# Patient Record
Sex: Male | Born: 1955 | Race: Black or African American | Hispanic: No | Marital: Single | Smoking: Current every day smoker
Health system: Southern US, Community
[De-identification: ages and names within clinical notes are randomized; demographics above are authoritative.]

## PROBLEM LIST (undated history)

## (undated) DIAGNOSIS — N189 Chronic kidney disease, unspecified: Secondary | ICD-10-CM

## (undated) DIAGNOSIS — G709 Myoneural disorder, unspecified: Secondary | ICD-10-CM

## (undated) DIAGNOSIS — IMO0001 Reserved for inherently not codable concepts without codable children: Secondary | ICD-10-CM

## (undated) DIAGNOSIS — Z9289 Personal history of other medical treatment: Secondary | ICD-10-CM

## (undated) DIAGNOSIS — I739 Peripheral vascular disease, unspecified: Secondary | ICD-10-CM

## (undated) DIAGNOSIS — I10 Essential (primary) hypertension: Secondary | ICD-10-CM

## (undated) DIAGNOSIS — E785 Hyperlipidemia, unspecified: Secondary | ICD-10-CM

---

## 1997-06-24 HISTORY — PX: HERNIA REPAIR: SHX51

## 2004-05-24 ENCOUNTER — Emergency Department: Payer: Self-pay | Admitting: Emergency Medicine

## 2004-05-24 ENCOUNTER — Other Ambulatory Visit: Payer: Self-pay

## 2005-06-05 ENCOUNTER — Emergency Department: Payer: Self-pay | Admitting: Emergency Medicine

## 2005-06-06 ENCOUNTER — Ambulatory Visit: Payer: Self-pay | Admitting: Emergency Medicine

## 2005-12-16 ENCOUNTER — Emergency Department: Payer: Self-pay | Admitting: Emergency Medicine

## 2005-12-16 ENCOUNTER — Other Ambulatory Visit: Payer: Self-pay

## 2006-01-01 ENCOUNTER — Ambulatory Visit: Payer: Self-pay | Admitting: Internal Medicine

## 2006-02-09 ENCOUNTER — Emergency Department: Payer: Self-pay | Admitting: Emergency Medicine

## 2006-05-08 ENCOUNTER — Emergency Department: Payer: Self-pay | Admitting: Emergency Medicine

## 2006-09-09 ENCOUNTER — Ambulatory Visit: Payer: Self-pay | Admitting: General Practice

## 2006-12-27 ENCOUNTER — Inpatient Hospital Stay: Payer: Self-pay | Admitting: Internal Medicine

## 2006-12-27 ENCOUNTER — Other Ambulatory Visit: Payer: Self-pay

## 2013-05-06 ENCOUNTER — Emergency Department: Payer: Self-pay | Admitting: Emergency Medicine

## 2013-06-23 ENCOUNTER — Emergency Department: Payer: Self-pay | Admitting: Emergency Medicine

## 2013-11-04 ENCOUNTER — Emergency Department: Payer: Self-pay | Admitting: Emergency Medicine

## 2015-08-31 ENCOUNTER — Ambulatory Visit
Admission: RE | Admit: 2015-08-31 | Discharge: 2015-08-31 | Disposition: A | Discharge status: Home / Self Care | Payer: BLUE CROSS/BLUE SHIELD | Source: Ambulatory Visit | Attending: Family Medicine | Admitting: Family Medicine

## 2015-08-31 ENCOUNTER — Other Ambulatory Visit: Payer: Self-pay | Admitting: Family Medicine

## 2015-08-31 ENCOUNTER — Ambulatory Visit (INDEPENDENT_AMBULATORY_CARE_PROVIDER_SITE_OTHER): Payer: BLUE CROSS/BLUE SHIELD | Admitting: Family Medicine

## 2015-08-31 ENCOUNTER — Encounter: Payer: Self-pay | Admitting: Family Medicine

## 2015-08-31 VITALS — BP 132/82 | HR 81 | Temp 97.6°F | Resp 14 | Ht 69.25 in | Wt 130.2 lb

## 2015-08-31 DIAGNOSIS — Z125 Encounter for screening for malignant neoplasm of prostate: Secondary | ICD-10-CM

## 2015-08-31 DIAGNOSIS — Z720 Tobacco use: Secondary | ICD-10-CM

## 2015-08-31 DIAGNOSIS — Z79899 Other long term (current) drug therapy: Secondary | ICD-10-CM | POA: Diagnosis not present

## 2015-08-31 DIAGNOSIS — J449 Chronic obstructive pulmonary disease, unspecified: Secondary | ICD-10-CM | POA: Insufficient documentation

## 2015-08-31 DIAGNOSIS — R062 Wheezing: Secondary | ICD-10-CM

## 2015-08-31 DIAGNOSIS — I10 Essential (primary) hypertension: Secondary | ICD-10-CM | POA: Diagnosis not present

## 2015-08-31 DIAGNOSIS — Z114 Encounter for screening for human immunodeficiency virus [HIV]: Secondary | ICD-10-CM

## 2015-08-31 DIAGNOSIS — Z13220 Encounter for screening for lipoid disorders: Secondary | ICD-10-CM | POA: Diagnosis not present

## 2015-08-31 DIAGNOSIS — Z1159 Encounter for screening for other viral diseases: Secondary | ICD-10-CM | POA: Diagnosis not present

## 2015-08-31 DIAGNOSIS — Z1211 Encounter for screening for malignant neoplasm of colon: Secondary | ICD-10-CM

## 2015-08-31 DIAGNOSIS — R9389 Abnormal findings on diagnostic imaging of other specified body structures: Secondary | ICD-10-CM

## 2015-08-31 MED ORDER — LISINOPRIL 20 MG PO TABS: 20.0000 mg | ORAL_TABLET | Freq: Every day | ORAL | Status: DC

## 2015-08-31 MED ORDER — ALBUTEROL SULFATE (2.5 MG/3ML) 0.083% IN NEBU
2.5000 mg | INHALATION_SOLUTION | Freq: Once | RESPIRATORY_TRACT | Status: AC
  Administered 2015-08-31: 2.5 mg via RESPIRATORY_TRACT

## 2015-08-31 MED ORDER — UMECLIDINIUM-VILANTEROL 62.5-25 MCG/INH IN AEPB: 1.0000 | INHALATION_SPRAY | Freq: Every day | RESPIRATORY_TRACT | Status: DC

## 2015-08-31 MED ORDER — ASPIRIN EC 81 MG PO TBEC: 81.0000 mg | DELAYED_RELEASE_TABLET | Freq: Every day | ORAL | Status: DC

## 2015-08-31 MED ORDER — MOXIFLOXACIN HCL 400 MG PO TABS: 400.0000 mg | ORAL_TABLET | Freq: Every day | ORAL | Status: DC

## 2015-08-31 NOTE — Progress Notes (Signed)
Name: Ryan [name]   MRN: [id number]    DOB: 02/12/1956   Date:08/31/2015       Progress Note  Subjective  Chief Complaint  Chief Complaint  Patient presents with  . Hypertension  . Colonoscopy    Wants colonoscopy referral.    HPI  HTN: he has been out of bp medication for a couple of months, he states he knows when bp goes up because he gets dizzy. No chest pain or palpitation.   Wheezing: wife has noticed some wheezing at night, no cough or SOB. He has been a smoker for 30 years, he used to smoke heavier but is down to a quarter pack daily. He is willing to try quitting. He will try nicotine gum   Patient Active Problem List   Diagnosis Date Noted  . Hypertension, benign 08/31/2015  . Tobacco use 08/31/2015    History reviewed. No pertinent past surgical history.  Family History  Problem Relation Age of Onset  . COPD Mother   . Hypertension Father     Social History   Social History  . Marital Status: Married    Spouse Name: N/A  . Number of Children: N/A  . Years of Education: N/A   Occupational History  . Not on file.   Social History Main Topics  . Smoking status: Current Every Day Smoker -- 0.25 packs/day for 30 years    Types: Cigarettes  . Smokeless tobacco: Not on file     Comment: 4-5 per day  . Alcohol Use: 7.2 oz/week    0 Standard drinks or equivalent, 12 Cans of beer per week  . Drug Use: Not on file  . Sexual Activity: Yes   Other Topics Concern  . Not on file   Social History Narrative  . No narrative on file     Current outpatient prescriptions:  .  aspirin EC 81 MG tablet, Take 1 tablet (81 mg total) by mouth daily., Disp: 30 tablet, Rfl: 0 .  lisinopril (PRINIVIL,ZESTRIL) 20 MG tablet, Take 1 tablet (20 mg total) by mouth daily., Disp: 90 tablet, Rfl: 1  No Known Allergies   ROS  Constitutional: Negative for fever or weight change.  Respiratory: Negative for cough and shortness of breath.   Cardiovascular: Negative for  chest pain or palpitations.  Gastrointestinal: Negative for abdominal pain, no bowel changes.  Musculoskeletal: Negative for gait problem or joint swelling.  Skin: Negative for rash.  Neurological: Negative for dizziness or headache.  No other specific complaints in a complete review of systems (except as listed in HPI above).   Objective  Filed Vitals:   08/31/15 0805  BP: 132/82  Pulse: 81  Temp: 97.6 F (36.4 C)  TempSrc: Oral  Resp: 14  Height: 5\' 9"  (1.753 m)  Weight: 130 lb 3.2 oz (59.058 kg)  SpO2: 98%    Body mass index is 19.22 kg/(m^2).  Physical Exam  Constitutional: Patient appears well-developed and thin No distress.  HEENT: head atraumatic, normocephalic, pupils equal and reactive to light, neck supple, throat within normal limits Cardiovascular: Normal rate, regular rhythm and normal heart sounds.  No murmur heard. No BLE edema. Pulmonary/Chest: Effort normal and breath sounds normal. No respiratory distress. Abdominal: Soft.  There is no tenderness. Psychiatric: Patient has a normal mood and affect. behavior is normal. Judgment and thought content normal.  PHQ2/9: Depression screen PHQ 2/9 08/31/2015  Decreased Interest 0  Down, Depressed, Hopeless 0  PHQ - 2 Score 0  Fall Risk: Fall Risk  08/31/2015  Falls in the past year? No    Functional Status Survey: Is the patient deaf or have difficulty hearing?: No Does the patient have difficulty seeing, even when wearing glasses/contacts?: No Does the patient have difficulty concentrating, remembering, or making decisions?: No Does the patient have difficulty walking or climbing stairs?: No Does the patient have difficulty dressing or bathing?: No Does the patient have difficulty doing errands alone such as visiting a doctor's office or shopping?: No    Assessment & Plan  1. Hypertension, benign  - Comprehensive metabolic panel - lisinopril (PRINIVIL,ZESTRIL) 20 MG tablet; Take 1 tablet (20 mg  total) by mouth daily.  Dispense: 90 tablet; Refill: 1 - aspirin EC 81 MG tablet; Take 1 tablet (81 mg total) by mouth daily.  Dispense: 30 tablet; Refill: 0 - CBC with Differential/Platelet  2. Tobacco use  He will try nicotine gum   3. Encounter for screening for HIV  - HIV antibody  4. Long-term use of high-risk medication  - Comprehensive metabolic panel - CBC with Differential/Platelet  5. Colon cancer screening  - Ambulatory referral to Gastroenterology  6. Lipid screening  - Lipid panel  7. Prostate cancer screening  - PSA  8. Need for hepatitis C screening test  - Hepatitis C antibody  9. Wheezing  - Spirometry: Pre & Post Eval - albuterol (PROVENTIL) (2.5 MG/3ML) 0.083% nebulizer solution 2.5 mg; Take 3 mLs (2.5 mg total) by nebulization once. - DG Chest 2 View; Future  10. Chronic obstructive pulmonary disease, unspecified COPD type ([place])  - DG Chest 2 View; Future - umeclidinium-vilanterol (ANORO ELLIPTA) 62.5-25 MCG/INH AEPB; Inhale 1 puff into the lungs daily.  Dispense: 60 each; Refill: 5

## 2015-08-31 NOTE — Patient Instructions (Signed)
Chronic Obstructive Pulmonary Disease Chronic obstructive pulmonary disease (COPD) is a common lung condition in which airflow from the lungs is limited. COPD is a general term that can be used to describe many different lung problems that limit airflow, including both chronic bronchitis and emphysema. If you have COPD, your lung function will probably never return to normal, but there are measures you can take to improve lung function and make yourself feel better. CAUSES   Smoking (common).  Exposure to secondhand smoke.  Genetic problems.  Chronic inflammatory lung diseases or recurrent infections. SYMPTOMS  Shortness of breath, especially with physical activity.  Deep, persistent (chronic) cough with a large amount of thick mucus.  Wheezing.  Rapid breaths (tachypnea).  Gray or bluish discoloration (cyanosis) of the skin, especially in your fingers, toes, or lips.  Fatigue.  Weight loss.  Frequent infections or episodes when breathing symptoms become much worse (exacerbations).  Chest tightness. DIAGNOSIS Your health care provider will take a medical history and perform a physical examination to diagnose COPD. Additional tests for COPD may include:  Lung (pulmonary) function tests.  Chest X-ray.  CT scan.  Blood tests. TREATMENT  Treatment for COPD may include:  Inhaler and nebulizer medicines. These help manage the symptoms of COPD and make your breathing more comfortable.  Supplemental oxygen. Supplemental oxygen is only helpful if you have a low oxygen level in your blood.  Exercise and physical activity. These are beneficial for nearly all people with COPD.  Lung surgery or transplant.  Nutrition therapy to gain weight, if you are underweight.  Pulmonary rehabilitation. This may involve working with a team of health care providers and specialists, such as respiratory, occupational, and physical therapists. HOME CARE INSTRUCTIONS  Take all medicines  (inhaled or pills) as directed by your health care provider.  Avoid over-the-counter medicines or cough syrups that dry up your airway (such as antihistamines) and slow down the elimination of secretions unless instructed otherwise by your health care provider.  If you are a smoker, the most important thing that you can do is stop smoking. Continuing to smoke will cause further lung damage and breathing trouble. Ask your health care provider for help with quitting smoking. He or she can direct you to community resources or hospitals that provide support.  Avoid exposure to irritants such as smoke, chemicals, and fumes that aggravate your breathing.  Use oxygen therapy and pulmonary rehabilitation if directed by your health care provider. If you require home oxygen therapy, ask your health care provider whether you should purchase a pulse oximeter to measure your oxygen level at home.  Avoid contact with individuals who have a contagious illness.  Avoid extreme temperature and humidity changes.  Eat healthy foods. Eating smaller, more frequent meals and resting before meals may help you maintain your strength.  Stay active, but balance activity with periods of rest. Exercise and physical activity will help you maintain your ability to do things you want to do.  Preventing infection and hospitalization is very important when you have COPD. Make sure to receive all the vaccines your health care provider recommends, especially the pneumococcal and influenza vaccines. Ask your health care provider whether you need a pneumonia vaccine.  Learn and use relaxation techniques to manage stress.  Learn and use controlled breathing techniques as directed by your health care provider. Controlled breathing techniques include:  Pursed lip breathing. Start by breathing in (inhaling) through your nose for 1 second. Then, purse your lips as if you were   going to whistle and breathe out (exhale) through the  pursed lips for 2 seconds.  Diaphragmatic breathing. Start by putting one hand on your abdomen just above your waist. Inhale slowly through your nose. The hand on your abdomen should move out. Then purse your lips and exhale slowly. You should be able to feel the hand on your abdomen moving in as you exhale.  Learn and use controlled coughing to clear mucus from your lungs. Controlled coughing is a series of short, progressive coughs. The steps of controlled coughing are: 1. Lean your head slightly forward. 2. Breathe in deeply using diaphragmatic breathing. 3. Try to hold your breath for 3 seconds. 4. Keep your mouth slightly open while coughing twice. 5. Spit any mucus out into a tissue. 6. Rest and repeat the steps once or twice as needed. SEEK MEDICAL CARE IF:  You are coughing up more mucus than usual.  There is a change in the color or thickness of your mucus.  Your breathing is more labored than usual.  Your breathing is faster than usual. SEEK IMMEDIATE MEDICAL CARE IF:  You have shortness of breath while you are resting.  You have shortness of breath that prevents you from:  Being able to talk.  Performing your usual physical activities.  You have chest pain lasting longer than 5 minutes.  Your skin color is more cyanotic than usual.  You measure low oxygen saturations for longer than 5 minutes with a pulse oximeter. MAKE SURE YOU:  Understand these instructions.  Will watch your condition.  Will get help right away if you are not doing well or get worse.   This information is not intended to replace advice given to you by your health care provider. Make sure you discuss any questions you have with your health care provider.   Document Released: 03/20/2005 Document Revised: 07/01/2014 Document Reviewed: 02/04/2013 Elsevier Interactive Patient Education 2016 [employer] Inc[employer]  

## 2015-09-01 ENCOUNTER — Other Ambulatory Visit: Payer: Self-pay | Admitting: Family Medicine

## 2015-09-01 ENCOUNTER — Telehealth: Payer: Self-pay | Admitting: Family Medicine

## 2015-09-01 DIAGNOSIS — R9389 Abnormal findings on diagnostic imaging of other specified body structures: Secondary | ICD-10-CM

## 2015-09-01 LAB — CBC WITH DIFFERENTIAL/PLATELET
BASOS ABS: 0 10*3/uL (ref 0.0–0.2)
Basos: 1 %
EOS (ABSOLUTE): 0.1 10*3/uL (ref 0.0–0.4)
EOS: 2 %
HEMOGLOBIN: 13.1 g/dL (ref 12.6–17.7)
Hematocrit: 38.5 % (ref 37.5–51.0)
IMMATURE GRANS (ABS): 0 10*3/uL (ref 0.0–0.1)
Immature Granulocytes: 0 %
LYMPHS: 54 %
Lymphocytes Absolute: 1.5 10*3/uL (ref 0.7–3.1)
MCH: 31.8 pg (ref 26.6–33.0)
MCHC: 34 g/dL (ref 31.5–35.7)
MCV: 93 fL (ref 79–97)
MONOCYTES: 9 %
Monocytes Absolute: 0.3 10*3/uL (ref 0.1–0.9)
Neutrophils Absolute: 1 10*3/uL — ABNORMAL LOW (ref 1.4–7.0)
Neutrophils: 34 %
PLATELETS: 217 10*3/uL (ref 150–379)
RBC: 4.12 x10E6/uL — AB (ref 4.14–5.80)
RDW: 12.8 % (ref 12.3–15.4)
WBC: 2.9 10*3/uL — AB (ref 3.4–10.8)

## 2015-09-01 LAB — LIPID PANEL
CHOL/HDL RATIO: 1.8 ratio (ref 0.0–5.0)
Cholesterol, Total: 180 mg/dL (ref 100–199)
HDL: 98 mg/dL (ref >39)
LDL Calculated: 57 mg/dL (ref 0–99)
Triglycerides: 127 mg/dL (ref 0–149)
VLDL Cholesterol Cal: 25 mg/dL (ref 5–40)

## 2015-09-01 LAB — COMPREHENSIVE METABOLIC PANEL
A/G RATIO: 1.5 (ref 1.1–2.5)
ALBUMIN: 4.4 g/dL (ref 3.5–5.5)
ALK PHOS: 68 IU/L (ref 39–117)
ALT: 19 IU/L (ref 0–44)
AST: 32 IU/L (ref 0–40)
BUN/Creatinine Ratio: 12 (ref 9–20)
BUN: 11 mg/dL (ref 6–24)
Bilirubin Total: 0.4 mg/dL (ref 0.0–1.2)
CALCIUM: 9 mg/dL (ref 8.7–10.2)
CO2: 23 mmol/L (ref 18–29)
Chloride: 97 mmol/L (ref 96–106)
Creatinine, Ser: 0.91 mg/dL (ref 0.76–1.27)
GFR calc Af Amer: 107 mL/min/{1.73_m2} (ref >59)
GFR, EST NON AFRICAN AMERICAN: 93 mL/min/{1.73_m2} (ref >59)
GLUCOSE: 83 mg/dL (ref 65–99)
Globulin, Total: 3 g/dL (ref 1.5–4.5)
POTASSIUM: 4.3 mmol/L (ref 3.5–5.2)
Sodium: 137 mmol/L (ref 134–144)
TOTAL PROTEIN: 7.4 g/dL (ref 6.0–8.5)

## 2015-09-01 LAB — PSA: PROSTATE SPECIFIC AG, SERUM: 0.6 ng/mL (ref 0.0–4.0)

## 2015-09-01 LAB — HEPATITIS C ANTIBODY

## 2015-09-01 LAB — HIV ANTIBODY (ROUTINE TESTING W REFLEX): HIV SCREEN 4TH GENERATION: NONREACTIVE

## 2015-09-03 NOTE — Telephone Encounter (Signed)
Message entered  

## 2015-10-09 ENCOUNTER — Other Ambulatory Visit: Payer: Self-pay | Admitting: Family Medicine

## 2015-10-09 ENCOUNTER — Telehealth: Payer: Self-pay

## 2015-10-09 DIAGNOSIS — R9389 Abnormal findings on diagnostic imaging of other specified body structures: Secondary | ICD-10-CM

## 2015-10-09 NOTE — Progress Notes (Signed)
Please call patient, he needs to have repeat CXR to evaluated for resolution of previous CXR done in March Ordered already in the system

## 2015-10-09 NOTE — Telephone Encounter (Signed)
Patient's wife was informed that an order was placed for him to have a repeat chest x-ray and that we will give him a call when we get those results. She stated that she will let him know.

## 2015-11-09 ENCOUNTER — Telehealth: Payer: Self-pay | Admitting: Gastroenterology

## 2015-11-09 NOTE — Telephone Encounter (Signed)
colonoscopy

## 2015-12-01 NOTE — Telephone Encounter (Signed)
LVM for pt to return my call.

## 2015-12-08 ENCOUNTER — Other Ambulatory Visit: Payer: Self-pay

## 2015-12-08 ENCOUNTER — Telehealth: Payer: Self-pay

## 2015-12-08 NOTE — Telephone Encounter (Signed)
Pt scheduled for screening colonoscopy at [facility] on 12/29/15. Please precert

## 2015-12-08 NOTE — Telephone Encounter (Signed)
Gastroenterology Pre-Procedure Review  Request Date: 12/29/15 Requesting Physician: Dr. Ancil Boozer  PATIENT REVIEW QUESTIONS: The patient responded to the following health history questions as indicated:    1. Are you having any GI issues? no 2. Do you have a personal history of Polyps? no 3. Do you have a family history of Colon Cancer or Polyps? no 4. Diabetes Mellitus? no 5. Joint replacements in the past 12 months?no 6. Major health problems in the past 3 months?no 7. Any artificial heart valves, MVP, or defibrillator?no    MEDICATIONS & ALLERGIES:    Patient reports the following regarding taking any anticoagulation/antiplatelet therapy:   Plavix, Coumadin, Eliquis, Xarelto, Lovenox, Pradaxa, Brilinta, or Effient? no Aspirin? yes (ASA 81mg )  Patient confirms/reports the following medications:  Current Outpatient Prescriptions  Medication Sig Dispense Refill  . aspirin EC 81 MG tablet Take 1 tablet (81 mg total) by mouth daily. 30 tablet 0  . lisinopril (PRINIVIL,ZESTRIL) 20 MG tablet Take 1 tablet (20 mg total) by mouth daily. 90 tablet 1  . moxifloxacin (AVELOX) 400 MG tablet Take 1 tablet (400 mg total) by mouth daily. 7 tablet 0  . umeclidinium-vilanterol (ANORO ELLIPTA) 62.5-25 MCG/INH AEPB Inhale 1 puff into the lungs daily. 60 each 5   No current facility-administered medications for this visit.    Patient confirms/reports the following allergies:  No Known Allergies  No orders of the defined types were placed in this encounter.    AUTHORIZATION INFORMATION Primary Insurance: 1D#: Group #:  Secondary Insurance: 1D#: Group #:  SCHEDULE INFORMATION: Date:  Time: Location:

## 2015-12-28 ENCOUNTER — Telehealth: Payer: Self-pay | Admitting: Gastroenterology

## 2015-12-28 ENCOUNTER — Other Ambulatory Visit: Payer: Self-pay

## 2015-12-28 NOTE — Telephone Encounter (Signed)
Patient cancelled colonoscopy for 7/7 in [place] and would like you to call and reschudule. The surgery center took it out

## 2015-12-28 NOTE — Discharge Instructions (Signed)

## 2015-12-29 ENCOUNTER — Ambulatory Visit
Admission: RE | Admit: 2015-12-29 | Payer: BLUE CROSS/BLUE SHIELD | Source: Ambulatory Visit | Admitting: Gastroenterology

## 2015-12-29 ENCOUNTER — Encounter: Admission: RE | Payer: Self-pay | Source: Ambulatory Visit

## 2015-12-29 SURGERY — COLONOSCOPY WITH PROPOFOL
Anesthesia: Choice

## 2015-12-29 NOTE — Telephone Encounter (Signed)
Pt rescheduled for colonoscopy on 01/11/16. [place] notified.

## 2016-01-04 ENCOUNTER — Encounter: Payer: Self-pay | Admitting: Anesthesiology

## 2016-01-10 ENCOUNTER — Telehealth: Payer: Self-pay

## 2016-01-10 ENCOUNTER — Other Ambulatory Visit: Payer: Self-pay

## 2016-01-10 NOTE — Telephone Encounter (Signed)
Patient previously canceled his colonoscopy. I added the orders back in and scheduled him for 02/15/2016 at the [facility].

## 2016-01-11 ENCOUNTER — Ambulatory Visit
Admission: RE | Admit: 2016-01-11 | Payer: BLUE CROSS/BLUE SHIELD | Source: Ambulatory Visit | Admitting: Gastroenterology

## 2016-01-11 HISTORY — DX: Essential (primary) hypertension: I10

## 2016-01-11 SURGERY — COLONOSCOPY WITH PROPOFOL
Anesthesia: Choice

## 2016-02-15 ENCOUNTER — Ambulatory Visit: Payer: BLUE CROSS/BLUE SHIELD | Admitting: Anesthesiology

## 2016-02-15 ENCOUNTER — Encounter
Admission: RE | Disposition: A | Discharge status: Home / Self Care | Payer: Self-pay | Source: Ambulatory Visit | Attending: Gastroenterology

## 2016-02-15 ENCOUNTER — Ambulatory Visit
Admission: RE | Admit: 2016-02-15 | Discharge: 2016-02-15 | Disposition: A | Discharge status: Home / Self Care | Payer: BLUE CROSS/BLUE SHIELD | Source: Ambulatory Visit | Attending: Gastroenterology | Admitting: Gastroenterology

## 2016-02-15 DIAGNOSIS — F17210 Nicotine dependence, cigarettes, uncomplicated: Secondary | ICD-10-CM | POA: Diagnosis not present

## 2016-02-15 DIAGNOSIS — Z79899 Other long term (current) drug therapy: Secondary | ICD-10-CM | POA: Diagnosis not present

## 2016-02-15 DIAGNOSIS — Z825 Family history of asthma and other chronic lower respiratory diseases: Secondary | ICD-10-CM | POA: Diagnosis not present

## 2016-02-15 DIAGNOSIS — Z1211 Encounter for screening for malignant neoplasm of colon: Secondary | ICD-10-CM | POA: Diagnosis not present

## 2016-02-15 DIAGNOSIS — Z9889 Other specified postprocedural states: Secondary | ICD-10-CM | POA: Insufficient documentation

## 2016-02-15 DIAGNOSIS — I10 Essential (primary) hypertension: Secondary | ICD-10-CM | POA: Insufficient documentation

## 2016-02-15 DIAGNOSIS — D123 Benign neoplasm of transverse colon: Secondary | ICD-10-CM | POA: Diagnosis not present

## 2016-02-15 DIAGNOSIS — Z716 Tobacco abuse counseling: Secondary | ICD-10-CM

## 2016-02-15 DIAGNOSIS — K642 Third degree hemorrhoids: Secondary | ICD-10-CM | POA: Insufficient documentation

## 2016-02-15 DIAGNOSIS — D120 Benign neoplasm of cecum: Secondary | ICD-10-CM | POA: Diagnosis not present

## 2016-02-15 DIAGNOSIS — D125 Benign neoplasm of sigmoid colon: Secondary | ICD-10-CM | POA: Diagnosis not present

## 2016-02-15 DIAGNOSIS — Z7982 Long term (current) use of aspirin: Secondary | ICD-10-CM | POA: Insufficient documentation

## 2016-02-15 DIAGNOSIS — Z8249 Family history of ischemic heart disease and other diseases of the circulatory system: Secondary | ICD-10-CM | POA: Diagnosis not present

## 2016-02-15 DIAGNOSIS — G709 Myoneural disorder, unspecified: Secondary | ICD-10-CM | POA: Diagnosis not present

## 2016-02-15 HISTORY — DX: Myoneural disorder, unspecified: G70.9

## 2016-02-15 HISTORY — DX: Reserved for inherently not codable concepts without codable children: IMO0001

## 2016-02-15 HISTORY — PX: COLONOSCOPY WITH PROPOFOL: SHX5780

## 2016-02-15 HISTORY — PX: POLYPECTOMY: SHX5525

## 2016-02-15 SURGERY — COLONOSCOPY WITH PROPOFOL
Anesthesia: Monitor Anesthesia Care | Site: Rectum | Wound class: Contaminated

## 2016-02-15 MED ORDER — PROPOFOL 10 MG/ML IV BOLUS
INTRAVENOUS | Status: DC | PRN
  Administered 2016-02-15: 50 mg via INTRAVENOUS
  Administered 2016-02-15 (×5): 10 mg via INTRAVENOUS

## 2016-02-15 MED ORDER — LIDOCAINE HCL (CARDIAC) 20 MG/ML IV SOLN
INTRAVENOUS | Status: DC | PRN
  Administered 2016-02-15: 50 mg via INTRAVENOUS

## 2016-02-15 MED ORDER — STERILE WATER FOR IRRIGATION IR SOLN
Status: DC | PRN
  Administered 2016-02-15: 09:00:00

## 2016-02-15 MED ORDER — LACTATED RINGERS IV SOLN
INTRAVENOUS | Status: DC | PRN
  Administered 2016-02-15: 09:00:00 via INTRAVENOUS

## 2016-02-15 SURGICAL SUPPLY — 23 items
CANISTER SUCT 1200ML W/VALVE (MISCELLANEOUS) ×2 IMPLANT
CLIP HMST 235XBRD CATH ROT (MISCELLANEOUS) IMPLANT
CLIP RESOLUTION 360 11X235 (MISCELLANEOUS)
FCP ESCP3.2XJMB 240X2.8X (MISCELLANEOUS)
FORCEPS BIOP RAD 4 LRG CAP 4 (CUTTING FORCEPS) ×2 IMPLANT
FORCEPS BIOP RJ4 240 W/NDL (MISCELLANEOUS)
FORCEPS ESCP3.2XJMB 240X2.8X (MISCELLANEOUS) IMPLANT
GOWN CVR UNV OPN BCK APRN NK (MISCELLANEOUS) ×2 IMPLANT
GOWN ISOL THUMB LOOP REG UNIV (MISCELLANEOUS) ×2
INJECTOR VARIJECT VIN23 (MISCELLANEOUS) IMPLANT
KIT DEFENDO VALVE AND CONN (KITS) IMPLANT
KIT ENDO PROCEDURE OLY (KITS) ×2 IMPLANT
MARKER SPOT ENDO TATTOO 5ML (MISCELLANEOUS) IMPLANT
PAD GROUND ADULT SPLIT (MISCELLANEOUS) IMPLANT
PROBE APC STR FIRE (PROBE) IMPLANT
RETRIEVER NET ROTH 2.5X230 LF (MISCELLANEOUS) ×2 IMPLANT
SNARE SHORT THROW 13M SML OVAL (MISCELLANEOUS) IMPLANT
SNARE SHORT THROW 30M LRG OVAL (MISCELLANEOUS) IMPLANT
SNARE SNG USE RND 15MM (INSTRUMENTS) IMPLANT
SPOT EX ENDOSCOPIC TATTOO (MISCELLANEOUS)
TRAP ETRAP POLY (MISCELLANEOUS) IMPLANT
VARIJECT INJECTOR VIN23 (MISCELLANEOUS)
WATER STERILE IRR 250ML POUR (IV SOLUTION) ×2 IMPLANT

## 2016-02-15 NOTE — Transfer of Care (Signed)
Immediate Anesthesia Transfer of Care Note  Patient: Ryan [name]  Procedure(s) Performed: Procedure(s): COLONOSCOPY WITH PROPOFOL (N/A) POLYPECTOMY (N/A)  Patient Location: PACU  Anesthesia Type: MAC  Level of Consciousness: awake, alert  and patient cooperative  Airway and Oxygen Therapy: Patient Spontanous Breathing and Patient connected to supplemental oxygen  Post-op Assessment: Post-op Vital signs reviewed, Patient's Cardiovascular Status Stable, Respiratory Function Stable, Patent Airway and No signs of Nausea or vomiting  Post-op Vital Signs: Reviewed and stable  Complications: No apparent anesthesia complications

## 2016-02-15 NOTE — Anesthesia Procedure Notes (Signed)
Procedure Name: MAC Performed by: Dalena Plantz Pre-anesthesia Checklist: Patient identified, Emergency Drugs available, Suction available, Timeout performed and Patient being monitored Patient Re-evaluated:Patient Re-evaluated prior to inductionOxygen Delivery Method: Nasal cannula Placement Confirmation: positive ETCO2       

## 2016-02-15 NOTE — Anesthesia Postprocedure Evaluation (Signed)
Anesthesia Post Note  Patient: Ryan [name]  Procedure(s) Performed: Procedure(s) (LRB): COLONOSCOPY WITH PROPOFOL (N/A) POLYPECTOMY (N/A)  Patient location during evaluation: PACU Anesthesia Type: MAC Level of consciousness: awake and alert Pain management: pain level controlled Vital Signs Assessment: post-procedure vital signs reviewed and stable Respiratory status: spontaneous breathing, nonlabored ventilation, respiratory function stable and patient connected to nasal cannula oxygen Cardiovascular status: stable and blood pressure returned to baseline Anesthetic complications: no    Irvine Glorioso C

## 2016-02-15 NOTE — H&P (Signed)
  Ryan Lame, MD [facility] [address] [place], [place] [address] Phone: [phone] Fax : [phone]  Primary Care Physician:  Ryan Chance, MD Primary Gastroenterologist:  Dr. Allen [name]  Pre-Procedure History & Physical: HPI:  Ryan [name] is a 60 y.o. male is here for a screening colonoscopy.   Past Medical History:  Diagnosis Date  . Hypertension   . Neuromuscular disorder (HCC)    numbness feet  . Shortness of breath dyspnea     Past Surgical History:  Procedure Laterality Date  . [place]    Prior to Admission medications   Medication Sig Start Date End Date Taking? Authorizing Provider  lisinopril (PRINIVIL,ZESTRIL) 20 MG tablet Take 1 tablet (20 mg total) by mouth daily. 08/31/15  Yes Ryan Sizer, MD  moxifloxacin (AVELOX) 400 MG tablet Take 1 tablet (400 mg total) by mouth daily. 08/31/15  Yes Ryan Sizer, MD  aspirin EC 81 MG tablet Take 1 tablet (81 mg total) by mouth daily. Patient not taking: Reported on 01/04/2016 08/31/15   Ryan Sizer, MD  umeclidinium-vilanterol [facility] ELLIPTA) 62.5-25 MCG/INH AEPB Inhale 1 puff into the lungs daily. 08/31/15   Ryan Sizer, MD    Allergies as of 01/10/2016  . (No Known Allergies)    Family History  Problem Relation Age of Onset  . COPD Mother   . Hypertension Father     Social History   Social History  . Marital status: Married    Spouse name: N/A  . Number of children: N/A  . Years of education: N/A   Occupational History  . Not on file.   Social History Main Topics  . Smoking status: Current Every Day Smoker    Packs/day: 0.25    Years: 30.00    Types: Cigarettes  . Smokeless tobacco: Never Used     Comment: 4-5 per day  . Alcohol use 5.4 oz/week    9 Cans of beer per week  . Drug use: Unknown  . Sexual activity: Yes   Other Topics Concern  . Not on file   Social History Narrative  . No narrative on file    Review of Systems: See HPI, otherwise negative  ROS  Physical Exam: BP 139/88   Pulse (!) 55   Temp 97.3 F (36.3 C) (Temporal)   Ht 5\' 8"  (1.727 m)   Wt 118 lb (53.5 kg)   SpO2 100%   BMI 17.94 kg/m  General:   Alert,  pleasant and cooperative in NAD Head:  Normocephalic and atraumatic. Neck:  Supple; no masses or thyromegaly. Lungs:  Clear throughout to auscultation.    Heart:  Regular rate and rhythm. Abdomen:  Soft, nontender and nondistended. Normal bowel sounds, without guarding, and without rebound.   Neurologic:  Alert and  oriented x4;  grossly normal neurologically.  Impression/Plan: Ryan [name] is now here to undergo a screening colonoscopy.  Risks, benefits, and alternatives regarding colonoscopy have been reviewed with the patient.  Questions have been answered.  All parties agreeable.

## 2016-02-15 NOTE — Discharge Instructions (Signed)

## 2016-02-15 NOTE — Anesthesia Preprocedure Evaluation (Signed)
Anesthesia Evaluation  Patient identified by MRN, date of birth, ID band Patient awake    Reviewed: Allergy & Precautions, NPO status , Patient's Chart, lab work & pertinent test results  Airway Mallampati: II  TM Distance: >3 FB Neck ROM: Full    Dental no notable dental hx. (+) Edentulous Upper,    Pulmonary neg pulmonary ROS, Current Smoker,    Pulmonary exam normal breath sounds clear to auscultation       Cardiovascular hypertension, Normal cardiovascular exam Rhythm:Regular Rate:Normal     Neuro/Psych  Neuromuscular disease negative psych ROS   GI/Hepatic negative GI ROS, Neg liver ROS,   Endo/Other  negative endocrine ROS  Renal/GU negative Renal ROS  negative genitourinary   Musculoskeletal negative musculoskeletal ROS (+)   Abdominal   Peds negative pediatric ROS (+)  Hematology negative hematology ROS (+)   Anesthesia Other Findings   Reproductive/Obstetrics negative OB ROS                             Anesthesia Physical Anesthesia Plan  ASA: II  Anesthesia Plan: MAC   Post-op Pain Management:    Induction: Intravenous  Airway Management Planned:   Additional Equipment:   Intra-op Plan:   Post-operative Plan: Extubation in OR  Informed Consent: I have reviewed the patients History and Physical, chart, labs and discussed the procedure including the risks, benefits and alternatives for the proposed anesthesia with the patient or authorized representative who has indicated his/her understanding and acceptance.   Dental advisory given  Plan Discussed with: CRNA  Anesthesia Plan Comments:         Anesthesia Quick Evaluation

## 2016-02-15 NOTE — Op Note (Signed)
[facility] Gastroenterology Patient Name: Ryan [name] Procedure Date: 02/15/2016 8:34 AM MRN: [id number] Account #: [id number] Date of Birth: 1956/01/31 Admit Type: Outpatient Age: 60 Room: [facility] OR ROOM 01 Gender: Male Note Status: Finalized Procedure:            Colonoscopy Indications:          Screening for colorectal malignant neoplasm Providers:            Lucilla Lame MD, MD Referring MD:         Bethena Roys. Sowles, MD (Referring MD) Medicines:            Propofol per Anesthesia Complications:        No immediate complications. Procedure:            Pre-Anesthesia Assessment:                       - Prior to the procedure, a History and Physical was                        performed, and patient medications and allergies were                        reviewed. The patient's tolerance of previous                        anesthesia was also reviewed. The risks and benefits of                        the procedure and the sedation options and risks were                        discussed with the patient. All questions were                        answered, and informed consent was obtained. Prior                        Anticoagulants: The patient has taken no previous                        anticoagulant or antiplatelet agents. ASA Grade                        Assessment: II - A patient with mild systemic disease.                        After reviewing the risks and benefits, the patient was                        deemed in satisfactory condition to undergo the                        procedure.                       After obtaining informed consent, the colonoscope was                        passed under direct vision. Throughout the procedure,  the patient's blood pressure, pulse, and oxygen                        saturations were monitored continuously. The Olympus                        CF-HQ190L Colonoscope (S#. [id number]) was introduced                       through the anus and advanced to the the cecum,                        identified by appendiceal orifice and ileocecal valve.                        The colonoscopy was performed without difficulty. The                        patient tolerated the procedure well. The quality of                        the bowel preparation was excellent. Findings:      The perianal and digital rectal examinations were normal.      A 3 mm polyp was found in the cecum. The polyp was sessile. The polyp       was removed with a cold biopsy forceps. Resection and retrieval were       complete.      A 4 mm polyp was found in the transverse colon. The polyp was sessile.       The polyp was removed with a cold biopsy forceps. Resection and       retrieval were complete.      Two sessile polyps were found in the sigmoid colon. The polyps were 3 to       4 mm in size. These polyps were removed with a cold biopsy forceps.       Resection and retrieval were complete.      Non-bleeding internal hemorrhoids were found during retroflexion. The       hemorrhoids were Grade III (internal hemorrhoids that prolapse but       require manual reduction). Impression:           - One 3 mm polyp in the cecum, removed with a cold                        biopsy forceps. Resected and retrieved.                       - One 4 mm polyp in the transverse colon, removed with                        a cold biopsy forceps. Resected and retrieved.                       - Two 3 to 4 mm polyps in the sigmoid colon, removed                        with a cold biopsy forceps. Resected and retrieved.                       -  Non-bleeding internal hemorrhoids. Recommendation:       - Await pathology results.                       - Repeat colonoscopy in 5 years if polyp adenoma and 10                        years if hyperplastic Procedure Code(s):    --- Professional ---                       [phone], Colonoscopy, flexible; with  biopsy, single or                        multiple Diagnosis Code(s):    --- Professional ---                       Z12.11, Encounter for screening for malignant neoplasm                        of colon                       D12.0, Benign neoplasm of cecum                       D12.3, Benign neoplasm of transverse colon (hepatic                        flexure or splenic flexure)                       D12.5, Benign neoplasm of sigmoid colon CPT copyright 2016 American Medical Association. All rights reserved. The codes documented in this report are preliminary and upon coder review may  be revised to meet current compliance requirements. Lucilla Lame MD, MD 02/15/2016 8:51:42 AM This report has been signed electronically. Number of Addenda: 0 Note Initiated On: 02/15/2016 8:34 AM Scope Withdrawal Time: 0 hours 7 minutes 58 seconds  Total Procedure Duration: 0 hours 11 minutes 16 seconds       [facility]

## 2016-02-16 ENCOUNTER — Encounter: Payer: Self-pay | Admitting: Gastroenterology

## 2016-02-20 ENCOUNTER — Encounter: Payer: Self-pay | Admitting: Gastroenterology

## 2016-02-21 ENCOUNTER — Encounter: Payer: Self-pay | Admitting: Gastroenterology

## 2016-03-04 ENCOUNTER — Encounter: Payer: BLUE CROSS/BLUE SHIELD | Admitting: Family Medicine

## 2018-02-11 ENCOUNTER — Telehealth: Payer: Self-pay | Admitting: *Deleted

## 2018-02-11 ENCOUNTER — Ambulatory Visit (INDEPENDENT_AMBULATORY_CARE_PROVIDER_SITE_OTHER): Payer: BLUE CROSS/BLUE SHIELD | Admitting: Family Medicine

## 2018-02-11 ENCOUNTER — Encounter: Payer: Self-pay | Admitting: Family Medicine

## 2018-02-11 VITALS — BP 140/80 | HR 75 | Temp 97.7°F | Resp 18 | Ht 69.0 in | Wt 122.4 lb

## 2018-02-11 DIAGNOSIS — R5383 Other fatigue: Secondary | ICD-10-CM | POA: Diagnosis not present

## 2018-02-11 DIAGNOSIS — Z122 Encounter for screening for malignant neoplasm of respiratory organs: Secondary | ICD-10-CM

## 2018-02-11 DIAGNOSIS — R9389 Abnormal findings on diagnostic imaging of other specified body structures: Secondary | ICD-10-CM

## 2018-02-11 DIAGNOSIS — I10 Essential (primary) hypertension: Secondary | ICD-10-CM | POA: Diagnosis not present

## 2018-02-11 DIAGNOSIS — Z720 Tobacco use: Secondary | ICD-10-CM

## 2018-02-11 DIAGNOSIS — Z13220 Encounter for screening for lipoid disorders: Secondary | ICD-10-CM | POA: Diagnosis not present

## 2018-02-11 DIAGNOSIS — G4700 Insomnia, unspecified: Secondary | ICD-10-CM

## 2018-02-11 MED ORDER — HYDROXYZINE HCL 10 MG PO TABS
10.0000 mg | ORAL_TABLET | Freq: Every day | ORAL | 0 refills | Status: DC
Start: 2018-02-11 — End: 2018-03-12

## 2018-02-11 NOTE — Telephone Encounter (Signed)
Received a referral for initial lung cancer screening scan.  Contacted the patient and obtained their smoking history, current smoker for 45 years with a 33.75 pkyr history  as well as answering questions related to screening process.  Patient denies signs of lung cancer such as weight loss or hemoptysis at this time.  Patient denies comorbidity that would prevent curative treatment if lung cancer were found.  Patient is scheduled for the Shared Decision Making Visit and CT scan on 02-17-18@1000 .

## 2018-02-11 NOTE — Progress Notes (Signed)
Name: Ryan [name]   MRN: [id number]    DOB: 1956/05/19   Date:02/11/2018       Progress Note  Subjective  Chief Complaint  Chief Complaint  Patient presents with  . Fatigue    no energy, weak  . Insomnia    trouble falling aslepp and staying asleep. 3 hours a night    HPI  Pt presents for concern regarding insomnia and fatigue.  He has not been seen in over 2 years, but he used to see Dr. Ancil Boozer as his PCP.  Fatigue has been ongoing for several weeks, but he has had trouble sleeping for about a year.  Works second shift - getting to sleep around 2am and waking up at about 5am.    Endorses dizziness, occasional diarrhea, occasional headaches, and trouble falling asleep.  GAD-7 does show some mild anxiety - pt states he has to watch television to fall asleep or else his mind races too much and he can't relax.  PHQ-9 has score of 2.   Office Visit from 02/11/2018 in [facility]  PHQ-9 Total Score  2     GAD 7 : Generalized Anxiety Score 02/11/2018  Nervous, Anxious, on Edge 2  Control/stop worrying 0  Worry too much - different things 2  Trouble relaxing 0  Restless 0  Easily annoyed or irritable 0  Afraid - awful might happen 0  Total GAD 7 Score 4  Anxiety Difficulty Not difficult at all  Denies chest pain, shortness of breath, palpitations, hair/skin/nail changes, dizziness, near-syncope/syncope, abdominal pain, constipation, blood in stool, dark and tarry stool, recent travel, tick bites or rashes. - Colonoscopy is UTD - had in 2017, told to repeat in 5 years (2022). - BMI is low, he states he has always been on the thin side, no recent abnormal/unintentional weight loss.  - Tobacco use: He is currently a 1/2ppd smoker - has smoked for about 35 years and has ranged from 1ppd-1/2ppd throughout that time (had quit at one point, then started back); denies cough, shortness of breath. Did have abnormal CXR back in 2017, but never follow up for a repeat.  Counseled on cessation, pt declines today.  -HTN: He has a history of HTN, BP is upper limit of goal today.  He has been off of his medications for about 2 years; is willing to restart if needed. We will check labs and recheck BP at next visit - if above goal, may consider restarting meds.  He does not check BP at home, but will start checking a few times a week and bring in log.  Patient Active Problem List   Diagnosis Date Noted  . Special screening for malignant neoplasms, colon   . Benign neoplasm of cecum   . Benign neoplasm of transverse colon   . Benign neoplasm of sigmoid colon   . Hypertension, benign 08/31/2015  . Tobacco use 08/31/2015  . Abnormal CXR 08/31/2015   Past Surgical History:  Procedure Laterality Date  . COLONOSCOPY WITH PROPOFOL N/A 02/15/2016   Procedure: COLONOSCOPY WITH PROPOFOL;  Surgeon: Lucilla Lame, MD;  Location: [facility];  Service: Endoscopy;  Laterality: N/A;  . HERNIA REPAIR  1999  . POLYPECTOMY N/A 02/15/2016   Procedure: POLYPECTOMY;  Surgeon: Lucilla Lame, MD;  Location: [place];  Service: Endoscopy;  Laterality: N/A;    Family History  Problem Relation Age of Onset  . COPD Mother   . Hypertension Father     Social  History   Socioeconomic History  . Marital status: Married    Spouse name: Not on file  . Number of children: Not on file  . Years of education: Not on file  . Highest education level: Not on file  Occupational History  . Not on file  Social Needs  . Financial resource strain: Not on file  . Food insecurity:    Worry: Not on file    Inability: Not on file  . Transportation needs:    Medical: Not on file    Non-medical: Not on file  Tobacco Use  . Smoking status: Current Every Day Smoker    Packs/day: 0.25    Years: 30.00    Pack years: 7.50    Types: Cigarettes  . Smokeless tobacco: Never Used  . Tobacco comment: 4-5 per day  Substance and Sexual Activity  . Alcohol use: Yes    Alcohol/week:  9.0 standard drinks    Types: 9 Cans of beer per week  . Drug use: Not on file  . Sexual activity: Yes  Lifestyle  . Physical activity:    Days per week: Not on file    Minutes per session: Not on file  . Stress: Not on file  Relationships  . Social connections:    Talks on phone: Not on file    Gets together: Not on file    Attends religious service: Not on file    Active member of club or organization: Not on file    Attends meetings of clubs or organizations: Not on file    Relationship status: Not on file  . Intimate partner violence:    Fear of current or ex partner: Not on file    Emotionally abused: Not on file    Physically abused: Not on file    Forced sexual activity: Not on file  Other Topics Concern  . Not on file  Social History Narrative  . Not on file     Current Outpatient Medications:  .  aspirin EC 81 MG tablet, Take 1 tablet (81 mg total) by mouth daily. (Patient not taking: Reported on 01/04/2016), Disp: 30 tablet, Rfl: 0 .  lisinopril (PRINIVIL,ZESTRIL) 20 MG tablet, Take 1 tablet (20 mg total) by mouth daily. (Patient not taking: Reported on 02/11/2018), Disp: 90 tablet, Rfl: 1 .  moxifloxacin (AVELOX) 400 MG tablet, Take 1 tablet (400 mg total) by mouth daily. (Patient not taking: Reported on 02/11/2018), Disp: 7 tablet, Rfl: 0 .  umeclidinium-vilanterol (ANORO ELLIPTA) 62.5-25 MCG/INH AEPB, Inhale 1 puff into the lungs daily. (Patient not taking: Reported on 02/11/2018), Disp: 60 each, Rfl: 5  No Known Allergies  ROS Ten systems reviewed and is negative except as mentioned in HPI.  Objective  Vitals:   02/11/18 0918  BP: 140/80  Pulse: 75  Resp: 18  Temp: 97.7 F (36.5 C)  TempSrc: Oral  SpO2: 97%  Weight: 122 lb 6.4 oz (55.5 kg)  Height: 5\' 9"  (1.753 m)   Body mass index is 18.08 kg/m.  Physical Exam Constitutional: Patient appears well-developed and thin. No distress.  HENT: Head: Normocephalic and atraumatic. Ears: B TMs ok, no  erythema or effusion; Nose: Nose normal. Mouth/Throat: Oropharynx is clear and moist. No oropharyngeal exudate.  Eyes: Conjunctivae and EOM are normal. Pupils are equal, round, and reactive to light. No scleral icterus.  Neck: Normal range of motion. Neck supple. No JVD present. No thyromegaly present.  Cardiovascular: Normal rate, regular rhythm and normal heart sounds.  No murmur heard. No BLE edema. Pulmonary/Chest: Effort normal and breath sounds are clear, RUL is diminished. No respiratory distress. Abdominal: Soft. Bowel sounds are normal, no distension. There is no tenderness. no masses Musculoskeletal: Normal range of motion, no joint effusions. No gross deformities Neurological: he is alert and oriented to person, place, and time. No cranial nerve deficit. Coordination, balance, strength, speech and gait are normal.  Skin: Skin is warm and dry. No rash noted. No erythema.  Psychiatric: Patient has a normal mood and affect. behavior is normal. Judgment and thought content normal.  No results found for this or any previous visit (from the past 72 hour(s)).  PHQ2/9: Depression screen [facility] 2/9 02/11/2018 08/31/2015  Decreased Interest 0 0  Down, Depressed, Hopeless 0 0  PHQ - 2 Score 0 0  Altered sleeping 1 -  Tired, decreased energy 1 -  Change in appetite 0 -  Feeling bad or failure about yourself  0 -  Trouble concentrating 0 -  Moving slowly or fidgety/restless 0 -  Suicidal thoughts 0 -  PHQ-9 Score 2 -  Difficult doing work/chores Not difficult at all -   Fall Risk: Fall Risk  02/11/2018 08/31/2015  Falls in the past year? No No   Assessment & Plan  1. Fatigue, unspecified type - CBC w/Diff/Platelet - COMPLETE METABOLIC PANEL WITH GFR - TSH - Advised there are many possible causes of his increased fatigue - we will work to have lung cancer screening CT scan performed ASAP, and we will start with the labs as above.  I strongly recommend he return for follow up in 1 month with  PCP Dr. Ancil Boozer or myself.  He is agreeable. - Discussed proper nutrition, smoking cessation  2. Hypertension, benign - COMPLETE METABOLIC PANEL WITH GFR - BP at goal today, will not restart medications, advised to monitor 2 times a week and bring log in to next visit.  3. Tobacco use - CT CHEST LUNG CA SCREEN LOW DOSE W/O CM; Future  4. Abnormal CXR - CT CHEST LUNG CA SCREEN LOW DOSE W/O CM; Future  5. Encounter for screening for malignant neoplasm of respiratory organs - CT CHEST LUNG CA SCREEN LOW DOSE W/O CM; Future  6. Lipid screening - Lipid panel   7. Insomnia, unspecified type - Sleep Hygiene discussed in detail; he works second shift, which makes it difficult for him to wind down at night, we will trial hydroxyzine to help with increased anxiety/racing mind at night.   - hydrOXYzine (ATARAX/VISTARIL) 10 MG tablet; Take 1 tablet (10 mg total) by mouth at bedtime.  Dispense: 20 tablet; Refill: 0

## 2018-02-12 LAB — COMPLETE METABOLIC PANEL WITH GFR
AG Ratio: 1.3 (calc) (ref 1.0–2.5)
ALT: 10 U/L (ref 9–46)
AST: 19 U/L (ref 10–35)
Albumin: 3.9 g/dL (ref 3.6–5.1)
Alkaline phosphatase (APISO): 75 U/L (ref 40–115)
BUN/Creatinine Ratio: 12 (calc) (ref 6–22)
BUN: 16 mg/dL (ref 7–25)
CALCIUM: 8.7 mg/dL (ref 8.6–10.3)
CO2: 25 mmol/L (ref 20–32)
Chloride: 107 mmol/L (ref 98–110)
Creat: 1.33 mg/dL — ABNORMAL HIGH (ref 0.70–1.25)
GFR, EST AFRICAN AMERICAN: 67 mL/min/{1.73_m2} (ref >=60)
GFR, EST NON AFRICAN AMERICAN: 58 mL/min/{1.73_m2} — AB (ref >=60)
GLUCOSE: 80 mg/dL (ref 65–99)
Globulin: 2.9 g/dL (calc) (ref 1.9–3.7)
Potassium: 4.2 mmol/L (ref 3.5–5.3)
Sodium: 139 mmol/L (ref 135–146)
TOTAL PROTEIN: 6.8 g/dL (ref 6.1–8.1)
Total Bilirubin: 0.5 mg/dL (ref 0.2–1.2)

## 2018-02-12 LAB — CBC WITH DIFFERENTIAL/PLATELET
BASOS PCT: 0.7 %
Basophils Absolute: 29 cells/uL (ref 0–200)
EOS PCT: 1.7 %
Eosinophils Absolute: 70 cells/uL (ref 15–500)
HCT: 39.7 % (ref 38.5–50.0)
Hemoglobin: 13.5 g/dL (ref 13.2–17.1)
Lymphs Abs: 1259 cells/uL (ref 850–3900)
MCH: 32.4 pg (ref 27.0–33.0)
MCHC: 34 g/dL (ref 32.0–36.0)
MCV: 95.2 fL (ref 80.0–100.0)
MONOS PCT: 9.7 %
MPV: 10.1 fL (ref 7.5–12.5)
Neutro Abs: 2345 cells/uL (ref 1500–7800)
Neutrophils Relative %: 57.2 %
PLATELETS: 210 10*3/uL (ref 140–400)
RBC: 4.17 10*6/uL — AB (ref 4.20–5.80)
RDW: 12 % (ref 11.0–15.0)
TOTAL LYMPHOCYTE: 30.7 %
WBC: 4.1 10*3/uL (ref 3.8–10.8)
WBCMIX: 398 {cells}/uL (ref 200–950)

## 2018-02-12 LAB — LIPID PANEL
CHOL/HDL RATIO: 2.2 (calc) (ref <5.0)
CHOLESTEROL: 160 mg/dL (ref <200)
HDL: 74 mg/dL (ref >40)
LDL Cholesterol (Calc): 76 mg/dL (calc)
NON-HDL CHOLESTEROL (CALC): 86 mg/dL (ref <130)
TRIGLYCERIDES: 36 mg/dL (ref <150)

## 2018-02-12 LAB — TSH: TSH: 2.16 mIU/L (ref 0.40–4.50)

## 2018-02-16 ENCOUNTER — Telehealth: Payer: Self-pay | Admitting: *Deleted

## 2018-02-16 NOTE — Telephone Encounter (Signed)
Called patient to remind him of his appt for 02-17-18 at 1015 for lung screening, unable to reach at this time, message left for patient on voice mail.  Instructed to call [phone] for questions or changes.

## 2018-02-17 ENCOUNTER — Inpatient Hospital Stay: Payer: BLUE CROSS/BLUE SHIELD | Attending: Oncology | Admitting: Oncology

## 2018-02-17 ENCOUNTER — Encounter: Payer: Self-pay | Admitting: Oncology

## 2018-02-17 ENCOUNTER — Ambulatory Visit
Admission: RE | Admit: 2018-02-17 | Discharge: 2018-02-17 | Disposition: A | Discharge status: Home / Self Care | Payer: BLUE CROSS/BLUE SHIELD | Source: Ambulatory Visit | Attending: Oncology | Admitting: Oncology

## 2018-02-17 DIAGNOSIS — I77810 Thoracic aortic ectasia: Secondary | ICD-10-CM | POA: Insufficient documentation

## 2018-02-17 DIAGNOSIS — J439 Emphysema, unspecified: Secondary | ICD-10-CM | POA: Insufficient documentation

## 2018-02-17 DIAGNOSIS — J941 Fibrothorax: Secondary | ICD-10-CM | POA: Insufficient documentation

## 2018-02-17 DIAGNOSIS — Z87891 Personal history of nicotine dependence: Secondary | ICD-10-CM

## 2018-02-17 DIAGNOSIS — Z122 Encounter for screening for malignant neoplasm of respiratory organs: Secondary | ICD-10-CM | POA: Insufficient documentation

## 2018-02-17 DIAGNOSIS — I700 Atherosclerosis of aorta: Secondary | ICD-10-CM | POA: Insufficient documentation

## 2018-02-17 DIAGNOSIS — F17210 Nicotine dependence, cigarettes, uncomplicated: Secondary | ICD-10-CM | POA: Diagnosis not present

## 2018-02-17 NOTE — Progress Notes (Signed)
In accordance with CMS guidelines, patient has met eligibility criteria including age, absence of signs or symptoms of lung cancer.  Social History   Tobacco Use  . Smoking status: Current Every Day Smoker    Packs/day: 1.12    Years: 30.00    Pack years: 33.60    Types: Cigarettes  . Smokeless tobacco: Never Used  . Tobacco comment: 4-5 per day  Substance Use Topics  . Alcohol use: Yes    Alcohol/week: 9.0 standard drinks    Types: 9 Cans of beer per week  . Drug use: Not on file     A shared decision-making session was conducted prior to the performance of CT scan. This includes one or more decision aids, includes benefits and harms of screening, follow-up diagnostic testing, over-diagnosis, false positive rate, and total radiation exposure.  Counseling on the importance of adherence to annual lung cancer LDCT screening, impact of co-morbidities, and ability or willingness to undergo diagnosis and treatment is imperative for compliance of the program.  Counseling on the importance of continued smoking cessation for former smokers; the importance of smoking cessation for current smokers, and information about tobacco cessation interventions have been given to patient including [place] and [employer] quit [place] programs.  Written order for lung cancer screening with LDCT has been given to the patient and any and all questions have been answered to the best of my abilities.   Yearly follow up will be coordinated by Burgess Estelle, Thoracic Navigator.  Faythe Casa, NP 02/17/2018 9:47 AM

## 2018-02-20 ENCOUNTER — Encounter: Payer: Self-pay | Admitting: Family Medicine

## 2018-02-20 ENCOUNTER — Telehealth: Payer: Self-pay | Admitting: *Deleted

## 2018-02-20 ENCOUNTER — Encounter: Payer: Self-pay | Admitting: *Deleted

## 2018-02-20 DIAGNOSIS — J439 Emphysema, unspecified: Secondary | ICD-10-CM | POA: Insufficient documentation

## 2018-02-20 DIAGNOSIS — J432 Centrilobular emphysema: Secondary | ICD-10-CM | POA: Insufficient documentation

## 2018-02-20 DIAGNOSIS — I700 Atherosclerosis of aorta: Secondary | ICD-10-CM | POA: Insufficient documentation

## 2018-02-20 DIAGNOSIS — I77819 Aortic ectasia, unspecified site: Secondary | ICD-10-CM | POA: Insufficient documentation

## 2018-02-20 NOTE — Telephone Encounter (Signed)
Notified patient of LDCT lung cancer screening program results with recommendation for 12 month follow up imaging.  Also notified of incidental findings noted below and is encouraged to discuss further questions with PCP who will receive a copy of this not and/or the CT reports.  Patient verbalized understanding.     IMPRESSION: 1. Lung-RADS 1, negative. Continue annual screening with low-dose chest CT without contrast in 12 months. 2. Chronic left fibrothorax. 3. 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  Aortic Atherosclerosis (ICD10-I70.0) and Emphysema (ICD10-J43.9).

## 2018-02-24 ENCOUNTER — Ambulatory Visit: Payer: BLUE CROSS/BLUE SHIELD | Admitting: Family Medicine

## 2018-02-24 ENCOUNTER — Ambulatory Visit (INDEPENDENT_AMBULATORY_CARE_PROVIDER_SITE_OTHER): Payer: BLUE CROSS/BLUE SHIELD | Admitting: Family Medicine

## 2018-02-24 ENCOUNTER — Encounter: Payer: Self-pay | Admitting: Family Medicine

## 2018-02-24 VITALS — BP 124/76 | HR 90 | Temp 98.7°F | Resp 18 | Ht 69.0 in | Wt 123.3 lb

## 2018-02-24 DIAGNOSIS — R202 Paresthesia of skin: Secondary | ICD-10-CM

## 2018-02-24 DIAGNOSIS — I7789 Other specified disorders of arteries and arterioles: Secondary | ICD-10-CM | POA: Diagnosis not present

## 2018-02-24 DIAGNOSIS — E538 Deficiency of other specified B group vitamins: Secondary | ICD-10-CM

## 2018-02-24 DIAGNOSIS — IMO0001 Reserved for inherently not codable concepts without codable children: Secondary | ICD-10-CM | POA: Insufficient documentation

## 2018-02-24 DIAGNOSIS — Z716 Tobacco abuse counseling: Secondary | ICD-10-CM

## 2018-02-24 DIAGNOSIS — R252 Cramp and spasm: Secondary | ICD-10-CM | POA: Diagnosis not present

## 2018-02-24 DIAGNOSIS — F1020 Alcohol dependence, uncomplicated: Secondary | ICD-10-CM

## 2018-02-24 NOTE — Progress Notes (Signed)
Name: Ryan [name]   MRN: [id number]    DOB: 03-Dec-1955   Date:02/24/2018       Progress Note  Subjective  Chief Complaint  Chief Complaint  Patient presents with  . Leg Pain    right leg numbness, painful after getting up to walk    HPI  Pt presents for avute visit for RIGHT leg pain: this has been occurring intermittently for many months, cramping pain occurs after walking or being active for several minutes and goes away with rest.  Numbness occurs simultaneously.  He does note varicose veins to bilateral lower legs that do not cause pain.  - Enlarged Thoracic Aorta - found on CT scan 02/17/2018. We will refer to vascular for follow up.  - Alcoholism - Drinks about 16oz beer a day.  He does not drink liquor; we will check B-vitamin levels today.  Discussed decreasing intake.  - Tobacco Cessation Counseling: Still smoking daily; not ready to quit at this time. Spent approximately 2 minutes counseling patient. UTD on Chest CT screening.  - Recent labs performed - CBC, CMP, TSH are non-contributory to present illness.  Patient Active Problem List   Diagnosis Date Noted  . Ectatic aorta ([place]) 02/20/2018  . Emphysema lung ([place]) 02/20/2018  . Atherosclerosis of aorta ([name]) 02/20/2018  . Special screening for malignant neoplasms, colon   . Benign neoplasm of cecum   . Benign neoplasm of transverse colon   . Benign neoplasm of sigmoid colon   . Hypertension, benign 08/31/2015  . Tobacco use 08/31/2015  . Abnormal CXR 08/31/2015    Social History   Tobacco Use  . Smoking status: Current Every Day Smoker    Packs/day: 1.12    Years: 30.00    Pack years: 33.60    Types: Cigarettes  . Smokeless tobacco: Never Used  . Tobacco comment: 4-5 per day  Substance Use Topics  . Alcohol use: Yes    Alcohol/week: 9.0 standard drinks    Types: 9 Cans of beer per week    Current Outpatient Medications:  .  aspirin EC 81 MG tablet, Take 1 tablet (81 mg total) by mouth daily.,  Disp: 30 tablet, Rfl: 0 .  hydrOXYzine (ATARAX/VISTARIL) 10 MG tablet, Take 1 tablet (10 mg total) by mouth at bedtime., Disp: 20 tablet, Rfl: 0 .  lisinopril (PRINIVIL,ZESTRIL) 20 MG tablet, Take 1 tablet (20 mg total) by mouth daily. (Patient not taking: Reported on 02/11/2018), Disp: 90 tablet, Rfl: 1 .  umeclidinium-vilanterol (ANORO ELLIPTA) 62.5-25 MCG/INH AEPB, Inhale 1 puff into the lungs daily. (Patient not taking: Reported on 02/11/2018), Disp: 60 each, Rfl: 5  No Known Allergies  ROS  Ten systems reviewed and is negative except as mentioned in HPI  Objective  Vitals:   02/24/18 1335  BP: 124/76  Pulse: 90  Resp: 18  Temp: 98.7 F (37.1 C)  TempSrc: Oral  SpO2: 93%  Weight: 123 lb 4.8 oz (55.9 kg)  Height: 5\' 9"  (1.753 m)   Body mass index is 18.21 kg/m.  Nursing Note and Vital Signs reviewed.  Physical Exam  Constitutional: Patient appears well-developed and thin. No distress.  HENT: Head: Normocephalic and atraumatic. Neck: Normal range of motion. Neck supple. No JVD present. No thyromegaly present.  Cardiovascular: Normal rate, regular rhythm and normal heart sounds.  No murmur heard. No BLE edema. Pulmonary/Chest: Effort normal and breath sounds normal. No respiratory distress. Abdominal: Soft. Bowel sounds are normal, no distension. There is no tenderness. no masses  Musculoskeletal: Normal range of motion, no joint effusions. No gross deformities. Peripheral Vascular: Bilateral lower legs exhibit varicose veins - soft, non-tender, non-erythematous; bilateral calves are non-tender, non-erythematous, pain is not reproducible with palpation, negative homans', pedal pulse +2.   Neurological: he is alert and oriented to person, place, and time. No cranial nerve deficit. Coordination, balance, strength, speech and gait are normal.  Skin: Skin is warm and dry. No rash noted. No erythema.  Psychiatric: Patient has a normal mood and affect. behavior is normal. Judgment  and thought content normal.  No results found for this or any previous visit (from the past 72 hour(s)).  Assessment & Plan   1. Paresthesia - B12 and Folate Panel - Magnesium - Vitamin B1 - Strongly recommend decreasing alcohol intake, discussed potential for vitamin deficiency secondary to alcohol intake as a cause of his paresthesia  2. Calf Cramp - Ambulatory referral to Vascular Surgery - Discussed possibility of intermittent claudication - we will refer to vascular for evaluation. - B12 and Folate Panel - Magnesium - Vitamin B1  3. Enlarged thoracic aorta ([place]) - Ambulatory referral to Vascular Surgery  4. Encounter for tobacco use cessation counseling - Smoking cessation instruction/counseling given:  counseled patient on the dangers of tobacco use, advised patient to stop smoking, and reviewed strategies to maximize success  5. Alcoholism /alcohol abuse ([place]) - Discussed decreasing ETOH intake in detail; we will check labs today per orders. - B12 and Folate Panel - Magnesium - Vitamin B1

## 2018-02-27 ENCOUNTER — Ambulatory Visit (INDEPENDENT_AMBULATORY_CARE_PROVIDER_SITE_OTHER): Payer: BLUE CROSS/BLUE SHIELD

## 2018-02-27 ENCOUNTER — Emergency Department: Payer: BLUE CROSS/BLUE SHIELD

## 2018-02-27 ENCOUNTER — Encounter: Payer: Self-pay | Admitting: Emergency Medicine

## 2018-02-27 ENCOUNTER — Emergency Department
Admission: EM | Admit: 2018-02-27 | Discharge: 2018-02-27 | Disposition: A | Discharge status: Home / Self Care | Payer: BLUE CROSS/BLUE SHIELD | Attending: Emergency Medicine | Admitting: Emergency Medicine

## 2018-02-27 ENCOUNTER — Other Ambulatory Visit: Payer: Self-pay

## 2018-02-27 DIAGNOSIS — E538 Deficiency of other specified B group vitamins: Secondary | ICD-10-CM

## 2018-02-27 DIAGNOSIS — R109 Unspecified abdominal pain: Secondary | ICD-10-CM | POA: Diagnosis not present

## 2018-02-27 DIAGNOSIS — Z79899 Other long term (current) drug therapy: Secondary | ICD-10-CM | POA: Insufficient documentation

## 2018-02-27 DIAGNOSIS — I10 Essential (primary) hypertension: Secondary | ICD-10-CM | POA: Diagnosis not present

## 2018-02-27 DIAGNOSIS — D010 Carcinoma in situ of colon: Secondary | ICD-10-CM | POA: Insufficient documentation

## 2018-02-27 DIAGNOSIS — R1031 Right lower quadrant pain: Secondary | ICD-10-CM | POA: Diagnosis not present

## 2018-02-27 DIAGNOSIS — F17210 Nicotine dependence, cigarettes, uncomplicated: Secondary | ICD-10-CM | POA: Diagnosis not present

## 2018-02-27 DIAGNOSIS — Z7982 Long term (current) use of aspirin: Secondary | ICD-10-CM | POA: Diagnosis not present

## 2018-02-27 LAB — COMPREHENSIVE METABOLIC PANEL
ALK PHOS: 68 U/L (ref 38–126)
ALT: 17 U/L (ref 0–44)
ANION GAP: 6 (ref 5–15)
AST: 28 U/L (ref 15–41)
Albumin: 4.4 g/dL (ref 3.5–5.0)
BILIRUBIN TOTAL: 0.8 mg/dL (ref 0.3–1.2)
BUN: 22 mg/dL — ABNORMAL HIGH (ref 6–20)
CALCIUM: 8.5 mg/dL — AB (ref 8.9–10.3)
CO2: 25 mmol/L (ref 22–32)
CREATININE: 1.7 mg/dL — AB (ref 0.61–1.24)
Chloride: 105 mmol/L (ref 98–111)
GFR, EST AFRICAN AMERICAN: 49 mL/min — AB (ref >60)
GFR, EST NON AFRICAN AMERICAN: 42 mL/min — AB (ref >60)
Glucose, Bld: 89 mg/dL (ref 70–99)
Potassium: 4.2 mmol/L (ref 3.5–5.1)
SODIUM: 136 mmol/L (ref 135–145)
TOTAL PROTEIN: 7.6 g/dL (ref 6.5–8.1)

## 2018-02-27 LAB — CBC
HCT: 39.3 % — ABNORMAL LOW (ref 40.0–52.0)
HEMOGLOBIN: 13.7 g/dL (ref 13.0–18.0)
MCH: 33.5 pg (ref 26.0–34.0)
MCHC: 34.8 g/dL (ref 32.0–36.0)
MCV: 96.3 fL (ref 80.0–100.0)
PLATELETS: 186 10*3/uL (ref 150–440)
RBC: 4.08 MIL/uL — AB (ref 4.40–5.90)
RDW: 12.8 % (ref 11.5–14.5)
WBC: 2.7 10*3/uL — ABNORMAL LOW (ref 3.8–10.6)

## 2018-02-27 LAB — LIPASE, BLOOD: Lipase: 46 U/L (ref 11–51)

## 2018-02-27 LAB — URINALYSIS, COMPLETE (UACMP) WITH MICROSCOPIC
BACTERIA UA: NONE SEEN
BILIRUBIN URINE: NEGATIVE
GLUCOSE, UA: NEGATIVE mg/dL
KETONES UR: NEGATIVE mg/dL
Leukocytes, UA: NEGATIVE
NITRITE: NEGATIVE
PH: 5 (ref 5.0–8.0)
Protein, ur: 30 mg/dL — AB
SPECIFIC GRAVITY, URINE: 1.013 (ref 1.005–1.030)

## 2018-02-27 LAB — B12 AND FOLATE PANEL
FOLATE: 8.1 ng/mL
VITAMIN B 12: 226 pg/mL (ref 200–1100)

## 2018-02-27 LAB — VITAMIN B1: Vitamin B1 (Thiamine): <6 nmol/L — ABNORMAL LOW (ref 8–30)

## 2018-02-27 LAB — MAGNESIUM: MAGNESIUM: 1.9 mg/dL (ref 1.5–2.5)

## 2018-02-27 MED ORDER — LIDOCAINE 5 % EX PTCH
MEDICATED_PATCH | CUTANEOUS | Status: AC
  Administered 2018-02-27: 1 via TRANSDERMAL
  Filled 2018-02-27: qty 1

## 2018-02-27 MED ORDER — LIDOCAINE 5 % EX PTCH: 1.0000 | MEDICATED_PATCH | Freq: Two times a day (BID) | CUTANEOUS | 0 refills | Status: DC

## 2018-02-27 MED ORDER — IOPAMIDOL (ISOVUE-300) INJECTION 61%
80.0000 mL | Freq: Once | INTRAVENOUS | Status: AC | PRN
  Administered 2018-02-27: 80 mL via INTRAVENOUS
  Filled 2018-02-27: qty 90

## 2018-02-27 MED ORDER — LIDOCAINE 5 % EX PTCH
1.0000 | MEDICATED_PATCH | CUTANEOUS | Status: DC
  Administered 2018-02-27: 1 via TRANSDERMAL

## 2018-02-27 MED ORDER — CYANOCOBALAMIN 1000 MCG/ML IJ SOLN
1000.0000 ug | Freq: Once | INTRAMUSCULAR | Status: AC
  Administered 2018-02-27: 1000 ug via INTRAMUSCULAR

## 2018-02-27 MED ORDER — SODIUM CHLORIDE 0.9 % IV BOLUS
1000.0000 mL | Freq: Once | INTRAVENOUS | Status: AC
  Administered 2018-02-27: 1000 mL via INTRAVENOUS

## 2018-02-27 NOTE — Discharge Instructions (Addendum)
Please seek medical attention for any high fevers, chest pain, shortness of breath, change in behavior, persistent vomiting, bloody stool or any other new or concerning symptoms.  

## 2018-02-27 NOTE — ED Triage Notes (Signed)
Pt arrives via POV with complaints of right flank pain x 2 days. Denies N/V/D. Denies dysuria or hematuria. States pain is starting on his left side today.

## 2018-02-27 NOTE — ED Notes (Signed)
Patient transported to CT 

## 2018-02-27 NOTE — ED Notes (Signed)
Pt ambulatory to restroom

## 2018-02-27 NOTE — ED Notes (Signed)
Pt ambulatory to POV without difficulty. VSS. NAD. Discharge instructions, RX and follow up reviewed. All questions and concerns were addressed.

## 2018-02-27 NOTE — ED Provider Notes (Signed)
[facility] Emergency Department Provider Note   ____________________________________________   I have reviewed the triage vital signs and the nursing notes.   HISTORY  Chief Complaint Flank Pain   History limited by: Not Limited   HPI Ryan [name] is a 62 y.o. male who presents to the emergency department today because of concerns for flank pain.  He states that it started on his right side.  Started about 2 days ago.  He noticed that when he first woke up.  He denies any unusual activity the day before.  Since then he is continued to have pain on the right side but states he is now also having pain on his left side.  He denies any associated nausea or vomiting.  Denies any diarrhea.  He has not noticed any change in urination.  Denies any fever although states he has had some chills.   Per medical record review patient has a history of htn  Past Medical History:  Diagnosis Date  . Hypertension   . Neuromuscular disorder (HCC)    numbness feet  . Shortness of breath dyspnea     Patient Active Problem List   Diagnosis Date Noted  . Enlarged thoracic aorta ([place]) 02/24/2018  . Alcoholism /alcohol abuse ([place]) 02/24/2018  . Paresthesia 02/24/2018  . 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 Emphysema lung ([place]) 02/20/2018  . Atherosclerosis of aorta ([place]) 02/20/2018  . Encounter for tobacco use cessation counseling   . Benign neoplasm of cecum   . Benign neoplasm of transverse colon   . Benign neoplasm of sigmoid colon   . Hypertension, benign 08/31/2015  . Tobacco use 08/31/2015  . Abnormal CXR 08/31/2015    Past Surgical History:  Procedure Laterality Date  . COLONOSCOPY WITH PROPOFOL N/A 02/15/2016   Procedure: COLONOSCOPY WITH PROPOFOL;  Surgeon: Lucilla Lame, MD;  Location: [place];  Service: Endoscopy;  Laterality: N/A;  . HERNIA REPAIR  1999  . POLYPECTOMY N/A 02/15/2016   Procedure: POLYPECTOMY;  Surgeon: Lucilla Lame, MD;   Location: [place];  Service: Endoscopy;  Laterality: N/A;    Prior to Admission medications   Medication Sig Start Date End Date Taking? Authorizing Provider  aspirin EC 81 MG tablet Take 1 tablet (81 mg total) by mouth daily. 08/31/15   Steele Sizer, MD  hydrOXYzine (ATARAX/VISTARIL) 10 MG tablet Take 1 tablet (10 mg total) by mouth at bedtime. 02/11/18   Hubbard Hartshorn, FNP  lisinopril (PRINIVIL,ZESTRIL) 20 MG tablet Take 1 tablet (20 mg total) by mouth daily. Patient not taking: Reported on 02/11/2018 08/31/15   Steele Sizer, MD  umeclidinium-vilanterol [facility] ELLIPTA) 62.5-25 MCG/INH AEPB Inhale 1 puff into the lungs daily. Patient not taking: Reported on 02/11/2018 08/31/15   Steele Sizer, MD    Allergies Patient has no known allergies.  Family History  Problem Relation Age of Onset  . COPD Mother   . Hypertension Father   . Heart attack Brother     Social History Social History   Tobacco Use  . Smoking status: Current Every Day Smoker    Packs/day: 1.12    Years: 30.00    Pack years: 33.60    Types: Cigarettes  . Smokeless tobacco: Never Used  . Tobacco comment: 4-5 per day  Substance Use Topics  . Alcohol use: Yes    Alcohol/week: 9.0 standard drinks    Types: 9 Cans of beer per week  . Drug use: Not on file    Review  of Systems Constitutional: Positive for chills.  Eyes: No visual changes. ENT: No sore throat. Cardiovascular: Denies chest pain. Respiratory: Denies shortness of breath. Gastrointestinal: Positive for right and left flank pain. Genitourinary: Negative for dysuria. Musculoskeletal: Negative for back pain. Skin: Negative for rash. Neurological: Negative for headaches, focal weakness or numbness.  ____________________________________________   PHYSICAL EXAM:  VITAL SIGNS: ED Triage Vitals [02/27/18 1806]  Enc Vitals Group     BP (!) 141/86     Pulse Rate 82     Resp 16     Temp 99.1 F (37.3 C)     Temp Source Oral      SpO2 98 %     Weight 123 lb (55.8 kg)     Height 5\' 9"  (1.753 m)     Head Circumference      Peak Flow      Pain Score 7   Constitutional: Alert and oriented.  Eyes: Conjunctivae are normal.  ENT      Head: Normocephalic and atraumatic.      Nose: No congestion/rhinnorhea.      Mouth/Throat: Mucous membranes are moist.      Neck: No stridor. Hematological/Lymphatic/Immunilogical: No cervical lymphadenopathy. Cardiovascular: Normal rate, regular rhythm.  No murmurs, rubs, or gallops.  Respiratory: Normal respiratory effort without tachypnea nor retractions. Breath sounds are clear and equal bilaterally. No wheezes/rales/rhonchi. Gastrointestinal: Soft and non tender. No rebound. No guarding.  Genitourinary: Deferred Musculoskeletal: Normal range of motion in all extremities. No lower extremity edema. Neurologic:  Normal speech and language. No gross focal neurologic deficits are appreciated.  Skin:  Skin is warm, dry and intact. No rash noted. Psychiatric: Mood and affect are normal. Speech and behavior are normal. Patient exhibits appropriate insight and judgment.  ____________________________________________    LABS (pertinent positives/negatives)  CMP k 4.2, cr 1.70 Lipase 46 CBC wbc 2.7, hgb 13.7, plt 186 UA clear, small hgb dipstick, 0-5 rbc and wbc ____________________________________________   EKG  None  ____________________________________________    RADIOLOGY  CT abd/pel No acute findings  ____________________________________________   PROCEDURES  Procedures  ____________________________________________   INITIAL IMPRESSION / ASSESSMENT AND PLAN / ED COURSE  Pertinent labs & imaging results that were available during my care of the patient were reviewed by me and considered in my medical decision making (see chart for details).   Presented to the emergency department today because of concerns for bilateral flank pain.  Work-up including CT scan  without obvious etiology.  Mild increase of creatinine.  Discussed these findings with the patient.  At this point do wonder if arthritis is likely the cause the patient's pain.  Will trial lidocaine patch for patient.   ____________________________________________   FINAL CLINICAL IMPRESSION(S) / ED DIAGNOSES  Final diagnoses:  Flank pain     Note: This dictation was prepared with Dragon dictation. Any transcriptional errors that result from this process are unintentional     Nance Pear, MD 02/27/18 2228

## 2018-03-12 ENCOUNTER — Ambulatory Visit (INDEPENDENT_AMBULATORY_CARE_PROVIDER_SITE_OTHER): Payer: BLUE CROSS/BLUE SHIELD | Admitting: Family Medicine

## 2018-03-12 ENCOUNTER — Encounter: Payer: Self-pay | Admitting: Family Medicine

## 2018-03-12 VITALS — BP 142/90 | HR 80 | Temp 97.5°F | Resp 14 | Ht 69.0 in | Wt 123.2 lb

## 2018-03-12 DIAGNOSIS — J449 Chronic obstructive pulmonary disease, unspecified: Secondary | ICD-10-CM

## 2018-03-12 DIAGNOSIS — F1020 Alcohol dependence, uncomplicated: Secondary | ICD-10-CM

## 2018-03-12 DIAGNOSIS — E538 Deficiency of other specified B group vitamins: Secondary | ICD-10-CM | POA: Diagnosis not present

## 2018-03-12 DIAGNOSIS — I10 Essential (primary) hypertension: Secondary | ICD-10-CM

## 2018-03-12 DIAGNOSIS — IMO0001 Reserved for inherently not codable concepts without codable children: Secondary | ICD-10-CM

## 2018-03-12 DIAGNOSIS — Z23 Encounter for immunization: Secondary | ICD-10-CM | POA: Diagnosis not present

## 2018-03-12 DIAGNOSIS — I83811 Varicose veins of right lower extremities with pain: Secondary | ICD-10-CM | POA: Insufficient documentation

## 2018-03-12 DIAGNOSIS — E519 Thiamine deficiency, unspecified: Secondary | ICD-10-CM | POA: Diagnosis not present

## 2018-03-12 DIAGNOSIS — R202 Paresthesia of skin: Secondary | ICD-10-CM

## 2018-03-12 DIAGNOSIS — I700 Atherosclerosis of aorta: Secondary | ICD-10-CM

## 2018-03-12 DIAGNOSIS — E441 Mild protein-calorie malnutrition: Secondary | ICD-10-CM

## 2018-03-12 MED ORDER — VITAMIN B-12 1000 MCG SL SUBL: 1.0000 | SUBLINGUAL_TABLET | Freq: Every day | SUBLINGUAL | 5 refills | Status: DC

## 2018-03-12 MED ORDER — CYANOCOBALAMIN 1000 MCG/ML IJ SOLN
1000.0000 ug | Freq: Once | INTRAMUSCULAR | Status: AC
  Administered 2018-03-12: 1000 ug via INTRAMUSCULAR

## 2018-03-12 MED ORDER — VITAMIN B-1 50 MG PO TABS: 50.0000 mg | ORAL_TABLET | Freq: Every day | ORAL | 5 refills | Status: DC

## 2018-03-12 MED ORDER — ATORVASTATIN CALCIUM 20 MG PO TABS: 20.0000 mg | ORAL_TABLET | Freq: Every day | ORAL | 0 refills | Status: DC

## 2018-03-12 MED ORDER — UMECLIDINIUM-VILANTEROL 62.5-25 MCG/INH IN AEPB: 1.0000 | INHALATION_SPRAY | Freq: Every day | RESPIRATORY_TRACT | 2 refills | Status: DC

## 2018-03-12 MED ORDER — LISINOPRIL 10 MG PO TABS: 10.0000 mg | ORAL_TABLET | Freq: Every day | ORAL | 0 refills | Status: DC

## 2018-03-12 NOTE — Patient Instructions (Addendum)
He has contact tobacco quit line at work  Check with insurance if you can get Shingrix vaccine

## 2018-03-12 NOTE — Progress Notes (Signed)
Name: Ryan [name]   MRN: [id number]    DOB: 12/19/1955   Date:03/12/2018       Progress Note  Subjective  Chief Complaint  Chief Complaint  Patient presents with  . Follow-up    1 month F/U  . Fatigue  . Hypertension  . Nicotine Dependence  . Insomnia  . Leg Pain  . Immunizations    patient gets his flu shot at work    HPI  Paresthesia: with low B1 and B12 diagnosed within the past month and had one B12 injection, still not taking B1. States initially burning and numbness was only on calf, but now on the anterior right leg, has a follow up with Dr. Lucky Cowboy. He would like to take oral medication instead of injections monthly   Alcoholism: states used to drink 6-7 beers per day, currently around 3-4 , not ready to quit, but cutting down. Explained importance of trying to join AA group since B1 and B12 likely from alcohol abuse and malnutrition  COPD: he is on Breo, he has occasional SOB and wheezing, he states he does not cough. He is due for spirometry and we will do it on his next visit.   Malnutrition: likely multifactorial. He has lost 5 % of his weight in the past 6 months. He states baseline weight around 130 lbs. He states normal appetite, not skipping meals, he states he states has early satiety. No abdominal pain. Recently went to [facility] and CT abdomen was negative for pancreatitis. Explained he may have an ulcer and we will refer him Dr. Durwin Reges.   Atherosclerosis aorta: found on CT, he has not started aspirin yet, we will start atorvastatin , discussed possible side effects, return in 3 months for follow up.   Patient Active Problem List   Diagnosis Date Noted  . B12 deficiency 03/12/2018  . Vitamin B1 deficiency 03/12/2018  . Varicose veins of leg with pain, right 03/12/2018  . Enlarged thoracic aorta ([place]) 02/24/2018  . Alcoholism /alcohol abuse ([place]) 02/24/2018  . Paresthesia 02/24/2018  . Ectatic aorta ([place]) 02/20/2018  . Emphysema lung ([place]) 02/20/2018  .  Atherosclerosis of aorta ([place]) 02/20/2018  . Encounter for tobacco use cessation counseling   . Benign neoplasm of cecum   . Benign neoplasm of transverse colon   . Benign neoplasm of sigmoid colon   . Hypertension, benign 08/31/2015  . Tobacco use 08/31/2015  . Abnormal CXR 08/31/2015    Past Surgical History:  Procedure Laterality Date  . COLONOSCOPY WITH PROPOFOL N/A 02/15/2016   Procedure: COLONOSCOPY WITH PROPOFOL;  Surgeon: Lucilla Lame, MD;  Location: [place];  Service: Endoscopy;  Laterality: N/A;  . HERNIA REPAIR  1999  . POLYPECTOMY N/A 02/15/2016   Procedure: POLYPECTOMY;  Surgeon: Lucilla Lame, MD;  Location: [place];  Service: Endoscopy;  Laterality: N/A;    Family History  Problem Relation Age of Onset  . COPD Mother   . Hypertension Father   . Heart attack Brother     Social History   Socioeconomic History  . Marital status: Married    Spouse name: [name]  . Number of children: 5  . Years of education: Not on file  . Highest education level: High school graduate  Occupational History  . Occupation: [employer]    Comment: [name]  Social Needs  . Financial resource strain: Not hard at all  . Food insecurity:    Worry: Never true    Inability: Never true  .  Transportation needs:    Medical: No    Non-medical: No  Tobacco Use  . Smoking status: Current Every Day Smoker    Packs/day: 1.12    Years: 30.00    Pack years: 33.60    Types: Cigarettes  . Smokeless tobacco: Never Used  . Tobacco comment: trying to cut down   Substance and Sexual Activity  . Alcohol use: Yes    Alcohol/week: 9.0 standard drinks    Types: 9 Cans of beer per week  . Drug use: Never  . Sexual activity: Yes    Partners: Female    Birth control/protection: None  Lifestyle  . Physical activity:    Days per week: 0 days    Minutes per session: 0 min  . Stress: Not at all  Relationships  . Social connections:    Talks on phone: Three times a week     Gets together: Twice a week    Attends religious service: Never    Active member of club or organization: No    Attends meetings of clubs or organizations: Never    Relationship status: Married  . Intimate partner violence:    Fear of current or ex partner: No    Emotionally abused: No    Physically abused: No    Forced sexual activity: No  Other Topics Concern  . Not on file  Social History Narrative  . Not on file     Current Outpatient Medications:  .  lidocaine (LIDODERM) 5 %, Place 1 patch onto the skin every 12 (twelve) hours. Remove & Discard patch within 12 hours or as directed by MD, Disp: 10 patch, Rfl: 0 .  umeclidinium-vilanterol (ANORO ELLIPTA) 62.5-25 MCG/INH AEPB, Inhale 1 puff into the lungs daily., Disp: 60 each, Rfl: 2 .  aspirin EC 81 MG tablet, Take 1 tablet (81 mg total) by mouth daily. (Patient not taking: Reported on 03/12/2018), Disp: 30 tablet, Rfl: 0 .  atorvastatin (LIPITOR) 20 MG tablet, Take 1 tablet (20 mg total) by mouth daily., Disp: 90 tablet, Rfl: 0 .  Cyanocobalamin (VITAMIN B-12) 1000 MCG SUBL, Place 1 tablet (1,000 mcg total) under the tongue daily., Disp: 30 tablet, Rfl: 5 .  lisinopril (PRINIVIL,ZESTRIL) 10 MG tablet, Take 1 tablet (10 mg total) by mouth daily., Disp: 90 tablet, Rfl: 0 .  thiamine (VITAMIN B-1) 50 MG tablet, Take 1 tablet (50 mg total) by mouth daily., Disp: 30 tablet, Rfl: 5  No Known Allergies  I personally reviewed active problem list, medication list, allergies, family history, social history with the patient/caregiver today.   ROS  Constitutional: Negative for fever , positive for weight change.  Respiratory: Negative for cough and shortness of breath.   Cardiovascular: Negative for chest pain or palpitations.  Gastrointestinal: Negative for abdominal pain, no bowel changes.  Musculoskeletal: Negative for gait problem or joint swelling.  Skin: Negative for rash.  Neurological: Negative for dizziness or headache.  No  other specific complaints in a complete review of systems (except as listed in HPI above).  Objective  Vitals:   03/12/18 0818  BP: (!) 142/90  Pulse: 80  Resp: 14  Temp: (!) 97.5 F (36.4 C)  TempSrc: Oral  SpO2: 99%  Weight: 123 lb 3.2 oz (55.9 kg)  Height: 5' 9"  (1.753 m)    Body mass index is 18.19 kg/m.  Physical Exam  Constitutional: Patient appears well-developed and underweight, bony prominence, palpable on shoulders, scapula area, also temporal waisting .  No distress.  HEENT: head atraumatic, normocephalic, pupils equal and reactive to light,  neck supple, throat within normal limits Cardiovascular: Normal rate, regular rhythm and normal heart sounds.  No murmur heard. No BLE edema. Pulmonary/Chest: Effort normal bilateral wheezing. No respiratory distress. Abdominal: Soft.  There is no tenderness. Psychiatric: Patient has a normal mood and affect. behavior is normal. Judgment and thought content normal.   Recent Results (from the past 2160 hour(s))  CBC w/Diff/Platelet     Status: Abnormal   Collection Time: 02/11/18 10:18 AM  Result Value Ref Range   WBC 4.1 3.8 - 10.8 Thousand/uL   RBC 4.17 (L) 4.20 - 5.80 Million/uL   Hemoglobin 13.5 13.2 - 17.1 g/dL   HCT 39.7 38.5 - 50.0 %   MCV 95.2 80.0 - 100.0 fL   MCH 32.4 27.0 - 33.0 pg   MCHC 34.0 32.0 - 36.0 g/dL   RDW 12.0 11.0 - 15.0 %   Platelets 210 140 - 400 Thousand/uL   MPV 10.1 7.5 - 12.5 fL   Neutro Abs 2,345 1,500 - 7,800 cells/uL   Lymphs Abs 1,259 850 - 3,900 cells/uL   WBC mixed population 398 200 - 950 cells/uL   Eosinophils Absolute 70 15 - 500 cells/uL   Basophils Absolute 29 0 - 200 cells/uL   Neutrophils Relative % 57.2 %   Total Lymphocyte 30.7 %   Monocytes Relative 9.7 %   Eosinophils Relative 1.7 %   Basophils Relative 0.7 %  COMPLETE METABOLIC PANEL WITH GFR     Status: Abnormal   Collection Time: 02/11/18 10:18 AM  Result Value Ref Range   Glucose, Bld 80 65 - 99 mg/dL     Comment: .            Fasting reference interval .    BUN 16 7 - 25 mg/dL   Creat 1.33 (H) 0.70 - 1.25 mg/dL    Comment: For patients >102 years of age, the reference limit for Creatinine is approximately 13% higher for people identified as African-American. .    GFR, Est Non African American 58 (L) > OR = 60 mL/min/1.22m   GFR, Est African American 67 > OR = 60 mL/min/1.770m  BUN/Creatinine Ratio 12 6 - 22 (calc)   Sodium 139 135 - 146 mmol/L   Potassium 4.2 3.5 - 5.3 mmol/L   Chloride 107 98 - 110 mmol/L   CO2 25 20 - 32 mmol/L   Calcium 8.7 8.6 - 10.3 mg/dL   Total Protein 6.8 6.1 - 8.1 g/dL   Albumin 3.9 3.6 - 5.1 g/dL   Globulin 2.9 1.9 - 3.7 g/dL (calc)   AG Ratio 1.3 1.0 - 2.5 (calc)   Total Bilirubin 0.5 0.2 - 1.2 mg/dL   Alkaline phosphatase (APISO) 75 40 - 115 U/L   AST 19 10 - 35 U/L   ALT 10 9 - 46 U/L  TSH     Status: None   Collection Time: 02/11/18 10:18 AM  Result Value Ref Range   TSH 2.16 0.40 - 4.50 mIU/L  Lipid panel     Status: None   Collection Time: 02/11/18 10:18 AM  Result Value Ref Range   Cholesterol 160 <200 mg/dL   HDL 74 >40 mg/dL   Triglycerides 36 <150 mg/dL   LDL Cholesterol (Calc) 76 mg/dL (calc)    Comment: Reference range: <100 . Desirable range <100 mg/dL for primary prevention;   <70 mg/dL for patients with CHD or diabetic patients  with > or =  2 CHD risk factors. Marland Kitchen LDL-C is now calculated using the Martin-Hopkins  calculation, which is a validated novel method providing  better accuracy than the Friedewald equation in the  estimation of LDL-C.  Cresenciano Genre et al. Annamaria Helling. 8502;774(12): 2061-2068  (http://education.[facility].com/faq/FAQ164)    Total CHOL/HDL Ratio 2.2 <5.0 (calc)   Non-HDL Cholesterol (Calc) 86 <130 mg/dL (calc)    Comment: For patients with diabetes plus 1 major ASCVD risk  factor, treating to a non-HDL-C goal of <100 mg/dL  (LDL-C of <70 mg/dL) is considered a therapeutic  option.   B12 and Folate  Panel     Status: None   Collection Time: 02/24/18  2:18 PM  Result Value Ref Range   Vitamin B-12 226 200 - 1,100 pg/mL    Comment: . Please Note: Although the reference range for vitamin B12 is [phone] pg/mL, it has been reported that between 5 and 10% of patients with values between 200 and 400 pg/mL may experience neuropsychiatric and hematologic abnormalities due to occult B12 deficiency; less than 1% of patients with values above 400 pg/mL will have symptoms. .    Folate 8.1 ng/mL    Comment:                            Reference Range                            Low:           <3.4                            Borderline:    3.4-5.4                            Normal:        >5.4 .   Magnesium     Status: None   Collection Time: 02/24/18  2:18 PM  Result Value Ref Range   Magnesium 1.9 1.5 - 2.5 mg/dL  Vitamin B1     Status: Abnormal   Collection Time: 02/24/18  2:18 PM  Result Value Ref Range   Vitamin B1 (Thiamine) <6 (L) 8 - 30 nmol/L    Comment: Marland Kitchen Vitamin supplementation within 24 hours prior to blood draw may affect the accuracy of the results. . This test was developed and its analytical performance characteristics have been determined by [place], [place]. It has not been cleared or approved by the U.S. Food and Drug Administration. This assay has been validated pursuant to the CLIA regulations and is used for clinical purposes. .   Urinalysis, Complete w Microscopic     Status: Abnormal   Collection Time: 02/27/18  6:09 PM  Result Value Ref Range   Color, Urine YELLOW (A) YELLOW   APPearance CLEAR (A) CLEAR   Specific Gravity, Urine 1.013 1.005 - 1.030   pH 5.0 5.0 - 8.0   Glucose, UA NEGATIVE NEGATIVE mg/dL   Hgb urine dipstick SMALL (A) NEGATIVE   Bilirubin Urine NEGATIVE NEGATIVE   Ketones, ur NEGATIVE NEGATIVE mg/dL   Protein, ur 30 (A) NEGATIVE mg/dL   Nitrite NEGATIVE NEGATIVE   Leukocytes, UA NEGATIVE NEGATIVE    RBC / HPF 0-5 0 - 5 RBC/hpf   WBC, UA 0-5 0 - 5 WBC/hpf   Bacteria, UA NONE SEEN  NONE SEEN   Squamous Epithelial / LPF 0-5 0 - 5   Mucus PRESENT    Hyaline Casts, UA PRESENT     Comment: Performed at [facility], [address] [place], [place] [address]  Lipase, blood     Status: None   Collection Time: 02/27/18  6:09 PM  Result Value Ref Range   Lipase 46 11 - 51 U/L    Comment: Performed at [facility], [address] [place], [place] [address]  Comprehensive metabolic panel     Status: Abnormal   Collection Time: 02/27/18  6:09 PM  Result Value Ref Range   Sodium 136 135 - 145 mmol/L   Potassium 4.2 3.5 - 5.1 mmol/L   Chloride 105 98 - 111 mmol/L   CO2 25 22 - 32 mmol/L   Glucose, Bld 89 70 - 99 mg/dL   BUN 22 (H) 6 - 20 mg/dL   Creatinine, Ser 1.70 (H) 0.61 - 1.24 mg/dL   Calcium 8.5 (L) 8.9 - 10.3 mg/dL   Total Protein 7.6 6.5 - 8.1 g/dL   Albumin 4.4 3.5 - 5.0 g/dL   AST 28 15 - 41 U/L   ALT 17 0 - 44 U/L   Alkaline Phosphatase 68 38 - 126 U/L   Total Bilirubin 0.8 0.3 - 1.2 mg/dL   GFR calc non Af Amer 42 (L) >60 mL/min   GFR calc Af Amer 49 (L) >60 mL/min    Comment: (NOTE) The eGFR has been calculated using the CKD EPI equation. This calculation has not been validated in all clinical situations. eGFR's persistently <60 mL/min signify possible Chronic Kidney Disease.    Anion gap 6 5 - 15    Comment: Performed at [facility], [address] [place], [place] [address]  CBC     Status: Abnormal   Collection Time: 02/27/18  6:09 PM  Result Value Ref Range   WBC 2.7 (L) 3.8 - 10.6 K/uL   RBC 4.08 (L) 4.40 - 5.90 MIL/uL   Hemoglobin 13.7 13.0 - 18.0 g/dL   HCT 39.3 (L) 40.0 - 52.0 %   MCV 96.3 80.0 - 100.0 fL   MCH 33.5 26.0 - 34.0 pg   MCHC 34.8 32.0 - 36.0 g/dL   RDW 12.8 11.5 - 14.5 %   Platelets 186 150 - 440 K/uL    Comment: Performed at [facility], [address] [place],  [address]      PHQ2/9: Depression screen [facility] 2/9 03/12/2018 02/24/2018 02/11/2018 08/31/2015  Decreased Interest 0 0 0 0  Down, Depressed, Hopeless 0 0 0 0  PHQ - 2 Score 0 0 0 0  Altered sleeping 3 1 1  -  Tired, decreased energy 3 1 1  -  Change in appetite 0 0 0 -  Feeling bad or failure about yourself  0 0 0 -  Trouble concentrating 0 0 0 -  Moving slowly or fidgety/restless 0 0 0 -  Suicidal thoughts 0 0 0 -  PHQ-9 Score 6 2 2  -  Difficult doing work/chores - Not difficult at all Not difficult at all -     Fall Risk: Fall Risk  03/12/2018 02/11/2018 08/31/2015  Falls in the past year? No No No    Assessment & Plan  1. Atherosclerosis of aorta (HCC)  - atorvastatin (LIPITOR) 20 MG tablet; Take 1 tablet (20 mg total) by mouth daily.  Dispense: 90 tablet; Refill: 0  2. B12 deficiency  - cyanocobalamin ((  VITAMIN B-12)) injection 1,000 mcg - Cyanocobalamin (VITAMIN B-12) 1000 MCG SUBL; Place 1 tablet (1,000 mcg total) under the tongue daily.  Dispense: 30 tablet; Refill: 5  3. Vitamin B1 deficiency  - thiamine (VITAMIN B-1) 50 MG tablet; Take 1 tablet (50 mg total) by mouth daily.  Dispense: 30 tablet; Refill: 5  4. Need for Tdap vaccination  - Tdap vaccine greater than or equal to 7yo IM  5. Need for vaccination for pneumococcus  - Pneumococcal polysaccharide vaccine 23-valent greater than or equal to 2yo subcutaneous/IM  6. Needs flu shot  - Flu Vaccine QUAD 6+ mos PF IM (Fluarix Quad PF)  7. Alcoholism /alcohol abuse ([place])  Discussed AA   8. Hypertension, benign  - lisinopril (PRINIVIL,ZESTRIL) 10 MG tablet; Take 1 tablet (10 mg total) by mouth daily.  Dispense: 90 tablet; Refill: 0  9. Chronic obstructive pulmonary disease, unspecified COPD type ([place])  Continue Breo   10. Paresthesia  Hopefully B1 and B12 supplementation will help with symptoms   11. Varicose veins of leg with pain, right  Seeing Dr Lucky Cowboy soon    12. Mild protein-calorie malnutrition  ([place])  Discussed adding ensure, he states he can eat, but eats small portions, discussed referral to Dr. Durwin Reges but he said he has always been a slow eater, he does not want to see anyone else at this time

## 2018-03-13 ENCOUNTER — Ambulatory Visit: Payer: BLUE CROSS/BLUE SHIELD

## 2018-03-17 ENCOUNTER — Encounter (INDEPENDENT_AMBULATORY_CARE_PROVIDER_SITE_OTHER): Payer: Self-pay | Admitting: Vascular Surgery

## 2018-03-17 ENCOUNTER — Ambulatory Visit (INDEPENDENT_AMBULATORY_CARE_PROVIDER_SITE_OTHER): Payer: BLUE CROSS/BLUE SHIELD | Admitting: Vascular Surgery

## 2018-03-17 VITALS — BP 177/106 | HR 83 | Resp 16 | Ht 69.0 in | Wt 123.8 lb

## 2018-03-17 DIAGNOSIS — I10 Essential (primary) hypertension: Secondary | ICD-10-CM | POA: Diagnosis not present

## 2018-03-17 DIAGNOSIS — I712 Thoracic aortic aneurysm, without rupture: Secondary | ICD-10-CM | POA: Diagnosis not present

## 2018-03-17 DIAGNOSIS — F17210 Nicotine dependence, cigarettes, uncomplicated: Secondary | ICD-10-CM

## 2018-03-17 DIAGNOSIS — I739 Peripheral vascular disease, unspecified: Secondary | ICD-10-CM | POA: Diagnosis not present

## 2018-03-17 DIAGNOSIS — I7789 Other specified disorders of arteries and arterioles: Secondary | ICD-10-CM

## 2018-03-17 DIAGNOSIS — Z720 Tobacco use: Secondary | ICD-10-CM

## 2018-03-17 DIAGNOSIS — I70213 Atherosclerosis of native arteries of extremities with intermittent claudication, bilateral legs: Secondary | ICD-10-CM

## 2018-03-17 NOTE — Progress Notes (Signed)
Patient ID: Ryan FITZPATRICK, male   DOB: 01-29-1956, 62 y.o.   MRN: [id number]  Chief Complaint  Patient presents with  . New Patient (Initial Visit)    ref rle intermittent claudication and thoracic aorta enlargement     HPI Ryan [name] is a 62 y.o. male.  I am asked to see the patient by Dr. Uvaldo [name] for evaluation of claudication and a small thoracic aortic aneurysm.  The patient reports several months of pain in the right leg with activity.  There is no clear inciting event or causative factor that started the symptoms.  No left leg symptoms.  He says he can walk up to about 100 yards at most before having to stop and rest.  He has a long history of tobacco use and hypertension.  No history of ulceration or infection.  No symptoms concerning for ischemic rest pain.  He has undergone a CT scan of the chest abdomen and pelvis which I have reviewed.  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  His abdominal aorta and iliac arteries has mild atherosclerotic changes as well as some changes in the right common femoral artery that appear to be a little bit more moderate.  The proximal portions of the SFA and profunda femoris arteries have some disease as well but are not occluded.  This is far down as the scans ago.   Past Medical History:  Diagnosis Date  . Hypertension   . Neuromuscular disorder (HCC)    numbness feet  . Shortness of breath dyspnea     Past Surgical History:  Procedure Laterality Date  . COLONOSCOPY WITH PROPOFOL N/A 02/15/2016   Procedure: COLONOSCOPY WITH PROPOFOL;  Surgeon: Lucilla Lame, MD;  Location: [facility];  Service: Endoscopy;  Laterality: N/A;  . HERNIA REPAIR  1999  . POLYPECTOMY N/A 02/15/2016   Procedure: POLYPECTOMY;  Surgeon: Lucilla Lame, MD;  Location: [facility];  Service: Endoscopy;  Laterality: N/A;    Family History  Problem Relation Age of Onset  . COPD Mother   . Hypertension Father   . Heart attack Brother   no  bleeding or clotting disorders  Social History Social History   Tobacco Use  . Smoking status: Current Every Day Smoker    Packs/day: 1.12    Years: 30.00    Pack years: 33.60    Types: Cigarettes  . Smokeless tobacco: Never Used  . Tobacco comment: trying to cut down   Substance Use Topics  . Alcohol use: Yes    Alcohol/week: 9.0 standard drinks    Types: 9 Cans of beer per week  . Drug use: Never     No Known Allergies  Current Outpatient Medications  Medication Sig Dispense Refill  . atorvastatin (LIPITOR) 20 MG tablet Take 1 tablet (20 mg total) by mouth daily. 90 tablet 0  . Cyanocobalamin (VITAMIN B-12) 1000 MCG SUBL Place 1 tablet (1,000 mcg total) under the tongue daily. 30 tablet 5  . lidocaine (LIDODERM) 5 % Place 1 patch onto the skin every 12 (twelve) hours. Remove & Discard patch within 12 hours or as directed by MD 10 patch 0  . lisinopril (PRINIVIL,ZESTRIL) 10 MG tablet Take 1 tablet (10 mg total) by mouth daily. 90 tablet 0  . thiamine (VITAMIN B-1) 50 MG tablet Take 1 tablet (50 mg total) by mouth daily. 30 tablet 5  . umeclidinium-vilanterol (ANORO ELLIPTA) 62.5-25 MCG/INH AEPB Inhale 1 puff into the lungs daily.  60 each 2  . aspirin EC 81 MG tablet Take 1 tablet (81 mg total) by mouth daily. (Patient not taking: Reported on 03/12/2018) 30 tablet 0   No current facility-administered medications for this visit.       REVIEW OF SYSTEMS (Negative unless checked)  Constitutional: _0 Weight loss  _1 Fever  _2 Chills Cardiac: _3 Chest pain   _4 Chest pressure   _5 Palpitations   _6 Shortness of breath when laying flat   _7 Shortness of breath at rest   _8 Shortness of breath with exertion. Vascular:  _9 Pain in legs with walking   _10 Pain in legs at rest   _11 Pain in legs when laying flat   _12 Claudication   _13 Pain in feet when walking  _14 Pain in feet at rest  _15 Pain in feet when laying flat   _16 History of DVT   _17 Phlebitis   _18 Swelling in legs   _19 Varicose veins    _20 Non-healing ulcers Pulmonary:   _21 Uses home oxygen   _22 Productive cough   _23 Hemoptysis   _24 Wheeze  _25 COPD   _26 Asthma Neurologic:  _27 Dizziness  _28 Blackouts   _29 Seizures   _30 History of stroke   _31 History of TIA  _32 Aphasia   _33 Temporary blindness   _34 Dysphagia   _35 Weakness or numbness in arms   _36 Weakness or numbness in legs Musculoskeletal:  _37 Arthritis   _38 Joint swelling   _39 Joint pain   _40 Low back pain Hematologic:  _41 Easy bruising  _42 Easy bleeding   _43 Hypercoagulable state   _44 Anemic  _45 Hepatitis Gastrointestinal:  _46 Blood in stool   _47 Vomiting blood  _48 Gastroesophageal reflux/heartburn   _49 Abdominal pain Genitourinary:  _50 Chronic kidney disease   _51 Difficult urination  _52 Frequent urination  _53 Burning with urination   _54 Hematuria Skin:  _55 Rashes   _56 Ulcers   _57 Wounds Psychological:  _58 History of anxiety   _59  History of major depression.    Physical Exam BP (!) 177/106 (BP Location: Right Arm)   Pulse 83   Resp 16   Ht _60  (1.753 m)   Wt 123 lb 12.8 oz (56.2 kg)   BMI 18.28 kg/m  Gen:  WD/WN, NAD Head: [place]/AT, No temporalis wasting. Ear/Nose/Throat: Hearing grossly intact, nares w/o erythema or drainage, oropharynx w/o Erythema/Exudate Eyes: Conjunctiva clear, sclera non-icteric  Neck: trachea midline.  No bruit or JVD.  Pulmonary:  Good air movement, clear to auscultation bilaterally.  Cardiac: RRR, normal S1, S2 Vascular:  Vessel Right Left  Radial Palpable Palpable                          PT  1+ palpable  1+ palpable  DP  1+ palpable Palpable   Gastrointestinal: soft, non-tender/non-distended.  Musculoskeletal: M/S 5/5 throughout.  Extremities without ischemic changes.  No deformity or atrophy.  No edema. Neurologic: Sensation grossly intact in extremities.  Symmetrical.  Speech is fluent. Motor exam as listed above. Psychiatric: Judgment intact, Mood & affect appropriate for pt's clinical situation. Dermatologic: No rashes or ulcers noted.  No cellulitis or open  wounds.    Radiology Ct Abdomen Pelvis W Contrast  Result Date: 02/27/2018 CLINICAL DATA:  Right flank pain and bilateral lower abdominal pain. EXAM: CT ABDOMEN AND PELVIS WITH CONTRAST TECHNIQUE: Multidetector CT imaging of the abdomen and pelvis was performed using the standard protocol following bolus administration of intravenous contrast. CONTRAST:  26m ISOVUE-300 IOPAMIDOL (ISOVUE-300) INJECTION 61% COMPARISON:  None. FINDINGS: Lower chest: Pleural thickening and plaque-like calcifications along the inferior and anterior left pleural surface. Severe bullous emphysematous changes in the left lung base. Hepatobiliary: No  focal liver abnormality is seen. No gallstones, gallbladder wall thickening, or biliary dilatation. Pancreas: Unremarkable. No pancreatic ductal dilatation or surrounding inflammatory changes. Spleen: Normal in size without focal abnormality. Adrenals/Urinary Tract: Adrenal glands are unremarkable. Kidneys are normal, without renal calculi, focal lesion, or hydronephrosis. Bladder is unremarkable. Stomach/Bowel: Nonspecific gas is distension of small and large bowel loops. No evidence of inflammatory changes or obstruction. Vascular/Lymphatic: Aortic atherosclerosis. No enlarged abdominal or pelvic lymph nodes. Reproductive: Prostate is unremarkable. Other: No abdominal wall hernia or abnormality. No abdominopelvic ascites. Musculoskeletal: Remote posttraumatic versus osteoarthritic changes of the inferior endplate of O13 vertebral body. Posterior facet arthropathy of the lower lumbosacral spine. IMPRESSION: No evidence of nephrolithiasis or obstructive uropathy. No acute abnormalities within the solid abdominal organs. Marked emphysematous bullous changes and pleural/subpleural thickening and plaque-like calcifications of the left lower lobe of the lung. Electronically Signed   By: Fidela Salisbury M.D.   On: 02/27/2018 20:21   Ct Chest Lung Ca Screen Low Dose W/o Cm  Result Date:  02/17/2018 CLINICAL DATA:  61 year old asymptomatic male current smoker with 33.75 pack-year smoking history. EXAM: CT CHEST WITHOUT CONTRAST LOW-DOSE FOR LUNG CANCER SCREENING TECHNIQUE: Multidetector CT imaging of the chest was performed following the standard protocol without IV contrast. COMPARISON:  08/31/2015 chest radiograph.  01/01/2006 chest CT. FINDINGS: Cardiovascular: Normal heart size. No significant pericardial effusion/thickening. Atherosclerotic thoracic aorta with ectatic 4.2 cm ascending thoracic aorta. Normal caliber pulmonary arteries. Mediastinum/Nodes: No discrete thyroid nodules. Unremarkable esophagus. No pathologically enlarged axillary, mediastinal or hilar lymph nodes, noting limited sensitivity for the detection of hilar adenopathy on this noncontrast study. Lungs/Pleura: No pneumothorax. Pleural thickening and plaque-like calcifications throughout left pleural space is unchanged. No right-sided calcified pleural plaques. No significant pleural effusions. Severe centrilobular and paraseptal emphysema with bullous emphysema at the left lung apex. Stable pleural-parenchymal scarring at the left lung base. No acute consolidative airspace disease or lung masses. No significant pulmonary nodules. Upper abdomen: No acute abnormality. Musculoskeletal: No aggressive appearing focal osseous lesions. Mild thoracic spondylosis. IMPRESSION: 1. Lung-RADS 1, negative. Continue annual screening with low-dose chest CT without contrast in 12 months. 2. Chronic left fibrothorax. 3. 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 Aortic Atherosclerosis (ICD10-I70.0) and Emphysema (ICD10-J43.9). Electronically Signed   By: Ilona Sorrel M.D.   On: 02/17/2018 16:26    Labs Recent Results (from the past 2160 hour(s))  CBC w/Diff/Platelet     Status: Abnormal   Collection Time: 02/11/18 10:18 AM  Result Value Ref Range   WBC 4.1 3.8 - 10.8 Thousand/uL   RBC 4.17 (L) 4.20 - 5.80 Million/uL   Hemoglobin 13.5  13.2 - 17.1 g/dL   HCT 39.7 38.5 - 50.0 %   MCV 95.2 80.0 - 100.0 fL   MCH 32.4 27.0 - 33.0 pg   MCHC 34.0 32.0 - 36.0 g/dL   RDW 12.0 11.0 - 15.0 %   Platelets 210 140 - 400 Thousand/uL   MPV 10.1 7.5 - 12.5 fL   Neutro Abs 2,345 1,500 - 7,800 cells/uL   Lymphs Abs 1,259 850 - 3,900 cells/uL   WBC mixed population 398 200 - 950 cells/uL   Eosinophils Absolute 70 15 - 500 cells/uL   Basophils Absolute 29 0 - 200 cells/uL   Neutrophils Relative % 57.2 %   Total Lymphocyte 30.7 %   Monocytes Relative 9.7 %   Eosinophils Relative 1.7 %   Basophils Relative 0.7 %  COMPLETE METABOLIC PANEL WITH GFR     Status: Abnormal  Collection Time: 02/11/18 10:18 AM  Result Value Ref Range   Glucose, Bld 80 65 - 99 mg/dL    Comment: .            Fasting reference interval .    BUN 16 7 - 25 mg/dL   Creat 1.33 (H) 0.70 - 1.25 mg/dL    Comment: For patients >51 years of age, the reference limit for Creatinine is approximately 13% higher for people identified as African-American. .    GFR, Est Non African American 58 (L) > OR = 60 mL/min/1.5m   GFR, Est African American 67 > OR = 60 mL/min/1.781m  BUN/Creatinine Ratio 12 6 - 22 (calc)   Sodium 139 135 - 146 mmol/L   Potassium 4.2 3.5 - 5.3 mmol/L   Chloride 107 98 - 110 mmol/L   CO2 25 20 - 32 mmol/L   Calcium 8.7 8.6 - 10.3 mg/dL   Total Protein 6.8 6.1 - 8.1 g/dL   Albumin 3.9 3.6 - 5.1 g/dL   Globulin 2.9 1.9 - 3.7 g/dL (calc)   AG Ratio 1.3 1.0 - 2.5 (calc)   Total Bilirubin 0.5 0.2 - 1.2 mg/dL   Alkaline phosphatase (APISO) 75 40 - 115 U/L   AST 19 10 - 35 U/L   ALT 10 9 - 46 U/L  TSH     Status: None   Collection Time: 02/11/18 10:18 AM  Result Value Ref Range   TSH 2.16 0.40 - 4.50 mIU/L  Lipid panel     Status: None   Collection Time: 02/11/18 10:18 AM  Result Value Ref Range   Cholesterol 160 <200 mg/dL   HDL 74 >40 mg/dL   Triglycerides 36 <150 mg/dL   LDL Cholesterol (Calc) 76 mg/dL (calc)    Comment: Reference  range: <100 . Desirable range <100 mg/dL for primary prevention;   <70 mg/dL for patients with CHD or diabetic patients  with > or = 2 CHD risk factors. . Marland KitchenDL-C is now calculated using the Martin-Hopkins  calculation, which is a validated novel method providing  better accuracy than the Friedewald equation in the  estimation of LDL-C.  MaCresenciano Genret al. JAAnnamaria [id number];[id number]  (http://education.[facility].com/faq/FAQ164)    Total CHOL/HDL Ratio 2.2 <5.0 (calc)   Non-HDL Cholesterol (Calc) 86 <130 mg/dL (calc)    Comment: For patients with diabetes plus 1 major ASCVD risk  factor, treating to a non-HDL-C goal of <100 mg/dL  (LDL-C of <70 mg/dL) is considered a therapeutic  option.   B12 and Folate Panel     Status: None   Collection Time: 02/24/18  2:18 PM  Result Value Ref Range   Vitamin B-12 226 200 - 1,100 pg/mL    Comment: . Please Note: Although the reference range for vitamin B12 is [phone] pg/mL, it has been reported that between 5 and 10% of patients with values between 200 and 400 pg/mL may experience neuropsychiatric and hematologic abnormalities due to occult B12 deficiency; less than 1% of patients with values above 400 pg/mL will have symptoms. .    Folate 8.1 ng/mL    Comment:                            Reference Range                            Low:           <  3.4                            Borderline:    3.4-5.4                            Normal:        >5.4 .   Magnesium     Status: None   Collection Time: 02/24/18  2:18 PM  Result Value Ref Range   Magnesium 1.9 1.5 - 2.5 mg/dL  Vitamin B1     Status: Abnormal   Collection Time: 02/24/18  2:18 PM  Result Value Ref Range   Vitamin B1 (Thiamine) <6 (L) 8 - 30 nmol/L    Comment: Marland Kitchen Vitamin supplementation within 24 hours prior to blood draw may affect the accuracy of the results. . This test was developed and its analytical performance characteristics have been determined by  [place], [place]. It has not been cleared or approved by the U.S. Food and Drug Administration. This assay has been validated pursuant to the CLIA regulations and is used for clinical purposes. .   Urinalysis, Complete w Microscopic     Status: Abnormal   Collection Time: 02/27/18  6:09 PM  Result Value Ref Range   Color, Urine YELLOW (A) YELLOW   APPearance CLEAR (A) CLEAR   Specific Gravity, Urine 1.013 1.005 - 1.030   pH 5.0 5.0 - 8.0   Glucose, UA NEGATIVE NEGATIVE mg/dL   Hgb urine dipstick SMALL (A) NEGATIVE   Bilirubin Urine NEGATIVE NEGATIVE   Ketones, ur NEGATIVE NEGATIVE mg/dL   Protein, ur 30 (A) NEGATIVE mg/dL   Nitrite NEGATIVE NEGATIVE   Leukocytes, UA NEGATIVE NEGATIVE   RBC / HPF 0-5 0 - 5 RBC/hpf   WBC, UA 0-5 0 - 5 WBC/hpf   Bacteria, UA NONE SEEN NONE SEEN   Squamous Epithelial / LPF 0-5 0 - 5   Mucus PRESENT    Hyaline Casts, UA PRESENT     Comment: Performed at [facility], [address] [place], [place] [address]  Lipase, blood     Status: None   Collection Time: 02/27/18  6:09 PM  Result Value Ref Range   Lipase 46 11 - 51 U/L    Comment: Performed at [facility], [address] [place], [place] [address]  Comprehensive metabolic panel     Status: Abnormal   Collection Time: 02/27/18  6:09 PM  Result Value Ref Range   Sodium 136 135 - 145 mmol/L   Potassium 4.2 3.5 - 5.1 mmol/L   Chloride 105 98 - 111 mmol/L   CO2 25 22 - 32 mmol/L   Glucose, Bld 89 70 - 99 mg/dL   BUN 22 (H) 6 - 20 mg/dL   Creatinine, Ser 1.70 (H) 0.61 - 1.24 mg/dL   Calcium 8.5 (L) 8.9 - 10.3 mg/dL   Total Protein 7.6 6.5 - 8.1 g/dL   Albumin 4.4 3.5 - 5.0 g/dL   AST 28 15 - 41 U/L   ALT 17 0 - 44 U/L   Alkaline Phosphatase 68 38 - 126 U/L   Total Bilirubin 0.8 0.3 - 1.2 mg/dL   GFR calc non Af Amer 42 (L) >60 mL/min   GFR calc Af Amer 49 (L) >60 mL/min    Comment: (NOTE) The eGFR has been calculated using the CKD  EPI equation. This calculation  has not been validated in all clinical situations. eGFR's persistently <60 mL/min signify possible Chronic Kidney Disease.    Anion gap 6 5 - 15    Comment: Performed at [facility], [address] [place], [place] [address]  CBC     Status: Abnormal   Collection Time: 02/27/18  6:09 PM  Result Value Ref Range   WBC 2.7 (L) 3.8 - 10.6 K/uL   RBC 4.08 (L) 4.40 - 5.90 MIL/uL   Hemoglobin 13.7 13.0 - 18.0 g/dL   HCT 39.3 (L) 40.0 - 52.0 %   MCV 96.3 80.0 - 100.0 fL   MCH 33.5 26.0 - 34.0 pg   MCHC 34.8 32.0 - 36.0 g/dL   RDW 12.8 11.5 - 14.5 %   Platelets 186 150 - 440 K/uL    Comment: Performed at [facility], [address] [place], [place] [address]    Assessment/Plan:  Hypertension, benign blood pressure control important in reducing the progression of atherosclerotic disease and aneurysmal growth. On appropriate oral medications.   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  This is likely largely due to tobacco use and hypertension which is not optimally controlled.  We discussed the pathophysiology and natural history of this.  This can be checked every year or 2 with a CT scan.  Tobacco use We had a discussion for approximately 3 minutes regarding the absolute need for smoking cessation due to the deleterious nature of tobacco on the vascular system. We discussed the tobacco use would diminish patency of any intervention, and likely significantly worsen progressio of disease. We discussed multiple agents for quitting including replacement therapy or medications to reduce cravings such as Chantix. The patient voices their understanding of the importance of smoking cessation.   Claudication [facility]) The patient describes symptoms concerning for claudication of the right lower extremity.  He has multiple atherosclerotic risk factors.  He does have a history of neurogenic issues,  and neurogenic claudication is a possibility as well.  He was recently started on aspirin and a statin agent by his primary care physician and I would recommend he continue those.  We will obtain noninvasive studies of his lower extremities in the near future at his convenience and I will see him back following the studies to discuss the results and determine further treatment options.  We discussed the natural history and pathophysiology of peripheral arterial disease.  He does not have any immediate limb threatening symptoms.      Leotis Pain 03/17/2018, 9:29 AM   This note was created with Dragon medical transcription system.  Any errors from dictation are unintentional.

## 2018-03-17 NOTE — Assessment & Plan Note (Signed)
The patient describes symptoms concerning for claudication of the right lower extremity.  He has multiple atherosclerotic risk factors.  He does have a history of neurogenic issues, and neurogenic claudication is a possibility as well.  He was recently started on aspirin and a statin agent by his primary care physician and I would recommend he continue those.  We will obtain noninvasive studies of his lower extremities in the near future at his convenience and I will see him back following the studies to discuss the results and determine further treatment options.  We discussed the natural history and pathophysiology of peripheral arterial disease.  He does not have any immediate limb threatening symptoms.

## 2018-03-17 NOTE — Assessment & Plan Note (Signed)
blood pressure control important in reducing the progression of atherosclerotic disease and aneurysmal growth. On appropriate oral medications.  

## 2018-03-17 NOTE — Patient Instructions (Signed)

## 2018-03-17 NOTE — Assessment & Plan Note (Signed)
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  This is likely largely due to tobacco use and hypertension which is not optimally controlled.  We discussed the pathophysiology and natural history of this.  This can be checked every year or 2 with a CT scan.

## 2018-03-17 NOTE — Assessment & Plan Note (Signed)

## 2018-03-25 ENCOUNTER — Ambulatory Visit (INDEPENDENT_AMBULATORY_CARE_PROVIDER_SITE_OTHER): Payer: BLUE CROSS/BLUE SHIELD | Admitting: Nurse Practitioner

## 2018-03-25 ENCOUNTER — Encounter (INDEPENDENT_AMBULATORY_CARE_PROVIDER_SITE_OTHER): Payer: BLUE CROSS/BLUE SHIELD

## 2018-04-22 ENCOUNTER — Encounter (INDEPENDENT_AMBULATORY_CARE_PROVIDER_SITE_OTHER): Payer: Self-pay

## 2018-04-22 ENCOUNTER — Ambulatory Visit (INDEPENDENT_AMBULATORY_CARE_PROVIDER_SITE_OTHER): Payer: BLUE CROSS/BLUE SHIELD

## 2018-04-22 ENCOUNTER — Encounter (INDEPENDENT_AMBULATORY_CARE_PROVIDER_SITE_OTHER): Payer: Self-pay | Admitting: Nurse Practitioner

## 2018-04-22 ENCOUNTER — Ambulatory Visit (INDEPENDENT_AMBULATORY_CARE_PROVIDER_SITE_OTHER): Payer: BLUE CROSS/BLUE SHIELD | Admitting: Nurse Practitioner

## 2018-04-22 VITALS — BP 172/110 | HR 63 | Resp 16 | Ht 68.5 in | Wt 128.0 lb

## 2018-04-22 DIAGNOSIS — I739 Peripheral vascular disease, unspecified: Secondary | ICD-10-CM | POA: Diagnosis not present

## 2018-04-22 DIAGNOSIS — I83811 Varicose veins of right lower extremities with pain: Secondary | ICD-10-CM

## 2018-04-22 DIAGNOSIS — I10 Essential (primary) hypertension: Secondary | ICD-10-CM | POA: Diagnosis not present

## 2018-04-22 DIAGNOSIS — F17210 Nicotine dependence, cigarettes, uncomplicated: Secondary | ICD-10-CM | POA: Diagnosis not present

## 2018-04-22 NOTE — Progress Notes (Signed)
Subjective:    Patient ID: Ryan [name], male    DOB: 03/10/1956, 62 y.o.   MRN: [id number] Chief Complaint  Patient presents with  . Follow-up    ABI follow up    HPI  Ryan [name] is a 62 y.o. male that returns to the office with concerns for claudication.  He describes several months of claudication-like pain in his right calf.  He states that the pain would have been after walking, which was followed by intense cramping activity which caused him to stop what he was doing.  He states that most recently the pain has started to cause a burning sensation radiating down to his toes.  He denies any active ulceration or infection.  He denies any rest pain like symptoms.  He currently utilizes tobacco as well as has a history of a neuromuscular disorder which causes numbness of the feet.    He denies any chest pain or shortness of breath.  He denies any fever, chills, nausea, vomiting diarrhea.  He denies any TIA-like symptoms or amaurosis fugax.  Today Ryan [name] underwent bilateral ABIs.  The right ABI was 0.66 and the left 1.10.  The left lower extremity had triphasic waveforms with normal toe waveforms.  The right lower extremity had monophasic anterior tibial waveforms with biphasic posterior tibial waveforms.  The right first digit had abnormal dampened toe waveforms.  There is no previous test for comparison.  Past Medical History:  Diagnosis Date  . Hypertension   . Neuromuscular disorder (HCC)    numbness feet  . Shortness of breath dyspnea     Past Surgical History:  Procedure Laterality Date  . COLONOSCOPY WITH PROPOFOL N/A 02/15/2016   Procedure: COLONOSCOPY WITH PROPOFOL;  Surgeon: Lucilla Lame, MD;  Location: [facility];  Service: Endoscopy;  Laterality: N/A;  . HERNIA REPAIR  1999  . POLYPECTOMY N/A 02/15/2016   Procedure: POLYPECTOMY;  Surgeon: Lucilla Lame, MD;  Location: [facility];  Service: Endoscopy;  Laterality: N/A;    Social History    Socioeconomic History  . Marital status: Married    Spouse name: [name]  . Number of children: 5  . Years of education: Not on file  . Highest education level: High school graduate  Occupational History  . Occupation: [employer]    Comment: [name]  Social Needs  . Financial resource strain: Not hard at all  . Food insecurity:    Worry: Never true    Inability: Never true  . Transportation needs:    Medical: No    Non-medical: No  Tobacco Use  . Smoking status: Current Every Day Smoker    Packs/day: 1.12    Years: 30.00    Pack years: 33.60    Types: Cigarettes  . Smokeless tobacco: Never Used  . Tobacco comment: trying to cut down   Substance and Sexual Activity  . Alcohol use: Yes    Alcohol/week: 9.0 standard drinks    Types: 9 Cans of beer per week  . Drug use: Never  . Sexual activity: Yes    Partners: Female    Birth control/protection: None  Lifestyle  . Physical activity:    Days per week: 0 days    Minutes per session: 0 min  . Stress: Not at all  Relationships  . Social connections:    Talks on phone: Three times a week    Gets together: Twice a week    Attends religious service: Never  Active member of club or organization: No    Attends meetings of clubs or organizations: Never    Relationship status: Married  . Intimate partner violence:    Fear of current or ex partner: No    Emotionally abused: No    Physically abused: No    Forced sexual activity: No  Other Topics Concern  . Not on file  Social History Narrative  . Not on file    Family History  Problem Relation Age of Onset  . COPD Mother   . Hypertension Father   . Heart attack Brother     No Known Allergies   Review of Systems   Review of Systems: Negative Unless Checked Constitutional: [] Weight loss  [] Fever  [] Chills Cardiac: [] Chest pain   []  Atrial Fibrillation  [] Palpitations   [] Shortness of breath when laying flat   [] Shortness of breath with exertion. Vascular:   [x] Pain in legs with walking   [] Pain in legs with standing  [] History of DVT   [] Phlebitis   [] Swelling in legs   [] Varicose veins   [] Non-healing ulcers Pulmonary:   [] Uses home oxygen   [] Productive cough   [] Hemoptysis   [] Wheeze  [] COPD   [] Asthma Neurologic:  [] Dizziness   [] Seizures   [] History of stroke   [] History of TIA  [] Aphasia   [] Vissual changes   [] Weakness or numbness in arm   [x] Weakness or numbness in leg Musculoskeletal:   [] Joint swelling   [x] Joint pain   [] Low back pain  []  History of Knee Replacement Hematologic:  [] Easy bruising  [] Easy bleeding   [] Hypercoagulable state   [] Anemic Gastrointestinal:  [] Diarrhea   [] Vomiting  [] Gastroesophageal reflux/heartburn   [] Difficulty swallowing. Genitourinary:  [] Chronic kidney disease   [] Difficult urination  [] Anuric   [] Blood in urine Skin:  [] Rashes   [] Ulcers  Psychological:  [] History of anxiety   []  History of major depression  []  Memory Difficulties     Objective:   Physical Exam  BP (!) 172/110 (BP Location: Right Arm, Patient Position: Sitting)   Pulse 63   Resp 16   Ht 5' 8.5" (1.74 m)   Wt 128 lb (58.1 kg)   BMI 19.18 kg/m   Gen: WD/WN, NAD Head: [place]/AT, No temporalis wasting.  Ear/Nose/Throat: Hearing grossly intact, nares w/o erythema or drainage Eyes: PER, EOMI, sclera nonicteric.  Neck: Supple, no masses.  No JVD.  Pulmonary:  Good air movement, no use of accessory muscles.  Cardiac: RRR Vascular: scattered prominent varicose veins bilaterally, 1-2 mm in size Vessel Right Left  Radial Palpable Palpable  Dorsalis Pedis  trace palpable Palpable  Posterior Tibial  trace palpable Palpable   Gastrointestinal: soft, non-distended. No guarding/no peritoneal signs.  Musculoskeletal: M/S 5/5 throughout.  No deformity or atrophy.  Neurologic: Pain and light touch intact in extremities.  Symmetrical.  Speech is fluent. Motor exam as listed above. Psychiatric: Judgment intact, Mood & affect appropriate for  pt's clinical situation. Dermatologic: No Venous rashes. No Ulcers Noted.  No changes consistent with cellulitis. Lymph : No Cervical lymphadenopathy, no lichenification or skin changes of chronic lymphedema.      Assessment & Plan:   1. Claudication [facility]) Today Ryan [name] underwent bilateral ABIs.  The right ABI was 0.66 and the left 1.10.  The left lower extremity had triphasic waveforms with normal toe waveforms.  The right lower extremity had monophasic anterior tibial waveforms with biphasic posterior tibial waveforms.  The right first digit had abnormal dampened toe waveforms.  There is no previous test for comparison.  Recommend:  The patient has experienced increased symptoms and is now describing lifestyle limiting claudication.  Given the severity of the patient's lower extremity symptoms the patient should undergo angiography and intervention.  Risk and benefits were reviewed the patient.  Indications for the procedure were reviewed.  All questions were answered, the patient agrees to proceed.   Also discussed with the patient what to expect following the procedure  The patient should continue walking and begin a more formal exercise program.  The patient should continue antiplatelet therapy and aggressive treatment of the lipid abnormalities  The patient will follow up with me after the angiogram.   2. Varicose veins of leg with pain, right Recommend:  The patient is complaining of varicose veins.    I have had a long discussion with the patient regarding  varicose veins and why they cause symptoms.  Patient will begin wearing graduated compression stockings on a daily basis, beginning first thing in the morning and removing them in the evening. The patient is instructed specifically not to sleep in the stockings.    The patient  will also begin using over-the-counter analgesics such as Motrin 600 mg po TID to help control the symptoms as needed.    In addition,  behavioral modification including elevation during the day will be initiated, utilizing a recliner was recommended.  The patient is also instructed to continue exercising such as walking 4-5 times per week.  At this time the patient wishes to continue conservative therapy and is not interested in more invasive treatments such as laser ablation and sclerotherapy.  The Patient will follow up PRN if the symptoms worsen.  3. Hypertension, benign Continue antihypertensive medications as already ordered, these medications have been reviewed and there are no changes at this time. Bilateral varicose veins   Current Outpatient Medications on File Prior to Visit  Medication Sig Dispense Refill  . aspirin EC 81 MG tablet Take 1 tablet (81 mg total) by mouth daily. (Patient not taking: Reported on 03/12/2018) 30 tablet 0  . atorvastatin (LIPITOR) 20 MG tablet Take 1 tablet (20 mg total) by mouth daily. 90 tablet 0  . Cyanocobalamin (VITAMIN B-12) 1000 MCG SUBL Place 1 tablet (1,000 mcg total) under the tongue daily. 30 tablet 5  . lidocaine (LIDODERM) 5 % Place 1 patch onto the skin every 12 (twelve) hours. Remove & Discard patch within 12 hours or as directed by MD 10 patch 0  . lisinopril (PRINIVIL,ZESTRIL) 10 MG tablet Take 1 tablet (10 mg total) by mouth daily. 90 tablet 0  . thiamine (VITAMIN B-1) 50 MG tablet Take 1 tablet (50 mg total) by mouth daily. 30 tablet 5  . umeclidinium-vilanterol (ANORO ELLIPTA) 62.5-25 MCG/INH AEPB Inhale 1 puff into the lungs daily. 60 each 2   No current facility-administered medications on file prior to visit.     There are no Patient Instructions on file for this visit. No follow-ups on file.   Kris Hartmann, NP  This note was completed with Sales executive.  Any errors are purely unintentional.

## 2018-04-30 ENCOUNTER — Other Ambulatory Visit: Payer: Self-pay | Admitting: Family Medicine

## 2018-04-30 DIAGNOSIS — E519 Thiamine deficiency, unspecified: Secondary | ICD-10-CM

## 2018-04-30 DIAGNOSIS — I700 Atherosclerosis of aorta: Secondary | ICD-10-CM

## 2018-04-30 DIAGNOSIS — I10 Essential (primary) hypertension: Secondary | ICD-10-CM

## 2018-04-30 DIAGNOSIS — E538 Deficiency of other specified B group vitamins: Secondary | ICD-10-CM

## 2018-04-30 DIAGNOSIS — J449 Chronic obstructive pulmonary disease, unspecified: Secondary | ICD-10-CM

## 2018-04-30 MED ORDER — ATORVASTATIN CALCIUM 20 MG PO TABS: 20.0000 mg | ORAL_TABLET | Freq: Every day | ORAL | 0 refills | Status: DC

## 2018-04-30 MED ORDER — UMECLIDINIUM-VILANTEROL 62.5-25 MCG/INH IN AEPB: 1.0000 | INHALATION_SPRAY | Freq: Every day | RESPIRATORY_TRACT | 0 refills | Status: DC

## 2018-04-30 MED ORDER — VITAMIN B-1 50 MG PO TABS: 50.0000 mg | ORAL_TABLET | Freq: Every day | ORAL | 5 refills | Status: DC

## 2018-04-30 MED ORDER — LISINOPRIL 10 MG PO TABS: 10.0000 mg | ORAL_TABLET | Freq: Every day | ORAL | 0 refills | Status: DC

## 2018-04-30 MED ORDER — VITAMIN B-12 1000 MCG SL SUBL: 1.0000 | SUBLINGUAL_TABLET | Freq: Every day | SUBLINGUAL | 0 refills | Status: DC

## 2018-04-30 MED ORDER — LIDOCAINE 5 % EX PTCH: 1.0000 | MEDICATED_PATCH | Freq: Two times a day (BID) | CUTANEOUS | 0 refills | Status: DC

## 2018-04-30 NOTE — Telephone Encounter (Signed)
Copied from [place] [id number]. Topic: Quick Communication - Rx Refill/Question >> Apr 30, 2018 11:01 AM Percell Belt A wrote: Medication: Pt was not able to pick up these meds in sept/19.  He could not afford them back in sept.  He would like to have them all call back in  umeclidinium-vilanterol [facility] ELLIPTA) 62.5-25 MCG/INH AEPB [[id number]] thiamine (VITAMIN B-1) 50 MG tablet [[id number]]  lisinopril (PRINIVIL,ZESTRIL) 10 MG tablet [[id number] Cyanocobalamin (VITAMIN B-12) 1000 MCG SUBL [[id number] atorvastatin (LIPITOR) 20 MG tablet [[id number] Has the patient contacted their pharmacy? No. (Agent: If no, request that the patient contact the pharmacy for the refill.) (Agent: If yes, when and what did the pharmacy advise?)  Preferred Pharmacy (with phone number or street name):   [facility] (N), [place] [phone] (Phone)    Agent: Please be advised that RX refills may take up to 3 business days. We ask that you follow-up with your pharmacy.

## 2018-05-01 ENCOUNTER — Other Ambulatory Visit (INDEPENDENT_AMBULATORY_CARE_PROVIDER_SITE_OTHER): Payer: Self-pay | Admitting: Nurse Practitioner

## 2018-05-05 ENCOUNTER — Encounter
Admission: RE | Admit: 2018-05-05 | Discharge: 2018-05-05 | Disposition: A | Discharge status: Home / Self Care | Payer: BLUE CROSS/BLUE SHIELD | Source: Ambulatory Visit | Attending: Vascular Surgery | Admitting: Vascular Surgery

## 2018-05-05 DIAGNOSIS — I7090 Unspecified atherosclerosis: Secondary | ICD-10-CM | POA: Insufficient documentation

## 2018-05-05 DIAGNOSIS — Z01812 Encounter for preprocedural laboratory examination: Secondary | ICD-10-CM | POA: Insufficient documentation

## 2018-05-05 HISTORY — DX: Peripheral vascular disease, unspecified: I73.9

## 2018-05-05 LAB — CREATININE, SERUM
Creatinine, Ser: 1.31 mg/dL — ABNORMAL HIGH (ref 0.61–1.24)
GFR calc Af Amer: >60 mL/min (ref >60)
GFR calc non Af Amer: 57 mL/min — ABNORMAL LOW (ref >60)

## 2018-05-05 LAB — BUN: BUN: 16 mg/dL (ref 8–23)

## 2018-05-06 MED ORDER — DEXTROSE 5 % IV SOLN
2.0000 g | Freq: Once | INTRAVENOUS | Status: DC
  Filled 2018-05-06: qty 20

## 2018-05-07 ENCOUNTER — Encounter
Admission: RE | Disposition: A | Discharge status: Home / Self Care | Payer: Self-pay | Source: Ambulatory Visit | Attending: Vascular Surgery

## 2018-05-07 ENCOUNTER — Other Ambulatory Visit: Payer: Self-pay

## 2018-05-07 ENCOUNTER — Ambulatory Visit
Admission: RE | Admit: 2018-05-07 | Discharge: 2018-05-07 | Disposition: A | Discharge status: Home / Self Care | Payer: BLUE CROSS/BLUE SHIELD | Source: Ambulatory Visit | Attending: Vascular Surgery | Admitting: Vascular Surgery

## 2018-05-07 DIAGNOSIS — I70211 Atherosclerosis of native arteries of extremities with intermittent claudication, right leg: Secondary | ICD-10-CM | POA: Insufficient documentation

## 2018-05-07 DIAGNOSIS — G709 Myoneural disorder, unspecified: Secondary | ICD-10-CM | POA: Insufficient documentation

## 2018-05-07 DIAGNOSIS — I83811 Varicose veins of right lower extremities with pain: Secondary | ICD-10-CM | POA: Insufficient documentation

## 2018-05-07 DIAGNOSIS — Z7982 Long term (current) use of aspirin: Secondary | ICD-10-CM | POA: Diagnosis not present

## 2018-05-07 DIAGNOSIS — I10 Essential (primary) hypertension: Secondary | ICD-10-CM | POA: Insufficient documentation

## 2018-05-07 DIAGNOSIS — F17210 Nicotine dependence, cigarettes, uncomplicated: Secondary | ICD-10-CM | POA: Diagnosis not present

## 2018-05-07 DIAGNOSIS — Z79899 Other long term (current) drug therapy: Secondary | ICD-10-CM | POA: Diagnosis not present

## 2018-05-07 DIAGNOSIS — Z8249 Family history of ischemic heart disease and other diseases of the circulatory system: Secondary | ICD-10-CM | POA: Insufficient documentation

## 2018-05-07 DIAGNOSIS — I70219 Atherosclerosis of native arteries of extremities with intermittent claudication, unspecified extremity: Secondary | ICD-10-CM

## 2018-05-07 DIAGNOSIS — I70221 Atherosclerosis of native arteries of extremities with rest pain, right leg: Secondary | ICD-10-CM | POA: Diagnosis not present

## 2018-05-07 HISTORY — PX: LOWER EXTREMITY ANGIOGRAPHY: CATH118251

## 2018-05-07 SURGERY — LOWER EXTREMITY ANGIOGRAPHY
Anesthesia: Moderate Sedation | Laterality: Right

## 2018-05-07 MED ORDER — HEPARIN SODIUM (PORCINE) 1000 UNIT/ML IJ SOLN
INTRAMUSCULAR | Status: AC
  Filled 2018-05-07: qty 1

## 2018-05-07 MED ORDER — SODIUM CHLORIDE 0.9 % IV SOLN: 250.0000 mL | INTRAVENOUS | Status: DC | PRN

## 2018-05-07 MED ORDER — IOPAMIDOL (ISOVUE-300) INJECTION 61%
INTRAVENOUS | Status: DC | PRN
  Administered 2018-05-07: 50 mL via INTRA_ARTERIAL

## 2018-05-07 MED ORDER — MIDAZOLAM HCL 5 MG/5ML IJ SOLN
INTRAMUSCULAR | Status: AC
  Filled 2018-05-07: qty 5

## 2018-05-07 MED ORDER — SODIUM CHLORIDE 0.9% FLUSH: 3.0000 mL | Freq: Two times a day (BID) | INTRAVENOUS | Status: DC

## 2018-05-07 MED ORDER — LIDOCAINE-EPINEPHRINE (PF) 1 %-1:200000 IJ SOLN
INTRAMUSCULAR | Status: AC
  Filled 2018-05-07: qty 30

## 2018-05-07 MED ORDER — MIDAZOLAM HCL 2 MG/2ML IJ SOLN
INTRAMUSCULAR | Status: DC | PRN
  Administered 2018-05-07: 1 mg via INTRAVENOUS
  Administered 2018-05-07: 2 mg via INTRAVENOUS
  Administered 2018-05-07: 1 mg via INTRAVENOUS

## 2018-05-07 MED ORDER — CLOPIDOGREL BISULFATE 75 MG PO TABS: 75.0000 mg | ORAL_TABLET | Freq: Every day | ORAL | 11 refills | Status: DC

## 2018-05-07 MED ORDER — HYDRALAZINE HCL 20 MG/ML IJ SOLN: 5.0000 mg | INTRAMUSCULAR | Status: DC | PRN

## 2018-05-07 MED ORDER — SODIUM CHLORIDE 0.9 % IV SOLN
INTRAVENOUS | Status: DC
  Administered 2018-05-07: 09:00:00 via INTRAVENOUS

## 2018-05-07 MED ORDER — SODIUM CHLORIDE 0.9% FLUSH: 3.0000 mL | INTRAVENOUS | Status: DC | PRN

## 2018-05-07 MED ORDER — FENTANYL CITRATE (PF) 100 MCG/2ML IJ SOLN
INTRAMUSCULAR | Status: AC
  Filled 2018-05-07: qty 2

## 2018-05-07 MED ORDER — ONDANSETRON HCL 4 MG/2ML IJ SOLN: 4.0000 mg | Freq: Four times a day (QID) | INTRAMUSCULAR | Status: DC | PRN

## 2018-05-07 MED ORDER — FENTANYL CITRATE (PF) 100 MCG/2ML IJ SOLN
INTRAMUSCULAR | Status: DC | PRN
  Administered 2018-05-07 (×2): 25 ug via INTRAVENOUS
  Administered 2018-05-07: 50 ug via INTRAVENOUS

## 2018-05-07 MED ORDER — CLOPIDOGREL BISULFATE 75 MG PO TABS: 75.0000 mg | ORAL_TABLET | Freq: Every day | ORAL | Status: DC

## 2018-05-07 MED ORDER — LABETALOL HCL 5 MG/ML IV SOLN: 10.0000 mg | INTRAVENOUS | Status: DC | PRN

## 2018-05-07 MED ORDER — HEPARIN SODIUM (PORCINE) 1000 UNIT/ML IJ SOLN
INTRAMUSCULAR | Status: DC | PRN
  Administered 2018-05-07: 5000 [IU] via INTRAVENOUS

## 2018-05-07 MED ORDER — HEPARIN (PORCINE) IN NACL 1000-0.9 UT/500ML-% IV SOLN
INTRAVENOUS | Status: AC
  Filled 2018-05-07: qty 1000

## 2018-05-07 MED ORDER — ACETAMINOPHEN 325 MG PO TABS: 650.0000 mg | ORAL_TABLET | ORAL | Status: DC | PRN

## 2018-05-07 MED ORDER — SODIUM CHLORIDE 0.9 % IV SOLN: INTRAVENOUS | Status: DC

## 2018-05-07 MED ORDER — HYDROMORPHONE HCL 1 MG/ML IJ SOLN: 1.0000 mg | Freq: Once | INTRAMUSCULAR | Status: DC | PRN

## 2018-05-07 SURGICAL SUPPLY — 16 items
BALLN LUTONIX 5X220X130 (BALLOONS) ×2
BALLOON LUTONIX 5X220X130 (BALLOONS) ×1 IMPLANT
CANNULA 5F STIFF (CANNULA) ×2 IMPLANT
CATH BEACON 5 .038 100 VERT TP (CATHETERS) ×2 IMPLANT
CATH PIG 70CM (CATHETERS) ×2 IMPLANT
DEVICE PRESTO INFLATION (MISCELLANEOUS) ×2 IMPLANT
DEVICE STARCLOSE SE CLOSURE (Vascular Products) ×2 IMPLANT
GLIDEWIRE ADV .035X260CM (WIRE) ×2 IMPLANT
PACK ANGIOGRAPHY (CUSTOM PROCEDURE TRAY) ×2 IMPLANT
SHEATH ANL2 6FRX45 HC (SHEATH) ×2 IMPLANT
SHEATH BRITE TIP 5FRX11 (SHEATH) ×2 IMPLANT
SHEATH BRITE TIP 6FRX5.5 (SHEATH) ×2 IMPLANT
STENT VIABAHN 6X250X120 (Permanent Stent) ×2 IMPLANT
TUBING CONTRAST HIGH PRESS 72 (TUBING) ×2 IMPLANT
WIRE G V18X300CM (WIRE) ×2 IMPLANT
WIRE J 3MM .035X145CM (WIRE) ×2 IMPLANT

## 2018-05-07 NOTE — Op Note (Signed)
[facility]  Percutaneous Study/Intervention Procedural Note   Date of Surgery: 05/07/2018  Surgeon(s):Nene Aranas    Assistants:none  Pre-operative Diagnosis: PAD with claudication RLE  Post-operative diagnosis:  Same  Procedure(s) Performed:             1.  Ultrasound guidance for vascular access left femoral artery             2.  Catheter placement into right SFA from left femoral approach             3.  Aortogram and selective right lower extremity angiogram             4.  Percutaneous transluminal angioplasty of right SFA and popliteal artery with 5 mm diameter by 22 cm length Lutonix drug-coated angioplasty balloon             5.   Viabahn stent placement to the right SFA and popliteal arteries for residual stenosis/occlusion after angioplasty using a 6 mm diameter by 25 cm length stent  6.  StarClose closure device left femoral artery  EBL: 5 cc  Contrast: 50 cc  Fluoro Time: 4.7 minutes  Moderate Conscious Sedation Time: approximately 30 minutes using 4 mg of Versed and 100 mcg of Fentanyl              Indications:  Patient is a 62 y.o.male with short distance lifestyle limiting claudication. The patient has noninvasive study showing markedly reduced right ABI at 0.6 range. The patient is brought in for angiography for further evaluation and potential treatment. Risks and benefits are discussed and informed consent is obtained.   Procedure:  The patient was identified and appropriate procedural time out was performed.  The patient was then placed supine on the table and prepped and draped in the usual sterile fashion. Moderate conscious sedation was administered during a face to face encounter with the patient throughout the procedure with my supervision of the RN administering medicines and monitoring the patient's vital signs, pulse oximetry, telemetry and mental status throughout from the start of the procedure until the patient was taken to the  recovery room. Ultrasound was used to evaluate the left common femoral artery.  It was patent .  A digital ultrasound image was acquired.  A Seldinger needle was used to access the left common femoral artery under direct ultrasound guidance and a permanent image was performed.  A 0.035 J wire was advanced without resistance and a 5Fr sheath was placed.  Pigtail catheter was placed into the aorta and an AP aortogram was performed. This demonstrated normal renal arteries and normal aorta and iliac segments without significant stenosis. I then crossed the aortic bifurcation and advanced to the right femoral head and then into the proximal right superficial femoral artery with the pigtail catheter. Selective right lower extremity angiogram was then performed. This demonstrated a normal common femoral artery, profunda femoris artery, and proximal superficial femoral artery.  The mid superficial femoral artery was occluded with reconstitution of the popliteal artery around the level of the knee.  There was then two-vessel runoff distally through both the posterior tibial and anterior tibial arteries. It was felt that it was in the patient's best interest to proceed with intervention after these images to avoid a second procedure and a larger amount of contrast and fluoroscopy based off of the findings from the initial angiogram. The patient was systemically heparinized and a 6 [place] Ansell sheath was then placed over the [employer] wire.  I then used a Kumpe catheter and the advantage wire to easily navigate through the SFA and popliteal occlusion and confirm intraluminal flow in the below-knee popliteal artery.  I then replaced the wire and performed angioplasty with a 5 mm diameter by 22 cm length Lutonix drug-coated angioplasty balloon inflated to 8 atm for 1 minute in the mid to distal SFA and above-knee popliteal artery.  Completion imaging still showed minimal flow with residual high-grade stenosis or  occlusion in the distal SFA and above-knee popliteal artery.  I exchanged for a 0.018 wire and placed a 6 mm diameter by 25 cm length Viabahn covered stent from the mid SFA down to the mid popliteal artery to encompass the lesion.  This was postdilated with a 5 mm balloon with excellent angiographic completion result and less than 10% residual stenosis. I elected to terminate the procedure. The sheath was removed and StarClose closure device was deployed in the left femoral artery with excellent hemostatic result. The patient was taken to the recovery room in stable condition having tolerated the procedure well.  Findings:               Aortogram:  normal renal arteries, normal aorta and iliac arteries without significant stenosis             Right Lower Extremity:  Normal common femoral artery, profunda femoris artery, and proximal superficial femoral artery.  The mid superficial femoral artery was occluded with reconstitution of the popliteal artery around the level of the knee.  There was then two-vessel runoff distally through both the posterior tibial and anterior tibial arteries.   Disposition: Patient was taken to the recovery room in stable condition having tolerated the procedure well.  Complications: None  Jason Dew 05/07/2018 10:01 AM   This note was created with Dragon Medical transcription system. Any errors in dictation are purely unintentional. 

## 2018-05-07 NOTE — H&P (Signed)
[facility] History & Physical Update  The patient was interviewed and re-examined.  The patient's previous History and Physical has been reviewed and is unchanged.  There is no change in the plan of care. We plan to proceed with the scheduled procedure.  Leotis Pain, MD  05/07/2018, 8:46 AM

## 2018-05-07 NOTE — Progress Notes (Signed)
Dr. Lucky Cowboy at bedside, speaking with pt. And his daughter re: results. Both verbalize understanding. Stable for DC home.

## 2018-05-08 ENCOUNTER — Telehealth (INDEPENDENT_AMBULATORY_CARE_PROVIDER_SITE_OTHER): Payer: Self-pay

## 2018-05-08 NOTE — Telephone Encounter (Signed)
Patient's daughter called with questions regarding the patient's care after his angio procedure. She stated the patient was not given discharge papers regarding his care. I gave her the discharge instructions over the phone regarding her father.

## 2018-05-28 ENCOUNTER — Other Ambulatory Visit (INDEPENDENT_AMBULATORY_CARE_PROVIDER_SITE_OTHER): Payer: Self-pay | Admitting: Vascular Surgery

## 2018-05-28 DIAGNOSIS — I739 Peripheral vascular disease, unspecified: Secondary | ICD-10-CM

## 2018-06-01 ENCOUNTER — Ambulatory Visit (INDEPENDENT_AMBULATORY_CARE_PROVIDER_SITE_OTHER): Payer: BLUE CROSS/BLUE SHIELD | Admitting: Nurse Practitioner

## 2018-06-01 ENCOUNTER — Ambulatory Visit (INDEPENDENT_AMBULATORY_CARE_PROVIDER_SITE_OTHER): Payer: BLUE CROSS/BLUE SHIELD

## 2018-06-01 ENCOUNTER — Encounter (INDEPENDENT_AMBULATORY_CARE_PROVIDER_SITE_OTHER): Payer: Self-pay | Admitting: Nurse Practitioner

## 2018-06-01 VITALS — BP 186/105 | HR 84 | Resp 16 | Ht 68.5 in | Wt 124.0 lb

## 2018-06-01 DIAGNOSIS — F17210 Nicotine dependence, cigarettes, uncomplicated: Secondary | ICD-10-CM | POA: Diagnosis not present

## 2018-06-01 DIAGNOSIS — I739 Peripheral vascular disease, unspecified: Secondary | ICD-10-CM

## 2018-06-01 DIAGNOSIS — I10 Essential (primary) hypertension: Secondary | ICD-10-CM | POA: Diagnosis not present

## 2018-06-01 DIAGNOSIS — I83811 Varicose veins of right lower extremities with pain: Secondary | ICD-10-CM

## 2018-06-01 NOTE — Progress Notes (Signed)
Subjective:    Patient ID: Ryan [name], male    DOB: 1956-03-25, 62 y.o.   MRN: [id number] Chief Complaint  Patient presents with  . Follow-up    3 week ABI    HPI  Ryan [name] is a 62 y.o. male The patient returns to the office for followup and review status post angiogram with intervention. The patient notes improvement in the lower extremity symptoms. No interval shortening of the patient's claudication distance or rest pain symptoms.   No new ulcers or wounds have occurred since the last visit.  There have been no significant changes to the patient's overall health care.  The patient denies amaurosis fugax or recent TIA symptoms. There are no recent neurological changes noted. The patient denies history of DVT, PE or superficial thrombophlebitis. The patient denies recent episodes of angina or shortness of breath.   ABI's Rt=1.14 and Lt=>1.0  (previous ABI's Rt=0.66 and Lt=1.10) Tibial waveforms bilaterally are triphasic.  Strong toe waveforms bilaterally.  Past Medical History:  Diagnosis Date  . Hypertension   . Neuromuscular disorder (HCC)    numbness feet  . Peripheral vascular disease ([place])   . Shortness of breath dyspnea     Past Surgical History:  Procedure Laterality Date  . COLONOSCOPY WITH PROPOFOL N/A 02/15/2016   Procedure: COLONOSCOPY WITH PROPOFOL;  Surgeon: Lucilla Lame, MD;  Location: [facility];  Service: Endoscopy;  Laterality: N/A;  . HERNIA REPAIR  1999   left inguinal  . LOWER EXTREMITY ANGIOGRAPHY Right 05/07/2018   Procedure: LOWER EXTREMITY ANGIOGRAPHY;  Surgeon: Algernon Huxley, MD;  Location: [facility];  Service: Cardiovascular;  Laterality: Right;  . POLYPECTOMY N/A 02/15/2016   Procedure: POLYPECTOMY;  Surgeon: Lucilla Lame, MD;  Location: [facility];  Service: Endoscopy;  Laterality: N/A;    Social History   Socioeconomic History  . Marital status: Legally Separated    Spouse name: [name]  . Number of  children: 5  . Years of education: Not on file  . Highest education level: High school graduate  Occupational History  . Occupation: Fork[employer]    Comment: [name]  Social Needs  . Financial resource strain: Not hard at all  . Food insecurity:    Worry: Never true    Inability: Never true  . Transportation needs:    Medical: No    Non-medical: No  Tobacco Use  . Smoking status: Current Every Day Smoker    Packs/day: 1.12    Years: 50.00    Pack years: 56.00    Types: Cigarettes  . Smokeless tobacco: Never Used  . Tobacco comment: trying to cut down   Substance and Sexual Activity  . Alcohol use: Yes    Alcohol/week: 9.0 standard drinks    Types: 9 Cans of beer per week  . Drug use: Never  . Sexual activity: Yes    Partners: Female    Birth control/protection: None  Lifestyle  . Physical activity:    Days per week: 0 days    Minutes per session: 0 min  . Stress: Not at all  Relationships  . Social connections:    Talks on phone: Three times a week    Gets together: Twice a week    Attends religious service: Never    Active member of club or organization: No    Attends meetings of clubs or organizations: Never    Relationship status: Married  . Intimate partner violence:    Fear  of current or ex partner: No    Emotionally abused: No    Physically abused: No    Forced sexual activity: No  Other Topics Concern  . Not on file  Social History Narrative  . Not on file    Family History  Problem Relation Age of Onset  . COPD Mother   . Hypertension Father   . Heart attack Brother     No Known Allergies   Review of Systems   Review of Systems: Negative Unless Checked Constitutional: [] Weight loss  [] Fever  [] Chills Cardiac: [] Chest pain   []  Atrial Fibrillation  [] Palpitations   [] Shortness of breath when laying flat   [] Shortness of breath with exertion. Vascular:  [] Pain in legs with walking   [] Pain in legs with standing  [] History of DVT   [] Phlebitis    [] Swelling in legs   [x] Varicose veins   [] Non-healing ulcers Pulmonary:   [] Uses home oxygen   [] Productive cough   [] Hemoptysis   [] Wheeze  [] COPD   [] Asthma Neurologic:  [] Dizziness   [] Seizures   [] History of stroke   [] History of TIA  [] Aphasia   [] Vissual changes   [] Weakness or numbness in arm   [] Weakness or numbness in leg Musculoskeletal:   [] Joint swelling   [x] Joint pain   [] Low back pain  []  History of Knee Replacement Hematologic:  [] Easy bruising  [] Easy bleeding   [] Hypercoagulable state   [] Anemic Gastrointestinal:  [] Diarrhea   [] Vomiting  [] Gastroesophageal reflux/heartburn   [] Difficulty swallowing. Genitourinary:  [] Chronic kidney disease   [] Difficult urination  [] Anuric   [] Blood in urine Skin:  [] Rashes   [] Ulcers  Psychological:  [] History of anxiety   []  History of major depression  []  Memory Difficulties     Objective:   Physical Exam  BP (!) 186/105 (BP Location: Right Arm, Patient Position: Sitting)   Pulse 84   Resp 16   Ht 5' 8.5" (1.74 m)   Wt 124 lb (56.2 kg)   BMI 18.58 kg/m   Gen: WD/WN, NAD, poor dentition Head: [place]/AT, No temporalis wasting.  Ear/Nose/Throat: Hearing grossly intact, nares w/o erythema or drainage Eyes: PER, EOMI, sclera nonicteric.  Neck: Supple, no masses.  No JVD.  Pulmonary:  Good air movement, no use of accessory muscles.  Cardiac: RRR Vascular:  Gatter prominent varicose veins bilaterally, 1 to 2 mm in size. Vessel Right Left  Radial Palpable Palpable  Dorsalis Pedis Palpable Palpable  Posterior Tibial Palpable Palpable   Gastrointestinal: soft, non-distended. No guarding/no peritoneal signs.  Musculoskeletal: M/S 5/5 throughout.  No deformity or atrophy.  Neurologic: Pain and light touch intact in extremities.  Symmetrical.  Speech is fluent. Motor exam as listed above. Psychiatric: Judgment intact, Mood & affect appropriate for pt's clinical situation. Dermatologic: No Venous rashes. No Ulcers Noted.  No changes  consistent with cellulitis. Lymph : No Cervical lymphadenopathy, no lichenification or skin changes of chronic lymphedema.      Assessment & Plan:   1. Hypertension, benign Continue antihypertensive medications as already ordered, these medications have been reviewed and there are no changes at this time.   2. Claudication ([place]) ABI's Rt=1.14 and Lt=>1.0  (previous ABI's Rt=0.66 and Lt=1.10) Tibial waveforms bilaterally are triphasic.  Strong toe waveforms bilaterally.  Recommend:  The patient is status post successful angiogram with intervention.  The patient reports that the claudication symptoms and leg pain is essentially gone.   The patient denies lifestyle limiting changes at this point in time.  No  further invasive studies, angiography or surgery at this time The patient should continue walking and begin a more formal exercise program.  The patient should continue antiplatelet therapy and aggressive treatment of the lipid abnormalities  Smoking cessation was again discussed  The patient should continue wearing graduated compression socks 10-15 mmHg strength to control the mild edema.  Patient should undergo noninvasive studies as ordered. The patient will follow up with me after the studies.   - VAS [place] ABI WITH/WO TBI; Future - VAS [place] LOWER EXTREMITY ARTERIAL DUPLEX; Future  3. Varicose veins of leg with pain, right Patient states that following his angiogram pain and burning that he felt in his bilateral lower extremities has essentially went away.  He states that he still has varicose veins but they are no longer causing any issues at this time.  Advised the patient to maintain utilizing conservative therapy such as medical grade 1 compression stockings, elevation, exercise, and NSAIDs for pain control.   Current Outpatient Medications on File Prior to Visit  Medication Sig Dispense Refill  . aspirin EC 81 MG tablet Take 1 tablet (81 mg total) by mouth daily. 30 tablet  0  . atorvastatin (LIPITOR) 20 MG tablet Take 1 tablet (20 mg total) by mouth daily. 90 tablet 0  . clopidogrel (PLAVIX) 75 MG tablet Take 1 tablet (75 mg total) by mouth daily. 30 tablet 11  . Cyanocobalamin (VITAMIN B-12) 1000 MCG SUBL Place 1 tablet (1,000 mcg total) under the tongue daily. 90 tablet 0  . lisinopril (PRINIVIL,ZESTRIL) 10 MG tablet Take 1 tablet (10 mg total) by mouth daily. 90 tablet 0  . umeclidinium-vilanterol (ANORO ELLIPTA) 62.5-25 MCG/INH AEPB Inhale 1 puff into the lungs daily. 180 each 0   No current facility-administered medications on file prior to visit.     There are no Patient Instructions on file for this visit. Return in about 6 months (around 12/01/2018).   Kris Hartmann, NP  This note was completed with Sales executive.  Any errors are purely unintentional.

## 2018-06-05 ENCOUNTER — Ambulatory Visit: Payer: Self-pay | Admitting: *Deleted

## 2018-06-05 NOTE — Telephone Encounter (Signed)
Called [phone] and number was not in service.

## 2018-06-05 NOTE — Telephone Encounter (Signed)
Pt called with complaints of high blood pressure; he states that his BP on 06/01/18 was 186/105 in the [facility] office; he states that he was told to contact his PCP to have his BP checked; recommendations made per nurse triage protocol; he normally sees Dr Ancil Boozer, [facility],  but she has no availability within parameters per guidelines; pt unable to accept appointment with Suezanne Cheshire at 1540 06/05/18 because is at work; pt offered and accepted appointment with this provider 06/08/18 at 1500; will route to office for notification of this upcoming appointment.   Reason for Disposition . Systolic BP  >= 761 OR Diastolic >= 607  Answer Assessment - Initial Assessment Questions 1. BLOOD PRESSURE: "What is the blood pressure?" "Did you take at least two measurements 5 minutes apart?"   186/105 06/01/18; this is the only reading that pt has 2. ONSET: "When did you take your blood pressure?"   BP cuff at [facility] office 3. HOW: "How did you obtain the blood pressure?" (e.g., visiting nurse, automatic home BP monitor)   Right upper arm at [facility] office 4. HISTORY: "Do you have a history of high blood pressure?" yes 5. MEDICATIONS: "Are you taking any medications for blood pressure?" "Have you missed any doses recently?"     05/30/18 missed a dose  6. OTHER SYMPTOMS: "Do you have any symptoms?" (e.g., headache, chest pain, blurred vision, difficulty breathing, weakness)     no 7. PREGNANCY: "Is there any chance you are pregnant?" "When was your last menstrual period?"     n/a  Protocols used: HIGH BLOOD PRESSURE-A-AH

## 2018-06-05 NOTE — Telephone Encounter (Signed)
Please discuss signs & symptoms of heart attack and stroke and when to go to ER with patient.   Please go to the ER if you experience chest pain, difficulty breathing, sudden numbness or weakness in the face, arm, or leg, especially on one side of the body Sudden confusion, trouble speaking, or difficulty understanding speech Sudden trouble seeing in one or both eyes Sudden trouble walking, dizziness, loss of balance, or lack of coordination Sudden severe headache with no known cause

## 2018-06-08 ENCOUNTER — Encounter: Payer: Self-pay | Admitting: Nurse Practitioner

## 2018-06-08 ENCOUNTER — Ambulatory Visit (INDEPENDENT_AMBULATORY_CARE_PROVIDER_SITE_OTHER): Payer: BLUE CROSS/BLUE SHIELD | Admitting: Nurse Practitioner

## 2018-06-08 VITALS — BP 156/98 | HR 95 | Temp 97.3°F | Resp 16 | Ht 69.0 in | Wt 126.2 lb

## 2018-06-08 DIAGNOSIS — I10 Essential (primary) hypertension: Secondary | ICD-10-CM

## 2018-06-08 MED ORDER — LISINOPRIL 20 MG PO TABS: 20.0000 mg | ORAL_TABLET | Freq: Every day | ORAL | 0 refills | Status: DC

## 2018-06-08 NOTE — Progress Notes (Signed)
Name: Ryan [name]   MRN: [id number]    DOB: October 18, 1955   Date:06/08/2018       Progress Note  Subjective  Chief Complaint  Chief Complaint  Patient presents with  . Hypertension    HPI  Patient presents for evaluation of hypertension takes lisinopril 10mg  daily. States he missed medications a few days prior to this vein and vascular appointment and was very elevated there. States since then took his medicines every day.  Drinks 3-4 cups of coffee a day 5 days a week.  Drinks water sparingly Eats fruits and vegetables daily.  Does not add any salt.  Cut down on canned foods and processed foods.  Drinks 40 ounce of beer 3 times a week.   BP Readings from Last 3 Encounters:  06/08/18 (!) 156/98  06/01/18 (!) 186/105  05/07/18 (!) 141/75     Patient Active Problem List   Diagnosis Date Noted  . Claudication ([place]) 03/17/2018  . B12 deficiency 03/12/2018  . Vitamin B1 deficiency 03/12/2018  . Varicose veins of leg with pain, right 03/12/2018  . 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 Alcoholism /alcohol abuse ([place]) 02/24/2018  . Paresthesia 02/24/2018  . 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 Emphysema lung ([place]) 02/20/2018  . Atherosclerosis of aorta ([place]) 02/20/2018  . Encounter for tobacco use cessation counseling   . Benign neoplasm of cecum   . Benign neoplasm of transverse colon   . Benign neoplasm of sigmoid colon   . Hypertension, benign 08/31/2015  . Tobacco use 08/31/2015  . Abnormal CXR 08/31/2015    Past Medical History:  Diagnosis Date  . Hypertension   . Neuromuscular disorder (HCC)    numbness feet  . Peripheral vascular disease ([place])   . Shortness of breath dyspnea     Past Surgical History:  Procedure Laterality Date  . COLONOSCOPY WITH PROPOFOL N/A 02/15/2016   Procedure: COLONOSCOPY WITH PROPOFOL;  Surgeon: Lucilla Lame, MD;  Location: [facility];  Service: Endoscopy;  Laterality: N/A;  . HERNIA REPAIR  1999   left inguinal  .  LOWER EXTREMITY ANGIOGRAPHY Right 05/07/2018   Procedure: LOWER EXTREMITY ANGIOGRAPHY;  Surgeon: Algernon Huxley, MD;  Location: [facility];  Service: Cardiovascular;  Laterality: Right;  . POLYPECTOMY N/A 02/15/2016   Procedure: POLYPECTOMY;  Surgeon: Lucilla Lame, MD;  Location: [facility];  Service: Endoscopy;  Laterality: N/A;    Social History   Tobacco Use  . Smoking status: Current Every Day Smoker    Packs/day: 1.12    Years: 50.00    Pack years: 56.00    Types: Cigarettes  . Smokeless tobacco: Never Used  . Tobacco comment: trying to cut down   Substance Use Topics  . Alcohol use: Yes    Alcohol/week: 9.0 standard drinks    Types: 9 Cans of beer per week     Current Outpatient Medications:  .  aspirin EC 81 MG tablet, Take 1 tablet (81 mg total) by mouth daily., Disp: 30 tablet, Rfl: 0 .  atorvastatin (LIPITOR) 20 MG tablet, Take 1 tablet (20 mg total) by mouth daily., Disp: 90 tablet, Rfl: 0 .  clopidogrel (PLAVIX) 75 MG tablet, Take 1 tablet (75 mg total) by mouth daily., Disp: 30 tablet, Rfl: 11 .  Cyanocobalamin (VITAMIN B-12) 1000 MCG SUBL, Place 1 tablet (1,000 mcg total) under the tongue daily., Disp: 90 tablet, Rfl: 0 .  lisinopril (PRINIVIL,ZESTRIL) 10 MG tablet, Take 1 tablet (10 mg total) by  mouth daily., Disp: 90 tablet, Rfl: 0 .  umeclidinium-vilanterol (ANORO ELLIPTA) 62.5-25 MCG/INH AEPB, Inhale 1 puff into the lungs daily., Disp: 180 each, Rfl: 0  No Known Allergies  ROS  No other specific complaints in a complete review of systems (except as listed in HPI above).  Objective  Vitals:   06/08/18 1507 06/08/18 1514  BP: (!) 154/92 (!) 156/98  Pulse: 95   Resp: 16   Temp: (!) 97.3 F (36.3 C)   TempSrc: Oral   SpO2: 95%   Weight: 126 lb 3.2 oz (57.2 kg)   Height: 5\' 9"  (1.753 m)     Body mass index is 18.64 kg/m.  Nursing Note and Vital Signs reviewed.  Physical Exam Constitutional:      Appearance: Normal appearance. He  is well-developed.  HENT:     Head: Normocephalic and atraumatic.     Right Ear: Hearing normal.     Left Ear: Hearing normal.  Eyes:     Conjunctiva/sclera: Conjunctivae normal.  Neck:     Vascular: No carotid bruit.  Cardiovascular:     Rate and Rhythm: Normal rate and regular rhythm.     Heart sounds: Normal heart sounds.  Pulmonary:     Effort: Pulmonary effort is normal.     Breath sounds: Normal breath sounds.  Musculoskeletal: Normal range of motion.  Neurological:     Mental Status: He is alert and oriented to person, place, and time.  Psychiatric:        Speech: Speech normal.        Behavior: Behavior normal. Behavior is cooperative.        Thought Content: Thought content normal.        Judgment: Judgment normal.      No results found for this or any previous visit (from the past 48 hour(s)).  Assessment & Plan   1. Hypertension, benign Discussed DASH and risk for angioedema with ACEi; has been on it in the past without issues.  - lisinopril (PRINIVIL,ZESTRIL) 20 MG tablet; Take 1 tablet (20 mg total) by mouth daily.  Dispense: 90 tablet; Refill: 0  -Red flags and when to present for emergency care or RTC including fever >101.24F, chest pain, shortness of breath, new/worsening/un-resolving symptoms, reviewed with patient at time of visit. Follow up and care instructions discussed and provided in AVS.

## 2018-06-08 NOTE — Patient Instructions (Addendum)
- Please check your blood pressures when resting 3 time a week. Our goal is for the top number to be under 140 mmHG and the bottom number to be under 4mmHG - We are increasing your lisinopril from 10mg  a day to 20mg  a day (take 2 pills of your 10mg  a day in the morning) Your new prescription is for the 20mg  a day (only take 1 pill a day) - We will follow-up in 2 weeks to ensure your blood pressure is improved and check your kidney function.   DASH Eating Plan DASH stands for "Dietary Approaches to Stop Hypertension." The DASH eating plan is a healthy eating plan that has been shown to reduce high blood pressure (hypertension). It may also reduce your risk for type 2 diabetes, heart disease, and stroke. The DASH eating plan may also help with weight loss. What are tips for following this plan? General guidelines  Avoid eating more than 2,300 mg (milligrams) of salt (sodium) a day. If you have hypertension, you may need to reduce your sodium intake to 1,500 mg a day.  Limit alcohol intake to no more than 1 drink a day for nonpregnant women and 2 drinks a day for men. One drink equals 12 oz of beer, 5 oz of wine, or 1 oz of hard liquor.  Work with your health care provider to maintain a healthy body weight or to lose weight. Ask what an ideal weight is for you.  Get at least 30 minutes of exercise that causes your heart to beat faster (aerobic exercise) most days of the week. Activities may include walking, swimming, or biking.  Work with your health care provider or diet and nutrition specialist (dietitian) to adjust your eating plan to your individual calorie needs. Reading food labels  Check food labels for the amount of sodium per serving. Choose foods with less than 5 percent of the Daily Value of sodium. Generally, foods with less than 300 mg of sodium per serving fit into this eating plan.  To find whole grains, look for the word "whole" as the first word in the ingredient  list. Shopping  Buy products labeled as "low-sodium" or "no salt added."  Buy fresh foods. Avoid canned foods and premade or frozen meals. Cooking  Avoid adding salt when cooking. Use salt-free seasonings or herbs instead of table salt or sea salt. Check with your health care provider or pharmacist before using salt substitutes.  Do not fry foods. Cook foods using healthy methods such as baking, boiling, grilling, and broiling instead.  Cook with heart-healthy oils, such as olive, canola, soybean, or sunflower oil. Meal planning   Eat a balanced diet that includes: ? 5 or more servings of fruits and vegetables each day. At each meal, try to fill half of your plate with fruits and vegetables. ? Up to 6-8 servings of whole grains each day. ? Less than 6 oz of lean meat, poultry, or fish each day. A 3-oz serving of meat is about the same size as a deck of cards. One egg equals 1 oz. ? 2 servings of low-fat dairy each day. ? A serving of nuts, seeds, or beans 5 times each week. ? Heart-healthy fats. Healthy fats called Omega-3 fatty acids are found in foods such as flaxseeds and coldwater fish, like sardines, salmon, and mackerel.  Limit how much you eat of the following: ? Canned or prepackaged foods. ? Food that is high in trans fat, such as fried foods. ? Food that  is high in saturated fat, such as fatty meat. ? Sweets, desserts, sugary drinks, and other foods with added sugar. ? Full-fat dairy products.  Do not salt foods before eating.  Try to eat at least 2 vegetarian meals each week.  Eat more home-cooked food and less restaurant, buffet, and fast food.  When eating at a restaurant, ask that your food be prepared with less salt or no salt, if possible. What foods are recommended? The items listed may not be a complete list. Talk with your dietitian about what dietary choices are best for you. Grains Whole-grain or whole-wheat bread. Whole-grain or whole-wheat pasta. Brown  rice. Oatmeal. Quinoa. Bulgur. Whole-grain and low-sodium cereals. Pita bread. Low-fat, low-sodium crackers. Whole-wheat flour tortillas. Vegetables Fresh or frozen vegetables (raw, steamed, roasted, or grilled). Low-sodium or reduced-sodium tomato and vegetable juice. Low-sodium or reduced-sodium tomato sauce and tomato paste. Low-sodium or reduced-sodium canned vegetables. Fruits All fresh, dried, or frozen fruit. Canned fruit in natural juice (without added sugar). Meat and other protein foods Skinless chicken or turkey. Ground chicken or turkey. Pork with fat trimmed off. Fish and seafood. Egg whites. Dried beans, peas, or lentils. Unsalted nuts, nut butters, and seeds. Unsalted canned beans. Lean cuts of beef with fat trimmed off. Low-sodium, lean deli meat. Dairy Low-fat (1%) or fat-free (skim) milk. Fat-free, low-fat, or reduced-fat cheeses. Nonfat, low-sodium ricotta or cottage cheese. Low-fat or nonfat yogurt. Low-fat, low-sodium cheese. Fats and oils Soft margarine without trans fats. Vegetable oil. Low-fat, reduced-fat, or light mayonnaise and salad dressings (reduced-sodium). Canola, safflower, olive, soybean, and sunflower oils. Avocado. Seasoning and other foods Herbs. Spices. Seasoning mixes without salt. Unsalted popcorn and pretzels. Fat-free sweets. What foods are not recommended? The items listed may not be a complete list. Talk with your dietitian about what dietary choices are best for you. Grains Baked goods made with fat, such as croissants, muffins, or some breads. Dry pasta or rice meal packs. Vegetables Creamed or fried vegetables. Vegetables in a cheese sauce. Regular canned vegetables (not low-sodium or reduced-sodium). Regular canned tomato sauce and paste (not low-sodium or reduced-sodium). Regular tomato and vegetable juice (not low-sodium or reduced-sodium). Pickles. Olives. Fruits Canned fruit in a light or heavy syrup. Fried fruit. Fruit in cream or butter  sauce. Meat and other protein foods Fatty cuts of meat. Ribs. Fried meat. Bacon. Sausage. Bologna and other processed lunch meats. Salami. Fatback. Hotdogs. Bratwurst. Salted nuts and seeds. Canned beans with added salt. Canned or smoked fish. Whole eggs or egg yolks. Chicken or turkey with skin. Dairy Whole or 2% milk, cream, and half-and-half. Whole or full-fat cream cheese. Whole-fat or sweetened yogurt. Full-fat cheese. Nondairy creamers. Whipped toppings. Processed cheese and cheese spreads. Fats and oils Butter. Stick margarine. Lard. Shortening. Ghee. Bacon fat. Tropical oils, such as coconut, palm kernel, or palm oil. Seasoning and other foods Salted popcorn and pretzels. Onion salt, garlic salt, seasoned salt, table salt, and sea salt. Worcestershire sauce. Tartar sauce. Barbecue sauce. Teriyaki sauce. Soy sauce, including reduced-sodium. Steak sauce. Canned and packaged gravies. Fish sauce. Oyster sauce. Cocktail sauce. Horseradish that you find on the shelf. Ketchup. Mustard. Meat flavorings and tenderizers. Bouillon cubes. Hot sauce and Tabasco sauce. Premade or packaged marinades. Premade or packaged taco seasonings. Relishes. Regular salad dressings. Where to find more information:  National Heart, Lung, and Blood Institute: www.nhlbi.nih.gov  American Heart Association: www.heart.org Summary  The DASH eating plan is a healthy eating plan that has been shown to reduce high blood pressure (hypertension).   It may also reduce your risk for type 2 diabetes, heart disease, and stroke.  With the DASH eating plan, you should limit salt (sodium) intake to 2,300 mg a day. If you have hypertension, you may need to reduce your sodium intake to 1,500 mg a day.  When on the DASH eating plan, aim to eat more fresh fruits and vegetables, whole grains, lean proteins, low-fat dairy, and heart-healthy fats.  Work with your health care provider or diet and nutrition specialist (dietitian) to adjust  your eating plan to your individual calorie needs. This information is not intended to replace advice given to you by your health care provider. Make sure you discuss any questions you have with your health care provider. Document Released: 05/30/2011 Document Revised: 06/03/2016 Document Reviewed: 06/03/2016 Elsevier Interactive Patient Education  [employer].

## 2018-06-10 ENCOUNTER — Ambulatory Visit: Payer: BLUE CROSS/BLUE SHIELD | Admitting: Family Medicine

## 2018-06-22 ENCOUNTER — Ambulatory Visit (INDEPENDENT_AMBULATORY_CARE_PROVIDER_SITE_OTHER): Payer: BLUE CROSS/BLUE SHIELD

## 2018-06-22 ENCOUNTER — Ambulatory Visit (INDEPENDENT_AMBULATORY_CARE_PROVIDER_SITE_OTHER): Payer: BLUE CROSS/BLUE SHIELD | Admitting: Nurse Practitioner

## 2018-06-22 ENCOUNTER — Encounter: Payer: Self-pay | Admitting: Nurse Practitioner

## 2018-06-22 ENCOUNTER — Ambulatory Visit: Payer: BLUE CROSS/BLUE SHIELD | Admitting: Nurse Practitioner

## 2018-06-22 ENCOUNTER — Encounter (INDEPENDENT_AMBULATORY_CARE_PROVIDER_SITE_OTHER): Payer: Self-pay | Admitting: Nurse Practitioner

## 2018-06-22 ENCOUNTER — Other Ambulatory Visit (INDEPENDENT_AMBULATORY_CARE_PROVIDER_SITE_OTHER): Payer: Self-pay | Admitting: Nurse Practitioner

## 2018-06-22 ENCOUNTER — Telehealth (INDEPENDENT_AMBULATORY_CARE_PROVIDER_SITE_OTHER): Payer: Self-pay

## 2018-06-22 VITALS — BP 172/108 | HR 88 | Temp 98.6°F | Resp 16 | Ht 69.0 in | Wt 125.7 lb

## 2018-06-22 VITALS — BP 196/114 | HR 83 | Resp 12 | Ht 68.0 in | Wt 125.4 lb

## 2018-06-22 DIAGNOSIS — R200 Anesthesia of skin: Secondary | ICD-10-CM | POA: Diagnosis not present

## 2018-06-22 DIAGNOSIS — Z720 Tobacco use: Secondary | ICD-10-CM

## 2018-06-22 DIAGNOSIS — I10 Essential (primary) hypertension: Secondary | ICD-10-CM

## 2018-06-22 DIAGNOSIS — I739 Peripheral vascular disease, unspecified: Secondary | ICD-10-CM | POA: Diagnosis not present

## 2018-06-22 DIAGNOSIS — R202 Paresthesia of skin: Secondary | ICD-10-CM

## 2018-06-22 DIAGNOSIS — F17210 Nicotine dependence, cigarettes, uncomplicated: Secondary | ICD-10-CM

## 2018-06-22 DIAGNOSIS — I83811 Varicose veins of right lower extremities with pain: Secondary | ICD-10-CM

## 2018-06-22 DIAGNOSIS — Z95820 Peripheral vascular angioplasty status with implants and grafts: Secondary | ICD-10-CM

## 2018-06-22 DIAGNOSIS — Z716 Tobacco abuse counseling: Secondary | ICD-10-CM | POA: Diagnosis not present

## 2018-06-22 MED ORDER — NICOTINE POLACRILEX 2 MG MT GUM: 2.0000 mg | CHEWING_GUM | OROMUCOSAL | 0 refills | Status: DC | PRN

## 2018-06-22 MED ORDER — CHLORTHALIDONE 25 MG PO TABS: 25.0000 mg | ORAL_TABLET | Freq: Every day | ORAL | 1 refills | Status: DC

## 2018-06-22 NOTE — Progress Notes (Signed)
Name: Ryan [name]   MRN: [id number]    DOB: 23-Jul-1955   Date:06/22/2018       Progress Note  Subjective  Chief Complaint  Chief Complaint  Patient presents with  . Follow-up    2 week f/u   . Hypertension    HPI  Patient here for follow up on blood pressure, increased his lisinopril from 10mg  daily to 20mg  daily 2 weeks ago- no missed doses. States checked it in between last visit at [facility] and got 150/90. endorses mild headache a few days ago that self-resolved. Denies blurry vision, chest pain.  States was having worsening claudication in right leg- had stent placed in right leg in November- states that's when blood pressure was noted to increase.  Does not take NSAIDs, steroids, decongestants. Drinks a beer every night, smokes 5-10 cigarettes a day.   Denies palpitations, diaphoresis, foamy urine, cold/heat intolerances, fatigue, irritability.   BP Readings from Last 3 Encounters:  06/22/18 (!) 172/108  06/08/18 (!) 156/98  06/01/18 (!) 186/105     Patient Active Problem List   Diagnosis Date Noted  . Claudication ([place]) 03/17/2018  . B12 deficiency 03/12/2018  . Vitamin B1 deficiency 03/12/2018  . Varicose veins of leg with pain, right 03/12/2018  . Enlarged thoracic aorta ([place]) 02/24/2018  . Alcoholism /alcohol abuse ([place]) 02/24/2018  . Paresthesia 02/24/2018  . 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 Emphysema lung ([place]) 02/20/2018  . Atherosclerosis of aorta ([place]) 02/20/2018  . Encounter for tobacco use cessation counseling   . Benign neoplasm of cecum   . Benign neoplasm of transverse colon   . Benign neoplasm of sigmoid colon   . Hypertension, benign 08/31/2015  . Tobacco use 08/31/2015  . Abnormal CXR 08/31/2015    Past Medical History:  Diagnosis Date  . Hypertension   . Neuromuscular disorder ([facility])    numbness feet  . Peripheral vascular disease ([place])   . Shortness of breath dyspnea     Past Surgical History:  Procedure Laterality Date  .  COLONOSCOPY WITH PROPOFOL N/A 02/15/2016   Procedure: COLONOSCOPY WITH PROPOFOL;  Surgeon: Lucilla Lame, MD;  Location: [facility];  Service: Endoscopy;  Laterality: N/A;  . HERNIA REPAIR  1999   left inguinal  . LOWER EXTREMITY ANGIOGRAPHY Right 05/07/2018   Procedure: LOWER EXTREMITY ANGIOGRAPHY;  Surgeon: Algernon Huxley, MD;  Location: [facility];  Service: Cardiovascular;  Laterality: Right;  . POLYPECTOMY N/A 02/15/2016   Procedure: POLYPECTOMY;  Surgeon: Lucilla Lame, MD;  Location: [facility];  Service: Endoscopy;  Laterality: N/A;    Social History   Tobacco Use  . Smoking status: Current Every Day Smoker    Packs/day: 1.12    Years: 50.00    Pack years: 56.00    Types: Cigarettes  . Smokeless tobacco: Never Used  . Tobacco comment: trying to cut down   Substance Use Topics  . Alcohol use: Yes    Alcohol/week: 9.0 standard drinks    Types: 9 Cans of beer per week     Current Outpatient Medications:  .  aspirin EC 81 MG tablet, Take 1 tablet (81 mg total) by mouth daily., Disp: 30 tablet, Rfl: 0 .  atorvastatin (LIPITOR) 20 MG tablet, Take 1 tablet (20 mg total) by mouth daily., Disp: 90 tablet, Rfl: 0 .  clopidogrel (PLAVIX) 75 MG tablet, Take 1 tablet (75 mg total) by mouth daily., Disp: 30 tablet, Rfl: 11 .  Cyanocobalamin (VITAMIN B-12) 1000 MCG  SUBL, Place 1 tablet (1,000 mcg total) under the tongue daily., Disp: 90 tablet, Rfl: 0 .  lisinopril (PRINIVIL,ZESTRIL) 20 MG tablet, Take 1 tablet (20 mg total) by mouth daily., Disp: 90 tablet, Rfl: 0 .  umeclidinium-vilanterol (ANORO ELLIPTA) 62.5-25 MCG/INH AEPB, Inhale 1 puff into the lungs daily., Disp: 180 each, Rfl: 0  No Known Allergies  ROS   No other specific complaints in a complete review of systems (except as listed in HPI above).  Objective  Vitals:   06/22/18 1440  BP: (!) 168/98  Pulse: 88  Resp: 16  Temp: 98.6 F (37 C)  TempSrc: Oral  SpO2: 99%  Weight: 125 lb 11.2 oz  (57 kg)  Height: 5\' 9"  (1.753 m)    Body mass index is 18.56 kg/m.  Nursing Note and Vital Signs reviewed.  Physical Exam Vitals signs reviewed.  Constitutional:      Appearance: He is well-developed.  HENT:     Head: Normocephalic and atraumatic.  Neck:     Musculoskeletal: Normal range of motion and neck supple.     Vascular: No carotid bruit.  Cardiovascular:     Heart sounds: Normal heart sounds.  Pulmonary:     Effort: Pulmonary effort is normal.     Breath sounds: Normal breath sounds.  Musculoskeletal: Normal range of motion.  Skin:    General: Skin is warm and dry.     Capillary Refill: Capillary refill takes less than 2 seconds.  Neurological:     Mental Status: He is alert and oriented to person, place, and time.     GCS: GCS eye subscore is 4. GCS verbal subscore is 5. GCS motor subscore is 6.     Sensory: No sensory deficit.  Psychiatric:        Speech: Speech normal.        Behavior: Behavior normal.        Thought Content: Thought content normal.        Judgment: Judgment normal.       No results found for this or any previous visit (from the past 48 hour(s)).  Assessment & Plan  1. Hypertension, benign DASH, continue lisinopril 20mg  add on chlorthalidone, evaluate for secondary causes for increase in blood pressure, follow up in 2 weeks for BP recheck.  - chlorthalidone (HYGROTON) 25 MG tablet; Take 1 tablet (25 mg total) by mouth daily.  Dispense: 30 tablet; Refill: 1 - COMPLETE METABOLIC PANEL WITH GFR - TSH  2. Encounter for smoking cessation counseling - nicotine polacrilex (NICORETTE) 2 MG gum; Take 1 each (2 mg total) by mouth as needed for smoking cessation.  Dispense: 100 tablet; Refill: 0  3. Tobacco abuse Spent greater than 3 minutes discussing cessation plan - nicotine polacrilex (NICORETTE) 2 MG gum; Take 1 each (2 mg total) by mouth as needed for smoking cessation.  Dispense: 100 tablet; Refill: 0

## 2018-06-22 NOTE — Telephone Encounter (Signed)
Patient came in to be seen 

## 2018-06-22 NOTE — Patient Instructions (Signed)
- cut down on salt and red meats (things that come from cow or pig) drink plenty of water Www.quitnow.com Coping with Quitting Smoking  Quitting smoking is a physical and mental challenge. You will face cravings, withdrawal symptoms, and temptation. Before quitting, work with your health care provider to make a plan that can help you cope. Preparation can help you quit and keep you from giving in. How can I cope with cravings? Cravings usually last for 5-10 minutes. If you get through it, the craving will pass. Consider taking the following actions to help you cope with cravings:  Keep your mouth busy: ? Chew sugar-free gum. ? Suck on hard candies or a straw. ? Brush your teeth.  Keep your hands and body busy: ? Immediately change to a different activity when you feel a craving. ? Squeeze or play with a ball. ? Do an activity or a hobby, like making bead jewelry, practicing needlepoint, or working with wood. ? Mix up your normal routine. ? Take a short exercise break. Go for a quick walk or run up and down stairs. ? Spend time in public places where smoking is not allowed.  Focus on doing something kind or helpful for someone else.  Call a friend or family member to talk during a craving.  Join a support group.  Call a quit line, such as 1-800-QUIT-NOW.  Talk with your health care provider about medicines that might help you cope with cravings and make quitting easier for you. How can I deal with withdrawal symptoms? Your body may experience negative effects as it tries to get used to not having nicotine in the system. These effects are called withdrawal symptoms. They may include:  Feeling hungrier than normal.  Trouble concentrating.  Irritability.  Trouble sleeping.  Feeling depressed.  Restlessness and agitation.  Craving a cigarette. To manage withdrawal symptoms:  Avoid places, people, and activities that trigger your cravings.  Remember why you want to  quit.  Get plenty of sleep.  Avoid coffee and other caffeinated drinks. These may worsen some of your symptoms. How can I handle social situations? Social situations can be difficult when you are quitting smoking, especially in the first few weeks. To manage this, you can:  Avoid parties, bars, and other social situations where people might be smoking.  Avoid alcohol.  Leave right away if you have the urge to smoke.  Explain to your family and friends that you are quitting smoking. Ask for understanding and support.  Plan activities with friends or family where smoking is not an option. What are some ways I can cope with stress? Wanting to smoke may cause stress, and stress can make you want to smoke. Find ways to manage your stress. Relaxation techniques can help. For example:  Breathe slowly and deeply, in through your nose and out through your mouth.  Listen to soothing, relaxing music.  Talk with a family member or friend about your stress.  Light a candle.  Soak in a bath or take a shower.  Think about a peaceful place. What are some ways I can prevent weight gain? Be aware that many people gain weight after they quit smoking. However, not everyone does. To keep from gaining weight, have a plan in place before you quit and stick to the plan after you quit. Your plan should include:  Having healthy snacks. When you have a craving, it may help to: ? Eat plain popcorn, crunchy carrots, celery, or other cut vegetables. ?  Chew sugar-free gum.  Changing how you eat: ? Eat small portion sizes at meals. ? Eat 4-6 small meals throughout the day instead of 1-2 large meals a day. ? Be mindful when you eat. Do not watch television or do other things that might distract you as you eat.  Exercising regularly: ? Make time to exercise each day. If you do not have time for a long workout, do short bouts of exercise for 5-10 minutes several times a day. ? Do some form of strengthening  exercise, like weight lifting, and some form of aerobic exercise, like running or swimming.  Drinking plenty of water or other low-calorie or no-calorie drinks. Drink 6-8 glasses of water daily, or as much as instructed by your health care provider. Summary  Quitting smoking is a physical and mental challenge. You will face cravings, withdrawal symptoms, and temptation to smoke again. Preparation can help you as you go through these challenges.  You can cope with cravings by keeping your mouth busy (such as by chewing gum), keeping your body and hands busy, and making calls to family, friends, or a helpline for people who want to quit smoking.  You can cope with withdrawal symptoms by avoiding places where people smoke, avoiding drinks with caffeine, and getting plenty of rest.  Ask your health care provider about the different ways to prevent weight gain, avoid stress, and handle social situations. This information is not intended to replace advice given to you by your health care provider. Make sure you discuss any questions you have with your health care provider. Document Released: 06/07/2016 Document Revised: 06/07/2016 Document Reviewed: 06/07/2016 Elsevier Interactive Patient Education  2019 [employer].

## 2018-06-23 ENCOUNTER — Other Ambulatory Visit (INDEPENDENT_AMBULATORY_CARE_PROVIDER_SITE_OTHER): Payer: Self-pay | Admitting: Nurse Practitioner

## 2018-06-23 LAB — COMPLETE METABOLIC PANEL WITH GFR
AG Ratio: 1.1 (calc) (ref 1.0–2.5)
ALT: 10 U/L (ref 9–46)
AST: 20 U/L (ref 10–35)
Albumin: 3.9 g/dL (ref 3.6–5.1)
Alkaline phosphatase (APISO): 90 U/L (ref 40–115)
BUN/Creatinine Ratio: 14 (calc) (ref 6–22)
BUN: 20 mg/dL (ref 7–25)
CALCIUM: 9.2 mg/dL (ref 8.6–10.3)
CO2: 24 mmol/L (ref 20–32)
Chloride: 107 mmol/L (ref 98–110)
Creat: 1.46 mg/dL — ABNORMAL HIGH (ref 0.70–1.25)
GFR, EST AFRICAN AMERICAN: 59 mL/min/{1.73_m2} — AB (ref >=60)
GFR, EST NON AFRICAN AMERICAN: 51 mL/min/{1.73_m2} — AB (ref >=60)
Globulin: 3.4 g/dL (calc) (ref 1.9–3.7)
Glucose, Bld: 87 mg/dL (ref 65–99)
POTASSIUM: 5 mmol/L (ref 3.5–5.3)
Sodium: 139 mmol/L (ref 135–146)
Total Bilirubin: 0.4 mg/dL (ref 0.2–1.2)
Total Protein: 7.3 g/dL (ref 6.1–8.1)

## 2018-06-23 LAB — TSH: TSH: 1.83 mIU/L (ref 0.40–4.50)

## 2018-06-25 ENCOUNTER — Encounter
Admission: RE | Disposition: A | Discharge status: Home / Self Care | Payer: Self-pay | Source: Home / Self Care | Attending: Vascular Surgery

## 2018-06-25 ENCOUNTER — Encounter (INDEPENDENT_AMBULATORY_CARE_PROVIDER_SITE_OTHER): Payer: Self-pay | Admitting: Nurse Practitioner

## 2018-06-25 ENCOUNTER — Other Ambulatory Visit: Payer: Self-pay

## 2018-06-25 ENCOUNTER — Observation Stay
Admission: RE | Admit: 2018-06-25 | Discharge: 2018-06-26 | Disposition: A | Discharge status: Home / Self Care | Payer: BLUE CROSS/BLUE SHIELD | Attending: Vascular Surgery | Admitting: Vascular Surgery

## 2018-06-25 DIAGNOSIS — Z720 Tobacco use: Secondary | ICD-10-CM

## 2018-06-25 DIAGNOSIS — Z955 Presence of coronary angioplasty implant and graft: Secondary | ICD-10-CM | POA: Insufficient documentation

## 2018-06-25 DIAGNOSIS — Z7902 Long term (current) use of antithrombotics/antiplatelets: Secondary | ICD-10-CM | POA: Diagnosis not present

## 2018-06-25 DIAGNOSIS — I70221 Atherosclerosis of native arteries of extremities with rest pain, right leg: Principal | ICD-10-CM | POA: Insufficient documentation

## 2018-06-25 DIAGNOSIS — Z8249 Family history of ischemic heart disease and other diseases of the circulatory system: Secondary | ICD-10-CM | POA: Diagnosis not present

## 2018-06-25 DIAGNOSIS — F17210 Nicotine dependence, cigarettes, uncomplicated: Secondary | ICD-10-CM | POA: Insufficient documentation

## 2018-06-25 DIAGNOSIS — I998 Other disorder of circulatory system: Secondary | ICD-10-CM | POA: Diagnosis not present

## 2018-06-25 DIAGNOSIS — I10 Essential (primary) hypertension: Secondary | ICD-10-CM | POA: Insufficient documentation

## 2018-06-25 DIAGNOSIS — I70229 Atherosclerosis of native arteries of extremities with rest pain, unspecified extremity: Secondary | ICD-10-CM

## 2018-06-25 DIAGNOSIS — G709 Myoneural disorder, unspecified: Secondary | ICD-10-CM | POA: Insufficient documentation

## 2018-06-25 DIAGNOSIS — Z716 Tobacco abuse counseling: Secondary | ICD-10-CM

## 2018-06-25 DIAGNOSIS — Z7982 Long term (current) use of aspirin: Secondary | ICD-10-CM | POA: Diagnosis not present

## 2018-06-25 DIAGNOSIS — I83811 Varicose veins of right lower extremities with pain: Secondary | ICD-10-CM | POA: Diagnosis not present

## 2018-06-25 DIAGNOSIS — Z79899 Other long term (current) drug therapy: Secondary | ICD-10-CM | POA: Diagnosis not present

## 2018-06-25 HISTORY — PX: LOWER EXTREMITY ANGIOGRAPHY: CATH118251

## 2018-06-25 LAB — CREATININE, SERUM
Creatinine, Ser: 1.58 mg/dL — ABNORMAL HIGH (ref 0.61–1.24)
GFR calc Af Amer: 54 mL/min — ABNORMAL LOW (ref >60)
GFR, EST NON AFRICAN AMERICAN: 47 mL/min — AB (ref >60)

## 2018-06-25 LAB — BUN: BUN: 29 mg/dL — ABNORMAL HIGH (ref 8–23)

## 2018-06-25 SURGERY — LOWER EXTREMITY ANGIOGRAPHY
Anesthesia: Moderate Sedation | Laterality: Right

## 2018-06-25 MED ORDER — NICOTINE 14 MG/24HR TD PT24
14.0000 mg | MEDICATED_PATCH | Freq: Every day | TRANSDERMAL | Status: DC
  Administered 2018-06-25 – 2018-06-26 (×2): 14 mg via TRANSDERMAL
  Filled 2018-06-25 (×2): qty 1

## 2018-06-25 MED ORDER — ACETAMINOPHEN 325 MG PO TABS
650.0000 mg | ORAL_TABLET | Freq: Four times a day (QID) | ORAL | Status: DC | PRN
  Administered 2018-06-25: 650 mg via ORAL
  Filled 2018-06-25: qty 2

## 2018-06-25 MED ORDER — ACETAMINOPHEN 650 MG RE SUPP: 650.0000 mg | Freq: Four times a day (QID) | RECTAL | Status: DC | PRN

## 2018-06-25 MED ORDER — UMECLIDINIUM-VILANTEROL 62.5-25 MCG/INH IN AEPB
1.0000 | INHALATION_SPRAY | Freq: Every day | RESPIRATORY_TRACT | Status: DC
  Administered 2018-06-26: 1 via RESPIRATORY_TRACT
  Filled 2018-06-25: qty 14

## 2018-06-25 MED ORDER — MIDAZOLAM HCL 2 MG/2ML IJ SOLN
INTRAMUSCULAR | Status: DC | PRN
  Administered 2018-06-25: 2 mg via INTRAVENOUS
  Administered 2018-06-25 (×3): 1 mg via INTRAVENOUS

## 2018-06-25 MED ORDER — DEXTROSE-NACL 5-0.9 % IV SOLN: INTRAVENOUS | Status: DC

## 2018-06-25 MED ORDER — HEPARIN (PORCINE) IN NACL 1000-0.9 UT/500ML-% IV SOLN
INTRAVENOUS | Status: AC
  Filled 2018-06-25: qty 1000

## 2018-06-25 MED ORDER — TIROFIBAN (AGGRASTAT) BOLUS VIA INFUSION
25.0000 ug/kg | Freq: Once | INTRAVENOUS | Status: AC
  Administered 2018-06-25: 1530 ug via INTRAVENOUS
  Filled 2018-06-25: qty 31

## 2018-06-25 MED ORDER — FENTANYL CITRATE (PF) 100 MCG/2ML IJ SOLN
INTRAMUSCULAR | Status: AC
  Filled 2018-06-25: qty 2

## 2018-06-25 MED ORDER — TIROFIBAN HCL IN NACL 5-0.9 MG/100ML-% IV SOLN
0.1500 ug/kg/min | INTRAVENOUS | Status: AC
  Administered 2018-06-25: 0.15 ug/kg/min via INTRAVENOUS
  Filled 2018-06-25 (×2): qty 100

## 2018-06-25 MED ORDER — SODIUM CHLORIDE (PF) 0.9 % IJ SOLN
INTRAMUSCULAR | Status: AC
  Filled 2018-06-25: qty 50

## 2018-06-25 MED ORDER — HYDROMORPHONE HCL 1 MG/ML IJ SOLN: 1.0000 mg | Freq: Once | INTRAMUSCULAR | Status: DC | PRN

## 2018-06-25 MED ORDER — NITROGLYCERIN 1 MG/10 ML FOR IR/CATH LAB
INTRA_ARTERIAL | Status: DC | PRN
  Administered 2018-06-25: 250 ug

## 2018-06-25 MED ORDER — MAGNESIUM HYDROXIDE 400 MG/5ML PO SUSP: 30.0000 mL | Freq: Every day | ORAL | Status: DC | PRN

## 2018-06-25 MED ORDER — ATORVASTATIN CALCIUM 20 MG PO TABS
20.0000 mg | ORAL_TABLET | Freq: Every day | ORAL | Status: DC
  Administered 2018-06-26: 20 mg via ORAL
  Filled 2018-06-25: qty 1

## 2018-06-25 MED ORDER — HYDROCODONE-ACETAMINOPHEN 5-325 MG PO TABS
1.0000 | ORAL_TABLET | ORAL | Status: DC | PRN
  Administered 2018-06-25 – 2018-06-26 (×3): 1 via ORAL
  Filled 2018-06-25: qty 1
  Filled 2018-06-25: qty 2
  Filled 2018-06-25: qty 1

## 2018-06-25 MED ORDER — SORBITOL 70 % SOLN
30.0000 mL | Freq: Every day | Status: DC | PRN
  Filled 2018-06-25: qty 30

## 2018-06-25 MED ORDER — DEXTROSE 5 % IV SOLN
2.0000 g | Freq: Once | INTRAVENOUS | Status: DC
  Filled 2018-06-25: qty 20

## 2018-06-25 MED ORDER — DIPHENHYDRAMINE HCL 50 MG/ML IJ SOLN
INTRAMUSCULAR | Status: AC
  Filled 2018-06-25: qty 1

## 2018-06-25 MED ORDER — SODIUM CHLORIDE 0.9 % IV SOLN
INTRAVENOUS | Status: DC
  Administered 2018-06-25 – 2018-06-26 (×2): via INTRAVENOUS

## 2018-06-25 MED ORDER — HYDRALAZINE HCL 20 MG/ML IJ SOLN
INTRAMUSCULAR | Status: AC
  Filled 2018-06-25: qty 1

## 2018-06-25 MED ORDER — ALTEPLASE 2 MG IJ SOLR
INTRAMUSCULAR | Status: DC | PRN
  Administered 2018-06-25: 8 mg

## 2018-06-25 MED ORDER — ONDANSETRON HCL 4 MG/2ML IJ SOLN: 4.0000 mg | Freq: Four times a day (QID) | INTRAMUSCULAR | Status: DC | PRN

## 2018-06-25 MED ORDER — HYDRALAZINE HCL 20 MG/ML IJ SOLN
INTRAMUSCULAR | Status: DC | PRN
  Administered 2018-06-25 (×2): 10 mg via INTRAVENOUS

## 2018-06-25 MED ORDER — ASPIRIN EC 81 MG PO TBEC
81.0000 mg | DELAYED_RELEASE_TABLET | Freq: Every day | ORAL | Status: DC
  Administered 2018-06-26: 81 mg via ORAL
  Filled 2018-06-25: qty 1

## 2018-06-25 MED ORDER — TIROFIBAN HCL IV 12.5 MG/250 ML
INTRAVENOUS | Status: AC
  Administered 2018-06-25: 0.15 ug/kg/min via INTRAVENOUS
  Filled 2018-06-25: qty 250

## 2018-06-25 MED ORDER — SODIUM CHLORIDE 0.9 % IV SOLN
INTRAVENOUS | Status: DC
  Administered 2018-06-25: 10:00:00 via INTRAVENOUS

## 2018-06-25 MED ORDER — FENTANYL CITRATE (PF) 100 MCG/2ML IJ SOLN
INTRAMUSCULAR | Status: DC | PRN
  Administered 2018-06-25: 25 ug via INTRAVENOUS
  Administered 2018-06-25: 50 ug via INTRAVENOUS
  Administered 2018-06-25 (×2): 25 ug via INTRAVENOUS
  Administered 2018-06-25: 50 ug via INTRAVENOUS
  Administered 2018-06-25: 25 ug via INTRAVENOUS

## 2018-06-25 MED ORDER — HEPARIN SODIUM (PORCINE) 1000 UNIT/ML IJ SOLN
INTRAMUSCULAR | Status: DC | PRN
  Administered 2018-06-25: 5000 [IU] via INTRAVENOUS

## 2018-06-25 MED ORDER — LIDOCAINE-EPINEPHRINE (PF) 1 %-1:200000 IJ SOLN
INTRAMUSCULAR | Status: AC
  Filled 2018-06-25: qty 10

## 2018-06-25 MED ORDER — CHLORTHALIDONE 25 MG PO TABS
25.0000 mg | ORAL_TABLET | Freq: Every day | ORAL | Status: DC
  Administered 2018-06-26: 25 mg via ORAL
  Filled 2018-06-25 (×2): qty 1

## 2018-06-25 MED ORDER — NITROGLYCERIN 5 MG/ML IV SOLN
INTRAVENOUS | Status: AC
  Filled 2018-06-25: qty 10

## 2018-06-25 MED ORDER — SODIUM CHLORIDE 0.9 % IV SOLN
INTRAVENOUS | Status: AC | PRN
  Administered 2018-06-25: 500 mL via INTRAVENOUS

## 2018-06-25 MED ORDER — DIPHENHYDRAMINE HCL 50 MG/ML IJ SOLN
INTRAMUSCULAR | Status: DC | PRN
  Administered 2018-06-25: 50 mg via INTRAVENOUS

## 2018-06-25 MED ORDER — IOPAMIDOL (ISOVUE-300) INJECTION 61%
INTRAVENOUS | Status: DC | PRN
  Administered 2018-06-25: 60 mL via INTRA_ARTERIAL

## 2018-06-25 MED ORDER — FLEET ENEMA 7-19 GM/118ML RE ENEM: 1.0000 | ENEMA | Freq: Once | RECTAL | Status: DC | PRN

## 2018-06-25 MED ORDER — CEFAZOLIN SODIUM-DEXTROSE 2-4 GM/100ML-% IV SOLN
INTRAVENOUS | Status: AC
  Administered 2018-06-25: 11:00:00
  Filled 2018-06-25: qty 100

## 2018-06-25 MED ORDER — ALTEPLASE 2 MG IJ SOLR
INTRAMUSCULAR | Status: AC
  Filled 2018-06-25: qty 8

## 2018-06-25 MED ORDER — ONDANSETRON HCL 4 MG PO TABS: 4.0000 mg | ORAL_TABLET | Freq: Four times a day (QID) | ORAL | Status: DC | PRN

## 2018-06-25 MED ORDER — DOCUSATE SODIUM 100 MG PO CAPS
100.0000 mg | ORAL_CAPSULE | Freq: Two times a day (BID) | ORAL | Status: DC
  Administered 2018-06-25 – 2018-06-26 (×2): 100 mg via ORAL
  Filled 2018-06-25 (×2): qty 1

## 2018-06-25 MED ORDER — MIDAZOLAM HCL 5 MG/5ML IJ SOLN
INTRAMUSCULAR | Status: AC
  Filled 2018-06-25: qty 5

## 2018-06-25 MED ORDER — LISINOPRIL 20 MG PO TABS
20.0000 mg | ORAL_TABLET | Freq: Every day | ORAL | Status: DC
  Administered 2018-06-26: 20 mg via ORAL
  Filled 2018-06-25: qty 1

## 2018-06-25 MED ORDER — VITAMIN B-12 1000 MCG PO TABS
1000.0000 ug | ORAL_TABLET | Freq: Every day | ORAL | Status: DC
  Administered 2018-06-26: 1000 ug via ORAL
  Filled 2018-06-25: qty 1

## 2018-06-25 MED ORDER — SODIUM CHLORIDE FLUSH 0.9 % IV SOLN
INTRAVENOUS | Status: AC
  Filled 2018-06-25: qty 20

## 2018-06-25 MED ORDER — HEPARIN SODIUM (PORCINE) 1000 UNIT/ML IJ SOLN
INTRAMUSCULAR | Status: AC
  Filled 2018-06-25: qty 1

## 2018-06-25 MED ORDER — SODIUM CHLORIDE 0.9 % IV BOLUS: 250.0000 mL | Freq: Once | INTRAVENOUS | Status: DC

## 2018-06-25 SURGICAL SUPPLY — 27 items
BALLN LUTONIX 018 4X150X130 (BALLOONS) ×2
BALLN LUTONIX 018 5X100X130 (BALLOONS) ×2
BALLN LUTONIX 5X220X130 (BALLOONS) ×2
BALLN ULTRVRSE 3X300X150 (BALLOONS) ×1
BALLN ULTRVRSE 3X300X150 OTW (BALLOONS) ×1
BALLOON LUTONIX 018 4X150X130 (BALLOONS) IMPLANT
BALLOON LUTONIX 018 5X100X130 (BALLOONS) IMPLANT
BALLOON LUTONIX 5X220X130 (BALLOONS) IMPLANT
BALLOON ULTRVRSE 3X300X150 OTW (BALLOONS) IMPLANT
CANISTER PENUMBRA ENGINE (MISCELLANEOUS) ×1 IMPLANT
CATH BEACON 5 .038 100 VERT TP (CATHETERS) ×1 IMPLANT
CATH INDIGO CAT6 KIT (CATHETERS) ×1 IMPLANT
CATH INFUS 90CMX50CM (CATHETERS) ×1
CATH INFUS UNIFUSE 90X50 5FR (CATHETERS) IMPLANT
CATH PIG 70CM (CATHETERS) ×1 IMPLANT
DEVICE PRESTO INFLATION (MISCELLANEOUS) ×1 IMPLANT
DEVICE SAFEGUARD 24CM (GAUZE/BANDAGES/DRESSINGS) ×1 IMPLANT
DEVICE STARCLOSE SE CLOSURE (Vascular Products) ×1 IMPLANT
GLIDEWIRE ADV .035X260CM (WIRE) ×1 IMPLANT
GOWN STRL XL  DISP (MISCELLANEOUS) ×2 IMPLANT
PACK ANGIOGRAPHY (CUSTOM PROCEDURE TRAY) ×2 IMPLANT
SHEATH BRITE TIP 5FRX11 (SHEATH) ×1 IMPLANT
SHEATH PINNACLE ST 6F 45CM (SHEATH) ×1 IMPLANT
SYR MEDRAD MARK V 150ML (SYRINGE) ×1 IMPLANT
TUBING CONTRAST HIGH PRESS 72 (TUBING) ×1 IMPLANT
WIRE G V18X300CM (WIRE) ×1 IMPLANT
WIRE J 3MM .035X145CM (WIRE) ×1 IMPLANT

## 2018-06-25 NOTE — Progress Notes (Signed)
MD notified to clarify some orders. MD acknowledged. I will continue to assess.

## 2018-06-25 NOTE — H&P (Signed)
[facility] History & Physical Update  The patient was interviewed and re-examined.  The patient's previous History and Physical has been reviewed and is unchanged.  There is no change in the plan of care. We plan to proceed with the scheduled procedure.  Leotis Pain, MD  06/25/2018, 10:19 AM

## 2018-06-25 NOTE — Op Note (Signed)
[facility]  Percutaneous Study/Intervention Procedural Note   Date of Surgery: 06/25/2018  Surgeon(s):Darielys Giglia    Assistants:none  Pre-operative Diagnosis: PAD with rest pain right foot, acute on chronic ischemia after previous intervention  Post-operative diagnosis:  Same  Procedure(s) Performed:             1.  Ultrasound guidance for vascular access left femoral artery             2.  Catheter placement into right SFA from left femoral approach             3.  Aortogram and selective right lower extremity angiogram             4.   Catheter directed thrombolytic therapy with 8 mg of TPA infused through a thrombolytic catheter in the right SFA and popliteal arteries             5.   Mechanical thrombectomy using the penumbra cat 6 device in the right SFA, popliteal artery, tibioperoneal trunk, and peroneal artery  6.  Percutaneous transluminal angioplasty of the right tibioperoneal trunk and distal popliteal artery with 4 mm diameter by 15 cm length Lutonix drug-coated angioplasty balloon  7.  Percutaneous transluminal angioplasty of the right popliteal artery and SFA with a 5 mm diameter by 22 cm length Lutonix drug-coated angioplasty balloon  8.  Percutaneous transluminal angioplasty of the right anterior tibial artery with 3 mm diameter by 30 cm length angioplasty balloon             9.  StarClose closure device left femoral artery  EBL: 100 cc  Contrast: 60 cc  Fluoro Time: 6.0 minutes  Moderate Conscious Sedation Time: approximately 65 minutes using 5 mg of Versed and 200 Mcg of Fentanyl              Indications:  Patient is a 63 y.o.male with rest pain of the right foot after a previous intervention on the right leg. The patient has noninvasive study showing occlusion of the recent intervention. The patient is brought in for angiography for further evaluation and potential treatment.  Due to the limb threatening nature of the situation, angiogram was  performed for attempted limb salvage. The patient is aware that if the procedure fails, amputation would be expected.  The patient also understands that even with successful revascularization, amputation may still be required due to the severity of the situation. Risks and benefits are discussed and informed consent is obtained.   Procedure:  The patient was identified and appropriate procedural time out was performed.  The patient was then placed supine on the table and prepped and draped in the usual sterile fashion. Moderate conscious sedation was administered during a face to face encounter with the patient throughout the procedure with my supervision of the RN administering medicines and monitoring the patient's vital signs, pulse oximetry, telemetry and mental status throughout from the start of the procedure until the patient was taken to the recovery room. Ultrasound was used to evaluate the left common femoral artery.  It was patent .  A digital ultrasound image was acquired.  A Seldinger needle was used to access the left common femoral artery under direct ultrasound guidance and a permanent image was performed.  A 0.035 J wire was advanced without resistance and a 5Fr sheath was placed.  Pigtail catheter was placed into the aorta and an AP aortogram was performed. This demonstrated normal aorta and iliac segments without significant  stenosis.  There may have been some mild right renal artery stenosis but this was not well opacified due to patient motion.  I then crossed the aortic bifurcation and advanced to the right femoral head.  The pigtail catheter was then advanced into the right proximal superficial femoral artery to help opacify distally. Selective right lower extremity angiogram was then performed. This demonstrated occlusion of the SFA just above the previously placed stent with a clear rim suggesting thrombosis.  There was some faint reconstitution of the anterior tibial and posterior tibial  arteries distally. . It was felt that it was in the patient's best interest to proceed with intervention after these images to avoid a second procedure and a larger amount of contrast and fluoroscopy based off of the findings from the initial angiogram. The patient was systemically heparinized and a 6 [place] destination sheath was then placed over the Terumo Advantage wire. I then used a Kumpe catheter and the advantage wire to navigate through the occlusion.  The diagnostic catheter was then removed and then an infusion thrombolytic catheter was placed.  8 mg of TPA were instilled in the right SFA and popliteal arteries through the 90 cm total length 50 cm working length thrombolytic catheter.  Infusion of TPA was allowed to dwell for approximately 20 minutes.  I then proceeded with mechanical thrombectomy using the penumbra cat 6 device over a 0.018 wire.  Multiple passes were made in the right SFA, popliteal artery, and track down initially into the tibioperoneal trunk and peroneal artery.  Multiple chunks of thrombus were removed with resultant high-grade residual stenosis in the mid SFA just above the previously placed stent and thrombus and stenosis in the distal portion of the stent down into the tibioperoneal trunk.  A 4 mm diameter by 15 cm length Lutonix drug-coated angioplasty balloon was inflated to 10 atm in the tibioperoneal trunk and distal popliteal artery.  A 5 mm diameter by 22 cm length Lutonix drug-coated angioplasty balloon were then used to treat the above-knee popliteal artery and mid and distal SFA with an inflation to 8 atm for 1 minute.  There remained poor flow distally and another pass with the penumbra cat 6 device down into the tibioperoneal trunk and peroneal artery were performed.  I then navigated down into the anterior tibial artery and selective imaging was performed to confirm that we were in the anterior tibial artery.  A 3 mm diameter by 30 cm length angioplasty balloon was  then used to treat the anterior tibial artery from the mid to distal segment up across the origin of the anterior tibial artery.  This was inflated to 10 atm for 1 minute.  He had a significant amount of spasm distally and intra-arterial nitroglycerin were given, but somewhat sluggish distal flow remained.  There was only about a 20% residual stenosis within the SFA or popliteal arteries after angioplasty and thrombectomy.  There was only about a 20% residual stenosis in the anterior tibial artery is the tibioperoneal trunk and peroneal artery remained occluded distally, but at this point we had regained inline flow distally.  The patient had continuous motion making her imaging very poor, and I felt that further attempts at trying to open other tibial vessels were likely to be unsuccessful at this point.  I will admit the patient for overnight Aggrastat therapy but no further endovascular therapy is likely to be of benefit. I elected to terminate the procedure. The sheath was removed and StarClose closure device was  deployed in the left femoral artery with excellent hemostatic result. The patient was taken to the recovery room in stable condition having tolerated the procedure well.  Findings:               Aortogram:  Aorta and iliac arteries without significant stenosis.  The renal arteries appeared to have decent flow although there was possibly some right renal artery stenosis.  Patient motion made this picture difficult to interpret, and further imaging was not necessary at this time as we were addressing the ischemic leg.             Right lower Extremity:  Occlusion of the SFA just above the previously placed stent with a clear rim suggesting thrombosis.  There was some faint reconstitution of the anterior tibial and posterior tibial arteries distally.   Disposition: Patient was taken to the recovery room in stable condition having tolerated the procedure well.  Complications: None  Leotis Pain 06/25/2018 1:06 PM   This note was created with Dragon Medical transcription system. Any errors in dictation are purely unintentional.

## 2018-06-25 NOTE — Progress Notes (Signed)
Patient clinically stable post lower extremity angiogram, family at bedside. Dr Lucky Cowboy out to speak with patient with questions answered. Giving bolus ns for sbp as per orders. No bleeding nor hematoma at left groin site. PAD device with 40 ml air since pt to be on Aggrastat gtt iv overnight.

## 2018-06-25 NOTE — Progress Notes (Signed)
Subjective:    Patient ID: Ryan [name], male    DOB: 1955-11-29, 63 y.o.   MRN: [id number] Chief Complaint  Patient presents with  . Follow-up    HPI  Ryan [name] is a 63 y.o. male that presents today with concerns for numbness of his right lower extremity.  The patient previously had a right lower extremity angiogram on 05/07/2018 which resulted in stent placement to the right SFA/popliteal artery.  Follow-up studies revealed normal ABIs.  However he stated that approximately on Christmas day he began to have pain and numbness which was consistent with how he felt previously.  He denies any new wounds or ulcer formation.  He denies any rest pain like symptoms.  He does endorse claudication when ambulation especially up inclines.  He denies any chest pain or shortness of breath.  He denies any fever, chills, nausea, vomiting or diarrhea.  He underwent bilateral ABIs today which revealed his ABI on his right at 0.32 with a TBI of 0.13.  His left had an ABI of 1.12 with a TBI 0.75.  Previous studies done on 06/01/2018 reveal an ABI of 1.14 on his right lower extremity with 1.17 on his left.  He has monophasic waveforms in his right tibial arteries with triphasic waveforms in his left posterior tibial artery and biphasic in his peroneal.  There are low monophasic waveforms in his left anterior tibial artery.  His toe waveforms are normal on his left lower extremity whereas on his right his great toe has a weakened waveform and there is no flow detected in any of his other digits on his right lower extremity.  Past Medical History:  Diagnosis Date  . Hypertension   . Neuromuscular disorder (HCC)    numbness feet  . Peripheral vascular disease ([place])   . Shortness of breath dyspnea     Past Surgical History:  Procedure Laterality Date  . COLONOSCOPY WITH PROPOFOL N/A 02/15/2016   Procedure: COLONOSCOPY WITH PROPOFOL;  Surgeon: Lucilla Lame, MD;  Location: [facility];   Service: Endoscopy;  Laterality: N/A;  . HERNIA REPAIR  1999   left inguinal  . LOWER EXTREMITY ANGIOGRAPHY Right 05/07/2018   Procedure: LOWER EXTREMITY ANGIOGRAPHY;  Surgeon: Algernon Huxley, MD;  Location: [facility];  Service: Cardiovascular;  Laterality: Right;  . POLYPECTOMY N/A 02/15/2016   Procedure: POLYPECTOMY;  Surgeon: Lucilla Lame, MD;  Location: [facility];  Service: Endoscopy;  Laterality: N/A;    Social History   Socioeconomic History  . Marital status: Legally Separated    Spouse name: [name]  . Number of children: 5  . Years of education: Not on file  . Highest education level: High school graduate  Occupational History  . Occupation: [employer]    Comment: [name]  Social Needs  . Financial resource strain: Not hard at all  . Food insecurity:    Worry: Never true    Inability: Never true  . Transportation needs:    Medical: No    Non-medical: No  Tobacco Use  . Smoking status: Current Every Day Smoker    Packs/day: 1.12    Years: 50.00    Pack years: 56.00    Types: Cigarettes  . Smokeless tobacco: Never Used  . Tobacco comment: trying to cut down   Substance and Sexual Activity  . Alcohol use: Yes    Alcohol/week: 9.0 standard drinks    Types: 9 Cans of beer per week  . Drug use:  Never  . Sexual activity: Yes    Partners: Female    Birth control/protection: None  Lifestyle  . Physical activity:    Days per week: 6 days    Minutes per session: 60 min  . Stress: Not at all  Relationships  . Social connections:    Talks on phone: Three times a week    Gets together: Twice a week    Attends religious service: Never    Active member of club or organization: No    Attends meetings of clubs or organizations: Never    Relationship status: Married  . Intimate partner violence:    Fear of current or ex partner: No    Emotionally abused: No    Physically abused: No    Forced sexual activity: No  Other Topics Concern  . Not on  file  Social History Narrative  . Not on file    Family History  Problem Relation Age of Onset  . COPD Mother   . Hypertension Father   . Heart attack Brother     No Known Allergies   Review of Systems   Review of Systems: Negative Unless Checked Constitutional: [] Weight loss  [] Fever  [] Chills Cardiac: [] Chest pain   []  Atrial Fibrillation  [] Palpitations   [] Shortness of breath when laying flat   [] Shortness of breath with exertion. [] Shortness of breath at rest Vascular:  [x] Pain in legs with walking   [] Pain in legs with standing [] Pain in legs when laying flat   [x] Claudication    [] Pain in feet when laying flat    [] History of DVT   [] Phlebitis   [] Swelling in legs   [x] Varicose veins   [] Non-healing ulcers Pulmonary:   [] Uses home oxygen   [] Productive cough   [] Hemoptysis   [] Wheeze  [] COPD   [] Asthma Neurologic:  [] Dizziness   [] Seizures  [] Blackouts [] History of stroke   [] History of TIA  [] Aphasia   [] Temporary Blindness   [] Weakness or numbness in arm   [x] Weakness or numbness in leg Musculoskeletal:   [] Joint swelling   [] Joint pain   [] Low back pain  []  History of Knee Replacement [] Arthritis [] back Surgeries  []  Spinal Stenosis    Hematologic:  [] Easy bruising  [] Easy bleeding   [] Hypercoagulable state   [] Anemic Gastrointestinal:  [] Diarrhea   [] Vomiting  [] Gastroesophageal reflux/heartburn   [] Difficulty swallowing. [] Abdominal pain Genitourinary:  [] Chronic kidney disease   [] Difficult urination  [] Anuric   [] Blood in urine [] Frequent urination  [] Burning with urination   [] Hematuria Skin:  [] Rashes   [] Ulcers [] Wounds Psychological:  [] History of anxiety   []  History of major depression  []  Memory Difficulties     Objective:   Physical Exam  BP (!) 196/114 (BP Location: Right Arm, Patient Position: Sitting)   Pulse 83   Resp 12   Ht 5\' 8"  (1.727 m)   Wt 125 lb 6.4 oz (56.9 kg)   BMI 19.07 kg/m   Gen: WD/WN, NAD Head: [place]/AT, No temporalis wasting.    Ear/Nose/Throat: Hearing grossly intact, nares w/o erythema or drainage Eyes: PER, EOMI, sclera nonicteric.  Neck: Supple, no masses.  No JVD.  Pulmonary:  Good air movement, no use of accessory muscles.  Cardiac: RRR Vascular: prominent varicose veins bilaterally Vessel Right Left  Radial Palpable Palpable  Dorsalis Pedis Not Palpable Trace Palpable  Posterior Tibial Not Palpable Palpable   Gastrointestinal: soft, non-distended. No guarding/no peritoneal signs.  Musculoskeletal: M/S 5/5 throughout.  No deformity or atrophy.  Neurologic:  Pain and light touch intact in extremities.  Symmetrical.  Speech is fluent. Motor exam as listed above. Psychiatric: Judgment intact, Mood & affect appropriate for pt's clinical situation. Dermatologic: No Venous rashes. No Ulcers Noted.  No changes consistent with cellulitis. Lymph : No Cervical lymphadenopathy, no lichenification or skin changes of chronic lymphedema.      Assessment & Plan:   1. Claudication [facility]) He underwent bilateral ABIs today which revealed his ABI on his right at 0.32 with a TBI of 0.13.  His left had an ABI of 1.12 with a TBI 0.75.  Previous studies done on 06/01/2018 reveal an ABI of 1.14 on his right lower extremity with 1.17 on his left.  He has monophasic waveforms in his right tibial arteries with triphasic waveforms in his left posterior tibial artery and biphasic in his peroneal.  There are low monophasic waveforms in his left anterior tibial artery.  His toe waveforms are normal on his left lower extremity whereas on his right his great toe has a weakened waveform and there is no flow detected in any of his other digits on his right lower extremity.  Recommend:  The patient has experienced increased symptoms and is now describing lifestyle limiting claudication and mild rest pain.   Given the severity of the patient's right lower extremity symptoms the patient should undergo angiography and intervention.  Risk and  benefits were reviewed the patient.  Indications for the procedure were reviewed.  All questions were answered, the patient agrees to proceed.   The patient should continue walking and begin a more formal exercise program.  The patient should continue antiplatelet therapy and aggressive treatment of the lipid abnormalities  The patient will follow up with me after the angiogram.  2. Hypertension, benign Continue antihypertensive medications as already ordered, these medications have been reviewed and there are no changes at this time.   3. Varicose veins of leg with pain, right Recommend:  The patient is complaining of varicose veins.    I have had a long discussion with the patient regarding  varicose veins and why they cause symptoms.  Patient will begin wearing graduated compression stockings on a daily basis, beginning first thing in the morning and removing them in the evening. The patient is instructed specifically not to sleep in the stockings.    The patient  will also begin using over-the-counter analgesics such as Motrin 600 mg po TID to help control the symptoms as needed.    In addition, behavioral modification including elevation during the day will be initiated, utilizing a recliner was recommended.  The patient is also instructed to continue exercising such as walking 4-5 times per week.  At this time the patient wishes to continue conservative therapy and is not interested in more invasive treatments such as laser ablation and sclerotherapy.  The Patient will follow up PRN if the symptoms worsen.   Current Outpatient Medications on File Prior to Visit  Medication Sig Dispense Refill  . aspirin EC 81 MG tablet Take 1 tablet (81 mg total) by mouth daily. 30 tablet 0  . atorvastatin (LIPITOR) 20 MG tablet Take 1 tablet (20 mg total) by mouth daily. 90 tablet 0  . chlorthalidone (HYGROTON) 25 MG tablet Take 1 tablet (25 mg total) by mouth daily. 30 tablet 1  . clopidogrel  (PLAVIX) 75 MG tablet Take 1 tablet (75 mg total) by mouth daily. 30 tablet 11  . Cyanocobalamin (VITAMIN B-12) 1000 MCG SUBL Place 1 tablet (1,000 mcg  total) under the tongue daily. 90 tablet 0  . lisinopril (PRINIVIL,ZESTRIL) 20 MG tablet Take 1 tablet (20 mg total) by mouth daily. 90 tablet 0  . nicotine polacrilex (NICORETTE) 2 MG gum Take 1 each (2 mg total) by mouth as needed for smoking cessation. 100 tablet 0  . umeclidinium-vilanterol (ANORO ELLIPTA) 62.5-25 MCG/INH AEPB Inhale 1 puff into the lungs daily. 180 each 0   No current facility-administered medications on file prior to visit.     There are no Patient Instructions on file for this visit. No follow-ups on file.   Kris Hartmann, NP  This note was completed with Sales executive.  Any errors are purely unintentional.

## 2018-06-25 NOTE — Discharge Instructions (Signed)
Moderate Conscious Sedation, Adult, Care After °These instructions provide you with information about caring for yourself after your procedure. Your health care provider may also give you more specific instructions. Your treatment has been planned according to current medical practices, but problems sometimes occur. Call your health care provider if you have any problems or questions after your procedure. °What can I expect after the procedure? °After your procedure, it is common: °· To feel sleepy for several hours. °· To feel clumsy and have poor balance for several hours. °· To have poor judgment for several hours. °· To vomit if you eat too soon. °Follow these instructions at home: °For at least 24 hours after the procedure: ° °· Do not: °? Participate in activities where you could fall or become injured. °? Drive. °? Use heavy machinery. °? Drink alcohol. °? Take sleeping pills or medicines that cause drowsiness. °? Make important decisions or sign legal documents. °? Take care of children on your own. °· Rest. °Eating and drinking °· Follow the diet recommended by your health care provider. °· If you vomit: °? Drink water, juice, or soup when you can drink without vomiting. °? Make sure you have little or no nausea before eating solid foods. °General instructions °· Have a responsible adult stay with you until you are awake and alert. °· Take over-the-counter and prescription medicines only as told by your health care provider. °· If you smoke, do not smoke without supervision. °· Keep all follow-up visits as told by your health care provider. This is important. °Contact a health care provider if: °· You keep feeling nauseous or you keep vomiting. °· You feel light-headed. °· You develop a rash. °· You have a fever. °Get help right away if: °· You have trouble breathing. °This information is not intended to replace advice given to you by your health care provider. Make sure you discuss any questions you have  with your health care provider. °Document Released: 03/31/2013 Document Revised: 11/13/2015 Document Reviewed: 09/30/2015 °Elsevier Interactive Patient Education © 2019 [employer] °Femoral Site Care °This sheet gives you information about how to care for yourself after your procedure. Your health care provider may also give you more specific instructions. If you have problems or questions, contact your health care provider. °What can I expect after the procedure? °After the procedure, it is common to have: °· Bruising that usually fades within 1-2 weeks. °· Tenderness at the site. °Follow these instructions at home: °Wound care °· Follow instructions from your health care provider about how to take care of your insertion site. Make sure you: °? Wash your hands with soap and water before you change your bandage (dressing). If soap and water are not available, use hand sanitizer. °? Change your dressing as told by your health care provider. °? Leave stitches (sutures), skin glue, or adhesive strips in place. These skin closures may need to stay in place for 2 weeks or longer. If adhesive strip edges start to loosen and curl up, you may trim the loose edges. Do not remove adhesive strips completely unless your health care provider tells you to do that. °· Do not take baths, swim, or use a hot tub until your health care provider approves. °· You may shower 24-48 hours after the procedure or as told by your health care provider. °? Gently wash the site with plain soap and water. °? Pat the area dry with a clean towel. °? Do not rub the site. This may cause   bleeding. °· Do not apply powder or lotion to the site. Keep the site clean and dry. °· Check your femoral site every day for signs of infection. Check for: °? Redness, swelling, or pain. °? Fluid or blood. °? Warmth. °? Pus or a bad smell. °Activity °· For the first 2-3 days after your procedure, or as long as directed: °? Avoid climbing stairs as much as  possible. °? Do not squat. °· Do not lift anything that is heavier than 10 lb (4.5 kg), or the limit that you are told, until your health care provider says that it is safe. °· Rest as directed. °? Avoid sitting for a long time without moving. Get up to take short walks every 1-2 hours. °· Do not drive for 24 hours if you were given a medicine to help you relax (sedative). °General instructions °· Take over-the-counter and prescription medicines only as told by your health care provider. °· Keep all follow-up visits as told by your health care provider. This is important. °Contact a health care provider if you have: °· A fever or chills. °· You have redness, swelling, or pain around your insertion site. °Get help right away if: °· The catheter insertion area swells very fast. °· You pass out. °· You suddenly start to sweat or your skin gets clammy. °· The catheter insertion area is bleeding, and the bleeding does not stop when you hold steady pressure on the area. °· The area near or just beyond the catheter insertion site becomes pale, cool, tingly, or numb. °These symptoms may represent a serious problem that is an emergency. Do not wait to see if the symptoms will go away. Get medical help right away. Call your local emergency services (911 in the [place] Do not drive yourself to the hospital. °Summary °· After the procedure, it is common to have bruising that usually fades within 1-2 weeks. °· Check your femoral site every day for signs of infection. °· Do not lift anything that is heavier than 10 lb (4.5 kg), or the limit that you are told, until your health care provider says that it is safe. °This information is not intended to replace advice given to you by your health care provider. Make sure you discuss any questions you have with your health care provider. °Document Released: 02/11/2014 Document Revised: 06/23/2017 Document Reviewed: 06/23/2017 °Elsevier Interactive Patient Education © 2019 Elsevier  Inc[employer] ° °

## 2018-06-25 NOTE — Progress Notes (Signed)
Patient resting with family at bedside.denies complaints,dozing at intervals. Waiting for bed placement. Sinus per monitor,vitals stable.no bleeding nor hematoma at left groin site.

## 2018-06-26 ENCOUNTER — Encounter (INDEPENDENT_AMBULATORY_CARE_PROVIDER_SITE_OTHER): Payer: Self-pay | Admitting: Vascular Surgery

## 2018-06-26 ENCOUNTER — Encounter: Payer: Self-pay | Admitting: Vascular Surgery

## 2018-06-26 ENCOUNTER — Telehealth (INDEPENDENT_AMBULATORY_CARE_PROVIDER_SITE_OTHER): Payer: Self-pay

## 2018-06-26 DIAGNOSIS — Z7982 Long term (current) use of aspirin: Secondary | ICD-10-CM | POA: Diagnosis not present

## 2018-06-26 DIAGNOSIS — F17210 Nicotine dependence, cigarettes, uncomplicated: Secondary | ICD-10-CM | POA: Diagnosis not present

## 2018-06-26 DIAGNOSIS — Z955 Presence of coronary angioplasty implant and graft: Secondary | ICD-10-CM | POA: Diagnosis not present

## 2018-06-26 DIAGNOSIS — Z79899 Other long term (current) drug therapy: Secondary | ICD-10-CM | POA: Diagnosis not present

## 2018-06-26 DIAGNOSIS — I10 Essential (primary) hypertension: Secondary | ICD-10-CM | POA: Diagnosis not present

## 2018-06-26 DIAGNOSIS — Z7902 Long term (current) use of antithrombotics/antiplatelets: Secondary | ICD-10-CM | POA: Diagnosis not present

## 2018-06-26 DIAGNOSIS — G709 Myoneural disorder, unspecified: Secondary | ICD-10-CM | POA: Diagnosis not present

## 2018-06-26 DIAGNOSIS — I83811 Varicose veins of right lower extremities with pain: Secondary | ICD-10-CM | POA: Diagnosis not present

## 2018-06-26 DIAGNOSIS — Z8249 Family history of ischemic heart disease and other diseases of the circulatory system: Secondary | ICD-10-CM | POA: Diagnosis not present

## 2018-06-26 DIAGNOSIS — I70221 Atherosclerosis of native arteries of extremities with rest pain, right leg: Secondary | ICD-10-CM | POA: Diagnosis not present

## 2018-06-26 LAB — CBC
HCT: 31.3 % — ABNORMAL LOW (ref 39.0–52.0)
Hemoglobin: 10.5 g/dL — ABNORMAL LOW (ref 13.0–17.0)
MCH: 31.7 pg (ref 26.0–34.0)
MCHC: 33.5 g/dL (ref 30.0–36.0)
MCV: 94.6 fL (ref 80.0–100.0)
Platelets: 258 10*3/uL (ref 150–400)
RBC: 3.31 MIL/uL — ABNORMAL LOW (ref 4.22–5.81)
RDW: 12 % (ref 11.5–15.5)
WBC: 5.8 10*3/uL (ref 4.0–10.5)
nRBC: 0 % (ref 0.0–0.2)

## 2018-06-26 LAB — BASIC METABOLIC PANEL
Anion gap: 7 (ref 5–15)
BUN: 23 mg/dL (ref 8–23)
CO2: 21 mmol/L — ABNORMAL LOW (ref 22–32)
Calcium: 8.2 mg/dL — ABNORMAL LOW (ref 8.9–10.3)
Chloride: 109 mmol/L (ref 98–111)
Creatinine, Ser: 1.3 mg/dL — ABNORMAL HIGH (ref 0.61–1.24)
GFR calc Af Amer: >60 mL/min (ref >60)
GFR, EST NON AFRICAN AMERICAN: 59 mL/min — AB (ref >60)
Glucose, Bld: 77 mg/dL (ref 70–99)
Potassium: 4.2 mmol/L (ref 3.5–5.1)
Sodium: 137 mmol/L (ref 135–145)

## 2018-06-26 MED ORDER — APIXABAN 5 MG PO TABS
5.0000 mg | ORAL_TABLET | Freq: Two times a day (BID) | ORAL | Status: DC
  Administered 2018-06-26: 5 mg via ORAL
  Filled 2018-06-26: qty 1

## 2018-06-26 MED ORDER — HYDROCODONE-ACETAMINOPHEN 5-325 MG PO TABS: 1.0000 | ORAL_TABLET | ORAL | 0 refills | Status: DC | PRN

## 2018-06-26 MED ORDER — APIXABAN 5 MG PO TABS: 5.0000 mg | ORAL_TABLET | Freq: Two times a day (BID) | ORAL | 2 refills | Status: DC

## 2018-06-26 MED ORDER — NICOTINE 14 MG/24HR TD PT24: 14.0000 mg | MEDICATED_PATCH | Freq: Every day | TRANSDERMAL | 0 refills | Status: DC

## 2018-06-26 NOTE — Telephone Encounter (Signed)
Please advise on message below.

## 2018-06-26 NOTE — Care Management Note (Signed)
Case Management Note  Patient Details  Name: Ryan [name] MRN: [id number] Date of Birth: [birth year]  Subjective/Objective:  Patient is discharging to home today on Eliquis.  30 day free coupon and co-pay assist coupon given.  Independent in all adls, denies issues accessing medical care, obtaining medications or with transportation.  Current with PCP.  No discharge needs identified at present by care manager or members of care team                   Action/Plan:   Expected Discharge Date:  06/26/18               Expected Discharge Plan:  Home/Self Care  In-House Referral:     Discharge planning Services  CM Consult  Post Acute Care Choice:    Choice offered to:     DME Arranged:    DME Agency:     HH Arranged:    [place] Agency:     Status of Service:  Completed, signed off  If discussed at [employer] of Stay Meetings, dates discussed:    Additional Comments:  Elza Rafter, RN 06/26/2018, 11:37 AM

## 2018-06-26 NOTE — Telephone Encounter (Signed)
-----   Message from Carlena Bjornstad sent at 06/26/2018  1:48 PM EST ----- Regarding: work note Patients daughter said the he may need a work note. Renda Rolls can be reached at [phone] for any questions. She also dropped off fmla papers and would like them faxed if possible and said her dad will come in a pay the $25 fee.

## 2018-06-26 NOTE — Telephone Encounter (Signed)
Called patients daughter and the work note was faxed earlier today.

## 2018-06-26 NOTE — Telephone Encounter (Signed)
Ok I will get them filled out and we will give them a call when they are ready.  Let us know if he needs a work note and we can have one waiting at the front desk

## 2018-06-27 LAB — HIV ANTIBODY (ROUTINE TESTING W REFLEX): HIV SCREEN 4TH GENERATION: NONREACTIVE

## 2018-06-29 ENCOUNTER — Other Ambulatory Visit (INDEPENDENT_AMBULATORY_CARE_PROVIDER_SITE_OTHER): Payer: Self-pay | Admitting: Nurse Practitioner

## 2018-06-29 ENCOUNTER — Telehealth (INDEPENDENT_AMBULATORY_CARE_PROVIDER_SITE_OTHER): Payer: Self-pay

## 2018-06-29 ENCOUNTER — Telehealth (INDEPENDENT_AMBULATORY_CARE_PROVIDER_SITE_OTHER): Payer: Self-pay | Admitting: Nurse Practitioner

## 2018-06-29 MED ORDER — NYSTATIN-TRIAMCINOLONE 100000-0.1 UNIT/GM-% EX OINT: 1.0000 "application " | TOPICAL_OINTMENT | Freq: Two times a day (BID) | CUTANEOUS | 0 refills | Status: DC

## 2018-06-29 NOTE — Telephone Encounter (Signed)
Ryan [name],   I spoke with pt and according to note from Dr. Lucky Cowboy he states will return on the 20th

## 2018-06-29 NOTE — Telephone Encounter (Signed)
The knot is normal because of what they use to close the skin after the procedure. Sometimes people have a little allergic reaction.  I sent a prescription for a cream to use on that area twice a day.  The veins may not change in appearance after the surgery, but if he swelling (which is common after this procedure) it can be uncomfortable.  He should try resting and elevating his leg when possible and taking some OTC ibuprofen for discomfort.  If this does not help in a few days he should let us know.  Also, could you please ask him when he will be returning to work, as I'm working on his paperwork. Thanks.

## 2018-06-29 NOTE — Telephone Encounter (Signed)
-----   Message from Kris Hartmann, NP sent at 06/29/2018 12:08 PM EST ----- Could you contact Mr. Kron and find out what questions he has so that we can get him an answer. Thanks!

## 2018-06-29 NOTE — Telephone Encounter (Signed)
Patient stated that his incision from his surgery has knot and what looks like a rash around it. Warm to the touch. Slightly red. Itching. Also wanted to inform you that his veins look the same and his pain is the same in his shin. Please advise on what patient should do.

## 2018-06-30 ENCOUNTER — Telehealth (INDEPENDENT_AMBULATORY_CARE_PROVIDER_SITE_OTHER): Payer: Self-pay | Admitting: Nurse Practitioner

## 2018-06-30 NOTE — Telephone Encounter (Signed)
Call patient to make appointment per [facility] with ABI and lower extremity duplex for 07/01/2018

## 2018-07-01 ENCOUNTER — Encounter
Admission: AD | Disposition: A | Discharge status: Home / Self Care | Payer: Self-pay | Source: Ambulatory Visit | Attending: Vascular Surgery

## 2018-07-01 ENCOUNTER — Inpatient Hospital Stay
Admission: AD | Admit: 2018-07-01 | Discharge: 2018-07-03 | DRG: 272 | Disposition: A | Discharge status: Home / Self Care | Payer: BLUE CROSS/BLUE SHIELD | Source: Ambulatory Visit | Attending: Vascular Surgery | Admitting: Vascular Surgery

## 2018-07-01 ENCOUNTER — Encounter (INDEPENDENT_AMBULATORY_CARE_PROVIDER_SITE_OTHER): Payer: Self-pay

## 2018-07-01 ENCOUNTER — Other Ambulatory Visit: Payer: Self-pay

## 2018-07-01 ENCOUNTER — Encounter (INDEPENDENT_AMBULATORY_CARE_PROVIDER_SITE_OTHER): Payer: Self-pay | Admitting: Vascular Surgery

## 2018-07-01 ENCOUNTER — Other Ambulatory Visit (INDEPENDENT_AMBULATORY_CARE_PROVIDER_SITE_OTHER): Payer: Self-pay | Admitting: Vascular Surgery

## 2018-07-01 ENCOUNTER — Ambulatory Visit (INDEPENDENT_AMBULATORY_CARE_PROVIDER_SITE_OTHER): Payer: BLUE CROSS/BLUE SHIELD | Admitting: Nurse Practitioner

## 2018-07-01 ENCOUNTER — Ambulatory Visit (INDEPENDENT_AMBULATORY_CARE_PROVIDER_SITE_OTHER): Payer: BLUE CROSS/BLUE SHIELD

## 2018-07-01 VITALS — BP 139/84 | HR 64 | Resp 16 | Ht 68.0 in | Wt 124.8 lb

## 2018-07-01 DIAGNOSIS — I70229 Atherosclerosis of native arteries of extremities with rest pain, unspecified extremity: Secondary | ICD-10-CM

## 2018-07-01 DIAGNOSIS — I739 Peripheral vascular disease, unspecified: Secondary | ICD-10-CM

## 2018-07-01 DIAGNOSIS — Z7982 Long term (current) use of aspirin: Secondary | ICD-10-CM | POA: Diagnosis not present

## 2018-07-01 DIAGNOSIS — I700 Atherosclerosis of aorta: Secondary | ICD-10-CM | POA: Diagnosis present

## 2018-07-01 DIAGNOSIS — J438 Other emphysema: Secondary | ICD-10-CM

## 2018-07-01 DIAGNOSIS — I8391 Asymptomatic varicose veins of right lower extremity: Secondary | ICD-10-CM | POA: Diagnosis not present

## 2018-07-01 DIAGNOSIS — I70221 Atherosclerosis of native arteries of extremities with rest pain, right leg: Secondary | ICD-10-CM | POA: Diagnosis not present

## 2018-07-01 DIAGNOSIS — Z9862 Peripheral vascular angioplasty status: Secondary | ICD-10-CM | POA: Diagnosis not present

## 2018-07-01 DIAGNOSIS — E538 Deficiency of other specified B group vitamins: Secondary | ICD-10-CM | POA: Diagnosis not present

## 2018-07-01 DIAGNOSIS — Z79899 Other long term (current) drug therapy: Secondary | ICD-10-CM | POA: Diagnosis not present

## 2018-07-01 DIAGNOSIS — I10 Essential (primary) hypertension: Secondary | ICD-10-CM | POA: Diagnosis present

## 2018-07-01 DIAGNOSIS — F17210 Nicotine dependence, cigarettes, uncomplicated: Secondary | ICD-10-CM | POA: Diagnosis present

## 2018-07-01 DIAGNOSIS — I998 Other disorder of circulatory system: Secondary | ICD-10-CM | POA: Diagnosis present

## 2018-07-01 DIAGNOSIS — Z7902 Long term (current) use of antithrombotics/antiplatelets: Secondary | ICD-10-CM

## 2018-07-01 DIAGNOSIS — J439 Emphysema, unspecified: Secondary | ICD-10-CM | POA: Diagnosis not present

## 2018-07-01 HISTORY — PX: LOWER EXTREMITY ANGIOGRAPHY: CATH118251

## 2018-07-01 LAB — CBC
HCT: 31 % — ABNORMAL LOW (ref 39.0–52.0)
Hemoglobin: 10.2 g/dL — ABNORMAL LOW (ref 13.0–17.0)
MCH: 31.3 pg (ref 26.0–34.0)
MCHC: 32.9 g/dL (ref 30.0–36.0)
MCV: 95.1 fL (ref 80.0–100.0)
NRBC: 0 % (ref 0.0–0.2)
PLATELETS: 255 10*3/uL (ref 150–400)
RBC: 3.26 MIL/uL — ABNORMAL LOW (ref 4.22–5.81)
RDW: 11.7 % (ref 11.5–15.5)
WBC: 5.9 10*3/uL (ref 4.0–10.5)

## 2018-07-01 LAB — CREATININE, SERUM
Creatinine, Ser: 1.6 mg/dL — ABNORMAL HIGH (ref 0.61–1.24)
GFR calc Af Amer: 53 mL/min — ABNORMAL LOW (ref >60)
GFR calc non Af Amer: 46 mL/min — ABNORMAL LOW (ref >60)

## 2018-07-01 LAB — GLUCOSE, CAPILLARY: Glucose-Capillary: 118 mg/dL — ABNORMAL HIGH (ref 70–99)

## 2018-07-01 LAB — MRSA PCR SCREENING: MRSA by PCR: NEGATIVE

## 2018-07-01 LAB — FIBRINOGEN: Fibrinogen: 410 mg/dL (ref 210–475)

## 2018-07-01 LAB — BUN: BUN: 30 mg/dL — AB (ref 8–23)

## 2018-07-01 SURGERY — LOWER EXTREMITY ANGIOGRAPHY
Anesthesia: Moderate Sedation | Laterality: Right

## 2018-07-01 MED ORDER — HEPARIN (PORCINE) 25000 UT/250ML-% IV SOLN
INTRAVENOUS | Status: AC
  Administered 2018-07-01: 800 [IU]/h via INTRAVENOUS
  Filled 2018-07-01: qty 250

## 2018-07-01 MED ORDER — MIDAZOLAM HCL 2 MG/2ML IJ SOLN: 1.0000 mg | INTRAMUSCULAR | Status: DC | PRN

## 2018-07-01 MED ORDER — ONDANSETRON HCL 4 MG/2ML IJ SOLN: 4.0000 mg | Freq: Four times a day (QID) | INTRAMUSCULAR | Status: DC | PRN

## 2018-07-01 MED ORDER — MIDAZOLAM HCL 5 MG/5ML IJ SOLN
INTRAMUSCULAR | Status: AC
  Filled 2018-07-01: qty 5

## 2018-07-01 MED ORDER — SODIUM CHLORIDE 0.9 % IV SOLN
250.0000 mL | INTRAVENOUS | Status: DC | PRN
  Administered 2018-07-01: 250 mL via INTRAVENOUS

## 2018-07-01 MED ORDER — HYDROMORPHONE HCL 1 MG/ML IJ SOLN
1.0000 mg | Freq: Once | INTRAMUSCULAR | Status: AC | PRN
  Administered 2018-07-01: 1 mg via INTRAVENOUS
  Filled 2018-07-01: qty 1

## 2018-07-01 MED ORDER — ALTEPLASE 2 MG IJ SOLR
INTRAMUSCULAR | Status: AC
  Filled 2018-07-01: qty 8

## 2018-07-01 MED ORDER — MORPHINE SULFATE (PF) 2 MG/ML IV SOLN
INTRAVENOUS | Status: AC
  Filled 2018-07-01: qty 2

## 2018-07-01 MED ORDER — SODIUM CHLORIDE 0.9% FLUSH
3.0000 mL | Freq: Two times a day (BID) | INTRAVENOUS | Status: DC
  Administered 2018-07-01 – 2018-07-03 (×4): 3 mL via INTRAVENOUS

## 2018-07-01 MED ORDER — HEPARIN (PORCINE) IN NACL 1000-0.9 UT/500ML-% IV SOLN
INTRAVENOUS | Status: AC
  Filled 2018-07-01: qty 1000

## 2018-07-01 MED ORDER — HYDROMORPHONE HCL 1 MG/ML IJ SOLN
1.0000 mg | INTRAMUSCULAR | Status: DC | PRN
  Administered 2018-07-01 – 2018-07-02 (×3): 1 mg via INTRAVENOUS
  Filled 2018-07-01 (×3): qty 1

## 2018-07-01 MED ORDER — FENTANYL CITRATE (PF) 100 MCG/2ML IJ SOLN
INTRAMUSCULAR | Status: AC
  Filled 2018-07-01: qty 2

## 2018-07-01 MED ORDER — HEPARIN SODIUM (PORCINE) 1000 UNIT/ML IJ SOLN
INTRAMUSCULAR | Status: AC
  Filled 2018-07-01: qty 1

## 2018-07-01 MED ORDER — HEPARIN (PORCINE) 25000 UT/250ML-% IV SOLN: 800.0000 [IU]/h | INTRAVENOUS | Status: DC

## 2018-07-01 MED ORDER — HYDRALAZINE HCL 20 MG/ML IJ SOLN
10.0000 mg | INTRAMUSCULAR | Status: DC | PRN
  Administered 2018-07-01 – 2018-07-03 (×2): 10 mg via INTRAVENOUS
  Filled 2018-07-01 (×2): qty 1

## 2018-07-01 MED ORDER — CEFAZOLIN SODIUM-DEXTROSE 2-4 GM/100ML-% IV SOLN
INTRAVENOUS | Status: AC
  Filled 2018-07-01: qty 100

## 2018-07-01 MED ORDER — SODIUM CHLORIDE 0.9 % IV SOLN
1.0000 mg/h | INTRAVENOUS | Status: AC
  Administered 2018-07-01: 1 mg/h
  Filled 2018-07-01: qty 10

## 2018-07-01 MED ORDER — SODIUM CHLORIDE 0.9 % IV SOLN
INTRAVENOUS | Status: DC
  Administered 2018-07-01: 1000 mL via INTRAVENOUS

## 2018-07-01 MED ORDER — DEXTROSE 5 % IV SOLN
2.0000 g | Freq: Once | INTRAVENOUS | Status: AC
  Administered 2018-07-01: 2 g via INTRAVENOUS
  Filled 2018-07-01: qty 20

## 2018-07-01 MED ORDER — SODIUM CHLORIDE 0.9% FLUSH: 3.0000 mL | INTRAVENOUS | Status: DC | PRN

## 2018-07-01 MED ORDER — MIDAZOLAM HCL 2 MG/2ML IJ SOLN
INTRAMUSCULAR | Status: DC | PRN
  Administered 2018-07-01: 1 mg via INTRAVENOUS
  Administered 2018-07-01: 2 mg via INTRAVENOUS
  Administered 2018-07-01: 1 mg via INTRAVENOUS

## 2018-07-01 MED ORDER — HEPARIN (PORCINE) 25000 UT/250ML-% IV SOLN
800.0000 [IU]/h | INTRAVENOUS | Status: DC
  Administered 2018-07-01: 800 [IU]/h via INTRAVENOUS

## 2018-07-01 MED ORDER — FENTANYL CITRATE (PF) 100 MCG/2ML IJ SOLN
INTRAMUSCULAR | Status: DC | PRN
  Administered 2018-07-01 (×3): 50 ug via INTRAVENOUS

## 2018-07-01 MED ORDER — LIDOCAINE-EPINEPHRINE (PF) 1 %-1:200000 IJ SOLN
INTRAMUSCULAR | Status: AC
  Filled 2018-07-01: qty 20

## 2018-07-01 MED ORDER — SODIUM CHLORIDE 0.9 % IV SOLN
0.5000 mg/h | INTRAVENOUS | Status: DC
  Filled 2018-07-01 (×3): qty 10

## 2018-07-01 MED ORDER — HEPARIN SODIUM (PORCINE) 1000 UNIT/ML IJ SOLN
INTRAMUSCULAR | Status: DC | PRN
  Administered 2018-07-01: 3000 [IU] via INTRAVENOUS

## 2018-07-01 MED ORDER — MORPHINE SULFATE (PF) 4 MG/ML IV SOLN
5.0000 mg | INTRAVENOUS | Status: DC | PRN
  Administered 2018-07-01: 4 mg via INTRAVENOUS

## 2018-07-01 MED ORDER — ALTEPLASE 2 MG IJ SOLR
INTRAMUSCULAR | Status: DC | PRN
  Administered 2018-07-01: 8 mg

## 2018-07-01 SURGICAL SUPPLY — 10 items
CATH BEACON 5 .035 65 RIM TIP (CATHETERS) ×1 IMPLANT
CATH BEACON 5 .038 100 VERT TP (CATHETERS) ×1 IMPLANT
CATH INFUS 135CMX50CM (CATHETERS) ×1 IMPLANT
CATH PIG 70CM (CATHETERS) IMPLANT
KIT CATH CVC 3 LUMEN 7FR 8IN (MISCELLANEOUS) ×1 IMPLANT
PACK ANGIOGRAPHY (CUSTOM PROCEDURE TRAY) ×2 IMPLANT
SHEATH BRITE TIP 5FRX11 (SHEATH) ×1 IMPLANT
SHEATH DESTIN RDC 6FR 45 (SHEATH) ×1 IMPLANT
WIRE AQUATRACK .035X260CM (WIRE) ×1 IMPLANT
WIRE J 3MM .035X145CM (WIRE) ×1 IMPLANT

## 2018-07-01 NOTE — Progress Notes (Signed)
Patient post angiogram per Dr Lucky Cowboy, lysis catheter placed left groin for TPA infusion in ICU overnight, to come back in am for repeat procedure. Alert and oriented, daughter at bedside. Dr Lucky Cowboy out to speak with patient with questions answered. Report called to care nurse ICU 4 with questions answered, plan reviewed. To stay on TPA/Heparin infusion via lysis catheter overnight . No bleeding nor hematoma at left groin site.

## 2018-07-01 NOTE — Progress Notes (Addendum)
ANTICOAGULATION CONSULT NOTE - Initial Consult  Pharmacy Consult for heparin Indication: VTE Treatment  No Known Allergies  Patient Measurements: Height: 5\' 8"  (172.7 cm) Weight: 125 lb (56.7 kg) IBW/kg (Calculated) : 68.4 Heparin Dosing Weight: 56.7 kg  Vital Signs: Temp: 97.7 F (36.5 C) (01/08 1630) Temp Source: Oral (01/08 1630) BP: 135/75 (01/08 1630) Pulse Rate: 69 (01/08 1630)  Labs: No results for input(s): HGB, HCT, PLT, APTT, LABPROT, INR, HEPARINUNFRC, HEPRLOWMOCWT, CREATININE, CKTOTAL, CKMB, TROPONINI in the last 72 hours.  Estimated Creatinine Clearance: 47.9 mL/min (A) (by C-G formula based on SCr of 1.3 mg/dL (H)).   Medical History: Past Medical History:  Diagnosis Date  . Hypertension   . Neuromuscular disorder (HCC)    numbness feet  . Peripheral vascular disease ([place])   . Shortness of breath dyspnea     Medications:  Scheduled:  . sodium chloride flush  3 mL Intravenous Q12H   Infusions:  . sodium chloride 1,000 mL (07/01/18 1656)  . sodium chloride    . ceFAZolin    . heparin     PRN: sodium chloride, HYDROmorphone (DILAUDID) injection, midazolam, morphine injection, ondansetron (ZOFRAN) IV, ondansetron (ZOFRAN) IV, sodium chloride flush  Assessment: Pharmacy consulted to maintenance heparin maintain heparin level 0.2 - 0.5 units.mL for VTE treatment. Pt was on apixaban PTA. Will adjust heparin by aPTT unless heparin level is within the goal range and not effected by the apixaban.   Goal of Therapy:  Heparin level 0.2-0.5 units/ml or aPTT 66-85 seconds Monitor platelets by anticoagulation protocol: Yes   Plan:  Start heparin infusion at 600 units/hr Check anti-Xa level in 6 hours and daily while on heparin Continue to monitor H&H and platelets  Oswald Hillock, PharmD, BCPS Clinical Pharmacist 07/01/2018,6:42 PM

## 2018-07-01 NOTE — Progress Notes (Signed)
Pt gave information for wife that he is currently separated and provided his wife's phone number.  He stated that he did not want her to visit him or receive information.  He also stated that he wanted his daughter, Renda Rolls to be his Marine scientist.  Spoke with Kirkland Hun [phone]) concerning decision-making with Leighton Parody.  She deferred decision-making to his daughter, Neomia Dear

## 2018-07-01 NOTE — Op Note (Signed)
[facility]  Percutaneous Study/Intervention Procedural Note   Date of Surgery: 07/01/2018  Surgeon(s):Shree Espey    Assistants:none  Pre-operative Diagnosis: PAD with rest pain right leg  Post-operative diagnosis:  Same  Procedure(s) Performed:             1.  Ultrasound guidance for vascular access left femoral artery             2.  Catheter placement into right SFA from left femoral approach             3.  Aortogram and selective right lower extremity angiogram             4.   Placement of 8 mg of TPA in the right SFA, popliteal artery, and anterior tibial artery using an infusion catheter  5.   Placement of a 130 cm total length, 50 cm working length infusion catheter for continued thrombolytic therapy overnight  6.   Placement of a left femoral venous triple-lumen catheter with ultrasound and fluoroscopic guidance for venous access  EBL: 5 cc  Contrast: 25 cc  Fluoro Time: 2.3 minutes  Moderate Conscious Sedation Time: approximately 30 minutes using 4 mg of Versed and 150 Mcg of Fentanyl              Indications:  Patient is a 63 y.o.male with continued rest pain of the right foot despite intervention recently. The patient has noninvasive study showing reocclusion of his previous intervention. The patient is brought in for angiography for further evaluation and potential treatment.  Due to the limb threatening nature of the situation, angiogram was performed for attempted limb salvage. The patient is aware that if the procedure fails, amputation would be expected.  The patient also understands that even with successful revascularization, amputation may still be required due to the severity of the situation. Risks and benefits are discussed and informed consent is obtained.   Procedure:  The patient was identified and appropriate procedural time out was performed.  The patient was then placed supine on the table and prepped and draped in the usual sterile  fashion. Moderate conscious sedation was administered during a face to face encounter with the patient throughout the procedure with my supervision of the RN administering medicines and monitoring the patient's vital signs, pulse oximetry, telemetry and mental status throughout from the start of the procedure until the patient was taken to the recovery room. Ultrasound was used to evaluate the left common femoral artery.  It was patent .  A digital ultrasound image was acquired.  A Seldinger needle was used to access the left common femoral artery under direct ultrasound guidance and a permanent image was performed.  A 0.035 J wire was advanced without resistance and a 5Fr sheath was placed.   Order gram was not required as one had been recently done. I then crossed the aortic bifurcation and advanced to the right femoral head.  I then advanced down into the right proximal to mid SFA to try to opacify distally.  Selective right lower extremity angiogram was then performed. This demonstrated reasonably normal common femoral artery, profunda femoris artery, and proximal superficial femoral artery.  There is occlusion of the SFA a few centimeters above the previously placed stent with an abrupt occlusion in the appearance of thrombosis.  There was essentially no distal reconstitution or tibial vessel identified even on delayed imaging. It was felt that it was in the patient's best interest to place an infusion catheter  for infusion of TPA in hopes that this would open up the vessel as well as identify tibial runoff that would be durable.  Using the Glidewire and a Kumpe catheter, I was able to navigate down into the anterior tibial artery and confirm that we were in the vessel although there was not continuous flow to the foot.  A 6 French destination sheath was placed over the aortic bifurcation and down into the right superficial femoral artery.  I then advanced a 130 cm total length 50 cm working length catheter  into the mid anterior tibial artery and back across the popliteal artery and distal and mid SFA.  8 mg of TPA was then instilled through this catheter.  Was then secured into place and will be continued for overnight thrombolytic therapy.  The sheath was secured in place as well.  A dose of 3000 units of heparin was given and the patient will be kept on a heparin drip as well. The patient will need a venous access for blood draws and durable venous access during his thrombolytic therapy.  Ultrasound was used to visualize a patent left femoral vein.  It was then accessed under direct ultrasound guidance without difficulty with a Seldinger needle and a permanent image was recorded.  A J-wire was then placed.  After skin nick and dilatation the triple-lumen catheter was placed over the wire and the wire was removed.  The catheter tip was parked in the distal IVC.  It withdrew blood well and flushed easily with heparinized saline.  It was then secured to the skin with 3 silk sutures.  The patient was then taken to recovery room in stable condition having tolerated the procedure well  Findings:                            Right Lower Extremity:  Reasonably normal common femoral artery, profunda femoris artery, and proximal superficial femoral artery.  There is occlusion of the SFA a few centimeters above the previously placed stent with an abrupt occlusion in the appearance of thrombosis.  There was essentially no distal reconstitution or tibial vessel identified even on delayed imaging.   Disposition: Patient was taken to the recovery room in stable condition having tolerated the procedure well.  Complications: None  Leotis Pain 07/01/2018 6:28 PM   This note was created with Dragon Medical transcription system. Any errors in dictation are purely unintentional.

## 2018-07-01 NOTE — Progress Notes (Signed)
Subjective:    Patient ID: Ryan [name], male    DOB: [birth year], 63 y.o.   MRN: [id number] Chief Complaint  Patient presents with  . Follow-up    HPI  Ryan MCCLENAHAN is a 63 y.o. male that presents today due to continued and worsening lower extremity pain following angiogram on 06/25/2018.  The patient states that after his first angiogram his right lower extremity felt much better he had no rest pain, no claudication and no numbness of his toes.  His immediate follow-up visit showed markedly improved ABIs with strong waveforms.  However he subsequently started to have cramping and numbness in his right lower extremity once again which revealed an occluded stent that was placed in the right lower extremity.  Following the most recent intervention on 06/25/2018 the patient stated that with in 2 to 3 days his leg began to ache and it grew in intensity.  He states that the pain now is constant and severe, he also states that his toes are numb and feel cold.  The patient prior to the first intervention was a heavy smoker, however following his most recent intervention he has stopped smoking.  He denies any fever, chills, nausea vomiting or diarrhea.  He denies any lower extremity wounds.  He denies any chest pain or shortness of breath.  Today he underwent bilateral ABIs which revealed an ABI of 0.33 on the right and 1.04 on the left.  There was no flow detected in any of his right lower extremity digits.  He has monophasic waveforms in his posterior and peritoneal tibial arteries.  His anterior tibial artery was not detected.  His left lower extremity has triphasic waveforms in the posterior tibial artery with biphasic waveforms in the anterior tibial artery.  His previous ABI done on 06/22/2018 was 0.32 with a TBI 0.13.  Past Medical History:  Diagnosis Date  . Hypertension   . Neuromuscular disorder (HCC)    numbness feet  . Peripheral vascular disease ([place])   . Shortness of breath  dyspnea     Past Surgical History:  Procedure Laterality Date  . COLONOSCOPY WITH PROPOFOL N/A 02/15/2016   Procedure: COLONOSCOPY WITH PROPOFOL;  Surgeon: Lucilla Lame, MD;  Location: [facility];  Service: Endoscopy;  Laterality: N/A;  . HERNIA REPAIR  1999   left inguinal  . LOWER EXTREMITY ANGIOGRAPHY Right 05/07/2018   Procedure: LOWER EXTREMITY ANGIOGRAPHY;  Surgeon: Algernon Huxley, MD;  Location: [facility];  Service: Cardiovascular;  Laterality: Right;  . LOWER EXTREMITY ANGIOGRAPHY Right 06/25/2018   Procedure: LOWER EXTREMITY ANGIOGRAPHY;  Surgeon: Algernon Huxley, MD;  Location: [facility];  Service: Cardiovascular;  Laterality: Right;  . POLYPECTOMY N/A 02/15/2016   Procedure: POLYPECTOMY;  Surgeon: Lucilla Lame, MD;  Location: [facility];  Service: Endoscopy;  Laterality: N/A;    Social History   Socioeconomic History  . Marital status: Legally Separated    Spouse name: [name]  . Number of children: 5  . Years of education: Not on file  . Highest education level: High school graduate  Occupational History  . Occupation: Fork[employer]    Comment: [name]  Social Needs  . Financial resource strain: Not hard at all  . Food insecurity:    Worry: Never true    Inability: Never true  . Transportation needs:    Medical: No    Non-medical: No  Tobacco Use  . Smoking status: Current Every Day Smoker  Packs/day: 1.12    Years: 50.00    Pack years: 56.00    Types: Cigarettes  . Smokeless tobacco: Never Used  . Tobacco comment: trying to cut down   Substance and Sexual Activity  . Alcohol use: Yes    Alcohol/week: 9.0 standard drinks    Types: 9 Cans of beer per week  . Drug use: Never  . Sexual activity: Yes    Partners: Female    Birth control/protection: None  Lifestyle  . Physical activity:    Days per week: 6 days    Minutes per session: 60 min  . Stress: Not at all  Relationships  . Social connections:    Talks on phone:  Three times a week    Gets together: Twice a week    Attends religious service: Never    Active member of club or organization: No    Attends meetings of clubs or organizations: Never    Relationship status: Married  . Intimate partner violence:    Fear of current or ex partner: No    Emotionally abused: No    Physically abused: No    Forced sexual activity: No  Other Topics Concern  . Not on file  Social History Narrative  . Not on file    Family History  Problem Relation Age of Onset  . COPD Mother   . Hypertension Father   . Heart attack Brother     No Known Allergies   Review of Systems   Review of Systems: Negative Unless Checked Constitutional: [] Weight loss  [] Fever  [] Chills Cardiac: [] Chest pain   []  Atrial Fibrillation  [] Palpitations   [] Shortness of breath when laying flat   [] Shortness of breath with exertion. [] Shortness of breath at rest Vascular:  [x] Pain in legs with walking   [x] Pain in legs with standing [] Pain in legs when laying flat   [] Claudication    [x] Pain in feet when laying flat    [] History of DVT   [] Phlebitis   [] Swelling in legs   [] Varicose veins   [] Non-healing ulcers Pulmonary:   [] Uses home oxygen   [] Productive cough   [] Hemoptysis   [] Wheeze  [x] COPD   [] Asthma Neurologic:  [] Dizziness   [] Seizures  [] Blackouts [] History of stroke   [] History of TIA  [] Aphasia   [] Temporary Blindness   [] Weakness or numbness in arm   [x] Weakness or numbness in leg Musculoskeletal:   [x] Joint swelling   [] Joint pain   [] Low back pain  []  History of Knee Replacement [] Arthritis [] back Surgeries  []  Spinal Stenosis    Hematologic:  [] Easy bruising  [] Easy bleeding   [] Hypercoagulable state   [] Anemic Gastrointestinal:  [] Diarrhea   [] Vomiting  [] Gastroesophageal reflux/heartburn   [] Difficulty swallowing. [] Abdominal pain Genitourinary:  [] Chronic kidney disease   [] Difficult urination  [] Anuric   [] Blood in urine [] Frequent urination  [] Burning with urination    [] Hematuria Skin:  [] Rashes   [] Ulcers [] Wounds Psychological:  [] History of anxiety   []  History of major depression  []  Memory Difficulties     Objective:   Physical Exam  BP 139/84 (BP Location: Right Arm, Patient Position: Sitting)   Pulse 64   Resp 16   Ht 5\' 8"  (1.727 m)   Wt 124 lb 12.8 oz (56.6 kg)   BMI 18.98 kg/m   Gen: WD/WN, NAD Head: [place]/AT, No temporalis wasting.  Ear/Nose/Throat: Hearing grossly intact, nares w/o erythema or drainage Eyes: PER, EOMI, sclera nonicteric.  Neck: Supple, no masses.  No JVD.  Pulmonary:  Good air movement, no use of accessory muscles.  Cardiac: RRR Vascular:  Right foot cool, somewhat dusky in appearance.  No wounds or ulcerations. Vessel Right Left  Radial Palpable Palpable  Dorsalis Pedis  not palpable  not palpable  Posterior Tibial  not palpable  not palpable   Gastrointestinal: soft, non-distended. No guarding/no peritoneal signs.  Musculoskeletal: M/S 5/5 throughout.  No deformity or atrophy.  Neurologic: Pain and light touch intact in extremities.  Symmetrical.  Speech is fluent. Motor exam as listed above. Psychiatric: Judgment intact, Mood & affect appropriate for pt's clinical situation. Dermatologic: No Venous rashes. No Ulcers Noted.  No changes consistent with cellulitis. Lymph : No Cervical lymphadenopathy, no lichenification or skin changes of chronic lymphedema.      Assessment & Plan:   1. Ischemic leg Today he underwent bilateral ABIs which revealed an ABI of 0.33 on the right and 1.04 on the left.  There was no flow detected in any of his right lower extremity digits.  He has monophasic waveforms in his posterior and peritoneal tibial arteries.  His anterior tibial artery was not detected.  His left lower extremity has triphasic waveforms in the posterior tibial artery with biphasic waveforms in the anterior tibial artery.  His previous ABI done on 06/22/2018 was 0.32 with a TBI 0.13.  Recommend:  The patient  has evidence of severe atherosclerotic changes of the right lower extremities with rest pain that is associated with preulcerative changes and impending tissue loss of the foot.  This represents a limb threatening ischemia and places the patient at the risk for limb loss.  Patient should undergo angiography of the right lower extremity with the hope for intervention for limb salvage.  The risks and benefits as well as the alternative therapies was discussed in detail with the patient.  All questions were answered.  Patient agrees to proceed with angiography.  The patient will follow up with me in the office after the procedure.      2. Hypertension, benign Continue antihypertensive medications as already ordered, these medications have been reviewed and there are no changes at this time.   3. Other emphysema ([place]) Continue pulmonary medications and aerosols as already ordered, these medications have been reviewed and there are no changes at this time.     Current Outpatient Medications on File Prior to Visit  Medication Sig Dispense Refill  . apixaban (ELIQUIS) 5 MG TABS tablet Take 1 tablet (5 mg total) by mouth 2 (two) times daily. 60 tablet 2  . aspirin EC 81 MG tablet Take 1 tablet (81 mg total) by mouth daily. 30 tablet 0  . atorvastatin (LIPITOR) 20 MG tablet Take 1 tablet (20 mg total) by mouth daily. 90 tablet 0  . chlorthalidone (HYGROTON) 25 MG tablet Take 1 tablet (25 mg total) by mouth daily. 30 tablet 1  . Cyanocobalamin (VITAMIN B-12) 1000 MCG SUBL Place 1 tablet (1,000 mcg total) under the tongue daily. 90 tablet 0  . HYDROcodone-acetaminophen (NORCO/VICODIN) 5-325 MG tablet Take 1-2 tablets by mouth every 4 (four) hours as needed for moderate pain. 30 tablet 0  . lisinopril (PRINIVIL,ZESTRIL) 20 MG tablet Take 1 tablet (20 mg total) by mouth daily. 90 tablet 0  . nicotine (NICODERM CQ - DOSED IN MG/24 HOURS) 14 mg/24hr patch Place 1 patch (14 mg total) onto the skin daily.  28 patch 0  . nystatin-triamcinolone ointment (MYCOLOG) Apply 1 application topically 2 (two) times daily. 30 g  0  . umeclidinium-vilanterol (ANORO ELLIPTA) 62.5-25 MCG/INH AEPB Inhale 1 puff into the lungs daily. 180 each 0   No current facility-administered medications on file prior to visit.     There are no Patient Instructions on file for this visit. No follow-ups on file.   Kris Hartmann, NP  This note was completed with Sales executive.  Any errors are purely unintentional.

## 2018-07-01 NOTE — Progress Notes (Signed)
[facility] Progress Note Patient Name: Ryan [name] DOB: 1955-07-22 MRN: [id number]   Date of Service  07/01/2018  HPI/Events of Note  62/M with severe atherosclerotic changed of the right lower extremity .  He is awake and alert.  He is complaining of pain.   eICU Interventions  Continue management as per vascular surgery.     Intervention Category Evaluation Type: New Patient Evaluation  Elsie Lincoln 07/01/2018, 8:12 PM

## 2018-07-01 NOTE — H&P (Signed)
[facility] History & Physical Update  The patient was interviewed and re-examined.  The patient's previous History and Physical has been reviewed and is unchanged.  There is no change in the plan of care. We plan to proceed with the scheduled procedure.  Leotis Pain, MD  07/01/2018, 4:31 PM

## 2018-07-02 ENCOUNTER — Encounter
Admission: AD | Disposition: A | Discharge status: Home / Self Care | Payer: Self-pay | Source: Ambulatory Visit | Attending: Vascular Surgery

## 2018-07-02 ENCOUNTER — Encounter: Payer: Self-pay | Admitting: Vascular Surgery

## 2018-07-02 DIAGNOSIS — I70221 Atherosclerosis of native arteries of extremities with rest pain, right leg: Secondary | ICD-10-CM

## 2018-07-02 HISTORY — PX: LOWER EXTREMITY INTERVENTION: CATH118252

## 2018-07-02 LAB — CBC
HCT: 28.2 % — ABNORMAL LOW (ref 39.0–52.0)
HCT: 30.9 % — ABNORMAL LOW (ref 39.0–52.0)
HCT: 31.5 % — ABNORMAL LOW (ref 39.0–52.0)
HEMOGLOBIN: 9.3 g/dL — AB (ref 13.0–17.0)
Hemoglobin: 10.1 g/dL — ABNORMAL LOW (ref 13.0–17.0)
Hemoglobin: 10.4 g/dL — ABNORMAL LOW (ref 13.0–17.0)
MCH: 31.7 pg (ref 26.0–34.0)
MCH: 31.8 pg (ref 26.0–34.0)
MCH: 32.4 pg (ref 26.0–34.0)
MCHC: 32.7 g/dL (ref 30.0–36.0)
MCHC: 33 g/dL (ref 30.0–36.0)
MCHC: 33 g/dL (ref 30.0–36.0)
MCV: 97.2 fL (ref 80.0–100.0)
MCV: 98.3 fL (ref 80.0–100.0)
MCV: 96 fL (ref 80.0–100.0)
Platelets: 199 10*3/uL (ref 150–400)
Platelets: 225 10*3/uL (ref 150–400)
Platelets: 233 10*3/uL (ref 150–400)
RBC: 2.87 MIL/uL — ABNORMAL LOW (ref 4.22–5.81)
RBC: 3.18 MIL/uL — ABNORMAL LOW (ref 4.22–5.81)
RBC: 3.28 MIL/uL — ABNORMAL LOW (ref 4.22–5.81)
RDW: 11.7 % (ref 11.5–15.5)
RDW: 11.9 % (ref 11.5–15.5)
RDW: 11.7 % (ref 11.5–15.5)
WBC: 4.9 10*3/uL (ref 4.0–10.5)
WBC: 5 10*3/uL (ref 4.0–10.5)
WBC: 5.5 10*3/uL (ref 4.0–10.5)
nRBC: 0 % (ref 0.0–0.2)
nRBC: 0 % (ref 0.0–0.2)
nRBC: 0 % (ref 0.0–0.2)

## 2018-07-02 LAB — BASIC METABOLIC PANEL
Anion gap: 6 (ref 5–15)
BUN: 31 mg/dL — ABNORMAL HIGH (ref 8–23)
CO2: 24 mmol/L (ref 22–32)
Calcium: 8.5 mg/dL — ABNORMAL LOW (ref 8.9–10.3)
Chloride: 105 mmol/L (ref 98–111)
Creatinine, Ser: 2.16 mg/dL — ABNORMAL HIGH (ref 0.61–1.24)
GFR calc Af Amer: 37 mL/min — ABNORMAL LOW (ref >60)
GFR calc non Af Amer: 32 mL/min — ABNORMAL LOW (ref >60)
Glucose, Bld: 111 mg/dL — ABNORMAL HIGH (ref 70–99)
Potassium: 4.6 mmol/L (ref 3.5–5.1)
Sodium: 135 mmol/L (ref 135–145)

## 2018-07-02 LAB — APTT
aPTT: 77 seconds — ABNORMAL HIGH (ref 24–36)
aPTT: 83 seconds — ABNORMAL HIGH (ref 24–36)

## 2018-07-02 LAB — HEPARIN LEVEL (UNFRACTIONATED)
Heparin Unfractionated: 0.44 IU/mL (ref 0.30–0.70)
Heparin Unfractionated: 0.65 IU/mL (ref 0.30–0.70)

## 2018-07-02 LAB — FIBRINOGEN
Fibrinogen: 365 mg/dL (ref 210–475)
Fibrinogen: 370 mg/dL (ref 210–475)

## 2018-07-02 SURGERY — LOWER EXTREMITY INTERVENTION
Anesthesia: Moderate Sedation | Laterality: Right

## 2018-07-02 MED ORDER — NICOTINE 14 MG/24HR TD PT24
14.0000 mg | MEDICATED_PATCH | Freq: Every day | TRANSDERMAL | Status: DC
  Administered 2018-07-02 – 2018-07-03 (×2): 14 mg via TRANSDERMAL
  Filled 2018-07-02 (×2): qty 1

## 2018-07-02 MED ORDER — MORPHINE SULFATE (PF) 2 MG/ML IV SOLN: 1.0000 mg | INTRAVENOUS | Status: DC | PRN

## 2018-07-02 MED ORDER — HEPARIN (PORCINE) IN NACL 1000-0.9 UT/500ML-% IV SOLN
INTRAVENOUS | Status: AC
  Filled 2018-07-02: qty 1000

## 2018-07-02 MED ORDER — SODIUM CHLORIDE 0.9 % IV BOLUS
500.0000 mL | Freq: Once | INTRAVENOUS | Status: AC
  Administered 2018-07-02: 500 mL via INTRAVENOUS

## 2018-07-02 MED ORDER — LISINOPRIL 20 MG PO TABS
20.0000 mg | ORAL_TABLET | Freq: Every day | ORAL | Status: DC
  Administered 2018-07-02 – 2018-07-03 (×2): 20 mg via ORAL
  Filled 2018-07-02: qty 1
  Filled 2018-07-02 (×2): qty 2
  Filled 2018-07-02: qty 4

## 2018-07-02 MED ORDER — MIDAZOLAM HCL 5 MG/5ML IJ SOLN
INTRAMUSCULAR | Status: AC
  Filled 2018-07-02: qty 5

## 2018-07-02 MED ORDER — FENTANYL CITRATE (PF) 100 MCG/2ML IJ SOLN
INTRAMUSCULAR | Status: AC
  Filled 2018-07-02: qty 2

## 2018-07-02 MED ORDER — CHLORTHALIDONE 25 MG PO TABS
25.0000 mg | ORAL_TABLET | Freq: Every day | ORAL | Status: DC
  Administered 2018-07-02 – 2018-07-03 (×2): 25 mg via ORAL
  Filled 2018-07-02 (×3): qty 1

## 2018-07-02 MED ORDER — IOPAMIDOL (ISOVUE-300) INJECTION 61%
INTRAVENOUS | Status: DC | PRN
  Administered 2018-07-02: 35 mL via INTRA_ARTERIAL

## 2018-07-02 MED ORDER — APIXABAN 5 MG PO TABS
5.0000 mg | ORAL_TABLET | Freq: Two times a day (BID) | ORAL | Status: DC
  Administered 2018-07-03: 5 mg via ORAL
  Filled 2018-07-02: qty 1

## 2018-07-02 MED ORDER — ASPIRIN EC 81 MG PO TBEC
81.0000 mg | DELAYED_RELEASE_TABLET | Freq: Every day | ORAL | Status: DC
  Administered 2018-07-02 – 2018-07-03 (×2): 81 mg via ORAL
  Filled 2018-07-02 (×3): qty 1

## 2018-07-02 MED ORDER — FENTANYL CITRATE (PF) 100 MCG/2ML IJ SOLN
INTRAMUSCULAR | Status: DC | PRN
  Administered 2018-07-02: 25 ug via INTRAVENOUS
  Administered 2018-07-02: 50 ug via INTRAVENOUS

## 2018-07-02 MED ORDER — LIDOCAINE-EPINEPHRINE (PF) 1 %-1:200000 IJ SOLN
INTRAMUSCULAR | Status: AC
  Filled 2018-07-02: qty 10

## 2018-07-02 MED ORDER — CEFAZOLIN SODIUM-DEXTROSE 2-4 GM/100ML-% IV SOLN
INTRAVENOUS | Status: AC
  Filled 2018-07-02: qty 100

## 2018-07-02 MED ORDER — HYDROCODONE-ACETAMINOPHEN 5-325 MG PO TABS
1.0000 | ORAL_TABLET | ORAL | Status: DC | PRN
  Administered 2018-07-02 – 2018-07-03 (×5): 1 via ORAL
  Filled 2018-07-02 (×5): qty 1

## 2018-07-02 MED ORDER — ATORVASTATIN CALCIUM 20 MG PO TABS
20.0000 mg | ORAL_TABLET | Freq: Every day | ORAL | Status: DC
  Administered 2018-07-02 – 2018-07-03 (×2): 20 mg via ORAL
  Filled 2018-07-02 (×2): qty 1

## 2018-07-02 MED ORDER — HEPARIN SODIUM (PORCINE) 1000 UNIT/ML IJ SOLN
INTRAMUSCULAR | Status: AC
  Filled 2018-07-02: qty 1

## 2018-07-02 MED ORDER — UMECLIDINIUM-VILANTEROL 62.5-25 MCG/INH IN AEPB
1.0000 | INHALATION_SPRAY | Freq: Every day | RESPIRATORY_TRACT | Status: DC
  Administered 2018-07-02 – 2018-07-03 (×2): 1 via RESPIRATORY_TRACT
  Filled 2018-07-02 (×2): qty 14

## 2018-07-02 MED ORDER — VITAMIN B-12 100 MCG PO TABS
100.0000 ug | ORAL_TABLET | Freq: Every day | ORAL | Status: DC
  Administered 2018-07-02 – 2018-07-03 (×2): 100 ug via ORAL
  Filled 2018-07-02 (×2): qty 1

## 2018-07-02 MED ORDER — MIDAZOLAM HCL 2 MG/2ML IJ SOLN
INTRAMUSCULAR | Status: DC | PRN
  Administered 2018-07-02: 2 mg via INTRAVENOUS
  Administered 2018-07-02: 1 mg via INTRAVENOUS

## 2018-07-02 MED ORDER — HEPARIN (PORCINE) 25000 UT/250ML-% IV SOLN
800.0000 [IU]/h | INTRAVENOUS | Status: AC
  Administered 2018-07-02: 800 [IU]/h via INTRAVENOUS
  Filled 2018-07-02: qty 250

## 2018-07-02 SURGICAL SUPPLY — 6 items
CANISTER PENUMBRA ENGINE (MISCELLANEOUS) ×1 IMPLANT
CATH INDIGO CAT6 KIT (CATHETERS) ×1 IMPLANT
DEVICE STARCLOSE SE CLOSURE (Vascular Products) ×1 IMPLANT
PACK ANGIOGRAPHY (CUSTOM PROCEDURE TRAY) ×1 IMPLANT
WIRE G V18X300CM (WIRE) ×1 IMPLANT
WIRE J 3MM .035X145CM (WIRE) ×1 IMPLANT

## 2018-07-02 NOTE — Progress Notes (Signed)
ANTICOAGULATION CONSULT NOTE - Initial Consult  Pharmacy Consult for heparin Indication: VTE Treatment  No Known Allergies  Patient Measurements: Height: 5\' 8"  (172.7 cm) Weight: 111 lb 5.3 oz (50.5 kg) IBW/kg (Calculated) : 68.4 Heparin Dosing Weight: 56.7 kg  Vital Signs: Temp: 97.7 F (36.5 C) (01/08 1630) Temp Source: Oral (01/08 1630) BP: 103/75 (01/09 0300) Pulse Rate: 66 (01/09 0300)  Labs: Recent Labs    07/01/18 2028 07/02/18 0015  HGB 10.2* 10.4*  HCT 31.0* 31.5*  PLT 255 225  APTT  --  83*  HEPARINUNFRC  --  0.65  CREATININE 1.60*  --     Estimated Creatinine Clearance: 34.6 mL/min (A) (by C-G formula based on SCr of 1.6 mg/dL (H)).   Medical History: Past Medical History:  Diagnosis Date  . Hypertension   . Neuromuscular disorder (HCC)    numbness feet  . Peripheral vascular disease ([place])   . Shortness of breath dyspnea     Medications:  Scheduled:  . morphine      . sodium chloride flush  3 mL Intravenous Q12H   Infusions:  . sodium chloride 10 mL/hr at 07/02/18 0000  . alteplase (LIMB ISCHEMIA) 10 mg in normal saline (0.02 mg/mL) infusion 0.5 mg/hr (07/02/18 0000)  . ceFAZolin    . heparin 800 Units/hr (07/02/18 0000)   PRN: sodium chloride, hydrALAZINE, HYDROmorphone (DILAUDID) injection, midazolam, morphine injection, ondansetron (ZOFRAN) IV, sodium chloride flush  Assessment: Pharmacy consulted to maintenance heparin maintain heparin level 0.2 - 0.5 units.mL for VTE treatment. Pt was on apixaban PTA. Will adjust heparin by aPTT unless heparin level is within the goal range and not effected by the apixaban.   Goal of Therapy:  Heparin level 0.2-0.5 units/ml or aPTT 66-85 seconds Monitor platelets by anticoagulation protocol: Yes   Plan:  Start heparin infusion at 600 units/hr Check anti-Xa level in 6 hours and daily while on heparin Continue to monitor H&H and platelets  1/9 aPTT 83. Continue current regimen. Recheck aPTT, heparin  level, and CBC with tomorrow AM labs.  Eloise Harman, PharmD, BCPS Clinical Pharmacist 07/02/2018,3:22 AM

## 2018-07-02 NOTE — Progress Notes (Signed)
ANTICOAGULATION CONSULT NOTE - Initial Consult  Pharmacy Consult for heparin Indication: VTE Treatment  No Known Allergies  Patient Measurements: Height: 5\' 8"  (172.7 cm) Weight: 111 lb 5.3 oz (50.5 kg) IBW/kg (Calculated) : 68.4 Heparin Dosing Weight: 56.7 kg  Vital Signs: Temp: 98.9 F (37.2 C) (01/09 1300) Temp Source: Oral (01/09 1300) BP: 129/101 (01/09 1700) Pulse Rate: 83 (01/09 1700)  Labs: Recent Labs    07/01/18 2028 07/02/18 0015 07/02/18 0501 07/02/18 1248 07/02/18 1707  HGB 10.2* 10.4* 10.1* 9.3*  --   HCT 31.0* 31.5* 30.9* 28.2*  --   PLT 255 225 233 199  --   APTT  --  83*  --   --  77*  HEPARINUNFRC  --  0.65  --   --  0.44  CREATININE 1.60*  --  2.16*  --   --     Estimated Creatinine Clearance: 25.7 mL/min (A) (by C-G formula based on SCr of 2.16 mg/dL (H)).   Medical History: Past Medical History:  Diagnosis Date  . Hypertension   . Neuromuscular disorder (HCC)    numbness feet  . Peripheral vascular disease ([place])   . Shortness of breath dyspnea     Medications:  Scheduled:  . [START ON 07/03/2018] apixaban  5 mg Oral BID  . aspirin EC  81 mg Oral Daily  . atorvastatin  20 mg Oral Daily  . chlorthalidone  25 mg Oral Daily  . lisinopril  20 mg Oral Daily  . nicotine  14 mg Transdermal Daily  . sodium chloride flush  3 mL Intravenous Q12H  . umeclidinium-vilanterol  1 puff Inhalation Daily  . vitamin B-12  100 mcg Oral Daily   Infusions:  . sodium chloride 10 mL/hr at 07/02/18 0400  . ceFAZolin    . heparin 800 Units/hr (07/02/18 1041)   PRN: sodium chloride, hydrALAZINE, HYDROcodone-acetaminophen, HYDROmorphone (DILAUDID) injection, morphine injection, ondansetron (ZOFRAN) IV, sodium chloride flush  Assessment: Pharmacy consulted to maintenance heparin for arterial thrombosis. Pt was on apixaban PTA. Will adjust heparin by heparin level. aptt and heparin seem to be correlating.   1/9 S/P Thrombectomy. Pharmacy to restart heparin  drip for 24 hours. Patient's home dose to restart @ 1000 on 1/9 per Dr. Lucky Cowboy.    Goal of Therapy:  Heparin level 0.2-0.7 units/ml aPTT 66-102 seconds Monitor platelets by anticoagulation protocol: Yes   Plan:  Restarted heparin infusion at 800 units/hr. Will continue the current rate as the aPTT and heparin level were within goal range.  Check anti-Xa level with AM labs and daily while on heparin.  Continue to monitor H&H and platelets  Heparin drip stop time @ 0959 1/10. Home dose apixaban to start @ 1000 on 1/10 per Dr. Lucky Cowboy.   Oswald Hillock, PharmD, BCPS Clinical Pharmacist 07/02/2018,6:01 PM

## 2018-07-02 NOTE — Progress Notes (Signed)
[facility]  Daily Progress Note   Subjective  - Day of Surgery  Still with some lower leg pain.  Not much swelling  Objective Vitals:   07/02/18 1300 07/02/18 1400 07/02/18 1500 07/02/18 1600  BP: 103/64 139/70 (!) 152/72 (!) 152/73  Pulse: 79 81 77 82  Resp: 16 18 19 14   Temp: 98.9 F (37.2 C)     TempSrc: Oral     SpO2: 99% 99% 99% 99%  Weight:      Height:        Intake/Output Summary (Last 24 hours) at 07/02/2018 1617 Last data filed at 07/02/2018 1500 Gross per 24 hour  Intake 827.83 ml  Output 1575 ml  Net -747.17 ml    PULM  CTAB CV  RRR VASC  2+ right DP pulse, foot warm and good cap refill.  Access site left groin without hematoma  Laboratory CBC    Component Value Date/Time   WBC 4.9 07/02/2018 1248   HGB 9.3 (L) 07/02/2018 1248   HGB 13.1 08/31/2015 0929   HCT 28.2 (L) 07/02/2018 1248   HCT 38.5 08/31/2015 0929   PLT 199 07/02/2018 1248   PLT 217 08/31/2015 0929    BMET    Component Value Date/Time   NA 135 07/02/2018 0501   NA 137 08/31/2015 0929   K 4.6 07/02/2018 0501   CL 105 07/02/2018 0501   CO2 24 07/02/2018 0501   GLUCOSE 111 (H) 07/02/2018 0501   BUN 31 (H) 07/02/2018 0501   BUN 11 08/31/2015 0929   CREATININE 2.16 (H) 07/02/2018 0501   CREATININE 1.46 (H) 06/22/2018 1513   CALCIUM 8.5 (L) 07/02/2018 0501   GFRNONAA 32 (L) 07/02/2018 0501   GFRNONAA 51 (L) 06/22/2018 1513   GFRAA 37 (L) 07/02/2018 0501   GFRAA 59 (L) 06/22/2018 1513    Assessment/Planning: POD #0/1 s/p RLE revascularization   Doing well  OK to ambulate and take out the central line.  Heparin today.  Transition to Eliquis tomorrow and likely discharge tomorrow  Follow up in office next week for ABIs     Leotis Pain  07/02/2018, 4:17 PM

## 2018-07-02 NOTE — Progress Notes (Signed)
Central line removed per MD order with no issue.  Patient ambulated x1 around nursing station, patient tolerated well.

## 2018-07-02 NOTE — Progress Notes (Signed)
Dr Lucky Cowboy at bedside to see patient.  RN clarify PRN pain order of 5mg  of morphine, per Dr Lucky Cowboy "that was probably an order set, you can discontinue that and order 1-2mg  IV morphine push every 2 hours for pain"

## 2018-07-02 NOTE — Progress Notes (Deleted)
Pt belligerent with staff. She is demanding ice water despite being NPO.  Pt is alert and oriented x 4.  When asked, she understands why she is NPO for her procedure.  She states "I don't care, I want a cup of ice water anyway."   Pt continues to yell at the staff.

## 2018-07-02 NOTE — Op Note (Signed)
[facility]  Percutaneous Study/Intervention Procedural Note   Date of Surgery: 07/02/2018  Surgeon(s):Charnette Younkin    Assistants:none  Pre-operative Diagnosis: PAD with rest [name]  Post-operative diagnosis:  Same  Procedure(s) Performed:             1.  Ultrasound guidance for vascular access left femoral artery             2.  Catheter placement into right SFA from left femoral approach             3.  Selective right lower extremity angiogram             4.   Mechanical thrombectomy using the penumbra cat 6 device to the right SFA, popliteal artery, and anterior tibial artery             5.  StarClose closure device left femoral artery  EBL: 100  Contrast: 35 cc  Fluoro Time: 2 minutes  Moderate Conscious Sedation Time: approximately 20 minutes using 3 mg of Versed and 75 Mcg of Fentanyl              Indications:  Patient is a 62 y.o.male with an ischemic right leg.  He has been running on TPA overnight. The patient is brought in for angiography for further evaluation and potential treatment.  Due to the limb threatening nature of the situation, angiogram was performed for attempted limb salvage. The patient is aware that if the procedure fails, amputation would be expected.  The patient also understands that even with successful revascularization, amputation may still be required due to the severity of the situation. Risks and benefits are discussed and informed consent is obtained.   Procedure:  The patient was identified and appropriate procedural time out was performed.  The patient was then placed supine on the table and prepped and draped in the usual sterile fashion. Moderate conscious sedation was administered during a face to face encounter with the patient throughout the procedure with my supervision of the RN administering medicines and monitoring the patient's vital signs, pulse oximetry, telemetry and mental status throughout from the start  of the procedure until the patient was taken to the recovery room.  The existing lytic catheter was removed and a wire was parked into the distal anterior tibial artery.  The penumbra cat 6 device was then brought onto the field and mechanical thrombectomy was performed throughout the right SFA, popliteal artery, and anterior tibial artery down to the mid to distal segments.  Selective right lower extremity angiogram was then performed. At the conclusion of the TPA infusion and the thrombectomy device used today, the patient had no significant residual stenosis in the right SFA or popliteal arteries.  The anterior tibial artery, posterior tibial artery, and peroneal artery all had flow distally although some spasm was present in the anterior tibial artery and some degree of stenosis was present in the proximal posterior tibial artery.  At this point, his flow was markedly improved and I felt any further manipulation or intervention of the tibial vessels would not be of benefit and may impair the runoff. I elected to terminate the procedure. The sheath was removed and StarClose closure device was deployed in the left femoral artery with excellent hemostatic result. The patient was taken to the recovery room in stable condition having tolerated the procedure well.  Findings:  Right Lower Extremity:  At the conclusion of the TPA infusion and the thrombectomy device used today, the patient had no significant residual stenosis in the right SFA or popliteal arteries.  The anterior tibial artery, posterior tibial artery, and peroneal artery all had flow distally although some spasm was present in the anterior tibial artery and some degree of stenosis was present in the proximal posterior tibial artery.   Disposition: Patient was taken to the recovery room in stable condition having tolerated the procedure well.  Complications: None  Ryan [name] 07/02/2018 8:47 AM   This note was  created with Dragon Medical transcription system. Any errors in dictation are purely unintentional.

## 2018-07-02 NOTE — Progress Notes (Signed)
ANTICOAGULATION CONSULT NOTE - Initial Consult  Pharmacy Consult for heparin Indication: VTE Treatment  No Known Allergies  Patient Measurements: Height: 5\' 8"  (172.7 cm) Weight: 111 lb 5.3 oz (50.5 kg) IBW/kg (Calculated) : 68.4 Heparin Dosing Weight: 56.7 kg  Vital Signs: Temp: 97.8 F (36.6 C) (01/09 0746) Temp Source: Oral (01/09 0746) BP: 120/78 (01/09 1107) Pulse Rate: 73 (01/09 0915)  Labs: Recent Labs    07/01/18 2028 07/02/18 0015 07/02/18 0501  HGB 10.2* 10.4* 10.1*  HCT 31.0* 31.5* 30.9*  PLT 255 225 233  APTT  --  83*  --   HEPARINUNFRC  --  0.65  --   CREATININE 1.60*  --  2.16*    Estimated Creatinine Clearance: 25.7 mL/min (A) (by C-G formula based on SCr of 2.16 mg/dL (H)).   Medical History: Past Medical History:  Diagnosis Date  . Hypertension   . Neuromuscular disorder (HCC)    numbness feet  . Peripheral vascular disease ([place])   . Shortness of breath dyspnea     Medications:  Scheduled:  . [START ON 07/03/2018] apixaban  5 mg Oral BID  . aspirin EC  81 mg Oral Daily  . atorvastatin  20 mg Oral Daily  . chlorthalidone  25 mg Oral Daily  . lisinopril  20 mg Oral Daily  . nicotine  14 mg Transdermal Daily  . sodium chloride flush  3 mL Intravenous Q12H  . umeclidinium-vilanterol  1 puff Inhalation Daily  . vitamin B-12  100 mcg Oral Daily   Infusions:  . sodium chloride 10 mL/hr at 07/02/18 0400  . ceFAZolin    . heparin 800 Units/hr (07/02/18 1041)   PRN: sodium chloride, hydrALAZINE, HYDROcodone-acetaminophen, HYDROmorphone (DILAUDID) injection, midazolam, morphine injection, ondansetron (ZOFRAN) IV, sodium chloride flush  Assessment: Pharmacy consulted to maintenance heparin for arterial thrombosis. Pt was on apixaban PTA. Will adjust heparin by aPTT unless heparin level is within the goal range and not effected by the apixaban.   1/9 S/P Thrombectomy. Pharmacy to restart heparin drip for 24 hours. Patient's home dose to restart  @ 1000 on 1/9 per Dr. Lucky Cowboy.    Goal of Therapy:  Heparin level 0.2-0.7 units/ml aPTT 66-102 seconds Monitor platelets by anticoagulation protocol: Yes   Plan:  Restart heparin infusion at 600 units/hr Check anti-Xa level in 6 hours and daily while on heparin Continue to monitor H&H and platelets  Heparin drip stop time @ 0959 1/10. Home dose apixaban to start @ 1000 on 1/10 per Dr. Lucky Cowboy.   Pernell Dupre, PharmD, BCPS Clinical Pharmacist 07/02/2018,11:17 AM

## 2018-07-03 DIAGNOSIS — I998 Other disorder of circulatory system: Secondary | ICD-10-CM

## 2018-07-03 LAB — HEPARIN LEVEL (UNFRACTIONATED): Heparin Unfractionated: 0.62 IU/mL (ref 0.30–0.70)

## 2018-07-03 LAB — CBC
HEMATOCRIT: 30.4 % — AB (ref 39.0–52.0)
Hemoglobin: 9.9 g/dL — ABNORMAL LOW (ref 13.0–17.0)
MCH: 31.2 pg (ref 26.0–34.0)
MCHC: 32.6 g/dL (ref 30.0–36.0)
MCV: 95.9 fL (ref 80.0–100.0)
Platelets: 221 10*3/uL (ref 150–400)
RBC: 3.17 MIL/uL — ABNORMAL LOW (ref 4.22–5.81)
RDW: 11.6 % (ref 11.5–15.5)
WBC: 4.5 10*3/uL (ref 4.0–10.5)
nRBC: 0 % (ref 0.0–0.2)

## 2018-07-03 MED ORDER — HYDROCODONE-ACETAMINOPHEN 5-325 MG PO TABS: 1.0000 | ORAL_TABLET | ORAL | 0 refills | Status: DC | PRN

## 2018-07-03 NOTE — Progress Notes (Signed)
ANTICOAGULATION CONSULT NOTE - Initial Consult  Pharmacy Consult for heparin Indication: VTE Treatment  No Known Allergies  Patient Measurements: Height: 5\' 8"  (172.7 cm) Weight: 116 lb 12.8 oz (53 kg) IBW/kg (Calculated) : 68.4 Heparin Dosing Weight: 56.7 kg  Vital Signs: Temp: 97.8 F (36.6 C) (01/10 0455) Temp Source: Oral (01/10 0455) BP: 119/80 (01/10 0455) Pulse Rate: 69 (01/10 0455)  Labs: Recent Labs    07/01/18 2028 07/02/18 0015 07/02/18 0501 07/02/18 1248 07/02/18 1707 07/03/18 0503  HGB 10.2* 10.4* 10.1* 9.3*  --  9.9*  HCT 31.0* 31.5* 30.9* 28.2*  --  30.4*  PLT 255 225 233 199  --  221  APTT  --  83*  --   --  77*  --   HEPARINUNFRC  --  0.65  --   --  0.44 0.62  CREATININE 1.60*  --  2.16*  --   --   --     Estimated Creatinine Clearance: 26.9 mL/min (A) (by C-G formula based on SCr of 2.16 mg/dL (H)).   Medical History: Past Medical History:  Diagnosis Date  . Hypertension   . Neuromuscular disorder (HCC)    numbness feet  . Peripheral vascular disease ([place])   . Shortness of breath dyspnea     Medications:  Scheduled:  . apixaban  5 mg Oral BID  . aspirin EC  81 mg Oral Daily  . atorvastatin  20 mg Oral Daily  . chlorthalidone  25 mg Oral Daily  . lisinopril  20 mg Oral Daily  . nicotine  14 mg Transdermal Daily  . sodium chloride flush  3 mL Intravenous Q12H  . umeclidinium-vilanterol  1 puff Inhalation Daily  . vitamin B-12  100 mcg Oral Daily   Infusions:  . sodium chloride Stopped (07/02/18 2000)  . heparin 800 Units/hr (07/02/18 1041)   PRN: sodium chloride, hydrALAZINE, HYDROcodone-acetaminophen, HYDROmorphone (DILAUDID) injection, morphine injection, ondansetron (ZOFRAN) IV, sodium chloride flush  Assessment: Pharmacy consulted to maintenance heparin for arterial thrombosis. Pt was on apixaban PTA. Will adjust heparin by heparin level. aptt and heparin seem to be correlating.   1/9 S/P Thrombectomy. Pharmacy to restart  heparin drip for 24 hours. Patient's home dose to restart @ 1000 on 1/9 per Dr. Lucky Cowboy.    Goal of Therapy:  Heparin level 0.2-0.7 units/ml aPTT 66-102 seconds Monitor platelets by anticoagulation protocol: Yes   Plan:  Restarted heparin infusion at 800 units/hr. Will continue the current rate as the aPTT and heparin level were within goal range.  Check anti-Xa level with AM labs and daily while on heparin.  Continue to monitor H&H and platelets  Heparin drip stop time @ 0959 1/10. Home dose apixaban to start @ 1000 on 1/10 per Dr. Lucky Cowboy.   1/10 AM heparin level 0.62. Continue current regimen. Recheck heparin level and CBC with tomorrow AM labs.  Francene Mcerlean S, PharmD, BCPS Clinical Pharmacist 07/03/2018,6:55 AM

## 2018-07-03 NOTE — Discharge Instructions (Signed)
You may shower as of tomorrow. Please keep your groin clean and dry.

## 2018-07-03 NOTE — Care Management Note (Signed)
Case Management Note  Patient Details  Name: Ryan [name] MRN: [id number] Date of Birth: [birth year]  Subjective/Objective:     Patient is from home with brother.  Status post angiogram.   Obtains prescriptions at [facility] on [address].  Current with PCP, Dr. Rebecka Apley.  Eliquis 30 day free and $10 co-pay assist coupons given to patient.  He has [employer].  Denies difficulties obtaining medications or with accessing medical care.  His daughter will transport to home today.  No home health needs.       Action/Plan:   Expected Discharge Date:  07/03/18               Expected Discharge Plan:  Home/Self Care  In-House Referral:     Discharge planning Services  CM Consult  Post Acute Care Choice:    Choice offered to:     DME Arranged:    DME Agency:     HH Arranged:    HH Agency:     Status of Service:  Completed, signed off  If discussed at [employer] of Stay Meetings, dates discussed:    Additional Comments:  Elza Rafter, RN 07/03/2018, 11:45 AM

## 2018-07-03 NOTE — Progress Notes (Signed)
Pt discharged to home via wc.  Instructions  given to pt.  Questions answered.  No distress.  

## 2018-07-03 NOTE — Discharge Summary (Signed)
[facility]    Discharge Summary   Patient ID:  Ryan [name] MRN: [id number] DOB/AGE: 01/26/1956 64 y.o.  Admit date: 07/01/2018 Discharge date: 07/03/2018 Date of Surgery: 07/02/2018 Surgeon: Surgeon(s): Algernon Huxley, MD  Admission Diagnosis: Ischemic leg [I99.8]  Discharge Diagnoses:  Ischemic leg [I99.8]  Secondary Diagnoses: Past Medical History:  Diagnosis Date  . Hypertension   . Neuromuscular disorder (HCC)    numbness feet  . Peripheral vascular disease ([place])   . Shortness of breath dyspnea    Procedure(s): LOWER EXTREMITY INTERVENTION  Discharged Condition: good  HPI:  The patient is a 63 year old male with a known history of peripheral artery disease well-known to our service.  The patient presented to our clinic with progressively worsening right lower extremity rest pain.  ABIs to the right lower extremity were noted that 0.33.  During the patient's brief patient stay he underwent the following procedures:   07/01/18: Ultrasound guidance for vascular access left femoral artery, Catheter placement into right SFA from left femoral approach, Aortogram and selective right lower extremity angiogram, Placement of 8 mg of TPA in the right SFA, popliteal artery, and anterior tibial artery using an infusion catheter, Placement of a 130 cm total length, 50 cm working length infusion catheter for continued thrombolytic therapy overnight, Placement of a left femoral venous triple-lumen catheter with ultrasound and fluoroscopic guidance for venous access.  07/02/18: Ultrasound guidance for vascular access left femoral artery, Catheter placement into right SFA from left femoral approach, Selective right lower extremity angiogram, Mechanical thrombectomy using the penumbra cat 6 device to the right SFA, popliteal artery, and anterior tibial artery with StarClose closure device left femoral artery  The patient tolerated both procedures without complication.   The patient's brief inpatient stay was without issue.  The patient was treated with a heparin drip status post both endovascular interventions.  He was transitioned to Eliquis without complication.  Upon discharge, there was a noticeable improvement to the patient's physical exam specifically his right lower extremity which is now warm with palpable pulses.  Upon discharge, the patient was tolerating a regular diet, his pain was controlled with the use of p.o. pain medication, he was urinating independently and ambulating without issue.  Hospital Course:  Ryan [name] is a 63 y.o. male is S/P:  Procedure(s): LOWER EXTREMITY INTERVENTION x2 (Right)  Extubated: POD # 0  Physical exam:  A&Ox3, NAD CV: RRR Pulmonary: CTA Bilaterally Abdomen: soft, non-tender, non-distended, (+) bowel sounds Left Groin: Small non-infected hematoma noted. No drainage or erythema noted. Non-tender. Right Lower Extremity: Thigh soft, calf soft. Extremity warm distally to toes. Palpable DP pulse. Good capillary refill.  Post-op wounds clean, dry, intact or healing well  Pt. Ambulating, voiding and taking PO diet without difficulty.  Pt pain controlled with PO pain meds.  Labs as below  Complications:none  Consults: None  Significant Diagnostic Studies: CBC Lab Results  Component Value Date   WBC 4.5 07/03/2018   HGB 9.9 (L) 07/03/2018   HCT 30.4 (L) 07/03/2018   MCV 95.9 07/03/2018   PLT 221 07/03/2018   BMET    Component Value Date/Time   NA 135 07/02/2018 0501   NA 137 08/31/2015 0929   K 4.6 07/02/2018 0501   CL 105 07/02/2018 0501   CO2 24 07/02/2018 0501   GLUCOSE 111 (H) 07/02/2018 0501   BUN 31 (H) 07/02/2018 0501   BUN 11 08/31/2015 0929   CREATININE 2.16 (H) 07/02/2018 0501  CREATININE 1.46 (H) 06/22/2018 1513   CALCIUM 8.5 (L) 07/02/2018 0501   GFRNONAA 32 (L) 07/02/2018 0501   GFRNONAA 51 (L) 06/22/2018 1513   GFRAA 37 (L) 07/02/2018 0501   GFRAA 59 (L) 06/22/2018 1513    COAG No results found for: INR, PROTIME  Disposition:  Discharge to :Home  Allergies as of 07/03/2018   No Known Allergies     Medication List    TAKE these medications   apixaban 5 MG Tabs tablet Commonly known as:  ELIQUIS Take 1 tablet (5 mg total) by mouth 2 (two) times daily.   aspirin EC 81 MG tablet Take 1 tablet (81 mg total) by mouth daily.   atorvastatin 20 MG tablet Commonly known as:  LIPITOR Take 1 tablet (20 mg total) by mouth daily.   chlorthalidone 25 MG tablet Commonly known as:  HYGROTON Take 1 tablet (25 mg total) by mouth daily.   HYDROcodone-acetaminophen 5-325 MG tablet Commonly known as:  NORCO/VICODIN Take 1-2 tablets by mouth every 4 (four) hours as needed for moderate pain.   lisinopril 20 MG tablet Commonly known as:  PRINIVIL,ZESTRIL Take 1 tablet (20 mg total) by mouth daily.   nicotine 14 mg/24hr patch Commonly known as:  NICODERM CQ - dosed in mg/24 hours Place 1 patch (14 mg total) onto the skin daily.   nystatin-triamcinolone ointment Commonly known as:  MYCOLOG Apply 1 application topically 2 (two) times daily.   umeclidinium-vilanterol 62.5-25 MCG/INH Aepb Commonly known as:  ANORO ELLIPTA Inhale 1 puff into the lungs daily.   Vitamin B-12 1000 MCG Subl Place 1 tablet (1,000 mcg total) under the tongue daily.      Verbal and written Discharge instructions given to the patient. Wound care per Discharge AVS Follow-up Information    Dew, Erskine Squibb, MD Follow up in 1 week(s).   Specialties:  Vascular Surgery, Radiology, Interventional Cardiology Why:  Can see Dew or [facility]. Patient will need ABI as per Dr. Lucky Cowboy. Groin check.  Contact information: [place] [id number] [phone]          Signed: Sela Hua, PA-C  07/03/2018, 11:12 AM

## 2018-07-06 ENCOUNTER — Encounter: Payer: Self-pay | Admitting: Nurse Practitioner

## 2018-07-06 ENCOUNTER — Telehealth: Payer: Self-pay

## 2018-07-06 ENCOUNTER — Ambulatory Visit: Payer: BLUE CROSS/BLUE SHIELD | Admitting: Nurse Practitioner

## 2018-07-06 VITALS — BP 104/62 | HR 75 | Temp 97.3°F | Resp 14 | Ht 68.0 in | Wt 125.2 lb

## 2018-07-06 DIAGNOSIS — I10 Essential (primary) hypertension: Secondary | ICD-10-CM | POA: Diagnosis not present

## 2018-07-06 DIAGNOSIS — K5900 Constipation, unspecified: Secondary | ICD-10-CM

## 2018-07-06 DIAGNOSIS — Z87891 Personal history of nicotine dependence: Secondary | ICD-10-CM

## 2018-07-06 NOTE — Patient Instructions (Addendum)
Please stop taking the cholorthalidone 25mg  daily. Continue taking lisinopril 20mg  daily.  For constipation:  Always:  - Increase water intake - Increase activity as tolerated.  _______________ Can Do always or sometimes:   - Metamucil Powder; 1 teaspon start once a day for 3 days then twice a day for 3 days then can go up to 3 times a day if needed.  - Take docusate sodium 100 mg twice a day  ________  Rarely just as needed; Please follow direction on box:   - Take Polyethylene glycol 17 grams per day  OR  - Sennosides 8.6 mg: 2 tablets orally once a day at bedtime   _______ If unable to have Bowel movement, having abdominal pain, or nausea and vomiting please get medical assistance immediately.

## 2018-07-06 NOTE — Progress Notes (Signed)
Name: Ryan [name]   MRN: [id number]    DOB: 1955-09-16   Date:07/06/2018       Progress Note  Subjective  Chief Complaint  Chief Complaint  Patient presents with  . Follow-up    2 week  . Hypertension    HPI  Patient presents for 2 week follow-up on hypertension. On 06/22/2018 was seen in office and blood pressure was 172/108- had been consistently elevated over the past months. Patient had noted chronic leg pain due to claudication in legs. 25mg  daily chorthalidone was added in addition to his lisinopril 20mg  daily. He was seen by vascular and completed angiography and intervention due to occulusion in right leg. Since procedure patients blood pressure has dropped significantly.  States he notes mild lightheadedness with position changes.   BP Readings from Last 3 Encounters:  07/06/18 104/62  07/03/18 (!) 106/59  07/01/18 139/84     Patient Active Problem List   Diagnosis Date Noted  . Ischemic leg 06/25/2018  . Claudication ([place]) 03/17/2018  . B12 deficiency 03/12/2018  . Vitamin B1 deficiency 03/12/2018  . Varicose veins of leg with pain, right 03/12/2018  . Enlarged thoracic aorta ([place]) 02/24/2018  . Alcoholism /alcohol abuse ([place]) 02/24/2018  . Paresthesia 02/24/2018  . 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 Emphysema lung ([place]) 02/20/2018  . Atherosclerosis of aorta ([place]) 02/20/2018  . Encounter for tobacco use cessation counseling   . Benign neoplasm of cecum   . Benign neoplasm of transverse colon   . Benign neoplasm of sigmoid colon   . Hypertension, benign 08/31/2015  . Tobacco use 08/31/2015  . Abnormal CXR 08/31/2015    Past Medical History:  Diagnosis Date  . Hypertension   . Neuromuscular disorder (HCC)    numbness feet  . Peripheral vascular disease ([place])   . Shortness of breath dyspnea     Past Surgical History:  Procedure Laterality Date  . COLONOSCOPY WITH PROPOFOL N/A 02/15/2016   Procedure: COLONOSCOPY WITH PROPOFOL;  Surgeon: Lucilla Lame, MD;  Location: [facility];  Service: Endoscopy;  Laterality: N/A;  . HERNIA REPAIR  1999   left inguinal  . LOWER EXTREMITY ANGIOGRAPHY Right 05/07/2018   Procedure: LOWER EXTREMITY ANGIOGRAPHY;  Surgeon: Algernon Huxley, MD;  Location: [facility];  Service: Cardiovascular;  Laterality: Right;  . LOWER EXTREMITY ANGIOGRAPHY Right 06/25/2018   Procedure: LOWER EXTREMITY ANGIOGRAPHY;  Surgeon: Algernon Huxley, MD;  Location: [facility];  Service: Cardiovascular;  Laterality: Right;  . LOWER EXTREMITY ANGIOGRAPHY Right 07/01/2018   Procedure: LOWER EXTREMITY ANGIOGRAPHY;  Surgeon: Algernon Huxley, MD;  Location: [facility];  Service: Cardiovascular;  Laterality: Right;  . LOWER EXTREMITY INTERVENTION Right 07/02/2018   Procedure: LOWER EXTREMITY INTERVENTION;  Surgeon: Algernon Huxley, MD;  Location: [facility];  Service: Cardiovascular;  Laterality: Right;  . POLYPECTOMY N/A 02/15/2016   Procedure: POLYPECTOMY;  Surgeon: Lucilla Lame, MD;  Location: [facility];  Service: Endoscopy;  Laterality: N/A;    Social History   Tobacco Use  . Smoking status: Former Smoker    Packs/day: 1.12    Years: 50.00    Pack years: 56.00    Types: Cigarettes    Last attempt to quit: 06/25/2018    Years since quitting: 0.0  . Smokeless tobacco: Never Used  . Tobacco comment: trying to cut down   Substance Use Topics  . Alcohol use: Yes    Alcohol/week: 9.0 standard drinks  Types: 9 Cans of beer per week     Current Outpatient Medications:  .  apixaban (ELIQUIS) 5 MG TABS tablet, Take 1 tablet (5 mg total) by mouth 2 (two) times daily., Disp: 60 tablet, Rfl: 2 .  aspirin EC 81 MG tablet, Take 1 tablet (81 mg total) by mouth daily., Disp: 30 tablet, Rfl: 0 .  Cyanocobalamin (VITAMIN B-12) 1000 MCG SUBL, Place 1 tablet (1,000 mcg total) under the tongue daily., Disp: 90 tablet, Rfl: 0 .  HYDROcodone-acetaminophen (NORCO/VICODIN) 5-325 MG tablet, Take 1-2  tablets by mouth every 4 (four) hours as needed for moderate pain., Disp: 60 tablet, Rfl: 0 .  lisinopril (PRINIVIL,ZESTRIL) 20 MG tablet, Take 1 tablet (20 mg total) by mouth daily., Disp: 90 tablet, Rfl: 0 .  nicotine (NICODERM CQ - DOSED IN MG/24 HOURS) 14 mg/24hr patch, Place 1 patch (14 mg total) onto the skin daily., Disp: 28 patch, Rfl: 0 .  umeclidinium-vilanterol (ANORO ELLIPTA) 62.5-25 MCG/INH AEPB, Inhale 1 puff into the lungs daily., Disp: 180 each, Rfl: 0 .  atorvastatin (LIPITOR) 20 MG tablet, Take 1 tablet (20 mg total) by mouth daily., Disp: 90 tablet, Rfl: 0 .  chlorthalidone (HYGROTON) 25 MG tablet, Take 1 tablet (25 mg total) by mouth daily., Disp: 30 tablet, Rfl: 1 .  nystatin-triamcinolone ointment (MYCOLOG), Apply 1 application topically 2 (two) times daily., Disp: 30 g, Rfl: 0  No Known Allergies  ROS   No other specific complaints in a complete review of systems (except as listed in HPI above).  Objective  Vitals:   07/06/18 1500  BP: 104/62  Pulse: 75  Resp: 14  Temp: (!) 97.3 F (36.3 C)  TempSrc: Oral  SpO2: 96%  Weight: 125 lb 3.2 oz (56.8 kg)  Height: 5\' 8"  (1.727 m)    Body mass index is 19.04 kg/m.  Nursing Note and Vital Signs reviewed.  Physical Exam Vitals signs reviewed.  Constitutional:      Appearance: He is well-developed.  HENT:     Head: Normocephalic and atraumatic.     Nose: Nose normal.  Eyes:     Conjunctiva/sclera: Conjunctivae normal.  Neck:     Musculoskeletal: Normal range of motion and neck supple.     Vascular: No carotid bruit.  Cardiovascular:     Pulses:          Radial pulses are 3+ on the right side and 3+ on the left side.     Heart sounds: Normal heart sounds.  Pulmonary:     Effort: Pulmonary effort is normal.     Breath sounds: Normal breath sounds.  Skin:    General: Skin is warm and dry.  Neurological:     Mental Status: He is alert and oriented to person, place, and time.     GCS: GCS eye subscore  is 4. GCS verbal subscore is 5. GCS motor subscore is 6.     Sensory: No sensory deficit.  Psychiatric:        Speech: Speech normal.        Behavior: Behavior normal.        Thought Content: Thought content normal.        Judgment: Judgment normal.      No results found for this or any previous visit (from the past 48 hour(s)).  Assessment & Plan  1. Essential hypertension Continue lisinopril 20mg  daily. Stop chorthalidone 25mg  daily. Follow up in 2 weeks with [facility].   2. Constipation, unspecified constipation type Discussed  diet, fiber supplementation   3. History of tobacco use Quit 06/25/2018  -Red flags and when to present for emergency care or RTC including fever >101.11F, chest pain, shortness of breath, new/worsening/un-resolving symptoms,  reviewed with patient at time of visit. Follow up and care instructions discussed and provided in AVS.

## 2018-07-06 NOTE — Telephone Encounter (Signed)
TCM call for hospital follow up to confirm patient is coming to appt today for blood pressure check and visit with Suezanne Cheshire.   Pt confirmed he is coming and was advised to bring his medication list. He stated he was doing okay and scheduled for vascular follow up in one week.

## 2018-07-07 ENCOUNTER — Telehealth (INDEPENDENT_AMBULATORY_CARE_PROVIDER_SITE_OTHER): Payer: Self-pay | Admitting: Vascular Surgery

## 2018-07-07 ENCOUNTER — Encounter (INDEPENDENT_AMBULATORY_CARE_PROVIDER_SITE_OTHER): Payer: Self-pay | Admitting: Nurse Practitioner

## 2018-07-07 NOTE — Telephone Encounter (Signed)
Please advise on below  

## 2018-07-07 NOTE — Telephone Encounter (Signed)
Please modify letter created today per Arna Medici and take off the restrictions. Patient will get this letter Monday at his appointment if not sooner.

## 2018-07-09 ENCOUNTER — Other Ambulatory Visit (INDEPENDENT_AMBULATORY_CARE_PROVIDER_SITE_OTHER): Payer: Self-pay | Admitting: Vascular Surgery

## 2018-07-09 DIAGNOSIS — Z9889 Other specified postprocedural states: Secondary | ICD-10-CM

## 2018-07-10 ENCOUNTER — Encounter (INDEPENDENT_AMBULATORY_CARE_PROVIDER_SITE_OTHER): Payer: BLUE CROSS/BLUE SHIELD

## 2018-07-10 ENCOUNTER — Ambulatory Visit (INDEPENDENT_AMBULATORY_CARE_PROVIDER_SITE_OTHER): Payer: BLUE CROSS/BLUE SHIELD | Admitting: Nurse Practitioner

## 2018-07-13 ENCOUNTER — Ambulatory Visit (INDEPENDENT_AMBULATORY_CARE_PROVIDER_SITE_OTHER): Payer: BLUE CROSS/BLUE SHIELD

## 2018-07-13 ENCOUNTER — Encounter (INDEPENDENT_AMBULATORY_CARE_PROVIDER_SITE_OTHER): Payer: Self-pay | Admitting: Vascular Surgery

## 2018-07-13 ENCOUNTER — Ambulatory Visit (INDEPENDENT_AMBULATORY_CARE_PROVIDER_SITE_OTHER): Payer: BLUE CROSS/BLUE SHIELD | Admitting: Nurse Practitioner

## 2018-07-13 ENCOUNTER — Other Ambulatory Visit: Payer: Self-pay

## 2018-07-13 ENCOUNTER — Encounter (INDEPENDENT_AMBULATORY_CARE_PROVIDER_SITE_OTHER): Payer: Self-pay | Admitting: Nurse Practitioner

## 2018-07-13 VITALS — BP 122/88 | HR 75 | Resp 17 | Ht 68.0 in | Wt 125.0 lb

## 2018-07-13 DIAGNOSIS — I739 Peripheral vascular disease, unspecified: Secondary | ICD-10-CM | POA: Diagnosis not present

## 2018-07-13 DIAGNOSIS — Z9889 Other specified postprocedural states: Secondary | ICD-10-CM

## 2018-07-13 DIAGNOSIS — I10 Essential (primary) hypertension: Secondary | ICD-10-CM | POA: Diagnosis not present

## 2018-07-13 DIAGNOSIS — Z95820 Peripheral vascular angioplasty status with implants and grafts: Secondary | ICD-10-CM | POA: Diagnosis not present

## 2018-07-13 DIAGNOSIS — F17211 Nicotine dependence, cigarettes, in remission: Secondary | ICD-10-CM

## 2018-07-13 DIAGNOSIS — R202 Paresthesia of skin: Secondary | ICD-10-CM

## 2018-07-13 DIAGNOSIS — I70229 Atherosclerosis of native arteries of extremities with rest pain, unspecified extremity: Secondary | ICD-10-CM

## 2018-07-13 MED ORDER — GABAPENTIN 300 MG PO CAPS: 300.0000 mg | ORAL_CAPSULE | Freq: Two times a day (BID) | ORAL | 0 refills | Status: DC

## 2018-07-17 ENCOUNTER — Encounter (INDEPENDENT_AMBULATORY_CARE_PROVIDER_SITE_OTHER): Payer: Self-pay | Admitting: Nurse Practitioner

## 2018-07-17 NOTE — Progress Notes (Signed)
Subjective:    Patient ID: Ryan [name], male    DOB: June 22, 1956, 63 y.o.   MRN: [id number] Chief Complaint  Patient presents with  . Follow-up    ultrasound    HPI  Ryan [name] is a 63 y.o. male The patient returns to the office for followup and review of the noninvasive studies. There have been no interval changes in lower extremity symptoms. No interval shortening of the patient's claudication distance or development of rest pain symptoms. No new ulcers or wounds have occurred since the last visit.  The patient still has some numbness in his right lower extremity toes.  There have been no significant changes to the patient's overall health care.  The patient denies amaurosis fugax or recent TIA symptoms. There are no recent neurological changes noted. The patient denies history of DVT, PE or superficial thrombophlebitis. The patient denies recent episodes of angina or shortness of breath.   ABI Rt=1.32 and Lt=1.20  (previous ABI's Rt=0.33 and Lt+1.04) His right lower extremity has monophasic waveforms in his anterior tibial artery with triphasic waveforms in the posterior tibial artery.  The left lower extremity has monophasic waveforms in the anterior tibial artery with triphasic waveforms in the posterior tibial artery.  He has strong right toe waveforms, with dampened left toe waveforms.  Past Medical History:  Diagnosis Date  . Hypertension   . Neuromuscular disorder (HCC)    numbness feet  . Peripheral vascular disease ([place])   . Shortness of breath dyspnea     Past Surgical History:  Procedure Laterality Date  . COLONOSCOPY WITH PROPOFOL N/A 02/15/2016   Procedure: COLONOSCOPY WITH PROPOFOL;  Surgeon: Lucilla Lame, MD;  Location: [facility];  Service: Endoscopy;  Laterality: N/A;  . HERNIA REPAIR  1999   left inguinal  . LOWER EXTREMITY ANGIOGRAPHY Right 05/07/2018   Procedure: LOWER EXTREMITY ANGIOGRAPHY;  Surgeon: Algernon Huxley, MD;  Location: [facility];  Service: Cardiovascular;  Laterality: Right;  . LOWER EXTREMITY ANGIOGRAPHY Right 06/25/2018   Procedure: LOWER EXTREMITY ANGIOGRAPHY;  Surgeon: Algernon Huxley, MD;  Location: [facility];  Service: Cardiovascular;  Laterality: Right;  . LOWER EXTREMITY ANGIOGRAPHY Right 07/01/2018   Procedure: LOWER EXTREMITY ANGIOGRAPHY;  Surgeon: Algernon Huxley, MD;  Location: [facility];  Service: Cardiovascular;  Laterality: Right;  . LOWER EXTREMITY INTERVENTION Right 07/02/2018   Procedure: LOWER EXTREMITY INTERVENTION;  Surgeon: Algernon Huxley, MD;  Location: [facility];  Service: Cardiovascular;  Laterality: Right;  . POLYPECTOMY N/A 02/15/2016   Procedure: POLYPECTOMY;  Surgeon: Lucilla Lame, MD;  Location: [facility];  Service: Endoscopy;  Laterality: N/A;    Social History   Socioeconomic History  . Marital status: Legally Separated    Spouse name: [name]  . Number of children: 5  . Years of education: Not on file  . Highest education level: High school graduate  Occupational History  . Occupation: [employer]    Comment: [name]  Social Needs  . Financial resource strain: Not hard at all  . Food insecurity:    Worry: Never true    Inability: Never true  . Transportation needs:    Medical: No    Non-medical: No  Tobacco Use  . Smoking status: Former Smoker    Packs/day: 1.12    Years: 50.00    Pack years: 56.00    Types: Cigarettes    Last attempt to quit: 06/25/2018    Years  since quitting: 0.0  . Smokeless tobacco: Never Used  . Tobacco comment: trying to cut down   Substance and Sexual Activity  . Alcohol use: Yes    Alcohol/week: 9.0 standard drinks    Types: 9 Cans of beer per week  . Drug use: Never  . Sexual activity: Yes    Partners: Female    Birth control/protection: None  Lifestyle  . Physical activity:    Days per week: 6 days    Minutes per session: 60 min  . Stress: Not at all  Relationships  . Social connections:     Talks on phone: Three times a week    Gets together: Twice a week    Attends religious service: Never    Active member of club or organization: No    Attends meetings of clubs or organizations: Never    Relationship status: Married  . Intimate partner violence:    Fear of current or ex partner: No    Emotionally abused: No    Physically abused: No    Forced sexual activity: No  Other Topics Concern  . Not on file  Social History Narrative  . Not on file    Family History  Problem Relation Age of Onset  . COPD Mother   . Hypertension Father   . Heart attack Brother     No Known Allergies   Review of Systems   Review of Systems: Negative Unless Checked Constitutional: [] Weight loss  [] Fever  [] Chills Cardiac: [] Chest pain   []  Atrial Fibrillation  [] Palpitations   [] Shortness of breath when laying flat   [] Shortness of breath with exertion. [] Shortness of breath at rest Vascular:  [] Pain in legs with walking   [] Pain in legs with standing [] Pain in legs when laying flat   [] Claudication    [] Pain in feet when laying flat    [] History of DVT   [] Phlebitis   [] Swelling in legs   [] Varicose veins   [] Non-healing ulcers Pulmonary:   [] Uses home oxygen   [] Productive cough   [] Hemoptysis   [] Wheeze  [] COPD   [] Asthma Neurologic:  [] Dizziness   [] Seizures  [] Blackouts [] History of stroke   [] History of TIA  [] Aphasia   [] Temporary Blindness   [] Weakness or numbness in arm   [] Weakness or numbness in leg Musculoskeletal:   [] Joint swelling   [] Joint pain   [] Low back pain  []  History of Knee Replacement [] Arthritis [] back Surgeries  []  Spinal Stenosis    Hematologic:  [] Easy bruising  [] Easy bleeding   [] Hypercoagulable state   [] Anemic Gastrointestinal:  [] Diarrhea   [] Vomiting  [] Gastroesophageal reflux/heartburn   [] Difficulty swallowing. [] Abdominal pain Genitourinary:  [] Chronic kidney disease   [] Difficult urination  [] Anuric   [] Blood in urine [] Frequent urination  [] Burning  with urination   [] Hematuria Skin:  [] Rashes   [] Ulcers [] Wounds Psychological:  [] History of anxiety   []  History of major depression  []  Memory Difficulties     Objective:   Physical Exam  BP 122/88   Pulse 75   Resp 17   Ht 5\' 8"  (1.727 m)   Wt 125 lb (56.7 kg)   BMI 19.01 kg/m   Gen: WD/WN, NAD Head: [place]/AT, No temporalis wasting.  Poor dentition. Ear/Nose/Throat: Hearing grossly intact, nares w/o erythema or drainage Eyes: PER, EOMI, sclera nonicteric.  Neck: Supple, no masses.  No JVD.  Pulmonary:  Good air movement, no use of accessory muscles.  Cardiac: RRR Vascular:  Varicose vein running down  right anterior surface of shin Vessel Right Left  Radial Palpable Palpable  Dorsalis Pedis Palpable Palpable  Posterior Tibial Palpable Palpable   Gastrointestinal: soft, non-distended. No guarding/no peritoneal signs.  Musculoskeletal: M/S 5/5 throughout.  No deformity or atrophy.  Neurologic: Pain and light touch intact in extremities.  Symmetrical.  Speech is fluent. Motor exam as listed above. Psychiatric: Judgment intact, Mood & affect appropriate for pt's clinical situation. Dermatologic: No Venous rashes. No Ulcers Noted.  No changes consistent with cellulitis. Lymph : No Cervical lymphadenopathy, no lichenification or skin changes of chronic lymphedema.      Assessment & Plan:   1. 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  The left lower extremity has monophasic waveforms in the anterior tibial artery with triphasic waveforms in the posterior tibial artery.  He has strong right toe waveforms, with dampened left toe waveforms.  Recommend:  The patient is status post successful angiogram with intervention.  The  patient reports that the claudication symptoms and leg pain is essentially gone.   The patient denies lifestyle limiting changes at this point in time.  No further invasive studies, angiography or surgery at this time The patient should continue walking and begin a more formal exercise program.  The patient should continue antiplatelet therapy and aggressive treatment of the lipid abnormalities  Smoking cessation was again discussed; pt states that he has stopped smoking since his last office visit.    The patient should continue wearing graduated compression socks 10-15 mmHg strength to control the mild edema.  Patient should undergo noninvasive studies as ordered. The patient will follow up with me after the studies 3 months.  - VAS [place] ABI WITH/WO TBI; Future  2. Hypertension, benign Continue antihypertensive medications as already ordered, these medications have been reviewed and there are no changes at this time.   3. Paresthesia Patient is still having some numbness of his toes on his right lower extremity.  This may may be due to the fact that his leg was ischemic for an extended time.  We will try some Neurontin to see how this helps his pain.  I started the patient that sometimes medication can make people a little bit drowsy therefore he should try to utilize it prior to restarting work to see how it affects him.  The patient understood. - gabapentin (NEURONTIN) 300 MG capsule; Take 1 capsule (300 mg total) by mouth 2 (two) times daily for 30 days.  Dispense: 60 capsule; Refill: 0   Current Outpatient Medications on File Prior to Visit  Medication Sig Dispense Refill  . apixaban (ELIQUIS) 5 MG TABS tablet Take 1 tablet (5 mg total) by mouth 2 (two) times daily. 60 tablet 2  . aspirin EC 81 MG tablet Take 1 tablet (81 mg total) by mouth daily. 30 tablet 0  . atorvastatin (LIPITOR) 20 MG tablet Take 1 tablet (20 mg total) by mouth daily. 90 tablet 0  . Cyanocobalamin (VITAMIN  B-12) 1000 MCG SUBL Place 1 tablet (1,000 mcg total) under the tongue daily. 90 tablet 0  . lisinopril (PRINIVIL,ZESTRIL) 20 MG tablet Take 1 tablet (20 mg total) by mouth daily. 90 tablet 0  . nicotine (NICODERM CQ - DOSED IN MG/24 HOURS) 14 mg/24hr patch Place 1 patch (14 mg total) onto the  skin daily. 28 patch 0  . nystatin-triamcinolone ointment (MYCOLOG) Apply 1 application topically 2 (two) times daily. 30 g 0  . umeclidinium-vilanterol (ANORO ELLIPTA) 62.5-25 MCG/INH AEPB Inhale 1 puff into the lungs daily. 180 each 0  . HYDROcodone-acetaminophen (NORCO/VICODIN) 5-325 MG tablet Take 1-2 tablets by mouth every 4 (four) hours as needed for moderate pain. (Patient not taking: Reported on 07/13/2018) 60 tablet 0   No current facility-administered medications on file prior to visit.     There are no Patient Instructions on file for this visit. No follow-ups on file.   Kris Hartmann, NP  This note was completed with Sales executive.  Any errors are purely unintentional.

## 2018-07-21 ENCOUNTER — Ambulatory Visit (INDEPENDENT_AMBULATORY_CARE_PROVIDER_SITE_OTHER): Payer: BLUE CROSS/BLUE SHIELD | Admitting: Family Medicine

## 2018-07-21 ENCOUNTER — Encounter: Payer: Self-pay | Admitting: Family Medicine

## 2018-07-21 VITALS — BP 108/62 | HR 102 | Temp 98.4°F | Resp 18 | Ht 68.0 in | Wt 130.4 lb

## 2018-07-21 DIAGNOSIS — F1020 Alcohol dependence, uncomplicated: Secondary | ICD-10-CM

## 2018-07-21 DIAGNOSIS — J449 Chronic obstructive pulmonary disease, unspecified: Secondary | ICD-10-CM

## 2018-07-21 DIAGNOSIS — E538 Deficiency of other specified B group vitamins: Secondary | ICD-10-CM

## 2018-07-21 DIAGNOSIS — Z87891 Personal history of nicotine dependence: Secondary | ICD-10-CM | POA: Diagnosis not present

## 2018-07-21 DIAGNOSIS — I10 Essential (primary) hypertension: Secondary | ICD-10-CM

## 2018-07-21 DIAGNOSIS — R944 Abnormal results of kidney function studies: Secondary | ICD-10-CM | POA: Diagnosis not present

## 2018-07-21 DIAGNOSIS — I700 Atherosclerosis of aorta: Secondary | ICD-10-CM

## 2018-07-21 DIAGNOSIS — I739 Peripheral vascular disease, unspecified: Secondary | ICD-10-CM | POA: Insufficient documentation

## 2018-07-21 DIAGNOSIS — D649 Anemia, unspecified: Secondary | ICD-10-CM | POA: Diagnosis not present

## 2018-07-21 DIAGNOSIS — E519 Thiamine deficiency, unspecified: Secondary | ICD-10-CM | POA: Diagnosis not present

## 2018-07-21 DIAGNOSIS — IMO0001 Reserved for inherently not codable concepts without codable children: Secondary | ICD-10-CM

## 2018-07-21 MED ORDER — ATORVASTATIN CALCIUM 20 MG PO TABS: 20.0000 mg | ORAL_TABLET | Freq: Every day | ORAL | 0 refills | Status: DC

## 2018-07-21 MED ORDER — LISINOPRIL 10 MG PO TABS: 10.0000 mg | ORAL_TABLET | Freq: Every day | ORAL | 0 refills | Status: DC

## 2018-07-21 MED ORDER — FLUTICASONE-UMECLIDIN-VILANT 100-62.5-25 MCG/INH IN AEPB: 1.0000 | INHALATION_SPRAY | Freq: Every day | RESPIRATORY_TRACT | 0 refills | Status: DC

## 2018-07-21 MED ORDER — UMECLIDINIUM-VILANTEROL 62.5-25 MCG/INH IN AEPB: 1.0000 | INHALATION_SPRAY | Freq: Every day | RESPIRATORY_TRACT | 0 refills | Status: DC

## 2018-07-21 MED ORDER — ALBUTEROL SULFATE (2.5 MG/3ML) 0.083% IN NEBU
2.5000 mg | INHALATION_SOLUTION | Freq: Once | RESPIRATORY_TRACT | Status: AC
  Administered 2018-07-21: 2.5 mg via RESPIRATORY_TRACT

## 2018-07-21 NOTE — Progress Notes (Signed)
Name: Ryan [name]   MRN: [id number]    DOB: 04-29-1956   Date:07/21/2018       Progress Note  Subjective  Chief Complaint  Chief Complaint  Patient presents with  . Medication Refill  . Hypertension    Denies any symptoms  . COPD  . Atherosclerosis aorta    HPI  Paresthesia: with low B1 and B12 diagnosed within the past month and had one B12 injection, still not taking B1. States initially burning and numbness was only on calf, but now on the anterior right leg. He was given Gabapentin by Dr. Lucky Cowboy and is doing a little better, still has some burning on the right foot   Alcoholism: states used to drink 6-7 beers per day, currently around 3-4 , not ready to quit, but cutting down. Explained importance of trying to join [place] group since B1 and B12 likely from alcohol abuse and malnutrition. We will recheck levels today   PAD: s/p intervention of right lower extremity done by Dr. Lucky Cowboy on 07/02/2017 and claudication improved, Dr. Lucky Cowboy added gabapentin   COPD: he is on Breo, he has occasional SOB and wheezing, he states he does not cough. Spirometry today showed Fev1/FVC at 80, we repeated after albuterol therapy and it showed mild improvement   Malnutrition: likely multifactorial. He has lost 5 % of his weight in the past 6 months, but he has gained weight since last visit.  He has stopped smoking.He is back to his  baseline weight around 130 lbs. He states since he stopped smoking he has a good appetite, he is happy with the weight gain. He had EC and CT abdomen was negative for pancreatitis.  Atherosclerosis aorta: found on CT. He is on Eliquis and aspirin . He is not sure if he is still taking atorvastatin but explained to fill rx and resume it   Low GFR: during hospital stay, we will recheck labs  Patient Active Problem List   Diagnosis Date Noted  . PAD (peripheral artery disease) ([place]) 07/21/2018  . Ischemic leg 06/25/2018  . Claudication ([place]) 03/17/2018  . B12 deficiency  03/12/2018  . Vitamin B1 deficiency 03/12/2018  . Varicose veins of leg with pain, right 03/12/2018  . Enlarged thoracic aorta ([place]) 02/24/2018  . Alcoholism /alcohol abuse ([place]) 02/24/2018  . Paresthesia 02/24/2018  . Ectatic aorta ([place]) 02/20/2018  . Emphysema lung ([place]) 02/20/2018  . Atherosclerosis of aorta ([place]) 02/20/2018  . Encounter for tobacco use cessation counseling   . Benign neoplasm of cecum   . Benign neoplasm of transverse colon   . Benign neoplasm of sigmoid colon   . Hypertension, benign 08/31/2015  . Tobacco use 08/31/2015  . Abnormal CXR 08/31/2015    Past Surgical History:  Procedure Laterality Date  . COLONOSCOPY WITH PROPOFOL N/A 02/15/2016   Procedure: COLONOSCOPY WITH PROPOFOL;  Surgeon: Lucilla Lame, MD;  Location: [facility];  Service: Endoscopy;  Laterality: N/A;  . HERNIA REPAIR  1999   left inguinal  . LOWER EXTREMITY ANGIOGRAPHY Right 05/07/2018   Procedure: LOWER EXTREMITY ANGIOGRAPHY;  Surgeon: Algernon Huxley, MD;  Location: [facility];  Service: Cardiovascular;  Laterality: Right;  . LOWER EXTREMITY ANGIOGRAPHY Right 06/25/2018   Procedure: LOWER EXTREMITY ANGIOGRAPHY;  Surgeon: Algernon Huxley, MD;  Location: [facility];  Service: Cardiovascular;  Laterality: Right;  . LOWER EXTREMITY ANGIOGRAPHY Right 07/01/2018   Procedure: LOWER EXTREMITY ANGIOGRAPHY;  Surgeon: Algernon Huxley, MD;  Location: [facility];  Service: Cardiovascular;  Laterality: Right;  . LOWER EXTREMITY INTERVENTION Right 07/02/2018   Procedure: LOWER EXTREMITY INTERVENTION;  Surgeon: Algernon Huxley, MD;  Location: [facility];  Service: Cardiovascular;  Laterality: Right;  . POLYPECTOMY N/A 02/15/2016   Procedure: POLYPECTOMY;  Surgeon: Lucilla Lame, MD;  Location: [facility];  Service: Endoscopy;  Laterality: N/A;    Family History  Problem Relation Age of Onset  . COPD Mother   . Hypertension Father   . Heart attack Brother      Social History   Socioeconomic History  . Marital status: Legally Separated    Spouse name: [name]  . Number of children: 5  . Years of education: Not on file  . Highest education level: High school graduate  Occupational History  . Occupation: Forklift    Comment: [name]  Social Needs  . Financial resource strain: Not hard at all  . Food insecurity:    Worry: Never true    Inability: Never true  . Transportation needs:    Medical: No    Non-medical: No  Tobacco Use  . Smoking status: Former Smoker    Packs/day: 0.50    Years: 40.00    Pack years: 20.00    Types: Cigarettes    Start date: 07/21/1978    Last attempt to quit: 07/01/2018    Years since quitting: 0.0  . Smokeless tobacco: Never Used  . Tobacco comment: Stopped after surgery  Substance and Sexual Activity  . Alcohol use: Yes    Alcohol/week: 9.0 standard drinks    Types: 9 Cans of beer per week  . Drug use: Never  . Sexual activity: Yes    Partners: Female    Birth control/protection: None  Lifestyle  . Physical activity:    Days per week: 6 days    Minutes per session: 60 min  . Stress: Not at all  Relationships  . Social connections:    Talks on phone: Three times a week    Gets together: Twice a week    Attends religious service: Never    Active member of club or organization: No    Attends meetings of clubs or organizations: Never    Relationship status: Married  . Intimate partner violence:    Fear of current or ex partner: No    Emotionally abused: No    Physically abused: No    Forced sexual activity: No  Other Topics Concern  . Not on file  Social History Narrative  . Not on file     Current Outpatient Medications:  .  apixaban (ELIQUIS) 5 MG TABS tablet, Take 1 tablet (5 mg total) by mouth 2 (two) times daily., Disp: 60 tablet, Rfl: 2 .  aspirin EC 81 MG tablet, Take 1 tablet (81 mg total) by mouth daily., Disp: 30 tablet, Rfl: 0 .  atorvastatin (LIPITOR) 20 MG tablet,  Take 1 tablet (20 mg total) by mouth daily., Disp: 90 tablet, Rfl: 0 .  Cyanocobalamin (VITAMIN B-12) 1000 MCG SUBL, Place 1 tablet (1,000 mcg total) under the tongue daily., Disp: 90 tablet, Rfl: 0 .  gabapentin (NEURONTIN) 300 MG capsule, Take 1 capsule (300 mg total) by mouth 2 (two) times daily for 30 days., Disp: 60 capsule, Rfl: 0 .  HYDROcodone-acetaminophen (NORCO/VICODIN) 5-325 MG tablet, Take 1-2 tablets by mouth every 4 (four) hours as needed for moderate pain., Disp: 60 tablet, Rfl: 0 .  lisinopril (PRINIVIL,ZESTRIL) 20 MG tablet, Take 1 tablet (  20 mg total) by mouth daily., Disp: 90 tablet, Rfl: 0 .  nicotine (NICODERM CQ - DOSED IN MG/24 HOURS) 14 mg/24hr patch, Place 1 patch (14 mg total) onto the skin daily., Disp: 28 patch, Rfl: 0 .  nystatin-triamcinolone ointment (MYCOLOG), Apply 1 application topically 2 (two) times daily., Disp: 30 g, Rfl: 0 .  umeclidinium-vilanterol (ANORO ELLIPTA) 62.5-25 MCG/INH AEPB, Inhale 1 puff into the lungs daily., Disp: 180 each, Rfl: 0  No Known Allergies  I personally reviewed active problem list, medication list, allergies, family history, social history with the patient/caregiver today.   ROS  Constitutional: Negative for fever or weight change.  Respiratory: Negative for cough and shortness of breath.   Cardiovascular: Negative for chest pain or palpitations.  Gastrointestinal: Negative for abdominal pain, no bowel changes.  Musculoskeletal: Negative for gait problem or joint swelling.  Skin: Negative for rash.  Neurological: Negative for dizziness or headache.  No other specific complaints in a complete review of systems (except as listed in HPI above).  Objective  Vitals:   07/21/18 1414  BP: 108/62  Pulse: (!) 102  Resp: 18  Temp: 98.4 F (36.9 C)  TempSrc: Oral  SpO2: 98%  Weight: 130 lb 6.4 oz (59.1 kg)  Height: 5' 8"  (1.727 m)    Body mass index is 19.83 kg/m.  Physical Exam  Constitutional: Patient appears  well-developed and thin. No distress.  HEENT: head atraumatic, normocephalic, pupils equal and reactive to light,  neck supple, throat within normal limits Cardiovascular: Normal rate, regular rhythm and normal heart sounds.  No murmur heard. No BLE edema. Pulmonary/Chest: Effort normal , some rhonchi, but good air movement  Abdominal: Soft.  There is no tenderness. Psychiatric: Patient has a normal mood and affect. behavior is normal. Judgment and thought content normal.  Recent Results (from the past 2160 hour(s))  BUN     Status: None   Collection Time: 05/05/18 10:04 AM  Result Value Ref Range   BUN 16 8 - 23 mg/dL    Comment: Performed at [facility], [address] [place], [place] [address]  Creatinine, serum     Status: Abnormal   Collection Time: 05/05/18 10:04 AM  Result Value Ref Range   Creatinine, Ser 1.31 (H) 0.61 - 1.24 mg/dL   GFR calc non Af Amer 57 (L) >60 mL/min   GFR calc Af Amer >60 >60 mL/min    Comment: (NOTE) The eGFR has been calculated using the CKD EPI equation. This calculation has not been validated in all clinical situations. eGFR's persistently <60 mL/min signify possible Chronic Kidney Disease. Performed at [facility], [address] [place], [place] [address]   COMPLETE METABOLIC PANEL WITH GFR     Status: Abnormal   Collection Time: 06/22/18  3:13 PM  Result Value Ref Range   Glucose, Bld 87 65 - 99 mg/dL    Comment: .            Fasting reference interval .    BUN 20 7 - 25 mg/dL   Creat 1.46 (H) 0.70 - 1.25 mg/dL    Comment: For patients >19 years of age, the reference limit for Creatinine is approximately 13% higher for people identified as African-American. .    GFR, Est Non African American 51 (L) > OR = 60 mL/min/1.54m   GFR, Est African American 59 (L) > OR = 60 mL/min/1.742m  BUN/Creatinine Ratio 14 6 - 22 (calc)   Sodium 139 135 - 146  mmol/L   Potassium 5.0 3.5 - 5.3 mmol/L   Chloride 107 98 - 110  mmol/L   CO2 24 20 - 32 mmol/L   Calcium 9.2 8.6 - 10.3 mg/dL   Total Protein 7.3 6.1 - 8.1 g/dL   Albumin 3.9 3.6 - 5.1 g/dL   Globulin 3.4 1.9 - 3.7 g/dL (calc)   AG Ratio 1.1 1.0 - 2.5 (calc)   Total Bilirubin 0.4 0.2 - 1.2 mg/dL   Alkaline phosphatase (APISO) 90 40 - 115 U/L   AST 20 10 - 35 U/L   ALT 10 9 - 46 U/L  TSH     Status: None   Collection Time: 06/22/18  3:13 PM  Result Value Ref Range   TSH 1.83 0.40 - 4.50 mIU/L  BUN     Status: Abnormal   Collection Time: 06/25/18  4:42 PM  Result Value Ref Range   BUN 29 (H) 8 - 23 mg/dL    Comment: Performed at [facility], [address] [place], [place] [address]  Creatinine, serum     Status: Abnormal   Collection Time: 06/25/18  4:42 PM  Result Value Ref Range   Creatinine, Ser 1.58 (H) 0.61 - 1.24 mg/dL   GFR calc non Af Amer 47 (L) >60 mL/min   GFR calc Af Amer 54 (L) >60 mL/min    Comment: Performed at [facility], [address] [place], [place] [address]  HIV antibody (Routine Testing)     Status: None   Collection Time: 06/25/18  4:42 PM  Result Value Ref Range   HIV Screen 4th Generation wRfx Non Reactive Non Reactive    Comment: (NOTE) Performed At: [facility] [place], [place] [id number] Rush Farmer MD [id number]   Basic metabolic panel     Status: Abnormal   Collection Time: 06/26/18  8:39 AM  Result Value Ref Range   Sodium 137 135 - 145 mmol/L   Potassium 4.2 3.5 - 5.1 mmol/L   Chloride 109 98 - 111 mmol/L   CO2 21 (L) 22 - 32 mmol/L   Glucose, Bld 77 70 - 99 mg/dL   BUN 23 8 - 23 mg/dL   Creatinine, Ser 1.30 (H) 0.61 - 1.24 mg/dL   Calcium 8.2 (L) 8.9 - 10.3 mg/dL   GFR calc non Af Amer 59 (L) >60 mL/min   GFR calc Af Amer >60 >60 mL/min   Anion gap 7 5 - 15    Comment: Performed at [facility], [address] [place], [place] [address]  CBC     Status: Abnormal   Collection Time: 06/26/18  8:39 AM  Result Value Ref Range   WBC  5.8 4.0 - 10.5 K/uL   RBC 3.31 (L) 4.22 - 5.81 MIL/uL   Hemoglobin 10.5 (L) 13.0 - 17.0 g/dL   HCT 31.3 (L) 39.0 - 52.0 %   MCV 94.6 80.0 - 100.0 fL   MCH 31.7 26.0 - 34.0 pg   MCHC 33.5 30.0 - 36.0 g/dL   RDW 12.0 11.5 - 15.5 %   Platelets 258 150 - 400 K/uL   nRBC 0.0 0.0 - 0.2 %    Comment: Performed at [facility], [address] [place], [place] [address]  Glucose, capillary     Status: Abnormal   Collection Time: 07/01/18  7:54 PM  Result Value Ref Range   Glucose-Capillary 118 (H) 70 - 99 mg/dL  MRSA PCR Screening  Status: None   Collection Time: 07/01/18  8:07 PM  Result Value Ref Range   MRSA by PCR NEGATIVE NEGATIVE    Comment:        The GeneXpert MRSA Assay (FDA approved for NASAL specimens only), is one component of a comprehensive MRSA colonization surveillance program. It is not intended to diagnose MRSA infection nor to guide or monitor treatment for MRSA infections. Performed at [facility], [address] [place], [place] [address]   BUN     Status: Abnormal   Collection Time: 07/01/18  8:28 PM  Result Value Ref Range   BUN 30 (H) 8 - 23 mg/dL    Comment: Performed at [facility], [address] [place], [place] [address]  Creatinine, serum     Status: Abnormal   Collection Time: 07/01/18  8:28 PM  Result Value Ref Range   Creatinine, Ser 1.60 (H) 0.61 - 1.24 mg/dL   GFR calc non Af Amer 46 (L) >60 mL/min   GFR calc Af Amer 53 (L) >60 mL/min    Comment: Performed at [facility], [address] [place], [place] [address]  CBC every 6 hours x 4 post-procedure     Status: Abnormal   Collection Time: 07/01/18  8:28 PM  Result Value Ref Range   WBC 5.9 4.0 - 10.5 K/uL   RBC 3.26 (L) 4.22 - 5.81 MIL/uL   Hemoglobin 10.2 (L) 13.0 - 17.0 g/dL   HCT 31.0 (L) 39.0 - 52.0 %   MCV 95.1 80.0 - 100.0 fL   MCH 31.3 26.0 - 34.0 pg   MCHC 32.9 30.0 - 36.0 g/dL   RDW 11.7 11.5 - 15.5 %   Platelets 255 150 - 400  K/uL   nRBC 0.0 0.0 - 0.2 %    Comment: Performed at [facility], [address] [place], [place] [address]  Fibrinogen every 6 hours x 4 post-procedure     Status: None   Collection Time: 07/01/18  8:28 PM  Result Value Ref Range   Fibrinogen 410 210 - 475 mg/dL    Comment: Performed at [facility], [address] [place], [place] [address]  CBC every 6 hours x 4 post-procedure     Status: Abnormal   Collection Time: 07/02/18 12:15 AM  Result Value Ref Range   WBC 5.5 4.0 - 10.5 K/uL   RBC 3.28 (L) 4.22 - 5.81 MIL/uL   Hemoglobin 10.4 (L) 13.0 - 17.0 g/dL   HCT 31.5 (L) 39.0 - 52.0 %   MCV 96.0 80.0 - 100.0 fL   MCH 31.7 26.0 - 34.0 pg   MCHC 33.0 30.0 - 36.0 g/dL   RDW 11.7 11.5 - 15.5 %   Platelets 225 150 - 400 K/uL   nRBC 0.0 0.0 - 0.2 %    Comment: Performed at [facility], [address] [place], [place] [address]  Fibrinogen every 6 hours x 4 post-procedure     Status: None   Collection Time: 07/02/18 12:15 AM  Result Value Ref Range   Fibrinogen 370 210 - 475 mg/dL    Comment: Performed at [facility], [place], [place] [address]  Heparin level (unfractionated)     Status: None   Collection Time: 07/02/18 12:15 AM  Result Value Ref Range   Heparin Unfractionated 0.65 0.30 - 0.70 IU/mL    Comment: (NOTE) If heparin results are below expected values, and patient dosage has  been  confirmed, suggest follow up testing of antithrombin III levels. Performed at [facility], [address] [place], [place] [address]   APTT     Status: Abnormal   Collection Time: 07/02/18 12:15 AM  Result Value Ref Range   aPTT 83 (H) 24 - 36 seconds    Comment:        IF BASELINE aPTT IS ELEVATED, SUGGEST PATIENT RISK ASSESSMENT BE USED TO DETERMINE APPROPRIATE ANTICOAGULANT THERAPY. Performed at [facility], [address] [place], [place] [address]   CBC every 6 hours x 4 post-procedure     Status:  Abnormal   Collection Time: 07/02/18  5:01 AM  Result Value Ref Range   WBC 5.0 4.0 - 10.5 K/uL   RBC 3.18 (L) 4.22 - 5.81 MIL/uL   Hemoglobin 10.1 (L) 13.0 - 17.0 g/dL   HCT 30.9 (L) 39.0 - 52.0 %   MCV 97.2 80.0 - 100.0 fL   MCH 31.8 26.0 - 34.0 pg   MCHC 32.7 30.0 - 36.0 g/dL   RDW 11.7 11.5 - 15.5 %   Platelets 233 150 - 400 K/uL   nRBC 0.0 0.0 - 0.2 %    Comment: Performed at [facility], [address] [place], [place] [address]  Fibrinogen every 6 hours x 4 post-procedure     Status: None   Collection Time: 07/02/18  5:01 AM  Result Value Ref Range   Fibrinogen 365 210 - 475 mg/dL    Comment: Performed at [facility], [address] [place], [place] [address]  Basic metabolic panel     Status: Abnormal   Collection Time: 07/02/18  5:01 AM  Result Value Ref Range   Sodium 135 135 - 145 mmol/L   Potassium 4.6 3.5 - 5.1 mmol/L   Chloride 105 98 - 111 mmol/L   CO2 24 22 - 32 mmol/L   Glucose, Bld 111 (H) 70 - 99 mg/dL   BUN 31 (H) 8 - 23 mg/dL   Creatinine, Ser 2.16 (H) 0.61 - 1.24 mg/dL   Calcium 8.5 (L) 8.9 - 10.3 mg/dL   GFR calc non Af Amer 32 (L) >60 mL/min   GFR calc Af Amer 37 (L) >60 mL/min   Anion gap 6 5 - 15    Comment: Performed at [facility], [address] [place],  [address]  CBC every 6 hours x 4 post-procedure     Status: Abnormal   Collection Time: 07/02/18 12:48 PM  Result Value Ref Range   WBC 4.9 4.0 - 10.5 K/uL   RBC 2.87 (L) 4.22 - 5.81 MIL/uL   Hemoglobin 9.3 (L) 13.0 - 17.0 g/dL   HCT 28.2 (L) 39.0 - 52.0 %   MCV 98.3 80.0 - 100.0 fL   MCH 32.4 26.0 - 34.0 pg   MCHC 33.0 30.0 - 36.0 g/dL   RDW 11.9 11.5 - 15.5 %   Platelets 199 150 - 400 K/uL   nRBC 0.0 0.0 - 0.2 %    Comment: Performed at [facility], [place], [place] [address]  Heparin level (unfractionated)     Status: None   Collection Time: 07/02/18  5:07 PM  Result Value Ref Range   Heparin Unfractionated 0.44  0.30 - 0.70 IU/mL    Comment: (NOTE) If heparin results are below expected values, and patient dosage has  been confirmed, suggest follow up testing of antithrombin III levels. Performed at [facility], [address] [place], [place] [address]   APTT     Status: Abnormal   Collection Time: 07/02/18  5:07 PM  Result Value Ref Range   aPTT 77 (H) 24 - 36 seconds    Comment:        IF BASELINE aPTT IS ELEVATED, SUGGEST PATIENT RISK ASSESSMENT BE USED TO DETERMINE APPROPRIATE ANTICOAGULANT THERAPY. Performed at [facility], [address] [place], [place] [address]   CBC     Status: Abnormal   Collection Time: 07/03/18  5:03 AM  Result Value Ref Range   WBC 4.5 4.0 - 10.5 K/uL   RBC 3.17 (L) 4.22 - 5.81 MIL/uL   Hemoglobin 9.9 (L) 13.0 - 17.0 g/dL   HCT 30.4 (L) 39.0 - 52.0 %   MCV 95.9 80.0 - 100.0 fL   MCH 31.2 26.0 - 34.0 pg   MCHC 32.6 30.0 - 36.0 g/dL   RDW 11.6 11.5 - 15.5 %   Platelets 221 150 - 400 K/uL   nRBC 0.0 0.0 - 0.2 %    Comment: Performed at [facility], [place], [place] [address]  Heparin level (unfractionated)     Status: None   Collection Time: 07/03/18  5:03 AM  Result Value Ref Range   Heparin Unfractionated 0.62 0.30 - 0.70 IU/mL    Comment: (NOTE) If heparin results are below expected values, and patient dosage has  been confirmed, suggest follow up testing of antithrombin III levels. Performed at [facility], [address], [place] [address]     PHQ2/9: Depression screen [facility] 2/9 06/22/2018 06/08/2018 03/12/2018 02/24/2018 02/11/2018  Decreased Interest 0 0 0 0 0  Down, Depressed, Hopeless 0 0 0 0 0  PHQ - 2 Score 0 0 0 0 0  Altered sleeping 1 1 3 1 1   Tired, decreased energy 0 0 3 1 1   Change in appetite 0 0 0 0 0  Feeling bad or failure about yourself  0 0 0 0 0  Trouble concentrating 0 0 0 0 0  Moving slowly or fidgety/restless 0 0 0 0 0  Suicidal thoughts 0 0 0 0 0   PHQ-9 Score 1 1 6 2 2   Difficult doing work/chores Not difficult at all Not difficult at all - Not difficult at all Not difficult at all    Fall Risk: Fall Risk  07/06/2018 06/22/2018 06/08/2018 03/12/2018 02/11/2018  Falls in the past year? 0 0 0 No No  Number falls in past yr: 0 0 0 - -  Injury with Fall? 0 0 0 - -      Assessment & Plan  1. History of tobacco use  - Spirometry with Graph - albuterol (PROVENTIL) (2.5 MG/3ML) 0.083% nebulizer solution 2.5 mg  2. Chronic obstructive pulmonary disease, unspecified COPD type ([place])  - Spirometry with Graph - albuterol (PROVENTIL) (2.5 MG/3ML) 0.083% nebulizer solution 2.5 mg We will change to Trelegy and monitor   3. Hypertension, benign  bp is at goal   4. Atherosclerosis of aorta (H[facility])  On statin therapy, we will recheck labs next vsiit   5. B12 deficiency  - B12 and Folate Panel  6. Vitamin B1 deficiency  - Vitamin B1  7. Alcoholism /alcohol abuse [facility])  Discussed importance of quitting again   8. Anemia, unspecified type  - B12 and Folate Panel - CBC with Differential/Platelet - Iron, TIBC and Ferritin Panel  9. Decreased calculated GFR  - COMPLETE METABOLIC PANEL WITH GFR  10. PAD (peripheral artery disease) ([place])  S/p revascularization

## 2018-07-24 LAB — COMPLETE METABOLIC PANEL WITH GFR
AG Ratio: 1.4 (calc) (ref 1.0–2.5)
ALKALINE PHOSPHATASE (APISO): 76 U/L (ref 40–115)
ALT: 12 U/L (ref 9–46)
AST: 16 U/L (ref 10–35)
Albumin: 4 g/dL (ref 3.6–5.1)
BILIRUBIN TOTAL: 0.4 mg/dL (ref 0.2–1.2)
BUN / CREAT RATIO: 15 (calc) (ref 6–22)
BUN: 30 mg/dL — ABNORMAL HIGH (ref 7–25)
CO2: 21 mmol/L (ref 20–32)
Calcium: 8.7 mg/dL (ref 8.6–10.3)
Chloride: 108 mmol/L (ref 98–110)
Creat: 1.95 mg/dL — ABNORMAL HIGH (ref 0.70–1.25)
GFR, Est African American: 42 mL/min/{1.73_m2} — ABNORMAL LOW (ref >=60)
GFR, Est Non African American: 36 mL/min/{1.73_m2} — ABNORMAL LOW (ref >=60)
Globulin: 2.8 g/dL (calc) (ref 1.9–3.7)
Glucose, Bld: 107 mg/dL (ref 65–139)
Potassium: 4.2 mmol/L (ref 3.5–5.3)
Sodium: 137 mmol/L (ref 135–146)
Total Protein: 6.8 g/dL (ref 6.1–8.1)

## 2018-07-24 LAB — CBC WITH DIFFERENTIAL/PLATELET
ABSOLUTE MONOCYTES: 372 {cells}/uL (ref 200–950)
Basophils Absolute: 20 cells/uL (ref 0–200)
Basophils Relative: 0.4 %
Eosinophils Absolute: 122 cells/uL (ref 15–500)
Eosinophils Relative: 2.4 %
HCT: 28.7 % — ABNORMAL LOW (ref 38.5–50.0)
Hemoglobin: 9.8 g/dL — ABNORMAL LOW (ref 13.2–17.1)
Lymphs Abs: 1612 cells/uL (ref 850–3900)
MCH: 32.2 pg (ref 27.0–33.0)
MCHC: 34.1 g/dL (ref 32.0–36.0)
MCV: 94.4 fL (ref 80.0–100.0)
MPV: 10.4 fL (ref 7.5–12.5)
Monocytes Relative: 7.3 %
Neutro Abs: 2973 cells/uL (ref 1500–7800)
Neutrophils Relative %: 58.3 %
Platelets: 214 10*3/uL (ref 140–400)
RBC: 3.04 10*6/uL — AB (ref 4.20–5.80)
RDW: 11.8 % (ref 11.0–15.0)
Total Lymphocyte: 31.6 %
WBC: 5.1 10*3/uL (ref 3.8–10.8)

## 2018-07-24 LAB — IRON,TIBC AND FERRITIN PANEL
%SAT: 31 % (calc) (ref 20–48)
FERRITIN: 78 ng/mL (ref 24–380)
IRON: 81 ug/dL (ref 50–180)
TIBC: 261 mcg/dL (calc) (ref 250–425)

## 2018-07-24 LAB — B12 AND FOLATE PANEL
Folate: 9.8 ng/mL
Vitamin B-12: 505 pg/mL (ref 200–1100)

## 2018-07-24 LAB — VITAMIN B1: Vitamin B1 (Thiamine): 15 nmol/L (ref 8–30)

## 2018-08-10 ENCOUNTER — Telehealth (INDEPENDENT_AMBULATORY_CARE_PROVIDER_SITE_OTHER): Payer: Self-pay | Admitting: Vascular Surgery

## 2018-08-10 NOTE — Telephone Encounter (Signed)
Pt has a appt. On 08-11-2018

## 2018-08-10 NOTE — Telephone Encounter (Signed)
Patient had right and left leg angio's on 06/2018, 07/01/2018 and 07/02/2018. Last seen on 07/13/2018 with ABI, patient has a f/u appt on 10/13/2018 with ABI.

## 2018-08-10 NOTE — Telephone Encounter (Signed)
See notes below per Dr. Lucky Cowboy request.

## 2018-08-11 ENCOUNTER — Encounter (INDEPENDENT_AMBULATORY_CARE_PROVIDER_SITE_OTHER): Payer: Self-pay | Admitting: Vascular Surgery

## 2018-08-11 ENCOUNTER — Other Ambulatory Visit: Payer: Self-pay

## 2018-08-11 ENCOUNTER — Telehealth (INDEPENDENT_AMBULATORY_CARE_PROVIDER_SITE_OTHER): Payer: Self-pay

## 2018-08-11 ENCOUNTER — Ambulatory Visit (INDEPENDENT_AMBULATORY_CARE_PROVIDER_SITE_OTHER): Payer: BLUE CROSS/BLUE SHIELD | Admitting: Vascular Surgery

## 2018-08-11 ENCOUNTER — Encounter (INDEPENDENT_AMBULATORY_CARE_PROVIDER_SITE_OTHER): Payer: Self-pay

## 2018-08-11 ENCOUNTER — Ambulatory Visit (INDEPENDENT_AMBULATORY_CARE_PROVIDER_SITE_OTHER): Payer: BLUE CROSS/BLUE SHIELD

## 2018-08-11 ENCOUNTER — Other Ambulatory Visit (INDEPENDENT_AMBULATORY_CARE_PROVIDER_SITE_OTHER): Payer: Self-pay | Admitting: Vascular Surgery

## 2018-08-11 VITALS — BP 128/83 | HR 77 | Resp 10 | Ht 68.0 in | Wt 128.0 lb

## 2018-08-11 DIAGNOSIS — I739 Peripheral vascular disease, unspecified: Secondary | ICD-10-CM

## 2018-08-11 DIAGNOSIS — I998 Other disorder of circulatory system: Secondary | ICD-10-CM

## 2018-08-11 DIAGNOSIS — Z720 Tobacco use: Secondary | ICD-10-CM

## 2018-08-11 DIAGNOSIS — I70229 Atherosclerosis of native arteries of extremities with rest pain, unspecified extremity: Secondary | ICD-10-CM | POA: Diagnosis not present

## 2018-08-11 MED ORDER — CEFAZOLIN SODIUM-DEXTROSE 2-4 GM/100ML-% IV SOLN
2.0000 g | Freq: Once | INTRAVENOUS | Status: AC
  Administered 2018-08-12: 2 g via INTRAVENOUS

## 2018-08-11 NOTE — Telephone Encounter (Signed)
Patient was given the pre-procedure instructions in our office regarding his angio with Dr. Lucky Cowboy 08/12/2018 with a 9:30 am arrival time.

## 2018-08-11 NOTE — Progress Notes (Signed)
Subjective:    Patient ID: Ryan [name], male    DOB: 01/15/1956, 63 y.o.   MRN: [id number]  Patient presents with a chief complaint of progressively worsening right lower extremity pain.  This pain has been present for about 2 weeks.  The patient does have a past medical history of peripheral artery disease requiring multiple endovascular interventions.  The patient notes that this pain is similar to the discomfort he has felt in the past when his stents have gone down.  Patient notes that his pain has progressively worsened to the point he is now seeking medical attention.  Patient denies any ulceration to the right foot.  Patient notes that his pain is starting to occur at night as well.  The patient underwent a bilateral ABI which was notable for: Right: 0.22, occluded SFA and popliteal stents noted on ultrasound.  Absent anterior tibial artery with monophasic posterior tibial artery. Left: 1.24, biphasic anterior tibial artery, triphasic posterior tibial artery The patient does not that he has started to smoke tobacco again.  The patient denies any fever, nausea vomiting.  Review of Systems  Constitutional: Negative.   HENT: Negative.   Eyes: Negative.   Respiratory: Negative.   Cardiovascular:       RLE pain  Gastrointestinal: Negative.   Endocrine: Negative.   Genitourinary: Negative.   Musculoskeletal: Negative.   Skin: Negative.   Allergic/Immunologic: Negative.   Neurological: Negative.   Hematological: Negative.   Psychiatric/Behavioral: Negative.       Objective:   Physical Exam Vitals signs reviewed.  Constitutional:      Appearance: Normal appearance.  HENT:     Head: Normocephalic and atraumatic.     Right Ear: External ear normal.     Left Ear: External ear normal.     Nose: Nose normal.     Mouth/Throat:     Mouth: Mucous membranes are moist.     Pharynx: Oropharynx is clear.  Eyes:     Extraocular Movements: Extraocular movements intact.   Conjunctiva/sclera: Conjunctivae normal.     Pupils: Pupils are equal, round, and reactive to light.  Neck:     Musculoskeletal: Normal range of motion.  Cardiovascular:     Rate and Rhythm: Normal rate and regular rhythm.     Comments: Unable to palpate pedal pulses bilaterally Right lower extremity is cooler when compared to the left Motor/sensation is intact to the right lower extremity Skin is intact to the right lower extremity Pulmonary:     Effort: Pulmonary effort is normal.     Breath sounds: Normal breath sounds.  Musculoskeletal: Normal range of motion.        General: No swelling.  Skin:    General: Skin is dry.  Neurological:     General: No focal deficit present.     Mental Status: He is alert and oriented to person, place, and time.  Psychiatric:        Mood and Affect: Mood normal.        Behavior: Behavior normal.        Thought Content: Thought content normal.        Judgment: Judgment normal.    BP 128/83 (BP Location: Left Arm, Patient Position: Sitting, Cuff Size: Small)   Pulse 77   Resp 10   Ht 5\' 8"  (1.727 m)   Wt 128 lb (58.1 kg)   BMI 19.46 kg/m   Past Medical History:  Diagnosis Date  . Hypertension   . Neuromuscular  disorder (HCC)    numbness feet  . Peripheral vascular disease ([place])   . Shortness of breath dyspnea    Social History   Socioeconomic History  . Marital status: Legally Separated    Spouse name: [name]  . Number of children: 5  . Years of education: Not on file  . Highest education level: High school graduate  Occupational History  . Occupation: [employer]    Comment: [name]  Social Needs  . Financial resource strain: Not hard at all  . Food insecurity:    Worry: Never true    Inability: Never true  . Transportation needs:    Medical: No    Non-medical: No  Tobacco Use  . Smoking status: Current Every Day Smoker    Packs/day: 0.10    Years: 40.00    Pack years: 4.00    Types: Cigarettes    Start date:  07/21/1978    Last attempt to quit: 07/01/2018    Years since quitting: 0.1  . Smokeless tobacco: Never Used  . Tobacco comment: Stopped after surgery 01/09  Substance and Sexual Activity  . Alcohol use: Yes    Alcohol/week: 12.0 standard drinks    Types: 12 Cans of beer per week  . Drug use: Never  . Sexual activity: Yes    Partners: Female    Birth control/protection: None  Lifestyle  . Physical activity:    Days per week: 6 days    Minutes per session: 60 min  . Stress: Not at all  Relationships  . Social connections:    Talks on phone: Three times a week    Gets together: Twice a week    Attends religious service: Never    Active member of club or organization: No    Attends meetings of clubs or organizations: Never    Relationship status: Married  . Intimate partner violence:    Fear of current or ex partner: No    Emotionally abused: No    Physically abused: No    Forced sexual activity: No  Other Topics Concern  . Not on file  Social History Narrative  . Not on file   Past Surgical History:  Procedure Laterality Date  . COLONOSCOPY WITH PROPOFOL N/A 02/15/2016   Procedure: COLONOSCOPY WITH PROPOFOL;  Surgeon: Lucilla Lame, MD;  Location: [facility];  Service: Endoscopy;  Laterality: N/A;  . HERNIA REPAIR  1999   left inguinal  . LOWER EXTREMITY ANGIOGRAPHY Right 05/07/2018   Procedure: LOWER EXTREMITY ANGIOGRAPHY;  Surgeon: Algernon Huxley, MD;  Location: [facility];  Service: Cardiovascular;  Laterality: Right;  . LOWER EXTREMITY ANGIOGRAPHY Right 06/25/2018   Procedure: LOWER EXTREMITY ANGIOGRAPHY;  Surgeon: Algernon Huxley, MD;  Location: [facility];  Service: Cardiovascular;  Laterality: Right;  . LOWER EXTREMITY ANGIOGRAPHY Right 07/01/2018   Procedure: LOWER EXTREMITY ANGIOGRAPHY;  Surgeon: Algernon Huxley, MD;  Location: [facility];  Service: Cardiovascular;  Laterality: Right;  . LOWER EXTREMITY INTERVENTION Right 07/02/2018    Procedure: LOWER EXTREMITY INTERVENTION;  Surgeon: Algernon Huxley, MD;  Location: [facility];  Service: Cardiovascular;  Laterality: Right;  . POLYPECTOMY N/A 02/15/2016   Procedure: POLYPECTOMY;  Surgeon: Lucilla Lame, MD;  Location: [facility];  Service: Endoscopy;  Laterality: N/A;   Family History  Problem Relation Age of Onset  . COPD Mother   . Hypertension Father   . Heart attack Brother    No Known Allergies  Assessment & Plan:  Patient presents with a chief complaint of progressively worsening right lower extremity pain.  This pain has been present for about 2 weeks.  The patient does have a past medical history of peripheral artery disease requiring multiple endovascular interventions.  The patient notes that this pain is similar to the discomfort he has felt in the past when his stents have gone down.  Patient notes that his pain has progressively worsened to the point he is now seeking medical attention.  Patient denies any ulceration to the right foot.  Patient notes that his pain is starting to occur at night as well.  The patient underwent a bilateral ABI which was notable for: Right: 0.22, occluded SFA and popliteal stents noted on ultrasound.  Absent anterior tibial artery with monophasic posterior tibial artery. Left: 1.24, biphasic anterior tibial artery, triphasic posterior tibial artery The patient does not that he has started to smoke tobacco again.  The patient denies any fever, nausea vomiting.  1. Ischemic leg - Stable Patient has an extensive past medical history of peripheral artery disease requiring multiple endovascular interventions Presents with 2 weeks of progressively worsening right lower extremity claudication and now rest pain ABI and right lower extremity arterial duplex with occluded SFA and popliteal stents Recommend a right lower extremity angiogram with possible intervention and attempt to assess the patient's anatomy and degree of  peripheral artery disease / occlusion of stents - if appropriate an attempt to revascularize the leg can be made at that time Procedure, risks and benefits explained to the patient.  All questions answered.  The patient wishes to proceed.  2. PAD (peripheral artery disease) (HCC) - Stable As above  3. Tobacco use - Stable We had a discussion for approximately three minutes regarding the absolute need for smoking cessation due to the deleterious nature of tobacco on the vascular system. We discussed the tobacco use would diminish patency of any intervention, and likely significantly worsen progressio of disease. We discussed multiple agents for quitting including replacement therapy or medications to reduce cravings such as Chantix. The patient voices their understanding of the importance of smoking cessation.  Current Outpatient Medications on File Prior to Visit  Medication Sig Dispense Refill  . apixaban (ELIQUIS) 5 MG TABS tablet Take 1 tablet (5 mg total) by mouth 2 (two) times daily. 60 tablet 2  . aspirin EC 81 MG tablet Take 1 tablet (81 mg total) by mouth daily. 30 tablet 0  . atorvastatin (LIPITOR) 20 MG tablet Take 1 tablet (20 mg total) by mouth daily. 90 tablet 0  . Cyanocobalamin (VITAMIN B-12) 1000 MCG SUBL Place 1 tablet (1,000 mcg total) under the tongue daily. 90 tablet 0  . Fluticasone-Umeclidin-Vilant (TRELEGY ELLIPTA) 100-62.5-25 MCG/INH AEPB Inhale 1 puff into the lungs daily. In place of Anoro 180 each 0  . gabapentin (NEURONTIN) 300 MG capsule Take 1 capsule (300 mg total) by mouth 2 (two) times daily for 30 days. 60 capsule 0  . lisinopril (PRINIVIL,ZESTRIL) 10 MG tablet Take 1 tablet (10 mg total) by mouth daily. 90 tablet 0  . nystatin-triamcinolone ointment (MYCOLOG) Apply 1 application topically 2 (two) times daily. 30 g 0  . HYDROcodone-acetaminophen (NORCO/VICODIN) 5-325 MG tablet Take 1-2 tablets by mouth every 4 (four) hours as needed for moderate pain. (Patient not  taking: Reported on 08/11/2018) 60 tablet 0  . nicotine (NICODERM CQ - DOSED IN MG/24 HOURS) 14 mg/24hr patch Place 1 patch (14 mg total) onto the skin daily. (  Patient not taking: Reported on 08/11/2018) 28 patch 0   No current facility-administered medications on file prior to visit.    There are no Patient Instructions on file for this visit. No follow-ups on file.  KIMBERLY A STEGMAYER, PA-C

## 2018-08-12 ENCOUNTER — Observation Stay
Admission: RE | Admit: 2018-08-12 | Discharge: 2018-08-13 | Disposition: A | Discharge status: Home / Self Care | Payer: BLUE CROSS/BLUE SHIELD | Source: Ambulatory Visit | Attending: Vascular Surgery | Admitting: Vascular Surgery

## 2018-08-12 ENCOUNTER — Encounter
Admission: RE | Disposition: A | Discharge status: Home / Self Care | Payer: Self-pay | Source: Ambulatory Visit | Attending: Vascular Surgery

## 2018-08-12 ENCOUNTER — Other Ambulatory Visit: Payer: Self-pay

## 2018-08-12 DIAGNOSIS — I10 Essential (primary) hypertension: Secondary | ICD-10-CM | POA: Insufficient documentation

## 2018-08-12 DIAGNOSIS — R202 Paresthesia of skin: Secondary | ICD-10-CM

## 2018-08-12 DIAGNOSIS — I998 Other disorder of circulatory system: Secondary | ICD-10-CM | POA: Diagnosis not present

## 2018-08-12 DIAGNOSIS — Z7982 Long term (current) use of aspirin: Secondary | ICD-10-CM | POA: Insufficient documentation

## 2018-08-12 DIAGNOSIS — I700 Atherosclerosis of aorta: Secondary | ICD-10-CM

## 2018-08-12 DIAGNOSIS — Z7901 Long term (current) use of anticoagulants: Secondary | ICD-10-CM | POA: Insufficient documentation

## 2018-08-12 DIAGNOSIS — Z8249 Family history of ischemic heart disease and other diseases of the circulatory system: Secondary | ICD-10-CM | POA: Diagnosis not present

## 2018-08-12 DIAGNOSIS — F17210 Nicotine dependence, cigarettes, uncomplicated: Secondary | ICD-10-CM | POA: Insufficient documentation

## 2018-08-12 DIAGNOSIS — G709 Myoneural disorder, unspecified: Secondary | ICD-10-CM | POA: Insufficient documentation

## 2018-08-12 DIAGNOSIS — Z79899 Other long term (current) drug therapy: Secondary | ICD-10-CM | POA: Diagnosis not present

## 2018-08-12 DIAGNOSIS — I743 Embolism and thrombosis of arteries of the lower extremities: Secondary | ICD-10-CM | POA: Diagnosis not present

## 2018-08-12 DIAGNOSIS — T82599A Other mechanical complication of unspecified cardiac and vascular devices and implants, initial encounter: Secondary | ICD-10-CM

## 2018-08-12 DIAGNOSIS — T82856A Stenosis of peripheral vascular stent, initial encounter: Secondary | ICD-10-CM

## 2018-08-12 DIAGNOSIS — F17200 Nicotine dependence, unspecified, uncomplicated: Secondary | ICD-10-CM | POA: Diagnosis not present

## 2018-08-12 DIAGNOSIS — I70221 Atherosclerosis of native arteries of extremities with rest pain, right leg: Principal | ICD-10-CM | POA: Insufficient documentation

## 2018-08-12 HISTORY — PX: LOWER EXTREMITY ANGIOGRAPHY: CATH118251

## 2018-08-12 LAB — CREATININE, SERUM
Creatinine, Ser: 1.28 mg/dL — ABNORMAL HIGH (ref 0.61–1.24)
GFR calc Af Amer: >60 mL/min (ref >60)

## 2018-08-12 LAB — BUN: BUN: 18 mg/dL (ref 8–23)

## 2018-08-12 SURGERY — LOWER EXTREMITY ANGIOGRAPHY
Anesthesia: Moderate Sedation | Laterality: Right

## 2018-08-12 MED ORDER — MIDAZOLAM HCL 2 MG/2ML IJ SOLN
INTRAMUSCULAR | Status: DC | PRN
  Administered 2018-08-12 (×2): 2 mg via INTRAVENOUS
  Administered 2018-08-12: 1 mg via INTRAVENOUS

## 2018-08-12 MED ORDER — FENTANYL CITRATE (PF) 100 MCG/2ML IJ SOLN
INTRAMUSCULAR | Status: AC
  Filled 2018-08-12: qty 2

## 2018-08-12 MED ORDER — METHYLPREDNISOLONE SODIUM SUCC 125 MG IJ SOLR: 125.0000 mg | INTRAMUSCULAR | Status: DC | PRN

## 2018-08-12 MED ORDER — DIPHENHYDRAMINE HCL 50 MG/ML IJ SOLN: 50.0000 mg | Freq: Once | INTRAMUSCULAR | Status: DC

## 2018-08-12 MED ORDER — LISINOPRIL 10 MG PO TABS
10.0000 mg | ORAL_TABLET | Freq: Every day | ORAL | Status: DC
  Administered 2018-08-13: 10 mg via ORAL
  Filled 2018-08-12 (×2): qty 1

## 2018-08-12 MED ORDER — FLUTICASONE-UMECLIDIN-VILANT 100-62.5-25 MCG/INH IN AEPB: 1.0000 | INHALATION_SPRAY | Freq: Every day | RESPIRATORY_TRACT | Status: DC

## 2018-08-12 MED ORDER — HYDROCODONE-ACETAMINOPHEN 5-325 MG PO TABS
1.0000 | ORAL_TABLET | ORAL | Status: DC | PRN
  Administered 2018-08-12 (×2): 2 via ORAL
  Administered 2018-08-13: 1 via ORAL
  Administered 2018-08-13: 2 via ORAL
  Filled 2018-08-12 (×3): qty 2
  Filled 2018-08-12: qty 1

## 2018-08-12 MED ORDER — HEPARIN SODIUM (PORCINE) 1000 UNIT/ML IJ SOLN
INTRAMUSCULAR | Status: AC
  Filled 2018-08-12: qty 1

## 2018-08-12 MED ORDER — MIDAZOLAM HCL 2 MG/ML PO SYRP: 8.0000 mg | ORAL_SOLUTION | Freq: Once | ORAL | Status: DC | PRN

## 2018-08-12 MED ORDER — CEFAZOLIN SODIUM-DEXTROSE 2-4 GM/100ML-% IV SOLN
INTRAVENOUS | Status: AC
  Administered 2018-08-12: 2 g via INTRAVENOUS
  Filled 2018-08-12: qty 100

## 2018-08-12 MED ORDER — UMECLIDINIUM BROMIDE 62.5 MCG/INH IN AEPB
1.0000 | INHALATION_SPRAY | Freq: Every day | RESPIRATORY_TRACT | Status: DC
  Administered 2018-08-13: 1 via RESPIRATORY_TRACT
  Filled 2018-08-12: qty 7

## 2018-08-12 MED ORDER — TIROFIBAN HCL IV 12.5 MG/250 ML
0.1500 ug/kg/min | INTRAVENOUS | Status: DC
  Administered 2018-08-13: 0.15 ug/kg/min via INTRAVENOUS
  Filled 2018-08-12: qty 250

## 2018-08-12 MED ORDER — NICOTINE 21 MG/24HR TD PT24
21.0000 mg | MEDICATED_PATCH | Freq: Every day | TRANSDERMAL | Status: DC
  Administered 2018-08-12 – 2018-08-13 (×2): 21 mg via TRANSDERMAL
  Filled 2018-08-12 (×2): qty 1

## 2018-08-12 MED ORDER — SODIUM CHLORIDE 0.9 % IV SOLN
INTRAVENOUS | Status: DC
  Administered 2018-08-12: 10:00:00 via INTRAVENOUS

## 2018-08-12 MED ORDER — HEPARIN SODIUM (PORCINE) 1000 UNIT/ML IJ SOLN
INTRAMUSCULAR | Status: DC | PRN
  Administered 2018-08-12: 4000 [IU] via INTRAVENOUS

## 2018-08-12 MED ORDER — TIROFIBAN HCL IV 12.5 MG/250 ML
INTRAVENOUS | Status: AC
  Filled 2018-08-12: qty 250

## 2018-08-12 MED ORDER — ATORVASTATIN CALCIUM 20 MG PO TABS
20.0000 mg | ORAL_TABLET | Freq: Every day | ORAL | Status: DC
  Administered 2018-08-12 – 2018-08-13 (×2): 20 mg via ORAL
  Filled 2018-08-12 (×3): qty 1

## 2018-08-12 MED ORDER — IOPAMIDOL (ISOVUE-300) INJECTION 61%
INTRAVENOUS | Status: DC | PRN
  Administered 2018-08-12: 80 mL via INTRA_ARTERIAL

## 2018-08-12 MED ORDER — HYDROMORPHONE HCL 1 MG/ML IJ SOLN: 1.0000 mg | Freq: Once | INTRAMUSCULAR | Status: DC | PRN

## 2018-08-12 MED ORDER — ONDANSETRON HCL 4 MG/2ML IJ SOLN: 4.0000 mg | Freq: Four times a day (QID) | INTRAMUSCULAR | Status: DC | PRN

## 2018-08-12 MED ORDER — GABAPENTIN 300 MG PO CAPS
300.0000 mg | ORAL_CAPSULE | Freq: Two times a day (BID) | ORAL | Status: DC
  Administered 2018-08-12 – 2018-08-13 (×2): 300 mg via ORAL
  Filled 2018-08-12 (×2): qty 1

## 2018-08-12 MED ORDER — ALTEPLASE 2 MG IJ SOLR
INTRAMUSCULAR | Status: DC | PRN
  Administered 2018-08-12: 8 mg

## 2018-08-12 MED ORDER — FLUTICASONE FUROATE-VILANTEROL 100-25 MCG/INH IN AEPB
1.0000 | INHALATION_SPRAY | Freq: Every day | RESPIRATORY_TRACT | Status: DC
  Administered 2018-08-13: 1 via RESPIRATORY_TRACT
  Filled 2018-08-12: qty 28

## 2018-08-12 MED ORDER — VITAMIN B-12 1000 MCG PO TABS
1000.0000 ug | ORAL_TABLET | Freq: Every day | ORAL | Status: DC
  Administered 2018-08-12 – 2018-08-13 (×2): 1000 ug via ORAL
  Filled 2018-08-12 (×2): qty 1

## 2018-08-12 MED ORDER — LABETALOL HCL 5 MG/ML IV SOLN
INTRAVENOUS | Status: AC
  Filled 2018-08-12: qty 4

## 2018-08-12 MED ORDER — FAMOTIDINE 20 MG PO TABS: 40.0000 mg | ORAL_TABLET | ORAL | Status: DC | PRN

## 2018-08-12 MED ORDER — MIDAZOLAM HCL 5 MG/5ML IJ SOLN
INTRAMUSCULAR | Status: AC
  Filled 2018-08-12: qty 5

## 2018-08-12 MED ORDER — FENTANYL CITRATE (PF) 100 MCG/2ML IJ SOLN
INTRAMUSCULAR | Status: DC | PRN
  Administered 2018-08-12 (×3): 50 ug via INTRAVENOUS

## 2018-08-12 MED ORDER — TIROFIBAN (AGGRASTAT) BOLUS VIA INFUSION
25.0000 ug/kg | Freq: Once | INTRAVENOUS | Status: AC
  Administered 2018-08-12: 12500 ug via INTRAVENOUS
  Filled 2018-08-12: qty 30

## 2018-08-12 MED ORDER — ASPIRIN EC 81 MG PO TBEC
81.0000 mg | DELAYED_RELEASE_TABLET | Freq: Every day | ORAL | Status: DC
  Administered 2018-08-13: 81 mg via ORAL
  Filled 2018-08-12: qty 1

## 2018-08-12 SURGICAL SUPPLY — 16 items
BALLN ULTRVRSE 2.5X300X150 (BALLOONS) ×2
BALLOON ULTRVRSE 2.5X300X150 (BALLOONS) ×1 IMPLANT
CANISTER PENUMBRA ENGINE (MISCELLANEOUS) ×2 IMPLANT
CATH BEACON 5 .038 100 VERT TP (CATHETERS) ×2 IMPLANT
CATH INDIGO CAT6 KIT (CATHETERS) ×2 IMPLANT
CATH PIG 70CM (CATHETERS) ×2 IMPLANT
DEVICE PRESTO INFLATION (MISCELLANEOUS) ×2 IMPLANT
DEVICE STARCLOSE SE CLOSURE (Vascular Products) ×2 IMPLANT
GLIDEWIRE ADV .035X260CM (WIRE) ×2 IMPLANT
PACK ANGIOGRAPHY (CUSTOM PROCEDURE TRAY) ×2 IMPLANT
SHEATH BRITE TIP 5FRX11 (SHEATH) ×2 IMPLANT
SHEATH DESTIN RDC 6FR 45 (SHEATH) ×2 IMPLANT
STENT VIABAHN 6X7.5X120 (Permanent Stent) ×2 IMPLANT
TUBING CONTRAST HIGH PRESS 72 (TUBING) ×2 IMPLANT
WIRE G V18X300CM (WIRE) ×2 IMPLANT
WIRE J 3MM .035X145CM (WIRE) ×2 IMPLANT

## 2018-08-12 NOTE — Op Note (Signed)
[facility]  Percutaneous Study/Intervention Procedural Note   Date of Surgery: 08/12/2018  Surgeon(s):DEW,JASON    Assistants:none  Pre-operative Diagnosis: PAD with rest pain right foot  Post-operative diagnosis:  Same  Procedure(s) Performed:             1.  Ultrasound guidance for vascular access left femoral artery             2.  Catheter placement into right SFA from left femoral approach             3.  Aortogram and selective right lower extremity angiogram including selective images of the right peroneal artery and the right posterior tibial arteries             4.   Catheter directed thrombolytic therapy with 8 mg of TPA delivered to the right SFA and popliteal arteries             5.   Mechanical thrombectomy to the right SFA, popliteal artery, tibioperoneal trunk, and peroneal artery using the penumbra cat 6 device  6.  Percutaneous transluminal angioplasty of the right peroneal artery and tibioperoneal trunk using 2.5 mm diameter by 30 cm length angioplasty balloon  7.  Viabahn covered stent placement to the right mid SFA for residual stenosis and thrombus after thrombolysis and thrombectomy using a 6 mm diameter by 7.5 cm length stent  8.  Percutaneous transluminal angioplasty of the right posterior tibial artery and tibioperoneal trunk using a 2.5 mm diameter by 30 cm length angioplasty balloon             9.  StarClose closure device left femoral artery  EBL: 100 cc  Contrast: 80 cc  Fluoro Time: 11.8 minutes  Moderate Conscious Sedation Time: approximately 60 minutes using 5 mg of Versed and 150 Mcg of Fentanyl              Indications:  Patient is a 62 y.o.male with recurrent rest pain of the right foot after multiple previous interventions after recently resuming smoking. The patient has noninvasive study showing occlusion of his right SFA stent. The patient is brought in for angiography for further evaluation and potential treatment.  Due  to the limb threatening nature of the situation, angiogram was performed for attempted limb salvage. The patient is aware that if the procedure fails, amputation would be expected.  The patient also understands that even with successful revascularization, amputation may still be required due to the severity of the situation.  Risks and benefits are discussed and informed consent is obtained.   Procedure:  The patient was identified and appropriate procedural time out was performed.  The patient was then placed supine on the table and prepped and draped in the usual sterile fashion. Moderate conscious sedation was administered during a face to face encounter with the patient throughout the procedure with my supervision of the RN administering medicines and monitoring the patient's vital signs, pulse oximetry, telemetry and mental status throughout from the start of the procedure until the patient was taken to the recovery room. Ultrasound was used to evaluate the left common femoral artery.  It was patent .  A digital ultrasound image was acquired.  A Seldinger needle was used to access the left common femoral artery under direct ultrasound guidance and a permanent image was performed.  A 0.035 J wire was advanced without resistance and a 5Fr sheath was placed.  Pigtail catheter was placed into the aorta and an AP  aortogram was performed. This demonstrated normal aorta and iliac segments without significant stenosis.  The renal arteries were not well seen this may have been due to catheter position.  I then crossed the aortic bifurcation and advanced to the right femoral head and down into the proximal right SFA to help opacify distally. Selective right lower extremity angiogram was then performed. This demonstrated occlusion of the SFA about 3 to 4 cm above the previously placed stent with very faint reconstitution of the mid to distal peroneal arteries and posterior tibial arteries distally. It was felt that it  was in the patient's best interest to proceed with intervention after these images to avoid a second procedure and a larger amount of contrast and fluoroscopy based off of the findings from the initial angiogram. The patient was systemically heparinized and I advanced the pigtail catheter down to the right SFA and popliteal artery occlusion and placed 8 mg of TPA to allow it to dwell.  The pigtail was removed.  A 6 French Ansell sheath was then placed over the [employer] wire. I then used a Kumpe catheter and the advantage wire to easily navigate through the SFA, popliteal, tibioperoneal trunk, and proximal peroneal occlusions and confirm intraluminal flow in the mid peroneal artery.  I then exchanged for a 0.018 wire and began mechanical thrombectomy with the penumbra cat 6 device.  Multiple passes were made with a large thick chunks of thrombus easily removed all the way down into the peroneal artery.  After multiple passes with the thrombectomy device, imaging showed a stenosis with thrombus about 4 to 5 cm above the previously placed SFA stent.  Over the 0.018 wire a 6 mm diameter by 7.5 cm length via bond stent was deployed across this lesion with excellent angiographic completion result and less than 10% residual stenosis.  More distally, there remained thrombus and stenosis in the proximal peroneal artery, tibioperoneal trunk, and proximal posterior tibial artery which could not be seen better.  A 2.5 mm length by 30 cm length angioplasty balloon was then inflated from the mid peroneal artery up through the proximal peroneal artery and tibioperoneal trunk.  This was inflated to 6 atm for 1 minute.  Completion imaging showed less than 20% residual stenosis in the peroneal artery.  There remained a greater than 90% stenosis or short segment occlusion in the proximal posterior tibial artery and with the help of the Kumpe catheter and the advantage wire I was able to gain access to this and cross the  stenosis and confirm intraluminal flow in the posterior tibial artery.  I then replaced a 0.018 wire and proceeded with angioplasty.  The 2.5 mm diameter by 30 cm length angioplasty balloon was then inflated to 8 atm for 1 minute.  Completion imaging showed less than 20% residual stenosis in the posterior tibial artery after angioplasty with now two-vessel runoff distally. I elected to terminate the procedure. The sheath was removed and StarClose closure device was deployed in the left femoral artery with excellent hemostatic result. The patient was taken to the recovery room in stable condition having tolerated the procedure well.  Findings:               Aortogram:  Renal arteries not well seen but this may have been due to catheter position.  Aorta and iliac arteries were widely patent without significant stenosis             Right lower Extremity:  This demonstrated occlusion of the  SFA about 3 to 4 cm above the previously placed stent with very faint reconstitution of the mid to distal peroneal arteries and posterior tibial arteries distally   Disposition: Patient was taken to the recovery room in stable condition having tolerated the procedure well.  Complications: None  Leotis Pain 08/12/2018 11:47 AM   This note was created with Dragon Medical transcription system. Any errors in dictation are purely unintentional.

## 2018-08-12 NOTE — H&P (Signed)
[facility] History & Physical Update  The patient was interviewed and re-examined.  The patient's previous History and Physical has been reviewed and is unchanged.  There is no change in the plan of care. We plan to proceed with the scheduled procedure.  Leotis Pain, MD  08/12/2018, 10:12 AM

## 2018-08-13 DIAGNOSIS — G709 Myoneural disorder, unspecified: Secondary | ICD-10-CM | POA: Diagnosis not present

## 2018-08-13 DIAGNOSIS — I70221 Atherosclerosis of native arteries of extremities with rest pain, right leg: Secondary | ICD-10-CM | POA: Diagnosis not present

## 2018-08-13 DIAGNOSIS — Z95820 Peripheral vascular angioplasty status with implants and grafts: Secondary | ICD-10-CM

## 2018-08-13 DIAGNOSIS — Z79899 Other long term (current) drug therapy: Secondary | ICD-10-CM | POA: Diagnosis not present

## 2018-08-13 DIAGNOSIS — I998 Other disorder of circulatory system: Secondary | ICD-10-CM

## 2018-08-13 DIAGNOSIS — I10 Essential (primary) hypertension: Secondary | ICD-10-CM | POA: Diagnosis not present

## 2018-08-13 DIAGNOSIS — F17210 Nicotine dependence, cigarettes, uncomplicated: Secondary | ICD-10-CM | POA: Diagnosis not present

## 2018-08-13 DIAGNOSIS — Z7901 Long term (current) use of anticoagulants: Secondary | ICD-10-CM | POA: Diagnosis not present

## 2018-08-13 DIAGNOSIS — Z7982 Long term (current) use of aspirin: Secondary | ICD-10-CM | POA: Diagnosis not present

## 2018-08-13 DIAGNOSIS — Z8249 Family history of ischemic heart disease and other diseases of the circulatory system: Secondary | ICD-10-CM | POA: Diagnosis not present

## 2018-08-13 MED ORDER — ATORVASTATIN CALCIUM 20 MG PO TABS: 20.0000 mg | ORAL_TABLET | Freq: Every day | ORAL | 3 refills | Status: DC

## 2018-08-13 MED ORDER — GABAPENTIN 300 MG PO CAPS: 300.0000 mg | ORAL_CAPSULE | Freq: Two times a day (BID) | ORAL | 1 refills | Status: DC

## 2018-08-13 MED ORDER — NICOTINE 14 MG/24HR TD PT24: 14.0000 mg | MEDICATED_PATCH | Freq: Every day | TRANSDERMAL | Status: DC

## 2018-08-13 MED ORDER — APIXABAN 5 MG PO TABS: 5.0000 mg | ORAL_TABLET | Freq: Two times a day (BID) | ORAL | 3 refills | Status: DC

## 2018-08-13 MED ORDER — NICOTINE 21 MG/24HR TD PT24: 21.0000 mg | MEDICATED_PATCH | Freq: Every day | TRANSDERMAL | 1 refills | Status: DC

## 2018-08-13 MED ORDER — ASPIRIN EC 81 MG PO TBEC: 81.0000 mg | DELAYED_RELEASE_TABLET | Freq: Every day | ORAL | 3 refills | Status: AC

## 2018-08-13 MED ORDER — APIXABAN 5 MG PO TABS
5.0000 mg | ORAL_TABLET | Freq: Two times a day (BID) | ORAL | Status: DC
  Administered 2018-08-13: 5 mg via ORAL
  Filled 2018-08-13: qty 1

## 2018-08-13 MED ORDER — HYDROCODONE-ACETAMINOPHEN 5-325 MG PO TABS: 1.0000 | ORAL_TABLET | Freq: Four times a day (QID) | ORAL | 0 refills | Status: DC | PRN

## 2018-08-13 NOTE — Progress Notes (Signed)
Patient given discharge instructions with wife at bedside. IV taken out and tele monitor off. Prescriptions given to patient. PAD removed by [facility] PA Joelene Millin. Patient verbalized understanding with no further questions or concerns. Patient going home in stable condition.

## 2018-08-13 NOTE — Plan of Care (Signed)

## 2018-08-13 NOTE — Discharge Instructions (Signed)
You may shower as of tomorrow. Please keep left groin clean and dry.   You are excused from work until [facility] 08/19/18.

## 2018-08-13 NOTE — Discharge Summary (Signed)
[facility]    Discharge Summary  Patient ID:  Ryan [name] MRN: [id number] DOB/AGE: 62-May-1957 62 y.o.  Admit date: 08/12/2018 Discharge date: 08/13/2018 Date of Surgery: 08/12/2018 Surgeon: Surgeon(s): Algernon Huxley, MD  Admission Diagnosis: Ischemic leg [I99.8]  Discharge Diagnoses:  Ischemic leg [I99.8]  Secondary Diagnoses: Past Medical History:  Diagnosis Date  . Hypertension   . Neuromuscular disorder (HCC)    numbness feet  . Peripheral vascular disease ([place])   . Shortness of breath dyspnea    Procedure(s): LOWER EXTREMITY ANGIOGRAPHY (RIGHT)  Discharged Condition: good  HPI:  The patient is a 63 year old male well-known to our practice. Patient presented to our clinic two days ago with a chief complaint of progressively worsening right lower extremity pain.  This pain has been present for about 2 weeks.  The patient does have a past medical history of peripheral artery disease requiring multiple endovascular interventions.  The patient notes that this pain is similar to the discomfort he has felt in the past when his stents have gone down.  Patient notes that his pain has progressively worsened to the point he is now seeking medical attention.  Patient denies any ulceration to the right foot.  Patient notes that his pain is starting to occur at night as well.  The patient underwent a bilateral ABI which was notable for: Right: 0.22, occluded SFA and popliteal stents noted on ultrasound.  Absent anterior tibial artery with monophasic posterior tibial artery. Left: 1.24, biphasic anterior tibial artery, triphasic posterior tibial artery On 08/12/18, the patient underwent:  1. Ultrasound guidance for vascular access left femoral artery 2. Catheter placement into right SFA from left femoral approach 3. Aortogram and selective right lower extremity angiogram including selective images of the right peroneal artery and the  right posterior tibial arteries 4.  Catheter directed thrombolytic therapy with 8 mg of TPA delivered to the right SFA and popliteal arteries 5.  Mechanical thrombectomy to the right SFA, popliteal artery, tibioperoneal trunk, and peroneal artery using the penumbra cat 6 device             6.  Percutaneous transluminal angioplasty of the right peroneal artery and tibioperoneal trunk using 2.5 mm diameter by 30 cm length angioplasty balloon             7.  Viabahn covered stent placement to the right mid SFA for residual stenosis and thrombus after thrombolysis and thrombectomy using a 6 mm diameter by 7.5 cm length stent             8.  Percutaneous transluminal angioplasty of the right posterior tibial artery and tibioperoneal trunk using a 2.5 mm diameter by 30 cm length angioplasty balloon 9. StarClose closure device left femoral artery  The patient tolerated the procedure well was transferred from the interventional radiology unit to the surgical floor without issue.  The patient night of procedure was unremarkable.  The patient was treated with Aggrastat overnight and transition to PO Eliquis and ASA the following morning.  There was a market improvement in the patient's right lower extremity arterial patency on physical exam status post his endovascular intervention.  Upon discharge, the patient was tolerating a regular diet, urinating independently, his pain was controlled with the use of PO pain medication and he was ambulating independently.  Hospital Course:  Ryan [name] is a 63 y.o. male is S/P Right:  Procedure(s): LOWER EXTREMITY ANGIOGRAPHY  Extubated: POD # 0  Physical exam:  A&Ox3, NAD CV: RRR Pulm: CTA Bilaterally Abdomen: Soft, nontender, nondistended, positive bowel sounds Left Groin: PAD removed.  Access site is clean dry and intact.  There is no swelling, ecchymosis or drainage noted. Vascular:   Right lower extremity: Thigh  soft.  Calf soft.  Extremity is warm distally to the toes.   Palpable pedal pulses.  Good capillary refill to the toes.  Motor/sensory is intact.  Post-op wounds clean, dry, intact or healing well  Pt. Ambulating, voiding and taking PO diet without difficulty.  Pt pain controlled with PO pain meds.  Labs as below  Complications: None  Consults: None  Significant Diagnostic Studies: CBC Lab Results  Component Value Date   WBC 5.1 07/21/2018   HGB 9.8 (L) 07/21/2018   HCT 28.7 (L) 07/21/2018   MCV 94.4 07/21/2018   PLT 214 07/21/2018   BMET    Component Value Date/Time   NA 137 07/21/2018 1501   NA 137 08/31/2015 0929   K 4.2 07/21/2018 1501   CL 108 07/21/2018 1501   CO2 21 07/21/2018 1501   GLUCOSE 107 07/21/2018 1501   BUN 18 08/12/2018 0937   BUN 11 08/31/2015 0929   CREATININE 1.28 (H) 08/12/2018 0937   CREATININE 1.95 (H) 07/21/2018 1501   CALCIUM 8.7 07/21/2018 1501   GFRNONAA >60 08/12/2018 0937   GFRNONAA 36 (L) 07/21/2018 1501   GFRAA >60 08/12/2018 0937   GFRAA 42 (L) 07/21/2018 1501   COAG No results found for: INR, PROTIME  Disposition:  Discharge to :Home  Allergies as of 08/13/2018   No Known Allergies     Medication List    STOP taking these medications   nicotine 14 mg/24hr patch Commonly known as:  NICODERM CQ - dosed in mg/24 hours Replaced by:  nicotine 21 mg/24hr patch     TAKE these medications   apixaban 5 MG Tabs tablet Commonly known as:  ELIQUIS Take 1 tablet (5 mg total) by mouth 2 (two) times daily.   aspirin EC 81 MG tablet Take 1 tablet (81 mg total) by mouth daily.   atorvastatin 20 MG tablet Commonly known as:  LIPITOR Take 1 tablet (20 mg total) by mouth daily.   Fluticasone-Umeclidin-Vilant 100-62.5-25 MCG/INH Aepb Commonly known as:  TRELEGY ELLIPTA Inhale 1 puff into the lungs daily. In place of Anoro   gabapentin 300 MG capsule Commonly known as:  NEURONTIN Take 1 capsule (300 mg total) by mouth 2 (two)  times daily for 30 days.   HYDROcodone-acetaminophen 5-325 MG tablet Commonly known as:  NORCO/VICODIN Take 1 tablet by mouth every 6 (six) hours as needed for moderate pain or severe pain. What changed:    how much to take  when to take this  reasons to take this   lisinopril 10 MG tablet Commonly known as:  PRINIVIL,ZESTRIL Take 1 tablet (10 mg total) by mouth daily.   nicotine 21 mg/24hr patch Commonly known as:  NICODERM CQ - dosed in mg/24 hours Place 1 patch (21 mg total) onto the skin daily. Start taking on:  August 14, 2018 Replaces:  nicotine 14 mg/24hr patch   nystatin-triamcinolone ointment Commonly known as:  MYCOLOG Apply 1 application topically 2 (two) times daily.   Vitamin B-12 1000 MCG Subl Place 1 tablet (1,000 mcg total) under the tongue daily.      Verbal and written Discharge instructions given to the patient. Wound care per Discharge AVS Follow-up Information    Dew, Erskine Squibb, MD Follow up in  1 month(s).   Specialties:  Vascular Surgery, Radiology, Interventional Cardiology Why:  Can see Dew or midlevel. Will need ABI with visit.  Contact information: [place] [address] [phone]          Signed: Sela Hua, PA-C  08/13/2018, 11:34 AM

## 2018-08-31 ENCOUNTER — Telehealth (INDEPENDENT_AMBULATORY_CARE_PROVIDER_SITE_OTHER): Payer: Self-pay | Admitting: Vascular Surgery

## 2018-08-31 ENCOUNTER — Other Ambulatory Visit (INDEPENDENT_AMBULATORY_CARE_PROVIDER_SITE_OTHER): Payer: Self-pay | Admitting: Nurse Practitioner

## 2018-08-31 ENCOUNTER — Ambulatory Visit (INDEPENDENT_AMBULATORY_CARE_PROVIDER_SITE_OTHER): Payer: BLUE CROSS/BLUE SHIELD | Admitting: Nurse Practitioner

## 2018-08-31 ENCOUNTER — Encounter (INDEPENDENT_AMBULATORY_CARE_PROVIDER_SITE_OTHER): Payer: BLUE CROSS/BLUE SHIELD

## 2018-08-31 ENCOUNTER — Encounter (INDEPENDENT_AMBULATORY_CARE_PROVIDER_SITE_OTHER): Payer: Self-pay | Admitting: Nurse Practitioner

## 2018-08-31 DIAGNOSIS — M79604 Pain in right leg: Secondary | ICD-10-CM

## 2018-08-31 DIAGNOSIS — Z95820 Peripheral vascular angioplasty status with implants and grafts: Secondary | ICD-10-CM

## 2018-08-31 NOTE — Telephone Encounter (Signed)
That is fine, I think we have open spots in ultrasound today.  Let's bring him in for ABIs

## 2018-08-31 NOTE — Telephone Encounter (Signed)
Spoke with Ryan [name], he said leg kept him from sleeping this weekend. He is concerned that the stint that was placed is now blocked. Would like to be seen. Please advise. AS, CMA

## 2018-09-02 ENCOUNTER — Encounter (INDEPENDENT_AMBULATORY_CARE_PROVIDER_SITE_OTHER): Payer: Self-pay | Admitting: Nurse Practitioner

## 2018-09-02 ENCOUNTER — Ambulatory Visit (INDEPENDENT_AMBULATORY_CARE_PROVIDER_SITE_OTHER): Payer: BLUE CROSS/BLUE SHIELD | Admitting: Nurse Practitioner

## 2018-09-02 ENCOUNTER — Ambulatory Visit (INDEPENDENT_AMBULATORY_CARE_PROVIDER_SITE_OTHER): Payer: BLUE CROSS/BLUE SHIELD

## 2018-09-02 ENCOUNTER — Other Ambulatory Visit: Payer: Self-pay

## 2018-09-02 ENCOUNTER — Encounter

## 2018-09-02 VITALS — BP 126/83 | HR 86 | Resp 10 | Ht 68.0 in | Wt 123.0 lb

## 2018-09-02 DIAGNOSIS — I998 Other disorder of circulatory system: Secondary | ICD-10-CM | POA: Diagnosis not present

## 2018-09-02 DIAGNOSIS — Z720 Tobacco use: Secondary | ICD-10-CM

## 2018-09-02 DIAGNOSIS — Z79899 Other long term (current) drug therapy: Secondary | ICD-10-CM

## 2018-09-02 DIAGNOSIS — M79604 Pain in right leg: Secondary | ICD-10-CM

## 2018-09-02 DIAGNOSIS — J438 Other emphysema: Secondary | ICD-10-CM | POA: Diagnosis not present

## 2018-09-02 DIAGNOSIS — I10 Essential (primary) hypertension: Secondary | ICD-10-CM | POA: Diagnosis not present

## 2018-09-02 DIAGNOSIS — Z95820 Peripheral vascular angioplasty status with implants and grafts: Secondary | ICD-10-CM | POA: Diagnosis not present

## 2018-09-03 ENCOUNTER — Telehealth (INDEPENDENT_AMBULATORY_CARE_PROVIDER_SITE_OTHER): Payer: Self-pay

## 2018-09-03 ENCOUNTER — Other Ambulatory Visit: Payer: Self-pay

## 2018-09-03 ENCOUNTER — Encounter (INDEPENDENT_AMBULATORY_CARE_PROVIDER_SITE_OTHER): Payer: Self-pay | Admitting: Nurse Practitioner

## 2018-09-03 ENCOUNTER — Encounter
Admission: AD | Disposition: A | Discharge status: Home / Self Care | Payer: Self-pay | Source: Home / Self Care | Attending: Vascular Surgery

## 2018-09-03 ENCOUNTER — Inpatient Hospital Stay
Admission: AD | Admit: 2018-09-03 | Discharge: 2018-09-06 | DRG: 272 | Disposition: A | Discharge status: Home / Self Care | Payer: BLUE CROSS/BLUE SHIELD | Attending: Vascular Surgery | Admitting: Vascular Surgery

## 2018-09-03 ENCOUNTER — Telehealth (INDEPENDENT_AMBULATORY_CARE_PROVIDER_SITE_OTHER): Payer: Self-pay | Admitting: Nurse Practitioner

## 2018-09-03 ENCOUNTER — Other Ambulatory Visit (INDEPENDENT_AMBULATORY_CARE_PROVIDER_SITE_OTHER): Payer: Self-pay | Admitting: Nurse Practitioner

## 2018-09-03 DIAGNOSIS — Z9889 Other specified postprocedural states: Secondary | ICD-10-CM | POA: Diagnosis not present

## 2018-09-03 DIAGNOSIS — I70221 Atherosclerosis of native arteries of extremities with rest pain, right leg: Secondary | ICD-10-CM | POA: Diagnosis present

## 2018-09-03 DIAGNOSIS — Z825 Family history of asthma and other chronic lower respiratory diseases: Secondary | ICD-10-CM

## 2018-09-03 DIAGNOSIS — Z79899 Other long term (current) drug therapy: Secondary | ICD-10-CM

## 2018-09-03 DIAGNOSIS — Z7982 Long term (current) use of aspirin: Secondary | ICD-10-CM

## 2018-09-03 DIAGNOSIS — J438 Other emphysema: Secondary | ICD-10-CM | POA: Diagnosis present

## 2018-09-03 DIAGNOSIS — F17200 Nicotine dependence, unspecified, uncomplicated: Secondary | ICD-10-CM | POA: Diagnosis not present

## 2018-09-03 DIAGNOSIS — I10 Essential (primary) hypertension: Secondary | ICD-10-CM | POA: Diagnosis not present

## 2018-09-03 DIAGNOSIS — I998 Other disorder of circulatory system: Secondary | ICD-10-CM | POA: Diagnosis present

## 2018-09-03 DIAGNOSIS — Y713 Surgical instruments, materials and cardiovascular devices (including sutures) associated with adverse incidents: Secondary | ICD-10-CM | POA: Diagnosis present

## 2018-09-03 DIAGNOSIS — T82898A Other specified complication of vascular prosthetic devices, implants and grafts, initial encounter: Principal | ICD-10-CM | POA: Diagnosis present

## 2018-09-03 DIAGNOSIS — Z8249 Family history of ischemic heart disease and other diseases of the circulatory system: Secondary | ICD-10-CM

## 2018-09-03 DIAGNOSIS — Z716 Tobacco abuse counseling: Secondary | ICD-10-CM

## 2018-09-03 DIAGNOSIS — I70229 Atherosclerosis of native arteries of extremities with rest pain, unspecified extremity: Secondary | ICD-10-CM

## 2018-09-03 DIAGNOSIS — I739 Peripheral vascular disease, unspecified: Secondary | ICD-10-CM | POA: Diagnosis not present

## 2018-09-03 DIAGNOSIS — F17210 Nicotine dependence, cigarettes, uncomplicated: Secondary | ICD-10-CM | POA: Diagnosis not present

## 2018-09-03 HISTORY — PX: LOWER EXTREMITY ANGIOGRAPHY: CATH118251

## 2018-09-03 LAB — CBC
HCT: 33.7 % — ABNORMAL LOW (ref 39.0–52.0)
HCT: 34.5 % — ABNORMAL LOW (ref 39.0–52.0)
Hemoglobin: 11.3 g/dL — ABNORMAL LOW (ref 13.0–17.0)
Hemoglobin: 11.6 g/dL — ABNORMAL LOW (ref 13.0–17.0)
MCH: 31.6 pg (ref 26.0–34.0)
MCH: 31.4 pg (ref 26.0–34.0)
MCHC: 33.5 g/dL (ref 30.0–36.0)
MCHC: 33.6 g/dL (ref 30.0–36.0)
MCV: 94.1 fL (ref 80.0–100.0)
MCV: 93.5 fL (ref 80.0–100.0)
NRBC: 0 % (ref 0.0–0.2)
Platelets: 250 10*3/uL (ref 150–400)
Platelets: 230 10*3/uL (ref 150–400)
RBC: 3.58 MIL/uL — ABNORMAL LOW (ref 4.22–5.81)
RBC: 3.69 MIL/uL — ABNORMAL LOW (ref 4.22–5.81)
RDW: 12 % (ref 11.5–15.5)
RDW: 12.1 % (ref 11.5–15.5)
WBC: 7.6 10*3/uL (ref 4.0–10.5)
WBC: 8.9 10*3/uL (ref 4.0–10.5)
nRBC: 0 % (ref 0.0–0.2)

## 2018-09-03 LAB — HEPARIN LEVEL (UNFRACTIONATED)
Heparin Unfractionated: 0.36 IU/mL (ref 0.30–0.70)
Heparin Unfractionated: 0.28 IU/mL — ABNORMAL LOW (ref 0.30–0.70)

## 2018-09-03 LAB — GLUCOSE, CAPILLARY: Glucose-Capillary: 135 mg/dL — ABNORMAL HIGH (ref 70–99)

## 2018-09-03 LAB — FIBRINOGEN
Fibrinogen: 607 mg/dL — ABNORMAL HIGH (ref 210–475)
Fibrinogen: 502 mg/dL — ABNORMAL HIGH (ref 210–475)

## 2018-09-03 LAB — MRSA PCR SCREENING: MRSA by PCR: NEGATIVE

## 2018-09-03 SURGERY — LOWER EXTREMITY INTERVENTION
Anesthesia: Moderate Sedation

## 2018-09-03 SURGERY — LOWER EXTREMITY ANGIOGRAPHY
Anesthesia: Moderate Sedation | Site: Leg Lower | Laterality: Right

## 2018-09-03 MED ORDER — DIPHENHYDRAMINE HCL 50 MG/ML IJ SOLN: 50.0000 mg | Freq: Once | INTRAMUSCULAR | Status: DC | PRN

## 2018-09-03 MED ORDER — ASPIRIN EC 81 MG PO TBEC: 81.0000 mg | DELAYED_RELEASE_TABLET | Freq: Every day | ORAL | Status: DC

## 2018-09-03 MED ORDER — LABETALOL HCL 5 MG/ML IV SOLN: 10.0000 mg | INTRAVENOUS | Status: DC | PRN

## 2018-09-03 MED ORDER — SODIUM CHLORIDE 0.9 % IV SOLN: INTRAVENOUS | Status: DC

## 2018-09-03 MED ORDER — HEPARIN SODIUM (PORCINE) 1000 UNIT/ML IJ SOLN
INTRAMUSCULAR | Status: AC
  Administered 2018-09-04: 3000 [IU] via INTRAVENOUS
  Filled 2018-09-03: qty 1

## 2018-09-03 MED ORDER — ATORVASTATIN CALCIUM 20 MG PO TABS
20.0000 mg | ORAL_TABLET | Freq: Every day | ORAL | Status: DC
  Administered 2018-09-04 – 2018-09-05 (×2): 20 mg via ORAL
  Filled 2018-09-03 (×2): qty 1

## 2018-09-03 MED ORDER — SODIUM CHLORIDE 0.9 % IV SOLN: 250.0000 mL | INTRAVENOUS | Status: DC | PRN

## 2018-09-03 MED ORDER — DEXMEDETOMIDINE HCL IN NACL 400 MCG/100ML IV SOLN: 0.4000 ug/kg/h | INTRAVENOUS | Status: DC

## 2018-09-03 MED ORDER — SODIUM CHLORIDE 0.9% FLUSH
3.0000 mL | Freq: Two times a day (BID) | INTRAVENOUS | Status: DC
  Administered 2018-09-03: 3 mL via INTRAVENOUS

## 2018-09-03 MED ORDER — ACETAMINOPHEN 325 MG PO TABS: 650.0000 mg | ORAL_TABLET | Freq: Four times a day (QID) | ORAL | Status: DC | PRN

## 2018-09-03 MED ORDER — FLEET ENEMA 7-19 GM/118ML RE ENEM: 1.0000 | ENEMA | Freq: Once | RECTAL | Status: DC | PRN

## 2018-09-03 MED ORDER — HYDRALAZINE HCL 20 MG/ML IJ SOLN
INTRAMUSCULAR | Status: DC | PRN
  Administered 2018-09-03: 10 mg via INTRAVENOUS

## 2018-09-03 MED ORDER — NICOTINE 21 MG/24HR TD PT24
21.0000 mg | MEDICATED_PATCH | Freq: Every day | TRANSDERMAL | Status: DC
  Administered 2018-09-05 – 2018-09-06 (×3): 21 mg via TRANSDERMAL
  Filled 2018-09-03 (×3): qty 1

## 2018-09-03 MED ORDER — SODIUM CHLORIDE 0.9% FLUSH
10.0000 mL | Freq: Two times a day (BID) | INTRAVENOUS | Status: DC
  Administered 2018-09-03 – 2018-09-05 (×4): 10 mL via INTRAVENOUS

## 2018-09-03 MED ORDER — HYDROMORPHONE HCL 1 MG/ML IJ SOLN: 1.0000 mg | Freq: Once | INTRAMUSCULAR | Status: DC | PRN

## 2018-09-03 MED ORDER — GABAPENTIN 300 MG PO CAPS
300.0000 mg | ORAL_CAPSULE | Freq: Two times a day (BID) | ORAL | Status: DC
  Administered 2018-09-03 – 2018-09-06 (×5): 300 mg via ORAL
  Filled 2018-09-03 (×5): qty 1

## 2018-09-03 MED ORDER — OXYCODONE HCL 5 MG PO TABS
ORAL_TABLET | ORAL | Status: AC
  Filled 2018-09-03: qty 2

## 2018-09-03 MED ORDER — ONDANSETRON HCL 4 MG/2ML IJ SOLN: 4.0000 mg | Freq: Four times a day (QID) | INTRAMUSCULAR | Status: DC | PRN

## 2018-09-03 MED ORDER — HYDRALAZINE HCL 20 MG/ML IJ SOLN
INTRAMUSCULAR | Status: AC
  Filled 2018-09-03: qty 1

## 2018-09-03 MED ORDER — FAMOTIDINE 20 MG PO TABS: 40.0000 mg | ORAL_TABLET | Freq: Once | ORAL | Status: DC | PRN

## 2018-09-03 MED ORDER — DOCUSATE SODIUM 100 MG PO CAPS
100.0000 mg | ORAL_CAPSULE | Freq: Two times a day (BID) | ORAL | Status: DC
  Administered 2018-09-03 – 2018-09-06 (×5): 100 mg via ORAL
  Filled 2018-09-03 (×5): qty 1

## 2018-09-03 MED ORDER — FENTANYL CITRATE (PF) 100 MCG/2ML IJ SOLN
INTRAMUSCULAR | Status: DC | PRN
  Administered 2018-09-03 (×2): 50 ug via INTRAVENOUS

## 2018-09-03 MED ORDER — HYDRALAZINE HCL 20 MG/ML IJ SOLN
10.0000 mg | INTRAMUSCULAR | Status: DC | PRN
  Administered 2018-09-03 – 2018-09-06 (×4): 10 mg via INTRAVENOUS
  Filled 2018-09-03 (×4): qty 1

## 2018-09-03 MED ORDER — MIDAZOLAM HCL 2 MG/2ML IJ SOLN: 1.0000 mg | INTRAMUSCULAR | Status: DC | PRN

## 2018-09-03 MED ORDER — UMECLIDINIUM BROMIDE 62.5 MCG/INH IN AEPB
1.0000 | INHALATION_SPRAY | Freq: Every day | RESPIRATORY_TRACT | Status: DC
  Administered 2018-09-05 – 2018-09-06 (×2): 1 via RESPIRATORY_TRACT
  Filled 2018-09-03: qty 7

## 2018-09-03 MED ORDER — SODIUM CHLORIDE 0.9 % IV SOLN
1.0000 mg/h | INTRAVENOUS | Status: DC
  Administered 2018-09-03 – 2018-09-04 (×2): 1 mg/h
  Filled 2018-09-03 (×5): qty 10

## 2018-09-03 MED ORDER — HYDROMORPHONE HCL 1 MG/ML IJ SOLN
1.0000 mg | INTRAMUSCULAR | Status: DC | PRN
  Administered 2018-09-03 – 2018-09-04 (×3): 1 mg via INTRAVENOUS
  Filled 2018-09-03 (×3): qty 1

## 2018-09-03 MED ORDER — ASPIRIN EC 81 MG PO TBEC
81.0000 mg | DELAYED_RELEASE_TABLET | Freq: Every day | ORAL | Status: DC
  Administered 2018-09-05 – 2018-09-06 (×2): 81 mg via ORAL
  Filled 2018-09-03 (×2): qty 1

## 2018-09-03 MED ORDER — HEPARIN SODIUM (PORCINE) 1000 UNIT/ML IJ SOLN
INTRAMUSCULAR | Status: DC | PRN
  Administered 2018-09-03: 4000 [IU] via INTRAVENOUS

## 2018-09-03 MED ORDER — HEPARIN (PORCINE) 25000 UT/250ML-% IV SOLN
INTRAVENOUS | Status: AC
  Administered 2018-09-03: 800 [IU]/h via INTRAVENOUS
  Filled 2018-09-03: qty 250

## 2018-09-03 MED ORDER — MORPHINE SULFATE (PF) 4 MG/ML IV SOLN: 5.0000 mg | INTRAVENOUS | Status: DC | PRN

## 2018-09-03 MED ORDER — SODIUM CHLORIDE 0.9% FLUSH: 3.0000 mL | INTRAVENOUS | Status: DC | PRN

## 2018-09-03 MED ORDER — DEXTROSE-NACL 5-0.9 % IV SOLN
INTRAVENOUS | Status: DC
  Administered 2018-09-03 – 2018-09-05 (×4): via INTRAVENOUS

## 2018-09-03 MED ORDER — HYDROMORPHONE HCL 1 MG/ML IJ SOLN
0.5000 mg | INTRAMUSCULAR | Status: AC | PRN
  Administered 2018-09-03: 0.5 mg via INTRAVENOUS

## 2018-09-03 MED ORDER — ALTEPLASE 2 MG IJ SOLR
INTRAMUSCULAR | Status: DC | PRN
  Administered 2018-09-03: 6 mg

## 2018-09-03 MED ORDER — HYDROCODONE-ACETAMINOPHEN 5-325 MG PO TABS: 1.0000 | ORAL_TABLET | ORAL | Status: DC | PRN

## 2018-09-03 MED ORDER — ACETAMINOPHEN 650 MG RE SUPP: 650.0000 mg | Freq: Four times a day (QID) | RECTAL | Status: DC | PRN

## 2018-09-03 MED ORDER — ONDANSETRON HCL 4 MG PO TABS: 4.0000 mg | ORAL_TABLET | Freq: Four times a day (QID) | ORAL | Status: DC | PRN

## 2018-09-03 MED ORDER — METHYLPREDNISOLONE SODIUM SUCC 125 MG IJ SOLR: 125.0000 mg | Freq: Once | INTRAMUSCULAR | Status: DC | PRN

## 2018-09-03 MED ORDER — FENTANYL CITRATE (PF) 100 MCG/2ML IJ SOLN
INTRAMUSCULAR | Status: AC
  Administered 2018-09-04: 25 ug
  Filled 2018-09-03: qty 2

## 2018-09-03 MED ORDER — MIDAZOLAM HCL 2 MG/ML PO SYRP: 8.0000 mg | ORAL_SOLUTION | Freq: Once | ORAL | Status: DC | PRN

## 2018-09-03 MED ORDER — SODIUM CHLORIDE 0.9 % IV BOLUS: 1000.0000 mL | Freq: Once | INTRAVENOUS | Status: DC

## 2018-09-03 MED ORDER — FLUTICASONE-UMECLIDIN-VILANT 100-62.5-25 MCG/INH IN AEPB: 1.0000 | INHALATION_SPRAY | Freq: Every day | RESPIRATORY_TRACT | Status: DC

## 2018-09-03 MED ORDER — SODIUM CHLORIDE 0.9 % IV SOLN
INTRAVENOUS | Status: DC
  Administered 2018-09-03: 14:00:00 via INTRAVENOUS

## 2018-09-03 MED ORDER — HYDROMORPHONE HCL 1 MG/ML IJ SOLN
0.5000 mg | Freq: Once | INTRAMUSCULAR | Status: AC
  Administered 2018-09-03: 0.5 mg via INTRAVENOUS
  Filled 2018-09-03: qty 1

## 2018-09-03 MED ORDER — MIDAZOLAM HCL 2 MG/2ML IJ SOLN
INTRAMUSCULAR | Status: DC | PRN
  Administered 2018-09-03: 1 mg via INTRAVENOUS
  Administered 2018-09-03: 2 mg via INTRAVENOUS
  Administered 2018-09-03: 1 mg via INTRAVENOUS

## 2018-09-03 MED ORDER — HYDROCODONE-ACETAMINOPHEN 5-325 MG PO TABS: 1.0000 | ORAL_TABLET | Freq: Four times a day (QID) | ORAL | Status: DC | PRN

## 2018-09-03 MED ORDER — SORBITOL 70 % SOLN
30.0000 mL | Freq: Every day | Status: DC | PRN
  Filled 2018-09-03: qty 30

## 2018-09-03 MED ORDER — IOHEXOL 300 MG/ML  SOLN
INTRAMUSCULAR | Status: DC | PRN
  Administered 2018-09-03: 50 mL via INTRAVENOUS

## 2018-09-03 MED ORDER — VITAMIN B-12 1000 MCG PO TABS
1000.0000 ug | ORAL_TABLET | Freq: Every day | ORAL | Status: DC
  Administered 2018-09-05 – 2018-09-06 (×2): 1000 ug via ORAL
  Filled 2018-09-03 (×2): qty 1

## 2018-09-03 MED ORDER — HYDROMORPHONE HCL 1 MG/ML IJ SOLN
INTRAMUSCULAR | Status: AC
  Administered 2018-09-03: 0.5 mg via INTRAVENOUS
  Filled 2018-09-03: qty 0.5

## 2018-09-03 MED ORDER — OXYCODONE HCL 5 MG PO TABS
5.0000 mg | ORAL_TABLET | ORAL | Status: DC | PRN
  Administered 2018-09-03 – 2018-09-04 (×3): 5 mg via ORAL
  Filled 2018-09-03 (×3): qty 1

## 2018-09-03 MED ORDER — HEPARIN (PORCINE) 25000 UT/250ML-% IV SOLN
800.0000 [IU]/h | INTRAVENOUS | Status: DC
  Administered 2018-09-03: 800 [IU]/h via INTRAVENOUS

## 2018-09-03 MED ORDER — OXYCODONE HCL 5 MG PO TABS
10.0000 mg | ORAL_TABLET | ORAL | Status: DC | PRN
  Administered 2018-09-03 – 2018-09-05 (×3): 10 mg via ORAL
  Filled 2018-09-03 (×3): qty 2

## 2018-09-03 MED ORDER — LISINOPRIL 5 MG PO TABS
10.0000 mg | ORAL_TABLET | Freq: Every day | ORAL | Status: DC
  Administered 2018-09-05 – 2018-09-06 (×2): 10 mg via ORAL
  Filled 2018-09-03: qty 2
  Filled 2018-09-03: qty 1
  Filled 2018-09-03: qty 2

## 2018-09-03 MED ORDER — MAGNESIUM HYDROXIDE 400 MG/5ML PO SUSP: 30.0000 mL | Freq: Every day | ORAL | Status: DC | PRN

## 2018-09-03 MED ORDER — CEFAZOLIN SODIUM-DEXTROSE 2-4 GM/100ML-% IV SOLN
2.0000 g | Freq: Once | INTRAVENOUS | Status: AC
  Administered 2018-09-03: 2 g via INTRAVENOUS

## 2018-09-03 MED ORDER — FLUTICASONE FUROATE-VILANTEROL 100-25 MCG/INH IN AEPB
1.0000 | INHALATION_SPRAY | Freq: Every day | RESPIRATORY_TRACT | Status: DC
  Administered 2018-09-05 – 2018-09-06 (×2): 1 via RESPIRATORY_TRACT
  Filled 2018-09-03: qty 28

## 2018-09-03 MED ORDER — MIDAZOLAM HCL 5 MG/5ML IJ SOLN
INTRAMUSCULAR | Status: AC
  Administered 2018-09-04: 1 mg
  Filled 2018-09-03: qty 5

## 2018-09-03 SURGICAL SUPPLY — 17 items
CANISTER PENUMBRA ENGINE (MISCELLANEOUS) ×2 IMPLANT
CATH BEACON 5 .038 100 VERT TP (CATHETERS) ×2 IMPLANT
CATH INDIGO CAT6 KIT (CATHETERS) ×2 IMPLANT
CATH INFUS 135CMX50CM (CATHETERS) ×2 IMPLANT
CATH PIG 70CM (CATHETERS) ×2 IMPLANT
COVER PROBE U/S 5X48 (MISCELLANEOUS) ×2 IMPLANT
DEVICE PRESTO INFLATION (MISCELLANEOUS) ×2 IMPLANT
GLIDEWIRE ADV .035X260CM (WIRE) ×2 IMPLANT
KIT CATH CVC 3 LUMEN 7FR 8IN (MISCELLANEOUS) ×2 IMPLANT
PACK ANGIOGRAPHY (CUSTOM PROCEDURE TRAY) ×2 IMPLANT
SHEATH BRITE TIP 5FRX11 (SHEATH) ×2 IMPLANT
SHEATH DESTIN RDC 6FR 45 (SHEATH) ×2 IMPLANT
SUT PROLENE 0 CT 1 30 (SUTURE) ×2 IMPLANT
SYR MEDRAD MARK 7 150ML (SYRINGE) ×2 IMPLANT
TUBING CONTRAST HIGH PRESS 72 (TUBING) ×2 IMPLANT
WIRE G V18X300CM (WIRE) ×2 IMPLANT
WIRE J 3MM .035X145CM (WIRE) ×2 IMPLANT

## 2018-09-03 NOTE — H&P (Signed)
[facility] History & Physical Update  The patient was interviewed and re-examined.  The patient's previous History and Physical has been reviewed and is unchanged.  There is no change in the plan of care. We plan to proceed with the scheduled procedure.  Leotis Pain, MD  09/03/2018, 2:15 PM

## 2018-09-03 NOTE — Telephone Encounter (Signed)
I have attempted to contact the patient all phone numbers in the system. A message was left on his home and mobile number, his daughter's number does not have the voice mail set up so I was unable to leave a message. The only other number on the encounter form is a fax number. The patient is scheduled for an angio today with Dr. Lucky Cowboy with an arrival time of 1:45 pm today, patient needs to be NPO.

## 2018-09-03 NOTE — Progress Notes (Signed)
SUBJECTIVE:  Patient ID: Ryan [name], male    DOB: June 19, 1956, 63 y.o.   MRN: [id number] Chief Complaint  Patient presents with  . Follow-up    HPI  DINESH ULYSSE is a 63 y.o. male The patient returns to the office for followup and review of the noninvasive studies. There has been a significant deterioration in the lower extremity symptoms.  The patient notes interval shortening of their claudication distance and development of mild rest pain symptoms. No new ulcers or wounds have occurred since the last visit.  There have been no significant changes to the patient's overall health care.  The patient denies amaurosis fugax or recent TIA symptoms. There are no recent neurological changes noted. The patient denies history of DVT, PE or superficial thrombophlebitis. The patient denies recent episodes of angina or shortness of breath.   ABI's Rt=0.38 and Lt=1.04 (previous ABI's Rt=0.22 and Lt=1.24) Duplex US of the lower extremity arterial system shows the right SFA/popliteal artery stents in TP trunk appear occluded with collateral flow.  No flow was detected within the right great toe.  Dampened monophasic flow was detected throughout the right tibial arteries.  The left tibial arteries are triphasic with the exception of the anterior tibial artery which was absent.  Past Medical History:  Diagnosis Date  . Hypertension   . Neuromuscular disorder (HCC)    numbness feet  . Peripheral vascular disease ([place])   . Shortness of breath dyspnea     Past Surgical History:  Procedure Laterality Date  . COLONOSCOPY WITH PROPOFOL N/A 02/15/2016   Procedure: COLONOSCOPY WITH PROPOFOL;  Surgeon: Lucilla Lame, MD;  Location: [facility];  Service: Endoscopy;  Laterality: N/A;  . HERNIA REPAIR  1999   left inguinal  . LOWER EXTREMITY ANGIOGRAPHY Right 05/07/2018   Procedure: LOWER EXTREMITY ANGIOGRAPHY;  Surgeon: Algernon Huxley, MD;  Location: [facility];  Service:  Cardiovascular;  Laterality: Right;  . LOWER EXTREMITY ANGIOGRAPHY Right 06/25/2018   Procedure: LOWER EXTREMITY ANGIOGRAPHY;  Surgeon: Algernon Huxley, MD;  Location: [facility];  Service: Cardiovascular;  Laterality: Right;  . LOWER EXTREMITY ANGIOGRAPHY Right 07/01/2018   Procedure: LOWER EXTREMITY ANGIOGRAPHY;  Surgeon: Algernon Huxley, MD;  Location: [facility];  Service: Cardiovascular;  Laterality: Right;  . LOWER EXTREMITY ANGIOGRAPHY Right 08/12/2018   Procedure: LOWER EXTREMITY ANGIOGRAPHY;  Surgeon: Algernon Huxley, MD;  Location: [facility];  Service: Cardiovascular;  Laterality: Right;  . LOWER EXTREMITY INTERVENTION Right 07/02/2018   Procedure: LOWER EXTREMITY INTERVENTION;  Surgeon: Algernon Huxley, MD;  Location: [facility];  Service: Cardiovascular;  Laterality: Right;  . POLYPECTOMY N/A 02/15/2016   Procedure: POLYPECTOMY;  Surgeon: Lucilla Lame, MD;  Location: [facility];  Service: Endoscopy;  Laterality: N/A;    Social History   Socioeconomic History  . Marital status: Legally Separated    Spouse name: [name]  . Number of children: 5  . Years of education: Not on file  . Highest education level: High school graduate  Occupational History  . Occupation: [employer]    Comment: [name]  Social Needs  . Financial resource strain: Not hard at all  . Food insecurity:    Worry: Never true    Inability: Never true  . Transportation needs:    Medical: No    Non-medical: No  Tobacco Use  . Smoking status: Current Every Day Smoker    Packs/day: 0.10  Years: 40.00    Pack years: 4.00    Types: Cigarettes    Start date: 07/21/1978    Last attempt to quit: 07/01/2018    Years since quitting: 0.1  . Smokeless tobacco: Never Used  Substance and Sexual Activity  . Alcohol use: Yes    Alcohol/week: 12.0 standard drinks    Types: 12 Cans of beer per week  . Drug use: Never  . Sexual activity: Yes    Partners: Female    Birth  control/protection: None  Lifestyle  . Physical activity:    Days per week: 6 days    Minutes per session: 60 min  . Stress: Not at all  Relationships  . Social connections:    Talks on phone: Three times a week    Gets together: Twice a week    Attends religious service: Never    Active member of club or organization: No    Attends meetings of clubs or organizations: Never    Relationship status: Married  . Intimate partner violence:    Fear of current or ex partner: No    Emotionally abused: No    Physically abused: No    Forced sexual activity: No  Other Topics Concern  . Not on file  Social History Narrative  . Not on file    Family History  Problem Relation Age of Onset  . COPD Mother   . Hypertension Father   . Heart attack Brother     No Known Allergies   Review of Systems   Review of Systems: Negative Unless Checked Constitutional: [] Weight loss  [] Fever  [] Chills Cardiac: [] Chest pain   []  Atrial Fibrillation  [] Palpitations   [] Shortness of breath when laying flat   [] Shortness of breath with exertion. [] Shortness of breath at rest Vascular:  [x] Pain in legs with walking   [x] Pain in legs with standing [] Pain in legs when laying flat   [x] Claudication    [] Pain in feet when laying flat    [] History of DVT   [] Phlebitis   [] Swelling in legs   [x] Varicose veins   [] Non-healing ulcers Pulmonary:   [] Uses home oxygen   [] Productive cough   [] Hemoptysis   [] Wheeze  [x] COPD   [] Asthma Neurologic:  [] Dizziness   [] Seizures  [] Blackouts [] History of stroke   [] History of TIA  [] Aphasia   [] Temporary Blindness   [] Weakness or numbness in arm   [] Weakness or numbness in leg Musculoskeletal:   [] Joint swelling   [] Joint pain   [] Low back pain  []  History of Knee Replacement [] Arthritis [] back Surgeries  []  Spinal Stenosis    Hematologic:  [] Easy bruising  [] Easy bleeding   [] Hypercoagulable state   [] Anemic Gastrointestinal:  [] Diarrhea   [] Vomiting  [] Gastroesophageal  reflux/heartburn   [] Difficulty swallowing. [] Abdominal pain Genitourinary:  [] Chronic kidney disease   [] Difficult urination  [] Anuric   [] Blood in urine [] Frequent urination  [] Burning with urination   [] Hematuria Skin:  [] Rashes   [] Ulcers [] Wounds Psychological:  [] History of anxiety   []  History of major depression  []  Memory Difficulties      OBJECTIVE:   Physical Exam  BP 126/83 (BP Location: Left Arm, Patient Position: Sitting, Cuff Size: Small)   Pulse 86   Resp 10   Ht 5\' 8"  (1.727 m)   Wt 123 lb (55.8 kg)   BMI 18.70 kg/m   Gen: WD/WN, NAD Head: [place]/AT, No temporalis wasting.  Ear/Nose/Throat: Hearing grossly intact, nares w/o erythema or drainage Eyes: PER,  EOMI, sclera nonicteric.  Neck: Supple, no masses.  No JVD.  Pulmonary:  Good air movement, no use of accessory muscles.  Cardiac: RRR Vascular:  Vessel Right Left  Radial Palpable Palpable  Dorsalis Pedis Not Palpable Not Palpable  Posterior Tibial Not Palpable Palpable   Gastrointestinal: soft, non-distended. No guarding/no peritoneal signs.  Musculoskeletal: M/S 5/5 throughout.  No deformity or atrophy.  Neurologic: Pain and light touch intact in extremities.  Symmetrical.  Speech is fluent. Motor exam as listed above. Psychiatric: Judgment intact, Mood & affect appropriate for pt's clinical situation. Dermatologic: No Venous rashes. No Ulcers Noted.  No changes consistent with cellulitis. Lymph : No Cervical lymphadenopathy, no lichenification or skin changes of chronic lymphedema.       ASSESSMENT AND PLAN:  1. Hypertension, benign Continue antihypertensive medications as already ordered, these medications have been reviewed and there are no changes at this time.   2. Ischemic leg Recommend:  The patient has evidence of severe atherosclerotic changes of his right lower extremity with rest pain that is associated with preulcerative changes and impending tissue loss of the foot.  This represents a  limb threatening ischemia and places the patient at the risk for limb loss.  Patient should undergo angiography of the right lower extremity with the hope for intervention for limb salvage.  The risks and benefits as well as the alternative therapies was discussed in detail with the patient.  All questions were answered.  Patient agrees to proceed with angiography.  The patient will follow up with me in the office after the procedure.       3. Tobacco use Smoking cessation was discussed, 3-10 minutes spent on this topic specifically. The patient was counseled that it is absolutely critical that he stop smoking, because his interventions fail within weeks while he is smoking.   4. Other emphysema ([place]) Continue pulmonary medications and aerosols as already ordered, these medications have been reviewed and there are no changes at this time.     Current Outpatient Medications on File Prior to Visit  Medication Sig Dispense Refill  . apixaban (ELIQUIS) 5 MG TABS tablet Take 1 tablet (5 mg total) by mouth 2 (two) times daily. 180 tablet 3  . aspirin EC 81 MG tablet Take 1 tablet (81 mg total) by mouth daily. 90 tablet 3  . atorvastatin (LIPITOR) 20 MG tablet Take 1 tablet (20 mg total) by mouth daily. 90 tablet 3  . Cyanocobalamin (VITAMIN B-12) 1000 MCG SUBL Place 1 tablet (1,000 mcg total) under the tongue daily. 90 tablet 0  . Fluticasone-Umeclidin-Vilant (TRELEGY ELLIPTA) 100-62.5-25 MCG/INH AEPB Inhale 1 puff into the lungs daily. In place of Anoro 180 each 0  . lisinopril (PRINIVIL,ZESTRIL) 10 MG tablet Take 1 tablet (10 mg total) by mouth daily. 90 tablet 0  . nicotine (NICODERM CQ - DOSED IN MG/24 HOURS) 21 mg/24hr patch Place 1 patch (21 mg total) onto the skin daily. 28 patch 1  . nystatin-triamcinolone ointment (MYCOLOG) Apply 1 application topically 2 (two) times daily. 30 g 0  . gabapentin (NEURONTIN) 300 MG capsule Take 1 capsule (300 mg total) by mouth 2 (two) times daily for  30 days. (Patient not taking: Reported on 09/02/2018) 60 capsule 1  . HYDROcodone-acetaminophen (NORCO/VICODIN) 5-325 MG tablet Take 1 tablet by mouth every 6 (six) hours as needed for moderate pain or severe pain. (Patient not taking: Reported on 09/02/2018) 28 tablet 0   No current facility-administered medications on file prior to visit.  There are no Patient Instructions on file for this visit. No follow-ups on file.   Kris Hartmann, NP  This note was completed with Sales executive.  Any errors are purely unintentional.

## 2018-09-03 NOTE — Telephone Encounter (Signed)
Patient was called again and a message left to be NPO and arrive at the [facility] at 1:30 pm today. Patient returned my call and was given the pre-procedure instructions and his arrival time of 1:30 pm.

## 2018-09-03 NOTE — Op Note (Signed)
[facility]  Percutaneous Study/Intervention Procedural Note   Date of Surgery: 09/03/2018  Surgeon(s):Izaiha Lo    Assistants:none  Pre-operative Diagnosis: PAD with rest pain right lower extremity, acute on chronic ischemia with multiple previous interventions  Post-operative diagnosis:  Same  Procedure(s) Performed:             1.  Ultrasound guidance for vascular access left femoral artery             2.  Catheter placement into right anterior tibial artery from left femoral approach             3.  Aortogram and selective right lower extremity angiogram             4.   Catheter directed thrombolytic therapy with 6 mg of TPA instilled into the right SFA and popliteal arteries             5.   Mechanical thrombectomy with the penumbra cat 6 device to the right SFA, popliteal artery, and anterior tibial artery  6.  Placement of a 130 cm total length 50 cm working length catheter for continuous infusion of TPA overnight from the right proximal superficial femoral artery down to the right proximal anterior tibial artery             7.   Left femoral triple-lumen catheter placement with ultrasound and fluoroscopic guidance  EBL: 250 cc  Contrast: 50 cc  Fluoro Time: 7.3 minutes  Moderate Conscious Sedation Time: approximately 55 minutes using 4 mg of Versed and 100 mcg of Fentanyl              Indications:  Patient is a 63 y.o.male with recurrent ischemic symptoms to the right leg with rest pain.  He is undergone multiple previous interventions but continues to smoke and has acute limb threatening ischemia once more. The patient has noninvasive study showing occlusion of his previous interventions. The patient is brought in for angiography for further evaluation and potential treatment.  Due to the limb threatening nature of the situation, angiogram was performed for attempted limb salvage. The patient is aware that if the procedure fails, amputation would be  expected.  The patient also understands that even with successful revascularization, amputation may still be required due to the severity of the situation. Risks and benefits are discussed and informed consent is obtained.   Procedure:  The patient was identified and appropriate procedural time out was performed.  The patient was then placed supine on the table and prepped and draped in the usual sterile fashion. Moderate conscious sedation was administered during a face to face encounter with the patient throughout the procedure with my supervision of the RN administering medicines and monitoring the patient's vital signs, pulse oximetry, telemetry and mental status throughout from the start of the procedure until the patient was taken to the recovery room. Ultrasound was used to evaluate the left common femoral artery.  It was patent .  A digital ultrasound image was acquired.  A Seldinger needle was used to access the left common femoral artery under direct ultrasound guidance and a permanent image was performed.  A 0.035 J wire was advanced without resistance and a 5Fr sheath was placed.  Pigtail catheter was placed into the aorta and an AP aortogram was performed. This demonstrated normal renal arteries and normal aorta and iliac segments without significant stenosis. I then crossed the aortic bifurcation and advanced to the right femoral head. Selective right  lower extremity angiogram was then performed. This demonstrated flush occlusion of the right SFA with reconstitution of the mid posterior tibial and mid anterior tibial and peroneal arteries but occlusion of the popliteal artery and all 3 proximal tibial vessels. It was felt that it was in the patient's best interest to proceed with intervention after these images to avoid a second procedure and a larger amount of contrast and fluoroscopy based off of the findings from the initial angiogram. The patient was systemically heparinized and a 6 [place]  Destination sheath was then placed over the [employer] wire. I then used a Kumpe catheter and the advantage wire to navigate across the occlusion which was done without difficulty.  The catheter advanced into the anterior tibial artery over the advantage wire and then selective injection was performed to confirm intraluminal flow in the anterior tibial artery.  I then placed a 0.018 wire after instilling 8 mg of TPA in the right popliteal and superficial femoral arteries through the pigtail catheter with sideholes.  This was allowed to dwell for approximately 10 minutes and then I proceeded with thrombectomy.  Using the penumbra cat 6 device, multiple passes were made in the right SFA, popliteal artery, and anterior tibial artery.  Large amounts of thrombus were removed but there remained completely occlusive thrombosis.  He had now been having symptoms for several days and I felt that our best chance at limb salvage would be a continuous infusion of TPA overnight to try to remove the thrombus burden.  With this, I used a 130 cm total length 50 cm working length thrombolytic catheter and parked this with the distal tip in the anterior tibial artery proximally and the most proximal portion of the catheter was just outside of the sheath at the origin of the superficial femoral artery.  It was secured into place with Prolene sutures as was the sheath and continuous infusion of TPA will be given overnight.  To ensure that he had adequate venous access and allow lab draws without sticking him, a central line was placed.  As the left groin was already prepped the left femoral vein was visualized with ultrasound and found to be widely patent.  Was then accessed under direct ultrasound guidance without difficulty with a Seldinger needle and a permanent image was recorded.  The needle was removed after a J-wire was placed into the venous system.  This was parked into the IVC under fluoroscopy.  After skin nick and  dilatation a triple-lumen catheter was placed in to the vein over the wire and the wire was removed.  All 3 lm withdrew dark red blood and flushed easily with sterile saline.  The catheter tip was parked at the iliac confluence in the distal IVC with fluoroscopic guidance. I elected to terminate the procedure. The patient was taken to the recovery room in stable condition having tolerated the procedure well.  Findings:               Aortogram:  Normal renal arteries, aorta, and iliac arteries without significant stenosis             Right lower Extremity:  Flush occlusion of the right SFA with reconstitution of the mid posterior tibial and mid anterior tibial and peroneal arteries but occlusion of the popliteal artery and all 3 proximal tibial vessels.   Disposition: Patient was taken to the recovery room in stable condition having tolerated the procedure well.  Complications: None  Leotis Pain 09/03/2018 3:44 PM  This note was created with Dragon Medical transcription system. Any errors in dictation are purely unintentional.

## 2018-09-03 NOTE — Progress Notes (Signed)
DX ISCHEMIC LEG CONSULTED FOR POST OP PAIN  PLAN DILAUDID 1mg  every 1 hrs as needed PRECEDEX ordered IF NEEDED

## 2018-09-03 NOTE — Progress Notes (Signed)
Pt BP 213/103, no PRNs ordered. MD paged.

## 2018-09-04 ENCOUNTER — Encounter: Payer: Self-pay | Admitting: Vascular Surgery

## 2018-09-04 ENCOUNTER — Encounter
Admission: AD | Disposition: A | Discharge status: Home / Self Care | Payer: Self-pay | Source: Home / Self Care | Attending: Vascular Surgery

## 2018-09-04 DIAGNOSIS — I998 Other disorder of circulatory system: Secondary | ICD-10-CM

## 2018-09-04 DIAGNOSIS — Z9889 Other specified postprocedural states: Secondary | ICD-10-CM

## 2018-09-04 DIAGNOSIS — I70221 Atherosclerosis of native arteries of extremities with rest pain, right leg: Secondary | ICD-10-CM

## 2018-09-04 HISTORY — PX: LOWER EXTREMITY ANGIOGRAPHY: CATH118251

## 2018-09-04 LAB — CBC
HCT: 32.5 % — ABNORMAL LOW (ref 39.0–52.0)
Hemoglobin: 10.8 g/dL — ABNORMAL LOW (ref 13.0–17.0)
MCH: 31.7 pg (ref 26.0–34.0)
MCHC: 33.2 g/dL (ref 30.0–36.0)
MCV: 95.3 fL (ref 80.0–100.0)
Platelets: 186 10*3/uL (ref 150–400)
RBC: 3.41 MIL/uL — ABNORMAL LOW (ref 4.22–5.81)
RDW: 12.1 % (ref 11.5–15.5)
WBC: 7.2 10*3/uL (ref 4.0–10.5)
nRBC: 0 % (ref 0.0–0.2)

## 2018-09-04 LAB — BASIC METABOLIC PANEL
Anion gap: 6 (ref 5–15)
BUN: 18 mg/dL (ref 8–23)
CO2: 22 mmol/L (ref 22–32)
Calcium: 8.2 mg/dL — ABNORMAL LOW (ref 8.9–10.3)
Chloride: 110 mmol/L (ref 98–111)
Creatinine, Ser: 1.71 mg/dL — ABNORMAL HIGH (ref 0.61–1.24)
GFR calc Af Amer: 49 mL/min — ABNORMAL LOW (ref >60)
GFR calc non Af Amer: 42 mL/min — ABNORMAL LOW (ref >60)
Glucose, Bld: 116 mg/dL — ABNORMAL HIGH (ref 70–99)
Potassium: 4.1 mmol/L (ref 3.5–5.1)
Sodium: 138 mmol/L (ref 135–145)

## 2018-09-04 LAB — HEPARIN LEVEL (UNFRACTIONATED): Heparin Unfractionated: 0.39 IU/mL (ref 0.30–0.70)

## 2018-09-04 LAB — FIBRINOGEN: Fibrinogen: 384 mg/dL (ref 210–475)

## 2018-09-04 SURGERY — LOWER EXTREMITY ANGIOGRAPHY
Anesthesia: Moderate Sedation | Laterality: Right

## 2018-09-04 MED ORDER — FENTANYL CITRATE (PF) 100 MCG/2ML IJ SOLN
INTRAMUSCULAR | Status: AC
  Administered 2018-09-04: 08:00:00
  Filled 2018-09-04: qty 4

## 2018-09-04 MED ORDER — CEFAZOLIN SODIUM-DEXTROSE 2-4 GM/100ML-% IV SOLN
INTRAVENOUS | Status: AC
  Administered 2018-09-04: 08:00:00
  Filled 2018-09-04: qty 100

## 2018-09-04 MED ORDER — LIDOCAINE-EPINEPHRINE (PF) 1 %-1:200000 IJ SOLN
INTRAMUSCULAR | Status: AC
  Filled 2018-09-04: qty 30

## 2018-09-04 MED ORDER — APIXABAN 5 MG PO TABS
5.0000 mg | ORAL_TABLET | Freq: Two times a day (BID) | ORAL | Status: DC
  Administered 2018-09-05 – 2018-09-06 (×3): 5 mg via ORAL
  Filled 2018-09-04 (×3): qty 1

## 2018-09-04 MED ORDER — TIROFIBAN (AGGRASTAT) BOLUS VIA INFUSION
25.0000 ug/kg | Freq: Once | INTRAVENOUS | Status: AC
  Administered 2018-09-04: 1422.5 ug via INTRAVENOUS
  Filled 2018-09-04: qty 29

## 2018-09-04 MED ORDER — IOHEXOL 300 MG/ML  SOLN
INTRAMUSCULAR | Status: DC | PRN
  Administered 2018-09-04: 70 mL via INTRA_ARTERIAL

## 2018-09-04 MED ORDER — HEPARIN SODIUM (PORCINE) 1000 UNIT/ML IJ SOLN
INTRAMUSCULAR | Status: AC
  Filled 2018-09-04: qty 1

## 2018-09-04 MED ORDER — TIROFIBAN HCL IN NACL 5-0.9 MG/100ML-% IV SOLN
0.1500 ug/kg/min | INTRAVENOUS | Status: DC
  Administered 2018-09-04 – 2018-09-05 (×3): 0.15 ug/kg/min via INTRAVENOUS
  Filled 2018-09-04 (×7): qty 100

## 2018-09-04 MED ORDER — MIDAZOLAM HCL 2 MG/2ML IJ SOLN
INTRAMUSCULAR | Status: AC
  Administered 2018-09-04: 2 mg
  Filled 2018-09-04: qty 6

## 2018-09-04 SURGICAL SUPPLY — 13 items
BALLN ULTRVRSE 2.5X300X150 (BALLOONS) ×2
BALLN ULTRVRSE 3X100X150 (BALLOONS) ×2
BALLOON ULTRVRSE 2.5X300X150 (BALLOONS) ×1 IMPLANT
BALLOON ULTRVRSE 3X100X150 (BALLOONS) ×1 IMPLANT
CANISTER PENUMBRA ENGINE (MISCELLANEOUS) ×2 IMPLANT
CATH BEACON 5 .038 100 VERT TP (CATHETERS) ×2 IMPLANT
CATH INDIGO CAT6 KIT (CATHETERS) ×2 IMPLANT
DEVICE PRESTO INFLATION (MISCELLANEOUS) ×2 IMPLANT
DEVICE SAFEGUARD 24CM (GAUZE/BANDAGES/DRESSINGS) ×2 IMPLANT
DEVICE STARCLOSE SE CLOSURE (Vascular Products) ×2 IMPLANT
PACK ANGIOGRAPHY (CUSTOM PROCEDURE TRAY) ×2 IMPLANT
WIRE G V18X300CM (WIRE) ×2 IMPLANT
WIRE J 3MM .035X145CM (WIRE) ×2 IMPLANT

## 2018-09-04 NOTE — Plan of Care (Signed)

## 2018-09-04 NOTE — Op Note (Signed)
[facility]  Percutaneous Study/Intervention Procedural Note   Date of Surgery: 09/04/2018  Surgeon(s):DEW,JASON    Assistants:none  Pre-operative Diagnosis: PAD with rest pain right lower extremity, status post overnight thrombolytic therapy  Post-operative diagnosis:  Same  Procedure(s) Performed:             1.   Right lower extremity angiogram             2.  Catheter placement into right anterior tibial artery and right posterior tibial artery from left femoral approach             3.   Mechanical thrombectomy with the penumbra cat 6 device to the right SFA, popliteal artery, anterior tibial artery, tibioperoneal trunk, and posterior tibial arteries             4.  Percutaneous transluminal angioplasty of the anterior tibial artery with 2.5 mm diameter by 30 cm length angioplasty balloon             5.   Percutaneous transluminal angioplasty of the right tibioperoneal trunk and proximal posterior tibial artery with 3 mm diameter by 10 cm length angioplasty balloon  6.   StarClose closure device left femoral artery  EBL: 300 cc  Contrast: 70 cc  Fluoro Time: 6.6 minutes  Moderate Conscious Sedation Time: approximately 45 minutes using 3 mg of Versed and 75 Mcg of Fentanyl              Indications:  Patient is a 63 y.o.male with recurrent ischemia of the right lower extremity with rest pain.  He has been getting TPA overnight to try to debulk the clot in the right leg. The patient is brought in for angiography for further evaluation and potential treatment.  Due to the limb threatening nature of the situation, angiogram was performed for attempted limb salvage. The patient is aware that if the procedure fails, amputation would be expected.  The patient also understands that even with successful revascularization, amputation may still be required due to the severity of the situation.  Risks and benefits are discussed and informed consent is obtained.    Procedure:  The patient was identified and appropriate procedural time out was performed.  The patient was then placed supine on the table and prepped and draped in the usual sterile fashion. Moderate conscious sedation was administered during a face to face encounter with the patient throughout the procedure with my supervision of the RN administering medicines and monitoring the patient's vital signs, pulse oximetry, telemetry and mental status throughout from the start of the procedure until the patient was taken to the recovery room.  The existing thrombolytic catheter was removed over a V 18 wire.  Selective right lower extremity angiogram was then performed. This demonstrated continued occlusion of the right SFA, popliteal artery, and proximal portions of all 3 tibial vessels with an area of clear thrombus seen at the top of the previous SFA stent. It was felt that it was in the patient's best interest to proceed with intervention after these images to avoid a second procedure and a larger amount of contrast and fluoroscopy based off of the findings from the initial angiogram. The patient was systemically heparinized and I started by performing thrombectomy using the Cat 6 penumbra device.  We went from the proximal SFA down through the popliteal artery and into the anterior tibial artery.  Several passes were made.  A large chunk of thrombus was removed from the proximal  to mid SFA and was seen after flushing it from the catheter.  This restored patency to the SFA and popliteal arteries with less than 10% residual stenosis in these vessels.  There remained thrombus and stenosis causing flow limitation in the tibial vessels.  Another pass was made in the anterior tibial artery but the flow remained sluggish with multiple areas of high-grade residual stenosis and thrombus.  A 2.5 mm diameter by 30 cm length angioplasty balloon was then inflated from the anterior tibial artery origin down to the distal  anterior tibial artery and taken to 8 atm for 1 minute.  There was some spasm distally, but there was no greater than 30% residual stenosis down to the distal anterior tibial artery.  The flow in the foot was still sluggish.  The posterior tibial artery appeared to be the best runoff distally and so I turned my attention to this.  The wire was pulled back and a Kumpe catheter was advanced with the help of the V 18 wire through the thrombus and stenosis in the tibioperoneal trunk and proximal posterior tibial artery that remained near occlusive.  Intraluminal flow was confirmed beyond the lesion and from the mid to distal posterior tibial artery there was good flow without dynamically significant stenosis.  Passes with the penumbra cat 6 device were then performed in the posterior tibial artery and tibioperoneal trunk with a small amount of thrombus removed but a residual greater than 80% stenosis at the origin of the posterior tibial artery and the distal posterior tibial trunk.  I elected to perform angioplasty of the site.  A 3 mm diameter by 10 cm length angioplasty balloon was inflated in the right tibioperoneal trunk and proximal posterior tibial artery and taken to 10 atm for 1 minute.  Completion imaging showed less than 20% residual stenosis in this location with brisk flow into the foot through the posterior tibial artery which was the best runoff.  I elected to terminate the procedure. The sheath was removed and StarClose closure device was deployed in the left femoral artery with excellent hemostatic result. The patient was taken to the recovery room in stable condition having tolerated the procedure well.  Findings:                    Right Lower Extremity:  Continued occlusion of the right SFA, popliteal artery, and proximal portions of all 3 tibial vessels with an area of clear thrombus seen at the top of the previous SFA stent.   Disposition: Patient was taken to the recovery room in stable  condition having tolerated the procedure well.  Complications: None  Leotis Pain 09/04/2018 9:11 AM   This note was created with Dragon Medical transcription system. Any errors in dictation are purely unintentional.

## 2018-09-04 NOTE — H&P (Signed)
[facility] History & Physical Update  The patient was interviewed and re-examined.  The patient's previous History and Physical has been reviewed and is unchanged.  There is no change in the plan of care. We plan to proceed with the scheduled procedure.  Leotis Pain, MD  09/04/2018, 8:12 AM

## 2018-09-04 NOTE — Progress Notes (Signed)
7:55 Heparin and TPA stopped for the LE procedure.

## 2018-09-04 NOTE — Progress Notes (Signed)
Dr. Lucky Cowboy at bedside, speaking with pt. And his wife re: procedural results.

## 2018-09-04 NOTE — Progress Notes (Signed)
Underwent repeat revascularization procedure this morning.  Now on Aggrastat and has pulses in both feet.  Pain is adequately controlled.  PCCM is available as needed.  Merton Border, MD PCCM service Mobile [phone] Pager [phone] 09/04/2018 11:32 AM

## 2018-09-05 DIAGNOSIS — I998 Other disorder of circulatory system: Secondary | ICD-10-CM

## 2018-09-05 LAB — CBC
HEMATOCRIT: 25.3 % — AB (ref 39.0–52.0)
Hemoglobin: 8.5 g/dL — ABNORMAL LOW (ref 13.0–17.0)
MCH: 31.8 pg (ref 26.0–34.0)
MCHC: 33.6 g/dL (ref 30.0–36.0)
MCV: 94.8 fL (ref 80.0–100.0)
Platelets: 160 10*3/uL (ref 150–400)
RBC: 2.67 MIL/uL — ABNORMAL LOW (ref 4.22–5.81)
RDW: 12.2 % (ref 11.5–15.5)
WBC: 6.1 10*3/uL (ref 4.0–10.5)
nRBC: 0 % (ref 0.0–0.2)

## 2018-09-05 LAB — BASIC METABOLIC PANEL
ANION GAP: 7 (ref 5–15)
BUN: 16 mg/dL (ref 8–23)
CO2: 21 mmol/L — ABNORMAL LOW (ref 22–32)
Calcium: 8.2 mg/dL — ABNORMAL LOW (ref 8.9–10.3)
Chloride: 108 mmol/L (ref 98–111)
Creatinine, Ser: 1.58 mg/dL — ABNORMAL HIGH (ref 0.61–1.24)
GFR calc Af Amer: 54 mL/min — ABNORMAL LOW (ref >60)
GFR calc non Af Amer: 47 mL/min — ABNORMAL LOW (ref >60)
Glucose, Bld: 113 mg/dL — ABNORMAL HIGH (ref 70–99)
Potassium: 3.9 mmol/L (ref 3.5–5.1)
Sodium: 136 mmol/L (ref 135–145)

## 2018-09-05 MED ORDER — CHLORHEXIDINE GLUCONATE CLOTH 2 % EX PADS: 6.0000 | MEDICATED_PAD | Freq: Every day | CUTANEOUS | Status: DC

## 2018-09-05 MED ORDER — CLOPIDOGREL BISULFATE 75 MG PO TABS
75.0000 mg | ORAL_TABLET | Freq: Every day | ORAL | Status: DC
  Administered 2018-09-05 – 2018-09-06 (×2): 75 mg via ORAL
  Filled 2018-09-05 (×2): qty 1

## 2018-09-05 NOTE — Progress Notes (Signed)
Patient on Aggrastat drip throughout the night; minimal complaints of pain; femoral site is intact at a level 0. No bleeding or bruising noted at site. Patient hypertensive that resolves with PRN hydralazine; otherwise VSS, afebrile, pulses present in both feet and adequate urine output.

## 2018-09-05 NOTE — Progress Notes (Signed)
Subjective: Interval History: has no complaint of pain. His right leg feels well. He has some mild swelling but also it is warm. He has not been out of bed yet. He still has a catheter in place.  Objective: Vital signs in last 24 hours: Temp:  [97.8 F (36.6 C)-99.3 F (37.4 C)] 98 F (36.7 C) (03/14 0907) Pulse Rate:  [87-110] 88 (03/14 0907) Resp:  [11-26] 17 (03/14 0907) BP: (124-191)/(68-99) 144/80 (03/14 0907) SpO2:  [98 %-100 %] 100 % (03/14 0907)  Intake/Output from previous day: 03/13 0701 - 03/14 0700 In: 1955.7 [P.O.:360; I.V.:1595.7] Out: 1950 [Urine:1950] Intake/Output this shift: Total I/O In: 448.2 [I.V.:448.2] Out: 600 [Urine:600]  General appearance: alert, cooperative and appears stated age Extremities: edema 1+ and no cyanosis Pulses: palpable right DP pulse Skin: Skin color, texture, turgor normal. No rashes or lesions Neurologic: Grossly normal  Lab Results: Recent Labs    09/04/18 0331 09/05/18 0343  WBC 7.2 6.1  HGB 10.8* 8.5*  HCT 32.5* 25.3*  PLT 186 160   BMET Recent Labs    09/04/18 0331 09/05/18 0343  NA 138 136  K 4.1 3.9  CL 110 108  CO2 22 21*  GLUCOSE 116* 113*  BUN 18 16  CREATININE 1.71* 1.58*  CALCIUM 8.2* 8.2*    Studies/Results: Vas [place] Abi With/wo Tbi  Result Date: 09/04/2018 LOWER EXTREMITY DOPPLER STUDY Indications: Peripheral artery disease.  Vascular Interventions: 05/07/18: Right SFA/popliteal artery stent;                         05/26/2019: PTA Rt Tibial Peroneal trunk and Distal                         Popliteal Artery. Stent of Rt Popliteal Artery, SFA and                         Anterior Tibial Artery.                         07/01/18: Right SFA catheter;                         08/12/18: Right SFA, popliteal, TP trunk & peroneal                         artery thrombectomy/PTAs with right SFA stent;. Performing Technologist: Blondell Reveal RT, RDMS, RVT  Examination Guidelines: A complete evaluation includes at  minimum, Doppler waveform signals and systolic blood pressure reading at the level of bilateral brachial, anterior tibial, and posterior tibial arteries, when vessel segments are accessible. Bilateral testing is considered an integral part of a complete examination. Photoelectric Plethysmograph (PPG) waveforms and toe systolic pressure readings are included as required and additional duplex testing as needed. Limited examinations for reoccurring indications may be performed as noted.  ABI Findings: +---------+------------------+-----+-------------------+------------------+ Right    Rt Pressure (mmHg)IndexWaveform           Comment            +---------+------------------+-----+-------------------+------------------+ Brachial 143                                                          +---------+------------------+-----+-------------------+------------------+  ATA                             dampened monophasicdetected by duplex +---------+------------------+-----+-------------------+------------------+ PTA      55                0.38 dampened monophasic                   +---------+------------------+-----+-------------------+------------------+ PERO     45                0.31 dampened monophasic                   +---------+------------------+-----+-------------------+------------------+ Great Toe                       Not detected                          +---------+------------------+-----+-------------------+------------------+ +---------+------------------+-----+---------+-------+ Left     Lt Pressure (mmHg)IndexWaveform Comment +---------+------------------+-----+---------+-------+ Brachial 141                                     +---------+------------------+-----+---------+-------+ ATA                             absent           +---------+------------------+-----+---------+-------+ PTA      149               1.04 triphasic         +---------+------------------+-----+---------+-------+ PERO     146               1.02 triphasic        +---------+------------------+-----+---------+-------+ Great Toe108               0.76 Normal           +---------+------------------+-----+---------+-------+ +-------+-----------+------------+------------+-------------+ ABI/TBIToday's ABIToday's TBI Previous ABIPrevious TBI  +-------+-----------+------------+------------+-------------+ Right  0.38       not detected0.22        not performed +-------+-----------+------------+------------+-------------+ Left   1.04       0.76        1.24        not performed +-------+-----------+------------+------------+-------------+ LIMITED DUPLEX: The right SFA/popliteal artery stents and TP trunk appear occluded with collateral flow.  Bilateral ABIs appear essentially unchanged compared to prior study on 08/11/18 following last intervention.  Summary: Right: Resting right ankle-brachial index indicates severe right lower extremity arterial disease. No flow was adequately detected in the right great toe. Left: Resting left ankle-brachial index is within normal range. No evidence of significant left lower extremity arterial disease. The left toe-brachial index is abnormal.  *See table(s) above for measurements and observations.  Electronically signed by Leotis Pain MD on 09/04/2018 at 10:44:08 AM.    Final    Vas [place] Abi With/wo Tbi  Result Date: 08/11/2018 LOWER EXTREMITY DOPPLER STUDY Indications: Peripheral artery disease, and New severe pain to right leg.  Vascular Interventions: 05/07/18: Right SFA/popliteal artery stent;                         05/26/2019:  PTA Rt Tibial Peroneal trunk and Distal Popliteal                         Artery. Stent of Rt Popliteal Artery, SFA and Anterior                         Tibial Artery.                         07/01/2018 Right SFA catheter. Comparison Study: 07/13/2018 Performing  Technologist: Concha [place] RVT  Examination Guidelines: A complete evaluation includes at minimum, Doppler waveform signals and systolic blood pressure reading at the level of bilateral brachial, anterior tibial, and posterior tibial arteries, when vessel segments are accessible. Bilateral testing is considered an integral part of a complete examination. Photoelectric Plethysmograph (PPG) waveforms and toe systolic pressure readings are included as required and additional duplex testing as needed. Limited examinations for reoccurring indications may be performed as noted.  ABI Findings: +--------+------------------+-----+-------------------+--------+ Right   Rt Pressure (mmHg)IndexWaveform           Comment  +--------+------------------+-----+-------------------+--------+ Brachial140                                                +--------+------------------+-----+-------------------+--------+ ATA                            absent                      +--------+------------------+-----+-------------------+--------+ PTA     31                0.22 dampened monophasic         +--------+------------------+-----+-------------------+--------+ +--------+------------------+-----+---------+-------+ Left    Lt Pressure (mmHg)IndexWaveform Comment +--------+------------------+-----+---------+-------+ Brachial140                                     +--------+------------------+-----+---------+-------+ ATA     149               1.06 biphasic         +--------+------------------+-----+---------+-------+ PTA     174               1.24 triphasic        +--------+------------------+-----+---------+-------+ +-------+-----------+-----------+------------+------------+ ABI/TBIToday's ABIToday's TBIPrevious ABIPrevious TBI +-------+-----------+-----------+------------+------------+ Right  .22                   1.32                      +-------+-----------+-----------+------------+------------+ Left   1.24                  1.20                     +-------+-----------+-----------+------------+------------+ Right ABIs appear decreased compared to prior study on 07/13/2018.  Summary: Right: Resting right ankle-brachial index indicates critical limb ischemia. Additional imaging of right leg shows occlusion of mid SFA 3-4cm proximal to stent with occlusion extending throughout the stents in SFA and popliteal arteries. Left: Resting left ankle-brachial index is within normal range. No evidence of significant left lower extremity arterial  disease.  *See table(s) above for measurements and observations.  Electronically signed by Leotis Pain MD on 08/11/2018 at 5:03:19 PM.    Final    Anti-infectives: Anti-infectives (From admission, onward)   Start     Dose/Rate Route Frequency Ordered Stop   09/04/18 0754  ceFAZolin (ANCEF) 2-4 GM/100ML-% IVPB    Note to Pharmacy:  Fransico Michael   : cabinet override      09/04/18 0754 09/04/18 0815   09/03/18 1330  ceFAZolin (ANCEF) IVPB 2g/100 mL premix     2 g 200 mL/hr over 30 Minutes Intravenous  Once 09/03/18 1326 09/03/18 1503      Assessment/Plan: s/p Procedure(s): Lower Extremity Angiography (Right) Discontinue foley and femoral line. Mobilize today. Anticipate discharge home tomorrow. Ok to transfer to floor today.   LOS: 2 days   Juanna Cao 09/05/2018, 10:43 AM

## 2018-09-05 NOTE — Progress Notes (Signed)
Okay to transfer to [facility] floor per vascular surgery.  Order placed.  After transfer, PCCM will sign off. Please call if we can be of further assistance   Merton Border, MD PCCM service Mobile [phone] Pager [phone] 09/05/2018 10:54 AM

## 2018-09-06 DIAGNOSIS — I998 Other disorder of circulatory system: Secondary | ICD-10-CM

## 2018-09-06 LAB — CBC
HCT: 24.7 % — ABNORMAL LOW (ref 39.0–52.0)
Hemoglobin: 8.1 g/dL — ABNORMAL LOW (ref 13.0–17.0)
MCH: 31.2 pg (ref 26.0–34.0)
MCHC: 32.8 g/dL (ref 30.0–36.0)
MCV: 95 fL (ref 80.0–100.0)
NRBC: 0 % (ref 0.0–0.2)
PLATELETS: 169 10*3/uL (ref 150–400)
RBC: 2.6 MIL/uL — ABNORMAL LOW (ref 4.22–5.81)
RDW: 12 % (ref 11.5–15.5)
WBC: 5.1 10*3/uL (ref 4.0–10.5)

## 2018-09-06 LAB — MAGNESIUM: Magnesium: 0.3 mg/dL — CL (ref 1.7–2.4)

## 2018-09-06 LAB — BASIC METABOLIC PANEL
Anion gap: 6 (ref 5–15)
BUN: 14 mg/dL (ref 8–23)
CO2: 22 mmol/L (ref 22–32)
Calcium: 8.2 mg/dL — ABNORMAL LOW (ref 8.9–10.3)
Chloride: 111 mmol/L (ref 98–111)
Creatinine, Ser: 1.3 mg/dL — ABNORMAL HIGH (ref 0.61–1.24)
GFR calc Af Amer: >60 mL/min (ref >60)
GFR, EST NON AFRICAN AMERICAN: 59 mL/min — AB (ref >60)
Glucose, Bld: 105 mg/dL — ABNORMAL HIGH (ref 70–99)
Potassium: 4 mmol/L (ref 3.5–5.1)
Sodium: 139 mmol/L (ref 135–145)

## 2018-09-06 LAB — PHOSPHORUS: Phosphorus: 3.1 mg/dL (ref 2.5–4.6)

## 2018-09-06 LAB — GLUCOSE, CAPILLARY: Glucose-Capillary: 98 mg/dL (ref 70–99)

## 2018-09-06 MED ORDER — MAGNESIUM GLUCONATE 500 MG PO TABS: 500.0000 mg | ORAL_TABLET | Freq: Every day | ORAL | 0 refills | Status: DC

## 2018-09-06 MED ORDER — MAGNESIUM SULFATE 4 GM/100ML IV SOLN
4.0000 g | Freq: Once | INTRAVENOUS | Status: AC
  Administered 2018-09-06: 4 g via INTRAVENOUS
  Filled 2018-09-06: qty 100

## 2018-09-06 MED ORDER — MAGNESIUM GLUCONATE 500 MG PO TABS
500.0000 mg | ORAL_TABLET | Freq: Every day | ORAL | Status: DC
  Filled 2018-09-06: qty 1

## 2018-09-06 MED ORDER — ACETAMINOPHEN 325 MG PO TABS: 650.0000 mg | ORAL_TABLET | Freq: Four times a day (QID) | ORAL | 0 refills | Status: DC | PRN

## 2018-09-06 NOTE — Progress Notes (Addendum)
Upon discharge, patient's wife stating that her wallet is "missing". Stating that it was on the patient's bedside table and is no longer there. Assisted patients wife in looking through room laundry bin, trash, patients bedding, belonging bags with permission from patient and wife. Security, lost and found, and nursing supervisor notified. Wife initially described wallet as black, later described as a "blue bag with drawstring". Was unable to find in patient room. Patients wife stated that she only left the room to go to her care at midnight, but she refused to look in her car when I asked her to retrace her steps.   After discharging patient, wife returned to ICU stating that someone turned in the wallet to employee at the visitor entrance but money was missing. Called visitor entrance to speak with person at the desk and sent employee to entrance, but there was nobody present. Will submit SZP.

## 2018-09-06 NOTE — Progress Notes (Signed)
Patient is alert and oriented, awake at shift change and reporting no pain. Left groin site WNL, pedal pulses x 4 present. Vital Signs stable. Magnesium low, replaced IV. Vascular MD at bedside to discharge patient home. Will discharge per orders

## 2018-09-06 NOTE — Discharge Summary (Addendum)
Physician Discharge Summary  Patient ID: Ryan [name] MRN: [id number] DOB/AGE: 62-06-1955 62 y.o.  Admit date: 09/03/2018 Discharge date: 09/06/2018  Admission Diagnoses:  Discharge Diagnoses:  Active Problems:   Ischemic leg   Discharged Condition: good  Hospital Course: 63 year old man with history peripheral arterial disease who underwent percutaneous revascularization of an occluded SFA stent. He has done well and is now ready for discharge. He is tolerating a diet and urinated on his own.  Consults: pulmonary/intensive care  Significant Diagnostic Studies: angiography: occlusion of vessels subsequently revascularized.  Treatments: IV hydration and anticoagulation: ASA, heparin and Eliquis  Discharge Exam: Blood pressure 123/68, pulse (!) 104, temperature 98 F (36.7 C), temperature source Oral, resp. rate 19, height 5\' 8"  (1.727 m), weight 56.1 kg, SpO2 99 %. General appearance: alert, cooperative and appears stated age Head: Normocephalic, without obvious abnormality, atraumatic Extremities: edema 1+ and no cyanosis Pulses: 1+ right DP Skin: Skin color, texture, turgor normal. No rashes or lesions Neurologic: Grossly normal  Disposition: Discharge disposition: 01-Home or Self Care       Discharge Instructions    Call MD for:  redness, tenderness, or signs of infection (pain, swelling, bleeding, redness, odor or green/yellow discharge around incision site)   Complete by:  As directed    Call MD for:  severe or increased pain, loss or decreased feeling  in affected limb(s)   Complete by:  As directed    Call MD for:  temperature >100.5   Complete by:  As directed    Driving Restrictions   Complete by:  As directed    No driving for 3 days   Increase activity slowly   Complete by:  As directed    Walk with assistance use walker or cane as needed   Lifting restrictions   Complete by:  As directed    No lifting for 1 week   No dressing needed   Complete  by:  As directed    Replace only if drainage present   Resume previous diet   Complete by:  As directed      Allergies as of 09/06/2018   No Known Allergies     Medication List    TAKE these medications   acetaminophen 325 MG tablet Commonly known as:  TYLENOL Take 2 tablets (650 mg total) by mouth every 6 (six) hours as needed for mild pain (or Fever >/= 101).   apixaban 5 MG Tabs tablet Commonly known as:  Eliquis Take 1 tablet (5 mg total) by mouth 2 (two) times daily.   aspirin EC 81 MG tablet Take 1 tablet (81 mg total) by mouth daily.   atorvastatin 20 MG tablet Commonly known as:  LIPITOR Take 1 tablet (20 mg total) by mouth daily.   Fluticasone-Umeclidin-Vilant 100-62.5-25 MCG/INH Aepb Commonly known as:  Trelegy Ellipta Inhale 1 puff into the lungs daily. In place of Anoro   gabapentin 300 MG capsule Commonly known as:  NEURONTIN Take 1 capsule (300 mg total) by mouth 2 (two) times daily for 30 days.   HYDROcodone-acetaminophen 5-325 MG tablet Commonly known as:  NORCO/VICODIN Take 1 tablet by mouth every 6 (six) hours as needed for moderate pain or severe pain.   lisinopril 10 MG tablet Commonly known as:  PRINIVIL,ZESTRIL Take 1 tablet (10 mg total) by mouth daily.   magnesium gluconate 500 MG tablet Commonly known as:  MAGONATE Take 1 tablet (500 mg total) by mouth daily.   nicotine 21 mg/24hr patch  Commonly known as:  NICODERM CQ - dosed in mg/24 hours Place 1 patch (21 mg total) onto the skin daily.   nystatin-triamcinolone ointment Commonly known as:  MYCOLOG Apply 1 application topically 2 (two) times daily.   Vitamin B-12 1000 MCG Subl Place 1 tablet (1,000 mcg total) under the tongue daily.        Signed: Juanna Cao 09/06/2018, 9:54 AM

## 2018-09-06 NOTE — Progress Notes (Signed)
Patient stable throughout the night. Adequate urine output, no temperature, palpable pulses, and VS within normal limits.

## 2018-09-07 ENCOUNTER — Encounter: Payer: Self-pay | Admitting: Vascular Surgery

## 2018-09-10 ENCOUNTER — Telehealth (INDEPENDENT_AMBULATORY_CARE_PROVIDER_SITE_OTHER): Payer: Self-pay | Admitting: Nurse Practitioner

## 2018-09-10 NOTE — Telephone Encounter (Signed)
Please let Ryan [name] know that reopening blood flow to the leg after having so little for so long will generally result in swelling of the foot. He should elevate his foot when resting to decrease the swelling.  We can have him come in for follow up ABIs sometime next week.

## 2018-09-11 ENCOUNTER — Ambulatory Visit (INDEPENDENT_AMBULATORY_CARE_PROVIDER_SITE_OTHER): Payer: BLUE CROSS/BLUE SHIELD | Admitting: Nurse Practitioner

## 2018-09-11 ENCOUNTER — Encounter (INDEPENDENT_AMBULATORY_CARE_PROVIDER_SITE_OTHER): Payer: BLUE CROSS/BLUE SHIELD

## 2018-09-14 ENCOUNTER — Encounter (INDEPENDENT_AMBULATORY_CARE_PROVIDER_SITE_OTHER): Payer: Self-pay | Admitting: Nurse Practitioner

## 2018-09-14 ENCOUNTER — Other Ambulatory Visit (INDEPENDENT_AMBULATORY_CARE_PROVIDER_SITE_OTHER): Payer: Self-pay | Admitting: Vascular Surgery

## 2018-09-14 ENCOUNTER — Other Ambulatory Visit: Payer: Self-pay

## 2018-09-14 ENCOUNTER — Ambulatory Visit (INDEPENDENT_AMBULATORY_CARE_PROVIDER_SITE_OTHER): Payer: BLUE CROSS/BLUE SHIELD

## 2018-09-14 ENCOUNTER — Ambulatory Visit (INDEPENDENT_AMBULATORY_CARE_PROVIDER_SITE_OTHER): Payer: BLUE CROSS/BLUE SHIELD | Admitting: Nurse Practitioner

## 2018-09-14 VITALS — BP 179/98 | HR 98 | Resp 16 | Ht 68.0 in | Wt 129.0 lb

## 2018-09-14 DIAGNOSIS — I739 Peripheral vascular disease, unspecified: Secondary | ICD-10-CM | POA: Diagnosis not present

## 2018-09-14 DIAGNOSIS — Z720 Tobacco use: Secondary | ICD-10-CM | POA: Diagnosis not present

## 2018-09-14 DIAGNOSIS — I10 Essential (primary) hypertension: Secondary | ICD-10-CM | POA: Diagnosis not present

## 2018-09-14 DIAGNOSIS — Z9862 Peripheral vascular angioplasty status: Secondary | ICD-10-CM

## 2018-09-14 DIAGNOSIS — Z79899 Other long term (current) drug therapy: Secondary | ICD-10-CM | POA: Diagnosis not present

## 2018-09-18 ENCOUNTER — Encounter (INDEPENDENT_AMBULATORY_CARE_PROVIDER_SITE_OTHER): Payer: Self-pay | Admitting: Nurse Practitioner

## 2018-09-18 NOTE — Progress Notes (Signed)
SUBJECTIVE:  Patient ID: Ryan [name], male    DOB: Feb 18, 1956, 63 y.o.   MRN: [id number] Chief Complaint  Patient presents with  . Follow-up    HPI  Ryan [name] is a 63 y.o. male The patient returns to the office for followup and review of the noninvasive studies. There have been no interval changes in lower extremity symptoms. No interval shortening of the patient's claudication distance or development of rest pain symptoms. No new ulcers or wounds have occurred since the last visit.  There have been no significant changes to the patient's overall health care.  The patient denies amaurosis fugax or recent TIA symptoms. There are no recent neurological changes noted. The patient denies history of DVT, PE or superficial thrombophlebitis. The patient denies recent episodes of angina or shortness of breath.   ABI Rt=1.04 and Lt=1.15  (previous ABI's Rt=0.38 and Lt=1.04) Duplex ultrasound of the bilateral tibial arteries is triphasic with strong toe waveforms.  Past Medical History:  Diagnosis Date  . Hypertension   . Neuromuscular disorder (HCC)    numbness feet  . Peripheral vascular disease ([place])   . Shortness of breath dyspnea     Past Surgical History:  Procedure Laterality Date  . COLONOSCOPY WITH PROPOFOL N/A 02/15/2016   Procedure: COLONOSCOPY WITH PROPOFOL;  Surgeon: Lucilla Lame, MD;  Location: [facility];  Service: Endoscopy;  Laterality: N/A;  . HERNIA REPAIR  1999   left inguinal  . LOWER EXTREMITY ANGIOGRAPHY Right 05/07/2018   Procedure: LOWER EXTREMITY ANGIOGRAPHY;  Surgeon: Algernon Huxley, MD;  Location: [facility];  Service: Cardiovascular;  Laterality: Right;  . LOWER EXTREMITY ANGIOGRAPHY Right 06/25/2018   Procedure: LOWER EXTREMITY ANGIOGRAPHY;  Surgeon: Algernon Huxley, MD;  Location: [facility];  Service: Cardiovascular;  Laterality: Right;  . LOWER EXTREMITY ANGIOGRAPHY Right 07/01/2018   Procedure: LOWER EXTREMITY  ANGIOGRAPHY;  Surgeon: Algernon Huxley, MD;  Location: [facility];  Service: Cardiovascular;  Laterality: Right;  . LOWER EXTREMITY ANGIOGRAPHY Right 08/12/2018   Procedure: LOWER EXTREMITY ANGIOGRAPHY;  Surgeon: Algernon Huxley, MD;  Location: [facility];  Service: Cardiovascular;  Laterality: Right;  . LOWER EXTREMITY ANGIOGRAPHY Right 09/03/2018   Procedure: LOWER EXTREMITY ANGIOGRAPHY;  Surgeon: Algernon Huxley, MD;  Location: [facility];  Service: Cardiovascular;  Laterality: Right;  . LOWER EXTREMITY ANGIOGRAPHY Right 09/04/2018   Procedure: Lower Extremity Angiography;  Surgeon: Algernon Huxley, MD;  Location: [facility];  Service: Cardiovascular;  Laterality: Right;  . LOWER EXTREMITY INTERVENTION Right 07/02/2018   Procedure: LOWER EXTREMITY INTERVENTION;  Surgeon: Algernon Huxley, MD;  Location: [facility];  Service: Cardiovascular;  Laterality: Right;  . POLYPECTOMY N/A 02/15/2016   Procedure: POLYPECTOMY;  Surgeon: Lucilla Lame, MD;  Location: [facility];  Service: Endoscopy;  Laterality: N/A;    Social History   Socioeconomic History  . Marital status: Legally Separated    Spouse name: [name]  . Number of children: 5  . Years of education: Not on file  . Highest education level: High school graduate  Occupational History  . Occupation: [employer]    Comment: Ryan [name]  Social Needs  . Financial resource strain: Not hard at all  . Food insecurity:    Worry: Never true    Inability: Never true  . Transportation needs:    Medical: No    Non-medical: No  Tobacco Use  . Smoking status: Former  Smoker    Packs/day: 0.10    Years: 40.00    Pack years: 4.00    Types: Cigarettes    Start date: 07/21/1978    Last attempt to quit: 08/28/2018    Years since quitting: 0.0  . Smokeless tobacco: Never Used  Substance and Sexual Activity  . Alcohol use: Yes    Alcohol/week: 12.0 standard drinks    Types: 12 Cans of beer per week  . Drug use:  Never  . Sexual activity: Yes    Partners: Female    Birth control/protection: None  Lifestyle  . Physical activity:    Days per week: 6 days    Minutes per session: 60 min  . Stress: Not at all  Relationships  . Social connections:    Talks on phone: Three times a week    Gets together: Twice a week    Attends religious service: Never    Active member of club or organization: No    Attends meetings of clubs or organizations: Never    Relationship status: Married  . Intimate partner violence:    Fear of current or ex partner: No    Emotionally abused: No    Physically abused: No    Forced sexual activity: No  Other Topics Concern  . Not on file  Social History Narrative  . Not on file    Family History  Problem Relation Age of Onset  . COPD Mother   . Hypertension Father   . Heart attack Brother     No Known Allergies   Review of Systems   Review of Systems: Negative Unless Checked Constitutional: [] Weight loss  [] Fever  [] Chills Cardiac: [] Chest pain   []  Atrial Fibrillation  [] Palpitations   [] Shortness of breath when laying flat   [] Shortness of breath with exertion. [] Shortness of breath at rest Vascular:  [] Pain in legs with walking   [] Pain in legs with standing [] Pain in legs when laying flat   [] Claudication    [] Pain in feet when laying flat    [] History of DVT   [] Phlebitis   [x] Swelling in legs   [x] Varicose veins   [] Non-healing ulcers Pulmonary:   [] Uses home oxygen   [] Productive cough   [] Hemoptysis   [] Wheeze  [] COPD   [] Asthma Neurologic:  [] Dizziness   [] Seizures  [] Blackouts [] History of stroke   [] History of TIA  [] Aphasia   [] Temporary Blindness   [] Weakness or numbness in arm   [] Weakness or numbness in leg Musculoskeletal:   [] Joint swelling   [] Joint pain   [] Low back pain  []  History of Knee Replacement [] Arthritis [] back Surgeries  []  Spinal Stenosis    Hematologic:  [] Easy bruising  [] Easy bleeding   [] Hypercoagulable state   [] Anemic  Gastrointestinal:  [] Diarrhea   [] Vomiting  [] Gastroesophageal reflux/heartburn   [] Difficulty swallowing. [] Abdominal pain Genitourinary:  [] Chronic kidney disease   [] Difficult urination  [] Anuric   [] Blood in urine [] Frequent urination  [] Burning with urination   [] Hematuria Skin:  [] Rashes   [] Ulcers [] Wounds Psychological:  [] History of anxiety   []  History of major depression  []  Memory Difficulties      OBJECTIVE:   Physical Exam  BP (!) 179/98 (BP Location: Right Arm)   Pulse 98   Resp 16   Ht 5\' 8"  (1.727 m)   Wt 129 lb (58.5 kg)   BMI 19.61 kg/m   Gen: WD/WN, NAD Head: [place]/AT, No temporalis wasting.  Ear/Nose/Throat: Hearing grossly intact, nares w/o erythema  or drainage Eyes: PER, EOMI, sclera nonicteric.  Neck: Supple, no masses.  No JVD.  Pulmonary:  Good air movement, no use of accessory muscles.  Cardiac: RRR Vascular:  +1 edema on right lower extremity Vessel Right Left  Radial Palpable Palpable  Dorsalis Pedis Palpable Palpable  Posterior Tibial Palpable Palpable   Gastrointestinal: soft, non-distended. No guarding/no peritoneal signs.  Musculoskeletal: M/S 5/5 throughout.  No deformity or atrophy.  Neurologic: Pain and light touch intact in extremities.  Symmetrical.  Speech is fluent. Motor exam as listed above. Psychiatric: Judgment intact, Mood & affect appropriate for pt's clinical situation. Dermatologic: No Venous rashes. No Ulcers Noted.  No changes consistent with cellulitis. Lymph : No Cervical lymphadenopathy, no lichenification or skin changes of chronic lymphedema.       ASSESSMENT AND PLAN:  1. PAD (peripheral artery disease) (HCC) Recommend:  The patient is status post successful angiogram with intervention.  The patient reports that the claudication symptoms and leg pain is essentially gone.   The patient denies lifestyle limiting changes at this point in time.  No further invasive studies, angiography or surgery at this time The  patient should continue walking and begin a more formal exercise program.  The patient should continue antiplatelet therapy and aggressive treatment of the lipid abnormalities  Smoking cessation was again discussed  The patient should continue wearing graduated compression socks 10-15 mmHg strength to control the mild edema.  Patient should undergo noninvasive studies as ordered. The patient will follow up with me after the studies.    2. Tobacco use We have had multiple discussions with Mr. Alewine in regards to smoking and how this affects his peripheral arterial disease.  He has stop smoking at this time.  His abstinence was strongly encouraged from this point.  3. Hypertension, benign Continue antihypertensive medications as already ordered, these medications have been reviewed and there are no changes at this time.    Current Outpatient Medications on File Prior to Visit  Medication Sig Dispense Refill  . acetaminophen (TYLENOL) 325 MG tablet Take 2 tablets (650 mg total) by mouth every 6 (six) hours as needed for mild pain (or Fever >/= 101). 2 tablet 0  . apixaban (ELIQUIS) 5 MG TABS tablet Take 1 tablet (5 mg total) by mouth 2 (two) times daily. 180 tablet 3  . aspirin EC 81 MG tablet Take 1 tablet (81 mg total) by mouth daily. 90 tablet 3  . atorvastatin (LIPITOR) 20 MG tablet Take 1 tablet (20 mg total) by mouth daily. 90 tablet 3  . Cyanocobalamin (VITAMIN B-12) 1000 MCG SUBL Place 1 tablet (1,000 mcg total) under the tongue daily. 90 tablet 0  . Fluticasone-Umeclidin-Vilant (TRELEGY ELLIPTA) 100-62.5-25 MCG/INH AEPB Inhale 1 puff into the lungs daily. In place of Anoro 180 each 0  . lisinopril (PRINIVIL,ZESTRIL) 10 MG tablet Take 1 tablet (10 mg total) by mouth daily. 90 tablet 0  . magnesium gluconate (MAGONATE) 500 MG tablet Take 1 tablet (500 mg total) by mouth daily. 30 tablet 0  . nicotine (NICODERM CQ - DOSED IN MG/24 HOURS) 21 mg/24hr patch Place 1 patch (21 mg total)  onto the skin daily. 28 patch 1  . nystatin-triamcinolone ointment (MYCOLOG) Apply 1 application topically 2 (two) times daily. 30 g 0  . gabapentin (NEURONTIN) 300 MG capsule Take 1 capsule (300 mg total) by mouth 2 (two) times daily for 30 days. (Patient not taking: Reported on 09/02/2018) 60 capsule 1  . HYDROcodone-acetaminophen (NORCO/VICODIN) 5-325 MG tablet  Take 1 tablet by mouth every 6 (six) hours as needed for moderate pain or severe pain. (Patient not taking: Reported on 09/02/2018) 28 tablet 0   No current facility-administered medications on file prior to visit.     There are no Patient Instructions on file for this visit. No follow-ups on file.   Kris Hartmann, NP  This note was completed with Sales executive.  Any errors are purely unintentional.

## 2018-09-28 ENCOUNTER — Telehealth (INDEPENDENT_AMBULATORY_CARE_PROVIDER_SITE_OTHER): Payer: Self-pay | Admitting: Nurse Practitioner

## 2018-09-28 NOTE — Telephone Encounter (Signed)
Bring him in for ABIs

## 2018-09-28 NOTE — Telephone Encounter (Signed)
Patient is schedule to come in the office 09/29/18

## 2018-09-28 NOTE — Telephone Encounter (Signed)
Patient stated that he started having right leg pain and swelling on yesterday.He stated that he has been elevating and have taken tylenol and ibuprofen for pain

## 2018-09-28 NOTE — Telephone Encounter (Signed)
Patient was last seen on 3/11 and his procedure was on 3/12. He had u/s scheduled for 3/20 and it was canceled and rescheduled for 4/21 and canceled.

## 2018-09-29 ENCOUNTER — Encounter (INDEPENDENT_AMBULATORY_CARE_PROVIDER_SITE_OTHER): Payer: Self-pay

## 2018-09-29 ENCOUNTER — Encounter (INDEPENDENT_AMBULATORY_CARE_PROVIDER_SITE_OTHER): Payer: Self-pay | Admitting: Nurse Practitioner

## 2018-09-29 ENCOUNTER — Ambulatory Visit (INDEPENDENT_AMBULATORY_CARE_PROVIDER_SITE_OTHER): Payer: BLUE CROSS/BLUE SHIELD

## 2018-09-29 ENCOUNTER — Ambulatory Visit (INDEPENDENT_AMBULATORY_CARE_PROVIDER_SITE_OTHER): Payer: BLUE CROSS/BLUE SHIELD | Admitting: Nurse Practitioner

## 2018-09-29 ENCOUNTER — Other Ambulatory Visit: Payer: Self-pay

## 2018-09-29 VITALS — BP 142/91 | HR 74 | Resp 17 | Ht 68.0 in | Wt 126.0 lb

## 2018-09-29 DIAGNOSIS — I10 Essential (primary) hypertension: Secondary | ICD-10-CM | POA: Diagnosis not present

## 2018-09-29 DIAGNOSIS — Z720 Tobacco use: Secondary | ICD-10-CM

## 2018-09-29 DIAGNOSIS — I739 Peripheral vascular disease, unspecified: Secondary | ICD-10-CM | POA: Diagnosis not present

## 2018-09-29 DIAGNOSIS — Z79899 Other long term (current) drug therapy: Secondary | ICD-10-CM | POA: Diagnosis not present

## 2018-09-30 NOTE — Progress Notes (Signed)
SUBJECTIVE:  Patient ID: Ryan [name], male    DOB: 04-11-1956, 63 y.o.   MRN: [id number] Chief Complaint  Patient presents with  . Follow-up    ultrasound    HPI  Ryan [name] is a 63 y.o. male The patient returns to the office for followup and review of the noninvasive studies. There has been a significant deterioration in the lower extremity symptoms.  The patient notes interval shortening of their claudication distance and development of mild rest pain symptoms. No new ulcers or wounds have occurred since the last visit.  There have been no significant changes to the patient's overall health care.  The patient denies amaurosis fugax or recent TIA symptoms. There are no recent neurological changes noted. The patient denies history of DVT, PE or superficial thrombophlebitis. The patient denies recent episodes of angina or shortness of breath.   ABI's Rt=0.36 and Lt=1.17 (previous ABI's Rt=1.04 and Lt=1.15) Duplex US of the lower extremity arterial system shows monophasic waveforms of the right tibial arteries with weak toe waveforms.   The left tibial arteries have triphasic waveforms and strong toe waveforms.  Past Medical History:  Diagnosis Date  . Hypertension   . Neuromuscular disorder (HCC)    numbness feet  . Peripheral vascular disease ([place])   . Shortness of breath dyspnea     Past Surgical History:  Procedure Laterality Date  . COLONOSCOPY WITH PROPOFOL N/A 02/15/2016   Procedure: COLONOSCOPY WITH PROPOFOL;  Surgeon: Lucilla Lame, MD;  Location: [facility];  Service: Endoscopy;  Laterality: N/A;  . HERNIA REPAIR  1999   left inguinal  . LOWER EXTREMITY ANGIOGRAPHY Right 05/07/2018   Procedure: LOWER EXTREMITY ANGIOGRAPHY;  Surgeon: Algernon Huxley, MD;  Location: [facility];  Service: Cardiovascular;  Laterality: Right;  . LOWER EXTREMITY ANGIOGRAPHY Right 06/25/2018   Procedure: LOWER EXTREMITY ANGIOGRAPHY;  Surgeon: Algernon Huxley, MD;   Location: [facility];  Service: Cardiovascular;  Laterality: Right;  . LOWER EXTREMITY ANGIOGRAPHY Right 07/01/2018   Procedure: LOWER EXTREMITY ANGIOGRAPHY;  Surgeon: Algernon Huxley, MD;  Location: [facility];  Service: Cardiovascular;  Laterality: Right;  . LOWER EXTREMITY ANGIOGRAPHY Right 08/12/2018   Procedure: LOWER EXTREMITY ANGIOGRAPHY;  Surgeon: Algernon Huxley, MD;  Location: [place] [facility];  Service: Cardiovascular;  Laterality: Right;  . LOWER EXTREMITY ANGIOGRAPHY Right 09/03/2018   Procedure: LOWER EXTREMITY ANGIOGRAPHY;  Surgeon: Algernon Huxley, MD;  Location: [place] [facility];  Service: Cardiovascular;  Laterality: Right;  . LOWER EXTREMITY ANGIOGRAPHY Right 09/04/2018   Procedure: Lower Extremity Angiography;  Surgeon: Algernon Huxley, MD;  Location: [facility];  Service: Cardiovascular;  Laterality: Right;  . LOWER EXTREMITY INTERVENTION Right 07/02/2018   Procedure: LOWER EXTREMITY INTERVENTION;  Surgeon: Algernon Huxley, MD;  Location: [facility];  Service: Cardiovascular;  Laterality: Right;  . POLYPECTOMY N/A 02/15/2016   Procedure: POLYPECTOMY;  Surgeon: Lucilla Lame, MD;  Location: [facility];  Service: Endoscopy;  Laterality: N/A;    Social History   Socioeconomic History  . Marital status: Legally Separated    Spouse name: [name]  . Number of children: 5  . Years of education: Not on file  . Highest education level: High school graduate  Occupational History  . Occupation: Fork[employer]    Comment: [name]  Social Needs  . Financial resource strain: Not hard at all  . Food insecurity:    Worry: Never true  Inability: Never true  . Transportation needs:    Medical: No    Non-medical: No  Tobacco Use  . Smoking status: Former Smoker    Packs/day: 0.10    Years: 40.00    Pack years: 4.00    Types: Cigarettes    Start date: 07/21/1978    Last attempt to quit: 08/28/2018    Years since quitting: 0.0  . Smokeless  tobacco: Never Used  Substance and Sexual Activity  . Alcohol use: Yes    Alcohol/week: 12.0 standard drinks    Types: 12 Cans of beer per week  . Drug use: Never  . Sexual activity: Yes    Partners: Female    Birth control/protection: None  Lifestyle  . Physical activity:    Days per week: 6 days    Minutes per session: 60 min  . Stress: Not at all  Relationships  . Social connections:    Talks on phone: Three times a week    Gets together: Twice a week    Attends religious service: Never    Active member of club or organization: No    Attends meetings of clubs or organizations: Never    Relationship status: Married  . Intimate partner violence:    Fear of current or ex partner: No    Emotionally abused: No    Physically abused: No    Forced sexual activity: No  Other Topics Concern  . Not on file  Social History Narrative  . Not on file    Family History  Problem Relation Age of Onset  . COPD Mother   . Hypertension Father   . Heart attack Brother     No Known Allergies   Review of Systems   Review of Systems: Negative Unless Checked Constitutional: [] Weight loss  [] Fever  [] Chills Cardiac: [] Chest pain   []  Atrial Fibrillation  [] Palpitations   [] Shortness of breath when laying flat   [] Shortness of breath with exertion. [] Shortness of breath at rest Vascular:  [] Pain in legs with walking   [] Pain in legs with standing [] Pain in legs when laying flat   [x] Claudication    [] Pain in feet when laying flat    [] History of DVT   [] Phlebitis   [x] Swelling in legs   [] Varicose veins   [] Non-healing ulcers Pulmonary:   [] Uses home oxygen   [] Productive cough   [] Hemoptysis   [] Wheeze  [] COPD   [] Asthma Neurologic:  [] Dizziness   [] Seizures  [] Blackouts [] History of stroke   [] History of TIA  [] Aphasia   [] Temporary Blindness   [] Weakness or numbness in arm   [] Weakness or numbness in leg Musculoskeletal:   [] Joint swelling   [] Joint pain   [] Low back pain  []  History of  Knee Replacement [] Arthritis [] back Surgeries  []  Spinal Stenosis    Hematologic:  [] Easy bruising  [] Easy bleeding   [] Hypercoagulable state   [] Anemic Gastrointestinal:  [] Diarrhea   [] Vomiting  [] Gastroesophageal reflux/heartburn   [] Difficulty swallowing. [] Abdominal pain Genitourinary:  [] Chronic kidney disease   [] Difficult urination  [] Anuric   [] Blood in urine [] Frequent urination  [] Burning with urination   [] Hematuria Skin:  [] Rashes   [] Ulcers [] Wounds Psychological:  [] History of anxiety   []  History of major depression  []  Memory Difficulties      OBJECTIVE:   Physical Exam  BP (!) 142/91   Pulse 74   Resp 17   Ht 5\' 8"  (1.727 m)   Wt 126 lb (57.2 kg)  BMI 19.16 kg/m   Gen: WD/WN, NAD Head: [place]/AT, No temporalis wasting.  Ear/Nose/Throat: Hearing grossly intact, nares w/o erythema or drainage Eyes: PER, EOMI, sclera nonicteric.  Neck: Supple, no masses.  No JVD.  Pulmonary:  Good air movement, no use of accessory muscles.  Cardiac: RRR Vascular:  Vessel Right Left  Radial Palpable Palpable  Dorsalis Pedis Not Palpable Palpable  Posterior Tibial Not Palpable Palpable   Gastrointestinal: soft, non-distended. No guarding/no peritoneal signs.  Musculoskeletal: M/S 5/5 throughout.  No deformity or atrophy.  Neurologic: Pain and light touch intact in extremities.  Symmetrical.  Speech is fluent. Motor exam as listed above. Psychiatric: Judgment intact, Mood & affect appropriate for pt's clinical situation. Dermatologic: No Venous rashes. No Ulcers Noted.  No changes consistent with cellulitis. Lymph : No Cervical lymphadenopathy, no lichenification or skin changes of chronic lymphedema.       ASSESSMENT AND PLAN:  1. PAD (peripheral artery disease) (HCC) Recommend:  The patient has evidence of severe atherosclerotic changes of both lower extremities with rest pain that is associated with preulcerative changes and impending tissue loss of the foot.  This  represents a limb threatening ischemia and places the patient at the risk for limb loss.  Patient should undergo angiography of the lower extremities with the hope for intervention for limb salvage.  The risks and benefits as well as the alternative therapies was discussed in detail with the patient.  All questions were answered.  Patient agrees to proceed with angiography.  The patient will follow up with me in the office after the procedure.     Recommend:  The patient has evidence of severe atherosclerotic changes of both lower extremities with rest pain that is associated with preulcerative changes and impending tissue loss of the foot.  This represents a limb threatening ischemia and places the patient at the risk for limb loss.  Patient should undergo angiography of the lower extremities with the hope for intervention for limb salvage.  The risks and benefits as well as the alternative therapies was discussed in detail with the patient.  All questions were answered.  Patient agrees to proceed with angiography.  The patient will follow up with me in the office after the procedure.       2. Tobacco use Patient states that he continues to abstain at this time.  Abstinence encouraged.   3. Hypertension, benign Continue antihypertensive medications as already ordered, these medications have been reviewed and there are no changes at this time.    Current Outpatient Medications on File Prior to Visit  Medication Sig Dispense Refill  . acetaminophen (TYLENOL) 325 MG tablet Take 2 tablets (650 mg total) by mouth every 6 (six) hours as needed for mild pain (or Fever >/= 101). 2 tablet 0  . apixaban (ELIQUIS) 5 MG TABS tablet Take 1 tablet (5 mg total) by mouth 2 (two) times daily. 180 tablet 3  . aspirin EC 81 MG tablet Take 1 tablet (81 mg total) by mouth daily. 90 tablet 3  . atorvastatin (LIPITOR) 20 MG tablet Take 1 tablet (20 mg total) by mouth daily. 90 tablet 3  . Cyanocobalamin  (VITAMIN B-12) 1000 MCG SUBL Place 1 tablet (1,000 mcg total) under the tongue daily. 90 tablet 0  . Fluticasone-Umeclidin-Vilant (TRELEGY ELLIPTA) 100-62.5-25 MCG/INH AEPB Inhale 1 puff into the lungs daily. In place of Anoro 180 each 0  . lisinopril (PRINIVIL,ZESTRIL) 10 MG tablet Take 1 tablet (10 mg total) by mouth daily. 90 tablet  0  . nicotine (NICODERM CQ - DOSED IN MG/24 HOURS) 21 mg/24hr patch Place 1 patch (21 mg total) onto the skin daily. 28 patch 1  . gabapentin (NEURONTIN) 300 MG capsule Take 1 capsule (300 mg total) by mouth 2 (two) times daily for 30 days. (Patient not taking: Reported on 09/02/2018) 60 capsule 1  . HYDROcodone-acetaminophen (NORCO/VICODIN) 5-325 MG tablet Take 1 tablet by mouth every 6 (six) hours as needed for moderate pain or severe pain. (Patient not taking: Reported on 09/02/2018) 28 tablet 0  . magnesium gluconate (MAGONATE) 500 MG tablet Take 1 tablet (500 mg total) by mouth daily. (Patient not taking: Reported on 09/29/2018) 30 tablet 0  . nystatin-triamcinolone ointment (MYCOLOG) Apply 1 application topically 2 (two) times daily. (Patient not taking: Reported on 09/29/2018) 30 g 0   No current facility-administered medications on file prior to visit.     There are no Patient Instructions on file for this visit. No follow-ups on file.   Kris Hartmann, NP  This note was completed with Sales executive.  Any errors are purely unintentional.

## 2018-10-01 ENCOUNTER — Encounter
Admission: RE | Disposition: A | Discharge status: Home / Self Care | Payer: Self-pay | Source: Home / Self Care | Attending: Vascular Surgery

## 2018-10-01 ENCOUNTER — Observation Stay
Admission: RE | Admit: 2018-10-01 | Discharge: 2018-10-02 | Disposition: A | Discharge status: Home / Self Care | Payer: BLUE CROSS/BLUE SHIELD | Attending: Vascular Surgery | Admitting: Vascular Surgery

## 2018-10-01 ENCOUNTER — Other Ambulatory Visit: Payer: Self-pay

## 2018-10-01 DIAGNOSIS — Z720 Tobacco use: Secondary | ICD-10-CM | POA: Insufficient documentation

## 2018-10-01 DIAGNOSIS — I998 Other disorder of circulatory system: Secondary | ICD-10-CM | POA: Diagnosis present

## 2018-10-01 DIAGNOSIS — Z7901 Long term (current) use of anticoagulants: Secondary | ICD-10-CM | POA: Insufficient documentation

## 2018-10-01 DIAGNOSIS — Z79899 Other long term (current) drug therapy: Secondary | ICD-10-CM | POA: Insufficient documentation

## 2018-10-01 DIAGNOSIS — I10 Essential (primary) hypertension: Secondary | ICD-10-CM | POA: Insufficient documentation

## 2018-10-01 DIAGNOSIS — G709 Myoneural disorder, unspecified: Secondary | ICD-10-CM | POA: Insufficient documentation

## 2018-10-01 DIAGNOSIS — I739 Peripheral vascular disease, unspecified: Secondary | ICD-10-CM | POA: Diagnosis not present

## 2018-10-01 DIAGNOSIS — M79671 Pain in right foot: Secondary | ICD-10-CM | POA: Diagnosis not present

## 2018-10-01 DIAGNOSIS — I70221 Atherosclerosis of native arteries of extremities with rest pain, right leg: Secondary | ICD-10-CM

## 2018-10-01 DIAGNOSIS — I70229 Atherosclerosis of native arteries of extremities with rest pain, unspecified extremity: Secondary | ICD-10-CM

## 2018-10-01 DIAGNOSIS — Z95820 Peripheral vascular angioplasty status with implants and grafts: Secondary | ICD-10-CM

## 2018-10-01 DIAGNOSIS — T82856A Stenosis of peripheral vascular stent, initial encounter: Secondary | ICD-10-CM | POA: Diagnosis not present

## 2018-10-01 HISTORY — PX: LOWER EXTREMITY ANGIOGRAPHY: CATH118251

## 2018-10-01 LAB — CREATININE, SERUM
Creatinine, Ser: 1.89 mg/dL — ABNORMAL HIGH (ref 0.61–1.24)
GFR calc Af Amer: 43 mL/min — ABNORMAL LOW (ref >60)
GFR calc non Af Amer: 37 mL/min — ABNORMAL LOW (ref >60)

## 2018-10-01 LAB — BUN: BUN: 22 mg/dL (ref 8–23)

## 2018-10-01 SURGERY — LOWER EXTREMITY ANGIOGRAPHY
Anesthesia: Moderate Sedation | Laterality: Right

## 2018-10-01 SURGERY — LOWER EXTREMITY ANGIOGRAPHY
Anesthesia: Moderate Sedation | Site: Leg Lower | Laterality: Right

## 2018-10-01 MED ORDER — TIROFIBAN HCL IV 12.5 MG/250 ML
INTRAVENOUS | Status: AC
  Administered 2018-10-01: 0.15 ug/kg/min via INTRAVENOUS
  Filled 2018-10-01: qty 250

## 2018-10-01 MED ORDER — FLUTICASONE-UMECLIDIN-VILANT 100-62.5-25 MCG/INH IN AEPB: 1.0000 | INHALATION_SPRAY | Freq: Every day | RESPIRATORY_TRACT | Status: DC

## 2018-10-01 MED ORDER — LIDOCAINE VISCOUS HCL 2 % MT SOLN
OROMUCOSAL | Status: AC
  Filled 2018-10-01: qty 15

## 2018-10-01 MED ORDER — CEFAZOLIN SODIUM-DEXTROSE 2-4 GM/100ML-% IV SOLN
INTRAVENOUS | Status: AC
  Filled 2018-10-01: qty 100

## 2018-10-01 MED ORDER — ONDANSETRON HCL 4 MG/2ML IJ SOLN: 4.0000 mg | Freq: Four times a day (QID) | INTRAMUSCULAR | Status: DC | PRN

## 2018-10-01 MED ORDER — LABETALOL HCL 5 MG/ML IV SOLN: 10.0000 mg | Freq: Once | INTRAVENOUS | Status: DC

## 2018-10-01 MED ORDER — TIROFIBAN HCL IV 12.5 MG/250 ML
0.0750 ug/kg/min | INTRAVENOUS | Status: DC
  Administered 2018-10-01: 0.075 ug/kg/min via INTRAVENOUS
  Filled 2018-10-01: qty 250

## 2018-10-01 MED ORDER — ACETAMINOPHEN 650 MG RE SUPP: 650.0000 mg | Freq: Four times a day (QID) | RECTAL | Status: DC | PRN

## 2018-10-01 MED ORDER — FLUTICASONE FUROATE-VILANTEROL 100-25 MCG/INH IN AEPB
1.0000 | INHALATION_SPRAY | Freq: Every day | RESPIRATORY_TRACT | Status: DC
  Administered 2018-10-02: 1 via RESPIRATORY_TRACT
  Filled 2018-10-01: qty 28

## 2018-10-01 MED ORDER — HYDROCODONE-ACETAMINOPHEN 5-325 MG PO TABS: 1.0000 | ORAL_TABLET | Freq: Four times a day (QID) | ORAL | Status: DC | PRN

## 2018-10-01 MED ORDER — FENTANYL CITRATE (PF) 100 MCG/2ML IJ SOLN
INTRAMUSCULAR | Status: AC
  Filled 2018-10-01: qty 2

## 2018-10-01 MED ORDER — HEPARIN (PORCINE) IN NACL 1000-0.9 UT/500ML-% IV SOLN
INTRAVENOUS | Status: AC
  Filled 2018-10-01: qty 1000

## 2018-10-01 MED ORDER — METHYLPREDNISOLONE SODIUM SUCC 125 MG IJ SOLR: 125.0000 mg | Freq: Once | INTRAMUSCULAR | Status: DC | PRN

## 2018-10-01 MED ORDER — HEPARIN SODIUM (PORCINE) 1000 UNIT/ML IJ SOLN
INTRAMUSCULAR | Status: DC | PRN
  Administered 2018-10-01: 4000 [IU] via INTRAVENOUS

## 2018-10-01 MED ORDER — CEFAZOLIN SODIUM-DEXTROSE 2-4 GM/100ML-% IV SOLN
2.0000 g | Freq: Once | INTRAVENOUS | Status: AC
  Administered 2018-10-01: 2 g via INTRAVENOUS

## 2018-10-01 MED ORDER — TIROFIBAN HCL IN NACL 5-0.9 MG/100ML-% IV SOLN
0.1500 ug/kg/min | INTRAVENOUS | Status: DC
  Administered 2018-10-01: 17:00:00 0.15 ug/kg/min via INTRAVENOUS

## 2018-10-01 MED ORDER — NITROGLYCERIN 1 MG/10 ML FOR IR/CATH LAB
INTRA_ARTERIAL | Status: DC | PRN
  Administered 2018-10-01 (×2): 250 ug via INTRA_ARTERIAL

## 2018-10-01 MED ORDER — MAGNESIUM GLUCONATE 500 MG PO TABS
500.0000 mg | ORAL_TABLET | Freq: Every day | ORAL | Status: DC
  Administered 2018-10-02: 500 mg via ORAL
  Filled 2018-10-01 (×2): qty 1

## 2018-10-01 MED ORDER — HYDROMORPHONE HCL 1 MG/ML IJ SOLN
1.0000 mg | Freq: Once | INTRAMUSCULAR | Status: AC | PRN
  Administered 2018-10-01: 12:00:00 1 mg via INTRAVENOUS

## 2018-10-01 MED ORDER — HYDRALAZINE HCL 20 MG/ML IJ SOLN
INTRAMUSCULAR | Status: AC
  Filled 2018-10-01: qty 1

## 2018-10-01 MED ORDER — HYDROCODONE-ACETAMINOPHEN 5-325 MG PO TABS: 1.0000 | ORAL_TABLET | ORAL | Status: DC | PRN

## 2018-10-01 MED ORDER — SODIUM CHLORIDE 0.9 % IV SOLN: 250.0000 mL | INTRAVENOUS | Status: DC | PRN

## 2018-10-01 MED ORDER — HEPARIN (PORCINE) 25000 UT/250ML-% IV SOLN
800.0000 [IU]/h | INTRAVENOUS | Status: DC
  Administered 2018-10-01: 11:00:00 800 [IU]/h via INTRAVENOUS

## 2018-10-01 MED ORDER — MIDAZOLAM HCL 2 MG/2ML IJ SOLN
INTRAMUSCULAR | Status: DC | PRN
  Administered 2018-10-01 (×2): 2 mg via INTRAVENOUS

## 2018-10-01 MED ORDER — MAGNESIUM HYDROXIDE 400 MG/5ML PO SUSP: 30.0000 mL | Freq: Every day | ORAL | Status: DC | PRN

## 2018-10-01 MED ORDER — HYDRALAZINE HCL 20 MG/ML IJ SOLN
10.0000 mg | Freq: Once | INTRAMUSCULAR | Status: AC
  Administered 2018-10-01: 10 mg via INTRAVENOUS

## 2018-10-01 MED ORDER — SODIUM CHLORIDE 0.9 % IV SOLN
INTRAVENOUS | Status: DC
  Administered 2018-10-01: 09:00:00 via INTRAVENOUS

## 2018-10-01 MED ORDER — NITROGLYCERIN 5 MG/ML IV SOLN
INTRAVENOUS | Status: AC
  Filled 2018-10-01: qty 10

## 2018-10-01 MED ORDER — DOCUSATE SODIUM 100 MG PO CAPS
100.0000 mg | ORAL_CAPSULE | Freq: Two times a day (BID) | ORAL | Status: DC
  Administered 2018-10-01 – 2018-10-02 (×2): 100 mg via ORAL
  Filled 2018-10-01 (×2): qty 1

## 2018-10-01 MED ORDER — IOHEXOL 300 MG/ML  SOLN
INTRAMUSCULAR | Status: DC | PRN
  Administered 2018-10-01: 11:00:00 30 mL via INTRAVENOUS

## 2018-10-01 MED ORDER — VITAMIN B-12 1000 MCG PO TABS
1000.0000 ug | ORAL_TABLET | Freq: Every day | ORAL | Status: DC
  Administered 2018-10-02: 1000 ug via ORAL
  Filled 2018-10-01: qty 1

## 2018-10-01 MED ORDER — SODIUM CHLORIDE 0.9% FLUSH: 3.0000 mL | INTRAVENOUS | Status: DC | PRN

## 2018-10-01 MED ORDER — SODIUM CHLORIDE 0.9% FLUSH
3.0000 mL | Freq: Two times a day (BID) | INTRAVENOUS | Status: DC
  Administered 2018-10-01: 3 mL via INTRAVENOUS

## 2018-10-01 MED ORDER — FLEET ENEMA 7-19 GM/118ML RE ENEM: 1.0000 | ENEMA | Freq: Once | RECTAL | Status: DC | PRN

## 2018-10-01 MED ORDER — LISINOPRIL 10 MG PO TABS
10.0000 mg | ORAL_TABLET | Freq: Every day | ORAL | Status: DC
  Administered 2018-10-02: 10 mg via ORAL
  Filled 2018-10-01: qty 1

## 2018-10-01 MED ORDER — HEPARIN SODIUM (PORCINE) 1000 UNIT/ML IJ SOLN
INTRAMUSCULAR | Status: AC
  Filled 2018-10-01: qty 1

## 2018-10-01 MED ORDER — ASPIRIN EC 81 MG PO TBEC
81.0000 mg | DELAYED_RELEASE_TABLET | Freq: Every day | ORAL | Status: DC
  Administered 2018-10-02: 81 mg via ORAL
  Filled 2018-10-01: qty 1

## 2018-10-01 MED ORDER — LIDOCAINE HCL (PF) 1 % IJ SOLN
INTRAMUSCULAR | Status: AC
  Filled 2018-10-01: qty 30

## 2018-10-01 MED ORDER — ALTEPLASE 2 MG IJ SOLR
INTRAMUSCULAR | Status: AC
  Filled 2018-10-01: qty 10

## 2018-10-01 MED ORDER — HEPARIN SODIUM (PORCINE) 1000 UNIT/ML IJ SOLN
INTRAMUSCULAR | Status: DC | PRN
  Administered 2018-10-01: 2000 [IU] via INTRAVENOUS
  Administered 2018-10-01: 3000 [IU] via INTRAVENOUS

## 2018-10-01 MED ORDER — FENTANYL CITRATE (PF) 100 MCG/2ML IJ SOLN
INTRAMUSCULAR | Status: DC | PRN
  Administered 2018-10-01: 50 ug via INTRAVENOUS

## 2018-10-01 MED ORDER — HYDROMORPHONE HCL 1 MG/ML IJ SOLN
INTRAMUSCULAR | Status: AC
  Filled 2018-10-01: qty 0.5

## 2018-10-01 MED ORDER — TIROFIBAN HCL 3.75 MG/15ML IV CONC: 250.0000 ug | Freq: Once | INTRAVENOUS | Status: DC

## 2018-10-01 MED ORDER — ACETAMINOPHEN 325 MG PO TABS: 650.0000 mg | ORAL_TABLET | Freq: Four times a day (QID) | ORAL | Status: DC | PRN

## 2018-10-01 MED ORDER — FENTANYL CITRATE (PF) 100 MCG/2ML IJ SOLN
INTRAMUSCULAR | Status: DC | PRN
  Administered 2018-10-01 (×2): 50 ug via INTRAVENOUS

## 2018-10-01 MED ORDER — LABETALOL HCL 5 MG/ML IV SOLN
INTRAVENOUS | Status: AC
  Administered 2018-10-01: 10 mg
  Filled 2018-10-01: qty 4

## 2018-10-01 MED ORDER — GABAPENTIN 300 MG PO CAPS
300.0000 mg | ORAL_CAPSULE | Freq: Two times a day (BID) | ORAL | Status: DC
  Administered 2018-10-01 – 2018-10-02 (×2): 300 mg via ORAL
  Filled 2018-10-01 (×2): qty 1

## 2018-10-01 MED ORDER — MORPHINE SULFATE (PF) 4 MG/ML IV SOLN
5.0000 mg | INTRAVENOUS | Status: DC | PRN
  Administered 2018-10-01: 4 mg via INTRAVENOUS
  Administered 2018-10-02: 5 mg via INTRAVENOUS
  Filled 2018-10-01: qty 2

## 2018-10-01 MED ORDER — MORPHINE SULFATE (PF) 2 MG/ML IV SOLN
INTRAVENOUS | Status: AC
  Filled 2018-10-01: qty 2

## 2018-10-01 MED ORDER — SODIUM CHLORIDE FLUSH 0.9 % IV SOLN
INTRAVENOUS | Status: AC
  Filled 2018-10-01: qty 50

## 2018-10-01 MED ORDER — TIROFIBAN (AGGRASTAT) BOLUS VIA INFUSION
25.0000 ug/kg | Freq: Once | INTRAVENOUS | Status: AC
  Administered 2018-10-01: 12500 ug via INTRAVENOUS

## 2018-10-01 MED ORDER — ONDANSETRON HCL 4 MG/2ML IJ SOLN
4.0000 mg | Freq: Four times a day (QID) | INTRAMUSCULAR | Status: DC | PRN
  Administered 2018-10-02: 4 mg via INTRAVENOUS
  Filled 2018-10-01: qty 2

## 2018-10-01 MED ORDER — DEXTROSE-NACL 5-0.9 % IV SOLN
INTRAVENOUS | Status: DC
  Administered 2018-10-01 – 2018-10-02 (×2): via INTRAVENOUS

## 2018-10-01 MED ORDER — TIROFIBAN HCL IN NACL 5-0.9 MG/100ML-% IV SOLN: 0.1500 ug/kg/min | INTRAVENOUS | Status: DC

## 2018-10-01 MED ORDER — MIDAZOLAM HCL 2 MG/2ML IJ SOLN: 1.0000 mg | INTRAMUSCULAR | Status: DC | PRN

## 2018-10-01 MED ORDER — APIXABAN 5 MG PO TABS
5.0000 mg | ORAL_TABLET | Freq: Two times a day (BID) | ORAL | Status: DC
  Administered 2018-10-02: 5 mg via ORAL
  Filled 2018-10-01: qty 1

## 2018-10-01 MED ORDER — UMECLIDINIUM BROMIDE 62.5 MCG/INH IN AEPB
1.0000 | INHALATION_SPRAY | Freq: Every day | RESPIRATORY_TRACT | Status: DC
  Administered 2018-10-02: 1 via RESPIRATORY_TRACT
  Filled 2018-10-01: qty 7

## 2018-10-01 MED ORDER — HYDROCODONE-ACETAMINOPHEN 5-325 MG PO TABS: 1.0000 | ORAL_TABLET | Freq: Four times a day (QID) | ORAL | 0 refills | Status: DC | PRN

## 2018-10-01 MED ORDER — SORBITOL 70 % SOLN: 30.0000 mL | Freq: Every day | Status: DC | PRN

## 2018-10-01 MED ORDER — ASPIRIN EC 81 MG PO TBEC: 81.0000 mg | DELAYED_RELEASE_TABLET | Freq: Every day | ORAL | Status: DC

## 2018-10-01 MED ORDER — DIPHENHYDRAMINE HCL 50 MG/ML IJ SOLN: 50.0000 mg | Freq: Once | INTRAMUSCULAR | Status: DC | PRN

## 2018-10-01 MED ORDER — FAMOTIDINE 20 MG PO TABS: 40.0000 mg | ORAL_TABLET | Freq: Once | ORAL | Status: DC | PRN

## 2018-10-01 MED ORDER — ONDANSETRON HCL 4 MG PO TABS: 4.0000 mg | ORAL_TABLET | Freq: Four times a day (QID) | ORAL | Status: DC | PRN

## 2018-10-01 MED ORDER — MIDAZOLAM HCL 5 MG/5ML IJ SOLN
INTRAMUSCULAR | Status: AC
  Filled 2018-10-01: qty 5

## 2018-10-01 MED ORDER — MIDAZOLAM HCL 2 MG/2ML IJ SOLN
INTRAMUSCULAR | Status: DC | PRN
  Administered 2018-10-01: 2 mg via INTRAVENOUS

## 2018-10-01 MED ORDER — HEPARIN (PORCINE) 25000 UT/250ML-% IV SOLN
INTRAVENOUS | Status: AC
  Administered 2018-10-01: 800 [IU]/h via INTRAVENOUS
  Filled 2018-10-01: qty 250

## 2018-10-01 MED ORDER — SODIUM CHLORIDE (PF) 0.9 % IJ SOLN
INTRAMUSCULAR | Status: AC
  Filled 2018-10-01: qty 50

## 2018-10-01 MED ORDER — MIDAZOLAM HCL 2 MG/ML PO SYRP: 8.0000 mg | ORAL_SOLUTION | Freq: Once | ORAL | Status: DC | PRN

## 2018-10-01 MED ORDER — ATORVASTATIN CALCIUM 20 MG PO TABS
20.0000 mg | ORAL_TABLET | Freq: Every day | ORAL | Status: DC
  Administered 2018-10-02: 20 mg via ORAL
  Filled 2018-10-01: qty 1

## 2018-10-01 MED ORDER — SODIUM CHLORIDE 0.9 % IV SOLN
2.0000 mg/h | INTRAVENOUS | Status: DC
  Administered 2018-10-01: 2 mg/h
  Filled 2018-10-01 (×10): qty 10

## 2018-10-01 MED ORDER — SODIUM CHLORIDE 0.9 % IV BOLUS
500.0000 mL | Freq: Once | INTRAVENOUS | Status: AC
  Administered 2018-10-01: 500 mL via INTRAVENOUS

## 2018-10-01 SURGICAL SUPPLY — 11 items
CATH BEACON 5 .038 100 VERT TP (CATHETERS) ×2 IMPLANT
CATH INFUS 135CMX50CM (CATHETERS) ×2 IMPLANT
CATH PIG 70CM (CATHETERS) ×2 IMPLANT
COVER PROBE U/S 5X48 (MISCELLANEOUS) ×2 IMPLANT
GLIDEWIRE ADV .035X260CM (WIRE) ×2 IMPLANT
PACK ANGIOGRAPHY (CUSTOM PROCEDURE TRAY) ×2 IMPLANT
SHEATH BRITE TIP 5FRX11 (SHEATH) ×2 IMPLANT
SHEATH PINNACLE ST 6F 45CM (SHEATH) ×2 IMPLANT
TOWEL OR 17X26 4PK STRL BLUE (TOWEL DISPOSABLE) ×2 IMPLANT
TUBING CONTRAST HIGH PRESS 72 (TUBING) ×2 IMPLANT
WIRE J 3MM .035X145CM (WIRE) ×2 IMPLANT

## 2018-10-01 SURGICAL SUPPLY — 18 items
BALLN LUTONIX  018 4X60X130 (BALLOONS) ×1
BALLN LUTONIX 018 4X60X130 (BALLOONS) ×1
BALLN LUTONIX 018 5X150X130 (BALLOONS) ×2
BALLN ULTRVRSE 3X220X150 (BALLOONS) ×2
BALLOON LUTONIX 018 4X60X130 (BALLOONS) ×1 IMPLANT
BALLOON LUTONIX 018 5X150X130 (BALLOONS) ×1 IMPLANT
BALLOON ULTRVRSE 3X220X150 (BALLOONS) ×1 IMPLANT
CANISTER PENUMBRA ENGINE (MISCELLANEOUS) ×2 IMPLANT
CATH BEACON 5 .038 100 VERT TP (CATHETERS) ×2 IMPLANT
CATH INDIGO CAT6 KIT (CATHETERS) ×2 IMPLANT
DEVICE PRESTO INFLATION (MISCELLANEOUS) ×2 IMPLANT
DEVICE STARCLOSE SE CLOSURE (Vascular Products) ×2 IMPLANT
GLIDEWIRE ADV .035X260CM (WIRE) ×2 IMPLANT
GUIDEWIRE PFTE-COATED .018X300 (WIRE) ×2 IMPLANT
PACK ANGIOGRAPHY (CUSTOM PROCEDURE TRAY) ×2 IMPLANT
STENT VIABAHN 5X7.5X120 (Permanent Stent) ×2 IMPLANT
STENT VIABAHN 6X150X120 (Permanent Stent) ×2 IMPLANT
WIRE G V18X300CM (WIRE) ×2 IMPLANT

## 2018-10-01 NOTE — Progress Notes (Signed)
PHARMACY NOTE:  RENAL DOSAGE ADJUSTMENT  Current  dosage:  Tirofiban 0.15 mcg/kg/min   Estimated Creatinine Clearance: 33.2 mL/min (A) (by C-G formula based on SCr of 1.89 mg/dL (H)).  Dosage has been changed to:  Tirofiban 0.075 mcg/kg/min   Thank you for allowing pharmacy to be a part of this patient's care.  Pernell Dupre, PharmD, BCPS Clinical Pharmacist 10/01/2018 5:36 PM

## 2018-10-01 NOTE — Progress Notes (Signed)
Pt. Med. With Morphine 4 mg IVP (slow) for return of pain to RLE,(FOOT). Pt. States the pain went away for a little while, and now it's back lower in my foot now."

## 2018-10-01 NOTE — Op Note (Signed)
[facility]  Percutaneous Study/Intervention Procedural Note   Date of Surgery: 10/01/2018  Surgeon(s):Wylan Gentzler    Assistants:none  Pre-operative Diagnosis: PAD with rest pain, acute on chronic ischemia right lower extremity  Post-operative diagnosis:  Same  Procedure(s) Performed:             1.   Right lower extremity angiogram             2.  Catheter placement into right posterior tibial artery and right anterior tibial artery from left femoral approach             3.   Mechanical thrombectomy to the right SFA, popliteal artery, tibioperoneal trunk, and posterior tibial arteries with the penumbra cat 6 device             4.  Percutaneous transluminal angioplasty of the right posterior tibial artery and tibioperoneal trunk with 3 mm diameter angioplasty balloon throughout and a 4 mm diameter Lutonix drug-coated angioplasty balloon proximally             5.   Viabahn stent placement to the proximal SFA with a 6 mm diameter by 15 cm length stent postdilated with a 5 mm diameter Lutonix drug-coated balloon  6.  Percutaneous transluminal angioplasty of the right anterior tibial artery with 3 mm diameter by 22 cm length angioplasty balloon  7.  Viabahn stent placement to the right tibioperoneal trunk and proximal posterior tibial artery with 5 mm diameter by 10 cm length stent for high-grade residual stenosis and thrombus after above procedures             8.  StarClose closure device left femoral artery  EBL: 400 cc  Contrast: 100 cc  Fluoro Time: 17.9 minutes  Moderate Conscious Sedation Time: approximately 80 minutes using 4 mg of Versed and 100 Mcg of Fentanyl              Indications:  Patient is a 63 y.o.male with limb threatening ischemia of the right leg who was brought in and started on catheter directed thrombolytic therapy earlier today. The patient is brought in for angiography for further evaluation and potential treatment.  Due to the limb  threatening nature of the situation, angiogram was performed for attempted limb salvage. The patient is aware that if the procedure fails, amputation would be expected.  The patient also understands that even with successful revascularization, amputation may still be required due to the severity of the situation.  Risks and benefits are discussed and informed consent is obtained.   Procedure:  The patient was identified and appropriate procedural time out was performed.  The patient was then placed supine on the table and prepped and draped in the usual sterile fashion. Moderate conscious sedation was administered during a face to face encounter with the patient throughout the procedure with my supervision of the RN administering medicines and monitoring the patient's vital signs, pulse oximetry, telemetry and mental status throughout from the start of the procedure until the patient was taken to the recovery room.  The existing thrombolytic catheter was removed over a V 18 wire.  Imaging of the right lower extremity was then performed through the sheath that was in the proximal right SFA. There was now a channel of blood but continued thrombus with high-grade stenosis was seen in the proximal SFA above the previously placed stents.  There is also nearly occlusive thrombus in the midportion of the previously placed stents and thrombus in the anterior  tibial artery proximally, tibioperoneal trunk, and the proximal portion of the posterior tibial artery. It was felt that it was in the patient's best interest to proceed with intervention after these images to avoid a second procedure and a larger amount of contrast and fluoroscopy based off of the findings from the initial angiogram. The patient was systemically heparinized.  The penumbra cat 6 device was then brought onto the field and mechanical thrombectomy was then performed.  This included the right SFA, popliteal artery, tibioperoneal trunk, and proximal to mid  posterior tibial arteries.  Multiple passes were made and there were chunks of thrombus that were removed.  The thrombus within the stents was now resolved but there remained a thrombus and stenosis proximal to the stents.  There was also a high-grade residual stenosis in the distal tibioperoneal trunk and proximal posterior tibial artery that was nearly occlusive.  At this point, the anterior tibial artery remained occluded in the proximal segment.  I then began with an angioplasty of the posterior tibial artery proximally in the tibioperoneal trunk with a 3 mm diameter by 22 cm length angioplasty balloon inflated to 10 atm for 1 minute.  This did not resolve any of the stenosis in the proximal posterior tibial artery tibioperoneal trunk and now there appeared to be minimal flow distally.  I then took the 3 mm balloon down into the foot and performed angioplasty from just below the ankle up to the origin of the tibioperoneal trunk with 3 mm balloon inflated to 6 atm distally and 12 atm proximally.  There remained a high-grade residual stenosis at the origin of the posterior tibial artery and the distal tibioperoneal trunk.  I treated this with a 4 mm diameter Lutonix drug-coated angioplasty balloon inflated to 10 atm for 1 minute with minimal improvement.  I then turned my attention to the proximal SFA and placed a 6 mm diameter by 15 cm length via bond stent to encompass the residual stenosis and thrombus proximal to the previously placed stent.  This just bridged into the previously placed stent.  This was postdilated with a 5 mm balloon with excellent angiographic completion result and less than 10% residual stenosis.  There remained very poor runoff distally.  I then used a Kumpe catheter and navigated through the anterior tibial artery occlusion.  The anterior tibial artery was patent in the mid and distal segments and continuous into the foot.  I replaced a 0.018 wire and elected to treat the anterior tibial  artery.  The 3 mm diameter by 22 cm length angioplasty balloon was inflated to 8 atm for 1 minute and the proximal to mid anterior tibial artery.  Completion imaging showed less than 30% residual stenosis in the right anterior tibial artery.  There remained a high-grade focal stenosis at the origin of the posterior tibial artery and a more moderate stenosis about 5 to 6 cm downstream.  I elected to place a 5 mm diameter by 10 cm length Viabahn stent from the mid tibioperoneal trunk down across these 2 lesions into the posterior tibial artery and postdilated this with a 4 mm balloon.  Less than 20% residual stenosis was identified and he now had two-vessel runoff distally.  I did not feel there was much more we could do from an endovascular standpoint and at this point we will try to continue anticoagulation and maximal medical therapy.  I elected to terminate the procedure. The sheath was removed and StarClose closure device was deployed in the  left femoral artery with excellent hemostatic result. The patient was taken to the recovery room in stable condition having tolerated the procedure well.  Findings:                            Right Lower Extremity:  There was now a channel of blood but continued thrombus with high-grade stenosis was seen in the proximal SFA above the previously placed stents.  There is also nearly occlusive thrombus in the midportion of the previously placed stents and thrombus in the anterior tibial artery proximally, tibioperoneal trunk, and the proximal portion of the posterior tibial artery.  The distal runoff was very poorly seen due to the sluggish flow.   Disposition: Patient was taken to the recovery room in stable condition having tolerated the procedure well.  Complications: None  Leotis Pain 10/01/2018 4:35 PM   This note was created with Dragon Medical transcription system. Any errors in dictation are purely unintentional.

## 2018-10-01 NOTE — Progress Notes (Signed)
Consulted with Dr Lucky Cowboy regarding aggrastat gtt dose and bolus based on today's GFR < 60, previous procedure GRF was > 60,  Requested to keep present dose of > GFR 60

## 2018-10-01 NOTE — Discharge Summary (Signed)
[facility]    Discharge Summary  Patient ID:  Ryan [name] MRN: [id number] DOB/AGE: 25-Nov-1955 63 y.o.  Admit date: 10/01/2018 Discharge date: 10/01/2018 Date of Surgery: 10/01/2018 Surgeon: Surgeon(s): Lucky Cowboy Erskine Squibb, MD  Admission Diagnosis: RT lower extremity angio    ASO w rest pain  Discharge Diagnoses:  RT lower extremity angio    ASO w rest pain  Secondary Diagnoses: Past Medical History:  Diagnosis Date  . Hypertension   . Neuromuscular disorder (HCC)    numbness feet  . Peripheral vascular disease ([place])   . Shortness of breath dyspnea    Procedure(s): RIGHT Lower Extremity Angiography x2  Discharged Condition: good  HPI:  The patient is a 63 year old male with multiple medical issues including known peripheral artery disease who presented with rest pain to the right foot.    On 10/01/18, the patient underwent: 1. Ultrasound guidance for vascular access left femoral artery 2. Catheter placement into right peroneal artery and right posterior tibial artery from left femoral approach 3. Aortogram and selective right lower extremity angiogram occluding selective images of the peroneal artery and the posterior tibial artery 4.  Catheter directed thrombolytic therapy with 8 mg of TPA instilled in the right SFA, popliteal artery, tibioperoneal trunk, and proximal posterior tibial arteries 5.  Placement of an infusion catheter for continued thrombolytic therapy with a 130 cm total length 50 cm working length lysis catheter  The patient tolerated the procedure well was transferred to the recovery room where he underwent TPA and Heparin infusion for approximately 3 hours and was then taken back to the interventional radiology suite and underwent:  1.  Right lower extremity angiogram 2. Catheter placement into right posterior tibial artery and right anterior  tibial artery from left femoral approach 3.  Mechanical thrombectomy to the right SFA, popliteal artery, tibioperoneal trunk, and posterior tibial arteries with the penumbra cat 6 device 4. Percutaneous transluminal angioplasty of the right posterior tibial artery and tibioperoneal trunk with 3 mm diameter angioplasty balloon throughout and a 4 mm diameter Lutonix drug-coated angioplasty balloon proximally 5.  Viabahn stent placement to the proximal SFA with a 6 mm diameter by 15 cm length stent postdilated with a 5 mm diameter Lutonix drug-coated balloon             6.  Percutaneous transluminal angioplasty of the right anterior tibial artery with 3 mm diameter by 22 cm length angioplasty balloon             7.  Viabahn stent placement to the right tibioperoneal trunk and proximal posterior tibial artery with 5 mm diameter by 10 cm length stent for high-grade residual stenosis and thrombus after above procedures 8. StarClose closure device left femoral artery  The patient tolerated the procedure well was transferred to the surgical floor where he underwent Aggrastat infusion overnight.    POD #1/day of discharge there was a notable improvement in the patient's right lower extremity exam and symptoms.  He was tolerating a regular diet, his pain was controlled to the use of PO pain medication, he was urinating without issue and he was ambulating independently.  Upon discharge, patient was afebrile with stable vital signs.  Hospital Course:  CHRISOTPHER RIVERO is a 63 y.o. male is S/P Right  Procedure(s): Right Lower Extremity Angiography x2  Extubated: POD # 0  Physical exam:  A&Ox3, NAD CV: RRR Pulm: Clear to auscultation bilaterally Abdomen: Soft, nontender, nondistended, (+) bowel sounds Left groin: Access  site clean dry and intact.  No signs of swelling or drainage. Right lower extremity: Thigh soft.  Calf soft.  Extremities warm to the  toes.  Hard to palpate pedal pulses however the foot is warm.  Good capillary refill.  Motor/sensory is intact.  Post-op wounds clean, dry, intact or healing well  Pt. Ambulating, voiding and taking PO diet without difficulty.  Pt pain controlled with PO pain meds.  Labs as below  Complications:none  Consults: None  Significant Diagnostic Studies: CBC Lab Results  Component Value Date   WBC 5.1 09/06/2018   HGB 8.1 (L) 09/06/2018   HCT 24.7 (L) 09/06/2018   MCV 95.0 09/06/2018   PLT 169 09/06/2018   BMET    Component Value Date/Time   NA 139 09/06/2018 0421   NA 137 08/31/2015 0929   K 4.0 09/06/2018 0421   CL 111 09/06/2018 0421   CO2 22 09/06/2018 0421   GLUCOSE 105 (H) 09/06/2018 0421   BUN 22 10/01/2018 0905   BUN 11 08/31/2015 0929   CREATININE 1.89 (H) 10/01/2018 0905   CREATININE 1.95 (H) 07/21/2018 1501   CALCIUM 8.2 (L) 09/06/2018 0421   GFRNONAA 37 (L) 10/01/2018 0905   GFRNONAA 36 (L) 07/21/2018 1501   GFRAA 43 (L) 10/01/2018 0905   GFRAA 42 (L) 07/21/2018 1501   COAG No results found for: INR, PROTIME  Disposition:  Discharge to :Home  Allergies as of 10/01/2018   No Known Allergies     Medication List    TAKE these medications   acetaminophen 325 MG tablet Commonly known as:  TYLENOL Take 2 tablets (650 mg total) by mouth every 6 (six) hours as needed for mild pain (or Fever >/= 101).   apixaban 5 MG Tabs tablet Commonly known as:  Eliquis Take 1 tablet (5 mg total) by mouth 2 (two) times daily.   aspirin EC 81 MG tablet Take 1 tablet (81 mg total) by mouth daily.   atorvastatin 20 MG tablet Commonly known as:  LIPITOR Take 1 tablet (20 mg total) by mouth daily.   Fluticasone-Umeclidin-Vilant 100-62.5-25 MCG/INH Aepb Commonly known as:  Trelegy Ellipta Inhale 1 puff into the lungs daily. In place of Anoro   gabapentin 300 MG capsule Commonly known as:  NEURONTIN Take 1 capsule (300 mg total) by mouth 2 (two) times daily for 30  days.   HYDROcodone-acetaminophen 5-325 MG tablet Commonly known as:  NORCO/VICODIN Take 1 tablet by mouth every 6 (six) hours as needed for moderate pain or severe pain.   lisinopril 10 MG tablet Commonly known as:  PRINIVIL,ZESTRIL Take 1 tablet (10 mg total) by mouth daily.   magnesium gluconate 500 MG tablet Commonly known as:  MAGONATE Take 1 tablet (500 mg total) by mouth daily.   nicotine 21 mg/24hr patch Commonly known as:  NICODERM CQ - dosed in mg/24 hours Place 1 patch (21 mg total) onto the skin daily.   nystatin-triamcinolone ointment Commonly known as:  MYCOLOG Apply 1 application topically 2 (two) times daily.   Vitamin B-12 1000 MCG Subl Place 1 tablet (1,000 mcg total) under the tongue daily.      Verbal and written Discharge instructions given to the patient. Wound care per Discharge AVS Follow-up Information    Dew, Erskine Squibb, MD Follow up in 1 month(s).   Specialties:  Vascular Surgery, Radiology, Interventional Cardiology Why:  Can see Dew or midlevel. Will need ABI. Contact information: [place] [address] [phone]  SignedSela Hua, PA-C  10/01/2018, 4:42 PM

## 2018-10-01 NOTE — H&P (Signed)
[facility] History & Physical Update  The patient was interviewed and re-examined.  The patient's previous History and Physical has been reviewed and is unchanged.  There is no change in the plan of care. We plan to proceed with the scheduled procedure.  Leotis Pain, MD  10/01/2018, 9:25 AM

## 2018-10-01 NOTE — Discharge Instructions (Signed)
You may shower of Saturday. Keep groin clean and dry.  No driving on pain medication.  Vicodin prescription was sent to patients pharmacy on file electronically (sent 10/01/18 @ 430pm)

## 2018-10-01 NOTE — Progress Notes (Signed)
Pt. Med. With 1 mg Dilaudid IVP (slow) for c/o 10/10 pain to right LE. Pt. States "it feels like somebody is 'drilling' in there.  TPA and heparin gtts. Infusing to Lysis catheter via 6 FR sheath LFA.

## 2018-10-01 NOTE — Op Note (Signed)
[facility]  Percutaneous Study/Intervention Procedural Note   Date of Surgery: 10/01/2018  Surgeon(s):DEW,JASON    Assistants:none  Pre-operative Diagnosis: PAD with rest pain right foot.  Acute on chronic ischemia with thrombosis of previous intervention  Post-operative diagnosis:  Same  Procedure(s) Performed:             1.  Ultrasound guidance for vascular access left femoral artery             2.  Catheter placement into right peroneal artery and right posterior tibial artery from left femoral approach             3.  Aortogram and selective right lower extremity angiogram occluding selective images of the peroneal artery and the posterior tibial artery             4.   Catheter directed thrombolytic therapy with 8 mg of TPA instilled in the right SFA, popliteal artery, tibioperoneal trunk, and proximal posterior tibial arteries             5.   Placement of an infusion catheter for continued thrombolytic therapy with a 130 cm total length 50 cm working length lysis catheter  EBL: 10 cc  Contrast: 30 cc  Fluoro Time: 6.2 minutes  Moderate Conscious Sedation Time: approximately 40 minutes using 2 mg of Versed and 50 Mcg of Fentanyl              Indications:  Patient is a 63 y.o.male with recurrent rest pain to the right foot and occlusion of his previous intervention by noninvasive studies. The patient is brought in for angiography for further evaluation and potential treatment.  Due to the limb threatening nature of the situation, angiogram was performed for attempted limb salvage. The patient is aware that if the procedure fails, amputation would be expected.  The patient also understands that even with successful revascularization, amputation may still be required due to the severity of the situation.  Risks and benefits are discussed and informed consent is obtained.   Procedure:  The patient was identified and appropriate procedural time out was  performed.  The patient was then placed supine on the table and prepped and draped in the usual sterile fashion. Moderate conscious sedation was administered during a face to face encounter with the patient throughout the procedure with my supervision of the RN administering medicines and monitoring the patient's vital signs, pulse oximetry, telemetry and mental status throughout from the start of the procedure until the patient was taken to the recovery room. Ultrasound was used to evaluate the left common femoral artery.  It was patent .  A digital ultrasound image was acquired.  A Seldinger needle was used to access the left common femoral artery under direct ultrasound guidance and a permanent image was performed.  A 0.035 J wire was advanced without resistance and a 5Fr sheath was placed.  Pigtail catheter was placed into the aorta and an AP aortogram was performed. This demonstrated left renal artery widely patent.  Right renal artery not well seen but may have been positioning of the catheter and table. No aorta or iliac artery stenosis. I then crossed the aortic bifurcation and advanced to the right femoral head and then into the proximal right SFA. Selective right lower extremity angiogram was then performed. This demonstrated occlusion of the SFA in the proximal segment with occlusion of the previously placed stents in the SFA and popliteal arteries.  Distal reconstitution was sluggish and  difficult to see but there appeared to be a posterior tibial artery reconstituting distally on the initial images. It was felt that it was in the patient's best interest to proceed with intervention after these images to avoid a second procedure and a larger amount of contrast and fluoroscopy based off of the findings from the initial angiogram. The patient was systemically heparinized and a 6 [place] destination sheath was then placed over the Terumo Advantage wire. I then used a Kumpe catheter and the advantage wire to  cross the SFA and popliteal arteries and initially navigated down to the TP trunk.  The wire and catheter were advanced into the peroneal artery and selective imaging of the peroneal artery was performed.  This appeared to be occluded without good distal reconstitution.  I then pulled the Kumpe catheter and the advantage wire back to the tibioperoneal trunk and redirected this into the posterior tibial artery.  I advanced to the mid posterior tibial artery.  The posterior tibial artery was occluded proximally but then reconstituted in the midsegment and was continuous to the foot.  At this point, I exchanged for a thrombolytic catheter and instilled 8 mg of TPA from the proximal SFA down to the popliteal artery, tibioperoneal trunk, and proximal posterior tibial artery.  I felt his best chance for revascularization would be a continuous TPA infusion and the catheter was left in place and this will be continued.  The catheter and the sheath were secured to the skin. The patient was taken to the recovery room in stable condition having tolerated the procedure well.  Findings:               Aortogram:  left renal artery widely patent.  Right renal artery not well seen but may have been positioning of the catheter and table. No aorta or iliac artery stenosis             Right Lower Extremity:  This demonstrated occlusion of the SFA in the proximal segment with occlusion of the previously placed stents in the SFA and popliteal arteries.  Distal reconstitution was sluggish and difficult to see but there appeared to be a posterior tibial artery reconstituting distally on the initial images   Disposition: Patient was taken to the recovery room in stable condition having tolerated the procedure well.  Complications: None  Leotis Pain 10/01/2018 10:54 AM   This note was created with Dragon Medical transcription system. Any errors in dictation are purely unintentional.

## 2018-10-02 ENCOUNTER — Encounter: Payer: Self-pay | Admitting: Vascular Surgery

## 2018-10-02 DIAGNOSIS — I10 Essential (primary) hypertension: Secondary | ICD-10-CM | POA: Diagnosis not present

## 2018-10-02 DIAGNOSIS — G709 Myoneural disorder, unspecified: Secondary | ICD-10-CM | POA: Diagnosis not present

## 2018-10-02 DIAGNOSIS — M79671 Pain in right foot: Secondary | ICD-10-CM | POA: Diagnosis not present

## 2018-10-02 DIAGNOSIS — Z720 Tobacco use: Secondary | ICD-10-CM | POA: Diagnosis not present

## 2018-10-02 DIAGNOSIS — I739 Peripheral vascular disease, unspecified: Secondary | ICD-10-CM | POA: Diagnosis not present

## 2018-10-02 DIAGNOSIS — Z79899 Other long term (current) drug therapy: Secondary | ICD-10-CM | POA: Diagnosis not present

## 2018-10-02 DIAGNOSIS — Z7901 Long term (current) use of anticoagulants: Secondary | ICD-10-CM | POA: Diagnosis not present

## 2018-10-02 LAB — BASIC METABOLIC PANEL
Anion gap: 8 (ref 5–15)
BUN: 19 mg/dL (ref 8–23)
CO2: 20 mmol/L — ABNORMAL LOW (ref 22–32)
Calcium: 8 mg/dL — ABNORMAL LOW (ref 8.9–10.3)
Chloride: 110 mmol/L (ref 98–111)
Creatinine, Ser: 1.65 mg/dL — ABNORMAL HIGH (ref 0.61–1.24)
GFR calc Af Amer: 51 mL/min — ABNORMAL LOW (ref >60)
GFR calc non Af Amer: 44 mL/min — ABNORMAL LOW (ref >60)
Glucose, Bld: 113 mg/dL — ABNORMAL HIGH (ref 70–99)
Potassium: 4.6 mmol/L (ref 3.5–5.1)
Sodium: 138 mmol/L (ref 135–145)

## 2018-10-02 MED ORDER — APIXABAN 5 MG PO TABS: 5.0000 mg | ORAL_TABLET | Freq: Two times a day (BID) | ORAL | 3 refills | Status: DC

## 2018-10-02 MED ORDER — HYDROCODONE-ACETAMINOPHEN 5-325 MG PO TABS: 1.0000 | ORAL_TABLET | Freq: Four times a day (QID) | ORAL | 0 refills | Status: DC | PRN

## 2018-10-02 MED ORDER — APIXABAN 5 MG PO TABS: 5.0000 mg | ORAL_TABLET | ORAL | Status: DC

## 2018-10-02 NOTE — Progress Notes (Signed)
Pt discharged home. Escorted to vehicle in wheelchair by nursing staff.

## 2018-10-02 NOTE — Progress Notes (Signed)
Discharge instructions given. Pt verbalizes understanding. 

## 2018-10-06 ENCOUNTER — Encounter: Payer: Self-pay | Admitting: Vascular Surgery

## 2018-10-12 ENCOUNTER — Telehealth (INDEPENDENT_AMBULATORY_CARE_PROVIDER_SITE_OTHER): Payer: Self-pay | Admitting: Vascular Surgery

## 2018-10-12 ENCOUNTER — Other Ambulatory Visit (INDEPENDENT_AMBULATORY_CARE_PROVIDER_SITE_OTHER): Payer: Self-pay | Admitting: Vascular Surgery

## 2018-10-12 DIAGNOSIS — Z9862 Peripheral vascular angioplasty status: Secondary | ICD-10-CM

## 2018-10-12 NOTE — Telephone Encounter (Signed)
Pt called states LLE is hurting. I advised that there is not an appt earlier than the 5/12 appt that he has and I could place him on a waiting list. I also advised I could have a nurse reach out to him. He advised he would like that.

## 2018-10-12 NOTE — Telephone Encounter (Signed)
Patient had a leg anigo on 10/01/2018.

## 2018-10-13 ENCOUNTER — Encounter (INDEPENDENT_AMBULATORY_CARE_PROVIDER_SITE_OTHER): Payer: Self-pay | Admitting: Vascular Surgery

## 2018-10-13 ENCOUNTER — Other Ambulatory Visit: Payer: Self-pay

## 2018-10-13 ENCOUNTER — Encounter (INDEPENDENT_AMBULATORY_CARE_PROVIDER_SITE_OTHER): Payer: BLUE CROSS/BLUE SHIELD

## 2018-10-13 ENCOUNTER — Encounter (INDEPENDENT_AMBULATORY_CARE_PROVIDER_SITE_OTHER): Payer: Self-pay

## 2018-10-13 ENCOUNTER — Ambulatory Visit (INDEPENDENT_AMBULATORY_CARE_PROVIDER_SITE_OTHER): Payer: BLUE CROSS/BLUE SHIELD | Admitting: Vascular Surgery

## 2018-10-13 ENCOUNTER — Ambulatory Visit (INDEPENDENT_AMBULATORY_CARE_PROVIDER_SITE_OTHER): Payer: BLUE CROSS/BLUE SHIELD

## 2018-10-13 VITALS — BP 142/83 | HR 77 | Resp 16 | Wt 127.8 lb

## 2018-10-13 DIAGNOSIS — I70221 Atherosclerosis of native arteries of extremities with rest pain, right leg: Secondary | ICD-10-CM | POA: Diagnosis not present

## 2018-10-13 DIAGNOSIS — I10 Essential (primary) hypertension: Secondary | ICD-10-CM | POA: Diagnosis not present

## 2018-10-13 DIAGNOSIS — I7789 Other specified disorders of arteries and arterioles: Secondary | ICD-10-CM

## 2018-10-13 DIAGNOSIS — Z9862 Peripheral vascular angioplasty status: Secondary | ICD-10-CM | POA: Diagnosis not present

## 2018-10-13 DIAGNOSIS — F17211 Nicotine dependence, cigarettes, in remission: Secondary | ICD-10-CM

## 2018-10-13 DIAGNOSIS — Z7901 Long term (current) use of anticoagulants: Secondary | ICD-10-CM

## 2018-10-13 DIAGNOSIS — Z79899 Other long term (current) drug therapy: Secondary | ICD-10-CM

## 2018-10-13 DIAGNOSIS — I739 Peripheral vascular disease, unspecified: Secondary | ICD-10-CM | POA: Diagnosis not present

## 2018-10-13 DIAGNOSIS — I70229 Atherosclerosis of native arteries of extremities with rest pain, unspecified extremity: Secondary | ICD-10-CM | POA: Insufficient documentation

## 2018-10-13 NOTE — Patient Instructions (Signed)
Angiogram  An angiogram is a procedure used to examine the blood vessels. In this procedure, contrast dye is injected through a long, thin tube (catheter) into an artery. X-rays are then taken, which show if there is a blockage or problem in a blood vessel. The catheter may be inserted in:  Your groin area. This is the most common.  The fold of your arm, near your elbow.  Your wrist. Tell a health care provider about:  Any allergies you have, including allergies to shellfish or contrast dye.  All medicines you are taking, including vitamins, herbs, eye drops, creams, and over-the-counter medicines.  Any problems you or family members have had with anesthetic medicines.  Any blood disorders you have.  Any surgeries you have had.  Any previous kidney problems or failure you have had.  Any medical conditions you have.  Whether you are pregnant or may be pregnant.  Whether you are breastfeeding. What are the risks? Generally, this is a safe procedure. However, problems may occur, including:  Infection or bruising at the catheter area.  Damage to other structures or organs, including rupture of blood vessels or damage to arteries.  Allergic reaction to the contrast dye used.  Kidney damage from the contrast dye used.  Blood clots that can lead to a stroke or heart attack. What happens before the procedure? Staying hydrated Follow instructions from your health care provider about hydration, which may include:  Up to 2 hours before the procedure - you may continue to drink clear liquids, such as water, clear fruit juice, black coffee, and plain tea. Eating and drinking restrictions Follow instructions from your health care provider about eating and drinking, which may include:  8 hours before the procedure - stop eating heavy meals or foods such as meat, fried foods, or fatty foods.  6 hours before the procedure - stop eating light meals or foods, such as toast or cereal.   6 hours before the procedure - stop drinking milk or drinks that contain milk.  2 hours before the procedure - stop drinking clear liquids. General instructions  Ask your health care provider about: ? Changing or stopping your normal medicines. This is important if you take diabetes medicines or blood thinners. ? Taking medicines such as aspirin and ibuprofen. These medicines can thin your blood. Do not take these medicines before your procedure if your doctor tells you not to.  You may have blood samples taken.  Plan to have someone take you home from the hospital or clinic.  If you will be going home right after the procedure, plan to have someone with you for 24 hours. What happens during the procedure?  To reduce your risk of infection: ? Your health care team will wash or sanitize their hands. ? Your skin will be washed with soap. ? Hair may be removed from the insertion area.  You will lie on your back on an X-ray table. You may be strapped to the table if it is tilted.  An IV tube will be inserted into one of your veins.  Electrodes may be placed on your chest to monitor your heart rate during the procedure.  You will be given one or more of the following: ? A medicine to help you relax (sedative). ? A medicine to numb the area where the catheter will be inserted (local anesthetic).  The catheter will be inserted into an artery using a guide wire. A type of X-ray (fluoroscopy) will be used to help   guide the catheter to the blood vessel to be examined.  A contrast dye will then be injected into the catheter, and X-rays will be taken. The contrast will help to show where any narrowing or blockages are located in the blood vessels. You may feel flushed as the contrast dye is injected.  After the X-ray is complete, the catheter will be removed.  A bandage (dressing) will be placed over the site where the catheter was inserted. Pressure will be applied to help stop any  bleeding. The procedure may vary among health care providers and hospitals. What happens after the procedure?  Your blood pressure, heart rate, breathing rate, and blood oxygen level will be monitored until the medicines you were given have worn off.  You will be kept in bed lying flat for several hours. If the catheter was inserted through your leg, you will be instructed not to bend or cross your legs.  The insertion area and the pulse in your feet or wrist will be checked frequently.  You will be instructed to drink plenty of fluids. This will help wash the contrast dye out of your body.  Additional blood tests and X-rays may be done.  Tests to check the electrical activity in your heart (electrocardiogram) may be done.  Do not drive for 24 hours if you received a sedative.  It is up to you to get the results of your procedure. Ask your health care provider, or the department that is doing the procedure, when your results will be ready. Summary  An angiogram is a procedure used to examine the blood vessels.  In this procedure, contrast dye is injected through a long, thin tube (catheter) into an artery. X-rays are then taken.  Before the procedure, follow your health care provider's instructions about eating and drinking restrictions. You may be asked to stop eating and drinking several hours before the procedure.  After the procedure, you will need to lie flat for several hours and drink plenty of fluids. This information is not intended to replace advice given to you by your health care provider. Make sure you discuss any questions you have with your health care provider. Document Released: 03/20/2005 Document Revised: 10/15/2016 Document Reviewed: 07/17/2016 Elsevier Interactive Patient Education  2019 [employer]  

## 2018-10-13 NOTE — Progress Notes (Signed)
MRN : [id number]  Ryan [name] is a 63 y.o. (12/13/1955) male who presents with chief complaint of  Chief Complaint  Patient presents with  . Follow-up    abi results  .  History of Present Illness: Patient returns today in follow up of his peripheral arterial disease.  He is undergone multiple interventions to the right lower extremity but presents again today with recurrent right leg symptoms.  He has disabling claudication and some pain at rest.  No new ulceration or infection.  His access site is sore but otherwise his left leg is doing okay.  He reports no fevers or chills.  He denies smoking and is adamant that he has stopped.  He continues taking Eliquis regularly.  He actually says he was sometimes taking extra Eliquis which would help his legs feel better but he knew that was not safe to continue to do. Noninvasive studies today showed normal triphasic waveforms in the left lower extremity with an ABI of 1.2 but markedly reduced right ABI of 0.3 with monophasic waveforms and absent digital pressures.  Current Outpatient Medications  Medication Sig Dispense Refill  . apixaban (ELIQUIS) 5 MG TABS tablet Take 1 tablet (5 mg total) by mouth 2 (two) times daily. 180 tablet 3  . aspirin EC 81 MG tablet Take 1 tablet (81 mg total) by mouth daily. 90 tablet 3  . atorvastatin (LIPITOR) 20 MG tablet Take 1 tablet (20 mg total) by mouth daily. 90 tablet 3  . Cyanocobalamin (VITAMIN B-12) 1000 MCG SUBL Place 1 tablet (1,000 mcg total) under the tongue daily. 90 tablet 0  . Fluticasone-Umeclidin-Vilant (TRELEGY ELLIPTA) 100-62.5-25 MCG/INH AEPB Inhale 1 puff into the lungs daily. In place of [place] 180 each 0  . HYDROcodone-acetaminophen (NORCO) 5-325 MG tablet Take 1-2 tablets by mouth every 6 (six) hours as needed for moderate pain or severe pain. 50 tablet 0  . lisinopril (PRINIVIL,ZESTRIL) 10 MG tablet Take 1 tablet (10 mg total) by mouth daily. (Patient taking differently: Take 20 mg by  mouth daily. ) 90 tablet 0  . nicotine (NICODERM CQ - DOSED IN MG/24 HOURS) 21 mg/24hr patch Place 1 patch (21 mg total) onto the skin daily. 28 patch 1   No current facility-administered medications for this visit.     Past Medical History:  Diagnosis Date  . Hypertension   . Neuromuscular disorder (HCC)    numbness feet  . Peripheral vascular disease ([place])   . Shortness of breath dyspnea     Past Surgical History:  Procedure Laterality Date  . COLONOSCOPY WITH PROPOFOL N/A 02/15/2016   Procedure: COLONOSCOPY WITH PROPOFOL;  Surgeon: Lucilla Lame, MD;  Location: [place];  Service: Endoscopy;  Laterality: N/A;  . HERNIA REPAIR  1999   left inguinal  . LOWER EXTREMITY ANGIOGRAPHY Right 05/07/2018   Procedure: LOWER EXTREMITY ANGIOGRAPHY;  Surgeon: Algernon Huxley, MD;  Location: [place] [facility];  Service: Cardiovascular;  Laterality: Right;  . LOWER EXTREMITY ANGIOGRAPHY Right 06/25/2018   Procedure: LOWER EXTREMITY ANGIOGRAPHY;  Surgeon: Algernon Huxley, MD;  Location: [place] [facility];  Service: Cardiovascular;  Laterality: Right;  . LOWER EXTREMITY ANGIOGRAPHY Right 07/01/2018   Procedure: LOWER EXTREMITY ANGIOGRAPHY;  Surgeon: Algernon Huxley, MD;  Location: [place] [facility];  Service: Cardiovascular;  Laterality: Right;  . LOWER EXTREMITY ANGIOGRAPHY Right 08/12/2018   Procedure: LOWER EXTREMITY ANGIOGRAPHY;  Surgeon: Algernon Huxley, MD;  Location: [place] [place] [facility];  Service: Cardiovascular;  Laterality: Right;  . LOWER EXTREMITY ANGIOGRAPHY Right 09/03/2018   Procedure: LOWER EXTREMITY ANGIOGRAPHY;  Surgeon: Algernon Huxley, MD;  Location: [place] [facility];  Service: Cardiovascular;  Laterality: Right;  . LOWER EXTREMITY ANGIOGRAPHY Right 09/04/2018   Procedure: Lower Extremity Angiography;  Surgeon: Algernon Huxley, MD;  Location: [place] [place] [facility];  Service: Cardiovascular;  Laterality: Right;  . LOWER EXTREMITY ANGIOGRAPHY Right 10/01/2018   Procedure: LOWER  EXTREMITY ANGIOGRAPHY;  Surgeon: Algernon Huxley, MD;  Location: [place] [facility];  Service: Cardiovascular;  Laterality: Right;  . LOWER EXTREMITY ANGIOGRAPHY Right 10/01/2018   Procedure: Lower Extremity Angiography;  Surgeon: Algernon Huxley, MD;  Location: [place] [facility];  Service: Cardiovascular;  Laterality: Right;  . LOWER EXTREMITY INTERVENTION Right 07/02/2018   Procedure: LOWER EXTREMITY INTERVENTION;  Surgeon: Algernon Huxley, MD;  Location: [place] [facility];  Service: Cardiovascular;  Laterality: Right;  . POLYPECTOMY N/A 02/15/2016   Procedure: POLYPECTOMY;  Surgeon: Lucilla Lame, MD;  Location: [employer];  Service: Endoscopy;  Laterality: N/A;    Social History Social History   Tobacco Use  . Smoking status: Former Smoker    Packs/day: 0.10    Years: 40.00    Pack years: 4.00    Types: Cigarettes    Start date: 07/21/1978    Last attempt to quit: 08/28/2018    Years since quitting: 0.1  . Smokeless tobacco: Never Used  Substance Use Topics  . Alcohol use: Yes    Alcohol/week: 12.0 standard drinks    Types: 12 Cans of beer per week  . Drug use: Never    Family History Family History  Problem Relation Age of Onset  . COPD Mother   . Hypertension Father   . Heart attack Brother   no bleeding or clotting disorders  No Known Allergies  REVIEW OF SYSTEMS (Negative unless checked)  Constitutional: [x] ?Weight loss  [] ?Fever  [] ?Chills Cardiac: [] ?Chest pain   [] ?Chest pressure   [] ?Palpitations   [] ?Shortness of breath when laying flat   [] ?Shortness of breath at rest   [] ?Shortness of breath with exertion. Vascular:  [x] ?Pain in legs with walking   [] ?Pain in legs at rest   [] ?Pain in legs when laying flat   [x] ?Claudication   [] ?Pain in feet when walking  [x] ?Pain in feet at rest  [] ?Pain in feet when laying flat   [] ?History of DVT   [] ?Phlebitis   [] ?Swelling in legs   [] ?Varicose veins   [] ?Non-healing ulcers Pulmonary:   [] ?Uses home oxygen    [] ?Productive cough   [] ?Hemoptysis   [] ?Wheeze  [] ?COPD   [] ?Asthma Neurologic:  [] ?Dizziness  [] ?Blackouts   [] ?Seizures   [] ?History of stroke   [] ?History of TIA  [] ?Aphasia   [] ?Temporary blindness   [] ?Dysphagia   [] ?Weakness or numbness in arms   [x] ?Weakness or numbness in legs Musculoskeletal:  [x] ?Arthritis   [] ?Joint swelling   [] ?Joint pain   [] ?Low back pain Hematologic:  [] ?Easy bruising  [] ?Easy bleeding   [] ?Hypercoagulable state   [] ?Anemic  [] ?Hepatitis Gastrointestinal:  [] ?Blood in stool   [] ?Vomiting blood  [] ?Gastroesophageal reflux/heartburn   [] ?Abdominal pain Genitourinary:  [] ?Chronic kidney disease   [] ?Difficult urination  [] ?Frequent urination  [] ?Burning with urination   [] ?Hematuria Skin:  [] ?Rashes   [] ?Ulcers   [] ?Wounds Psychological:  [] ?History of anxiety   [] ? History of major depression.  Physical Examination  BP (!) 142/83 (BP Location: Right Arm)   Pulse 77   Resp  16   Wt 127 lb 12.8 oz (58 kg)   BMI 19.43 kg/m  Gen:  WD/WN, NAD Head: [place]/AT, No temporalis wasting. Ear/Nose/Throat: Hearing grossly intact, nares w/o erythema or drainage Eyes: Conjunctiva clear. Sclera non-icteric Neck: Supple.  Trachea midline Pulmonary:  Good air movement, no use of accessory muscles.  Cardiac: RRR, no JVD Vascular:  Vessel Right Left  Radial Palpable Palpable                          PT  not palpable Palpable  DP  not palpable Palpable   Gastrointestinal: soft, non-tender/non-distended. No guarding/reflex.  Musculoskeletal: M/S 5/5 throughout.  No deformity or atrophy.  No edema.  Right leg is somewhat cool compared to the left and capillary refill is slower on the right than the left Neurologic: Sensation grossly intact in extremities.  Symmetrical.  Speech is fluent.  Psychiatric: Judgment intact, Mood & affect appropriate for pt's clinical situation. Dermatologic: No rashes or ulcers noted.  No cellulitis or open wounds.       Labs Recent  Results (from the past 2160 hour(s))  B12 and Folate Panel     Status: None   Collection Time: 07/21/18  3:01 PM  Result Value Ref Range   Vitamin B-12 505 200 - 1,100 pg/mL   Folate 9.8 ng/mL    Comment:                            Reference Range                            Low:           <3.4                            Borderline:    3.4-5.4                            Normal:        >5.4 .   CBC with Differential/Platelet     Status: Abnormal   Collection Time: 07/21/18  3:01 PM  Result Value Ref Range   WBC 5.1 3.8 - 10.8 Thousand/uL   RBC 3.04 (L) 4.20 - 5.80 Million/uL   Hemoglobin 9.8 (L) 13.2 - 17.1 g/dL   HCT 28.7 (L) 38.5 - 50.0 %   MCV 94.4 80.0 - 100.0 fL   MCH 32.2 27.0 - 33.0 pg   MCHC 34.1 32.0 - 36.0 g/dL   RDW 11.8 11.0 - 15.0 %   Platelets 214 140 - 400 Thousand/uL   MPV 10.4 7.5 - 12.5 fL   Neutro Abs 2,973 1,500 - 7,800 cells/uL   Lymphs Abs 1,612 850 - 3,900 cells/uL   Absolute Monocytes 372 200 - 950 cells/uL   Eosinophils Absolute 122 15 - 500 cells/uL   Basophils Absolute 20 0 - 200 cells/uL   Neutrophils Relative % 58.3 %   Total Lymphocyte 31.6 %   Monocytes Relative 7.3 %   Eosinophils Relative 2.4 %   Basophils Relative 0.4 %  Iron, TIBC and Ferritin Panel     Status: None   Collection Time: 07/21/18  3:01 PM  Result Value Ref Range   Iron 81 50 - 180 mcg/dL   TIBC 261 250 - 425 mcg/dL (calc)   %  SAT 31 20 - 48 % (calc)   Ferritin 78 24 - 380 ng/mL  COMPLETE METABOLIC PANEL WITH GFR     Status: Abnormal   Collection Time: 07/21/18  3:01 PM  Result Value Ref Range   Glucose, Bld 107 65 - 139 mg/dL    Comment: .        Non-fasting reference interval .    BUN 30 (H) 7 - 25 mg/dL   Creat 1.95 (H) 0.70 - 1.25 mg/dL    Comment: For patients >60 years of age, the reference limit for Creatinine is approximately 13% higher for people identified as African-American. .    GFR, Est Non African American 36 (L) > OR = 60 mL/min/1.34m2   GFR, Est  African American 42 (L) > OR = 60 mL/min/1.73m2   BUN/Creatinine Ratio 15 6 - 22 (calc)   Sodium 137 135 - 146 mmol/L   Potassium 4.2 3.5 - 5.3 mmol/L   Chloride 108 98 - 110 mmol/L   CO2 21 20 - 32 mmol/L   Calcium 8.7 8.6 - 10.3 mg/dL   Total Protein 6.8 6.1 - 8.1 g/dL   Albumin 4.0 3.6 - 5.1 g/dL   Globulin 2.8 1.9 - 3.7 g/dL (calc)   AG Ratio 1.4 1.0 - 2.5 (calc)   Total Bilirubin 0.4 0.2 - 1.2 mg/dL   Alkaline phosphatase (APISO) 76 40 - 115 U/L   AST 16 10 - 35 U/L   ALT 12 9 - 46 U/L  Vitamin B1     Status: None   Collection Time: 07/21/18  3:01 PM  Result Value Ref Range   Vitamin B1 (Thiamine) 15 8 - 30 nmol/L    Comment: Marland Kitchen Vitamin supplementation within 24 hours prior to blood draw may affect the accuracy of the results. . This test was developed and its analytical performance characteristics have been determined by [employer], [place]. It has not been cleared or approved by the U.S. Food and Drug Administration. This assay has been validated pursuant to the CLIA regulations and is used for clinical purposes. .   BUN     Status: None   Collection Time: 08/12/18  9:37 AM  Result Value Ref Range   BUN 18 8 - 23 mg/dL    Comment: Performed at [facility], [address] [place], [place] [address]  Creatinine, serum     Status: Abnormal   Collection Time: 08/12/18  9:37 AM  Result Value Ref Range   Creatinine, Ser 1.28 (H) 0.61 - 1.24 mg/dL   GFR calc non Af Amer >60 >60 mL/min   GFR calc Af Amer >60 >60 mL/min    Comment: Performed at [facility], [place], [place] [address]  Glucose, capillary     Status: Abnormal   Collection Time: 09/03/18  4:57 PM  Result Value Ref Range   Glucose-Capillary 135 (H) 70 - 99 mg/dL  Heparin level (unfractionated) every 6 hours x 4 post-procedure     Status: None   Collection Time: 09/03/18  5:01 PM  Result Value Ref Range   Heparin Unfractionated 0.36 0.30 -  0.70 IU/mL    Comment: (NOTE) If heparin results are below expected values, and patient dosage has  been confirmed, suggest follow up testing of antithrombin III levels. Performed at [facility], [address] [place], [place] [address]   CBC every 6 hours x 4 post-procedure     Status: Abnormal  Collection Time: 09/03/18  5:01 PM  Result Value Ref Range   WBC 7.6 4.0 - 10.5 K/uL   RBC 3.58 (L) 4.22 - 5.81 MIL/uL   Hemoglobin 11.3 (L) 13.0 - 17.0 g/dL   HCT 33.7 (L) 39.0 - 52.0 %   MCV 94.1 80.0 - 100.0 fL   MCH 31.6 26.0 - 34.0 pg   MCHC 33.5 30.0 - 36.0 g/dL   RDW 12.0 11.5 - 15.5 %   Platelets 250 150 - 400 K/uL   nRBC 0.0 0.0 - 0.2 %    Comment: Performed at [facility], [address] [place], [place] [address]  Fibrinogen every 6 hours x 4 post-procedure     Status: Abnormal   Collection Time: 09/03/18  5:01 PM  Result Value Ref Range   Fibrinogen 607 (H) 210 - 475 mg/dL    Comment: Performed at [facility], [address] [place], [place] [address]  MRSA PCR Screening     Status: None   Collection Time: 09/03/18  5:19 PM  Result Value Ref Range   MRSA by PCR NEGATIVE NEGATIVE    Comment:        The GeneXpert MRSA Assay (FDA approved for NASAL specimens only), is one component of a comprehensive MRSA colonization surveillance program. It is not intended to diagnose MRSA infection nor to guide or monitor treatment for MRSA infections. Performed at [facility], [place], [place] [address]   Heparin level (unfractionated) every 6 hours x 4 post-procedure     Status: Abnormal   Collection Time: 09/03/18 10:19 PM  Result Value Ref Range   Heparin Unfractionated 0.28 (L) 0.30 - 0.70 IU/mL    Comment: (NOTE) If heparin results are below expected values, and patient dosage has  been confirmed, suggest follow up testing of antithrombin III levels. Performed at [facility], [address]  [place], [place] [address]   CBC every 6 hours x 4 post-procedure     Status: Abnormal   Collection Time: 09/03/18 10:19 PM  Result Value Ref Range   WBC 8.9 4.0 - 10.5 K/uL   RBC 3.69 (L) 4.22 - 5.81 MIL/uL   Hemoglobin 11.6 (L) 13.0 - 17.0 g/dL   HCT 34.5 (L) 39.0 - 52.0 %   MCV 93.5 80.0 - 100.0 fL   MCH 31.4 26.0 - 34.0 pg   MCHC 33.6 30.0 - 36.0 g/dL   RDW 12.1 11.5 - 15.5 %   Platelets 230 150 - 400 K/uL   nRBC 0.0 0.0 - 0.2 %    Comment: Performed at [facility], [address] [place], [place] [address]  Fibrinogen every 6 hours x 4 post-procedure     Status: Abnormal   Collection Time: 09/03/18 10:19 PM  Result Value Ref Range   Fibrinogen 502 (H) 210 - 475 mg/dL    Comment: Performed at [facility], [place], [place] [address]  Heparin level (unfractionated) every 6 hours x 4 post-procedure     Status: None   Collection Time: 09/04/18  3:31 AM  Result Value Ref Range   Heparin Unfractionated 0.39 0.30 - 0.70 IU/mL    Comment: (NOTE) If heparin results are below expected values, and patient dosage has  been confirmed, suggest follow up testing of antithrombin III levels. Performed at [facility], [address] [place],  [address]   CBC every 6 hours x 4 post-procedure     Status: Abnormal  Collection Time: 09/04/18  3:31 AM  Result Value Ref Range   WBC 7.2 4.0 - 10.5 K/uL   RBC 3.41 (L) 4.22 - 5.81 MIL/uL   Hemoglobin 10.8 (L) 13.0 - 17.0 g/dL   HCT 32.5 (L) 39.0 - 52.0 %   MCV 95.3 80.0 - 100.0 fL   MCH 31.7 26.0 - 34.0 pg   MCHC 33.2 30.0 - 36.0 g/dL   RDW 12.1 11.5 - 15.5 %   Platelets 186 150 - 400 K/uL   nRBC 0.0 0.0 - 0.2 %    Comment: Performed at [facility], [address] [place], [place] [address]  Fibrinogen every 6 hours x 4 post-procedure     Status: None   Collection Time: 09/04/18  3:31 AM  Result Value Ref Range   Fibrinogen 384 210 - 475 mg/dL    Comment: Performed at  [facility], [address] [place], [place] [address]  Basic metabolic panel     Status: Abnormal   Collection Time: 09/04/18  3:31 AM  Result Value Ref Range   Sodium 138 135 - 145 mmol/L   Potassium 4.1 3.5 - 5.1 mmol/L   Chloride 110 98 - 111 mmol/L   CO2 22 22 - 32 mmol/L   Glucose, Bld 116 (H) 70 - 99 mg/dL   BUN 18 8 - 23 mg/dL   Creatinine, Ser 1.71 (H) 0.61 - 1.24 mg/dL   Calcium 8.2 (L) 8.9 - 10.3 mg/dL   GFR calc non Af Amer 42 (L) >60 mL/min   GFR calc Af Amer 49 (L) >60 mL/min   Anion gap 6 5 - 15    Comment: Performed at [facility], [address] [place], [place] [address]  CBC     Status: Abnormal   Collection Time: 09/05/18  3:43 AM  Result Value Ref Range   WBC 6.1 4.0 - 10.5 K/uL   RBC 2.67 (L) 4.22 - 5.81 MIL/uL   Hemoglobin 8.5 (L) 13.0 - 17.0 g/dL   HCT 25.3 (L) 39.0 - 52.0 %   MCV 94.8 80.0 - 100.0 fL   MCH 31.8 26.0 - 34.0 pg   MCHC 33.6 30.0 - 36.0 g/dL   RDW 12.2 11.5 - 15.5 %   Platelets 160 150 - 400 K/uL   nRBC 0.0 0.0 - 0.2 %    Comment: Performed at [facility], [address] [place], [place] [address]  Basic metabolic panel     Status: Abnormal   Collection Time: 09/05/18  3:43 AM  Result Value Ref Range   Sodium 136 135 - 145 mmol/L   Potassium 3.9 3.5 - 5.1 mmol/L   Chloride 108 98 - 111 mmol/L   CO2 21 (L) 22 - 32 mmol/L   Glucose, Bld 113 (H) 70 - 99 mg/dL   BUN 16 8 - 23 mg/dL   Creatinine, Ser 1.58 (H) 0.61 - 1.24 mg/dL   Calcium 8.2 (L) 8.9 - 10.3 mg/dL   GFR calc non Af Amer 47 (L) >60 mL/min   GFR calc Af Amer 54 (L) >60 mL/min   Anion gap 7 5 - 15    Comment: Performed at [facility], [address] [place],  [address]  CBC     Status: Abnormal   Collection Time: 09/06/18  4:21 AM  Result Value Ref Range   WBC 5.1 4.0 - 10.5 K/uL   RBC 2.60 (L) 4.22 - 5.81 MIL/uL   Hemoglobin 8.1 (L)  13.0 - 17.0 g/dL   HCT 24.7 (L) 39.0 - 52.0 %   MCV 95.0 80.0 - 100.0 fL   MCH 31.2  26.0 - 34.0 pg   MCHC 32.8 30.0 - 36.0 g/dL   RDW 12.0 11.5 - 15.5 %   Platelets 169 150 - 400 K/uL   nRBC 0.0 0.0 - 0.2 %    Comment: Performed at [facility], [address] [place], [place] [address]  Basic metabolic panel     Status: Abnormal   Collection Time: 09/06/18  4:21 AM  Result Value Ref Range   Sodium 139 135 - 145 mmol/L   Potassium 4.0 3.5 - 5.1 mmol/L   Chloride 111 98 - 111 mmol/L   CO2 22 22 - 32 mmol/L   Glucose, Bld 105 (H) 70 - 99 mg/dL   BUN 14 8 - 23 mg/dL   Creatinine, Ser 1.30 (H) 0.61 - 1.24 mg/dL   Calcium 8.2 (L) 8.9 - 10.3 mg/dL   GFR calc non Af Amer 59 (L) >60 mL/min   GFR calc Af Amer >60 >60 mL/min   Anion gap 6 5 - 15    Comment: Performed at [facility], [address] [place], [place] [address]  Magnesium     Status: Abnormal   Collection Time: 09/06/18  4:21 AM  Result Value Ref Range   Magnesium 0.3 (LL) 1.7 - 2.4 mg/dL    Comment: CRITICAL RESULT CALLED TO, READ BACK BY AND VERIFIED WITH CYNTHIA VAZQUEZ 09/06/2018 0523 KBH Performed at [facility], [address] [place], [place] [address]   Phosphorus     Status: None   Collection Time: 09/06/18  4:21 AM  Result Value Ref Range   Phosphorus 3.1 2.5 - 4.6 mg/dL    Comment: Performed at [facility], [address] [place], [place] [address]  Glucose, capillary     Status: None   Collection Time: 09/06/18  7:11 AM  Result Value Ref Range   Glucose-Capillary 98 70 - 99 mg/dL  BUN     Status: None   Collection Time: 10/01/18  9:05 AM  Result Value Ref Range   BUN 22 8 - 23 mg/dL    Comment: Performed at [facility], [address] [place], [place] [address]  Creatinine, serum     Status: Abnormal   Collection Time: 10/01/18  9:05 AM  Result Value Ref Range   Creatinine, Ser 1.89 (H) 0.61 - 1.24 mg/dL   GFR calc non Af Amer 37 (L) >60 mL/min   GFR calc Af Amer 43 (L) >60 mL/min    Comment: Performed at [facility],  [address] [place], [place] [address]  Basic metabolic panel     Status: Abnormal   Collection Time: 10/02/18  5:42 AM  Result Value Ref Range   Sodium 138 135 - 145 mmol/L   Potassium 4.6 3.5 - 5.1 mmol/L   Chloride 110 98 - 111 mmol/L   CO2 20 (L) 22 - 32 mmol/L   Glucose, Bld 113 (H) 70 - 99 mg/dL   BUN 19 8 - 23 mg/dL   Creatinine, Ser 1.65 (H) 0.61 - 1.24 mg/dL   Calcium 8.0 (L) 8.9 - 10.3 mg/dL   GFR calc non Af Amer 44 (L) >60 mL/min   GFR calc Af Amer 51 (L) >60 mL/min   Anion gap 8 5 - 15    Comment: Performed at [facility], [address] [place], [place] [address]    Radiology Vas [place] Abi With/wo Tbi  Result Date: 10/06/2018 LOWER EXTREMITY DOPPLER STUDY  Vascular Interventions: 09/03/2018 Thrombectomy right SFA, popliteal and ATA and                         PTA.                         09/04/2018 PTA of ATA. PTA. PTA of right tioioperoneal                         trunk and PTA. Thrombectomy of right SFA, popliteal                         artery. ATA, tioioperoneal artery and PTA. Comparison Study: 09/14/2018 Performing Technologist: Charlane Ferretti RT (R)(VS)  Examination Guidelines: A complete evaluation includes at minimum, Doppler waveform signals and systolic blood pressure reading at the level of bilateral brachial, anterior tibial, and posterior tibial arteries, when vessel segments are accessible. Bilateral testing is considered an integral part of a complete examination. Photoelectric Plethysmograph (PPG) waveforms and toe systolic pressure readings are included as required and additional duplex testing as needed. Limited examinations for reoccurring indications may be performed as noted.  ABI Findings: +---------+------------------+-----+----------+--------+ Right    Rt Pressure (mmHg)IndexWaveform  Comment  +---------+------------------+-----+----------+--------+ Brachial 162                                        +---------+------------------+-----+----------+--------+ ATA      58                0.36 monophasic         +---------+------------------+-----+----------+--------+ PTA      58                0.36 monophasic         +---------+------------------+-----+----------+--------+ Great Toe47                0.29 Abnormal           +---------+------------------+-----+----------+--------+ +---------+------------------+-----+---------+-------+ Left     Lt Pressure (mmHg)IndexWaveform Comment +---------+------------------+-----+---------+-------+ Brachial 158                                     +---------+------------------+-----+---------+-------+ ATA      180               1.11 triphasic        +---------+------------------+-----+---------+-------+ PTA      189               1.17 triphasic        +---------+------------------+-----+---------+-------+ Great Toe160               0.99 Normal           +---------+------------------+-----+---------+-------+ +-------+-----------+-----------+------------+------------+ ABI/TBIToday's ABIToday's TBIPrevious ABIPrevious TBI +-------+-----------+-----------+------------+------------+ Right  .36        .29        1.04        .79          +-------+-----------+-----------+------------+------------+ Left   1.17       .99        1.15        .95          +-------+-----------+-----------+------------+------------+  Right ABIs appear decreased compared to prior study on 09/14/2018. Left ABIs appear essentially unchanged compared to prior study on 09/14/2018.  Summary: Right: Resting right ankle-brachial index indicates severe right lower extremity arterial disease. The right toe-brachial index is abnormal. Right TBI appears decreased since previous exam on 09/14/2018. The right ATA appears partially occluded on duplex ultrasound. Left: Resting left ankle-brachial index is within normal range. No evidence of significant left lower  extremity arterial disease. The left toe-brachial index is normal. Left TBI appears essentially unchanged from the previous exam on 09/14/2018.  *See table(s) above for measurements and observations.  Electronically signed by Leotis Pain MD on 10/06/2018 at 12:12:58 PM.    Final    Abi With/wo Tbi  Result Date: 09/17/2018 LOWER EXTREMITY DOPPLER STUDY  Vascular Interventions: 09/03/2018 Thrombectomy right SFA, popliteal and ATA and                         PTA.                         09/04/2018 PTA of ATA. PTA. PTA of right tioioperoneal                         trunk and PTA. Thrombectomy of right SFA, popliteal                         artery. ATA, tioioperoneal artery and PTA. Comparison Study: 09/02/2018 Performing Technologist: Charlane Ferretti RT (R)(VS)  Examination Guidelines: A complete evaluation includes at minimum, Doppler waveform signals and systolic blood pressure reading at the level of bilateral brachial, anterior tibial, and posterior tibial arteries, when vessel segments are accessible. Bilateral testing is considered an integral part of a complete examination. Photoelectric Plethysmograph (PPG) waveforms and toe systolic pressure readings are included as required and additional duplex testing as needed. Limited examinations for reoccurring indications may be performed as noted.  ABI Findings: +---------+------------------+-----+---------+--------+ Right    Rt Pressure (mmHg)IndexWaveform Comment  +---------+------------------+-----+---------+--------+ Brachial 162                                      +---------+------------------+-----+---------+--------+ ATA      174               1.04 triphasic         +---------+------------------+-----+---------+--------+ PTA      153               0.91 triphasic         +---------+------------------+-----+---------+--------+ Adair Patter               0.79 Normal            +---------+------------------+-----+---------+--------+  +---------+------------------+-----+---------+-------+ Left     Lt Pressure (mmHg)IndexWaveform Comment +---------+------------------+-----+---------+-------+ Brachial 168                                     +---------+------------------+-----+---------+-------+ ATA      180               1.07 triphasic        +---------+------------------+-----+---------+-------+ PTA      194               1.15 triphasic        +---------+------------------+-----+---------+-------+  Great Toe159               0.95                  +---------+------------------+-----+---------+-------+ +-------+-----------+-----------+------------+------------+ ABI/TBIToday's ABIToday's TBIPrevious ABIPrevious TBI +-------+-----------+-----------+------------+------------+ Right  1.04       .79        .38         not detected +-------+-----------+-----------+------------+------------+ Left   1.15       .95        1.04        .76          +-------+-----------+-----------+------------+------------+ Right ABIs appear increased compared to prior study on 09/02/2018. Left ABIs appear essentially unchanged compared to prior study on 09/02/2018. Bilateral TBI's have essentialy increased since previous exam on 09/02/2018.  Summary: Right: Resting right ankle-brachial index is within normal range. No evidence of significant right lower extremity arterial disease. The right toe-brachial index is normal. Left: Resting left ankle-brachial index is within normal range. No evidence of significant left lower extremity arterial disease. The left toe-brachial index is normal.  *See table(s) above for measurements and observations.  Electronically signed by Hortencia Pilar MD on 09/17/2018 at 4:43:46 PM.    Final     Assessment/Plan Hypertension, benign blood pressure control important in reducing the progression of atherosclerotic disease and aneurysmal growth. On appropriate oral medications.   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  This is likely largely due to tobacco use and hypertension which is not optimally controlled.  We discussed the pathophysiology and natural history of this at a previous visit.  This can be checked every year or 2 with a CT scan.  Atherosclerosis of native arteries of extremity with rest pain ([place]) Noninvasive studies today showed normal triphasic waveforms in the left lower extremity with an ABI of 1.2 but markedly reduced right ABI of 0.3 with monophasic waveforms and absent digital pressures. We had another long discussion today regarding the gravity of the situation.  He is not quite as symptomatic as he has been at some of his episodes recently, but he is still in quite a bit of pain.  I have proposed with him 3 options.  One option would be to consider proceeding with bypass at this point.  Another option would be no further intervention but continue anticoagulation.  The third option would be a repeat angiogram with attempt at revascularization and considering switching to a different anticoagulant.  All 3 of these have their limitations and there is clearly a very high risk of limb loss in the situation.  He would like to have 1 more endovascular attempt at revascularization and I think that is reasonable.  Following the procedure, we may switch him from Eliquis to Xarelto although Eliquis has been effective for most patients previously.  We discussed the following this, we may have to consider bypass options but I am concerned about its durability as well.  This is clearly a situation where patency is going to be difficult to obtain.  He may ultimately have to have severe claudication in that leg or lose that leg.  The patient voices his understanding and is agreeable.  He will be scheduled for right leg angiogram with possible intervention this week.  He will continue his Eliquis and remain abstinent from tobacco.    Leotis Pain, MD  10/13/2018 10:43 AM    This note was created with Vivian[employer]  medical transcription system.  Any errors from dictation are purely unintentional

## 2018-10-13 NOTE — Assessment & Plan Note (Signed)
Noninvasive studies today showed normal triphasic waveforms in the left lower extremity with an ABI of 1.2 but markedly reduced right ABI of 0.3 with monophasic waveforms and absent digital pressures. We had another long discussion today regarding the gravity of the situation.  He is not quite as symptomatic as he has been at some of his episodes recently, but he is still in quite a bit of pain.  I have proposed with him 3 options.  One option would be to consider proceeding with bypass at this point.  Another option would be no further intervention but continue anticoagulation.  The third option would be a repeat angiogram with attempt at revascularization and considering switching to a different anticoagulant.  All 3 of these have their limitations and there is clearly a very high risk of limb loss in the situation.  He would like to have 1 more endovascular attempt at revascularization and I think that is reasonable.  Following the procedure, we may switch him from Eliquis to Xarelto although Eliquis has been effective for most patients previously.  We discussed the following this, we may have to consider bypass options but I am concerned about its durability as well.  This is clearly a situation where patency is going to be difficult to obtain.  He may ultimately have to have severe claudication in that leg or lose that leg.  The patient voices his understanding and is agreeable.  He will be scheduled for right leg angiogram with possible intervention this week.  He will continue his Eliquis and remain abstinent from tobacco.

## 2018-10-15 ENCOUNTER — Other Ambulatory Visit (INDEPENDENT_AMBULATORY_CARE_PROVIDER_SITE_OTHER): Payer: Self-pay | Admitting: Vascular Surgery

## 2018-10-15 ENCOUNTER — Other Ambulatory Visit: Payer: Self-pay

## 2018-10-15 ENCOUNTER — Encounter: Payer: Self-pay | Admitting: *Deleted

## 2018-10-15 ENCOUNTER — Encounter
Admission: AD | Disposition: A | Discharge status: Home / Self Care | Payer: Self-pay | Source: Home / Self Care | Attending: Vascular Surgery

## 2018-10-15 ENCOUNTER — Inpatient Hospital Stay
Admission: AD | Admit: 2018-10-15 | Discharge: 2018-10-19 | DRG: 271 | Disposition: A | Discharge status: Home / Self Care | Payer: BLUE CROSS/BLUE SHIELD | Attending: Vascular Surgery | Admitting: Vascular Surgery

## 2018-10-15 DIAGNOSIS — N179 Acute kidney failure, unspecified: Secondary | ICD-10-CM | POA: Diagnosis present

## 2018-10-15 DIAGNOSIS — T82868A Thrombosis of vascular prosthetic devices, implants and grafts, initial encounter: Secondary | ICD-10-CM | POA: Diagnosis not present

## 2018-10-15 DIAGNOSIS — Z7951 Long term (current) use of inhaled steroids: Secondary | ICD-10-CM

## 2018-10-15 DIAGNOSIS — Z87891 Personal history of nicotine dependence: Secondary | ICD-10-CM | POA: Diagnosis not present

## 2018-10-15 DIAGNOSIS — Z7982 Long term (current) use of aspirin: Secondary | ICD-10-CM | POA: Diagnosis not present

## 2018-10-15 DIAGNOSIS — Z9862 Peripheral vascular angioplasty status: Secondary | ICD-10-CM | POA: Diagnosis not present

## 2018-10-15 DIAGNOSIS — D62 Acute posthemorrhagic anemia: Secondary | ICD-10-CM | POA: Diagnosis not present

## 2018-10-15 DIAGNOSIS — I998 Other disorder of circulatory system: Secondary | ICD-10-CM | POA: Diagnosis present

## 2018-10-15 DIAGNOSIS — I700 Atherosclerosis of aorta: Secondary | ICD-10-CM

## 2018-10-15 DIAGNOSIS — Z8249 Family history of ischemic heart disease and other diseases of the circulatory system: Secondary | ICD-10-CM

## 2018-10-15 DIAGNOSIS — Y838 Other surgical procedures as the cause of abnormal reaction of the patient, or of later complication, without mention of misadventure at the time of the procedure: Secondary | ICD-10-CM | POA: Diagnosis present

## 2018-10-15 DIAGNOSIS — Z7901 Long term (current) use of anticoagulants: Secondary | ICD-10-CM | POA: Diagnosis not present

## 2018-10-15 DIAGNOSIS — I70229 Atherosclerosis of native arteries of extremities with rest pain, unspecified extremity: Secondary | ICD-10-CM

## 2018-10-15 DIAGNOSIS — Z79899 Other long term (current) drug therapy: Secondary | ICD-10-CM | POA: Diagnosis not present

## 2018-10-15 DIAGNOSIS — I10 Essential (primary) hypertension: Secondary | ICD-10-CM | POA: Diagnosis not present

## 2018-10-15 DIAGNOSIS — I70221 Atherosclerosis of native arteries of extremities with rest pain, right leg: Secondary | ICD-10-CM | POA: Diagnosis not present

## 2018-10-15 DIAGNOSIS — Z825 Family history of asthma and other chronic lower respiratory diseases: Secondary | ICD-10-CM | POA: Diagnosis not present

## 2018-10-15 DIAGNOSIS — T82856A Stenosis of peripheral vascular stent, initial encounter: Secondary | ICD-10-CM | POA: Diagnosis not present

## 2018-10-15 DIAGNOSIS — Z9889 Other specified postprocedural states: Secondary | ICD-10-CM | POA: Diagnosis not present

## 2018-10-15 HISTORY — PX: LOWER EXTREMITY ANGIOGRAPHY: CATH118251

## 2018-10-15 LAB — CBC
HCT: 25.3 % — ABNORMAL LOW (ref 39.0–52.0)
HCT: 24 % — ABNORMAL LOW (ref 39.0–52.0)
HCT: 22.3 % — ABNORMAL LOW (ref 39.0–52.0)
Hemoglobin: 8.1 g/dL — ABNORMAL LOW (ref 13.0–17.0)
Hemoglobin: 7.8 g/dL — ABNORMAL LOW (ref 13.0–17.0)
Hemoglobin: 7.2 g/dL — ABNORMAL LOW (ref 13.0–17.0)
MCH: 29.8 pg (ref 26.0–34.0)
MCH: 29.4 pg (ref 26.0–34.0)
MCH: 29.4 pg (ref 26.0–34.0)
MCHC: 32 g/dL (ref 30.0–36.0)
MCHC: 32.5 g/dL (ref 30.0–36.0)
MCHC: 32.3 g/dL (ref 30.0–36.0)
MCV: 93 fL (ref 80.0–100.0)
MCV: 90.6 fL (ref 80.0–100.0)
MCV: 91 fL (ref 80.0–100.0)
Platelets: 348 10*3/uL (ref 150–400)
Platelets: 256 10*3/uL (ref 150–400)
Platelets: 211 10*3/uL (ref 150–400)
RBC: 2.72 MIL/uL — ABNORMAL LOW (ref 4.22–5.81)
RBC: 2.65 MIL/uL — ABNORMAL LOW (ref 4.22–5.81)
RBC: 2.45 MIL/uL — ABNORMAL LOW (ref 4.22–5.81)
RDW: 13.1 % (ref 11.5–15.5)
RDW: 12.9 % (ref 11.5–15.5)
RDW: 13.1 % (ref 11.5–15.5)
WBC: 16.9 10*3/uL — ABNORMAL HIGH (ref 4.0–10.5)
WBC: 14 10*3/uL — ABNORMAL HIGH (ref 4.0–10.5)
WBC: 9.5 10*3/uL (ref 4.0–10.5)
nRBC: 0 % (ref 0.0–0.2)
nRBC: 0 % (ref 0.0–0.2)
nRBC: 0 % (ref 0.0–0.2)

## 2018-10-15 LAB — GLUCOSE, CAPILLARY: Glucose-Capillary: 129 mg/dL — ABNORMAL HIGH (ref 70–99)

## 2018-10-15 LAB — CREATININE, SERUM
Creatinine, Ser: 1.51 mg/dL — ABNORMAL HIGH (ref 0.61–1.24)
GFR calc Af Amer: 57 mL/min — ABNORMAL LOW (ref >60)
GFR calc non Af Amer: 49 mL/min — ABNORMAL LOW (ref >60)

## 2018-10-15 LAB — HEPARIN LEVEL (UNFRACTIONATED)
Heparin Unfractionated: 0.44 IU/mL (ref 0.30–0.70)
Heparin Unfractionated: 0.34 IU/mL (ref 0.30–0.70)

## 2018-10-15 LAB — FIBRINOGEN
Fibrinogen: 308 mg/dL (ref 210–475)
Fibrinogen: 173 mg/dL — ABNORMAL LOW (ref 210–475)

## 2018-10-15 LAB — BUN: BUN: 16 mg/dL (ref 8–23)

## 2018-10-15 SURGERY — LOWER EXTREMITY ANGIOGRAPHY
Anesthesia: Moderate Sedation | Laterality: Right

## 2018-10-15 MED ORDER — HEPARIN (PORCINE) 25000 UT/250ML-% IV SOLN
600.0000 [IU]/h | INTRAVENOUS | Status: DC
  Administered 2018-10-15: 12:00:00 600 [IU]/h via INTRAVENOUS

## 2018-10-15 MED ORDER — LABETALOL HCL 5 MG/ML IV SOLN
INTRAVENOUS | Status: AC
  Administered 2018-10-15: 10:00:00 10 mg via INTRAVENOUS
  Filled 2018-10-15: qty 4

## 2018-10-15 MED ORDER — FENTANYL CITRATE (PF) 100 MCG/2ML IJ SOLN
INTRAMUSCULAR | Status: AC
  Administered 2018-10-15: 16:00:00
  Filled 2018-10-15: qty 2

## 2018-10-15 MED ORDER — LABETALOL HCL 5 MG/ML IV SOLN
10.0000 mg | Freq: Once | INTRAVENOUS | Status: AC
  Administered 2018-10-15 (×2): 10 mg via INTRAVENOUS

## 2018-10-15 MED ORDER — LIDOCAINE-EPINEPHRINE (PF) 1 %-1:200000 IJ SOLN
INTRAMUSCULAR | Status: AC
  Administered 2018-10-15: 16:00:00
  Filled 2018-10-15: qty 30

## 2018-10-15 MED ORDER — HEPARIN (PORCINE) 25000 UT/250ML-% IV SOLN
INTRAVENOUS | Status: AC
  Administered 2018-10-15: 600 [IU]/h via INTRAVENOUS
  Filled 2018-10-15: qty 250

## 2018-10-15 MED ORDER — MIDAZOLAM HCL 2 MG/2ML IJ SOLN
INTRAMUSCULAR | Status: AC
  Filled 2018-10-15: qty 2

## 2018-10-15 MED ORDER — ATORVASTATIN CALCIUM 20 MG PO TABS: 20.0000 mg | ORAL_TABLET | Freq: Every day | ORAL | Status: DC

## 2018-10-15 MED ORDER — CEFAZOLIN SODIUM-DEXTROSE 2-4 GM/100ML-% IV SOLN
2.0000 g | Freq: Once | INTRAVENOUS | Status: AC
  Administered 2018-10-15 (×2): 2 g via INTRAVENOUS

## 2018-10-15 MED ORDER — ONDANSETRON HCL 4 MG PO TABS
4.0000 mg | ORAL_TABLET | Freq: Four times a day (QID) | ORAL | Status: DC | PRN
  Filled 2018-10-15: qty 1

## 2018-10-15 MED ORDER — SODIUM CHLORIDE 0.9 % IV SOLN
INTRAVENOUS | Status: DC
  Administered 2018-10-15: 12:00:00 via INTRAVENOUS

## 2018-10-15 MED ORDER — HYDROMORPHONE HCL 1 MG/ML IJ SOLN
INTRAMUSCULAR | Status: AC
  Administered 2018-10-15: 12:00:00 1 mg via INTRAVENOUS
  Filled 2018-10-15: qty 1

## 2018-10-15 MED ORDER — FAMOTIDINE 20 MG PO TABS: 40.0000 mg | ORAL_TABLET | Freq: Once | ORAL | Status: DC | PRN

## 2018-10-15 MED ORDER — HYDROCODONE-ACETAMINOPHEN 5-325 MG PO TABS: 1.0000 | ORAL_TABLET | Freq: Four times a day (QID) | ORAL | Status: DC | PRN

## 2018-10-15 MED ORDER — OXYCODONE-ACETAMINOPHEN 5-325 MG PO TABS
1.0000 | ORAL_TABLET | ORAL | Status: DC | PRN
  Administered 2018-10-15 – 2018-10-17 (×4): 2 via ORAL
  Administered 2018-10-18: 1 via ORAL
  Administered 2018-10-18 – 2018-10-19 (×3): 2 via ORAL
  Filled 2018-10-15 (×7): qty 2
  Filled 2018-10-15 (×2): qty 1

## 2018-10-15 MED ORDER — ACETAMINOPHEN 325 MG PO TABS: 650.0000 mg | ORAL_TABLET | Freq: Four times a day (QID) | ORAL | Status: DC | PRN

## 2018-10-15 MED ORDER — DOCUSATE SODIUM 100 MG PO CAPS: 100.0000 mg | ORAL_CAPSULE | Freq: Two times a day (BID) | ORAL | Status: DC

## 2018-10-15 MED ORDER — DIPHENHYDRAMINE HCL 50 MG/ML IJ SOLN
INTRAMUSCULAR | Status: AC
  Administered 2018-10-15: 50 mg via INTRAVENOUS
  Filled 2018-10-15: qty 1

## 2018-10-15 MED ORDER — MIDAZOLAM HCL 5 MG/5ML IJ SOLN
INTRAMUSCULAR | Status: AC
  Administered 2018-10-15: 16:00:00
  Filled 2018-10-15: qty 5

## 2018-10-15 MED ORDER — SODIUM CHLORIDE 0.9 % IV SOLN
1.0000 mg/h | INTRAVENOUS | Status: AC
  Filled 2018-10-15: qty 10

## 2018-10-15 MED ORDER — IOPAMIDOL (ISOVUE-300) INJECTION 61%
INTRAVENOUS | Status: DC | PRN
  Administered 2018-10-15: 50 mL via INTRAVENOUS

## 2018-10-15 MED ORDER — HYDRALAZINE HCL 20 MG/ML IJ SOLN
10.0000 mg | INTRAMUSCULAR | Status: DC | PRN
  Administered 2018-10-15 – 2018-10-17 (×2): 20 mg via INTRAVENOUS
  Administered 2018-10-18: 07:00:00 10 mg via INTRAVENOUS
  Administered 2018-10-19 (×2): 20 mg via INTRAVENOUS
  Filled 2018-10-15 (×5): qty 1

## 2018-10-15 MED ORDER — SODIUM CHLORIDE 0.9 % IV SOLN
0.5000 mg/h | INTRAVENOUS | Status: DC
  Filled 2018-10-15: qty 10

## 2018-10-15 MED ORDER — HYDROMORPHONE HCL 1 MG/ML IJ SOLN
INTRAMUSCULAR | Status: AC
  Administered 2018-10-15: 1 mg via INTRAVENOUS
  Filled 2018-10-15: qty 1

## 2018-10-15 MED ORDER — HEPARIN SODIUM (PORCINE) 1000 UNIT/ML IJ SOLN
INTRAMUSCULAR | Status: DC | PRN
  Administered 2018-10-15: 16:00:00
  Administered 2018-10-15: 4000 [IU] via INTRAVENOUS

## 2018-10-15 MED ORDER — DEXMEDETOMIDINE HCL IN NACL 400 MCG/100ML IV SOLN
0.4000 ug/kg/h | INTRAVENOUS | Status: DC
  Filled 2018-10-15: qty 100

## 2018-10-15 MED ORDER — SODIUM CHLORIDE 0.9 % IV SOLN: 250.0000 mL | INTRAVENOUS | Status: DC | PRN

## 2018-10-15 MED ORDER — FLEET ENEMA 7-19 GM/118ML RE ENEM: 1.0000 | ENEMA | Freq: Once | RECTAL | Status: DC | PRN

## 2018-10-15 MED ORDER — SODIUM CHLORIDE 0.9 % IV SOLN
INTRAVENOUS | Status: DC
  Administered 2018-10-15: 10:00:00 via INTRAVENOUS

## 2018-10-15 MED ORDER — MORPHINE SULFATE (PF) 2 MG/ML IV SOLN
INTRAVENOUS | Status: AC
  Administered 2018-10-15: 2 mg via INTRAVENOUS
  Filled 2018-10-15: qty 1

## 2018-10-15 MED ORDER — DIPHENHYDRAMINE HCL 50 MG/ML IJ SOLN
50.0000 mg | Freq: Once | INTRAMUSCULAR | Status: DC | PRN
  Administered 2018-10-15: 16:00:00 50 mg via INTRAVENOUS

## 2018-10-15 MED ORDER — FENTANYL CITRATE (PF) 100 MCG/2ML IJ SOLN
INTRAMUSCULAR | Status: DC | PRN
  Administered 2018-10-15: 25 ug via INTRAVENOUS
  Administered 2018-10-15: 50 ug via INTRAVENOUS
  Administered 2018-10-15: 16:00:00
  Administered 2018-10-15: 50 ug via INTRAVENOUS
  Administered 2018-10-15: 16:00:00

## 2018-10-15 MED ORDER — MAGNESIUM HYDROXIDE 400 MG/5ML PO SUSP: 30.0000 mL | Freq: Every day | ORAL | Status: DC | PRN

## 2018-10-15 MED ORDER — HEPARIN SODIUM (PORCINE) 1000 UNIT/ML IJ SOLN
INTRAMUSCULAR | Status: AC
  Administered 2018-10-15: 16:00:00
  Filled 2018-10-15: qty 1

## 2018-10-15 MED ORDER — METHYLPREDNISOLONE SODIUM SUCC 125 MG IJ SOLR: 125.0000 mg | Freq: Once | INTRAMUSCULAR | Status: DC | PRN

## 2018-10-15 MED ORDER — MIDAZOLAM HCL 2 MG/ML PO SYRP: 8.0000 mg | ORAL_SOLUTION | Freq: Once | ORAL | Status: DC | PRN

## 2018-10-15 MED ORDER — FENTANYL CITRATE (PF) 250 MCG/5ML IJ SOLN
INTRAMUSCULAR | Status: AC
  Filled 2018-10-15: qty 5

## 2018-10-15 MED ORDER — ACETAMINOPHEN 650 MG RE SUPP
650.0000 mg | Freq: Four times a day (QID) | RECTAL | Status: DC | PRN
  Filled 2018-10-15: qty 1

## 2018-10-15 MED ORDER — CEFAZOLIN SODIUM-DEXTROSE 2-4 GM/100ML-% IV SOLN: 2.0000 g | INTRAVENOUS | Status: DC

## 2018-10-15 MED ORDER — FLUTICASONE-UMECLIDIN-VILANT 100-62.5-25 MCG/INH IN AEPB: 1.0000 | INHALATION_SPRAY | Freq: Every day | RESPIRATORY_TRACT | Status: DC

## 2018-10-15 MED ORDER — MORPHINE SULFATE (PF) 2 MG/ML IV SOLN
2.0000 mg | Freq: Once | INTRAVENOUS | Status: AC
  Administered 2018-10-15: 2 mg via INTRAVENOUS

## 2018-10-15 MED ORDER — HYDRALAZINE HCL 20 MG/ML IJ SOLN
10.0000 mg | Freq: Once | INTRAMUSCULAR | Status: AC
  Administered 2018-10-15: 13:00:00 10 mg via INTRAVENOUS

## 2018-10-15 MED ORDER — HYDROMORPHONE HCL 1 MG/ML IJ SOLN
1.0000 mg | INTRAMUSCULAR | Status: DC | PRN
  Administered 2018-10-15 – 2018-10-16 (×3): 1 mg via INTRAVENOUS
  Filled 2018-10-15 (×3): qty 1

## 2018-10-15 MED ORDER — LISINOPRIL 10 MG PO TABS: 20.0000 mg | ORAL_TABLET | Freq: Every day | ORAL | Status: DC

## 2018-10-15 MED ORDER — DEXTROSE-NACL 5-0.9 % IV SOLN: INTRAVENOUS | Status: DC

## 2018-10-15 MED ORDER — SODIUM CHLORIDE FLUSH 0.9 % IV SOLN
INTRAVENOUS | Status: AC
  Administered 2018-10-15: 16:00:00 via INTRAVENOUS
  Filled 2018-10-15: qty 50

## 2018-10-15 MED ORDER — HYDRALAZINE HCL 20 MG/ML IJ SOLN
INTRAMUSCULAR | Status: AC
  Administered 2018-10-15: 10 mg via INTRAVENOUS
  Filled 2018-10-15: qty 1

## 2018-10-15 MED ORDER — ASPIRIN EC 81 MG PO TBEC: 81.0000 mg | DELAYED_RELEASE_TABLET | Freq: Every day | ORAL | Status: DC

## 2018-10-15 MED ORDER — ONDANSETRON HCL 4 MG/2ML IJ SOLN: 4.0000 mg | Freq: Four times a day (QID) | INTRAMUSCULAR | Status: DC | PRN

## 2018-10-15 MED ORDER — SODIUM CHLORIDE 0.9 % IV SOLN
1.0000 mg/h | Freq: Once | INTRAVENOUS | Status: DC
  Administered 2018-10-15: 1 mg/h via INTRAVENOUS
  Filled 2018-10-15: qty 24

## 2018-10-15 MED ORDER — SODIUM CHLORIDE 0.9% FLUSH
3.0000 mL | Freq: Two times a day (BID) | INTRAVENOUS | Status: DC
  Administered 2018-10-15: 21:00:00 3 mL via INTRAVENOUS
  Administered 2018-10-15: 16:00:00 via INTRAVENOUS

## 2018-10-15 MED ORDER — VITAMIN B-12 1000 MCG SL SUBL: 1.0000 | SUBLINGUAL_TABLET | Freq: Every day | SUBLINGUAL | Status: DC

## 2018-10-15 MED ORDER — CEFAZOLIN SODIUM-DEXTROSE 2-4 GM/100ML-% IV SOLN
INTRAVENOUS | Status: AC
  Administered 2018-10-15: 10:00:00 2 g via INTRAVENOUS
  Filled 2018-10-15: qty 100

## 2018-10-15 MED ORDER — SODIUM CHLORIDE 0.9% FLUSH: 3.0000 mL | INTRAVENOUS | Status: DC | PRN

## 2018-10-15 MED ORDER — SORBITOL 70 % SOLN
30.0000 mL | Freq: Every day | Status: DC | PRN
  Filled 2018-10-15: qty 30

## 2018-10-15 MED ORDER — HYDROMORPHONE HCL 1 MG/ML IJ SOLN
1.0000 mg | Freq: Once | INTRAMUSCULAR | Status: AC | PRN
  Administered 2018-10-15: 12:00:00 1 mg via INTRAVENOUS

## 2018-10-15 MED ORDER — HYDROMORPHONE HCL 1 MG/ML IJ SOLN
1.0000 mg | Freq: Once | INTRAMUSCULAR | Status: AC
  Administered 2018-10-15: 13:00:00 1 mg via INTRAVENOUS

## 2018-10-15 MED ORDER — MIDAZOLAM HCL 2 MG/2ML IJ SOLN
INTRAMUSCULAR | Status: DC | PRN
  Administered 2018-10-15: 16:00:00
  Administered 2018-10-15: 1 mg via INTRAVENOUS
  Administered 2018-10-15 (×2): 2 mg via INTRAVENOUS

## 2018-10-15 MED ORDER — ALTEPLASE 2 MG IJ SOLR
INTRAMUSCULAR | Status: AC
  Administered 2018-10-15: 8 mg
  Filled 2018-10-15: qty 8

## 2018-10-15 MED ORDER — HYDROCODONE-ACETAMINOPHEN 5-325 MG PO TABS: 1.0000 | ORAL_TABLET | ORAL | Status: DC | PRN

## 2018-10-15 SURGICAL SUPPLY — 14 items
CANISTER PENUMBRA ENGINE (MISCELLANEOUS) ×2 IMPLANT
CATH BEACON 5 .035 65 RIM TIP (CATHETERS) ×2 IMPLANT
CATH BEACON 5 .038 100 VERT TP (CATHETERS) ×2 IMPLANT
CATH INDIGO CAT6 KIT (CATHETERS) ×2 IMPLANT
CATH INFUS 135CMX50CM (CATHETERS) ×2 IMPLANT
COVER PROBE U/S 5X48 (MISCELLANEOUS) ×2 IMPLANT
GLIDEWIRE ADV .035X260CM (WIRE) ×2 IMPLANT
KIT CATH CVC 3 LUMEN 7FR 8IN (MISCELLANEOUS) ×2 IMPLANT
PACK ANGIOGRAPHY (CUSTOM PROCEDURE TRAY) ×2 IMPLANT
SHEATH BRITE TIP 6FR X 23 (SHEATH) ×2 IMPLANT
SHEATH DESTIN RDC 6FR 45 (SHEATH) ×2 IMPLANT
TOWEL OR 17X26 4PK STRL BLUE (TOWEL DISPOSABLE) ×2 IMPLANT
TUBING CONTRAST HIGH PRESS 72 (TUBING) ×2 IMPLANT
WIRE G V18X300CM (WIRE) ×2 IMPLANT

## 2018-10-15 NOTE — Progress Notes (Signed)
Pt. Arrived from Vascular lab , restless, c/o Severe "shooting " pain to right thigh . Pt. Attempting to sit up and grabbing at right thigh. Pt. Med. With Dilaudid 1.0 mg IVP at 11:35 AM. Absent pulses to right foot. Lysis catheter and triple lumen catheter clean, dry, intact to left femoral site. Pt. Restful at 11:45 Am with eyes closed. In no acute distress. Heparin gtt. Started to Left groin site per orders. Waiting on TPA to arrive from pharmacy.

## 2018-10-15 NOTE — Progress Notes (Signed)
BP: 227/124-HR 79-16. Orders received for Hydralazine QT:TCNGF now. Pt. Calmer post 2nd dose of Dilaudid IV. Resting with eyes closed; occ. Attempting to sit up. Pt. HOB up 15 degrees: legs elevated.

## 2018-10-15 NOTE — H&P (Signed)
[facility] History & Physical Update  The patient was interviewed and re-examined.  The patient's previous History and Physical has been reviewed and is unchanged.  There is no change in the plan of care. We plan to proceed with the scheduled procedure.  Leotis Pain, MD  10/15/2018, 8:55 AM

## 2018-10-15 NOTE — Progress Notes (Signed)
Call to MD re: pt. C/o severe pain to right leg (knee to foot). Lamar Benes, RN called and spoke with Dr. Lucky Cowboy. Additional orders received.

## 2018-10-15 NOTE — Op Note (Signed)
[facility]  Percutaneous Study/Intervention Procedural Note   Date of Surgery: 10/15/2018  Surgeon(s):DEW,JASON    Assistants:none  Pre-operative Diagnosis: PAD with rest pain right lower extremity, acute on chronic ischemia  Post-operative diagnosis:  Same  Procedure(s) Performed:             1.  Ultrasound guidance for vascular access left femoral artery             2.  Catheter placement into right common femoral artery from left femoral approach             3.  Selective right lower extremity angiogram             4.   Catheter directed thrombolytic therapy with 8 mg of TPA instilled into the right SFA, popliteal artery, tibioperoneal trunk, and posterior tibial arteries             5.   Mechanical thrombectomy with the penumbra cat 6 device to the right SFA, popliteal artery, tibioperoneal trunk, and posterior tibial artery  6.  Placement of a continuous infusion catheter for overnight thrombolytic therapy using a 135 cm total length 50 cm working length catheter in the right SFA, popliteal artery, tibioperoneal trunk, and proximal posterior tibial arteries             7.   Placement of the left femoral venous triple-lumen catheter with ultrasound and fluoroscopic guidance  EBL: 300 cc  Contrast: 50 cc  Fluoro Time: 5.6 minutes  Moderate Conscious Sedation Time: approximately 45 minutes using 5 mg of Versed and 125 Mcg of Fentanyl              Indications:  Patient is a 62 y.o.male with recurrent limb threatening ischemia of the right leg. The patient has noninvasive study showing occlusion of his previous interventions with a critically reduced ABI of 0.3 on the right. The patient is brought in for angiography for further evaluation and potential treatment.  Due to the limb threatening nature of the situation, angiogram was performed for attempted limb salvage. The patient is aware that if the procedure fails, amputation would be expected.  The patient  also understands that even with successful revascularization, amputation may still be required due to the severity of the situation.  Risks and benefits are discussed and informed consent is obtained.   Procedure:  The patient was identified and appropriate procedural time out was performed.  The patient was then placed supine on the table and prepped and draped in the usual sterile fashion. Moderate conscious sedation was administered during a face to face encounter with the patient throughout the procedure with my supervision of the RN administering medicines and monitoring the patient's vital signs, pulse oximetry, telemetry and mental status throughout from the start of the procedure until the patient was taken to the recovery room. Ultrasound was used to evaluate the left common femoral artery.  It was patent .  A digital ultrasound image was acquired.  A Seldinger needle was used to access the left common femoral artery under direct ultrasound guidance and a permanent image was performed.  A 0.035 J wire was advanced without resistance and a 5Fr sheath was placed.   Aortogram was not performed as one was essentially normal earlier this month.  I then crossed the aortic bifurcation and advanced to the right femoral head in the right common femoral artery. Selective right lower extremity angiogram was then performed. This demonstrated recurrent flush occlusion of  the right SFA with decent flow in the profunda femoris artery and common femoral arteries.  The stents in the SFA and popliteal artery were occluded.  There appeared to be very faint reconstitution of the posterior tibial artery distally although opacification was reasonably poor due to the sluggish flow. It was felt that it was in the patient's best interest to proceed with intervention after these images to avoid a second procedure and a larger amount of contrast and fluoroscopy based off of the findings from the initial angiogram. The patient was  systemically heparinized and a 6 [place] destination sheath was then placed over the Terumo Advantage wire. I then used a Kumpe catheter and the advantage wire to easily navigate through the occluded SFA and popliteal arteries and get all the way down into the posterior tibial artery in the midsegment where there was continuous flow distally.  We then placed a 0.018 wire after using the Kumpe catheter to instill 8 mg of TPA from the posterior tibial artery up through the tibioperoneal trunk, popliteal artery, and entire SFA.  After this was allowed to dwell, the penumbra cat 6 device was brought onto the field and mechanical thrombectomy was performed.  Multiple passes were made from the right SFA, popliteal artery, and down into the tibioperoneal trunk and posterior tibial arteries.  Multiple large chunks of thrombus were removed and there appeared to be a significant volume of thrombectomy performed.  After about 300 cc of effluent returned, imaging was performed which showed persistent thrombosis with poor flow distally and essentially no blood flow getting downstream from the injection in the sheath.  It was clear that the patient was going to need continuous thrombolytic therapy and at this point I selected a 135 cm total length 50 cm working length catheter.  The most proximal portion of this catheter was in the proximal SFA and the distal extent went down through the popliteal artery and the tibioperoneal trunk and was in the proximal posterior tibial artery within the previously placed stent.  This was secured in the place with a silk suture as well as a 23 cm Cordis sheath (that was placed with removal of the destination sheath) that terminated in the right iliac system.  With continuous thrombolytic therapy, durable venous access would be necessary and a central line was placed.  Since the groin was prepped, the left femoral vein was visualized with ultrasound and found to be patent.  It was accessed under  direct ultrasound guidance without difficulty with a Seldinger needle.  A permanent image was recorded.  A J-wire was placed.  After skin nick and dilatation a triple-lumen catheter was placed over the wire and the wire was removed.  All 3 lumens withdrew dark red blood and flushed easily with sterile saline.  Fluoroscopy showed the catheter tip to be in the distal inferior vena cava. I elected to terminate the procedure. The patient was taken to the recovery room in stable condition having tolerated the procedure well.  Findings:                            Right lower Extremity:  This demonstrated recurrent flush occlusion of the right SFA with decent flow in the profunda femoris artery and common femoral arteries.  The stents in the SFA and popliteal artery were occluded.  There appeared to be very faint reconstitution of the posterior tibial artery distally although opacification was reasonably poor due to the  sluggish flow.   Disposition: Patient was taken to the recovery room in stable condition having tolerated the procedure well.  Complications: None  Leotis Pain 10/15/2018 11:18 AM   This note was created with Dragon Medical transcription system. Any errors in dictation are purely unintentional.

## 2018-10-15 NOTE — Consult Note (Signed)
Name: Ryan [name] MRN: [id number] DOB: 01-10-1956    ADMISSION DATE:  10/15/2018 CONSULTATION DATE: 10/15/2018  REFERRING MD : Dr. Lucky [name]   CHIEF COMPLAINT: PAD with rest pain of right lower extremity  BRIEF PATIENT DESCRIPTION:  63 yo male admitted with PAD resulting in right lower extremity rest pain s/p angiogram TPA to infuse overnight   SIGNIFICANT EVENTS/STUDIES:  04/23-Pt admitted to ICU s/p right lower extremity angiogram   HISTORY OF PRESENT ILLNESS:   This is 63 yo male admitted to [facility] on 04/23 with PAD resulting in right lower extremity rest pain and underwent right lower extremity angiogram on 04/23.  Findings were recurrent flush occlusion of the right SFA with decent flow in the profunda femoris artery and common femoral arteries; the stents in the SFA and popliteal artery were occluded, and there appeared to be very faint reconstitution of the posterior tibial artery distally although opacification was reasonably poor due to the sluggish flow.  Therefore, pt admitted to ICU post procedure for TPA infusion overnight with plans for right lower extremity angiogram on 04/24.   PAST MEDICAL HISTORY :   has a past medical history of Hypertension, Neuromuscular disorder ([place]), Peripheral vascular disease ([place]), and Shortness of breath dyspnea.  has a past surgical history that includes Colonoscopy with propofol (N/A, 02/15/2016); polypectomy (N/A, 02/15/2016); Hernia repair (1999); Lower Extremity Angiography (Right, 05/07/2018); Lower Extremity Angiography (Right, 06/25/2018); Lower Extremity Angiography (Right, 07/01/2018); LOWER EXTREMITY INTERVENTION (Right, 07/02/2018); Lower Extremity Angiography (Right, 08/12/2018); Lower Extremity Angiography (Right, 09/03/2018); Lower Extremity Angiography (Right, 09/04/2018); Lower Extremity Angiography (Right, 10/01/2018); and Lower Extremity Angiography (Right, 10/01/2018). Prior to Admission medications   Medication Sig Start Date End Date Taking?  Authorizing Provider  apixaban (ELIQUIS) 5 MG TABS tablet Take 1 tablet (5 mg total) by mouth 2 (two) times daily. 10/02/18  Yes Schnier, Dolores Lory, MD  aspirin EC 81 MG tablet Take 1 tablet (81 mg total) by mouth daily. 08/13/18  Yes Stegmayer, Joelene Millin A, PA-C  atorvastatin (LIPITOR) 20 MG tablet Take 1 tablet (20 mg total) by mouth daily. 08/13/18  Yes Stegmayer, Janalyn Harder, PA-C  Cyanocobalamin (VITAMIN B-12) 1000 MCG SUBL Place 1 tablet (1,000 mcg total) under the tongue daily. 04/30/18  Yes Sowles, Drue Stager, MD  Fluticasone-Umeclidin-Vilant (TRELEGY ELLIPTA) 100-62.5-25 MCG/INH AEPB Inhale 1 puff into the lungs daily. In place of Anoro 07/21/18  Yes Sowles, Drue Stager, MD  HYDROcodone-acetaminophen (NORCO) 5-325 MG tablet Take 1-2 tablets by mouth every 6 (six) hours as needed for moderate pain or severe pain. 10/02/18  Yes Schnier, Dolores Lory, MD  lisinopril (PRINIVIL,ZESTRIL) 10 MG tablet Take 1 tablet (10 mg total) by mouth daily. Patient taking differently: Take 20 mg by mouth daily.  07/21/18  Yes Sowles, Drue Stager, MD  nicotine (NICODERM CQ - DOSED IN MG/24 HOURS) 21 mg/24hr patch Place 1 patch (21 mg total) onto the skin daily. 08/14/18  Yes Stegmayer, Joelene Millin A, PA-C   No Known Allergies  FAMILY HISTORY:  family history includes COPD in his mother; Heart attack in his brother; Hypertension in his father. SOCIAL HISTORY:  reports that he quit smoking about 6 weeks ago. His smoking use included cigarettes. He started smoking about 40 years ago. He has a 4.00 pack-year smoking history. He has never used smokeless tobacco. He reports current alcohol use of about 12.0 standard drinks of alcohol per week. He reports that he does not use drugs.  REVIEW OF SYSTEMS: Positives in BOLD   Constitutional: Negative for fever, chills,  weight loss, malaise/fatigue and diaphoresis.  HENT: Negative for hearing loss, ear pain, nosebleeds, congestion, sore throat, neck pain, tinnitus and ear discharge.   Eyes:  Negative for blurred vision, double vision, photophobia, pain, discharge and redness.  Respiratory: Negative for cough, hemoptysis, sputum production, shortness of breath, wheezing and stridor.   Cardiovascular: Negative for chest pain, palpitations, orthopnea, claudication, leg swelling and PND.  Gastrointestinal: Negative for heartburn, nausea, vomiting, abdominal pain, diarrhea, constipation, blood in stool and melena.  Genitourinary: Negative for dysuria, urgency, frequency, hematuria and flank pain.  Musculoskeletal: right lower extremity pain, myalgias, back pain, joint pain and falls.  Skin: Negative for itching and rash.  Neurological: Negative for dizziness, tingling, tremors, sensory change, speech change, focal weakness, seizures, loss of consciousness, weakness and headaches.  Endo/Heme/Allergies: Negative for environmental allergies and polydipsia. Does not bruise/bleed easily.  SUBJECTIVE:  c/o mild right lower extremity pain   VITAL SIGNS: Temp:  [97.6 F (36.4 C)] 97.6 F (36.4 C) (04/23 1000) Pulse Rate:  [66-93] 75 (04/23 1300) Resp:  [8-21] 15 (04/23 1300) BP: (103-240)/(56-141) 160/120 (04/23 1308) SpO2:  [97 %-100 %] 100 % (04/23 1300) Weight:  [57.6 kg] 57.6 kg (04/23 1000)  PHYSICAL EXAMINATION: General: well developed, well nourished male, NAD  Neuro: lethargic, oriented, follows commands HEENT: supple, no JVD  Cardiovascular: nsr, rrr, no R/G, 2+ popliteal pulse via doppler, 2+ dorsalis pedis via doppler Lungs: clear throughout, even, non labored  Abdomen: +BS x4, soft, non tender, non distended  Musculoskeletal: normal bulk, no edema Skin: right foot cool to touch   Recent Labs  Lab 10/15/18 0941  BUN 16  CREATININE 1.51*   No results for input(s): HGB, HCT, WBC, PLT in the last 168 hours. No results found.  ASSESSMENT / PLAN:  PAD with right lower extremity rest pain s/p angiogram  Vascular surgery primary  Prn dilaudid and percocet for pain  management   Acute renal failure  Continuous telemetry monitoring  Trend BMP  Replace electrolytes as indicated  Monitor UOP Avoid nephrotoxic medications   Acute blood loss anemia  Continue TPA per vascular surgery recommendations  Trend CBC, fibrinogen, and heparin level  Monitor for s/sx of bleeding and transfuse for hgb <7  Marda Stalker, [place] Pager [phone] (please enter 7 digits) PCCM Consult Pager [phone] (please enter 7 digits)

## 2018-10-16 ENCOUNTER — Encounter
Admission: AD | Disposition: A | Discharge status: Home / Self Care | Payer: Self-pay | Source: Home / Self Care | Attending: Vascular Surgery

## 2018-10-16 DIAGNOSIS — I998 Other disorder of circulatory system: Secondary | ICD-10-CM

## 2018-10-16 DIAGNOSIS — I70221 Atherosclerosis of native arteries of extremities with rest pain, right leg: Secondary | ICD-10-CM

## 2018-10-16 HISTORY — PX: LOWER EXTREMITY ANGIOGRAPHY: CATH118251

## 2018-10-16 LAB — BASIC METABOLIC PANEL
Anion gap: 8 (ref 5–15)
BUN: 25 mg/dL — ABNORMAL HIGH (ref 8–23)
CO2: 22 mmol/L (ref 22–32)
Calcium: 8.3 mg/dL — ABNORMAL LOW (ref 8.9–10.3)
Chloride: 109 mmol/L (ref 98–111)
Creatinine, Ser: 2.81 mg/dL — ABNORMAL HIGH (ref 0.61–1.24)
GFR calc Af Amer: 27 mL/min — ABNORMAL LOW (ref >60)
GFR calc non Af Amer: 23 mL/min — ABNORMAL LOW (ref >60)
Glucose, Bld: 121 mg/dL — ABNORMAL HIGH (ref 70–99)
Potassium: 4.7 mmol/L (ref 3.5–5.1)
Sodium: 139 mmol/L (ref 135–145)

## 2018-10-16 LAB — CBC
HCT: 21.6 % — ABNORMAL LOW (ref 39.0–52.0)
HCT: 18.9 % — ABNORMAL LOW (ref 39.0–52.0)
Hemoglobin: 6.9 g/dL — ABNORMAL LOW (ref 13.0–17.0)
Hemoglobin: 6.1 g/dL — ABNORMAL LOW (ref 13.0–17.0)
MCH: 29.4 pg (ref 26.0–34.0)
MCH: 29.5 pg (ref 26.0–34.0)
MCHC: 31.9 g/dL (ref 30.0–36.0)
MCHC: 32.3 g/dL (ref 30.0–36.0)
MCV: 91.9 fL (ref 80.0–100.0)
MCV: 91.3 fL (ref 80.0–100.0)
Platelets: 206 10*3/uL (ref 150–400)
Platelets: 201 10*3/uL (ref 150–400)
RBC: 2.35 MIL/uL — ABNORMAL LOW (ref 4.22–5.81)
RBC: 2.07 MIL/uL — ABNORMAL LOW (ref 4.22–5.81)
RDW: 13.3 % (ref 11.5–15.5)
RDW: 13.5 % (ref 11.5–15.5)
WBC: 7.4 10*3/uL (ref 4.0–10.5)
WBC: 7.2 10*3/uL (ref 4.0–10.5)
nRBC: 0 % (ref 0.0–0.2)
nRBC: 0 % (ref 0.0–0.2)

## 2018-10-16 LAB — HEPARIN LEVEL (UNFRACTIONATED)
Heparin Unfractionated: 0.11 IU/mL — ABNORMAL LOW (ref 0.30–0.70)
Heparin Unfractionated: 0.12 IU/mL — ABNORMAL LOW (ref 0.30–0.70)

## 2018-10-16 LAB — FIBRINOGEN
Fibrinogen: 120 mg/dL — ABNORMAL LOW (ref 210–475)
Fibrinogen: 156 mg/dL — ABNORMAL LOW (ref 210–475)

## 2018-10-16 LAB — HEMOGLOBIN AND HEMATOCRIT, BLOOD
HCT: 21 % — ABNORMAL LOW (ref 39.0–52.0)
Hemoglobin: 7 g/dL — ABNORMAL LOW (ref 13.0–17.0)

## 2018-10-16 LAB — ABO/RH: ABO/RH(D): A POS

## 2018-10-16 LAB — MRSA PCR SCREENING: MRSA by PCR: NEGATIVE

## 2018-10-16 SURGERY — LOWER EXTREMITY ANGIOGRAPHY
Anesthesia: Moderate Sedation | Laterality: Left

## 2018-10-16 MED ORDER — FENTANYL CITRATE (PF) 100 MCG/2ML IJ SOLN
INTRAMUSCULAR | Status: DC | PRN
  Administered 2018-10-16 (×2): 25 ug via INTRAVENOUS
  Administered 2018-10-16: 50 ug via INTRAVENOUS

## 2018-10-16 MED ORDER — HEPARIN (PORCINE) IN NACL 1000-0.9 UT/500ML-% IV SOLN
INTRAVENOUS | Status: AC
  Administered 2018-10-16: 13:00:00
  Filled 2018-10-16: qty 1000

## 2018-10-16 MED ORDER — DIPHENHYDRAMINE HCL 50 MG/ML IJ SOLN
INTRAMUSCULAR | Status: DC | PRN
  Administered 2018-10-16: 25 mg via INTRAVENOUS
  Administered 2018-10-16: 13:00:00

## 2018-10-16 MED ORDER — SODIUM CHLORIDE 0.9% IV SOLUTION: Freq: Once | INTRAVENOUS | Status: DC

## 2018-10-16 MED ORDER — LACTATED RINGERS IV SOLN
INTRAVENOUS | Status: DC
  Administered 2018-10-16: 06:00:00 via INTRAVENOUS

## 2018-10-16 MED ORDER — HEPARIN SODIUM (PORCINE) 1000 UNIT/ML IJ SOLN
INTRAMUSCULAR | Status: DC | PRN
  Administered 2018-10-16: 3000 [IU] via INTRAVENOUS

## 2018-10-16 MED ORDER — CEFAZOLIN SODIUM-DEXTROSE 2-4 GM/100ML-% IV SOLN
2.0000 g | Freq: Once | INTRAVENOUS | Status: AC
  Administered 2018-10-16: 2 g via INTRAVENOUS

## 2018-10-16 MED ORDER — TIROFIBAN (AGGRASTAT) BOLUS VIA INFUSION
25.0000 ug/kg | Freq: Once | INTRAVENOUS | Status: AC
  Administered 2018-10-16: 1440 ug via INTRAVENOUS
  Filled 2018-10-16: qty 29

## 2018-10-16 MED ORDER — SODIUM CHLORIDE 0.9% IV SOLUTION
Freq: Once | INTRAVENOUS | Status: AC
  Administered 2018-10-16: 19:00:00 via INTRAVENOUS

## 2018-10-16 MED ORDER — NICOTINE 21 MG/24HR TD PT24
21.0000 mg | MEDICATED_PATCH | Freq: Every day | TRANSDERMAL | Status: DC
  Administered 2018-10-17 – 2018-10-19 (×3): 21 mg via TRANSDERMAL
  Filled 2018-10-16 (×3): qty 1

## 2018-10-16 MED ORDER — MELATONIN 5 MG PO TABS
5.0000 mg | ORAL_TABLET | Freq: Every evening | ORAL | Status: DC | PRN
  Administered 2018-10-16 – 2018-10-18 (×3): 5 mg via ORAL
  Filled 2018-10-16 (×4): qty 1

## 2018-10-16 MED ORDER — UMECLIDINIUM BROMIDE 62.5 MCG/INH IN AEPB
1.0000 | INHALATION_SPRAY | Freq: Every day | RESPIRATORY_TRACT | Status: DC
  Administered 2018-10-16 – 2018-10-19 (×4): 1 via RESPIRATORY_TRACT
  Filled 2018-10-16: qty 7

## 2018-10-16 MED ORDER — LIDOCAINE-EPINEPHRINE (PF) 1 %-1:200000 IJ SOLN
INTRAMUSCULAR | Status: AC
  Administered 2018-10-16: 09:00:00
  Filled 2018-10-16: qty 30

## 2018-10-16 MED ORDER — MIDAZOLAM HCL 5 MG/5ML IJ SOLN
INTRAMUSCULAR | Status: AC
  Filled 2018-10-16: qty 5

## 2018-10-16 MED ORDER — NON FORMULARY: 5.0000 mg | Freq: Every evening | Status: DC | PRN

## 2018-10-16 MED ORDER — VITAMIN B-12 1000 MCG PO TABS
1000.0000 ug | ORAL_TABLET | Freq: Every day | ORAL | Status: DC
  Administered 2018-10-16 – 2018-10-19 (×4): 1000 ug via ORAL
  Filled 2018-10-16 (×4): qty 1

## 2018-10-16 MED ORDER — IOHEXOL 300 MG/ML  SOLN
INTRAMUSCULAR | Status: DC | PRN
  Administered 2018-10-16: 40 mL via INTRAVENOUS

## 2018-10-16 MED ORDER — MIDAZOLAM HCL 2 MG/2ML IJ SOLN
INTRAMUSCULAR | Status: DC | PRN
  Administered 2018-10-16: 2 mg via INTRAVENOUS
  Administered 2018-10-16: 1 mg via INTRAVENOUS

## 2018-10-16 MED ORDER — LISINOPRIL 20 MG PO TABS
20.0000 mg | ORAL_TABLET | Freq: Every day | ORAL | Status: DC
  Administered 2018-10-16 – 2018-10-17 (×2): 20 mg via ORAL
  Filled 2018-10-16 (×2): qty 1

## 2018-10-16 MED ORDER — HEPARIN SODIUM (PORCINE) 1000 UNIT/ML IJ SOLN
INTRAMUSCULAR | Status: AC
  Filled 2018-10-16: qty 1

## 2018-10-16 MED ORDER — FLUTICASONE FUROATE-VILANTEROL 200-25 MCG/INH IN AEPB
1.0000 | INHALATION_SPRAY | Freq: Every day | RESPIRATORY_TRACT | Status: DC
  Administered 2018-10-16 – 2018-10-19 (×4): 1 via RESPIRATORY_TRACT
  Filled 2018-10-16: qty 28

## 2018-10-16 MED ORDER — RIVAROXABAN 15 MG PO TABS
15.0000 mg | ORAL_TABLET | Freq: Two times a day (BID) | ORAL | Status: DC
  Filled 2018-10-16: qty 1

## 2018-10-16 MED ORDER — TIROFIBAN HCL IN NACL 5-0.9 MG/100ML-% IV SOLN
0.1500 ug/kg/min | INTRAVENOUS | Status: AC
  Administered 2018-10-16: 12:00:00 0.15 ug/kg/min via INTRAVENOUS
  Filled 2018-10-16 (×3): qty 100

## 2018-10-16 MED ORDER — ATORVASTATIN CALCIUM 20 MG PO TABS
20.0000 mg | ORAL_TABLET | Freq: Every day | ORAL | Status: DC
  Administered 2018-10-16 – 2018-10-19 (×4): 20 mg via ORAL
  Filled 2018-10-16 (×4): qty 1

## 2018-10-16 MED ORDER — DIPHENHYDRAMINE HCL 50 MG/ML IJ SOLN
INTRAMUSCULAR | Status: AC
  Administered 2018-10-16: 13:00:00
  Filled 2018-10-16: qty 1

## 2018-10-16 MED ORDER — FLUTICASONE-UMECLIDIN-VILANT 100-62.5-25 MCG/INH IN AEPB: 1.0000 | INHALATION_SPRAY | Freq: Every day | RESPIRATORY_TRACT | Status: DC

## 2018-10-16 MED ORDER — ASPIRIN EC 81 MG PO TBEC
81.0000 mg | DELAYED_RELEASE_TABLET | Freq: Every day | ORAL | Status: DC
  Administered 2018-10-16 – 2018-10-19 (×4): 81 mg via ORAL
  Filled 2018-10-16 (×5): qty 1

## 2018-10-16 MED ORDER — FENTANYL CITRATE (PF) 100 MCG/2ML IJ SOLN
INTRAMUSCULAR | Status: AC
  Filled 2018-10-16: qty 4

## 2018-10-16 SURGICAL SUPPLY — 13 items
BALLN ULTRVRSE 2.5X300X150 (BALLOONS) ×2
BALLOON ULTRVRSE 2.5X300X150 (BALLOONS) IMPLANT
CANISTER PENUMBRA ENGINE (MISCELLANEOUS) ×1 IMPLANT
CATH BEACON 5 .038 100 VERT TP (CATHETERS) ×1 IMPLANT
CATH INDIGO CAT6 KIT (CATHETERS) ×1 IMPLANT
DEVICE PRESTO INFLATION (MISCELLANEOUS) ×1 IMPLANT
DEVICE SAFEGUARD 24CM (GAUZE/BANDAGES/DRESSINGS) ×1 IMPLANT
DEVICE STARCLOSE SE CLOSURE (Vascular Products) ×1 IMPLANT
PACK ANGIOGRAPHY (CUSTOM PROCEDURE TRAY) ×2 IMPLANT
SET INTRO CAPELLA COAXIAL (SET/KITS/TRAYS/PACK) ×1 IMPLANT
TOWEL OR 17X26 4PK STRL BLUE (TOWEL DISPOSABLE) ×1 IMPLANT
WIRE G V18X300CM (WIRE) ×1 IMPLANT
WIRE J 3MM .035X145CM (WIRE) ×1 IMPLANT

## 2018-10-16 NOTE — Progress Notes (Signed)
PAD changed d/t nonadherent tape and site oozing. 40cc air reapplied.

## 2018-10-16 NOTE — Op Note (Signed)
[name]  Percutaneous Study/Intervention Procedural Note   Date of Surgery: 10/16/2018  Surgeon(s):DEW,JASON    Assistants:none  Pre-operative Diagnosis: PAD with rest pain right foot  Post-operative diagnosis:  Same  Procedure(s) Performed:             1.  RLE angiogram             2.  Catheter placement into right PT and AT arteries from left femoral approach             3.  Mechanical thrombectomy with the Penumbra cat 6 device to the right common femoral artery , proximal SFA, and right anterior tibial artery             4.  Percutaneous transluminal angioplasty of anterior tibial artery with a 2.5 mm diameter by 30 cm length angioplasty balloon             5.  StarClose closure device left femoral artery  EBL: 150 cc  Contrast: 40 cc  Fluoro Time: 4.5 minutes  Moderate Conscious Sedation Time: approximately 30 minutes using 3 mg of Versed and 100 mcg of Fentanyl              Indications:  Patient is a 63 y.o.male with recurrent ischemic right leg.  He has been on overnight thrombolytic therapy and is brought back for second look angiography and possible revascularization.  Due to the limb threatening nature of the situation, angiogram was performed for attempted limb salvage. The patient is aware that if the procedure fails, amputation would be expected.  The patient also understands that even with successful revascularization, amputation may still be required due to the severity of the situation.  Risks and benefits are discussed and informed consent is obtained.   Procedure:  The patient was identified and appropriate procedural time out was performed.  The patient was then placed supine on the table and prepped and draped in the usual sterile fashion. Moderate conscious sedation was administered during a face to face encounter with the patient throughout the procedure with my supervision of the RN administering medicines and monitoring the patient's  vital signs, pulse oximetry, telemetry and mental status throughout from the start of the procedure until the patient was taken to the recovery room.  The existing thrombolytic catheter was removed and a V 18 wire was placed into the posterior tibial artery.  Imaging was initially done using the sheath that was in the right iliac system.  Selective right lower extremity angiogram was then performed. This demonstrated a small chunk of thrombus at the origin of the SFA and distal common femoral artery that was moderately occlusive at and just above the previously placed stents.  The remainder of the SFA and popliteal arteries were then patent.  The tibioperoneal trunk and posterior tibial artery stent that was previously placed was patent and the posterior tibial artery was continuous to the foot.  The anterior tibial artery occluded about 5 cm beyond its origin and did reconstitute at the ankle through collaterals. The peroneal artery was occluded. It was felt that it was in the patient's best interest to proceed with intervention after these images to avoid a second procedure and a larger amount of contrast and fluoroscopy based off of the findings from the initial angiogram. The patient was systemically heparinized.  I then used the penumbra cat 6 device and perform several passes in the right common femoral artery and proximal SFA.  Repeat imaging  showed resolution of the thrombus in this location with no significant residual stenosis.  I then turned my attention to the distal circulation.  I then used a Kumpe catheter and the V18 wire to navigate into the anterior tibial artery and cross the occlusion without difficulty.  I then perform selective imaging with the Kumpe catheter proximally in the anterior tibial artery.  To treat the occlusion, the mechanical thrombectomy penumbra cat 6 catheter was brought back onto the field and the proximal and mid portions of the anterior tibial artery were treated with  thrombectomy.  Some thrombus was removed but there remained occlusion in the proximal and mid segments with reconstitution distally.  I then elected to treat this with angioplasty.  A 2.5 mm diameter by 30 cm length angioplasty balloon was then inflated from the proximal anterior tibial artery taking care not to impinge on the origin of the tibioperoneal trunk or harm that vessel.  The balloon was entirely in the anterior tibial artery.  The distal portion of the balloon was just above the ankle.  This was inflated to 10 atm for 1 minute.  Completion imaging then showed inline flow to the foot through both the posterior tibial artery and anterior tibial arteries with less than 20% residual stenosis in the anterior tibial artery.  He now had two-vessel runoff distally. I elected to terminate the procedure. The sheath was removed and StarClose closure device was deployed in the left femoral artery with excellent hemostatic result. The patient was taken to the recovery room in stable condition having tolerated the procedure well.  Findings:                           Right lower Extremity:  This demonstrated a small chunk of thrombus at the origin of the SFA and distal common femoral artery at and just above the previously placed stents.  The remainder of the SFA and popliteal arteries were then patent.  The tibioperoneal trunk and posterior tibial artery stent that was previously placed was patent and the posterior tibial artery was continuous to the foot.  The anterior tibial artery occluded about 5 cm beyond its origin and did reconstitute at the ankle through collaterals. The peroneal artery was occluded   Disposition: Patient was taken to the recovery room in stable condition having tolerated the procedure well.  Complications: None  Leotis Pain 10/16/2018 9:30 AM   This note was created with Dragon Medical transcription system. Any errors in dictation are purely unintentional.

## 2018-10-17 LAB — CBC
HCT: 20.4 % — ABNORMAL LOW (ref 39.0–52.0)
HCT: 26.1 % — ABNORMAL LOW (ref 39.0–52.0)
Hemoglobin: 6.6 g/dL — ABNORMAL LOW (ref 13.0–17.0)
Hemoglobin: 8.8 g/dL — ABNORMAL LOW (ref 13.0–17.0)
MCH: 29.2 pg (ref 26.0–34.0)
MCH: 30 pg (ref 26.0–34.0)
MCHC: 32.4 g/dL (ref 30.0–36.0)
MCHC: 33.7 g/dL (ref 30.0–36.0)
MCV: 90.3 fL (ref 80.0–100.0)
MCV: 89.1 fL (ref 80.0–100.0)
Platelets: 167 10*3/uL (ref 150–400)
Platelets: 175 10*3/uL (ref 150–400)
RBC: 2.26 MIL/uL — ABNORMAL LOW (ref 4.22–5.81)
RBC: 2.93 MIL/uL — ABNORMAL LOW (ref 4.22–5.81)
RDW: 13.7 % (ref 11.5–15.5)
RDW: 13.9 % (ref 11.5–15.5)
WBC: 7.7 10*3/uL (ref 4.0–10.5)
WBC: 7.8 10*3/uL (ref 4.0–10.5)
nRBC: 0 % (ref 0.0–0.2)
nRBC: 0 % (ref 0.0–0.2)

## 2018-10-17 LAB — BASIC METABOLIC PANEL
Anion gap: 8 (ref 5–15)
BUN: 34 mg/dL — ABNORMAL HIGH (ref 8–23)
CO2: 21 mmol/L — ABNORMAL LOW (ref 22–32)
Calcium: 8 mg/dL — ABNORMAL LOW (ref 8.9–10.3)
Chloride: 109 mmol/L (ref 98–111)
Creatinine, Ser: 3.57 mg/dL — ABNORMAL HIGH (ref 0.61–1.24)
GFR calc Af Amer: 20 mL/min — ABNORMAL LOW (ref >60)
GFR calc non Af Amer: 17 mL/min — ABNORMAL LOW (ref >60)
Glucose, Bld: 105 mg/dL — ABNORMAL HIGH (ref 70–99)
Potassium: 4.5 mmol/L (ref 3.5–5.1)
Sodium: 138 mmol/L (ref 135–145)

## 2018-10-17 LAB — MAGNESIUM: Magnesium: 2 mg/dL (ref 1.7–2.4)

## 2018-10-17 LAB — PREPARE RBC (CROSSMATCH)

## 2018-10-17 LAB — HEPARIN LEVEL (UNFRACTIONATED): Heparin Unfractionated: 0.1 IU/mL — ABNORMAL LOW (ref 0.30–0.70)

## 2018-10-17 LAB — APTT: aPTT: 36 seconds (ref 24–36)

## 2018-10-17 LAB — PROTIME-INR
INR: 1.2 (ref 0.8–1.2)
Prothrombin Time: 14.6 seconds (ref 11.4–15.2)

## 2018-10-17 MED ORDER — HEPARIN BOLUS VIA INFUSION
1700.0000 [IU] | Freq: Once | INTRAVENOUS | Status: AC
  Administered 2018-10-17: 20:00:00 1700 [IU] via INTRAVENOUS
  Filled 2018-10-17: qty 1700

## 2018-10-17 MED ORDER — SODIUM CHLORIDE 0.9 % IV SOLN
INTRAVENOUS | Status: DC
  Administered 2018-10-17 – 2018-10-19 (×4): via INTRAVENOUS

## 2018-10-17 MED ORDER — HEPARIN (PORCINE) 25000 UT/250ML-% IV SOLN
1050.0000 [IU]/h | INTRAVENOUS | Status: DC
  Administered 2018-10-17: 12:00:00 700 [IU]/h via INTRAVENOUS
  Filled 2018-10-17 (×2): qty 250

## 2018-10-17 MED ORDER — SODIUM CHLORIDE 0.9% IV SOLUTION
Freq: Once | INTRAVENOUS | Status: AC
  Administered 2018-10-17: 05:00:00 via INTRAVENOUS

## 2018-10-17 NOTE — Progress Notes (Signed)
ANTICOAGULATION CONSULT NOTE - Initial Consult  Pharmacy Consult for Heparin Indication: PAD  No Known Allergies  Patient Measurements: Height: 5\' 8"  (172.7 cm) Weight: 126 lb 15.8 oz (57.6 kg) IBW/kg (Calculated) : 68.4 Heparin Dosing Weight: 57.6 kg  Vital Signs: Temp: 98.4 F (36.9 C) (04/25 1714) Temp Source: Oral (04/25 1714) BP: 163/82 (04/25 1714) Pulse Rate: 92 (04/25 1714)  Labs: Recent Labs    10/15/18 0941  10/15/18 2303 10/16/18 0448 10/16/18 1723 10/16/18 2312 10/17/18 0436 10/17/18 1046 10/17/18 1914  HGB  --    < > 7.2* 6.9* 6.1* 7.0* 6.6* 8.8*  --   HCT  --    < > 22.3* 21.6* 18.9* 21.0* 20.4* 26.1*  --   PLT  --    < > 211 206 201  --  167 175  --   APTT  --   --   --   --   --   --   --  36  --   LABPROT  --   --   --   --   --   --   --  14.6  --   INR  --   --   --   --   --   --   --  1.2  --   HEPARINUNFRC  --    < > 0.11* 0.12*  --   --   --   --  0.10*  CREATININE 1.51*  --   --  2.81*  --   --  3.57*  --   --    < > = values in this interval not displayed.    Estimated Creatinine Clearance: 17.7 mL/min (A) (by C-G formula based on SCr of 3.57 mg/dL (H)).   Medical History: Past Medical History:  Diagnosis Date  . Hypertension   . Neuromuscular disorder (HCC)    numbness feet  . Peripheral vascular disease ([place])   . Shortness of breath dyspnea      Assessment: 63 yo male due to receive Xarelto starting today with AKI and low Hgb. Discussed with ICU MD.  Hgb 6.6 - pt received PRBC today - recheck CBC pending. Per RN, no bleeding on outside that can be seen, no BM since admission.  4/25 1914 HL 0.1 (@700  units/hr)   Goal of Therapy:  Heparin level 0.3-0.7 units/ml Monitor platelets by anticoagulation protocol: Yes   Plan:  Will bolus 1700 units and increase heparin drip to 900 units/hr Heparin level 6h after change in heparin drip CBC in AM  Pharmacy will continue to follow.   Lu Duffel, PharmD, BCPS Clinical  Pharmacist 10/17/2018 7:53 PM

## 2018-10-17 NOTE — Progress Notes (Signed)
ANTICOAGULATION CONSULT NOTE - Initial Consult  Pharmacy Consult for Heparin Indication: PAD  No Known Allergies  Patient Measurements: Height: 5\' 8"  (172.7 cm) Weight: 126 lb 15.8 oz (57.6 kg) IBW/kg (Calculated) : 68.4 Heparin Dosing Weight: 57.6 kg  Vital Signs: Temp: 98.1 F (36.7 C) (04/25 0856) Temp Source: Oral (04/25 0856) BP: 113/65 (04/25 0856) Pulse Rate: 91 (04/25 0856)  Labs: Recent Labs    10/15/18 0941  10/15/18 1712 10/15/18 2303 10/16/18 0448 10/16/18 1723 10/16/18 2312 10/17/18 0436  HGB  --    < > 7.8* 7.2* 6.9* 6.1* 7.0* 6.6*  HCT  --    < > 24.0* 22.3* 21.6* 18.9* 21.0* 20.4*  PLT  --    < > 256 211 206 201  --  167  HEPARINUNFRC  --    < > 0.34 0.11* 0.12*  --   --   --   CREATININE 1.51*  --   --   --  2.81*  --   --  3.57*   < > = values in this interval not displayed.    Estimated Creatinine Clearance: 17.7 mL/min (A) (by C-G formula based on SCr of 3.57 mg/dL (H)).   Medical History: Past Medical History:  Diagnosis Date  . Hypertension   . Neuromuscular disorder (HCC)    numbness feet  . Peripheral vascular disease ([place])   . Shortness of breath dyspnea      Assessment: 63 yo male due to receive Xarelto starting today with AKI and low Hgb. Discussed with ICU MD.  Hgb 6.6 - pt received PRBC today - recheck CBC pending. Per RN, no bleeding on outside that can be seen, no BM since admission.   Goal of Therapy:  Heparin level 0.3-0.7 units/ml Monitor platelets by anticoagulation protocol: Yes   Plan:  Baseline aPTT and INR ordered - called RN, pt with central line Will start heparin drip at 700 units/hr Heparin level 8h after start of heparin drip CBC in AM  Pharmacy will continue to follow.   Rayna Sexton L 10/17/2018,10:20 AM

## 2018-10-17 NOTE — Progress Notes (Signed)
Bolus and rate change administered by RN and witnessing RN Pt tolerated well, will continue to monitor

## 2018-10-17 NOTE — Progress Notes (Signed)
1 Day Post-Op   Subjective/Chief Complaint: Patient comfortable.  New drop in Hgb and required one unit of PRBCs  Objective: Vital signs in last 24 hours: Temp:  [97.7 F (36.5 C)-99.1 F (37.3 C)] 98.1 F (36.7 C) (04/25 0856) Pulse Rate:  [81-105] 91 (04/25 0856) Resp:  [9-21] 17 (04/25 0856) BP: (104-159)/(59-97) 113/65 (04/25 0856) SpO2:  [89 %-100 %] 98 % (04/25 0856) Last BM Date: 10/14/18  Intake/Output from previous day: 04/24 0701 - 04/25 0700 In: 1768.8 [P.O.:480; I.V.:326.8; Blood:962] Out: 575 [Urine:575] Intake/Output this shift: Total I/O In: 380 [Blood:380] Out: 400 [Urine:400]  General appearance: alert, cooperative, appears older than stated age and no distress. Lungs and heart clear. Abdomen clear Lower limbs right foot warm with 1+ pitting edema, the left foot warm and minimal swelling. The left central line is intact without erythema.  Rest of exam is unremarkable.   Lab Results:  Recent Labs    10/17/18 0436 10/17/18 1046  WBC 7.7 7.8  HGB 6.6* 8.8*  HCT 20.4* 26.1*  PLT 167 175   BMET Recent Labs    10/16/18 0448 10/17/18 0436  NA 139 138  K 4.7 4.5  CL 109 109  CO2 22 21*  GLUCOSE 121* 105*  BUN 25* 34*  CREATININE 2.81* 3.57*  CALCIUM 8.3* 8.0*   PT/INR Recent Labs    10/17/18 1046  LABPROT 14.6  INR 1.2   ABG No results for input(s): PHART, HCO3 in the last 72 hours.  Invalid input(s): PCO2, PO2  Studies/Results: No results found.  Anti-infectives: Anti-infectives (From admission, onward)   Start     Dose/Rate Route Frequency Ordered Stop   10/16/18 0915  ceFAZolin (ANCEF) IVPB 2g/100 mL premix     2 g 200 mL/hr over 30 Minutes Intravenous  Once 10/16/18 0907 10/16/18 1030   10/15/18 1131  ceFAZolin (ANCEF) IVPB 2g/100 mL premix  Status:  Discontinued     2 g 200 mL/hr over 30 Minutes Intravenous 30 min pre-op 10/15/18 1131 10/15/18 1251   10/15/18 1000  ceFAZolin (ANCEF) IVPB 2g/100 mL premix     2 g 200 mL/hr  over 30 Minutes Intravenous  Once 10/15/18 0948 10/15/18 1046      Assessment/Plan: s/p Procedure(s): Lower Extremity Angiography (Left) Continue with current treatment.  Monitor HxH.  PT/OT  LOS: 2 days    Elmore Guise 10/17/2018

## 2018-10-18 LAB — TYPE AND SCREEN
ABO/RH(D): A POS
Antibody Screen: NEGATIVE
Unit division: 0
Unit division: 0
Unit division: 0
Unit division: 0

## 2018-10-18 LAB — BPAM RBC
Blood Product Expiration Date: 202004262359
Blood Product Expiration Date: 202004282359
Blood Product Expiration Date: 202005052359
Blood Product Expiration Date: 202005062359
ISSUE DATE / TIME: 202004241813
ISSUE DATE / TIME: 202004250550
Unit Type and Rh: 5100
Unit Type and Rh: 6200
Unit Type and Rh: 6200
Unit Type and Rh: 6200

## 2018-10-18 LAB — HEPARIN LEVEL (UNFRACTIONATED)
Heparin Unfractionated: 0.24 IU/mL — ABNORMAL LOW (ref 0.30–0.70)
Heparin Unfractionated: 0.3 IU/mL (ref 0.30–0.70)
Heparin Unfractionated: 0.21 IU/mL — ABNORMAL LOW (ref 0.30–0.70)

## 2018-10-18 LAB — CBC
HCT: 24.4 % — ABNORMAL LOW (ref 39.0–52.0)
Hemoglobin: 8.1 g/dL — ABNORMAL LOW (ref 13.0–17.0)
MCH: 29.9 pg (ref 26.0–34.0)
MCHC: 33.2 g/dL (ref 30.0–36.0)
MCV: 90 fL (ref 80.0–100.0)
Platelets: 184 10*3/uL (ref 150–400)
RBC: 2.71 MIL/uL — ABNORMAL LOW (ref 4.22–5.81)
RDW: 14.1 % (ref 11.5–15.5)
WBC: 7.6 10*3/uL (ref 4.0–10.5)
nRBC: 0 % (ref 0.0–0.2)

## 2018-10-18 LAB — HEMOGLOBIN
Hemoglobin: 8.7 g/dL — ABNORMAL LOW (ref 13.0–17.0)
Hemoglobin: 7.6 g/dL — ABNORMAL LOW (ref 13.0–17.0)

## 2018-10-18 LAB — PREPARE RBC (CROSSMATCH)

## 2018-10-18 MED ORDER — POLYETHYLENE GLYCOL 3350 17 G PO PACK
17.0000 g | PACK | Freq: Every day | ORAL | Status: DC
  Administered 2018-10-18 – 2018-10-19 (×2): 17 g via ORAL
  Filled 2018-10-18 (×2): qty 1

## 2018-10-18 NOTE — Progress Notes (Addendum)
ANTICOAGULATION CONSULT NOTE - Initial Consult  Pharmacy Consult for Heparin Indication: PAD  No Known Allergies  Patient Measurements: Height: 5\' 8"  (172.7 cm) Weight: 126 lb 15.8 oz (57.6 kg) IBW/kg (Calculated) : 68.4 Heparin Dosing Weight: 57.6 kg  Vital Signs: Temp: 98.4 F (36.9 C) (04/26 0430) Temp Source: Oral (04/26 0430) BP: 157/92 (04/26 0723) Pulse Rate: 95 (04/26 0723)  Labs: Recent Labs    10/16/18 0448  10/17/18 0436 10/17/18 1046 10/17/18 1914 10/18/18 0206 10/18/18 1130  HGB 6.9*   < > 6.6* 8.8*  --  8.1*  --   HCT 21.6*   < > 20.4* 26.1*  --  24.4*  --   PLT 206   < > 167 175  --  184  --   APTT  --   --   --  36  --   --   --   LABPROT  --   --   --  14.6  --   --   --   INR  --   --   --  1.2  --   --   --   HEPARINUNFRC 0.12*  --   --   --  0.10* 0.24* 0.30  CREATININE 2.81*  --  3.57*  --   --   --   --    < > = values in this interval not displayed.    Estimated Creatinine Clearance: 17.7 mL/min (A) (by C-G formula based on SCr of 3.57 mg/dL (H)).   Assessment: 63 yo male due to receive Xarelto starting 4/25 with AKI and low Hgb.  Hgb 6.6 - pt received PRBC 4/25 - recheck Hgb 8.8. Per RN, no bleeding on outside that can be seen, no BM since admission.  04/26 @ 0200 HL 0.24 subtherapeutic. Will increase rate to 1050 units/hr  Goal of Therapy:  Heparin level 0.3-0.7 units/ml - Per vascular MD ok to do normal heparin dosing with normal bolus and normal goals as pt had intervention on Friday Monitor platelets by anticoagulation protocol: Yes   Plan:  04/26 @ 1130 HL 0.30 therapeutic x1. Per RN no s/sx of bleeding. As Hgb is trending down, will continue at current rate and recheck HL at 1700 (with hgb ordered by MD). CBC in AM.   Discussed Hgb trend 8.8 yesterday to 8.1 today with vascular MD.  MD stated he will order an H&H ~1700 - may need to stop heparin drip if Hgb drops below 8.0 per MD (will need to discuss with MD).  Pharmacy will  continue to follow.   Rocky Morel, PharmD, BCPS Clinical Pharmacist 10/18/2018 12:11 PM   Addendum 4/26 @ 1751 - Heparin dc'ed per Dr Feliberto Gottron - HGB continued to drop to 7.6 - will assess for restart tomorrow am  Lu Duffel, PharmD, BCPS Clinical Pharmacist 10/18/2018 5:53 PM

## 2018-10-18 NOTE — Progress Notes (Signed)
Physical Therapy Evaluation Patient Details Name: [name] MRN: [id number] DOB: Oct 22, 1955 Today's Date: 10/18/2018   History of Present Illness  Pt admitted for RLE pain. He underwent RLE angiogram with extensive revascularization efforts. PMH includes HTN, PVD, and R foot numbness.   Clinical Impression  Pt is independent for bed mobility and transfers. He only requires supervision for ambulation due to assist with IV line. He is able to complete a full lap around RN station with therapist. He denies any increase in RLE pain during ambulation and no DOE reported. No evidence of imbalance during ambulation or during session. Pt is at baseline and is safe to return home when medically indicated. No DME needs.     Follow Up Recommendations No PT follow up    Equipment Recommendations  None recommended by PT    Recommendations for Other Services       Precautions / Restrictions Precautions Precautions: None Restrictions Weight Bearing Restrictions: No      Mobility  Bed Mobility Overal bed mobility: Independent             General bed mobility comments: Good speed/sequencing  Transfers Overall transfer level: Independent Equipment used: None             General transfer comment: Good speed/sequencing  Ambulation/Gait Ambulation/Gait assistance: Supervision Gait Distance (Feet): 200 Feet Assistive device: None       General Gait Details: Pt is able to complete a full lap around RN station with therapist. He denies any increase in RLE pain. No DOE reported. No evidence of imbalance during ambulation  Stairs            Wheelchair Mobility    Modified Rankin (Stroke Patients Only)       Balance Overall balance assessment: No apparent balance deficits (not formally assessed)                                           Pertinent Vitals/Pain Pain Assessment: No/denies pain    Home Living Family/patient expects to be  discharged to:: Private residence Living Arrangements: Children(Daughter) Available Help at Discharge: Family;Other (Comment)(Daughter works during the day) Type of Home: House Home Access: Stairs to enter Entrance Stairs-Rails: Horticulturist, commercial of Steps: 3 Home Layout: Two level;Bed/bath upstairs Home Equipment: None      Prior Function Level of Independence: Independent         Comments: Independent with ADLs/IADLs. 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   Dominant Hand: Right    Extremity/Trunk Assessment   Upper Extremity Assessment Upper Extremity Assessment: Overall WFL for tasks assessed    Lower Extremity Assessment Lower Extremity Assessment: Overall WFL for tasks assessed       Communication   Communication: No difficulties  Cognition Arousal/Alertness: Awake/alert Behavior During Therapy: WFL for tasks assessed/performed Overall Cognitive Status: Within Functional Limits for tasks assessed                                        General Comments      Exercises     Assessment/Plan    PT Assessment Patent does not need any further PT services  PT Problem List         PT Treatment Interventions  PT Goals (Current goals can be found in the Care Plan section)       Frequency     Barriers to discharge        Co-evaluation               AM-PAC PT "6 Clicks" Mobility  Outcome Measure Help needed turning from your back to your side while in a flat bed without using bedrails?: None Help needed moving from lying on your back to sitting on the side of a flat bed without using bedrails?: None Help needed moving to and from a bed to a chair (including a wheelchair)?: None Help needed standing up from a chair using your arms (e.g., wheelchair or bedside chair)?: None Help needed to walk in hospital room?: None Help needed climbing 3-5 steps with a railing? : None 6 Click Score: 24    End  of Session Equipment Utilized During Treatment: Gait belt Activity Tolerance: Patient tolerated treatment well Patient left: in chair;with call bell/phone within reach Nurse Communication: Mobility status PT Visit Diagnosis: Unsteadiness on feet (R26.81)    Time: 0092-3300 PT Time Calculation (min) (ACUTE ONLY): 18 min   Charges:   PT Evaluation $PT Eval Low Complexity: 1 Low           D  PT, DPT, GCS   , 10/18/2018, 11:55 AM

## 2018-10-18 NOTE — Progress Notes (Addendum)
ANTICOAGULATION CONSULT NOTE - Initial Consult  Pharmacy Consult for Heparin Indication: PAD  No Known Allergies  Patient Measurements: Height: 5\' 8"  (172.7 cm) Weight: 126 lb 15.8 oz (57.6 kg) IBW/kg (Calculated) : 68.4 Heparin Dosing Weight: 57.6 kg  Vital Signs: Temp: 98.8 F (37.1 C) (04/25 1922) Temp Source: Oral (04/25 1922) BP: 166/79 (04/25 1922) Pulse Rate: 98 (04/25 1922)  Labs: Recent Labs    10/15/18 0941  10/16/18 0448  10/17/18 0436 10/17/18 1046 10/17/18 1914 10/18/18 0206  HGB  --    < > 6.9*   < > 6.6* 8.8*  --  8.1*  HCT  --    < > 21.6*   < > 20.4* 26.1*  --  24.4*  PLT  --    < > 206   < > 167 175  --  184  APTT  --   --   --   --   --  36  --   --   LABPROT  --   --   --   --   --  14.6  --   --   INR  --   --   --   --   --  1.2  --   --   HEPARINUNFRC  --    < > 0.12*  --   --   --  0.10* 0.24*  CREATININE 1.51*  --  2.81*  --  3.57*  --   --   --    < > = values in this interval not displayed.    Estimated Creatinine Clearance: 17.7 mL/min (A) (by C-G formula based on SCr of 3.57 mg/dL (H)).   Medical History: Past Medical History:  Diagnosis Date  . Hypertension   . Neuromuscular disorder (HCC)    numbness feet  . Peripheral vascular disease ([place])   . Shortness of breath dyspnea      Assessment: 63 yo male due to receive Xarelto starting today with AKI and low Hgb. Discussed with ICU MD.  Hgb 6.6 - pt received PRBC today - recheck CBC pending. Per RN, no bleeding on outside that can be seen, no BM since admission.  4/25 1914 HL 0.1 (@700  units/hr)   Goal of Therapy:  Heparin level 0.3-0.7 units/ml Monitor platelets by anticoagulation protocol: Yes   Plan:  04/26 @ 0200 HL 0.24 subtherapeutic. Will increase rate to 1050 units/hr w/o bolus and will recheck @ 0900. tPA currently stopped, hgb low stable post transfusion, will continue to monitor.   Pharmacy will continue to follow.   Tobie Lords, PharmD, BCPS Clinical  Pharmacist 10/18/2018 3:23 AM

## 2018-10-18 NOTE — Progress Notes (Signed)
2 Days Post-Op   Subjective/Chief Complaint: Patient comfortable and getting up.  He continues to have left groin central line.    Objective: Vital signs in last 24 hours: Temp:  [98.4 F (36.9 C)-98.8 F (37.1 C)] 98.4 F (36.9 C) (04/26 0430) Pulse Rate:  [86-98] 95 (04/26 0723) Resp:  [17-20] 17 (04/26 0430) BP: (157-197)/(79-96) 157/92 (04/26 0723) SpO2:  [98 %-100 %] 98 % (04/26 0430) Last BM Date: 10/14/18  Intake/Output from previous day: 04/25 0701 - 04/26 0700 In: 2102 [I.V.:1722; Blood:380] Out: 2825 [Urine:2825] Intake/Output this shift: Total I/O In: 360 [P.O.:360] Out: 450 [Urine:450]  General appearance: alert, cooperative and appears older than stated age Head: Normocephalic, without obvious abnormality, atraumatic Eyes: conjunctivae/corneas clear. PERRL, EOM's intact. Fundi benign. Throat: lips, mucosa, and tongue normal; teeth and gums normal Neck: no adenopathy, no carotid bruit, no JVD, supple, symmetrical, trachea midline and thyroid not enlarged, symmetric, no tenderness/mass/nodules Back: symmetric, no curvature. ROM normal. No CVA tenderness. Resp: clear to auscultation bilaterally Chest wall: no tenderness Extremities: extremities normal, atraumatic, no cyanosis or edema and edema right foot 1+ pitting edema Pulses: 2+ and symmetric Skin: Skin color, texture, turgor normal. No rashes or lesions Incision/Wound:  Lab Results:  Recent Labs    10/17/18 1046 10/18/18 0206  WBC 7.8 7.6  HGB 8.8* 8.1*  HCT 26.1* 24.4*  PLT 175 184   BMET Recent Labs    10/16/18 0448 10/17/18 0436  NA 139 138  K 4.7 4.5  CL 109 109  CO2 22 21*  GLUCOSE 121* 105*  BUN 25* 34*  CREATININE 2.81* 3.57*  CALCIUM 8.3* 8.0*   PT/INR Recent Labs    10/17/18 1046  LABPROT 14.6  INR 1.2   ABG No results for input(s): PHART, HCO3 in the last 72 hours.  Invalid input(s): PCO2, PO2  Studies/Results: No results found.  Anti-infectives: Anti-infectives  (From admission, onward)   Start     Dose/Rate Route Frequency Ordered Stop   10/16/18 0915  ceFAZolin (ANCEF) IVPB 2g/100 mL premix     2 g 200 mL/hr over 30 Minutes Intravenous  Once 10/16/18 0907 10/16/18 1030   10/15/18 1131  ceFAZolin (ANCEF) IVPB 2g/100 mL premix  Status:  Discontinued     2 g 200 mL/hr over 30 Minutes Intravenous 30 min pre-op 10/15/18 1131 10/15/18 1251   10/15/18 1000  ceFAZolin (ANCEF) IVPB 2g/100 mL premix     2 g 200 mL/hr over 30 Minutes Intravenous  Once 10/15/18 0948 10/15/18 1046      Assessment/Plan: s/p Procedure(s): Lower Extremity Angiography (Left) Repeat Hg q12 hrs  Remove left   LOS: 3 days    Elmore Guise 10/18/2018

## 2018-10-19 ENCOUNTER — Encounter: Payer: Self-pay | Admitting: Vascular Surgery

## 2018-10-19 DIAGNOSIS — Z9889 Other specified postprocedural states: Secondary | ICD-10-CM

## 2018-10-19 DIAGNOSIS — Z9862 Peripheral vascular angioplasty status: Secondary | ICD-10-CM

## 2018-10-19 LAB — CBC
HCT: 24.1 % — ABNORMAL LOW (ref 39.0–52.0)
Hemoglobin: 7.9 g/dL — ABNORMAL LOW (ref 13.0–17.0)
MCH: 29.3 pg (ref 26.0–34.0)
MCHC: 32.8 g/dL (ref 30.0–36.0)
MCV: 89.3 fL (ref 80.0–100.0)
Platelets: 221 10*3/uL (ref 150–400)
RBC: 2.7 MIL/uL — ABNORMAL LOW (ref 4.22–5.81)
RDW: 14.3 % (ref 11.5–15.5)
WBC: 5.6 10*3/uL (ref 4.0–10.5)
nRBC: 0 % (ref 0.0–0.2)

## 2018-10-19 LAB — BASIC METABOLIC PANEL
Anion gap: 7 (ref 5–15)
BUN: 27 mg/dL — ABNORMAL HIGH (ref 8–23)
CO2: 20 mmol/L — ABNORMAL LOW (ref 22–32)
Calcium: 8.3 mg/dL — ABNORMAL LOW (ref 8.9–10.3)
Chloride: 111 mmol/L (ref 98–111)
Creatinine, Ser: 2.36 mg/dL — ABNORMAL HIGH (ref 0.61–1.24)
GFR calc Af Amer: 33 mL/min — ABNORMAL LOW (ref >60)
GFR calc non Af Amer: 29 mL/min — ABNORMAL LOW (ref >60)
Glucose, Bld: 91 mg/dL (ref 70–99)
Potassium: 4.6 mmol/L (ref 3.5–5.1)
Sodium: 138 mmol/L (ref 135–145)

## 2018-10-19 MED ORDER — APIXABAN 5 MG PO TABS: 5.0000 mg | ORAL_TABLET | Freq: Two times a day (BID) | ORAL | 3 refills | Status: DC

## 2018-10-19 MED ORDER — APIXABAN 5 MG PO TABS
5.0000 mg | ORAL_TABLET | Freq: Two times a day (BID) | ORAL | Status: DC
  Administered 2018-10-19: 5 mg via ORAL
  Filled 2018-10-19: qty 1

## 2018-10-19 MED ORDER — HYDROCODONE-ACETAMINOPHEN 5-325 MG PO TABS: 1.0000 | ORAL_TABLET | Freq: Four times a day (QID) | ORAL | 0 refills | Status: DC | PRN

## 2018-10-19 NOTE — Discharge Instructions (Signed)
You may shower. Keep groins clean and dry. No driving while on pain medication. Your prescriptions were e-scribed to your pharmacy on file.   You may return to work on 10/21/18. No restrictions.

## 2018-10-19 NOTE — Plan of Care (Signed)
  Problem: Education: Goal: Understanding of CV disease, CV risk reduction, and recovery process will improve Outcome: Adequate for Discharge   Problem: Activity: Goal: Ability to return to baseline activity level will improve Outcome: Adequate for Discharge   Problem: Cardiovascular: Goal: Ability to achieve and maintain adequate cardiovascular perfusion will improve Outcome: Adequate for Discharge Goal: Vascular access site(s) Level 0-1 will be maintained Outcome: Adequate for Discharge   Problem: Health Behavior/Discharge Planning: Goal: Ability to safely manage health-related needs after discharge will improve Outcome: Adequate for Discharge

## 2018-10-19 NOTE — Progress Notes (Signed)
Patient given discharge instructions with IV out. Patient verbalized understanding. Patient asked change in dates for the work note. He stated Dr. Lucky Cowboy was in agreement with the change. PA Stegmayer stated pt will have to get in office. Patient verbalized understanding. Patient going home in stable condition.

## 2018-10-19 NOTE — Discharge Summary (Signed)
[facility]    Discharge Summary  Patient ID:  Ryan [name] MRN: [id number] DOB/AGE: 1955-12-10 63 y.o.  Admit date: 10/15/2018 Discharge date: 10/19/2018 Date of Surgery: 10/16/2018 Surgeon: Surgeon(s): Algernon Huxley, MD  Admission Diagnosis: Ischemic leg [I99.8]  Discharge Diagnoses:  Ischemic leg [I99.8]  Secondary Diagnoses: Past Medical History:  Diagnosis Date  . Hypertension   . Neuromuscular disorder (HCC)    numbness feet  . Peripheral vascular disease ([place])   . Shortness of breath dyspnea    Procedure(s): 10/15/18: 1. Ultrasound guidance for vascular access left femoral artery 2. Catheter placement into right common femoral artery from left femoral approach 3. Selective right lower extremity angiogram 4.  Catheter directed thrombolytic therapy with 8 mg of TPA instilled into the right SFA, popliteal artery, tibioperoneal trunk, and posterior tibial arteries 5.  Mechanical thrombectomy with the penumbra cat 6 device to the right SFA, popliteal artery, tibioperoneal trunk, and posterior tibial artery             6.  Placement of a continuous infusion catheter for overnight thrombolytic therapy using a 135 cm total length 50 cm working length catheter in the right SFA, popliteal artery, tibioperoneal trunk, and proximal posterior tibial arteries 7.  Placement of the left femoral venous triple-lumen catheter with ultrasound and fluoroscopic guidance  10/16/18: 1. RLE angiogram 2. Catheter placement into right PT and AT arteries from left femoral approach 3. Mechanical thrombectomy with the Penumbra cat 6 device to the right common femoral artery , proximal SFA, and right anterior tibial artery 4. Percutaneous transluminal angioplasty of anterior tibial artery with a 2.5 mm diameter by 30 cm length angioplasty  balloon 5. StarClose closure device left femoral artery  Discharged Condition: good  HPI:  The patient is a 63 year old male with multiple medical issues well-known to our service including severe peripheral artery disease of the lower extremities.  The patient presented with lower extremity rest pain and found to be in acute on chronic ischemia.  On 10/15/18, the patient underwent a right lower extremity angiogram with intervention.  A catheter was placed for lysis overnight.  The patient was taken back to the angiogram suite the following morning and underwent further endovascular intervention to the right lower extremity.  The patient tolerated both procedures without complication.  Night of surgery for both procedures was unremarkable.  Postop day #2, the patient was transfused 1 unit packed red blood cells for asymptomatic anemia.  Upon discharge on postop day #3, the patient was afebrile with stable vital signs, hemoglobin was stable, he was tolerating regular diet, his Foley was removed and he was urinating without complication, his pain was controlled to the use of p.o. pain medication and he was ambulating independently.  Physical exam:  A&Ox3, NAD CV: RRR Pulm: CTA Bilaterally Abdomen: Soft, Non-tender, Non-distended, (+) Bowel Sounds Left Groin: Procedure dressing removed, access site, clean, dry and intact Right lower extremity: Thigh warm, calf warm. Extremity warm distally to toes. Hard to palpate pedal pulses however foot is warm. Motor / sensory is intact.  Post-op wounds clean, dry, intact or healing well  Labs as below  Complications: None  Consults: None  Significant Diagnostic Studies: CBC Lab Results  Component Value Date   WBC 5.6 10/19/2018   HGB 7.9 (L) 10/19/2018   HCT 24.1 (L) 10/19/2018   MCV 89.3 10/19/2018   PLT 221 10/19/2018   BMET    Component Value Date/Time   NA 138 10/19/2018 0440  NA 137 08/31/2015 0929   K 4.6 10/19/2018 0440    CL 111 10/19/2018 0440   CO2 20 (L) 10/19/2018 0440   GLUCOSE 91 10/19/2018 0440   BUN 27 (H) 10/19/2018 0440   BUN 11 08/31/2015 0929   CREATININE 2.36 (H) 10/19/2018 0440   CREATININE 1.95 (H) 07/21/2018 1501   CALCIUM 8.3 (L) 10/19/2018 0440   GFRNONAA 29 (L) 10/19/2018 0440   GFRNONAA 36 (L) 07/21/2018 1501   GFRAA 33 (L) 10/19/2018 0440   GFRAA 42 (L) 07/21/2018 1501   COAG Lab Results  Component Value Date   INR 1.2 10/17/2018   Disposition:  Discharge to :Home  Allergies as of 10/19/2018   No Known Allergies     Medication List    TAKE these medications   apixaban 5 MG Tabs tablet Commonly known as:  ELIQUIS Take 1 tablet (5 mg total) by mouth 2 (two) times daily.   aspirin EC 81 MG tablet Take 1 tablet (81 mg total) by mouth daily.   atorvastatin 20 MG tablet Commonly known as:  LIPITOR Take 1 tablet (20 mg total) by mouth daily.   Fluticasone-Umeclidin-Vilant 100-62.5-25 MCG/INH Aepb Commonly known as:  Trelegy Ellipta Inhale 1 puff into the lungs daily. In place of Anoro   HYDROcodone-acetaminophen 5-325 MG tablet Commonly known as:  Norco Take 1-2 tablets by mouth every 6 (six) hours as needed for moderate pain or severe pain.   lisinopril 10 MG tablet Commonly known as:  ZESTRIL Take 1 tablet (10 mg total) by mouth daily. What changed:  how much to take   nicotine 21 mg/24hr patch Commonly known as:  NICODERM CQ - dosed in mg/24 hours Place 1 patch (21 mg total) onto the skin daily.   Vitamin B-12 1000 MCG Subl Place 1 tablet (1,000 mcg total) under the tongue daily.      Medications were e-scribed to the patients pharmacy on file.  Verbal and written Discharge instructions given to the patient. Wound care per Discharge AVS  Follow-up Information    Dew, Erskine Squibb, MD Follow up in 2 week(s).   Specialties:  Vascular Surgery, Radiology, Interventional Cardiology Why:  Can see Dew or midlevel. Will need ABI. Contact information: [place] [address] [phone]          Signed: Sela Hua, PA-C  10/19/2018, 9:39 AM

## 2018-10-19 NOTE — Progress Notes (Signed)
Pt given PRN dose of hydralazine for BP 163/99. BP decreased to 158/82. Pt appears asymptomatic. Continued pain in Right foot with swelling. Will continue to monitor.

## 2018-10-19 NOTE — Progress Notes (Signed)
   10/19/18 1400  Clinical Encounter Type  Visited With Patient  Visit Type Initial  Referral From Nurse  Spiritual Encounters  Spiritual Needs Brochure   Chaplain received an OR to complete or update an AD. Upon arrival to the patient's room, the patient was watching television and preparing to be discharged. He confirmed interest in the AD and would like his daughter to serve as HPOA. Patient educated. Daughter called during conversation and she and the patient would like for me to give her a call to discuss. Will follow up with her via phone this afternoon. Patient expressed gratitude for the visit.

## 2018-10-20 ENCOUNTER — Encounter (INDEPENDENT_AMBULATORY_CARE_PROVIDER_SITE_OTHER): Payer: Self-pay | Admitting: Vascular Surgery

## 2018-10-20 NOTE — Progress Notes (Signed)
   10/19/18 2000  Clinical Encounter Type  Visited With Family  Visit Type Follow-up  Referral From Patient   Chaplain followed up with the patient's daughter per his earlier request to provide information on completing the AD documents. The patient's daughter asked questions regarding completion (where, how, and methods to submit information once complete). Chaplain will follow up as soon as possible with additional information on if notarized documents can be uploaded electronically somehow.

## 2018-10-21 ENCOUNTER — Ambulatory Visit (INDEPENDENT_AMBULATORY_CARE_PROVIDER_SITE_OTHER): Payer: BLUE CROSS/BLUE SHIELD | Admitting: Family Medicine

## 2018-10-21 ENCOUNTER — Other Ambulatory Visit: Payer: Self-pay

## 2018-10-21 ENCOUNTER — Encounter: Payer: Self-pay | Admitting: Family Medicine

## 2018-10-21 DIAGNOSIS — I739 Peripheral vascular disease, unspecified: Secondary | ICD-10-CM

## 2018-10-21 DIAGNOSIS — F1020 Alcohol dependence, uncomplicated: Secondary | ICD-10-CM

## 2018-10-21 DIAGNOSIS — N183 Chronic kidney disease, stage 3 unspecified: Secondary | ICD-10-CM

## 2018-10-21 DIAGNOSIS — I129 Hypertensive chronic kidney disease with stage 1 through stage 4 chronic kidney disease, or unspecified chronic kidney disease: Secondary | ICD-10-CM

## 2018-10-21 DIAGNOSIS — I10 Essential (primary) hypertension: Secondary | ICD-10-CM

## 2018-10-21 DIAGNOSIS — IMO0001 Reserved for inherently not codable concepts without codable children: Secondary | ICD-10-CM

## 2018-10-21 DIAGNOSIS — J449 Chronic obstructive pulmonary disease, unspecified: Secondary | ICD-10-CM | POA: Diagnosis not present

## 2018-10-21 DIAGNOSIS — E519 Thiamine deficiency, unspecified: Secondary | ICD-10-CM

## 2018-10-21 DIAGNOSIS — I700 Atherosclerosis of aorta: Secondary | ICD-10-CM

## 2018-10-21 DIAGNOSIS — E538 Deficiency of other specified B group vitamins: Secondary | ICD-10-CM

## 2018-10-21 MED ORDER — VITAMIN B-1 50 MG PO TABS: 50.0000 mg | ORAL_TABLET | Freq: Every day | ORAL | 2 refills | Status: DC

## 2018-10-21 MED ORDER — LISINOPRIL 10 MG PO TABS: 20.0000 mg | ORAL_TABLET | Freq: Every day | ORAL | 1 refills | Status: DC

## 2018-10-21 MED ORDER — FLUTICASONE-UMECLIDIN-VILANT 100-62.5-25 MCG/INH IN AEPB: 1.0000 | INHALATION_SPRAY | Freq: Every day | RESPIRATORY_TRACT | 0 refills | Status: DC

## 2018-10-21 NOTE — Progress Notes (Signed)
Name: Ryan [name]   MRN: [id number]    DOB: Jul 19, 1955   Date:10/21/2018       Progress Note  Subjective  Chief Complaint  Chief Complaint  Patient presents with   Hypertension   PAD   COPD   Referral    He wants to know is there another vein and vascular office he can go to.    I connected with  [name] on 10/21/18 at  3:00 PM EDT by telephone and verified that I am speaking with the correct person using two identifiers.  I discussed the limitations, risks, security and privacy concerns of performing an evaluation and management service by telephone and the availability of in person appointments. Staff also discussed with the patient that there may be a patient responsible charge related to this service. Patient Location: at home  Provider Location: [facility]   HPI  Paresthesia: with low B1 and B12 diagnosed within the past month and had one B12 injection, still not taking B1. States he had one B12 injection and is now taking otc SL B12, he has not started B1 yet, he states he would like a rx for the B1. He states tingling is slightly better but still present, taking gabapentin also   Alcoholism: states used to drink 6-7 beers per day, currently around 3-4 , not ready to quit, but cutting down. Explained importance of trying to join [place] group but he states he does not need it   PVD: s/p intervention of right lower extremity done by Dr. Lucky Cowboy on 07/02/2017 and again on 09/2018 secondary to ischemic pain on right foot . Since procedure only pain from procedure but not the pain that he had before.   Anemia: he had to get blood transfusion during his last hospital stay, we will need to recheck labs next and also hemoccult stools cards  COPD: he is on Trelegy , he has occasional SOB and wheezing, he states he does not cough. Spirometry last visit  showed Fev1/FVC at 80. Continue medication  Malnutrition: likely multifactorial. He has lost 5 % of  his weight in the past 6 months, but he has gained weight since last visit.  He has stopped smoking.He is back to his  baseline weight around 130 lbs. He states since he stopped smoking he has a good appetite,he states weight at the hospital was 128 lbs, but he was 130 lbs back in January   Atherosclerosis aorta/PAD: atherosclerosis found on CT. He is on Eliquis, Atorvastatin and aspirin. He was also admitted with ischemic leg pain, and had  Mechanical thrombectomy and precutaneious transluminal angioplasty of anterior tibial artery. He was discharged on 10/19/2018 and is doing well.   Acute on chronic kidney disease: reviewed labs done during hospital visits month of April, last GFR showed mild improvement up to 29. We will need to recheck labs in about one month. Avoid nsaids and stay hydrated   Patient Active Problem List   Diagnosis Date Noted   Atherosclerosis of native arteries of extremity with rest pain ([place]) 10/13/2018   PAD (peripheral artery disease) ([place]) 07/21/2018   Ischemic leg 06/25/2018   Claudication ([place]) 03/17/2018   B12 deficiency 03/12/2018   Vitamin B1 deficiency 03/12/2018   Varicose veins of leg with pain, right 03/12/2018   Enlarged thoracic aorta ([place]) 02/24/2018   Alcoholism /alcohol abuse ([place]) 02/24/2018   Paresthesia 02/24/2018   Ectatic aorta ([place]) 02/20/2018   Emphysema lung ([place]) 02/20/2018   Atherosclerosis  of aorta ([place]) 02/20/2018   Encounter for tobacco use cessation counseling    Benign neoplasm of cecum    Benign neoplasm of transverse colon    Benign neoplasm of sigmoid colon    Hypertension, benign 08/31/2015   Tobacco use 08/31/2015   Abnormal CXR 08/31/2015    Past Surgical History:  Procedure Laterality Date   COLONOSCOPY WITH PROPOFOL N/A 02/15/2016   Procedure: COLONOSCOPY WITH PROPOFOL;  Surgeon: Lucilla Lame, MD;  Location: [facility];  Service: Endoscopy;  Laterality: N/A;   HERNIA REPAIR  1999   left  inguinal   LOWER EXTREMITY ANGIOGRAPHY Right 05/07/2018   Procedure: LOWER EXTREMITY ANGIOGRAPHY;  Surgeon: Algernon Huxley, MD;  Location: [facility];  Service: Cardiovascular;  Laterality: Right;   LOWER EXTREMITY ANGIOGRAPHY Right 06/25/2018   Procedure: LOWER EXTREMITY ANGIOGRAPHY;  Surgeon: Algernon Huxley, MD;  Location: [facility];  Service: Cardiovascular;  Laterality: Right;   LOWER EXTREMITY ANGIOGRAPHY Right 07/01/2018   Procedure: LOWER EXTREMITY ANGIOGRAPHY;  Surgeon: Algernon Huxley, MD;  Location: [facility];  Service: Cardiovascular;  Laterality: Right;   LOWER EXTREMITY ANGIOGRAPHY Right 08/12/2018   Procedure: LOWER EXTREMITY ANGIOGRAPHY;  Surgeon: Algernon Huxley, MD;  Location: [facility];  Service: Cardiovascular;  Laterality: Right;   LOWER EXTREMITY ANGIOGRAPHY Right 09/03/2018   Procedure: LOWER EXTREMITY ANGIOGRAPHY;  Surgeon: Algernon Huxley, MD;  Location: [facility];  Service: Cardiovascular;  Laterality: Right;   LOWER EXTREMITY ANGIOGRAPHY Right 09/04/2018   Procedure: Lower Extremity Angiography;  Surgeon: Algernon Huxley, MD;  Location: [facility];  Service: Cardiovascular;  Laterality: Right;   LOWER EXTREMITY ANGIOGRAPHY Right 10/01/2018   Procedure: LOWER EXTREMITY ANGIOGRAPHY;  Surgeon: Algernon Huxley, MD;  Location: [facility];  Service: Cardiovascular;  Laterality: Right;   LOWER EXTREMITY ANGIOGRAPHY Right 10/01/2018   Procedure: Lower Extremity Angiography;  Surgeon: Algernon Huxley, MD;  Location: [facility];  Service: Cardiovascular;  Laterality: Right;   LOWER EXTREMITY ANGIOGRAPHY Right 10/15/2018   Procedure: LOWER EXTREMITY ANGIOGRAPHY;  Surgeon: Algernon Huxley, MD;  Location: [facility];  Service: Cardiovascular;  Laterality: Right;   LOWER EXTREMITY ANGIOGRAPHY Left 10/16/2018   Procedure: Lower Extremity Angiography;  Surgeon: Algernon Huxley, MD;  Location: [facility];  Service:  Cardiovascular;  Laterality: Left;   LOWER EXTREMITY INTERVENTION Right 07/02/2018   Procedure: LOWER EXTREMITY INTERVENTION;  Surgeon: Algernon Huxley, MD;  Location: [facility];  Service: Cardiovascular;  Laterality: Right;   POLYPECTOMY N/A 02/15/2016   Procedure: POLYPECTOMY;  Surgeon: Lucilla Lame, MD;  Location: [facility];  Service: Endoscopy;  Laterality: N/A;    Family History  Problem Relation Age of Onset   COPD Mother    Hypertension Father    Heart attack Brother     Social History   Socioeconomic History   Marital status: Legally Separated    Spouse name: [name]   Number of children: 5   Years of education: Not on file   Highest education level: High school graduate  Occupational History   Occupation: [employer]    Comment: Editor, commissioning  Social Needs   Financial resource strain: Not hard at all   Food insecurity:    Worry: Never true    Inability: Never true   Transportation needs:    Medical: No    Non-medical: No  Tobacco Use   Smoking status:  Former Smoker    Packs/day: 0.10    Years: 40.00    Pack years: 4.00    Types: Cigarettes    Start date: 07/21/1978    Last attempt to quit: 08/28/2018    Years since quitting: 0.1   Smokeless tobacco: Never Used  Substance and Sexual Activity   Alcohol use: Yes    Alcohol/week: 12.0 standard drinks    Types: 12 Cans of beer per week   Drug use: Never   Sexual activity: Yes    Partners: Female    Birth control/protection: None  Lifestyle   Physical activity:    Days per week: 6 days    Minutes per session: 60 min   Stress: Not at all  Relationships   Social connections:    Talks on phone: Three times a week    Gets together: Twice a week    Attends religious service: Never    Active member of club or organization: No    Attends meetings of clubs or organizations: Never    Relationship status: Married   Intimate partner violence:    Fear of current or ex partner: No     Emotionally abused: No    Physically abused: No    Forced sexual activity: No  Other Topics Concern   Not on file  Social History Narrative   Not on file     Current Outpatient Medications:    apixaban (ELIQUIS) 5 MG TABS tablet, Take 1 tablet (5 mg total) by mouth 2 (two) times daily., Disp: 180 tablet, Rfl: 3   aspirin EC 81 MG tablet, Take 1 tablet (81 mg total) by mouth daily., Disp: 90 tablet, Rfl: 3   atorvastatin (LIPITOR) 20 MG tablet, Take 1 tablet (20 mg total) by mouth daily., Disp: 90 tablet, Rfl: 3   Cyanocobalamin (VITAMIN B-12) 1000 MCG SUBL, Place 1 tablet (1,000 mcg total) under the tongue daily., Disp: 90 tablet, Rfl: 0   Fluticasone-Umeclidin-Vilant (TRELEGY ELLIPTA) 100-62.5-25 MCG/INH AEPB, Inhale 1 puff into the lungs daily. In place of Anoro, Disp: 180 each, Rfl: 0   HYDROcodone-acetaminophen (NORCO) 5-325 MG tablet, Take 1-2 tablets by mouth every 6 (six) hours as needed for moderate pain or severe pain., Disp: 28 tablet, Rfl: 0   lisinopril (PRINIVIL,ZESTRIL) 10 MG tablet, Take 1 tablet (10 mg total) by mouth daily. (Patient taking differently: Take 20 mg by mouth daily. ), Disp: 90 tablet, Rfl: 0   nicotine (NICODERM CQ - DOSED IN MG/24 HOURS) 21 mg/24hr patch, Place 1 patch (21 mg total) onto the skin daily., Disp: 28 patch, Rfl: 1  No Known Allergies  I personally reviewed active problem list, medication list, allergies, family history, social history with the patient/caregiver today.   ROS  Ten systems reviewed and is negative except as mentioned in HPI   Objective  Virtual encounter, vitals not obtained.   Physical Exam  Awake, alert and oriented  Results for orders placed or performed during the hospital encounter of 10/15/18 (from the past 72 hour(s))  Hemoglobin     Status: Abnormal   Collection Time: 10/18/18  5:19 PM  Result Value Ref Range   Hemoglobin 7.6 (L) 13.0 - 17.0 g/dL    Comment: Performed at [facility], [address] [place], [place] [address]  Heparin level (unfractionated)     Status: Abnormal   Collection Time: 10/18/18  5:19 PM  Result Value Ref Range   Heparin Unfractionated 0.21 (L) 0.30 - 0.70 IU/mL  Comment: (NOTE) If heparin results are below expected values, and patient dosage has  been confirmed, suggest follow up testing of antithrombin III levels. Performed at [facility], [address] [place], [place] [address]   CBC     Status: Abnormal   Collection Time: 10/19/18  4:40 AM  Result Value Ref Range   WBC 5.6 4.0 - 10.5 K/uL   RBC 2.70 (L) 4.22 - 5.81 MIL/uL   Hemoglobin 7.9 (L) 13.0 - 17.0 g/dL   HCT 24.1 (L) 39.0 - 52.0 %   MCV 89.3 80.0 - 100.0 fL   MCH 29.3 26.0 - 34.0 pg   MCHC 32.8 30.0 - 36.0 g/dL   RDW 14.3 11.5 - 15.5 %   Platelets 221 150 - 400 K/uL   nRBC 0.0 0.0 - 0.2 %    Comment: Performed at [facility], [address] [place], [place] [address]  Basic metabolic panel     Status: Abnormal   Collection Time: 10/19/18  4:40 AM  Result Value Ref Range   Sodium 138 135 - 145 mmol/L   Potassium 4.6 3.5 - 5.1 mmol/L   Chloride 111 98 - 111 mmol/L   CO2 20 (L) 22 - 32 mmol/L   Glucose, Bld 91 70 - 99 mg/dL   BUN 27 (H) 8 - 23 mg/dL   Creatinine, Ser 2.36 (H) 0.61 - 1.24 mg/dL   Calcium 8.3 (L) 8.9 - 10.3 mg/dL   GFR calc non Af Amer 29 (L) >60 mL/min   GFR calc Af Amer 33 (L) >60 mL/min   Anion gap 7 5 - 15    Comment: Performed at [facility], [address] [place],  [address]    PHQ2/9: Depression screen [facility] 2/9 10/21/2018 06/22/2018 06/08/2018 03/12/2018 02/24/2018  Decreased Interest 0 0 0 0 0  Down, Depressed, Hopeless 0 0 0 0 0  PHQ - 2 Score 0 0 0 0 0  Altered sleeping 0 1 1 3 1   Tired, decreased energy 0 0 0 3 1  Change in appetite 0 0 0 0 0  Feeling bad or failure about yourself  0 0 0 0 0  Trouble concentrating 0 0 0 0 0  Moving slowly or fidgety/restless 0 0 0 0 0  Suicidal thoughts 0 0 0 0  0  PHQ-9 Score 0 1 1 6 2   Difficult doing work/chores - Not difficult at all Not difficult at all - Not difficult at all   PHQ-2/9 Result is negative.    Fall Risk: Fall Risk  10/21/2018 07/06/2018 06/22/2018 06/08/2018 03/12/2018  Falls in the past year? 0 0 0 0 No  Number falls in past yr: 0 0 0 0 -  Injury with Fall? 0 0 0 0 -     Assessment & Plan  1. Hypertension, benign  - lisinopril (ZESTRIL) 10 MG tablet; Take 2 tablets (20 mg total) by mouth daily.  Dispense: 90 tablet; Refill: 1  2. Alcoholism /alcohol abuse [facility])  Discussed importance of quitting drinking   3. Vitamin B1 deficiency  B1   4. Chronic obstructive pulmonary disease, unspecified COPD type ([place])  - Fluticasone-Umeclidin-Vilant (TRELEGY ELLIPTA) 100-62.5-25 MCG/INH AEPB; Inhale 1 puff into the lungs daily. In place of Anoro  Dispense: 180 each; Refill: 0  5. Atherosclerosis of aorta (HCC)  On statin and aspirin   6. B12 deficiency  On B12  7. PAD (peripheral artery disease) [facility])  Seeing Vascular surgeon   - referral  to [facility] for second opinion   8. Benign hypertension with chronic kidney disease, stage III (HCC)  Continue medication   9. Stage 3 chronic kidney disease ([place])  Recheck labs in one month, avoid nsaid's  I discussed the assessment and treatment plan with the patient. The patient was provided an opportunity to ask questions and all were answered. The patient agreed with the plan and demonstrated an understanding of the instructions.   The patient was advised to call back or seek an in-person evaluation if the symptoms worsen or if the condition fails to improve as anticipated.  I provided 25  minutes of non-face-to-face time during this encounter.  Loistine Chance, MD

## 2018-10-26 ENCOUNTER — Telehealth: Payer: Self-pay | Admitting: Family Medicine

## 2018-10-26 DIAGNOSIS — I739 Peripheral vascular disease, unspecified: Secondary | ICD-10-CM

## 2018-10-26 NOTE — Telephone Encounter (Signed)
Copied from [place] [id number]. Topic: Referral - Request for Referral >> Oct 26, 2018  4:12 PM Blase Mess A wrote: Has patient seen PCP for this complaint? Yes *If NO, is insurance requiring patient see PCP for this issue before PCP can refer them? Referral for which specialty: vasular surgeon Preferred provider/office: vascular [facility] [place] [address] [phone] Reason for referral: Wanting a 2nd option of his leg Patient is wanting a call back after the referral is placed. Thank you

## 2018-10-29 NOTE — Telephone Encounter (Signed)
Patient called in checking in on status of referral.

## 2018-11-03 ENCOUNTER — Ambulatory Visit (INDEPENDENT_AMBULATORY_CARE_PROVIDER_SITE_OTHER): Payer: BLUE CROSS/BLUE SHIELD | Admitting: Nurse Practitioner

## 2018-11-03 ENCOUNTER — Encounter (INDEPENDENT_AMBULATORY_CARE_PROVIDER_SITE_OTHER): Payer: BLUE CROSS/BLUE SHIELD

## 2018-11-05 ENCOUNTER — Ambulatory Visit (INDEPENDENT_AMBULATORY_CARE_PROVIDER_SITE_OTHER): Payer: BLUE CROSS/BLUE SHIELD | Admitting: Nurse Practitioner

## 2018-11-05 ENCOUNTER — Other Ambulatory Visit (INDEPENDENT_AMBULATORY_CARE_PROVIDER_SITE_OTHER): Payer: Self-pay | Admitting: Vascular Surgery

## 2018-11-05 ENCOUNTER — Other Ambulatory Visit: Payer: Self-pay

## 2018-11-05 ENCOUNTER — Ambulatory Visit (INDEPENDENT_AMBULATORY_CARE_PROVIDER_SITE_OTHER): Payer: BLUE CROSS/BLUE SHIELD

## 2018-11-05 ENCOUNTER — Encounter (INDEPENDENT_AMBULATORY_CARE_PROVIDER_SITE_OTHER): Payer: Self-pay | Admitting: Nurse Practitioner

## 2018-11-05 VITALS — BP 178/107 | HR 90 | Resp 10 | Ht 68.0 in | Wt 130.0 lb

## 2018-11-05 DIAGNOSIS — Z79899 Other long term (current) drug therapy: Secondary | ICD-10-CM | POA: Diagnosis not present

## 2018-11-05 DIAGNOSIS — Z720 Tobacco use: Secondary | ICD-10-CM | POA: Diagnosis not present

## 2018-11-05 DIAGNOSIS — I739 Peripheral vascular disease, unspecified: Secondary | ICD-10-CM

## 2018-11-05 DIAGNOSIS — I10 Essential (primary) hypertension: Secondary | ICD-10-CM

## 2018-11-05 NOTE — Progress Notes (Signed)
SUBJECTIVE:  Patient ID: Ryan [name], male    DOB: 04-18-1956, 63 y.o.   MRN: [id number] Chief Complaint  Patient presents with  . Follow-up    HPI  Ryan [name] is a 63 y.o. male The patient returns to the office for followup and review of the noninvasive studies. There have been no interval changes in lower extremity symptoms. No interval shortening of the patient's claudication distance or development of rest pain symptoms. No new ulcers or wounds have occurred since the last visit.  There have been no significant changes to the patient's overall health care.  The patient denies amaurosis fugax or recent TIA symptoms. There are no recent neurological changes noted. The patient denies history of DVT, PE or superficial thrombophlebitis. The patient denies recent episodes of angina or shortness of breath.   ABI Rt=1.03 and Lt=1.19  (previous ABI's Rt=0.30 and Lt=1.20) Duplex ultrasound of the right tibial arteries revealed biphasic waveforms with the left anterior tibial artery being biphasic in the posterior tibial artery being triphasic.  Past Medical History:  Diagnosis Date  . Hypertension   . Neuromuscular disorder (HCC)    numbness feet  . Peripheral vascular disease ([place])   . Shortness of breath dyspnea     Past Surgical History:  Procedure Laterality Date  . COLONOSCOPY WITH PROPOFOL N/A 02/15/2016   Procedure: COLONOSCOPY WITH PROPOFOL;  Surgeon: Lucilla Lame, MD;  Location: [facility];  Service: Endoscopy;  Laterality: N/A;  . HERNIA REPAIR  1999   left inguinal  . LOWER EXTREMITY ANGIOGRAPHY Right 05/07/2018   Procedure: LOWER EXTREMITY ANGIOGRAPHY;  Surgeon: Algernon Huxley, MD;  Location: [facility];  Service: Cardiovascular;  Laterality: Right;  . LOWER EXTREMITY ANGIOGRAPHY Right 06/25/2018   Procedure: LOWER EXTREMITY ANGIOGRAPHY;  Surgeon: Algernon Huxley, MD;  Location: [facility];  Service: Cardiovascular;  Laterality: Right;  .  LOWER EXTREMITY ANGIOGRAPHY Right 07/01/2018   Procedure: LOWER EXTREMITY ANGIOGRAPHY;  Surgeon: Algernon Huxley, MD;  Location: [facility];  Service: Cardiovascular;  Laterality: Right;  . LOWER EXTREMITY ANGIOGRAPHY Right 08/12/2018   Procedure: LOWER EXTREMITY ANGIOGRAPHY;  Surgeon: Algernon Huxley, MD;  Location: [facility];  Service: Cardiovascular;  Laterality: Right;  . LOWER EXTREMITY ANGIOGRAPHY Right 09/03/2018   Procedure: LOWER EXTREMITY ANGIOGRAPHY;  Surgeon: Algernon Huxley, MD;  Location: [facility];  Service: Cardiovascular;  Laterality: Right;  . LOWER EXTREMITY ANGIOGRAPHY Right 09/04/2018   Procedure: Lower Extremity Angiography;  Surgeon: Algernon Huxley, MD;  Location: [facility];  Service: Cardiovascular;  Laterality: Right;  . LOWER EXTREMITY ANGIOGRAPHY Right 10/01/2018   Procedure: LOWER EXTREMITY ANGIOGRAPHY;  Surgeon: Algernon Huxley, MD;  Location: [facility];  Service: Cardiovascular;  Laterality: Right;  . LOWER EXTREMITY ANGIOGRAPHY Right 10/01/2018   Procedure: Lower Extremity Angiography;  Surgeon: Algernon Huxley, MD;  Location: [facility];  Service: Cardiovascular;  Laterality: Right;  . LOWER EXTREMITY ANGIOGRAPHY Right 10/15/2018   Procedure: LOWER EXTREMITY ANGIOGRAPHY;  Surgeon: Algernon Huxley, MD;  Location: [facility];  Service: Cardiovascular;  Laterality: Right;  . LOWER EXTREMITY ANGIOGRAPHY Left 10/16/2018   Procedure: Lower Extremity Angiography;  Surgeon: Algernon Huxley, MD;  Location: [facility];  Service: Cardiovascular;  Laterality: Left;  . LOWER EXTREMITY INTERVENTION Right 07/02/2018   Procedure: LOWER EXTREMITY INTERVENTION;  Surgeon: Algernon Huxley, MD;  Location: [facility];  Service: Cardiovascular;  Laterality: Right;  . POLYPECTOMY N/A 02/15/2016   Procedure: POLYPECTOMY;  Surgeon: Lucilla Lame, MD;  Location: [facility];  Service: Endoscopy;  Laterality: N/A;    Social History    Socioeconomic History  . Marital status: Legally Separated    Spouse name: Ryan [name]  . Number of children: 5  . Years of education: Not on file  . Highest education level: High school graduate  Occupational History  . Occupation: [employer]    Comment: [name]  Social Needs  . Financial resource strain: Not hard at all  . Food insecurity:    Worry: Never true    Inability: Never true  . Transportation needs:    Medical: No    Non-medical: No  Tobacco Use  . Smoking status: Former Smoker    Packs/day: 0.10    Years: 40.00    Pack years: 4.00    Types: Cigarettes    Start date: 07/21/1978    Last attempt to quit: 08/28/2018    Years since quitting: 0.1  . Smokeless tobacco: Never Used  Substance and Sexual Activity  . Alcohol use: Yes    Alcohol/week: 12.0 standard drinks    Types: 12 Cans of beer per week  . Drug use: Never  . Sexual activity: Yes    Partners: Female    Birth control/protection: None  Lifestyle  . Physical activity:    Days per week: 6 days    Minutes per session: 60 min  . Stress: Not at all  Relationships  . Social connections:    Talks on phone: Three times a week    Gets together: Twice a week    Attends religious service: Never    Active member of club or organization: No    Attends meetings of clubs or organizations: Never    Relationship status: Married  . Intimate partner violence:    Fear of current or ex partner: No    Emotionally abused: No    Physically abused: No    Forced sexual activity: No  Other Topics Concern  . Not on file  Social History Narrative  . Not on file    Family History  Problem Relation Age of Onset  . COPD Mother   . Hypertension Father   . Heart attack Brother     No Known Allergies   Review of Systems   Review of Systems: Negative Unless Checked Constitutional: [] Weight loss  [] Fever  [] Chills Cardiac: [] Chest pain   []  Atrial Fibrillation  [] Palpitations   [] Shortness of breath when laying  flat   [] Shortness of breath with exertion. [] Shortness of breath at rest Vascular:  [] Pain in legs with walking   [] Pain in legs with standing [] Pain in legs when laying flat   [] Claudication    [] Pain in feet when laying flat    [] History of DVT   [] Phlebitis   [] Swelling in legs   [] Varicose veins   [] Non-healing ulcers Pulmonary:   [] Uses home oxygen   [] Productive cough   [] Hemoptysis   [] Wheeze  [] COPD   [] Asthma Neurologic:  [] Dizziness   [] Seizures  [] Blackouts [] History of stroke   [] History of TIA  [] Aphasia   [] Temporary Blindness   [] Weakness or numbness in arm   [] Weakness or numbness in leg Musculoskeletal:   [] Joint swelling   [] Joint pain   [] Low back pain  []  History of Knee Replacement [] Arthritis [] back Surgeries  []  Spinal Stenosis    Hematologic:  [] Easy bruising  []   Easy bleeding   [] Hypercoagulable state   [] Anemic Gastrointestinal:  [] Diarrhea   [] Vomiting  [] Gastroesophageal reflux/heartburn   [] Difficulty swallowing. [] Abdominal pain Genitourinary:  [] Chronic kidney disease   [] Difficult urination  [] Anuric   [] Blood in urine [] Frequent urination  [] Burning with urination   [] Hematuria Skin:  [] Rashes   [] Ulcers [] Wounds Psychological:  [] History of anxiety   []  History of major depression  []  Memory Difficulties      OBJECTIVE:   Physical Exam  BP (!) 178/107 (BP Location: Left Arm, Patient Position: Sitting, Cuff Size: Small)   Pulse 90   Resp 10   Ht 5\' 8"  (1.727 m)   Wt 130 lb (59 kg)   BMI 19.77 kg/m   Gen: WD/WN, NAD Head: [place]/AT, No temporalis wasting.  Ear/Nose/Throat: Hearing grossly intact, nares w/o erythema or drainage Eyes: PER, EOMI, sclera nonicteric.  Neck: Supple, no masses.  No JVD.  Pulmonary:  Good air movement, no use of accessory muscles.  Cardiac: RRR Vascular:  Vessel Right Left  Radial Palpable Palpable  Brachial Palpable Palpable  Femoral Palpable Palpable  Popliteal Palpable Palpable  Dorsalis Pedis Palpable Palpable  Posterior  Tibial Palpable Palpable   Gastrointestinal: soft, non-distended. No guarding/no peritoneal signs.  Musculoskeletal: M/S 5/5 throughout.  No deformity or atrophy.  Neurologic: Pain and light touch intact in extremities.  Symmetrical.  Speech is fluent. Motor exam as listed above. Psychiatric: Judgment intact, Mood & affect appropriate for pt's clinical situation. Dermatologic: No Venous rashes. No Ulcers Noted.  No changes consistent with cellulitis. Lymph : No Cervical lymphadenopathy, no lichenification or skin changes of chronic lymphedema.       ASSESSMENT AND PLAN:  1. PAD (peripheral artery disease) (HCC)  ABI Rt=1.03 and Lt=1.19  (previous ABI's Rt=0.30 and Lt=1.20) Duplex ultrasound of the right tibial arteries revealed biphasic waveforms with the left anterior tibial artery being biphasic in the posterior tibial artery being triphasic.   Recommend:  The patient is status post successful angiogram with intervention.  The patient reports that the claudication symptoms and leg pain is essentially gone.   The patient denies lifestyle limiting changes at this point in time.  No further invasive studies, angiography or surgery at this time The patient should continue walking and begin a more formal exercise program.  The patient should continue antiplatelet therapy and aggressive treatment of the lipid abnormalities  Continued smoking cessation was again discussed  The patient should continue wearing graduated compression socks 10-15 mmHg strength to control the mild edema.  Patient should undergo noninvasive studies in 3 months. The patient will follow up with me after the studies.   - VAS [place] ABI WITH/WO TBI; Future  2. Hypertension, benign Continue antihypertensive medications as already ordered, these medications have been reviewed and there are no changes at this time.   3. Tobacco use Patient continues to abstain from tobacco use.  This is encouraged for his vascular  health and overall health.   Current Outpatient Medications on File Prior to Visit  Medication Sig Dispense Refill  . apixaban (ELIQUIS) 5 MG TABS tablet Take 1 tablet (5 mg total) by mouth 2 (two) times daily. 180 tablet 3  . aspirin EC 81 MG tablet Take 1 tablet (81 mg total) by mouth daily. 90 tablet 3  . atorvastatin (LIPITOR) 20 MG tablet Take 1 tablet (20 mg total) by mouth daily. 90 tablet 3  . Cyanocobalamin (VITAMIN B-12) 1000 MCG SUBL Place 1 tablet (1,000 mcg total) under the tongue daily.  90 tablet 0  . Fluticasone-Umeclidin-Vilant (TRELEGY ELLIPTA) 100-62.5-25 MCG/INH AEPB Inhale 1 puff into the lungs daily. In place of An[place] 180 each 0  . HYDROcodone-acetaminophen (NORCO) 5-325 MG tablet Take 1-2 tablets by mouth every 6 (six) hours as needed for moderate pain or severe pain. 28 tablet 0  . lisinopril (ZESTRIL) 10 MG tablet Take 2 tablets (20 mg total) by mouth daily. 90 tablet 1  . nicotine (NICODERM CQ - DOSED IN MG/24 HOURS) 21 mg/24hr patch Place 1 patch (21 mg total) onto the skin daily. 28 patch 1  . thiamine (VITAMIN B-1) 50 MG tablet Take 1 tablet (50 mg total) by mouth daily. 30 tablet 2   No current facility-administered medications on file prior to visit.     There are no Patient Instructions on file for this visit. Return in about 3 months (around 02/05/2019) for PAD.   Kris Hartmann, NP  This note was completed with Sales executive.  Any errors are purely unintentional.

## 2018-11-11 ENCOUNTER — Telehealth: Payer: Self-pay

## 2018-11-11 DIAGNOSIS — I739 Peripheral vascular disease, unspecified: Secondary | ICD-10-CM

## 2018-11-11 NOTE — Telephone Encounter (Signed)
Copied from [place] [id number]. Topic: Referral - Status >> Nov 11, 2018  9:31 AM Margot Ables wrote: Reason for CRM: Mingo Amber with [facility] is going to document in the referral and close it. Pt is being following by the sister office, [facility]. Please advise her if there is a reason to reopen the referral or need to change offices. Ph# [phone]

## 2018-11-11 NOTE — Telephone Encounter (Signed)
Pt's daughter called in to follow up on referral. Daughter says that she was advised by [facility] that [facility] cancelled referral. I did update daughter per message below. Daughter says that she was advised by [facility] to have office place referral again but this time mark it as urgent.    Please assist.

## 2018-11-20 ENCOUNTER — Other Ambulatory Visit: Payer: Self-pay

## 2018-11-20 ENCOUNTER — Encounter: Payer: Self-pay | Admitting: Family Medicine

## 2018-11-20 ENCOUNTER — Ambulatory Visit: Payer: BLUE CROSS/BLUE SHIELD | Admitting: Family Medicine

## 2018-11-20 VITALS — BP 180/94 | HR 80 | Temp 98.0°F | Resp 14 | Ht 68.0 in | Wt 132.5 lb

## 2018-11-20 DIAGNOSIS — N183 Chronic kidney disease, stage 3 unspecified: Secondary | ICD-10-CM

## 2018-11-20 DIAGNOSIS — E538 Deficiency of other specified B group vitamins: Secondary | ICD-10-CM | POA: Diagnosis not present

## 2018-11-20 DIAGNOSIS — I739 Peripheral vascular disease, unspecified: Secondary | ICD-10-CM

## 2018-11-20 DIAGNOSIS — I129 Hypertensive chronic kidney disease with stage 1 through stage 4 chronic kidney disease, or unspecified chronic kidney disease: Secondary | ICD-10-CM

## 2018-11-20 DIAGNOSIS — I10 Essential (primary) hypertension: Secondary | ICD-10-CM

## 2018-11-20 DIAGNOSIS — J449 Chronic obstructive pulmonary disease, unspecified: Secondary | ICD-10-CM | POA: Diagnosis not present

## 2018-11-20 DIAGNOSIS — D649 Anemia, unspecified: Secondary | ICD-10-CM

## 2018-11-20 DIAGNOSIS — F1020 Alcohol dependence, uncomplicated: Secondary | ICD-10-CM

## 2018-11-20 DIAGNOSIS — E519 Thiamine deficiency, unspecified: Secondary | ICD-10-CM | POA: Diagnosis not present

## 2018-11-20 DIAGNOSIS — IMO0001 Reserved for inherently not codable concepts without codable children: Secondary | ICD-10-CM

## 2018-11-20 MED ORDER — LISINOPRIL 10 MG PO TABS: 10.0000 mg | ORAL_TABLET | Freq: Two times a day (BID) | ORAL | 0 refills | Status: DC

## 2018-11-20 MED ORDER — FLUTICASONE-UMECLIDIN-VILANT 100-62.5-25 MCG/INH IN AEPB: 1.0000 | INHALATION_SPRAY | Freq: Every day | RESPIRATORY_TRACT | 2 refills | Status: DC

## 2018-11-20 NOTE — Progress Notes (Signed)
Name: Ryan [name]   MRN: [id number]    DOB: 29-Jul-1955   Date:11/20/2018       Progress Note  Subjective  Chief Complaint  Chief Complaint  Patient presents with  . COPD    HPI  Paresthesia: with low B1 and B12, he is not taking B1 supplementation.   He states tingling is slightly better but still present, taking gabapentin and seems to be working.   Alcoholism: he is still drinking, does not want to quit , but trying to cut down   PVD: s/p intervention of right lower extremity done by Dr. Lucky Cowboy on 07/02/2017 and again on 09/2018 secondary to ischemic pain on right foot . Since procedure only pain from procedure but not the pain that he had before.   Anemia: he had to get blood transfusion during his last hospital stay, we will need to recheck labs , if still anemic, we will refer to GI   COPD: he is on Trelegy , he has occasional SOB and wheezing, he states he does not cough.Spirometry last visit  showed Fev1/FVC at 80.  He feels well on medication   Malnutrition: likely multifactorial. He has lost 5 % of his weight in the past 6 months, but he has gained weight since last visit. He has stopped smoking.He is back to his baseline weight around 130 lbs. He states since he stopped smoking he has a good appetite,he states weight at the hospital was 128 lbs, but he was 130 lbs back in January, he gained a little weight since last visit up 2 lbs    Atherosclerosis aorta/PAD: atherosclerosis found on CT. He is on Eliquis, Atorvastatin and aspirin. He was also admitted with ischemic leg pain, and had  Mechanical thrombectomy and precutaneious transluminal angioplasty of anterior tibial artery. He was discharged on 10/19/2018 and is doing well, he still wants to see another vascular surgeon , we will make sure it gets done this time, previous one was cancelled.   Acute on chronic kidney disease: reviewed labs done during hospital visits month of April, last GFR showed mild improvement  up to 29. We will need to recheck labs today  Patient Active Problem List   Diagnosis Date Noted  . Stage 3 chronic kidney disease ([place]) 10/21/2018  . Atherosclerosis of native arteries of extremity with rest pain ([place]) 10/13/2018  . PAD (peripheral artery disease) ([place]) 07/21/2018  . Ischemic leg 06/25/2018  . Claudication ([place]) 03/17/2018  . B12 deficiency 03/12/2018  . Vitamin B1 deficiency 03/12/2018  . Varicose veins of leg with pain, right 03/12/2018  . Enlarged thoracic aorta ([place]) 02/24/2018  . Alcoholism /alcohol abuse ([place]) 02/24/2018  . Paresthesia 02/24/2018  . Ectatic aorta ([place]) 02/20/2018  . Emphysema lung ([place]) 02/20/2018  . Atherosclerosis of aorta ([place]) 02/20/2018  . Encounter for tobacco use cessation counseling   . Benign neoplasm of cecum   . Benign neoplasm of transverse colon   . Benign neoplasm of sigmoid colon   . Hypertension, benign 08/31/2015  . Tobacco use 08/31/2015  . Abnormal CXR 08/31/2015    Past Surgical History:  Procedure Laterality Date  . COLONOSCOPY WITH PROPOFOL N/A 02/15/2016   Procedure: COLONOSCOPY WITH PROPOFOL;  Surgeon: Lucilla Lame, MD;  Location: [facility];  Service: Endoscopy;  Laterality: N/A;  . HERNIA REPAIR  1999   left inguinal  . LOWER EXTREMITY ANGIOGRAPHY Right 05/07/2018   Procedure: LOWER EXTREMITY ANGIOGRAPHY;  Surgeon: Algernon Huxley, MD;  Location: [facility];  Service: Cardiovascular;  Laterality: Right;  . LOWER EXTREMITY ANGIOGRAPHY Right 06/25/2018   Procedure: LOWER EXTREMITY ANGIOGRAPHY;  Surgeon: Algernon Huxley, MD;  Location: [facility];  Service: Cardiovascular;  Laterality: Right;  . LOWER EXTREMITY ANGIOGRAPHY Right 07/01/2018   Procedure: LOWER EXTREMITY ANGIOGRAPHY;  Surgeon: Algernon Huxley, MD;  Location: [facility];  Service: Cardiovascular;  Laterality: Right;  . LOWER EXTREMITY ANGIOGRAPHY Right 08/12/2018   Procedure: LOWER EXTREMITY ANGIOGRAPHY;  Surgeon: Algernon Huxley,  MD;  Location: [facility];  Service: Cardiovascular;  Laterality: Right;  . LOWER EXTREMITY ANGIOGRAPHY Right 09/03/2018   Procedure: LOWER EXTREMITY ANGIOGRAPHY;  Surgeon: Algernon Huxley, MD;  Location: [facility];  Service: Cardiovascular;  Laterality: Right;  . LOWER EXTREMITY ANGIOGRAPHY Right 09/04/2018   Procedure: Lower Extremity Angiography;  Surgeon: Algernon Huxley, MD;  Location: [facility];  Service: Cardiovascular;  Laterality: Right;  . LOWER EXTREMITY ANGIOGRAPHY Right 10/01/2018   Procedure: LOWER EXTREMITY ANGIOGRAPHY;  Surgeon: Algernon Huxley, MD;  Location: [facility];  Service: Cardiovascular;  Laterality: Right;  . LOWER EXTREMITY ANGIOGRAPHY Right 10/01/2018   Procedure: Lower Extremity Angiography;  Surgeon: Algernon Huxley, MD;  Location: [facility];  Service: Cardiovascular;  Laterality: Right;  . LOWER EXTREMITY ANGIOGRAPHY Right 10/15/2018   Procedure: LOWER EXTREMITY ANGIOGRAPHY;  Surgeon: Algernon Huxley, MD;  Location: [facility];  Service: Cardiovascular;  Laterality: Right;  . LOWER EXTREMITY ANGIOGRAPHY Left 10/16/2018   Procedure: Lower Extremity Angiography;  Surgeon: Algernon Huxley, MD;  Location: [facility];  Service: Cardiovascular;  Laterality: Left;  . LOWER EXTREMITY INTERVENTION Right 07/02/2018   Procedure: LOWER EXTREMITY INTERVENTION;  Surgeon: Algernon Huxley, MD;  Location: [facility];  Service: Cardiovascular;  Laterality: Right;  . POLYPECTOMY N/A 02/15/2016   Procedure: POLYPECTOMY;  Surgeon: Lucilla Lame, MD;  Location: [facility];  Service: Endoscopy;  Laterality: N/A;    Family History  Problem Relation Age of Onset  . COPD Mother   . Hypertension Father   . Heart attack Brother     Social History   Socioeconomic History  . Marital status: Legally Separated    Spouse name: [name]  . Number of children: 5  . Years of education: Not on file  . Highest education level: High  school graduate  Occupational History  . Occupation: Forklift    Comment: [name]  Social Needs  . Financial resource strain: Not hard at all  . Food insecurity:    Worry: Never true    Inability: Never true  . Transportation needs:    Medical: No    Non-medical: No  Tobacco Use  . Smoking status: Former Smoker    Packs/day: 0.10    Years: 40.00    Pack years: 4.00    Types: Cigarettes    Start date: 07/21/1978    Last attempt to quit: 08/28/2018    Years since quitting: 0.2  . Smokeless tobacco: Never Used  Substance and Sexual Activity  . Alcohol use: Yes    Alcohol/week: 12.0 standard drinks    Types: 12 Cans of beer per week  . Drug use: Never  . Sexual activity: Yes    Partners: Female    Birth control/protection: None  Lifestyle  . Physical activity:    Days per week: 6 days    Minutes per session: 60 min  . Stress: Not  at all  Relationships  . Social connections:    Talks on phone: Three times a week    Gets together: Twice a week    Attends religious service: Never    Active member of club or organization: No    Attends meetings of clubs or organizations: Never    Relationship status: Married  . Intimate partner violence:    Fear of current or ex partner: No    Emotionally abused: No    Physically abused: No    Forced sexual activity: No  Other Topics Concern  . Not on file  Social History Narrative  . Not on file     Current Outpatient Medications:  .  apixaban (ELIQUIS) 5 MG TABS tablet, Take 1 tablet (5 mg total) by mouth 2 (two) times daily., Disp: 180 tablet, Rfl: 3 .  aspirin EC 81 MG tablet, Take 1 tablet (81 mg total) by mouth daily., Disp: 90 tablet, Rfl: 3 .  atorvastatin (LIPITOR) 20 MG tablet, Take 1 tablet (20 mg total) by mouth daily., Disp: 90 tablet, Rfl: 3 .  Cyanocobalamin (VITAMIN B-12) 1000 MCG SUBL, Place 1 tablet (1,000 mcg total) under the tongue daily., Disp: 90 tablet, Rfl: 0 .  Fluticasone-Umeclidin-Vilant (TRELEGY  ELLIPTA) 100-62.5-25 MCG/INH AEPB, Inhale 1 puff into the lungs daily. In place of [place], Disp: 180 each, Rfl: 0 .  HYDROcodone-acetaminophen (NORCO) 5-325 MG tablet, Take 1-2 tablets by mouth every 6 (six) hours as needed for moderate pain or severe pain., Disp: 28 tablet, Rfl: 0 .  lisinopril (ZESTRIL) 10 MG tablet, Take 2 tablets (20 mg total) by mouth daily., Disp: 90 tablet, Rfl: 1 .  nicotine (NICODERM CQ - DOSED IN MG/24 HOURS) 21 mg/24hr patch, Place 1 patch (21 mg total) onto the skin daily., Disp: 28 patch, Rfl: 1 .  thiamine (VITAMIN B-1) 50 MG tablet, Take 1 tablet (50 mg total) by mouth daily., Disp: 30 tablet, Rfl: 2  No Known Allergies  I personally reviewed active problem list, medication list, allergies, family history, social history with the patient/caregiver today.   ROS  Ten systems reviewed and is negative except as mentioned in HPI   Objective  Vitals:   11/20/18 1503  BP: (!) 142/94  Pulse: 80  Resp: 14  Temp: 98 F (36.7 C)  TempSrc: Oral  SpO2: 93%  Weight: 132 lb 8 oz (60.1 kg)  Height: 5\' 8"  (1.727 m)    Body mass index is 20.15 kg/m.  Physical Exam  Constitutional: Patient appears well-developed and well-nourished. No distress.  HEENT: head atraumatic, normocephalic, pupils equal and reactive to light,  neck supple Cardiovascular: Normal rate, regular rhythm and normal heart sounds.  No murmur heard. No BLE edema. Pulmonary/Chest: Effort normal and breath sounds normal. No respiratory distress. Abdominal: Soft.  There is no tenderness. Psychiatric: Patient has a normal mood and affect. behavior is normal. Judgment and thought content normal.  Recent Results (from the past 2160 hour(s))  Glucose, capillary     Status: Abnormal   Collection Time: 09/03/18  4:57 PM  Result Value Ref Range   Glucose-Capillary 135 (H) 70 - 99 mg/dL  Heparin level (unfractionated) every 6 hours x 4 post-procedure     Status: None   Collection Time: 09/03/18  5:01  PM  Result Value Ref Range   Heparin Unfractionated 0.36 0.30 - 0.70 IU/mL    Comment: (NOTE) If heparin results are below expected values, and patient dosage has  been confirmed, suggest follow up  testing of antithrombin III levels. Performed at [facility], [address] [place], [place] [address]   CBC every 6 hours x 4 post-procedure     Status: Abnormal   Collection Time: 09/03/18  5:01 PM  Result Value Ref Range   WBC 7.6 4.0 - 10.5 K/uL   RBC 3.58 (L) 4.22 - 5.81 MIL/uL   Hemoglobin 11.3 (L) 13.0 - 17.0 g/dL   HCT 33.7 (L) 39.0 - 52.0 %   MCV 94.1 80.0 - 100.0 fL   MCH 31.6 26.0 - 34.0 pg   MCHC 33.5 30.0 - 36.0 g/dL   RDW 12.0 11.5 - 15.5 %   Platelets 250 150 - 400 K/uL   nRBC 0.0 0.0 - 0.2 %    Comment: Performed at [facility], [address] [place], [place] [address]  Fibrinogen every 6 hours x 4 post-procedure     Status: Abnormal   Collection Time: 09/03/18  5:01 PM  Result Value Ref Range   Fibrinogen 607 (H) 210 - 475 mg/dL    Comment: Performed at [facility], [address] [place], [place] [address]  MRSA PCR Screening     Status: None   Collection Time: 09/03/18  5:19 PM  Result Value Ref Range   MRSA by PCR NEGATIVE NEGATIVE    Comment:        The GeneXpert MRSA Assay (FDA approved for NASAL specimens only), is one component of a comprehensive MRSA colonization surveillance program. It is not intended to diagnose MRSA infection nor to guide or monitor treatment for MRSA infections. Performed at [facility], [place], [place] [address]   Heparin level (unfractionated) every 6 hours x 4 post-procedure     Status: Abnormal   Collection Time: 09/03/18 10:19 PM  Result Value Ref Range   Heparin Unfractionated 0.28 (L) 0.30 - 0.70 IU/mL    Comment: (NOTE) If heparin results are below expected values, and patient dosage has  been confirmed, suggest follow up testing of antithrombin III  levels. Performed at [facility], [address] [place], [place] [address]   CBC every 6 hours x 4 post-procedure     Status: Abnormal   Collection Time: 09/03/18 10:19 PM  Result Value Ref Range   WBC 8.9 4.0 - 10.5 K/uL   RBC 3.69 (L) 4.22 - 5.81 MIL/uL   Hemoglobin 11.6 (L) 13.0 - 17.0 g/dL   HCT 34.5 (L) 39.0 - 52.0 %   MCV 93.5 80.0 - 100.0 fL   MCH 31.4 26.0 - 34.0 pg   MCHC 33.6 30.0 - 36.0 g/dL   RDW 12.1 11.5 - 15.5 %   Platelets 230 150 - 400 K/uL   nRBC 0.0 0.0 - 0.2 %    Comment: Performed at [facility], [address] [place],  [address]  Fibrinogen every 6 hours x 4 post-procedure     Status: Abnormal   Collection Time: 09/03/18 10:19 PM  Result Value Ref Range   Fibrinogen 502 (H) 210 - 475 mg/dL    Comment: Performed at [facility], [place], [place] [address]  Heparin level (unfractionated) every 6 hours x 4 post-procedure     Status: None   Collection Time: 09/04/18  3:31 AM  Result Value Ref Range   Heparin Unfractionated 0.39 0.30 - 0.70 IU/mL    Comment: (NOTE) If heparin results are below expected values, and patient dosage has  been confirmed, suggest  follow up testing of antithrombin III levels. Performed at [facility], [address] [place], [place] [address]   CBC every 6 hours x 4 post-procedure     Status: Abnormal   Collection Time: 09/04/18  3:31 AM  Result Value Ref Range   WBC 7.2 4.0 - 10.5 K/uL   RBC 3.41 (L) 4.22 - 5.81 MIL/uL   Hemoglobin 10.8 (L) 13.0 - 17.0 g/dL   HCT 32.5 (L) 39.0 - 52.0 %   MCV 95.3 80.0 - 100.0 fL   MCH 31.7 26.0 - 34.0 pg   MCHC 33.2 30.0 - 36.0 g/dL   RDW 12.1 11.5 - 15.5 %   Platelets 186 150 - 400 K/uL   nRBC 0.0 0.0 - 0.2 %    Comment: Performed at [facility], [address] [place], [place] [address]  Fibrinogen every 6 hours x 4 post-procedure     Status: None   Collection Time: 09/04/18  3:31 AM  Result Value Ref Range    Fibrinogen 384 210 - 475 mg/dL    Comment: Performed at [facility], [address] [place], [place] [address]  Basic metabolic panel     Status: Abnormal   Collection Time: 09/04/18  3:31 AM  Result Value Ref Range   Sodium 138 135 - 145 mmol/L   Potassium 4.1 3.5 - 5.1 mmol/L   Chloride 110 98 - 111 mmol/L   CO2 22 22 - 32 mmol/L   Glucose, Bld 116 (H) 70 - 99 mg/dL   BUN 18 8 - 23 mg/dL   Creatinine, Ser 1.71 (H) 0.61 - 1.24 mg/dL   Calcium 8.2 (L) 8.9 - 10.3 mg/dL   GFR calc non Af Amer 42 (L) >60 mL/min   GFR calc Af Amer 49 (L) >60 mL/min   Anion gap 6 5 - 15    Comment: Performed at [facility], [address] [place], [place] [address]  CBC     Status: Abnormal   Collection Time: 09/05/18  3:43 AM  Result Value Ref Range   WBC 6.1 4.0 - 10.5 K/uL   RBC 2.67 (L) 4.22 - 5.81 MIL/uL   Hemoglobin 8.5 (L) 13.0 - 17.0 g/dL   HCT 25.3 (L) 39.0 - 52.0 %   MCV 94.8 80.0 - 100.0 fL   MCH 31.8 26.0 - 34.0 pg   MCHC 33.6 30.0 - 36.0 g/dL   RDW 12.2 11.5 - 15.5 %   Platelets 160 150 - 400 K/uL   nRBC 0.0 0.0 - 0.2 %    Comment: Performed at [facility], [address] [place],  [address]  Basic metabolic panel     Status: Abnormal   Collection Time: 09/05/18  3:43 AM  Result Value Ref Range   Sodium 136 135 - 145 mmol/L   Potassium 3.9 3.5 - 5.1 mmol/L   Chloride 108 98 - 111 mmol/L   CO2 21 (L) 22 - 32 mmol/L   Glucose, Bld 113 (H) 70 - 99 mg/dL   BUN 16 8 - 23 mg/dL   Creatinine, Ser 1.58 (H) 0.61 - 1.24 mg/dL   Calcium 8.2 (L) 8.9 - 10.3 mg/dL   GFR calc non Af Amer 47 (L) >60 mL/min   GFR calc Af Amer 54 (L) >60 mL/min   Anion gap 7 5 - 15    Comment: Performed at [facility], [address], [place],  [address]  CBC     Status:  Abnormal   Collection Time: 09/06/18  4:21 AM  Result Value Ref Range   WBC 5.1 4.0 - 10.5 K/uL   RBC 2.60 (L) 4.22 - 5.81 MIL/uL   Hemoglobin 8.1 (L) 13.0 - 17.0 g/dL   HCT  24.7 (L) 39.0 - 52.0 %   MCV 95.0 80.0 - 100.0 fL   MCH 31.2 26.0 - 34.0 pg   MCHC 32.8 30.0 - 36.0 g/dL   RDW 12.0 11.5 - 15.5 %   Platelets 169 150 - 400 K/uL   nRBC 0.0 0.0 - 0.2 %    Comment: Performed at [facility], [address] [place], [place] [address]  Basic metabolic panel     Status: Abnormal   Collection Time: 09/06/18  4:21 AM  Result Value Ref Range   Sodium 139 135 - 145 mmol/L   Potassium 4.0 3.5 - 5.1 mmol/L   Chloride 111 98 - 111 mmol/L   CO2 22 22 - 32 mmol/L   Glucose, Bld 105 (H) 70 - 99 mg/dL   BUN 14 8 - 23 mg/dL   Creatinine, Ser 1.30 (H) 0.61 - 1.24 mg/dL   Calcium 8.2 (L) 8.9 - 10.3 mg/dL   GFR calc non Af Amer 59 (L) >60 mL/min   GFR calc Af Amer >60 >60 mL/min   Anion gap 6 5 - 15    Comment: Performed at [facility], [address] [place], [place] [address]  Magnesium     Status: Abnormal   Collection Time: 09/06/18  4:21 AM  Result Value Ref Range   Magnesium 0.3 (LL) 1.7 - 2.4 mg/dL    Comment: CRITICAL RESULT CALLED TO, READ BACK BY AND VERIFIED WITH CYNTHIA VAZQUEZ 09/06/2018 0523 KBH Performed at [facility], [address] [place], [place] [address]   Phosphorus     Status: None   Collection Time: 09/06/18  4:21 AM  Result Value Ref Range   Phosphorus 3.1 2.5 - 4.6 mg/dL    Comment: Performed at [facility], [address] [place], [place] [address]  Glucose, capillary     Status: None   Collection Time: 09/06/18  7:11 AM  Result Value Ref Range   Glucose-Capillary 98 70 - 99 mg/dL  BUN     Status: None   Collection Time: 10/01/18  9:05 AM  Result Value Ref Range   BUN 22 8 - 23 mg/dL    Comment: Performed at [facility], [address] [place], [place] [address]  Creatinine, serum     Status: Abnormal   Collection Time: 10/01/18  9:05 AM  Result Value Ref Range   Creatinine, Ser 1.89 (H) 0.61 - 1.24 mg/dL   GFR calc non Af Amer 37 (L) >60 mL/min   GFR calc Af Amer 43  (L) >60 mL/min    Comment: Performed at [facility], [address] [place], [place] [address]  Basic metabolic panel     Status: Abnormal   Collection Time: 10/02/18  5:42 AM  Result Value Ref Range   Sodium 138 135 - 145 mmol/L   Potassium 4.6 3.5 - 5.1 mmol/L   Chloride 110 98 - 111 mmol/L   CO2 20 (L) 22 - 32 mmol/L   Glucose, Bld 113 (H) 70 - 99 mg/dL   BUN 19 8 - 23 mg/dL   Creatinine, Ser 1.65 (H) 0.61 - 1.24 mg/dL   Calcium 8.0 (L) 8.9 - 10.3 mg/dL   GFR calc non  Af Amer 44 (L) >60 mL/min   GFR calc Af Amer 51 (L) >60 mL/min   Anion gap 8 5 - 15    Comment: Performed at [facility], [address] [place], [place] [address]  BUN     Status: None   Collection Time: 10/15/18  9:41 AM  Result Value Ref Range   BUN 16 8 - 23 mg/dL    Comment: Performed at [facility], [address] [place], [place] [address]  Creatinine, serum     Status: Abnormal   Collection Time: 10/15/18  9:41 AM  Result Value Ref Range   Creatinine, Ser 1.51 (H) 0.61 - 1.24 mg/dL   GFR calc non Af Amer 49 (L) >60 mL/min   GFR calc Af Amer 57 (L) >60 mL/min    Comment: Performed at [facility], [place], [place] [address]  Glucose, capillary     Status: Abnormal   Collection Time: 10/15/18  1:19 PM  Result Value Ref Range   Glucose-Capillary 129 (H) 70 - 99 mg/dL  Heparin level (unfractionated) every 6 hours x 4 post-procedure     Status: None   Collection Time: 10/15/18  1:30 PM  Result Value Ref Range   Heparin Unfractionated 0.44 0.30 - 0.70 IU/mL    Comment: (NOTE) If heparin results are below expected values, and patient dosage has  been confirmed, suggest follow up testing of antithrombin III levels. Performed at [facility], [address] [place], [place] [address]   CBC every 6 hours x 4 post-procedure     Status: Abnormal   Collection Time: 10/15/18  1:30 PM  Result Value Ref Range   WBC 16.9 (H) 4.0 - 10.5 K/uL    RBC 2.72 (L) 4.22 - 5.81 MIL/uL   Hemoglobin 8.1 (L) 13.0 - 17.0 g/dL   HCT 25.3 (L) 39.0 - 52.0 %   MCV 93.0 80.0 - 100.0 fL   MCH 29.8 26.0 - 34.0 pg   MCHC 32.0 30.0 - 36.0 g/dL   RDW 13.1 11.5 - 15.5 %   Platelets 348 150 - 400 K/uL   nRBC 0.0 0.0 - 0.2 %    Comment: Performed at [facility], [address] [place], [place] [address]  Fibrinogen every 6 hours x 4 post-procedure     Status: None   Collection Time: 10/15/18  1:30 PM  Result Value Ref Range   Fibrinogen 308 210 - 475 mg/dL    Comment: Performed at [facility], [place], [place] [address]  Heparin level (unfractionated) every 6 hours x 4 post-procedure     Status: None   Collection Time: 10/15/18  5:12 PM  Result Value Ref Range   Heparin Unfractionated 0.34 0.30 - 0.70 IU/mL    Comment: (NOTE) If heparin results are below expected values, and patient dosage has  been confirmed, suggest follow up testing of antithrombin III levels. Performed at [facility], [address] [place], [place] [address]   CBC every 6 hours x 4 post-procedure     Status: Abnormal   Collection Time: 10/15/18  5:12 PM  Result Value Ref Range   WBC 14.0 (H) 4.0 - 10.5 K/uL   RBC 2.65 (L) 4.22 - 5.81 MIL/uL   Hemoglobin 7.8 (L) 13.0 - 17.0 g/dL   HCT 24.0 (L) 39.0 - 52.0 %   MCV 90.6 80.0 - 100.0 fL   MCH 29.4 26.0 - 34.0 pg  MCHC 32.5 30.0 - 36.0 g/dL   RDW 12.9 11.5 - 15.5 %   Platelets 256 150 - 400 K/uL   nRBC 0.0 0.0 - 0.2 %    Comment: Performed at [facility], [address] [place], [place] [address]  Fibrinogen every 6 hours x 4 post-procedure     Status: Abnormal   Collection Time: 10/15/18  5:12 PM  Result Value Ref Range   Fibrinogen 173 (L) 210 - 475 mg/dL    Comment: Performed at [facility], [place], [place] [address]  Heparin level (unfractionated) every 6 hours x 4 post-procedure     Status: Abnormal   Collection Time:  10/15/18 11:03 PM  Result Value Ref Range   Heparin Unfractionated 0.11 (L) 0.30 - 0.70 IU/mL    Comment: (NOTE) If heparin results are below expected values, and patient dosage has  been confirmed, suggest follow up testing of antithrombin III levels. Performed at [facility], [address] [place], [place] [address]   CBC every 6 hours x 4 post-procedure     Status: Abnormal   Collection Time: 10/15/18 11:03 PM  Result Value Ref Range   WBC 9.5 4.0 - 10.5 K/uL   RBC 2.45 (L) 4.22 - 5.81 MIL/uL   Hemoglobin 7.2 (L) 13.0 - 17.0 g/dL   HCT 22.3 (L) 39.0 - 52.0 %   MCV 91.0 80.0 - 100.0 fL   MCH 29.4 26.0 - 34.0 pg   MCHC 32.3 30.0 - 36.0 g/dL   RDW 13.1 11.5 - 15.5 %   Platelets 211 150 - 400 K/uL   nRBC 0.0 0.0 - 0.2 %    Comment: Performed at [facility], [address] [place], [place] [address]  Fibrinogen every 6 hours x 4 post-procedure     Status: Abnormal   Collection Time: 10/15/18 11:03 PM  Result Value Ref Range   Fibrinogen 120 (L) 210 - 475 mg/dL    Comment: Performed at [facility], [address] [place], [place] [address]  MRSA PCR Screening     Status: None   Collection Time: 10/15/18 11:03 PM  Result Value Ref Range   MRSA by PCR NEGATIVE NEGATIVE    Comment:        The GeneXpert MRSA Assay (FDA approved for NASAL specimens only), is one component of a comprehensive MRSA colonization surveillance program. It is not intended to diagnose MRSA infection nor to guide or monitor treatment for MRSA infections. Performed at [facility], [address] [place],  [address]   Basic metabolic panel     Status: Abnormal   Collection Time: 10/16/18  4:48 AM  Result Value Ref Range   Sodium 139 135 - 145 mmol/L   Potassium 4.7 3.5 - 5.1 mmol/L   Chloride 109 98 - 111 mmol/L   CO2 22 22 - 32 mmol/L   Glucose, Bld 121 (H) 70 - 99 mg/dL   BUN 25 (H) 8 - 23 mg/dL   Creatinine, Ser 2.81 (H) 0.61 - 1.24 mg/dL    Calcium 8.3 (L) 8.9 - 10.3 mg/dL   GFR calc non Af Amer 23 (L) >60 mL/min   GFR calc Af Amer 27 (L) >60 mL/min   Anion gap 8 5 - 15    Comment: Performed at [facility], [address] [place], [place] [address]  Heparin level (unfractionated) every 6 hours x 4 post-procedure     Status: Abnormal  Collection Time: 10/16/18  4:48 AM  Result Value Ref Range   Heparin Unfractionated 0.12 (L) 0.30 - 0.70 IU/mL    Comment: (NOTE) If heparin results are below expected values, and patient dosage has  been confirmed, suggest follow up testing of antithrombin III levels. Performed at [facility], [address] [place], [place] [address]   CBC every 6 hours x 4 post-procedure     Status: Abnormal   Collection Time: 10/16/18  4:48 AM  Result Value Ref Range   WBC 7.4 4.0 - 10.5 K/uL   RBC 2.35 (L) 4.22 - 5.81 MIL/uL   Hemoglobin 6.9 (L) 13.0 - 17.0 g/dL   HCT 21.6 (L) 39.0 - 52.0 %   MCV 91.9 80.0 - 100.0 fL   MCH 29.4 26.0 - 34.0 pg   MCHC 31.9 30.0 - 36.0 g/dL   RDW 13.3 11.5 - 15.5 %   Platelets 206 150 - 400 K/uL   nRBC 0.0 0.0 - 0.2 %    Comment: Performed at [facility], [address] [place], [place] [address]  Fibrinogen every 6 hours x 4 post-procedure     Status: Abnormal   Collection Time: 10/16/18  4:48 AM  Result Value Ref Range   Fibrinogen 156 (L) 210 - 475 mg/dL    Comment: Performed at [facility], [address], [place], [place] [address]  ABO/Rh     Status: None   Collection Time: 10/16/18  4:48 AM  Result Value Ref Range   ABO/RH(D)      A POS Performed at [facility], [address] [place], [place] [address]   Type and screen [place]     Status: None   Collection Time: 10/16/18  5:52 AM  Result Value Ref Range   ABO/RH(D) A POS    Antibody Screen NEG    Sample Expiration 10/19/2018    Unit Number [id number]    Blood Component Type RED CELLS,LR    Unit division 00     Status of Unit ISSUED,FINAL    Transfusion Status OK TO TRANSFUSE    Crossmatch Result Compatible    Unit Number [id number]    Blood Component Type RED CELLS,LR    Unit division 00    Status of Unit REL FROM [facility]    Transfusion Status OK TO TRANSFUSE    Crossmatch Result Compatible    Unit Number [id number]    Blood Component Type RED CELLS,LR    Unit division 00    Status of Unit REL FROM [facility]    Transfusion Status OK TO TRANSFUSE    Crossmatch Result Compatible    Unit Number [id number]    Blood Component Type RED CELLS,LR    Unit division 00    Status of Unit ISSUED,FINAL    Transfusion Status OK TO TRANSFUSE    Crossmatch Result      Compatible Performed at [facility], [address] [place], [place] [address]   BPAM RBC     Status: None   Collection Time: 10/16/18  5:52 AM  Result Value Ref Range   ISSUE DATE / TIME 790240973532    Blood Product Unit Number [id number]    PRODUCT CODE [id number]    Unit Type and Rh 5100    Blood Product Expiration Date 119417408144    Blood Product Unit Number [id number]    Unit Type and Rh 3785    Blood Product Expiration Date 885027741287  Blood Product Unit Number [id number]    Unit Type and Rh 1308    Blood Product Expiration Date 657846962952    ISSUE DATE / TIME 841324401027    Blood Product Unit Number [id number]    PRODUCT CODE [id number]    Unit Type and Rh 6200    Blood Product Expiration Date 643329518841   Prepare RBC     Status: None   Collection Time: 10/16/18  6:00 AM  Result Value Ref Range   Order Confirmation      ORDER PROCESSED BY BLOOD BANK Performed at [facility], [address] [place], [place] [address]   CBC     Status: Abnormal   Collection Time: 10/16/18  5:23 PM  Result Value Ref Range   WBC 7.2 4.0 - 10.5 K/uL   RBC 2.07 (L) 4.22 - 5.81 MIL/uL   Hemoglobin 6.1 (L) 13.0 - 17.0 g/dL   HCT 18.9 (L) 39.0 - 52.0 %   MCV 91.3 80.0 - 100.0 fL   MCH 29.5  26.0 - 34.0 pg   MCHC 32.3 30.0 - 36.0 g/dL   RDW 13.5 11.5 - 15.5 %   Platelets 201 150 - 400 K/uL   nRBC 0.0 0.0 - 0.2 %    Comment: Performed at [facility], [address] [place], [place] [address]  Prepare RBC     Status: None   Collection Time: 10/16/18  6:04 PM  Result Value Ref Range   Order Confirmation      C/ BROOKE GREGORY AT 1809  09/32/3557 KMP DUPLICATE REQUEST Performed at [facility], [address] [place], [place] [address]   Hemoglobin and hematocrit, blood     Status: Abnormal   Collection Time: 10/16/18 11:12 PM  Result Value Ref Range   Hemoglobin 7.0 (L) 13.0 - 17.0 g/dL   HCT 21.0 (L) 39.0 - 52.0 %    Comment: Performed at [facility], [address] [place], [place] [address]  Basic metabolic panel     Status: Abnormal   Collection Time: 10/17/18  4:36 AM  Result Value Ref Range   Sodium 138 135 - 145 mmol/L   Potassium 4.5 3.5 - 5.1 mmol/L   Chloride 109 98 - 111 mmol/L   CO2 21 (L) 22 - 32 mmol/L   Glucose, Bld 105 (H) 70 - 99 mg/dL   BUN 34 (H) 8 - 23 mg/dL   Creatinine, Ser 3.57 (H) 0.61 - 1.24 mg/dL   Calcium 8.0 (L) 8.9 - 10.3 mg/dL   GFR calc non Af Amer 17 (L) >60 mL/min   GFR calc Af Amer 20 (L) >60 mL/min   Anion gap 8 5 - 15    Comment: Performed at [facility], [address] [place], [place] [address]  CBC     Status: Abnormal   Collection Time: 10/17/18  4:36 AM  Result Value Ref Range   WBC 7.7 4.0 - 10.5 K/uL   RBC 2.26 (L) 4.22 - 5.81 MIL/uL   Hemoglobin 6.6 (L) 13.0 - 17.0 g/dL   HCT 20.4 (L) 39.0 - 52.0 %   MCV 90.3 80.0 - 100.0 fL   MCH 29.2 26.0 - 34.0 pg   MCHC 32.4 30.0 - 36.0 g/dL   RDW 13.7 11.5 - 15.5 %   Platelets 167 150 - 400 K/uL   nRBC 0.0 0.0 - 0.2 %    Comment: Performed at [facility], [address] [place], [place] [address]  Magnesium     Status: None   Collection Time: 10/17/18  4:36 AM  Result Value Ref Range   Magnesium 2.0 1.7 - 2.4 mg/dL     Comment: Performed at [facility], [address] [place], [place] [address]  Prepare RBC     Status: None   Collection Time: 10/17/18  5:22 AM  Result Value Ref Range   Order Confirmation      ORDER PROCESSED BY BLOOD BANK Performed at [facility], [address] [place], [place] [address]   CBC     Status: Abnormal   Collection Time: 10/17/18 10:46 AM  Result Value Ref Range   WBC 7.8 4.0 - 10.5 K/uL   RBC 2.93 (L) 4.22 - 5.81 MIL/uL   Hemoglobin 8.8 (L) 13.0 - 17.0 g/dL    Comment: REPEATED TO VERIFY   HCT 26.1 (L) 39.0 - 52.0 %   MCV 89.1 80.0 - 100.0 fL   MCH 30.0 26.0 - 34.0 pg   MCHC 33.7 30.0 - 36.0 g/dL   RDW 13.9 11.5 - 15.5 %   Platelets 175 150 - 400 K/uL   nRBC 0.0 0.0 - 0.2 %    Comment: Performed at [facility], [address] [place], [place] [address]  APTT     Status: None   Collection Time: 10/17/18 10:46 AM  Result Value Ref Range   aPTT 36 24 - 36 seconds    Comment: Performed at [facility], [address] [place], [place] [address]  Protime-INR     Status: None   Collection Time: 10/17/18 10:46 AM  Result Value Ref Range   Prothrombin Time 14.6 11.4 - 15.2 seconds   INR 1.2 0.8 - 1.2    Comment: (NOTE) INR goal varies based on device and disease states. Performed at [facility], [place], [place] [address]   Heparin level (unfractionated)     Status: Abnormal   Collection Time: 10/17/18  7:14 PM  Result Value Ref Range   Heparin Unfractionated 0.10 (L) 0.30 - 0.70 IU/mL    Comment: (NOTE) If heparin results are below expected values, and patient dosage has  been confirmed, suggest follow up testing of antithrombin III levels. Performed at [facility], [address] [place], [place] [address]   CBC     Status: Abnormal   Collection Time: 10/18/18  2:06 AM  Result Value Ref Range   WBC 7.6 4.0 - 10.5 K/uL   RBC 2.71 (L) 4.22 - 5.81 MIL/uL   Hemoglobin  8.1 (L) 13.0 - 17.0 g/dL   HCT 24.4 (L) 39.0 - 52.0 %   MCV 90.0 80.0 - 100.0 fL   MCH 29.9 26.0 - 34.0 pg   MCHC 33.2 30.0 - 36.0 g/dL   RDW 14.1 11.5 - 15.5 %   Platelets 184 150 - 400 K/uL   nRBC 0.0 0.0 - 0.2 %    Comment: Performed at [facility], [place], [place] [address]  Heparin level (unfractionated)     Status: Abnormal   Collection Time: 10/18/18  2:06 AM  Result Value Ref Range   Heparin Unfractionated 0.24 (L) 0.30 - 0.70 IU/mL    Comment: (NOTE) If heparin results are below expected values, and patient dosage has  been confirmed, suggest follow up testing of antithrombin III levels. Performed at [facility], [place], [place] [address]   Heparin level (unfractionated)  Status: None   Collection Time: 10/18/18 11:30 AM  Result Value Ref Range   Heparin Unfractionated 0.30 0.30 - 0.70 IU/mL    Comment: (NOTE) If heparin results are below expected values, and patient dosage has  been confirmed, suggest follow up testing of antithrombin III levels. Performed at [facility], [address] [place], [place] [address]   Hemoglobin     Status: Abnormal   Collection Time: 10/18/18  1:24 PM  Result Value Ref Range   Hemoglobin 8.7 (L) 13.0 - 17.0 g/dL    Comment: Performed at [facility], [address] [place], [place] [address]  Hemoglobin     Status: Abnormal   Collection Time: 10/18/18  5:19 PM  Result Value Ref Range   Hemoglobin 7.6 (L) 13.0 - 17.0 g/dL    Comment: Performed at [facility], [place], [place] [address]  Heparin level (unfractionated)     Status: Abnormal   Collection Time: 10/18/18  5:19 PM  Result Value Ref Range   Heparin Unfractionated 0.21 (L) 0.30 - 0.70 IU/mL    Comment: (NOTE) If heparin results are below expected values, and patient dosage has  been confirmed, suggest follow up testing of antithrombin III levels. Performed at  [facility], [address] [place], [place] [address]   CBC     Status: Abnormal   Collection Time: 10/19/18  4:40 AM  Result Value Ref Range   WBC 5.6 4.0 - 10.5 K/uL   RBC 2.70 (L) 4.22 - 5.81 MIL/uL   Hemoglobin 7.9 (L) 13.0 - 17.0 g/dL   HCT 24.1 (L) 39.0 - 52.0 %   MCV 89.3 80.0 - 100.0 fL   MCH 29.3 26.0 - 34.0 pg   MCHC 32.8 30.0 - 36.0 g/dL   RDW 14.3 11.5 - 15.5 %   Platelets 221 150 - 400 K/uL   nRBC 0.0 0.0 - 0.2 %    Comment: Performed at [facility], [address] [place], [place] [address]  Basic metabolic panel     Status: Abnormal   Collection Time: 10/19/18  4:40 AM  Result Value Ref Range   Sodium 138 135 - 145 mmol/L   Potassium 4.6 3.5 - 5.1 mmol/L   Chloride 111 98 - 111 mmol/L   CO2 20 (L) 22 - 32 mmol/L   Glucose, Bld 91 70 - 99 mg/dL   BUN 27 (H) 8 - 23 mg/dL   Creatinine, Ser 2.36 (H) 0.61 - 1.24 mg/dL   Calcium 8.3 (L) 8.9 - 10.3 mg/dL   GFR calc non Af Amer 29 (L) >60 mL/min   GFR calc Af Amer 33 (L) >60 mL/min   Anion gap 7 5 - 15    Comment: Performed at [facility], [address] [place], [place] [address]     PHQ2/9: Depression screen [facility] 2/9 11/20/2018 10/21/2018 06/22/2018 06/08/2018 03/12/2018  Decreased Interest 0 0 0 0 0  Down, Depressed, Hopeless 0 0 0 0 0  PHQ - 2 Score 0 0 0 0 0  Altered sleeping 0 0 1 1 3   Tired, decreased energy 0 0 0 0 3  Change in appetite 0 0 0 0 0  Feeling bad or failure about yourself  0 0 0 0 0  Trouble concentrating 0 0 0 0 0  Moving slowly or fidgety/restless 0 0 0 0 0  Suicidal thoughts 0 0 0 0 0  PHQ-9 Score 0 0 1 1  6  Difficult doing work/chores - - Not difficult at all Not difficult at all -    phq 9 is negative   Fall Risk: Fall Risk  11/20/2018 10/21/2018 07/06/2018 06/22/2018 06/08/2018  Falls in the past year? 0 0 0 0 0  Number falls in past yr: 0 0 0 0 0  Injury with Fall? 0 0 0 0 0     Functional Status Survey: Is the patient deaf or have  difficulty hearing?: No Does the patient have difficulty seeing, even when wearing glasses/contacts?: No Does the patient have difficulty concentrating, remembering, or making decisions?: No Does the patient have difficulty walking or climbing stairs?: No Does the patient have difficulty dressing or bathing?: No Does the patient have difficulty doing errands alone such as visiting a doctor's office or shopping?: No    Assessment & Plan  1. Hypertension, benign  - lisinopril (ZESTRIL) 10 MG tablet; Take 1 tablet (10 mg total) by mouth 2 (two) times a day.  Dispense: 180 tablet; Refill: 0  BP was elevated on second check , advised to check bp at home and take lisinopril 10 mg BID  2. Chronic obstructive pulmonary disease, unspecified COPD type ([place])  - Fluticasone-Umeclidin-Vilant (TRELEGY ELLIPTA) 100-62.5-25 MCG/INH AEPB; Inhale 1 puff into the lungs daily.  Dispense: 60 each; Refill: 2  3. PAD (peripheral artery disease) (HCC)  - Lipid panel  4. B12 deficiency   5. Vitamin B1 deficiency  - Vitamin B1  6. Benign hypertension with chronic kidney disease, stage III (HCC)  - COMPLETE METABOLIC PANEL WITH GFR  7. Stage 3 chronic kidney disease (HCC)  - COMPLETE METABOLIC PANEL WITH GFR  8. Anemia, unspecified type  - CBC with Differential/Platelet - Iron, TIBC and Ferritin Panel  9. Alcoholism /alcohol abuse ([place])  Needs to quit drinking but not ready, advised to cut down

## 2018-11-21 LAB — COMPLETE METABOLIC PANEL WITH GFR
AG Ratio: 1.4 (calc) (ref 1.0–2.5)
ALT: 13 U/L (ref 9–46)
AST: 21 U/L (ref 10–35)
Albumin: 4.2 g/dL (ref 3.6–5.1)
Alkaline phosphatase (APISO): 71 U/L (ref 35–144)
BUN/Creatinine Ratio: 14 (calc) (ref 6–22)
BUN: 24 mg/dL (ref 7–25)
CO2: 22 mmol/L (ref 20–32)
Calcium: 8.9 mg/dL (ref 8.6–10.3)
Chloride: 109 mmol/L (ref 98–110)
Creat: 1.67 mg/dL — ABNORMAL HIGH (ref 0.70–1.25)
GFR, Est African American: 50 mL/min/{1.73_m2} — ABNORMAL LOW (ref >=60)
GFR, Est Non African American: 44 mL/min/{1.73_m2} — ABNORMAL LOW (ref >=60)
Globulin: 3 g/dL (calc) (ref 1.9–3.7)
Glucose, Bld: 78 mg/dL (ref 65–99)
Potassium: 5.5 mmol/L — ABNORMAL HIGH (ref 3.5–5.3)
Sodium: 138 mmol/L (ref 135–146)
Total Bilirubin: 0.3 mg/dL (ref 0.2–1.2)
Total Protein: 7.2 g/dL (ref 6.1–8.1)

## 2018-11-21 LAB — CBC WITH DIFFERENTIAL/PLATELET
Absolute Monocytes: 471 cells/uL (ref 200–950)
Basophils Absolute: 37 cells/uL (ref 0–200)
Basophils Relative: 0.6 %
Eosinophils Absolute: 112 cells/uL (ref 15–500)
Eosinophils Relative: 1.8 %
HCT: 30.6 % — ABNORMAL LOW (ref 38.5–50.0)
Hemoglobin: 9.8 g/dL — ABNORMAL LOW (ref 13.2–17.1)
Lymphs Abs: 1376 cells/uL (ref 850–3900)
MCH: 28.2 pg (ref 27.0–33.0)
MCHC: 32 g/dL (ref 32.0–36.0)
MCV: 88.2 fL (ref 80.0–100.0)
MPV: 10.4 fL (ref 7.5–12.5)
Monocytes Relative: 7.6 %
Neutro Abs: 4204 cells/uL (ref 1500–7800)
Neutrophils Relative %: 67.8 %
Platelets: 248 10*3/uL (ref 140–400)
RBC: 3.47 10*6/uL — ABNORMAL LOW (ref 4.20–5.80)
RDW: 13.2 % (ref 11.0–15.0)
Total Lymphocyte: 22.2 %
WBC: 6.2 10*3/uL (ref 3.8–10.8)

## 2018-11-21 LAB — IRON,TIBC AND FERRITIN PANEL
%SAT: 5 % (calc) — ABNORMAL LOW (ref 20–48)
Ferritin: 9 ng/mL — ABNORMAL LOW (ref 24–380)
Iron: 18 ug/dL — ABNORMAL LOW (ref 50–180)
TIBC: 374 mcg/dL (calc) (ref 250–425)

## 2018-11-21 LAB — LIPID PANEL
Cholesterol: 174 mg/dL (ref <200)
HDL: 92 mg/dL (ref >=40)
LDL Cholesterol (Calc): 71 mg/dL (calc)
Non-HDL Cholesterol (Calc): 82 mg/dL (calc) (ref <130)
Total CHOL/HDL Ratio: 1.9 (calc) (ref <5.0)
Triglycerides: 39 mg/dL (ref <150)

## 2018-11-21 LAB — VITAMIN B1

## 2018-11-23 ENCOUNTER — Encounter: Payer: Self-pay | Admitting: Family Medicine

## 2018-11-23 DIAGNOSIS — D509 Iron deficiency anemia, unspecified: Secondary | ICD-10-CM | POA: Insufficient documentation

## 2018-11-24 ENCOUNTER — Telehealth: Payer: Self-pay | Admitting: Family Medicine

## 2018-11-24 ENCOUNTER — Encounter (INDEPENDENT_AMBULATORY_CARE_PROVIDER_SITE_OTHER): Payer: BLUE CROSS/BLUE SHIELD

## 2018-11-24 ENCOUNTER — Ambulatory Visit (INDEPENDENT_AMBULATORY_CARE_PROVIDER_SITE_OTHER): Payer: BLUE CROSS/BLUE SHIELD | Admitting: Nurse Practitioner

## 2018-11-24 NOTE — Telephone Encounter (Signed)
Pt. Given results and instructions. Verbalizes understanding. 

## 2018-11-27 ENCOUNTER — Other Ambulatory Visit: Payer: Self-pay

## 2018-11-27 ENCOUNTER — Encounter: Payer: Self-pay | Admitting: Vascular Surgery

## 2018-11-27 ENCOUNTER — Other Ambulatory Visit: Payer: Self-pay | Admitting: Family Medicine

## 2018-11-27 ENCOUNTER — Ambulatory Visit (INDEPENDENT_AMBULATORY_CARE_PROVIDER_SITE_OTHER): Payer: BC Managed Care – PPO | Admitting: Family Medicine

## 2018-11-27 ENCOUNTER — Ambulatory Visit (INDEPENDENT_AMBULATORY_CARE_PROVIDER_SITE_OTHER): Payer: BC Managed Care – PPO | Admitting: Vascular Surgery

## 2018-11-27 VITALS — BP 156/60 | HR 77 | Temp 97.6°F | Resp 20 | Ht 68.0 in | Wt 133.0 lb

## 2018-11-27 VITALS — BP 150/90 | HR 86

## 2018-11-27 DIAGNOSIS — E519 Thiamine deficiency, unspecified: Secondary | ICD-10-CM

## 2018-11-27 DIAGNOSIS — I739 Peripheral vascular disease, unspecified: Secondary | ICD-10-CM | POA: Diagnosis not present

## 2018-11-27 DIAGNOSIS — I10 Essential (primary) hypertension: Secondary | ICD-10-CM

## 2018-11-27 MED ORDER — HYDROCHLOROTHIAZIDE 12.5 MG PO TABS: 12.5000 mg | ORAL_TABLET | Freq: Every day | ORAL | 0 refills | Status: DC

## 2018-11-27 NOTE — Progress Notes (Signed)
Patient ID: Ryan [name], male   DOB: 01-Jun-1956, 63 y.o.   MRN: [id number]  Reason for Consult: New Patient (Initial Visit)   Referred by Steele Sizer, MD  Subjective:     HPI:  Ryan [name] is a 63 y.o. male is undergone multiple right lower extremity endovascular interventions including stenting and drug-coated balloon angioplasty of his SFA posterior tibial artery for rest pain.  At this time he was noted to have ABIs of greater than 1 bilaterally.  Does not have any further rest pain no claudication symptoms at this time.  Does have some tingling in his right foot that he has been advised will hopefully resolve over time.  He continues to work.  Currently taking Eliquis and aspirin daily.  He presents today for second opinion.  Past Medical History:  Diagnosis Date  . Hypertension   . Neuromuscular disorder (HCC)    numbness feet  . Peripheral vascular disease ([place])   . Shortness of breath dyspnea    Family History  Problem Relation Age of Onset  . COPD Mother   . Hypertension Father   . Heart attack Brother    Past Surgical History:  Procedure Laterality Date  . COLONOSCOPY WITH PROPOFOL N/A 02/15/2016   Procedure: COLONOSCOPY WITH PROPOFOL;  Surgeon: Lucilla Lame, MD;  Location: [place];  Service: Endoscopy;  Laterality: N/A;  . HERNIA REPAIR  1999   left inguinal  . LOWER EXTREMITY ANGIOGRAPHY Right 05/07/2018   Procedure: LOWER EXTREMITY ANGIOGRAPHY;  Surgeon: Algernon Huxley, MD;  Location: [place] [facility];  Service: Cardiovascular;  Laterality: Right;  . LOWER EXTREMITY ANGIOGRAPHY Right 06/25/2018   Procedure: LOWER EXTREMITY ANGIOGRAPHY;  Surgeon: Algernon Huxley, MD;  Location: [place] [facility];  Service: Cardiovascular;  Laterality: Right;  . LOWER EXTREMITY ANGIOGRAPHY Right 07/01/2018   Procedure: LOWER EXTREMITY ANGIOGRAPHY;  Surgeon: Algernon Huxley, MD;  Location: [place] [facility];  Service: Cardiovascular;  Laterality: Right;  . LOWER  EXTREMITY ANGIOGRAPHY Right 08/12/2018   Procedure: LOWER EXTREMITY ANGIOGRAPHY;  Surgeon: Algernon Huxley, MD;  Location: [place] [facility];  Service: Cardiovascular;  Laterality: Right;  . LOWER EXTREMITY ANGIOGRAPHY Right 09/03/2018   Procedure: LOWER EXTREMITY ANGIOGRAPHY;  Surgeon: Algernon Huxley, MD;  Location: [place] [facility];  Service: Cardiovascular;  Laterality: Right;  . LOWER EXTREMITY ANGIOGRAPHY Right 09/04/2018   Procedure: Lower Extremity Angiography;  Surgeon: Algernon Huxley, MD;  Location: [place] [facility];  Service: Cardiovascular;  Laterality: Right;  . LOWER EXTREMITY ANGIOGRAPHY Right 10/01/2018   Procedure: LOWER EXTREMITY ANGIOGRAPHY;  Surgeon: Algernon Huxley, MD;  Location: [place] [facility];  Service: Cardiovascular;  Laterality: Right;  . LOWER EXTREMITY ANGIOGRAPHY Right 10/01/2018   Procedure: Lower Extremity Angiography;  Surgeon: Algernon Huxley, MD;  Location: [place] [facility];  Service: Cardiovascular;  Laterality: Right;  . LOWER EXTREMITY ANGIOGRAPHY Right 10/15/2018   Procedure: LOWER EXTREMITY ANGIOGRAPHY;  Surgeon: Algernon Huxley, MD;  Location: [place] [facility];  Service: Cardiovascular;  Laterality: Right;  . LOWER EXTREMITY ANGIOGRAPHY Left 10/16/2018   Procedure: Lower Extremity Angiography;  Surgeon: Algernon Huxley, MD;  Location: [place] [facility];  Service: Cardiovascular;  Laterality: Left;  . LOWER EXTREMITY INTERVENTION Right 07/02/2018   Procedure: LOWER EXTREMITY INTERVENTION;  Surgeon: Algernon Huxley, MD;  Location: [name] [facility];  Service: Cardiovascular;  Laterality: Right;  . POLYPECTOMY N/A 02/15/2016   Procedure: POLYPECTOMY;  Surgeon:  Lucilla Lame, MD;  Location: [place];  Service: Endoscopy;  Laterality: N/A;    Short Social History:  Social History   Tobacco Use  . Smoking status: Former Smoker    Packs/day: 0.10    Years: 40.00    Pack years: 4.00    Types: Cigarettes    Start date: 07/21/1978    Last attempt to  quit: 08/28/2018    Years since quitting: 0.2  . Smokeless tobacco: Never Used  Substance Use Topics  . Alcohol use: Yes    Alcohol/week: 12.0 standard drinks    Types: 12 Cans of beer per week    No Known Allergies  Current Outpatient Medications  Medication Sig Dispense Refill  . apixaban (ELIQUIS) 5 MG TABS tablet Take 1 tablet (5 mg total) by mouth 2 (two) times daily. 180 tablet 3  . aspirin EC 81 MG tablet Take 1 tablet (81 mg total) by mouth daily. 90 tablet 3  . atorvastatin (LIPITOR) 20 MG tablet Take 1 tablet (20 mg total) by mouth daily. 90 tablet 3  . Cyanocobalamin (VITAMIN B-12) 1000 MCG SUBL Place 1 tablet (1,000 mcg total) under the tongue daily. 90 tablet 0  . Fluticasone-Umeclidin-Vilant (TRELEGY ELLIPTA) 100-62.5-25 MCG/INH AEPB Inhale 1 puff into the lungs daily. 60 each 2  . HYDROcodone-acetaminophen (NORCO) 5-325 MG tablet Take 1-2 tablets by mouth every 6 (six) hours as needed for moderate pain or severe pain. 28 tablet 0  . lisinopril (ZESTRIL) 10 MG tablet Take 1 tablet (10 mg total) by mouth 2 (two) times a day. 180 tablet 0  . nicotine (NICODERM CQ - DOSED IN MG/24 HOURS) 21 mg/24hr patch Place 1 patch (21 mg total) onto the skin daily. 28 patch 1  . thiamine (VITAMIN B-1) 50 MG tablet Take 1 tablet (50 mg total) by mouth daily. 30 tablet 2   No current facility-administered medications for this visit.     Review of Systems  Constitutional:  Constitutional negative. HENT: HENT negative.  Eyes: Eyes negative.  Respiratory: Respiratory negative.  Cardiovascular: Cardiovascular negative.  GI: Gastrointestinal negative.  Musculoskeletal: Musculoskeletal negative.  Skin: Skin negative.  Neurological: Neurological negative. Hematologic: Hematologic/lymphatic negative.  Psychiatric: Psychiatric negative.        Objective:  Objective   Vitals:   11/27/18 1115  BP: 106/60  Pulse: 77  Resp: 20  Temp: 97.6 F (36.4 C)  SpO2: 99%  Weight: 133 lb (60.3  kg)  Height: 5\' 8"  (1.727 m)   Body mass index is 20.22 kg/m.  Physical Exam Constitutional:      Appearance: Normal appearance.  HENT:     Head: Normocephalic.     Right Ear: Tympanic membrane normal.     Nose: Nose normal.     Mouth/Throat:     Mouth: Mucous membranes are moist.  Eyes:     Pupils: Pupils are equal, round, and reactive to light.  Neck:     Musculoskeletal: Normal range of motion and neck supple.  Cardiovascular:     Rate and Rhythm: Normal rate and regular rhythm.     Pulses:          Popliteal pulses are 2+ on the right side and 2+ on the left side.  Pulmonary:     Effort: Pulmonary effort is normal.     Breath sounds: Normal breath sounds.  Abdominal:     General: Abdomen is flat.     Palpations: Abdomen is soft.  Musculoskeletal: Normal range of motion.  General: No swelling.  Skin:    General: Skin is warm and dry.     Capillary Refill: Capillary refill takes less than 2 seconds.  Neurological:     General: No focal deficit present.     Mental Status: He is alert and oriented to person, place, and time.  Psychiatric:        Mood and Affect: Mood normal.        Behavior: Behavior normal.        Thought Content: Thought content normal.        Judgment: Judgment normal.     Data: I reviewed his recent ABIs which are greater than 1 bilaterally     Assessment/Plan:     63 year old male presents for second opinion after multiple right lower extremity endovascular interventions.  I discussed with him that if he has further issues with his stents he will likely be facing bypass surgery which at his young age he should be able to tolerate well also has what appears to be greater saphenous vein in his right thigh that could be suitable for bypass.  I assured him that all of his procedures have been performed within the standard of care and he is certainly had a good result given that he is currently asymptomatic.  He can see me on an as-needed  basis.     Waynetta Sandy MD [facility]

## 2018-11-27 NOTE — Progress Notes (Addendum)
Patient is here for a blood pressure check. Patient denies chest pain, palpitations, shortness of breath or visual disturbances. At previous visit blood pressure was 180/94 with a heart rate of 80. Today during nurse visit first check blood pressure was 156/86. After resting for 10 minutes it was 150/90 and heart rate was 86. He does take lisinopril 10 mg and Dr. Ancil Boozer will add HCTZ 12.5 mg daily

## 2018-12-02 LAB — VITAMIN B1: Vitamin B1 (Thiamine): 19 nmol/L (ref 8–30)

## 2018-12-07 ENCOUNTER — Other Ambulatory Visit: Payer: Self-pay

## 2018-12-07 ENCOUNTER — Ambulatory Visit: Payer: BC Managed Care – PPO

## 2018-12-07 VITALS — BP 180/100 | HR 82

## 2018-12-07 DIAGNOSIS — I10 Essential (primary) hypertension: Secondary | ICD-10-CM

## 2018-12-07 MED ORDER — AMLODIPINE BESYLATE 2.5 MG PO TABS: 2.5000 mg | ORAL_TABLET | Freq: Every day | ORAL | 0 refills | Status: DC

## 2018-12-07 NOTE — Progress Notes (Signed)
Patient is here for a blood pressure check. Patient denies chest pain, palpitations, shortness of breath or visual disturbances. At previous visit blood pressure was 150/90 with a heart rate of 86. Today during nurse visit first check blood pressure was 192/98. After resting for 10 minutes it was 180/100 and heart rate was 82. He does take any blood pressure medications.  Dr. Ancil Boozer added Amlodipine 2.5 mg once daily to his regimen of Lisinopril 10 mg bid and HCTZ 12.5 mg daily. Come back by the end of the week to recheck BP.

## 2018-12-16 ENCOUNTER — Encounter (INDEPENDENT_AMBULATORY_CARE_PROVIDER_SITE_OTHER): Payer: BLUE CROSS/BLUE SHIELD

## 2018-12-16 ENCOUNTER — Ambulatory Visit (INDEPENDENT_AMBULATORY_CARE_PROVIDER_SITE_OTHER): Payer: BLUE CROSS/BLUE SHIELD | Admitting: Nurse Practitioner

## 2019-01-18 ENCOUNTER — Encounter (INDEPENDENT_AMBULATORY_CARE_PROVIDER_SITE_OTHER): Payer: Self-pay | Admitting: Nurse Practitioner

## 2019-01-18 ENCOUNTER — Telehealth (INDEPENDENT_AMBULATORY_CARE_PROVIDER_SITE_OTHER): Payer: Self-pay

## 2019-01-18 ENCOUNTER — Encounter (INDEPENDENT_AMBULATORY_CARE_PROVIDER_SITE_OTHER): Payer: Self-pay

## 2019-01-18 ENCOUNTER — Telehealth (INDEPENDENT_AMBULATORY_CARE_PROVIDER_SITE_OTHER): Payer: Self-pay | Admitting: Nurse Practitioner

## 2019-01-18 ENCOUNTER — Ambulatory Visit (INDEPENDENT_AMBULATORY_CARE_PROVIDER_SITE_OTHER): Payer: BC Managed Care – PPO | Admitting: Nurse Practitioner

## 2019-01-18 ENCOUNTER — Other Ambulatory Visit: Payer: Self-pay

## 2019-01-18 ENCOUNTER — Ambulatory Visit (INDEPENDENT_AMBULATORY_CARE_PROVIDER_SITE_OTHER): Payer: BC Managed Care – PPO

## 2019-01-18 ENCOUNTER — Other Ambulatory Visit
Admission: RE | Admit: 2019-01-18 | Discharge: 2019-01-18 | Disposition: A | Discharge status: Home / Self Care | Payer: BC Managed Care – PPO | Source: Ambulatory Visit | Attending: Vascular Surgery | Admitting: Vascular Surgery

## 2019-01-18 VITALS — BP 144/85 | HR 69 | Resp 16 | Ht 68.0 in | Wt 134.0 lb

## 2019-01-18 DIAGNOSIS — I70221 Atherosclerosis of native arteries of extremities with rest pain, right leg: Secondary | ICD-10-CM

## 2019-01-18 DIAGNOSIS — I739 Peripheral vascular disease, unspecified: Secondary | ICD-10-CM

## 2019-01-18 DIAGNOSIS — Z20828 Contact with and (suspected) exposure to other viral communicable diseases: Secondary | ICD-10-CM | POA: Insufficient documentation

## 2019-01-18 DIAGNOSIS — Z79899 Other long term (current) drug therapy: Secondary | ICD-10-CM | POA: Diagnosis not present

## 2019-01-18 DIAGNOSIS — I10 Essential (primary) hypertension: Secondary | ICD-10-CM

## 2019-01-18 DIAGNOSIS — Z716 Tobacco abuse counseling: Secondary | ICD-10-CM

## 2019-01-18 NOTE — Telephone Encounter (Signed)
Patient was seen today and scheduled for a procedure with Dr. Lucky Cowboy for 01/20/2019 with a arrival of 11:30 am to MM. Patient will do Covid testing today at the [place], patient has been given pre-procedure instructions as well.

## 2019-01-18 NOTE — Telephone Encounter (Signed)
Try to see if we can get him in today.  Tomorrow at the latest. Will need ABI

## 2019-01-18 NOTE — Telephone Encounter (Signed)
Patient was last seen on 11/05/2018, please advise.

## 2019-01-18 NOTE — Progress Notes (Signed)
SUBJECTIVE:  Patient ID: CURLY MACKOWSKI, male    DOB: 1956/02/18, 63 y.o.   MRN: [id number] Chief Complaint  Patient presents with  . Follow-up    add on abi right leg pain    HPI  SAMEER TEEPLE is a 63 y.o. male with a longstanding history of peripheral artery disease.  The patient contacted our office today to let us know that he began to have sudden severe pain in his right lower extremity that was different from his normal claudication that he has.  The patient states that it is mostly painful whenever he is walking or if he tries to elevate his lower extremity.  He states that dangling his foot off the edge of the bed or maintaining a sitting position it feels best.  He denies any ulcerations or wounds.  He denies any loss of motor function at this time.  The patient underwent noninvasive studies which shows an ABI of the right lower extremity is 0.31 and the left lower extremity at 1.13.  His previous ABIs done on 11/05/2018 were 1.03 on the right and 1.19 on the left.  There was no flow detected within the right anterior tibial and peroneal arteries.  There is monophasic flow within the posterior tibial artery.  The right digits have severely dampened waveforms with no flow detected in the right great digit.  The left lower extremity has no flow detected in the anterior tibial artery.  The posterior tibial and peroneal arteries all have biphasic waveforms with strong left great toe waveforms.  Past Medical History:  Diagnosis Date  . Hypertension   . Neuromuscular disorder (HCC)    numbness feet  . Peripheral vascular disease ([place])   . Shortness of breath dyspnea     Past Surgical History:  Procedure Laterality Date  . COLONOSCOPY WITH PROPOFOL N/A 02/15/2016   Procedure: COLONOSCOPY WITH PROPOFOL;  Surgeon: Lucilla Lame, MD;  Location: [facility];  Service: Endoscopy;  Laterality: N/A;  . HERNIA REPAIR  1999   left inguinal  . LOWER EXTREMITY ANGIOGRAPHY Right  05/07/2018   Procedure: LOWER EXTREMITY ANGIOGRAPHY;  Surgeon: Algernon Huxley, MD;  Location: [facility];  Service: Cardiovascular;  Laterality: Right;  . LOWER EXTREMITY ANGIOGRAPHY Right 06/25/2018   Procedure: LOWER EXTREMITY ANGIOGRAPHY;  Surgeon: Algernon Huxley, MD;  Location: [facility];  Service: Cardiovascular;  Laterality: Right;  . LOWER EXTREMITY ANGIOGRAPHY Right 07/01/2018   Procedure: LOWER EXTREMITY ANGIOGRAPHY;  Surgeon: Algernon Huxley, MD;  Location: [facility];  Service: Cardiovascular;  Laterality: Right;  . LOWER EXTREMITY ANGIOGRAPHY Right 08/12/2018   Procedure: LOWER EXTREMITY ANGIOGRAPHY;  Surgeon: Algernon Huxley, MD;  Location: [facility];  Service: Cardiovascular;  Laterality: Right;  . LOWER EXTREMITY ANGIOGRAPHY Right 09/03/2018   Procedure: LOWER EXTREMITY ANGIOGRAPHY;  Surgeon: Algernon Huxley, MD;  Location: [facility];  Service: Cardiovascular;  Laterality: Right;  . LOWER EXTREMITY ANGIOGRAPHY Right 09/04/2018   Procedure: Lower Extremity Angiography;  Surgeon: Algernon Huxley, MD;  Location: [facility];  Service: Cardiovascular;  Laterality: Right;  . LOWER EXTREMITY ANGIOGRAPHY Right 10/01/2018   Procedure: LOWER EXTREMITY ANGIOGRAPHY;  Surgeon: Algernon Huxley, MD;  Location: [facility];  Service: Cardiovascular;  Laterality: Right;  . LOWER EXTREMITY ANGIOGRAPHY Right 10/01/2018   Procedure: Lower Extremity Angiography;  Surgeon: Algernon Huxley, MD;  Location: [facility];  Service: Cardiovascular;  Laterality: Right;  .  LOWER EXTREMITY ANGIOGRAPHY Right 10/15/2018   Procedure: LOWER EXTREMITY ANGIOGRAPHY;  Surgeon: Algernon Huxley, MD;  Location: [facility];  Service: Cardiovascular;  Laterality: Right;  . LOWER EXTREMITY ANGIOGRAPHY Left 10/16/2018   Procedure: Lower Extremity Angiography;  Surgeon: Algernon Huxley, MD;  Location: [facility];  Service: Cardiovascular;  Laterality: Left;  . LOWER  EXTREMITY INTERVENTION Right 07/02/2018   Procedure: LOWER EXTREMITY INTERVENTION;  Surgeon: Algernon Huxley, MD;  Location: [facility];  Service: Cardiovascular;  Laterality: Right;  . POLYPECTOMY N/A 02/15/2016   Procedure: POLYPECTOMY;  Surgeon: Lucilla Lame, MD;  Location: [facility];  Service: Endoscopy;  Laterality: N/A;    Social History   Socioeconomic History  . Marital status: Legally Separated    Spouse name: [name]  . Number of children: 5  . Years of education: Not on file  . Highest education level: High school graduate  Occupational History  . Occupation: Forklift    Comment: [name]  Social Needs  . Financial resource strain: Not hard at all  . Food insecurity    Worry: Never true    Inability: Never true  . Transportation needs    Medical: No    Non-medical: No  Tobacco Use  . Smoking status: Former Smoker    Packs/day: 0.10    Years: 40.00    Pack years: 4.00    Types: Cigarettes    Start date: 07/21/1978    Quit date: 08/28/2018    Years since quitting: 0.3  . Smokeless tobacco: Never Used  Substance and Sexual Activity  . Alcohol use: Yes    Alcohol/week: 12.0 standard drinks    Types: 12 Cans of beer per week  . Drug use: Never  . Sexual activity: Yes    Partners: Female    Birth control/protection: None  Lifestyle  . Physical activity    Days per week: 6 days    Minutes per session: 60 min  . Stress: Not at all  Relationships  . Social Herbalist on phone: Three times a week    Gets together: Twice a week    Attends religious service: Never    Active member of club or organization: No    Attends meetings of clubs or organizations: Never    Relationship status: Married  . Intimate partner violence    Fear of current or ex partner: No    Emotionally abused: No    Physically abused: No    Forced sexual activity: No  Other Topics Concern  . Not on file  Social History Narrative  . Not on file    Family History   Problem Relation Age of Onset  . COPD Mother   . Hypertension Father   . Heart attack Brother     No Known Allergies   Review of Systems   Review of Systems: Negative Unless Checked Constitutional: [] Weight loss  [] Fever  [] Chills Cardiac: [] Chest pain   []  Atrial Fibrillation  [] Palpitations   [] Shortness of breath when laying flat   [] Shortness of breath with exertion. [] Shortness of breath at rest Vascular:  [] Pain in legs with walking   [] Pain in legs with standing [x] Pain in legs when laying flat   [x] Claudication    [x] Pain in feet when laying flat    [] History of DVT   [] Phlebitis   [x] Swelling in legs   [] Varicose veins   [] Non-healing ulcers Pulmonary:   [] Uses home oxygen   []   Productive cough   [] Hemoptysis   [] Wheeze  [x] COPD   [] Asthma Neurologic:  [] Dizziness   [] Seizures  [] Blackouts [] History of stroke   [] History of TIA  [] Aphasia   [] Temporary Blindness   [] Weakness or numbness in arm   [] Weakness or numbness in leg Musculoskeletal:   [] Joint swelling   [] Joint pain   [] Low back pain  []  History of Knee Replacement [] Arthritis [] back Surgeries  []  Spinal Stenosis    Hematologic:  [] Easy bruising  [] Easy bleeding   [] Hypercoagulable state   [] Anemic Gastrointestinal:  [] Diarrhea   [] Vomiting  [] Gastroesophageal reflux/heartburn   [] Difficulty swallowing. [] Abdominal pain Genitourinary:  [] Chronic kidney disease   [] Difficult urination  [] Anuric   [] Blood in urine [] Frequent urination  [] Burning with urination   [] Hematuria Skin:  [] Rashes   [] Ulcers [] Wounds Psychological:  [] History of anxiety   []  History of major depression  []  Memory Difficulties      OBJECTIVE:   Physical Exam  BP (!) 144/85 (BP Location: Right Arm)   Pulse 69   Resp 16   Ht 5\' 8"  (1.727 m)   Wt 134 lb (60.8 kg)   BMI 20.37 kg/m   Gen: WD/WN, NAD Head: [place]/AT, No temporalis wasting.  Ear/Nose/Throat: Hearing grossly intact, nares w/o erythema or drainage Eyes: PER, EOMI, sclera  nonicteric.  Neck: Supple, no masses.  No JVD.  Pulmonary:  Good air movement, no use of accessory muscles.  Cardiac: RRR Vascular: Cool foot but no evidence of mottling, motor dysfunction Vessel Right Left  Radial Palpable Palpable  Dorsalis Pedis Not Palpable Not Palpable  Posterior Tibial Not Palpable Palpable   Gastrointestinal: soft, non-distended. No guarding/no peritoneal signs.  Musculoskeletal: M/S 5/5 throughout.  No deformity or atrophy.  Neurologic: Pain and light touch intact in extremities.  Symmetrical.  Speech is fluent. Motor exam as listed above. Psychiatric: Judgment intact, Mood & affect appropriate for pt's clinical situation. Dermatologic: No Venous rashes. No Ulcers Noted.  No changes consistent with cellulitis. Lymph : No Cervical lymphadenopathy, no lichenification or skin changes of chronic lymphedema.       ASSESSMENT AND PLAN:  1. Hypertension, benign Continue antihypertensive medications as already ordered, these medications have been reviewed and there are no changes at this time.   2. Atherosclerosis of native artery of right lower extremity with rest pain ([place]) The patient underwent noninvasive studies which shows an ABI of the right lower extremity is 0.31 and the left lower extremity at 1.13.  His previous ABIs done on 11/05/2018 were 1.03 on the right and 1.19 on the left.  There was no flow detected within the right anterior tibial and peroneal arteries.  There is monophasic flow within the posterior tibial artery.  The right digits have severely dampened waveforms with no flow detected in the right great digit.  The left lower extremity has no flow detected in the anterior tibial artery.  The posterior tibial and peroneal arteries all have biphasic waveforms with strong left great toe waveforms.  Recommend:  The patient has evidence of severe atherosclerotic changes of both lower extremities with rest pain that is associated with preulcerative changes  and impending tissue loss of the foot.  This represents a limb threatening ischemia and places the patient at the risk for limb loss.  Patient should undergo angiography of the lower extremities with the hope for intervention for limb salvage.  The risks and benefits as well as the alternative therapies was discussed in detail with the patient.  All  questions were answered.  Patient agrees to proceed with angiography.  The patient will follow up with me in the office after the procedure.       3. Encounter for tobacco use cessation counseling Patient continues to abstain from cigarette smoking.  Abstinence is greatly encouraged.   Current Outpatient Medications on File Prior to Visit  Medication Sig Dispense Refill  . amLODipine (NORVASC) 2.5 MG tablet Take 1 tablet (2.5 mg total) by mouth daily. 30 tablet 0  . apixaban (ELIQUIS) 5 MG TABS tablet Take 1 tablet (5 mg total) by mouth 2 (two) times daily. 180 tablet 3  . aspirin EC 81 MG tablet Take 1 tablet (81 mg total) by mouth daily. 90 tablet 3  . atorvastatin (LIPITOR) 20 MG tablet Take 1 tablet (20 mg total) by mouth daily. 90 tablet 3  . Cyanocobalamin (VITAMIN B-12) 1000 MCG SUBL Place 1 tablet (1,000 mcg total) under the tongue daily. 90 tablet 0  . Fluticasone-Umeclidin-Vilant (TRELEGY ELLIPTA) 100-62.5-25 MCG/INH AEPB Inhale 1 puff into the lungs daily. 60 each 2  . hydrochlorothiazide (HYDRODIURIL) 12.5 MG tablet Take 1 tablet (12.5 mg total) by mouth daily. 30 tablet 0  . HYDROcodone-acetaminophen (NORCO) 5-325 MG tablet Take 1-2 tablets by mouth every 6 (six) hours as needed for moderate pain or severe pain. 28 tablet 0  . lisinopril (ZESTRIL) 10 MG tablet Take 1 tablet (10 mg total) by mouth 2 (two) times a day. 180 tablet 0  . nicotine (NICODERM CQ - DOSED IN MG/24 HOURS) 21 mg/24hr patch Place 1 patch (21 mg total) onto the skin daily. 28 patch 1  . thiamine (VITAMIN B-1) 50 MG tablet Take 1 tablet (50 mg total) by mouth  daily. 30 tablet 2   No current facility-administered medications on file prior to visit.     There are no Patient Instructions on file for this visit. No follow-ups on file.   Kris Hartmann, NP  This note was completed with Sales executive.  Any errors are purely unintentional.

## 2019-01-18 NOTE — Telephone Encounter (Signed)
See notes below

## 2019-01-19 LAB — SARS CORONAVIRUS 2 (TAT 6-24 HRS): SARS Coronavirus 2: NEGATIVE

## 2019-01-19 MED ORDER — CEFAZOLIN SODIUM-DEXTROSE 2-4 GM/100ML-% IV SOLN
2.0000 g | Freq: Once | INTRAVENOUS | Status: AC
  Administered 2019-01-20: 2 g via INTRAVENOUS

## 2019-01-20 ENCOUNTER — Encounter
Admission: AD | Disposition: A | Discharge status: Home / Self Care | Payer: Self-pay | Source: Ambulatory Visit | Attending: Vascular Surgery

## 2019-01-20 ENCOUNTER — Encounter: Payer: Self-pay | Admitting: *Deleted

## 2019-01-20 ENCOUNTER — Other Ambulatory Visit (INDEPENDENT_AMBULATORY_CARE_PROVIDER_SITE_OTHER): Payer: Self-pay | Admitting: Vascular Surgery

## 2019-01-20 ENCOUNTER — Other Ambulatory Visit: Payer: Self-pay

## 2019-01-20 ENCOUNTER — Inpatient Hospital Stay
Admission: AD | Admit: 2019-01-20 | Discharge: 2019-01-23 | DRG: 272 | Disposition: A | Discharge status: Home / Self Care | Payer: BC Managed Care – PPO | Source: Ambulatory Visit | Attending: Vascular Surgery | Admitting: Vascular Surgery

## 2019-01-20 DIAGNOSIS — T82856A Stenosis of peripheral vascular stent, initial encounter: Secondary | ICD-10-CM | POA: Diagnosis not present

## 2019-01-20 DIAGNOSIS — Z7901 Long term (current) use of anticoagulants: Secondary | ICD-10-CM | POA: Diagnosis not present

## 2019-01-20 DIAGNOSIS — E785 Hyperlipidemia, unspecified: Secondary | ICD-10-CM | POA: Diagnosis present

## 2019-01-20 DIAGNOSIS — Z7982 Long term (current) use of aspirin: Secondary | ICD-10-CM

## 2019-01-20 DIAGNOSIS — I10 Essential (primary) hypertension: Secondary | ICD-10-CM | POA: Diagnosis present

## 2019-01-20 DIAGNOSIS — Z79899 Other long term (current) drug therapy: Secondary | ICD-10-CM

## 2019-01-20 DIAGNOSIS — I70221 Atherosclerosis of native arteries of extremities with rest pain, right leg: Secondary | ICD-10-CM | POA: Diagnosis not present

## 2019-01-20 DIAGNOSIS — I998 Other disorder of circulatory system: Secondary | ICD-10-CM | POA: Diagnosis not present

## 2019-01-20 DIAGNOSIS — I70229 Atherosclerosis of native arteries of extremities with rest pain, unspecified extremity: Secondary | ICD-10-CM

## 2019-01-20 DIAGNOSIS — Z7951 Long term (current) use of inhaled steroids: Secondary | ICD-10-CM

## 2019-01-20 HISTORY — PX: LOWER EXTREMITY ANGIOGRAPHY: CATH118251

## 2019-01-20 HISTORY — DX: Hyperlipidemia, unspecified: E78.5

## 2019-01-20 LAB — CBC
HCT: 35.1 % — ABNORMAL LOW (ref 39.0–52.0)
HCT: 33.6 % — ABNORMAL LOW (ref 39.0–52.0)
Hemoglobin: 11.5 g/dL — ABNORMAL LOW (ref 13.0–17.0)
Hemoglobin: 11.1 g/dL — ABNORMAL LOW (ref 13.0–17.0)
MCH: 30 pg (ref 26.0–34.0)
MCH: 30.2 pg (ref 26.0–34.0)
MCHC: 32.8 g/dL (ref 30.0–36.0)
MCHC: 33 g/dL (ref 30.0–36.0)
MCV: 91.6 fL (ref 80.0–100.0)
MCV: 91.6 fL (ref 80.0–100.0)
Platelets: 201 10*3/uL (ref 150–400)
Platelets: 186 10*3/uL (ref 150–400)
RBC: 3.83 MIL/uL — ABNORMAL LOW (ref 4.22–5.81)
RBC: 3.67 MIL/uL — ABNORMAL LOW (ref 4.22–5.81)
RDW: 15.3 % (ref 11.5–15.5)
RDW: 15.1 % (ref 11.5–15.5)
WBC: 4.8 10*3/uL (ref 4.0–10.5)
WBC: 6.8 10*3/uL (ref 4.0–10.5)
nRBC: 0 % (ref 0.0–0.2)
nRBC: 0 % (ref 0.0–0.2)

## 2019-01-20 LAB — HEPARIN LEVEL (UNFRACTIONATED)
Heparin Unfractionated: 0.5 IU/mL (ref 0.30–0.70)
Heparin Unfractionated: 0.34 IU/mL (ref 0.30–0.70)

## 2019-01-20 LAB — FIBRINOGEN
Fibrinogen: 342 mg/dL (ref 210–475)
Fibrinogen: 252 mg/dL (ref 210–475)

## 2019-01-20 LAB — PROTIME-INR
INR: 1.1 (ref 0.8–1.2)
Prothrombin Time: 14.5 seconds (ref 11.4–15.2)

## 2019-01-20 LAB — APTT: aPTT: 85 seconds — ABNORMAL HIGH (ref 24–36)

## 2019-01-20 LAB — MRSA PCR SCREENING: MRSA by PCR: NEGATIVE

## 2019-01-20 LAB — CREATININE, SERUM
Creatinine, Ser: 1.49 mg/dL — ABNORMAL HIGH (ref 0.61–1.24)
GFR calc Af Amer: 58 mL/min — ABNORMAL LOW (ref >60)
GFR calc non Af Amer: 50 mL/min — ABNORMAL LOW (ref >60)

## 2019-01-20 LAB — BUN: BUN: 23 mg/dL (ref 8–23)

## 2019-01-20 SURGERY — LOWER EXTREMITY ANGIOGRAPHY
Anesthesia: Moderate Sedation | Site: Leg Lower | Laterality: Left

## 2019-01-20 MED ORDER — ASPIRIN EC 81 MG PO TBEC: 81.0000 mg | DELAYED_RELEASE_TABLET | Freq: Every day | ORAL | Status: DC

## 2019-01-20 MED ORDER — FAMOTIDINE 20 MG PO TABS: 40.0000 mg | ORAL_TABLET | Freq: Once | ORAL | Status: DC | PRN

## 2019-01-20 MED ORDER — VITAMIN B-1 50 MG PO TABS: 50.0000 mg | ORAL_TABLET | Freq: Every day | ORAL | Status: DC

## 2019-01-20 MED ORDER — IODIXANOL 320 MG/ML IV SOLN
INTRAVENOUS | Status: DC | PRN
  Administered 2019-01-20: 45 mL via INTRAVENOUS

## 2019-01-20 MED ORDER — MIDAZOLAM HCL 2 MG/ML PO SYRP: 8.0000 mg | ORAL_SOLUTION | Freq: Once | ORAL | Status: DC | PRN

## 2019-01-20 MED ORDER — ONDANSETRON HCL 4 MG/2ML IJ SOLN: 4.0000 mg | Freq: Four times a day (QID) | INTRAMUSCULAR | Status: DC | PRN

## 2019-01-20 MED ORDER — MIDAZOLAM HCL 2 MG/2ML IJ SOLN
INTRAMUSCULAR | Status: DC | PRN
  Administered 2019-01-20: 1 mg via INTRAVENOUS
  Administered 2019-01-20: 2 mg via INTRAVENOUS
  Administered 2019-01-20: 1 mg via INTRAVENOUS

## 2019-01-20 MED ORDER — SODIUM CHLORIDE 0.9 % IV SOLN
250.0000 mL | INTRAVENOUS | Status: DC | PRN
  Administered 2019-01-20: 250 mL via INTRAVENOUS

## 2019-01-20 MED ORDER — FLEET ENEMA 7-19 GM/118ML RE ENEM: 1.0000 | ENEMA | Freq: Once | RECTAL | Status: DC | PRN

## 2019-01-20 MED ORDER — MORPHINE SULFATE (PF) 2 MG/ML IV SOLN
INTRAVENOUS | Status: AC
  Administered 2019-01-20: 2 mg via INTRAVENOUS
  Filled 2019-01-20: qty 1

## 2019-01-20 MED ORDER — MIDAZOLAM HCL 2 MG/2ML IJ SOLN
INTRAMUSCULAR | Status: AC
  Filled 2019-01-20: qty 4

## 2019-01-20 MED ORDER — MORPHINE SULFATE (PF) 4 MG/ML IV SOLN: 2.0000 mg | INTRAVENOUS | Status: DC | PRN

## 2019-01-20 MED ORDER — FENTANYL CITRATE (PF) 100 MCG/2ML IJ SOLN
INTRAMUSCULAR | Status: DC | PRN
  Administered 2019-01-20: 50 ug via INTRAVENOUS
  Administered 2019-01-20 (×2): 25 ug via INTRAVENOUS

## 2019-01-20 MED ORDER — OXYCODONE HCL 5 MG PO TABS: 10.0000 mg | ORAL_TABLET | ORAL | Status: DC | PRN

## 2019-01-20 MED ORDER — SODIUM CHLORIDE 0.9 % IV SOLN
INTRAVENOUS | Status: DC
  Administered 2019-01-20: 12:00:00 via INTRAVENOUS

## 2019-01-20 MED ORDER — HEPARIN SODIUM (PORCINE) 1000 UNIT/ML IJ SOLN
INTRAMUSCULAR | Status: AC
  Filled 2019-01-20: qty 1

## 2019-01-20 MED ORDER — ACETAMINOPHEN 325 MG PO TABS: 650.0000 mg | ORAL_TABLET | Freq: Four times a day (QID) | ORAL | Status: DC | PRN

## 2019-01-20 MED ORDER — DOCUSATE SODIUM 100 MG PO CAPS: 100.0000 mg | ORAL_CAPSULE | Freq: Two times a day (BID) | ORAL | Status: DC

## 2019-01-20 MED ORDER — ONDANSETRON HCL 4 MG/2ML IJ SOLN
4.0000 mg | Freq: Four times a day (QID) | INTRAMUSCULAR | Status: DC | PRN
  Administered 2019-01-20: 4 mg via INTRAVENOUS
  Filled 2019-01-20: qty 2

## 2019-01-20 MED ORDER — OXYCODONE HCL 5 MG PO TABS
5.0000 mg | ORAL_TABLET | ORAL | Status: DC | PRN
  Administered 2019-01-21 – 2019-01-22 (×3): 5 mg via ORAL
  Filled 2019-01-20 (×3): qty 1

## 2019-01-20 MED ORDER — HYDROCODONE-ACETAMINOPHEN 5-325 MG PO TABS: 1.0000 | ORAL_TABLET | ORAL | Status: DC | PRN

## 2019-01-20 MED ORDER — ACETAMINOPHEN 650 MG RE SUPP: 650.0000 mg | Freq: Four times a day (QID) | RECTAL | Status: DC | PRN

## 2019-01-20 MED ORDER — SODIUM CHLORIDE 0.9 % IV SOLN
0.5000 mg/h | INTRAVENOUS | Status: DC
  Administered 2019-01-20: 0.5 mg/h
  Filled 2019-01-20 (×3): qty 10

## 2019-01-20 MED ORDER — LISINOPRIL 10 MG PO TABS: 10.0000 mg | ORAL_TABLET | Freq: Two times a day (BID) | ORAL | Status: DC

## 2019-01-20 MED ORDER — LIDOCAINE-EPINEPHRINE (PF) 1 %-1:200000 IJ SOLN
INTRAMUSCULAR | Status: AC
  Filled 2019-01-20: qty 30

## 2019-01-20 MED ORDER — SODIUM CHLORIDE 0.9 % IV SOLN: INTRAVENOUS | Status: DC

## 2019-01-20 MED ORDER — HYDROCODONE-ACETAMINOPHEN 5-325 MG PO TABS: 1.0000 | ORAL_TABLET | Freq: Four times a day (QID) | ORAL | Status: DC | PRN

## 2019-01-20 MED ORDER — ATORVASTATIN CALCIUM 20 MG PO TABS: 20.0000 mg | ORAL_TABLET | Freq: Every day | ORAL | Status: DC

## 2019-01-20 MED ORDER — DIPHENHYDRAMINE HCL 50 MG/ML IJ SOLN: 50.0000 mg | Freq: Once | INTRAMUSCULAR | Status: DC | PRN

## 2019-01-20 MED ORDER — SORBITOL 70 % SOLN: 30.0000 mL | Freq: Every day | Status: DC | PRN

## 2019-01-20 MED ORDER — HEPARIN (PORCINE) 25000 UT/250ML-% IV SOLN
600.0000 [IU]/h | INTRAVENOUS | Status: DC
  Administered 2019-01-20: 600 [IU]/h via INTRAVENOUS

## 2019-01-20 MED ORDER — HYDROMORPHONE HCL 1 MG/ML IJ SOLN
INTRAMUSCULAR | Status: AC
  Filled 2019-01-20: qty 1

## 2019-01-20 MED ORDER — FLUTICASONE-UMECLIDIN-VILANT 100-62.5-25 MCG/INH IN AEPB: 1.0000 | INHALATION_SPRAY | Freq: Every day | RESPIRATORY_TRACT | Status: DC

## 2019-01-20 MED ORDER — SODIUM CHLORIDE 0.9 % IV SOLN
1.0000 mg/h | INTRAVENOUS | Status: AC
  Administered 2019-01-20 (×2): 1 mg/h
  Filled 2019-01-20: qty 10

## 2019-01-20 MED ORDER — VITAMIN B-12 1000 MCG SL SUBL: 1.0000 | SUBLINGUAL_TABLET | Freq: Every day | SUBLINGUAL | Status: DC

## 2019-01-20 MED ORDER — ONDANSETRON HCL 4 MG PO TABS: 4.0000 mg | ORAL_TABLET | Freq: Four times a day (QID) | ORAL | Status: DC | PRN

## 2019-01-20 MED ORDER — SODIUM CHLORIDE 0.9% FLUSH
3.0000 mL | Freq: Two times a day (BID) | INTRAVENOUS | Status: DC
  Administered 2019-01-22 – 2019-01-23 (×3): 3 mL via INTRAVENOUS

## 2019-01-20 MED ORDER — HYDROMORPHONE HCL 1 MG/ML IJ SOLN
1.0000 mg | INTRAMUSCULAR | Status: AC | PRN
  Administered 2019-01-20: 1 mg via INTRAVENOUS
  Filled 2019-01-20: qty 1

## 2019-01-20 MED ORDER — LABETALOL HCL 5 MG/ML IV SOLN
10.0000 mg | Freq: Once | INTRAVENOUS | Status: AC
  Administered 2019-01-20: 10 mg via INTRAVENOUS
  Filled 2019-01-20: qty 4

## 2019-01-20 MED ORDER — HYDROMORPHONE HCL 1 MG/ML IJ SOLN
1.0000 mg | INTRAMUSCULAR | Status: DC | PRN
  Administered 2019-01-20 – 2019-01-22 (×8): 1 mg via INTRAVENOUS
  Filled 2019-01-20 (×8): qty 1

## 2019-01-20 MED ORDER — METHYLPREDNISOLONE SODIUM SUCC 125 MG IJ SOLR: 125.0000 mg | Freq: Once | INTRAMUSCULAR | Status: DC | PRN

## 2019-01-20 MED ORDER — CEFAZOLIN SODIUM-DEXTROSE 2-4 GM/100ML-% IV SOLN: 2.0000 g | Freq: Once | INTRAVENOUS | Status: DC

## 2019-01-20 MED ORDER — SODIUM CHLORIDE 0.9% FLUSH: 3.0000 mL | INTRAVENOUS | Status: DC | PRN

## 2019-01-20 MED ORDER — HYDROMORPHONE HCL 1 MG/ML IJ SOLN
INTRAMUSCULAR | Status: AC
  Filled 2019-01-20: qty 0.5

## 2019-01-20 MED ORDER — HEPARIN (PORCINE) 25000 UT/250ML-% IV SOLN
INTRAVENOUS | Status: AC
  Administered 2019-01-20: 600 [IU]/h via INTRAVENOUS
  Filled 2019-01-20: qty 250

## 2019-01-20 MED ORDER — HYDROMORPHONE HCL 2 MG/ML IJ SOLN: 1.0000 mg | INTRAMUSCULAR | Status: DC | PRN

## 2019-01-20 MED ORDER — MAGNESIUM HYDROXIDE 400 MG/5ML PO SUSP: 30.0000 mL | Freq: Every day | ORAL | Status: DC | PRN

## 2019-01-20 MED ORDER — FENTANYL CITRATE (PF) 100 MCG/2ML IJ SOLN
INTRAMUSCULAR | Status: AC
  Filled 2019-01-20: qty 2

## 2019-01-20 MED ORDER — HYDROCHLOROTHIAZIDE 25 MG PO TABS: 12.5000 mg | ORAL_TABLET | Freq: Every day | ORAL | Status: DC

## 2019-01-20 MED ORDER — ALTEPLASE 2 MG IJ SOLR
INTRAMUSCULAR | Status: DC | PRN
  Administered 2019-01-20: 8 mg

## 2019-01-20 MED ORDER — MIDAZOLAM HCL 2 MG/2ML IJ SOLN: 1.0000 mg | INTRAMUSCULAR | Status: DC | PRN

## 2019-01-20 MED ORDER — MORPHINE SULFATE (PF) 4 MG/ML IV SOLN: 5.0000 mg | INTRAVENOUS | Status: DC | PRN

## 2019-01-20 MED ORDER — OXYCODONE HCL 5 MG PO TABS: 5.0000 mg | ORAL_TABLET | ORAL | Status: DC | PRN

## 2019-01-20 MED ORDER — OXYCODONE HCL 5 MG PO TABS
10.0000 mg | ORAL_TABLET | ORAL | Status: DC | PRN
  Administered 2019-01-20 – 2019-01-22 (×3): 10 mg via ORAL
  Filled 2019-01-20 (×3): qty 2

## 2019-01-20 MED ORDER — AMLODIPINE BESYLATE 5 MG PO TABS: 2.5000 mg | ORAL_TABLET | Freq: Every day | ORAL | Status: DC

## 2019-01-20 MED ORDER — HYDROMORPHONE HCL 1 MG/ML IJ SOLN
1.0000 mg | Freq: Once | INTRAMUSCULAR | Status: AC | PRN
  Administered 2019-01-20: 1 mg via INTRAVENOUS

## 2019-01-20 SURGICAL SUPPLY — 17 items
CANISTER PENUMBRA ENGINE (MISCELLANEOUS) ×2 IMPLANT
CATH BEACON 5 .038 100 VERT TP (CATHETERS) ×2 IMPLANT
CATH INDIGO CAT6 KIT (CATHETERS) ×2 IMPLANT
CATH INFUS 135CMX50CM (CATHETERS) ×2 IMPLANT
CATH PIG 70CM (CATHETERS) ×4 IMPLANT
GLIDEWIRE ADV .035X260CM (WIRE) ×2 IMPLANT
GUIDEWIRE SUPER STIFF .035X180 (WIRE) ×2 IMPLANT
KIT CATH CVC 3 LUMEN 7FR 8IN (MISCELLANEOUS) ×2 IMPLANT
PACK ANGIOGRAPHY (CUSTOM PROCEDURE TRAY) ×2 IMPLANT
SHEATH BRITE TIP 4FRX11 (SHEATH) ×2 IMPLANT
SHEATH BRITE TIP 5FRX11 (SHEATH) ×4 IMPLANT
SHEATH PINNACLE ST 6F 45CM (SHEATH) ×2 IMPLANT
SUT PROLENE 0 CT 1 30 (SUTURE) ×2 IMPLANT
TOWEL OR 17X26 4PK STRL BLUE (TOWEL DISPOSABLE) ×2 IMPLANT
TUBING CONTRAST HIGH PRESS 72 (TUBING) ×2 IMPLANT
WIRE G V18X300CM (WIRE) ×2 IMPLANT
WIRE J 3MM .035X145CM (WIRE) ×2 IMPLANT

## 2019-01-20 NOTE — Consult Note (Signed)
ANTICOAGULATION CONSULT NOTE - Initial Consult  Pharmacy Consult for Heparin Dosing Indication: DVT  No Known Allergies  Patient Measurements: Height: 5\' 8"  (172.7 cm) Weight: 134 lb (60.8 kg) IBW/kg (Calculated) : 68.4 Heparin Dosing Weight: 60.8  Vital Signs: Temp: 98 F (36.7 C) (07/29 1136) Temp Source: Oral (07/29 1136) BP: 168/88 (07/29 1315) Pulse Rate: 57 (07/29 1315)  Labs: Recent Labs    01/20/19 1132  CREATININE 1.49*    Estimated Creatinine Clearance: 44.8 mL/min (A) (by C-G formula based on SCr of 1.49 mg/dL (H)).   Medical History: Past Medical History:  Diagnosis Date  . Hyperlipidemia   . Hypertension   . Neuromuscular disorder (HCC)    numbness feet  . Peripheral vascular disease ([place])   . Shortness of breath dyspnea     Medications:  Medications Prior to Admission  Medication Sig Dispense Refill Last Dose  . amLODipine (NORVASC) 2.5 MG tablet Take 1 tablet (2.5 mg total) by mouth daily. 30 tablet 0 01/20/2019 at Unknown time  . aspirin EC 81 MG tablet Take 1 tablet (81 mg total) by mouth daily. 90 tablet 3 01/20/2019 at Unknown time  . atorvastatin (LIPITOR) 20 MG tablet Take 1 tablet (20 mg total) by mouth daily. 90 tablet 3 01/19/2019 at Unknown time  . Fluticasone-Umeclidin-Vilant (TRELEGY ELLIPTA) 100-62.5-25 MCG/INH AEPB Inhale 1 puff into the lungs daily. 60 each 2 01/20/2019 at Unknown time  . ibuprofen (ADVIL) 200 MG tablet Take 200 mg by mouth every 4 (four) hours as needed.   01/20/2019 at Unknown time  . lisinopril (ZESTRIL) 10 MG tablet Take 1 tablet (10 mg total) by mouth 2 (two) times a day. 180 tablet 0 01/20/2019 at Unknown time  . apixaban (ELIQUIS) 5 MG TABS tablet Take 1 tablet (5 mg total) by mouth 2 (two) times daily. 180 tablet 3 01/18/2019  . Cyanocobalamin (VITAMIN B-12) 1000 MCG SUBL Place 1 tablet (1,000 mcg total) under the tongue daily. 90 tablet 0   . hydrochlorothiazide (HYDRODIURIL) 12.5 MG tablet Take 1 tablet (12.5 mg  total) by mouth daily. 30 tablet 0 01/18/2019  . HYDROcodone-acetaminophen (NORCO) 5-325 MG tablet Take 1-2 tablets by mouth every 6 (six) hours as needed for moderate pain or severe pain. (Patient not taking: Reported on 01/20/2019) 28 tablet 0 Not Taking at Unknown time  . nicotine (NICODERM CQ - DOSED IN MG/24 HOURS) 21 mg/24hr patch Place 1 patch (21 mg total) onto the skin daily. (Patient not taking: Reported on 01/20/2019) 28 patch 1 Not Taking at Unknown time  . thiamine (VITAMIN B-1) 50 MG tablet Take 1 tablet (50 mg total) by mouth daily. 30 tablet 2    Scheduled:  . amLODipine  2.5 mg Oral Daily  . aspirin EC  81 mg Oral Daily  . atorvastatin  20 mg Oral Daily  . fentaNYL      . Fluticasone-Umeclidin-Vilant  1 puff Inhalation Daily  . heparin      . hydrochlorothiazide  12.5 mg Oral Daily  . lidocaine-EPINEPHrine      . lisinopril  10 mg Oral BID  . midazolam      . sodium chloride flush  3 mL Intravenous Q12H  . thiamine  50 mg Oral Daily  . Vitamin B-12  1 tablet Sublingual Daily   Infusions:  . sodium chloride 75 mL/hr at 01/20/19 1146  . sodium chloride    . sodium chloride    . alteplase (LIMB ISCHEMIA) 10 mg in normal saline (0.02 mg/mL)  infusion    . alteplase (LIMB ISCHEMIA) 10 mg in normal saline (0.02 mg/mL) infusion    . heparin 600 Units/hr (01/20/19 1350)   PRN: sodium chloride, HYDROcodone-acetaminophen, midazolam, morphine injection, morphine injection, ondansetron (ZOFRAN) IV, sodium chloride flush Anti-infectives (From admission, onward)   Start     Dose/Rate Route Frequency Ordered Stop   01/19/19 2330  ceFAZolin (ANCEF) IVPB 2g/100 mL premix     2 g 200 mL/hr over 30 Minutes Intravenous  Once 01/19/19 2321 01/20/19 1311      Assessment: Pharmacy has been consulted for Heparin dosing on 62yo patient with a longstanding history of PAD. Patient developed sudden sever pain in his right lower extremity that was different from his normal claudification that  he has 2 days ago. 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 Surgeon started heparin while in Cath lab. Baseline labs have been ordered and results are pending.  Goal of Therapy:  Heparin level 0.2-0.5 units/ml Monitor platelets by anticoagulation protocol: Yes   Plan:  Surgeon stated since patient had received tPA during the procedure, a bolus dose of heparin was not needed. Would like to dose patient at 10units/kg/hr initially. Will start heparin infusion at 600 units/hr Check anti-Xa level in 6 hours and daily while on heparin Continue to monitor H&H and platelets  Jasmine Maceachern A Mathews Stuhr 01/20/2019,1:58 PM

## 2019-01-20 NOTE — Consult Note (Addendum)
ANTICOAGULATION CONSULT NOTE - Initial Consult  Pharmacy Consult for Heparin Dosing Indication: DVT  No Known Allergies  Patient Measurements: Height: 5\' 8"  (172.7 cm) Weight: 134 lb 14.7 oz (61.2 kg) IBW/kg (Calculated) : 68.4 Heparin Dosing Weight: 60.8  Vital Signs: Temp: 97.8 F (36.6 C) (07/29 1900) Temp Source: Oral (07/29 1900) BP: 107/87 (07/29 1900) Pulse Rate: 83 (07/29 1900)  Labs: Recent Labs    01/20/19 1132 01/20/19 1630 01/20/19 1631 01/20/19 1908  HGB  --  11.5*  --  11.1*  HCT  --  35.1*  --  33.6*  PLT  --  201  --  186  APTT  --   --  85*  --   LABPROT  --   --  14.5  --   INR  --   --  1.1  --   HEPARINUNFRC  --  0.50  --  0.34  CREATININE 1.49*  --   --   --     Estimated Creatinine Clearance: 45.1 mL/min (A) (by C-G formula based on SCr of 1.49 mg/dL (H)).   Medical History: Past Medical History:  Diagnosis Date  . Hyperlipidemia   . Hypertension   . Neuromuscular disorder (HCC)    numbness feet  . Peripheral vascular disease ([place])   . Shortness of breath dyspnea     Medications:  Facility-Administered Medications Prior to Admission  Medication Dose Route Frequency Provider Last Rate Last Dose  . HYDROmorphone (DILAUDID) injection 1 mg  1 mg Intravenous Q3H PRN Stegmayer, Kimberly A, PA-C      . oxyCODONE (Oxy IR/ROXICODONE) immediate release tablet 10 mg  10 mg Oral Q4H PRN Stegmayer, Kimberly A, PA-C      . oxyCODONE (Oxy IR/ROXICODONE) immediate release tablet 5 mg  5 mg Oral Q4H PRN Stegmayer, Kimberly A, PA-C       Medications Prior to Admission  Medication Sig Dispense Refill Last Dose  . amLODipine (NORVASC) 2.5 MG tablet Take 1 tablet (2.5 mg total) by mouth daily. 30 tablet 0 01/20/2019 at Unknown time  . aspirin EC 81 MG tablet Take 1 tablet (81 mg total) by mouth daily. 90 tablet 3 01/20/2019 at Unknown time  . atorvastatin (LIPITOR) 20 MG tablet Take 1 tablet (20 mg total) by mouth daily. 90 tablet 3 01/19/2019 at Unknown  time  . Fluticasone-Umeclidin-Vilant (TRELEGY ELLIPTA) 100-62.5-25 MCG/INH AEPB Inhale 1 puff into the lungs daily. 60 each 2 01/20/2019 at Unknown time  . ibuprofen (ADVIL) 200 MG tablet Take 200 mg by mouth every 4 (four) hours as needed.   01/20/2019 at Unknown time  . lisinopril (ZESTRIL) 10 MG tablet Take 1 tablet (10 mg total) by mouth 2 (two) times a day. 180 tablet 0 01/20/2019 at Unknown time  . apixaban (ELIQUIS) 5 MG TABS tablet Take 1 tablet (5 mg total) by mouth 2 (two) times daily. 180 tablet 3 01/18/2019  . Cyanocobalamin (VITAMIN B-12) 1000 MCG SUBL Place 1 tablet (1,000 mcg total) under the tongue daily. 90 tablet 0   . hydrochlorothiazide (HYDRODIURIL) 12.5 MG tablet Take 1 tablet (12.5 mg total) by mouth daily. 30 tablet 0 01/18/2019  . HYDROcodone-acetaminophen (NORCO) 5-325 MG tablet Take 1-2 tablets by mouth every 6 (six) hours as needed for moderate pain or severe pain. (Patient not taking: Reported on 01/20/2019) 28 tablet 0 Not Taking at Unknown time  . nicotine (NICODERM CQ - DOSED IN MG/24 HOURS) 21 mg/24hr patch Place 1 patch (21 mg total) onto the skin  daily. (Patient not taking: Reported on 01/20/2019) 28 patch 1 Not Taking at Unknown time  . thiamine (VITAMIN B-1) 50 MG tablet Take 1 tablet (50 mg total) by mouth daily. 30 tablet 2    Scheduled:  . fentaNYL      . heparin      . HYDROmorphone      . lidocaine-EPINEPHrine      . midazolam      . sodium chloride flush  3 mL Intravenous Q12H   Infusions:  . sodium chloride 10 mL/hr at 01/20/19 1900  . alteplase (LIMB ISCHEMIA) 10 mg in normal saline (0.02 mg/mL) infusion 0.5 mg/hr (01/20/19 1900)  . heparin 600 Units/hr (01/20/19 1900)   PRN: sodium chloride, ondansetron (ZOFRAN) IV, oxyCODONE, oxyCODONE, sodium chloride flush Anti-infectives (From admission, onward)   Start     Dose/Rate Route Frequency Ordered Stop   01/20/19 1515  ceFAZolin (ANCEF) IVPB 2g/100 mL premix  Status:  Discontinued     2 g 200 mL/hr  over 30 Minutes Intravenous  Once 01/20/19 1512 01/20/19 1620   01/19/19 2330  ceFAZolin (ANCEF) IVPB 2g/100 mL premix     2 g 200 mL/hr over 30 Minutes Intravenous  Once 01/19/19 2321 01/20/19 1631      Assessment: Pharmacy has been consulted for Heparin dosing on 62yo patient with a longstanding history of PAD. Patient developed sudden sever pain in his right lower extremity that was different from his normal claudification that he has 2 days ago. 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 Surgeon started heparin while in Cath lab. Baseline labs: INR 1.1, APTT 85, Hgb 11.5  7/29@1908 : HL 0.34  Goal of Therapy:  Heparin level 0.2-0.5 units/ml Monitor platelets by anticoagulation protocol: Yes   Plan:  Surgeon stated since patient had received tPA during the procedure, a bolus dose of heparin was not needed. Would like to dose patient at 10units/kg/hr initially.  Current level is therapeutic. Hgb remains stable. Will continue heparin infusion at 600 units/hr Check anti-Xa level in 6 hours and daily while on heparin Continue to monitor H&H and platelets  Vivianne Carles A Kevontae Burgoon 01/20/2019,8:13 PM

## 2019-01-20 NOTE — H&P (Signed)
[facility] History & Physical Update  The patient was interviewed and re-examined.  The patient's previous History and Physical has been reviewed and is unchanged.  There is no change in the plan of care. We plan to proceed with the scheduled procedure.  Leotis Pain, MD  01/20/2019, 11:40 AM

## 2019-01-20 NOTE — Progress Notes (Signed)
Dr. Lucky Cowboy at bedside, speaking with pt. Re: procedural results. Pt. Verbalized understanding of conversation. MD to call pt. Daughter Doretha per pt. Request. Left groin site without any complications. Right DP pulse "barely" audible with doppler.

## 2019-01-20 NOTE — Op Note (Signed)
[facility]  Percutaneous Study/Intervention Procedural Note   Date of Surgery: 01/20/2019  Surgeon(s):Mariluz Crespo    Assistants:none  Pre-operative Diagnosis: PAD with rest [name], acute on chronic ischemia  Post-operative diagnosis:  Same  Procedure(s) Performed:             1.  Ultrasound guidance for vascular access left femoral artery             2.  Catheter placement into right common femoral artery from left femoral approach             3.  Aortogram and selective right lower extremity angiogram             4.   Catheter directed thrombolytic therapy with 8 mg of TPA instilled in the right SFA             5.   Mechanical thrombectomy to the right SFA, popliteal artery, tibioperoneal trunk, and posterior tibial arteries with the penumbra cat 6 device  6.  Catheter placement for continuous infusion of thrombolytic therapy in the right SFA, popliteal artery, tibioperoneal trunk, and proximal posterior tibial artery             7.   Left femoral venous triple-lumen catheter placement with ultrasound guidance  EBL: 250 cc  Contrast: 45 cc  Fluoro Time: 11 minutes  Moderate Conscious Sedation Time: approximately 60 minutes using 4 mg of Versed and 100 Mcg of Fentanyl              Indications:  Patient is a 63 y.o.male with worsening ischemic rest [name] of the right foot. The patient has noninvasive study showing occlusion of his previous interventions with markedly reduced flow distally. The patient is brought in for angiography for further evaluation and potential treatment.  Due to the limb threatening nature of the situation, angiogram was performed for attempted limb salvage. The patient is aware that if the procedure fails, amputation would be expected.  The patient also understands that even with successful revascularization, amputation may still be required due to the severity of the situation.  Risks and benefits are discussed and informed  consent is obtained.   Procedure:  The patient was identified and appropriate procedural time out was performed.  The patient was then placed supine on the table and prepped and draped in the usual sterile fashion. Moderate conscious sedation was administered during a face to face encounter with the patient throughout the procedure with my supervision of the RN administering medicines and monitoring the patient's vital signs, pulse oximetry, telemetry and mental status throughout from the start of the procedure until the patient was taken to the recovery room. Ultrasound was used to evaluate the left common femoral artery.  It was patent .  A digital ultrasound image was acquired.  A Seldinger needle was used to access the left common femoral artery under direct ultrasound guidance and a permanent image was performed.  A 0.035 J wire was advanced without resistance and a 5Fr sheath was placed.  Pigtail catheter was placed into the aorta and an AP aortogram was performed. This demonstrated that the right renal artery appeared to be occluded, left renal artery was widely patent.  Aorta and iliac arteries were patent without significant stenosis. I then crossed the aortic bifurcation and advanced to the right femoral head. Selective right lower extremity angiogram was then performed. This demonstrated flush occlusion of the right SFA and all of the previously placed stents  in the SFA and popliteal arteries.  There was very faint distal reconstitution of the posterior tibial artery and anterior tibial artery and their more distal segments.  Flow was extremely sluggish.  The proximal portion of all 3 tibial vessels in the popliteal artery appeared to be occluded. It was felt that it was in the patient's best interest to proceed with intervention after these images to avoid a second procedure and a larger amount of contrast and fluoroscopy based off of the findings from the initial angiogram.  I advanced the pigtail  catheter into the mid to distal right SFA and then deployed 8 mg of TPA to perform catheter directed thrombolytic therapy to the thrombosed area.  This was allowed to dwell for several minutes.  The patient was systemically heparinized and a 6 [place] destination sheath was then placed over the [employer] wire. I then used a Kumpe catheter and the advantage wire to easily cross the occlusion and get down into the posterior tibial artery although it remained occluded at this level.  The penumbra cat 6 device was then brought onto the field after exchanging for a 0.018 wire.  Multiple passes of mechanical thrombectomy were then performed from the origin of the right SFA down through the entire SFA and popliteal arteries.  I advanced into the tibioperoneal trunk and into the previous posterior tibial artery stent as well.  Several large chunks of thrombus were removed but there remained multiple areas of thrombosis within the popliteal and SFA and completely occlusive thrombosis of all 3 tibial vessels.  At this point, I felt our only hope that revascularization would be a continuous infusion of TPA and to come back for second look angiogram.  A 130 cm total length 50 cm working length thrombolytic catheter was then placed over the V 18 wire with the distal portion in the proximal posterior tibial artery and going up through the tibioperoneal trunk, popliteal artery, and into the proximal SFA. I then obtain durable venous access with a central line placement in the left femoral vein.  Ultrasound was used to visualize the left femoral vein which was patent.  It was accessed under direct ultrasound guidance without difficulty with a Seldinger needle.  A J-wire was placed.  After dilatation, a triple-lumen catheter was placed over the wire and the wire was removed.  It was secured with 2 Prolene sutures.  The sheath and the thrombolytic catheter were also secured to the thigh with 2 Prolene sutures.  The patient was  taken to the recovery room in stable condition having tolerated the procedure well.  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  Aorta and iliac arteries were patent without significant stenosis             Right lower Extremity:  This demonstrated flush occlusion of the right SFA and all of the previously placed stents in the SFA and popliteal arteries.  There was very faint distal reconstitution of the posterior tibial artery and anterior tibial artery and their more distal segments.  Flow was extremely sluggish.  The proximal portion of all 3 tibial vessels in the popliteal artery appeared to be occluded   Disposition: Patient was taken to the recovery room in stable condition having tolerated the procedure well.  Complications: None  Ryan [name] 01/20/2019 1:44 PM   This note was created with [employer]  Medical transcription system. Any errors in dictation are purely unintentional.

## 2019-01-20 NOTE — Progress Notes (Signed)
Dr. Lucky Cowboy paged for SBP consistently reading >180. Christy from vascular called back and said that she would get in touch with MD and call me back regarding same. Will continue to monitor.

## 2019-01-21 ENCOUNTER — Encounter
Admission: AD | Disposition: A | Discharge status: Home / Self Care | Payer: BC Managed Care – PPO | Source: Ambulatory Visit | Attending: Vascular Surgery

## 2019-01-21 ENCOUNTER — Encounter: Payer: Self-pay | Admitting: Vascular Surgery

## 2019-01-21 DIAGNOSIS — I70221 Atherosclerosis of native arteries of extremities with rest pain, right leg: Secondary | ICD-10-CM

## 2019-01-21 DIAGNOSIS — I998 Other disorder of circulatory system: Secondary | ICD-10-CM

## 2019-01-21 HISTORY — PX: LOWER EXTREMITY ANGIOGRAPHY: CATH118251

## 2019-01-21 LAB — BASIC METABOLIC PANEL
Anion gap: 6 (ref 5–15)
BUN: 24 mg/dL — ABNORMAL HIGH (ref 8–23)
CO2: 23 mmol/L (ref 22–32)
Calcium: 8.4 mg/dL — ABNORMAL LOW (ref 8.9–10.3)
Chloride: 109 mmol/L (ref 98–111)
Creatinine, Ser: 1.88 mg/dL — ABNORMAL HIGH (ref 0.61–1.24)
GFR calc Af Amer: 44 mL/min — ABNORMAL LOW (ref >60)
GFR calc non Af Amer: 38 mL/min — ABNORMAL LOW (ref >60)
Glucose, Bld: 112 mg/dL — ABNORMAL HIGH (ref 70–99)
Potassium: 5.1 mmol/L (ref 3.5–5.1)
Sodium: 138 mmol/L (ref 135–145)

## 2019-01-21 LAB — CBC
HCT: 31.9 % — ABNORMAL LOW (ref 39.0–52.0)
HCT: 28 % — ABNORMAL LOW (ref 39.0–52.0)
Hemoglobin: 10.4 g/dL — ABNORMAL LOW (ref 13.0–17.0)
Hemoglobin: 9.1 g/dL — ABNORMAL LOW (ref 13.0–17.0)
MCH: 29.8 pg (ref 26.0–34.0)
MCH: 30 pg (ref 26.0–34.0)
MCHC: 32.6 g/dL (ref 30.0–36.0)
MCHC: 32.5 g/dL (ref 30.0–36.0)
MCV: 91.4 fL (ref 80.0–100.0)
MCV: 92.4 fL (ref 80.0–100.0)
Platelets: 165 10*3/uL (ref 150–400)
Platelets: 172 10*3/uL (ref 150–400)
RBC: 3.49 MIL/uL — ABNORMAL LOW (ref 4.22–5.81)
RBC: 3.03 MIL/uL — ABNORMAL LOW (ref 4.22–5.81)
RDW: 15.3 % (ref 11.5–15.5)
RDW: 15.5 % (ref 11.5–15.5)
WBC: 5.6 10*3/uL (ref 4.0–10.5)
WBC: 6.5 10*3/uL (ref 4.0–10.5)
nRBC: 0 % (ref 0.0–0.2)
nRBC: 0 % (ref 0.0–0.2)

## 2019-01-21 LAB — HEPARIN LEVEL (UNFRACTIONATED)
Heparin Unfractionated: 0.21 IU/mL — ABNORMAL LOW (ref 0.30–0.70)
Heparin Unfractionated: 0.24 IU/mL — ABNORMAL LOW (ref 0.30–0.70)

## 2019-01-21 LAB — MAGNESIUM: Magnesium: 2 mg/dL (ref 1.7–2.4)

## 2019-01-21 LAB — FIBRINOGEN
Fibrinogen: 243 mg/dL (ref 210–475)
Fibrinogen: 245 mg/dL (ref 210–475)

## 2019-01-21 LAB — GLUCOSE, CAPILLARY: Glucose-Capillary: 113 mg/dL — ABNORMAL HIGH (ref 70–99)

## 2019-01-21 SURGERY — LOWER EXTREMITY ANGIOGRAPHY
Anesthesia: Moderate Sedation | Laterality: Right

## 2019-01-21 MED ORDER — HEPARIN SODIUM (PORCINE) 1000 UNIT/ML IJ SOLN
INTRAMUSCULAR | Status: DC | PRN
  Administered 2019-01-21: 3000 [IU] via INTRAVENOUS

## 2019-01-21 MED ORDER — MIDAZOLAM HCL 2 MG/2ML IJ SOLN
INTRAMUSCULAR | Status: DC | PRN
  Administered 2019-01-21: 2 mg via INTRAVENOUS
  Administered 2019-01-21: 1 mg via INTRAVENOUS

## 2019-01-21 MED ORDER — FENTANYL CITRATE (PF) 100 MCG/2ML IJ SOLN
INTRAMUSCULAR | Status: AC
  Administered 2019-01-21: 08:00:00
  Filled 2019-01-21: qty 2

## 2019-01-21 MED ORDER — TIROFIBAN HCL IN NACL 5-0.9 MG/100ML-% IV SOLN
0.1500 ug/kg/min | INTRAVENOUS | Status: DC
  Administered 2019-01-21 – 2019-01-22 (×2): 0.15 ug/kg/min via INTRAVENOUS
  Filled 2019-01-21 (×3): qty 100

## 2019-01-21 MED ORDER — CEFAZOLIN SODIUM-DEXTROSE 2-4 GM/100ML-% IV SOLN: 2.0000 g | Freq: Once | INTRAVENOUS | Status: DC

## 2019-01-21 MED ORDER — LABETALOL HCL 5 MG/ML IV SOLN
INTRAVENOUS | Status: AC
  Administered 2019-01-21: 09:00:00
  Filled 2019-01-21: qty 4

## 2019-01-21 MED ORDER — SODIUM CHLORIDE 0.9 % IV SOLN: INTRAVENOUS | Status: DC

## 2019-01-21 MED ORDER — LABETALOL HCL 5 MG/ML IV SOLN
INTRAVENOUS | Status: DC | PRN
  Administered 2019-01-21: 10 mg via INTRAVENOUS

## 2019-01-21 MED ORDER — CEFAZOLIN SODIUM-DEXTROSE 2-4 GM/100ML-% IV SOLN
INTRAVENOUS | Status: AC
  Administered 2019-01-21: 08:00:00
  Filled 2019-01-21: qty 100

## 2019-01-21 MED ORDER — LIDOCAINE-EPINEPHRINE (PF) 1 %-1:200000 IJ SOLN
INTRAMUSCULAR | Status: AC
  Administered 2019-01-21: 08:00:00
  Filled 2019-01-21: qty 90

## 2019-01-21 MED ORDER — TIROFIBAN (AGGRASTAT) BOLUS VIA INFUSION
25.0000 ug/kg | Freq: Once | INTRAVENOUS | Status: AC
  Administered 2019-01-21: 12500 ug via INTRAVENOUS
  Filled 2019-01-21: qty 31

## 2019-01-21 MED ORDER — HEPARIN SODIUM (PORCINE) 1000 UNIT/ML IJ SOLN
INTRAMUSCULAR | Status: AC
  Administered 2019-01-21: 08:00:00
  Filled 2019-01-21: qty 1

## 2019-01-21 MED ORDER — FENTANYL CITRATE (PF) 100 MCG/2ML IJ SOLN
INTRAMUSCULAR | Status: DC | PRN
  Administered 2019-01-21: 50 ug via INTRAVENOUS
  Administered 2019-01-21: 25 ug via INTRAVENOUS

## 2019-01-21 MED ORDER — TIROFIBAN HCL IV 12.5 MG/250 ML
INTRAVENOUS | Status: AC
  Administered 2019-01-21: 12500 ug via INTRAVENOUS
  Filled 2019-01-21: qty 250

## 2019-01-21 MED ORDER — MIDAZOLAM HCL 5 MG/5ML IJ SOLN
INTRAMUSCULAR | Status: AC
  Administered 2019-01-21: 08:00:00
  Filled 2019-01-21: qty 5

## 2019-01-21 MED ORDER — LIDOCAINE HCL (PF) 1 % IJ SOLN
INTRAMUSCULAR | Status: AC
  Administered 2019-01-21: 08:00:00
  Filled 2019-01-21: qty 30

## 2019-01-21 SURGICAL SUPPLY — 16 items
BALLN LUTONIX 018 5X60X130 (BALLOONS) ×2
BALLN ULTRVRSE 2.5X300X150 (BALLOONS) ×2
BALLOON LUTONIX 018 5X60X130 (BALLOONS) IMPLANT
BALLOON ULTRVRSE 2.5X300X150 (BALLOONS) IMPLANT
CANISTER PENUMBRA ENGINE (MISCELLANEOUS) ×1 IMPLANT
CATH BEACON 5 .038 100 VERT TP (CATHETERS) ×1 IMPLANT
CATH INDIGO CAT6 KIT (CATHETERS) ×1 IMPLANT
CHLORAPREP W/TINT 26 (MISCELLANEOUS) ×1 IMPLANT
DEVICE PRESTO INFLATION (MISCELLANEOUS) ×1 IMPLANT
DEVICE SAFEGUARD 24CM (GAUZE/BANDAGES/DRESSINGS) ×1 IMPLANT
DEVICE STARCLOSE SE CLOSURE (Vascular Products) ×1 IMPLANT
PACK ANGIOGRAPHY (CUSTOM PROCEDURE TRAY) ×2 IMPLANT
SHIELD RADPAD DADD DRAPE 4X9 (MISCELLANEOUS) ×1 IMPLANT
VALVE HEMO TOUHY BORST Y (ADAPTER) ×1 IMPLANT
WIRE G V18X300CM (WIRE) ×1 IMPLANT
WIRE J 3MM .035X145CM (WIRE) ×1 IMPLANT

## 2019-01-21 NOTE — Op Note (Signed)
[facility]  Percutaneous Study/Intervention Procedural Note   Date of Surgery: 01/21/2019  Surgeon(s):DEW,JASON    Assistants:none  Pre-operative Diagnosis: PAD with rest pain right foot  Post-operative diagnosis:  Same  Procedure(s) Performed:             1.   Right lower extremity angiogram through existing catheters             2.  Catheter placement into right posterior tibial artery and right anterior tibial artery from left femoral approach             3.   Mechanical thrombectomy with the penumbra cat 6 device to the right SFA, popliteal artery, the tibioperoneal trunk, posterior tibial artery, and anterior tibial arteries             4.  Percutaneous transluminal angioplasty of right posterior tibial artery with 2.5 mm diameter by 30 cm length angioplasty balloon             5.   Percutaneous transluminal angioplasty of the right proximal SFA with 5 mm diameter by 6 cm length Lutonix drug-coated angioplasty balloon  6.  Percutaneous transluminal angioplasty of the right anterior tibial artery with 2.5 mm diameter by 30 cm length angioplasty balloon             7.  StarClose closure device left femoral artery  EBL: 500 cc  Contrast: 90 cc  Fluoro Time: 19.3 minutes  Moderate Conscious Sedation Time: approximately 45 minutes using 3 mg of Versed and 75 Mcg of Fentanyl              Indications:  Patient is a 63 y.o.male with ischemia of the right lower extremity.  He has been running on TPN overnight and is brought back for second look angiography. The patient is brought in for angiography for further evaluation and potential treatment.  Due to the limb threatening nature of the situation, angiogram was performed for attempted limb salvage. The patient is aware that if the procedure fails, amputation would be expected.  The patient also understands that even with successful revascularization, amputation may still be required due to the severity of the  situation.  Risks and benefits are discussed and informed consent is obtained.   Procedure:  The patient was identified and appropriate procedural time out was performed.  The patient was then placed supine on the table and prepped and draped in the usual sterile fashion. Moderate conscious sedation was administered during a face to face encounter with the patient throughout the procedure with my supervision of the RN administering medicines and monitoring the patient's vital signs, pulse oximetry, telemetry and mental status throughout from the start of the procedure until the patient was taken to the recovery room.  0.018 wire was placed through the existing thrombolytic catheter and the thrombolytic catheter was removed. Selective right lower extremity angiogram was then performed. This demonstrated continued poor flow distally with a chunk of thrombus in the proximal SFA limiting inflow.  With injections beyond this, the flow was very sluggish with continued occlusion of the distal popliteal artery, tibioperoneal trunk and posterior tibial artery, and anterior tibial artery.. It was felt that it was in the patient's best interest to proceed with intervention after these images to avoid a second procedure and a larger amount of contrast and fluoroscopy based off of the findings from the initial angiogram. The patient was systemically heparinized and a penumbra cat 6 device was brought onto  the field.  Multiple passes were made concentrating on the proximal SFA, and then down in the popliteal artery, tibioperoneal trunk, and posterior tibial arteries.  The proximal SFA lesion really did not change much there was some improved flow in the posterior tibial artery proximally but continued occlusion distally.  I then proceeded with angioplasty of both sites.  A 5 mm diameter by 6 cm length Lutonix drug-coated angioplasty balloon was inflated to 10 atm in the proximal SFA for 1 minute.  A 2.5 mm diameter by 30 cm was  inflated twice to cover the whole length from the foot up to the tibioperoneal trunk.  The distal inflation was 8 atm the proximal flexion was 12 atm.  Following this, with the catheter down in the TP trunk there appeared to be flow to the foot but then it essentially stopped.  The proximal SFA lesion was markedly improved with only about a 20% residual stenosis and no impairment of the profunda femoris artery.  I then turned my attention to the anterior tibial artery.  Using the Kumpe catheter and the V 18 wire was able to easily cannulate this and perform a selective injection of the anterior tibial artery.  It did reconstitute in the foot.  A wire was then taken across the occlusion.  The penumbra device was brought onto the field and advanced into the proximal anterior tibial artery to perform mechanical thrombectomy but it would not pass beyond the proximal portion of vessel.  I then performed angioplasty with 2 inflations with a 2.5 mm diameter by 30 cm length angioplasty balloon to cover the entire length.  Again, the distal inflation was approximately 8 atm and the more proximal inflation was 10 atm.  Completion imaging showed this to be the inline flow to the foot but it still had distal occlusion in the foot and with an injection more proximally the flow remained sluggish.  At this point I did not think there was much more we could do from an endovascular standpoint and we will keep patient on anticoagulants for the distal disease. I elected to terminate the procedure. The sheath was removed and StarClose closure device was deployed in the left femoral artery with excellent hemostatic result. The patient was taken to the recovery room in stable condition having tolerated the procedure well.  Findings:                          Right Lower Extremity:  Continued poor flow distally with a chunk of thrombus in the proximal SFA limiting inflow.  With injections beyond this, the flow was very sluggish with  continued occlusion of the distal popliteal artery, tibioperoneal trunk and posterior tibial artery, and anterior tibial artery.   Disposition: Patient was taken to the recovery room in stable condition having tolerated the procedure well.  Complications: None  Leotis Pain 01/21/2019 8:51 AM   This note was created with Dragon Medical transcription system. Any errors in dictation are purely unintentional.

## 2019-01-21 NOTE — Consult Note (Signed)
ANTICOAGULATION CONSULT NOTE - Initial Consult  Pharmacy Consult for Heparin Dosing Indication: DVT  No Known Allergies  Patient Measurements: Height: 5\' 8"  (172.7 cm) Weight: 134 lb 14.7 oz (61.2 kg) IBW/kg (Calculated) : 68.4 Heparin Dosing Weight: 60.8  Vital Signs: Temp: 98.2 F (36.8 C) (07/30 0000) Temp Source: Oral (07/30 0000) BP: 105/75 (07/30 0200) Pulse Rate: 66 (07/30 0200)  Labs: Recent Labs    01/20/19 1132  01/20/19 1630 01/20/19 1631 01/20/19 1908 01/21/19 0131  HGB  --    < > 11.5*  --  11.1* 10.4*  HCT  --   --  35.1*  --  33.6* 31.9*  PLT  --   --  201  --  186 165  APTT  --   --   --  85*  --   --   LABPROT  --   --   --  14.5  --   --   INR  --   --   --  1.1  --   --   HEPARINUNFRC  --   --  0.50  --  0.34 0.21*  CREATININE 1.49*  --   --   --   --  1.88*   < > = values in this interval not displayed.    Estimated Creatinine Clearance: 35.7 mL/min (A) (by C-G formula based on SCr of 1.88 mg/dL (H)).   Medical History: Past Medical History:  Diagnosis Date  . Hyperlipidemia   . Hypertension   . Neuromuscular disorder (HCC)    numbness feet  . Peripheral vascular disease ([place])   . Shortness of breath dyspnea     Medications:  Facility-Administered Medications Prior to Admission  Medication Dose Route Frequency Provider Last Rate Last Dose  . HYDROmorphone (DILAUDID) injection 1 mg  1 mg Intravenous Q3H PRN Stegmayer, Kimberly A, PA-C      . oxyCODONE (Oxy IR/ROXICODONE) immediate release tablet 10 mg  10 mg Oral Q4H PRN Stegmayer, Kimberly A, PA-C      . oxyCODONE (Oxy IR/ROXICODONE) immediate release tablet 5 mg  5 mg Oral Q4H PRN Stegmayer, Kimberly A, PA-C       Medications Prior to Admission  Medication Sig Dispense Refill Last Dose  . amLODipine (NORVASC) 2.5 MG tablet Take 1 tablet (2.5 mg total) by mouth daily. 30 tablet 0 01/20/2019 at Unknown time  . aspirin EC 81 MG tablet Take 1 tablet (81 mg total) by mouth daily. 90 tablet  3 01/20/2019 at Unknown time  . atorvastatin (LIPITOR) 20 MG tablet Take 1 tablet (20 mg total) by mouth daily. 90 tablet 3 01/19/2019 at Unknown time  . Fluticasone-Umeclidin-Vilant (TRELEGY ELLIPTA) 100-62.5-25 MCG/INH AEPB Inhale 1 puff into the lungs daily. 60 each 2 01/20/2019 at Unknown time  . ibuprofen (ADVIL) 200 MG tablet Take 200 mg by mouth every 4 (four) hours as needed.   01/20/2019 at Unknown time  . lisinopril (ZESTRIL) 10 MG tablet Take 1 tablet (10 mg total) by mouth 2 (two) times a day. 180 tablet 0 01/20/2019 at Unknown time  . apixaban (ELIQUIS) 5 MG TABS tablet Take 1 tablet (5 mg total) by mouth 2 (two) times daily. 180 tablet 3 01/18/2019  . Cyanocobalamin (VITAMIN B-12) 1000 MCG SUBL Place 1 tablet (1,000 mcg total) under the tongue daily. 90 tablet 0   . hydrochlorothiazide (HYDRODIURIL) 12.5 MG tablet Take 1 tablet (12.5 mg total) by mouth daily. 30 tablet 0 01/18/2019  . HYDROcodone-acetaminophen (NORCO) 5-325 MG tablet  Take 1-2 tablets by mouth every 6 (six) hours as needed for moderate pain or severe pain. (Patient not taking: Reported on 01/20/2019) 28 tablet 0 Not Taking at Unknown time  . nicotine (NICODERM CQ - DOSED IN MG/24 HOURS) 21 mg/24hr patch Place 1 patch (21 mg total) onto the skin daily. (Patient not taking: Reported on 01/20/2019) 28 patch 1 Not Taking at Unknown time  . thiamine (VITAMIN B-1) 50 MG tablet Take 1 tablet (50 mg total) by mouth daily. 30 tablet 2    Scheduled:  . sodium chloride flush  3 mL Intravenous Q12H   Infusions:  . sodium chloride 1 mL/hr at 01/21/19 0200  . alteplase (LIMB ISCHEMIA) 10 mg in normal saline (0.02 mg/mL) infusion 0.5 mg/hr (01/21/19 0200)  . heparin 600 Units/hr (01/21/19 0200)   PRN: sodium chloride, HYDROmorphone (DILAUDID) injection, ondansetron (ZOFRAN) IV, oxyCODONE, oxyCODONE, sodium chloride flush Anti-infectives (From admission, onward)   Start     Dose/Rate Route Frequency Ordered Stop   01/20/19 1515  ceFAZolin  (ANCEF) IVPB 2g/100 mL premix  Status:  Discontinued     2 g 200 mL/hr over 30 Minutes Intravenous  Once 01/20/19 1512 01/20/19 1620   01/19/19 2330  ceFAZolin (ANCEF) IVPB 2g/100 mL premix     2 g 200 mL/hr over 30 Minutes Intravenous  Once 01/19/19 2321 01/20/19 1631      Assessment: Pharmacy has been consulted for Heparin dosing on 62yo patient with a longstanding history of PAD. Patient developed sudden sever pain in his right lower extremity that was different from his normal claudification that he has 2 days ago. 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 Surgeon started heparin while in Cath lab. Baseline labs: INR 1.1, APTT 85, Hgb 11.5  7/29@1908 : HL 0.34  Goal of Therapy:  Heparin level 0.2-0.5 units/ml Monitor platelets by anticoagulation protocol: Yes   Plan:  Surgeon stated since patient had received tPA during the procedure, a bolus dose of heparin was not needed. Would like to dose patient at 10units/kg/hr initially.  07/30 @ 0130 HL 0.21 therapeutic. Will continue current rate at 600 units/hr and will recheck HL @ 0700 since level has been trending down, hgb stable will continue to monitor.  Tobie Lords, PharmD, BCPS Clinical Pharmacist 01/21/2019,3:32 AM

## 2019-01-21 NOTE — Progress Notes (Signed)
1800 Cleaned around left cath. Site. Oozing a small mount of bright red blood.PAD continues to have 20 mls of air in it per orders. Complains of severe revasculation pain. Daughter states this is normal for him to have this severe pain.

## 2019-01-21 NOTE — Progress Notes (Signed)
Down to specials for procedure.

## 2019-01-22 DIAGNOSIS — I70221 Atherosclerosis of native arteries of extremities with rest pain, right leg: Principal | ICD-10-CM

## 2019-01-22 MED ORDER — APIXABAN 5 MG PO TABS
5.0000 mg | ORAL_TABLET | Freq: Two times a day (BID) | ORAL | Status: DC
  Administered 2019-01-22 – 2019-01-23 (×3): 5 mg via ORAL
  Filled 2019-01-22 (×3): qty 1

## 2019-01-22 MED ORDER — ASPIRIN EC 81 MG PO TBEC
81.0000 mg | DELAYED_RELEASE_TABLET | Freq: Every day | ORAL | Status: DC
  Administered 2019-01-22 – 2019-01-23 (×2): 81 mg via ORAL
  Filled 2019-01-22 (×2): qty 1

## 2019-01-22 MED ORDER — HYDROCHLOROTHIAZIDE 12.5 MG PO CAPS
12.5000 mg | ORAL_CAPSULE | Freq: Every day | ORAL | Status: DC
  Administered 2019-01-22 – 2019-01-23 (×2): 12.5 mg via ORAL
  Filled 2019-01-22 (×2): qty 1

## 2019-01-22 MED ORDER — LISINOPRIL 10 MG PO TABS
10.0000 mg | ORAL_TABLET | Freq: Two times a day (BID) | ORAL | Status: DC
  Administered 2019-01-22 – 2019-01-23 (×3): 10 mg via ORAL
  Filled 2019-01-22: qty 2
  Filled 2019-01-22 (×2): qty 1

## 2019-01-22 MED ORDER — NICOTINE 21 MG/24HR TD PT24: 21.0000 mg | MEDICATED_PATCH | Freq: Every day | TRANSDERMAL | Status: DC

## 2019-01-22 MED ORDER — AMLODIPINE BESYLATE 5 MG PO TABS
2.5000 mg | ORAL_TABLET | Freq: Every day | ORAL | Status: DC
  Administered 2019-01-22 – 2019-01-23 (×2): 2.5 mg via ORAL
  Filled 2019-01-22 (×2): qty 1

## 2019-01-22 NOTE — Progress Notes (Signed)
[facility] Daily Progress Note   Subjective: 01/20/19: 1. Ultrasound guidance for vascular access left femoral artery 2. Catheter placement into right common femoral artery from left femoral approach 3. Aortogram and selective right lower extremity angiogram 4.  Catheter directed thrombolytic therapy with 8 mg of TPA instilled in the right SFA 5.  Mechanical thrombectomy to the right SFA, popliteal artery, tibioperoneal trunk, and posterior tibial arteries with the penumbra cat 6 device             6.  Catheter placement for continuous infusion of thrombolytic therapy in the right SFA, popliteal artery, tibioperoneal trunk, and proximal posterior tibial artery 7.  Left femoral venous triple-lumen catheter placement with ultrasound guidance  01/21/19: 1.  Right lower extremity angiogram through existing catheters 2. Catheter placement into right posterior tibial artery and right anterior tibial artery from left femoral approach 3.  Mechanical thrombectomy with the penumbra cat 6 device to the right SFA, popliteal artery, the tibioperoneal trunk, posterior tibial artery, and anterior tibial arteries 4. Percutaneous transluminal angioplasty of right posterior tibial artery with 2.5 mm diameter by 30 cm length angioplasty balloon 5.  Percutaneous transluminal angioplasty of the right proximal SFA with 5 mm diameter by 6 cm length Lutonix drug-coated angioplasty balloon             6.  Percutaneous transluminal angioplasty of the right anterior tibial artery with 2.5 mm diameter by 30 cm length angioplasty balloon 7. StarClose closure device left femoral artery  Patient states intermittent pain to the right calf. No issues overnight. Patient has not ambulated yet. Foley still in.   Objective: Vitals:   01/22/19 0900  01/22/19 1000 01/22/19 1100 01/22/19 1200  BP: (!) 175/81 (!) 182/84 (!) 185/86 (!) 165/83  Pulse: 91 (!) 104 93   Resp: 16 16 15 18   Temp:    98.3 F (36.8 C)  TempSrc:    Axillary  SpO2: 99% 99% 97% 99%  Weight:      Height:        Intake/Output Summary (Last 24 hours) at 01/22/2019 1448 Last data filed at 01/22/2019 1335 Gross per 24 hour  Intake 1831.42 ml  Output 2510 ml  Net -678.58 ml   Physical Exam: A&Ox3, NAD CV: RRR Pulmonary: CTA Bilaterally Abdomen: Soft, Nontender, Nondistended Left Groin:  Central Line: Intact. Clean and dry. GU: Foley intact - draining clear urine Vascular:  Right Lower Extremity: Thigh soft, calf soft. No compartment syndrome on exam. Extremity is warm distally to toes. Motor / sensory intact. No palpable / dopplerable pedal pulses on exam.    Laboratory: CBC    Component Value Date/Time   WBC 6.5 01/21/2019 1015   HGB 9.1 (L) 01/21/2019 1015   HGB 13.1 08/31/2015 0929   HCT 28.0 (L) 01/21/2019 1015   HCT 38.5 08/31/2015 0929   PLT 172 01/21/2019 1015   PLT 217 08/31/2015 0929   BMET    Component Value Date/Time   NA 138 01/21/2019 0131   NA 137 08/31/2015 0929   K 5.1 01/21/2019 0131   CL 109 01/21/2019 0131   CO2 23 01/21/2019 0131   GLUCOSE 112 (H) 01/21/2019 0131   BUN 24 (H) 01/21/2019 0131   BUN 11 08/31/2015 0929   CREATININE 1.88 (H) 01/21/2019 0131   CREATININE 1.67 (H) 11/20/2018 1601   CALCIUM 8.4 (L) 01/21/2019 0131   GFRNONAA 38 (L) 01/21/2019 0131   GFRNONAA 44 (L) 11/20/2018 1601   GFRAA 44 (  L) 01/21/2019 0131   GFRAA 50 (L) 11/20/2018 1601   Assessment/Planning: The patient is a 63 year old male with multiple medical issues including severe peripheral artery disease status post right lower extremity endovascular intervention x2 1) Aggrastat infusion stopped this a.m.  Transition to Eliquis and aspirin. 2) as per nursing exam patient had dopplerable pedal pulses this a.m. however was lost to early this  afternoon. 3) patient states intermittent soreness to the right calf however denies any significant right lower extremity pain 4) we will DC Foley 5) we will transfer out of the ICU to regular MedSurg bed 6) we will keep central line in until discharge 7) patient to work with PT this afternoon 8) plan to discharge home tomorrow  Marcelle Overlie PA-C 01/22/2019 2:48 PM

## 2019-01-22 NOTE — Progress Notes (Signed)
Pt reported incr discomfort in right calf and foot at 1200.  Rt pedal pulse no longer palpable.  Unable to obtain a doppler on pedal pulse, although a great deal of turbulence could be heard post tib, no distinct pulse however.  Dr Lucky Cowboy informed, PA Judithann Sheen will come and see patient

## 2019-01-22 NOTE — Progress Notes (Signed)
Report called to Barnabas Lister on 2C, pt transported via wheelchair, VSS on room air, daughter informed, chart and belongings transported with him

## 2019-01-22 NOTE — Evaluation (Signed)
Physical Therapy Evaluation Patient Details Name: Ryan [name] MRN: [id number] DOB: 1955-11-04 Today's Date: 01/22/2019   History of Present Illness  Pt is a 63 yo male with a PMH that includes HTN, PVD, and SOB.  Pt presented to the hospital with worsening ischemic pain at rest of the right foot and is now s/p multiple procedures for RLE revascularization.    Clinical Impression  Pt presents with deficits in strength, transfers, gait, balance, and activity tolerance.  Pt was Mod Ind with bed mobility tasks and CGA with transfers with min verbal cues for proper sequencing.  Pt was able to amb 30' with a RW with antalgic gait pattern on the RLE but was steady without LOB.  Pt's reported RLE pain was 5/10 at rest and 4-5/10 with ambulation. Pt will benefit from HHPT services upon discharge to safely address above deficits for decreased caregiver assistance and eventual return to PLOF.      Follow Up Recommendations Home health PT;Supervision for mobility/OOB    Equipment Recommendations  None recommended by PT    Recommendations for Other Services       Precautions / Restrictions Precautions Precautions: Fall Restrictions Weight Bearing Restrictions: No      Mobility  Bed Mobility Overal bed mobility: Modified Independent             General bed mobility comments: Extra time and effort and use of bed rail but no physical assistance needed  Transfers Overall transfer level: Needs assistance Equipment used: Rolling walker (2 wheeled) Transfers: Sit to/from Stand Sit to Stand: Min guard         General transfer comment: Min verbal cues for sequencing with pt steady demontrating good eccentric and concentric control  Ambulation/Gait Ambulation/Gait assistance: Min guard Gait Distance (Feet): 30 Feet Assistive device: Rolling walker (2 wheeled) Gait Pattern/deviations: Step-through pattern;Antalgic;Decreased stance time - right Gait velocity: decreased    General Gait Details: Mildly antalgic gait pattern on the RLE with amb with pt reporting no increase in RLE pain from baseline while ambulating  Stairs            Wheelchair Mobility    Modified Rankin (Stroke Patients Only)       Balance Overall balance assessment: Needs assistance   Sitting balance-Leahy Scale: Good     Standing balance support: Bilateral upper extremity supported Standing balance-Leahy Scale: Good                               Pertinent Vitals/Pain Pain Assessment: 0-10 Pain Score: 5  Pain Location: RLE Pain Descriptors / Indicators: Sore Pain Intervention(s): Monitored during session    Home Living Family/patient expects to be discharged to:: Private residence Living Arrangements: Children Available Help at Discharge: Family;Available 24 hours/day Type of Home: House Home Access: Stairs to enter Entrance Stairs-Rails: Left Entrance Stairs-Number of Steps: 4 Home Layout: Two level;Able to live on main level with bedroom/bathroom Home Equipment: Gilford Rile - 2 wheels      Prior Function Level of Independence: Independent         Comments: Ind Amb community distances without an AD, no fall history, Ind with ADLs     Hand Dominance   Dominant Hand: Right    Extremity/Trunk Assessment   Upper Extremity Assessment Upper Extremity Assessment: Overall WFL for tasks assessed    Lower Extremity Assessment Lower Extremity Assessment: Generalized weakness       Communication   Communication: No  difficulties  Cognition Arousal/Alertness: Awake/alert Behavior During Therapy: WFL for tasks assessed/performed Overall Cognitive Status: Within Functional Limits for tasks assessed                                        General Comments      Exercises Total Joint Exercises Ankle Circles/Pumps: Strengthening;Both;5 reps;10 reps Quad Sets: Strengthening;Both;5 reps;10 reps Gluteal Sets: Strengthening;5  reps;10 reps;Both Heel Slides: AROM;Left;10 reps Hip ABduction/ADduction: AROM;Left;10 reps Straight Leg Raises: AROM;Left;10 reps Long Arc Quad: AROM;Both;5 reps;10 reps Knee Flexion: AROM;Both;5 reps;10 reps Marching in Standing: AROM;Both;10 reps;Standing   Assessment/Plan    PT Assessment Patient needs continued PT services  PT Problem List Decreased strength;Decreased activity tolerance;Decreased balance;Decreased knowledge of use of DME       PT Treatment Interventions DME instruction;Gait training;Stair training;Functional mobility training;Therapeutic activities;Therapeutic exercise;Balance training;Patient/family education    PT Goals (Current goals can be found in the Care Plan section)  Acute Rehab PT Goals Patient Stated Goal: To get stronger and return home PT Goal Formulation: With patient Time For Goal Achievement: 02/04/19 Potential to Achieve Goals: Good    Frequency Min 2X/week   Barriers to discharge        Co-evaluation               AM-PAC PT "6 Clicks" Mobility  Outcome Measure Help needed turning from your back to your side while in a flat bed without using bedrails?: A Little Help needed moving from lying on your back to sitting on the side of a flat bed without using bedrails?: A Little Help needed moving to and from a bed to a chair (including a wheelchair)?: A Little Help needed standing up from a chair using your arms (e.g., wheelchair or bedside chair)?: A Little Help needed to walk in hospital room?: A Little Help needed climbing 3-5 steps with a railing? : A Little 6 Click Score: 18    End of Session Equipment Utilized During Treatment: Gait belt Activity Tolerance: Patient tolerated treatment well Patient left: in bed;with call bell/phone within reach Nurse Communication: Mobility status PT Visit Diagnosis: Muscle weakness (generalized) (M62.81);Difficulty in walking, not elsewhere classified (R26.2)    Time: 8250-5397 PT Time  Calculation (min) (ACUTE ONLY): 30 min   Charges:   PT Evaluation $PT Eval Low Complexity: 1 Low PT Treatments $Therapeutic Exercise: 8-22 mins        D. Royetta Asal PT, DPT 01/22/19, 4:15 PM

## 2019-01-22 NOTE — Plan of Care (Signed)
Patient reports no pain this AM, PAD deflated and removed at 0915, aggrastat stopped, site is Level 0.  Palpable pedal pulse in both  lower extremities, left stronger than right.

## 2019-01-23 ENCOUNTER — Encounter: Payer: Self-pay | Admitting: Surgery

## 2019-01-23 LAB — BASIC METABOLIC PANEL
Anion gap: 9 (ref 5–15)
BUN: 14 mg/dL (ref 8–23)
CO2: 25 mmol/L (ref 22–32)
Calcium: 8.5 mg/dL — ABNORMAL LOW (ref 8.9–10.3)
Chloride: 101 mmol/L (ref 98–111)
Creatinine, Ser: 1.43 mg/dL — ABNORMAL HIGH (ref 0.61–1.24)
GFR calc Af Amer: >60 mL/min (ref >60)
GFR calc non Af Amer: 52 mL/min — ABNORMAL LOW (ref >60)
Glucose, Bld: 101 mg/dL — ABNORMAL HIGH (ref 70–99)
Potassium: 4.3 mmol/L (ref 3.5–5.1)
Sodium: 135 mmol/L (ref 135–145)

## 2019-01-23 LAB — MAGNESIUM: Magnesium: 2 mg/dL (ref 1.7–2.4)

## 2019-01-23 LAB — CBC
HCT: 22.8 % — ABNORMAL LOW (ref 39.0–52.0)
Hemoglobin: 7.7 g/dL — ABNORMAL LOW (ref 13.0–17.0)
MCH: 30.2 pg (ref 26.0–34.0)
MCHC: 33.8 g/dL (ref 30.0–36.0)
MCV: 89.4 fL (ref 80.0–100.0)
Platelets: 156 10*3/uL (ref 150–400)
RBC: 2.55 MIL/uL — ABNORMAL LOW (ref 4.22–5.81)
RDW: 15 % (ref 11.5–15.5)
WBC: 6.5 10*3/uL (ref 4.0–10.5)
nRBC: 0 % (ref 0.0–0.2)

## 2019-01-23 MED ORDER — LISINOPRIL 10 MG PO TABS: 10.0000 mg | ORAL_TABLET | Freq: Every day | ORAL | 0 refills | Status: DC

## 2019-01-23 NOTE — Clinical Social Work Note (Signed)
Went by room to discuss home health. Patient was on the phone and asked CSW to come back later.   Ryan [name], [place]

## 2019-01-23 NOTE — TOC Initial Note (Signed)
Transition of Care [facility]) - Initial/Assessment Note    Patient Details  Name: Ryan [name] MRN: [id number] Date of Birth: February 08, 1956  [facility]) CM/SW Contact:    Ryan Chroman, LCSW Phone Number: 01/23/2019, 12:49 PM  Clinical Narrative: CSW met with patient, introduced role, and explained that PT recommendations would be discussed. Patient agreeable to HHPT. Preferences are Ryan [name] (not in network), Ryan [name] (Patent attorney for representative), and Ryan [name] (checking his insurance to see if they are in network). Ryan [name] is able to take him for PT if not. Patient requesting a rolling walker when he goes home. No further concerns. CSW encouraged patient to contact CSW as needed. CSW will continue to follow patient for support and facilitate return home once stable.                 Expected Discharge Plan: Ryan [name] Barriers to Discharge: Continued Medical Work up   Patient Goals and CMS Choice   CMS Medicare.gov Compare Post Acute Care list provided to:: Patient    Expected Discharge Plan and Services Expected Discharge Plan: [place]     Post Acute Care Choice: Home Health, Durable Medical Equipment Living arrangements for the past 2 months: Single Family Home                           HH Arranged: PT          Prior Living Arrangements/Services Living arrangements for the past 2 months: Single Family Home Lives with:: Adult Children Patient language and need for interpreter reviewed:: Yes Do you feel safe going back to the place where you live?: Yes      Need for Family Participation in Patient Care: Yes (Comment) Care giver support system in place?: Yes (comment)   Criminal Activity/Legal Involvement Pertinent to Current Situation/Hospitalization: No - Comment as needed  Activities of Daily Living Home Assistive Devices/Equipment: None ADL Screening (condition at time of admission) Patient's cognitive ability  adequate to safely complete daily activities?: Yes Is the patient deaf or have difficulty hearing?: No Does the patient have difficulty seeing, even when wearing glasses/contacts?: No Does the patient have difficulty concentrating, remembering, or making decisions?: No Patient able to express need for assistance with ADLs?: Yes Does the patient have difficulty dressing or bathing?: No Independently performs ADLs?: Yes (appropriate for developmental age) Does the patient have difficulty walking or climbing stairs?: No Weakness of Legs: None Weakness of Arms/Hands: None  Permission Sought/Granted Permission sought to share information with : Facility Art therapist granted to share information with : Yes, Verbal Permission Granted     Permission granted to share info w AGENCY: Home Health Agencies        Emotional Assessment Appearance:: Appears stated age Attitude/Demeanor/Rapport: Engaged, Gracious Affect (typically observed): Accepting, Appropriate, Calm, Pleasant Orientation: : Oriented to Self, Oriented to Place, Oriented to  Time, Oriented to Situation Alcohol / Substance Use: Tobacco Use Psych Involvement: No (comment)  Admission diagnosis:  Ischemic leg [I99.8] Patient Active Problem List   Diagnosis Date Noted  . Iron deficiency anemia 11/23/2018  . Stage 3 chronic kidney disease ([place]) 10/21/2018  . Atherosclerosis of native arteries of extremity with rest pain ([place]) 10/13/2018  . PAD (peripheral artery disease) ([place]) 07/21/2018  . Ischemic leg 06/25/2018  . Claudication ([place]) 03/17/2018  . B12 deficiency 03/12/2018  . Vitamin B1 deficiency 03/12/2018  . Varicose veins of leg with  pain, right 03/12/2018  . Enlarged thoracic aorta ([place]) 02/24/2018  . Alcoholism /alcohol abuse ([place]) 02/24/2018  . Paresthesia 02/24/2018  . Ectatic aorta ([place]) 02/20/2018  . Emphysema lung ([place]) 02/20/2018  . Atherosclerosis of aorta ([place]) 02/20/2018  . Encounter for  tobacco use cessation counseling   . Benign neoplasm of cecum   . Benign neoplasm of transverse colon   . Benign neoplasm of sigmoid colon   . Hypertension, benign 08/31/2015  . Tobacco use 08/31/2015  . Abnormal CXR 08/31/2015   PCP:  Ryan Sizer, MD Pharmacy:   [facility] [address] [place] [address] [place]) [place] [address] Phone: [phone] Fax: [phone]     Social Determinants of Health (SDOH) Interventions    Readmission Risk Interventions No flowsheet data found.

## 2019-01-23 NOTE — Progress Notes (Signed)
2 Days Post-Op   Subjective/Chief Complaint: Patient is doing well at rest.  He complains of short distance claudication from getting up and going to the bathroom.   Objective: Vital signs in last 24 hours: Temp:  [98 F (36.7 C)-98.7 F (37.1 C)] 98 F (36.7 C) (08/01 0318) Pulse Rate:  [84-113] 84 (08/01 0318) Resp:  [15-25] 16 (08/01 0318) BP: (155-182)/(77-95) 166/77 (08/01 0318) SpO2:  [99 %-100 %] 100 % (08/01 0318) Last BM Date: 01/20/19  Intake/Output from previous day: 07/31 0701 - 08/01 0700 In: 1091 [P.O.:1080; I.V.:11] Out: 2810 [Urine:2810] Intake/Output this shift: Total I/O In: 240 [P.O.:240] Out: 300 [Urine:300]  General appearance: alert, cooperative, appears older than stated age and no distress Head: Normocephalic, without obvious abnormality, atraumatic Eyes: conjunctivae/corneas clear. PERRL, EOM's intact. Fundi benign. Neck: no adenopathy, no carotid bruit, no JVD, supple, symmetrical, trachea midline and thyroid not enlarged, symmetric, no tenderness/mass/nodules Back: symmetric, no curvature. ROM normal. No CVA tenderness. Resp: clear to auscultation bilaterally Chest wall: no tenderness Extremities: extremities normal, atraumatic, no cyanosis or edema Pulses: 2+ and symmetric Doppler signals in the left foot over the posterior tibial and dorsalis pedis are mono to biphasic.  The right posterior tibial is monophasic and decreased compared to the left foot.  The patient's right toes have sensation and motor intact the patient's left foot is unremarkable.  No wounds are noted in the right foot or the left foot. Incision/Wound:  Lab Results:  Recent Labs    01/21/19 1015 01/23/19 0552  WBC 6.5 6.5  HGB 9.1* 7.7*  HCT 28.0* 22.8*  PLT 172 156   BMET Recent Labs    01/21/19 0131 01/23/19 0552  NA 138 135  K 5.1 4.3  CL 109 101  CO2 23 25  GLUCOSE 112* 101*  BUN 24* 14  CREATININE 1.88* 1.43*  CALCIUM 8.4* 8.5*   PT/INR Recent Labs   01/20/19 1631  LABPROT 14.5  INR 1.1   ABG No results for input(s): PHART, HCO3 in the last 72 hours.  Invalid input(s): PCO2, PO2  Studies/Results: No results found.  Anti-infectives: Anti-infectives (From admission, onward)   Start     Dose/Rate Route Frequency Ordered Stop   01/21/19 0815  ceFAZolin (ANCEF) IVPB 2g/100 mL premix  Status:  Discontinued     2 g 200 mL/hr over 30 Minutes Intravenous  Once 01/21/19 0810 01/21/19 0901   01/21/19 0806  ceFAZolin (ANCEF) 2-4 GM/100ML-% IVPB    Note to Pharmacy: Despina Arias  : cabinet override      01/21/19 0806 01/21/19 0806   01/20/19 1515  ceFAZolin (ANCEF) IVPB 2g/100 mL premix  Status:  Discontinued     2 g 200 mL/hr over 30 Minutes Intravenous  Once 01/20/19 1512 01/20/19 1620   01/19/19 2330  ceFAZolin (ANCEF) IVPB 2g/100 mL premix     2 g 200 mL/hr over 30 Minutes Intravenous  Once 01/19/19 2321 01/20/19 1631      Assessment/Plan: s/p Procedure(s): Lower Extremity Angiography (Right) Discharge The patient will need to do routine exercises of the right and left lower extremity to continue to improve collateral blood circulation.  I will have physical therapy assess the patient and educate the patient with specific exercises to improve his arterial blood flow.  LOS: 3 days    Elmore Guise 01/23/2019

## 2019-01-23 NOTE — Progress Notes (Signed)
01/23/2019 7:02 PM  Ryan [name] to be D/C'd Home per MD order.  Discussed prescriptions and follow up appointments with the patient. Prescriptions given to patient, medication list explained in detail. Pt verbalized understanding.  Allergies as of 01/23/2019   No Known Allergies     Medication List    STOP taking these medications   hydrochlorothiazide 12.5 MG tablet Commonly known as: HYDRODIURIL   HYDROcodone-acetaminophen 5-325 MG tablet Commonly known as: Norco     TAKE these medications   amLODipine 2.5 MG tablet Commonly known as: NORVASC Take 1 tablet (2.5 mg total) by mouth daily. Notes to patient: Morning 01/24/19   apixaban 5 MG Tabs tablet Commonly known as: ELIQUIS Take 1 tablet (5 mg total) by mouth 2 (two) times daily. Notes to patient: Before bed 01/23/19   aspirin EC 81 MG tablet Take 1 tablet (81 mg total) by mouth daily. Notes to patient: Morning 01/24/19   atorvastatin 20 MG tablet Commonly known as: LIPITOR Take 1 tablet (20 mg total) by mouth daily. Notes to patient: Morning 01/24/19   Fluticasone-Umeclidin-Vilant 100-62.5-25 MCG/INH Aepb Commonly known as: Trelegy Ellipta Inhale 1 puff into the lungs daily. Notes to patient: Morning 01/24/19   ibuprofen 200 MG tablet Commonly known as: ADVIL Take 200 mg by mouth every 4 (four) hours as needed. Notes to patient: As needed   lisinopril 10 MG tablet Commonly known as: ZESTRIL Take 1 tablet (10 mg total) by mouth daily. What changed: when to take this Notes to patient: Morning 01/24/19   thiamine 50 MG tablet Commonly known as: VITAMIN B-1 Take 1 tablet (50 mg total) by mouth daily. Notes to patient: Morning 01/24/19   Vitamin B-12 1000 MCG Subl Place 1 tablet (1,000 mcg total) under the tongue daily. Notes to patient: Morning 01/24/19            Durable Medical Equipment  (From admission, onward)         Start     Ordered   01/23/19 1826  For home use only DME Cane  Once     01/23/19  1826   01/23/19 1608  For home use only DME Walker rolling  Once    Question:  Patient needs a walker to treat with the following condition  Answer:  Weakness   01/23/19 1607          Vitals:   01/22/19 2011 01/23/19 0318  BP: (!) 176/92 (!) 166/77  Pulse: 91 84  Resp: 16 16  Temp: 98.7 F (37.1 C) 98 F (36.7 C)  SpO2: 99% 100%    Skin clean, dry and intact without evidence of skin break down, no evidence of skin tears noted. IV catheter discontinued intact. Site without signs and symptoms of complications. Dressing and pressure applied. Pt denies pain at this time. No complaints noted.  An After Visit Summary was printed and given to the patient. Patient escorted via [place], and D/C home via private auto.  Dola Argyle

## 2019-01-24 NOTE — Care Management (Signed)
Previous TOc team member had set up home health via [facility]. Due to out of network coverage his preferred agencies could not accept the referral. Patient has ordered for rolling walker it was delivered to bedside but patient now feels he only needs a cane. DME orders in for cane and we will swap out walker for cane via adapt.

## 2019-01-27 ENCOUNTER — Telehealth (INDEPENDENT_AMBULATORY_CARE_PROVIDER_SITE_OTHER): Payer: Self-pay | Admitting: Nurse Practitioner

## 2019-01-27 ENCOUNTER — Other Ambulatory Visit (INDEPENDENT_AMBULATORY_CARE_PROVIDER_SITE_OTHER): Payer: Self-pay | Admitting: Nurse Practitioner

## 2019-01-27 NOTE — Telephone Encounter (Signed)
Patient has been schedule.

## 2019-01-28 ENCOUNTER — Inpatient Hospital Stay
Admission: RE | Admit: 2019-01-28 | Discharge: 2019-01-30 | DRG: 271 | Disposition: A | Discharge status: Home / Self Care | Payer: BC Managed Care – PPO | Source: Ambulatory Visit | Attending: Vascular Surgery | Admitting: Vascular Surgery

## 2019-01-28 ENCOUNTER — Encounter
Admission: RE | Disposition: A | Discharge status: Home / Self Care | Payer: Self-pay | Source: Ambulatory Visit | Attending: Vascular Surgery

## 2019-01-28 ENCOUNTER — Encounter (INDEPENDENT_AMBULATORY_CARE_PROVIDER_SITE_OTHER): Payer: Self-pay | Admitting: Nurse Practitioner

## 2019-01-28 ENCOUNTER — Ambulatory Visit (INDEPENDENT_AMBULATORY_CARE_PROVIDER_SITE_OTHER): Payer: BC Managed Care – PPO

## 2019-01-28 ENCOUNTER — Other Ambulatory Visit (INDEPENDENT_AMBULATORY_CARE_PROVIDER_SITE_OTHER): Payer: Self-pay | Admitting: Vascular Surgery

## 2019-01-28 ENCOUNTER — Ambulatory Visit (INDEPENDENT_AMBULATORY_CARE_PROVIDER_SITE_OTHER): Payer: BC Managed Care – PPO | Admitting: Nurse Practitioner

## 2019-01-28 ENCOUNTER — Other Ambulatory Visit: Payer: Self-pay

## 2019-01-28 ENCOUNTER — Other Ambulatory Visit (INDEPENDENT_AMBULATORY_CARE_PROVIDER_SITE_OTHER): Payer: Self-pay | Admitting: Nurse Practitioner

## 2019-01-28 ENCOUNTER — Encounter: Payer: Self-pay | Admitting: *Deleted

## 2019-01-28 ENCOUNTER — Encounter (INDEPENDENT_AMBULATORY_CARE_PROVIDER_SITE_OTHER): Payer: Self-pay

## 2019-01-28 ENCOUNTER — Other Ambulatory Visit
Admission: RE | Admit: 2019-01-28 | Discharge: 2019-01-28 | Disposition: A | Discharge status: Home / Self Care | Payer: BC Managed Care – PPO | Source: Ambulatory Visit | Attending: Vascular Surgery | Admitting: Vascular Surgery

## 2019-01-28 VITALS — BP 130/84 | HR 94 | Resp 16

## 2019-01-28 DIAGNOSIS — Z9862 Peripheral vascular angioplasty status: Secondary | ICD-10-CM

## 2019-01-28 DIAGNOSIS — I998 Other disorder of circulatory system: Secondary | ICD-10-CM | POA: Diagnosis present

## 2019-01-28 DIAGNOSIS — E785 Hyperlipidemia, unspecified: Secondary | ICD-10-CM | POA: Diagnosis present

## 2019-01-28 DIAGNOSIS — J438 Other emphysema: Secondary | ICD-10-CM

## 2019-01-28 DIAGNOSIS — Z20828 Contact with and (suspected) exposure to other viral communicable diseases: Secondary | ICD-10-CM | POA: Diagnosis not present

## 2019-01-28 DIAGNOSIS — T82868A Thrombosis of vascular prosthetic devices, implants and grafts, initial encounter: Secondary | ICD-10-CM | POA: Diagnosis not present

## 2019-01-28 DIAGNOSIS — Z720 Tobacco use: Secondary | ICD-10-CM | POA: Diagnosis not present

## 2019-01-28 DIAGNOSIS — I10 Essential (primary) hypertension: Secondary | ICD-10-CM | POA: Diagnosis present

## 2019-01-28 DIAGNOSIS — M79661 Pain in right lower leg: Secondary | ICD-10-CM | POA: Diagnosis not present

## 2019-01-28 DIAGNOSIS — M79606 Pain in leg, unspecified: Secondary | ICD-10-CM

## 2019-01-28 DIAGNOSIS — I70221 Atherosclerosis of native arteries of extremities with rest pain, right leg: Secondary | ICD-10-CM | POA: Diagnosis not present

## 2019-01-28 DIAGNOSIS — I743 Embolism and thrombosis of arteries of the lower extremities: Secondary | ICD-10-CM | POA: Diagnosis not present

## 2019-01-28 HISTORY — PX: LOWER EXTREMITY ANGIOGRAPHY: CATH118251

## 2019-01-28 LAB — CBC
HCT: 28.9 % — ABNORMAL LOW (ref 39.0–52.0)
HCT: 28.9 % — ABNORMAL LOW (ref 39.0–52.0)
HCT: 25.9 % — ABNORMAL LOW (ref 39.0–52.0)
Hemoglobin: 9.4 g/dL — ABNORMAL LOW (ref 13.0–17.0)
Hemoglobin: 9.2 g/dL — ABNORMAL LOW (ref 13.0–17.0)
Hemoglobin: 8.6 g/dL — ABNORMAL LOW (ref 13.0–17.0)
MCH: 30.3 pg (ref 26.0–34.0)
MCH: 29.7 pg (ref 26.0–34.0)
MCH: 30.1 pg (ref 26.0–34.0)
MCHC: 32.5 g/dL (ref 30.0–36.0)
MCHC: 31.8 g/dL (ref 30.0–36.0)
MCHC: 33.2 g/dL (ref 30.0–36.0)
MCV: 93.2 fL (ref 80.0–100.0)
MCV: 93.2 fL (ref 80.0–100.0)
MCV: 90.6 fL (ref 80.0–100.0)
Platelets: 488 10*3/uL — ABNORMAL HIGH (ref 150–400)
Platelets: 465 10*3/uL — ABNORMAL HIGH (ref 150–400)
Platelets: 374 10*3/uL (ref 150–400)
RBC: 3.1 MIL/uL — ABNORMAL LOW (ref 4.22–5.81)
RBC: 3.1 MIL/uL — ABNORMAL LOW (ref 4.22–5.81)
RBC: 2.86 MIL/uL — ABNORMAL LOW (ref 4.22–5.81)
RDW: 14.6 % (ref 11.5–15.5)
RDW: 14.7 % (ref 11.5–15.5)
RDW: 14.6 % (ref 11.5–15.5)
WBC: 7.9 10*3/uL (ref 4.0–10.5)
WBC: 9.3 10*3/uL (ref 4.0–10.5)
WBC: 9.9 10*3/uL (ref 4.0–10.5)
nRBC: 0 % (ref 0.0–0.2)
nRBC: 0 % (ref 0.0–0.2)
nRBC: 0 % (ref 0.0–0.2)

## 2019-01-28 LAB — FIBRINOGEN
Fibrinogen: 634 mg/dL — ABNORMAL HIGH (ref 210–475)
Fibrinogen: 317 mg/dL (ref 210–475)

## 2019-01-28 LAB — GLUCOSE, CAPILLARY: Glucose-Capillary: 86 mg/dL (ref 70–99)

## 2019-01-28 LAB — CREATININE, SERUM
Creatinine, Ser: 1.56 mg/dL — ABNORMAL HIGH (ref 0.61–1.24)
GFR calc Af Amer: 55 mL/min — ABNORMAL LOW (ref >60)
GFR calc non Af Amer: 47 mL/min — ABNORMAL LOW (ref >60)

## 2019-01-28 LAB — HEPARIN LEVEL (UNFRACTIONATED)
Heparin Unfractionated: 2.84 IU/mL — ABNORMAL HIGH (ref 0.30–0.70)
Heparin Unfractionated: 2.18 IU/mL — ABNORMAL HIGH (ref 0.30–0.70)

## 2019-01-28 LAB — BUN: BUN: 22 mg/dL (ref 8–23)

## 2019-01-28 LAB — TYPE AND SCREEN
ABO/RH(D): A POS
Antibody Screen: NEGATIVE

## 2019-01-28 LAB — SARS CORONAVIRUS 2 BY RT PCR (HOSPITAL ORDER, PERFORMED IN ~~LOC~~ HOSPITAL LAB): SARS Coronavirus 2: NEGATIVE

## 2019-01-28 SURGERY — LOWER EXTREMITY ANGIOGRAPHY
Anesthesia: Moderate Sedation | Laterality: Right

## 2019-01-28 SURGERY — LOWER EXTREMITY ANGIOGRAPHY
Anesthesia: Moderate Sedation | Site: Leg Lower | Laterality: Right

## 2019-01-28 MED ORDER — SODIUM CHLORIDE 0.9% FLUSH: 3.0000 mL | INTRAVENOUS | Status: DC | PRN

## 2019-01-28 MED ORDER — HYDROMORPHONE HCL 1 MG/ML IJ SOLN
1.0000 mg | INTRAMUSCULAR | Status: AC | PRN
  Administered 2019-01-28: 1 mg via INTRAVENOUS
  Filled 2019-01-28: qty 1

## 2019-01-28 MED ORDER — LIDOCAINE-EPINEPHRINE (PF) 1 %-1:200000 IJ SOLN
INTRAMUSCULAR | Status: AC
  Filled 2019-01-28: qty 30

## 2019-01-28 MED ORDER — FLUTICASONE FUROATE-VILANTEROL 100-25 MCG/INH IN AEPB
1.0000 | INHALATION_SPRAY | Freq: Every day | RESPIRATORY_TRACT | Status: DC
  Administered 2019-01-29 – 2019-01-30 (×2): 1 via RESPIRATORY_TRACT
  Filled 2019-01-28: qty 28

## 2019-01-28 MED ORDER — HYDRALAZINE HCL 20 MG/ML IJ SOLN
INTRAMUSCULAR | Status: AC
  Administered 2019-01-28: 16:00:00 10 mg via INTRAVENOUS
  Filled 2019-01-28: qty 1

## 2019-01-28 MED ORDER — HEPARIN SODIUM (PORCINE) 1000 UNIT/ML IJ SOLN
INTRAMUSCULAR | Status: AC
  Filled 2019-01-28: qty 1

## 2019-01-28 MED ORDER — HYDROMORPHONE HCL 1 MG/ML IJ SOLN
1.0000 mg | Freq: Once | INTRAMUSCULAR | Status: AC | PRN
  Administered 2019-01-28: 17:00:00 1 mg via INTRAVENOUS

## 2019-01-28 MED ORDER — MORPHINE SULFATE (PF) 2 MG/ML IV SOLN
INTRAVENOUS | Status: AC
  Filled 2019-01-28: qty 1

## 2019-01-28 MED ORDER — MIDAZOLAM HCL 5 MG/5ML IJ SOLN
INTRAMUSCULAR | Status: AC
  Filled 2019-01-28: qty 5

## 2019-01-28 MED ORDER — HEPARIN (PORCINE) 25000 UT/250ML-% IV SOLN
650.0000 [IU]/h | INTRAVENOUS | Status: DC
  Administered 2019-01-28: 16:00:00 600 [IU]/h via INTRAVENOUS

## 2019-01-28 MED ORDER — HEPARIN (PORCINE) 25000 UT/250ML-% IV SOLN
INTRAVENOUS | Status: AC
  Administered 2019-01-28: 16:00:00 600 [IU]/h via INTRAVENOUS
  Filled 2019-01-28: qty 250

## 2019-01-28 MED ORDER — HYDROMORPHONE HCL 1 MG/ML IJ SOLN
INTRAMUSCULAR | Status: AC
  Administered 2019-01-28: 1 mg via INTRAVENOUS
  Filled 2019-01-28: qty 1

## 2019-01-28 MED ORDER — OXYCODONE HCL 5 MG PO TABS
10.0000 mg | ORAL_TABLET | ORAL | Status: DC | PRN
  Administered 2019-01-28 – 2019-01-30 (×3): 10 mg via ORAL
  Filled 2019-01-28 (×3): qty 2

## 2019-01-28 MED ORDER — HYDRALAZINE HCL 20 MG/ML IJ SOLN
10.0000 mg | Freq: Once | INTRAMUSCULAR | Status: AC
  Administered 2019-01-28: 16:00:00 10 mg via INTRAVENOUS

## 2019-01-28 MED ORDER — OXYCODONE HCL 5 MG PO TABS: 5.0000 mg | ORAL_TABLET | ORAL | Status: DC | PRN

## 2019-01-28 MED ORDER — SODIUM CHLORIDE 0.9 % IV SOLN
INTRAVENOUS | Status: DC
  Administered 2019-01-28: 15:00:00 via INTRAVENOUS

## 2019-01-28 MED ORDER — SODIUM CHLORIDE 0.9 % IV SOLN: INTRAVENOUS | Status: DC

## 2019-01-28 MED ORDER — FENTANYL CITRATE (PF) 100 MCG/2ML IJ SOLN
INTRAMUSCULAR | Status: DC | PRN
  Administered 2019-01-28: 50 ug via INTRAVENOUS
  Administered 2019-01-28: 25 ug via INTRAVENOUS

## 2019-01-28 MED ORDER — LISINOPRIL 5 MG PO TABS
10.0000 mg | ORAL_TABLET | Freq: Every day | ORAL | Status: DC
  Administered 2019-01-29 – 2019-01-30 (×2): 10 mg via ORAL
  Filled 2019-01-28: qty 1
  Filled 2019-01-28 (×2): qty 2
  Filled 2019-01-28: qty 1

## 2019-01-28 MED ORDER — HEPARIN SODIUM (PORCINE) 1000 UNIT/ML IJ SOLN
INTRAMUSCULAR | Status: DC | PRN
  Administered 2019-01-28: 3000 [IU] via INTRAVENOUS

## 2019-01-28 MED ORDER — ONDANSETRON HCL 4 MG/2ML IJ SOLN: 4.0000 mg | Freq: Four times a day (QID) | INTRAMUSCULAR | Status: DC | PRN

## 2019-01-28 MED ORDER — SODIUM CHLORIDE 0.9 % IV SOLN
1.0000 mg/h | INTRAVENOUS | Status: AC
  Administered 2019-01-28: 16:00:00 1 mg/h
  Filled 2019-01-28: qty 10

## 2019-01-28 MED ORDER — METHYLPREDNISOLONE SODIUM SUCC 125 MG IJ SOLR: 125.0000 mg | Freq: Once | INTRAMUSCULAR | Status: DC | PRN

## 2019-01-28 MED ORDER — FLUTICASONE-UMECLIDIN-VILANT 100-62.5-25 MCG/INH IN AEPB: 1.0000 | INHALATION_SPRAY | Freq: Every day | RESPIRATORY_TRACT | Status: DC

## 2019-01-28 MED ORDER — LABETALOL HCL 5 MG/ML IV SOLN
INTRAVENOUS | Status: AC
  Administered 2019-01-28: 10 mg via INTRAVENOUS
  Filled 2019-01-28: qty 4

## 2019-01-28 MED ORDER — LABETALOL HCL 5 MG/ML IV SOLN
10.0000 mg | Freq: Once | INTRAVENOUS | Status: AC
  Administered 2019-01-28: 16:00:00 10 mg via INTRAVENOUS

## 2019-01-28 MED ORDER — FAMOTIDINE 20 MG PO TABS: 40.0000 mg | ORAL_TABLET | Freq: Once | ORAL | Status: DC | PRN

## 2019-01-28 MED ORDER — IODIXANOL 320 MG/ML IV SOLN
INTRAVENOUS | Status: DC | PRN
  Administered 2019-01-28: 1836 mL via INTRA_ARTERIAL

## 2019-01-28 MED ORDER — MIDAZOLAM HCL 2 MG/2ML IJ SOLN
INTRAMUSCULAR | Status: DC | PRN
  Administered 2019-01-28: 2 mg via INTRAVENOUS
  Administered 2019-01-28: 1 mg via INTRAVENOUS

## 2019-01-28 MED ORDER — CEFAZOLIN SODIUM-DEXTROSE 1-4 GM/50ML-% IV SOLN
1.0000 g | Freq: Once | INTRAVENOUS | Status: AC
  Administered 2019-01-28: 16:00:00 1 g via INTRAVENOUS

## 2019-01-28 MED ORDER — HYDRALAZINE HCL 20 MG/ML IJ SOLN
10.0000 mg | INTRAMUSCULAR | Status: DC | PRN
  Administered 2019-01-30: 10 mg via INTRAVENOUS
  Filled 2019-01-28: qty 1

## 2019-01-28 MED ORDER — SODIUM CHLORIDE FLUSH 0.9 % IV SOLN
INTRAVENOUS | Status: AC
  Filled 2019-01-28: qty 10

## 2019-01-28 MED ORDER — CEFAZOLIN SODIUM-DEXTROSE 1-4 GM/50ML-% IV SOLN
INTRAVENOUS | Status: AC
  Filled 2019-01-28: qty 50

## 2019-01-28 MED ORDER — SODIUM CHLORIDE 0.9% FLUSH
3.0000 mL | Freq: Two times a day (BID) | INTRAVENOUS | Status: DC
  Administered 2019-01-28 – 2019-01-30 (×4): 3 mL via INTRAVENOUS

## 2019-01-28 MED ORDER — MIDAZOLAM HCL 2 MG/ML PO SYRP: 8.0000 mg | ORAL_SOLUTION | Freq: Once | ORAL | Status: DC | PRN

## 2019-01-28 MED ORDER — FENTANYL CITRATE (PF) 100 MCG/2ML IJ SOLN
INTRAMUSCULAR | Status: AC
  Filled 2019-01-28: qty 2

## 2019-01-28 MED ORDER — VITAMIN B-12 1000 MCG PO TABS
1000.0000 ug | ORAL_TABLET | Freq: Every day | ORAL | Status: DC
  Administered 2019-01-29 – 2019-01-30 (×2): 1000 ug via ORAL
  Filled 2019-01-28 (×2): qty 1

## 2019-01-28 MED ORDER — HYDROMORPHONE HCL 1 MG/ML IJ SOLN
1.0000 mg | INTRAMUSCULAR | Status: DC | PRN
  Administered 2019-01-29 (×2): 1 mg via INTRAVENOUS
  Filled 2019-01-28 (×2): qty 1

## 2019-01-28 MED ORDER — DIPHENHYDRAMINE HCL 50 MG/ML IJ SOLN: 50.0000 mg | Freq: Once | INTRAMUSCULAR | Status: DC | PRN

## 2019-01-28 MED ORDER — MIDAZOLAM HCL 2 MG/2ML IJ SOLN: 1.0000 mg | INTRAMUSCULAR | Status: DC | PRN

## 2019-01-28 MED ORDER — AMLODIPINE BESYLATE 5 MG PO TABS
2.5000 mg | ORAL_TABLET | Freq: Every day | ORAL | Status: DC
  Administered 2019-01-29 – 2019-01-30 (×2): 2.5 mg via ORAL
  Filled 2019-01-28 (×2): qty 1

## 2019-01-28 MED ORDER — CHLORHEXIDINE GLUCONATE CLOTH 2 % EX PADS
6.0000 | MEDICATED_PAD | Freq: Every day | CUTANEOUS | Status: DC
  Administered 2019-01-29 – 2019-01-30 (×2): 6 via TOPICAL

## 2019-01-28 MED ORDER — ATORVASTATIN CALCIUM 20 MG PO TABS
20.0000 mg | ORAL_TABLET | Freq: Every day | ORAL | Status: DC
  Administered 2019-01-29 – 2019-01-30 (×2): 20 mg via ORAL
  Filled 2019-01-28 (×2): qty 1

## 2019-01-28 MED ORDER — MORPHINE SULFATE (PF) 4 MG/ML IV SOLN
5.0000 mg | INTRAVENOUS | Status: DC | PRN
  Administered 2019-01-28: 2 mg via INTRAVENOUS
  Administered 2019-01-28 (×2): 5 mg via INTRAVENOUS
  Administered 2019-01-29: 03:00:00 4 mg via INTRAVENOUS
  Filled 2019-01-28 (×3): qty 2

## 2019-01-28 MED ORDER — UMECLIDINIUM BROMIDE 62.5 MCG/INH IN AEPB
1.0000 | INHALATION_SPRAY | Freq: Every day | RESPIRATORY_TRACT | Status: DC
  Administered 2019-01-29 – 2019-01-30 (×2): 1 via RESPIRATORY_TRACT
  Filled 2019-01-28: qty 7

## 2019-01-28 MED ORDER — SODIUM CHLORIDE 0.9 % IV SOLN
250.0000 mL | INTRAVENOUS | Status: DC | PRN
  Administered 2019-01-29: 15:00:00 250 mL via INTRAVENOUS

## 2019-01-28 MED ORDER — SODIUM CHLORIDE 0.9 % IV SOLN
0.5000 mg/h | INTRAVENOUS | Status: DC
  Administered 2019-01-28 – 2019-01-29 (×2): 0.5 mg/h
  Filled 2019-01-28 (×4): qty 10

## 2019-01-28 SURGICAL SUPPLY — 9 items
CATH BEACON 5 .035 65 RIM TIP (CATHETERS) ×2 IMPLANT
CATH INFUS 90CMX50CM (CATHETERS) ×1
CATH INFUS UNIFUSE 90X50 5FR (CATHETERS) ×1 IMPLANT
COVER PROBE U/S 5X48 (MISCELLANEOUS) ×2 IMPLANT
GLIDEWIRE ADV .035X180CM (WIRE) ×2 IMPLANT
PACK ANGIOGRAPHY (CUSTOM PROCEDURE TRAY) ×2 IMPLANT
SHEATH BRITE TIP 6FR X 23 (SHEATH) ×2 IMPLANT
SHEATH PINNACLE ST 6F 45CM (SHEATH) IMPLANT
WIRE MAGIC TORQUE 260C (WIRE) ×2 IMPLANT

## 2019-01-28 NOTE — Consult Note (Signed)
ANTICOAGULATION CONSULT NOTE - Initial Consult  Pharmacy Consult for Heparin Indication: VTE Treatment. Maintain heparin level 0.2 - 0.5 units.mL  No Known Allergies  Patient Measurements:   Heparin Dosing Weight: 61.2 kg  Vital Signs: Temp: 97.7 F (36.5 C) (08/06 1514) Temp Source: Oral (08/06 1514) BP: 158/113 (08/06 1514) Pulse Rate: 88 (08/06 1514)  Labs: Recent Labs    01/28/19 1503  HGB 9.4*  HCT 28.9*  PLT 488*    Estimated Creatinine Clearance: 47 mL/min (A) (by C-G formula based on SCr of 1.43 mg/dL (H)).   Medical History: Past Medical History:  Diagnosis Date  . Hyperlipidemia   . Hypertension   . Neuromuscular disorder (HCC)    numbness feet  . Peripheral vascular disease ([place])   . Shortness of breath dyspnea     Medications:  Facility-Administered Medications Prior to Admission  Medication Dose Route Frequency Provider Last Rate Last Dose  . oxyCODONE (Oxy IR/ROXICODONE) immediate release tablet 5 mg  5 mg Oral Q4H PRN Stegmayer, Kimberly A, PA-C       Medications Prior to Admission  Medication Sig Dispense Refill Last Dose  . amLODipine (NORVASC) 2.5 MG tablet Take 1 tablet (2.5 mg total) by mouth daily. 30 tablet 0 01/28/2019 at Unknown time  . apixaban (ELIQUIS) 5 MG TABS tablet Take 1 tablet (5 mg total) by mouth 2 (two) times daily. 180 tablet 3 01/28/2019 at Unknown time  . aspirin EC 81 MG tablet Take 1 tablet (81 mg total) by mouth daily. 90 tablet 3 01/28/2019 at Unknown time  . atorvastatin (LIPITOR) 20 MG tablet Take 1 tablet (20 mg total) by mouth daily. 90 tablet 3 01/27/2019 at Unknown time  . Cyanocobalamin (VITAMIN B-12) 1000 MCG SUBL Place 1 tablet (1,000 mcg total) under the tongue daily. 90 tablet 0 01/27/2019 at Unknown time  . Fluticasone-Umeclidin-Vilant (TRELEGY ELLIPTA) 100-62.5-25 MCG/INH AEPB Inhale 1 puff into the lungs daily. 60 each 2 01/27/2019 at Unknown time  . ibuprofen (ADVIL) 200 MG tablet Take 200 mg by mouth every 4 (four)  hours as needed.   Past Month at Unknown time  . lisinopril (ZESTRIL) 10 MG tablet Take 1 tablet (10 mg total) by mouth daily. 180 tablet 0 01/27/2019 at Unknown time  . thiamine (VITAMIN B-1) 50 MG tablet Take 1 tablet (50 mg total) by mouth daily. (Patient not taking: Reported on 01/28/2019) 30 tablet 2 Not Taking at Unknown time   Scheduled:  . amLODipine  2.5 mg Oral Daily  . atorvastatin  20 mg Oral Daily  . fentaNYL      . Fluticasone-Umeclidin-Vilant  1 puff Inhalation Daily  . heparin      . lidocaine-EPINEPHrine      . lisinopril  10 mg Oral Daily  . midazolam      . sodium chloride flush  3 mL Intravenous Q12H  . Vitamin B-12  1 tablet Sublingual Daily   Infusions:  . sodium chloride    . sodium chloride    . alteplase (LIMB ISCHEMIA) 10 mg in normal saline (0.02 mg/mL) infusion    . alteplase (LIMB ISCHEMIA) 10 mg in normal saline (0.02 mg/mL) infusion    . ceFAZolin    .  ceFAZolin (ANCEF) IV    . heparin     PRN: sodium chloride, diphenhydrAMINE, famotidine, HYDROmorphone (DILAUDID) injection, HYDROmorphone (DILAUDID) injection, methylPREDNISolone (SOLU-MEDROL) injection, midazolam, midazolam, morphine injection, ondansetron (ZOFRAN) IV, ondansetron (ZOFRAN) IV, oxyCODONE, oxyCODONE, sodium chloride flush  Assessment: Pharmacy consulted to monitor  heparin.   Goal of Therapy:  Heparin level 0.2 - 0.5 units units/ml Monitor platelets by anticoagulation protocol: Yes   Plan:  Start heparin infusion at 600 units/hr Check anti-Xa level in 6 hours and daily while on heparin Continue to monitor H&H and platelets  Oswald Hillock, PharmD, BCPS 01/28/2019,3:19 PM

## 2019-01-28 NOTE — Progress Notes (Signed)
SUBJECTIVE:  Patient ID: Ryan [name], male    DOB: 1955/11/22, 63 y.o.   MRN: [id number] Chief Complaint  Patient presents with  . Follow-up    post angio     HPI  Ryan [name] is a 63 y.o. male that presents today following an angiogram done on 01/21/2019.  The patient had a thrombectomy of the right CFA, SFA and anterior tibial artery.  The patient states that he started having pain initially about a day or 2 after the procedure.  He states that the pain has steadily progressed since that point to where now he feels as if he can barely walk.  He states that the pain is constant and he is unable to sleep because when he lays in bed the pain is excruciating.  He denies any loss of motor function at this time.  Denies any ulcerations at this time.  Patient denies any fever, chills, nausea, vomiting or diarrhea.  He denies any chest pain or shortness of breath.  Today the patient underwent noninvasive studies which indicate an ABI of 0.06 on his right was an absent TBI.  His left has an ABI of 1.24 with a TBI 0.85.  Previously on 01/18/2019 the ABI was 0.31 and the left lower extremity was 1.13.  Limited duplex shows the mid ATA has low monophasic flow and that the PTA is occluded.  The left lower extremity has triphasic waveforms in the tibial arteries.  Past Medical History:  Diagnosis Date  . Hyperlipidemia   . Hypertension   . Neuromuscular disorder (HCC)    numbness feet  . Peripheral vascular disease ([place])   . Shortness of breath dyspnea     Past Surgical History:  Procedure Laterality Date  . COLONOSCOPY WITH PROPOFOL N/A 02/15/2016   Procedure: COLONOSCOPY WITH PROPOFOL;  Surgeon: Lucilla Lame, MD;  Location: [facility];  Service: Endoscopy;  Laterality: N/A;  . HERNIA REPAIR  1999   left inguinal  . LOWER EXTREMITY ANGIOGRAPHY Right 05/07/2018   Procedure: LOWER EXTREMITY ANGIOGRAPHY;  Surgeon: Algernon Huxley, MD;  Location: [facility];  Service:  Cardiovascular;  Laterality: Right;  . LOWER EXTREMITY ANGIOGRAPHY Right 06/25/2018   Procedure: LOWER EXTREMITY ANGIOGRAPHY;  Surgeon: Algernon Huxley, MD;  Location: [facility];  Service: Cardiovascular;  Laterality: Right;  . LOWER EXTREMITY ANGIOGRAPHY Right 07/01/2018   Procedure: LOWER EXTREMITY ANGIOGRAPHY;  Surgeon: Algernon Huxley, MD;  Location: [facility];  Service: Cardiovascular;  Laterality: Right;  . LOWER EXTREMITY ANGIOGRAPHY Right 08/12/2018   Procedure: LOWER EXTREMITY ANGIOGRAPHY;  Surgeon: Algernon Huxley, MD;  Location: [facility];  Service: Cardiovascular;  Laterality: Right;  . LOWER EXTREMITY ANGIOGRAPHY Right 09/03/2018   Procedure: LOWER EXTREMITY ANGIOGRAPHY;  Surgeon: Algernon Huxley, MD;  Location: [facility];  Service: Cardiovascular;  Laterality: Right;  . LOWER EXTREMITY ANGIOGRAPHY Right 09/04/2018   Procedure: Lower Extremity Angiography;  Surgeon: Algernon Huxley, MD;  Location: [facility];  Service: Cardiovascular;  Laterality: Right;  . LOWER EXTREMITY ANGIOGRAPHY Right 10/01/2018   Procedure: LOWER EXTREMITY ANGIOGRAPHY;  Surgeon: Algernon Huxley, MD;  Location: [facility];  Service: Cardiovascular;  Laterality: Right;  . LOWER EXTREMITY ANGIOGRAPHY Right 10/01/2018   Procedure: Lower Extremity Angiography;  Surgeon: Algernon Huxley, MD;  Location: [facility];  Service: Cardiovascular;  Laterality: Right;  . LOWER EXTREMITY ANGIOGRAPHY Right 10/15/2018   Procedure: LOWER EXTREMITY  ANGIOGRAPHY;  Surgeon: Algernon Huxley, MD;  Location: [facility];  Service: Cardiovascular;  Laterality: Right;  . LOWER EXTREMITY ANGIOGRAPHY Left 10/16/2018   Procedure: Lower Extremity Angiography;  Surgeon: Algernon Huxley, MD;  Location: [facility];  Service: Cardiovascular;  Laterality: Left;  . LOWER EXTREMITY ANGIOGRAPHY Left 01/20/2019   Procedure: LOWER EXTREMITY ANGIOGRAPHY;  Surgeon: Algernon Huxley, MD;  Location: [facility];  Service: Cardiovascular;  Laterality: Left;  . LOWER EXTREMITY ANGIOGRAPHY Right 01/21/2019   Procedure: Lower Extremity Angiography;  Surgeon: Algernon Huxley, MD;  Location: [facility];  Service: Cardiovascular;  Laterality: Right;  . LOWER EXTREMITY INTERVENTION Right 07/02/2018   Procedure: LOWER EXTREMITY INTERVENTION;  Surgeon: Algernon Huxley, MD;  Location: [facility];  Service: Cardiovascular;  Laterality: Right;  . POLYPECTOMY N/A 02/15/2016   Procedure: POLYPECTOMY;  Surgeon: Lucilla Lame, MD;  Location: [facility];  Service: Endoscopy;  Laterality: N/A;    Social History   Socioeconomic History  . Marital status: Legally Separated    Spouse name: [name]  . Number of children: 5  . Years of education: Not on file  . Highest education level: High school graduate  Occupational History  . Occupation: Forklift    Comment: [name]  Social Needs  . Financial resource strain: Not hard at all  . Food insecurity    Worry: Never true    Inability: Never true  . Transportation needs    Medical: No    Non-medical: No  Tobacco Use  . Smoking status: Former Smoker    Packs/day: 0.10    Years: 40.00    Pack years: 4.00    Types: Cigarettes    Start date: 07/21/1978    Quit date: 08/28/2018    Years since quitting: 0.4  . Smokeless tobacco: Never Used  Substance and Sexual Activity  . Alcohol use: Yes    Alcohol/week: 12.0 standard drinks    Types: 12 Cans of beer per week  . Drug use: Never  . Sexual activity: Yes    Partners: Female    Birth control/protection: None  Lifestyle  . Physical activity    Days per week: 6 days    Minutes per session: 60 min  . Stress: Not at all  Relationships  . Social Herbalist on phone: Three times a week    Gets together: Twice a week    Attends religious service: Never    Active member of club or organization: No    Attends meetings of clubs or organizations: Never    Relationship  status: Married  . Intimate partner violence    Fear of current or ex partner: No    Emotionally abused: No    Physically abused: No    Forced sexual activity: No  Other Topics Concern  . Not on file  Social History Narrative  . Not on file    Family History  Problem Relation Age of Onset  . COPD Mother   . Hypertension Father   . Heart attack Brother     No Known Allergies   Review of Systems   Review of Systems: Negative Unless Checked Constitutional: [] Weight loss  [] Fever  [] Chills Cardiac: [] Chest pain   []  Atrial Fibrillation  [] Palpitations   [] Shortness of breath when laying flat   [] Shortness of breath with exertion. [] Shortness of breath at rest Vascular:  [x] Pain in legs with walking   [  x]Pain in legs with standing [x] Pain in legs when laying flat   [x] Claudication    [] Pain in feet when laying flat    [] History of DVT   [] Phlebitis   [x] Swelling in legs   [x] Varicose veins   [] Non-healing ulcers Pulmonary:   [] Uses home oxygen   [] Productive cough   [] Hemoptysis   [] Wheeze  [x] COPD   [] Asthma Neurologic:  [] Dizziness   [] Seizures  [] Blackouts [] History of stroke   [] History of TIA  [] Aphasia   [] Temporary Blindness   [] Weakness or numbness in arm   [] Weakness or numbness in leg Musculoskeletal:   [] Joint swelling   [] Joint pain   [] Low back pain  []  History of Knee Replacement [] Arthritis [] back Surgeries  []  Spinal Stenosis    Hematologic:  [] Easy bruising  [] Easy bleeding   [] Hypercoagulable state   [] Anemic Gastrointestinal:  [] Diarrhea   [] Vomiting  [] Gastroesophageal reflux/heartburn   [] Difficulty swallowing. [] Abdominal pain Genitourinary:  [] Chronic kidney disease   [] Difficult urination  [] Anuric   [] Blood in urine [] Frequent urination  [] Burning with urination   [] Hematuria Skin:  [] Rashes   [] Ulcers [] Wounds Psychological:  [] History of anxiety   []  History of major depression  []  Memory Difficulties      OBJECTIVE:   Physical Exam  BP 130/84 (BP  Location: Right Arm)   Pulse 94   Resp 16   Gen: WD/WN, NAD Head: [place]/AT, No temporalis wasting.  Ear/Nose/Throat: Hearing grossly intact, nares w/o erythema or drainage Eyes: PER, EOMI, sclera nonicteric.  Neck: Supple, no masses.  No JVD.  Pulmonary:  Good air movement, no use of accessory muscles.  Cardiac: RRR Vascular:  Vessel Right Left  Radial Palpable Palpable  Dorsalis Pedis Not Palpable Palpable  Posterior Tibial Not Palpable Palpable   Gastrointestinal: soft, non-distended. No guarding/no peritoneal signs.  Musculoskeletal: Patient using cane for ambulation No deformity or atrophy.  Neurologic: Pain and light touch intact in extremities.  Symmetrical.  Speech is fluent. Motor exam as listed above. Psychiatric: Judgment intact, Mood & affect appropriate for pt's clinical situation. Dermatologic: No Venous rashes. No Ulcers Noted.  No changes consistent with cellulitis. Lymph : No Cervical lymphadenopathy, no lichenification or skin changes of chronic lymphedema.       ASSESSMENT AND PLAN:  1. Ischemic leg Today the patient underwent noninvasive studies which indicate an ABI of 0.06 on his right was an absent TBI.  His left has an ABI of 1.24 with a TBI 0.85.  Previously on 01/18/2019 the ABI was 0.31 and the left lower extremity was 1.13.  Limited duplex shows the mid ATA has low monophasic flow and that the PTA is occluded.  The left lower extremity has triphasic waveforms in the tibial arteries.   I had a very long discussion with the patient as well as his daughter.  The patient as well as his daughter wanted a referral to the [facility] office for evaluation in regards to his ischemic leg.  I discussed with the patient that I would definitely be more than willing to facilitate a referral to the [facility]  office however at this time, intervening on the patient's leg was of the utmost importance.  I discussed with the patient that at this point his perfusion was so low  that delaying treatment by a matter of days may mean the difference between permanent motor damage or even loss of his leg.  I advised the patient that at this time the best course of treatment was either to present  to [facility] for immediate treatment or he was also welcome to be evaluated at the [facility] main hospital emergency room.  I also offered to contact the emergency room charge nurse to help facilitate his treatment and evaluation there as well.  The patient at this time decided that he wanted immediate treatment [facility] and that we would facilitate transfer services at a later date and time.  Recommend:  The patient has evidence of severe atherosclerotic changes of both lower extremities with rest pain that is associated with preulcerative changes and impending tissue loss of the foot.  This represents a limb threatening ischemia and places the patient at the risk for limb loss.  Patient should undergo angiography of the lower extremities with the hope for intervention for limb salvage.  The risks and benefits as well as the alternative therapies was discussed in detail with the patient.  All questions were answered.  Patient agrees to proceed with angiography.  The patient will follow up with me in the office after the procedure.       2. Hypertension, benign Continue antihypertensive medications as already ordered, these medications have been reviewed and there are no changes at this time.   3. Tobacco use Patient does continue to abstain from tobacco use patient continue to continue to abstain  4. Other emphysema ([place]) Continue pulmonary medications and aerosols as already ordered, these medications have been reviewed and there are no changes at this time.     No current facility-administered medications on file prior to visit.    Current Outpatient Medications on File Prior to Visit  Medication Sig Dispense Refill  . amLODipine  (NORVASC) 2.5 MG tablet Take 1 tablet (2.5 mg total) by mouth daily. 30 tablet 0  . apixaban (ELIQUIS) 5 MG TABS tablet Take 1 tablet (5 mg total) by mouth 2 (two) times daily. 180 tablet 3  . aspirin EC 81 MG tablet Take 1 tablet (81 mg total) by mouth daily. 90 tablet 3  . atorvastatin (LIPITOR) 20 MG tablet Take 1 tablet (20 mg total) by mouth daily. 90 tablet 3  . Cyanocobalamin (VITAMIN B-12) 1000 MCG SUBL Place 1 tablet (1,000 mcg total) under the tongue daily. 90 tablet 0  . Fluticasone-Umeclidin-Vilant (TRELEGY ELLIPTA) 100-62.5-25 MCG/INH AEPB Inhale 1 puff into the lungs daily. 60 each 2  . ibuprofen (ADVIL) 200 MG tablet Take 200 mg by mouth every 4 (four) hours as needed.    Marland Kitchen lisinopril (ZESTRIL) 10 MG tablet Take 1 tablet (10 mg total) by mouth daily. 180 tablet 0  . thiamine (VITAMIN B-1) 50 MG tablet Take 1 tablet (50 mg total) by mouth daily. 30 tablet 2    There are no Patient Instructions on file for this visit. No follow-ups on file.   Kris Hartmann, NP  This note was completed with Sales executive.  Any errors are purely unintentional.

## 2019-01-28 NOTE — Op Note (Signed)
[facility]  Percutaneous Study/Intervention Procedural Note   Date of Surgery: 01/28/2019  Surgeon(s):DEW,JASON    Assistants:none  Pre-operative Diagnosis: PAD with rest pain right lower extremity with occlusion of previous interventions  Post-operative diagnosis:  Same  Procedure(s) Performed:             1.  Ultrasound guidance for vascular access left femoral artery             2.  Catheter placement into right popliteal artery from left femoral approach             3.  Selective right lower extremity angiogram             4.   Catheter directed thrombolytic therapy with 8 mg of TPA to the right SFA, popliteal artery, and tibioperoneal trunk with placement of infusion catheter for continued thrombolytic therapy overnight              EBL: 2 cc  Contrast: 30 cc  Fluoro Time: 4.2 minutes  Moderate Conscious Sedation Time: approximately 20 minutes using 3 mg of Versed and 75 Mcg of Fentanyl              Indications:  Patient is a 63 y.o.male with rest pain of the right leg after multiple previous interventions. The patient has noninvasive study showing markedly reduced perfusion in the right leg. The patient is brought in for angiography for further evaluation and potential treatment.  Due to the limb threatening nature of the situation, angiogram was performed for attempted limb salvage. The patient is aware that if the procedure fails, amputation would be expected.  The patient also understands that even with successful revascularization, amputation may still be required due to the severity of the situation.  Risks and benefits are discussed and informed consent is obtained.   Procedure:  The patient was identified and appropriate procedural time out was performed.  The patient was then placed supine on the table and prepped and draped in the usual sterile fashion. Moderate conscious sedation was administered during a face to face encounter with the patient  throughout the procedure with my supervision of the RN administering medicines and monitoring the patient's vital signs, pulse oximetry, telemetry and mental status throughout from the start of the procedure until the patient was taken to the recovery room. Ultrasound was used to evaluate the left common femoral artery.  It was patent .  A digital ultrasound image was acquired.  A Seldinger needle was used to access the left common femoral artery under direct ultrasound guidance and a permanent image was performed.  A 0.035 J wire was advanced without resistance and a 23 cm 6 Fr sheath was placed.   And aortogram was not performed due to multiple previous aortograms and no expectation of worsening inflow. I then crossed the aortic bifurcation and advanced to the right femoral head. Selective right lower extremity angiogram was then performed. This demonstrated thrombosis of the SFA and popliteal stents with near flush occlusion proximally.  The common femoral artery and profunda femoris artery remained normal.  Distally, after the catheter got into the popliteal artery there remained occlusive thrombus in both the posterior tibial and anterior tibial arteries.  There appeared to be faint reconstitution of the distal anterior tibial artery although this was very difficult to see. It was felt that it was in the patient's best interest to proceed with intervention after these images to avoid a second procedure and a larger  amount of contrast and fluoroscopy based off of the findings from the initial angiogram, but it was clear that continuous thrombolytic therapy would be required to help uncover further issues.  A wire and catheter were advanced down the popliteal artery to help see the tibial vessels better but the opacification remained very poor with thrombosis of the tibial vessels as above.  I then used a 50 cm working length 90 cm total length thrombolytic catheter and instilled 8 mg of TPA throughout the entire  SFA and popliteal arteries down into the proximal tibioperoneal trunk.  This catheter was then secured into place and continuous thrombolytic therapy will be continued overnight. The patient was taken to the recovery room in stable condition having tolerated the procedure well.  Findings:                           Right Lower Extremity:  Thrombosis of the SFA and popliteal stents with near flush occlusion proximally.  The common femoral artery and profunda femoris artery remained normal.  Distally, after the catheter got into the popliteal artery there remained occlusive thrombus in both the posterior tibial and anterior tibial arteries.  There appeared to be faint reconstitution of the distal anterior tibial artery although this was very difficult to see.   Disposition: Patient was taken to the recovery room in stable condition having tolerated the procedure well.  Complications: None  Leotis Pain 01/28/2019 3:58 PM   This note was created with Dragon Medical transcription system. Any errors in dictation are purely unintentional.

## 2019-01-28 NOTE — H&P (Signed)
[facility] History & Physical Update  The patient was interviewed and re-examined.  The patient's previous History and Physical has been reviewed and is unchanged.  There is no change in the plan of care. We plan to proceed with the scheduled procedure.  We discussed at length that at this point, amputation is very likely.  If he still has a distal bypass option, that may be an option for revascularization but I would be very concerned about its durability as well.  Percutaneous revascularization is obviously an option but this has failed to be durable in the past.  I suspect thrombolytic therapy will be required overnight to evaluate the distal perfusion.  Leotis Pain, MD  01/28/2019, 2:55 PM

## 2019-01-29 ENCOUNTER — Encounter: Payer: Self-pay | Admitting: Vascular Surgery

## 2019-01-29 ENCOUNTER — Encounter
Admission: RE | Disposition: A | Discharge status: Home / Self Care | Payer: Self-pay | Source: Ambulatory Visit | Attending: Vascular Surgery

## 2019-01-29 DIAGNOSIS — I70221 Atherosclerosis of native arteries of extremities with rest pain, right leg: Secondary | ICD-10-CM

## 2019-01-29 HISTORY — PX: LOWER EXTREMITY ANGIOGRAPHY: CATH118251

## 2019-01-29 LAB — BASIC METABOLIC PANEL
Anion gap: 10 (ref 5–15)
BUN: 25 mg/dL — ABNORMAL HIGH (ref 8–23)
CO2: 21 mmol/L — ABNORMAL LOW (ref 22–32)
Calcium: 8.7 mg/dL — ABNORMAL LOW (ref 8.9–10.3)
Chloride: 104 mmol/L (ref 98–111)
Creatinine, Ser: 1.76 mg/dL — ABNORMAL HIGH (ref 0.61–1.24)
GFR calc Af Amer: 47 mL/min — ABNORMAL LOW (ref >60)
GFR calc non Af Amer: 41 mL/min — ABNORMAL LOW (ref >60)
Glucose, Bld: 129 mg/dL — ABNORMAL HIGH (ref 70–99)
Potassium: 5.1 mmol/L (ref 3.5–5.1)
Sodium: 135 mmol/L (ref 135–145)

## 2019-01-29 LAB — CBC
HCT: 26.5 % — ABNORMAL LOW (ref 39.0–52.0)
HCT: 26 % — ABNORMAL LOW (ref 39.0–52.0)
Hemoglobin: 8.9 g/dL — ABNORMAL LOW (ref 13.0–17.0)
Hemoglobin: 8.3 g/dL — ABNORMAL LOW (ref 13.0–17.0)
MCH: 30.2 pg (ref 26.0–34.0)
MCH: 29.9 pg (ref 26.0–34.0)
MCHC: 33.6 g/dL (ref 30.0–36.0)
MCHC: 31.9 g/dL (ref 30.0–36.0)
MCV: 89.8 fL (ref 80.0–100.0)
MCV: 93.5 fL (ref 80.0–100.0)
Platelets: 359 10*3/uL (ref 150–400)
Platelets: 335 10*3/uL (ref 150–400)
RBC: 2.95 MIL/uL — ABNORMAL LOW (ref 4.22–5.81)
RBC: 2.78 MIL/uL — ABNORMAL LOW (ref 4.22–5.81)
RDW: 14.6 % (ref 11.5–15.5)
RDW: 14.6 % (ref 11.5–15.5)
WBC: 9.6 10*3/uL (ref 4.0–10.5)
WBC: 8.2 10*3/uL (ref 4.0–10.5)
nRBC: 0 % (ref 0.0–0.2)
nRBC: 0 % (ref 0.0–0.2)

## 2019-01-29 LAB — FIBRINOGEN
Fibrinogen: 161 mg/dL — ABNORMAL LOW (ref 210–475)
Fibrinogen: 146 mg/dL — ABNORMAL LOW (ref 210–475)

## 2019-01-29 LAB — APTT
aPTT: 54 seconds — ABNORMAL HIGH (ref 24–36)
aPTT: 75 seconds — ABNORMAL HIGH (ref 24–36)
aPTT: 125 seconds — ABNORMAL HIGH (ref 24–36)

## 2019-01-29 LAB — MAGNESIUM: Magnesium: 1.9 mg/dL (ref 1.7–2.4)

## 2019-01-29 LAB — HEPARIN LEVEL (UNFRACTIONATED)
Heparin Unfractionated: 1.54 IU/mL — ABNORMAL HIGH (ref 0.30–0.70)
Heparin Unfractionated: 1.35 IU/mL — ABNORMAL HIGH (ref 0.30–0.70)

## 2019-01-29 SURGERY — LOWER EXTREMITY ANGIOGRAPHY
Anesthesia: Moderate Sedation | Laterality: Right

## 2019-01-29 MED ORDER — FENTANYL CITRATE (PF) 100 MCG/2ML IJ SOLN
INTRAMUSCULAR | Status: AC
  Filled 2019-01-29: qty 2

## 2019-01-29 MED ORDER — MIDAZOLAM HCL 2 MG/2ML IJ SOLN
INTRAMUSCULAR | Status: DC | PRN
  Administered 2019-01-29: 2 mg via INTRAVENOUS
  Administered 2019-01-29: 1 mg via INTRAVENOUS

## 2019-01-29 MED ORDER — CEFAZOLIN SODIUM-DEXTROSE 1-4 GM/50ML-% IV SOLN
1.0000 g | Freq: Once | INTRAVENOUS | Status: AC
  Administered 2019-01-29: 13:00:00 1 g via INTRAVENOUS

## 2019-01-29 MED ORDER — HEPARIN SODIUM (PORCINE) 1000 UNIT/ML IJ SOLN
INTRAMUSCULAR | Status: DC | PRN
  Administered 2019-01-29: 3000 [IU] via INTRAVENOUS

## 2019-01-29 MED ORDER — DIPHENHYDRAMINE HCL 50 MG/ML IJ SOLN: 50.0000 mg | Freq: Once | INTRAMUSCULAR | Status: DC | PRN

## 2019-01-29 MED ORDER — LIDOCAINE-EPINEPHRINE (PF) 1 %-1:200000 IJ SOLN
INTRAMUSCULAR | Status: AC
  Filled 2019-01-29: qty 30

## 2019-01-29 MED ORDER — MIDAZOLAM HCL 5 MG/5ML IJ SOLN
INTRAMUSCULAR | Status: AC
  Filled 2019-01-29: qty 5

## 2019-01-29 MED ORDER — TIROFIBAN HCL IN NACL 5-0.9 MG/100ML-% IV SOLN
0.0750 ug/kg/min | INTRAVENOUS | Status: DC
  Administered 2019-01-29 – 2019-01-30 (×2): 0.075 ug/kg/min via INTRAVENOUS
  Filled 2019-01-29 (×4): qty 100

## 2019-01-29 MED ORDER — HEPARIN SODIUM (PORCINE) 1000 UNIT/ML IJ SOLN
INTRAMUSCULAR | Status: AC
  Filled 2019-01-29: qty 1

## 2019-01-29 MED ORDER — HYDROMORPHONE HCL 1 MG/ML IJ SOLN
INTRAMUSCULAR | Status: AC
  Filled 2019-01-29: qty 1

## 2019-01-29 MED ORDER — ONDANSETRON HCL 4 MG/2ML IJ SOLN: 4.0000 mg | Freq: Four times a day (QID) | INTRAMUSCULAR | Status: DC | PRN

## 2019-01-29 MED ORDER — FENTANYL CITRATE (PF) 100 MCG/2ML IJ SOLN
INTRAMUSCULAR | Status: DC | PRN
  Administered 2019-01-29: 50 ug via INTRAVENOUS
  Administered 2019-01-29: 25 ug via INTRAVENOUS

## 2019-01-29 MED ORDER — MIDAZOLAM HCL 2 MG/ML PO SYRP: 8.0000 mg | ORAL_SOLUTION | Freq: Once | ORAL | Status: DC | PRN

## 2019-01-29 MED ORDER — TIROFIBAN (AGGRASTAT) BOLUS VIA INFUSION
25.0000 ug/kg | Freq: Once | INTRAVENOUS | Status: AC
  Administered 2019-01-29: 15:00:00 1490 ug via INTRAVENOUS
  Filled 2019-01-29: qty 30

## 2019-01-29 MED ORDER — APIXABAN 5 MG PO TABS
5.0000 mg | ORAL_TABLET | Freq: Two times a day (BID) | ORAL | Status: DC
  Administered 2019-01-30: 5 mg via ORAL
  Filled 2019-01-29 (×2): qty 1

## 2019-01-29 MED ORDER — METHYLPREDNISOLONE SODIUM SUCC 125 MG IJ SOLR: 125.0000 mg | Freq: Once | INTRAMUSCULAR | Status: DC | PRN

## 2019-01-29 MED ORDER — HYDROMORPHONE HCL 1 MG/ML IJ SOLN: 1.0000 mg | Freq: Once | INTRAMUSCULAR | Status: DC | PRN

## 2019-01-29 MED ORDER — FAMOTIDINE 20 MG PO TABS: 40.0000 mg | ORAL_TABLET | Freq: Once | ORAL | Status: DC | PRN

## 2019-01-29 MED ORDER — CEFAZOLIN SODIUM-DEXTROSE 1-4 GM/50ML-% IV SOLN
INTRAVENOUS | Status: AC
  Administered 2019-01-29: 13:00:00 1 g via INTRAVENOUS
  Filled 2019-01-29: qty 50

## 2019-01-29 SURGICAL SUPPLY — 10 items
BALLN LUTONIX 018 4X80X130 (BALLOONS) ×2
BALLOON LUTONIX 018 4X80X130 (BALLOONS) IMPLANT
CANISTER PENUMBRA ENGINE (MISCELLANEOUS) ×1 IMPLANT
CATH INDIGO CAT6 KIT (CATHETERS) ×1 IMPLANT
DEVICE PRESTO INFLATION (MISCELLANEOUS) ×1 IMPLANT
DEVICE SAFEGUARD 24CM (GAUZE/BANDAGES/DRESSINGS) ×1 IMPLANT
DEVICE STARCLOSE SE CLOSURE (Vascular Products) ×1 IMPLANT
PACK ANGIOGRAPHY (CUSTOM PROCEDURE TRAY) ×2 IMPLANT
WIRE G V18X300CM (WIRE) ×1 IMPLANT
WIRE J 3MM .035X145CM (WIRE) ×1 IMPLANT

## 2019-01-29 NOTE — Op Note (Signed)
[facility]  Percutaneous Study/Intervention Procedural Note   Date of Surgery: 01/29/2019  Surgeon(s):Aqueelah Cotrell    Assistants:none  Pre-operative Diagnosis: PAD with rest [name]  Post-operative diagnosis:  Same  Procedure(s) Performed:             1.   Right lower extremity angiogram             2.  Catheter placement into right tibioperoneal trunk from left femoral approach             3.   Mechanical thrombectomy to the right popliteal artery with the penumbra cat 6 device             4.  Percutaneous transluminal angioplasty of right tibioperoneal trunk and distal popliteal artery with 4 mm diameter by 8 cm length Lutonix drug-coated angioplasty balloon             5.  StarClose closure device left femoral artery  EBL: 100 cc  Contrast: 55 cc  Fluoro Time: 4.8 minutes  Moderate Conscious Sedation Time: approximately 30 minutes using 3 mg of Versed and 75 mcg of Fentanyl              Indications:  Patient is a 63 y.o.male with recurrent limb threatening ischemia of the right lower extremity.  He has been on TPA overnight and is brought back for second look angiography.  Due to the limb threatening nature of the situation, angiogram was performed for attempted limb salvage. The patient is aware that if the procedure fails, amputation would be expected.  The patient also understands that even with successful revascularization, amputation may still be required due to the severity of the situation. Risks and benefits are discussed and informed consent is obtained.   Procedure:  The patient was identified and appropriate procedural time out was performed.  The patient was then placed supine on the table and prepped and draped in the usual sterile fashion. Moderate conscious sedation was administered during a face to face encounter with the patient throughout the procedure with my supervision of the RN administering medicines and monitoring the patient's  vital signs, pulse oximetry, telemetry and mental status throughout from the start of the procedure until the patient was taken to the recovery room.  The existing thrombolytic catheter was removed and a 0.018 wire was parked into the distal posterior tibial artery.  The patient artery had a 6 French sheath up and over the aortic bifurcation.  Selective right lower extremity angiogram was then performed. This demonstrated right common femoral artery, profunda femoris artery, and SFA were now widely patent.  There was a blob of thrombus in the above-knee popliteal artery at the area of slight narrowing with a stent and calcification that is creating a greater than 50% stenosis.  The distal popliteal artery and tibioperoneal trunk had about a 70% stenosis but the anterior tibial artery and posterior tibial arteries were then continuous to the foot.  Peroneal artery was occluded.  This was a marked improvement after 24 hours of thrombolytic therapy and these 2 issues were then treated to try to maintain patency.  The tibioperoneal trunk and distal popliteal artery down into the proximal posterior tibial artery were then addressed with a 4 mm diameter by 8 cm length Lutonix drug-coated angioplasty balloon.  This was inflated to 12 atm for 1 minute.  Completion imaging showed only about a 10% residual stenosis in this location with maintain patency of the anterior tibial  and posterior tibial arteries.  I then brought the penumbra cat 6 device onto the field and perform mechanical thrombectomy to the blob of thrombus in the above-knee popliteal artery.  1 pass with a chunk of thrombus removed and restoration of flow was seen.  Selective imaging with the catheter in the proximal popliteal artery was performed which showed no significant residual thrombus or stenosis in this location after thrombectomy. I elected to terminate the procedure. The sheath was removed and StarClose closure device was deployed in the left femoral  artery with excellent hemostatic result. The patient was taken to the recovery room in stable condition having tolerated the procedure well.  Findings:                           Right lower Extremity:  Right common femoral artery, profunda femoris artery, and SFA were now widely patent.  There was a blob of thrombus in the above-knee popliteal artery at the area of slight narrowing with a stent and calcification that is creating a greater than 50% stenosis.  The distal popliteal artery and tibioperoneal trunk had about a 70% stenosis but the anterior tibial artery and posterior tibial arteries were then continuous to the foot.  Peroneal artery was occluded.   Disposition: Patient was taken to the recovery room in stable condition having tolerated the procedure well.  Complications: None  Ryan [name] 01/29/2019 1:48 PM   This note was created with Dragon Medical transcription system. Any errors in dictation are purely unintentional.

## 2019-01-29 NOTE — Consult Note (Signed)
ANTICOAGULATION CONSULT NOTE - follow up  Pharmacy Consult for Heparin Indication: VTE Treatment. Maintain heparin level 0.2 - 0.5 units.mL  No Known Allergies  Patient Measurements: Height: 5\' 8"  (172.7 cm) Weight: 131 lb 6.3 oz (59.6 kg) IBW/kg (Calculated) : 68.4 Heparin Dosing Weight: 61.2 kg  Vital Signs: Temp: 98.3 F (36.8 C) (08/07 0800) Temp Source: Oral (08/07 0800) BP: 94/76 (08/07 0800) Pulse Rate: 87 (08/07 0800)  Labs: Recent Labs    01/28/19 1503 01/28/19 1753 01/28/19 2207 01/29/19 0310 01/29/19 0740  HGB 9.4* 9.2* 8.6* 8.9* 8.3*  HCT 28.9* 28.9* 25.9* 26.5* 26.0*  PLT 488* 465* 374 359 335  APTT  --   --  54*  --  75*  HEPARINUNFRC  --  2.84* 2.18* 1.54*  --   CREATININE 1.56*  --   --  1.76*  --     Estimated Creatinine Clearance: 37.2 mL/min (A) (by C-G formula based on SCr of 1.76 mg/dL (H)).   Medical History: Past Medical History:  Diagnosis Date  . Hyperlipidemia   . Hypertension   . Neuromuscular disorder (HCC)    numbness feet  . Peripheral vascular disease ([place])   . Shortness of breath dyspnea     Medications:  Facility-Administered Medications Prior to Admission  Medication Dose Route Frequency Provider Last Rate Last Dose  . oxyCODONE (Oxy IR/ROXICODONE) immediate release tablet 5 mg  5 mg Oral Q4H PRN Stegmayer, Kimberly A, PA-C       Medications Prior to Admission  Medication Sig Dispense Refill Last Dose  . amLODipine (NORVASC) 2.5 MG tablet Take 1 tablet (2.5 mg total) by mouth daily. 30 tablet 0 01/28/2019 at Unknown time  . apixaban (ELIQUIS) 5 MG TABS tablet Take 1 tablet (5 mg total) by mouth 2 (two) times daily. 180 tablet 3 01/28/2019 at Unknown time  . aspirin EC 81 MG tablet Take 1 tablet (81 mg total) by mouth daily. 90 tablet 3 01/28/2019 at Unknown time  . atorvastatin (LIPITOR) 20 MG tablet Take 1 tablet (20 mg total) by mouth daily. 90 tablet 3 01/27/2019 at Unknown time  . Cyanocobalamin (VITAMIN B-12) 1000 MCG SUBL  Place 1 tablet (1,000 mcg total) under the tongue daily. 90 tablet 0 01/27/2019 at Unknown time  . Fluticasone-Umeclidin-Vilant (TRELEGY ELLIPTA) 100-62.5-25 MCG/INH AEPB Inhale 1 puff into the lungs daily. 60 each 2 01/27/2019 at Unknown time  . ibuprofen (ADVIL) 200 MG tablet Take 200 mg by mouth every 4 (four) hours as needed.   Past Month at Unknown time  . lisinopril (ZESTRIL) 10 MG tablet Take 1 tablet (10 mg total) by mouth daily. 180 tablet 0 01/27/2019 at Unknown time  . thiamine (VITAMIN B-1) 50 MG tablet Take 1 tablet (50 mg total) by mouth daily. (Patient not taking: Reported on 01/28/2019) 30 tablet 2 Not Taking at Unknown time   Scheduled:  . amLODipine  2.5 mg Oral Daily  . atorvastatin  20 mg Oral Daily  . Chlorhexidine Gluconate Cloth  6 each Topical Q0600  . fluticasone furoate-vilanterol  1 puff Inhalation Daily   And  . umeclidinium bromide  1 puff Inhalation Daily  . lisinopril  10 mg Oral Daily  . sodium chloride flush  3 mL Intravenous Q12H  . vitamin B-12  1,000 mcg Oral Daily   Infusions:  . sodium chloride    . alteplase (LIMB ISCHEMIA) 10 mg in normal saline (0.02 mg/mL) infusion 0.4 mg/hr (01/29/19 0800)  . heparin 700 Units/hr (01/29/19 0202)  PRN: sodium chloride, hydrALAZINE, HYDROmorphone (DILAUDID) injection, midazolam, morphine injection, ondansetron (ZOFRAN) IV, ondansetron (ZOFRAN) IV, oxyCODONE, oxyCODONE, sodium chloride flush  Assessment: Pharmacy consulted to monitor heparin. Pt was on Eliquis PTA.  Last dose was 8/6.  Expect heparin levels to be elevated due to Eliquis, will need to dose based on aPTT.  Goal of Therapy:  APTT goal ~56-80 seconds Heparin level 0.2 - 0.5 units units/ml Monitor platelets by anticoagulation protocol: Yes   Plan:  8/7 0740 APTT 75. Significant increase in APTT from 54 (slightly subtherapeutic) to 75 (upper end of therapeutic). Because patient is also receiving alteplase, will dose more cautiously and decrease heparin drip  to 650 units/hr. APTT ordered for 1500.  CBC and HL with am labs.  Pharmacy to follow and adjust heparin as indicated.  Tawnya Crook, PharmD Clinical Pharmacist 01/29/2019,8:18 AM

## 2019-01-29 NOTE — Plan of Care (Signed)

## 2019-01-29 NOTE — H&P (Signed)
[facility] History & Physical Update  The patient was interviewed and re-examined.  The patient's previous History and Physical has been reviewed and is unchanged.  There is no change in the plan of care. We plan to proceed with the scheduled procedure.  Leotis Pain, MD  01/29/2019, 12:19 PM

## 2019-01-29 NOTE — Consult Note (Signed)
ANTICOAGULATION CONSULT NOTE - follow up  Pharmacy Consult for Heparin Indication: VTE Treatment. Maintain heparin level 0.2 - 0.5 units.mL  No Known Allergies  Patient Measurements: Height: 5\' 8"  (172.7 cm) Weight: 131 lb 6.3 oz (59.6 kg) IBW/kg (Calculated) : 68.4 Heparin Dosing Weight: 61.2 kg  Vital Signs: Temp: 98.6 F (37 C) (08/07 0028) Temp Source: Oral (08/07 0028) BP: 174/101 (08/07 0000) Pulse Rate: 102 (08/07 0028)  Labs: Recent Labs    01/28/19 1503 01/28/19 1753 01/28/19 2207  HGB 9.4* 9.2* 8.6*  HCT 28.9* 28.9* 25.9*  PLT 488* 465* 374  APTT  --   --  54*  HEPARINUNFRC  --  2.84* 2.18*  CREATININE 1.56*  --   --     Estimated Creatinine Clearance: 41.9 mL/min (A) (by C-G formula based on SCr of 1.56 mg/dL (H)).   Medical History: Past Medical History:  Diagnosis Date  . Hyperlipidemia   . Hypertension   . Neuromuscular disorder (HCC)    numbness feet  . Peripheral vascular disease ([place])   . Shortness of breath dyspnea     Medications:  Facility-Administered Medications Prior to Admission  Medication Dose Route Frequency Provider Last Rate Last Dose  . oxyCODONE (Oxy IR/ROXICODONE) immediate release tablet 5 mg  5 mg Oral Q4H PRN Stegmayer, Kimberly A, PA-C       Medications Prior to Admission  Medication Sig Dispense Refill Last Dose  . amLODipine (NORVASC) 2.5 MG tablet Take 1 tablet (2.5 mg total) by mouth daily. 30 tablet 0 01/28/2019 at Unknown time  . apixaban (ELIQUIS) 5 MG TABS tablet Take 1 tablet (5 mg total) by mouth 2 (two) times daily. 180 tablet 3 01/28/2019 at Unknown time  . aspirin EC 81 MG tablet Take 1 tablet (81 mg total) by mouth daily. 90 tablet 3 01/28/2019 at Unknown time  . atorvastatin (LIPITOR) 20 MG tablet Take 1 tablet (20 mg total) by mouth daily. 90 tablet 3 01/27/2019 at Unknown time  . Cyanocobalamin (VITAMIN B-12) 1000 MCG SUBL Place 1 tablet (1,000 mcg total) under the tongue daily. 90 tablet 0 01/27/2019 at Unknown  time  . Fluticasone-Umeclidin-Vilant (TRELEGY ELLIPTA) 100-62.5-25 MCG/INH AEPB Inhale 1 puff into the lungs daily. 60 each 2 01/27/2019 at Unknown time  . ibuprofen (ADVIL) 200 MG tablet Take 200 mg by mouth every 4 (four) hours as needed.   Past Month at Unknown time  . lisinopril (ZESTRIL) 10 MG tablet Take 1 tablet (10 mg total) by mouth daily. 180 tablet 0 01/27/2019 at Unknown time  . thiamine (VITAMIN B-1) 50 MG tablet Take 1 tablet (50 mg total) by mouth daily. (Patient not taking: Reported on 01/28/2019) 30 tablet 2 Not Taking at Unknown time   Scheduled:  . amLODipine  2.5 mg Oral Daily  . atorvastatin  20 mg Oral Daily  . Chlorhexidine Gluconate Cloth  6 each Topical Q0600  . fentaNYL      . fluticasone furoate-vilanterol  1 puff Inhalation Daily   And  . umeclidinium bromide  1 puff Inhalation Daily  . heparin      . lidocaine-EPINEPHrine      . lisinopril  10 mg Oral Daily  . midazolam      . morphine      . sodium chloride flush  3 mL Intravenous Q12H  . sodium chloride flush      . vitamin B-12  1,000 mcg Oral Daily   Infusions:  . sodium chloride    . alteplase (  LIMB ISCHEMIA) 10 mg in normal saline (0.02 mg/mL) infusion 0.5 mg/hr (01/28/19 2200)  . ceFAZolin    . heparin 600 Units/hr (01/28/19 2200)   PRN: sodium chloride, hydrALAZINE, HYDROmorphone (DILAUDID) injection, midazolam, morphine injection, ondansetron (ZOFRAN) IV, ondansetron (ZOFRAN) IV, oxyCODONE, oxyCODONE, sodium chloride flush  Assessment: Pharmacy consulted to monitor heparin. Pt was on Eliquis PTA.  Last dose was 8/6.  Expect heparin levels to be elevated due to Eliquis, will need to dose based on aPTT.  Goal of Therapy:  APTT goal ~56-80 seconds Heparin level 0.2 - 0.5 units units/ml Monitor platelets by anticoagulation protocol: Yes   Plan:  8/6 @ 2207 HL = 2.18, aPTT 54  APTT is subtherapeutic, will increase Heparin to 700 units/hr Recheck aPTT in 6 hours Monitor CBC daily per  protocol  Ena Dawley, PharmD 01/29/2019,12:49 AM

## 2019-01-29 NOTE — Progress Notes (Signed)
Shift summary:  - Reassuring assessment of RLE this AM.

## 2019-01-30 DIAGNOSIS — I70221 Atherosclerosis of native arteries of extremities with rest pain, right leg: Secondary | ICD-10-CM

## 2019-01-30 LAB — CBC
HCT: 21.9 % — ABNORMAL LOW (ref 39.0–52.0)
Hemoglobin: 7.1 g/dL — ABNORMAL LOW (ref 13.0–17.0)
MCH: 29.5 pg (ref 26.0–34.0)
MCHC: 32.4 g/dL (ref 30.0–36.0)
MCV: 90.9 fL (ref 80.0–100.0)
Platelets: 295 10*3/uL (ref 150–400)
RBC: 2.41 MIL/uL — ABNORMAL LOW (ref 4.22–5.81)
RDW: 14.6 % (ref 11.5–15.5)
WBC: 8 10*3/uL (ref 4.0–10.5)
nRBC: 0 % (ref 0.0–0.2)

## 2019-01-30 LAB — BASIC METABOLIC PANEL
Anion gap: 7 (ref 5–15)
BUN: 24 mg/dL — ABNORMAL HIGH (ref 8–23)
CO2: 23 mmol/L (ref 22–32)
Calcium: 8.5 mg/dL — ABNORMAL LOW (ref 8.9–10.3)
Chloride: 106 mmol/L (ref 98–111)
Creatinine, Ser: 1.71 mg/dL — ABNORMAL HIGH (ref 0.61–1.24)
GFR calc Af Amer: 49 mL/min — ABNORMAL LOW (ref >60)
GFR calc non Af Amer: 42 mL/min — ABNORMAL LOW (ref >60)
Glucose, Bld: 113 mg/dL — ABNORMAL HIGH (ref 70–99)
Potassium: 4.8 mmol/L (ref 3.5–5.1)
Sodium: 136 mmol/L (ref 135–145)

## 2019-01-30 LAB — MAGNESIUM: Magnesium: 2.1 mg/dL (ref 1.7–2.4)

## 2019-01-30 LAB — HEMOGLOBIN: Hemoglobin: 7.9 g/dL — ABNORMAL LOW (ref 13.0–17.0)

## 2019-01-30 NOTE — Discharge Summary (Signed)
Physician Discharge Summary  Patient ID: Ryan [name] MRN: [id number] DOB/AGE: 1956/02/04 64 y.o.    Admit date: 01/28/2019 Discharge date: 01/30/2019  Admission Diagnoses:  Discharge Diagnoses:  Active Problems:   Ischemia of right lower extremity   Discharged Condition: good  Hospital Course: Short   Consults: rehabilitation medicine  Significant Diagnostic Studies: angiography: Severe PAD  Treatments: surgery: Angiography with endovascular intervention      Discharge Exam: Blood pressure 108/60, pulse (!) 105, temperature 98 F (36.7 C), resp. rate 18, height 5\' 8"  (1.727 m), weight 59.6 kg, SpO2 100 %. General appearance: alert, cooperative and appears stated age Head: Normocephalic, without obvious abnormality, atraumatic Eyes: conjunctivae/corneas clear. PERRL, EOM's intact. Fundi benign. Neck: no adenopathy, no carotid bruit, no JVD, supple, symmetrical, trachea midline and thyroid not enlarged, symmetric, no tenderness/mass/nodules Back: symmetric, no curvature. ROM normal. No CVA tenderness. Extremities: extremities normal, atraumatic, no cyanosis or edema and Doppler signals are strong bilaterally No wound in the right or left foot  Disposition:     Home with follow-up in one to two weeks with new arterial duplex and ABI.   Signed: Elmore Guise 01/30/2019, 10:32 AM

## 2019-01-30 NOTE — Discharge Instructions (Signed)
Follow-up with Dr. Lucky Cowboy in 1-2 weeks with new lower limb arterial duplex and ABI.

## 2019-01-30 NOTE — Progress Notes (Signed)
Patient alert and oriented. Tolerating diet, using urinal. Pulses dopplered. Patient has had some right lower leg pain, medication given, relieved pain. Left groin intact with no bleeding. Patient ambulated with physical therapy, with the use of a walker. Per Care Manager he will get  walker for home use. Patient to follow up with Dr. Lucky Cowboy in 1-2 weeks. Patient will call office for an appointment. Patient had no further questions.

## 2019-01-30 NOTE — Evaluation (Signed)
Physical Therapy Evaluation Patient Details Name: Ryan [name] MRN: [id number] DOB: [birth year] Today's Date: 01/30/2019   History of Present Illness  63 yo male with a PMH that includes HTN, PVD, and SOB.  Pt presented to the hospital last week with worsening ischemic pain at rest of the right foot and is now s/p multiple procedures for RLE revascularization, went home and is now back s/p R angioplasty.  Clinical Impression  Pt did well with PT exam and showed ability to return home safety with walker and assist (lives with daughter.)  Apparently pt attempted standing/walking earlier with QC and struggled, however with walker he was much safer and able to effectively put more weight through R LE and was able to attain past neutral DF with increasing "comfort" with WBing through heel.  Pt surprised with how much more comfortable he was with the walker and after returning to room (s/p ~65 ft) he reports that he probably even could have done more.  O2 remained in the high 90s t/o the effort and HR was 90-100s t/o the effort.  From PT stand-point pt is safe to return home once he has a walker.    Follow Up Recommendations Follow surgeon's recommendation for DC plan and follow-up therapies    Equipment Recommendations  Rolling walker with 5" wheels    Recommendations for Other Services       Precautions / Restrictions Precautions Precautions: Fall Restrictions Weight Bearing Restrictions: No      Mobility  Bed Mobility Overal bed mobility: Modified Independent             General bed mobility comments: able to rise to sitting w/o assist  Transfers Overall transfer level: Modified independent Equipment used: Rolling walker (2 wheeled) Transfers: Sit to/from Stand Sit to Stand: Min guard         General transfer comment: Min verbal cues for sequencing with pt steady demontrating good eccentric and concentric control  Ambulation/Gait Ambulation/Gait assistance: Min  guard Gait Distance (Feet): 65 Feet Assistive device: Rolling walker (2 wheeled)       General Gait Details: Pt was able to maintain slow but safe and relatively consistent cadence with ambulation.  Pt able to better weight bear through R foot (slowly graduating to increased [place] and less forefoot only WBing)  Stairs            Wheelchair Mobility    Modified Rankin (Stroke Patients Only)       Balance Overall balance assessment: Modified Independent   Sitting balance-Leahy Scale: Good     Standing balance support: Bilateral upper extremity supported Standing balance-Leahy Scale: Good Standing balance comment: reliant on walker with no LOBs or overt safety issues                             Pertinent Vitals/Pain Pain Assessment: 0-10 Pain Score: 6  Pain Location: RLE, mostly notedly at distal calf/heel    Home Living Family/patient expects to be discharged to:: Private residence Living Arrangements: Children Available Help at Discharge: Family;Available 24 hours/day Type of Home: House Home Access: Stairs to enter Entrance Stairs-Rails: Left Entrance Stairs-Number of Steps: 4 Home Layout: Two level;Able to live on main level with bedroom/bathroom Home Equipment: Cane - quad(needs walker)      Prior Function Level of Independence: Independent         Comments: Reports he is rarely out of the home but Ind Amb community distances without  an AD, no fall history, Ind with ADLs     Hand Dominance        Extremity/Trunk Assessment   Upper Extremity Assessment Upper Extremity Assessment: Overall WFL for tasks assessed    Lower Extremity Assessment Lower Extremity Assessment: Overall WFL for tasks assessed(R AROM with some pain, lacks neutral DF ~5 deg)       Communication   Communication: No difficulties  Cognition Arousal/Alertness: Awake/alert Behavior During Therapy: WFL for tasks assessed/performed Overall Cognitive Status: Within  Functional Limits for tasks assessed                                        General Comments      Exercises     Assessment/Plan    PT Assessment Patient needs continued PT services  PT Problem List Decreased strength;Decreased activity tolerance;Decreased balance;Decreased knowledge of use of DME       PT Treatment Interventions DME instruction;Gait training;Stair training;Functional mobility training;Therapeutic activities;Therapeutic exercise;Balance training;Patient/family education    PT Goals (Current goals can be found in the Care Plan section)  Acute Rehab PT Goals Patient Stated Goal: go home PT Goal Formulation: With patient Time For Goal Achievement: 02/13/19 Potential to Achieve Goals: Good    Frequency Min 2X/week   Barriers to discharge        Co-evaluation               AM-PAC PT "6 Clicks" Mobility  Outcome Measure Help needed turning from your back to your side while in a flat bed without using bedrails?: None Help needed moving from lying on your back to sitting on the side of a flat bed without using bedrails?: None Help needed moving to and from a bed to a chair (including a wheelchair)?: None Help needed standing up from a chair using your arms (e.g., wheelchair or bedside chair)?: A Little Help needed to walk in hospital room?: A Little Help needed climbing 3-5 steps with a railing? : A Little 6 Click Score: 21    End of Session Equipment Utilized During Treatment: Gait belt Activity Tolerance: Patient tolerated treatment well Patient left: in bed;with call bell/phone within reach Nurse Communication: Mobility status PT Visit Diagnosis: Muscle weakness (generalized) (M62.81);Difficulty in walking, not elsewhere classified (R26.2)    Time: 1027-2536 PT Time Calculation (min) (ACUTE ONLY): 27 min   Charges:   PT Evaluation $PT Eval Low Complexity: 1 Low PT Treatments $Gait Training: 8-22 mins        Kreg Shropshire, DPT 01/30/2019, 2:38 PM

## 2019-01-30 NOTE — Progress Notes (Signed)
1 Day Post-Op   Subjective/Chief Complaint: He is doing well and denies any issues with his feet.  He is eager to go home.   Objective: Vital signs in last 24 hours: Temp:  [98 F (36.7 C)-98.3 F (36.8 C)] 98 F (36.7 C) (08/08 0700) Pulse Rate:  [55-113] 105 (08/08 0900) Resp:  [5-34] 18 (08/08 0900) BP: (99-195)/(50-120) 108/60 (08/08 0900) SpO2:  [92 %-100 %] 100 % (08/08 0900) Last BM Date: 01/27/19(per pt)  Intake/Output from previous day: 08/07 0701 - 08/08 0700 In: 407.3 [P.O.:60; I.V.:297.3; IV Piggyback:50] Out: 2000 [Urine:2000] Intake/Output this shift: No intake/output data recorded.  General appearance: alert, cooperative and appears stated age Head: Normocephalic, without obvious abnormality, atraumatic Eyes: conjunctivae/corneas clear. PERRL, EOM's intact. Fundi benign. Neck: no adenopathy, no carotid bruit, no JVD, supple, symmetrical, trachea midline and thyroid not enlarged, symmetric, no tenderness/mass/nodules Back: symmetric, no curvature. ROM normal. No CVA tenderness. Resp: clear to auscultation bilaterally and normal percussion bilaterally Extremities: extremities normal, atraumatic, no cyanosis or edema and Doppler signals biphasic on the right and left.  Significant improvment compared to prior intervention. Pulses: 2+ and symmetric Neurologic: Alert and oriented X 3, normal strength and tone. Normal symmetric reflexes. Normal coordination and gait Incision/Wound:  Lab Results:  Recent Labs    01/29/19 0740 01/30/19 0350  WBC 8.2 8.0  HGB 8.3* 7.1*  HCT 26.0* 21.9*  PLT 335 295   BMET Recent Labs    01/29/19 0310 01/30/19 0350  NA 135 136  K 5.1 4.8  CL 104 106  CO2 21* 23  GLUCOSE 129* 113*  BUN 25* 24*  CREATININE 1.76* 1.71*  CALCIUM 8.7* 8.5*   PT/INR No results for input(s): LABPROT, INR in the last 72 hours. ABG No results for input(s): PHART, HCO3 in the last 72 hours.  Invalid input(s): PCO2,  PO2  Studies/Results: Vas [place] Abi With/wo Tbi  Result Date: 01/29/2019 LOWER EXTREMITY DOPPLER STUDY Indications: Rest pain, peripheral artery disease, and right leg pain;              01/20/2019; 01/21/2019 recent thrombectomy and PTA of right.  Vascular Interventions: 05/07/18: Right SFA/popliteal PTA/stent;                         06/25/18: Right SFA/popliteal                         thrombolysis/thrombectomy/PTA with right TP trunk &                         anterior tibial artery PTAs;                         07/01/18: TPA placement in right SFA, popliteal, & ATA                         with overnight thrombolytic therapy;                         07/02/18: Right SFA, popliteal & ATA thrombectomy;                         08/12/18: Right SFA/popliteal thrombolysis/thrombectomy,  right SFA stent placement, and right TP trunk, posterior                         tibial & peroneal artery PTAs;                         09/03/18: Right SFA, popliteal & ATA                         thrombolysis/thrombectomy with overnight infusion of                         TPA;                         09/04/18: Right SFA, popliteal, ATA, TP trunk & posterior                         tibial artery thrombectomies/PTAs;                         10/01/18: Rt SFA, Popliteal Artery, Tib/Per Trunk & PTA                         thrombectomies with right proximal SFA & TP trunk                         stents;                         10/16/18: Right CFA,SFA, Rt ATA thrombectomies;. Comparison Study: 01/18/2019 Performing Technologist: Concha Norway RVT  Examination Guidelines: A complete evaluation includes at minimum, Doppler waveform signals and systolic blood pressure reading at the level of bilateral brachial, anterior tibial, and posterior tibial arteries, when vessel segments are accessible. Bilateral testing is considered an integral part of a complete examination. Photoelectric Plethysmograph (PPG) waveforms and toe systolic  pressure readings are included as required and additional duplex testing as needed. Limited examinations for reoccurring indications may be performed as noted.  ABI Findings: +--------+------------------+-----+-------------------+--------+ Right   Rt Pressure (mmHg)IndexWaveform           Comment  +--------+------------------+-----+-------------------+--------+ [id number]                                                +--------+------------------+-----+-------------------+--------+ ATA     10                0.06 dampened monophasic         +--------+------------------+-----+-------------------+--------+ PTA                            absent                      +--------+------------------+-----+-------------------+--------+ +---------+------------------+-----+---------+-------+ Left     Lt Pressure (mmHg)IndexWaveform Comment +---------+------------------+-----+---------+-------+ Brachial 157                                     +---------+------------------+-----+---------+-------+ ATA      195  1.24 triphasic        +---------+------------------+-----+---------+-------+ PTA                             triphasicnoncomp +---------+------------------+-----+---------+-------+ Great [id number]               0.85 Normal           +---------+------------------+-----+---------+-------+ +-------+-----------+-----------+------------+------------+ ABI/TBIToday's ABIToday's TBIPrevious ABIPrevious TBI +-------+-----------+-----------+------------+------------+ Right  .06        absent     .31                      +-------+-----------+-----------+------------+------------+ Left   1.24       .85        1.13                     +-------+-----------+-----------+------------+------------+  Summary: Right: Resting right ankle-brachial index indicates critical limb ischemia. Mid ATA by duplex seen with low monphasic flow, PTA is occluded. Left: Resting  left ankle-brachial index is within normal range. No evidence of significant left lower extremity arterial disease. PTA is noncomp but ATA is used for ABI.  *See table(s) above for measurements and observations.  Electronically signed by Leotis Pain MD on 01/29/2019 at 12:37:37 PM.    Final     Anti-infectives: Anti-infectives (From admission, onward)   Start     Dose/Rate Route Frequency Ordered Stop   01/29/19 1215  ceFAZolin (ANCEF) IVPB 1 g/50 mL premix     1 g 100 mL/hr over 30 Minutes Intravenous  Once 01/29/19 1208 01/29/19 1453   01/28/19 1330  ceFAZolin (ANCEF) IVPB 1 g/50 mL premix     1 g 100 mL/hr over 30 Minutes Intravenous  Once 01/28/19 1317 01/28/19 2100   01/28/19 1319  ceFAZolin (ANCEF) 1-4 GM/50ML-% IVPB    Note to Pharmacy: Freddi Starr   : cabinet override      01/28/19 1319 01/29/19 0129      Assessment/Plan: s/p Procedure(s): Lower Extremity Angiography (Right) d/c foley Discharge  LOS: 2 days    Elmore Guise 01/30/2019

## 2019-02-01 ENCOUNTER — Telehealth (INDEPENDENT_AMBULATORY_CARE_PROVIDER_SITE_OTHER): Payer: Self-pay | Admitting: Nurse Practitioner

## 2019-02-01 ENCOUNTER — Encounter (INDEPENDENT_AMBULATORY_CARE_PROVIDER_SITE_OTHER): Payer: Self-pay | Admitting: Nurse Practitioner

## 2019-02-01 ENCOUNTER — Encounter: Payer: Self-pay | Admitting: Vascular Surgery

## 2019-02-01 NOTE — Telephone Encounter (Signed)
Can we call the patient to see if he needs fmla paperwork or if just needs a work note.  Also how long he would like to be out for.

## 2019-02-01 NOTE — Telephone Encounter (Signed)
That is fine. If we can get a letter for him stating this information. Thanks

## 2019-02-01 NOTE — Telephone Encounter (Signed)
Can you please give patient a letter stating he is to be out of work until 02/10/19-returning to work 02/11/19. Thank you AS, CMA

## 2019-02-01 NOTE — Telephone Encounter (Signed)
Patient states he is out of time with his FMLA and is requesting a note putting him on 'Medical Leave' until 02/10/19. Pt stated he would return to work on 02/11/19.  Please advise. AS, CMA

## 2019-02-01 NOTE — Telephone Encounter (Signed)
Patient was sent to the ED on Thursday.

## 2019-02-08 ENCOUNTER — Ambulatory Visit (INDEPENDENT_AMBULATORY_CARE_PROVIDER_SITE_OTHER): Payer: BLUE CROSS/BLUE SHIELD | Admitting: Nurse Practitioner

## 2019-02-08 ENCOUNTER — Encounter (INDEPENDENT_AMBULATORY_CARE_PROVIDER_SITE_OTHER): Payer: BLUE CROSS/BLUE SHIELD

## 2019-02-09 ENCOUNTER — Telehealth (INDEPENDENT_AMBULATORY_CARE_PROVIDER_SITE_OTHER): Payer: Self-pay | Admitting: Nurse Practitioner

## 2019-02-09 NOTE — Telephone Encounter (Signed)
Spoke with Ryan [name]- She advised that patient would need to be seen for the pain to make sure he isnt having ischemic issues. She stated that patient was in process of transferring care and if he would rather he can contact that office. Or patient could be seen at [facility] or the ED.   Advised patient. He said he knew this was not a blood clot and he needed to decide what to do and he would call our office back. AS, CMA

## 2019-02-09 NOTE — Telephone Encounter (Signed)
Patient calling requesting pain medication due to sharp pain in L leg for 3-4 days. Patient stated he doesn't think he needs an [place] and he's taking Ibuprofen but the meds not helping. Pt stated he doesn't think its a blood clot but maybe its a pinched nerve.  Please advise. AS, CMA

## 2019-02-10 ENCOUNTER — Encounter: Payer: Self-pay | Admitting: Family Medicine

## 2019-02-10 ENCOUNTER — Other Ambulatory Visit: Payer: Self-pay | Admitting: Family Medicine

## 2019-02-10 ENCOUNTER — Other Ambulatory Visit: Payer: Self-pay

## 2019-02-10 ENCOUNTER — Ambulatory Visit: Payer: BC Managed Care – PPO | Admitting: Family Medicine

## 2019-02-10 VITALS — BP 150/82 | HR 88 | Temp 96.2°F | Resp 18 | Ht 68.0 in | Wt 130.7 lb

## 2019-02-10 DIAGNOSIS — I10 Essential (primary) hypertension: Secondary | ICD-10-CM | POA: Diagnosis not present

## 2019-02-10 DIAGNOSIS — J449 Chronic obstructive pulmonary disease, unspecified: Secondary | ICD-10-CM

## 2019-02-10 DIAGNOSIS — I700 Atherosclerosis of aorta: Secondary | ICD-10-CM | POA: Diagnosis not present

## 2019-02-10 DIAGNOSIS — M79605 Pain in left leg: Secondary | ICD-10-CM

## 2019-02-10 DIAGNOSIS — I739 Peripheral vascular disease, unspecified: Secondary | ICD-10-CM

## 2019-02-10 MED ORDER — AMLODIPINE BESYLATE 2.5 MG PO TABS: 2.5000 mg | ORAL_TABLET | Freq: Every day | ORAL | 0 refills | Status: DC

## 2019-02-10 MED ORDER — ACETAMINOPHEN 500 MG PO TABS: 500.0000 mg | ORAL_TABLET | Freq: Two times a day (BID) | ORAL | 0 refills | Status: DC

## 2019-02-10 MED ORDER — ATORVASTATIN CALCIUM 20 MG PO TABS: 20.0000 mg | ORAL_TABLET | Freq: Every day | ORAL | 1 refills | Status: DC

## 2019-02-10 MED ORDER — LISINOPRIL 20 MG PO TABS: 20.0000 mg | ORAL_TABLET | Freq: Every day | ORAL | 0 refills | Status: DC

## 2019-02-10 MED ORDER — DICLOFENAC SODIUM 1 % TD GEL: 4.0000 g | Freq: Four times a day (QID) | TRANSDERMAL | 0 refills | Status: DC

## 2019-02-10 NOTE — Patient Instructions (Signed)
Quadriceps Strain Rehab Ask your health care provider which exercises are safe for you. Do exercises exactly as told by your health care provider and adjust them as directed. It is normal to feel mild stretching, pulling, tightness, or discomfort as you do these exercises. Stop right away if you feel sudden pain or your pain gets worse. Do not begin these exercises until told by your health care provider. Stretching and range-of-motion exercises These exercises warm up your muscles and joints and improve the movement and flexibility of your thigh. These exercises can also help to relieve stiffness or swelling. Heel slides  1. Lie on your back with both legs straight. If this causes back discomfort, bend the knee of your healthy leg so your foot is flat on the floor. 2. Slowly slide your left / right heel back toward your buttocks. Stop when you feel a gentle stretch in the front of your knee or thigh (quadriceps). 3. Hold this position for __________ seconds. 4. Slowly slide your left / right heel back to the starting position. Repeat __________ times. Complete this exercise __________ times a day. Quadriceps stretch, prone  1. Lie on your abdomen on a firm surface, such as a bed or padded floor (prone position). 2. Bend your left / right knee and hold your ankle. If you cannot reach your ankle or pant leg, loop a belt around your foot and grab the belt instead. 3. Gently pull your heel toward your buttocks. Your knee should not slide out to the side. You should feel a stretch in the front of your thigh and knee (quadriceps). 4. Hold this position for __________ seconds. Repeat __________ times. Complete this exercise __________ times a day. Strengthening exercises These exercises build strength and endurance in your thigh. Endurance is the ability to use your muscles for a long time, even after your muscles get tired. Straight leg raises, supine This exercise stretches the muscles in front of  your thigh (quadriceps) and the muscles that move your hips (hip flexors). Quality counts! Watch for signs that the quadriceps muscle is working to ensure that you are strengthening the correct muscles and not cheating by using healthier muscles. 1. Lie on your back (supine position) with your left / right leg extended and your other knee bent. 2. Tense the muscles in the front of your left / right thigh. You should see your kneecap slide up or see increased dimpling just above the knee. 3. Tighten these muscles even more and raise your leg 4-6 inches (10-15 cm) off the floor. 4. Hold this position for __________ seconds. 5. Keep the thigh muscles tense as you lower your leg. 6. Relax the muscles slowly and completely after each repetition. Repeat __________ times. Complete this exercise __________ times a day. Leg raises, prone This exercise strengthens the muscles that move the hips (hip extensors). 1. Lie on your abdomen on a bed or a firm surface (prone position). Place a pillow under your hips. 2. Bend your left / right knee so your foot is straight up in the air. 3. Squeeze your buttocks muscles and lift your left / right thigh off the bed. Do not let your back arch. 4. Hold this position for __________ seconds. 5. Slowly return to the starting position. Let your muscles relax completely before doing another repetition. Repeat __________ times. Complete this exercise __________ times a day. Wall sits Follow the directions for form closely. Knee pain can occur if your feet or knees are not placed properly.  1. Lean your back against a smooth wall or door, and walk your feet out 18-24 inches (46-61 cm) from it. 2. Place your feet hip-width apart. 3. Slowly slide down the wall or door until your knees bend __________ degrees. Keep your weight back and over your heels, not over your toes. Keep your thighs straight or pointing slightly outward. 4. Hold this position for __________ seconds. 5.  Use your thigh and buttocks muscles to push yourself back up to a standing position. Keep your weight through your heels while you do this. 6. Rest for __________ seconds after each repetition. Repeat __________ times. Complete this exercise __________ times a day. This information is not intended to replace advice given to you by your health care provider. Make sure you discuss any questions you have with your health care provider. Document Released: 06/10/2005 Document Revised: 10/02/2018 Document Reviewed: 04/02/2018 [employer] Patient Education  2020 [employer].

## 2019-02-10 NOTE — Progress Notes (Signed)
Name: Ryan [name]   MRN: [id number]    DOB: Mar 18, 1956   Date:02/10/2019       Progress Note  Subjective  Chief Complaint  Chief Complaint  Patient presents with  . Leg Pain    Onset-1 week, left leg has been bothering him and has to keep it straight or pain radiates from his hip down to thru his leg. Has tried ibuprofen with no relief.     HPI  HTN: bp is still elevated, he states he does not think he is taking amlodipine 2.5 mg , but has been taking lisinopril, we were also going to send hctz 12.5 mg but not sure how compliant he is. He denies chest pain or palpitation. He is willing to speak to Trish Fountain, RN to get assistance about medication management and life style modifications.   Left anterior thigh pain: he states it has been going on for at least one week, it is sore to touch, usually at rest and with certain movements, not when walking. He has a history of PAD but this feels different No rashes. Initially was on left outer hip but now on quad area. No numbness, tingling or weakness. Does not recall any injuries or falls  PAD: he just had another procedure done on the right leg and is feeling better, on Eliquis as prescribed, but has not been taking atorvastatin, we will send a refill  Atherosclerosis of Aorta: resume statin , explained importance of compliance  Patient Active Problem List   Diagnosis Date Noted  . Ischemia of right lower extremity 01/28/2019  . Iron deficiency anemia 11/23/2018  . Stage 3 chronic kidney disease ([place]) 10/21/2018  . Atherosclerosis of native arteries of extremity with rest pain ([place]) 10/13/2018  . PAD (peripheral artery disease) ([place]) 07/21/2018  . Ischemic leg 06/25/2018  . Claudication ([place]) 03/17/2018  . B12 deficiency 03/12/2018  . Vitamin B1 deficiency 03/12/2018  . Varicose veins of leg with pain, right 03/12/2018  . Enlarged thoracic aorta ([place]) 02/24/2018  . Alcoholism /alcohol abuse ([place]) 02/24/2018  . Paresthesia  02/24/2018  . Ectatic aorta ([place]) 02/20/2018  . Emphysema lung ([place]) 02/20/2018  . Atherosclerosis of aorta ([place]) 02/20/2018  . Encounter for tobacco use cessation counseling   . Benign neoplasm of cecum   . Benign neoplasm of transverse colon   . Benign neoplasm of sigmoid colon   . Hypertension, benign 08/31/2015  . Tobacco use 08/31/2015  . Abnormal CXR 08/31/2015    Past Surgical History:  Procedure Laterality Date  . COLONOSCOPY WITH PROPOFOL N/A 02/15/2016   Procedure: COLONOSCOPY WITH PROPOFOL;  Surgeon: Lucilla Lame, MD;  Location: [facility];  Service: Endoscopy;  Laterality: N/A;  . HERNIA REPAIR  1999   left inguinal  . LOWER EXTREMITY ANGIOGRAPHY Right 05/07/2018   Procedure: LOWER EXTREMITY ANGIOGRAPHY;  Surgeon: Algernon Huxley, MD;  Location: [facility];  Service: Cardiovascular;  Laterality: Right;  . LOWER EXTREMITY ANGIOGRAPHY Right 06/25/2018   Procedure: LOWER EXTREMITY ANGIOGRAPHY;  Surgeon: Algernon Huxley, MD;  Location: [facility];  Service: Cardiovascular;  Laterality: Right;  . LOWER EXTREMITY ANGIOGRAPHY Right 07/01/2018   Procedure: LOWER EXTREMITY ANGIOGRAPHY;  Surgeon: Algernon Huxley, MD;  Location: [facility];  Service: Cardiovascular;  Laterality: Right;  . LOWER EXTREMITY ANGIOGRAPHY Right 08/12/2018   Procedure: LOWER EXTREMITY ANGIOGRAPHY;  Surgeon: Algernon Huxley, MD;  Location: [facility];  Service: Cardiovascular;  Laterality: Right;  . LOWER  EXTREMITY ANGIOGRAPHY Right 09/03/2018   Procedure: LOWER EXTREMITY ANGIOGRAPHY;  Surgeon: Algernon Huxley, MD;  Location: [facility];  Service: Cardiovascular;  Laterality: Right;  . LOWER EXTREMITY ANGIOGRAPHY Right 09/04/2018   Procedure: Lower Extremity Angiography;  Surgeon: Algernon Huxley, MD;  Location: [facility];  Service: Cardiovascular;  Laterality: Right;  . LOWER EXTREMITY ANGIOGRAPHY Right 10/01/2018   Procedure: LOWER EXTREMITY ANGIOGRAPHY;  Surgeon:  Algernon Huxley, MD;  Location: [facility];  Service: Cardiovascular;  Laterality: Right;  . LOWER EXTREMITY ANGIOGRAPHY Right 10/01/2018   Procedure: Lower Extremity Angiography;  Surgeon: Algernon Huxley, MD;  Location: [facility];  Service: Cardiovascular;  Laterality: Right;  . LOWER EXTREMITY ANGIOGRAPHY Right 10/15/2018   Procedure: LOWER EXTREMITY ANGIOGRAPHY;  Surgeon: Algernon Huxley, MD;  Location: [facility];  Service: Cardiovascular;  Laterality: Right;  . LOWER EXTREMITY ANGIOGRAPHY Left 10/16/2018   Procedure: Lower Extremity Angiography;  Surgeon: Algernon Huxley, MD;  Location: [facility];  Service: Cardiovascular;  Laterality: Left;  . LOWER EXTREMITY ANGIOGRAPHY Left 01/20/2019   Procedure: LOWER EXTREMITY ANGIOGRAPHY;  Surgeon: Algernon Huxley, MD;  Location: [facility];  Service: Cardiovascular;  Laterality: Left;  . LOWER EXTREMITY ANGIOGRAPHY Right 01/21/2019   Procedure: Lower Extremity Angiography;  Surgeon: Algernon Huxley, MD;  Location: [facility];  Service: Cardiovascular;  Laterality: Right;  . LOWER EXTREMITY ANGIOGRAPHY Right 01/28/2019   Procedure: LOWER EXTREMITY ANGIOGRAPHY;  Surgeon: Algernon Huxley, MD;  Location: [facility];  Service: Cardiovascular;  Laterality: Right;  . LOWER EXTREMITY ANGIOGRAPHY Right 01/29/2019   Procedure: Lower Extremity Angiography;  Surgeon: Algernon Huxley, MD;  Location: [facility];  Service: Cardiovascular;  Laterality: Right;  . LOWER EXTREMITY INTERVENTION Right 07/02/2018   Procedure: LOWER EXTREMITY INTERVENTION;  Surgeon: Algernon Huxley, MD;  Location: [facility];  Service: Cardiovascular;  Laterality: Right;  . POLYPECTOMY N/A 02/15/2016   Procedure: POLYPECTOMY;  Surgeon: Lucilla Lame, MD;  Location: [facility];  Service: Endoscopy;  Laterality: N/A;    Family History  Problem Relation Age of Onset  . COPD Mother   . Hypertension Father   . Heart attack Brother      Social History   Socioeconomic History  . Marital status: Legally Separated    Spouse name: [name]  . Number of children: 5  . Years of education: Not on file  . Highest education level: High school graduate  Occupational History  . Occupation: Forklift    Comment: [name]  Social Needs  . Financial resource strain: Not hard at all  . Food insecurity    Worry: Never true    Inability: Never true  . Transportation needs    Medical: No    Non-medical: No  Tobacco Use  . Smoking status: Former Smoker    Packs/day: 0.10    Years: 40.00    Pack years: 4.00    Types: Cigarettes    Start date: 07/21/1978    Quit date: 08/28/2018    Years since quitting: 0.4  . Smokeless tobacco: Never Used  Substance and Sexual Activity  . Alcohol use: Yes    Alcohol/week: 12.0 standard drinks    Types: 12 Cans of beer per week  . Drug use: Never  . Sexual activity: Yes    Partners: Female    Birth control/protection: None  Lifestyle  . Physical activity  Days per week: 6 days    Minutes per session: 60 min  . Stress: Not at all  Relationships  . Social Herbalist on phone: Three times a week    Gets together: Twice a week    Attends religious service: Never    Active member of club or organization: No    Attends meetings of clubs or organizations: Never    Relationship status: Married  . Intimate partner violence    Fear of current or ex partner: No    Emotionally abused: No    Physically abused: No    Forced sexual activity: No  Other Topics Concern  . Not on file  Social History Narrative  . Not on file     Current Outpatient Medications:  .  amLODipine (NORVASC) 2.5 MG tablet, Take 1 tablet (2.5 mg total) by mouth daily., Disp: 30 tablet, Rfl: 0 .  apixaban (ELIQUIS) 5 MG TABS tablet, Take 1 tablet (5 mg total) by mouth 2 (two) times daily., Disp: 180 tablet, Rfl: 3 .  aspirin EC 81 MG tablet, Take 1 tablet (81 mg total) by mouth daily., Disp: 90  tablet, Rfl: 3 .  atorvastatin (LIPITOR) 20 MG tablet, Take 1 tablet (20 mg total) by mouth daily., Disp: 90 tablet, Rfl: 3 .  Cyanocobalamin (VITAMIN B-12) 1000 MCG SUBL, Place 1 tablet (1,000 mcg total) under the tongue daily., Disp: 90 tablet, Rfl: 0 .  Fluticasone-Umeclidin-Vilant (TRELEGY ELLIPTA) 100-62.5-25 MCG/INH AEPB, Inhale 1 puff into the lungs daily., Disp: 60 each, Rfl: 2 .  ibuprofen (ADVIL) 200 MG tablet, Take 200 mg by mouth every 4 (four) hours as needed., Disp: , Rfl:  .  lisinopril (ZESTRIL) 10 MG tablet, Take 1 tablet (10 mg total) by mouth daily., Disp: 180 tablet, Rfl: 0 .  thiamine (VITAMIN B-1) 50 MG tablet, Take 1 tablet (50 mg total) by mouth daily., Disp: 30 tablet, Rfl: 2  Current Facility-Administered Medications:  .  oxyCODONE (Oxy IR/ROXICODONE) immediate release tablet 5 mg, 5 mg, Oral, Q4H PRN, Stegmayer, Kimberly A, PA-C  No Known Allergies  I personally reviewed active problem list, medication list, allergies, family history, social history with the patient/caregiver today.   ROS  Constitutional: Negative for fever or weight change.  Respiratory: Negative for cough and shortness of breath.   Cardiovascular: Negative for chest pain or palpitations.  Gastrointestinal: Negative for abdominal pain, no bowel changes.  Musculoskeletal: Negative for gait problem or joint swelling.  Skin: Negative for rash.  Neurological: Negative for dizziness or headache.  No other specific complaints in a complete review of systems (except as listed in HPI above).  Objective  Vitals:   02/10/19 0952  BP: (!) 150/82  Pulse: 88  Resp: 18  Temp: (!) 96.2 F (35.7 C)  TempSrc: Temporal  SpO2: 99%  Weight: 130 lb 11.2 oz (59.3 kg)  Height: 5\' 8"  (1.727 m)    Body mass index is 19.87 kg/m.  Physical Exam  Constitutional: Patient appears well-developed and very thin, but weight stable for over one year  No distress.  HEENT: head atraumatic, normocephalic, pupils  equal and reactive to light, neck supple Cardiovascular: Normal rate, regular rhythm and normal heart sounds.  No murmur heard. No BLE edema. Pulmonary/Chest: Effort normal and breath sounds normal. No respiratory distress. Abdominal: Soft.  There is no tenderness. Muscular skeletal: pain during palpation of left anterior thigh, normal rom of spine and hip, no rashes, pain when standing and flexing hip  and knee  Psychiatric: Patient has a normal mood and affect. behavior is normal. Judgment and thought content normal.  Recent Results (from the past 2160 hour(s))  COMPLETE METABOLIC PANEL WITH GFR     Status: Abnormal   Collection Time: 11/20/18  4:01 PM  Result Value Ref Range   Glucose, Bld 78 65 - 99 mg/dL    Comment: .            Fasting reference interval .    BUN 24 7 - 25 mg/dL   Creat 1.67 (H) 0.70 - 1.25 mg/dL    Comment: For patients >40 years of age, the reference limit for Creatinine is approximately 13% higher for people identified as African-American. .    GFR, Est Non African American 44 (L) > OR = 60 mL/min/1.68m2   GFR, Est African American 50 (L) > OR = 60 mL/min/1.19m2   BUN/Creatinine Ratio 14 6 - 22 (calc)   Sodium 138 135 - 146 mmol/L   Potassium 5.5 (H) 3.5 - 5.3 mmol/L   Chloride 109 98 - 110 mmol/L   CO2 22 20 - 32 mmol/L   Calcium 8.9 8.6 - 10.3 mg/dL   Total Protein 7.2 6.1 - 8.1 g/dL   Albumin 4.2 3.6 - 5.1 g/dL   Globulin 3.0 1.9 - 3.7 g/dL (calc)   AG Ratio 1.4 1.0 - 2.5 (calc)   Total Bilirubin 0.3 0.2 - 1.2 mg/dL   Alkaline phosphatase (APISO) 71 35 - 144 U/L   AST 21 10 - 35 U/L   ALT 13 9 - 46 U/L  CBC with Differential/Platelet     Status: Abnormal   Collection Time: 11/20/18  4:01 PM  Result Value Ref Range   WBC 6.2 3.8 - 10.8 Thousand/uL   RBC 3.47 (L) 4.20 - 5.80 Million/uL   Hemoglobin 9.8 (L) 13.2 - 17.1 g/dL   HCT 30.6 (L) 38.5 - 50.0 %   MCV 88.2 80.0 - 100.0 fL   MCH 28.2 27.0 - 33.0 pg   MCHC 32.0 32.0 - 36.0 g/dL   RDW 13.2  11.0 - 15.0 %   Platelets 248 140 - 400 Thousand/uL   MPV 10.4 7.5 - 12.5 fL   Neutro Abs 4,204 1,500 - 7,800 cells/uL   Lymphs Abs 1,376 850 - 3,900 cells/uL   Absolute Monocytes 471 200 - 950 cells/uL   Eosinophils Absolute 112 15 - 500 cells/uL   Basophils Absolute 37 0 - 200 cells/uL   Neutrophils Relative % 67.8 %   Total Lymphocyte 22.2 %   Monocytes Relative 7.6 %   Eosinophils Relative 1.8 %   Basophils Relative 0.6 %  Iron, TIBC and Ferritin Panel     Status: Abnormal   Collection Time: 11/20/18  4:01 PM  Result Value Ref Range   Iron 18 (L) 50 - 180 mcg/dL   TIBC 374 250 - 425 mcg/dL (calc)   %SAT 5 (L) 20 - 48 % (calc)   Ferritin 9 (L) 24 - 380 ng/mL  Vitamin B1     Status: None   Collection Time: 11/20/18  4:01 PM  Result Value Ref Range   Vitamin B1 (Thiamine) CANCELED     Comment: TEST NOT PERFORMED . Specimen was not protected from light.  Result canceled by the ancillary.   Lipid panel     Status: None   Collection Time: 11/20/18  4:01 PM  Result Value Ref Range   Cholesterol 174 <200 mg/dL   HDL 92 >  OR = 40 mg/dL   Triglycerides 39 <150 mg/dL   LDL Cholesterol (Calc) 71 mg/dL (calc)    Comment: Reference range: <100 . Desirable range <100 mg/dL for primary prevention;   <70 mg/dL for patients with CHD or diabetic patients  with > or = 2 CHD risk factors. Marland Kitchen LDL-C is now calculated using the Martin-Hopkins  calculation, which is a validated novel method providing  better accuracy than the Friedewald equation in the  estimation of LDL-C.  Cresenciano Genre et al. Annamaria Helling. 9485;462(70): 2061-2068  (http://education.[employer].com/faq/FAQ164)    Total CHOL/HDL Ratio 1.9 <5.0 (calc)   Non-HDL Cholesterol (Calc) 82 <130 mg/dL (calc)    Comment: For patients with diabetes plus 1 major ASCVD risk  factor, treating to a non-HDL-C goal of <100 mg/dL  (LDL-C of <70 mg/dL) is considered a therapeutic  option.   Vitamin B1     Status: None   Collection Time:  11/27/18  2:19 PM  Result Value Ref Range   Vitamin B1 (Thiamine) 19 8 - 30 nmol/L    Comment: Marland Kitchen Vitamin supplementation within 24 hours prior to blood draw may affect the accuracy of the results. . This test was developed and its analytical performance characteristics have been determined by [place], [place]. It has not been cleared or approved by the U.S. Food and Drug Administration. This assay has been validated pursuant to the CLIA regulations and is used for clinical purposes. Marland Kitchen   SARS Coronavirus 2 (Performed in [facility] hospital lab)     Status: None   Collection Time: 01/18/19  3:12 PM   Specimen: Nasal Swab  Result Value Ref Range   SARS Coronavirus 2 NEGATIVE NEGATIVE    Comment: (NOTE) SARS-CoV-2 target nucleic acids are NOT DETECTED. The SARS-CoV-2 RNA is generally detectable in upper and lower respiratory specimens during the acute phase of infection. Negative results do not preclude SARS-CoV-2 infection, do not rule out co-infections with other pathogens, and should not be used as the sole basis for treatment or other patient management decisions. Negative results must be combined with clinical observations, patient history, and epidemiological information. The expected result is Negative. Fact Sheet for Patients: SugarRoll.be Fact Sheet for Healthcare Providers: https://www.woods-mathews.com/ This test is not yet approved or cleared by the [place] FDA and  has been authorized for detection and/or diagnosis of SARS-CoV-2 by FDA under an Emergency Use Authorization (EUA). This EUA will remain  in effect (meaning this test can be used) for the duration of the COVID-19 declaration under Section 56 4(b)(1) of the Act, 21 U.S.C. section 360bbb-3(b)(1), unless the authorization is terminated or revoked sooner. Performed at [facility], [address] [place],  [place] [address]   BUN     Status: None   Collection Time: 01/20/19 11:32 AM  Result Value Ref Range   BUN 23 8 - 23 mg/dL    Comment: Performed at [facility], [address] [place], [place] [address]  Creatinine, serum     Status: Abnormal   Collection Time: 01/20/19 11:32 AM  Result Value Ref Range   Creatinine, Ser 1.49 (H) 0.61 - 1.24 mg/dL   GFR calc non Af Amer 50 (L) >60 mL/min   GFR calc Af Amer 58 (L) >60 mL/min    Comment: Performed at [facility], [address] [place],  [address]  Glucose, capillary     Status: Abnormal   Collection Time: 01/20/19  4:14 PM  Result  Value Ref Range   Glucose-Capillary 113 (H) 70 - 99 mg/dL  MRSA PCR Screening     Status: None   Collection Time: 01/20/19  4:25 PM   Specimen: Nasal Mucosa; Nasopharyngeal  Result Value Ref Range   MRSA by PCR NEGATIVE NEGATIVE    Comment:        The GeneXpert MRSA Assay (FDA approved for NASAL specimens only), is one component of a comprehensive MRSA colonization surveillance program. It is not intended to diagnose MRSA infection nor to guide or monitor treatment for MRSA infections. Performed at [facility], [place], [place] [address]   Heparin level (unfractionated) every 6 hours x 4 post-procedure     Status: None   Collection Time: 01/20/19  4:30 PM  Result Value Ref Range   Heparin Unfractionated 0.50 0.30 - 0.70 IU/mL    Comment: (NOTE) If heparin results are below expected values, and patient dosage has  been confirmed, suggest follow up testing of antithrombin III levels. Performed at [facility], [address] [place], [place] [address]   CBC every 6 hours x 4 post-procedure     Status: Abnormal   Collection Time: 01/20/19  4:30 PM  Result Value Ref Range   WBC 4.8 4.0 - 10.5 K/uL   RBC 3.83 (L) 4.22 - 5.81 MIL/uL   Hemoglobin 11.5 (L) 13.0 - 17.0 g/dL   HCT 35.1 (L) 39.0 - 52.0 %   MCV 91.6 80.0 - 100.0 fL   MCH  30.0 26.0 - 34.0 pg   MCHC 32.8 30.0 - 36.0 g/dL   RDW 15.3 11.5 - 15.5 %   Platelets 201 150 - 400 K/uL   nRBC 0.0 0.0 - 0.2 %    Comment: Performed at [facility], [address] [place], [place] [address]  Fibrinogen every 6 hours x 4 post-procedure     Status: None   Collection Time: 01/20/19  4:30 PM  Result Value Ref Range   Fibrinogen 342 210 - 475 mg/dL    Comment: Performed at [facility], [address] [place], [place] [address]  Protime-INR     Status: None   Collection Time: 01/20/19  4:31 PM  Result Value Ref Range   Prothrombin Time 14.5 11.4 - 15.2 seconds   INR 1.1 0.8 - 1.2    Comment: (NOTE) INR goal varies based on device and disease states. Performed at [facility], [address] [place], [place] [address]   APTT     Status: Abnormal   Collection Time: 01/20/19  4:31 PM  Result Value Ref Range   aPTT 85 (H) 24 - 36 seconds    Comment:        IF BASELINE aPTT IS ELEVATED, SUGGEST PATIENT RISK ASSESSMENT BE USED TO DETERMINE APPROPRIATE ANTICOAGULANT THERAPY. Performed at [facility], [place], [place] [address]   Heparin level (unfractionated) every 6 hours x 4 post-procedure     Status: None   Collection Time: 01/20/19  7:08 PM  Result Value Ref Range   Heparin Unfractionated 0.34 0.30 - 0.70 IU/mL    Comment: (NOTE) If heparin results are below expected values, and patient dosage has  been confirmed, suggest follow up testing of antithrombin III levels. Performed at [facility], [address] [place], [place] [address]   CBC every 6 hours x 4 post-procedure     Status: Abnormal   Collection Time: 01/20/19  7:08 PM  Result Value Ref Range   WBC 6.8 4.0 - 10.5 K/uL   RBC 3.67 (L) 4.22 - 5.81 MIL/uL   Hemoglobin 11.1 (L) 13.0 - 17.0 g/dL   HCT 33.6 (L) 39.0 - 52.0 %   MCV 91.6 80.0 - 100.0 fL   MCH 30.2 26.0 - 34.0 pg   MCHC 33.0 30.0 - 36.0 g/dL   RDW 15.1 11.5 - 15.5 %    Platelets 186 150 - 400 K/uL   nRBC 0.0 0.0 - 0.2 %    Comment: Performed at [facility], [address] [place], [place] [address]  Fibrinogen every 6 hours x 4 post-procedure     Status: None   Collection Time: 01/20/19  7:08 PM  Result Value Ref Range   Fibrinogen 252 210 - 475 mg/dL    Comment: Performed at [facility], [place], [place] [address]  Heparin level (unfractionated) every 6 hours x 4 post-procedure     Status: Abnormal   Collection Time: 01/21/19  1:31 AM  Result Value Ref Range   Heparin Unfractionated 0.21 (L) 0.30 - 0.70 IU/mL    Comment: (NOTE) If heparin results are below expected values, and patient dosage has  been confirmed, suggest follow up testing of antithrombin III levels. Performed at [facility], [address] [place], [place] [address]   CBC every 6 hours x 4 post-procedure     Status: Abnormal   Collection Time: 01/21/19  1:31 AM  Result Value Ref Range   WBC 5.6 4.0 - 10.5 K/uL   RBC 3.49 (L) 4.22 - 5.81 MIL/uL   Hemoglobin 10.4 (L) 13.0 - 17.0 g/dL   HCT 31.9 (L) 39.0 - 52.0 %   MCV 91.4 80.0 - 100.0 fL   MCH 29.8 26.0 - 34.0 pg   MCHC 32.6 30.0 - 36.0 g/dL   RDW 15.3 11.5 - 15.5 %   Platelets 165 150 - 400 K/uL   nRBC 0.0 0.0 - 0.2 %    Comment: Performed at [facility], [address] [place], [place] [address]  Fibrinogen every 6 hours x 4 post-procedure     Status: None   Collection Time: 01/21/19  1:31 AM  Result Value Ref Range   Fibrinogen 243 210 - 475 mg/dL    Comment: Performed at [facility], [address] [place], [place] [address]  Basic metabolic panel     Status: Abnormal   Collection Time: 01/21/19  1:31 AM  Result Value Ref Range   Sodium 138 135 - 145 mmol/L   Potassium 5.1 3.5 - 5.1 mmol/L   Chloride 109 98 - 111 mmol/L   CO2 23 22 - 32 mmol/L   Glucose, Bld 112 (H) 70 - 99 mg/dL   BUN 24 (H) 8 - 23 mg/dL   Creatinine, Ser 1.88 (H) 0.61 -  1.24 mg/dL   Calcium 8.4 (L) 8.9 - 10.3 mg/dL   GFR calc non Af Amer 38 (L) >60 mL/min   GFR calc Af Amer 44 (L) >60 mL/min   Anion gap 6 5 - 15    Comment: Performed at [facility], [address] [place],  [address]  Magnesium     Status: None   Collection Time: 01/21/19  1:31 AM  Result Value Ref Range   Magnesium 2.0 1.7 - 2.4 mg/dL    Comment: Performed at [facility], [address] [place], [place] [address]  Heparin level (unfractionated) every  6 hours x 4 post-procedure     Status: Abnormal   Collection Time: 01/21/19 10:15 AM  Result Value Ref Range   Heparin Unfractionated 0.24 (L) 0.30 - 0.70 IU/mL    Comment: (NOTE) If heparin results are below expected values, and patient dosage has  been confirmed, suggest follow up testing of antithrombin III levels. Performed at [facility], [address] [place], [place] [address]   CBC every 6 hours x 4 post-procedure     Status: Abnormal   Collection Time: 01/21/19 10:15 AM  Result Value Ref Range   WBC 6.5 4.0 - 10.5 K/uL   RBC 3.03 (L) 4.22 - 5.81 MIL/uL   Hemoglobin 9.1 (L) 13.0 - 17.0 g/dL   HCT 28.0 (L) 39.0 - 52.0 %   MCV 92.4 80.0 - 100.0 fL   MCH 30.0 26.0 - 34.0 pg   MCHC 32.5 30.0 - 36.0 g/dL   RDW 15.5 11.5 - 15.5 %   Platelets 172 150 - 400 K/uL   nRBC 0.0 0.0 - 0.2 %    Comment: Performed at [facility], [address] [place], [place] [address]  Fibrinogen every 6 hours x 4 post-procedure     Status: None   Collection Time: 01/21/19 10:15 AM  Result Value Ref Range   Fibrinogen 245 210 - 475 mg/dL    Comment: Performed at [facility], [address] [place], [place] [address]  CBC     Status: Abnormal   Collection Time: 01/23/19  5:52 AM  Result Value Ref Range   WBC 6.5 4.0 - 10.5 K/uL   RBC 2.55 (L) 4.22 - 5.81 MIL/uL   Hemoglobin 7.7 (L) 13.0 - 17.0 g/dL   HCT 22.8 (L) 39.0 - 52.0 %   MCV 89.4 80.0 - 100.0 fL   MCH 30.2 26.0 - 34.0  pg   MCHC 33.8 30.0 - 36.0 g/dL   RDW 15.0 11.5 - 15.5 %   Platelets 156 150 - 400 K/uL   nRBC 0.0 0.0 - 0.2 %    Comment: Performed at [facility], [address] [place], [place] [address]  Basic metabolic panel     Status: Abnormal   Collection Time: 01/23/19  5:52 AM  Result Value Ref Range   Sodium 135 135 - 145 mmol/L   Potassium 4.3 3.5 - 5.1 mmol/L   Chloride 101 98 - 111 mmol/L   CO2 25 22 - 32 mmol/L   Glucose, Bld 101 (H) 70 - 99 mg/dL   BUN 14 8 - 23 mg/dL   Creatinine, Ser 1.43 (H) 0.61 - 1.24 mg/dL   Calcium 8.5 (L) 8.9 - 10.3 mg/dL   GFR calc non Af Amer 52 (L) >60 mL/min   GFR calc Af Amer >60 >60 mL/min   Anion gap 9 5 - 15    Comment: Performed at [facility], [address] [place], [place] [address]  Magnesium     Status: None   Collection Time: 01/23/19  5:52 AM  Result Value Ref Range   Magnesium 2.0 1.7 - 2.4 mg/dL    Comment: Performed at [facility], [address] [place], [place] [address]  SARS Coronavirus 2 [facility] order, Performed in [facility] hospital lab) Nasopharyngeal Nasopharyngeal Swab     Status: None   Collection Time: 01/28/19  1:12 PM   Specimen: Nasopharyngeal Swab  Result Value Ref Range   SARS Coronavirus 2 NEGATIVE NEGATIVE    Comment: (  NOTE) If result is NEGATIVE SARS-CoV-2 target nucleic acids are NOT DETECTED. The SARS-CoV-2 RNA is generally detectable in upper and lower  respiratory specimens during the acute phase of infection. The lowest  concentration of SARS-CoV-2 viral copies this assay can detect is 250  copies / mL. A negative result does not preclude SARS-CoV-2 infection  and should not be used as the sole basis for treatment or other  patient management decisions.  A negative result may occur with  improper specimen collection / handling, submission of specimen other  than nasopharyngeal swab, presence of viral mutation(s) within the  areas targeted by this assay, and inadequate  number of viral copies  (<250 copies / mL). A negative result must be combined with clinical  observations, patient history, and epidemiological information. If result is POSITIVE SARS-CoV-2 target nucleic acids are DETECTED. The SARS-CoV-2 RNA is generally detectable in upper and lower  respiratory specimens dur ing the acute phase of infection.  Positive  results are indicative of active infection with SARS-CoV-2.  Clinical  correlation with patient history and other diagnostic information is  necessary to determine patient infection status.  Positive results do  not rule out bacterial infection or co-infection with other viruses. If result is PRESUMPTIVE POSTIVE SARS-CoV-2 nucleic acids MAY BE PRESENT.   A presumptive positive result was obtained on the submitted specimen  and confirmed on repeat testing.  While 2019 novel coronavirus  (SARS-CoV-2) nucleic acids may be present in the submitted sample  additional confirmatory testing may be necessary for epidemiological  and / or clinical management purposes  to differentiate between  SARS-CoV-2 and other Sarbecovirus currently known to infect humans.  If clinically indicated additional testing with an alternate test  methodology [phone]) is advised. The SARS-CoV-2 RNA is generally  detectable in upper and lower respiratory sp ecimens during the acute  phase of infection. The expected result is Negative. Fact Sheet for Patients:  StrictlyIdeas.no Fact Sheet for Healthcare Providers: BankingDealers.co.za This test is not yet approved or cleared by the [place] FDA and has been authorized for detection and/or diagnosis of SARS-CoV-2 by FDA under an Emergency Use Authorization (EUA).  This EUA will remain in effect (meaning this test can be used) for the duration of the COVID-19 declaration under Section 564(b)(1) of the Act, 21 U.S.C. section 360bbb-3(b)(1), unless the  authorization is terminated or revoked sooner. Performed at [facility], [address] [place], [place] [address]   BUN     Status: None   Collection Time: 01/28/19  3:03 PM  Result Value Ref Range   BUN 22 8 - 23 mg/dL    Comment: Performed at [facility], [address] [place], [place] [address]  Creatinine, serum     Status: Abnormal   Collection Time: 01/28/19  3:03 PM  Result Value Ref Range   Creatinine, Ser 1.56 (H) 0.61 - 1.24 mg/dL   GFR calc non Af Amer 47 (L) >60 mL/min   GFR calc Af Amer 55 (L) >60 mL/min    Comment: Performed at [facility], [address] [place], [place] [address]  CBC     Status: Abnormal   Collection Time: 01/28/19  3:03 PM  Result Value Ref Range   WBC 7.9 4.0 - 10.5 K/uL   RBC 3.10 (L) 4.22 - 5.81 MIL/uL   Hemoglobin 9.4 (L) 13.0 - 17.0 g/dL   HCT 28.9 (L) 39.0 - 52.0 %   MCV 93.2 80.0 - 100.0 fL  MCH 30.3 26.0 - 34.0 pg   MCHC 32.5 30.0 - 36.0 g/dL   RDW 14.6 11.5 - 15.5 %   Platelets 488 (H) 150 - 400 K/uL   nRBC 0.0 0.0 - 0.2 %    Comment: Performed at [facility], [address] [place], [place] [address]  Glucose, capillary     Status: None   Collection Time: 01/28/19  5:14 PM  Result Value Ref Range   Glucose-Capillary 86 70 - 99 mg/dL  Heparin level (unfractionated) every 6 hours x 4 post-procedure     Status: Abnormal   Collection Time: 01/28/19  5:53 PM  Result Value Ref Range   Heparin Unfractionated 2.84 (H) 0.30 - 0.70 IU/mL    Comment: RESULTS CONFIRMED BY MANUAL DILUTION Performed at [facility], [address] [place], [place] [address]   CBC every 6 hours x 4 post-procedure     Status: Abnormal   Collection Time: 01/28/19  5:53 PM  Result Value Ref Range   WBC 9.3 4.0 - 10.5 K/uL   RBC 3.10 (L) 4.22 - 5.81 MIL/uL   Hemoglobin 9.2 (L) 13.0 - 17.0 g/dL   HCT 28.9 (L) 39.0 - 52.0 %   MCV 93.2 80.0 - 100.0 fL   MCH 29.7 26.0 - 34.0 pg   MCHC 31.8 30.0 -  36.0 g/dL   RDW 14.7 11.5 - 15.5 %   Platelets 465 (H) 150 - 400 K/uL   nRBC 0.0 0.0 - 0.2 %    Comment: Performed at [facility], [address] [place], [place] [address]  Fibrinogen every 6 hours x 4 post-procedure     Status: Abnormal   Collection Time: 01/28/19  5:53 PM  Result Value Ref Range   Fibrinogen 634 (H) 210 - 475 mg/dL    Comment: Performed at [facility], [address] [place], [place] [address]  Type and screen     Status: None   Collection Time: 01/28/19  5:53 PM  Result Value Ref Range   ABO/RH(D) A POS    Antibody Screen NEG    Sample Expiration      01/31/2019,2359 Performed at [facility], [address] [place], [place] [address]   Fibrinogen every 6 hours x 4 post-procedure     Status: None   Collection Time: 01/28/19 10:07 PM  Result Value Ref Range   Fibrinogen 317 210 - 475 mg/dL    Comment: Performed at [facility], [place], [place] [address]  Heparin level (unfractionated)     Status: Abnormal   Collection Time: 01/28/19 10:07 PM  Result Value Ref Range   Heparin Unfractionated 2.18 (H) 0.30 - 0.70 IU/mL    Comment: RESULTS CONFIRMED BY MANUAL DILUTION (NOTE) If heparin results are below expected values, and patient dosage has  been confirmed, suggest follow up testing of antithrombin III levels. Performed at [facility], [address] [place], [place] [address]   CBC     Status: Abnormal   Collection Time: 01/28/19 10:07 PM  Result Value Ref Range   WBC 9.9 4.0 - 10.5 K/uL   RBC 2.86 (L) 4.22 - 5.81 MIL/uL   Hemoglobin 8.6 (L) 13.0 - 17.0 g/dL   HCT 25.9 (L) 39.0 - 52.0 %   MCV 90.6 80.0 - 100.0 fL   MCH 30.1 26.0 - 34.0 pg   MCHC 33.2 30.0 - 36.0 g/dL   RDW 14.6 11.5 - 15.5 %  Platelets 374 150 - 400 K/uL   nRBC 0.0 0.0 - 0.2 %    Comment: Performed at [facility], [address] [place], [place] [address]  APTT     Status: Abnormal   Collection  Time: 01/28/19 10:07 PM  Result Value Ref Range   aPTT 54 (H) 24 - 36 seconds    Comment:        IF BASELINE aPTT IS ELEVATED, SUGGEST PATIENT RISK ASSESSMENT BE USED TO DETERMINE APPROPRIATE ANTICOAGULANT THERAPY. Performed at [facility], [place], [place] [address]   Heparin level (unfractionated) every 6 hours x 4 post-procedure     Status: Abnormal   Collection Time: 01/29/19  3:10 AM  Result Value Ref Range   Heparin Unfractionated 1.54 (H) 0.30 - 0.70 IU/mL    Comment: (NOTE) If heparin results are below expected values, and patient dosage has  been confirmed, suggest follow up testing of antithrombin III levels. Performed at [facility], [address] [place], [place] [address]   CBC every 6 hours x 4 post-procedure     Status: Abnormal   Collection Time: 01/29/19  3:10 AM  Result Value Ref Range   WBC 9.6 4.0 - 10.5 K/uL   RBC 2.95 (L) 4.22 - 5.81 MIL/uL   Hemoglobin 8.9 (L) 13.0 - 17.0 g/dL   HCT 26.5 (L) 39.0 - 52.0 %   MCV 89.8 80.0 - 100.0 fL   MCH 30.2 26.0 - 34.0 pg   MCHC 33.6 30.0 - 36.0 g/dL   RDW 14.6 11.5 - 15.5 %   Platelets 359 150 - 400 K/uL   nRBC 0.0 0.0 - 0.2 %    Comment: Performed at [facility], [address] [place], [place] [address]  Fibrinogen every 6 hours x 4 post-procedure     Status: Abnormal   Collection Time: 01/29/19  3:10 AM  Result Value Ref Range   Fibrinogen 161 (L) 210 - 475 mg/dL    Comment: Performed at [facility], [address] [place], [place] [address]  Basic metabolic panel     Status: Abnormal   Collection Time: 01/29/19  3:10 AM  Result Value Ref Range   Sodium 135 135 - 145 mmol/L   Potassium 5.1 3.5 - 5.1 mmol/L   Chloride 104 98 - 111 mmol/L   CO2 21 (L) 22 - 32 mmol/L   Glucose, Bld 129 (H) 70 - 99 mg/dL   BUN 25 (H) 8 - 23 mg/dL   Creatinine, Ser 1.76 (H) 0.61 - 1.24 mg/dL   Calcium 8.7 (L) 8.9 - 10.3 mg/dL   GFR calc non Af Amer 41 (L) >60  mL/min   GFR calc Af Amer 47 (L) >60 mL/min   Anion gap 10 5 - 15    Comment: Performed at [facility], [address] [place], [place] [address]  Magnesium     Status: None   Collection Time: 01/29/19  3:10 AM  Result Value Ref Range   Magnesium 1.9 1.7 - 2.4 mg/dL    Comment: Performed at [facility], [place], [place] [address]  Heparin level (unfractionated) every 6 hours x 4 post-procedure     Status: Abnormal   Collection Time: 01/29/19  7:40 AM  Result Value Ref Range   Heparin Unfractionated 1.35 (H) 0.30 - 0.70 IU/mL    Comment: (NOTE) If heparin results are below expected values, and patient dosage has  been confirmed,  suggest follow up testing of antithrombin III levels. Performed at [facility], [address] [place], [place] [address]   CBC every 6 hours x 4 post-procedure     Status: Abnormal   Collection Time: 01/29/19  7:40 AM  Result Value Ref Range   WBC 8.2 4.0 - 10.5 K/uL   RBC 2.78 (L) 4.22 - 5.81 MIL/uL   Hemoglobin 8.3 (L) 13.0 - 17.0 g/dL   HCT 26.0 (L) 39.0 - 52.0 %   MCV 93.5 80.0 - 100.0 fL   MCH 29.9 26.0 - 34.0 pg   MCHC 31.9 30.0 - 36.0 g/dL   RDW 14.6 11.5 - 15.5 %   Platelets 335 150 - 400 K/uL   nRBC 0.0 0.0 - 0.2 %    Comment: Performed at [facility], [address] [place], [place] [address]  Fibrinogen every 6 hours x 4 post-procedure     Status: Abnormal   Collection Time: 01/29/19  7:40 AM  Result Value Ref Range   Fibrinogen 146 (L) 210 - 475 mg/dL    Comment: Performed at [facility], [address] [place], [place] [address]  APTT     Status: Abnormal   Collection Time: 01/29/19  7:40 AM  Result Value Ref Range   aPTT 75 (H) 24 - 36 seconds    Comment:        IF BASELINE aPTT IS ELEVATED, SUGGEST PATIENT RISK ASSESSMENT BE USED TO DETERMINE APPROPRIATE ANTICOAGULANT THERAPY. Performed at [facility], [address] [place], [place] [address]    APTT     Status: Abnormal   Collection Time: 01/29/19  3:04 PM  Result Value Ref Range   aPTT 125 (H) 24 - 36 seconds    Comment:        IF BASELINE aPTT IS ELEVATED, SUGGEST PATIENT RISK ASSESSMENT BE USED TO DETERMINE APPROPRIATE ANTICOAGULANT THERAPY. Performed at [facility], [address] [place], [place] [address]   CBC     Status: Abnormal   Collection Time: 01/30/19  3:50 AM  Result Value Ref Range   WBC 8.0 4.0 - 10.5 K/uL   RBC 2.41 (L) 4.22 - 5.81 MIL/uL   Hemoglobin 7.1 (L) 13.0 - 17.0 g/dL   HCT 21.9 (L) 39.0 - 52.0 %   MCV 90.9 80.0 - 100.0 fL   MCH 29.5 26.0 - 34.0 pg   MCHC 32.4 30.0 - 36.0 g/dL   RDW 14.6 11.5 - 15.5 %   Platelets 295 150 - 400 K/uL   nRBC 0.0 0.0 - 0.2 %    Comment: Performed at [facility], [address] [place], [place] [address]  Basic metabolic panel     Status: Abnormal   Collection Time: 01/30/19  3:50 AM  Result Value Ref Range   Sodium 136 135 - 145 mmol/L   Potassium 4.8 3.5 - 5.1 mmol/L   Chloride 106 98 - 111 mmol/L   CO2 23 22 - 32 mmol/L   Glucose, Bld 113 (H) 70 - 99 mg/dL   BUN 24 (H) 8 - 23 mg/dL   Creatinine, Ser 1.71 (H) 0.61 - 1.24 mg/dL   Calcium 8.5 (L) 8.9 - 10.3 mg/dL   GFR calc non Af Amer 42 (L) >60 mL/min   GFR calc Af Amer 49 (L) >60 mL/min   Anion gap 7 5 - 15    Comment: Performed at [facility], [address], [place],  [address]  Magnesium  Status: None   Collection Time: 01/30/19  3:50 AM  Result Value Ref Range   Magnesium 2.1 1.7 - 2.4 mg/dL    Comment: Performed at [facility], [address] [place], [place] [address]  Hemoglobin     Status: Abnormal   Collection Time: 01/30/19  1:53 PM  Result Value Ref Range   Hemoglobin 7.9 (L) 13.0 - 17.0 g/dL    Comment: Performed at [facility], [address] [place], [place] [address]      PHQ2/9: Depression screen [facility] 2/9 02/10/2019 11/20/2018 10/21/2018 06/22/2018 06/08/2018   Decreased Interest 0 0 0 0 0  Down, Depressed, Hopeless 0 0 0 0 0  PHQ - 2 Score 0 0 0 0 0  Altered sleeping 0 0 0 1 1  Tired, decreased energy 0 0 0 0 0  Change in appetite 0 0 0 0 0  Feeling bad or failure about yourself  0 0 0 0 0  Trouble concentrating 0 0 0 0 0  Moving slowly or fidgety/restless 0 0 0 0 0  Suicidal thoughts 0 0 0 0 0  PHQ-9 Score 0 0 0 1 1  Difficult doing work/chores Not difficult at all - - Not difficult at all Not difficult at all    phq 9 is negative   Fall Risk: Fall Risk  02/10/2019 11/20/2018 10/21/2018 07/06/2018 06/22/2018  Falls in the past year? 0 0 0 0 0  Number falls in past yr: 0 0 0 0 0  Injury with Fall? 0 0 0 0 0     Functional Status Survey: Is the patient deaf or have difficulty hearing?: No Does the patient have difficulty seeing, even when wearing glasses/contacts?: No Does the patient have difficulty concentrating, remembering, or making decisions?: No Does the patient have difficulty walking or climbing stairs?: No Does the patient have difficulty dressing or bathing?: No Does the patient have difficulty doing errands alone such as visiting a doctor's office or shopping?: No   Assessment & Plan  1. Hypertension, benign  - amLODipine (NORVASC) 2.5 MG tablet; Take 1 tablet (2.5 mg total) by mouth daily.  Dispense: 90 tablet; Refill: 0 - lisinopril (ZESTRIL) 20 MG tablet; Take 1 tablet (20 mg total) by mouth daily.  Dispense: 90 tablet; Refill: 0 - Referral to Chronic Care Management Services  2. Atherosclerosis of aorta (HCC)  - atorvastatin (LIPITOR) 20 MG tablet; Take 1 tablet (20 mg total) by mouth daily.  Dispense: 90 tablet; Refill: 1 - Referral to Chronic Care Management Services  3. PAD (peripheral artery disease) (HCC)  - atorvastatin (LIPITOR) 20 MG tablet; Take 1 tablet (20 mg total) by mouth daily.  Dispense: 90 tablet; Refill: 1 - Referral to Chronic Care Management Services  4. Lower extremity pain, anterior,  left  Discussed rolling , tylenol, avoid nsaid's because takes Eliquis, use topical diclofenac and stretch at home - acetaminophen (TYLENOL) 500 MG tablet; Take 1 tablet (500 mg total) by mouth 2 (two) times daily.  Dispense: 100 tablet; Refill: 0 - diclofenac sodium (VOLTAREN) 1 % GEL; Apply 4 g topically 4 (four) times daily.  Dispense: 100 g; Refill: 0

## 2019-02-12 ENCOUNTER — Other Ambulatory Visit: Payer: Self-pay | Admitting: Family Medicine

## 2019-02-15 ENCOUNTER — Encounter (INDEPENDENT_AMBULATORY_CARE_PROVIDER_SITE_OTHER): Payer: Self-pay | Admitting: Nurse Practitioner

## 2019-02-15 ENCOUNTER — Ambulatory Visit (INDEPENDENT_AMBULATORY_CARE_PROVIDER_SITE_OTHER): Payer: BC Managed Care – PPO

## 2019-02-15 ENCOUNTER — Other Ambulatory Visit: Payer: Self-pay

## 2019-02-15 ENCOUNTER — Other Ambulatory Visit (INDEPENDENT_AMBULATORY_CARE_PROVIDER_SITE_OTHER): Payer: Self-pay | Admitting: Vascular Surgery

## 2019-02-15 ENCOUNTER — Ambulatory Visit (INDEPENDENT_AMBULATORY_CARE_PROVIDER_SITE_OTHER): Payer: BC Managed Care – PPO | Admitting: Nurse Practitioner

## 2019-02-15 VITALS — BP 152/83 | HR 88 | Resp 16 | Ht 68.0 in | Wt 134.0 lb

## 2019-02-15 DIAGNOSIS — Z9862 Peripheral vascular angioplasty status: Secondary | ICD-10-CM

## 2019-02-15 DIAGNOSIS — J438 Other emphysema: Secondary | ICD-10-CM

## 2019-02-15 DIAGNOSIS — I739 Peripheral vascular disease, unspecified: Secondary | ICD-10-CM

## 2019-02-15 DIAGNOSIS — I10 Essential (primary) hypertension: Secondary | ICD-10-CM | POA: Diagnosis not present

## 2019-02-15 NOTE — Discharge Summary (Deleted)
  The note originally documented on this encounter has been moved the the encounter in which it belongs.  

## 2019-02-15 NOTE — Progress Notes (Signed)
SUBJECTIVE:  Patient ID: Ryan [name], male    DOB: July 26, 1955, 63 y.o.   MRN: [id number] Chief Complaint  Patient presents with  . Follow-up    [facility] 3 week ABI    HPI  Ryan [name] is a 63 y.o. male The patient returns to the office for followup and review of the noninvasive studies. There have been no interval changes in lower extremity symptoms. No interval shortening of the patient's claudication distance or development of rest pain symptoms. No new ulcers or wounds have occurred since the last visit.  Patient has a previous history of multiple interventions on his right lower extremity.  The patient has had several ischemic events of the right lower extremity.  There have been no significant changes to the patient's overall health care.  The patient denies amaurosis fugax or recent TIA symptoms. There are no recent neurological changes noted. The patient denies history of DVT, PE or superficial thrombophlebitis. The patient denies recent episodes of angina or shortness of breath.   The patient also complains of pain in the left thigh area.  He states is more of a soreness and an aching, nagging pain.  He states that originally it started when he had pain in his back and it shot towards his left leg and since then his back has been hurting.  He states that the pain in his leg worsens with certain positions such as laying down.  ABI Rt=1.13  and Lt=1.08  (previous ABI's Rt=0.06 and Lt=1.24) Duplex ultrasound of the right tibial arteries reveals biphasic waveforms with triphasic left tibial artery waveforms.  Past Medical History:  Diagnosis Date  . Hyperlipidemia   . Hypertension   . Neuromuscular disorder (HCC)    numbness feet  . Peripheral vascular disease ([place])   . Shortness of breath dyspnea     Past Surgical History:  Procedure Laterality Date  . COLONOSCOPY WITH PROPOFOL N/A 02/15/2016   Procedure: COLONOSCOPY WITH PROPOFOL;  Surgeon: Lucilla Lame, MD;   Location: [place];  Service: Endoscopy;  Laterality: N/A;  . HERNIA REPAIR  1999   left inguinal  . LOWER EXTREMITY ANGIOGRAPHY Right 05/07/2018   Procedure: LOWER EXTREMITY ANGIOGRAPHY;  Surgeon: Algernon Huxley, MD;  Location: [place] [facility];  Service: Cardiovascular;  Laterality: Right;  . LOWER EXTREMITY ANGIOGRAPHY Right 06/25/2018   Procedure: LOWER EXTREMITY ANGIOGRAPHY;  Surgeon: Algernon Huxley, MD;  Location: [place] [facility];  Service: Cardiovascular;  Laterality: Right;  . LOWER EXTREMITY ANGIOGRAPHY Right 07/01/2018   Procedure: LOWER EXTREMITY ANGIOGRAPHY;  Surgeon: Algernon Huxley, MD;  Location: [place] [facility];  Service: Cardiovascular;  Laterality: Right;  . LOWER EXTREMITY ANGIOGRAPHY Right 08/12/2018   Procedure: LOWER EXTREMITY ANGIOGRAPHY;  Surgeon: Algernon Huxley, MD;  Location: [place] [facility];  Service: Cardiovascular;  Laterality: Right;  . LOWER EXTREMITY ANGIOGRAPHY Right 09/03/2018   Procedure: LOWER EXTREMITY ANGIOGRAPHY;  Surgeon: Algernon Huxley, MD;  Location: [place] [facility];  Service: Cardiovascular;  Laterality: Right;  . LOWER EXTREMITY ANGIOGRAPHY Right 09/04/2018   Procedure: Lower Extremity Angiography;  Surgeon: Algernon Huxley, MD;  Location: [place] [facility];  Service: Cardiovascular;  Laterality: Right;  . LOWER EXTREMITY ANGIOGRAPHY Right 10/01/2018   Procedure: LOWER EXTREMITY ANGIOGRAPHY;  Surgeon: Algernon Huxley, MD;  Location: [place] [facility];  Service: Cardiovascular;  Laterality: Right;  . LOWER EXTREMITY ANGIOGRAPHY Right 10/01/2018   Procedure: Lower Extremity Angiography;  Surgeon: Algernon Huxley,  MD;  Location: [place] [facility];  Service: Cardiovascular;  Laterality: Right;  . LOWER EXTREMITY ANGIOGRAPHY Right 10/15/2018   Procedure: LOWER EXTREMITY ANGIOGRAPHY;  Surgeon: Algernon Huxley, MD;  Location: [place] [facility];  Service: Cardiovascular;  Laterality: Right;  . LOWER EXTREMITY ANGIOGRAPHY Left 10/16/2018    Procedure: Lower Extremity Angiography;  Surgeon: Algernon Huxley, MD;  Location: [place] [facility];  Service: Cardiovascular;  Laterality: Left;  . LOWER EXTREMITY ANGIOGRAPHY Left 01/20/2019   Procedure: LOWER EXTREMITY ANGIOGRAPHY;  Surgeon: Algernon Huxley, MD;  Location: [place] [facility];  Service: Cardiovascular;  Laterality: Left;  . LOWER EXTREMITY ANGIOGRAPHY Right 01/21/2019   Procedure: Lower Extremity Angiography;  Surgeon: Algernon Huxley, MD;  Location: [place] [facility];  Service: Cardiovascular;  Laterality: Right;  . LOWER EXTREMITY ANGIOGRAPHY Right 01/28/2019   Procedure: LOWER EXTREMITY ANGIOGRAPHY;  Surgeon: Algernon Huxley, MD;  Location: [place] [facility];  Service: Cardiovascular;  Laterality: Right;  . LOWER EXTREMITY ANGIOGRAPHY Right 01/29/2019   Procedure: Lower Extremity Angiography;  Surgeon: Algernon Huxley, MD;  Location: [place] [facility];  Service: Cardiovascular;  Laterality: Right;  . LOWER EXTREMITY INTERVENTION Right 07/02/2018   Procedure: LOWER EXTREMITY INTERVENTION;  Surgeon: Algernon Huxley, MD;  Location: [place] [facility];  Service: Cardiovascular;  Laterality: Right;  . POLYPECTOMY N/A 02/15/2016   Procedure: POLYPECTOMY;  Surgeon: Lucilla Lame, MD;  Location: [place];  Service: Endoscopy;  Laterality: N/A;    Social History   Socioeconomic History  . Marital status: Legally Separated    Spouse name: [name]  . Number of children: 5  . Years of education: Not on file  . Highest education level: High school graduate  Occupational History  . Occupation: [employer]    Comment: [name]  Social Needs  . Financial resource strain: Not hard at all  . Food insecurity    Worry: Never true    Inability: Never true  . Transportation needs    Medical: No    Non-medical: No  Tobacco Use  . Smoking status: Former Smoker    Packs/day: 0.10    Years: 40.00    Pack years: 4.00    Types: Cigarettes    Start date: 07/21/1978    Quit date:  08/28/2018    Years since quitting: 0.4  . Smokeless tobacco: Never Used  Substance and Sexual Activity  . Alcohol use: Yes    Alcohol/week: 12.0 standard drinks    Types: 12 Cans of beer per week  . Drug use: Never  . Sexual activity: Yes    Partners: Female    Birth control/protection: None  Lifestyle  . Physical activity    Days per week: 6 days    Minutes per session: 60 min  . Stress: Not at all  Relationships  . Social Herbalist on phone: Three times a week    Gets together: Twice a week    Attends religious service: Never    Active member of club or organization: No    Attends meetings of clubs or organizations: Never    Relationship status: Married  . Intimate partner violence    Fear of current or ex partner: No    Emotionally abused: No    Physically abused: No    Forced sexual activity: No  Other Topics Concern  . Not on file  Social History Narrative  . Not on file    Family History  Problem Relation Age of Onset  . COPD Mother   . Hypertension Father   . Heart attack Brother     No Known Allergies   Review of Systems   Review of Systems: Negative Unless Checked Constitutional: [] Weight loss  [] Fever  [] Chills Cardiac: [] Chest pain   []  Atrial Fibrillation  [] Palpitations   [] Shortness of breath when laying flat   [] Shortness of breath with exertion. [] Shortness of breath at rest Vascular:  [] Pain in legs with walking   [] Pain in legs with standing [] Pain in legs when laying flat   [] Claudication    [] Pain in feet when laying flat    [] History of DVT   [] Phlebitis   [] Swelling in legs   [x] Varicose veins   [] Non-healing ulcers Pulmonary:   [] Uses home oxygen   [] Productive cough   [] Hemoptysis   [] Wheeze  [] COPD   [] Asthma Neurologic:  [] Dizziness   [] Seizures  [] Blackouts [] History of stroke   [] History of TIA  [] Aphasia   [] Temporary Blindness   [] Weakness or numbness in arm   [x] Weakness or numbness in leg Musculoskeletal:   [] Joint  swelling   [] Joint pain   [x] Low back pain  []  History of Knee Replacement [] Arthritis [] back Surgeries  []  Spinal Stenosis    Hematologic:  [] Easy bruising  [] Easy bleeding   [] Hypercoagulable state   [] Anemic Gastrointestinal:  [] Diarrhea   [] Vomiting  [] Gastroesophageal reflux/heartburn   [] Difficulty swallowing. [] Abdominal pain Genitourinary:  [] Chronic kidney disease   [] Difficult urination  [] Anuric   [] Blood in urine [] Frequent urination  [] Burning with urination   [] Hematuria Skin:  [] Rashes   [] Ulcers [] Wounds Psychological:  [] History of anxiety   []  History of major depression  []  Memory Difficulties      OBJECTIVE:   Physical Exam  BP (!) 152/83 (BP Location: Right Arm)   Pulse 88   Resp 16   Ht 5\' 8"  (1.727 m)   Wt 134 lb (60.8 kg)   BMI 20.37 kg/m   Gen: WD/WN, NAD Head: [place]/AT, No temporalis wasting.  Ear/Nose/Throat: Hearing grossly intact, nares w/o erythema or drainage Eyes: PER, EOMI, sclera nonicteric.  Neck: Supple, no masses.  No JVD.  Pulmonary:  Good air movement, no use of accessory muscles.  Cardiac: RRR Vascular:  Vessel Right Left  Radial Palpable Palpable  Dorsalis Pedis Palpable Palpable  Posterior Tibial Palpable Palpable   Gastrointestinal: soft, non-distended. No guarding/no peritoneal signs.  Musculoskeletal: M/S 5/5 throughout.  No deformity or atrophy.  Neurologic: Pain and light touch intact in extremities.  Symmetrical.  Speech is fluent. Motor exam as listed above. Psychiatric: Judgment intact, Mood & affect appropriate for pt's clinical situation. Dermatologic: No Venous rashes. No Ulcers Noted.  No changes consistent with cellulitis. Lymph : No Cervical lymphadenopathy, no lichenification or skin changes of chronic lymphedema.       ASSESSMENT AND PLAN:  1. PAD (peripheral artery disease) (HCC) Recommend:  The patient is status post successful angiogram with intervention.  The patient reports that the claudication symptoms and  leg pain is essentially gone.   The patient denies lifestyle limiting changes at this point in time.  No further invasive studies, angiography or surgery at this time The patient should continue walking and begin a more formal exercise program.  The patient should continue antiplatelet therapy and aggressive treatment of the lipid abnormalities  The patient should continue wearing graduated compression socks 10-15 mmHg strength to control the mild edema.  The patient within the patient's left lower  extremity seems to be much more related to back pain and the patient has been advised to follow with his primary care for further work-up.  Previously, the patient had requested to establish care with the [facility] vascular service.  However due to the patient's recurring vascular issues and ischemic leg at the time it was felt that it was best to continue with his care here.  At this time the patient is stable, he has no ischemic concerns therefore it is felt that this would be the best opportunity to transition care to the [facility] vascular office.  - Ambulatory referral to Vascular Surgery  2. Hypertension, benign Continue antihypertensive medications as already ordered, these medications have been reviewed and there are no changes at this time.   3. Other emphysema ([place]) Continue pulmonary medications and aerosols as already ordered, these medications have been reviewed and there are no changes at this time.     Current Outpatient Medications on File Prior to Visit  Medication Sig Dispense Refill  . acetaminophen (TYLENOL) 500 MG tablet Take 1 tablet (500 mg total) by mouth 2 (two) times daily. 100 tablet 0  . amLODipine (NORVASC) 2.5 MG tablet Take 1 tablet (2.5 mg total) by mouth daily. 90 tablet 0  . apixaban (ELIQUIS) 5 MG TABS tablet Take 1 tablet (5 mg total) by mouth 2 (two) times daily. 180 tablet 3  . aspirin EC 81 MG tablet Take 1 tablet (81 mg total) by mouth daily. 90 tablet  3  . atorvastatin (LIPITOR) 20 MG tablet Take 1 tablet (20 mg total) by mouth daily. 90 tablet 1  . Cyanocobalamin (VITAMIN B-12) 1000 MCG SUBL Place 1 tablet (1,000 mcg total) under the tongue daily. 90 tablet 0  . diclofenac sodium (VOLTAREN) 1 % GEL Apply 4 g topically 4 (four) times daily. 100 g 0  . Fluticasone-Umeclidin-Vilant (TRELEGY ELLIPTA) 100-62.5-25 MCG/INH AEPB Inhale 1 puff into the lungs daily. 60 each 2  . ibuprofen (ADVIL) 200 MG tablet Take 200 mg by mouth every 4 (four) hours as needed.    Marland Kitchen lisinopril (ZESTRIL) 20 MG tablet Take 1 tablet (20 mg total) by mouth daily. 90 tablet 0  . thiamine (VITAMIN B-1) 50 MG tablet Take 1 tablet (50 mg total) by mouth daily. 30 tablet 2   No current facility-administered medications on file prior to visit.     There are no Patient Instructions on file for this visit. No follow-ups on file.   Kris Hartmann, NP  This note was completed with Sales executive.  Any errors are purely unintentional.

## 2019-02-15 NOTE — Discharge Summary (Addendum)
[facility]    Discharge Summary  Patient ID:  Ryan [name] MRN: [id number] DOB/AGE: 62-Dec-1957 62 y.o.  Admit date: 01/20/2019 Discharge date: 01/23/2019 Surgeon: Surgeon(s): Algernon Huxley, MD  Admission Diagnosis: RT leg angio w intervention  Ischemic leg  Discharge Diagnoses:  RT leg angio w intervention  Ischemic leg  Secondary Diagnoses: Past Medical History:  Diagnosis Date  . Hyperlipidemia   . Hypertension   . Neuromuscular disorder (HCC)    numbness feet  . Peripheral vascular disease ([place])   . Shortness of breath dyspnea    Procedure(s): Lower Extremity Angiography  Discharged Condition: Good  HPI / Hospital Course:  Ryan [name] is a 63 y.o. male who presented to our clinic following an angiogram done on 01/21/2019.  The patient had a thrombectomy of the right CFA, SFA and anterior tibial artery.  The patient stated that he started having pain initially about a day or 2 after the procedure.  He stated that the pain has steadily progressed since that point to where now he feels as if he can barely walk.  He stated that the pain is constant and he is unable to sleep because when he lays in bed the pain is excruciating. The patient underwent noninvasive studies which indicate an ABI of 0.06 on his right was an absent TBI.  His left has an ABI of 1.24 with a TBI 0.85.  Previously on 01/18/2019 the ABI was 0.31 and the left lower extremity was 1.13.  Limited duplex shows the mid ATA has low monophasic flow and that the PTA is occluded.  The left lower extremity has triphasic waveforms in the tibial arteries.  The patient underwent: 1. Ultrasound guidance for vascular access left femoral artery 2. Catheter placement into right popliteal artery from left femoral approach 3. Selective right lower extremity angiogram 4.  Catheter directed thrombolytic therapy with 8 mg of TPA to the right SFA,  popliteal artery, and tibioperoneal trunk with placement of infusion catheter for continued thrombolytic therapy overnight  He tolerated procedure well and was transferred from the endovascular suite to the ICU for a TPA/heparin infusion overnight.  The patient's night of procedure was unremarkable.  The following morning the patient was taken back to the endovascular suite and underwent:  1.  Right lower extremity angiogram 2. Catheter placement into right tibioperoneal trunk from left femoral approach 3.  Mechanical thrombectomy to the right popliteal artery with the penumbra cat 6 device 4. Percutaneous transluminal angioplasty of right tibioperoneal trunk and distal popliteal artery with 4 mm diameter by 8 cm length Lutonix drug-coated angioplasty balloon 5. StarClose closure device left femoral artery  Again, the patient tolerated procedure well was transferred back to the ICU for Aggrastat infusion x1 day.   The day of discharge the patient was tolerating a regular diet, he was urinating independently, his discomfort was controlled with the use of p.o. pain medication and he was ambulating at his baseline.  Upon discharge, he was afebrile with vital stable signs.  The patient's right lower extremity was warm distally to the toes.  Physical exam:  Blood pressure 108/60, pulse (!) 105, temperature 98 F (36.7 C), resp. rate 18, height 5\' 8"  (1.727 m), weight 59.6 kg, SpO2 100 %. General appearance: alert, cooperative and appears stated age Head: Normocephalic, without obvious abnormality, atraumatic Eyes: conjunctivae/corneas clear. PERRL, EOM's intact. Fundi benign. Neck: no adenopathy, no carotid bruit, no JVD, supple, symmetrical, trachea midline and thyroid not enlarged, symmetric,  no tenderness/mass/nodules Back: symmetric, no curvature. ROM normal. No CVA tenderness. Extremities: extremities normal, atraumatic, no  cyanosis or edema and Doppler signals are strong bilaterally No wound in the right or left foot  Labs as below  Complications: None  Consults: None  Significant Diagnostic Studies: CBC Lab Results  Component Value Date   WBC 8.0 01/30/2019   HGB 7.9 (L) 01/30/2019   HCT 21.9 (L) 01/30/2019   MCV 90.9 01/30/2019   PLT 295 01/30/2019   BMET    Component Value Date/Time   NA 136 01/30/2019 0350   NA 137 08/31/2015 0929   K 4.8 01/30/2019 0350   CL 106 01/30/2019 0350   CO2 23 01/30/2019 0350   GLUCOSE 113 (H) 01/30/2019 0350   BUN 24 (H) 01/30/2019 0350   BUN 11 08/31/2015 0929   CREATININE 1.71 (H) 01/30/2019 0350   CREATININE 1.67 (H) 11/20/2018 1601   CALCIUM 8.5 (L) 01/30/2019 0350   GFRNONAA 42 (L) 01/30/2019 0350   GFRNONAA 44 (L) 11/20/2018 1601   GFRAA 49 (L) 01/30/2019 0350   GFRAA 50 (L) 11/20/2018 1601   COAG Lab Results  Component Value Date   INR 1.1 01/20/2019   INR 1.2 10/17/2018   Disposition:  Discharge to :Home  Allergies as of 01/30/2019   No Known Allergies     Medication List    TAKE these medications   apixaban 5 MG Tabs tablet Commonly known as: ELIQUIS Take 1 tablet (5 mg total) by mouth 2 (two) times daily.   aspirin EC 81 MG tablet Take 1 tablet (81 mg total) by mouth daily.   Fluticasone-Umeclidin-Vilant 100-62.5-25 MCG/INH Aepb Commonly known as: Trelegy Ellipta Inhale 1 puff into the lungs daily.   ibuprofen 200 MG tablet Commonly known as: ADVIL Take 200 mg by mouth every 4 (four) hours as needed.   thiamine 50 MG tablet Commonly known as: VITAMIN B-1 Take 1 tablet (50 mg total) by mouth daily.   Vitamin B-12 1000 MCG Subl Place 1 tablet (1,000 mcg total) under the tongue daily.      Verbal and written Discharge instructions given to the patient. Wound care per Discharge AVS  Signed: Sela Hua, PA-C  02/15/2019, 3:39 PM

## 2019-02-19 ENCOUNTER — Ambulatory Visit: Payer: Self-pay | Admitting: Pharmacist

## 2019-02-19 NOTE — Chronic Care Management (AMB) (Signed)
  Care Management   Note  02/19/2019 Name: Ryan [name] MRN: [id number] DOB: December 25, 1955  63 y.o. year old male referred to Chronic Care Management by Dr. Steele Sizer for medication adherence. Last office visit with Steele Sizer, MD was 02/10/19.   Was unable to reach patient via telephone today and have left HIPAA compliant voicemail asking patient to return my call. (unsuccessful outreach #1).  [name], PharmD Clinical Pharmacist [facility] [phone]

## 2019-02-25 ENCOUNTER — Telehealth: Payer: Self-pay | Admitting: *Deleted

## 2019-02-25 DIAGNOSIS — Z87891 Personal history of nicotine dependence: Secondary | ICD-10-CM

## 2019-02-25 DIAGNOSIS — Z122 Encounter for screening for malignant neoplasm of respiratory organs: Secondary | ICD-10-CM

## 2019-02-25 NOTE — Telephone Encounter (Signed)
Former, quit 08/23/18 33.75 pack year

## 2019-02-25 NOTE — Telephone Encounter (Signed)
Patient has been notified that lung cancer screening CT scan is due currently or will be in near future. Confirmed that patient is within the appropriate age range, and asymptomatic, (no signs or symptoms of lung cancer). Patient denies illness that would prevent curative treatment for lung cancer if found. Verified smoking history (Former smoker quit approximately 5 to 6 months ago). Patient is agreeable for CT scan being scheduled and prefers late afternoon appointments.

## 2019-02-25 NOTE — Addendum Note (Signed)
Addended by: Lieutenant Diego on: 02/25/2019 04:26 PM   Modules accepted: Orders

## 2019-03-04 ENCOUNTER — Other Ambulatory Visit: Payer: Self-pay

## 2019-03-04 ENCOUNTER — Ambulatory Visit
Admission: RE | Admit: 2019-03-04 | Discharge: 2019-03-04 | Disposition: A | Discharge status: Home / Self Care | Payer: BC Managed Care – PPO | Source: Ambulatory Visit | Attending: Oncology | Admitting: Oncology

## 2019-03-04 DIAGNOSIS — Z87891 Personal history of nicotine dependence: Secondary | ICD-10-CM | POA: Diagnosis not present

## 2019-03-04 DIAGNOSIS — Z122 Encounter for screening for malignant neoplasm of respiratory organs: Secondary | ICD-10-CM | POA: Diagnosis not present

## 2019-03-05 ENCOUNTER — Ambulatory Visit: Payer: Self-pay | Admitting: Pharmacist

## 2019-03-05 NOTE — Chronic Care Management (AMB) (Signed)
  Care Management   Note  03/05/2019 Name: Ryan [name] MRN: [id number] DOB: Jan 05, 1956  63 y.o. year old male referred to Chronic Care Management by Dr. Steele Sizer for medication adherence. Last office visit with Steele Sizer, MD was 02/10/19.   Was unable to reach patient via telephone today and have left HIPAA compliant voicemail asking patient to return my call. (unsuccessful outreach #2).  [name], PharmD Clinical Pharmacist [facility] [phone]

## 2019-03-09 ENCOUNTER — Encounter: Payer: Self-pay | Admitting: *Deleted

## 2019-03-11 ENCOUNTER — Encounter: Payer: Self-pay | Admitting: Vascular Surgery

## 2019-04-02 ENCOUNTER — Ambulatory Visit: Payer: Self-pay | Admitting: Pharmacist

## 2019-04-02 NOTE — Chronic Care Management (AMB) (Signed)
  Chronic Care Management   Note  04/02/2019 Name: Ryan [name] MRN: [id number] DOB: 09-03-1955  63 y.o. year old male referred to Chronic Care Management team for medication adherence by Dr. Steele Sizer   CCM team services are being closed due to three unsuccessful outreach attempts.   Patient has been provided CCM contact information if he/she wishes to engage with care managers in the future.    [name], PharmD Clinical Pharmacist [facility] [phone]

## 2019-04-07 ENCOUNTER — Emergency Department: Payer: BC Managed Care – PPO

## 2019-04-07 ENCOUNTER — Encounter: Payer: Self-pay | Admitting: Emergency Medicine

## 2019-04-07 ENCOUNTER — Emergency Department
Admission: EM | Admit: 2019-04-07 | Discharge: 2019-04-07 | Disposition: A | Discharge status: Home / Self Care | Payer: BC Managed Care – PPO | Attending: Emergency Medicine | Admitting: Emergency Medicine

## 2019-04-07 ENCOUNTER — Other Ambulatory Visit: Payer: Self-pay

## 2019-04-07 DIAGNOSIS — Z7901 Long term (current) use of anticoagulants: Secondary | ICD-10-CM | POA: Insufficient documentation

## 2019-04-07 DIAGNOSIS — M79605 Pain in left leg: Secondary | ICD-10-CM | POA: Diagnosis not present

## 2019-04-07 DIAGNOSIS — M5432 Sciatica, left side: Secondary | ICD-10-CM

## 2019-04-07 DIAGNOSIS — I129 Hypertensive chronic kidney disease with stage 1 through stage 4 chronic kidney disease, or unspecified chronic kidney disease: Secondary | ICD-10-CM | POA: Diagnosis not present

## 2019-04-07 DIAGNOSIS — Z87891 Personal history of nicotine dependence: Secondary | ICD-10-CM | POA: Diagnosis not present

## 2019-04-07 DIAGNOSIS — Z79899 Other long term (current) drug therapy: Secondary | ICD-10-CM | POA: Insufficient documentation

## 2019-04-07 DIAGNOSIS — N183 Chronic kidney disease, stage 3 unspecified: Secondary | ICD-10-CM | POA: Insufficient documentation

## 2019-04-07 DIAGNOSIS — M545 Low back pain: Secondary | ICD-10-CM | POA: Diagnosis not present

## 2019-04-07 DIAGNOSIS — M5442 Lumbago with sciatica, left side: Secondary | ICD-10-CM | POA: Diagnosis not present

## 2019-04-07 DIAGNOSIS — M25552 Pain in left hip: Secondary | ICD-10-CM | POA: Diagnosis present

## 2019-04-07 MED ORDER — OXYCODONE-ACETAMINOPHEN 5-325 MG PO TABS
1.0000 | ORAL_TABLET | Freq: Once | ORAL | Status: AC
  Administered 2019-04-07: 1 via ORAL
  Filled 2019-04-07: qty 1

## 2019-04-07 MED ORDER — LIDOCAINE 5 % EX PTCH: 1.0000 | MEDICATED_PATCH | CUTANEOUS | 0 refills | Status: DC

## 2019-04-07 MED ORDER — BACLOFEN 5 MG PO TABS: 5.0000 mg | ORAL_TABLET | Freq: Three times a day (TID) | ORAL | 0 refills | Status: DC | PRN

## 2019-04-07 MED ORDER — HYDROCODONE-ACETAMINOPHEN 5-325 MG PO TABS: 1.0000 | ORAL_TABLET | ORAL | 0 refills | Status: DC | PRN

## 2019-04-07 NOTE — ED Provider Notes (Signed)
[facility] Emergency Department Provider Note  ____________________________________________  Time seen: Approximately 8:39 AM  I have reviewed the triage vital signs and the nursing notes.   HISTORY  Chief Complaint Hip Pain    HPI Ryan [name] is a 63 y.o. male that presents to the emergency department for evaluation of low back pain and left leg pain on and off since July.  Pain starts in his left back and left hip and radiates down his left leg.  Pain shoots down his leg.  Patient had surgery for a DVT to the right leg in July, which is around when symptoms started. He is taking eliquis. No bowel or bladder dysfunction or saddle anesthesia. No trauma. No numbness, tingling.   Past Medical History:  Diagnosis Date  . Hyperlipidemia   . Hypertension   . Neuromuscular disorder (HCC)    numbness feet  . Peripheral vascular disease ([place])   . Shortness of breath dyspnea     Patient Active Problem List   Diagnosis Date Noted  . Ischemia of right lower extremity 01/28/2019  . Iron deficiency anemia 11/23/2018  . Stage 3 chronic kidney disease 10/21/2018  . Atherosclerosis of native arteries of extremity with rest pain ([place]) 10/13/2018  . PAD (peripheral artery disease) ([place]) 07/21/2018  . Ischemic leg 06/25/2018  . Claudication ([place]) 03/17/2018  . B12 deficiency 03/12/2018  . Vitamin B1 deficiency 03/12/2018  . Varicose veins of leg with pain, right 03/12/2018  . Enlarged thoracic aorta ([place]) 02/24/2018  . Alcoholism /alcohol abuse ([place]) 02/24/2018  . Paresthesia 02/24/2018  . Ectatic aorta ([place]) 02/20/2018  . Emphysema lung ([place]) 02/20/2018  . Atherosclerosis of aorta ([place]) 02/20/2018  . Encounter for tobacco use cessation counseling   . Benign neoplasm of cecum   . Benign neoplasm of transverse colon   . Benign neoplasm of sigmoid colon   . Hypertension, benign 08/31/2015  . Tobacco use 08/31/2015  . Abnormal CXR 08/31/2015    Past  Surgical History:  Procedure Laterality Date  . COLONOSCOPY WITH PROPOFOL N/A 02/15/2016   Procedure: COLONOSCOPY WITH PROPOFOL;  Surgeon: Lucilla Lame, MD;  Location: [place];  Service: Endoscopy;  Laterality: N/A;  . HERNIA REPAIR  1999   left inguinal  . LOWER EXTREMITY ANGIOGRAPHY Right 05/07/2018   Procedure: LOWER EXTREMITY ANGIOGRAPHY;  Surgeon: Algernon Huxley, MD;  Location: [place] [facility];  Service: Cardiovascular;  Laterality: Right;  . LOWER EXTREMITY ANGIOGRAPHY Right 06/25/2018   Procedure: LOWER EXTREMITY ANGIOGRAPHY;  Surgeon: Algernon Huxley, MD;  Location: [place] [facility];  Service: Cardiovascular;  Laterality: Right;  . LOWER EXTREMITY ANGIOGRAPHY Right 07/01/2018   Procedure: LOWER EXTREMITY ANGIOGRAPHY;  Surgeon: Algernon Huxley, MD;  Location: [place] [facility];  Service: Cardiovascular;  Laterality: Right;  . LOWER EXTREMITY ANGIOGRAPHY Right 08/12/2018   Procedure: LOWER EXTREMITY ANGIOGRAPHY;  Surgeon: Algernon Huxley, MD;  Location: [place] [facility];  Service: Cardiovascular;  Laterality: Right;  . LOWER EXTREMITY ANGIOGRAPHY Right 09/03/2018   Procedure: LOWER EXTREMITY ANGIOGRAPHY;  Surgeon: Algernon Huxley, MD;  Location: [place] [facility];  Service: Cardiovascular;  Laterality: Right;  . LOWER EXTREMITY ANGIOGRAPHY Right 09/04/2018   Procedure: Lower Extremity Angiography;  Surgeon: Algernon Huxley, MD;  Location: [place] [facility];  Service: Cardiovascular;  Laterality: Right;  . LOWER EXTREMITY ANGIOGRAPHY Right 10/01/2018   Procedure: LOWER EXTREMITY ANGIOGRAPHY;  Surgeon: Algernon Huxley, MD;  Location: [place] [facility];  Service:  Cardiovascular;  Laterality: Right;  . LOWER EXTREMITY ANGIOGRAPHY Right 10/01/2018   Procedure: Lower Extremity Angiography;  Surgeon: Algernon Huxley, MD;  Location: [place] [facility];  Service: Cardiovascular;  Laterality: Right;  . LOWER EXTREMITY ANGIOGRAPHY Right 10/15/2018   Procedure: LOWER EXTREMITY ANGIOGRAPHY;   Surgeon: Algernon Huxley, MD;  Location: [place] [facility];  Service: Cardiovascular;  Laterality: Right;  . LOWER EXTREMITY ANGIOGRAPHY Left 10/16/2018   Procedure: Lower Extremity Angiography;  Surgeon: Algernon Huxley, MD;  Location: [place] [facility];  Service: Cardiovascular;  Laterality: Left;  . LOWER EXTREMITY ANGIOGRAPHY Left 01/20/2019   Procedure: LOWER EXTREMITY ANGIOGRAPHY;  Surgeon: Algernon Huxley, MD;  Location: [place] [facility];  Service: Cardiovascular;  Laterality: Left;  . LOWER EXTREMITY ANGIOGRAPHY Right 01/21/2019   Procedure: Lower Extremity Angiography;  Surgeon: Algernon Huxley, MD;  Location: [place] [facility];  Service: Cardiovascular;  Laterality: Right;  . LOWER EXTREMITY ANGIOGRAPHY Right 01/28/2019   Procedure: LOWER EXTREMITY ANGIOGRAPHY;  Surgeon: Algernon Huxley, MD;  Location: [place] [facility];  Service: Cardiovascular;  Laterality: Right;  . LOWER EXTREMITY ANGIOGRAPHY Right 01/29/2019   Procedure: Lower Extremity Angiography;  Surgeon: Algernon Huxley, MD;  Location: [place] [facility];  Service: Cardiovascular;  Laterality: Right;  . LOWER EXTREMITY INTERVENTION Right 07/02/2018   Procedure: LOWER EXTREMITY INTERVENTION;  Surgeon: Algernon Huxley, MD;  Location: [place] [facility];  Service: Cardiovascular;  Laterality: Right;  . POLYPECTOMY N/A 02/15/2016   Procedure: POLYPECTOMY;  Surgeon: Lucilla Lame, MD;  Location: [place];  Service: Endoscopy;  Laterality: N/A;    Prior to Admission medications   Medication Sig Start Date End Date Taking? Authorizing Provider  acetaminophen (TYLENOL) 500 MG tablet Take 1 tablet (500 mg total) by mouth 2 (two) times daily. 02/10/19   Steele Sizer, MD  amLODipine (NORVASC) 2.5 MG tablet Take 1 tablet (2.5 mg total) by mouth daily. 02/10/19   Steele Sizer, MD  apixaban (ELIQUIS) 5 MG TABS tablet Take 1 tablet (5 mg total) by mouth 2 (two) times daily. 10/19/18   Stegmayer, Janalyn Harder, PA-C  aspirin EC 81 MG tablet  Take 1 tablet (81 mg total) by mouth daily. 08/13/18   Stegmayer, Joelene Millin A, PA-C  atorvastatin (LIPITOR) 20 MG tablet Take 1 tablet (20 mg total) by mouth daily. 02/10/19   Steele Sizer, MD  Baclofen 5 MG TABS Take 5 mg by mouth 3 (three) times daily as needed. 04/07/19   Laban Emperor, PA-C  Cyanocobalamin (VITAMIN B-12) 1000 MCG SUBL Place 1 tablet (1,000 mcg total) under the tongue daily. 04/30/18   Steele Sizer, MD  diclofenac sodium (VOLTAREN) 1 % GEL Apply 4 g topically 4 (four) times daily. 02/10/19   Steele Sizer, MD  Fluticasone-Umeclidin-Vilant (TRELEGY ELLIPTA) 100-62.5-25 MCG/INH AEPB Inhale 1 puff into the lungs daily. 11/20/18   Steele Sizer, MD  HYDROcodone-acetaminophen (NORCO) 5-325 MG tablet Take 1 tablet by mouth every 4 (four) hours as needed for moderate pain. 04/07/19   Laban Emperor, PA-C  ibuprofen (ADVIL) 200 MG tablet Take 200 mg by mouth every 4 (four) hours as needed.    [provider]  lidocaine (LIDODERM) 5 % Place 1 patch onto the skin daily. Remove & Discard patch within 12 hours or as directed by MD 04/07/19   Laban Emperor, PA-C  lisinopril (ZESTRIL) 20 MG tablet Take 1 tablet (20 mg total) by mouth daily. 02/10/19   Steele Sizer, MD  thiamine (VITAMIN  B-1) 50 MG tablet Take 1 tablet (50 mg total) by mouth daily. 10/21/18   Steele Sizer, MD    Allergies Patient has no known allergies.  Family History  Problem Relation Age of Onset  . COPD Mother   . Hypertension Father   . Heart attack Brother     Social History Social History   Tobacco Use  . Smoking status: Former Smoker    Packs/day: 0.10    Years: 40.00    Pack years: 4.00    Types: Cigarettes    Start date: 07/21/1978    Quit date: 08/28/2018    Years since quitting: 0.6  . Smokeless tobacco: Never Used  Substance Use Topics  . Alcohol use: Yes    Alcohol/week: 12.0 standard drinks    Types: 12 Cans of beer per week  . Drug use: Never     Review of Systems   Constitutional: No fever/chills Cardiovascular: No chest pain. Respiratory: No SOB. Gastrointestinal: No abdominal pain.  No nausea, no vomiting.  Musculoskeletal: Positive for back pain. Skin: Negative for rash, abrasions, lacerations, ecchymosis. Neurological: Negative for headaches, numbness or tingling   ____________________________________________   PHYSICAL EXAM:  VITAL SIGNS: ED Triage Vitals  Enc Vitals Group     BP 04/07/19 0827 (!) 147/96     Pulse Rate 04/07/19 0827 77     Resp 04/07/19 0827 16     Temp 04/07/19 0827 97.9 F (36.6 C)     Temp Source 04/07/19 0827 Oral     SpO2 04/07/19 0827 100 %     Weight 04/07/19 0823 135 lb (61.2 kg)     Height 04/07/19 0823 5\' 8"  (1.727 m)     Head Circumference --      Peak Flow --      Pain Score 04/07/19 0823 8     Pain Loc --      Pain Edu? --      Excl. in [place]? --      Constitutional: Alert and oriented. Well appearing and in no acute distress. Eyes: Conjunctivae are normal. PERRL. EOMI. Head: Atraumatic. ENT:      Ears:      Nose: No congestion/rhinnorhea.      Mouth/Throat: Mucous membranes are moist.  Neck: No stridor.  Cardiovascular: Normal rate, regular rhythm.  Good peripheral circulation. Respiratory: Normal respiratory effort without tachypnea or retractions. Lungs CTAB. Good air entry to the bases with no decreased or absent breath sounds. Gastrointestinal: Bowel sounds 4 quadrants. Soft and nontender to palpation. No guarding or rigidity. No palpable masses. No distention. Tenderness to palpation of left SI joint. Strength equal in lower extremities bilaterally. Full ROM of toes. No foot drop. Musculoskeletal: Full range of motion to all extremities. No gross deformities appreciated. Neurologic:  Normal speech and language. No gross focal neurologic deficits are appreciated.  Skin:  Skin is warm, dry and intact. No rash noted. Psychiatric: Mood and affect are normal. Speech and behavior are normal.  Patient exhibits appropriate insight and judgement.   ____________________________________________   LABS (all labs ordered are listed, but only abnormal results are displayed)  Labs Reviewed - No data to display ____________________________________________  EKG   ____________________________________________  RADIOLOGY Robinette Haines, personally viewed and evaluated these images (plain radiographs) as part of my medical decision making, as well as reviewing the written report by the radiologist.  Dg Lumbar Spine 2-3 Views  Result Date: 04/07/2019 CLINICAL DATA:  Low back pain radiating to left leg. EXAM:  LUMBAR SPINE - 2-3 VIEW COMPARISON:  None. FINDINGS: Normal alignment. No fracture. Mild diffuse osteopenia. Degenerative facet disease throughout the lumbar spine. Disc spaces maintained. IMPRESSION: No acute bony abnormality. Electronically Signed   By: Rolm Baptise M.D.   On: 04/07/2019 09:11   US Venous Img Lower Unilateral Left  Result Date: 04/07/2019 CLINICAL DATA:  63 year old male with a history of leg pain EXAM: LEFT LOWER EXTREMITY VENOUS DOPPLER ULTRASOUND TECHNIQUE: Gray-scale sonography with graded compression, as well as color Doppler and duplex ultrasound were performed to evaluate the lower extremity deep venous systems from the level of the common femoral vein and including the common femoral, femoral, profunda femoral, popliteal and calf veins including the posterior tibial, peroneal and gastrocnemius veins when visible. The superficial great saphenous vein was also interrogated. Spectral Doppler was utilized to evaluate flow at rest and with distal augmentation maneuvers in the common femoral, femoral and popliteal veins. COMPARISON:  None. FINDINGS: Contralateral Common Femoral Vein: Respiratory phasicity is normal and symmetric with the symptomatic side. No evidence of thrombus. Normal compressibility. Common Femoral Vein: No evidence of thrombus. Normal  compressibility, respiratory phasicity and response to augmentation. Saphenofemoral Junction: No evidence of thrombus. Normal compressibility and flow on color Doppler imaging. Profunda Femoral Vein: No evidence of thrombus. Normal compressibility and flow on color Doppler imaging. Femoral Vein: No evidence of thrombus. Normal compressibility, respiratory phasicity and response to augmentation. Popliteal Vein: No evidence of thrombus. Normal compressibility, respiratory phasicity and response to augmentation. Calf Veins: No evidence of thrombus. Normal compressibility and flow on color Doppler imaging. Superficial Great Saphenous Vein: No evidence of thrombus. Normal compressibility and flow on color Doppler imaging. Other Findings:  None. IMPRESSION: Sonographic survey of the left lower extremity negative for DVT Electronically Signed   By: Corrie Mckusick D.O.   On: 04/07/2019 09:54    ____________________________________________    PROCEDURES  Procedure(s) performed:    Procedures    Medications  oxyCODONE-acetaminophen (PERCOCET/ROXICET) 5-325 MG per tablet 1 tablet (1 tablet Oral Given 04/07/19 0851)     ____________________________________________   INITIAL IMPRESSION / ASSESSMENT AND PLAN / ED COURSE  Pertinent labs & imaging results that were available during my care of the patient were reviewed by me and considered in my medical decision making (see chart for details).  Review of the [place] CSRS was performed in accordance of the [place] prior to dispensing any controlled drugs.     Patient's diagnosis is consistent with sciatica. Ultrasound negative for DVT. Xray negative for acute bony abnormalities. Patient will be discharged home with prescriptions for flexeril and hydrocodone. Patient is to follow up with PCP as directed. Patient is given ED precautions to return to the ED for any worsening or new symptoms.  Ryan [name] was evaluated in Emergency Department on 04/07/2019  for the symptoms described in the history of present illness. He was evaluated in the context of the global COVID-19 pandemic, which necessitated consideration that the patient might be at risk for infection with the SARS-CoV-2 virus that causes COVID-19. Institutional protocols and algorithms that pertain to the evaluation of patients at risk for COVID-19 are in a state of rapid change based on information released by regulatory bodies including the CDC and federal and state organizations. These policies and algorithms were followed during the patient's care in the ED.   ____________________________________________  FINAL CLINICAL IMPRESSION(S) / ED DIAGNOSES  Final diagnoses:  Sciatica of left side      NEW MEDICATIONS STARTED DURING THIS  VISIT:  ED Discharge Orders         Ordered    Baclofen 5 MG TABS  3 times daily PRN     04/07/19 1033    HYDROcodone-acetaminophen (NORCO) 5-325 MG tablet  Every 4 hours PRN     04/07/19 1033    lidocaine (LIDODERM) 5 %  Every 24 hours     04/07/19 1035              This chart was dictated using voice recognition software/Dragon. Despite best efforts to proofread, errors can occur which can change the meaning. Any change was purely unintentional.    Laban Emperor, PA-C 04/07/19 1431    Earleen Newport, MD 04/07/19 [phone]

## 2019-04-07 NOTE — ED Triage Notes (Signed)
Pt reports pain has been there for awhile but has felt worse over the last week.

## 2019-04-07 NOTE — ED Notes (Signed)
NAD noted at time of D/C. Pt D/C into the care of his daughter, taken to lobby via wheelchair by this RN. Pt denies comments/concerns regarding D/C instructions.

## 2019-04-07 NOTE — ED Triage Notes (Signed)
Pt reports pain to left hip that radiates down his left leg. Pt denies injuries or falls.

## 2019-04-07 NOTE — ED Notes (Signed)
Pt instructed to call for his daughter to come and pick him up at this time. Will discharge upon his daughter's arrival.

## 2019-04-07 NOTE — ED Notes (Signed)
Pt presents to ED via POV with c/o L hip pain, pt states intermittent pain since surgery for blood clot in July on the R side. Pt states L lower back pain that radiates down his leg and stops in his L calf at this time. Pt states does a lot of walking for work, states pain worse when standing/moving.

## 2019-04-08 DIAGNOSIS — M5416 Radiculopathy, lumbar region: Secondary | ICD-10-CM | POA: Diagnosis not present

## 2019-04-08 DIAGNOSIS — M79605 Pain in left leg: Secondary | ICD-10-CM | POA: Diagnosis not present

## 2019-04-08 DIAGNOSIS — Z95820 Peripheral vascular angioplasty status with implants and grafts: Secondary | ICD-10-CM | POA: Diagnosis not present

## 2019-04-08 DIAGNOSIS — Z7982 Long term (current) use of aspirin: Secondary | ICD-10-CM | POA: Diagnosis not present

## 2019-04-08 DIAGNOSIS — Z87891 Personal history of nicotine dependence: Secondary | ICD-10-CM | POA: Diagnosis not present

## 2019-04-08 DIAGNOSIS — Z79899 Other long term (current) drug therapy: Secondary | ICD-10-CM | POA: Diagnosis not present

## 2019-04-08 DIAGNOSIS — I739 Peripheral vascular disease, unspecified: Secondary | ICD-10-CM | POA: Diagnosis not present

## 2019-04-08 DIAGNOSIS — I77810 Thoracic aortic ectasia: Secondary | ICD-10-CM | POA: Diagnosis not present

## 2019-04-08 DIAGNOSIS — I10 Essential (primary) hypertension: Secondary | ICD-10-CM | POA: Diagnosis not present

## 2019-04-09 ENCOUNTER — Encounter: Payer: Self-pay | Admitting: Family Medicine

## 2019-04-09 ENCOUNTER — Ambulatory Visit (INDEPENDENT_AMBULATORY_CARE_PROVIDER_SITE_OTHER): Payer: BC Managed Care – PPO | Admitting: Family Medicine

## 2019-04-09 ENCOUNTER — Other Ambulatory Visit: Payer: Self-pay

## 2019-04-09 VITALS — BP 130/80 | HR 83 | Temp 97.8°F | Resp 16 | Ht 68.0 in | Wt 132.9 lb

## 2019-04-09 DIAGNOSIS — Z23 Encounter for immunization: Secondary | ICD-10-CM | POA: Diagnosis not present

## 2019-04-09 DIAGNOSIS — M5416 Radiculopathy, lumbar region: Secondary | ICD-10-CM

## 2019-04-09 MED ORDER — PREGABALIN 75 MG PO CAPS: 75.0000 mg | ORAL_CAPSULE | Freq: Two times a day (BID) | ORAL | 0 refills | Status: DC

## 2019-04-09 NOTE — Progress Notes (Signed)
Name: Ryan [name]   MRN: [id number]    DOB: July 12, 1955   Date:04/09/2019       Progress Note  Subjective  Chief Complaint  Chief Complaint  Patient presents with  . Back Pain  . Leg Pain  . 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 He states he first noticed discomfort on left lateral thigh about a month ago, the pain radiated to left lower back and now going down to left lateral calf. He states at times the pain is shooting, other times feels like water going up and down his leg. He denies weakness, no bowel or bladder incontinence. He went to [facility] on 04/07/2019, had X-ray lumbar spine that showed some facet disease . He also had negative doppler US. He was sent home on baclofen, hydrocodone and lidoderm patch. He states he has not been able to go to work since Monday because of the pain. He has an appointment scheduled with Dr. Sharlet Salina next week. We discussed options, we will keep him out of work for now and try adding Lyrica to help with pain since it seems to be nerve related.    Patient Active Problem List   Diagnosis Date Noted  . Ischemia of right lower extremity 01/28/2019  . Iron deficiency anemia 11/23/2018  . Stage 3 chronic kidney disease 10/21/2018  . Atherosclerosis of native arteries of extremity with rest pain ([place]) 10/13/2018  . PAD (peripheral artery disease) ([place]) 07/21/2018  . Ischemic leg 06/25/2018  . Claudication ([place]) 03/17/2018  . B12 deficiency 03/12/2018  . Vitamin B1 deficiency 03/12/2018  . Varicose veins of leg with pain, right 03/12/2018  . Enlarged thoracic aorta ([place]) 02/24/2018  . Alcoholism /alcohol abuse ([place]) 02/24/2018  . Paresthesia 02/24/2018  . Ectatic aorta ([place]) 02/20/2018  . Emphysema lung ([place]) 02/20/2018  . Atherosclerosis of aorta ([place]) 02/20/2018  . Encounter for tobacco use cessation counseling   . Benign neoplasm of cecum   . Benign neoplasm of  transverse colon   . Benign neoplasm of sigmoid colon   . Hypertension, benign 08/31/2015  . Tobacco use 08/31/2015  . Abnormal CXR 08/31/2015    Past Surgical History:  Procedure Laterality Date  . COLONOSCOPY WITH PROPOFOL N/A 02/15/2016   Procedure: COLONOSCOPY WITH PROPOFOL;  Surgeon: Lucilla Lame, MD;  Location: [place];  Service: Endoscopy;  Laterality: N/A;  . HERNIA REPAIR  1999   left inguinal  . LOWER EXTREMITY ANGIOGRAPHY Right 05/07/2018   Procedure: LOWER EXTREMITY ANGIOGRAPHY;  Surgeon: Algernon Huxley, MD;  Location: [place] [facility];  Service: Cardiovascular;  Laterality: Right;  . LOWER EXTREMITY ANGIOGRAPHY Right 06/25/2018   Procedure: LOWER EXTREMITY ANGIOGRAPHY;  Surgeon: Algernon Huxley, MD;  Location: [place] [facility];  Service: Cardiovascular;  Laterality: Right;  . LOWER EXTREMITY ANGIOGRAPHY Right 07/01/2018   Procedure: LOWER EXTREMITY ANGIOGRAPHY;  Surgeon: Algernon Huxley, MD;  Location: [place] [facility];  Service: Cardiovascular;  Laterality: Right;  . LOWER EXTREMITY ANGIOGRAPHY Right 08/12/2018   Procedure: LOWER EXTREMITY ANGIOGRAPHY;  Surgeon: Algernon Huxley, MD;  Location: [facility] [facility];  Service: Cardiovascular;  Laterality: Right;  . LOWER EXTREMITY ANGIOGRAPHY Right 09/03/2018   Procedure: LOWER EXTREMITY ANGIOGRAPHY;  Surgeon: Algernon Huxley, MD;  Location: [facility];  Service: Cardiovascular;  Laterality: Right;  . LOWER EXTREMITY ANGIOGRAPHY Right 09/04/2018   Procedure: Lower Extremity  Angiography;  Surgeon: Algernon Huxley, MD;  Location: [facility];  Service: Cardiovascular;  Laterality: Right;  . LOWER EXTREMITY ANGIOGRAPHY Right 10/01/2018   Procedure: LOWER EXTREMITY ANGIOGRAPHY;  Surgeon: Algernon Huxley, MD;  Location: [place] [facility];  Service: Cardiovascular;  Laterality: Right;  . LOWER EXTREMITY ANGIOGRAPHY Right 10/01/2018   Procedure: Lower Extremity Angiography;  Surgeon: Algernon Huxley, MD;  Location: [facility];  Service: Cardiovascular;  Laterality: Right;  . LOWER EXTREMITY ANGIOGRAPHY Right 10/15/2018   Procedure: LOWER EXTREMITY ANGIOGRAPHY;  Surgeon: Algernon Huxley, MD;  Location: [place] [facility];  Service: Cardiovascular;  Laterality: Right;  . LOWER EXTREMITY ANGIOGRAPHY Left 10/16/2018   Procedure: Lower Extremity Angiography;  Surgeon: Algernon Huxley, MD;  Location: [place] [facility];  Service: Cardiovascular;  Laterality: Left;  . LOWER EXTREMITY ANGIOGRAPHY Left 01/20/2019   Procedure: LOWER EXTREMITY ANGIOGRAPHY;  Surgeon: Algernon Huxley, MD;  Location: [place] [facility];  Service: Cardiovascular;  Laterality: Left;  . LOWER EXTREMITY ANGIOGRAPHY Right 01/21/2019   Procedure: Lower Extremity Angiography;  Surgeon: Algernon Huxley, MD;  Location: [place] [facility];  Service: Cardiovascular;  Laterality: Right;  . LOWER EXTREMITY ANGIOGRAPHY Right 01/28/2019   Procedure: LOWER EXTREMITY ANGIOGRAPHY;  Surgeon: Algernon Huxley, MD;  Location: [place] [facility];  Service: Cardiovascular;  Laterality: Right;  . LOWER EXTREMITY ANGIOGRAPHY Right 01/29/2019   Procedure: Lower Extremity Angiography;  Surgeon: Algernon Huxley, MD;  Location: [facility];  Service: Cardiovascular;  Laterality: Right;  . LOWER EXTREMITY INTERVENTION Right 07/02/2018   Procedure: LOWER EXTREMITY INTERVENTION;  Surgeon: Algernon Huxley, MD;  Location: [place] [facility];  Service: Cardiovascular;  Laterality: Right;  . POLYPECTOMY N/A 02/15/2016   Procedure: POLYPECTOMY;  Surgeon: Lucilla Lame, MD;  Location: [facility];  Service: Endoscopy;  Laterality: N/A;    Family History  Problem Relation Age of Onset  . COPD Mother   . Hypertension Father   . Heart attack Brother     Social History   Socioeconomic History  . Marital status: Legally Separated    Spouse name: [name]  . Number of children: 5  . Years of education: Not on file  . Highest education level: High school graduate   Occupational History  . Occupation: Forklift    Comment: [name]  Social Needs  . Financial resource strain: Not hard at all  . Food insecurity    Worry: Never true    Inability: Never true  . Transportation needs    Medical: No    Non-medical: No  Tobacco Use  . Smoking status: Former Smoker    Packs/day: 0.10    Years: 40.00    Pack years: 4.00    Types: Cigarettes    Start date: 07/21/1978    Quit date: 08/28/2018    Years since quitting: 0.6  . Smokeless tobacco: Never Used  Substance and Sexual Activity  . Alcohol use: Yes    Alcohol/week: 12.0 standard drinks    Types: 12 Cans of beer per week  . Drug use: Never  . Sexual activity: Yes    Partners: Female    Birth control/protection: None  Lifestyle  . Physical activity    Days per week: 6 days    Minutes per session: 60 min  . Stress: Not at all  Relationships  . Social Herbalist on phone: Three times a week  Gets together: Twice a week    Attends religious service: Never    Active member of club or organization: No    Attends meetings of clubs or organizations: Never    Relationship status: Married  . Intimate partner violence    Fear of current or ex partner: No    Emotionally abused: No    Physically abused: No    Forced sexual activity: No  Other Topics Concern  . Not on file  Social History Narrative  . Not on file     Current Outpatient Medications:  .  acetaminophen (TYLENOL) 500 MG tablet, Take 1 tablet (500 mg total) by mouth 2 (two) times daily., Disp: 100 tablet, Rfl: 0 .  amLODipine (NORVASC) 2.5 MG tablet, Take 1 tablet (2.5 mg total) by mouth daily., Disp: 90 tablet, Rfl: 0 .  apixaban (ELIQUIS) 5 MG TABS tablet, Take 1 tablet (5 mg total) by mouth 2 (two) times daily., Disp: 180 tablet, Rfl: 3 .  aspirin EC 81 MG tablet, Take 1 tablet (81 mg total) by mouth daily., Disp: 90 tablet, Rfl: 3 .  atorvastatin (LIPITOR) 20 MG tablet, Take 1 tablet (20 mg total) by mouth  daily., Disp: 90 tablet, Rfl: 1 .  Baclofen 5 MG TABS, Take 5 mg by mouth 3 (three) times daily as needed., Disp: 15 tablet, Rfl: 0 .  Cyanocobalamin (VITAMIN B-12) 1000 MCG SUBL, Place 1 tablet (1,000 mcg total) under the tongue daily., Disp: 90 tablet, Rfl: 0 .  diclofenac sodium (VOLTAREN) 1 % GEL, Apply 4 g topically 4 (four) times daily., Disp: 100 g, Rfl: 0 .  Fluticasone-Umeclidin-Vilant (TRELEGY ELLIPTA) 100-62.5-25 MCG/INH AEPB, Inhale 1 puff into the lungs daily., Disp: 60 each, Rfl: 2 .  HYDROcodone-acetaminophen (NORCO) 5-325 MG tablet, Take 1 tablet by mouth every 4 (four) hours as needed for moderate pain., Disp: 6 tablet, Rfl: 0 .  lidocaine (LIDODERM) 5 %, Place 1 patch onto the skin daily. Remove & Discard patch within 12 hours or as directed by MD, Disp: 30 patch, Rfl: 0 .  lisinopril (ZESTRIL) 20 MG tablet, Take 1 tablet (20 mg total) by mouth daily., Disp: 90 tablet, Rfl: 0 .  thiamine (VITAMIN B-1) 50 MG tablet, Take 1 tablet (50 mg total) by mouth daily., Disp: 30 tablet, Rfl: 2 .  ibuprofen (ADVIL) 200 MG tablet, Take 200 mg by mouth every 4 (four) hours as needed., Disp: , Rfl:   No Known Allergies  I personally reviewed active problem list, medication list, allergies, family history, social history, health maintenance with the patient/caregiver today.   ROS  Ten systems reviewed and is negative except as mentioned in HPI   Objective  Vitals:   04/09/19 1433  BP: 130/80  Pulse: 83  Resp: 16  Temp: 97.8 F (36.6 C)  TempSrc: Temporal  SpO2: 97%  Weight: 132 lb 14.4 oz (60.3 kg)  Height: 5\' 8"  (1.727 m)    Body mass index is 20.21 kg/m.  Physical Exam  Constitutional: Patient appears well-developed and very thin  No distress.  HEENT: head atraumatic, normocephalic, neck supple Cardiovascular: Normal rate, regular rhythm and normal heart sounds.  No murmur heard. No BLE edema. Pulmonary/Chest: Effort normal and breath sounds normal. No respiratory  distress. Abdominal: Soft.  There is no tenderness. Muscular skeletal: pain during palpation of lumbar spine on left lower side, positive straight leg raise on left side, pain during rom of lumbar spine, worse with extension of spine  Psychiatric: Patient has a  normal mood and affect. behavior is normal. Judgment and thought content normal.  Recent Results (from the past 2160 hour(s))  SARS Coronavirus 2 (Performed in [facility] hospital lab)     Status: None   Collection Time: 01/18/19  3:12 PM   Specimen: Nasal Swab  Result Value Ref Range   SARS Coronavirus 2 NEGATIVE NEGATIVE    Comment: (NOTE) SARS-CoV-2 target nucleic acids are NOT DETECTED. The SARS-CoV-2 RNA is generally detectable in upper and lower respiratory specimens during the acute phase of infection. Negative results do not preclude SARS-CoV-2 infection, do not rule out co-infections with other pathogens, and should not be used as the sole basis for treatment or other patient management decisions. Negative results must be combined with clinical observations, patient history, and epidemiological information. The expected result is Negative. Fact Sheet for Patients: SugarRoll.be Fact Sheet for Healthcare Providers: https://www.woods-mathews.com/ This test is not yet approved or cleared by the [place] FDA and  has been authorized for detection and/or diagnosis of SARS-CoV-2 by FDA under an Emergency Use Authorization (EUA). This EUA will remain  in effect (meaning this test can be used) for the duration of the COVID-19 declaration under Section 56 4(b)(1) of the Act, 21 U.S.C. section 360bbb-3(b)(1), unless the authorization is terminated or revoked sooner. Performed at [facility], [place] [address] [place], [place] [address]   BUN     Status: None   Collection Time: 01/20/19 11:32 AM  Result Value Ref Range   BUN 23 8 - 23 mg/dL    Comment: Performed at  [facility], [place], [place] [address]  Creatinine, serum     Status: Abnormal   Collection Time: 01/20/19 11:32 AM  Result Value Ref Range   Creatinine, Ser 1.49 (H) 0.61 - 1.24 mg/dL   GFR calc non Af Amer 50 (L) >60 mL/min   GFR calc Af Amer 58 (L) >60 mL/min    Comment: Performed at [facility], [place], [place] [address]  Glucose, capillary     Status: Abnormal   Collection Time: 01/20/19  4:14 PM  Result Value Ref Range   Glucose-Capillary 113 (H) 70 - 99 mg/dL  MRSA PCR Screening     Status: None   Collection Time: 01/20/19  4:25 PM   Specimen: Nasal Mucosa; Nasopharyngeal  Result Value Ref Range   MRSA by PCR NEGATIVE NEGATIVE    Comment:        The GeneXpert MRSA Assay (FDA approved for NASAL specimens only), is one component of a comprehensive MRSA colonization surveillance program. It is not intended to diagnose MRSA infection nor to guide or monitor treatment for MRSA infections. Performed at [facility], [place], [place] [address]   Heparin level (unfractionated) every 6 hours x 4 post-procedure     Status: None   Collection Time: 01/20/19  4:30 PM  Result Value Ref Range   Heparin Unfractionated 0.50 0.30 - 0.70 IU/mL    Comment: (NOTE) If heparin results are below expected values, and patient dosage has  been confirmed, suggest follow up testing of antithrombin III levels. Performed at [facility], [address] [place], [place] [address]   CBC every 6 hours x 4 post-procedure     Status: Abnormal   Collection Time: 01/20/19  4:30 PM  Result Value Ref Range   WBC 4.8 4.0 - 10.5 K/uL   RBC 3.83 (L) 4.22 - 5.81 MIL/uL  Hemoglobin 11.5 (L) 13.0 - 17.0 g/dL   HCT 35.1 (L) 39.0 - 52.0 %   MCV 91.6 80.0 - 100.0 fL   MCH 30.0 26.0 - 34.0 pg   MCHC 32.8 30.0 - 36.0 g/dL   RDW 15.3 11.5 - 15.5 %   Platelets 201 150 - 400 K/uL   nRBC 0.0 0.0 - 0.2 %    Comment:  Performed at [facility], [address] [place], [place] [address]  Fibrinogen every 6 hours x 4 post-procedure     Status: None   Collection Time: 01/20/19  4:30 PM  Result Value Ref Range   Fibrinogen 342 210 - 475 mg/dL    Comment: Performed at [facility], [address] [place], [place] [address]  Protime-INR     Status: None   Collection Time: 01/20/19  4:31 PM  Result Value Ref Range   Prothrombin Time 14.5 11.4 - 15.2 seconds   INR 1.1 0.8 - 1.2    Comment: (NOTE) INR goal varies based on device and disease states. Performed at [facility], [address] [place], [place] [address]   APTT     Status: Abnormal   Collection Time: 01/20/19  4:31 PM  Result Value Ref Range   aPTT 85 (H) 24 - 36 seconds    Comment:        IF BASELINE aPTT IS ELEVATED, SUGGEST PATIENT RISK ASSESSMENT BE USED TO DETERMINE APPROPRIATE ANTICOAGULANT THERAPY. Performed at [facility], [place], [place] [address]   Heparin level (unfractionated) every 6 hours x 4 post-procedure     Status: None   Collection Time: 01/20/19  7:08 PM  Result Value Ref Range   Heparin Unfractionated 0.34 0.30 - 0.70 IU/mL    Comment: (NOTE) If heparin results are below expected values, and patient dosage has  been confirmed, suggest follow up testing of antithrombin III levels. Performed at [facility], [address] [place], [place] [address]   CBC every 6 hours x 4 post-procedure     Status: Abnormal   Collection Time: 01/20/19  7:08 PM  Result Value Ref Range   WBC 6.8 4.0 - 10.5 K/uL   RBC 3.67 (L) 4.22 - 5.81 MIL/uL   Hemoglobin 11.1 (L) 13.0 - 17.0 g/dL   HCT 33.6 (L) 39.0 - 52.0 %   MCV 91.6 80.0 - 100.0 fL   MCH 30.2 26.0 - 34.0 pg   MCHC 33.0 30.0 - 36.0 g/dL   RDW 15.1 11.5 - 15.5 %   Platelets 186 150 - 400 K/uL   nRBC 0.0 0.0 - 0.2 %    Comment: Performed at [facility], [address] [place], [place]  [address]  Fibrinogen every 6 hours x 4 post-procedure     Status: None   Collection Time: 01/20/19  7:08 PM  Result Value Ref Range   Fibrinogen 252 210 - 475 mg/dL    Comment: Performed at [facility], [address] [place], [place] [address]  Heparin level (unfractionated) every 6 hours x 4 post-procedure     Status: Abnormal   Collection Time: 01/21/19  1:31 AM  Result Value Ref Range   Heparin Unfractionated 0.21 (L) 0.30 - 0.70 IU/mL    Comment: (NOTE) If heparin results are below expected values, and patient dosage has  been confirmed, suggest follow up testing of antithrombin III levels. Performed at [facility], [address] [place], [place] [address]  CBC every 6 hours x 4 post-procedure     Status: Abnormal   Collection Time: 01/21/19  1:31 AM  Result Value Ref Range   WBC 5.6 4.0 - 10.5 K/uL   RBC 3.49 (L) 4.22 - 5.81 MIL/uL   Hemoglobin 10.4 (L) 13.0 - 17.0 g/dL   HCT 31.9 (L) 39.0 - 52.0 %   MCV 91.4 80.0 - 100.0 fL   MCH 29.8 26.0 - 34.0 pg   MCHC 32.6 30.0 - 36.0 g/dL   RDW 15.3 11.5 - 15.5 %   Platelets 165 150 - 400 K/uL   nRBC 0.0 0.0 - 0.2 %    Comment: Performed at [facility], [address] [place], [place] [address]  Fibrinogen every 6 hours x 4 post-procedure     Status: None   Collection Time: 01/21/19  1:31 AM  Result Value Ref Range   Fibrinogen 243 210 - 475 mg/dL    Comment: Performed at [facility], [address] [place], [place] [id number]  Basic metabolic panel     Status: Abnormal   Collection Time: 01/21/19  1:31 AM  Result Value Ref Range   Sodium 138 135 - 145 mmol/L   Potassium 5.1 3.5 - 5.1 mmol/L   Chloride 109 98 - 111 mmol/L   CO2 23 22 - 32 mmol/L   Glucose, Bld 112 (H) 70 - 99 mg/dL   BUN 24 (H) 8 - 23 mg/dL   Creatinine, Ser 1.88 (H) 0.61 - 1.24 mg/dL   Calcium 8.4 (L) 8.9 - 10.3 mg/dL   GFR calc non Af Amer 38 (L) >60 mL/min   GFR calc Af Amer 44 (L) >60 mL/min   Anion gap 6 5  - 15    Comment: Performed at [facility], [address], [place], [place] [address]  Magnesium     Status: None   Collection Time: 01/21/19  1:31 AM  Result Value Ref Range   Magnesium 2.0 1.7 - 2.4 mg/dL    Comment: Performed at [facility], [place], [place] [address]  Heparin level (unfractionated) every 6 hours x 4 post-procedure     Status: Abnormal   Collection Time: 01/21/19 10:15 AM  Result Value Ref Range   Heparin Unfractionated 0.24 (L) 0.30 - 0.70 IU/mL    Comment: (NOTE) If heparin results are below expected values, and patient dosage has  been confirmed, suggest follow up testing of antithrombin III levels. Performed at [facility], [address] [place], [place] [address]   CBC every 6 hours x 4 post-procedure     Status: Abnormal   Collection Time: 01/21/19 10:15 AM  Result Value Ref Range   WBC 6.5 4.0 - 10.5 K/uL   RBC 3.03 (L) 4.22 - 5.81 MIL/uL   Hemoglobin 9.1 (L) 13.0 - 17.0 g/dL   HCT 28.0 (L) 39.0 - 52.0 %   MCV 92.4 80.0 - 100.0 fL   MCH 30.0 26.0 - 34.0 pg   MCHC 32.5 30.0 - 36.0 g/dL   RDW 15.5 11.5 - 15.5 %   Platelets 172 150 - 400 K/uL   nRBC 0.0 0.0 - 0.2 %    Comment: Performed at [facility], [address] [place], [place] [address]  Fibrinogen every 6 hours x 4 post-procedure     Status: None   Collection Time: 01/21/19 10:15 AM  Result Value Ref Range   Fibrinogen 245 210 - 475 mg/dL  Comment: Performed at [facility], [address] [place], [place] [address]  CBC     Status: Abnormal   Collection Time: 01/23/19  5:52 AM  Result Value Ref Range   WBC 6.5 4.0 - 10.5 K/uL   RBC 2.55 (L) 4.22 - 5.81 MIL/uL   Hemoglobin 7.7 (L) 13.0 - 17.0 g/dL   HCT 22.8 (L) 39.0 - 52.0 %   MCV 89.4 80.0 - 100.0 fL   MCH 30.2 26.0 - 34.0 pg   MCHC 33.8 30.0 - 36.0 g/dL   RDW 15.0 11.5 - 15.5 %   Platelets 156 150 - 400 K/uL   nRBC 0.0 0.0 - 0.2 %    Comment: Performed at  [facility], [address] [place], [place] [id number]  Basic metabolic panel     Status: Abnormal   Collection Time: 01/23/19  5:52 AM  Result Value Ref Range   Sodium 135 135 - 145 mmol/L   Potassium 4.3 3.5 - 5.1 mmol/L   Chloride 101 98 - 111 mmol/L   CO2 25 22 - 32 mmol/L   Glucose, Bld 101 (H) 70 - 99 mg/dL   BUN 14 8 - 23 mg/dL   Creatinine, Ser 1.43 (H) 0.61 - 1.24 mg/dL   Calcium 8.5 (L) 8.9 - 10.3 mg/dL   GFR calc non Af Amer 52 (L) >60 mL/min   GFR calc Af Amer >60 >60 mL/min   Anion gap 9 5 - 15    Comment: Performed at [facility], [address] [place], [place] [address]  Magnesium     Status: None   Collection Time: 01/23/19  5:52 AM  Result Value Ref Range   Magnesium 2.0 1.7 - 2.4 mg/dL    Comment: Performed at [facility], [address] [place], [place] [address]  SARS Coronavirus 2 [facility] order, Performed in [facility] hospital lab) Nasopharyngeal Nasopharyngeal Swab     Status: None   Collection Time: 01/28/19  1:12 PM   Specimen: Nasopharyngeal Swab  Result Value Ref Range   SARS Coronavirus 2 NEGATIVE NEGATIVE    Comment: (NOTE) If result is NEGATIVE SARS-CoV-2 target nucleic acids are NOT DETECTED. The SARS-CoV-2 RNA is generally detectable in upper and lower  respiratory specimens during the acute phase of infection. The lowest  concentration of SARS-CoV-2 viral copies this assay can detect is 250  copies / mL. A negative result does not preclude SARS-CoV-2 infection  and should not be used as the sole basis for treatment or other  patient management decisions.  A negative result may occur with  improper specimen collection / handling, submission of specimen other  than nasopharyngeal swab, presence of viral mutation(s) within the  areas targeted by this assay, and inadequate number of viral copies  (<250 copies / mL). A negative result must be combined with clinical  observations, patient history, and  epidemiological information. If result is POSITIVE SARS-CoV-2 target nucleic acids are DETECTED. The SARS-CoV-2 RNA is generally detectable in upper and lower  respiratory specimens dur ing the acute phase of infection.  Positive  results are indicative of active infection with SARS-CoV-2.  Clinical  correlation with patient history and other diagnostic information is  necessary to determine patient infection status.  Positive results do  not rule out bacterial infection or co-infection with other viruses. If result is PRESUMPTIVE POSTIVE SARS-CoV-2 nucleic acids MAY BE PRESENT.   A presumptive positive result was obtained on the submitted specimen  and  confirmed on repeat testing.  While 2019 novel coronavirus  (SARS-CoV-2) nucleic acids may be present in the submitted sample  additional confirmatory testing may be necessary for epidemiological  and / or clinical management purposes  to differentiate between  SARS-CoV-2 and other Sarbecovirus currently known to infect humans.  If clinically indicated additional testing with an alternate test  methodology [phone]) is advised. The SARS-CoV-2 RNA is generally  detectable in upper and lower respiratory sp ecimens during the acute  phase of infection. The expected result is Negative. Fact Sheet for Patients:  StrictlyIdeas.no Fact Sheet for Healthcare Providers: BankingDealers.co.za This test is not yet approved or cleared by the [place] FDA and has been authorized for detection and/or diagnosis of SARS-CoV-2 by FDA under an Emergency Use Authorization (EUA).  This EUA will remain in effect (meaning this test can be used) for the duration of the COVID-19 declaration under Section 564(b)(1) of the Act, 21 U.S.C. section 360bbb-3(b)(1), unless the authorization is terminated or revoked sooner. Performed at [facility], [address] [place], [place] [address]   BUN      Status: None   Collection Time: 01/28/19  3:03 PM  Result Value Ref Range   BUN 22 8 - 23 mg/dL    Comment: Performed at [facility], [address] [place], [place] [address]  Creatinine, serum     Status: Abnormal   Collection Time: 01/28/19  3:03 PM  Result Value Ref Range   Creatinine, Ser 1.56 (H) 0.61 - 1.24 mg/dL   GFR calc non Af Amer 47 (L) >60 mL/min   GFR calc Af Amer 55 (L) >60 mL/min    Comment: Performed at [facility], [address] [place], [place] [address]  CBC     Status: Abnormal   Collection Time: 01/28/19  3:03 PM  Result Value Ref Range   WBC 7.9 4.0 - 10.5 K/uL   RBC 3.10 (L) 4.22 - 5.81 MIL/uL   Hemoglobin 9.4 (L) 13.0 - 17.0 g/dL   HCT 28.9 (L) 39.0 - 52.0 %   MCV 93.2 80.0 - 100.0 fL   MCH 30.3 26.0 - 34.0 pg   MCHC 32.5 30.0 - 36.0 g/dL   RDW 14.6 11.5 - 15.5 %   Platelets 488 (H) 150 - 400 K/uL   nRBC 0.0 0.0 - 0.2 %    Comment: Performed at [facility], [address] [place], [place] [address]  Glucose, capillary     Status: None   Collection Time: 01/28/19  5:14 PM  Result Value Ref Range   Glucose-Capillary 86 70 - 99 mg/dL  Heparin level (unfractionated) every 6 hours x 4 post-procedure     Status: Abnormal   Collection Time: 01/28/19  5:53 PM  Result Value Ref Range   Heparin Unfractionated 2.84 (H) 0.30 - 0.70 IU/mL    Comment: RESULTS CONFIRMED BY MANUAL DILUTION Performed at [facility], [address] [place], [place] [address]   CBC every 6 hours x 4 post-procedure     Status: Abnormal   Collection Time: 01/28/19  5:53 PM  Result Value Ref Range   WBC 9.3 4.0 - 10.5 K/uL   RBC 3.10 (L) 4.22 - 5.81 MIL/uL   Hemoglobin 9.2 (L) 13.0 - 17.0 g/dL   HCT 28.9 (L) 39.0 - 52.0 %   MCV 93.2 80.0 - 100.0 fL   MCH 29.7 26.0 - 34.0 pg   MCHC 31.8 30.0 - 36.0 g/dL  RDW 14.7 11.5 - 15.5 %   Platelets 465 (H) 150 - 400 K/uL   nRBC 0.0 0.0 - 0.2 %    Comment: Performed at [facility],  [address] [place], [place] [address]  Fibrinogen every 6 hours x 4 post-procedure     Status: Abnormal   Collection Time: 01/28/19  5:53 PM  Result Value Ref Range   Fibrinogen 634 (H) 210 - 475 mg/dL    Comment: Performed at [facility], [address] [place], [place] [address]  Type and screen     Status: None   Collection Time: 01/28/19  5:53 PM  Result Value Ref Range   ABO/RH(D) A POS    Antibody Screen NEG    Sample Expiration      01/31/2019,2359 Performed at [facility], [address] [place], [place] [address]   Fibrinogen every 6 hours x 4 post-procedure     Status: None   Collection Time: 01/28/19 10:07 PM  Result Value Ref Range   Fibrinogen 317 210 - 475 mg/dL    Comment: Performed at [facility], [place], [place] [address]  Heparin level (unfractionated)     Status: Abnormal   Collection Time: 01/28/19 10:07 PM  Result Value Ref Range   Heparin Unfractionated 2.18 (H) 0.30 - 0.70 IU/mL    Comment: RESULTS CONFIRMED BY MANUAL DILUTION (NOTE) If heparin results are below expected values, and patient dosage has  been confirmed, suggest follow up testing of antithrombin III levels. Performed at [facility], [address] [place], [place] [address]   CBC     Status: Abnormal   Collection Time: 01/28/19 10:07 PM  Result Value Ref Range   WBC 9.9 4.0 - 10.5 K/uL   RBC 2.86 (L) 4.22 - 5.81 MIL/uL   Hemoglobin 8.6 (L) 13.0 - 17.0 g/dL   HCT 25.9 (L) 39.0 - 52.0 %   MCV 90.6 80.0 - 100.0 fL   MCH 30.1 26.0 - 34.0 pg   MCHC 33.2 30.0 - 36.0 g/dL   RDW 14.6 11.5 - 15.5 %   Platelets 374 150 - 400 K/uL   nRBC 0.0 0.0 - 0.2 %    Comment: Performed at [facility], [address] [place], [place] [address]  APTT     Status: Abnormal   Collection Time: 01/28/19 10:07 PM  Result Value Ref Range   aPTT 54 (H) 24 - 36 seconds    Comment:        IF BASELINE aPTT IS ELEVATED, SUGGEST  PATIENT RISK ASSESSMENT BE USED TO DETERMINE APPROPRIATE ANTICOAGULANT THERAPY. Performed at [facility], [place], [place] [address]   Heparin level (unfractionated) every 6 hours x 4 post-procedure     Status: Abnormal   Collection Time: 01/29/19  3:10 AM  Result Value Ref Range   Heparin Unfractionated 1.54 (H) 0.30 - 0.70 IU/mL    Comment: (NOTE) If heparin results are below expected values, and patient dosage has  been confirmed, suggest follow up testing of antithrombin III levels. Performed at [facility], [address] [place], [place] [address]   CBC every 6 hours x 4 post-procedure     Status: Abnormal   Collection Time: 01/29/19  3:10 AM  Result Value Ref Range   WBC 9.6 4.0 - 10.5 K/uL   RBC 2.95 (L) 4.22 - 5.81 MIL/uL   Hemoglobin 8.9 (L) 13.0 - 17.0 g/dL  HCT 26.5 (L) 39.0 - 52.0 %   MCV 89.8 80.0 - 100.0 fL   MCH 30.2 26.0 - 34.0 pg   MCHC 33.6 30.0 - 36.0 g/dL   RDW 14.6 11.5 - 15.5 %   Platelets 359 150 - 400 K/uL   nRBC 0.0 0.0 - 0.2 %    Comment: Performed at [facility], [address] [place], [place] [address]  Fibrinogen every 6 hours x 4 post-procedure     Status: Abnormal   Collection Time: 01/29/19  3:10 AM  Result Value Ref Range   Fibrinogen 161 (L) 210 - 475 mg/dL    Comment: Performed at [facility], [address] [place], [place] [id number]  Basic metabolic panel     Status: Abnormal   Collection Time: 01/29/19  3:10 AM  Result Value Ref Range   Sodium 135 135 - 145 mmol/L   Potassium 5.1 3.5 - 5.1 mmol/L   Chloride 104 98 - 111 mmol/L   CO2 21 (L) 22 - 32 mmol/L   Glucose, Bld 129 (H) 70 - 99 mg/dL   BUN 25 (H) 8 - 23 mg/dL   Creatinine, Ser 1.76 (H) 0.61 - 1.24 mg/dL   Calcium 8.7 (L) 8.9 - 10.3 mg/dL   GFR calc non Af Amer 41 (L) >60 mL/min   GFR calc Af Amer 47 (L) >60 mL/min   Anion gap 10 5 - 15    Comment: Performed at [facility], [address],  [place], [place] [address]  Magnesium     Status: None   Collection Time: 01/29/19  3:10 AM  Result Value Ref Range   Magnesium 1.9 1.7 - 2.4 mg/dL    Comment: Performed at [facility], [place], [place] [address]  Heparin level (unfractionated) every 6 hours x 4 post-procedure     Status: Abnormal   Collection Time: 01/29/19  7:40 AM  Result Value Ref Range   Heparin Unfractionated 1.35 (H) 0.30 - 0.70 IU/mL    Comment: (NOTE) If heparin results are below expected values, and patient dosage has  been confirmed, suggest follow up testing of antithrombin III levels. Performed at [facility], [address] [place], [place] [address]   CBC every 6 hours x 4 post-procedure     Status: Abnormal   Collection Time: 01/29/19  7:40 AM  Result Value Ref Range   WBC 8.2 4.0 - 10.5 K/uL   RBC 2.78 (L) 4.22 - 5.81 MIL/uL   Hemoglobin 8.3 (L) 13.0 - 17.0 g/dL   HCT 26.0 (L) 39.0 - 52.0 %   MCV 93.5 80.0 - 100.0 fL   MCH 29.9 26.0 - 34.0 pg   MCHC 31.9 30.0 - 36.0 g/dL   RDW 14.6 11.5 - 15.5 %   Platelets 335 150 - 400 K/uL   nRBC 0.0 0.0 - 0.2 %    Comment: Performed at [facility], [address] [place], [place] [address]  Fibrinogen every 6 hours x 4 post-procedure     Status: Abnormal   Collection Time: 01/29/19  7:40 AM  Result Value Ref Range   Fibrinogen 146 (L) 210 - 475 mg/dL    Comment: Performed at [facility], [address] [place], [name] [address]  APTT     Status: Abnormal   Collection Time: 01/29/19  7:40 AM  Result Value Ref Range   aPTT 75 (H) 24 - 36 seconds    Comment:  IF BASELINE aPTT IS ELEVATED, SUGGEST PATIENT RISK ASSESSMENT BE USED TO DETERMINE APPROPRIATE ANTICOAGULANT THERAPY. Performed at [facility], [address] [place], [name] [address]   APTT     Status: Abnormal   Collection Time: 01/29/19  3:04 PM  Result Value Ref Range   aPTT 125 (H) 24 - 36 seconds    Comment:         IF BASELINE aPTT IS ELEVATED, SUGGEST PATIENT RISK ASSESSMENT BE USED TO DETERMINE APPROPRIATE ANTICOAGULANT THERAPY. Performed at [facility], [address] [place], [place] [address]   CBC     Status: Abnormal   Collection Time: 01/30/19  3:50 AM  Result Value Ref Range   WBC 8.0 4.0 - 10.5 K/uL   RBC 2.41 (L) 4.22 - 5.81 MIL/uL   Hemoglobin 7.1 (L) 13.0 - 17.0 g/dL   HCT 21.9 (L) 39.0 - 52.0 %   MCV 90.9 80.0 - 100.0 fL   MCH 29.5 26.0 - 34.0 pg   MCHC 32.4 30.0 - 36.0 g/dL   RDW 14.6 11.5 - 15.5 %   Platelets 295 150 - 400 K/uL   nRBC 0.0 0.0 - 0.2 %    Comment: Performed at [facility], [address] [place], [place] [id number]  Basic metabolic panel     Status: Abnormal   Collection Time: 01/30/19  3:50 AM  Result Value Ref Range   Sodium 136 135 - 145 mmol/L   Potassium 4.8 3.5 - 5.1 mmol/L   Chloride 106 98 - 111 mmol/L   CO2 23 22 - 32 mmol/L   Glucose, Bld 113 (H) 70 - 99 mg/dL   BUN 24 (H) 8 - 23 mg/dL   Creatinine, Ser 1.71 (H) 0.61 - 1.24 mg/dL   Calcium 8.5 (L) 8.9 - 10.3 mg/dL   GFR calc non Af Amer 42 (L) >60 mL/min   GFR calc Af Amer 49 (L) >60 mL/min   Anion gap 7 5 - 15    Comment: Performed at [facility], [address], [place], [place] [address]  Magnesium     Status: None   Collection Time: 01/30/19  3:50 AM  Result Value Ref Range   Magnesium 2.1 1.7 - 2.4 mg/dL    Comment: Performed at [facility], [address] [place], [place] [address]  Hemoglobin     Status: Abnormal   Collection Time: 01/30/19  1:53 PM  Result Value Ref Range   Hemoglobin 7.9 (L) 13.0 - 17.0 g/dL    Comment: Performed at [facility], [address] [place],  [address]      PHQ2/9: Depression screen [facility] 2/9 04/09/2019 02/10/2019 11/20/2018 10/21/2018 06/22/2018  Decreased Interest 0 0 0 0 0  Down, Depressed, Hopeless 0 0 0 0 0  PHQ - 2 Score 0 0 0 0 0  Altered sleeping 0 0 0 0 1  Tired, decreased  energy 0 0 0 0 0  Change in appetite 0 0 0 0 0  Feeling bad or failure about yourself  0 0 0 0 0  Trouble concentrating 0 0 0 0 0  Moving slowly or fidgety/restless 0 0 0 0 0  Suicidal thoughts 0 0 0 0 0  PHQ-9 Score 0 0 0 0 1  Difficult doing work/chores - Not difficult at all - - Not difficult at all  Some recent data might be hidden    phq 9 is negative   Fall Risk: Fall Risk  02/10/2019 11/20/2018 10/21/2018 07/06/2018 06/22/2018  Falls in the past year? 0 0 0 0 0  Number falls in past yr: 0 0 0 0 0  Injury with Fall? 0 0 0 0 0     Functional Status Survey: Is the patient deaf or have difficulty hearing?: No Does the patient have difficulty seeing, even when wearing glasses/contacts?: No Does the patient have difficulty concentrating, remembering, or making decisions?: No Does the patient have difficulty walking or climbing stairs?: Yes Does the patient have difficulty dressing or bathing?: No Does the patient have difficulty doing errands alone such as visiting a doctor's office or shopping?: No    Assessment & Plan  1. Left lumbar radiculopathy  - pregabalin (LYRICA) 75 MG capsule; Take 1 capsule (75 mg total) by mouth 2 (two) times daily.  Dispense: 60 capsule; Refill: 0 She has an appointment with Dr. Phyllis Ginger next week   2. Need for immunization against influenza  - Flu Vaccine QUAD 36+ mos IM

## 2019-04-14 ENCOUNTER — Other Ambulatory Visit: Payer: Self-pay | Admitting: Physical Medicine and Rehabilitation

## 2019-04-14 DIAGNOSIS — M5442 Lumbago with sciatica, left side: Secondary | ICD-10-CM | POA: Diagnosis not present

## 2019-04-14 DIAGNOSIS — M5136 Other intervertebral disc degeneration, lumbar region: Secondary | ICD-10-CM | POA: Diagnosis not present

## 2019-04-14 DIAGNOSIS — M5416 Radiculopathy, lumbar region: Secondary | ICD-10-CM

## 2019-04-26 ENCOUNTER — Other Ambulatory Visit: Payer: Self-pay

## 2019-04-26 ENCOUNTER — Ambulatory Visit
Admission: RE | Admit: 2019-04-26 | Discharge: 2019-04-26 | Disposition: A | Discharge status: Home / Self Care | Payer: BC Managed Care – PPO | Source: Ambulatory Visit | Attending: Physical Medicine and Rehabilitation | Admitting: Physical Medicine and Rehabilitation

## 2019-04-26 DIAGNOSIS — M5416 Radiculopathy, lumbar region: Secondary | ICD-10-CM

## 2019-04-26 DIAGNOSIS — M5126 Other intervertebral disc displacement, lumbar region: Secondary | ICD-10-CM | POA: Diagnosis not present

## 2019-04-28 DIAGNOSIS — M5442 Lumbago with sciatica, left side: Secondary | ICD-10-CM | POA: Diagnosis not present

## 2019-04-28 DIAGNOSIS — M5126 Other intervertebral disc displacement, lumbar region: Secondary | ICD-10-CM | POA: Diagnosis not present

## 2019-04-28 DIAGNOSIS — M5416 Radiculopathy, lumbar region: Secondary | ICD-10-CM | POA: Diagnosis not present

## 2019-04-28 DIAGNOSIS — M5136 Other intervertebral disc degeneration, lumbar region: Secondary | ICD-10-CM | POA: Diagnosis not present

## 2019-04-29 ENCOUNTER — Other Ambulatory Visit: Payer: Self-pay | Admitting: Physical Medicine and Rehabilitation

## 2019-04-29 DIAGNOSIS — M5136 Other intervertebral disc degeneration, lumbar region: Secondary | ICD-10-CM

## 2019-04-30 ENCOUNTER — Telehealth: Payer: Self-pay

## 2019-04-30 NOTE — Telephone Encounter (Signed)
Copied from [place] [phone]. Topic: General - Other >> Apr 30, 2019 12:09 PM Pauline Good wrote: Reason for CRM: pt stating fax is coming through from [facility] to sign off for injection. Pt sign and send back

## 2019-05-06 ENCOUNTER — Other Ambulatory Visit: Payer: BC Managed Care – PPO

## 2019-05-07 ENCOUNTER — Other Ambulatory Visit: Payer: Self-pay

## 2019-05-07 DIAGNOSIS — Z20822 Contact with and (suspected) exposure to covid-19: Secondary | ICD-10-CM

## 2019-05-10 ENCOUNTER — Other Ambulatory Visit: Payer: Self-pay

## 2019-05-10 ENCOUNTER — Other Ambulatory Visit: Payer: Self-pay | Admitting: Physical Medicine and Rehabilitation

## 2019-05-10 ENCOUNTER — Ambulatory Visit
Admission: RE | Admit: 2019-05-10 | Discharge: 2019-05-10 | Disposition: A | Discharge status: Home / Self Care | Payer: BC Managed Care – PPO | Source: Ambulatory Visit | Attending: Physical Medicine and Rehabilitation | Admitting: Physical Medicine and Rehabilitation

## 2019-05-10 DIAGNOSIS — M79662 Pain in left lower leg: Secondary | ICD-10-CM | POA: Diagnosis not present

## 2019-05-10 DIAGNOSIS — M5136 Other intervertebral disc degeneration, lumbar region: Secondary | ICD-10-CM

## 2019-05-10 LAB — NOVEL CORONAVIRUS, NAA: SARS-CoV-2, NAA: NOT DETECTED

## 2019-05-10 MED ORDER — METHYLPREDNISOLONE ACETATE 40 MG/ML INJ SUSP (RADIOLOG
120.0000 mg | Freq: Once | INTRAMUSCULAR | Status: AC
  Administered 2019-05-10: 120 mg via EPIDURAL

## 2019-05-10 MED ORDER — IOPAMIDOL (ISOVUE-M 200) INJECTION 41%
1.0000 mL | Freq: Once | INTRAMUSCULAR | Status: AC
  Administered 2019-05-10: 1 mL via EPIDURAL

## 2019-05-10 NOTE — Discharge Instructions (Signed)

## 2019-05-12 ENCOUNTER — Encounter: Payer: Self-pay | Admitting: Pharmacist

## 2019-05-12 ENCOUNTER — Ambulatory Visit: Payer: Self-pay | Admitting: Pharmacist

## 2019-05-12 NOTE — Progress Notes (Signed)
This encounter was created in error - please disregard.

## 2019-05-12 NOTE — Chronic Care Management (AMB) (Signed)
  Care Management   Note  05/12/2019 Name: Ryan [name] MRN: [id number] DOB: [birth year]  Ryan [name] is a 63 y.o. year old male who sees Steele Sizer, MD for primary care. Dr. Ancil Boozer asked the CCM pharmacist to consult the patient for assistance with chronic disease management related to medication adherence. Referral was placed 02/10/19. Telephone outreach to patient today to introduce care management services.   Plan:   [name] was given information about Care Management services today including:  1. Case Management services includes personalized support from designated clinical staff supervised by his physician, including individualized plan of care and coordination with other care providers 2. 24/7 contact phone numbers for assistance for urgent and routine care needs. 3. The patient may stop case management services at any time by phone call to the office staff.  Patient did not agree to services and wishes to consider information provided before deciding about enrollment in care management services.    Ryan [name], PharmD Clinical Pharmacist [facility] [phone]

## 2019-05-24 ENCOUNTER — Other Ambulatory Visit: Payer: Self-pay | Admitting: Family Medicine

## 2019-05-24 DIAGNOSIS — I10 Essential (primary) hypertension: Secondary | ICD-10-CM

## 2019-06-17 DIAGNOSIS — I998 Other disorder of circulatory system: Secondary | ICD-10-CM | POA: Diagnosis not present

## 2019-06-17 DIAGNOSIS — M79604 Pain in right leg: Secondary | ICD-10-CM | POA: Diagnosis not present

## 2019-06-17 DIAGNOSIS — T82898A Other specified complication of vascular prosthetic devices, implants and grafts, initial encounter: Secondary | ICD-10-CM | POA: Diagnosis not present

## 2019-06-17 DIAGNOSIS — I739 Peripheral vascular disease, unspecified: Secondary | ICD-10-CM | POA: Diagnosis not present

## 2019-06-19 DIAGNOSIS — T82898A Other specified complication of vascular prosthetic devices, implants and grafts, initial encounter: Secondary | ICD-10-CM | POA: Diagnosis not present

## 2019-06-20 DIAGNOSIS — T82898A Other specified complication of vascular prosthetic devices, implants and grafts, initial encounter: Secondary | ICD-10-CM | POA: Diagnosis not present

## 2019-06-20 MED ORDER — GENERIC EXTERNAL MEDICATION
Status: DC
Start: ? — End: 2019-06-20

## 2019-06-20 MED ORDER — APIXABAN 5 MG PO TABS
5.00 | ORAL_TABLET | ORAL | Status: DC
Start: 2019-06-20 — End: 2019-06-20

## 2019-06-20 MED ORDER — ACETAMINOPHEN 325 MG PO TABS
650.00 | ORAL_TABLET | ORAL | Status: DC
Start: 2019-06-20 — End: 2019-06-20

## 2019-06-20 MED ORDER — LABETALOL HCL 5 MG/ML IV SOLN
10.00 | INTRAVENOUS | Status: DC
Start: ? — End: 2019-06-20

## 2019-06-20 MED ORDER — LIDOCAINE 5 % EX PTCH
1.00 | MEDICATED_PATCH | CUTANEOUS | Status: DC
Start: 2019-06-21 — End: 2019-06-20

## 2019-06-20 MED ORDER — ATORVASTATIN CALCIUM 40 MG PO TABS
40.00 | ORAL_TABLET | ORAL | Status: DC
Start: 2019-06-21 — End: 2019-06-20

## 2019-06-20 MED ORDER — MELATONIN 3 MG PO TABS
3.00 | ORAL_TABLET | ORAL | Status: DC
Start: ? — End: 2019-06-20

## 2019-06-20 MED ORDER — ASPIRIN 81 MG PO CHEW
81.00 | CHEWABLE_TABLET | ORAL | Status: DC
Start: 2019-06-21 — End: 2019-06-20

## 2019-06-20 MED ORDER — ONDANSETRON HCL 4 MG/2ML IJ SOLN
4.00 | INTRAMUSCULAR | Status: DC
Start: ? — End: 2019-06-20

## 2019-06-20 MED ORDER — HYDRALAZINE HCL 20 MG/ML IJ SOLN
10.00 | INTRAMUSCULAR | Status: DC
Start: ? — End: 2019-06-20

## 2019-06-20 MED ORDER — AMLODIPINE BESYLATE 10 MG PO TABS
10.00 | ORAL_TABLET | ORAL | Status: DC
Start: ? — End: 2019-06-20

## 2019-06-20 MED ORDER — ONDANSETRON 4 MG PO TBDP
4.00 | ORAL_TABLET | ORAL | Status: DC
Start: ? — End: 2019-06-20

## 2019-06-29 ENCOUNTER — Encounter: Payer: Self-pay | Admitting: Family Medicine

## 2019-06-29 ENCOUNTER — Ambulatory Visit (INDEPENDENT_AMBULATORY_CARE_PROVIDER_SITE_OTHER): Payer: BC Managed Care – PPO | Admitting: Family Medicine

## 2019-06-29 VITALS — Ht 68.0 in | Wt 130.0 lb

## 2019-06-29 DIAGNOSIS — I739 Peripheral vascular disease, unspecified: Secondary | ICD-10-CM

## 2019-06-29 DIAGNOSIS — D649 Anemia, unspecified: Secondary | ICD-10-CM

## 2019-06-29 DIAGNOSIS — I7789 Other specified disorders of arteries and arterioles: Secondary | ICD-10-CM

## 2019-06-29 DIAGNOSIS — F1020 Alcohol dependence, uncomplicated: Secondary | ICD-10-CM

## 2019-06-29 DIAGNOSIS — J438 Other emphysema: Secondary | ICD-10-CM

## 2019-06-29 DIAGNOSIS — I10 Essential (primary) hypertension: Secondary | ICD-10-CM

## 2019-06-29 DIAGNOSIS — E519 Thiamine deficiency, unspecified: Secondary | ICD-10-CM

## 2019-06-29 DIAGNOSIS — IMO0001 Reserved for inherently not codable concepts without codable children: Secondary | ICD-10-CM

## 2019-06-29 MED ORDER — AMLODIPINE BESYLATE 2.5 MG PO TABS: 2.5000 mg | ORAL_TABLET | Freq: Every day | ORAL | 0 refills | Status: DC

## 2019-06-29 MED ORDER — VITAMIN B-1 50 MG PO TABS: 50.0000 mg | ORAL_TABLET | Freq: Every day | ORAL | 2 refills | Status: DC

## 2019-06-29 NOTE — Progress Notes (Signed)
Name: Ryan [name]   MRN: [id number]    DOB: Apr 10, 1956   Date:06/29/2019       Progress Note  Subjective  Chief Complaint  Chief Complaint  Patient presents with  . Medication Refill  . COPD  . Hypertension    Denies any symptoms BP was up and down in hospital  . PAD  . Atherosclerosis of Aorta  . Foot Pain    I connected with  [name] on 06/29/19 at  3:40 PM EST by telephone and verified that I am speaking with the correct person using two identifiers.  I discussed the limitations, risks, security and privacy concerns of performing an evaluation and management service by telephone and the availability of in person appointments. Staff also discussed with the patient that there may be a patient responsible charge related to this service. Patient Location: at home Provider Location: [facility]   HPI  HTN: bp not checked, virtual visit and no vital signs , he read all his medications and norvasc not on the list, he states while in the hospital bp was up and down. No chest pain or palpitation   Atherosclerosis of Aorta: he is taking aspirin, Eliquis and statin therapy daily.   History of acute on chronic right lower extremity ischemia, admitted to [facility] on 06/17/2019 and discharged on 06/20/2019, he states he is still having pain on right foot , taking pain medications. He states pain worse with activity and better with rest. He is still taking oxycone and gabapentin, advised to call him back. He states he does not want to go back because they said unable to do a bypass and he needs an amputation, but he does not want to have an amputation . He used to see Dr. Lucky Cowboy but he stopped going there because lack of communication. He would like to go to [facility] for second opinion   Alcoholism: he is still drinking beer most days but not as much, usually 2 during week day and 4 on weekends, not taking B1 supplements anymore.   Emphysema: he states doing well on  Trelegy, very mild dry cough, sob only with activity no wheezing. Quit smoking July 2020   Patient Active Problem List   Diagnosis Date Noted  . Ischemia of right lower extremity 01/28/2019  . Iron deficiency anemia 11/23/2018  . Stage 3 chronic kidney disease 10/21/2018  . Atherosclerosis of native arteries of extremity with rest pain ([place]) 10/13/2018  . PAD (peripheral artery disease) ([place]) 07/21/2018  . Ischemic leg 06/25/2018  . Claudication ([place]) 03/17/2018  . B12 deficiency 03/12/2018  . Vitamin B1 deficiency 03/12/2018  . Varicose veins of leg with pain, right 03/12/2018  . Enlarged thoracic aorta ([place]) 02/24/2018  . Alcoholism /alcohol abuse ([place]) 02/24/2018  . Paresthesia 02/24/2018  . Ectatic aorta ([place]) 02/20/2018  . Emphysema lung ([place]) 02/20/2018  . Atherosclerosis of aorta ([place]) 02/20/2018  . Encounter for tobacco use cessation counseling   . Benign neoplasm of cecum   . Benign neoplasm of transverse colon   . Benign neoplasm of sigmoid colon   . Hypertension, benign 08/31/2015  . Tobacco use 08/31/2015    Past Surgical History:  Procedure Laterality Date  . COLONOSCOPY WITH PROPOFOL N/A 02/15/2016   Procedure: COLONOSCOPY WITH PROPOFOL;  Surgeon: Lucilla Lame, MD;  Location: [place];  Service: Endoscopy;  Laterality: N/A;  . HERNIA REPAIR  1999   left inguinal  . LOWER EXTREMITY ANGIOGRAPHY Right 05/07/2018  Procedure: LOWER EXTREMITY ANGIOGRAPHY;  Surgeon: Algernon Huxley, MD;  Location: [place] [facility];  Service: Cardiovascular;  Laterality: Right;  . LOWER EXTREMITY ANGIOGRAPHY Right 06/25/2018   Procedure: LOWER EXTREMITY ANGIOGRAPHY;  Surgeon: Algernon Huxley, MD;  Location: [place] [facility];  Service: Cardiovascular;  Laterality: Right;  . LOWER EXTREMITY ANGIOGRAPHY Right 07/01/2018   Procedure: LOWER EXTREMITY ANGIOGRAPHY;  Surgeon: Algernon Huxley, MD;  Location: [place] [facility];  Service: Cardiovascular;  Laterality: Right;  . LOWER  EXTREMITY ANGIOGRAPHY Right 08/12/2018   Procedure: LOWER EXTREMITY ANGIOGRAPHY;  Surgeon: Algernon Huxley, MD;  Location: [place] [facility];  Service: Cardiovascular;  Laterality: Right;  . LOWER EXTREMITY ANGIOGRAPHY Right 09/03/2018   Procedure: LOWER EXTREMITY ANGIOGRAPHY;  Surgeon: Algernon Huxley, MD;  Location: [place] [facility];  Service: Cardiovascular;  Laterality: Right;  . LOWER EXTREMITY ANGIOGRAPHY Right 09/04/2018   Procedure: Lower Extremity Angiography;  Surgeon: Algernon Huxley, MD;  Location: [place] [facility];  Service: Cardiovascular;  Laterality: Right;  . LOWER EXTREMITY ANGIOGRAPHY Right 10/01/2018   Procedure: LOWER EXTREMITY ANGIOGRAPHY;  Surgeon: Algernon Huxley, MD;  Location: [place] [facility];  Service: Cardiovascular;  Laterality: Right;  . LOWER EXTREMITY ANGIOGRAPHY Right 10/01/2018   Procedure: Lower Extremity Angiography;  Surgeon: Algernon Huxley, MD;  Location: [place] [facility];  Service: Cardiovascular;  Laterality: Right;  . LOWER EXTREMITY ANGIOGRAPHY Right 10/15/2018   Procedure: LOWER EXTREMITY ANGIOGRAPHY;  Surgeon: Algernon Huxley, MD;  Location: [place] [facility];  Service: Cardiovascular;  Laterality: Right;  . LOWER EXTREMITY ANGIOGRAPHY Left 10/16/2018   Procedure: Lower Extremity Angiography;  Surgeon: Algernon Huxley, MD;  Location: [place] [facility];  Service: Cardiovascular;  Laterality: Left;  . LOWER EXTREMITY ANGIOGRAPHY Left 01/20/2019   Procedure: LOWER EXTREMITY ANGIOGRAPHY;  Surgeon: Algernon Huxley, MD;  Location: [place] [facility];  Service: Cardiovascular;  Laterality: Left;  . LOWER EXTREMITY ANGIOGRAPHY Right 01/21/2019   Procedure: Lower Extremity Angiography;  Surgeon: Algernon Huxley, MD;  Location: [place] [facility];  Service: Cardiovascular;  Laterality: Right;  . LOWER EXTREMITY ANGIOGRAPHY Right 01/28/2019   Procedure: LOWER EXTREMITY ANGIOGRAPHY;  Surgeon: Algernon Huxley, MD;  Location: [place] [facility];  Service: Cardiovascular;   Laterality: Right;  . LOWER EXTREMITY ANGIOGRAPHY Right 01/29/2019   Procedure: Lower Extremity Angiography;  Surgeon: Algernon Huxley, MD;  Location: [place] [facility];  Service: Cardiovascular;  Laterality: Right;  . LOWER EXTREMITY INTERVENTION Right 07/02/2018   Procedure: LOWER EXTREMITY INTERVENTION;  Surgeon: Algernon Huxley, MD;  Location: [place] [facility];  Service: Cardiovascular;  Laterality: Right;  . POLYPECTOMY N/A 02/15/2016   Procedure: POLYPECTOMY;  Surgeon: Lucilla Lame, MD;  Location: [place] [facility];  Service: Endoscopy;  Laterality: N/A;    Family History  Problem Relation Age of Onset  . COPD Mother   . Hypertension Father   . Heart attack Brother     Social History   Socioeconomic History  . Marital status: Legally Separated    Spouse name: [name]  . Number of children: 5  . Years of education: Not on file  . Highest education level: High school graduate  Occupational History  . Occupation: Forklift    Comment: [name]  Tobacco Use  . Smoking status: Former Smoker    Packs/day: 0.50    Years: 40.00    Pack years: 20.00    Types: Cigarettes    Start  date: 07/21/1978    Quit date: 12/2018    Years since quitting: 0.5  . Smokeless tobacco: Never Used  Substance and Sexual Activity  . Alcohol use: Yes    Alcohol/week: 12.0 standard drinks    Types: 12 Cans of beer per week  . Drug use: Never  . Sexual activity: Yes    Partners: Female    Birth control/protection: None  Other Topics Concern  . Not on file  Social History Narrative  . Not on file   Social Determinants of Health   Financial Resource Strain:   . Difficulty of Paying Living Expenses: Not on file  Food Insecurity:   . Worried About Charity fundraiser in the Last Year: Not on file  . Ran Out of Food in the Last Year: Not on file  Transportation Needs:   . Lack of Transportation (Medical): Not on file  . Lack of Transportation (Non-Medical): Not on file  Physical Activity:     . Days of Exercise per Week: Not on file  . Minutes of Exercise per Session: Not on file  Stress:   . Feeling of Stress : Not on file  Social Connections:   . Frequency of Communication with Friends and Family: Not on file  . Frequency of Social Gatherings with Friends and Family: Not on file  . Attends Religious Services: Not on file  . Active Member of Clubs or Organizations: Not on file  . Attends Archivist Meetings: Not on file  . Marital Status: Not on file  Intimate Partner Violence:   . Fear of Current or Ex-Partner: Not on file  . Emotionally Abused: Not on file  . Physically Abused: Not on file  . Sexually Abused: Not on file     Current Outpatient Medications:  .  acetaminophen (TYLENOL) 500 MG tablet, Take 1 tablet (500 mg total) by mouth 2 (two) times daily., Disp: 100 tablet, Rfl: 0 .  amLODipine (NORVASC) 2.5 MG tablet, Take 1 tablet (2.5 mg total) by mouth daily., Disp: 90 tablet, Rfl: 0 .  apixaban (ELIQUIS) 5 MG TABS tablet, Take 1 tablet (5 mg total) by mouth 2 (two) times daily., Disp: 180 tablet, Rfl: 3 .  aspirin EC 81 MG tablet, Take 1 tablet (81 mg total) by mouth daily., Disp: 90 tablet, Rfl: 3 .  atorvastatin (LIPITOR) 20 MG tablet, Take 1 tablet (20 mg total) by mouth daily., Disp: 90 tablet, Rfl: 1 .  Cyanocobalamin (VITAMIN B-12) 1000 MCG SUBL, Place 1 tablet (1,000 mcg total) under the tongue daily., Disp: 90 tablet, Rfl: 0 .  Fluticasone-Umeclidin-Vilant (TRELEGY ELLIPTA) 100-62.5-25 MCG/INH AEPB, Inhale 1 puff into the lungs daily., Disp: 60 each, Rfl: 2 .  gabapentin (NEURONTIN) 100 MG capsule, 1po qHS x 4 days, then bid as tolerated not to take on workdays, Disp: , Rfl:  .  lidocaine (LIDODERM) 5 %, Place 1 patch onto the skin daily. Remove & Discard patch within 12 hours or as directed by MD, Disp: 30 patch, Rfl: 0 .  lisinopril (ZESTRIL) 20 MG tablet, Take 1 tablet by mouth once daily, Disp: 90 tablet, Rfl: 0 .  Oxycodone HCl 10 MG TABS,  Take 10 mg by mouth 4 (four) times daily as needed., Disp: , Rfl:  .  thiamine (VITAMIN B-1) 50 MG tablet, Take 1 tablet (50 mg total) by mouth daily., Disp: 30 tablet, Rfl: 2  No Known Allergies  I personally reviewed active problem list, medication list, allergies, family history,  social history, health maintenance with the patient/caregiver today.   ROS  Ten systems reviewed and is negative except as mentioned in HPI   Objective  Virtual encounter, vitals not obtained.  Body mass index is 19.77 kg/m.  Physical Exam  Awake, alert and oriented   PHQ2/9: Depression screen [facility] 2/9 06/29/2019 04/09/2019 02/10/2019 11/20/2018 10/21/2018  Decreased Interest 0 0 0 0 0  Down, Depressed, Hopeless 0 0 0 0 0  PHQ - 2 Score 0 0 0 0 0  Altered sleeping 0 0 0 0 0  Tired, decreased energy 0 0 0 0 0  Change in appetite 0 0 0 0 0  Feeling bad or failure about yourself  0 0 0 0 0  Trouble concentrating 0 0 0 0 0  Moving slowly or fidgety/restless 0 0 0 0 0  Suicidal thoughts 0 0 0 0 0  PHQ-9 Score 0 0 0 0 0  Difficult doing work/chores Not difficult at all - Not difficult at all - -  Some recent data might be hidden   PHQ-2/9 Result is negative.    Fall Risk: Fall Risk  06/29/2019 02/10/2019 11/20/2018 10/21/2018 07/06/2018  Falls in the past year? 0 0 0 0 0  Number falls in past yr: 0 0 0 0 0  Injury with Fall? 0 0 0 0 0     Assessment & Plan  1. PAD (peripheral artery disease) ([place])  - Ambulatory referral to Vascular Surgery  2. Hypertension, benign  Advised to monitor for dizziness - amLODipine (NORVASC) 2.5 MG tablet; Take 1 tablet (2.5 mg total) by mouth daily.  Dispense: 90 tablet; Refill: 0  3. Other emphysema ([place])  Doing well on Trelegy  4. Alcoholism /alcohol abuse ([facility])  - thiamine (VITAMIN B-1) 50 MG tablet; Take 1 tablet (50 mg total) by mouth daily.  Dispense: 30 tablet; Refill: 2  5. Enlarged thoracic aorta ([place])   6. Vitamin B1 deficiency  - thiamine  (VITAMIN B-1) 50 MG tablet; Take 1 tablet (50 mg total) by mouth daily.  Dispense: 30 tablet; Refill: 2  7. Anemia, unspecified type  Hemoglobin was down to 9.4 on discharge, discussed rechecking but he states he wants to hold off   I discussed the assessment and treatment plan with the patient. The patient was provided an opportunity to ask questions and all were answered. The patient agreed with the plan and demonstrated an understanding of the instructions.   The patient was advised to call back or seek an in-person evaluation if the symptoms worsen or if the condition fails to improve as anticipated.  I provided 25 minutes of non-face-to-face time during this encounter.  Loistine Chance, MD

## 2019-07-18 ENCOUNTER — Emergency Department: Payer: Self-pay

## 2019-07-18 ENCOUNTER — Other Ambulatory Visit: Payer: Self-pay

## 2019-07-18 ENCOUNTER — Emergency Department
Admission: EM | Admit: 2019-07-18 | Discharge: 2019-07-19 | Disposition: A | Discharge status: Home / Self Care | Payer: Self-pay | Attending: Student | Admitting: Student

## 2019-07-18 DIAGNOSIS — R5381 Other malaise: Secondary | ICD-10-CM

## 2019-07-18 DIAGNOSIS — Z7982 Long term (current) use of aspirin: Secondary | ICD-10-CM | POA: Diagnosis not present

## 2019-07-18 DIAGNOSIS — Z87891 Personal history of nicotine dependence: Secondary | ICD-10-CM | POA: Diagnosis not present

## 2019-07-18 DIAGNOSIS — Z20822 Contact with and (suspected) exposure to covid-19: Secondary | ICD-10-CM

## 2019-07-18 DIAGNOSIS — R05 Cough: Secondary | ICD-10-CM

## 2019-07-18 DIAGNOSIS — N183 Chronic kidney disease, stage 3 unspecified: Secondary | ICD-10-CM | POA: Diagnosis not present

## 2019-07-18 DIAGNOSIS — R059 Cough, unspecified: Secondary | ICD-10-CM

## 2019-07-18 DIAGNOSIS — I129 Hypertensive chronic kidney disease with stage 1 through stage 4 chronic kidney disease, or unspecified chronic kidney disease: Secondary | ICD-10-CM | POA: Diagnosis not present

## 2019-07-18 DIAGNOSIS — U071 COVID-19: Secondary | ICD-10-CM | POA: Diagnosis not present

## 2019-07-18 DIAGNOSIS — Z7901 Long term (current) use of anticoagulants: Secondary | ICD-10-CM | POA: Insufficient documentation

## 2019-07-18 DIAGNOSIS — I2510 Atherosclerotic heart disease of native coronary artery without angina pectoris: Secondary | ICD-10-CM | POA: Insufficient documentation

## 2019-07-18 DIAGNOSIS — Z79899 Other long term (current) drug therapy: Secondary | ICD-10-CM | POA: Insufficient documentation

## 2019-07-18 DIAGNOSIS — R5383 Other fatigue: Secondary | ICD-10-CM

## 2019-07-18 DIAGNOSIS — R112 Nausea with vomiting, unspecified: Secondary | ICD-10-CM | POA: Diagnosis not present

## 2019-07-18 DIAGNOSIS — R110 Nausea: Secondary | ICD-10-CM

## 2019-07-18 LAB — CBC
HCT: 33 % — ABNORMAL LOW (ref 39.0–52.0)
Hemoglobin: 11 g/dL — ABNORMAL LOW (ref 13.0–17.0)
MCH: 28.3 pg (ref 26.0–34.0)
MCHC: 33.3 g/dL (ref 30.0–36.0)
MCV: 84.8 fL (ref 80.0–100.0)
Platelets: 219 10*3/uL (ref 150–400)
RBC: 3.89 MIL/uL — ABNORMAL LOW (ref 4.22–5.81)
RDW: 14.4 % (ref 11.5–15.5)
WBC: 4.8 10*3/uL (ref 4.0–10.5)
nRBC: 0 % (ref 0.0–0.2)

## 2019-07-18 LAB — COMPREHENSIVE METABOLIC PANEL
ALT: 23 U/L (ref 0–44)
AST: 26 U/L (ref 15–41)
Albumin: 4.2 g/dL (ref 3.5–5.0)
Alkaline Phosphatase: 64 U/L (ref 38–126)
Anion gap: 9 (ref 5–15)
BUN: 25 mg/dL — ABNORMAL HIGH (ref 8–23)
CO2: 22 mmol/L (ref 22–32)
Calcium: 9 mg/dL (ref 8.9–10.3)
Chloride: 102 mmol/L (ref 98–111)
Creatinine, Ser: 1.65 mg/dL — ABNORMAL HIGH (ref 0.61–1.24)
GFR calc Af Amer: 51 mL/min — ABNORMAL LOW (ref >60)
GFR calc non Af Amer: 44 mL/min — ABNORMAL LOW (ref >60)
Glucose, Bld: 112 mg/dL — ABNORMAL HIGH (ref 70–99)
Potassium: 4.1 mmol/L (ref 3.5–5.1)
Sodium: 133 mmol/L — ABNORMAL LOW (ref 135–145)
Total Bilirubin: 0.7 mg/dL (ref 0.3–1.2)
Total Protein: 7.8 g/dL (ref 6.5–8.1)

## 2019-07-18 LAB — POC SARS CORONAVIRUS 2 AG: SARS Coronavirus 2 Ag: NEGATIVE

## 2019-07-18 LAB — TROPONIN I (HIGH SENSITIVITY): Troponin I (High Sensitivity): 4 ng/L (ref <18)

## 2019-07-18 LAB — LIPASE, BLOOD: Lipase: 33 U/L (ref 11–51)

## 2019-07-18 LAB — POC SARS CORONAVIRUS 2 AG -  ED: SARS Coronavirus 2 Ag: NEGATIVE

## 2019-07-18 MED ORDER — ACETAMINOPHEN 325 MG PO TABS
ORAL_TABLET | ORAL | Status: AC
  Administered 2019-07-18: 650 mg via ORAL
  Filled 2019-07-18: qty 2

## 2019-07-18 MED ORDER — ACETAMINOPHEN 325 MG PO TABS: 650.0000 mg | ORAL_TABLET | Freq: Once | ORAL | Status: AC

## 2019-07-18 MED ORDER — SODIUM CHLORIDE 0.9 % IV BOLUS
1000.0000 mL | Freq: Once | INTRAVENOUS | Status: AC
  Administered 2019-07-18: 1000 mL via INTRAVENOUS

## 2019-07-18 NOTE — ED Provider Notes (Signed)
[facility] Emergency Department Provider Note  ____________________________________________   First MD Initiated Contact with Patient 07/18/19 2055     (approximate)  I have reviewed the triage vital signs and the nursing notes.  History  Chief Complaint Nausea    HPI Ryan [name] is a 64 y.o. male with history of HTN, HLD, CKD who presents emergency department for nausea and fatigue.  Patient states symptoms started today.  States he woke up this morning with a dry throat and some nausea.  Attempted to eat breakfast, but later had an episode of nonbloody, nonbilious emesis.  Reports associated fatigue.  Has been exposed to his daughter who recently tested positive for COVID on Thursday.  He denies any fevers, cough, difficulty breathing, chest pain.  Denies any abdominal pain or discomfort.   Past Medical Hx Past Medical History:  Diagnosis Date  . Hyperlipidemia   . Hypertension   . Neuromuscular disorder (HCC)    numbness feet  . Peripheral vascular disease ([place])   . Shortness of breath dyspnea     Problem List Patient Active Problem List   Diagnosis Date Noted  . Ischemia of right lower extremity 01/28/2019  . Iron deficiency anemia 11/23/2018  . Stage 3 chronic kidney disease 10/21/2018  . Atherosclerosis of native arteries of extremity with rest pain ([place]) 10/13/2018  . PAD (peripheral artery disease) ([place]) 07/21/2018  . Ischemic leg 06/25/2018  . Claudication ([place]) 03/17/2018  . B12 deficiency 03/12/2018  . Vitamin B1 deficiency 03/12/2018  . Varicose veins of leg with pain, right 03/12/2018  . Enlarged thoracic aorta ([place]) 02/24/2018  . Alcoholism /alcohol abuse ([place]) 02/24/2018  . Paresthesia 02/24/2018  . Ectatic aorta ([place]) 02/20/2018  . Emphysema lung ([place]) 02/20/2018  . Atherosclerosis of aorta ([place]) 02/20/2018  . Encounter for tobacco use cessation counseling   . Benign neoplasm of cecum   . Benign neoplasm of transverse  colon   . Benign neoplasm of sigmoid colon   . Hypertension, benign 08/31/2015  . Tobacco use 08/31/2015    Past Surgical Hx Past Surgical History:  Procedure Laterality Date  . COLONOSCOPY WITH PROPOFOL N/A 02/15/2016   Procedure: COLONOSCOPY WITH PROPOFOL;  Surgeon: Lucilla Lame, MD;  Location: [place];  Service: Endoscopy;  Laterality: N/A;  . HERNIA REPAIR  1999   left inguinal  . LOWER EXTREMITY ANGIOGRAPHY Right 05/07/2018   Procedure: LOWER EXTREMITY ANGIOGRAPHY;  Surgeon: Algernon Huxley, MD;  Location: [place] [facility];  Service: Cardiovascular;  Laterality: Right;  . LOWER EXTREMITY ANGIOGRAPHY Right 06/25/2018   Procedure: LOWER EXTREMITY ANGIOGRAPHY;  Surgeon: Algernon Huxley, MD;  Location: [place] [facility];  Service: Cardiovascular;  Laterality: Right;  . LOWER EXTREMITY ANGIOGRAPHY Right 07/01/2018   Procedure: LOWER EXTREMITY ANGIOGRAPHY;  Surgeon: Algernon Huxley, MD;  Location: [place] [facility];  Service: Cardiovascular;  Laterality: Right;  . LOWER EXTREMITY ANGIOGRAPHY Right 08/12/2018   Procedure: LOWER EXTREMITY ANGIOGRAPHY;  Surgeon: Algernon Huxley, MD;  Location: [place] [facility];  Service: Cardiovascular;  Laterality: Right;  . LOWER EXTREMITY ANGIOGRAPHY Right 09/03/2018   Procedure: LOWER EXTREMITY ANGIOGRAPHY;  Surgeon: Algernon Huxley, MD;  Location: [place] [facility];  Service: Cardiovascular;  Laterality: Right;  . LOWER EXTREMITY ANGIOGRAPHY Right 09/04/2018   Procedure: Lower Extremity Angiography;  Surgeon: Algernon Huxley, MD;  Location: [place] [facility];  Service: Cardiovascular;  Laterality: Right;  . LOWER EXTREMITY ANGIOGRAPHY Right 10/01/2018   Procedure: LOWER  EXTREMITY ANGIOGRAPHY;  Surgeon: Algernon Huxley, MD;  Location: [place] [facility];  Service: Cardiovascular;  Laterality: Right;  . LOWER EXTREMITY ANGIOGRAPHY Right 10/01/2018   Procedure: Lower Extremity Angiography;  Surgeon: Algernon Huxley, MD;  Location: [place] [facility];   Service: Cardiovascular;  Laterality: Right;  . LOWER EXTREMITY ANGIOGRAPHY Right 10/15/2018   Procedure: LOWER EXTREMITY ANGIOGRAPHY;  Surgeon: Algernon Huxley, MD;  Location: [place] [facility];  Service: Cardiovascular;  Laterality: Right;  . LOWER EXTREMITY ANGIOGRAPHY Left 10/16/2018   Procedure: Lower Extremity Angiography;  Surgeon: Algernon Huxley, MD;  Location: [place] [facility];  Service: Cardiovascular;  Laterality: Left;  . LOWER EXTREMITY ANGIOGRAPHY Left 01/20/2019   Procedure: LOWER EXTREMITY ANGIOGRAPHY;  Surgeon: Algernon Huxley, MD;  Location: [place] [facility];  Service: Cardiovascular;  Laterality: Left;  . LOWER EXTREMITY ANGIOGRAPHY Right 01/21/2019   Procedure: Lower Extremity Angiography;  Surgeon: Algernon Huxley, MD;  Location: [place] [facility];  Service: Cardiovascular;  Laterality: Right;  . LOWER EXTREMITY ANGIOGRAPHY Right 01/28/2019   Procedure: LOWER EXTREMITY ANGIOGRAPHY;  Surgeon: Algernon Huxley, MD;  Location: [place] [facility];  Service: Cardiovascular;  Laterality: Right;  . LOWER EXTREMITY ANGIOGRAPHY Right 01/29/2019   Procedure: Lower Extremity Angiography;  Surgeon: Algernon Huxley, MD;  Location: [place] [facility];  Service: Cardiovascular;  Laterality: Right;  . LOWER EXTREMITY INTERVENTION Right 07/02/2018   Procedure: LOWER EXTREMITY INTERVENTION;  Surgeon: Algernon Huxley, MD;  Location: [place] [facility];  Service: Cardiovascular;  Laterality: Right;  . POLYPECTOMY N/A 02/15/2016   Procedure: POLYPECTOMY;  Surgeon: Lucilla Lame, MD;  Location: [place];  Service: Endoscopy;  Laterality: N/A;    Medications Prior to Admission medications   Medication Sig Start Date End Date Taking? Authorizing Provider  acetaminophen (TYLENOL) 500 MG tablet Take 1 tablet (500 mg total) by mouth 2 (two) times daily. 02/10/19   Steele Sizer, MD  amLODipine (NORVASC) 2.5 MG tablet Take 1 tablet (2.5 mg total) by mouth daily. 06/29/19   Steele Sizer, MD  apixaban  (ELIQUIS) 5 MG TABS tablet Take 1 tablet (5 mg total) by mouth 2 (two) times daily. 10/19/18   Stegmayer, Janalyn Harder, PA-C  aspirin EC 81 MG tablet Take 1 tablet (81 mg total) by mouth daily. 08/13/18   Stegmayer, Joelene Millin A, PA-C  atorvastatin (LIPITOR) 20 MG tablet Take 1 tablet (20 mg total) by mouth daily. 02/10/19   Steele Sizer, MD  Cyanocobalamin (VITAMIN B-12) 1000 MCG SUBL Place 1 tablet (1,000 mcg total) under the tongue daily. 04/30/18   Steele Sizer, MD  Fluticasone-Umeclidin-Vilant (TRELEGY ELLIPTA) 100-62.5-25 MCG/INH AEPB Inhale 1 puff into the lungs daily. 11/20/18   Steele Sizer, MD  gabapentin (NEURONTIN) 100 MG capsule 1po qHS x 4 days, then bid as tolerated not to take on workdays 04/14/19   [provider]  lidocaine (LIDODERM) 5 % Place 1 patch onto the skin daily. Remove & Discard patch within 12 hours or as directed by MD 04/07/19   Laban Emperor, PA-C  lisinopril (ZESTRIL) 20 MG tablet Take 1 tablet by mouth once daily 05/24/19   Steele Sizer, MD  Oxycodone HCl 10 MG TABS Take 10 mg by mouth 4 (four) times daily as needed. 06/20/19   [provider]  thiamine (VITAMIN B-1) 50 MG tablet Take 1 tablet (50 mg total) by mouth daily. 06/29/19   Steele Sizer, MD    Allergies Patient has no known allergies.  Family Hx Family History  Problem Relation Age of Onset  . COPD Mother   . Hypertension Father   . Heart attack Brother     Social Hx Social History   Tobacco Use  . Smoking status: Former Smoker    Packs/day: 0.50    Years: 40.00    Pack years: 20.00    Types: Cigarettes    Start date: 07/21/1978    Quit date: 12/2018    Years since quitting: 0.5  . Smokeless tobacco: Never Used  Substance Use Topics  . Alcohol use: Yes    Alcohol/week: 12.0 standard drinks    Types: 12 Cans of beer per week  . Drug use: Never     Review of Systems  Constitutional: Negative for fever, chills.  Positive for fatigue. Eyes: Negative for  visual changes. ENT: Negative for sore throat. Cardiovascular: Negative for chest pain. Respiratory: Negative for shortness of breath. Gastrointestinal: Positive for nausea, vomiting.  Genitourinary: Negative for dysuria. Musculoskeletal: Negative for leg swelling. Skin: Negative for rash. Neurological: Negative for for headaches.   Physical Exam  Vital Signs: ED Triage Vitals  Enc Vitals Group     BP 07/18/19 2039 (!) 175/91     Pulse Rate 07/18/19 2039 (!) 115     Resp 07/18/19 2039 20     Temp 07/18/19 2039 98.8 F (37.1 C)     Temp Source 07/18/19 2039 Oral     SpO2 07/18/19 2039 97 %     Weight 07/18/19 2040 130 lb (59 kg)     Height 07/18/19 2040 5\' 8"  (1.727 m)     Head Circumference --      Peak Flow --      Pain Score 07/18/19 2040 0     Pain Loc --      Pain Edu? --      Excl. in [place]? --     Constitutional: Alert and oriented.  Head: Normocephalic. Atraumatic. Eyes: Conjunctivae clear. Sclera anicteric. Nose: No congestion. No rhinorrhea. Mouth/Throat: Wearing mask.  Neck: No stridor.   Cardiovascular: Tachycardic, regular rhythm. Extremities well perfused. Respiratory: Normal respiratory effort.  Lungs CTAB. Gastrointestinal: Soft. Non-tender throughout to deep palpation. Non-distended.  Musculoskeletal: No lower extremity edema. No deformities. Neurologic:  Normal speech and language. No gross focal neurologic deficits are appreciated.  Skin: Skin is warm, dry and intact. No rash noted. Psychiatric: Mood and affect are appropriate for situation.  EKG  Personally reviewed.   Rate: 121 Rhythm: sinus Axis: normal Intervals: WNL Sinus tachycardia No acute ischemic changes No STEMI    Radiology  CXR: IMPRESSION:  Calcified left pleural plaques and left basilar scarring. No active  disease.    Procedures  Procedure(s) performed (including critical care):  Procedures   Initial Impression / Assessment and Plan / ED Course  65 y.o. male  who presents to the ED for nausea, fatigue, recent COVID exposure.  Ddx: COVID, atypical ACS, electrolyte abnormality.  Doubt acute abdominal pathology given no associated abdominal pain, reassuring abdominal exam.  Will obtain labs, fluids, EKG, reassess.  Creatinine near baseline. EKG reveals sinus tachycardia, no acute ischemic changes.  POC COVID antigen negative, will send PCR testing.  Awaiting troponin, if negative, anticipate discharge given he is otherwise hemodynamically stable, he has had improvement in his HR after fluids, no evidence of respiratory distress or hypoxia.   Final Clinical Impression(s) / ED Diagnosis  Final diagnoses:  Nausea  Malaise  Suspected COVID-19 virus infection  Note:  This document was prepared using Dragon voice recognition software and may include unintentional dictation errors.   Lilia Pro., MD 07/18/19 [phone]

## 2019-07-18 NOTE — ED Triage Notes (Signed)
OF note, patient states his daughter tested positive on Thursday for [place] virus

## 2019-07-18 NOTE — ED Triage Notes (Signed)
States he woke up this morning and had dry throat and some nausea. Ate his breakfast but "that came back up on me." Denies [facility] or chest pain. States he just feels a little tired tonight. Patient able to speak in complete sentences without difficulty. Does cough occasionally while in triage.

## 2019-07-18 NOTE — Discharge Instructions (Addendum)
Thank you for letting us take care of you in the emergency department.   At this time, your coronavirus swab results are pending. You should hear your results in 1-2 days if they are positive.   In the meantime, it is important to take precautions in case you are positive. This includes quarantining, wearing a mask, and social distancing.   Stay well hydrated, eat a healthy diet, and continue taking your prescribed medications.  Please return to the ER for any new or worsening symptoms, such as difficulty breathing, vomiting and diarrhea, or chest pain.

## 2019-07-19 ENCOUNTER — Telehealth: Payer: Self-pay | Admitting: Emergency Medicine

## 2019-07-19 LAB — SARS CORONAVIRUS 2 (TAT 6-24 HRS): SARS Coronavirus 2: POSITIVE — AB

## 2019-07-19 MED ORDER — AZITHROMYCIN 500 MG PO TABS: 500.0000 mg | ORAL_TABLET | Freq: Every day | ORAL | 0 refills | Status: AC

## 2019-07-19 MED ORDER — ONDANSETRON 4 MG PO TBDP: 4.0000 mg | ORAL_TABLET | Freq: Three times a day (TID) | ORAL | 0 refills | Status: DC | PRN

## 2019-07-19 NOTE — Telephone Encounter (Signed)
Called patient to inform of positive covid 19 result.  No answer and voicemail is full.

## 2019-07-20 ENCOUNTER — Telehealth: Payer: Self-pay | Admitting: Nurse Practitioner

## 2019-07-20 NOTE — Telephone Encounter (Signed)
Called to Discuss with patient about Covid symptoms and the use of bamlanivimab, a monoclonal antibody infusion for those with mild to moderate Covid symptoms and at a high risk of hospitalization.     Pt is qualified for this infusion at the [facility] infusion center due to co-morbid conditions and/or a member of an at-risk group.     Patient Active Problem List   Diagnosis Date Noted  . Ischemia of right lower extremity 01/28/2019  . Iron deficiency anemia 11/23/2018  . Stage 3 chronic kidney disease 10/21/2018  . Atherosclerosis of native arteries of extremity with rest pain ([place]) 10/13/2018  . PAD (peripheral artery disease) ([place]) 07/21/2018  . Ischemic leg 06/25/2018  . Claudication ([place]) 03/17/2018  . B12 deficiency 03/12/2018  . Vitamin B1 deficiency 03/12/2018  . Varicose veins of leg with pain, right 03/12/2018  . Enlarged thoracic aorta ([place]) 02/24/2018  . Alcoholism /alcohol abuse ([place]) 02/24/2018  . Paresthesia 02/24/2018  . Ectatic aorta ([place]) 02/20/2018  . Emphysema lung ([place]) 02/20/2018  . Atherosclerosis of aorta ([place]) 02/20/2018  . Encounter for tobacco use cessation counseling   . Benign neoplasm of cecum   . Benign neoplasm of transverse colon   . Benign neoplasm of sigmoid colon   . Hypertension, benign 08/31/2015  . Tobacco use 08/31/2015    Patient states that he is not having any symptoms at this time. Symptoms tier reviewed as well as criteria for ending isolation. Preventative practices reviewed. Patient verbalized understanding.    Patient advised to call back if his symptoms return and he decides that he does want to get infusion. Callback number to the infusion center given. Patient advised to go to Urgent care or ED with severe symptoms.   Note: symptoms started on 07/18/19 - patient states that symptoms have cleared at this point

## 2019-08-05 ENCOUNTER — Telehealth (HOSPITAL_COMMUNITY): Payer: Self-pay

## 2019-08-05 NOTE — Telephone Encounter (Signed)

## 2019-08-06 ENCOUNTER — Encounter: Payer: Self-pay | Admitting: Vascular Surgery

## 2019-08-06 ENCOUNTER — Other Ambulatory Visit: Payer: Self-pay

## 2019-08-06 ENCOUNTER — Ambulatory Visit (INDEPENDENT_AMBULATORY_CARE_PROVIDER_SITE_OTHER): Payer: Self-pay | Admitting: Vascular Surgery

## 2019-08-06 VITALS — BP 198/102 | HR 88 | Temp 97.2°F | Resp 20 | Ht 68.0 in | Wt 141.0 lb

## 2019-08-06 DIAGNOSIS — I739 Peripheral vascular disease, unspecified: Secondary | ICD-10-CM

## 2019-08-06 NOTE — Progress Notes (Signed)
Patient ID: Ryan [name], male   DOB: 11/06/1955, 64 y.o.   MRN: [id number]  Reason for Consult: Follow-up   Referred by Steele Sizer, MD  Subjective:     HPI:  Ryan [name] is a 64 y.o. male multiple right lower extremity procedures that have been endovascular.  This includes stenting of his entire SFA and popliteal.  Most recently he underwent lysis with penumbra of the below-knee pop.  This was done in August and [name].  Subsequently had a similar procedure by interventional radiology at [facility].  This was in December.  Now states that he has pain in calf with walking at 25 yards.  Does not have any tissue loss or ulceration.  States that he quit smoking this summer.  Risk factors for vascular disease include hypertension, hyperlipidemia and previous smoking history.  Patient does take Eliquis.  He is also on aspirin and statin.  Past Medical History:  Diagnosis Date  . Hyperlipidemia   . Hypertension   . Neuromuscular disorder (HCC)    numbness feet  . Peripheral vascular disease ([place])   . Shortness of breath dyspnea    Family History  Problem Relation Age of Onset  . COPD Mother   . Hypertension Father   . Heart attack Brother    Past Surgical History:  Procedure Laterality Date  . COLONOSCOPY WITH PROPOFOL N/A 02/15/2016   Procedure: COLONOSCOPY WITH PROPOFOL;  Surgeon: Lucilla Lame, MD;  Location: [place];  Service: Endoscopy;  Laterality: N/A;  . HERNIA REPAIR  1999   left inguinal  . LOWER EXTREMITY ANGIOGRAPHY Right 05/07/2018   Procedure: LOWER EXTREMITY ANGIOGRAPHY;  Surgeon: Algernon Huxley, MD;  Location: [place] [facility];  Service: Cardiovascular;  Laterality: Right;  . LOWER EXTREMITY ANGIOGRAPHY Right 06/25/2018   Procedure: LOWER EXTREMITY ANGIOGRAPHY;  Surgeon: Algernon Huxley, MD;  Location: [place] [facility];  Service: Cardiovascular;  Laterality: Right;  . LOWER EXTREMITY ANGIOGRAPHY Right 07/01/2018   Procedure: LOWER EXTREMITY  ANGIOGRAPHY;  Surgeon: Algernon Huxley, MD;  Location: [place] [facility];  Service: Cardiovascular;  Laterality: Right;  . LOWER EXTREMITY ANGIOGRAPHY Right 08/12/2018   Procedure: LOWER EXTREMITY ANGIOGRAPHY;  Surgeon: Algernon Huxley, MD;  Location: [place] [facility];  Service: Cardiovascular;  Laterality: Right;  . LOWER EXTREMITY ANGIOGRAPHY Right 09/03/2018   Procedure: LOWER EXTREMITY ANGIOGRAPHY;  Surgeon: Algernon Huxley, MD;  Location: [place] [facility];  Service: Cardiovascular;  Laterality: Right;  . LOWER EXTREMITY ANGIOGRAPHY Right 09/04/2018   Procedure: Lower Extremity Angiography;  Surgeon: Algernon Huxley, MD;  Location: [place] [facility];  Service: Cardiovascular;  Laterality: Right;  . LOWER EXTREMITY ANGIOGRAPHY Right 10/01/2018   Procedure: LOWER EXTREMITY ANGIOGRAPHY;  Surgeon: Algernon Huxley, MD;  Location: [place] [facility];  Service: Cardiovascular;  Laterality: Right;  . LOWER EXTREMITY ANGIOGRAPHY Right 10/01/2018   Procedure: Lower Extremity Angiography;  Surgeon: Algernon Huxley, MD;  Location: [place] [facility];  Service: Cardiovascular;  Laterality: Right;  . LOWER EXTREMITY ANGIOGRAPHY Right 10/15/2018   Procedure: LOWER EXTREMITY ANGIOGRAPHY;  Surgeon: Algernon Huxley, MD;  Location: [place] [facility];  Service: Cardiovascular;  Laterality: Right;  . LOWER EXTREMITY ANGIOGRAPHY Left 10/16/2018   Procedure: Lower Extremity Angiography;  Surgeon: Algernon Huxley, MD;  Location: [place] [facility];  Service: Cardiovascular;  Laterality: Left;  . LOWER EXTREMITY ANGIOGRAPHY Left 01/20/2019   Procedure: LOWER EXTREMITY ANGIOGRAPHY;  Surgeon: Algernon Huxley, MD;  Location: [place] [facility];  Service: Cardiovascular;  Laterality: Left;  . LOWER EXTREMITY ANGIOGRAPHY Right 01/21/2019   Procedure: Lower Extremity Angiography;  Surgeon: Algernon Huxley, MD;  Location: [place] [facility];  Service: Cardiovascular;  Laterality: Right;  . LOWER EXTREMITY ANGIOGRAPHY Right 01/28/2019    Procedure: LOWER EXTREMITY ANGIOGRAPHY;  Surgeon: Algernon Huxley, MD;  Location: [place] [facility];  Service: Cardiovascular;  Laterality: Right;  . LOWER EXTREMITY ANGIOGRAPHY Right 01/29/2019   Procedure: Lower Extremity Angiography;  Surgeon: Algernon Huxley, MD;  Location: [place] [facility];  Service: Cardiovascular;  Laterality: Right;  . LOWER EXTREMITY INTERVENTION Right 07/02/2018   Procedure: LOWER EXTREMITY INTERVENTION;  Surgeon: Algernon Huxley, MD;  Location: [place] [facility];  Service: Cardiovascular;  Laterality: Right;  . POLYPECTOMY N/A 02/15/2016   Procedure: POLYPECTOMY;  Surgeon: Lucilla Lame, MD;  Location: [place];  Service: Endoscopy;  Laterality: N/A;    Short Social History:  Social History   Tobacco Use  . Smoking status: Former Smoker    Packs/day: 0.50    Years: 40.00    Pack years: 20.00    Types: Cigarettes    Start date: 07/21/1978    Quit date: 12/2018    Years since quitting: 0.6  . Smokeless tobacco: Never Used  Substance Use Topics  . Alcohol use: Yes    Alcohol/week: 12.0 standard drinks    Types: 12 Cans of beer per week    No Known Allergies  Current Outpatient Medications  Medication Sig Dispense Refill  . acetaminophen (TYLENOL) 500 MG tablet Take 1 tablet (500 mg total) by mouth 2 (two) times daily. 100 tablet 0  . amLODipine (NORVASC) 2.5 MG tablet Take 1 tablet (2.5 mg total) by mouth daily. 90 tablet 0  . apixaban (ELIQUIS) 5 MG TABS tablet Take 1 tablet (5 mg total) by mouth 2 (two) times daily. 180 tablet 3  . aspirin EC 81 MG tablet Take 1 tablet (81 mg total) by mouth daily. 90 tablet 3  . atorvastatin (LIPITOR) 20 MG tablet Take 1 tablet (20 mg total) by mouth daily. 90 tablet 1  . Cyanocobalamin (VITAMIN B-12) 1000 MCG SUBL Place 1 tablet (1,000 mcg total) under the tongue daily. 90 tablet 0  . Fluticasone-Umeclidin-Vilant (TRELEGY ELLIPTA) 100-62.5-25 MCG/INH AEPB Inhale 1 puff into the lungs daily. 60 each 2  .  gabapentin (NEURONTIN) 100 MG capsule 1po qHS x 4 days, then bid as tolerated not to take on workdays    . lidocaine (LIDODERM) 5 % Place 1 patch onto the skin daily. Remove & Discard patch within 12 hours or as directed by MD 30 patch 0  . lisinopril (ZESTRIL) 20 MG tablet Take 1 tablet by mouth once daily 90 tablet 0  . ondansetron (ZOFRAN ODT) 4 MG disintegrating tablet Take 1 tablet (4 mg total) by mouth every 8 (eight) hours as needed. 20 tablet 0  . Oxycodone HCl 10 MG TABS Take 10 mg by mouth 4 (four) times daily as needed.    . thiamine (VITAMIN B-1) 50 MG tablet Take 1 tablet (50 mg total) by mouth daily. 30 tablet 2   No current facility-administered medications for this visit.    Review of Systems  Constitutional:  Constitutional negative. HENT: HENT negative.  Eyes: Eyes negative.  Respiratory: Respiratory negative.  Cardiovascular: Cardiovascular negative.  GI: Gastrointestinal negative.  Musculoskeletal: Musculoskeletal negative.  Skin: Skin negative.  Neurological: Neurological negative. Hematologic: Hematologic/lymphatic negative.  Psychiatric: Psychiatric  negative.        Objective:  Objective   Vitals:   08/06/19 1441  BP: (!) 198/102  Pulse: 88  Resp: 20  Temp: (!) 97.2 F (36.2 C)  SpO2: 98%  Weight: 141 lb (64 kg)  Height: 5\' 8"  (1.727 m)   Body mass index is 21.44 kg/m.  Physical Exam Constitutional:      Appearance: Normal appearance.  HENT:     Head: Normocephalic.     Nose: Nose normal.     Mouth/Throat:     Mouth: Mucous membranes are moist.  Eyes:     Pupils: Pupils are equal, round, and reactive to light.  Cardiovascular:     Rate and Rhythm: Normal rate.  Pulmonary:     Effort: Pulmonary effort is normal.  Abdominal:     General: Abdomen is flat.     Palpations: Abdomen is soft.  Musculoskeletal:        General: No swelling. Normal range of motion.     Cervical back: Normal range of motion.  Skin:    General: Skin is warm and  dry.     Capillary Refill: Capillary refill takes less than 2 seconds.  Neurological:     General: No focal deficit present.     Mental Status: He is alert.  Psychiatric:        Mood and Affect: Mood normal.        Behavior: Behavior normal.        Thought Content: Thought content normal.        Judgment: Judgment normal.     Data: No studies today       Assessment/Plan:     64 year old male presents for second opinion.  He has had multiple endovascular interventions of the right lower extremity including stenting of most of the SFA popliteal segment and what appears to be the TP trunk below the knee.  I cannot palpate any distal pulses he has short distance claudication at this time.  With this we will get noninvasive studies have him follow back up.  I would think that if the stents are occluded it would be time to consider open intervention at elective time.  To prevent further emergent endovascular options.  Thankfully he has quit smoking.  States that he is diligent about his medicines.  We will get him to follow-up in the near future with duplex and ABIs.     Waynetta Sandy MD [facility]

## 2019-08-10 ENCOUNTER — Other Ambulatory Visit: Payer: Self-pay | Admitting: *Deleted

## 2019-08-10 DIAGNOSIS — I739 Peripheral vascular disease, unspecified: Secondary | ICD-10-CM

## 2019-08-13 ENCOUNTER — Encounter (HOSPITAL_COMMUNITY): Payer: Self-pay

## 2019-08-13 ENCOUNTER — Ambulatory Visit: Payer: Self-pay | Admitting: Vascular Surgery

## 2019-09-02 ENCOUNTER — Telehealth (HOSPITAL_COMMUNITY): Payer: Self-pay

## 2019-09-02 NOTE — Telephone Encounter (Signed)

## 2019-09-03 ENCOUNTER — Encounter: Payer: Self-pay | Admitting: Vascular Surgery

## 2019-09-03 ENCOUNTER — Ambulatory Visit (INDEPENDENT_AMBULATORY_CARE_PROVIDER_SITE_OTHER)
Admission: RE | Admit: 2019-09-03 | Discharge: 2019-09-03 | Disposition: A | Discharge status: Home / Self Care | Payer: BLUE CROSS/BLUE SHIELD | Source: Ambulatory Visit | Attending: Vascular Surgery | Admitting: Vascular Surgery

## 2019-09-03 ENCOUNTER — Ambulatory Visit (INDEPENDENT_AMBULATORY_CARE_PROVIDER_SITE_OTHER): Payer: BLUE CROSS/BLUE SHIELD | Admitting: Vascular Surgery

## 2019-09-03 ENCOUNTER — Other Ambulatory Visit: Payer: Self-pay

## 2019-09-03 ENCOUNTER — Ambulatory Visit (HOSPITAL_COMMUNITY)
Admission: RE | Admit: 2019-09-03 | Discharge: 2019-09-03 | Disposition: A | Discharge status: Home / Self Care | Payer: BLUE CROSS/BLUE SHIELD | Source: Ambulatory Visit | Attending: Vascular Surgery | Admitting: Vascular Surgery

## 2019-09-03 VITALS — BP 149/95 | HR 78 | Temp 97.6°F | Resp 18 | Ht 68.0 in | Wt 144.0 lb

## 2019-09-03 DIAGNOSIS — I739 Peripheral vascular disease, unspecified: Secondary | ICD-10-CM

## 2019-09-03 NOTE — Progress Notes (Signed)
Patient ID: Ryan [name], male   DOB: Jul 20, 1955, 64 y.o.   MRN: [id number]  Reason for Consult: Follow-up   Referred by Steele Sizer, MD  Subjective:     HPI:  Ryan [name] is a 64 y.o. male previous history of multiple lower extremity endovascular interventions.  Most recently had penumbra of his below-knee popliteal artery in August 2020 and [place].  Also had a similar procedure interventional radiology at [facility] in December.  Not short distance claudication.  Has continued to not smoke.  Does take Eliquis and aspirin and statin at this time.  Follows up from recent office visit with lower extremity studies today.has very short distance claudication occurs even when walking his house.  Also has occasional pain while resting.  Past Medical History:  Diagnosis Date  . Hyperlipidemia   . Hypertension   . Neuromuscular disorder (HCC)    numbness feet  . Peripheral vascular disease ([place])   . Shortness of breath dyspnea    Family History  Problem Relation Age of Onset  . COPD Mother   . Hypertension Father   . Heart attack Brother    Past Surgical History:  Procedure Laterality Date  . COLONOSCOPY WITH PROPOFOL N/A 02/15/2016   Procedure: COLONOSCOPY WITH PROPOFOL;  Surgeon: Lucilla Lame, MD;  Location: [place];  Service: Endoscopy;  Laterality: N/A;  . HERNIA REPAIR  1999   left inguinal  . LOWER EXTREMITY ANGIOGRAPHY Right 05/07/2018   Procedure: LOWER EXTREMITY ANGIOGRAPHY;  Surgeon: Algernon Huxley, MD;  Location: [place] [facility];  Service: Cardiovascular;  Laterality: Right;  . LOWER EXTREMITY ANGIOGRAPHY Right 06/25/2018   Procedure: LOWER EXTREMITY ANGIOGRAPHY;  Surgeon: Algernon Huxley, MD;  Location: [place] [facility];  Service: Cardiovascular;  Laterality: Right;  . LOWER EXTREMITY ANGIOGRAPHY Right 07/01/2018   Procedure: LOWER EXTREMITY ANGIOGRAPHY;  Surgeon: Algernon Huxley, MD;  Location: [place] [facility];  Service: Cardiovascular;  Laterality:  Right;  . LOWER EXTREMITY ANGIOGRAPHY Right 08/12/2018   Procedure: LOWER EXTREMITY ANGIOGRAPHY;  Surgeon: Algernon Huxley, MD;  Location: [place] [facility];  Service: Cardiovascular;  Laterality: Right;  . LOWER EXTREMITY ANGIOGRAPHY Right 09/03/2018   Procedure: LOWER EXTREMITY ANGIOGRAPHY;  Surgeon: Algernon Huxley, MD;  Location: [place] [facility];  Service: Cardiovascular;  Laterality: Right;  . LOWER EXTREMITY ANGIOGRAPHY Right 09/04/2018   Procedure: Lower Extremity Angiography;  Surgeon: Algernon Huxley, MD;  Location: [place] [facility];  Service: Cardiovascular;  Laterality: Right;  . LOWER EXTREMITY ANGIOGRAPHY Right 10/01/2018   Procedure: LOWER EXTREMITY ANGIOGRAPHY;  Surgeon: Algernon Huxley, MD;  Location: [place] [facility];  Service: Cardiovascular;  Laterality: Right;  . LOWER EXTREMITY ANGIOGRAPHY Right 10/01/2018   Procedure: Lower Extremity Angiography;  Surgeon: Algernon Huxley, MD;  Location: [place] [facility];  Service: Cardiovascular;  Laterality: Right;  . LOWER EXTREMITY ANGIOGRAPHY Right 10/15/2018   Procedure: LOWER EXTREMITY ANGIOGRAPHY;  Surgeon: Algernon Huxley, MD;  Location: [place] [facility];  Service: Cardiovascular;  Laterality: Right;  . LOWER EXTREMITY ANGIOGRAPHY Left 10/16/2018   Procedure: Lower Extremity Angiography;  Surgeon: Algernon Huxley, MD;  Location: [place] [facility];  Service: Cardiovascular;  Laterality: Left;  . LOWER EXTREMITY ANGIOGRAPHY Left 01/20/2019   Procedure: LOWER EXTREMITY ANGIOGRAPHY;  Surgeon: Algernon Huxley, MD;  Location: [place] [facility];  Service: Cardiovascular;  Laterality: Left;  . LOWER EXTREMITY ANGIOGRAPHY Right 01/21/2019   Procedure: Lower Extremity Angiography;  Surgeon: Algernon Huxley, MD;  Location: [place] [facility];  Service: Cardiovascular;  Laterality: Right;  . LOWER EXTREMITY ANGIOGRAPHY Right 01/28/2019   Procedure: LOWER EXTREMITY ANGIOGRAPHY;  Surgeon: Algernon Huxley, MD;  Location: [place] [facility];  Service:  Cardiovascular;  Laterality: Right;  . LOWER EXTREMITY ANGIOGRAPHY Right 01/29/2019   Procedure: Lower Extremity Angiography;  Surgeon: Algernon Huxley, MD;  Location: [place] [facility];  Service: Cardiovascular;  Laterality: Right;  . LOWER EXTREMITY INTERVENTION Right 07/02/2018   Procedure: LOWER EXTREMITY INTERVENTION;  Surgeon: Algernon Huxley, MD;  Location: [place] [facility];  Service: Cardiovascular;  Laterality: Right;  . POLYPECTOMY N/A 02/15/2016   Procedure: POLYPECTOMY;  Surgeon: Lucilla Lame, MD;  Location: [place];  Service: Endoscopy;  Laterality: N/A;    Short Social History:  Social History   Tobacco Use  . Smoking status: Former Smoker    Packs/day: 0.50    Years: 40.00    Pack years: 20.00    Types: Cigarettes    Start date: 07/21/1978    Quit date: 12/2018    Years since quitting: 0.6  . Smokeless tobacco: Never Used  Substance Use Topics  . Alcohol use: Yes    Alcohol/week: 12.0 standard drinks    Types: 12 Cans of beer per week    No Known Allergies  Current Outpatient Medications  Medication Sig Dispense Refill  . acetaminophen (TYLENOL) 500 MG tablet Take 1 tablet (500 mg total) by mouth 2 (two) times daily. 100 tablet 0  . amLODipine (NORVASC) 2.5 MG tablet Take 1 tablet (2.5 mg total) by mouth daily. 90 tablet 0  . apixaban (ELIQUIS) 5 MG TABS tablet Take 1 tablet (5 mg total) by mouth 2 (two) times daily. 180 tablet 3  . aspirin EC 81 MG tablet Take 1 tablet (81 mg total) by mouth daily. 90 tablet 3  . atorvastatin (LIPITOR) 20 MG tablet Take 1 tablet (20 mg total) by mouth daily. 90 tablet 1  . Cyanocobalamin (VITAMIN B-12) 1000 MCG SUBL Place 1 tablet (1,000 mcg total) under the tongue daily. 90 tablet 0  . Fluticasone-Umeclidin-Vilant (TRELEGY ELLIPTA) 100-62.5-25 MCG/INH AEPB Inhale 1 puff into the lungs daily. 60 each 2  . gabapentin (NEURONTIN) 100 MG capsule 1po qHS x 4 days, then bid as tolerated not to take on workdays    . lidocaine  (LIDODERM) 5 % Place 1 patch onto the skin daily. Remove & Discard patch within 12 hours or as directed by MD 30 patch 0  . lisinopril (ZESTRIL) 20 MG tablet Take 1 tablet by mouth once daily 90 tablet 0  . ondansetron (ZOFRAN ODT) 4 MG disintegrating tablet Take 1 tablet (4 mg total) by mouth every 8 (eight) hours as needed. 20 tablet 0  . Oxycodone HCl 10 MG TABS Take 10 mg by mouth 4 (four) times daily as needed.    . thiamine (VITAMIN B-1) 50 MG tablet Take 1 tablet (50 mg total) by mouth daily. 30 tablet 2   No current facility-administered medications for this visit.    Review of Systems  Constitutional:  Constitutional negative. HENT: HENT negative.  Eyes: Eyes negative.  Cardiovascular: Positive for claudication.  GI: Gastrointestinal negative.  Musculoskeletal: Musculoskeletal negative.  Skin: Skin negative.  Neurological: Neurological negative. Hematologic: Hematologic/lymphatic negative.  Psychiatric: Psychiatric negative.        Objective:  Objective  Vitals:   09/03/19 1502  BP: (!) 149/95  Pulse: 78  Resp: 18  Temp: 97.6 F (36.4 C)  SpO2: 98%    Physical Exam HENT:     Head: Normocephalic.     Mouth/Throat:     Mouth: Mucous membranes are moist.  Eyes:     Pupils: Pupils are equal, round, and reactive to light.  Cardiovascular:     Rate and Rhythm: Normal rate.     Pulses:          Femoral pulses are 1+ on the right side and 2+ on the left side.      Popliteal pulses are 2+ on the left side.  Pulmonary:     Effort: Pulmonary effort is normal.  Abdominal:     General: Abdomen is flat.     Palpations: Abdomen is soft.  Musculoskeletal:        General: No swelling. Normal range of motion.  Skin:    General: Skin is warm.  Neurological:     General: No focal deficit present.     Mental Status: He is alert.  Psychiatric:        Mood and Affect: Mood normal.        Thought Content: Thought content normal.        Judgment: Judgment normal.      Data: Right ABI 0.2 and monophasic left ABI is 1.01 and triphasic. Interpreted his right lower extremity arterial duplex.  Common femoral artery is monophasic popliteal artery is occluded.  Stents in the right lower extremity are all occluded.     Assessment/Plan:     64 year old male with critical right lower extremity ischemia with rest pain.  Previous multiple endovascular interventions.  We will plan for right lower extremity angiography admission to the hospital and right lower extremity bypass following this.  Can likely hold Eliquis indefinitely given that this was initially for the right lower extremity stents that are now occluded.  He does appear to have vein in his right calf that could hopefully be suitable for bypass but we will map after angiography.     Waynetta Sandy MD [facility]

## 2019-09-05 ENCOUNTER — Other Ambulatory Visit: Payer: Self-pay | Admitting: Family Medicine

## 2019-09-05 DIAGNOSIS — I700 Atherosclerosis of aorta: Secondary | ICD-10-CM

## 2019-09-05 DIAGNOSIS — I10 Essential (primary) hypertension: Secondary | ICD-10-CM

## 2019-09-05 DIAGNOSIS — I739 Peripheral vascular disease, unspecified: Secondary | ICD-10-CM

## 2019-09-05 NOTE — Telephone Encounter (Signed)
Requested Prescriptions  Pending Prescriptions Disp Refills  . atorvastatin (LIPITOR) 20 MG tablet [Pharmacy Med Name: Atorvastatin Calcium 20 MG Oral Tablet] 90 tablet 0    Sig: Take 1 tablet by mouth once daily     Cardiovascular:  Antilipid - Statins Passed - 09/05/2019 10:53 AM      Passed - Total Cholesterol in normal range and within 360 days    Cholesterol, Total  Date Value Ref Range Status  08/31/2015 180 100 - 199 mg/dL Final   Cholesterol  Date Value Ref Range Status  11/20/2018 174 <200 mg/dL Final         Passed - LDL in normal range and within 360 days    LDL Cholesterol (Calc)  Date Value Ref Range Status  11/20/2018 71 mg/dL (calc) Final    Comment:    Reference range: <100 . Desirable range <100 mg/dL for primary prevention;   <70 mg/dL for patients with CHD or diabetic patients  with > or = 2 CHD risk factors. Marland Kitchen LDL-C is now calculated using the Martin-Hopkins  calculation, which is a validated novel method providing  better accuracy than the Friedewald equation in the  estimation of LDL-C.  Cresenciano Genre et al. Annamaria Helling. [id number]): 2061-2068  (http://education.[employer].com/faq/FAQ164)          Passed - HDL in normal range and within 360 days    HDL  Date Value Ref Range Status  11/20/2018 92 > OR = 40 mg/dL Final  08/31/2015 98 >39 mg/dL Final         Passed - Triglycerides in normal range and within 360 days    Triglycerides  Date Value Ref Range Status  11/20/2018 39 <150 mg/dL Final         Passed - Patient is not pregnant      Passed - Valid encounter within last 12 months    Recent Outpatient Visits          2 months ago PAD (peripheral artery disease) ([place])   [facility], Drue Stager, MD   4 months ago Left lumbar radiculopathy   [facility] [place], Drue Stager, MD   6 months ago Hypertension, benign   [facility] [place], Drue Stager, MD   9 months ago Hypertension, benign   [facility] Steele Sizer, MD   9 months ago PAD (peripheral artery disease) [facility])   [facility] [place], Drue Stager, MD             . lisinopril (ZESTRIL) 20 MG tablet [Pharmacy Med Name: Lisinopril 20 MG Oral Tablet] 90 tablet 0    Sig: Take 1 tablet by mouth once daily     Cardiovascular:  ACE Inhibitors Failed - 09/05/2019 10:53 AM      Failed - Cr in normal range and within 180 days    Creat  Date Value Ref Range Status  11/20/2018 1.67 (H) 0.70 - 1.25 mg/dL Final    Comment:    For patients >53 years of age, the reference limit for Creatinine is approximately 13% higher for people identified as African-American. .    Creatinine, Ser  Date Value Ref Range Status  07/18/2019 1.65 (H) 0.61 - 1.24 mg/dL Final         Failed - Last BP in normal range    BP Readings from Last 1 Encounters:  09/03/19 (!) 149/95         Passed - K in normal range and within 180  days    Potassium  Date Value Ref Range Status  07/18/2019 4.1 3.5 - 5.1 mmol/L Final         Passed - Patient is not pregnant      Passed - Valid encounter within last 6 months    Recent Outpatient Visits          2 months ago PAD (peripheral artery disease) [facility])   [facility] Steele Sizer, MD   4 months ago Left lumbar radiculopathy   [facility] Steele Sizer, MD   6 months ago Hypertension, benign   [facility] Steele Sizer, MD   9 months ago Hypertension, benign   [facility] [place], Drue Stager, MD   9 months ago PAD (peripheral artery disease) [facility])   [facility] Steele Sizer, MD

## 2019-09-09 ENCOUNTER — Other Ambulatory Visit: Payer: Self-pay

## 2019-09-10 ENCOUNTER — Other Ambulatory Visit
Admission: RE | Admit: 2019-09-10 | Discharge: 2019-09-10 | Disposition: A | Discharge status: Home / Self Care | Payer: BLUE CROSS/BLUE SHIELD | Source: Ambulatory Visit | Attending: Vascular Surgery | Admitting: Vascular Surgery

## 2019-09-10 DIAGNOSIS — Z20822 Contact with and (suspected) exposure to covid-19: Secondary | ICD-10-CM | POA: Insufficient documentation

## 2019-09-10 DIAGNOSIS — Z01812 Encounter for preprocedural laboratory examination: Secondary | ICD-10-CM | POA: Insufficient documentation

## 2019-09-10 LAB — SARS CORONAVIRUS 2 (TAT 6-24 HRS): SARS Coronavirus 2: NEGATIVE

## 2019-09-13 ENCOUNTER — Encounter (HOSPITAL_COMMUNITY)
Admission: RE | Disposition: A | Discharge status: Home Health | Payer: Self-pay | Source: Home / Self Care | Attending: Vascular Surgery

## 2019-09-13 ENCOUNTER — Inpatient Hospital Stay (HOSPITAL_COMMUNITY): Payer: BLUE CROSS/BLUE SHIELD

## 2019-09-13 ENCOUNTER — Other Ambulatory Visit: Payer: Self-pay

## 2019-09-13 ENCOUNTER — Inpatient Hospital Stay (HOSPITAL_COMMUNITY)
Admission: RE | Admit: 2019-09-13 | Discharge: 2019-09-19 | DRG: 271 | Disposition: A | Discharge status: Home Health | Payer: BLUE CROSS/BLUE SHIELD | Attending: Vascular Surgery | Admitting: Vascular Surgery

## 2019-09-13 DIAGNOSIS — Z7982 Long term (current) use of aspirin: Secondary | ICD-10-CM

## 2019-09-13 DIAGNOSIS — I959 Hypotension, unspecified: Secondary | ICD-10-CM | POA: Diagnosis not present

## 2019-09-13 DIAGNOSIS — Z01810 Encounter for preprocedural cardiovascular examination: Secondary | ICD-10-CM

## 2019-09-13 DIAGNOSIS — I70221 Atherosclerosis of native arteries of extremities with rest pain, right leg: Principal | ICD-10-CM | POA: Diagnosis present

## 2019-09-13 DIAGNOSIS — N179 Acute kidney failure, unspecified: Secondary | ICD-10-CM | POA: Diagnosis present

## 2019-09-13 DIAGNOSIS — J449 Chronic obstructive pulmonary disease, unspecified: Secondary | ICD-10-CM | POA: Diagnosis present

## 2019-09-13 DIAGNOSIS — D62 Acute posthemorrhagic anemia: Secondary | ICD-10-CM | POA: Diagnosis not present

## 2019-09-13 DIAGNOSIS — Z7901 Long term (current) use of anticoagulants: Secondary | ICD-10-CM

## 2019-09-13 DIAGNOSIS — I998 Other disorder of circulatory system: Secondary | ICD-10-CM

## 2019-09-13 DIAGNOSIS — T82858A Stenosis of vascular prosthetic devices, implants and grafts, initial encounter: Secondary | ICD-10-CM | POA: Diagnosis present

## 2019-09-13 DIAGNOSIS — I739 Peripheral vascular disease, unspecified: Secondary | ICD-10-CM | POA: Diagnosis present

## 2019-09-13 DIAGNOSIS — Z419 Encounter for procedure for purposes other than remedying health state, unspecified: Secondary | ICD-10-CM

## 2019-09-13 DIAGNOSIS — Z79899 Other long term (current) drug therapy: Secondary | ICD-10-CM

## 2019-09-13 DIAGNOSIS — Z87891 Personal history of nicotine dependence: Secondary | ICD-10-CM | POA: Diagnosis not present

## 2019-09-13 DIAGNOSIS — N183 Chronic kidney disease, stage 3 unspecified: Secondary | ICD-10-CM | POA: Diagnosis present

## 2019-09-13 DIAGNOSIS — Z825 Family history of asthma and other chronic lower respiratory diseases: Secondary | ICD-10-CM | POA: Diagnosis not present

## 2019-09-13 DIAGNOSIS — I10 Essential (primary) hypertension: Secondary | ICD-10-CM

## 2019-09-13 DIAGNOSIS — E785 Hyperlipidemia, unspecified: Secondary | ICD-10-CM | POA: Diagnosis present

## 2019-09-13 DIAGNOSIS — Z8249 Family history of ischemic heart disease and other diseases of the circulatory system: Secondary | ICD-10-CM | POA: Diagnosis not present

## 2019-09-13 DIAGNOSIS — I70229 Atherosclerosis of native arteries of extremities with rest pain, unspecified extremity: Secondary | ICD-10-CM | POA: Diagnosis not present

## 2019-09-13 DIAGNOSIS — S301XXA Contusion of abdominal wall, initial encounter: Secondary | ICD-10-CM | POA: Diagnosis not present

## 2019-09-13 DIAGNOSIS — I129 Hypertensive chronic kidney disease with stage 1 through stage 4 chronic kidney disease, or unspecified chronic kidney disease: Secondary | ICD-10-CM | POA: Diagnosis present

## 2019-09-13 DIAGNOSIS — E872 Acidosis: Secondary | ICD-10-CM | POA: Diagnosis present

## 2019-09-13 HISTORY — PX: ABDOMINAL AORTOGRAM W/LOWER EXTREMITY: CATH118223

## 2019-09-13 LAB — CBC
HCT: 30.4 % — ABNORMAL LOW (ref 39.0–52.0)
Hemoglobin: 9.8 g/dL — ABNORMAL LOW (ref 13.0–17.0)
MCH: 28.7 pg (ref 26.0–34.0)
MCHC: 32.2 g/dL (ref 30.0–36.0)
MCV: 89.1 fL (ref 80.0–100.0)
Platelets: 238 10*3/uL (ref 150–400)
RBC: 3.41 MIL/uL — ABNORMAL LOW (ref 4.22–5.81)
RDW: 14.7 % (ref 11.5–15.5)
WBC: 4.9 10*3/uL (ref 4.0–10.5)
nRBC: 0 % (ref 0.0–0.2)

## 2019-09-13 LAB — POCT I-STAT, CHEM 8
BUN: 24 mg/dL — ABNORMAL HIGH (ref 8–23)
Calcium, Ion: 1.22 mmol/L (ref 1.15–1.40)
Chloride: 110 mmol/L (ref 98–111)
Creatinine, Ser: 1.8 mg/dL — ABNORMAL HIGH (ref 0.61–1.24)
Glucose, Bld: 82 mg/dL (ref 70–99)
HCT: 35 % — ABNORMAL LOW (ref 39.0–52.0)
Hemoglobin: 11.9 g/dL — ABNORMAL LOW (ref 13.0–17.0)
Potassium: 4.2 mmol/L (ref 3.5–5.1)
Sodium: 139 mmol/L (ref 135–145)
TCO2: 25 mmol/L (ref 22–32)

## 2019-09-13 LAB — CREATININE, SERUM
Creatinine, Ser: 1.95 mg/dL — ABNORMAL HIGH (ref 0.61–1.24)
GFR calc Af Amer: 42 mL/min — ABNORMAL LOW (ref >60)
GFR calc non Af Amer: 36 mL/min — ABNORMAL LOW (ref >60)

## 2019-09-13 LAB — PROTIME-INR
INR: 1.2 (ref 0.8–1.2)
Prothrombin Time: 14.6 seconds (ref 11.4–15.2)

## 2019-09-13 LAB — APTT: aPTT: 32 seconds (ref 24–36)

## 2019-09-13 SURGERY — ABDOMINAL AORTOGRAM W/LOWER EXTREMITY
Anesthesia: LOCAL

## 2019-09-13 MED ORDER — SODIUM CHLORIDE 0.9 % IV SOLN: INTRAVENOUS | Status: DC

## 2019-09-13 MED ORDER — FENTANYL CITRATE (PF) 100 MCG/2ML IJ SOLN
INTRAMUSCULAR | Status: DC | PRN
  Administered 2019-09-13: 50 ug via INTRAVENOUS

## 2019-09-13 MED ORDER — CHLORHEXIDINE GLUCONATE CLOTH 2 % EX PADS
6.0000 | MEDICATED_PAD | Freq: Once | CUTANEOUS | Status: AC
  Administered 2019-09-13: 21:00:00 6 via TOPICAL

## 2019-09-13 MED ORDER — LIDOCAINE HCL (PF) 1 % IJ SOLN
INTRAMUSCULAR | Status: DC | PRN
  Administered 2019-09-13: 15 mL via INTRADERMAL

## 2019-09-13 MED ORDER — ONDANSETRON HCL 4 MG/2ML IJ SOLN: 4.0000 mg | Freq: Four times a day (QID) | INTRAMUSCULAR | Status: DC | PRN

## 2019-09-13 MED ORDER — HYDROMORPHONE HCL 1 MG/ML IJ SOLN
0.5000 mg | INTRAMUSCULAR | Status: DC | PRN
  Administered 2019-09-13: 1 mg via INTRAVENOUS
  Administered 2019-09-14: 0.5 mg via INTRAVENOUS
  Filled 2019-09-13: qty 1

## 2019-09-13 MED ORDER — LIDOCAINE HCL (PF) 1 % IJ SOLN
INTRAMUSCULAR | Status: AC
  Filled 2019-09-13: qty 30

## 2019-09-13 MED ORDER — HEPARIN SODIUM (PORCINE) 5000 UNIT/ML IJ SOLN
5000.0000 [IU] | Freq: Three times a day (TID) | INTRAMUSCULAR | Status: DC
  Administered 2019-09-13: 5000 [IU] via SUBCUTANEOUS
  Filled 2019-09-13: qty 1

## 2019-09-13 MED ORDER — HYDRALAZINE HCL 20 MG/ML IJ SOLN: 20.0000 mg | INTRAMUSCULAR | Status: DC | PRN

## 2019-09-13 MED ORDER — IODIXANOL 320 MG/ML IV SOLN
INTRAVENOUS | Status: DC | PRN
  Administered 2019-09-13: 124 mL via INTRA_ARTERIAL

## 2019-09-13 MED ORDER — FENTANYL CITRATE (PF) 100 MCG/2ML IJ SOLN
INTRAMUSCULAR | Status: AC
  Filled 2019-09-13: qty 2

## 2019-09-13 MED ORDER — OXYCODONE HCL 5 MG PO TABS
ORAL_TABLET | ORAL | Status: AC
  Filled 2019-09-13: qty 2

## 2019-09-13 MED ORDER — THIAMINE HCL 100 MG PO TABS
50.0000 mg | ORAL_TABLET | Freq: Every day | ORAL | Status: DC
  Administered 2019-09-15 – 2019-09-19 (×5): 50 mg via ORAL
  Filled 2019-09-13 (×5): qty 1

## 2019-09-13 MED ORDER — HEPARIN (PORCINE) IN NACL 1000-0.9 UT/500ML-% IV SOLN
INTRAVENOUS | Status: AC
  Filled 2019-09-13: qty 1000

## 2019-09-13 MED ORDER — AMLODIPINE BESYLATE 5 MG PO TABS
2.5000 mg | ORAL_TABLET | Freq: Every day | ORAL | Status: DC
  Administered 2019-09-13 – 2019-09-15 (×2): 2.5 mg via ORAL
  Filled 2019-09-13 (×2): qty 1

## 2019-09-13 MED ORDER — ACETAMINOPHEN 325 MG PO TABS: 650.0000 mg | ORAL_TABLET | ORAL | Status: DC | PRN

## 2019-09-13 MED ORDER — ASPIRIN EC 81 MG PO TBEC
81.0000 mg | DELAYED_RELEASE_TABLET | Freq: Every day | ORAL | Status: DC
  Administered 2019-09-15 – 2019-09-19 (×5): 81 mg via ORAL
  Filled 2019-09-13 (×5): qty 1

## 2019-09-13 MED ORDER — HEPARIN (PORCINE) IN NACL 1000-0.9 UT/500ML-% IV SOLN
INTRAVENOUS | Status: DC | PRN
  Administered 2019-09-13 (×2): 500 mL

## 2019-09-13 MED ORDER — CHLORHEXIDINE GLUCONATE CLOTH 2 % EX PADS
6.0000 | MEDICATED_PAD | Freq: Once | CUTANEOUS | Status: AC
  Administered 2019-09-14: 03:00:00 6 via TOPICAL

## 2019-09-13 MED ORDER — SODIUM CHLORIDE 0.9 % IV SOLN: 250.0000 mL | INTRAVENOUS | Status: DC | PRN

## 2019-09-13 MED ORDER — CLONIDINE HCL 0.2 MG PO TABS
0.2000 mg | ORAL_TABLET | ORAL | Status: DC | PRN
  Administered 2019-09-13: 15:00:00 0.2 mg via ORAL
  Filled 2019-09-13: qty 1

## 2019-09-13 MED ORDER — ATORVASTATIN CALCIUM 10 MG PO TABS
20.0000 mg | ORAL_TABLET | Freq: Every day | ORAL | Status: DC
  Administered 2019-09-15 – 2019-09-19 (×5): 20 mg via ORAL
  Filled 2019-09-13 (×5): qty 2

## 2019-09-13 MED ORDER — MIDAZOLAM HCL 2 MG/2ML IJ SOLN
INTRAMUSCULAR | Status: AC
  Filled 2019-09-13: qty 2

## 2019-09-13 MED ORDER — MIDAZOLAM HCL 2 MG/2ML IJ SOLN
INTRAMUSCULAR | Status: DC | PRN
  Administered 2019-09-13: 1 mg via INTRAVENOUS

## 2019-09-13 MED ORDER — HYDROMORPHONE HCL 1 MG/ML IJ SOLN
INTRAMUSCULAR | Status: AC
  Filled 2019-09-13: qty 1

## 2019-09-13 MED ORDER — LABETALOL HCL 5 MG/ML IV SOLN
INTRAVENOUS | Status: AC
  Filled 2019-09-13: qty 4

## 2019-09-13 MED ORDER — LABETALOL HCL 5 MG/ML IV SOLN: 20.0000 mg | INTRAVENOUS | Status: DC | PRN

## 2019-09-13 MED ORDER — OXYCODONE HCL 5 MG PO TABS
5.0000 mg | ORAL_TABLET | ORAL | Status: DC | PRN
  Administered 2019-09-13 (×2): 10 mg via ORAL
  Filled 2019-09-13: qty 2

## 2019-09-13 MED ORDER — SODIUM CHLORIDE 0.9% FLUSH: 3.0000 mL | INTRAVENOUS | Status: DC | PRN

## 2019-09-13 MED ORDER — LABETALOL HCL 5 MG/ML IV SOLN
INTRAVENOUS | Status: DC | PRN
  Administered 2019-09-13: 20 mg via INTRAVENOUS

## 2019-09-13 MED ORDER — SODIUM CHLORIDE 0.9% FLUSH: 3.0000 mL | Freq: Two times a day (BID) | INTRAVENOUS | Status: DC

## 2019-09-13 MED ORDER — SODIUM CHLORIDE 0.9 % IV SOLN: INTRAVENOUS | Status: AC

## 2019-09-13 MED ORDER — CEFAZOLIN SODIUM-DEXTROSE 2-4 GM/100ML-% IV SOLN
2.0000 g | INTRAVENOUS | Status: AC
  Administered 2019-09-14: 2 g via INTRAVENOUS

## 2019-09-13 SURGICAL SUPPLY — 10 items
CATH OMNI FLUSH 5F 65CM (CATHETERS) ×2 IMPLANT
KIT MICROPUNCTURE NIT STIFF (SHEATH) ×2 IMPLANT
KIT PV (KITS) ×2 IMPLANT
SHEATH PINNACLE 5F 10CM (SHEATH) ×2 IMPLANT
SHEATH PROBE COVER 6X72 (BAG) ×2 IMPLANT
SYR MEDRAD MARK V 150ML (SYRINGE) ×2 IMPLANT
TRANSDUCER W/STOPCOCK (MISCELLANEOUS) ×2 IMPLANT
TRAY PV CATH (CUSTOM PROCEDURE TRAY) ×2 IMPLANT
WIRE BENTSON .035X145CM (WIRE) ×2 IMPLANT
WIRE TORQFLEX AUST .018X40CM (WIRE) ×2 IMPLANT

## 2019-09-13 NOTE — Progress Notes (Signed)
Lower extremity saphenous mapping has been completed.   Preliminary results in CV Proc.   Abram Sander 09/13/2019 4:24 PM

## 2019-09-13 NOTE — Progress Notes (Signed)
Patient arrived to unit from cath lab. Oriented to unit, vss, CHG given, bedrest till 1900 and patient given [place] sandwich and Sprite. Patient aware he is having surgery tomorrow and will be NPO after midnight. Left groin site checked frequently and level 0. Patient aware to keep leg straight. Pt resting with call bell within reach.  Will continue to monitor.

## 2019-09-13 NOTE — Progress Notes (Signed)
Site area: left groin fa sheath Site Prior to Removal:  Level 0 Pressure Applied For:  20 minutes Manual:   yes Patient Status During Pull:  stable Post Pull Site:  Level 0 Post Pull Instructions Given:  yes Post Pull Pulses Present: left pt dopplered Dressing Applied:  Gauze and tegaderm Bedrest begins @ [id number] Comments:

## 2019-09-13 NOTE — Op Note (Signed)
    Patient name: Ryan [name] MRN: [id number] DOB: 12/03/1955 Sex: male  09/13/2019 Pre-operative Diagnosis: critical right lower extremity ischemia Post-operative diagnosis:  Same Surgeon:  Erlene Quan C. [name], MD Procedure Performed: 1.  US guided cannulation of left common femoral artery 2.  Aortogram with bilateral runoff 3.  Selection of right common femoral artery 4.  Moderate sedation with fentanyl and versed 31 minutes  Indications:  64yo male with history of previous stents in his right lower extremity.  Now has critical right lower extremity ischemia with rest pain.  He is indicated for angiography with possible intervention.  Findings: Right SFA and popliteal stents are occluded.  Right posterior tibial artery stent occludes.  Appears to reconstitute anterior tibial and peroneal in the mid lower leg.  Posterior tibial artery does not appear to go to the foot.  On the left side peers to have inline flow via the posterior tibial and peroneal without any occlusive disease.  We will plan to proceed with right common femoral to either peroneal or anterior tibial artery.   Procedure:  The patient was identified in the holding area and taken to room 8.  The patient was then placed supine on the table and prepped and draped in the usual sterile fashion.  A time out was called.  Ultrasound was used to evaluate the left common femoral artery.  There was a significant scar tissue from previous cannulations.  The areas anesthetized 1% lidocaine cannulated micropuncture needle followed wire sheath.  Images saved department record.  5 French sheath was placed over Bentson wire.  Omni catheter placed to level L1 and aortogram followed by bilateral lower extremity runoff was obtained.  With the above findings across the bifurcation perform dedicated angiography of the right ankle and foot.  With the above findings we will plan for right lower extremity bypass.  Catheter and wire were removed sheath  will be pulled in postoperative holding.   Contrast: 124cc  Kalifa Cadden C. [name], MD [facility] [place] Office: [phone] Pager: [phone]

## 2019-09-13 NOTE — H&P (Signed)
   History and Physical Update  The patient was interviewed and re-examined.  The patient's previous History and Physical has been reviewed and is unchanged from recent office visit. Plan for aortogram with possible right lower extremity angiography.   Thaxton Pelley C. [name], MD [facility] [place] Office: [phone] Pager: [phone]  09/13/2019, 12:06 PM

## 2019-09-14 ENCOUNTER — Inpatient Hospital Stay (HOSPITAL_COMMUNITY): Payer: BLUE CROSS/BLUE SHIELD

## 2019-09-14 ENCOUNTER — Inpatient Hospital Stay (HOSPITAL_COMMUNITY): Payer: BLUE CROSS/BLUE SHIELD | Admitting: Anesthesiology

## 2019-09-14 ENCOUNTER — Inpatient Hospital Stay (HOSPITAL_COMMUNITY)
Admission: RE | Admit: 2019-09-14 | Payer: BLUE CROSS/BLUE SHIELD | Source: Home / Self Care | Admitting: Vascular Surgery

## 2019-09-14 ENCOUNTER — Encounter (HOSPITAL_COMMUNITY)
Admission: RE | Disposition: A | Discharge status: Home Health | Payer: Self-pay | Source: Home / Self Care | Attending: Vascular Surgery

## 2019-09-14 HISTORY — PX: FEMORAL-POPLITEAL BYPASS GRAFT: SHX937

## 2019-09-14 HISTORY — PX: INSERTION OF ILIAC STENT: SHX6256

## 2019-09-14 LAB — COMPREHENSIVE METABOLIC PANEL
ALT: 24 U/L (ref 0–44)
AST: 23 U/L (ref 15–41)
Albumin: 3.4 g/dL — ABNORMAL LOW (ref 3.5–5.0)
Alkaline Phosphatase: 55 U/L (ref 38–126)
Anion gap: 8 (ref 5–15)
BUN: 25 mg/dL — ABNORMAL HIGH (ref 8–23)
CO2: 20 mmol/L — ABNORMAL LOW (ref 22–32)
Calcium: 8.1 mg/dL — ABNORMAL LOW (ref 8.9–10.3)
Chloride: 108 mmol/L (ref 98–111)
Creatinine, Ser: 2.3 mg/dL — ABNORMAL HIGH (ref 0.61–1.24)
GFR calc Af Amer: 34 mL/min — ABNORMAL LOW (ref >60)
GFR calc non Af Amer: 29 mL/min — ABNORMAL LOW (ref >60)
Glucose, Bld: 143 mg/dL — ABNORMAL HIGH (ref 70–99)
Potassium: 4.5 mmol/L (ref 3.5–5.1)
Sodium: 136 mmol/L (ref 135–145)
Total Bilirubin: 0.3 mg/dL (ref 0.3–1.2)
Total Protein: 6.4 g/dL — ABNORMAL LOW (ref 6.5–8.1)

## 2019-09-14 LAB — URINALYSIS, ROUTINE W REFLEX MICROSCOPIC
Bacteria, UA: NONE SEEN
Bilirubin Urine: NEGATIVE
Glucose, UA: NEGATIVE mg/dL
Ketones, ur: NEGATIVE mg/dL
Leukocytes,Ua: NEGATIVE
Nitrite: NEGATIVE
Protein, ur: 30 mg/dL — AB
Specific Gravity, Urine: 1.026 (ref 1.005–1.030)
pH: 5 (ref 5.0–8.0)

## 2019-09-14 LAB — SURGICAL PCR SCREEN
MRSA, PCR: NEGATIVE
Staphylococcus aureus: NEGATIVE

## 2019-09-14 LAB — POCT I-STAT, CHEM 8
BUN: 27 mg/dL — ABNORMAL HIGH (ref 8–23)
Calcium, Ion: 1.19 mmol/L (ref 1.15–1.40)
Chloride: 110 mmol/L (ref 98–111)
Creatinine, Ser: 2.6 mg/dL — ABNORMAL HIGH (ref 0.61–1.24)
Glucose, Bld: 91 mg/dL (ref 70–99)
HCT: 27 % — ABNORMAL LOW (ref 39.0–52.0)
Hemoglobin: 9.2 g/dL — ABNORMAL LOW (ref 13.0–17.0)
Potassium: 4.4 mmol/L (ref 3.5–5.1)
Sodium: 138 mmol/L (ref 135–145)
TCO2: 22 mmol/L (ref 22–32)

## 2019-09-14 LAB — CREATININE, SERUM
Creatinine, Ser: 2.35 mg/dL — ABNORMAL HIGH (ref 0.61–1.24)
GFR calc Af Amer: 33 mL/min — ABNORMAL LOW (ref >60)
GFR calc non Af Amer: 29 mL/min — ABNORMAL LOW (ref >60)

## 2019-09-14 LAB — ABO/RH: ABO/RH(D): A POS

## 2019-09-14 SURGERY — BYPASS GRAFT FEMORAL-POPLITEAL ARTERY
Anesthesia: General | Site: Leg Lower | Laterality: Right

## 2019-09-14 MED ORDER — PAPAVERINE HCL 30 MG/ML IJ SOLN
INTRAMUSCULAR | Status: AC
  Filled 2019-09-14: qty 2

## 2019-09-14 MED ORDER — LABETALOL HCL 5 MG/ML IV SOLN
10.0000 mg | INTRAVENOUS | Status: DC | PRN
  Administered 2019-09-19: 10 mg via INTRAVENOUS
  Filled 2019-09-14: qty 4

## 2019-09-14 MED ORDER — HEPARIN SODIUM (PORCINE) 1000 UNIT/ML IJ SOLN
INTRAMUSCULAR | Status: DC | PRN
  Administered 2019-09-14: 7000 [IU] via INTRAVENOUS
  Administered 2019-09-14 (×2): 4000 [IU] via INTRAVENOUS

## 2019-09-14 MED ORDER — PROPOFOL 10 MG/ML IV BOLUS
INTRAVENOUS | Status: DC | PRN
  Administered 2019-09-14: 110 mg via INTRAVENOUS

## 2019-09-14 MED ORDER — MIDAZOLAM HCL 5 MG/5ML IJ SOLN
INTRAMUSCULAR | Status: DC | PRN
  Administered 2019-09-14: 2 mg via INTRAVENOUS

## 2019-09-14 MED ORDER — PANTOPRAZOLE SODIUM 40 MG PO TBEC
40.0000 mg | DELAYED_RELEASE_TABLET | Freq: Every day | ORAL | Status: DC
  Administered 2019-09-15 – 2019-09-19 (×5): 40 mg via ORAL
  Filled 2019-09-14 (×5): qty 1

## 2019-09-14 MED ORDER — SUGAMMADEX SODIUM 200 MG/2ML IV SOLN
INTRAVENOUS | Status: DC | PRN
  Administered 2019-09-14: 300 mg via INTRAVENOUS

## 2019-09-14 MED ORDER — POTASSIUM CHLORIDE CRYS ER 20 MEQ PO TBCR: 20.0000 meq | EXTENDED_RELEASE_TABLET | Freq: Every day | ORAL | Status: DC | PRN

## 2019-09-14 MED ORDER — CEFAZOLIN SODIUM-DEXTROSE 2-4 GM/100ML-% IV SOLN
2.0000 g | Freq: Three times a day (TID) | INTRAVENOUS | Status: AC
  Administered 2019-09-14 (×2): 2 g via INTRAVENOUS
  Filled 2019-09-14 (×2): qty 100

## 2019-09-14 MED ORDER — SODIUM CHLORIDE 0.9 % IV SOLN
500.0000 mL | Freq: Once | INTRAVENOUS | Status: AC | PRN
  Administered 2019-09-15: 500 mL via INTRAVENOUS

## 2019-09-14 MED ORDER — MUPIROCIN 2 % EX OINT
1.0000 "application " | TOPICAL_OINTMENT | Freq: Two times a day (BID) | CUTANEOUS | Status: AC
  Administered 2019-09-15 – 2019-09-18 (×7): 1 via NASAL
  Filled 2019-09-14: qty 22

## 2019-09-14 MED ORDER — OXYCODONE HCL 5 MG PO TABS
5.0000 mg | ORAL_TABLET | ORAL | Status: DC | PRN
  Administered 2019-09-14 (×2): 10 mg via ORAL
  Administered 2019-09-15: 5 mg via ORAL
  Administered 2019-09-15 – 2019-09-18 (×6): 10 mg via ORAL
  Filled 2019-09-14 (×9): qty 2

## 2019-09-14 MED ORDER — ONDANSETRON HCL 4 MG/2ML IJ SOLN
4.0000 mg | Freq: Four times a day (QID) | INTRAMUSCULAR | Status: DC | PRN
  Administered 2019-09-15: 4 mg via INTRAVENOUS
  Filled 2019-09-14 (×2): qty 2

## 2019-09-14 MED ORDER — SODIUM CHLORIDE 0.9 % IV SOLN
INTRAVENOUS | Status: DC | PRN
  Administered 2019-09-14: 500 mL

## 2019-09-14 MED ORDER — SODIUM CHLORIDE 0.9 % IV SOLN: INTRAVENOUS | Status: DC | PRN

## 2019-09-14 MED ORDER — ALUM & MAG HYDROXIDE-SIMETH 200-200-20 MG/5ML PO SUSP
15.0000 mL | ORAL | Status: DC | PRN
  Administered 2019-09-14: 21:00:00 30 mL via ORAL
  Filled 2019-09-14: qty 30

## 2019-09-14 MED ORDER — DOCUSATE SODIUM 100 MG PO CAPS
100.0000 mg | ORAL_CAPSULE | Freq: Every day | ORAL | Status: DC
  Administered 2019-09-15 – 2019-09-19 (×3): 100 mg via ORAL
  Filled 2019-09-14 (×4): qty 1

## 2019-09-14 MED ORDER — FENTANYL CITRATE (PF) 250 MCG/5ML IJ SOLN
INTRAMUSCULAR | Status: DC | PRN
  Administered 2019-09-14: 50 ug via INTRAVENOUS
  Administered 2019-09-14: 100 ug via INTRAVENOUS
  Administered 2019-09-14 (×2): 50 ug via INTRAVENOUS

## 2019-09-14 MED ORDER — ACETAMINOPHEN 325 MG PO TABS
325.0000 mg | ORAL_TABLET | ORAL | Status: DC | PRN
  Administered 2019-09-17 – 2019-09-19 (×3): 650 mg via ORAL
  Filled 2019-09-14 (×4): qty 2

## 2019-09-14 MED ORDER — IODIXANOL 320 MG/ML IV SOLN
INTRAVENOUS | Status: DC | PRN
  Administered 2019-09-14: 11:00:00 100 mL via INTRAVENOUS

## 2019-09-14 MED ORDER — IOHEXOL 300 MG/ML  SOLN
80.0000 mL | Freq: Once | INTRAMUSCULAR | Status: AC | PRN
  Administered 2019-09-14: 22:00:00 80 mL via INTRAVENOUS

## 2019-09-14 MED ORDER — ONDANSETRON HCL 4 MG/2ML IJ SOLN
INTRAMUSCULAR | Status: DC | PRN
  Administered 2019-09-14: 4 mg via INTRAVENOUS

## 2019-09-14 MED ORDER — MORPHINE SULFATE (PF) 2 MG/ML IV SOLN
2.0000 mg | INTRAVENOUS | Status: DC | PRN
  Administered 2019-09-14: 4 mg via INTRAVENOUS
  Administered 2019-09-14: 2 mg via INTRAVENOUS
  Administered 2019-09-14: 5 mg via INTRAVENOUS
  Administered 2019-09-15: 2 mg via INTRAVENOUS
  Administered 2019-09-15: 3 mg via INTRAVENOUS
  Administered 2019-09-15: 2 mg via INTRAVENOUS
  Filled 2019-09-14: qty 1
  Filled 2019-09-14: qty 3
  Filled 2019-09-14 (×2): qty 1
  Filled 2019-09-14 (×2): qty 2
  Filled 2019-09-14: qty 1

## 2019-09-14 MED ORDER — ALBUMIN HUMAN 5 % IV SOLN: INTRAVENOUS | Status: DC | PRN

## 2019-09-14 MED ORDER — SODIUM CHLORIDE 0.9 % IV SOLN: INTRAVENOUS | Status: DC

## 2019-09-14 MED ORDER — LIDOCAINE 2% (20 MG/ML) 5 ML SYRINGE
INTRAMUSCULAR | Status: DC | PRN
  Administered 2019-09-14: 60 mg via INTRAVENOUS
  Administered 2019-09-14: 40 mg via INTRAVENOUS

## 2019-09-14 MED ORDER — PROMETHAZINE HCL 25 MG/ML IJ SOLN: 6.2500 mg | INTRAMUSCULAR | Status: DC | PRN

## 2019-09-14 MED ORDER — ACETAMINOPHEN 325 MG RE SUPP: 325.0000 mg | RECTAL | Status: DC | PRN

## 2019-09-14 MED ORDER — PHENOL 1.4 % MT LIQD: 1.0000 | OROMUCOSAL | Status: DC | PRN

## 2019-09-14 MED ORDER — HEPARIN BOLUS VIA INFUSION: 500.0000 [IU] | Freq: Once | INTRAVENOUS | Status: DC

## 2019-09-14 MED ORDER — DEXAMETHASONE SODIUM PHOSPHATE 10 MG/ML IJ SOLN
INTRAMUSCULAR | Status: DC | PRN
  Administered 2019-09-14: 10 mg via INTRAVENOUS

## 2019-09-14 MED ORDER — ZOLPIDEM TARTRATE 5 MG PO TABS: 5.0000 mg | ORAL_TABLET | Freq: Every evening | ORAL | Status: DC | PRN

## 2019-09-14 MED ORDER — GUAIFENESIN-DM 100-10 MG/5ML PO SYRP: 15.0000 mL | ORAL_SOLUTION | ORAL | Status: DC | PRN

## 2019-09-14 MED ORDER — ACETAMINOPHEN 500 MG PO TABS: 1000.0000 mg | ORAL_TABLET | Freq: Once | ORAL | Status: DC

## 2019-09-14 MED ORDER — METOPROLOL TARTRATE 5 MG/5ML IV SOLN: 2.0000 mg | INTRAVENOUS | Status: DC | PRN

## 2019-09-14 MED ORDER — CLOPIDOGREL BISULFATE 75 MG PO TABS
75.0000 mg | ORAL_TABLET | Freq: Every day | ORAL | Status: DC
  Administered 2019-09-15 – 2019-09-19 (×5): 75 mg via ORAL
  Filled 2019-09-14 (×5): qty 1

## 2019-09-14 MED ORDER — 0.9 % SODIUM CHLORIDE (POUR BTL) OPTIME
TOPICAL | Status: DC | PRN
  Administered 2019-09-14: 2000 mL

## 2019-09-14 MED ORDER — PHENYLEPHRINE HCL-NACL 10-0.9 MG/250ML-% IV SOLN
INTRAVENOUS | Status: DC | PRN
  Administered 2019-09-14: 25 ug/min via INTRAVENOUS

## 2019-09-14 MED ORDER — MAGNESIUM SULFATE 2 GM/50ML IV SOLN: 2.0000 g | Freq: Every day | INTRAVENOUS | Status: DC | PRN

## 2019-09-14 MED ORDER — HYDRALAZINE HCL 20 MG/ML IJ SOLN: 5.0000 mg | INTRAMUSCULAR | Status: DC | PRN

## 2019-09-14 MED ORDER — SENNOSIDES-DOCUSATE SODIUM 8.6-50 MG PO TABS: 1.0000 | ORAL_TABLET | Freq: Every evening | ORAL | Status: DC | PRN

## 2019-09-14 MED ORDER — FENTANYL CITRATE (PF) 100 MCG/2ML IJ SOLN
25.0000 ug | INTRAMUSCULAR | Status: DC | PRN
  Administered 2019-09-14 (×2): 25 ug via INTRAVENOUS

## 2019-09-14 MED ORDER — ROCURONIUM BROMIDE 10 MG/ML (PF) SYRINGE
PREFILLED_SYRINGE | INTRAVENOUS | Status: DC | PRN
  Administered 2019-09-14: 20 mg via INTRAVENOUS
  Administered 2019-09-14: 50 mg via INTRAVENOUS
  Administered 2019-09-14: 20 mg via INTRAVENOUS
  Administered 2019-09-14 (×2): 30 mg via INTRAVENOUS

## 2019-09-14 MED ORDER — HEMOSTATIC AGENTS (NO CHARGE) OPTIME
TOPICAL | Status: DC | PRN
  Administered 2019-09-14: 1 via TOPICAL

## 2019-09-14 SURGICAL SUPPLY — 79 items
BALLN MUSTANG 6X80X75 (BALLOONS) ×3
BALLOON MUSTANG 6X80X75 (BALLOONS) IMPLANT
BANDAGE ESMARK 6X9 LF (GAUZE/BANDAGES/DRESSINGS) IMPLANT
BNDG ESMARK 6X9 LF (GAUZE/BANDAGES/DRESSINGS)
CANISTER SUCT 3000ML PPV (MISCELLANEOUS) ×3 IMPLANT
CANNULA VESSEL 3MM 2 BLNT TIP (CANNULA) IMPLANT
CATH ANGIO 5F BER 65CM (CATHETERS) ×1 IMPLANT
CATH EMB 4FR 80CM (CATHETERS) ×1 IMPLANT
CLIP VESOCCLUDE MED 24/CT (CLIP) ×3 IMPLANT
CLIP VESOCCLUDE SM WIDE 24/CT (CLIP) ×3 IMPLANT
COVER WAND RF STERILE (DRAPES) ×3 IMPLANT
CUFF TOURN SGL QUICK 24 (TOURNIQUET CUFF)
CUFF TOURN SGL QUICK 34 (TOURNIQUET CUFF)
CUFF TOURN SGL QUICK 42 (TOURNIQUET CUFF) IMPLANT
CUFF TRNQT CYL 24X4X16.5-23 (TOURNIQUET CUFF) IMPLANT
CUFF TRNQT CYL 34X4.125X (TOURNIQUET CUFF) IMPLANT
DERMABOND ADVANCED (GAUZE/BANDAGES/DRESSINGS) ×6
DERMABOND ADVANCED .7 DNX12 (GAUZE/BANDAGES/DRESSINGS) ×2 IMPLANT
DRAIN CHANNEL 15F RND FF W/TCR (WOUND CARE) IMPLANT
DRAPE C-ARM 42X72 X-RAY (DRAPES) ×1 IMPLANT
DRAPE HALF SHEET 40X57 (DRAPES) IMPLANT
DRAPE X-RAY CASS 24X20 (DRAPES) IMPLANT
ELECT REM PT RETURN 9FT ADLT (ELECTROSURGICAL) ×3
ELECTRODE REM PT RTRN 9FT ADLT (ELECTROSURGICAL) ×2 IMPLANT
EVACUATOR SILICONE 100CC (DRAIN) IMPLANT
GLIDEWIRE ADV .035X260CM (WIRE) ×1 IMPLANT
GLOVE BIO SURGEON STRL SZ 6.5 (GLOVE) ×3 IMPLANT
GLOVE BIO SURGEON STRL SZ7 (GLOVE) ×1 IMPLANT
GLOVE BIO SURGEON STRL SZ7.5 (GLOVE) ×6 IMPLANT
GLOVE BIOGEL PI IND STRL 6.5 (GLOVE) IMPLANT
GLOVE BIOGEL PI IND STRL 7.0 (GLOVE) IMPLANT
GLOVE BIOGEL PI IND STRL 7.5 (GLOVE) IMPLANT
GLOVE BIOGEL PI IND STRL 8 (GLOVE) IMPLANT
GLOVE BIOGEL PI INDICATOR 6.5 (GLOVE) ×2
GLOVE BIOGEL PI INDICATOR 7.0 (GLOVE) ×7
GLOVE BIOGEL PI INDICATOR 7.5 (GLOVE) ×1
GLOVE BIOGEL PI INDICATOR 8 (GLOVE) ×1
GLOVE ECLIPSE 6.5 STRL STRAW (GLOVE) ×1 IMPLANT
GLOVE SURG SS PI 6.5 STRL IVOR (GLOVE) ×2 IMPLANT
GOWN STRL REUS W/ TWL LRG LVL3 (GOWN DISPOSABLE) ×4 IMPLANT
GOWN STRL REUS W/ TWL XL LVL3 (GOWN DISPOSABLE) ×2 IMPLANT
GOWN STRL REUS W/TWL LRG LVL3 (GOWN DISPOSABLE) ×7
GOWN STRL REUS W/TWL XL LVL3 (GOWN DISPOSABLE) ×1
GRAFT PROPATEN W/RING 6X80X60 (Vascular Products) ×1 IMPLANT
HEMOSTAT SNOW SURGICEL 2X4 (HEMOSTASIS) ×1 IMPLANT
INSERT FOGARTY SM (MISCELLANEOUS) IMPLANT
KIT BASIN OR (CUSTOM PROCEDURE TRAY) ×3 IMPLANT
KIT ENCORE 26 ADVANTAGE (KITS) ×1 IMPLANT
KIT TURNOVER KIT B (KITS) ×3 IMPLANT
MARKER GRAFT CORONARY BYPASS (MISCELLANEOUS) IMPLANT
NS IRRIG 1000ML POUR BTL (IV SOLUTION) ×6 IMPLANT
PACK PERIPHERAL VASCULAR (CUSTOM PROCEDURE TRAY) ×3 IMPLANT
PAD ARMBOARD 7.5X6 YLW CONV (MISCELLANEOUS) ×6 IMPLANT
SET COLLECT BLD 21X3/4 12 (NEEDLE) IMPLANT
SET MICROPUNCTURE 5F STIFF (MISCELLANEOUS) ×1 IMPLANT
SHEATH PINNACLE 6F 10CM (SHEATH) ×1 IMPLANT
SPONGE LAP 18X18 X RAY DECT (DISPOSABLE) ×1 IMPLANT
STENT ELUVIA 6X120X130 (Permanent Stent) ×1 IMPLANT
STOPCOCK 4 WAY LG BORE MALE ST (IV SETS) ×1 IMPLANT
SUT ETHILON 3 0 PS 1 (SUTURE) IMPLANT
SUT MNCRL AB 4-0 PS2 18 (SUTURE) ×10 IMPLANT
SUT PROLENE 5 0 C 1 24 (SUTURE) ×5 IMPLANT
SUT PROLENE 6 0 BV (SUTURE) ×7 IMPLANT
SUT PROLENE 7 0 BV 1 (SUTURE) IMPLANT
SUT SILK 2 0 SH (SUTURE) ×3 IMPLANT
SUT SILK 3 0 (SUTURE) ×2
SUT SILK 3-0 18XBRD TIE 12 (SUTURE) IMPLANT
SUT VIC AB 2-0 CT1 27 (SUTURE) ×2
SUT VIC AB 2-0 CT1 TAPERPNT 27 (SUTURE) ×4 IMPLANT
SUT VIC AB 3-0 SH 27 (SUTURE) ×5
SUT VIC AB 3-0 SH 27X BRD (SUTURE) ×4 IMPLANT
SYR 30ML LL (SYRINGE) ×1 IMPLANT
SYR 3ML LL SCALE MARK (SYRINGE) ×1 IMPLANT
SYR TB 1ML LUER SLIP (SYRINGE) ×1 IMPLANT
TOWEL GREEN STERILE (TOWEL DISPOSABLE) ×3 IMPLANT
TRAY FOLEY MTR SLVR 16FR STAT (SET/KITS/TRAYS/PACK) ×3 IMPLANT
TUBING EXTENTION W/L.L. (IV SETS) ×1 IMPLANT
UNDERPAD 30X30 (UNDERPADS AND DIAPERS) ×3 IMPLANT
WATER STERILE IRR 1000ML POUR (IV SOLUTION) ×3 IMPLANT

## 2019-09-14 NOTE — Progress Notes (Signed)
Pt received from OR. Doppler pulses. VSS. Call bell in reach. Will continue to monitor.  Arletta Bale, RN

## 2019-09-14 NOTE — Progress Notes (Signed)
VASCULAR SURGERY:  Notified of this hematoma in the right groin.  On exam he has moderate swelling at the proximal tunnel site in the right groin.  Right foot is warm and well-perfused.  No need for urgent exploration.  Deitra Mayo, MD Office: [phone]

## 2019-09-14 NOTE — Anesthesia Postprocedure Evaluation (Signed)
Anesthesia Post Note  Patient: Ryan [name]  Procedure(s) Performed: BYPASS GRAFT FEMORAL-POPLITEAL ARTERY (Right Leg Lower) Insertion Of Common and External Iliac Stent (Right Abdomen)     Patient location during evaluation: PACU Anesthesia Type: General Level of consciousness: awake and alert Pain management: pain level controlled Vital Signs Assessment: post-procedure vital signs reviewed and stable Respiratory status: spontaneous breathing, nonlabored ventilation, respiratory function stable and patient connected to nasal cannula oxygen Cardiovascular status: blood pressure returned to baseline and stable Postop Assessment: no apparent nausea or vomiting Anesthetic complications: no    Last Vitals:  Vitals:   09/14/19 1330 09/14/19 1344  BP: 126/84 125/89  Pulse: 86 87  Resp: 13 17  Temp: 36.4 C 36.4 C  SpO2: 95% 97%    Last Pain:  Vitals:   09/14/19 1344  TempSrc: Axillary  PainSc:                  [name]

## 2019-09-14 NOTE — Progress Notes (Addendum)
VASCULAR SURGERY:  The patient got up to go to the bathroom and strained and developed sudden onset of right flank pain.  On exam he does have a hematoma in the right groin which appears slightly larger than earlier.  I have ordered a stat CT of the abdomen and pelvis.   The patient underwent extensive iliofemoral endarterectomy and vein patch angioplasty on the right in addition to proximal stent placement.  Addendum: I have reviewed the CT scan I do not see any significant retroperitoneal hemorrhage or other obvious explanation for his right flank pain.  Will follow.  Deitra Mayo, MD Office: [phone]

## 2019-09-14 NOTE — Progress Notes (Signed)
  Progress Note    09/14/2019 7:16 AM Day of Surgery  Subjective:  No overnight issues, having pain in right lower leg  Vitals:   09/13/19 2349 09/14/19 0311  BP: 103/82 123/87  Pulse: 69 70  Resp: 15 13  Temp: 97.7 F (36.5 C) 97.7 F (36.5 C)  SpO2: 98% 99%    Physical Exam: [id number] Non labored respirations Right lower extremity no palpable pulses, no wounds, right foot is ttp Right lower gsv is visible  CBC    Component Value Date/Time   WBC 4.9 09/13/2019 1801   RBC 3.41 (L) 09/13/2019 1801   HGB 9.8 (L) 09/13/2019 1801   HGB 13.1 08/31/2015 0929   HCT 30.4 (L) 09/13/2019 1801   HCT 38.5 08/31/2015 0929   PLT 238 09/13/2019 1801   PLT 217 08/31/2015 0929   MCV 89.1 09/13/2019 1801   MCV 93 08/31/2015 0929   MCH 28.7 09/13/2019 1801   MCHC 32.2 09/13/2019 1801   RDW 14.7 09/13/2019 1801   RDW 12.8 08/31/2015 0929   LYMPHSABS 1,376 11/20/2018 1601   LYMPHSABS 1.5 08/31/2015 0929   EOSABS 112 11/20/2018 1601   EOSABS 0.1 08/31/2015 0929   BASOSABS 37 11/20/2018 1601   BASOSABS 0.0 08/31/2015 0929    BMET    Component Value Date/Time   NA 136 09/13/2019 2244   NA 137 08/31/2015 0929   K 4.5 09/13/2019 2244   CL 108 09/13/2019 2244   CO2 20 (L) 09/13/2019 2244   GLUCOSE 143 (H) 09/13/2019 2244   BUN 25 (H) 09/13/2019 2244   BUN 11 08/31/2015 0929   CREATININE 2.30 (H) 09/13/2019 2244   CREATININE 1.67 (H) 11/20/2018 1601   CALCIUM 8.1 (L) 09/13/2019 2244   GFRNONAA 29 (L) 09/13/2019 2244   GFRNONAA 44 (L) 11/20/2018 1601   GFRAA 34 (L) 09/13/2019 2244   GFRAA 50 (L) 11/20/2018 1601    INR    Component Value Date/Time   INR 1.2 09/13/2019 2244     Intake/Output Summary (Last 24 hours) at 09/14/2019 0716 Last data filed at 09/14/2019 0400 Gross per 24 hour  Intake 306.83 ml  Output 700 ml  Net -393.17 ml     Assessment/plan:  64 y.o. male is here with critical right lower extremity ischemia with rest pain.  Angiography yesterday with  minimal distal target.  We will plan for exploration of distal target and possible harvest of greater saphenous vein on the vein mapping did not appear suitable by physical exam he does appear to have a saphenous vein that is visible just beneath the skin.  Common femoral pulses minimally palpable may require endarterectomy or retrograde stenting.  Patient is at high risk for right lower extremity amputation and he understands this.    Telicia Hodgkiss C. [name], MD [facility] [place] Office: [phone] Pager: [phone]  09/14/2019 7:16 AM

## 2019-09-14 NOTE — Progress Notes (Signed)
Called PA Corrie to make aware that patient has developed hematoma to right groin incision. Patient has brisk dopplerable pulses in DP and DT. Right leg is warm to touch. PA will follow up with Dr. Scot Dock. Pt resting with call bell within reach.  Will continue to monitor.

## 2019-09-14 NOTE — Op Note (Signed)
Patient name: Ryan [name] MRN: [id number] DOB: 29-Oct-1955 Sex: male  09/14/2019 Pre-operative Diagnosis: critical right lower extremity ischemia with rest pain Post-operative diagnosis:  Same Surgeon:  Eda Paschal. [name], MD Assistants: Gae Gallop, MD; Karoline Caldwell, [place] Procedure Performed: 1.  Harvest of right greater saphenous vein 2.  Extensive Right external iliac, common femoral and profunda femoral endarterectomy with vein patch angioplasty 3.  Right common femoral to anterior tibial artery bypass with composite 28mm ringed ptfe and reversed greater saphenous vein distally 4.  Right common and external iliac artery stenting with 6 x 110mm Elluvia 5.  Limited right lower extremity angiogram  Indications: 64yo male with history of right lower extremity stenting of his SFA and popliteal as well as posterior tibial artery with covered stents as well as multiple leg therapy cases in the recent past.  He now has critical right lower extremity ischemia with rest pain ABIs at 0.2 with duplex that did not demonstrate any flow in his peroneal or posterior tibial artery distally he also has a toe pressure of 0.  He is undergone angiography the day prior to this which demonstrated his primary runoff to be very diminutive anterior tibial artery peroneal artery also appear patent although the flow to the foot was underwhelming.  Right common femoral artery also appeared to be disease extending up into the right external iliac artery.  Findings: Patient had minimal pulsation in the right common femoral artery.  On open exploration of this was severely thickened.  Endarterectomy was carried to the external leg arteries or under the inguinal ligament has been arranged.  Ultimately we had to stent to develop inflow all the way from the common iliac artery on the right to just approximately 2 cm cephalad of the humeral head and the external iliac artery on the right.  The vein was marginal for use  although we did harvest the entirety of the greater saphenous vein it was not usable after dilating.  We did use one segment for vein patch angioplasty of our common femoral endarterectomy and another segment 4 composite graft was approximately 10 cm of vein used for the distal anastomosis.  After endarterectomy and bypass we had very weak inflow.  With stenting we develop strong inflow and good signal at the foot and anterior tibial artery to the dorsalis pedis artery.  At completion angiography did not demonstrate any significant flow into the foot however with a very strong signal that was absent with compression of the graft.    Procedure:  The patient was identified in the holding area and taken to the operating room where he was placed supine operative table and general anesthesia was induced.  He was sterilely prepped and draped in the right lower extremity in the usual fashion, antibiotics were administered and a timeout was called.  We used ultrasound to identify the saphenous vein throughout his right lower extremity.  He was quite diminutive in the distal thigh and below the knee there was 1 area below the knee that appeared suitable for use.  We also identified the common femoral artery as there was not much of a pulse there and it did appear quite thickened by ultrasound.  We made a transverse incision between the saphenofemoral junction and identified common femoral artery.  We dissected down the common femoral artery.  There was very minimal pulsation here.  Dissected out the superficial femoral artery as well as the profunda.  We dissected up onto the inguinal ligament we  divided the crossing vein.  Several centimeters of the inguinal ligament we did feel pulsation.  We placed a vessel loop around this.  We isolated multiple side branches of the common femoral artery.  Through the same incision we then identified our greater saphenous vein dissected this out for several centimeters dividing  branches between ties back to the saphenofemoral junction.  This time we turned our attention to the the distal target.  A longitudinal incision was made over the anterior compartment of the lower leg approximately two thirds down the distal aspect of the leg.  We separated the anterior vessels identified the neurovascular bundle.  I did divide one of the paired veins between ties.  The artery was heavily calcified and I did not think it would be suitable for clamping.  At this time we turned our attention to the medial leg.  We made several skip incisions dividing out the vein taking branches between clips and ties.  Distally in the leg there were multiple branches became quite diminutive we tied off and divided.  At the saphenofemoral junction we placed a side-biting clamp and divided the vein.  We oversewed the saphenofemoral junction with 5-0 Prolene suture in a running mattress fashion.  We then attempted to dilate the vein.  Unfortunately it did not appear the vein would be suitable for bypass at this time.  We then passed a tunneler from the lateral incision of the leg subcutaneously up to the level of the common femoral artery the patient was fully heparinized.  We clamped the profunda followed by the external iliac artery high.  We then performed a longitudinal arteriotomy.  The artery was subtotally occluded there we performed significant endarterectomy of what was really thickened hyperplastic tissue.  In the SFA I removed the most proximal stent as it was sticking into the common femoral artery.  I only had minimal backbleeding from the SFA.  I did have strong backbleeding from the profunda.  I performed endarterectomy high up under the inguinal ligament.  Unfortunate I could not establish good antegrade bleeding.  With this I elected to perform vein patch angioplasty.  I removed approximately 8 cm of healthy-appearing vein and opened it longitudinally.  I then sewed in place with 5-0 Prolene suture.  I  completed the anastomosis flushing all directions.  Upon completion I had minimal pulsatility.  I elected to proceed with bypass.  I tunneled a 6 mm ringed PTFE graft laterally and clamped it distally.  I spatulated.  I clamped my profunda as well as common femoral artery opened the vein patch vertically.  I sewed the graft end-to-side with 6-0 Prolene suture.  Upon completion of flushing the graft and had very minimal pulsatility at the level of the ankle.  I then flushed my vein again and established approximately 10 cm segment that would be usable.  This was removed.  I flushed with heparinized saline.  I placed it distally to the segment of the bypass and then trimmed my graft to size.  I spatulated both the vein and the graft incentive end-to-end with 6-0 Prolene suture.  I then flushed blood through both and reclamped the graft after irrigating with heparinized saline.  I then placed a clamp on the proximal anterior tibial artery and opened it.  There was pretty significant antegrade bleeding wanted to clamp off.  There was minimal retrograde bleeding.  I serially dilated with a 1.5 followed by 2 and then 2.5 dilator.  I left the 2.5 dilator  in place for hemostasis.  I then spatulated my vein and sewed end-to-side with 6-0 Prolene suture.  Prior to completion I removed my dilator.  I also allowed flushing all directions and irrigated with heparinized saline.  Upon completion there was very weak flow distally as the inflow was still weak proximally.  We did have to redose heparin twice during this case it should be noted.  I then turned my attention towards the inflow.  I cannulated the vein patch in the common femoral artery with micropuncture needle followed the wire sheath.  The wire would not pass easily.  I performed retrograde angiography.  Unfortunately there was no retrograde flow very high.  I used a Glidewire advantage I was able to do steer this into a true luminal pathway.  I then placed a 6 French  sheath followed by very catheter and confirmed intraluminal access with angiography.  I elected to primarily stent with a 6 mm drug-eluting stent.   I now had very strong inflow.  I removed my catheter and wire.Completion demonstrated no residual stenosis.  Also performed a limited right lower extremity angiography I did not have any flow but I have very good signal in the anterior tibial artery distally.  I elected that this was much could be done to improve the patient's lower extremity blood flow.  This was postdilated with a 6 mm balloon.   I suture-ligated the sheath site with 5-0 Prolene suture removed the sheath and closed it.  I obtain hemostasis.  I did not reverse heparinization with plans to restart heparin 500 units/h and 2 hours.  We irrigated our wounds and closed in layers of Vicryl Monocryl.  Patient was awake from anesthesia having tolerated procedure without any complication.  Counts were correct at completion.  Contrast: 60cc  EBL: 500cc  Aliea Bobe C. [name], MD [facility] Office: [phone] Pager: [phone]

## 2019-09-14 NOTE — Anesthesia Procedure Notes (Signed)
Procedure Name: Intubation Date/Time: 09/14/2019 7:47 AM Performed by: Mariea Clonts, CRNA Pre-anesthesia Checklist: Patient identified, Emergency Drugs available, Suction available and Patient being monitored Patient Re-evaluated:Patient Re-evaluated prior to induction Oxygen Delivery Method: Circle System Utilized Preoxygenation: Pre-oxygenation with 100% oxygen Induction Type: IV induction Ventilation: Mask ventilation without difficulty and Oral airway inserted - appropriate to patient size Laryngoscope Size: Sabra Heck and 2 Grade View: Grade I Tube type: Oral Tube size: 7.5 mm Number of attempts: 1 Airway Equipment and Method: Stylet and Oral airway Placement Confirmation: ETT inserted through vocal cords under direct vision,  positive ETCO2 and breath sounds checked- equal and bilateral Tube secured with: Tape Dental Injury: Teeth and Oropharynx as per pre-operative assessment

## 2019-09-14 NOTE — Progress Notes (Signed)
  Progress Note    09/14/2019 3:25 PM Day of Surgery  Subjective:  states tolerable pain in right lower leg. Just uncomfortable in the bed and complaining of very dry mouth   Vitals:   09/14/19 1330 09/14/19 1344  BP: 126/84 125/89  Pulse: 86 87  Resp: 13 17  Temp: 97.6 F (36.4 C) 97.6 F (36.4 C)  SpO2: 95% 97%   Physical Exam: General: well appearing, slightly drowsy post op, not in any distress Lungs:  Non labored Incisions:  Right lower extremity incisions clean, dry and intact. No hematomas. No bleeding or drainage Extremities:  2+ right femoral pulse. Palpable pulse in right lower extremity bypass graft. Brisk Dopper PT/ DP/Peroneal. Right foot warm. Motor and sensory intact. Abdomen:  Soft, non tender Neurologic: Alert and oriented  CBC    Component Value Date/Time   WBC 4.9 09/13/2019 1801   RBC 3.41 (L) 09/13/2019 1801   HGB 9.2 (L) 09/14/2019 0754   HGB 13.1 08/31/2015 0929   HCT 27.0 (L) 09/14/2019 0754   HCT 38.5 08/31/2015 0929   PLT 238 09/13/2019 1801   PLT 217 08/31/2015 0929   MCV 89.1 09/13/2019 1801   MCV 93 08/31/2015 0929   MCH 28.7 09/13/2019 1801   MCHC 32.2 09/13/2019 1801   RDW 14.7 09/13/2019 1801   RDW 12.8 08/31/2015 0929   LYMPHSABS 1,376 11/20/2018 1601   LYMPHSABS 1.5 08/31/2015 0929   EOSABS 112 11/20/2018 1601   EOSABS 0.1 08/31/2015 0929   BASOSABS 37 11/20/2018 1601   BASOSABS 0.0 08/31/2015 0929    BMET    Component Value Date/Time   NA 138 09/14/2019 0754   NA 137 08/31/2015 0929   K 4.4 09/14/2019 0754   CL 110 09/14/2019 0754   CO2 20 (L) 09/13/2019 2244   GLUCOSE 91 09/14/2019 0754   BUN 27 (H) 09/14/2019 0754   BUN 11 08/31/2015 0929   CREATININE 2.60 (H) 09/14/2019 0754   CREATININE 1.67 (H) 11/20/2018 1601   CALCIUM 8.1 (L) 09/13/2019 2244   GFRNONAA 29 (L) 09/13/2019 2244   GFRNONAA 44 (L) 11/20/2018 1601   GFRAA 34 (L) 09/13/2019 2244   GFRAA 50 (L) 11/20/2018 1601    INR    Component Value  Date/Time   INR 1.2 09/13/2019 2244     Intake/Output Summary (Last 24 hours) at 09/14/2019 1525 Last data filed at 09/14/2019 1338 Gross per 24 hour  Intake 3181.83 ml  Output 1685 ml  Net 1496.83 ml     Assessment/Plan:  64 y.o. male is s/p 1.  Harvest of right greater saphenous vein 2.  Extensive Right external iliac, common femoral and profunda femoral endarterectomy with vein patch angioplasty 3.  Right common femoral to anterior tibial artery bypass with composite 45mm ringed ptfe and reversed greater saphenous vein distally 4.  Right common and external iliac artery stenting with 6 x 166mm Elluvia 5.  Limited right lower extremity angiogram For critical limb ischemia and rest pain Day of surgery  Doing well post op with good perfusion of right lower extremity. Brisk Doppler PT/DP/Pero signals. Will continue to monitor Cr this evening and also am labs tomorrow. Advance diet this evening. Pain control. PT/OT tomorrow with increased mobilization.   DVT prophylaxis:  Heparin gtt   Karoline Caldwell, PA-C [facility] [phone] 09/14/2019 3:25 PM

## 2019-09-14 NOTE — Transfer of Care (Signed)
Immediate Anesthesia Transfer of Care Note  Patient: Ryan [name]  Procedure(s) Performed: BYPASS GRAFT FEMORAL-POPLITEAL ARTERY (Right Leg Lower) Insertion Of Common and External Iliac Stent (Right Abdomen)  Patient Location: PACU  Anesthesia Type:General  Level of Consciousness: awake, alert  and oriented  Airway & Oxygen Therapy: Patient Spontanous Breathing and Patient connected to nasal cannula oxygen  Post-op Assessment: Report given to RN, Post -op Vital signs reviewed and stable and Patient moving all extremities X 4  Post vital signs: Reviewed and stable  Last Vitals:  Vitals Value Taken Time  BP 143/93 09/14/19 1259  Temp    Pulse 96 09/14/19 1301  Resp 13 09/14/19 1301  SpO2 100 % 09/14/19 1301  Vitals shown include unvalidated device data.  Last Pain:  Vitals:   09/14/19 0525  TempSrc:   PainSc: Asleep      Patients Stated Pain Goal: 0 ([id number])  Complications: No apparent anesthesia complications

## 2019-09-14 NOTE — Anesthesia Preprocedure Evaluation (Signed)
Anesthesia Evaluation  Patient identified by MRN, date of birth, ID band Patient awake    Reviewed: Allergy & Precautions, NPO status , Patient's Chart, lab work & pertinent test results  Airway Mallampati: II  TM Distance: >3 FB Neck ROM: Full    Dental  (+) Dental Advisory Given   Pulmonary COPD, former smoker,    breath sounds clear to auscultation       Cardiovascular hypertension, + Peripheral Vascular Disease   Rhythm:Regular Rate:Normal     Neuro/Psych negative neurological ROS     GI/Hepatic negative GI ROS, Neg liver ROS,   Endo/Other  negative endocrine ROS  Renal/GU Renal InsufficiencyRenal disease     Musculoskeletal   Abdominal   Peds  Hematology  (+) anemia ,   Anesthesia Other Findings   Reproductive/Obstetrics                             Lab Results  Component Value Date   WBC 4.9 09/13/2019   HGB 9.8 (L) 09/13/2019   HCT 30.4 (L) 09/13/2019   MCV 89.1 09/13/2019   PLT 238 09/13/2019   Lab Results  Component Value Date   CREATININE 2.30 (H) 09/13/2019   BUN 25 (H) 09/13/2019   NA 136 09/13/2019   K 4.5 09/13/2019   CL 108 09/13/2019   CO2 20 (L) 09/13/2019    Anesthesia Physical Anesthesia Plan  ASA: III  Anesthesia Plan: General   Post-op Pain Management:    Induction: Intravenous  PONV Risk Score and Plan: 2 and Dexamethasone and Ondansetron  Airway Management Planned: Oral ETT  Additional Equipment:   Intra-op Plan:   Post-operative Plan: Extubation in OR  Informed Consent: I have reviewed the patients History and Physical, chart, labs and discussed the procedure including the risks, benefits and alternatives for the proposed anesthesia with the patient or authorized representative who has indicated his/her understanding and acceptance.     Dental advisory given  Plan Discussed with: CRNA  Anesthesia Plan Comments:          Anesthesia Quick Evaluation

## 2019-09-15 ENCOUNTER — Encounter: Payer: Self-pay | Admitting: *Deleted

## 2019-09-15 ENCOUNTER — Encounter (HOSPITAL_COMMUNITY): Payer: BLUE CROSS/BLUE SHIELD

## 2019-09-15 LAB — CBC
HCT: 19.6 % — ABNORMAL LOW (ref 39.0–52.0)
Hemoglobin: 6.1 g/dL — CL (ref 13.0–17.0)
MCH: 28.9 pg (ref 26.0–34.0)
MCHC: 31.1 g/dL (ref 30.0–36.0)
MCV: 92.9 fL (ref 80.0–100.0)
Platelets: 200 10*3/uL (ref 150–400)
RBC: 2.11 MIL/uL — ABNORMAL LOW (ref 4.22–5.81)
RDW: 15.2 % (ref 11.5–15.5)
WBC: 4.7 10*3/uL (ref 4.0–10.5)
nRBC: 0 % (ref 0.0–0.2)

## 2019-09-15 LAB — BASIC METABOLIC PANEL
Anion gap: 11 (ref 5–15)
BUN: 27 mg/dL — ABNORMAL HIGH (ref 8–23)
CO2: 17 mmol/L — ABNORMAL LOW (ref 22–32)
Calcium: 7.1 mg/dL — ABNORMAL LOW (ref 8.9–10.3)
Chloride: 113 mmol/L — ABNORMAL HIGH (ref 98–111)
Creatinine, Ser: 2.74 mg/dL — ABNORMAL HIGH (ref 0.61–1.24)
GFR calc Af Amer: 28 mL/min — ABNORMAL LOW (ref >60)
GFR calc non Af Amer: 24 mL/min — ABNORMAL LOW (ref >60)
Glucose, Bld: 128 mg/dL — ABNORMAL HIGH (ref 70–99)
Potassium: 4.9 mmol/L (ref 3.5–5.1)
Sodium: 141 mmol/L (ref 135–145)

## 2019-09-15 LAB — PREPARE RBC (CROSSMATCH)

## 2019-09-15 LAB — HEMOGLOBIN AND HEMATOCRIT, BLOOD
HCT: 29.9 % — ABNORMAL LOW (ref 39.0–52.0)
Hemoglobin: 9.4 g/dL — ABNORMAL LOW (ref 13.0–17.0)

## 2019-09-15 MED ORDER — SODIUM CHLORIDE 0.9% IV SOLUTION: Freq: Once | INTRAVENOUS | Status: AC

## 2019-09-15 MED ORDER — BISACODYL 10 MG RE SUPP
10.0000 mg | Freq: Once | RECTAL | Status: AC
  Administered 2019-09-15: 11:00:00 10 mg via RECTAL
  Filled 2019-09-15: qty 1

## 2019-09-15 MED ORDER — POLYETHYLENE GLYCOL 3350 17 G PO PACK
17.0000 g | PACK | Freq: Every day | ORAL | Status: DC
  Administered 2019-09-15 – 2019-09-17 (×2): 17 g via ORAL
  Filled 2019-09-15 (×3): qty 1

## 2019-09-15 NOTE — Progress Notes (Signed)
Spoke with Pt's daughter by phone. Updated given, all questions answered. She expressed appreciations. She requested Dr.Cain please call her for update tomorrow around 10:30 am -12:00 pm.   Pt is alert an oriented x 4, able to ambulate to bathroom and bedside commode with one assistance and walker. Pain tolerated well, Oxycodone given PRN. Remains afebrile, BP within normal limits, HR 120 when ambulated, at rest HR 80s-90s, NSR on monitor, SPO2 95-100% on room air.   Right leg surgical wound dry and clean and swelling, right groin hematoma with no active bleeding present. Good doppler signals on right DP and PT pulses.  Check Hb 9.4, Hct 29.9% after 2 units of PRBC today per RN day shift reported.  No immediate distress. Will continue to monitor.  Kennyth Lose, RN

## 2019-09-15 NOTE — Progress Notes (Addendum)
Progress Note    09/15/2019 8:37 AM 1 Day Post-Op  Subjective:  Says he is a little sore but not having pain.  Has not had a BM since Monday but feels like he needs to go  Afebrile HR 90's-100's  123456 systolic [id number] RA  Vitals:   09/15/19 0741 09/15/19 0811  BP: 114/61 98/62  Pulse: 97 100  Resp: (!) 21 (!) 22  Temp: 97.6 F (36.4 C) (!) 97.3 F (36.3 C)  SpO2: 97%     Physical Exam: Cardiac:  regular Lungs:  Non labored Incisions:  All incisions look good; there is hematoma right groin Extremities:  Brisk right peroneal >DP doppler signal Abdomen:  Firm but non tender to palpation.  Right flank non tender to palpation.   CBC    Component Value Date/Time   WBC 4.7 09/15/2019 0526   RBC 2.11 (L) 09/15/2019 0526   HGB 6.1 (LL) 09/15/2019 0526   HGB 13.1 08/31/2015 0929   HCT 19.6 (L) 09/15/2019 0526   HCT 38.5 08/31/2015 0929   PLT 200 09/15/2019 0526   PLT 217 08/31/2015 0929   MCV 92.9 09/15/2019 0526   MCV 93 08/31/2015 0929   MCH 28.9 09/15/2019 0526   MCHC 31.1 09/15/2019 0526   RDW 15.2 09/15/2019 0526   RDW 12.8 08/31/2015 0929   LYMPHSABS 1,376 11/20/2018 1601   LYMPHSABS 1.5 08/31/2015 0929   EOSABS 112 11/20/2018 1601   EOSABS 0.1 08/31/2015 0929   BASOSABS 37 11/20/2018 1601   BASOSABS 0.0 08/31/2015 0929    BMET    Component Value Date/Time   NA 141 09/15/2019 0302   NA 137 08/31/2015 0929   K 4.9 09/15/2019 0302   CL 113 (H) 09/15/2019 0302   CO2 17 (L) 09/15/2019 0302   GLUCOSE 128 (H) 09/15/2019 0302   BUN 27 (H) 09/15/2019 0302   BUN 11 08/31/2015 0929   CREATININE 2.74 (H) 09/15/2019 0302   CREATININE 1.67 (H) 11/20/2018 1601   CALCIUM 7.1 (L) 09/15/2019 0302   GFRNONAA 24 (L) 09/15/2019 0302   GFRNONAA 44 (L) 11/20/2018 1601   GFRAA 28 (L) 09/15/2019 0302   GFRAA 50 (L) 11/20/2018 1601    INR    Component Value Date/Time   INR 1.2 09/13/2019 2244     Intake/Output Summary (Last 24 hours) at 09/15/2019 0837 Last  data filed at 09/15/2019 0400 Gross per 24 hour  Intake 4892.31 ml  Output 1455 ml  Net 3437.31 ml     Assessment:  64 y.o. male is s/p:  1.  Harvest of right greater saphenous vein 2.  Extensive Right external iliac, common femoral and profunda femoral endarterectomy with vein patch angioplasty 3.  Right common femoral to anterior tibial artery bypass with composite 9mm ringed ptfe and reversed greater saphenous vein distally 4.  Right common and external iliac artery stenting with 6 x 157mm Elluvia 5.  Limited right lower extremity angiogram  1 Day Post-Op   Plan: -pt with patent bypass with brisk peroneal>DP. -acute surgical blood loss anemia-pt receiving 2 units PRBC's -pt c/o back pain-pt evaluated by Dr. Scot Dock last evening and pt with hematoma right groin.  CT ordered and diffuse hematoma suspected and associated with bypass surgery.  No active bleeding present.   -pt with elevated creatinine of 2.7 today.  Has hx of elevated creatinine in the past.  Pt did receive contrast intraop for arteriogram.  Continue IV hydration.  Labs in am.  -DVT prophylaxis:  SCD's for now  given acute blood loss.  -pt abdomen a little firm-has not had BM and not tender to palpation.  Will give dulcolax supp and pt is in agreement.  -face to face and [place] orders placed and consult to case management.   Leontine Locket, PA-C [facility] [phone] 09/15/2019 8:37 AM  I have independently interviewed and examined patient and agree with PA assessment and plan above.  Does not appear to be expanding hematoma which is mostly lateral tunnel.  We will follow CBCs.  Currently on aspirin Plavix will need Plavix and anticoagulation long-term but will hold on anticoagulation given acute blood loss anemia.  Transfusing 2 units today.  Continue Foley catheter and check creatinine tomorrow.  Out of bed as tolerated.  Brandon C. [name], MD [facility] [place] Office:  [phone] Pager: [phone]

## 2019-09-15 NOTE — Progress Notes (Signed)
Pt sat up on [facility] for nearly an hour and unable move bowels.  PT assisted up to ambulate.  Walked to the room door and back to recliner with two assist, groin pain keeping him from going further.  Also still due to void s/p foley being removed at 0400.  Oncoming nurse notified.

## 2019-09-15 NOTE — Progress Notes (Signed)
CRITICAL VALUE ALERT  Critical Value:  Hgb 6.1  Date & Time Notied:  09/15/19 at 0610  Provider Notified: Dr. Scot Dock  Orders Received/Actions taken: Prepare and give the patient 2 Units of RBCs.

## 2019-09-15 NOTE — Evaluation (Signed)
Physical Therapy Evaluation Patient Details Name: Ryan [name] MRN: [id number] DOB: 1956-02-17 Today's Date: 09/15/2019   History of Present Illness  Pt is 64 yo male admitted with critical R lower extremity ischemia.  He is s/p bypass with harvest R greater saphenous vein, extensive R external iliac, common femoral and profunda femoral endarterectomy, R common fem to ant tibial artery bypass, stenting R common and external iliac artery, limited R LE angiogram on 09/14/19.  Found to have hematoma R groin 3/23 in evening.  Hgb was 6.1 and pt received 2 units PRBC. Pt with PMH including dyspnea, PVD, neuromuscular disorder, HTN, HLD.  Clinical Impression  Pt admitted with above diagnosis. Pt requiring min-mod A for transfers and cues for transfer techniques.  Ambulated 37' with RW and min guard with cues for pain control techniques and RW proximity. Pt's ROM and strength limited due to pain, edema, and acuity of surgery.  Pt with good support at home and RW.   Pt currently with functional limitations due to the deficits listed below (see PT Problem List). Pt will benefit from skilled PT to increase their independence and safety with mobility to allow discharge to the venue listed below.       Follow Up Recommendations Home health PT;Supervision for mobility/OOB    Equipment Recommendations  3in1 (PT)    Recommendations for Other Services       Precautions / Restrictions Precautions Precautions: Fall Restrictions RLE Weight Bearing: Weight bearing as tolerated      Mobility  Bed Mobility Overal bed mobility: Needs Assistance Bed Mobility: Supine to Sit;Sit to Supine     Supine to sit: Min assist Sit to supine: Min assist   General bed mobility comments: min A for R LE due to pain and weakness  Transfers Overall transfer level: Needs assistance Equipment used: Rolling walker (2 wheeled) Transfers: Sit to/from Stand Sit to Stand: Min assist;Mod assist;From elevated surface          General transfer comment: cues for safe hand placement; x 2 from elevated surface with min A; x 1 lower surface with modA  Ambulation/Gait Ambulation/Gait assistance: Min guard Gait Distance (Feet): 60 Feet Assistive device: Rolling walker (2 wheeled) Gait Pattern/deviations: Step-to pattern;Decreased stride length;Decreased dorsiflexion - right;Shuffle Gait velocity: decreased   General Gait Details: Cued for RW use and proximity;  cued for step to pattern for pain control; noted decreased DF on R likely due to pain/edema;  Pt with DOE of 3/4 (reports due to pain) - unable to get pulse ox reading  Stairs            Wheelchair Mobility    Modified Rankin (Stroke Patients Only)       Balance Overall balance assessment: Needs assistance Sitting-balance support: No upper extremity supported;Feet supported Sitting balance-Leahy Scale: Good     Standing balance support: Bilateral upper extremity supported;During functional activity Standing balance-Leahy Scale: Fair                               Pertinent Vitals/Pain Pain Assessment: 0-10 Pain Score: 7  Pain Location: R leg Pain Intervention(s): Limited activity within patient's tolerance;Monitored during session    [place] expects to be discharged to:: Private residence Living Arrangements: Children Available Help at Discharge: Family;Available 24 hours/day Type of Home: House Home Access: Stairs to enter Entrance Stairs-Rails: Left Entrance Stairs-Number of Steps: 4 Home Layout: Two level;Able to live on main level  with bedroom/bathroom Home Equipment: Latina Craver - 2 wheels      Prior Function Level of Independence: Independent         Comments: Reports independent IADLs, ADLS, driving, short community distances     Hand Dominance        Extremity/Trunk Assessment   Upper Extremity Assessment Upper Extremity Assessment: Defer to OT evaluation     Lower Extremity Assessment Lower Extremity Assessment: RLE deficits/detail;LLE deficits/detail RLE Deficits / Details: ROM: WFL but did not decreased DF on R compared to L due to pain and edema.;  MMT:  3/5 ankle DF and knee ext not further tested, 1/5 R hip RLE Coordination: decreased gross motor LLE Deficits / Details: ROM WFL; MMT 5/5       Communication   Communication: No difficulties  Cognition Arousal/Alertness: Awake/alert Behavior During Therapy: WFL for tasks assessed/performed Overall Cognitive Status: Within Functional Limits for tasks assessed                                        General Comments General comments (skin integrity, edema, etc.): VSS    Exercises     Assessment/Plan    PT Assessment Patient needs continued PT services  PT Problem List Decreased strength;Decreased mobility;Decreased range of motion;Decreased activity tolerance;Decreased balance;Decreased knowledge of use of DME       PT Treatment Interventions DME instruction;Therapeutic activities;Gait training;Therapeutic exercise;Patient/family education;Stair training;Balance training;Functional mobility training    PT Goals (Current goals can be found in the Care Plan section)  Acute Rehab PT Goals Patient Stated Goal: return home PT Goal Formulation: With patient Time For Goal Achievement: 09/29/19 Potential to Achieve Goals: Good    Frequency Min 3X/week   Barriers to discharge        Co-evaluation PT/OT/SLP Co-Evaluation/Treatment: Yes Reason for Co-Treatment: For patient/therapist safety PT goals addressed during session: Mobility/safety with mobility;Balance;Proper use of DME OT goals addressed during session: ADL's and self-care       AM-PAC PT "6 Clicks" Mobility  Outcome Measure Help needed turning from your back to your side while in a flat bed without using bedrails?: A Little Help needed moving from lying on your back to sitting on the side of a  flat bed without using bedrails?: A Little Help needed moving to and from a bed to a chair (including a wheelchair)?: A Little Help needed standing up from a chair using your arms (e.g., wheelchair or bedside chair)?: A Lot Help needed to walk in hospital room?: A Little Help needed climbing 3-5 steps with a railing? : A Lot 6 Click Score: 16    End of Session Equipment Utilized During Treatment: Gait belt Activity Tolerance: Patient tolerated treatment well Patient left: in bed;with call bell/phone within reach;with bed alarm set Nurse Communication: Mobility status PT Visit Diagnosis: Other abnormalities of gait and mobility (R26.89);Muscle weakness (generalized) (M62.81)    Time: [id number] PT Time Calculation (min) (ACUTE ONLY): 42 min   Charges:   PT Evaluation $PT Eval Low Complexity: 1 Low PT Treatments $Gait Training: 8-22 mins        Maggie Font, PT Acute Rehab Services Pager [phone] [facility] [phone] [facility] [phone]   Karlton Lemon 09/15/2019, 3:45 PM

## 2019-09-15 NOTE — Evaluation (Signed)
Occupational Therapy Evaluation Patient Details Name: Ryan [name] MRN: [id number] DOB: 1956/04/03 Today's Date: 09/15/2019    History of Present Illness Pt is 64 yo male admitted with critical R lower extremity ischemia.  He is s/p bypass with harvest R greater saphenous vein, extensive R external iliac, common femoral and profunda femoral endarterectomy, R common fem to ant tibial artery bypass, stenting R common and external iliac artery, limited R LE angiogram on 09/14/19.  Found to have hematoma R groin 3/23 in evening.  Hgb was 6.1 and pt received 2 units PRBC. Pt with PMH including dyspnea, PVD, neuromuscular disorder, HTN, HLD.   Clinical Impression   PTA pt living independently with daughter, able to complete BADL and light IADL in community. At time of eval, pt is min A for bed mobility and min-mod A for sit <> stands depending on height of surface. Pt was able to complete functional mobility a household distance with min A with RW and toilet transfer with mod A with grab bars and RW. Education given on use of BSC in the home (over toilet and in tub shower), as well as LB AE for improved independence. Recommend HHOT at d/c for continued progression of BADLs in the home environment. Will continue to follow per POC listed below.     Follow Up Recommendations  Home health OT;Supervision/Assistance - 24 hour    Equipment Recommendations  3 in 1 bedside commode;Other (comment)(LB AE)    Recommendations for Other Services       Precautions / Restrictions Precautions Precautions: Fall Restrictions Weight Bearing Restrictions: Yes RLE Weight Bearing: Weight bearing as tolerated      Mobility Bed Mobility Overal bed mobility: Needs Assistance Bed Mobility: Supine to Sit;Sit to Supine     Supine to sit: Min assist Sit to supine: Min assist   General bed mobility comments: min A for R LE due to pain and weakness  Transfers Overall transfer level: Needs  assistance Equipment used: Rolling walker (2 wheeled) Transfers: Sit to/from Stand Sit to Stand: Min assist;Mod assist;From elevated surface         General transfer comment: cues for safe hand placement; x 2 from elevated surface with min A; x 1 lower surface with modA    Balance Overall balance assessment: Needs assistance Sitting-balance support: No upper extremity supported;Feet supported Sitting balance-Leahy Scale: Good     Standing balance support: Bilateral upper extremity supported;During functional activity Standing balance-Leahy Scale: Fair                             ADL either performed or assessed with clinical judgement   ADL Overall ADL's : Needs assistance/impaired Eating/Feeding: Set up;Sitting   Grooming: Set up;Sitting   Upper Body Bathing: Set up;Sitting   Lower Body Bathing: Maximal assistance;Sit to/from stand   Upper Body Dressing : Set up;Sitting   Lower Body Dressing: Maximal assistance;Sit to/from stand   Toilet Transfer: Moderate assistance;Stand-pivot;Regular Toilet;Grab bars;RW Armed forces technical officer Details (indicate cue type and reason): pt requireing mod A to rise off of low toilet despite use of grab bar. Will need BSC or toilet rise to increase independence in transfer [place] and Hygiene: Minimal assistance;Sit to/from stand   Tub/ Banker: Moderate assistance;Stand-pivot;Shower seat;Rolling walker   Functional mobility during ADLs: Minimal assistance;Min guard;Rolling walker       Vision Patient Visual Report: No change from baseline       Perception  Praxis      Pertinent Vitals/Pain Pain Assessment: 0-10 Pain Score: 7  Pain Location: R leg Pain Descriptors / Indicators: Aching Pain Intervention(s): Limited activity within patient's tolerance;Monitored during session     Hand Dominance     Extremity/Trunk Assessment Upper Extremity Assessment Upper Extremity Assessment:  Generalized weakness;LUE deficits/detail LUE Deficits / Details: difficulty with full shoulder ROM, reports painful "catching" past 90 degrees   Lower Extremity Assessment Lower Extremity Assessment: Defer to PT evaluation RLE Deficits / Details: ROM: WFL but did not decreased DF on R compared to L due to pain and edema.;  MMT:  3/5 ankle DF and knee ext not further tested, 1/5 R hip RLE Coordination: decreased gross motor LLE Deficits / Details: ROM WFL; MMT 5/5       Communication Communication Communication: No difficulties   Cognition Arousal/Alertness: Awake/alert Behavior During Therapy: WFL for tasks assessed/performed Overall Cognitive Status: Within Functional Limits for tasks assessed                                     General Comments  VSS    Exercises     Shoulder Instructions      Home Living Family/patient expects to be discharged to:: Private residence Living Arrangements: Children Available Help at Discharge: Family;Available 24 hours/day Type of Home: House Home Access: Stairs to enter [employer] of Steps: 4 Entrance Stairs-Rails: Left Home Layout: Two level;Able to live on main level with bedroom/bathroom Alternate Level Stairs-Number of Steps: 12 Alternate Level Stairs-Rails: Right Bathroom Shower/Tub: Teacher, early years/pre: Standard     Home Equipment: Latina Craver - 2 wheels          Prior Functioning/Environment Level of Independence: Independent        Comments: Reports independent IADLs, ADLS, driving, short community distances        OT Problem List: Decreased strength;Decreased knowledge of use of DME or AE;Decreased range of motion;Decreased activity tolerance;Impaired UE functional use;Impaired balance (sitting and/or standing);Pain      OT Treatment/Interventions: Self-care/ADL training;Therapeutic exercise;Patient/family education;Balance training;Energy conservation;Therapeutic  activities;DME and/or AE instruction    OT Goals(Current goals can be found in the care plan section) Acute Rehab OT Goals Patient Stated Goal: return home OT Goal Formulation: With patient Time For Goal Achievement: 09/29/19 Potential to Achieve Goals: Good  OT Frequency: Min 2X/week   Barriers to D/C:            Co-evaluation PT/OT/SLP Co-Evaluation/Treatment: Yes Reason for Co-Treatment: For patient/therapist safety PT goals addressed during session: Mobility/safety with mobility;Balance;Proper use of DME OT goals addressed during session: ADL's and self-care      AM-PAC OT "6 Clicks" Daily Activity     Outcome Measure Help from another person eating meals?: None Help from another person taking care of personal grooming?: None Help from another person toileting, which includes using toliet, bedpan, or urinal?: A Lot Help from another person bathing (including washing, rinsing, drying)?: A Lot Help from another person to put on and taking off regular upper body clothing?: None Help from another person to put on and taking off regular lower body clothing?: A Lot 6 Click Score: 18   End of Session Equipment Utilized During Treatment: Gait belt;Rolling walker Nurse Communication: Mobility status;Precautions  Activity Tolerance: Patient tolerated treatment well Patient left: in bed;with call bell/phone within reach  OT Visit Diagnosis: Unsteadiness on feet (R26.81);Other abnormalities of  gait and mobility (R26.89);Muscle weakness (generalized) (M62.81);Pain Pain - Right/Left: Right Pain - part of body: Leg                Time: [id number] OT Time Calculation (min): 41 min Charges:  OT General Charges $OT Visit: 1 Visit OT Evaluation $OT Eval Moderate Complexity: [place], MSOT, OTR/L [place] [facility] Office Number: [phone] Pager: [phone]  Zenovia Jarred 09/15/2019, 6:21 PM

## 2019-09-15 NOTE — Progress Notes (Signed)
Daughter Renda Rolls came and requested update on her father. Daughter feels that he is not at his baseline. Daughter and patient state he is short of breath. RR 16-24 and oxygen sats 95-100% on room air. This nurse does not see any change in breathing today as same yesterday. Will continue to monitor. Payton Emerald, RN

## 2019-09-15 NOTE — Progress Notes (Signed)
Pt notably more comfortable than this morning at change of shift.  Suppository administered and pt able to get OOB and onto [facility] with two assist.

## 2019-09-16 ENCOUNTER — Inpatient Hospital Stay (HOSPITAL_COMMUNITY): Payer: BLUE CROSS/BLUE SHIELD

## 2019-09-16 DIAGNOSIS — I70229 Atherosclerosis of native arteries of extremities with rest pain, unspecified extremity: Secondary | ICD-10-CM

## 2019-09-16 LAB — BASIC METABOLIC PANEL
Anion gap: 9 (ref 5–15)
BUN: 33 mg/dL — ABNORMAL HIGH (ref 8–23)
CO2: 17 mmol/L — ABNORMAL LOW (ref 22–32)
Calcium: 7.2 mg/dL — ABNORMAL LOW (ref 8.9–10.3)
Chloride: 112 mmol/L — ABNORMAL HIGH (ref 98–111)
Creatinine, Ser: 4 mg/dL — ABNORMAL HIGH (ref 0.61–1.24)
GFR calc Af Amer: 17 mL/min — ABNORMAL LOW (ref >60)
GFR calc non Af Amer: 15 mL/min — ABNORMAL LOW (ref >60)
Glucose, Bld: 109 mg/dL — ABNORMAL HIGH (ref 70–99)
Potassium: 5 mmol/L (ref 3.5–5.1)
Sodium: 138 mmol/L (ref 135–145)

## 2019-09-16 LAB — CBC
HCT: 26.3 % — ABNORMAL LOW (ref 39.0–52.0)
Hemoglobin: 8.2 g/dL — ABNORMAL LOW (ref 13.0–17.0)
MCH: 26.7 pg (ref 26.0–34.0)
MCHC: 31.2 g/dL (ref 30.0–36.0)
MCV: 85.7 fL (ref 80.0–100.0)
Platelets: 178 10*3/uL (ref 150–400)
RBC: 3.07 MIL/uL — ABNORMAL LOW (ref 4.22–5.81)
RDW: 20.2 % — ABNORMAL HIGH (ref 11.5–15.5)
WBC: 5.9 10*3/uL (ref 4.0–10.5)
nRBC: 0.3 % — ABNORMAL HIGH (ref 0.0–0.2)

## 2019-09-16 LAB — BPAM RBC
Blood Product Expiration Date: 202104112359
Blood Product Expiration Date: 202104112359
ISSUE DATE / TIME: 202103240747
ISSUE DATE / TIME: 202103241031
Unit Type and Rh: 6200
Unit Type and Rh: 6200

## 2019-09-16 LAB — TYPE AND SCREEN
ABO/RH(D): A POS
Antibody Screen: NEGATIVE
Unit division: 0
Unit division: 0

## 2019-09-16 MED ORDER — SODIUM BICARBONATE 650 MG PO TABS
650.0000 mg | ORAL_TABLET | Freq: Two times a day (BID) | ORAL | Status: DC
  Administered 2019-09-16 – 2019-09-17 (×4): 650 mg via ORAL
  Filled 2019-09-16 (×4): qty 1

## 2019-09-16 NOTE — Progress Notes (Addendum)
[facility]  Subjective  - worried about swollen penis and right swollen leg.   Objective 119/82 99 99.3 F (37.4 C) (Oral) 15 97%  Intake/Output Summary (Last 24 hours) at 09/16/2019 [id number] Last data filed at 09/15/2019 2222 Gross per 24 hour  Intake 880 ml  Output 4 ml  Net 876 ml    Right LE with Brisk right peroneal >DP doppler signal Sever right LE edema, penis edema with fore skin inversion Right groin hematoma Lungs non labored breathing Heart RRR  Assessment/Planning: POD # 2  1.Harvest of right greater saphenous vein 2.Extensive Right external iliac, common femoral and profunda femoral endarterectomy with vein patch angioplasty 3.Right common femoral to anterior tibial artery bypass with composite 58mm ringed ptfe and reversed greater saphenous vein distally 4.Right common and external iliac artery stenting with 6 x 195mm Elluvia 5.Limited right lower extremity angiogram   Elevated CR Nephrology consult called. Will discuss intervention with Dr. [name] Encourage elevation of right LE [place] gel to patient to try to resolve penis inversion.  Roxy Horseman 09/16/2019 7:52 AM --  Laboratory Lab Results: Recent Labs    09/15/19 0526 09/15/19 0526 09/15/19 1657 09/16/19 0341  WBC 4.7  --   --  5.9  HGB 6.1*   < > 9.4* 8.2*  HCT 19.6*   < > 29.9* 26.3*  PLT 200  --   --  178   < > = values in this interval not displayed.   BMET Recent Labs    09/15/19 0302 09/16/19 0341  NA 141 138  K 4.9 5.0  CL 113* 112*  CO2 17* 17*  GLUCOSE 128* 109*  BUN 27* 33*  CREATININE 2.74* 4.00*  CALCIUM 7.1* 7.2*    COAG Lab Results  Component Value Date   INR 1.2 09/13/2019   INR 1.1 01/20/2019   INR 1.2 10/17/2018   No results found for: PTT  I have independently interviewed and examined patient and agree with PA assessment and plan above. Having volume overload issues with elevated Cr. Will get nephrology to  evaluate. Would not evacuate hematoma given generalized edema.   Naheim Burgen C. [name], MD [facility] [place] Office: [phone] Pager: [phone]

## 2019-09-16 NOTE — TOC Initial Note (Signed)
Transition of Care (TOC) - Initial/Assessment Note  Marvetta Gibbons RN, BSN Transitions of Care Unit 4E- RN Case Manager [phone]   Patient Details  Name: Ryan [name] MRN: [id number] Date of Birth: 1955-09-10  Transition of Care [facility]) CM/SW Contact:    Dawayne Patricia, RN Phone Number: 09/16/2019, 2:31 PM  Clinical Narrative:                 Pt s/p peripheral bypass with angioplasty. From home with family. Orders placed for [facility] and DME needs. CM spoke with pt at bedside- per pt he has walker and cane at home- not sure he needs 3n1 at home- pt informed that if he wants 3n1 to let staff know and DME can be delivered to room prior to discharge- at this time pt states he does not need it. Discussed HH- list provided for [facility] choice Per CMS guidelines from medicare.gov website with star ratings (copy placed in shadow chart), pt states he does not have a preference for agency and give permission for CM to find agency that will work with his insurance for needed services. Address, phone # and PCP all confirmed with pt in epic. Pt reports he will have transportation home, and denies any difficulties with getting medications.  Call made to [facility] with [facility] for [facility] referral- referral has been accepted for HHPT/OT.      Expected Discharge Plan: [place] Barriers to Discharge: Continued Medical Work up   Patient Goals and CMS Choice Patient states their goals for this hospitalization and ongoing recovery are:: to get back on my feet and get back to work [employer].gov Compare Post Acute Care list provided to:: Patient Choice offered to / list presented to : Patient  Expected Discharge Plan and Services Expected Discharge Plan: [place]   Discharge Planning Services: CM Consult Post Acute Care Choice: Durable Medical Equipment, Home Health Living arrangements for the past 2 months: Single Family Home                 DME Arranged: 3-N-1         HH Arranged: OT, PT [place] Agency: [place] ([facility]) Date [place]: 09/16/19 Time [place]: [id number] Representative spoke with at [place]: Butch Penny  Prior Living Arrangements/Services Living arrangements for the past 2 months: [place] with:: Self, Adult Children Patient language and need for interpreter reviewed:: Yes Do you feel safe going back to the place where you live?: Yes      Need for Family Participation in Patient Care: Yes (Comment) Care giver support system in place?: Yes (comment) Current home services: DME(walker, cane) Criminal Activity/Legal Involvement Pertinent to Current Situation/Hospitalization: No - Comment as needed  Activities of Daily Living      Permission Sought/Granted Permission sought to share information with : Chartered certified accountant granted to share information with : Yes, Verbal Permission Granted     Permission granted to share info w AGENCY: [place] agencies        Emotional Assessment Appearance:: Appears stated age Attitude/Demeanor/Rapport: Engaged Affect (typically observed): Appropriate Orientation: : Oriented to Self, Oriented to Place, Oriented to  Time, Oriented to Situation   Psych Involvement: No (comment)  Admission diagnosis:  PAD (peripheral artery disease) ([place]) [I73.9] Patient Active Problem List   Diagnosis Date Noted  . Ischemia of right lower extremity 01/28/2019  . Iron deficiency anemia 11/23/2018  . Stage 3 chronic kidney disease  10/21/2018  . Atherosclerosis of native arteries of extremity with rest pain ([place]) 10/13/2018  . PAD (peripheral artery disease) ([place]) 07/21/2018  . Ischemic leg 06/25/2018  . Claudication ([place]) 03/17/2018  . B12 deficiency 03/12/2018  . Vitamin B1 deficiency 03/12/2018  . Varicose veins of leg with pain, right 03/12/2018  . Enlarged thoracic aorta ([place]) 02/24/2018  . Alcoholism /alcohol abuse ([place]) 02/24/2018  . Paresthesia  02/24/2018  . Ectatic aorta ([place]) 02/20/2018  . Emphysema lung ([place]) 02/20/2018  . Atherosclerosis of aorta ([place]) 02/20/2018  . Encounter for tobacco use cessation counseling   . Benign neoplasm of cecum   . Benign neoplasm of transverse colon   . Benign neoplasm of sigmoid colon   . Hypertension, benign 08/31/2015  . Tobacco use 08/31/2015   PCP:  Steele Sizer, MD Pharmacy:   [facility] (N), [address] - [address] [place]) [place] [address] Phone: [phone] Fax: [phone]     Social Determinants of Health (SDOH) Interventions    Readmission Risk Interventions Readmission Risk Prevention Plan 09/16/2019  Transportation Screening Complete  HRI or [place] Complete  Social Work Consult for [place] Planning/Counseling Complete  Palliative Care Screening Not Applicable  Medication Review Press photographer) Complete  Some recent data might be hidden

## 2019-09-16 NOTE — Progress Notes (Signed)
ABI has been completed.   Preliminary results in CV Proc.   Abram Sander 09/16/2019 10:51 AM

## 2019-09-16 NOTE — Plan of Care (Signed)
  Problem: Education: Goal: Knowledge of General Education information will improve Description: Including pain rating scale, medication(s)/side effects and non-pharmacologic comfort measures Outcome: Progressing   Problem: Health Behavior/Discharge Planning: Goal: Ability to manage health-related needs will improve Outcome: Progressing   Problem: Clinical Measurements: Goal: Ability to maintain clinical measurements within normal limits will improve Outcome: Progressing Goal: Will remain free from infection Outcome: Progressing Goal: Diagnostic test results will improve Outcome: Progressing Goal: Respiratory complications will improve Outcome: Progressing Goal: Cardiovascular complication will be avoided Outcome: Progressing   Problem: Activity: Goal: Risk for activity intolerance will decrease Outcome: Progressing   Problem: Nutrition: Goal: Adequate nutrition will be maintained Outcome: Progressing   Problem: Coping: Goal: Level of anxiety will decrease Outcome: Progressing   Problem: Elimination: Goal: Will not experience complications related to bowel motility Outcome: Progressing Goal: Will not experience complications related to urinary retention Outcome: Progressing   Problem: Pain Managment: Goal: General experience of comfort will improve Outcome: Progressing   Problem: Safety: Goal: Ability to remain free from injury will improve Outcome: Progressing   Problem: Skin Integrity: Goal: Risk for impaired skin integrity will decrease Outcome: Progressing   Problem: Education: Goal: Knowledge of prescribed regimen will improve Outcome: Progressing   Problem: Activity: Goal: Ability to tolerate increased activity will improve Outcome: Progressing   Problem: Bowel/Gastric: Goal: Gastrointestinal status for postoperative course will improve Outcome: Progressing   Problem: Clinical Measurements: Goal: Postoperative complications will be avoided or  minimized Outcome: Progressing Goal: Signs and symptoms of graft occlusion will improve Outcome: Progressing   Problem: Skin Integrity: Goal: Demonstration of wound healing without infection will improve Outcome: Progressing   Problem: Education: Goal: Knowledge of prescribed regimen will improve Outcome: Progressing   Problem: Activity: Goal: Ability to tolerate increased activity will improve Outcome: Progressing   Problem: Bowel/Gastric: Goal: Gastrointestinal status for postoperative course will improve Outcome: Progressing   Problem: Clinical Measurements: Goal: Postoperative complications will be avoided or minimized Outcome: Progressing Goal: Signs and symptoms of graft occlusion will improve Outcome: Progressing   Problem: Skin Integrity: Goal: Demonstration of wound healing without infection will improve Outcome: Progressing

## 2019-09-16 NOTE — Consult Note (Signed)
Ryan [name]  Reason for Consultation: AKI on CKD Requesting Provider: Dr. [name]  HPI: Ryan [name] is an 64 y.o. male with HTN, HL, PAD who is seen for evaluation and management of AKI on CKD.   Pt admitted 3/22 for aortogram and LE angio due to ischemic rest pain LE --> 125m contrast with RLE disease requiring intervention.  3/23 revascularization - endarterectomy, vein patch angioplasty, PTFE bypass, stenting; contrast 659m  Had flank pain post op and had CT A/P with 8097montrast.  R hematoma noted, not active bleeding.  L kidney is normal, R atrophic.  Hb down from 11.9 admission to 6.1 yesterday AM, transfused to 9.4 > 8.2 this AM.  Baseline Cr appears to be 1.5-1.9, eGFR 25-50 as of mid 2020.  09/13/19 Cr 1.8 on admission and has trended up to 4 today.   I/Os +3.7 for admission but UOP not documented - having significant scrotal and penile edema.  Says uncircumsized and having some difficulty but can pass urine and does not strain to void. Some modest hypotension 90/60s PM 3/23.  UA 3/23 small blood, 1+ protein.  No NSAIDs.   PMH: Past Medical History:  Diagnosis Date  . Hyperlipidemia   . Hypertension   . Neuromuscular disorder (HCC)    numbness feet  . [place] disease ([place] . Shortness of breath dyspnea    PSH: Past Surgical History:  Procedure Laterality Date  . ABDOMINAL AORTOGRAM W/LOWER EXTREMITY N/A 09/13/2019   Procedure: ABDOMINAL AORTOGRAM W/LOWER EXTREMITY;  Surgeon: CaiWaynetta SandyD;  [facility];  Service: Cardiovascular;  Laterality: N/A;  . COLONOSCOPY WITH PROPOFOL N/A 02/15/2016   Procedure: COLONOSCOPY WITH PROPOFOL;  Surgeon: DarLucilla LameD;  [facility];  Laterality: N/A;  . FEMORAL-POPLITEAL BYPASS GRAFT Right 09/14/2019   Procedure: BYPASS GRAFT FEMORAL-POPLITEAL ARTERY;  Surgeon: CaiWaynetta SandyD;  Location: [facility] Service: Vascular;  Laterality: Right;  . HERNIA REPAIR  1999   left inguinal  . INSERTION OF ILIAC STENT Right 09/14/2019   Procedure: Insertion Of Common and External Iliac Stent;  Surgeon: CaiWaynetta SandyD;  [facility];  Laterality: Right;  . LOWER EXTREMITY ANGIOGRAPHY Right 05/07/2018   Procedure: LOWER EXTREMITY ANGIOGRAPHY;  Surgeon: DewAlgernon HuxleyD;  [facility];  Service: Cardiovascular;  Laterality: Right;  . LOWER EXTREMITY ANGIOGRAPHY Right 06/25/2018   Procedure: LOWER EXTREMITY ANGIOGRAPHY;  Surgeon: DewAlgernon HuxleyD;  [facility];  Service: Cardiovascular;  Laterality: Right;  . LOWER EXTREMITY ANGIOGRAPHY Right 07/01/2018   Procedure: LOWER EXTREMITY ANGIOGRAPHY;  Surgeon: DewAlgernon HuxleyD;  [facility];  Service: Cardiovascular;  Laterality: Right;  . LOWER EXTREMITY ANGIOGRAPHY Right 08/12/2018   Procedure: LOWER EXTREMITY ANGIOGRAPHY;  Surgeon: DewAlgernon HuxleyD;  [facility];  Service: Cardiovascular;  Laterality: Right;  . LOWER EXTREMITY ANGIOGRAPHY Right 09/03/2018   Procedure: LOWER EXTREMITY ANGIOGRAPHY;  Surgeon: DewAlgernon HuxleyD;  [facility];  Service: Cardiovascular;  Laterality: Right;  . LOWER EXTREMITY ANGIOGRAPHY Right 09/04/2018   Procedure: Lower Extremity Angiography;  Surgeon: DewAlgernon HuxleyD;  [facility];  Service: Cardiovascular;  Laterality: Right;  . LOWER EXTREMITY ANGIOGRAPHY Right 10/01/2018   Procedure: LOWER EXTREMITY ANGIOGRAPHY;  Surgeon: DewAlgernon HuxleyD;  [facility];  Service:  Cardiovascular;  Laterality: Right;  . LOWER EXTREMITY ANGIOGRAPHY Right 10/01/2018   Procedure: Lower Extremity Angiography;  Surgeon: Algernon Huxley, MD;  Location: [facility] [facility];  Service: Cardiovascular;  Laterality: Right;  . LOWER EXTREMITY ANGIOGRAPHY Right 10/15/2018   Procedure: LOWER EXTREMITY  ANGIOGRAPHY;  Surgeon: Algernon Huxley, MD;  Location: [place] [facility];  Service: Cardiovascular;  Laterality: Right;  . LOWER EXTREMITY ANGIOGRAPHY Left 10/16/2018   Procedure: Lower Extremity Angiography;  Surgeon: Algernon Huxley, MD;  Location: [place] [facility];  Service: Cardiovascular;  Laterality: Left;  . LOWER EXTREMITY ANGIOGRAPHY Left 01/20/2019   Procedure: LOWER EXTREMITY ANGIOGRAPHY;  Surgeon: Algernon Huxley, MD;  Location[facility] [place] [facility];  Service: Cardiovascular;  Laterality: Left;  . LOWER EXTREMITY ANGIOGRAPHY Right 01/21/2019   Procedure: Lower Extremity Angiography;  Surgeon: Algernon Huxley, MD;  Location: [place] [facility];  Service: Cardiovascular;  Laterality: Right;  . LOWER EXTREMITY ANGIOGRAPHY Right 01/28/2019   Procedure: LOWER EXTREMITY ANGIOGRAPHY;  Surgeon: Algernon Huxley, MD;  Location: [place] [facility];  Service: Cardiovascular;  Laterality: Right;  . LOWER EXTREMITY ANGIOGRAPHY Right 01/29/2019   Procedure: Lower Extremity Angiography;  Surgeon: Algernon Huxley, MD;  Location: [facility];  Service: Cardiovascular;  Laterality: Right;  . LOWER EXTREMITY INTERVENTION Right 07/02/2018   Procedure: LOWER EXTREMITY INTERVENTION;  Surgeon: Algernon Huxley, MD;  Location: [facility] [facility];  Service: Cardiovascular;  Laterality: Right;  . POLYPECTOMY N/A 02/15/2016   Procedure: POLYPECTOMY;  Surgeon: Lucilla Lame, MD;  Location: [place];  Service: Endoscopy;  Laterality: N/A;    Past Medical History:  Diagnosis Date  . Hyperlipidemia   . Hypertension   . Neuromuscular disorder (HCC)    numbness feet  . Peripheral vascular disease ([place])   . Shortness of breath dyspnea     Medications:  I have reviewed the patient's current medications.  Medications Prior to Admission  Medication Sig Dispense Refill  . acetaminophen (TYLENOL) 500 MG tablet Take 1 tablet (500 mg total) by mouth 2 (two) times daily. (Patient taking differently: Take 500 mg by  mouth every 6 (six) hours as needed for mild pain or moderate pain. ) 100 tablet 0  . apixaban (ELIQUIS) 5 MG TABS tablet Take 1 tablet (5 mg total) by mouth 2 (two) times daily. 180 tablet 3  . aspirin EC 81 MG tablet Take 1 tablet (81 mg total) by mouth daily. 90 tablet 3  . atorvastatin (LIPITOR) 20 MG tablet Take 1 tablet by mouth once daily (Patient taking differently: Take 20 mg by mouth daily. ) 90 tablet 0  . Cyanocobalamin (VITAMIN B-12) 1000 MCG SUBL Place 1 tablet (1,000 mcg total) under the tongue daily. (Patient taking differently: Place 500 mcg under the tongue daily. ) 90 tablet 0  . Fluticasone-Umeclidin-Vilant (TRELEGY ELLIPTA) 100-62.5-25 MCG/INH AEPB Inhale 1 puff into the lungs daily. (Patient taking differently: Inhale 1 puff into the lungs daily as needed (shortness of breath). ) 60 each 2  . lisinopril (ZESTRIL) 20 MG tablet Take 1 tablet by mouth once daily (Patient taking differently: Take 20 mg by mouth daily. ) 90 tablet 0  . ondansetron (ZOFRAN ODT) 4 MG disintegrating tablet Take 1 tablet (4 mg total) by mouth every 8 (eight) hours as needed. (Patient taking differently: Take 4 mg by mouth every 8 (eight) hours as needed for nausea. ) 20 tablet 0  . amLODipine (NORVASC) 2.5 MG tablet Take 1 tablet (  2.5 mg total) by mouth daily. (Patient not taking: Reported on 09/09/2019) 90 tablet 0  . thiamine (VITAMIN B-1) 50 MG tablet Take 1 tablet (50 mg total) by mouth daily. 30 tablet 2    ALLERGIES:  No Known Allergies  FAM HX: Family History  Problem Relation Age of Onset  . COPD Mother   . Hypertension Father   . Heart attack Brother     Social History:   reports that he quit smoking about 8 months ago. His smoking use included cigarettes. He started smoking about 41 years ago. He has a 20.00 pack-year smoking history. He has never used smokeless tobacco. He reports current alcohol use of about 12.0 standard drinks of alcohol per week. He reports that he does not use  drugs.  ROS: 12 system ROS neg except per HPI  Blood pressure 119/82, pulse 99, temperature 99.3 F (37.4 C), temperature source Oral, resp. rate 15, height 5' 8"  (1.727 m), weight 65.3 kg, SpO2 97 %. PHYSICAL EXAM: Gen: well appearing sitting besdie bed  Eyes: anciteric ENT: MMM Neck: supple, no JVD CV:  RRR, no rub Abd:  Soft, nontender Back: clear to bases Extr: RLE with 1+ tibial edema Neuro: nonfocal Skin: incision in RLE c/d/i, no livedo   Results for orders placed or performed during the hospital encounter of 09/13/19 (from the past 48 hour(s))  Creatinine, serum     Status: Abnormal   Collection Time: 09/14/19  4:03 PM  Result Value Ref Range   Creatinine, Ser 2.35 (H) 0.61 - 1.24 mg/dL   GFR calc non Af Amer 29 (L) >60 mL/min   GFR calc Af Amer 33 (L) >60 mL/min    Comment: Performed at [facility], [address] [place] [address] [place], [place] [address]  Basic metabolic panel     Status: Abnormal   Collection Time: 09/15/19  3:02 AM  Result Value Ref Range   Sodium 141 135 - 145 mmol/L   Potassium 4.9 3.5 - 5.1 mmol/L   Chloride 113 (H) 98 - 111 mmol/L   CO2 17 (L) 22 - 32 mmol/L   Glucose, Bld 128 (H) 70 - 99 mg/dL    Comment: Glucose reference range applies only to samples taken after fasting for at least 8 hours.   BUN 27 (H) 8 - 23 mg/dL   Creatinine, Ser 2.74 (H) 0.61 - 1.24 mg/dL   Calcium 7.1 (L) 8.9 - 10.3 mg/dL   GFR calc non Af Amer 24 (L) >60 mL/min   GFR calc Af Amer 28 (L) >60 mL/min   Anion gap 11 5 - 15    Comment: Performed at [facility] [address], [place], [place] [address]  CBC     Status: Abnormal   Collection Time: 09/15/19  5:26 AM  Result Value Ref Range   WBC 4.7 4.0 - 10.5 K/uL   RBC 2.11 (L) 4.22 - 5.81 MIL/uL   Hemoglobin 6.1 (LL) 13.0 - 17.0 g/dL    Comment: REPEATED TO VERIFY THIS CRITICAL RESULT HAS VERIFIED AND BEEN CALLED TO C. DAVIDSON, RN BY JULIE MACEDA DEL ANGEL ON 03 24 2021 AT 0350, AND HAS BEEN READ BACK.      HCT 19.6 (L) 39.0 - 52.0 %   MCV 92.9 80.0 - 100.0 fL   MCH 28.9 26.0 - 34.0 pg   MCHC 31.1 30.0 - 36.0 g/dL   RDW 15.2 11.5 - 15.5 %   Platelets 200 150 - 400 K/uL  Comment: REPEATED TO VERIFY   nRBC 0.0 0.0 - 0.2 %    Comment: Performed at [facility], [place] [address] [place], [place] [address]  Prepare RBC (crossmatch)     Status: None   Collection Time: 09/15/19  6:19 AM  Result Value Ref Range   Order Confirmation      ORDER PROCESSED BY BLOOD BANK Performed at [facility], [place] [address], [place], [place] [address]   Hemoglobin and hematocrit, blood     Status: Abnormal   Collection Time: 09/15/19  4:57 PM  Result Value Ref Range   Hemoglobin 9.4 (L) 13.0 - 17.0 g/dL    Comment: REPEATED TO VERIFY POST TRANSFUSION SPECIMEN    HCT 29.9 (L) 39.0 - 52.0 %    Comment: Performed at [facility] [address], [place], [place] [address]  Basic metabolic panel     Status: Abnormal   Collection Time: 09/16/19  3:41 AM  Result Value Ref Range   Sodium 138 135 - 145 mmol/L   Potassium 5.0 3.5 - 5.1 mmol/L   Chloride 112 (H) 98 - 111 mmol/L   CO2 17 (L) 22 - 32 mmol/L   Glucose, Bld 109 (H) 70 - 99 mg/dL    Comment: Glucose reference range applies only to samples taken after fasting for at least 8 hours.   BUN 33 (H) 8 - 23 mg/dL   Creatinine, Ser 4.00 (H) 0.61 - 1.24 mg/dL    Comment: DELTA CHECK NOTED   Calcium 7.2 (L) 8.9 - 10.3 mg/dL   GFR calc non Af Amer 15 (L) >60 mL/min   GFR calc Af Amer 17 (L) >60 mL/min   Anion gap 9 5 - 15    Comment: Performed at [facility] [address], [place], [place] [address]  CBC     Status: Abnormal   Collection Time: 09/16/19  3:41 AM  Result Value Ref Range   WBC 5.9 4.0 - 10.5 K/uL   RBC 3.07 (L) 4.22 - 5.81 MIL/uL   Hemoglobin 8.2 (L) 13.0 - 17.0 g/dL    Comment: REPEATED TO VERIFY   HCT 26.3 (L) 39.0 - 52.0 %   MCV 85.7 80.0 - 100.0 fL    Comment: REPEATED TO VERIFY   MCH 26.7 26.0 - 34.0 pg    MCHC 31.2 30.0 - 36.0 g/dL   RDW 20.2 (H) 11.5 - 15.5 %   Platelets 178 150 - 400 K/uL    Comment: REPEATED TO VERIFY   nRBC 0.3 (H) 0.0 - 0.2 %    Comment: Performed at [facility] [address], [place], [place] [address]    CT ABDOMEN PELVIS W CONTRAST  Result Date: 09/14/2019 CLINICAL DATA:  Right flank pain. EXAM: CT ABDOMEN AND PELVIS WITH CONTRAST TECHNIQUE: Multidetector CT imaging of the abdomen and pelvis was performed using the standard protocol following bolus administration of intravenous contrast. CONTRAST:  32m OMNIPAQUE IOHEXOL 300 MG/ML  SOLN COMPARISON:  February 27, 2018 FINDINGS: Lower chest: Mild to moderate severity areas of scarring and/or atelectasis are seen within the bilateral lung bases. A stable area of pleural based calcification is seen within the left lung base. Hepatobiliary: No focal liver abnormality is seen. A large amount of contrast attenuation is seen within gallbladder lumen. Pancreas: Unremarkable. No pancreatic ductal dilatation or surrounding inflammatory changes. Spleen: Normal in size without focal abnormality. Adrenals/Urinary Tract: Adrenal glands are unremarkable. The left kidney is  normal in size while the right kidney is atrophic in appearance. There is no evidence of renal calculi, focal lesions or hydronephrosis. A Foley catheter is seen within the urinary bladder. Moderate severity diffuse urinary bladder wall thickening is noted. Stomach/Bowel: Stomach is within normal limits. Appendix appears normal. No evidence of bowel wall thickening, distention, or inflammatory changes. Vascular/Lymphatic: There is very mild calcification of the abdominal aorta. Right common iliac artery and right external iliac artery stents are seen. This represents a new finding when compared to the prior exam. Vascular enhancement is not clearly seen throughout the length of the stents. A patent right femoral bypass graft is seen extending along the subcutaneous fat  of the anterior aspect of the right lower extremity. This also represents a new finding. No enlarged abdominal or pelvic lymph nodes. Reproductive: Prostate is unremarkable. Other: A small amount of pelvic free fluid is seen. Musculoskeletal: A moderate amount of deep subcutaneous/para muscular soft tissue air is seen along the anterior aspect of the lower pelvic wall on the right. This extends along the right inguinal region, as well as along the anterior and medial aspects of the proximal right lower extremity. A moderate to marked amount of associated subcutaneous inflammatory fat stranding is seen. The previously noted patent right femoral bypass graft extends through this portion of soft tissue. IMPRESSION: 1. Moderate amount of deep subcutaneous/para-muscular soft tissue air, and associated inflammatory fat stranding, along the anterior aspect of the lower right pelvic wall, right inguinal region and anterior/medial aspects of the proximal right lower extremity. The presence of an associated diffuse hematoma is suspected. This is consistent with the patient's recent femoral bypass graft surgery. There is no evidence to suggest the presence of active bleeding. 2. Patent right femoral bypass graft which represents a new finding when compared to the prior study and extends through the previously described area of abnormal right pelvic and right lower extremity soft tissues. 3. Right common iliac artery and right external iliac artery stents, as described above. Correlation with conventional angiography is recommended to further evaluate stent patency. Aortic Atherosclerosis (ICD10-I70.0). Electronically Signed   By: Virgina Norfolk M.D.   On: 09/14/2019 22:49   DG Ang/Ext/Uni/Or Right  Result Date: 09/14/2019 CLINICAL DATA:  Fem-pop bypass graft EXAM: RIGHT ANG/EXT/UNI/ OR CONTRAST:  See procedure note FLUOROSCOPY TIME:  See procedure note COMPARISON:  None. FINDINGS: Submitted limited series of  fluoroscopic subtracted images from intraoperative arteriography demonstrating stent in right common and external iliac arteries. Stenosis in the proximal left common iliac artery. Subsequent image demonstrates graft to trifurcation vessel, incompletely evaluated. IMPRESSION: Intraoperative lower extremity arteriography as above. Electronically Signed   By: Lucrezia Europe M.D.   On: 09/14/2019 15:52    Assessment/Plan **AKI on CKD 3:  Atrophic R kidney, suspect underlying arterionephrosclerosis now with most likely contrast nephropathy.  Unmeasured voids recorded and pt says urinating - needs strict I/Os.  No current indications for dialysis but may develop in coming days -- will follow closely.  Holding home ACEi, avoid nephrotoxins.  Check PVRs q8hr and cath if retaining.   **PAD s/p intervention:  Improved exam per VVS notes; per primary.   **ABLA:  Improved s/p transfusion; no role for ESA  **HTN: hold amlodipine 2.5 with modest hypotension; continue to hold home ACEi.   **Metabolic acidosis:  Mild, secondary to AKI. PO bicarb.   Will follow -- please call with clinical status changes.   Justin Mend 09/16/2019, 10:05 AM

## 2019-09-17 ENCOUNTER — Encounter (HOSPITAL_COMMUNITY): Payer: Self-pay

## 2019-09-17 ENCOUNTER — Ambulatory Visit: Payer: Self-pay | Admitting: Vascular Surgery

## 2019-09-17 LAB — CBC
HCT: 23.1 % — ABNORMAL LOW (ref 39.0–52.0)
Hemoglobin: 7.4 g/dL — ABNORMAL LOW (ref 13.0–17.0)
MCH: 27.7 pg (ref 26.0–34.0)
MCHC: 32 g/dL (ref 30.0–36.0)
MCV: 86.5 fL (ref 80.0–100.0)
Platelets: 168 10*3/uL (ref 150–400)
RBC: 2.67 MIL/uL — ABNORMAL LOW (ref 4.22–5.81)
RDW: 19.2 % — ABNORMAL HIGH (ref 11.5–15.5)
WBC: 5.5 10*3/uL (ref 4.0–10.5)
nRBC: 0 % (ref 0.0–0.2)

## 2019-09-17 LAB — RENAL FUNCTION PANEL
Albumin: 2.7 g/dL — ABNORMAL LOW (ref 3.5–5.0)
Anion gap: 7 (ref 5–15)
BUN: 22 mg/dL (ref 8–23)
CO2: 18 mmol/L — ABNORMAL LOW (ref 22–32)
Calcium: 7.6 mg/dL — ABNORMAL LOW (ref 8.9–10.3)
Chloride: 113 mmol/L — ABNORMAL HIGH (ref 98–111)
Creatinine, Ser: 2.47 mg/dL — ABNORMAL HIGH (ref 0.61–1.24)
GFR calc Af Amer: 31 mL/min — ABNORMAL LOW (ref >60)
GFR calc non Af Amer: 27 mL/min — ABNORMAL LOW (ref >60)
Glucose, Bld: 192 mg/dL — ABNORMAL HIGH (ref 70–99)
Phosphorus: 2.2 mg/dL — ABNORMAL LOW (ref 2.5–4.6)
Potassium: 4.1 mmol/L (ref 3.5–5.1)
Sodium: 138 mmol/L (ref 135–145)

## 2019-09-17 NOTE — Progress Notes (Addendum)
Progress Note    09/17/2019 7:12 AM 3 Days Post-Op  Subjective: Pain is well controlled.  Tolerating diet.  Voiding spontaneously  Nephrology consult initiated yesterday for elevated creatinine, acute kidney injury on chronic kidney disease stage III  Vitals:   09/17/19 0354 09/17/19 0600  BP: 129/81   Pulse:    Resp: 20 15  Temp: 99.9 F (37.7 C)   SpO2: 96% 96%   Tmax 99.9  Physical Exam: Cardiac: Slightly elevated rate, regular rhythm Lungs: Clear to auscultation bilaterally Incisions: Right groin and multiple right lower extremity incisions are all well approximated.  No bleeding, hematoma or drainage Extremities: Right lower extremity with moderate edema and induration of right groin. Abdomen: Soft, nondistended  CBC    Component Value Date/Time   WBC 5.9 09/16/2019 0341   RBC 3.07 (L) 09/16/2019 0341   HGB 8.2 (L) 09/16/2019 0341   HGB 13.1 08/31/2015 0929   HCT 26.3 (L) 09/16/2019 0341   HCT 38.5 08/31/2015 0929   PLT 178 09/16/2019 0341   PLT 217 08/31/2015 0929   MCV 85.7 09/16/2019 0341   MCV 93 08/31/2015 0929   MCH 26.7 09/16/2019 0341   MCHC 31.2 09/16/2019 0341   RDW 20.2 (H) 09/16/2019 0341   RDW 12.8 08/31/2015 0929   LYMPHSABS 1,376 11/20/2018 1601   LYMPHSABS 1.5 08/31/2015 0929   EOSABS 112 11/20/2018 1601   EOSABS 0.1 08/31/2015 0929   BASOSABS 37 11/20/2018 1601   BASOSABS 0.0 08/31/2015 0929    BMET    Component Value Date/Time   NA 138 09/16/2019 0341   NA 137 08/31/2015 0929   K 5.0 09/16/2019 0341   CL 112 (H) 09/16/2019 0341   CO2 17 (L) 09/16/2019 0341   GLUCOSE 109 (H) 09/16/2019 0341   BUN 33 (H) 09/16/2019 0341   BUN 11 08/31/2015 0929   CREATININE 4.00 (H) 09/16/2019 0341   CREATININE 1.67 (H) 11/20/2018 1601   CALCIUM 7.2 (L) 09/16/2019 0341   GFRNONAA 15 (L) 09/16/2019 0341   GFRNONAA 44 (L) 11/20/2018 1601   GFRAA 17 (L) 09/16/2019 0341   GFRAA 50 (L) 11/20/2018 1601   No new labs  Intake/Output Summary  (Last 24 hours) at 09/17/2019 [id number] Last data filed at 09/17/2019 0622 Gross per 24 hour  Intake 250 ml  Output 1130 ml  Net -880 ml    HOSPITAL MEDICATIONS Scheduled Meds: . aspirin EC  81 mg Oral Daily  . atorvastatin  20 mg Oral Daily  . clopidogrel  75 mg Oral Q0600  . docusate sodium  100 mg Oral Daily  . mupirocin ointment  1 application Nasal BID  . pantoprazole  40 mg Oral Daily  . polyethylene glycol  17 g Oral Daily  . sodium bicarbonate  650 mg Oral BID  . thiamine  50 mg Oral Daily   Continuous Infusions: . sodium chloride 100 mL/hr at 09/15/19 0435  . magnesium sulfate bolus IVPB     PRN Meds:.acetaminophen **OR** acetaminophen, guaiFENesin-dextromethorphan, hydrALAZINE, labetalol, magnesium sulfate bolus IVPB, metoprolol tartrate, morphine injection, ondansetron, oxyCODONE, phenol, senna-docusate, zolpidem  Assessment:  64 y.o. male is s/p: Procedure Performed: 1.  Harvest of right greater saphenous vein 2.  Extensive Right external iliac, common femoral and profunda femoral endarterectomy with vein patch angioplasty 3.  Right common femoral to anterior tibial artery bypass with composite 21mm ringed ptfe and reversed greater saphenous vein distally 4.  Right common and external iliac artery stenting with 6 x 163mm Elluvia 5.  Limited  right lower extremity angiogram  His vital signs are stable.  His pain is well controlled.  Edema is limiting his mobilization but he is motivated to ambulate.   AKI atop CKD 3 Days Post-Op  Plan: Needs to keep right lower extremity elevated on 2 pillows while in bed. Risa Grill, PA-C [facility] [phone] 09/17/2019  7:12 AM   I have independently interviewed and examined patient and agree with PA assessment and plan above. Renal function improving. Continue elevation and asa/plavix/statin.   Clarke Peretz C. [name], MD [facility] [place] Office: [phone] Pager: [phone]

## 2019-09-17 NOTE — Progress Notes (Signed)
Patient ID: Ryan [name], male   DOB: 03/31/1956, 64 y.o.   MRN: [id number]  [facility] Progress Note   Assessment/ Plan:   1. Acute kidney Injury on chronic kidney disease stage III: Underlying renal injury likely ischemic nephropathy with atrophic right kidney and acute component is likely contrast induced nephropathy versus ATN from ABLA.  He is nonoliguric and labs are pending from this morning.  He does not have significant volume overload and does not have any uremic signs or symptoms.  He is on sodium bicarbonate for anion gap metabolic acidosis. 2.  Peripheral vascular disease status post profunda femoral endarterectomy with vein patch angioplasty and right common femoral to anterior tibial artery bypass with PTFE and stenting of right common/external iliac.  Pain appears to be well controlled. 3.  Acute blood loss anemia: Improved status post transfusion with labs pending from this morning. 4.  Hypertension: Blood pressures appear to be now rising, will monitor another 24 hours prior to restarting amlodipine.  Hold RAS blockers.  Subjective:   Reports some intermittent right leg pain and optimistic about renal recovery.  Denies chest pain/shortness of breath, nausea, vomiting or dysgeusia.   Objective:   BP (!) 160/90 (BP Location: Left Arm)   Pulse (!) 105   Temp 99.5 F (37.5 C) (Oral)   Resp 16   Ht 5\' 8"  (1.727 m)   Wt 65.3 kg   SpO2 99%   BMI 21.90 kg/m   Intake/Output Summary (Last 24 hours) at 09/17/2019 0919 Last data filed at 09/17/2019 0830 Gross per 24 hour  Intake 250 ml  Output 1580 ml  Net -1330 ml   Weight change:   Physical Exam: Gen: Comfortably resting in bed, lab technician drawing blood. CVS: Pulse regular tachycardia, S1 and S2 are normal Resp: Clear to auscultation bilaterally, no rales/rhonchi Abd: Soft, flat, nontender, bowel sounds normal Ext: Right leg with incisions from recent revascularization/endarterectomy.  1+  edema.  Imaging: VAS [place] ABI WITH/WO TBI  Result Date: 09/16/2019 LOWER EXTREMITY DOPPLER STUDY Indications: Rest pain. High Risk Factors: Hypertension, hyperlipidemia.  Vascular Interventions: BYPASS GRAFT FEMORAL-POPLITEAL ARTERY (Right Leg Lower)                         Insertion Of Common and External Iliac Stent (Right                         Abdomen) done 09/14/19. Comparison Study: previous 09/03/19 Performing Technologist: Abram Sander RVS  Examination Guidelines: A complete evaluation includes at minimum, Doppler waveform signals and systolic blood pressure reading at the level of bilateral brachial, anterior tibial, and posterior tibial arteries, when vessel segments are accessible. Bilateral testing is considered an integral part of a complete examination. Photoelectric Plethysmograph (PPG) waveforms and toe systolic pressure readings are included as required and additional duplex testing as needed. Limited examinations for reoccurring indications may be performed as noted.  ABI Findings: +---------+------------------+-----+----------+--------+ Right    Rt Pressure (mmHg)IndexWaveform  Comment  +---------+------------------+-----+----------+--------+ Brachial 141                    triphasic          +---------+------------------+-----+----------+--------+ PTA      55                0.39 monophasic         +---------+------------------+-----+----------+--------+ DP       132  0.94 biphasic           +---------+------------------+-----+----------+--------+ Great Toe120               0.85                    +---------+------------------+-----+----------+--------+ +---------+------------------+-----+---------+-------+ Left     Lt Pressure (mmHg)IndexWaveform Comment +---------+------------------+-----+---------+-------+ Brachial 140                    triphasic        +---------+------------------+-----+---------+-------+ PTA      89                 0.63 biphasic         +---------+------------------+-----+---------+-------+ DP       97                0.69 biphasic         +---------+------------------+-----+---------+-------+ Great Toe154               1.09                  +---------+------------------+-----+---------+-------+ +-------+-----------+-----------+------------+------------+ ABI/TBIToday's ABIToday's TBIPrevious ABIPrevious TBI +-------+-----------+-----------+------------+------------+ Right  0.94                                           +-------+-----------+-----------+------------+------------+ Left   0.69                                           +-------+-----------+-----------+------------+------------+  Summary: Right: Resting right ankle-brachial index indicates mild right lower extremity arterial disease. The right toe-brachial index is normal. Left: Resting left ankle-brachial index indicates moderate left lower extremity arterial disease. The left toe-brachial index is abnormal.  *See table(s) above for measurements and observations.  Electronically signed by Servando Snare MD on 09/16/2019 at 4:41:43 PM.    Final     Labs: BMET Recent Labs  Lab 09/13/19 1044 09/13/19 1801 09/13/19 2244 09/14/19 0754 09/14/19 1603 09/15/19 0302 09/16/19 0341  NA 139  --  136 138  --  141 138  K 4.2  --  4.5 4.4  --  4.9 5.0  CL 110  --  108 110  --  113* 112*  CO2  --   --  20*  --   --  17* 17*  GLUCOSE 82  --  143* 91  --  128* 109*  BUN 24*  --  25* 27*  --  27* 33*  CREATININE 1.80* 1.95* 2.30* 2.60* 2.35* 2.74* 4.00*  CALCIUM  --   --  8.1*  --   --  7.1* 7.2*   CBC Recent Labs  Lab 09/13/19 1801 09/13/19 1801 09/14/19 0754 09/15/19 0526 09/15/19 1657 09/16/19 0341  WBC 4.9  --   --  4.7  --  5.9  HGB 9.8*   < > 9.2* 6.1* 9.4* 8.2*  HCT 30.4*   < > 27.0* 19.6* 29.9* 26.3*  MCV 89.1  --   --  92.9  --  85.7  PLT 238  --   --  200  --  178   < > = values in this interval not  displayed.    Medications:    . aspirin EC  81 mg Oral Daily  .  atorvastatin  20 mg Oral Daily  . clopidogrel  75 mg Oral Q0600  . docusate sodium  100 mg Oral Daily  . mupirocin ointment  1 application Nasal BID  . pantoprazole  40 mg Oral Daily  . polyethylene glycol  17 g Oral Daily  . sodium bicarbonate  650 mg Oral BID  . thiamine  50 mg Oral Daily   Elmarie Shiley, MD 09/17/2019, 9:19 AM

## 2019-09-17 NOTE — Progress Notes (Signed)
Occupational Therapy Treatment Patient Details Name: Ryan [name] MRN: [id number] DOB: Nov 10, 1955 Today's Date: 09/17/2019    History of present illness Pt is 64 yo male admitted with critical R lower extremity ischemia.  He is s/p bypass with harvest R greater saphenous vein, extensive R external iliac, common femoral and profunda femoral endarterectomy, R common fem to ant tibial artery bypass, stenting R common and external iliac artery, limited R LE angiogram on 09/14/19.  Found to have hematoma R groin 3/23 in evening.  Hgb was 6.1 and pt received 2 units PRBC. Pt with PMH including dyspnea, PVD, neuromuscular disorder, HTN, HLD.   OT comments  Pt. Seen for skilled OT treatment session.  Able to complete in room ambulation for toileting task.  Reviewed LB dressing techniques.  Pt. Reports intermittent assistance available from sister he lives with when he goes home.  Continue with LB ADLs next session.   Follow Up Recommendations  Home health OT;Supervision/Assistance - 24 hour    Equipment Recommendations  3 in 1 bedside commode;Other (comment)    Recommendations for Other Services      Precautions / Restrictions Precautions Precautions: Fall Restrictions Weight Bearing Restrictions: Yes RLE Weight Bearing: Weight bearing as tolerated       Mobility Bed Mobility               General bed mobility comments: pt received sitting in recliner and ended session in recliner  Transfers Overall transfer level: Needs assistance Equipment used: Rolling walker (2 wheeled) Transfers: Sit to/from [employer] Transfers Sit to Stand: Supervision;Min guard Stand pivot transfers: Supervision       General transfer comment: cues to stay inside the rolling walker during ambulation. often had it too far in front of him bending forward    Balance Overall balance assessment: Needs assistance Sitting-balance support: No upper extremity supported;Feet supported Sitting  balance-Leahy Scale: Good     Standing balance support: Bilateral upper extremity supported;During functional activity Standing balance-Leahy Scale: Fair                             ADL either performed or assessed with clinical judgement   ADL Overall ADL's : Needs assistance/impaired     Grooming: Standing;Supervision/safety Grooming Details (indicate cue type and reason): simulated during in room ambulation to/from b.room             Lower Body Dressing: Maximal assistance;Sit to/from stand;Sitting/lateral leans Lower Body Dressing Details (indicate cue type and reason): reports he has a "grabber" he can use if needed that his sister purchased.  reports not likely to be wearing socks if weather continues to be warm Toilet Transfer: Ambulation;RW;Supervision/safety Toilet Transfer Details (indicate cue type and reason): ambulated inside the b.room and back to recliner but declined sitting on toilet.  cues for RW management throughout session.  to stay inside of the rw       Tub/Shower Transfer Details (indicate cue type and reason): pt. declined tub transfer reports he will sponge bathe initially Functional mobility during ADLs: Supervision/safety;Rolling walker General ADL Comments: pt. reports he will sponge bathe initially, declining tub tranfer. R le remains feeling tight and also would have to be able to step over and clear tub height and not feeling comfortable with that motion right now.  will need assistance with donning socks but likely not going to wear socks at home.  does have  reacher if needed.  reports sister that he  lives with will be gone during the day and he eats food that can be prepared in the microwave.  reports no concerns with resuming this task     Vision       Perception     Praxis      Cognition Arousal/Alertness: Awake/alert Behavior During Therapy: WFL for tasks assessed/performed Overall Cognitive Status: Within Functional Limits  for tasks assessed                                          Exercises Other Exercises Other Exercises: R knee ROM, R ankle ROM (AP and circles) in sitting   Shoulder Instructions       General Comments VSS    Pertinent Vitals/ Pain       Pain Assessment: No/denies pain Faces Pain Scale: Hurts a little bit Pain Location: R leg Pain Descriptors / Indicators: Discomfort;Operative site guarding Pain Intervention(s): Limited activity within patient's tolerance;Monitored during session;Premedicated before session  Home Living                                          Prior Functioning/Environment              Frequency  Min 2X/week        Progress Toward Goals  OT Goals(current goals can now be found in the care plan section)  Progress towards OT goals: Progressing toward goals     Plan      Co-evaluation                 AM-PAC OT "6 Clicks" Daily Activity     Outcome Measure   Help from another person eating meals?: None Help from another person taking care of personal grooming?: None Help from another person toileting, which includes using toliet, bedpan, or urinal?: A Lot Help from another person bathing (including washing, rinsing, drying)?: A Lot Help from another person to put on and taking off regular upper body clothing?: None Help from another person to put on and taking off regular lower body clothing?: A Lot 6 Click Score: 18    End of Session Equipment Utilized During Treatment: Rolling walker  OT Visit Diagnosis: Unsteadiness on feet (R26.81);Other abnormalities of gait and mobility (R26.89);Muscle weakness (generalized) (M62.81);Pain Pain - Right/Left: Right Pain - part of body: Leg   Activity Tolerance Patient tolerated treatment well   Patient Left in chair;with call bell/phone within reach   Nurse Communication          Time: 1137-1145 OT Time Calculation (min): 8 min  Charges: OT General  Charges $OT Visit: 1 Visit OT Treatments $Self Care/Home Management : 8-22 mins  Sonia Baller, COTA/L Acute Rehabilitation [phone]   Janice Coffin 09/17/2019, 12:35 PM

## 2019-09-17 NOTE — Progress Notes (Signed)
Physical Therapy Treatment Patient Details Name: Ryan [name] MRN: [id number] DOB: [birth year] Today's Date: 09/17/2019    History of Present Illness Pt is 64 yo male admitted with critical R lower extremity ischemia.  He is s/p bypass with harvest R greater saphenous vein, extensive R external iliac, common femoral and profunda femoral endarterectomy, R common fem to ant tibial artery bypass, stenting R common and external iliac artery, limited R LE angiogram on 09/14/19.  Found to have hematoma R groin 3/23 in evening.  Hgb was 6.1 and pt received 2 units PRBC. Pt with PMH including dyspnea, PVD, neuromuscular disorder, HTN, HLD.    PT Comments    Pt received sitting up in recliner and agreeable to PT. Pt stating he has walked once this morning. Pt required min guard assist throughout session for transfers and ambulation. Pt is progressing well towards PT goals. Pt progressed ambulation distance with improved weight acceptance onto RLE from prior PT notes. Pt with no instances on unsteadiness LOB during ambulation and no reports of SOB or DOE with pt maintaining conversation during ambulation. Pt reporting minimal pain during session stating scars feel tight during ROM but pain has improved. VSS stable throughout session. Pt briefly sat to rest before ambulating within room to sink to wash hands prior to eating lunch. Pt stable at sink with no UE support while washing hands. Pt will benefit from cont acute PT for further mobility progression in order to maximize return to PLOF.     Follow Up Recommendations  Home health PT;Supervision for mobility/OOB     Equipment Recommendations  3in1 (PT)    Recommendations for Other Services       Precautions / Restrictions Precautions Precautions: Fall Restrictions Weight Bearing Restrictions: Yes RLE Weight Bearing: Weight bearing as tolerated    Mobility  Bed Mobility               General bed mobility comments: pt received sitting  in recliner and ended session in recliner  Transfers Overall transfer level: Needs assistance Equipment used: Rolling walker (2 wheeled) Transfers: Sit to/from [employer] Transfers Sit to Stand: Supervision;Min guard Stand pivot transfers: Supervision       General transfer comment: cues to stay inside the rolling walker during ambulation. often had it too far in front of him bending forward  Ambulation/Gait Ambulation/Gait assistance: Min guard Gait Distance (Feet): 150 Feet Assistive device: Rolling walker (2 wheeled) Gait Pattern/deviations: Step-through pattern;Decreased stride length;Decreased weight shift to right Gait velocity: decreased   General Gait Details: pt progressed ambulation distance with improved gait pattern, overall steady with no instance of unsteadiness or LOB, dec weight acceptance on RLE during stance although improving from last PT note, no reports of DOE with pt conversing during ambulation   Stairs             Wheelchair Mobility    Modified Rankin (Stroke Patients Only)       Balance Overall balance assessment: Needs assistance Sitting-balance support: No upper extremity supported;Feet supported Sitting balance-Leahy Scale: Good     Standing balance support: Bilateral upper extremity supported;During functional activity Standing balance-Leahy Scale: Fair                              Cognition Arousal/Alertness: Awake/alert Behavior During Therapy: WFL for tasks assessed/performed Overall Cognitive Status: Within Functional Limits for tasks assessed  Exercises Other Exercises Other Exercises: R knee ROM, R ankle ROM (AP and circles) in sitting    General Comments General comments (skin integrity, edema, etc.): VSS      Pertinent Vitals/Pain Pain Assessment: No/denies pain Faces Pain Scale: Hurts a little bit Pain Location: R leg Pain Descriptors /  Indicators: Discomfort;Operative site guarding Pain Intervention(s): Limited activity within patient's tolerance;Monitored during session;Premedicated before session    Home Living                      Prior Function            PT Goals (current goals can now be found in the care plan section) Progress towards PT goals: Progressing toward goals    Frequency    Min 3X/week      PT Plan Current plan remains appropriate    Co-evaluation              AM-PAC PT "6 Clicks" Mobility   Outcome Measure  Help needed turning from your back to your side while in a flat bed without using bedrails?: A Little Help needed moving from lying on your back to sitting on the side of a flat bed without using bedrails?: A Little Help needed moving to and from a bed to a chair (including a wheelchair)?: A Little Help needed standing up from a chair using your arms (e.g., wheelchair or bedside chair)?: A Little Help needed to walk in hospital room?: A Little Help needed climbing 3-5 steps with a railing? : A Lot 6 Click Score: 17    End of Session Equipment Utilized During Treatment: Gait belt Activity Tolerance: Patient tolerated treatment well Patient left: in chair;with call bell/phone within reach Nurse Communication: Mobility status PT Visit Diagnosis: Other abnormalities of gait and mobility (R26.89);Muscle weakness (generalized) (M62.81)     Time: [id number] PT Time Calculation (min) (ACUTE ONLY): 13 min  Charges:  $Therapeutic Exercise: 8-22 mins                     Zachary George PT, DPT 12:39 PM,09/17/19    [place] Marchitto Drucilla Chalet 09/17/2019, 12:35 PM

## 2019-09-17 NOTE — Progress Notes (Addendum)
MOBILITY TEAM - Progress Note   09/17/19 1020  Mobility  Activity Ambulated in room  Level of Assistance Standby assist, set-up cues, supervision of patient - no hands on  Assistive Device Front wheel walker  RLE Weight Bearing WBAT  Distance Ambulated (ft) 62 ft  Mobility Response Tolerated well  Mobility performed by Mobility specialist  Bed Position Chair   Pt received finishing up in bathroom. Agreeable to ambulating laps in room with RW and standby assist. Improved ability to WBAT through RLE and reach heel to ground. Encouraged more frequent RLE ROM/stretching. Pt left seated in recliner with RLE elevated. To work with PT and OT today.  Mabeline Caras, PT, DPT Mobility Team Pager [phone]

## 2019-09-18 LAB — RENAL FUNCTION PANEL
Albumin: 2.6 g/dL — ABNORMAL LOW (ref 3.5–5.0)
Anion gap: 9 (ref 5–15)
BUN: 18 mg/dL (ref 8–23)
CO2: 19 mmol/L — ABNORMAL LOW (ref 22–32)
Calcium: 8 mg/dL — ABNORMAL LOW (ref 8.9–10.3)
Chloride: 111 mmol/L (ref 98–111)
Creatinine, Ser: 2.15 mg/dL — ABNORMAL HIGH (ref 0.61–1.24)
GFR calc Af Amer: 37 mL/min — ABNORMAL LOW (ref >60)
GFR calc non Af Amer: 32 mL/min — ABNORMAL LOW (ref >60)
Glucose, Bld: 100 mg/dL — ABNORMAL HIGH (ref 70–99)
Phosphorus: 2.1 mg/dL — ABNORMAL LOW (ref 2.5–4.6)
Potassium: 4.6 mmol/L (ref 3.5–5.1)
Sodium: 139 mmol/L (ref 135–145)

## 2019-09-18 MED ORDER — AMLODIPINE BESYLATE 5 MG PO TABS
5.0000 mg | ORAL_TABLET | Freq: Every day | ORAL | Status: DC
  Administered 2019-09-18 – 2019-09-19 (×2): 5 mg via ORAL
  Filled 2019-09-18 (×2): qty 1

## 2019-09-18 NOTE — Progress Notes (Signed)
Patient ID: Ryan [name], male   DOB: 12-20-1955, 64 y.o.   MRN: [id number]  [facility] Progress Note   Assessment/ Plan:   1. Acute kidney Injury on chronic kidney disease stage III: Underlying renal injury likely ischemic nephropathy with atrophic right kidney and acute component is likely contrast induced nephropathy versus ATN from ABLA.  He remains nonoliguric with downtrending creatinine (baseline creatinine 1.5-1.9).  I will stop his sodium bicarbonate. 2.  Peripheral vascular disease status post profunda femoral endarterectomy with vein patch angioplasty and right common femoral to anterior tibial artery bypass with PTFE and stenting of right common/external iliac.  Pain appears to be well controlled. 3.  Acute blood loss anemia: Improved status post transfusion with labs pending from this morning. 4.  Hypertension: Blood pressures elevated, restart amlodipine and hold lisinopril for now.  I will sign off at this time and set him up for renal follow-up as an outpatient in 3 to 4 weeks with myself or Dr. Johnney Ou.  Subjective:   Having some intermittent right leg pain and denies any chest pain or shortness of breath.   Objective:   BP (!) 146/100 (BP Location: Left Arm)   Pulse 98   Temp 98.3 F (36.8 C) (Oral)   Resp 12   Ht 5\' 8"  (1.727 m)   Wt 65.3 kg   SpO2 99%   BMI 21.90 kg/m   Intake/Output Summary (Last 24 hours) at 09/18/2019 0919 Last data filed at 09/18/2019 0601 Gross per 24 hour  Intake 540 ml  Output 550 ml  Net -10 ml   Weight change:   Physical Exam: Gen: Comfortably resting in bed, watching television. CVS: Pulse regular rhythm, normal rate, S1 and S2 are normal Resp: Clear to auscultation bilaterally, no rales/rhonchi Abd: Soft, flat, nontender, bowel sounds normal Ext: Right leg with incisions from recent revascularization/endarterectomy.  1+ edema right leg, no edema left leg.  Imaging: VAS [place] ABI WITH/WO TBI  Result Date:  09/16/2019 LOWER EXTREMITY DOPPLER STUDY Indications: Rest pain. High Risk Factors: Hypertension, hyperlipidemia.  Vascular Interventions: BYPASS GRAFT FEMORAL-POPLITEAL ARTERY (Right Leg Lower)                         Insertion Of Common and External Iliac Stent (Right                         Abdomen) done 09/14/19. Comparison Study: previous 09/03/19 Performing Technologist: Abram Sander RVS  Examination Guidelines: A complete evaluation includes at minimum, Doppler waveform signals and systolic blood pressure reading at the level of bilateral brachial, anterior tibial, and posterior tibial arteries, when vessel segments are accessible. Bilateral testing is considered an integral part of a complete examination. Photoelectric Plethysmograph (PPG) waveforms and toe systolic pressure readings are included as required and additional duplex testing as needed. Limited examinations for reoccurring indications may be performed as noted.  ABI Findings: +---------+------------------+-----+----------+--------+ Right    Rt Pressure (mmHg)IndexWaveform  Comment  +---------+------------------+-----+----------+--------+ Brachial 141                    triphasic          +---------+------------------+-----+----------+--------+ PTA      55                0.39 monophasic         +---------+------------------+-----+----------+--------+ DP       132  0.94 biphasic           +---------+------------------+-----+----------+--------+ Great Toe120               0.85                    +---------+------------------+-----+----------+--------+ +---------+------------------+-----+---------+-------+ Left     Lt Pressure (mmHg)IndexWaveform Comment +---------+------------------+-----+---------+-------+ Brachial 140                    triphasic        +---------+------------------+-----+---------+-------+ PTA      89                0.63 biphasic          +---------+------------------+-----+---------+-------+ DP       97                0.69 biphasic         +---------+------------------+-----+---------+-------+ Great Toe154               1.09                  +---------+------------------+-----+---------+-------+ +-------+-----------+-----------+------------+------------+ ABI/TBIToday's ABIToday's TBIPrevious ABIPrevious TBI +-------+-----------+-----------+------------+------------+ Right  0.94                                           +-------+-----------+-----------+------------+------------+ Left   0.69                                           +-------+-----------+-----------+------------+------------+  Summary: Right: Resting right ankle-brachial index indicates mild right lower extremity arterial disease. The right toe-brachial index is normal. Left: Resting left ankle-brachial index indicates moderate left lower extremity arterial disease. The left toe-brachial index is abnormal.  *See table(s) above for measurements and observations.  Electronically signed by Servando Snare MD on 09/16/2019 at 4:41:43 PM.    Final     Labs: BMET Recent Labs  Lab 09/13/19 1044 09/13/19 1801 09/13/19 2244 09/14/19 [id number] 09/14/19 1603 09/15/19 0302 09/16/19 0341 09/17/19 0926 09/18/19 0336  NA 139  --  136 138  --  141 138 138 139  K 4.2  --  4.5 4.4  --  4.9 5.0 4.1 4.6  CL 110  --  108 110  --  113* 112* 113* 111  CO2  --   --  20*  --   --  17* 17* 18* 19*  GLUCOSE 82  --  143* 91  --  128* 109* 192* 100*  BUN 24*  --  25* 27*  --  27* 33* 22 18  CREATININE 1.80*   < > 2.30* 2.60* 2.35* 2.74* 4.00* 2.47* 2.15*  CALCIUM  --   --  8.1*  --   --  7.1* 7.2* 7.6* 8.0*  PHOS  --   --   --   --   --   --   --  2.2* 2.1*   < > = values in this interval not displayed.   CBC Recent Labs  Lab 09/13/19 1801 09/14/19 0754 09/15/19 0526 09/15/19 1657 09/16/19 0341 09/17/19 0926  WBC 4.9  --  4.7  --  5.9 5.5  HGB 9.8*   < >  6.1* 9.4* 8.2* 7.4*  HCT 30.4*   < > 19.6* 29.9* 26.3*  23.1*  MCV 89.1  --  92.9  --  85.7 86.5  PLT 238  --  200  --  178 168   < > = values in this interval not displayed.    Medications:    . aspirin EC  81 mg Oral Daily  . atorvastatin  20 mg Oral Daily  . clopidogrel  75 mg Oral Q0600  . docusate sodium  100 mg Oral Daily  . mupirocin ointment  1 application Nasal BID  . pantoprazole  40 mg Oral Daily  . polyethylene glycol  17 g Oral Daily  . sodium bicarbonate  650 mg Oral BID  . thiamine  50 mg Oral Daily   Elmarie Shiley, MD 09/18/2019, 9:19 AM

## 2019-09-18 NOTE — Progress Notes (Signed)
   VASCULAR SURGERY ASSESSMENT & PLAN:   POD 4 RIGHT FEMORAL TO ANTERIOR TIBIAL ARTERY BYPASS WITH COMPOSITE PTFE VEIN GRAFT AND RIGHT COMMON AND EXTERNAL ILIAC ARTERY STENT: He has a brisk anterior tibial signal with the Doppler.  DISPOSITION: The plan is for rehab next week.  CHRONIC KIDNEY DISEASE: His creatinine is trending down.  Today's creatinine is 2.15.   SUBJECTIVE:   No complaints this morning.  PHYSICAL EXAM:   Vitals:   09/17/19 0732 09/17/19 2033 09/18/19 0419 09/18/19 0600  BP: (!) 160/90 (!) 165/95 (!) 168/105 (!) 146/100  Pulse: (!) 105 (!) 103 98 98  Resp: 16 15 14 12   Temp: 99.5 F (37.5 C) 99.2 F (37.3 C) 98.3 F (36.8 C)   TempSrc: Oral Oral Oral   SpO2: 99% 100% 99%   Weight:      Height:       The moderate swelling in his right leg is unchanged. He has a brisk anterior tibial and dorsalis pedis signal with the Doppler. His incisions look fine.  LABS:   Lab Results  Component Value Date   WBC 5.5 09/17/2019   HGB 7.4 (L) 09/17/2019   HCT 23.1 (L) 09/17/2019   MCV 86.5 09/17/2019   PLT 168 09/17/2019   Lab Results  Component Value Date   CREATININE 2.15 (H) 09/18/2019   PROBLEM LIST:    Active Problems:   PAD (peripheral artery disease) (HCC)   CURRENT MEDS:   . aspirin EC  81 mg Oral Daily  . atorvastatin  20 mg Oral Daily  . clopidogrel  75 mg Oral Q0600  . docusate sodium  100 mg Oral Daily  . mupirocin ointment  1 application Nasal BID  . pantoprazole  40 mg Oral Daily  . polyethylene glycol  17 g Oral Daily  . sodium bicarbonate  650 mg Oral BID  . thiamine  50 mg Oral Daily    Deitra Mayo Office: [phone] 09/18/2019

## 2019-09-19 LAB — CBC
HCT: 26.5 % — ABNORMAL LOW (ref 39.0–52.0)
Hemoglobin: 8.2 g/dL — ABNORMAL LOW (ref 13.0–17.0)
MCH: 26.5 pg (ref 26.0–34.0)
MCHC: 30.9 g/dL (ref 30.0–36.0)
MCV: 85.8 fL (ref 80.0–100.0)
Platelets: 250 10*3/uL (ref 150–400)
RBC: 3.09 MIL/uL — ABNORMAL LOW (ref 4.22–5.81)
RDW: 17.6 % — ABNORMAL HIGH (ref 11.5–15.5)
WBC: 5.9 10*3/uL (ref 4.0–10.5)
nRBC: 0 % (ref 0.0–0.2)

## 2019-09-19 LAB — RENAL FUNCTION PANEL
Albumin: 2.7 g/dL — ABNORMAL LOW (ref 3.5–5.0)
Anion gap: 8 (ref 5–15)
BUN: 16 mg/dL (ref 8–23)
CO2: 21 mmol/L — ABNORMAL LOW (ref 22–32)
Calcium: 8.4 mg/dL — ABNORMAL LOW (ref 8.9–10.3)
Chloride: 108 mmol/L (ref 98–111)
Creatinine, Ser: 1.76 mg/dL — ABNORMAL HIGH (ref 0.61–1.24)
GFR calc Af Amer: 47 mL/min — ABNORMAL LOW (ref >60)
GFR calc non Af Amer: 41 mL/min — ABNORMAL LOW (ref >60)
Glucose, Bld: 136 mg/dL — ABNORMAL HIGH (ref 70–99)
Phosphorus: 2.3 mg/dL — ABNORMAL LOW (ref 2.5–4.6)
Potassium: 4.3 mmol/L (ref 3.5–5.1)
Sodium: 137 mmol/L (ref 135–145)

## 2019-09-19 MED ORDER — OXYCODONE-ACETAMINOPHEN 10-325 MG PO TABS: 1.0000 | ORAL_TABLET | Freq: Four times a day (QID) | ORAL | 0 refills | Status: DC | PRN

## 2019-09-19 MED ORDER — CLOPIDOGREL BISULFATE 75 MG PO TABS: 75.0000 mg | ORAL_TABLET | Freq: Every day | ORAL | 5 refills | Status: DC

## 2019-09-19 NOTE — Discharge Summary (Signed)
Discharge Summary     Ryan [name] 01-07-1956 64 y.o. male  [id number]  Admission Date: 09/13/2019  Discharge Date: 09/19/2019 Physician: Thomes Lolling*  Admission Diagnosis: PAD (peripheral artery disease) ([place]) [I73.9]  HPI:   This is a 64 y.o. male with history of previous stents in his right lower extremity.  Now has critical right lower extremity ischemia with rest pain.  He is indicated for angiography with possible intervention.  This revealed need for right common femoral to either the peroneal or anterior tibial artery. He was admitted for LE bypass.  Hospital Course:  The patient was  taken to the operating room on 09/14/2019 and underwent the following: 1.  Harvest of right greater saphenous vein 2.  Extensive Right external iliac, common femoral and profunda femoral endarterectomy with vein patch angioplasty 3.  Right common femoral to anterior tibial artery bypass with composite 41mm ringed ptfe and reversed greater saphenous vein distally 4.  Right common and external iliac artery stenting with 6 x 181mm Elluvia 5.  Limited right lower extremity angiogram  Findings: Arteriography: Right SFA and popliteal stents are occluded.  Right posterior tibial artery stent occludes.  Appears to reconstitute anterior tibial and peroneal in the mid lower leg.  Posterior tibial artery does not appear to go to the foot.  Surgical:  Patient had minimal pulsation in the right common femoral artery.  On open exploration of this was severely thickened.  Endarterectomy was carried to the external leg arteries or under the inguinal ligament has been arranged.  Ultimately we had to stent to develop inflow all the way from the common iliac artery on the right to just approximately 2 cm cephalad of the humeral head and the external iliac artery on the right.  The vein was marginal for use although we did harvest the entirety of the greater saphenous vein it was not usable after  dilating.  We did use one segment for vein patch angioplasty of our common femoral endarterectomy and another segment 4 composite graft was approximately 10 cm of vein used for the distal anastomosis.  After endarterectomy and bypass we had very weak inflow.  With stenting we develop strong inflow and good signal at the foot and anterior tibial artery to the dorsalis pedis artery.  At completion angiography did not demonstrate any significant flow into the foot however with a very strong signal that was absent with compression of the graft.   The pt tolerated the procedure well and was transported to the PACU in good condition.   He was maintained on heparin infusion post-operatively.   POD 0: His vital signs were stable and he was afebrile.  Creatinine was 2.60 and baseline last year was 1.67.  IV fluid hydration was continued.  Brisk pedal Doppler signals present.  Postoperative hemoglobin 9.2.  That evening, nursing staff noticed increasing swelling of his right groin.  Dr. Scot Dock assessed his groin and noted there was moderate swelling at the proximal tunnel site.  Several hours later, he developed sudden onset of right flank pain.  CT scan of the abdomen pelvis did not reveal significant retroperitoneal hemorrhage.  No need for urgent exploration.  POD 1: His hemoglobin fell to 6.1 and he received 2 units of packed red blood cells.  Creatinine rose to 2.74.  Right lower extremity edema, well perfused.  Physical and occupational therapy evaluation and treatment carried out.  POD 2: Significant right lower extremity edema noted.  This involved his scrotum and penis.  Right lower extremity elevated on several pillows.  Attention to foreskin inversion.  Small right groin hematoma.  Due to elevation in his creatinine, nephrology was consulted.  POD 3: Creatinine now 4.0.  No indications for hemodialysis.  Hemoglobin 8.2 after transfusion.  Incisions continue to heal and no worsening of right groin  hematoma.  POD 4: Edema of scrotum and penis improved.  Still with moderate lower extremity edema.  Leg well perfused.  Creatinine 2.15.  Afebrile.  Lisinopril held.  POD 5: The patient has continued to improve.  His creatinine today is 1.76.  His pain is controlled.  Incisions healing and right foot is well perfused.  The remainder of the hospital course consisted of increasing mobilization and increasing intake of solids without difficulty.  He is ready for discharge home with home health/PT.  He will resume Eliquis and Plavix will be added.  Follow-up labs as outpatient.  CBC    Component Value Date/Time   WBC 5.5 09/17/2019 0926   RBC 2.67 (L) 09/17/2019 0926   HGB 7.4 (L) 09/17/2019 0926   HGB 13.1 08/31/2015 0929   HCT 23.1 (L) 09/17/2019 0926   HCT 38.5 08/31/2015 0929   PLT 168 09/17/2019 0926   PLT 217 08/31/2015 0929   MCV 86.5 09/17/2019 0926   MCV 93 08/31/2015 0929   MCH 27.7 09/17/2019 0926   MCHC 32.0 09/17/2019 0926   RDW 19.2 (H) 09/17/2019 0926   RDW 12.8 08/31/2015 0929   LYMPHSABS 1,376 11/20/2018 1601   LYMPHSABS 1.5 08/31/2015 0929   EOSABS 112 11/20/2018 1601   EOSABS 0.1 08/31/2015 0929   BASOSABS 37 11/20/2018 1601   BASOSABS 0.0 08/31/2015 0929    BMET    Component Value Date/Time   NA 137 09/19/2019 0404   NA 137 08/31/2015 0929   K 4.3 09/19/2019 0404   CL 108 09/19/2019 0404   CO2 21 (L) 09/19/2019 0404   GLUCOSE 136 (H) 09/19/2019 0404   BUN 16 09/19/2019 0404   BUN 11 08/31/2015 0929   CREATININE 1.76 (H) 09/19/2019 0404   CREATININE 1.67 (H) 11/20/2018 1601   CALCIUM 8.4 (L) 09/19/2019 0404   GFRNONAA 41 (L) 09/19/2019 0404   GFRNONAA 44 (L) 11/20/2018 1601   GFRAA 47 (L) 09/19/2019 0404   GFRAA 50 (L) 11/20/2018 1601     Discharge Instructions    Discharge patient   Complete by: As directed    Discharge disposition: 01-Home or Self Care   Discharge patient date: 09/19/2019      Discharge Diagnosis:  PAD (peripheral artery  disease) ([place]) [I73.9]  Secondary Diagnosis: Patient Active Problem List   Diagnosis Date Noted   Ischemia of right lower extremity 01/28/2019   Iron deficiency anemia 11/23/2018   Stage 3 chronic kidney disease 10/21/2018   Atherosclerosis of native arteries of extremity with rest pain ([place]) 10/13/2018   PAD (peripheral artery disease) ([place]) 07/21/2018   Ischemic leg 06/25/2018   Claudication ([place]) 03/17/2018   B12 deficiency 03/12/2018   Vitamin B1 deficiency 03/12/2018   Varicose veins of leg with pain, right 03/12/2018   Enlarged thoracic aorta ([place]) 02/24/2018   Alcoholism /alcohol abuse ([place]) 02/24/2018   Paresthesia 02/24/2018   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)    Shortness of breath dyspnea      Allergies as of 09/19/2019   No Known Allergies     Medication List    STOP taking these medications   lisinopril 20 MG tablet Commonly known as: ZESTRIL     TAKE these medications   acetaminophen 500 MG tablet Commonly known as: TYLENOL Take 1 tablet (500 mg total) by mouth 2 (two) times daily. What changed:   when to take this  reasons to take this   amLODipine 2.5 MG tablet Commonly known as: NORVASC Take 1 tablet (2.5 mg total) by mouth daily.   apixaban 5 MG Tabs tablet Commonly known as: ELIQUIS Take 1 tablet (5 mg total) by mouth 2 (two) times daily.   aspirin EC 81 MG tablet Take 1 tablet (81 mg total) by mouth daily.   atorvastatin 20 MG tablet Commonly known as:  LIPITOR Take 1 tablet by mouth once daily   clopidogrel 75 MG tablet Commonly known as: PLAVIX Take 1 tablet (75 mg total) by mouth daily at 6 (six) AM. Start taking on: September 20, 2019   Fluticasone-Umeclidin-Vilant 100-62.5-25 MCG/INH Aepb Commonly known as: Trelegy Ellipta Inhale 1 puff into the lungs daily. What changed:   when to take this  reasons to take this   ondansetron 4 MG disintegrating tablet Commonly known as: Zofran ODT Take 1 tablet (4 mg total) by mouth every 8 (eight) hours as needed. What changed: reasons to take this   oxyCODONE-acetaminophen 10-325 MG tablet Commonly known as: Percocet Take 1 tablet by mouth every 6 (six) hours as needed for pain.   thiamine 50 MG tablet Commonly known as: VITAMIN B-1 Take 1 tablet (50 mg total) by mouth daily.   Vitamin B-12 1000 MCG Subl Place 1 tablet (1,000 mcg total) under the tongue daily. What changed: how much to take            Durable Medical Equipment  (From admission, onward)         Start     Ordered   09/15/19 1611  For home use only DME 3 n 1  Once     09/15/19 1611          Discharge Instructions: [facility] Discharge instructions Lower Extremity Bypass Surgery  Please refer to the following instruction for your post-procedure care. Your surgeon or physician assistant will discuss any changes with you.  Activity  You are encouraged to walk as much as you can. You can slowly return to normal activities during the month after your surgery. Avoid strenuous activity and heavy lifting until your doctor tells you it's OK. Avoid activities such as vacuuming or swinging a golf club. Do not drive until your doctor give the OK and you are no longer taking prescription pain medications. It is also normal to have difficulty with sleep habits, eating and bowel movement after surgery. These will go away with time.  Bathing/Showering  You may shower after you go  home. Do not soak in a bathtub, hot tub, or swim until the incision heals completely.  Incision Care  Clean your incision with mild soap and water. Shower every day. Pat the area dry with a clean towel. You do not need  a bandage unless otherwise instructed. Do not apply any ointments or creams to your incision. If you have open wounds you will be instructed how to care for them or a visiting nurse may be arranged for you. If you have staples or sutures along your incision they will be removed at your post-op appointment. You may have skin glue on your incision. Do not peel it off. It will come off on its own in about one week.  Wash the groin wound with soap and water daily and pat dry. (No tub bath-only shower)  Then put a dry gauze or washcloth in the groin to keep this area dry to help prevent wound infection.  Do this daily and as needed.  Do not use Vaseline or neosporin on your incisions.  Only use soap and water on your incisions and then protect and keep dry.  Diet  Resume your normal diet. There are no special food restrictions following this procedure. A low fat/ low cholesterol diet is recommended for all patients with vascular disease. In order to heal from your surgery, it is CRITICAL to get adequate nutrition. Your body requires vitamins, minerals, and protein. Vegetables are the best source of vitamins and minerals. Vegetables also provide the perfect balance of protein. Processed food has little nutritional value, so try to avoid this.  Medications  Resume taking all your medications unless your doctor or Physician Assistant tells you not to. If your incision is causing pain, you may take over-the-counter pain relievers such as acetaminophen (Tylenol). If you were prescribed a stronger pain medication, please aware these medication can cause nausea and constipation. Prevent nausea by taking the medication with a snack or meal. Avoid constipation by drinking plenty of fluids and eating  foods with high amount of fiber, such as fruits, vegetables, and grains. Take Colace 100 mg (an over-the-counter stool softener) twice a day as needed for constipation.  Do not take Tylenol if you are taking prescription pain medications.  Follow Up  Our office will schedule a follow up appointment 2-3 weeks following discharge.  Please call us immediately for any of the following conditions  Severe or worsening pain in your legs or feet while at rest or while walking Increase pain, redness, warmth, or drainage (pus) from your incision site(s)  Fever of 101 degree or higher  The swelling in your leg with the bypass suddenly worsens and becomes more painful than when you were in the hospital  If you have been instructed to feel your graft pulse then you should do so every day. If you can no longer feel this pulse, call the office immediately. Not all patients are given this instruction.   Leg swelling is common after leg bypass surgery.  The swelling should improve over a few months following surgery. To improve the swelling, you may elevate your legs above the level of your heart while you are sitting or resting. Your surgeon or physician assistant may ask you to apply an ACE wrap or wear compression (TED) stockings to help to reduce swelling.  Reduce your risk of vascular disease  Stop smoking. If you would like help call [facility] at 1-800-QUIT-NOW (484)457-5705) or [place] at [phone].   Manage your cholesterol  Maintain a desired weight  Control your diabetes weight  Control your diabetes  Keep your blood pressure down   If you have any questions, please call the office at [phone]   Prescriptions given: 1.  Roxicet #30 No Refill   Disposition:  Home with [facility]  Patient's condition: is Good  Follow up: 1. Dr. [name] in 2 weeks   Risa Grill, PA-C [facility] [phone] 09/19/2019  9:34 AM  - For VQI Registry use ---    Post-op:  Wound infection: No  Graft infection: No  Transfusion: Yes    If yes, 2 units given New Arrhythmia: No Ipsilateral amputation: No, [ ]  Minor, [ ]  BKA, [ ]  AKA Discharge patency: [x ] Primary, [ ]  Primary assisted, [ ]  Secondary, [ ]  Occluded Patency judged by: [x ] Dopper only, [ ]  Palpable graft pulse, []  Palpable distal pulse, [x ] ABI inc. > 0.15, [ ]  Duplex Discharge ABI: R 0.94, L 0.69 D/C Ambulatory Status: Ambulatory with Assistance  Complications: MI: No, [ ]  Troponin only, [ ]  EKG or Clinical CHF: No Resp failure:No, [ ]  Pneumonia, [ ]  Ventilator Chg in renal function: No, [ ]  Inc. Cr > 0.5, [ ]  Temp. Dialysis,  [ ]  Permanent dialysis Stroke: No, [ ]  Minor, [ ]  Major Return to OR: No  Reason for return to OR: [ ]  Bleeding, [ ]  Infection, [ ]  Thrombosis, [ ]  Revision  Discharge medications: Statin use:  yes ASA use:  yes Plavix use: yes Beta blocker use: no CCB use:  Yes ACEI use:   no ARB use:  no Coumadin use: no apixaban (yes)

## 2019-09-19 NOTE — Discharge Instructions (Signed)
 [facility]  Discharge instructions  Lower Extremity Bypass Surgery  Please refer to the following instruction for your post-procedure care. Your surgeon or physician assistant will discuss any changes with you.  Activity  You are encouraged to walk as much as you can. You can slowly return to normal activities during the month after your surgery. Avoid strenuous activity and heavy lifting until your doctor tells you it's OK. Avoid activities such as vacuuming or swinging a golf club. Do not drive until your doctor give the OK and you are no longer taking prescription pain medications. It is also normal to have difficulty with sleep habits, eating and bowel movement after surgery. These will go away with time.  Bathing/Showering  You may shower after you go home. Do not soak in a bathtub, hot tub, or swim until the incision heals completely.  Incision Care  Clean your incision with mild soap and water. Shower every day. Pat the area dry with a clean towel. You do not need a bandage unless otherwise instructed. Do not apply any ointments or creams to your incision. If you have open wounds you will be instructed how to care for them or a visiting nurse may be arranged for you. If you have staples or sutures along your incision they will be removed at your post-op appointment. You may have skin glue on your incision. Do not peel it off. It will come off on its own in about one week. If you have a great deal of moisture in your groin, use a gauze help keep this area dry.  Diet  Resume your normal diet. There are no special food restrictions following this procedure. A low fat/ low cholesterol diet is recommended for all patients with vascular disease. In order to heal from your surgery, it is CRITICAL to get adequate nutrition. Your body requires vitamins, minerals, and protein. Vegetables are the best source of vitamins and minerals. Vegetables also provide the  perfect balance of protein. Processed food has little nutritional value, so try to avoid this.  Medications  Resume taking all your medications unless your doctor or nurse practitioner tells you not to. If your incision is causing pain, you may take over-the-counter pain relievers such as acetaminophen (Tylenol). If you were prescribed a stronger pain medication, please aware these medication can cause nausea and constipation. Prevent nausea by taking the medication with a snack or meal. Avoid constipation by drinking plenty of fluids and eating foods with high amount of fiber, such as fruits, vegetables, and grains. Take Colase 100 mg (an over-the-counter stool softener) twice a day as needed for constipation. Do not take Tylenol if you are taking prescription pain medications.  Follow Up  Our office will schedule a follow up appointment 2-3 weeks following discharge.  Please call us immediately for any of the following conditions  Severe or worsening pain in your legs or feet while at rest or while walking Increase pain, redness, warmth, or drainage (pus) from your incision site(s) Fever of 101 degree or higher The swelling in your leg with the bypass suddenly worsens and becomes more painful than when you were in the hospital If you have been instructed to feel your graft pulse then you should do so every day. If you can no longer feel this pulse, call the office immediately. Not all patients are given this instruction.  Leg swelling is common after leg bypass surgery.  The swelling should improve over a few months   following surgery. To improve the swelling, you may elevate your legs above the level of your heart while you are sitting or resting. Your surgeon or physician assistant may ask you to apply an ACE wrap or wear compression (TED) stockings to help to reduce swelling.  Reduce your risk of vascular disease  Stop smoking. If you would like help call [facility] at 1-800-QUIT-NOW  (1-800-784-8669) or [place] at 336-586-4000.  Manage your cholesterol Maintain a desired weight Control your diabetes weight Control your diabetes Keep your blood pressure down  If you have any questions, please call the office at [phone]   

## 2019-09-19 NOTE — Progress Notes (Addendum)
   VASCULAR SURGERY ASSESSMENT & PLAN:   POD 5 RIGHT FEMORAL TO ANTERIOR TIBIAL ARTERY BYPASS WITH COMPOSITE PTFE VEIN GRAFT AND RIGHT COMMON AND EXTERNAL ILIAC ARTERY STENT: He has a brisk anterior tibial signal with the Doppler.  DISPOSITION: The plan is for home today with HH/PT.  CHRONIC KIDNEY DISEASE: His creatinine is trending down.  Today's creatinine is 1.76.  ANEMIA: Acute blood loss atop CKD stage III. Check hemoglobin prior to follow-up visit.  Appreciate Dr. Posey Pronto  SUBJECTIVE:   Feels well. Ambulating with minimal assistance  PHYSICAL EXAM:   Vitals:   09/18/19 1947 09/19/19 0500 09/19/19 0558 09/19/19 0653  BP: (!) 152/86 (!) 184/99 (!) 164/104 (!) 159/104  Pulse: 99 95  69  Resp: 20 15  15   Temp: 97.8 F (36.6 C) 98 F (36.7 C)    TempSrc: Oral Oral    SpO2: 94% 99%    Weight:      Height:       General: alert and oriented x 4. In NAD Card: RRR Resp: nonlabored RLE: edema slightly improved. Brisk ATA Doppler signal.  All incision well approximated and without drainage or signs of infection.   LABS:   Lab Results  Component Value Date   WBC 5.5 09/17/2019   HGB 7.4 (L) 09/17/2019   HCT 23.1 (L) 09/17/2019   MCV 86.5 09/17/2019   PLT 168 09/17/2019   Lab Results  Component Value Date   CREATININE 1.76 (H) 09/19/2019   Lab Results  Component Value Date   INR 1.2 09/13/2019   CBG (last 3)  No results for input(s): GLUCAP in the last 72 hours.  PROBLEM LIST:    Active Problems:   PAD (peripheral artery disease) (HCC)   CURRENT MEDS:   . amLODipine  5 mg Oral Daily  . aspirin EC  81 mg Oral Daily  . atorvastatin  20 mg Oral Daily  . clopidogrel  75 mg Oral Q0600  . docusate sodium  100 mg Oral Daily  . pantoprazole  40 mg Oral Daily  . polyethylene glycol  17 g Oral Daily  . thiamine  50 mg Oral Daily    Deitra Mayo Office: [phone] 09/19/2019

## 2019-09-19 NOTE — Plan of Care (Signed)
  Problem: Education: Goal: Knowledge of General Education information will improve Description: Including pain rating scale, medication(s)/side effects and non-pharmacologic comfort measures Outcome: Adequate for Discharge   Problem: Health Behavior/Discharge Planning: Goal: Ability to manage health-related needs will improve Outcome: Adequate for Discharge   Problem: Clinical Measurements: Goal: Ability to maintain clinical measurements within normal limits will improve Outcome: Adequate for Discharge Goal: Will remain free from infection Outcome: Adequate for Discharge Goal: Diagnostic test results will improve Outcome: Adequate for Discharge Goal: Respiratory complications will improve Outcome: Adequate for Discharge Goal: Cardiovascular complication will be avoided Outcome: Adequate for Discharge   Problem: Coping: Goal: Level of anxiety will decrease Outcome: Adequate for Discharge   Problem: Elimination: Goal: Will not experience complications related to bowel motility Outcome: Adequate for Discharge Goal: Will not experience complications related to urinary retention Outcome: Adequate for Discharge   Problem: Pain Managment: Goal: General experience of comfort will improve Outcome: Adequate for Discharge   Problem: Safety: Goal: Ability to remain free from injury will improve Outcome: Adequate for Discharge   Problem: Skin Integrity: Goal: Risk for impaired skin integrity will decrease Outcome: Adequate for Discharge   Problem: Education: Goal: Knowledge of prescribed regimen will improve Outcome: Adequate for Discharge   Problem: Activity: Goal: Ability to tolerate increased activity will improve Outcome: Adequate for Discharge   Problem: Clinical Measurements: Goal: Signs and symptoms of graft occlusion will improve Outcome: Adequate for Discharge   Problem: Skin Integrity: Goal: Demonstration of wound healing without infection will improve Outcome:  Adequate for Discharge   Problem: Education: Goal: Knowledge of prescribed regimen will improve Outcome: Adequate for Discharge   Problem: Activity: Goal: Ability to tolerate increased activity will improve Outcome: Adequate for Discharge   Problem: Acute Rehab OT Goals (only OT should resolve) Goal: Pt. Will Perform Lower Body Bathing Outcome: Adequate for Discharge Goal: Pt. Will Perform Lower Body Dressing Outcome: Adequate for Discharge Goal: Pt. Will Transfer To Toilet Outcome: Adequate for Discharge Goal: Pt. Will Perform Tub/Shower Transfer Outcome: Adequate for Discharge   Problem: Acute Rehab PT Goals(only PT should resolve) Goal: Pt Will Go Supine/Side To Sit Outcome: Adequate for Discharge Goal: Pt Will Go Sit To Supine/Side Outcome: Adequate for Discharge Goal: Patient Will Transfer Sit To/From Stand Outcome: Adequate for Discharge Goal: Pt Will Transfer Bed To Chair/Chair To Bed Outcome: Adequate for Discharge Goal: Pt Will Ambulate Outcome: Adequate for Discharge Goal: Pt Will Go Up/Down Stairs Outcome: Adequate for Discharge

## 2019-09-19 NOTE — Progress Notes (Signed)
Discharge instructions reviewed with patient and daughter via tele - messenger.  Discharge instructions included, but were not all inclusive of, the following:  prescription medications, alternative pain relieving messaging, when to call the MD, .activity recommendations, activity restrictions, incision care, infection control issues, keeping incisions clean, purpose of anticoagulation therapy, etc.  Comprehension of presented material ascertained via "teach-back" technique.

## 2019-09-20 ENCOUNTER — Other Ambulatory Visit: Payer: Self-pay | Admitting: Physician Assistant

## 2019-09-20 DIAGNOSIS — I10 Essential (primary) hypertension: Secondary | ICD-10-CM

## 2019-09-20 MED ORDER — CLOPIDOGREL BISULFATE 75 MG PO TABS: 75.0000 mg | ORAL_TABLET | Freq: Every day | ORAL | 2 refills | Status: DC

## 2019-09-20 MED ORDER — AMLODIPINE BESYLATE 2.5 MG PO TABS: 2.5000 mg | ORAL_TABLET | Freq: Every day | ORAL | 0 refills | Status: DC

## 2019-09-21 ENCOUNTER — Telehealth: Payer: Self-pay

## 2019-09-21 NOTE — Telephone Encounter (Signed)
Pt called with c/o swelling; he feels it is resolving and getting better daily. He also feels a small non tender "lump" at groin that is not red or draining. Pt will keep an eye on it and call us back if it does not improve or worsens. Discussed his call with PA's. Pt verbalized understanding.

## 2019-09-23 ENCOUNTER — Telehealth: Payer: Self-pay | Admitting: Emergency Medicine

## 2019-09-23 NOTE — Telephone Encounter (Signed)
Physical Therapist called regarding MR. Noguera BP elevated at 180/108. Patient was recently hospitalized with bypass graft. He stated Dr.Cain had took him off Lisinopril and he was only taking 2.5mg  of Amlodipine. Per Dr.sowles patient is to increase Amlodipine to 5mg  a day and make hospital follow up. Patient notified

## 2019-10-02 ENCOUNTER — Ambulatory Visit: Payer: BLUE CROSS/BLUE SHIELD | Attending: Internal Medicine

## 2019-10-02 DIAGNOSIS — Z23 Encounter for immunization: Secondary | ICD-10-CM

## 2019-10-02 NOTE — Progress Notes (Signed)
   Covid-19 Vaccination Clinic  Name:  Ryan [name]    MRN: [id number] DOB: 1956-02-20  10/02/2019  [name] was observed post Covid-19 immunization for 15 minutes without incident. He was provided with Vaccine Information Sheet and instruction to access the V-Safe system.   [name] was instructed to call 911 with any severe reactions post vaccine: Marland Kitchen Difficulty breathing  . Swelling of face and throat  . A fast heartbeat  . A bad rash all over body  . Dizziness and weakness   Immunizations Administered    Name Date Dose VIS Date Route   Pfizer COVID-19 Vaccine 10/02/2019 12:12 PM 0.3 mL 06/04/2019 Intramuscular   Manufacturer: [place]   Lot: [id number]   [place]: [id number]

## 2019-10-08 ENCOUNTER — Ambulatory Visit (INDEPENDENT_AMBULATORY_CARE_PROVIDER_SITE_OTHER): Payer: Self-pay | Admitting: Vascular Surgery

## 2019-10-08 ENCOUNTER — Other Ambulatory Visit: Payer: Self-pay

## 2019-10-08 ENCOUNTER — Encounter: Payer: Self-pay | Admitting: Vascular Surgery

## 2019-10-08 VITALS — BP 184/109 | HR 97 | Temp 97.8°F | Resp 20 | Ht 68.0 in | Wt 147.9 lb

## 2019-10-08 DIAGNOSIS — I739 Peripheral vascular disease, unspecified: Secondary | ICD-10-CM

## 2019-10-08 NOTE — Progress Notes (Signed)
    Subjective:     Patient ID: Ryan [name], male   DOB: [birth year], 64 y.o.   MRN: [id number]  HPI 63 year old male with previous history of right lower extremity stenting.  He subsequently had rest pain with severely depressed ABIs.  He has now undergone right femoral to anterior tibial artery bypassing.  He does have persistent swelling.  Overall his foot feels better.  He is currently on aspirin and Eliquis and also takes Lipitor.   Review of Systems Right lower extremity swelling    Objective:   Physical Exam Vitals:   10/08/19 1011  BP: (!) 184/109  Pulse: 97  Resp: 20  Temp: 97.8 F (36.6 C)  SpO2: 100%   [id number] Non labored respirations Right groin incision is intact there is some fibrinous exudate and a dry dressing was placed Right lower extremity incisions otherwise clean dry intact Swelling improved from previous with 2+ edema Palp dorsalis pedis pulse on the right    Assessment/plan     64 year old male status post right femoral to anterior tibial artery bypass with composite graft and vein.  We will follow him up in 4 to 6 weeks with right lower extremity duplex and ABIs.  If he has any issues prior to that we will certainly see him sooner.        Beena Catano C. [name], MD [facility] Office: [phone] Pager: [phone]

## 2019-10-11 ENCOUNTER — Other Ambulatory Visit: Payer: Self-pay | Admitting: *Deleted

## 2019-10-11 DIAGNOSIS — I739 Peripheral vascular disease, unspecified: Secondary | ICD-10-CM

## 2019-10-13 ENCOUNTER — Other Ambulatory Visit: Payer: Self-pay

## 2019-10-13 ENCOUNTER — Encounter: Payer: Self-pay | Admitting: Family Medicine

## 2019-10-13 ENCOUNTER — Ambulatory Visit (INDEPENDENT_AMBULATORY_CARE_PROVIDER_SITE_OTHER): Payer: BLUE CROSS/BLUE SHIELD | Admitting: Family Medicine

## 2019-10-13 VITALS — BP 144/92 | HR 95 | Temp 97.2°F | Resp 16 | Ht 68.0 in | Wt 146.8 lb

## 2019-10-13 DIAGNOSIS — I10 Essential (primary) hypertension: Secondary | ICD-10-CM | POA: Diagnosis not present

## 2019-10-13 DIAGNOSIS — I739 Peripheral vascular disease, unspecified: Secondary | ICD-10-CM | POA: Diagnosis not present

## 2019-10-13 DIAGNOSIS — D649 Anemia, unspecified: Secondary | ICD-10-CM | POA: Diagnosis not present

## 2019-10-13 MED ORDER — AMLODIPINE BESYLATE 5 MG PO TABS: 5.0000 mg | ORAL_TABLET | Freq: Every day | ORAL | 0 refills | Status: DC

## 2019-10-13 NOTE — Progress Notes (Signed)
Name: Ryan [name]   MRN: [id number]    DOB: 04-23-1956   Date:10/13/2019       Progress Note  Subjective  Chief Complaint  Chief Complaint  Patient presents with  . Hospitalization Follow-up    HPI  [name] has a long history of PAD with claudication, he used to see Dr. Lucky Cowboy but asked for a second opinion . He was sent to Dr. [name] , ABI showed right 0.2 and left 1.01, sentes int he right lower extremity were all occluded and he had surgery done on 09/14/2019   1.Harvest of right greater saphenous vein 2.Extensive Right external iliac, common femoral and profunda femoral endarterectomy with vein patch angioplasty 3.Right common femoral to anterior tibial artery bypass with composite 58m ringed ptfe and reversed greater saphenous vein distally 4.Right common and external iliac artery stenting with 6 x 1259mElluvia  On discharge stated patient was supposed to take Plavix. Patient did not bring his medications with him. We contacted Dr. CaDonzetta Mattersnd nurse advised for him to take Eliquis and aspirin, not plavix  Patient still has some swelling on both lower legs, right more than left, he states pain has significantly improved since procedure and he is happy with results.   His bp is slightly elevated, advised him to return for bp check and make sure he brings all his medications with him to all office visits to avoid complications. He was anemic and we will recheck levels today   Patient Active Problem List   Diagnosis Date Noted  . Ischemia of right lower extremity 01/28/2019  . Iron deficiency anemia 11/23/2018  . Stage 3 chronic kidney disease 10/21/2018  . Atherosclerosis of native arteries of extremity with rest pain (HCGibsonburg04/21/2020  . PAD (peripheral artery disease) (HCSardis01/28/2020  . Ischemic leg 06/25/2018  . Claudication (HCGreat Neck Plaza09/24/2019  . B12 deficiency 03/12/2018  . Vitamin B1 deficiency 03/12/2018  . Varicose veins of leg with pain, right 03/12/2018  .  Enlarged thoracic aorta (HCLemoore09/08/2017  . Alcoholism /alcohol abuse (HCShawnee09/08/2017  . Paresthesia 02/24/2018  . Ectatic aorta (HCOak Island08/30/2019  . Emphysema lung (HCAllen Park08/30/2019  . Atherosclerosis of aorta (HCSpencer08/30/2019  . Encounter for tobacco use cessation counseling   . Benign neoplasm of cecum   . Benign neoplasm of transverse colon   . Benign neoplasm of sigmoid colon   . Hypertension, benign 08/31/2015  . Tobacco use 08/31/2015    Past Surgical History:  Procedure Laterality Date  . ABDOMINAL AORTOGRAM W/LOWER EXTREMITY N/A 09/13/2019   Procedure: ABDOMINAL AORTOGRAM W/LOWER EXTREMITY;  Surgeon: CaWaynetta SandyMD;  Location: [facility];  Service: Cardiovascular;  Laterality: N/A;  . COLONOSCOPY WITH PROPOFOL N/A 02/15/2016   Procedure: COLONOSCOPY WITH PROPOFOL;  Surgeon: DaLucilla LameMD;  Location: [facility] Service: Endoscopy;  Laterality: N/A;  . FEMORAL-POPLITEAL BYPASS GRAFT Right 09/14/2019   Procedure: BYPASS GRAFT FEMORAL-POPLITEAL ARTERY;  Surgeon: CaWaynetta SandyMD;  Location: [facility] Service: Vascular;  Laterality: Right;  . HERNIA REPAIR  1999   left inguinal  . INSERTION OF ILIAC STENT Right 09/14/2019   Procedure: Insertion Of Common and External Iliac Stent;  Surgeon: CaWaynetta SandyMD;  Location: [facility] Service: Vascular;  Laterality: Right;  . LOWER EXTREMITY ANGIOGRAPHY Right 05/07/2018   Procedure: LOWER EXTREMITY ANGIOGRAPHY;  Surgeon: DeAlgernon HuxleyMD;  Location: AR[facility] LAB;  Service: Cardiovascular;  Laterality: Right;  . LOWER EXTREMITY ANGIOGRAPHY  Right 06/25/2018   Procedure: LOWER EXTREMITY ANGIOGRAPHY;  Surgeon: Algernon Huxley, MD;  Location: [facility];  Service: Cardiovascular;  Laterality: Right;  . LOWER EXTREMITY ANGIOGRAPHY Right 07/01/2018   Procedure: LOWER EXTREMITY ANGIOGRAPHY;  Surgeon: Algernon Huxley, MD;  Location: [place] [facility];  Service: Cardiovascular;   Laterality: Right;  . LOWER EXTREMITY ANGIOGRAPHY Right 08/12/2018   Procedure: LOWER EXTREMITY ANGIOGRAPHY;  Surgeon: Algernon Huxley, MD;  Location: [place] [facility] LAB;  Service: Cardiovascular;  Laterality: Right;  . LOWER EXTREMITY ANGIOGRAPHY Right 09/03/2018   Procedure: LOWER EXTREMITY ANGIOGRAPHY;  Surgeon: Algernon Huxley, MD;  Location: [place] [facility];  Service: Cardiovascular;  Laterality: Right;  . LOWER EXTREMITY ANGIOGRAPHY Right 09/04/2018   Procedure: Lower Extremity Angiography;  Surgeon: Algernon Huxley, MD;  Location: [place] [facility];  Service: Cardiovascular;  Laterality: Right;  . LOWER EXTREMITY ANGIOGRAPHY Right 10/01/2018   Procedure: LOWER EXTREMITY ANGIOGRAPHY;  Surgeon: Algernon Huxley, MD;  Location: [place] [facility];  Service: Cardiovascular;  Laterality: Right;  . LOWER EXTREMITY ANGIOGRAPHY Right 10/01/2018   Procedure: Lower Extremity Angiography;  Surgeon: Algernon Huxley, MD;  Location: [place] [facility];  Service: Cardiovascular;  Laterality: Right;  . LOWER EXTREMITY ANGIOGRAPHY Right 10/15/2018   Procedure: LOWER EXTREMITY ANGIOGRAPHY;  Surgeon: Algernon Huxley, MD;  Location: [facility];  Service: Cardiovascular;  Laterality: Right;  . LOWER EXTREMITY ANGIOGRAPHY Left 10/16/2018   Procedure: Lower Extremity Angiography;  Surgeon: Algernon Huxley, MD;  Location: [place] LAB;  Service: Cardiovascular;  Laterality: Left;  . LOWER EXTREMITY ANGIOGRAPHY Left 01/20/2019   Procedure: LOWER EXTREMITY ANGIOGRAPHY;  Surgeon: Algernon Huxley, MD;  Location: [facility] [facility] LAB;  Service: Cardiovascular;  Laterality: Left;  . LOWER EXTREMITY ANGIOGRAPHY Right 01/21/2019   Procedure: Lower Extremity Angiography;  Surgeon: Algernon Huxley, MD;  Location: [place] [facility] LAB;  Service: Cardiovascular;  Laterality: Right;  . LOWER EXTREMITY ANGIOGRAPHY Right 01/28/2019   Procedure: LOWER EXTREMITY ANGIOGRAPHY;  Surgeon: Algernon Huxley, MD;  Location: [place] [facility];   Service: Cardiovascular;  Laterality: Right;  . LOWER EXTREMITY ANGIOGRAPHY Right 01/29/2019   Procedure: Lower Extremity Angiography;  Surgeon: Algernon Huxley, MD;  Location: [facility];  Service: Cardiovascular;  Laterality: Right;  . LOWER EXTREMITY INTERVENTION Right 07/02/2018   Procedure: LOWER EXTREMITY INTERVENTION;  Surgeon: Algernon Huxley, MD;  Location: [place] [facility];  Service: Cardiovascular;  Laterality: Right;  . POLYPECTOMY N/A 02/15/2016   Procedure: POLYPECTOMY;  Surgeon: Lucilla Lame, MD;  Location: [facility];  Service: Endoscopy;  Laterality: N/A;    Family History  Problem Relation Age of Onset  . COPD Mother   . Hypertension Father   . Heart attack Brother     Social History   Tobacco Use  . Smoking status: Former Smoker    Packs/day: 0.50    Years: 40.00    Pack years: 20.00    Types: Cigarettes    Start date: 07/21/1978    Quit date: 12/2018    Years since quitting: 0.8  . Smokeless tobacco: Never Used  Substance Use Topics  . Alcohol use: Yes    Alcohol/week: 12.0 standard drinks    Types: 12 Cans of beer per week     Current Outpatient Medications:  .  acetaminophen (TYLENOL) 500 MG tablet, Take 1 tablet (500 mg total) by mouth 2 (two) times daily. (Patient taking differently:  Take 500 mg by mouth every 6 (six) hours as needed for mild pain or moderate pain. ), Disp: 100 tablet, Rfl: 0 .  amLODipine (NORVASC) 2.5 MG tablet, Take 1 tablet (2.5 mg total) by mouth daily., Disp: 90 tablet, Rfl: 0 .  apixaban (ELIQUIS) 5 MG TABS tablet, Take 1 tablet (5 mg total) by mouth 2 (two) times daily., Disp: 180 tablet, Rfl: 3 .  aspirin EC 81 MG tablet, Take 1 tablet (81 mg total) by mouth daily., Disp: 90 tablet, Rfl: 3 .  atorvastatin (LIPITOR) 20 MG tablet, Take 1 tablet by mouth once daily (Patient taking differently: Take 20 mg by mouth daily. ), Disp: 90 tablet, Rfl: 0 .  clopidogrel (PLAVIX) 75 MG tablet, Take 1 tablet (75 mg total) by mouth  daily., Disp: 30 tablet, Rfl: 2 .  Cyanocobalamin (VITAMIN B-12) 1000 MCG SUBL, Place 1 tablet (1,000 mcg total) under the tongue daily. (Patient taking differently: Place 500 mcg under the tongue daily. ), Disp: 90 tablet, Rfl: 0 .  Fluticasone-Umeclidin-Vilant (TRELEGY ELLIPTA) 100-62.5-25 MCG/INH AEPB, Inhale 1 puff into the lungs daily. (Patient taking differently: Inhale 1 puff into the lungs daily as needed (shortness of breath). ), Disp: 60 each, Rfl: 2 .  thiamine (VITAMIN B-1) 50 MG tablet, Take 1 tablet (50 mg total) by mouth daily., Disp: 30 tablet, Rfl: 2  No Known Allergies  I personally reviewed active problem list, medication list, allergies, family history, social history, health maintenance with the patient/caregiver today.   ROS  Constitutional: Negative for fever or weight change.  Respiratory: Negative for cough , positive for shortness of breath.   Cardiovascular: Negative for chest pain or palpitations.  Gastrointestinal: Negative for abdominal pain, no bowel changes.  Musculoskeletal: positive  for gait problem but no  joint swelling.  Skin: Negative for rash.  Neurological: Negative for dizziness or headache.  No other specific complaints in a complete review of systems (except as listed in HPI above).  Objective  Vitals:   10/13/19 1423  BP: (!) 144/92  Pulse: 95  Resp: 16  Temp: (!) 97.2 F (36.2 C)  TempSrc: Temporal  SpO2: 97%  Weight: 146 lb 12.8 oz (66.6 kg)  Height: 5' 8"  (1.727 m)    Body mass index is 22.32 kg/m.  Physical Exam  Constitutional: Patient appears well-developed and well-nourished.  No distress.  HEENT: head atraumatic, normocephalic, pupils equal and reactive to light Cardiovascular: Normal rate, regular rhythm and normal heart sounds.  No murmur heard. No BLE edema. Pulmonary/Chest: Effort normal and breath sounds normal. No respiratory distress. Abdominal: Soft.  There is no tenderness. Psychiatric: Patient has a normal  mood and affect. behavior is normal. Judgment and thought content normal.   Recent Results (from the past 2160 hour(s))  CBC     Status: Abnormal   Collection Time: 07/18/19  8:45 PM  Result Value Ref Range   WBC 4.8 4.0 - 10.5 K/uL   RBC 3.89 (L) 4.22 - 5.81 MIL/uL   Hemoglobin 11.0 (L) 13.0 - 17.0 g/dL   HCT 33.0 (L) 39.0 - 52.0 %   MCV 84.8 80.0 - 100.0 fL   MCH 28.3 26.0 - 34.0 pg   MCHC 33.3 30.0 - 36.0 g/dL   RDW 14.4 11.5 - 15.5 %   Platelets 219 150 - 400 K/uL   nRBC 0.0 0.0 - 0.2 %    Comment: Performed at [facility], [address] [place], [place] [address]  Comprehensive metabolic panel  Status: Abnormal   Collection Time: 07/18/19  8:45 PM  Result Value Ref Range   Sodium 133 (L) 135 - 145 mmol/L   Potassium 4.1 3.5 - 5.1 mmol/L   Chloride 102 98 - 111 mmol/L   CO2 22 22 - 32 mmol/L   Glucose, Bld 112 (H) 70 - 99 mg/dL   BUN 25 (H) 8 - 23 mg/dL   Creatinine, Ser 1.65 (H) 0.61 - 1.24 mg/dL   Calcium 9.0 8.9 - 10.3 mg/dL   Total Protein 7.8 6.5 - 8.1 g/dL   Albumin 4.2 3.5 - 5.0 g/dL   AST 26 15 - 41 U/L   ALT 23 0 - 44 U/L   Alkaline Phosphatase 64 38 - 126 U/L   Total Bilirubin 0.7 0.3 - 1.2 mg/dL   GFR calc non Af Amer 44 (L) >60 mL/min   GFR calc Af Amer 51 (L) >60 mL/min   Anion gap 9 5 - 15    Comment: Performed at [facility], [address] [place], [place] [address]  Lipase, blood     Status: None   Collection Time: 07/18/19  8:45 PM  Result Value Ref Range   Lipase 33 11 - 51 U/L    Comment: Performed at [facility], [place], [place] [address]  Troponin I (High Sensitivity)     Status: None   Collection Time: 07/18/19  8:45 PM  Result Value Ref Range   Troponin I (High Sensitivity) 4 <18 ng/L    Comment: (NOTE) Elevated high sensitivity troponin I (hsTnI) values and significant  changes across serial measurements may suggest ACS but many other  chronic and acute conditions are known to  elevate hsTnI results.  Refer to the "Links" section for chest pain algorithms and additional  guidance. Performed at [facility], [address] [place], [place] [address]   POC SARS Coronavirus 2 Ag-ED - Nasal Swab (BD Veritor Kit)     Status: None   Collection Time: 07/18/19 10:23 PM  Result Value Ref Range   SARS Coronavirus 2 Ag Negative Negative  SARS CORONAVIRUS 2 (TAT 6-24 HRS) Nasopharyngeal Nasopharyngeal Swab     Status: Abnormal   Collection Time: 07/18/19 11:04 PM   Specimen: Nasopharyngeal Swab  Result Value Ref Range   SARS Coronavirus 2 POSITIVE (A) NEGATIVE    Comment: RESULT CALLED TO, READ BACK BY AND VERIFIED WITH: Karma Greaser RN 12:10 07/19/19 ([email or web address]) (NOTE) SARS-CoV-2 target nucleic acids are DETECTED. The SARS-CoV-2 RNA is generally detectable in upper and lower respiratory specimens during the acute phase of infection. Positive results are indicative of the presence of SARS-CoV-2 RNA. Clinical correlation with patient history and other diagnostic information is  necessary to determine patient infection status. Positive results do not rule out bacterial infection or co-infection with other viruses.  The expected result is Negative. Fact Sheet for Patients: SugarRoll.be Fact Sheet for Healthcare Providers: https://www.woods-mathews.com/ This test is not yet approved or cleared by the [place] FDA and  has been authorized for detection and/or diagnosis of SARS-CoV-2 by FDA under an Emergency Use Authorization (EUA). This EUA will remain  in effect (meaning this test can be used) for the  duration of the COVID-19 declaration under Section 564(b)(1) of the Act, 21 U.S.C. section 360bbb-3(b)(1), unless the authorization is terminated or revoked sooner. Performed at [facility], [place] [address], [place], [place] [address]   POC SARS Coronavirus 2 Ag     Status:  None   Collection Time: 07/18/19 11:09  PM  Result Value Ref Range   SARS Coronavirus 2 Ag NEGATIVE NEGATIVE    Comment: (NOTE) SARS-CoV-2 antigen NOT DETECTED.  Negative results are presumptive.  Negative results do not preclude SARS-CoV-2 infection and should not be used as the sole basis for treatment or other patient management decisions, including infection  control decisions, particularly in the presence of clinical signs and  symptoms consistent with COVID-19, or in those who have been in contact with the virus.  Negative results must be combined with clinical observations, patient history, and epidemiological information. The expected result is Negative. Fact Sheet for Patients: PodPark.tn Fact Sheet for Healthcare Providers: GiftContent.is This test is not yet approved or cleared by the [place] FDA and  has been authorized for detection and/or diagnosis of SARS-CoV-2 by FDA under an Emergency Use Authorization (EUA).  This EUA will remain in effect (meaning this test can be used) for the duration of  the COVID-19 de claration under Section 564(b)(1) of the Act, 21 U.S.C. section 360bbb-3(b)(1), unless the authorization is terminated or revoked sooner.   SARS CORONAVIRUS 2 (TAT 6-24 HRS) Nasopharyngeal Nasopharyngeal Swab     Status: None   Collection Time: 09/10/19  3:22 PM   Specimen: Nasopharyngeal Swab  Result Value Ref Range   SARS Coronavirus 2 NEGATIVE NEGATIVE    Comment: (NOTE) SARS-CoV-2 target nucleic acids are NOT DETECTED. The SARS-CoV-2 RNA is generally detectable in upper and lower respiratory specimens during the acute phase of infection. Negative results do not preclude SARS-CoV-2 infection, do not rule out co-infections with other pathogens, and should not be used as the sole basis for treatment or other patient management decisions. Negative results must be combined with clinical observations, patient history, and epidemiological  information. The expected result is Negative. Fact Sheet for Patients: SugarRoll.be Fact Sheet for Healthcare Providers: https://www.woods-mathews.com/ This test is not yet approved or cleared by the [place] FDA and  has been authorized for detection and/or diagnosis of SARS-CoV-2 by FDA under an Emergency Use Authorization (EUA). This EUA will remain  in effect (meaning this test can be used) for the duration of the COVID-19 declaration under Section 56 4(b)(1) of the Act, 21 U.S.C. section 360bbb-3(b)(1), unless the authorization is terminated or revoked sooner. Performed at [facility], [place] [address], [place], [place] [address]   I-STAT, D[place] Clap 8     Status: Abnormal   Collection Time: 09/13/19 10:44 AM  Result Value Ref Range   Sodium 139 135 - 145 mmol/L   Potassium 4.2 3.5 - 5.1 mmol/L   Chloride 110 98 - 111 mmol/L   BUN 24 (H) 8 - 23 mg/dL   Creatinine, Ser 1.80 (H) 0.61 - 1.24 mg/dL   Glucose, Bld 82 70 - 99 mg/dL    Comment: Glucose reference range applies only to samples taken after fasting for at least 8 hours.   Calcium, Ion 1.22 1.15 - 1.40 mmol/L   TCO2 25 22 - 32 mmol/L   Hemoglobin 11.9 (L) 13.0 - 17.0 g/dL   HCT 35.0 (L) 39.0 - 52.0 %  CBC     Status: Abnormal   Collection Time: 09/13/19  6:01 PM  Result Value Ref Range   WBC 4.9 4.0 - 10.5 K/uL   RBC 3.41 (L) 4.22 - 5.81 MIL/uL   Hemoglobin 9.8 (L) 13.0 - 17.0 g/dL   HCT 30.4 (L) 39.0 - 52.0 %   MCV 89.1 80.0 - 100.0  fL   MCH 28.7 26.0 - 34.0 pg   MCHC 32.2 30.0 - 36.0 g/dL   RDW 14.7 11.5 - 15.5 %   Platelets 238 150 - 400 K/uL   nRBC 0.0 0.0 - 0.2 %    Comment: Performed at [facility], [place] [address] [place], [place] [address]  Creatinine, serum     Status: Abnormal   Collection Time: 09/13/19  6:01 PM  Result Value Ref Range   Creatinine, Ser 1.95 (H) 0.61 - 1.24 mg/dL   GFR calc non Af Amer 36 (L) >60 mL/min   GFR calc Af Amer 42 (L)  >60 mL/min    Comment: Performed at [facility] [address], [place], [place] [address]  APTT     Status: None   Collection Time: 09/13/19 10:44 PM  Result Value Ref Range   aPTT 32 24 - 36 seconds    Comment: Performed at [facility] [address], [place], [place] [address]  Comprehensive metabolic panel     Status: Abnormal   Collection Time: 09/13/19 10:44 PM  Result Value Ref Range   Sodium 136 135 - 145 mmol/L   Potassium 4.5 3.5 - 5.1 mmol/L   Chloride 108 98 - 111 mmol/L   CO2 20 (L) 22 - 32 mmol/L   Glucose, Bld 143 (H) 70 - 99 mg/dL    Comment: Glucose reference range applies only to samples taken after fasting for at least 8 hours.   BUN 25 (H) 8 - 23 mg/dL   Creatinine, Ser 2.30 (H) 0.61 - 1.24 mg/dL   Calcium 8.1 (L) 8.9 - 10.3 mg/dL   Total Protein 6.4 (L) 6.5 - 8.1 g/dL   Albumin 3.4 (L) 3.5 - 5.0 g/dL   AST 23 15 - 41 U/L   ALT 24 0 - 44 U/L   Alkaline Phosphatase 55 38 - 126 U/L   Total Bilirubin 0.3 0.3 - 1.2 mg/dL   GFR calc non Af Amer 29 (L) >60 mL/min   GFR calc Af Amer 34 (L) >60 mL/min   Anion gap 8 5 - 15    Comment: Performed at [facility] [address] [place], [place] [address]  Protime-INR     Status: None   Collection Time: 09/13/19 10:44 PM  Result Value Ref Range   Prothrombin Time 14.6 11.4 - 15.2 seconds   INR 1.2 0.8 - 1.2    Comment: (NOTE) INR goal varies based on device and disease states. Performed at [facility], [place] [address], [place], [place] [address]   Type and screen     Status: None   Collection Time: 09/13/19 10:45 PM  Result Value Ref Range   ABO/RH(D) A POS    Antibody Screen NEG    Sample Expiration 09/16/2019,2359    Unit Number [id number]    Blood Component Type RED CELLS,LR    Unit division 00    Status of Unit ISSUED,FINAL    Transfusion Status OK TO TRANSFUSE    Crossmatch Result      Compatible Performed at [facility], [place] [address], [place], [place]  [address]    Unit Number [id number]    Blood Component Type RED CELLS,LR    Unit division 00    Status of Unit ISSUED,FINAL    Transfusion Status OK TO TRANSFUSE    Crossmatch Result Compatible   ABO/Rh     Status: None  Collection Time: 09/13/19 10:45 PM  Result Value Ref Range   ABO/RH(D)      A POS Performed at [facility] [address], [place], [place] [address]   BPAM RBC     Status: None   Collection Time: 09/13/19 10:45 PM  Result Value Ref Range   ISSUE DATE / TIME 809983382505    Blood Product Unit Number [id number]    PRODUCT CODE [id number]    Unit Type and Rh 6200    Blood Product Expiration Date 240973532992    ISSUE DATE / TIME 426834196222    Blood Product Unit Number [id number]    PRODUCT CODE [id number]    Unit Type and Rh 6200    Blood Product Expiration Date 563149702637   Surgical PCR screen     Status: None   Collection Time: 09/14/19 12:17 AM   Specimen: Nasal Mucosa; Nasal Swab  Result Value Ref Range   MRSA, PCR NEGATIVE NEGATIVE   Staphylococcus aureus NEGATIVE NEGATIVE    Comment: (NOTE) The Xpert SA Assay (FDA approved for NASAL specimens in patients 77 years of age and older), is one component of a comprehensive surveillance program. It is not intended to diagnose infection nor to guide or monitor treatment. Performed at [facility], [place] [address] [place], [place] [address]   Urinalysis, Routine w reflex microscopic     Status: Abnormal   Collection Time: 09/14/19  2:19 AM  Result Value Ref Range   Color, Urine YELLOW YELLOW   APPearance CLEAR CLEAR   Specific Gravity, Urine 1.026 1.005 - 1.030   pH 5.0 5.0 - 8.0   Glucose, UA NEGATIVE NEGATIVE mg/dL   Hgb urine dipstick SMALL (A) NEGATIVE   Bilirubin Urine NEGATIVE NEGATIVE   Ketones, ur NEGATIVE NEGATIVE mg/dL   Protein, ur 30 (A) NEGATIVE mg/dL   Nitrite NEGATIVE NEGATIVE   Leukocytes,Ua NEGATIVE NEGATIVE   RBC / HPF 0-5 0 - 5 RBC/hpf   Bacteria, UA NONE SEEN  NONE SEEN   Mucus PRESENT     Comment: Performed at [facility], [place] [address], [place], [place] [address]  I-STAT, D[place] Clap 8     Status: Abnormal   Collection Time: 09/14/19  7:54 AM  Result Value Ref Range   Sodium 138 135 - 145 mmol/L   Potassium 4.4 3.5 - 5.1 mmol/L   Chloride 110 98 - 111 mmol/L   BUN 27 (H) 8 - 23 mg/dL   Creatinine, Ser 2.60 (H) 0.61 - 1.24 mg/dL   Glucose, Bld 91 70 - 99 mg/dL    Comment: Glucose reference range applies only to samples taken after fasting for at least 8 hours.   Calcium, Ion 1.19 1.15 - 1.40 mmol/L   TCO2 22 22 - 32 mmol/L   Hemoglobin 9.2 (L) 13.0 - 17.0 g/dL   HCT 27.0 (L) 39.0 - 52.0 %  Creatinine, serum     Status: Abnormal   Collection Time: 09/14/19  4:03 PM  Result Value Ref Range   Creatinine, Ser 2.35 (H) 0.61 - 1.24 mg/dL   GFR calc non Af Amer 29 (L) >60 mL/min   GFR calc Af Amer 33 (L) >60 mL/min    Comment: Performed at [facility] [address], [place],  [address]  Basic metabolic panel     Status: Abnormal   Collection Time: 09/15/19  3:02 AM  Result Value Ref Range   Sodium 141 135 - 145 mmol/L  Potassium 4.9 3.5 - 5.1 mmol/L   Chloride 113 (H) 98 - 111 mmol/L   CO2 17 (L) 22 - 32 mmol/L   Glucose, Bld 128 (H) 70 - 99 mg/dL    Comment: Glucose reference range applies only to samples taken after fasting for at least 8 hours.   BUN 27 (H) 8 - 23 mg/dL   Creatinine, Ser 2.74 (H) 0.61 - 1.24 mg/dL   Calcium 7.1 (L) 8.9 - 10.3 mg/dL   GFR calc non Af Amer 24 (L) >60 mL/min   GFR calc Af Amer 28 (L) >60 mL/min   Anion gap 11 5 - 15    Comment: Performed at [facility] [address] [place], [place] [address]  CBC     Status: Abnormal   Collection Time: 09/15/19  5:26 AM  Result Value Ref Range   WBC 4.7 4.0 - 10.5 K/uL   RBC 2.11 (L) 4.22 - 5.81 MIL/uL   Hemoglobin 6.1 (LL) 13.0 - 17.0 g/dL    Comment: REPEATED TO VERIFY THIS CRITICAL RESULT HAS VERIFIED AND BEEN CALLED TO C. DAVIDSON,  RN BY JULIE MACEDA DEL ANGEL ON 03 24 2021 AT 6433, AND HAS BEEN READ BACK.     HCT 19.6 (L) 39.0 - 52.0 %   MCV 92.9 80.0 - 100.0 fL   MCH 28.9 26.0 - 34.0 pg   MCHC 31.1 30.0 - 36.0 g/dL   RDW 15.2 11.5 - 15.5 %   Platelets 200 150 - 400 K/uL    Comment: REPEATED TO VERIFY   nRBC 0.0 0.0 - 0.2 %    Comment: Performed at [facility], [place] [address] [place], [place] [address]  Prepare RBC (crossmatch)     Status: None   Collection Time: 09/15/19  6:19 AM  Result Value Ref Range   Order Confirmation      ORDER PROCESSED BY BLOOD BANK Performed at [facility], [place] [address], [place], [place] [address]   Hemoglobin and hematocrit, blood     Status: Abnormal   Collection Time: 09/15/19  4:57 PM  Result Value Ref Range   Hemoglobin 9.4 (L) 13.0 - 17.0 g/dL    Comment: REPEATED TO VERIFY POST TRANSFUSION SPECIMEN    HCT 29.9 (L) 39.0 - 52.0 %    Comment: Performed at [facility] [address], [place], [place] [address]  Basic metabolic panel     Status: Abnormal   Collection Time: 09/16/19  3:41 AM  Result Value Ref Range   Sodium 138 135 - 145 mmol/L   Potassium 5.0 3.5 - 5.1 mmol/L   Chloride 112 (H) 98 - 111 mmol/L   CO2 17 (L) 22 - 32 mmol/L   Glucose, Bld 109 (H) 70 - 99 mg/dL    Comment: Glucose reference range applies only to samples taken after fasting for at least 8 hours.   BUN 33 (H) 8 - 23 mg/dL   Creatinine, Ser 4.00 (H) 0.61 - 1.24 mg/dL    Comment: DELTA CHECK NOTED   Calcium 7.2 (L) 8.9 - 10.3 mg/dL   GFR calc non Af Amer 15 (L) >60 mL/min   GFR calc Af Amer 17 (L) >60 mL/min   Anion gap 9 5 - 15    Comment: Performed at [facility] [address], [place], [place] [address]  CBC     Status: Abnormal   Collection Time: 09/16/19  3:41 AM  Result Value  Ref Range   WBC 5.9 4.0 - 10.5 K/uL   RBC 3.07 (L) 4.22 - 5.81 MIL/uL   Hemoglobin 8.2 (L) 13.0 - 17.0 g/dL    Comment: REPEATED TO VERIFY   HCT 26.3 (L) 39.0 - 52.0 %   MCV  85.7 80.0 - 100.0 fL    Comment: REPEATED TO VERIFY   MCH 26.7 26.0 - 34.0 pg   MCHC 31.2 30.0 - 36.0 g/dL   RDW 20.2 (H) 11.5 - 15.5 %   Platelets 178 150 - 400 K/uL    Comment: REPEATED TO VERIFY   nRBC 0.3 (H) 0.0 - 0.2 %    Comment: Performed at [facility] [address], [place], [place] [address]  Renal function panel     Status: Abnormal   Collection Time: 09/17/19  9:26 AM  Result Value Ref Range   Sodium 138 135 - 145 mmol/L   Potassium 4.1 3.5 - 5.1 mmol/L   Chloride 113 (H) 98 - 111 mmol/L   CO2 18 (L) 22 - 32 mmol/L   Glucose, Bld 192 (H) 70 - 99 mg/dL    Comment: Glucose reference range applies only to samples taken after fasting for at least 8 hours.   BUN 22 8 - 23 mg/dL   Creatinine, Ser 2.47 (H) 0.61 - 1.24 mg/dL    Comment: DELTA CHECK NOTED   Calcium 7.6 (L) 8.9 - 10.3 mg/dL   Phosphorus 2.2 (L) 2.5 - 4.6 mg/dL   Albumin 2.7 (L) 3.5 - 5.0 g/dL   GFR calc non Af Amer 27 (L) >60 mL/min   GFR calc Af Amer 31 (L) >60 mL/min   Anion gap 7 5 - 15    Comment: Performed at [facility] [address], [place], [place] [address]  CBC     Status: Abnormal   Collection Time: 09/17/19  9:26 AM  Result Value Ref Range   WBC 5.5 4.0 - 10.5 K/uL   RBC 2.67 (L) 4.22 - 5.81 MIL/uL   Hemoglobin 7.4 (L) 13.0 - 17.0 g/dL   HCT 23.1 (L) 39.0 - 52.0 %   MCV 86.5 80.0 - 100.0 fL   MCH 27.7 26.0 - 34.0 pg   MCHC 32.0 30.0 - 36.0 g/dL   RDW 19.2 (H) 11.5 - 15.5 %   Platelets 168 150 - 400 K/uL   nRBC 0.0 0.0 - 0.2 %    Comment: Performed at [facility], [place] [address] [place], [place] [address]  Renal function panel     Status: Abnormal   Collection Time: 09/18/19  3:36 AM  Result Value Ref Range   Sodium 139 135 - 145 mmol/L   Potassium 4.6 3.5 - 5.1 mmol/L   Chloride 111 98 - 111 mmol/L   CO2 19 (L) 22 - 32 mmol/L   Glucose, Bld 100 (H) 70 - 99 mg/dL    Comment: Glucose reference range applies only to samples taken after fasting for at least 8  hours.   BUN 18 8 - 23 mg/dL   Creatinine, Ser 2.15 (H) 0.61 - 1.24 mg/dL   Calcium 8.0 (L) 8.9 - 10.3 mg/dL   Phosphorus 2.1 (L) 2.5 - 4.6 mg/dL   Albumin 2.6 (L) 3.5 - 5.0 g/dL   GFR calc non Af Amer 32 (L) >60 mL/min   GFR calc Af Amer 37 (L) >60 mL/min   Anion gap 9 5 - 15    Comment: Performed at [facility], [place] [address] [place], [place] [address]  Renal function panel     Status: Abnormal   Collection Time: 09/19/19  4:04 AM  Result Value Ref Range   Sodium 137 135 - 145 mmol/L   Potassium 4.3 3.5 - 5.1 mmol/L   Chloride 108 98 - 111 mmol/L   CO2 21 (L) 22 - 32 mmol/L   Glucose, Bld 136 (H) 70 - 99 mg/dL    Comment: Glucose reference range applies only to samples taken after fasting for at least 8 hours.   BUN 16 8 - 23 mg/dL   Creatinine, Ser 1.76 (H) 0.61 - 1.24 mg/dL   Calcium 8.4 (L) 8.9 - 10.3 mg/dL   Phosphorus 2.3 (L) 2.5 - 4.6 mg/dL   Albumin 2.7 (L) 3.5 - 5.0 g/dL   GFR calc non Af Amer 41 (L) >60 mL/min   GFR calc Af Amer 47 (L) >60 mL/min   Anion gap 8 5 - 15    Comment: Performed at [facility] [address], [place], [place] [address]  CBC     Status: Abnormal   Collection Time: 09/19/19  9:04 AM  Result Value Ref Range   WBC 5.9 4.0 - 10.5 K/uL   RBC 3.09 (L) 4.22 - 5.81 MIL/uL   Hemoglobin 8.2 (L) 13.0 - 17.0 g/dL   HCT 26.5 (L) 39.0 - 52.0 %   MCV 85.8 80.0 - 100.0 fL   MCH 26.5 26.0 - 34.0 pg   MCHC 30.9 30.0 - 36.0 g/dL   RDW 17.6 (H) 11.5 - 15.5 %   Platelets 250 150 - 400 K/uL   nRBC 0.0 0.0 - 0.2 %    Comment: Performed at [facility], [place] [address], [place], [place] [address]      PHQ2/9: Depression screen [facility] 2/9 10/13/2019 06/29/2019 04/09/2019 02/10/2019 11/20/2018  Decreased Interest 0 0 0 0 0  Down, Depressed, Hopeless 0 0 0 0 0  PHQ - 2 Score 0 0 0 0 0  Altered sleeping 0 0 0 0 0  Tired, decreased energy 0 0 0 0 0  Change in appetite 0 0 0 0 0  Feeling bad or failure about yourself  0 0 0 0 0  Trouble  concentrating 0 0 0 0 0  Moving slowly or fidgety/restless 0 0 0 0 0  Suicidal thoughts 0 0 0 0 0  PHQ-9 Score 0 0 0 0 0  Difficult doing work/chores - Not difficult at all - Not difficult at all -  Some recent data might be hidden    phq 9 is negative   Fall Risk: Fall Risk  10/13/2019 06/29/2019 02/10/2019 11/20/2018 10/21/2018  Falls in the past year? 0 0 0 0 0  Number falls in past yr: 0 0 0 0 0  Injury with Fall? 0 0 0 0 0     Functional Status Survey: Is the patient deaf or have difficulty hearing?: No Does the patient have difficulty seeing, even when wearing glasses/contacts?: No Does the patient have difficulty concentrating, remembering, or making decisions?: No Does the patient have difficulty walking or climbing stairs?: No Does the patient have difficulty dressing or bathing?: No Does the patient have difficulty doing errands alone such as visiting a doctor's office or shopping?: No   Assessment & Plan  1. Hypertension, benign  - amLODipine (NORVASC) 5 MG tablet; Take 1 tablet (5 mg total) by mouth daily.  Dispense: 90 tablet; Refill: 0 -  COMPLETE METABOLIC PANEL WITH GFR  2. PAD (peripheral artery disease) (HCC)  Recent bypass done in [place], still has some swelling on right lower leg, but per patient pain has improved   3. Anemia, unspecified type  - CBC with Differential/Platelet - Iron, TIBC and Ferritin Panel

## 2019-10-14 ENCOUNTER — Telehealth: Payer: Self-pay

## 2019-10-14 LAB — CBC WITH DIFFERENTIAL/PLATELET
Absolute Monocytes: 532 cells/uL (ref 200–950)
Basophils Absolute: 49 cells/uL (ref 0–200)
Basophils Relative: 0.7 %
Eosinophils Absolute: 119 cells/uL (ref 15–500)
Eosinophils Relative: 1.7 %
HCT: 35.4 % — ABNORMAL LOW (ref 38.5–50.0)
Hemoglobin: 11.5 g/dL — ABNORMAL LOW (ref 13.2–17.1)
Lymphs Abs: 1519 cells/uL (ref 850–3900)
MCH: 28.1 pg (ref 27.0–33.0)
MCHC: 32.5 g/dL (ref 32.0–36.0)
MCV: 86.6 fL (ref 80.0–100.0)
MPV: 10.2 fL (ref 7.5–12.5)
Monocytes Relative: 7.6 %
Neutro Abs: 4781 cells/uL (ref 1500–7800)
Neutrophils Relative %: 68.3 %
Platelets: 304 10*3/uL (ref 140–400)
RBC: 4.09 10*6/uL — ABNORMAL LOW (ref 4.20–5.80)
RDW: 15.7 % — ABNORMAL HIGH (ref 11.0–15.0)
Total Lymphocyte: 21.7 %
WBC: 7 10*3/uL (ref 3.8–10.8)

## 2019-10-14 LAB — COMPLETE METABOLIC PANEL WITH GFR
AG Ratio: 1.3 (calc) (ref 1.0–2.5)
ALT: 25 U/L (ref 9–46)
AST: 28 U/L (ref 10–35)
Albumin: 4.4 g/dL (ref 3.6–5.1)
Alkaline phosphatase (APISO): 96 U/L (ref 35–144)
BUN/Creatinine Ratio: 11 (calc) (ref 6–22)
BUN: 18 mg/dL (ref 7–25)
CO2: 23 mmol/L (ref 20–32)
Calcium: 9.2 mg/dL (ref 8.6–10.3)
Chloride: 104 mmol/L (ref 98–110)
Creat: 1.61 mg/dL — ABNORMAL HIGH (ref 0.70–1.25)
GFR, Est African American: 52 mL/min/{1.73_m2} — ABNORMAL LOW (ref >=60)
GFR, Est Non African American: 45 mL/min/{1.73_m2} — ABNORMAL LOW (ref >=60)
Globulin: 3.5 g/dL (calc) (ref 1.9–3.7)
Glucose, Bld: 88 mg/dL (ref 65–99)
Potassium: 4.6 mmol/L (ref 3.5–5.3)
Sodium: 135 mmol/L (ref 135–146)
Total Bilirubin: 0.4 mg/dL (ref 0.2–1.2)
Total Protein: 7.9 g/dL (ref 6.1–8.1)

## 2019-10-14 LAB — IRON,TIBC AND FERRITIN PANEL
%SAT: 16 % (calc) — ABNORMAL LOW (ref 20–48)
Ferritin: 143 ng/mL (ref 24–380)
Iron: 52 ug/dL (ref 50–180)
TIBC: 317 mcg/dL (calc) (ref 250–425)

## 2019-10-14 NOTE — Telephone Encounter (Signed)
Ashley from [facility] called and reported that he is taking Eliquis and Aspirin.

## 2019-10-14 NOTE — Telephone Encounter (Signed)
Copied from [place] [id number]. Topic: General - Call Back - No Documentation >> Oct 13, 2019  3:52 PM Erick Blinks wrote: Best contact [phone] [facility]  Call back request   Called and left 2nd message with Caryl Pina about clarity needed on Eliquis or Plavix or Aspirin. Left message on VM for Caryl Pina to rtc.

## 2019-10-14 NOTE — Telephone Encounter (Signed)
Patient called.  Patient aware.  

## 2019-10-26 ENCOUNTER — Ambulatory Visit: Payer: BLUE CROSS/BLUE SHIELD | Attending: Internal Medicine

## 2019-10-26 DIAGNOSIS — Z23 Encounter for immunization: Secondary | ICD-10-CM

## 2019-10-26 NOTE — Progress Notes (Signed)
   Covid-19 Vaccination Clinic  Name:  Ryan [name]    MRN: [id number] DOB: 11/08/1955  10/26/2019  Mr. Ryan [name] was observed post Covid-19 immunization for 15 minutes without incident. He was provided with Vaccine Information Sheet and instruction to access the V-Safe system.   Mr. Ryan [name] was instructed to call 911 with any severe reactions post vaccine: Marland Kitchen Difficulty breathing  . Swelling of face and throat  . A fast heartbeat  . A bad rash all over body  . Dizziness and weakness   Immunizations Administered    Name Date Dose VIS Date Route   Pfizer COVID-19 Vaccine 10/26/2019  3:01 PM 0.3 mL 08/18/2018 Intramuscular   Manufacturer: [place]   Lot: [id number]   [place]: [id number]

## 2019-11-12 ENCOUNTER — Ambulatory Visit (INDEPENDENT_AMBULATORY_CARE_PROVIDER_SITE_OTHER): Payer: BLUE CROSS/BLUE SHIELD | Admitting: Family Medicine

## 2019-11-12 ENCOUNTER — Encounter: Payer: Self-pay | Admitting: Family Medicine

## 2019-11-12 ENCOUNTER — Other Ambulatory Visit: Payer: Self-pay

## 2019-11-12 ENCOUNTER — Telehealth (HOSPITAL_COMMUNITY): Payer: Self-pay

## 2019-11-12 DIAGNOSIS — I739 Peripheral vascular disease, unspecified: Secondary | ICD-10-CM | POA: Diagnosis not present

## 2019-11-12 DIAGNOSIS — Z9889 Other specified postprocedural states: Secondary | ICD-10-CM

## 2019-11-12 DIAGNOSIS — I700 Atherosclerosis of aorta: Secondary | ICD-10-CM

## 2019-11-12 DIAGNOSIS — J449 Chronic obstructive pulmonary disease, unspecified: Secondary | ICD-10-CM | POA: Diagnosis not present

## 2019-11-12 DIAGNOSIS — I10 Essential (primary) hypertension: Secondary | ICD-10-CM

## 2019-11-12 MED ORDER — ATORVASTATIN CALCIUM 20 MG PO TABS: 20.0000 mg | ORAL_TABLET | Freq: Every day | ORAL | 1 refills | Status: DC

## 2019-11-12 MED ORDER — CLOPIDOGREL BISULFATE 75 MG PO TABS: 75.0000 mg | ORAL_TABLET | Freq: Every day | ORAL | 1 refills | Status: AC

## 2019-11-12 MED ORDER — TRELEGY ELLIPTA 100-62.5-25 MCG/INH IN AEPB: 1.0000 | INHALATION_SPRAY | Freq: Every day | RESPIRATORY_TRACT | 5 refills | Status: DC | PRN

## 2019-11-12 NOTE — Telephone Encounter (Signed)

## 2019-11-12 NOTE — Progress Notes (Signed)
Name: Ryan [name]   MRN: [id number]    DOB: 10-20-1955   Date:11/12/2019       Progress Note  Subjective  Chief Complaint  Chief Complaint  Patient presents with  . Medication Refill    eliquis  . Wheezing    I connected with  [name] on 11/12/19 at 10:40 AM EDT by telephone and verified that I am speaking with the correct person using two identifiers.  I discussed the limitations, risks, security and privacy concerns of performing an evaluation and management service by telephone and the availability of in person appointments. Staff also discussed with the patient that there may be a patient responsible charge related to this service. Patient Location: at home  Provider Location: [facility]   HPI  HTN: bp not checked, virtual visit and no vital signs , he has been compliant with medication now. No chest pain or palpitation   Atherosclerosis of Aorta: he is taking aspirin, stop Eliquis and resume Plavix and aspirin   PAD:  Ryan [name] has a long history of PAD with claudication, he used to see Dr. Lucky Cowboy but asked for a second opinion . He was sent to Dr. [name] , ABI showed right 0.2 and left 1.01, stents  he right lower extremity were all occluded and he had surgery done on 09/14/2019   1.Harvest of right greater saphenous vein 2.Extensive Right external iliac, common femoral and profunda femoral endarterectomy with vein patch angioplasty 3.Right common femoral to anterior tibial artery bypass with composite 62mm ringed ptfe and reversed greater saphenous vein distally 4.Right common and external iliac artery stenting with 6 x 169mm Elluvia  On discharge stated patient was supposed to take Plavix. Patient did not bring his medications with him during April visit, today he asked for refill of Eliquis. We contacted Dr. [name] again today through my chart messages and he recommended to go back on Plavix and aspirin , we will stop Eliquis today , he does not have any more  refills, they were given to him by previous vascular surgeon. He states pain is under control since procedure sill has some right ankle swelling and has follow up with Dr. [name] next week   Alcoholism: he is still drinking beer most days but not as much, usually 2 during week day and 4 on weekends, he states not taking B1 just vitamin B12, advised to resume B1 a few times a week.   Emphysema: he has been out of Trelegy for the past month, over the past week he has noticed wheezing, he has occasional cough that is dry, some sob with activity that is stable, advised to resume Trelegy and use it daily. He quit smoking 12/2018    Patient Active Problem List   Diagnosis Date Noted  . Ischemia of right lower extremity 01/28/2019  . Iron deficiency anemia 11/23/2018  . Stage 3 chronic kidney disease 10/21/2018  . Atherosclerosis of native arteries of extremity with rest pain ([place]) 10/13/2018  . PAD (peripheral artery disease) ([place]) 07/21/2018  . Ischemic leg 06/25/2018  . Claudication ([place]) 03/17/2018  . B12 deficiency 03/12/2018  . Vitamin B1 deficiency 03/12/2018  . Varicose veins of leg with pain, right 03/12/2018  . Enlarged thoracic aorta ([place]) 02/24/2018  . Alcoholism /alcohol abuse ([place]) 02/24/2018  . Paresthesia 02/24/2018  . Ectatic aorta ([place]) 02/20/2018  . Emphysema lung ([place]) 02/20/2018  . Atherosclerosis of aorta ([place]) 02/20/2018  . Encounter for tobacco use cessation counseling   .  Benign neoplasm of cecum   . Benign neoplasm of transverse colon   . Benign neoplasm of sigmoid colon   . Hypertension, benign 08/31/2015  . Tobacco use 08/31/2015    Past Surgical History:  Procedure Laterality Date  . ABDOMINAL AORTOGRAM W/LOWER EXTREMITY N/A 09/13/2019   Procedure: ABDOMINAL AORTOGRAM W/LOWER EXTREMITY;  Surgeon: Waynetta Sandy, MD;  Location: [facility];  Service: Cardiovascular;  Laterality: N/A;  . COLONOSCOPY WITH PROPOFOL N/A 02/15/2016   Procedure:  COLONOSCOPY WITH PROPOFOL;  Surgeon: Lucilla Lame, MD;  Location: [facility];  Service: Endoscopy;  Laterality: N/A;  . FEMORAL-POPLITEAL BYPASS GRAFT Right 09/14/2019   Procedure: BYPASS GRAFT FEMORAL-POPLITEAL ARTERY;  Surgeon: Waynetta Sandy, MD;  Location: [facility];  Service: Vascular;  Laterality: Right;  . HERNIA REPAIR  1999   left inguinal  . INSERTION OF ILIAC STENT Right 09/14/2019   Procedure: Insertion Of Common and External Iliac Stent;  Surgeon: Waynetta Sandy, MD;  Location: [facility];  Service: Vascular;  Laterality: Right;  . LOWER EXTREMITY ANGIOGRAPHY Right 05/07/2018   Procedure: LOWER EXTREMITY ANGIOGRAPHY;  Surgeon: Algernon Huxley, MD;  Location: [facility];  Service: Cardiovascular;  Laterality: Right;  . LOWER EXTREMITY ANGIOGRAPHY Right 06/25/2018   Procedure: LOWER EXTREMITY ANGIOGRAPHY;  Surgeon: Algernon Huxley, MD;  Location: [facility];  Service: Cardiovascular;  Laterality: Right;  . LOWER EXTREMITY ANGIOGRAPHY Right 07/01/2018   Procedure: LOWER EXTREMITY ANGIOGRAPHY;  Surgeon: Algernon Huxley, MD;  Location: [facility];  Service: Cardiovascular;  Laterality: Right;  . LOWER EXTREMITY ANGIOGRAPHY Right 08/12/2018   Procedure: LOWER EXTREMITY ANGIOGRAPHY;  Surgeon: Algernon Huxley, MD;  Location: [place] [facility];  Service: Cardiovascular;  Laterality: Right;  . LOWER EXTREMITY ANGIOGRAPHY Right 09/03/2018   Procedure: LOWER EXTREMITY ANGIOGRAPHY;  Surgeon: Algernon Huxley, MD;  Location: [facility];  Service: Cardiovascular;  Laterality: Right;  . LOWER EXTREMITY ANGIOGRAPHY Right 09/04/2018   Procedure: Lower Extremity Angiography;  Surgeon: Algernon Huxley, MD;  Location: [facility];  Service: Cardiovascular;  Laterality: Right;  . LOWER EXTREMITY ANGIOGRAPHY Right 10/01/2018   Procedure: LOWER EXTREMITY ANGIOGRAPHY;  Surgeon: Algernon Huxley, MD;  Location: [facility];  Service: Cardiovascular;  Laterality:  Right;  . LOWER EXTREMITY ANGIOGRAPHY Right 10/01/2018   Procedure: Lower Extremity Angiography;  Surgeon: Algernon Huxley, MD;  Location: [facility];  Service: Cardiovascular;  Laterality: Right;  . LOWER EXTREMITY ANGIOGRAPHY Right 10/15/2018   Procedure: LOWER EXTREMITY ANGIOGRAPHY;  Surgeon: Algernon Huxley, MD;  Location: [facility];  Service: Cardiovascular;  Laterality: Right;  . LOWER EXTREMITY ANGIOGRAPHY Left 10/16/2018   Procedure: Lower Extremity Angiography;  Surgeon: Algernon Huxley, MD;  Location: [facility];  Service: Cardiovascular;  Laterality: Left;  . LOWER EXTREMITY ANGIOGRAPHY Left 01/20/2019   Procedure: LOWER EXTREMITY ANGIOGRAPHY;  Surgeon: Algernon Huxley, MD;  Location: [facility];  Service: Cardiovascular;  Laterality: Left;  . LOWER EXTREMITY ANGIOGRAPHY Right 01/21/2019   Procedure: Lower Extremity Angiography;  Surgeon: Algernon Huxley, MD;  Location: [facility];  Service: Cardiovascular;  Laterality: Right;  . LOWER EXTREMITY ANGIOGRAPHY Right 01/28/2019   Procedure: LOWER EXTREMITY ANGIOGRAPHY;  Surgeon: Algernon Huxley, MD;  Location: [place]  [facility];  Service: Cardiovascular;  Laterality: Right;  . LOWER EXTREMITY ANGIOGRAPHY Right 01/29/2019   Procedure: Lower Extremity Angiography;  Surgeon: Algernon Huxley,  MD;  Location: [facility];  Service: Cardiovascular;  Laterality: Right;  . LOWER EXTREMITY INTERVENTION Right 07/02/2018   Procedure: LOWER EXTREMITY INTERVENTION;  Surgeon: Algernon Huxley, MD;  Location: [facility];  Service: Cardiovascular;  Laterality: Right;  . POLYPECTOMY N/A 02/15/2016   Procedure: POLYPECTOMY;  Surgeon: Lucilla Lame, MD;  Location: [facility];  Service: Endoscopy;  Laterality: N/A;    Family History  Problem Relation Age of Onset  . COPD Mother   . Hypertension Father   . Heart attack Brother      Current Outpatient Medications:  .  acetaminophen (TYLENOL) 500 MG tablet, Take  1 tablet (500 mg total) by mouth 2 (two) times daily. (Patient taking differently: Take 500 mg by mouth every 6 (six) hours as needed for mild pain or moderate pain. ), Disp: 100 tablet, Rfl: 0 .  amLODipine (NORVASC) 5 MG tablet, Take 1 tablet (5 mg total) by mouth daily., Disp: 90 tablet, Rfl: 0 .  apixaban (ELIQUIS) 5 MG TABS tablet, Take 1 tablet (5 mg total) by mouth 2 (two) times daily., Disp: 180 tablet, Rfl: 3 .  aspirin EC 81 MG tablet, Take 1 tablet (81 mg total) by mouth daily., Disp: 90 tablet, Rfl: 3 .  atorvastatin (LIPITOR) 20 MG tablet, Take 1 tablet by mouth once daily (Patient taking differently: Take 20 mg by mouth daily. ), Disp: 90 tablet, Rfl: 0 .  Cyanocobalamin (VITAMIN B-12) 1000 MCG SUBL, Place 1 tablet (1,000 mcg total) under the tongue daily. (Patient taking differently: Place 500 mcg under the tongue daily. ), Disp: 90 tablet, Rfl: 0 .  Fluticasone-Umeclidin-Vilant (TRELEGY ELLIPTA) 100-62.5-25 MCG/INH AEPB, Inhale 1 puff into the lungs daily. (Patient taking differently: Inhale 1 puff into the lungs daily as needed (shortness of breath). ), Disp: 60 each, Rfl: 2 .  thiamine (VITAMIN B-1) 50 MG tablet, Take 1 tablet (50 mg total) by mouth daily., Disp: 30 tablet, Rfl: 2 .  clopidogrel (PLAVIX) 75 MG tablet, Take 1 tablet (75 mg total) by mouth daily. (Patient not taking: Reported on 11/12/2019), Disp: 30 tablet, Rfl: 2  No Known Allergies  I personally reviewed active problem list, medication list, allergies, family history, social history, health maintenance with the patient/caregiver today.   ROS  Ten systems reviewed and is negative except as mentioned in HPI   Objective  Virtual encounter, vitals not obtained.  There is no height or weight on file to calculate BMI.  Physical Exam  Awake, alert and oriented  PHQ2/9: Depression screen [facility] 2/9 11/12/2019 10/13/2019 06/29/2019 04/09/2019 02/10/2019  Decreased Interest 0 0 0 0 0  Down, Depressed, Hopeless 0 0 0 0  0  PHQ - 2 Score 0 0 0 0 0  Altered sleeping 0 0 0 0 0  Tired, decreased energy 0 0 0 0 0  Change in appetite 0 0 0 0 0  Feeling bad or failure about yourself  0 0 0 0 0  Trouble concentrating 0 0 0 0 0  Moving slowly or fidgety/restless 0 0 0 0 0  Suicidal thoughts 0 0 0 0 0  PHQ-9 Score 0 0 0 0 0  Difficult doing work/chores Not difficult at all - Not difficult at all - Not difficult at all  Some recent data might be hidden   PHQ-2/9 Result is negative.    Fall Risk: Fall Risk  11/12/2019 10/13/2019 06/29/2019 02/10/2019 11/20/2018  Falls in the past year? 0 0 0 0 0  Number falls in past yr: 0 0 0 0 0  Injury with Fall? 0 0 0 0 0  Follow up Falls evaluation completed - - - -     Assessment & Plan  1. PAD (peripheral artery disease) (HCC)  - clopidogrel (PLAVIX) 75 MG tablet; Take 1 tablet (75 mg total) by mouth daily.  Dispense: 90 tablet; Refill: 1 - atorvastatin (LIPITOR) 20 MG tablet; Take 1 tablet (20 mg total) by mouth daily.  Dispense: 90 tablet; Refill: 1  2. History of femoropopliteal bypass  - clopidogrel (PLAVIX) 75 MG tablet; Take 1 tablet (75 mg total) by mouth daily.  Dispense: 90 tablet; Refill: 1  3. Chronic obstructive pulmonary disease, unspecified COPD type ([place])  - Fluticasone-Umeclidin-Vilant (TRELEGY ELLIPTA) 100-62.5-25 MCG/INH AEPB; Inhale 1 puff into the lungs daily as needed (shortness of breath).  Dispense: 60 each; Refill: 5  4. Atherosclerosis of aorta (HCC)  - atorvastatin (LIPITOR) 20 MG tablet; Take 1 tablet (20 mg total) by mouth daily.  Dispense: 90 tablet; Refill: 1  5. Hypertension, benign  Monitor at home for nwo   I discussed the assessment and treatment plan with the patient. The patient was provided an opportunity to ask questions and all were answered. The patient agreed with the plan and demonstrated an understanding of the instructions.   The patient was advised to call back or seek an in-person evaluation if the symptoms worsen  or if the condition fails to improve as anticipated.  I provided 25 minutes of non-face-to-face time during this encounter.  Loistine Chance, MD

## 2019-11-15 ENCOUNTER — Ambulatory Visit (INDEPENDENT_AMBULATORY_CARE_PROVIDER_SITE_OTHER): Payer: Self-pay | Admitting: Physician Assistant

## 2019-11-15 ENCOUNTER — Ambulatory Visit (INDEPENDENT_AMBULATORY_CARE_PROVIDER_SITE_OTHER)
Admission: RE | Admit: 2019-11-15 | Discharge: 2019-11-15 | Disposition: A | Discharge status: Home / Self Care | Payer: BLUE CROSS/BLUE SHIELD | Source: Ambulatory Visit | Attending: Surgery | Admitting: Surgery

## 2019-11-15 ENCOUNTER — Ambulatory Visit (HOSPITAL_COMMUNITY)
Admission: RE | Admit: 2019-11-15 | Discharge: 2019-11-15 | Disposition: A | Discharge status: Home / Self Care | Payer: BLUE CROSS/BLUE SHIELD | Source: Ambulatory Visit | Attending: Surgery | Admitting: Surgery

## 2019-11-15 ENCOUNTER — Other Ambulatory Visit: Payer: Self-pay

## 2019-11-15 VITALS — BP 151/100 | HR 92 | Temp 97.3°F | Resp 16 | Ht 68.0 in | Wt 148.0 lb

## 2019-11-15 DIAGNOSIS — I739 Peripheral vascular disease, unspecified: Secondary | ICD-10-CM | POA: Diagnosis not present

## 2019-11-15 DIAGNOSIS — I998 Other disorder of circulatory system: Secondary | ICD-10-CM

## 2019-11-15 NOTE — Progress Notes (Addendum)
    Postoperative Visit   History of Present Illness   Ryan [name] is a 64 y.o. year old male who presents for postoperative follow-up after right common and external iliac artery stenting, iliofemoral endarterectomy, and femoral to anterior tibial artery bypass with composite by Dr. [name] on 09/14/2019.  This was performed due to ischemic rest pain of right lower extremity.  Patient states he had some numbness and short episode of pain in right foot this past Friday however pain has since resolved.  He still has some numbness in his foot however this is tolerable.  He denies any pain at night or any nonhealing wounds.  He also states his right leg feels much better compared to before bypass surgery.  He is on aspirin, Plavix, and statin daily.  He denies tobacco use.   For VQI Use Only   PRE-ADM LIVING: Home  AMB STATUS: Ambulatory   Physical Examination   Vitals:   11/15/19 1510  BP: (!) 151/100  Pulse: 92  Resp: 16  Temp: (!) 97.3 F (36.3 C)  TempSrc: Temporal  SpO2: 99%  Weight: 148 lb (67.1 kg)  Height: 5\' 8"  (1.727 m)    RLE: Incisions are healed; feet are symmetrically warm to touch; no non healing wounds of R foot  RLE bypass occluded based on duplex   Medical Decision Making   [name] is a 64 y.o. year old male who presents s/p R CIA and EIA stent, iliofemoral endarterectomy and femoral to ATA composite bypass  . Right lower extremity bypass is occluded based on duplex today; this likely occurred this past Friday based on patient's symptoms . Patient states he is without rest pain and right foot still feels better compared to before surgery; this is likely due to iliac stenting and iliofemoral endarterectomy . Plan will be to not pursue revascularization at this time . I stressed the importance of protecting his right foot and continuing to ambulate as much as possible . This case will be discussed with Dr. [name] and patient will be updated with any  changes to plan . Patient will call/return to office if rest pain or nonhealing wounds develop   ADDENDUM: This case was discussed was Dr. [name].  Plan is to not pursue revascularization at this time.  Patient is without rest pain and without non healing wounds despite thrombosis of bypass graft.  We will bring him back in 3 months to make sure he is still doing well.  Dagoberto Ligas PA-C [facility] Office: [phone]  Clinic MD: Trula Slade

## 2019-12-06 ENCOUNTER — Emergency Department: Payer: BLUE CROSS/BLUE SHIELD

## 2019-12-06 ENCOUNTER — Emergency Department
Admission: EM | Admit: 2019-12-06 | Discharge: 2019-12-06 | Disposition: A | Discharge status: Home / Self Care | Payer: BLUE CROSS/BLUE SHIELD | Attending: Emergency Medicine | Admitting: Emergency Medicine

## 2019-12-06 ENCOUNTER — Other Ambulatory Visit: Payer: Self-pay

## 2019-12-06 DIAGNOSIS — Z20822 Contact with and (suspected) exposure to covid-19: Secondary | ICD-10-CM | POA: Insufficient documentation

## 2019-12-06 DIAGNOSIS — Z7982 Long term (current) use of aspirin: Secondary | ICD-10-CM | POA: Diagnosis not present

## 2019-12-06 DIAGNOSIS — R079 Chest pain, unspecified: Secondary | ICD-10-CM | POA: Diagnosis present

## 2019-12-06 DIAGNOSIS — Z7901 Long term (current) use of anticoagulants: Secondary | ICD-10-CM | POA: Diagnosis not present

## 2019-12-06 DIAGNOSIS — I129 Hypertensive chronic kidney disease with stage 1 through stage 4 chronic kidney disease, or unspecified chronic kidney disease: Secondary | ICD-10-CM | POA: Insufficient documentation

## 2019-12-06 DIAGNOSIS — Z87891 Personal history of nicotine dependence: Secondary | ICD-10-CM | POA: Diagnosis not present

## 2019-12-06 DIAGNOSIS — R0789 Other chest pain: Secondary | ICD-10-CM | POA: Diagnosis not present

## 2019-12-06 DIAGNOSIS — R062 Wheezing: Secondary | ICD-10-CM | POA: Diagnosis not present

## 2019-12-06 DIAGNOSIS — Z79899 Other long term (current) drug therapy: Secondary | ICD-10-CM | POA: Insufficient documentation

## 2019-12-06 DIAGNOSIS — N183 Chronic kidney disease, stage 3 unspecified: Secondary | ICD-10-CM | POA: Diagnosis not present

## 2019-12-06 DIAGNOSIS — J40 Bronchitis, not specified as acute or chronic: Secondary | ICD-10-CM | POA: Diagnosis not present

## 2019-12-06 LAB — CBC
HCT: 36.7 % — ABNORMAL LOW (ref 39.0–52.0)
Hemoglobin: 12.3 g/dL — ABNORMAL LOW (ref 13.0–17.0)
MCH: 29.5 pg (ref 26.0–34.0)
MCHC: 33.5 g/dL (ref 30.0–36.0)
MCV: 88 fL (ref 80.0–100.0)
Platelets: 299 10*3/uL (ref 150–400)
RBC: 4.17 MIL/uL — ABNORMAL LOW (ref 4.22–5.81)
RDW: 14.6 % (ref 11.5–15.5)
WBC: 5.6 10*3/uL (ref 4.0–10.5)
nRBC: 0 % (ref 0.0–0.2)

## 2019-12-06 LAB — BASIC METABOLIC PANEL
Anion gap: 11 (ref 5–15)
BUN: 15 mg/dL (ref 8–23)
CO2: 20 mmol/L — ABNORMAL LOW (ref 22–32)
Calcium: 9.4 mg/dL (ref 8.9–10.3)
Chloride: 106 mmol/L (ref 98–111)
Creatinine, Ser: 1.62 mg/dL — ABNORMAL HIGH (ref 0.61–1.24)
GFR calc Af Amer: 52 mL/min — ABNORMAL LOW (ref >60)
GFR calc non Af Amer: 45 mL/min — ABNORMAL LOW (ref >60)
Glucose, Bld: 96 mg/dL (ref 70–99)
Potassium: 4 mmol/L (ref 3.5–5.1)
Sodium: 137 mmol/L (ref 135–145)

## 2019-12-06 LAB — TROPONIN I (HIGH SENSITIVITY)
Troponin I (High Sensitivity): 5 ng/L (ref <18)
Troponin I (High Sensitivity): 5 ng/L (ref <18)

## 2019-12-06 LAB — SARS CORONAVIRUS 2 BY RT PCR (HOSPITAL ORDER, PERFORMED IN ~~LOC~~ HOSPITAL LAB): SARS Coronavirus 2: NEGATIVE

## 2019-12-06 MED ORDER — ALBUTEROL SULFATE (2.5 MG/3ML) 0.083% IN NEBU
2.5000 mg | INHALATION_SOLUTION | RESPIRATORY_TRACT | Status: DC | PRN
  Administered 2019-12-06: 2.5 mg via RESPIRATORY_TRACT
  Filled 2019-12-06: qty 3

## 2019-12-06 MED ORDER — ALBUTEROL SULFATE HFA 108 (90 BASE) MCG/ACT IN AERS: 2.0000 | INHALATION_SPRAY | Freq: Once | RESPIRATORY_TRACT | Status: DC

## 2019-12-06 MED ORDER — PREDNISONE 20 MG PO TABS
40.0000 mg | ORAL_TABLET | Freq: Every day | ORAL | 0 refills | Status: AC
Start: 2019-12-06 — End: 2019-12-11

## 2019-12-06 MED ORDER — SODIUM CHLORIDE 0.9% FLUSH: 3.0000 mL | Freq: Once | INTRAVENOUS | Status: DC

## 2019-12-06 MED ORDER — AZITHROMYCIN 250 MG PO TABS: ORAL_TABLET | ORAL | 0 refills | Status: DC

## 2019-12-06 MED ORDER — ALBUTEROL SULFATE HFA 108 (90 BASE) MCG/ACT IN AERS
2.0000 | INHALATION_SPRAY | Freq: Four times a day (QID) | RESPIRATORY_TRACT | 0 refills | Status: DC | PRN
Start: 2019-12-06 — End: 2020-03-14

## 2019-12-06 NOTE — ED Triage Notes (Signed)
Pt comes via POV from home with c/o chest pressure, sore throat and cough. Pt states this pain started this am. Pt states it just feels like pressure.  Pt states mid sternal CP. Pt states some slight SOB

## 2019-12-06 NOTE — ED Provider Notes (Signed)
[facility] Emergency Department Provider Note  ____________________________________________   First MD Initiated Contact with Patient 12/06/19 1639     (approximate)  I have reviewed the triage vital signs and the nursing notes.   HISTORY  Chief Complaint Chest Pain    HPI Ryan [name] is a 64 y.o. male with past medical history of hypertension, hyperlipidemia, chronic kidney disease, here with chest pain.  Patient was in his usual state of health yesterday.  He states he awoke today with mild cough, wheezing, and sensation of a fullness and pressure in his chest.  This is persisted throughout the day today.  He states he had a mild nasal congestion and runny nose, and feels like he may have Covid.  He admits that he has been out and driving around with the windows down as well.  He does have a history of COPD on Trelegy.  Denies any recent increase sputum production.  Denies any fevers.  His pain is more of a constant pressure without specific associated shortness of breath, diaphoresis, lightheadedness, nausea, or other associated symptoms.  Denies known history of coronary disease or stents.  No recent medication changes.       Past Medical History:  Diagnosis Date   Hyperlipidemia    Hypertension    Neuromuscular disorder ([place])    numbness feet   Peripheral vascular disease ([place])    Shortness of breath dyspnea     Patient Active Problem List   Diagnosis Date Noted   Ischemia of right lower extremity 01/28/2019   Iron deficiency anemia 11/23/2018   Stage 3 chronic kidney disease 10/21/2018   Atherosclerosis of native arteries of extremity with rest pain ([place]) 10/13/2018   PAD (peripheral artery disease) ([place]) 07/21/2018   Ischemic leg 06/25/2018   Claudication ([place]) 03/17/2018   B12 deficiency 03/12/2018   Vitamin B1 deficiency 03/12/2018   Varicose veins of leg with pain, right 03/12/2018   Enlarged thoracic aorta ([place])  02/24/2018   Alcoholism /alcohol abuse ([place]) 02/24/2018   Paresthesia 02/24/2018   Ectatic aorta ([place]) 02/20/2018   Emphysema lung ([place]) 02/20/2018   Atherosclerosis of aorta ([place]) 02/20/2018   Encounter for tobacco use cessation counseling    Benign neoplasm of cecum    Benign neoplasm of transverse colon    Benign neoplasm of sigmoid colon    Hypertension, benign 08/31/2015   Tobacco use 08/31/2015    Past Surgical History:  Procedure Laterality Date   ABDOMINAL AORTOGRAM W/LOWER EXTREMITY N/A 09/13/2019   Procedure: ABDOMINAL AORTOGRAM W/LOWER EXTREMITY;  Surgeon: Waynetta Sandy, MD;  Location: [place] [facility];  Service: Cardiovascular;  Laterality: N/A;   COLONOSCOPY WITH PROPOFOL N/A 02/15/2016   Procedure: COLONOSCOPY WITH PROPOFOL;  Surgeon: Lucilla Lame, MD;  Location: [place];  Service: Endoscopy;  Laterality: N/A;   FEMORAL-POPLITEAL BYPASS GRAFT Right 09/14/2019   Procedure: BYPASS GRAFT FEMORAL-POPLITEAL ARTERY;  Surgeon: Waynetta Sandy, MD;  Location: [place];  Service: Vascular;  Laterality: Right;   HERNIA REPAIR  1999   left inguinal   INSERTION OF ILIAC STENT Right 09/14/2019   Procedure: Insertion Of Common and External Iliac Stent;  Surgeon: Waynetta Sandy, MD;  Location: [place];  Service: Vascular;  Laterality: Right;   LOWER EXTREMITY ANGIOGRAPHY Right 05/07/2018   Procedure: LOWER EXTREMITY ANGIOGRAPHY;  Surgeon: Algernon Huxley, MD;  Location: [place] [facility];  Service: Cardiovascular;  Laterality: Right;   LOWER EXTREMITY ANGIOGRAPHY Right 06/25/2018   Procedure:  LOWER EXTREMITY ANGIOGRAPHY;  Surgeon: Algernon Huxley, MD;  Location: [place] [facility];  Service: Cardiovascular;  Laterality: Right;   LOWER EXTREMITY ANGIOGRAPHY Right 07/01/2018   Procedure: LOWER EXTREMITY ANGIOGRAPHY;  Surgeon: Algernon Huxley, MD;  Location: [place] [facility];  Service: Cardiovascular;  Laterality: Right;   LOWER  EXTREMITY ANGIOGRAPHY Right 08/12/2018   Procedure: LOWER EXTREMITY ANGIOGRAPHY;  Surgeon: Algernon Huxley, MD;  Location: [place] [facility];  Service: Cardiovascular;  Laterality: Right;   LOWER EXTREMITY ANGIOGRAPHY Right 09/03/2018   Procedure: LOWER EXTREMITY ANGIOGRAPHY;  Surgeon: Algernon Huxley, MD;  Location: [place] [facility];  Service: Cardiovascular;  Laterality: Right;   LOWER EXTREMITY ANGIOGRAPHY Right 09/04/2018   Procedure: Lower Extremity Angiography;  Surgeon: Algernon Huxley, MD;  Location: [place] [facility];  Service: Cardiovascular;  Laterality: Right;   LOWER EXTREMITY ANGIOGRAPHY Right 10/01/2018   Procedure: LOWER EXTREMITY ANGIOGRAPHY;  Surgeon: Algernon Huxley, MD;  Location: [place] [facility];  Service: Cardiovascular;  Laterality: Right;   LOWER EXTREMITY ANGIOGRAPHY Right 10/01/2018   Procedure: Lower Extremity Angiography;  Surgeon: Algernon Huxley, MD;  Location: [place] [facility];  Service: Cardiovascular;  Laterality: Right;   LOWER EXTREMITY ANGIOGRAPHY Right 10/15/2018   Procedure: LOWER EXTREMITY ANGIOGRAPHY;  Surgeon: Algernon Huxley, MD;  Location: [place] [facility];  Service: Cardiovascular;  Laterality: Right;   LOWER EXTREMITY ANGIOGRAPHY Left 10/16/2018   Procedure: Lower Extremity Angiography;  Surgeon: Algernon Huxley, MD;  Location: [place] [facility];  Service: Cardiovascular;  Laterality: Left;   LOWER EXTREMITY ANGIOGRAPHY Left 01/20/2019   Procedure: LOWER EXTREMITY ANGIOGRAPHY;  Surgeon: Algernon Huxley, MD;  Location: [place] [facility];  Service: Cardiovascular;  Laterality: Left;   LOWER EXTREMITY ANGIOGRAPHY Right 01/21/2019   Procedure: Lower Extremity Angiography;  Surgeon: Algernon Huxley, MD;  Location: [place] [facility];  Service: Cardiovascular;  Laterality: Right;   LOWER EXTREMITY ANGIOGRAPHY Right 01/28/2019   Procedure: LOWER EXTREMITY ANGIOGRAPHY;  Surgeon: Algernon Huxley, MD;  Location: [place] [facility];  Service: Cardiovascular;   Laterality: Right;   LOWER EXTREMITY ANGIOGRAPHY Right 01/29/2019   Procedure: Lower Extremity Angiography;  Surgeon: Algernon Huxley, MD;  Location: [place] [facility];  Service: Cardiovascular;  Laterality: Right;   LOWER EXTREMITY INTERVENTION Right 07/02/2018   Procedure: LOWER EXTREMITY INTERVENTION;  Surgeon: Algernon Huxley, MD;  Location: [place] [facility];  Service: Cardiovascular;  Laterality: Right;   POLYPECTOMY N/A 02/15/2016   Procedure: POLYPECTOMY;  Surgeon: [place]illa Lame, MD;  Location: [place];  Service: Endoscopy;  Laterality: N/A;    Prior to Admission medications   Medication Sig Start Date End Date Taking? Authorizing Provider  acetaminophen (TYLENOL) 500 MG tablet Take 1 tablet (500 mg total) by mouth 2 (two) times daily. Patient taking differently: Take 500 mg by mouth every 6 (six) hours as needed for mild pain or moderate pain.  02/10/19   Steele Sizer, MD  albuterol (VENTOLIN HFA) 108 (90 Base) MCG/ACT inhaler Inhale 2 puffs into the lungs every 6 (six) hours as needed for wheezing or shortness of breath. 12/06/19   Duffy Bruce, MD  amLODipine (NORVASC) 5 MG tablet Take 1 tablet (5 mg total) by mouth daily. 10/13/19   Steele Sizer, MD  aspirin EC 81 MG tablet Take 1 tablet (81 mg total) by mouth daily. 08/13/18   Stegmayer, Joelene Millin A, PA-C  atorvastatin (LIPITOR) 20 MG tablet Take 1 tablet (20  mg total) by mouth daily. 11/12/19   Steele Sizer, MD  azithromycin (ZITHROMAX Z-PAK) 250 MG tablet Take 2 tablets (500 mg) on  Day 1,  followed by 1 tablet (250 mg) once daily on Days 2 through 5. 12/06/19   Duffy Bruce, MD  clopidogrel (PLAVIX) 75 MG tablet Take 1 tablet (75 mg total) by mouth daily. 11/12/19 02/10/20  Steele Sizer, MD  Cyanocobalamin (VITAMIN B-12) 1000 MCG SUBL Place 1 tablet (1,000 mcg total) under the tongue daily. Patient taking differently: Place 500 mcg under the tongue daily.  04/30/18   Steele Sizer, MD   Fluticasone-Umeclidin-Vilant (TRELEGY ELLIPTA) 100-62.5-25 MCG/INH AEPB Inhale 1 puff into the lungs daily as needed (shortness of breath). 11/12/19   Steele Sizer, MD  predniSONE (DELTASONE) 20 MG tablet Take 2 tablets (40 mg total) by mouth daily for 5 days. 12/06/19 12/11/19  Duffy Bruce, MD  thiamine (VITAMIN B-1) 50 MG tablet Take 1 tablet (50 mg total) by mouth daily. 06/29/19   Steele Sizer, MD    Allergies Patient has no known allergies.  Family History  Problem Relation Age of Onset   COPD Mother    Hypertension Father    Heart attack Brother     Social History Social History   Tobacco Use   Smoking status: Former Smoker    Packs/day: 0.50    Years: 40.00    Pack years: 20.00    Types: Cigarettes    Start date: 07/21/1978    Quit date: 12/2018    Years since quitting: 0.9   Smokeless tobacco: Never Used  Vaping Use   Vaping Use: Never used  Substance Use Topics   Alcohol use: Yes    Alcohol/week: 12.0 standard drinks    Types: 12 Cans of beer per week   Drug use: Never    Review of Systems  Review of Systems  Constitutional: Negative for chills, fatigue and fever.  HENT: Negative for sore throat.   Respiratory: Positive for cough. Negative for shortness of breath.   Cardiovascular: Positive for chest pain.  Gastrointestinal: Negative for abdominal pain.  Genitourinary: Negative for flank pain.  Musculoskeletal: Negative for neck pain.  Skin: Negative for rash and wound.  Allergic/Immunologic: Negative for immunocompromised state.  Neurological: Negative for weakness and numbness.  Hematological: Does not bruise/bleed easily.     ____________________________________________  PHYSICAL EXAM:      VITAL SIGNS: ED Triage Vitals  Enc Vitals Group     BP 12/06/19 1439 (!) 129/95     Pulse Rate 12/06/19 1439 91     Resp 12/06/19 1439 17     Temp 12/06/19 1439 98.8 F (37.1 C)     Temp src --      SpO2 12/06/19 1439 99 %     Weight  12/06/19 1440 150 lb (68 kg)     Height 12/06/19 1440 5\' 8"  (1.727 m)     Head Circumference --      [place]ak Flow --      Pain Score 12/06/19 1440 5     Pain Loc --      Pain Edu? --      Excl. in [place]? --      Physical Exam Vitals and nursing note reviewed.  Constitutional:      General: He is not in acute distress.    Appearance: He is well-developed.  HENT:     Head: Normocephalic and atraumatic.  Eyes:     Conjunctiva/sclera: Conjunctivae normal.  Cardiovascular:  Rate and Rhythm: Normal rate and regular rhythm.     Heart sounds: Normal heart sounds. No murmur heard.  No friction rub.  Pulmonary:     Effort: Pulmonary effort is normal. No respiratory distress.     Breath sounds: Decreased breath sounds and wheezing (Scant, expiratory) present. No rales.  Abdominal:     General: There is no distension.     Palpations: Abdomen is soft.     Tenderness: There is no abdominal tenderness.  Musculoskeletal:     Cervical back: Neck supple.  Skin:    General: Skin is warm.     Capillary Refill: Capillary refill takes less than 2 seconds.  Neurological:     Mental Status: He is alert and oriented to person, place, and time.     Motor: No abnormal muscle tone.       ____________________________________________   LABS (all labs ordered are listed, but only abnormal results are displayed)  Labs Reviewed  BASIC METABOLIC PANEL - Abnormal; Notable for the following components:      Result Value   CO2 20 (*)    Creatinine, Ser 1.62 (*)    GFR calc non Af Amer 45 (*)    GFR calc Af Amer 52 (*)    All other components within normal limits  CBC - Abnormal; Notable for the following components:   RBC 4.17 (*)    Hemoglobin 12.3 (*)    HCT 36.7 (*)    All other components within normal limits  SARS CORONAVIRUS 2 BY RT PCR (HOSPITAL ORDER, [place] [facility])  TROPONIN I (HIGH SENSITIVITY)  TROPONIN I (HIGH SENSITIVITY)     ____________________________________________  EKG: Normal sinus rhythm, ventricular rate 82.  PR 152, QRS 74, QTc 439.  No acute ST elevations or depressions. ________________________________________  RADIOLOGY All imaging, including plain films, CT scans, and ultrasounds, independently reviewed by me, and interpretations confirmed via formal radiology reads.  ED MD interpretation:   Chest x-ray: Clear, no significant change from prior  Official radiology report(s): DG Chest 2 View  Result Date: 12/06/2019 CLINICAL DATA:  Chest pain sore throat and cough EXAM: CHEST - 2 VIEW COMPARISON:  07/18/2019 FINDINGS: Chronic pleural thickening and calcification in the left lung base is unchanged from the prior study. No superimposed acute infiltrate effusion or mass. Right lung clear. Heart size and vascularity normal. No acute skeletal abnormality. IMPRESSION: No acute abnormality, no change from the prior study. Calcific pleural thickening in the left lung base unchanged. Electronically Signed   By: Franchot Gallo M.D.   On: 12/06/2019 15:18    ____________________________________________  PROCEDURES   Procedure(s) performed (including Critical Care):  .1-3 Lead EKG Interpretation Performed by: Duffy Bruce, MD Authorized by: Duffy Bruce, MD     Interpretation: normal     ECG rate:  70-90s   ECG rate assessment: normal     Rhythm: sinus rhythm     Ectopy: none     Conduction: normal   Comments:     Indication: Chest pain/pressure    ____________________________________________  INITIAL IMPRESSION / MDM / ASSESSMENT AND PLAN / ED COURSE  As part of my medical decision making, I reviewed the following data within the [place] notes reviewed and incorporated, Old chart reviewed, Notes from prior ED visits, and [place] Controlled Substance Database       *Ryan [name] was evaluated in Emergency Department on 12/06/2019 for the symptoms  described in the history  of present illness. He was evaluated in the context of the global COVID-19 pandemic, which necessitated consideration that the patient might be at risk for infection with the SARS-CoV-2 virus that causes COVID-19. Institutional protocols and algorithms that pertain to the evaluation of patients at risk for COVID-19 are in a state of rapid change based on information released by regulatory bodies including the CDC and federal and state organizations. These policies and algorithms were followed during the patient's care in the ED.  Some ED evaluations and interventions may be delayed as a result of limited staffing during the pandemic.*     Medical Decision Making:  64 yo M with PMHx above here with chest pain, somewhat atypical. Exam as above. Pt is in NAD on arrival. EKG nonischemic.  CXR is clear. Suspect COPD exacerbation vs bronchitis w/ cough, SOB, mild wheezing. Satting well on RA. His CP is likely related to this. He is not tahcycardic, tachypneic, or hypoxic with no s/s PE. EKG is nonischemic as mentioned and trop neg x 2, doubt ACS. Pain is not c/w dissection. COVID negative. Will treat with steroids/azithro given sputum production, albuterol, and outpt follow-up. Encouraged hydration and rest at home.  ____________________________________________  FINAL CLINICAL IMPRESSION(S) / ED DIAGNOSES  Final diagnoses:  Atypical chest pain  Bronchitis     MEDICATIONS GIVEN DURING THIS VISIT:  Medications  sodium chloride flush (NS) 0.9 % injection 3 mL (3 mLs Intravenous Not Given 12/06/19 1646)  albuterol (PROVENTIL) (2.5 MG/3ML) 0.083% nebulizer solution 2.5 mg (2.5 mg Nebulization Given 12/06/19 1829)     ED Discharge Orders         Ordered    azithromycin (ZITHROMAX Z-PAK) 250 MG tablet     Discontinue  Reprint     12/06/19 1909    predniSONE (DELTASONE) 20 MG tablet  Daily     Discontinue  Reprint     12/06/19 1909    albuterol (VENTOLIN HFA) 108 (90 Base)  MCG/ACT inhaler  Every 6 hours PRN     Discontinue  Reprint     12/06/19 1909           Note:  This document was prepared using Dragon voice recognition software and may include unintentional dictation errors.   Duffy Bruce, MD 12/06/19 2034

## 2019-12-06 NOTE — ED Triage Notes (Signed)
First Nurse Note:  C/O cough and sore throat x 1 day.  AAOx3.  Skin warm and dry. NAD.

## 2020-02-02 ENCOUNTER — Telehealth: Payer: Self-pay | Admitting: *Deleted

## 2020-02-02 NOTE — Telephone Encounter (Addendum)
(  02/02/2020) Pt notified that lung cancer screening imaging is due currently or in the near future. Verified smoking history (Former Smoker since 2020, 0.5 ppd). He wasn't sure if he had new insurance or not, so he gave his current policy information; [place], ID#: [id number]. Tentative appt on 03/16/20 @ 8:00 am SRW

## 2020-02-04 ENCOUNTER — Other Ambulatory Visit: Payer: Self-pay | Admitting: *Deleted

## 2020-02-04 ENCOUNTER — Other Ambulatory Visit: Payer: Self-pay | Admitting: Family Medicine

## 2020-02-04 DIAGNOSIS — Z122 Encounter for screening for malignant neoplasm of respiratory organs: Secondary | ICD-10-CM

## 2020-02-04 DIAGNOSIS — Z87891 Personal history of nicotine dependence: Secondary | ICD-10-CM

## 2020-02-04 DIAGNOSIS — I10 Essential (primary) hypertension: Secondary | ICD-10-CM

## 2020-02-18 ENCOUNTER — Encounter: Payer: Self-pay | Admitting: Vascular Surgery

## 2020-02-18 ENCOUNTER — Ambulatory Visit (INDEPENDENT_AMBULATORY_CARE_PROVIDER_SITE_OTHER): Payer: Self-pay | Admitting: Vascular Surgery

## 2020-02-18 ENCOUNTER — Other Ambulatory Visit: Payer: Self-pay

## 2020-02-18 VITALS — BP 156/98 | HR 92 | Temp 97.9°F | Resp 20 | Ht 68.0 in | Wt 145.8 lb

## 2020-02-18 DIAGNOSIS — I739 Peripheral vascular disease, unspecified: Secondary | ICD-10-CM

## 2020-02-18 NOTE — Progress Notes (Signed)
Patient ID: Ryan [name], male   DOB: 1955-08-29, 64 y.o.   MRN: [id number]  Reason for Consult: No chief complaint on file.   Referred by Steele Sizer, MD  Subjective:     HPI:  Ryan [name] is a 64 y.o. male status post right common and external iliac artery stenting with iliofemoral endarterectomy and femoral to anterior tibial artery bypass graft back in March.  This was after multiple endovascular procedures had failed.  He has known occluded graft since May.  States that he has returned to work.  He denies any rest pain in his right foot.  He does have some nonhealing wound of the distal right leg also does have long distance claudication at this time is not life limiting.  He is on aspirin and a statin.  Past Medical History:  Diagnosis Date  . Hyperlipidemia   . Hypertension   . Neuromuscular disorder (HCC)    numbness feet  . Peripheral vascular disease ([place])   . Shortness of breath dyspnea    Family History  Problem Relation Age of Onset  . COPD Mother   . Hypertension Father   . Heart attack Brother    Past Surgical History:  Procedure Laterality Date  . ABDOMINAL AORTOGRAM W/LOWER EXTREMITY N/A 09/13/2019   Procedure: ABDOMINAL AORTOGRAM W/LOWER EXTREMITY;  Surgeon: Waynetta Sandy, MD;  Location: [place] [facility];  Service: Cardiovascular;  Laterality: N/A;  . COLONOSCOPY WITH PROPOFOL N/A 02/15/2016   Procedure: COLONOSCOPY WITH PROPOFOL;  Surgeon: Lucilla Lame, MD;  Location: [place];  Service: Endoscopy;  Laterality: N/A;  . FEMORAL-POPLITEAL BYPASS GRAFT Right 09/14/2019   Procedure: BYPASS GRAFT FEMORAL-POPLITEAL ARTERY;  Surgeon: Waynetta Sandy, MD;  Location: [place];  Service: Vascular;  Laterality: Right;  . HERNIA REPAIR  1999   left inguinal  . INSERTION OF ILIAC STENT Right 09/14/2019   Procedure: Insertion Of Common and External Iliac Stent;  Surgeon: Waynetta Sandy, MD;  Location: [place];  Service:  Vascular;  Laterality: Right;  . LOWER EXTREMITY ANGIOGRAPHY Right 05/07/2018   Procedure: LOWER EXTREMITY ANGIOGRAPHY;  Surgeon: Algernon Huxley, MD;  Location: [place] [facility];  Service: Cardiovascular;  Laterality: Right;  . LOWER EXTREMITY ANGIOGRAPHY Right 06/25/2018   Procedure: LOWER EXTREMITY ANGIOGRAPHY;  Surgeon: Algernon Huxley, MD;  Location: [place] [facility];  Service: Cardiovascular;  Laterality: Right;  . LOWER EXTREMITY ANGIOGRAPHY Right 07/01/2018   Procedure: LOWER EXTREMITY ANGIOGRAPHY;  Surgeon: Algernon Huxley, MD;  Location: [place] [facility];  Service: Cardiovascular;  Laterality: Right;  . LOWER EXTREMITY ANGIOGRAPHY Right 08/12/2018   Procedure: LOWER EXTREMITY ANGIOGRAPHY;  Surgeon: Algernon Huxley, MD;  Location: [place] [facility];  Service: Cardiovascular;  Laterality: Right;  . LOWER EXTREMITY ANGIOGRAPHY Right 09/03/2018   Procedure: LOWER EXTREMITY ANGIOGRAPHY;  Surgeon: Algernon Huxley, MD;  Location: [place] [facility];  Service: Cardiovascular;  Laterality: Right;  . LOWER EXTREMITY ANGIOGRAPHY Right 09/04/2018   Procedure: Lower Extremity Angiography;  Surgeon: Algernon Huxley, MD;  Location: [place] [facility];  Service: Cardiovascular;  Laterality: Right;  . LOWER EXTREMITY ANGIOGRAPHY Right 10/01/2018   Procedure: LOWER EXTREMITY ANGIOGRAPHY;  Surgeon: Algernon Huxley, MD;  Location: [place] [facility];  Service: Cardiovascular;  Laterality: Right;  . LOWER EXTREMITY ANGIOGRAPHY Right 10/01/2018   Procedure: Lower Extremity Angiography;  Surgeon: Algernon Huxley, MD;  Location: [place] [facility];  Service: Cardiovascular;  Laterality: Right;  .  LOWER EXTREMITY ANGIOGRAPHY Right 10/15/2018   Procedure: LOWER EXTREMITY ANGIOGRAPHY;  Surgeon: Algernon Huxley, MD;  Location: [place] [facility];  Service: Cardiovascular;  Laterality: Right;  . LOWER EXTREMITY ANGIOGRAPHY Left 10/16/2018   Procedure: Lower Extremity Angiography;  Surgeon: Algernon Huxley, MD;  Location: [place] [facility];  Service: Cardiovascular;  Laterality: Left;  . LOWER EXTREMITY ANGIOGRAPHY Left 01/20/2019   Procedure: LOWER EXTREMITY ANGIOGRAPHY;  Surgeon: Algernon Huxley, MD;  Location: [place] [facility];  Service: Cardiovascular;  Laterality: Left;  . LOWER EXTREMITY ANGIOGRAPHY Right 01/21/2019   Procedure: Lower Extremity Angiography;  Surgeon: Algernon Huxley, MD;  Location: [place] [facility];  Service: Cardiovascular;  Laterality: Right;  . LOWER EXTREMITY ANGIOGRAPHY Right 01/28/2019   Procedure: LOWER EXTREMITY ANGIOGRAPHY;  Surgeon: Algernon Huxley, MD;  Location: [place] [facility];  Service: Cardiovascular;  Laterality: Right;  . LOWER EXTREMITY ANGIOGRAPHY Right 01/29/2019   Procedure: Lower Extremity Angiography;  Surgeon: Algernon Huxley, MD;  Location: [place] [facility];  Service: Cardiovascular;  Laterality: Right;  . LOWER EXTREMITY INTERVENTION Right 07/02/2018   Procedure: LOWER EXTREMITY INTERVENTION;  Surgeon: Algernon Huxley, MD;  Location: [place] [facility];  Service: Cardiovascular;  Laterality: Right;  . POLYPECTOMY N/A 02/15/2016   Procedure: POLYPECTOMY;  Surgeon: Lucilla Lame, MD;  Location: [place];  Service: Endoscopy;  Laterality: N/A;    Short Social History:  Social History   Tobacco Use  . Smoking status: Former Smoker    Packs/day: 0.50    Years: 40.00    Pack years: 20.00    Types: Cigarettes    Start date: 07/21/1978    Quit date: 12/2018    Years since quitting: 1.1  . Smokeless tobacco: Never Used  Substance Use Topics  . Alcohol use: Yes    Alcohol/week: 12.0 standard drinks    Types: 12 Cans of beer per week    No Known Allergies  Current Outpatient Medications  Medication Sig Dispense Refill  . acetaminophen (TYLENOL) 500 MG tablet Take 1 tablet (500 mg total) by mouth 2 (two) times daily. (Patient taking differently: Take 500 mg by mouth every 6 (six) hours as needed for mild pain or moderate pain. ) 100 tablet 0  . albuterol  (VENTOLIN HFA) 108 (90 Base) MCG/ACT inhaler Inhale 2 puffs into the lungs every 6 (six) hours as needed for wheezing or shortness of breath. 6.7 g 0  . amLODipine (NORVASC) 5 MG tablet Take 1 tablet by mouth once daily 90 tablet 0  . aspirin EC 81 MG tablet Take 1 tablet (81 mg total) by mouth daily. 90 tablet 3  . atorvastatin (LIPITOR) 20 MG tablet Take 1 tablet (20 mg total) by mouth daily. 90 tablet 1  . azithromycin (ZITHROMAX Z-PAK) 250 MG tablet Take 2 tablets (500 mg) on  Day 1,  followed by 1 tablet (250 mg) once daily on Days 2 through 5. 6 each 0  . Cyanocobalamin (VITAMIN B-12) 1000 MCG SUBL Place 1 tablet (1,000 mcg total) under the tongue daily. (Patient taking differently: Place 500 mcg under the tongue daily. ) 90 tablet 0  . Fluticasone-Umeclidin-Vilant (TRELEGY ELLIPTA) 100-62.5-25 MCG/INH AEPB Inhale 1 puff into the lungs daily as needed (shortness of breath). 60 each 5  . thiamine (VITAMIN B-1) 50 MG tablet Take 1 tablet (50 mg total) by mouth daily. 30 tablet 2   No current facility-administered medications for this visit.    Review of  Systems  Constitutional:  Constitutional negative. HENT: HENT negative.  Eyes: Eyes negative.  Respiratory: Respiratory negative.  Cardiovascular: Positive for claudication.  GI: Gastrointestinal negative.  Musculoskeletal: Musculoskeletal negative.  Skin: Positive for wound.  Neurological: Neurological negative. Hematologic: Hematologic/lymphatic negative.  Psychiatric: Psychiatric negative.        Objective:  Objective   Vitals:   02/18/20 1102  BP: (!) 156/98  Pulse: 92  Resp: 20  Temp: 97.9 F (36.6 C)  SpO2: 97%    Physical Exam HENT:     Head: Normocephalic.     Nose:     Comments: Mask in place Eyes:     Extraocular Movements: Extraocular movements intact.     Pupils: Pupils are equal, round, and reactive to light.  Cardiovascular:     Pulses:          Radial pulses are 2+ on the right side and 2+ on the  left side.       Femoral pulses are 2+ on the right side and 2+ on the left side.      Popliteal pulses are 0 on the right side and 2+ on the left side.       Dorsalis pedis pulses are 0 on the right side.       Posterior tibial pulses are 0 on the right side.  Pulmonary:     Effort: Pulmonary effort is normal.  Abdominal:     Palpations: Abdomen is soft.  Musculoskeletal:        General: No swelling. Normal range of motion.  Skin:    Capillary Refill: Capillary refill takes 2 to 3 seconds.     Comments: There is a 2 cm x 2 cm ulceration with eschar over the right lateral leg  Neurological:     General: No focal deficit present.     Mental Status: He is alert.  Psychiatric:        Mood and Affect: Mood normal.        Behavior: Behavior normal.        Thought Content: Thought content normal.        Judgment: Judgment normal.     Data: No new studies     Assessment/Plan:     64 year old male underwent multiple endovascular procedures at outside hospitals then presented to me with occluded stents and we planned for right femoral to anterior tibial artery bypass which was performed with composite graft and vein.  He also had an iliofemoral endarterectomy with retrograde common external iliac artery stenting.  This was likely the reason that most of his stents had occluded given that he had very weak inflow at the time.  Now his bypass is occluded he does have severely depressed ABIs but he is minimally symptomatic without rest pain and only long distance nonlife limiting claudication.  This is likely due to his iliofemoral endarterectomy and retrograde stenting keeping his leg with enough blood flow to be minimally symptomatic.  He will continue aspirin and statin.  He will follow-up in 6 months.  Should he have issues we could consider angiography although he would have limited options.     Waynetta Sandy MD [facility]

## 2020-02-22 ENCOUNTER — Other Ambulatory Visit: Payer: Self-pay | Admitting: *Deleted

## 2020-02-22 DIAGNOSIS — I70221 Atherosclerosis of native arteries of extremities with rest pain, right leg: Secondary | ICD-10-CM

## 2020-02-22 DIAGNOSIS — I739 Peripheral vascular disease, unspecified: Secondary | ICD-10-CM

## 2020-03-10 NOTE — Progress Notes (Signed)
Name: Ryan [name]   MRN: [id number]    DOB: 07-18-1955   Date:03/14/2020       Progress Note  Subjective  Chief Complaint  Chief Complaint  Patient presents with   Hypertension   Follow-up    HPI  HTN:  No chest pain, dizziness  or palpitation . BP today is borderline and at home around 150 , we will add Diovan to regiment and recheck in 3 months, sooner for bp check only if needed   Atherosclerosis of Aorta: he is taking aspirin, and Plavix, off Eliquis, denies side effects of medication He also has ectasia of the ascending thoracic aorta, measuring 3.9 cm, he is going back for a CT chest this week   PAD:  Ryan [name] has a long history of PAD with claudication, he used to see Dr. Lucky Cowboy but asked for a second opinion . He was sent to Dr. [name] , ABI showed right 0.2 and left 1.01, stents  he right lower extremity were all occluded and he had surgery done on 09/14/2019   1.Harvest of right greater saphenous vein 2.Extensive Right external iliac, common femoral and profunda femoral endarterectomy with vein patch angioplasty 3.Right common femoral to anterior tibial artery bypass with composite 58mm ringed ptfe and reversed greater saphenous vein distally 4.Right common and external iliac artery stenting with 6 x 148mm Elluvia  He is on Plavix, his surgical is not completely healed, he has been using a topical spray that has helped with itching and burning but still has open areas on right lower leg, we will refer him to wound center   Alcoholism: he is still drinking beer most days but not as much, dow to 1-2 cans per day during the week, and 6 pack on the weekends he is back taking supplementation B1 and B12 . He denies paresthesia or gait problems.   Emphysema: he went to [facility] in June and was treated with a Zpack, he states Trelegy helps with his breathing, he states no wheezing, but has intermittent sob and would like a refill of Ventolin to use a few times a week. He  quit smoking 12/2018   History of iron deficiency anemia: improved on last labs  Patient Active Problem List   Diagnosis Date Noted   Ischemia of right lower extremity 01/28/2019   Iron deficiency anemia 11/23/2018   Stage 3 chronic kidney disease 10/21/2018   Atherosclerosis of native arteries of extremity with rest pain ([place]) 10/13/2018   PAD (peripheral artery disease) ([place]) 07/21/2018   Ischemic leg 06/25/2018   Claudication ([place]) 03/17/2018   B12 deficiency 03/12/2018   Vitamin B1 deficiency 03/12/2018   Varicose veins of leg with pain, right 03/12/2018   Enlarged thoracic aorta ([place]) 02/24/2018   Alcoholism /alcohol abuse ([place]) 02/24/2018   Paresthesia 02/24/2018   Ectatic aorta ([place]) 02/20/2018   Emphysema lung ([place]) 02/20/2018   Atherosclerosis of aorta ([place]) 02/20/2018   Encounter for tobacco use cessation counseling    Benign neoplasm of cecum    Benign neoplasm of transverse colon    Benign neoplasm of sigmoid colon    Hypertension, benign 08/31/2015   Tobacco use 08/31/2015    Past Surgical History:  Procedure Laterality Date   ABDOMINAL AORTOGRAM W/LOWER EXTREMITY N/A 09/13/2019   Procedure: ABDOMINAL AORTOGRAM W/LOWER EXTREMITY;  Surgeon: Waynetta Sandy, MD;  Location: [place] [facility];  Service: Cardiovascular;  Laterality: N/A;   COLONOSCOPY WITH PROPOFOL N/A 02/15/2016   Procedure: COLONOSCOPY WITH PROPOFOL;  Surgeon: Lucilla Lame, MD;  Location: [place];  Service: Endoscopy;  Laterality: N/A;   FEMORAL-POPLITEAL BYPASS GRAFT Right 09/14/2019   Procedure: BYPASS GRAFT FEMORAL-POPLITEAL ARTERY;  Surgeon: Waynetta Sandy, MD;  Location: [place];  Service: Vascular;  Laterality: Right;   HERNIA REPAIR  1999   left inguinal   INSERTION OF ILIAC STENT Right 09/14/2019   Procedure: Insertion Of Common and External Iliac Stent;  Surgeon: Waynetta Sandy, MD;  Location: [place];  Service: Vascular;   Laterality: Right;   LOWER EXTREMITY ANGIOGRAPHY Right 05/07/2018   Procedure: LOWER EXTREMITY ANGIOGRAPHY;  Surgeon: Algernon Huxley, MD;  Location: [place] [facility];  Service: Cardiovascular;  Laterality: Right;   LOWER EXTREMITY ANGIOGRAPHY Right 06/25/2018   Procedure: LOWER EXTREMITY ANGIOGRAPHY;  Surgeon: Algernon Huxley, MD;  Location: [place] [facility];  Service: Cardiovascular;  Laterality: Right;   LOWER EXTREMITY ANGIOGRAPHY Right 07/01/2018   Procedure: LOWER EXTREMITY ANGIOGRAPHY;  Surgeon: Algernon Huxley, MD;  Location: [place] [facility];  Service: Cardiovascular;  Laterality: Right;   LOWER EXTREMITY ANGIOGRAPHY Right 08/12/2018   Procedure: LOWER EXTREMITY ANGIOGRAPHY;  Surgeon: Algernon Huxley, MD;  Location: [place] [facility];  Service: Cardiovascular;  Laterality: Right;   LOWER EXTREMITY ANGIOGRAPHY Right 09/03/2018   Procedure: LOWER EXTREMITY ANGIOGRAPHY;  Surgeon: Algernon Huxley, MD;  Location: [place] [facility];  Service: Cardiovascular;  Laterality: Right;   LOWER EXTREMITY ANGIOGRAPHY Right 09/04/2018   Procedure: Lower Extremity Angiography;  Surgeon: Algernon Huxley, MD;  Location: [place] [facility];  Service: Cardiovascular;  Laterality: Right;   LOWER EXTREMITY ANGIOGRAPHY Right 10/01/2018   Procedure: LOWER EXTREMITY ANGIOGRAPHY;  Surgeon: Algernon Huxley, MD;  Location: [place] [facility];  Service: Cardiovascular;  Laterality: Right;   LOWER EXTREMITY ANGIOGRAPHY Right 10/01/2018   Procedure: Lower Extremity Angiography;  Surgeon: Algernon Huxley, MD;  Location: [place] [facility];  Service: Cardiovascular;  Laterality: Right;   LOWER EXTREMITY ANGIOGRAPHY Right 10/15/2018   Procedure: LOWER EXTREMITY ANGIOGRAPHY;  Surgeon: Algernon Huxley, MD;  Location: [place] [facility];  Service: Cardiovascular;  Laterality: Right;   LOWER EXTREMITY ANGIOGRAPHY Left 10/16/2018   Procedure: Lower Extremity Angiography;  Surgeon: Algernon Huxley, MD;  Location: [place] [facility];   Service: Cardiovascular;  Laterality: Left;   LOWER EXTREMITY ANGIOGRAPHY Left 01/20/2019   Procedure: LOWER EXTREMITY ANGIOGRAPHY;  Surgeon: Algernon Huxley, MD;  Location: [place] [facility];  Service: Cardiovascular;  Laterality: Left;   LOWER EXTREMITY ANGIOGRAPHY Right 01/21/2019   Procedure: Lower Extremity Angiography;  Surgeon: Algernon Huxley, MD;  Location: [place] [facility];  Service: Cardiovascular;  Laterality: Right;   LOWER EXTREMITY ANGIOGRAPHY Right 01/28/2019   Procedure: LOWER EXTREMITY ANGIOGRAPHY;  Surgeon: Algernon Huxley, MD;  Location: [place] [facility];  Service: Cardiovascular;  Laterality: Right;   LOWER EXTREMITY ANGIOGRAPHY Right 01/29/2019   Procedure: Lower Extremity Angiography;  Surgeon: Algernon Huxley, MD;  Location: [place] [facility];  Service: Cardiovascular;  Laterality: Right;   LOWER EXTREMITY INTERVENTION Right 07/02/2018   Procedure: LOWER EXTREMITY INTERVENTION;  Surgeon: Algernon Huxley, MD;  Location: [place] [facility];  Service: Cardiovascular;  Laterality: Right;   POLYPECTOMY N/A 02/15/2016   Procedure: POLYPECTOMY;  Surgeon: Lucilla Lame, MD;  Location: [place];  Service: Endoscopy;  Laterality: N/A;    Family History  Problem Relation Age of Onset   COPD Mother    Hypertension  Father    Heart attack Brother     Social History   Tobacco Use   Smoking status: Former Smoker    Packs/day: 0.50    Years: 40.00    Pack years: 20.00    Types: Cigarettes    Start date: 07/21/1978    Quit date: 12/2018    Years since quitting: 1.2   Smokeless tobacco: Never Used  Substance Use Topics   Alcohol use: Yes    Alcohol/week: 12.0 standard drinks    Types: 12 Cans of beer per week     Current Outpatient Medications:    aspirin EC 81 MG tablet, Take 1 tablet (81 mg total) by mouth daily., Disp: 90 tablet, Rfl: 3   atorvastatin (LIPITOR) 20 MG tablet, Take 1 tablet (20 mg total) by mouth daily., Disp: 90 tablet, Rfl: 1    clopidogrel (PLAVIX) 75 MG tablet, Take 75 mg by mouth daily., Disp: , Rfl:    Fluticasone-Umeclidin-Vilant (TRELEGY ELLIPTA) 100-62.5-25 MCG/INH AEPB, Inhale 1 puff into the lungs daily as needed (shortness of breath)., Disp: 60 each, Rfl: 5   thiamine (VITAMIN B-1) 50 MG tablet, Take 1 tablet (50 mg total) by mouth daily., Disp: 30 tablet, Rfl: 2   albuterol (VENTOLIN HFA) 108 (90 Base) MCG/ACT inhaler, Inhale 2 puffs into the lungs every 6 (six) hours as needed for wheezing or shortness of breath., Disp: 18 g, Rfl: 0   amLODipine-valsartan (EXFORGE) 5-160 MG tablet, Take 1 tablet by mouth daily. In place of amlodipine 5 mg, Disp: 90 tablet, Rfl: 0   Cyanocobalamin (B-12) 500 MCG SUBL, Place 1 tablet under the tongue daily., Disp: 30 tablet, Rfl: 0  No Known Allergies  I personally reviewed active problem list, medication list, allergies, family history, social history, health maintenance with the patient/caregiver today.   ROS  Constitutional: Negative for fever or weight change.  Respiratory: Negative for cough but has intermittent  shortness of breath.   Cardiovascular: Negative for chest pain or palpitations.  Gastrointestinal: Negative for abdominal pain, no bowel changes.  Musculoskeletal: positive  for gait problem but no  joint swelling.  Skin: wound right lower leg   Neurological: Negative for dizziness or headache.  No other specific complaints in a complete review of systems (except as listed in HPI above).  Objective  Vitals:   03/14/20 0822  BP: (!) 142/80  Pulse: 79  Resp: 14  Temp: 98.3 F (36.8 C)  TempSrc: Oral  SpO2: 97%  Weight: 149 lb 3.2 oz (67.7 kg)  Height: 5\' 8"  (1.727 m)    Body mass index is 22.69 kg/m.  Physical Exam  Constitutional: Patient appears well-developed and well-nourished.No distress.  HEENT: head atraumatic, normocephalic, pupils equal and reactive to light, neck supple, throat within normal limits Cardiovascular: Normal rate,  regular rhythm and normal heart sounds.  No murmur heard. right lower extremity edema 1 plus ankle ,no edema on left   Pulmonary/Chest: Effort normal but has rhonchi bilaterally. No respiratory distress. Skin: wound on right lower leg with some areas of healing but still has open areas with erythema  Abdominal: Soft.  There is no tenderness. Psychiatric: Patient has a normal mood and affect. behavior is normal. Judgment and thought content normal.  PHQ2/9: Depression screen [facility] 2/9 03/14/2020 11/12/2019 10/13/2019 06/29/2019 04/09/2019  Decreased Interest 0 0 0 0 0  Down, Depressed, Hopeless 0 0 0 0 0  PHQ - 2 Score 0 0 0 0 0  Altered sleeping - 0 0 0 0  Tired, decreased energy - 0 0 0 0  Change in appetite - 0 0 0 0  Feeling bad or failure about yourself  - 0 0 0 0  Trouble concentrating - 0 0 0 0  Moving slowly or fidgety/restless - 0 0 0 0  Suicidal thoughts - 0 0 0 0  PHQ-9 Score - 0 0 0 0  Difficult doing work/chores - Not difficult at all - Not difficult at all -  Some recent data might be hidden    phq 9 is negative  Fall Risk: Fall Risk  03/14/2020 11/12/2019 10/13/2019 06/29/2019 02/10/2019  Falls in the past year? 0 0 0 0 0  Number falls in past yr: 0 0 0 0 0  Injury with Fall? 0 0 0 0 0  Follow up - Falls evaluation completed - - -    Functional Status Survey: Is the patient deaf or have difficulty hearing?: No Does the patient have difficulty seeing, even when wearing glasses/contacts?: No Does the patient have difficulty concentrating, remembering, or making decisions?: No Does the patient have difficulty walking or climbing stairs?: No Does the patient have difficulty dressing or bathing?: No Does the patient have difficulty doing errands alone such as visiting a doctor's office or shopping?: No    Assessment & Plan  1. Hypertension, benign  - CBC with Differential/Platelet - COMPLETE METABOLIC PANEL WITH GFR - amLODipine-valsartan (EXFORGE) 5-160 MG tablet; Take 1  tablet by mouth daily. In place of amlodipine 5 mg  Dispense: 90 tablet; Refill: 0  2. PAD (peripheral artery disease) (HCC)  - atorvastatin (LIPITOR) 20 MG tablet; Take 1 tablet (20 mg total) by mouth daily.  Dispense: 90 tablet; Refill: 1 - Lipid panel  3. Need for immunization against influenza  - Flu Vaccine QUAD 36+ mos IM  4. Atherosclerosis of aorta (HCC)  - atorvastatin (LIPITOR) 20 MG tablet; Take 1 tablet (20 mg total) by mouth daily.  Dispense: 90 tablet; Refill: 1 - Lipid panel  5. Chronic obstructive pulmonary disease, unspecified COPD type ([place])  - Fluticasone-Umeclidin-Vilant (TRELEGY ELLIPTA) 100-62.5-25 MCG/INH AEPB; Inhale 1 puff into the lungs daily as needed (shortness of breath).  Dispense: 60 each; Refill: 5 - albuterol (VENTOLIN HFA) 108 (90 Base) MCG/ACT inhaler; Inhale 2 puffs into the lungs every 6 (six) hours as needed for wheezing or shortness of breath.  Dispense: 18 g; Refill: 0  6. Other emphysema ([place])   7. Enlarged thoracic aorta ([place])   8. Alcoholism /alcohol abuse ([place])  Advised to quit drinking again   9. History of femoropopliteal bypass   10. B12 deficiency  - Cyanocobalamin (B-12) 500 MCG SUBL; Place 1 tablet under the tongue daily.  Dispense: 30 tablet; Refill: 0 - Vitamin B12  11. Benign hypertension with chronic kidney disease, stage III  - amLODipine-valsartan (EXFORGE) 5-160 MG tablet; Take 1 tablet by mouth daily. In place of amlodipine 5 mg  Dispense: 90 tablet; Refill: 0  12. Stage 3a chronic kidney disease   13. Non-healing surgical wound, subsequent encounter  - Ambulatory referral to Wound Clinic  14. Vitamin B1 deficiency  - Vitamin B1

## 2020-03-14 ENCOUNTER — Ambulatory Visit (INDEPENDENT_AMBULATORY_CARE_PROVIDER_SITE_OTHER): Payer: 59 | Admitting: Family Medicine

## 2020-03-14 ENCOUNTER — Other Ambulatory Visit: Payer: Self-pay

## 2020-03-14 ENCOUNTER — Encounter: Payer: Self-pay | Admitting: Family Medicine

## 2020-03-14 VITALS — BP 142/80 | HR 79 | Temp 98.3°F | Resp 14 | Ht 68.0 in | Wt 149.2 lb

## 2020-03-14 DIAGNOSIS — I739 Peripheral vascular disease, unspecified: Secondary | ICD-10-CM | POA: Diagnosis not present

## 2020-03-14 DIAGNOSIS — IMO0001 Reserved for inherently not codable concepts without codable children: Secondary | ICD-10-CM

## 2020-03-14 DIAGNOSIS — E519 Thiamine deficiency, unspecified: Secondary | ICD-10-CM

## 2020-03-14 DIAGNOSIS — Z23 Encounter for immunization: Secondary | ICD-10-CM

## 2020-03-14 DIAGNOSIS — I700 Atherosclerosis of aorta: Secondary | ICD-10-CM

## 2020-03-14 DIAGNOSIS — Z9889 Other specified postprocedural states: Secondary | ICD-10-CM

## 2020-03-14 DIAGNOSIS — J438 Other emphysema: Secondary | ICD-10-CM

## 2020-03-14 DIAGNOSIS — I129 Hypertensive chronic kidney disease with stage 1 through stage 4 chronic kidney disease, or unspecified chronic kidney disease: Secondary | ICD-10-CM

## 2020-03-14 DIAGNOSIS — I7789 Other specified disorders of arteries and arterioles: Secondary | ICD-10-CM

## 2020-03-14 DIAGNOSIS — T8189XD Other complications of procedures, not elsewhere classified, subsequent encounter: Secondary | ICD-10-CM

## 2020-03-14 DIAGNOSIS — I10 Essential (primary) hypertension: Secondary | ICD-10-CM | POA: Diagnosis not present

## 2020-03-14 DIAGNOSIS — J449 Chronic obstructive pulmonary disease, unspecified: Secondary | ICD-10-CM

## 2020-03-14 DIAGNOSIS — F1020 Alcohol dependence, uncomplicated: Secondary | ICD-10-CM

## 2020-03-14 DIAGNOSIS — N1831 Chronic kidney disease, stage 3a: Secondary | ICD-10-CM

## 2020-03-14 DIAGNOSIS — E538 Deficiency of other specified B group vitamins: Secondary | ICD-10-CM

## 2020-03-14 DIAGNOSIS — N183 Chronic kidney disease, stage 3 unspecified: Secondary | ICD-10-CM

## 2020-03-14 MED ORDER — TRELEGY ELLIPTA 100-62.5-25 MCG/INH IN AEPB: 1.0000 | INHALATION_SPRAY | Freq: Every day | RESPIRATORY_TRACT | 5 refills | Status: DC | PRN

## 2020-03-14 MED ORDER — B-12 500 MCG SL SUBL: 1.0000 | SUBLINGUAL_TABLET | Freq: Every day | SUBLINGUAL | 0 refills | Status: DC

## 2020-03-14 MED ORDER — AMLODIPINE BESYLATE-VALSARTAN 5-160 MG PO TABS: 1.0000 | ORAL_TABLET | Freq: Every day | ORAL | 0 refills | Status: DC

## 2020-03-14 MED ORDER — ALBUTEROL SULFATE HFA 108 (90 BASE) MCG/ACT IN AERS: 2.0000 | INHALATION_SPRAY | Freq: Four times a day (QID) | RESPIRATORY_TRACT | 0 refills | Status: DC | PRN

## 2020-03-14 MED ORDER — ATORVASTATIN CALCIUM 20 MG PO TABS: 20.0000 mg | ORAL_TABLET | Freq: Every day | ORAL | 1 refills | Status: DC

## 2020-03-14 NOTE — Patient Instructions (Signed)
   Managing Your Hypertension Hypertension is commonly called high blood pressure. This is when the force of your blood pressing against the walls of your arteries is too strong. Arteries are blood vessels that carry blood from your heart throughout your body. Hypertension forces the heart to work harder to pump blood, and may cause the arteries to become narrow or stiff. Having untreated or uncontrolled hypertension can cause heart attack, stroke, kidney disease, and other problems. What are blood pressure readings? A blood pressure reading consists of a higher number over a lower number. Ideally, your blood pressure should be below 120/80. The first ("top") number is called the systolic pressure. It is a measure of the pressure in your arteries as your heart beats. The second ("bottom") number is called the diastolic pressure. It is a measure of the pressure in your arteries as the heart relaxes. What does my blood pressure reading mean? Blood pressure is classified into four stages. Based on your blood pressure reading, your health care provider may use the following stages to determine what type of treatment you need, if any. Systolic pressure and diastolic pressure are measured in a unit called mm Hg. Normal  Systolic pressure: below 120.  Diastolic pressure: below 80. Elevated  Systolic pressure: 120-129.  Diastolic pressure: below 80. Hypertension stage 1  Systolic pressure: 130-139.  Diastolic pressure: 80-89. Hypertension stage 2  Systolic pressure: 140 or above.  Diastolic pressure: 90 or above. What health risks are associated with hypertension? Managing your hypertension is an important responsibility. Uncontrolled hypertension can lead to:  A heart attack.  A stroke.  A weakened blood vessel (aneurysm).  Heart failure.  Kidney damage.  Eye damage.  Metabolic syndrome.  Memory and concentration problems. What changes can I make to manage my  hypertension? Hypertension can be managed by making lifestyle changes and possibly by taking medicines. Your health care provider will help you make a plan to bring your blood pressure within a normal range. Eating and drinking   Eat a diet that is high in fiber and potassium, and low in salt (sodium), added sugar, and fat. An example eating plan is called the DASH (Dietary Approaches to Stop Hypertension) diet. To eat this way: ? Eat plenty of fresh fruits and vegetables. Try to fill half of your plate at each meal with fruits and vegetables. ? Eat whole grains, such as whole wheat pasta, brown rice, or whole grain bread. Fill about one quarter of your plate with whole grains. ? Eat low-fat diary products. ? Avoid fatty cuts of meat, processed or cured meats, and poultry with skin. Fill about one quarter of your plate with lean proteins such as fish, chicken without skin, beans, eggs, and tofu. ? Avoid premade and processed foods. These tend to be higher in sodium, added sugar, and fat.  Reduce your daily sodium intake. Most people with hypertension should eat less than 1,500 mg of sodium a day.  Limit alcohol intake to no more than 1 drink a day for nonpregnant women and 2 drinks a day for men. One drink equals 12 oz of beer, 5 oz of wine, or 1 oz of hard liquor. Lifestyle  Work with your health care provider to maintain a healthy body weight, or to lose weight. Ask what an ideal weight is for you.  Get at least 30 minutes of exercise that causes your heart to beat faster (aerobic exercise) most days of the week. Activities may include walking, swimming, or biking.    Include exercise to strengthen your muscles (resistance exercise), such as weight lifting, as part of your weekly exercise routine. Try to do these types of exercises for 30 minutes at least 3 days a week.  Do not use any products that contain nicotine or tobacco, such as cigarettes and e-cigarettes. If you need help quitting,  ask your health care provider.  Control any long-term (chronic) conditions you have, such as high cholesterol or diabetes. Monitoring  Monitor your blood pressure at home as told by your health care provider. Your personal target blood pressure may vary depending on your medical conditions, your age, and other factors.  Have your blood pressure checked regularly, as often as told by your health care provider. Working with your health care provider  Review all the medicines you take with your health care provider because there may be side effects or interactions.  Talk with your health care provider about your diet, exercise habits, and other lifestyle factors that may be contributing to hypertension.  Visit your health care provider regularly. Your health care provider can help you create and adjust your plan for managing hypertension. Will I need medicine to control my blood pressure? Your health care provider may prescribe medicine if lifestyle changes are not enough to get your blood pressure under control, and if:  Your systolic blood pressure is 130 or higher.  Your diastolic blood pressure is 80 or higher. Take medicines only as told by your health care provider. Follow the directions carefully. Blood pressure medicines must be taken as prescribed. The medicine does not work as well when you skip doses. Skipping doses also puts you at risk for problems. Contact a health care provider if:  You think you are having a reaction to medicines you have taken.  You have repeated (recurrent) headaches.  You feel dizzy.  You have swelling in your ankles.  You have trouble with your vision. Get help right away if:  You develop a severe headache or confusion.  You have unusual weakness or numbness, or you feel faint.  You have severe pain in your chest or abdomen.  You vomit repeatedly.  You have trouble breathing. Summary  Hypertension is when the force of blood pumping  through your arteries is too strong. If this condition is not controlled, it may put you at risk for serious complications.  Your personal target blood pressure may vary depending on your medical conditions, your age, and other factors. For most people, a normal blood pressure is less than 120/80.  Hypertension is managed by lifestyle changes, medicines, or both. Lifestyle changes include weight loss, eating a healthy, low-sodium diet, exercising more, and limiting alcohol. This information is not intended to replace advice given to you by your health care provider. Make sure you discuss any questions you have with your health care provider. Document Revised: 10/02/2018 Document Reviewed: 05/08/2016 Elsevier Patient Education  2020 [employer]  

## 2020-03-16 ENCOUNTER — Ambulatory Visit
Admission: RE | Admit: 2020-03-16 | Discharge: 2020-03-16 | Disposition: A | Discharge status: Home / Self Care | Payer: 59 | Source: Ambulatory Visit | Attending: Nurse Practitioner | Admitting: Nurse Practitioner

## 2020-03-16 ENCOUNTER — Other Ambulatory Visit: Payer: Self-pay

## 2020-03-16 DIAGNOSIS — Z87891 Personal history of nicotine dependence: Secondary | ICD-10-CM | POA: Diagnosis not present

## 2020-03-16 DIAGNOSIS — Z122 Encounter for screening for malignant neoplasm of respiratory organs: Secondary | ICD-10-CM

## 2020-03-19 ENCOUNTER — Encounter: Payer: Self-pay | Admitting: *Deleted

## 2020-03-21 LAB — COMPLETE METABOLIC PANEL WITH GFR
AG Ratio: 1.4 (calc) (ref 1.0–2.5)
ALT: 10 U/L (ref 9–46)
AST: 13 U/L (ref 10–35)
Albumin: 4.3 g/dL (ref 3.6–5.1)
Alkaline phosphatase (APISO): 90 U/L (ref 35–144)
BUN/Creatinine Ratio: 13 (calc) (ref 6–22)
BUN: 20 mg/dL (ref 7–25)
CO2: 24 mmol/L (ref 20–32)
Calcium: 9.3 mg/dL (ref 8.6–10.3)
Chloride: 106 mmol/L (ref 98–110)
Creat: 1.51 mg/dL — ABNORMAL HIGH (ref 0.70–1.25)
GFR, Est African American: 57 mL/min/{1.73_m2} — ABNORMAL LOW (ref >=60)
GFR, Est Non African American: 49 mL/min/{1.73_m2} — ABNORMAL LOW (ref >=60)
Globulin: 3.1 g/dL (calc) (ref 1.9–3.7)
Glucose, Bld: 74 mg/dL (ref 65–99)
Potassium: 4.2 mmol/L (ref 3.5–5.3)
Sodium: 140 mmol/L (ref 135–146)
Total Bilirubin: 0.5 mg/dL (ref 0.2–1.2)
Total Protein: 7.4 g/dL (ref 6.1–8.1)

## 2020-03-21 LAB — CBC WITH DIFFERENTIAL/PLATELET
Absolute Monocytes: 435 cells/uL (ref 200–950)
Basophils Absolute: 42 cells/uL (ref 0–200)
Basophils Relative: 0.8 %
Eosinophils Absolute: 180 cells/uL (ref 15–500)
Eosinophils Relative: 3.4 %
HCT: 39.2 % (ref 38.5–50.0)
Hemoglobin: 13 g/dL — ABNORMAL LOW (ref 13.2–17.1)
Lymphs Abs: 1076 cells/uL (ref 850–3900)
MCH: 31.2 pg (ref 27.0–33.0)
MCHC: 33.2 g/dL (ref 32.0–36.0)
MCV: 94 fL (ref 80.0–100.0)
MPV: 10.2 fL (ref 7.5–12.5)
Monocytes Relative: 8.2 %
Neutro Abs: 3567 cells/uL (ref 1500–7800)
Neutrophils Relative %: 67.3 %
Platelets: 263 10*3/uL (ref 140–400)
RBC: 4.17 10*6/uL — ABNORMAL LOW (ref 4.20–5.80)
RDW: 12.5 % (ref 11.0–15.0)
Total Lymphocyte: 20.3 %
WBC: 5.3 10*3/uL (ref 3.8–10.8)

## 2020-03-21 LAB — LIPID PANEL
Cholesterol: 138 mg/dL (ref <200)
HDL: 73 mg/dL (ref >=40)
LDL Cholesterol (Calc): 51 mg/dL (calc)
Non-HDL Cholesterol (Calc): 65 mg/dL (calc) (ref <130)
Total CHOL/HDL Ratio: 1.9 (calc) (ref <5.0)
Triglycerides: 60 mg/dL (ref <150)

## 2020-03-21 LAB — VITAMIN B1: Vitamin B1 (Thiamine): 16 nmol/L (ref 8–30)

## 2020-03-21 LAB — VITAMIN B12: Vitamin B-12: >2000 pg/mL — ABNORMAL HIGH (ref 200–1100)

## 2020-03-28 ENCOUNTER — Ambulatory Visit: Payer: 59

## 2020-03-28 ENCOUNTER — Other Ambulatory Visit: Payer: Self-pay

## 2020-03-30 ENCOUNTER — Telehealth: Payer: Self-pay

## 2020-03-30 NOTE — Telephone Encounter (Signed)
Patient called requesting appointment because "leg don't look right" Very concerned about leg wound and wanted someone to look at it. Scheduled patient for f/u wound check appointment with PA on 03/31/20.

## 2020-03-31 ENCOUNTER — Other Ambulatory Visit: Payer: Self-pay

## 2020-03-31 ENCOUNTER — Ambulatory Visit (INDEPENDENT_AMBULATORY_CARE_PROVIDER_SITE_OTHER): Payer: 59 | Admitting: Physician Assistant

## 2020-03-31 VITALS — BP 159/104 | HR 84 | Temp 98.1°F | Resp 20 | Ht 68.0 in | Wt 146.8 lb

## 2020-03-31 DIAGNOSIS — T8189XD Other complications of procedures, not elsewhere classified, subsequent encounter: Secondary | ICD-10-CM | POA: Diagnosis not present

## 2020-03-31 DIAGNOSIS — I739 Peripheral vascular disease, unspecified: Secondary | ICD-10-CM

## 2020-03-31 MED ORDER — CEPHALEXIN 500 MG PO CAPS: 500.0000 mg | ORAL_CAPSULE | Freq: Four times a day (QID) | ORAL | 0 refills | Status: DC

## 2020-03-31 NOTE — Progress Notes (Signed)
[facility] HISTORY AND PHYSICAL   History of Present Illness:  Ryan [name] is a 64 y.o. male status post right common and external iliac artery stenting with iliofemoral endarterectomy and femoral to anterior tibial artery bypass graft back in March.  This was after multiple endovascular procedures had failed.  He has known occluded graft since May.  States that he has returned to work.  He denies any rest pain in his right foot.  He does have some nonhealing wound of the distal right leg also does have long distance claudication at this time is not life limiting.  He is on aspirin and a statin.  The wound has progressed and his right foot feels cold to him.  He denise fever and chills.  He states his right lower leg itches constantly and he has edema in the foot.    Past Medical History:  Diagnosis Date  . Hyperlipidemia   . Hypertension   . Neuromuscular disorder (HCC)    numbness feet  . Peripheral vascular disease ([place])   . Shortness of breath dyspnea     Past Surgical History:  Procedure Laterality Date  . ABDOMINAL AORTOGRAM W/LOWER EXTREMITY N/A 09/13/2019   Procedure: ABDOMINAL AORTOGRAM W/LOWER EXTREMITY;  Surgeon: Waynetta Sandy, MD;  Location: [facility];  Service: Cardiovascular;  Laterality: N/A;  . COLONOSCOPY WITH PROPOFOL N/A 02/15/2016   Procedure: COLONOSCOPY WITH PROPOFOL;  Surgeon: Lucilla Lame, MD;  Location: [facility];  Service: Endoscopy;  Laterality: N/A;  . FEMORAL-POPLITEAL BYPASS GRAFT Right 09/14/2019   Procedure: BYPASS GRAFT FEMORAL-POPLITEAL ARTERY;  Surgeon: Waynetta Sandy, MD;  Location: [place] [facility];  Service: Vascular;  Laterality: Right;  . HERNIA REPAIR  1999   left inguinal  . INSERTION OF ILIAC STENT Right 09/14/2019   Procedure: Insertion Of Common and External Iliac Stent;  Surgeon: Waynetta Sandy, MD;  Location: [place];  Service: Vascular;  Laterality: Right;  . LOWER  EXTREMITY ANGIOGRAPHY Right 05/07/2018   Procedure: LOWER EXTREMITY ANGIOGRAPHY;  Surgeon: Algernon Huxley, MD;  Location: [place];  Service: Cardiovascular;  Laterality: Right;  . LOWER EXTREMITY ANGIOGRAPHY Right 06/25/2018   Procedure: LOWER EXTREMITY ANGIOGRAPHY;  Surgeon: Algernon Huxley, MD;  Location: [place];  Service: Cardiovascular;  Laterality: Right;  . LOWER EXTREMITY ANGIOGRAPHY Right 07/01/2018   Procedure: LOWER EXTREMITY ANGIOGRAPHY;  Surgeon: Algernon Huxley, MD;  Location: [place] [facility];  Service: Cardiovascular;  Laterality: Right;  . LOWER EXTREMITY ANGIOGRAPHY Right 08/12/2018   Procedure: LOWER EXTREMITY ANGIOGRAPHY;  Surgeon: Algernon Huxley, MD;  Location: [place] [facility];  Service: Cardiovascular;  Laterality: Right;  . LOWER EXTREMITY ANGIOGRAPHY Right 09/03/2018   Procedure: LOWER EXTREMITY ANGIOGRAPHY;  Surgeon: Algernon Huxley, MD;  Location: [facility] [facility];  Service: Cardiovascular;  Laterality: Right;  . LOWER EXTREMITY ANGIOGRAPHY Right 09/04/2018   Procedure: Lower Extremity Angiography;  Surgeon: Algernon Huxley, MD;  Location: [place] [facility];  Service: Cardiovascular;  Laterality: Right;  . LOWER EXTREMITY ANGIOGRAPHY Right 10/01/2018   Procedure: LOWER EXTREMITY ANGIOGRAPHY;  Surgeon: Algernon Huxley, MD;  Location: [place] [facility];  Service: Cardiovascular;  Laterality: Right;  . LOWER EXTREMITY ANGIOGRAPHY Right 10/01/2018   Procedure: Lower Extremity Angiography;  Surgeon: Algernon Huxley, MD;  Location: [place] [facility];  Service: Cardiovascular;  Laterality: Right;  . LOWER EXTREMITY ANGIOGRAPHY Right 10/15/2018   Procedure: LOWER EXTREMITY ANGIOGRAPHY;  Surgeon: Algernon Huxley, MD;  Location: [place];  Service: Cardiovascular;  Laterality: Right;  . LOWER EXTREMITY ANGIOGRAPHY Left 10/16/2018   Procedure: Lower Extremity Angiography;  Surgeon: Algernon Huxley, MD;  Location: [facility];  Service: Cardiovascular;   Laterality: Left;  . LOWER EXTREMITY ANGIOGRAPHY Left 01/20/2019   Procedure: LOWER EXTREMITY ANGIOGRAPHY;  Surgeon: Algernon Huxley, MD;  Location: [place];  Service: Cardiovascular;  Laterality: Left;  . LOWER EXTREMITY ANGIOGRAPHY Right 01/21/2019   Procedure: Lower Extremity Angiography;  Surgeon: Algernon Huxley, MD;  Location: [facility];  Service: Cardiovascular;  Laterality: Right;  . LOWER EXTREMITY ANGIOGRAPHY Right 01/28/2019   Procedure: LOWER EXTREMITY ANGIOGRAPHY;  Surgeon: Algernon Huxley, MD;  Location: [place];  Service: Cardiovascular;  Laterality: Right;  . LOWER EXTREMITY ANGIOGRAPHY Right 01/29/2019   Procedure: Lower Extremity Angiography;  Surgeon: Algernon Huxley, MD;  Location: [place] [facility];  Service: Cardiovascular;  Laterality: Right;  . LOWER EXTREMITY INTERVENTION Right 07/02/2018   Procedure: LOWER EXTREMITY INTERVENTION;  Surgeon: Algernon Huxley, MD;  Location: [place] [facility];  Service: Cardiovascular;  Laterality: Right;  . POLYPECTOMY N/A 02/15/2016   Procedure: POLYPECTOMY;  Surgeon: Lucilla Lame, MD;  Location: [facility];  Service: Endoscopy;  Laterality: N/A;    ROS:   General:  No weight loss, Fever, chills  HEENT: No recent headaches, no nasal bleeding, no visual changes, no sore throat  Neurologic: No dizziness, blackouts, seizures. No recent symptoms of stroke or mini- stroke. No recent episodes of slurred speech, or temporary blindness.  Cardiac: No recent episodes of chest pain/pressure, no shortness of breath at rest.  No shortness of breath with exertion.  Denies history of atrial fibrillation or irregular heartbeat  Vascular: No history of rest pain in feet.  No history of claudication.  positive history of non-healing ulcer, No history of DVT   Pulmonary: No home oxygen, no productive cough, no hemoptysis,  No asthma or wheezing  Musculoskeletal:  [ ]  Arthritis, [ ]  Low back pain,  [ ]  Joint  pain  Hematologic:No history of hypercoagulable state.  No history of easy bleeding.  No history of anemia  Gastrointestinal: No hematochezia or melena,  No gastroesophageal reflux, no trouble swallowing  Urinary: [ ]  chronic Kidney disease, [ ]  on HD - [ ]  MWF or [ ]  TTHS, [ ]  Burning with urination, [ ]  Frequent urination, [ ]  Difficulty urinating;   Skin: No rashes open wound right LE  Psychological: No history of anxiety,  No history of depression  Social History Social History   Tobacco Use  . Smoking status: Former Smoker    Packs/day: 0.50    Years: 40.00    Pack years: 20.00    Types: Cigarettes    Start date: 07/21/1978    Quit date: 12/2018    Years since quitting: 1.2  . Smokeless tobacco: Never Used  Vaping Use  . Vaping Use: Never used  Substance Use Topics  . Alcohol use: Yes    Alcohol/week: 12.0 standard drinks    Types: 12 Cans of beer per week  . Drug use: Never    Family History Family History  Problem Relation Age of Onset  . COPD Mother   . Hypertension Father   . Heart attack Brother     Allergies  No Known Allergies   Current Outpatient Medications  Medication Sig Dispense Refill  . albuterol (VENTOLIN  HFA) 108 (90 Base) MCG/ACT inhaler Inhale 2 puffs into the lungs every 6 (six) hours as needed for wheezing or shortness of breath. 18 g 0  . amLODipine-valsartan (EXFORGE) 5-160 MG tablet Take 1 tablet by mouth daily. In place of amlodipine 5 mg 90 tablet 0  . aspirin EC 81 MG tablet Take 1 tablet (81 mg total) by mouth daily. 90 tablet 3  . atorvastatin (LIPITOR) 20 MG tablet Take 1 tablet (20 mg total) by mouth daily. 90 tablet 1  . clopidogrel (PLAVIX) 75 MG tablet Take 75 mg by mouth daily.    . Cyanocobalamin (B-12) 500 MCG SUBL Place 1 tablet under the tongue daily. 30 tablet 0  . Fluticasone-Umeclidin-Vilant (TRELEGY ELLIPTA) 100-62.5-25 MCG/INH AEPB Inhale 1 puff into the lungs daily as needed (shortness of breath). 60 each 5  .  thiamine (VITAMIN B-1) 50 MG tablet Take 1 tablet (50 mg total) by mouth daily. 30 tablet 2  . cephALEXin (KEFLEX) 500 MG capsule Take 1 capsule (500 mg total) by mouth 4 (four) times daily. 40 capsule 0   No current facility-administered medications for this visit.    Physical Examination  Vitals:   03/31/20 1529  BP: (!) 159/104  Pulse: 84  Resp: 20  Temp: 98.1 F (36.7 C)  TempSrc: Temporal  SpO2: 98%  Weight: 146 lb 12.8 oz (66.6 kg)  Height: 5\' 8"  (1.727 m)    Body mass index is 22.32 kg/m.  General:  Alert and oriented, no acute distress HEENT: Normal Neck: No bruit or JVD Pulmonary: Clear to auscultation bilaterally Cardiac: Regular Rate and Rhythm without murmur Abdomen: Soft, non-tender, non-distended, no mass, no scars Skin: No rash Extremity Pulses: right doppler signal in the right LE bypass at the thigh level and medial with DP doppler signal. Musculoskeletal: No deformity or edema  Neurologic: Upper and lower extremity motor 5/5 and symmetric           ASSESSMENT:  PAD after multiple interventions. He presented with  occluded stents and we planned for right femoral to anterior tibial artery bypass which was performed with composite graft and vein.  He also had an iliofemoral endarterectomy with retrograde common external iliac artery stenting.  This was likely the reason that most of his stents had occluded given that he had very weak inflow at the time.  Now his bypass is occluded.  He has doppler flow right lateral thigh, medial BK, and DP.  His foot is warm and well perfused.  The wound appears infected without malodor.  He has been scatching the area.    PLAN:   I will start him on wet to dry dressing changes BID and sent Keflex 500 mg QID x 10 days to his pharmacy.  Recommend not scratching the skin, elevation when at rest. I have scheduled him for angiogram with possible intervention with Dr. [name].     Roxy Horseman PA-C [facility] Office: [phone]  MD on call [name]

## 2020-03-31 NOTE — H&P (View-Only) (Signed)
[facility] [place] HISTORY AND PHYSICAL   History of Present Illness:  Ryan [name] is a 64 y.o. male status post right common and external iliac artery stenting with iliofemoral endarterectomy and femoral to anterior tibial artery bypass graft back in March.  This was after multiple endovascular procedures had failed.  He has known occluded graft since May.  States that he has returned to work.  He denies any rest pain in his right foot.  He does have some nonhealing wound of the distal right leg also does have long distance claudication at this time is not life limiting.  He is on aspirin and a statin.  The wound has progressed and his right foot feels cold to him.  He denise fever and chills.  He states his right lower leg itches constantly and he has edema in the foot.    Past Medical History:  Diagnosis Date  . Hyperlipidemia   . Hypertension   . Neuromuscular disorder (HCC)    numbness feet  . Peripheral vascular disease ([place])   . Shortness of breath dyspnea     Past Surgical History:  Procedure Laterality Date  . ABDOMINAL AORTOGRAM W/LOWER EXTREMITY N/A 09/13/2019   Procedure: ABDOMINAL AORTOGRAM W/LOWER EXTREMITY;  Surgeon: Waynetta Sandy, MD;  Location: [facility];  Service: Cardiovascular;  Laterality: N/A;  . COLONOSCOPY WITH PROPOFOL N/A 02/15/2016   Procedure: COLONOSCOPY WITH PROPOFOL;  Surgeon: Lucilla Lame, MD;  Location: [facility];  Service: Endoscopy;  Laterality: N/A;  . FEMORAL-POPLITEAL BYPASS GRAFT Right 09/14/2019   Procedure: BYPASS GRAFT FEMORAL-POPLITEAL ARTERY;  Surgeon: Waynetta Sandy, MD;  Location: [facility];  Service: Vascular;  Laterality: Right;  . HERNIA REPAIR  1999   left inguinal  . INSERTION OF ILIAC STENT Right 09/14/2019   Procedure: Insertion Of Common and External Iliac Stent;  Surgeon: Waynetta Sandy, MD;  Location: [facility];  Service: Vascular;  Laterality: Right;  . LOWER  EXTREMITY ANGIOGRAPHY Right 05/07/2018   Procedure: LOWER EXTREMITY ANGIOGRAPHY;  Surgeon: Algernon Huxley, MD;  Location: [facility];  Service: Cardiovascular;  Laterality: Right;  . LOWER EXTREMITY ANGIOGRAPHY Right 06/25/2018   Procedure: LOWER EXTREMITY ANGIOGRAPHY;  Surgeon: Algernon Huxley, MD;  Location: [facility];  Service: Cardiovascular;  Laterality: Right;  . LOWER EXTREMITY ANGIOGRAPHY Right 07/01/2018   Procedure: LOWER EXTREMITY ANGIOGRAPHY;  Surgeon: Algernon Huxley, MD;  Location: [facility];  Service: Cardiovascular;  Laterality: Right;  . LOWER EXTREMITY ANGIOGRAPHY Right 08/12/2018   Procedure: LOWER EXTREMITY ANGIOGRAPHY;  Surgeon: Algernon Huxley, MD;  Location: [facility];  Service: Cardiovascular;  Laterality: Right;  . LOWER EXTREMITY ANGIOGRAPHY Right 09/03/2018   Procedure: LOWER EXTREMITY ANGIOGRAPHY;  Surgeon: Algernon Huxley, MD;  Location: [facility];  Service: Cardiovascular;  Laterality: Right;  . LOWER EXTREMITY ANGIOGRAPHY Right 09/04/2018   Procedure: Lower Extremity Angiography;  Surgeon: Algernon Huxley, MD;  Location: [facility];  Service: Cardiovascular;  Laterality: Right;  . LOWER EXTREMITY ANGIOGRAPHY Right 10/01/2018   Procedure: LOWER EXTREMITY ANGIOGRAPHY;  Surgeon: Algernon Huxley, MD;  Location: [facility];  Service: Cardiovascular;  Laterality: Right;  . LOWER EXTREMITY ANGIOGRAPHY Right 10/01/2018   Procedure: Lower Extremity Angiography;  Surgeon: Algernon Huxley, MD;  Location: [facility];  Service: Cardiovascular;  Laterality: Right;  . LOWER EXTREMITY ANGIOGRAPHY Right 10/15/2018   Procedure: LOWER EXTREMITY ANGIOGRAPHY;  Surgeon: Algernon Huxley, MD;  Location: [facility];  Service: Cardiovascular;  Laterality: Right;  . LOWER EXTREMITY ANGIOGRAPHY Left 10/16/2018   Procedure: Lower Extremity Angiography;  Surgeon: Algernon Huxley, MD;  Location: [facility];  Service: Cardiovascular;   Laterality: Left;  . LOWER EXTREMITY ANGIOGRAPHY Left 01/20/2019   Procedure: LOWER EXTREMITY ANGIOGRAPHY;  Surgeon: Algernon Huxley, MD;  Location: [facility];  Service: Cardiovascular;  Laterality: Left;  . LOWER EXTREMITY ANGIOGRAPHY Right 01/21/2019   Procedure: Lower Extremity Angiography;  Surgeon: Algernon Huxley, MD;  Location: [facility];  Service: Cardiovascular;  Laterality: Right;  . LOWER EXTREMITY ANGIOGRAPHY Right 01/28/2019   Procedure: LOWER EXTREMITY ANGIOGRAPHY;  Surgeon: Algernon Huxley, MD;  Location: [facility];  Service: Cardiovascular;  Laterality: Right;  . LOWER EXTREMITY ANGIOGRAPHY Right 01/29/2019   Procedure: Lower Extremity Angiography;  Surgeon: Algernon Huxley, MD;  Location: [facility];  Service: Cardiovascular;  Laterality: Right;  . LOWER EXTREMITY INTERVENTION Right 07/02/2018   Procedure: LOWER EXTREMITY INTERVENTION;  Surgeon: Algernon Huxley, MD;  Location: [facility];  Service: Cardiovascular;  Laterality: Right;  . POLYPECTOMY N/A 02/15/2016   Procedure: POLYPECTOMY;  Surgeon: Lucilla Lame, MD;  Location: [facility];  Service: Endoscopy;  Laterality: N/A;    ROS:   General:  No weight loss, Fever, chills  HEENT: No recent headaches, no nasal bleeding, no visual changes, no sore throat  Neurologic: No dizziness, blackouts, seizures. No recent symptoms of stroke or mini- stroke. No recent episodes of slurred speech, or temporary blindness.  Cardiac: No recent episodes of chest pain/pressure, no shortness of breath at rest.  No shortness of breath with exertion.  Denies history of atrial fibrillation or irregular heartbeat  Vascular: No history of rest pain in feet.  No history of claudication.  positive history of non-healing ulcer, No history of DVT   Pulmonary: No home oxygen, no productive cough, no hemoptysis,  No asthma or wheezing  Musculoskeletal:  [ ]  Arthritis, [ ]  Low back pain,  [ ]  Joint  pain  Hematologic:No history of hypercoagulable state.  No history of easy bleeding.  No history of anemia  Gastrointestinal: No hematochezia or melena,  No gastroesophageal reflux, no trouble swallowing  Urinary: [ ]  chronic Kidney disease, [ ]  on HD - [ ]  MWF or [ ]  TTHS, [ ]  Burning with urination, [ ]  Frequent urination, [ ]  Difficulty urinating;   Skin: No rashes open wound right LE  Psychological: No history of anxiety,  No history of depression  Social History Social History   Tobacco Use  . Smoking status: Former Smoker    Packs/day: 0.50    Years: 40.00    Pack years: 20.00    Types: Cigarettes    Start date: 07/21/1978    Quit date: 12/2018    Years since quitting: 1.2  . Smokeless tobacco: Never Used  Vaping Use  . Vaping Use: Never used  Substance Use Topics  . Alcohol use: Yes    Alcohol/week: 12.0 standard drinks    Types: 12 Cans of beer per week  . Drug use: Never    Family History Family History  Problem Relation Age of Onset  . COPD Mother   . Hypertension Father   . Heart attack Brother     Allergies  No Known Allergies   Current Outpatient Medications  Medication Sig Dispense Refill  . albuterol (VENTOLIN  HFA) 108 (90 Base) MCG/ACT inhaler Inhale 2 puffs into the lungs every 6 (six) hours as needed for wheezing or shortness of breath. 18 g 0  . amLODipine-valsartan (EXFORGE) 5-160 MG tablet Take 1 tablet by mouth daily. In place of amlodipine 5 mg 90 tablet 0  . aspirin EC 81 MG tablet Take 1 tablet (81 mg total) by mouth daily. 90 tablet 3  . atorvastatin (LIPITOR) 20 MG tablet Take 1 tablet (20 mg total) by mouth daily. 90 tablet 1  . clopidogrel (PLAVIX) 75 MG tablet Take 75 mg by mouth daily.    . Cyanocobalamin (B-12) 500 MCG SUBL Place 1 tablet under the tongue daily. 30 tablet 0  . Fluticasone-Umeclidin-Vilant (TRELEGY ELLIPTA) 100-62.5-25 MCG/INH AEPB Inhale 1 puff into the lungs daily as needed (shortness of breath). 60 each 5  .  thiamine (VITAMIN B-1) 50 MG tablet Take 1 tablet (50 mg total) by mouth daily. 30 tablet 2  . cephALEXin (KEFLEX) 500 MG capsule Take 1 capsule (500 mg total) by mouth 4 (four) times daily. 40 capsule 0   No current facility-administered medications for this visit.    Physical Examination  Vitals:   03/31/20 1529  BP: (!) 159/104  Pulse: 84  Resp: 20  Temp: 98.1 F (36.7 C)  TempSrc: Temporal  SpO2: 98%  Weight: 146 lb 12.8 oz (66.6 kg)  Height: 5\' 8"  (1.727 m)    Body mass index is 22.32 kg/m.  General:  Alert and oriented, no acute distress HEENT: Normal Neck: No bruit or JVD Pulmonary: Clear to auscultation bilaterally Cardiac: Regular Rate and Rhythm without murmur Abdomen: Soft, non-tender, non-distended, no mass, no scars Skin: No rash Extremity Pulses: right doppler signal in the right LE bypass at the thigh level and medial with DP doppler signal. Musculoskeletal: No deformity or edema  Neurologic: Upper and lower extremity motor 5/5 and symmetric           ASSESSMENT:  PAD after multiple interventions. He presented with  occluded stents and we planned for right femoral to anterior tibial artery bypass which was performed with composite graft and vein.  He also had an iliofemoral endarterectomy with retrograde common external iliac artery stenting.  This was likely the reason that most of his stents had occluded given that he had very weak inflow at the time.  Now his bypass is occluded.  He has doppler flow right lateral thigh, medial BK, and DP.  His foot is warm and well perfused.  The wound appears infected without malodor.  He has been scatching the area.    PLAN:   I will start him on wet to dry dressing changes BID and sent Keflex 500 mg QID x 10 days to his pharmacy.  Recommend not scratching the skin, elevation when at rest. I have scheduled him for angiogram with possible intervention with Dr. [name].     Roxy Horseman PA-C [facility] [place] Office: [phone]  MD on call [name]

## 2020-04-01 ENCOUNTER — Other Ambulatory Visit (HOSPITAL_COMMUNITY)
Admission: RE | Admit: 2020-04-01 | Discharge: 2020-04-01 | Disposition: A | Discharge status: Home / Self Care | Payer: 59 | Source: Ambulatory Visit | Attending: Vascular Surgery | Admitting: Vascular Surgery

## 2020-04-01 DIAGNOSIS — Z20822 Contact with and (suspected) exposure to covid-19: Secondary | ICD-10-CM | POA: Diagnosis not present

## 2020-04-01 DIAGNOSIS — Z01812 Encounter for preprocedural laboratory examination: Secondary | ICD-10-CM | POA: Diagnosis present

## 2020-04-01 LAB — SARS CORONAVIRUS 2 (TAT 6-24 HRS): SARS Coronavirus 2: NEGATIVE

## 2020-04-04 ENCOUNTER — Ambulatory Visit (HOSPITAL_COMMUNITY)
Admission: RE | Disposition: A | Discharge status: Home / Self Care | Payer: Self-pay | Source: Home / Self Care | Attending: Vascular Surgery

## 2020-04-04 ENCOUNTER — Other Ambulatory Visit: Payer: Self-pay

## 2020-04-04 ENCOUNTER — Ambulatory Visit (HOSPITAL_COMMUNITY)
Admission: RE | Admit: 2020-04-04 | Discharge: 2020-04-04 | Disposition: A | Discharge status: Home / Self Care | Payer: 59 | Attending: Vascular Surgery | Admitting: Vascular Surgery

## 2020-04-04 DIAGNOSIS — Z87891 Personal history of nicotine dependence: Secondary | ICD-10-CM | POA: Diagnosis not present

## 2020-04-04 DIAGNOSIS — G709 Myoneural disorder, unspecified: Secondary | ICD-10-CM | POA: Diagnosis not present

## 2020-04-04 DIAGNOSIS — I998 Other disorder of circulatory system: Secondary | ICD-10-CM

## 2020-04-04 DIAGNOSIS — Z8249 Family history of ischemic heart disease and other diseases of the circulatory system: Secondary | ICD-10-CM | POA: Insufficient documentation

## 2020-04-04 DIAGNOSIS — I10 Essential (primary) hypertension: Secondary | ICD-10-CM | POA: Insufficient documentation

## 2020-04-04 DIAGNOSIS — Z7902 Long term (current) use of antithrombotics/antiplatelets: Secondary | ICD-10-CM | POA: Insufficient documentation

## 2020-04-04 DIAGNOSIS — Z7982 Long term (current) use of aspirin: Secondary | ICD-10-CM | POA: Diagnosis not present

## 2020-04-04 DIAGNOSIS — I70231 Atherosclerosis of native arteries of right leg with ulceration of thigh: Secondary | ICD-10-CM | POA: Insufficient documentation

## 2020-04-04 DIAGNOSIS — I70222 Atherosclerosis of native arteries of extremities with rest pain, left leg: Secondary | ICD-10-CM | POA: Diagnosis not present

## 2020-04-04 DIAGNOSIS — Z95828 Presence of other vascular implants and grafts: Secondary | ICD-10-CM | POA: Insufficient documentation

## 2020-04-04 DIAGNOSIS — E785 Hyperlipidemia, unspecified: Secondary | ICD-10-CM | POA: Diagnosis not present

## 2020-04-04 DIAGNOSIS — L97119 Non-pressure chronic ulcer of right thigh with unspecified severity: Secondary | ICD-10-CM | POA: Insufficient documentation

## 2020-04-04 DIAGNOSIS — Z79899 Other long term (current) drug therapy: Secondary | ICD-10-CM | POA: Diagnosis not present

## 2020-04-04 HISTORY — PX: LOWER EXTREMITY ANGIOGRAPHY: CATH118251

## 2020-04-04 HISTORY — PX: PERIPHERAL VASCULAR BALLOON ANGIOPLASTY: CATH118281

## 2020-04-04 LAB — POCT I-STAT, CHEM 8
BUN: 25 mg/dL — ABNORMAL HIGH (ref 8–23)
Calcium, Ion: 1.23 mmol/L (ref 1.15–1.40)
Chloride: 104 mmol/L (ref 98–111)
Creatinine, Ser: 1.4 mg/dL — ABNORMAL HIGH (ref 0.61–1.24)
Glucose, Bld: 97 mg/dL (ref 70–99)
HCT: 38 % — ABNORMAL LOW (ref 39.0–52.0)
Hemoglobin: 12.9 g/dL — ABNORMAL LOW (ref 13.0–17.0)
Potassium: 4.8 mmol/L (ref 3.5–5.1)
Sodium: 140 mmol/L (ref 135–145)
TCO2: 26 mmol/L (ref 22–32)

## 2020-04-04 LAB — POCT ACTIVATED CLOTTING TIME
Activated Clotting Time: 191 seconds
Activated Clotting Time: 175 seconds

## 2020-04-04 SURGERY — LOWER EXTREMITY ANGIOGRAPHY
Anesthesia: LOCAL | Laterality: Right

## 2020-04-04 MED ORDER — HEPARIN (PORCINE) IN NACL 1000-0.9 UT/500ML-% IV SOLN
INTRAVENOUS | Status: DC | PRN
  Administered 2020-04-04 (×2): 500 mL

## 2020-04-04 MED ORDER — HEPARIN SODIUM (PORCINE) 1000 UNIT/ML IJ SOLN
INTRAMUSCULAR | Status: AC
  Filled 2020-04-04: qty 1

## 2020-04-04 MED ORDER — MIDAZOLAM HCL 2 MG/2ML IJ SOLN
INTRAMUSCULAR | Status: DC | PRN
  Administered 2020-04-04: 1 mg via INTRAVENOUS

## 2020-04-04 MED ORDER — LABETALOL HCL 5 MG/ML IV SOLN
INTRAVENOUS | Status: AC
  Filled 2020-04-04: qty 4

## 2020-04-04 MED ORDER — IODIXANOL 320 MG/ML IV SOLN
INTRAVENOUS | Status: DC | PRN
  Administered 2020-04-04: 175 mL via INTRA_ARTERIAL

## 2020-04-04 MED ORDER — SODIUM CHLORIDE 0.9 % WEIGHT BASED INFUSION: 1.0000 mL/kg/h | INTRAVENOUS | Status: DC

## 2020-04-04 MED ORDER — FENTANYL CITRATE (PF) 100 MCG/2ML IJ SOLN
INTRAMUSCULAR | Status: AC
  Filled 2020-04-04: qty 2

## 2020-04-04 MED ORDER — HYDRALAZINE HCL 20 MG/ML IJ SOLN: 5.0000 mg | INTRAMUSCULAR | Status: DC | PRN

## 2020-04-04 MED ORDER — MIDAZOLAM HCL 2 MG/2ML IJ SOLN
INTRAMUSCULAR | Status: AC
  Filled 2020-04-04: qty 2

## 2020-04-04 MED ORDER — SODIUM CHLORIDE 0.9 % IV SOLN: INTRAVENOUS | Status: DC

## 2020-04-04 MED ORDER — HEPARIN (PORCINE) IN NACL 2000-0.9 UNIT/L-% IV SOLN
INTRAVENOUS | Status: DC | PRN
  Administered 2020-04-04: 5000 mL

## 2020-04-04 MED ORDER — HEPARIN (PORCINE) IN NACL 1000-0.9 UT/500ML-% IV SOLN
INTRAVENOUS | Status: AC
  Filled 2020-04-04: qty 1000

## 2020-04-04 MED ORDER — LIDOCAINE HCL (PF) 1 % IJ SOLN
INTRAMUSCULAR | Status: DC | PRN
  Administered 2020-04-04: 18 mg via INTRADERMAL

## 2020-04-04 MED ORDER — FENTANYL CITRATE (PF) 100 MCG/2ML IJ SOLN
INTRAMUSCULAR | Status: DC | PRN
Start: 2020-04-04 — End: 2020-04-04
  Administered 2020-04-04 (×2): 50 ug via INTRAVENOUS

## 2020-04-04 MED ORDER — LABETALOL HCL 5 MG/ML IV SOLN
INTRAVENOUS | Status: DC | PRN
  Administered 2020-04-04 (×2): 10 mg via INTRAVENOUS

## 2020-04-04 MED ORDER — LABETALOL HCL 5 MG/ML IV SOLN
10.0000 mg | INTRAVENOUS | Status: DC | PRN
  Administered 2020-04-04: 10 mg via INTRAVENOUS

## 2020-04-04 MED ORDER — LIDOCAINE HCL (PF) 1 % IJ SOLN
INTRAMUSCULAR | Status: AC
  Filled 2020-04-04: qty 30

## 2020-04-04 SURGICAL SUPPLY — 19 items
BALLN MUSTANG 5X60X75 (BALLOONS) ×3
BALLN MUSTANG 6X60X75 (BALLOONS) ×3
BALLOON MUSTANG 5X60X75 (BALLOONS) ×2 IMPLANT
BALLOON MUSTANG 6X60X75 (BALLOONS) ×2 IMPLANT
CATH ACCU-VU SIZ PIG 5F 70CM (CATHETERS) ×3 IMPLANT
CATH OMNI FLUSH 5F 65CM (CATHETERS) ×3 IMPLANT
DEVICE CONTINUOUS FLUSH (MISCELLANEOUS) ×3 IMPLANT
KIT ENCORE 26 ADVANTAGE (KITS) ×3 IMPLANT
KIT MICROPUNCTURE NIT STIFF (SHEATH) ×3 IMPLANT
KIT PV (KITS) ×3 IMPLANT
SHEATH PINNACLE 5F 10CM (SHEATH) ×3 IMPLANT
SHEATH PINNACLE 6F 10CM (SHEATH) ×3 IMPLANT
SHEATH PROBE COVER 6X72 (BAG) ×3 IMPLANT
STENT ELUVIA 6X60X130 (Permanent Stent) ×3 IMPLANT
SYR MEDRAD MARK V 150ML (SYRINGE) ×3 IMPLANT
TRANSDUCER W/STOPCOCK (MISCELLANEOUS) ×3 IMPLANT
TRAY PV CATH (CUSTOM PROCEDURE TRAY) ×3 IMPLANT
WIRE BENTSON .035X145CM (WIRE) ×3 IMPLANT
WIRE ROSEN-J .035X260CM (WIRE) ×3 IMPLANT

## 2020-04-04 NOTE — Discharge Instructions (Signed)
Femoral Site Care This sheet gives you information about how to care for yourself after your procedure. Your health care provider may also give you more specific instructions. If you have problems or questions, contact your health care provider. What can I expect after the procedure? After the procedure, it is common to have:  Bruising that usually fades within 1-2 weeks.  Tenderness at the site. Follow these instructions at home: Wound care  Follow instructions from your health care provider about how to take care of your insertion site. Make sure you: ? Wash your hands with soap and water before you change your bandage (dressing). If soap and water are not available, use hand sanitizer. ? Change your dressing as told by your health care provider. ? Leave stitches (sutures), skin glue, or adhesive strips in place. These skin closures may need to stay in place for 2 weeks or longer. If adhesive strip edges start to loosen and curl up, you may trim the loose edges. Do not remove adhesive strips completely unless your health care provider tells you to do that.  Do not take baths, swim, or use a hot tub until your health care provider approves.  You may shower 24-48 hours after the procedure or as told by your health care provider. ? Gently wash the site with plain soap and water. ? Pat the area dry with a clean towel. ? Do not rub the site. This may cause bleeding.  Do not apply powder or lotion to the site. Keep the site clean and dry.  Check your femoral site every day for signs of infection. Check for: ? Redness, swelling, or pain. ? Fluid or blood. ? Warmth. ? Pus or a bad smell. Activity  For the first 2-3 days after your procedure, or as long as directed: ? Avoid climbing stairs as much as possible. ? Do not squat.  Do not lift anything that is heavier than 10 lb (4.5 kg), or the limit that you are told, until your health care provider says that it is safe.  Rest as  directed. ? Avoid sitting for a long time without moving. Get up to take short walks every 1-2 hours.  Do not drive for 24 hours if you were given a medicine to help you relax (sedative). General instructions  Take over-the-counter and prescription medicines only as told by your health care provider.  Keep all follow-up visits as told by your health care provider. This is important. Contact a health care provider if you have:  A fever or chills.  You have redness, swelling, or pain around your insertion site. Get help right away if:  The catheter insertion area swells very fast.  You pass out.  You suddenly start to sweat or your skin gets clammy.  The catheter insertion area is bleeding, and the bleeding does not stop when you hold steady pressure on the area.  The area near or just beyond the catheter insertion site becomes pale, cool, tingly, or numb. These symptoms may represent a serious problem that is an emergency. Do not wait to see if the symptoms will go away. Get medical help right away. Call your local emergency services (911 in the [place] Do not drive yourself to the hospital. Summary  After the procedure, it is common to have bruising that usually fades within 1-2 weeks.  Check your femoral site every day for signs of infection.  Do not lift anything that is heavier than 10 lb (4.5 kg), or the   limit that you are told, until your health care provider says that it is safe. This information is not intended to replace advice given to you by your health care provider. Make sure you discuss any questions you have with your health care provider. Document Revised: 06/23/2017 Document Reviewed: 06/23/2017 Elsevier Patient Education  2020 [employer]  

## 2020-04-04 NOTE — Op Note (Signed)
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 [name], MD Procedure Performed: 1.  Ultrasound-guided cannulation left common femoral artery 2.  Aortogram with bilateral lower extremity runoff 3.  Selection of right common femoral artery and imaging of tibial vessels on the right 4.  Stent of left external iliac artery with 6 x 60 mm balloon Elluvia 5.  Moderate sedation with fentanyl and Versed for 54 minutes   Indications: 64 year old male with a history of extensive stenting of his right lower extremity.  He subsequently underwent stenting of his right external leg artery with concomitant right common femoral to anterior tibial artery bypass.  He now has wounds on the right leg and the bypass is occluded.  He is indicated for angiography possible intervention.  He also has decreased ABI and occasional pain on the left lower extremity.  Findings: The aorta was patent.  Right external leg artery stent is patent.  Left external iliac artery the sheath is occlusive which limited our evaluation of the left lower extremity although common femoral and SFA are patent.  Right SFA is flush occluded there are multiple stents throughout the SFA.  He has profunda runoff which reconstitutes distal anterior tibial artery and peroneal arteries.  No intervention was undertaken on the right lower extremity given the extent of his disease and occluded bypass.  Left lower extremity we placed an external iliac artery stent where previously was 80% stenosis resolved to 0%.  He will be considered for repeat right lower extremity common femoral to either anterior tibial or peroneal artery bypass.  He is high risk for above-the-knee amputation.  We will get vein mapping and ABIs in the coming weeks and schedule possible surgery.    Procedure:  The  patient was identified in the holding area and taken to room 8.  The patient was then placed supine on the table and prepped and draped in the usual sterile fashion.  A time out was called.  Ultrasound was used to evaluate the left common femoral artery.  There was significant scar tissue although the artery was not diseased.  The area was noticed as 1% lidocaine cannulated with direct ultrasound visualization a micropuncture needle followed by wire sheath.  Images saved the permanent record.  Bentson wires placed followed by 5 [place] sheath.  Omni catheter was placed to the level of L1 aortogram was performed followed by lower extremity runoff with the above findings.  Unfortunately left lower extremity is not fully visualized given the extent of disease in the left external leg artery.  We crossed the bifurcation placed catheter in the right common femoral artery perform dedicated views of the right tibial arteries which demonstrates peroneal and anterior tibial artery runoff.  We then exchanged for pigtail catheter perform retrograde angiography demonstrated approximately 7 cm from our hypogastric artery to our common femoral artery.  We then primarily ballooned with a 5 mm balloon and then placed a 6 French sheath and a 6 x 60 mm drug-eluting stent.  This was postdilated with a 6 mm balloon.  Completion angiography demonstrated no residual stenosis.  With that we placed the sheath back into the stent and wire was removed.  Sheath we pulled in postoperative holding.  He tolerated procedure without any complication.    Contrast: 175cc   Aaminah Forrester C. [name], MD [facility] Office: [phone]  Pager: [phone]

## 2020-04-04 NOTE — Interval H&P Note (Signed)
History and Physical Interval Note:  04/04/2020 10:21 AM  Ryan [name]  has presented today for surgery, with the diagnosis of PAD, non healing surgical wound.  The various methods of treatment have been discussed with the patient and family. After consideration of risks, benefits and other options for treatment, the patient has consented to  Procedure(s): LOWER EXTREMITY ANGIOGRAPHY (Right) as a surgical intervention.  The patient's history has been reviewed, patient examined, no change in status, stable for surgery.  I have reviewed the patient's chart and labs.  Questions were answered to the patient's satisfaction.     Servando Snare

## 2020-04-04 NOTE — Progress Notes (Signed)
Patient was given discharge instructions. He verbalized understanding. 

## 2020-04-04 NOTE — Progress Notes (Signed)
Site area: right groin  Site Prior to Removal:  Level 0  Pressure Applied For 20    Minutes Beginning at 1430  Manual:   Yes.    Patient Status During Pull:  Stable  Post Pull Groin Site:  Level 0  Post Pull Instructions Given:  Yes.    Post Pull Pulses Present:  Yes.    Dressing Applied:  Yes.    Comments:  Bed rest started at 1450 X 4 hr.

## 2020-04-05 ENCOUNTER — Encounter (HOSPITAL_COMMUNITY): Payer: Self-pay | Admitting: Vascular Surgery

## 2020-04-05 LAB — POCT ACTIVATED CLOTTING TIME: Activated Clotting Time: 224 seconds

## 2020-04-05 MED FILL — Heparin Sodium (Porcine) Inj 1000 Unit/ML: INTRAMUSCULAR | Qty: 10 | Status: AC

## 2020-04-06 MED FILL — Heparin Sod (Porcine)-NaCl IV Soln 1000 Unit/500ML-0.9%: INTRAVENOUS | Qty: 500 | Status: AC

## 2020-04-11 ENCOUNTER — Encounter: Payer: 59 | Attending: Physician Assistant | Admitting: Physician Assistant

## 2020-04-11 ENCOUNTER — Other Ambulatory Visit: Payer: Self-pay

## 2020-04-11 DIAGNOSIS — I7389 Other specified peripheral vascular diseases: Secondary | ICD-10-CM | POA: Insufficient documentation

## 2020-04-11 DIAGNOSIS — L97819 Non-pressure chronic ulcer of other part of right lower leg with unspecified severity: Secondary | ICD-10-CM | POA: Diagnosis present

## 2020-04-11 DIAGNOSIS — L97812 Non-pressure chronic ulcer of other part of right lower leg with fat layer exposed: Secondary | ICD-10-CM | POA: Insufficient documentation

## 2020-04-11 DIAGNOSIS — I10 Essential (primary) hypertension: Secondary | ICD-10-CM | POA: Insufficient documentation

## 2020-04-11 DIAGNOSIS — T8131XA Disruption of external operation (surgical) wound, not elsewhere classified, initial encounter: Secondary | ICD-10-CM | POA: Diagnosis not present

## 2020-04-11 DIAGNOSIS — Z87891 Personal history of nicotine dependence: Secondary | ICD-10-CM | POA: Diagnosis not present

## 2020-04-11 DIAGNOSIS — X58XXXA Exposure to other specified factors, initial encounter: Secondary | ICD-10-CM | POA: Insufficient documentation

## 2020-04-12 NOTE — Progress Notes (Signed)
DIANNE, BADY ([id number]) Visit Report for 04/11/2020 Abuse/Suicide Risk Screen Details Patient Name: Ryan [name], Ryan [name]. Date of Service: 04/11/2020 9:15 AM Medical Record Number: [id number] Patient Account Number: [id number] Date of Birth/Sex: Apr 29, 1956 (64 y.o. M) Treating RN: Carlene Coria Primary Care Ivyonna Hoelzel: Steele Sizer Other Clinician: Referring Poetry Cerro: Steele Sizer Treating Juventino Pavone/Extender: Melburn Hake, HOYT Weeks in Treatment: 0 Abuse/Suicide Risk Screen Items Answer ABUSE RISK SCREEN: Has anyone close to you tried to hurt or harm you recentlyo No Do you feel uncomfortable with anyone in your familyo No Has anyone forced you do things that you didnot want to doo No Electronic Signature(s) Signed: 04/12/2020 4:54:21 PM By: Carlene Coria RN Entered By: Carlene Coria on 04/11/2020 10:02:21 Ryan [name] ([id number]) -------------------------------------------------------------------------------- Activities of Daily Living Details Patient Name: Ryan [name]. Date of Service: 04/11/2020 9:15 AM Medical Record Number: [id number] Patient Account Number: [id number] Date of Birth/Sex: 05/11/1956 (64 y.o. M) Treating RN: Carlene Coria Primary Care Masha Orbach: Steele Sizer Other Clinician: Referring Fitz Matsuo: Steele Sizer Treating Makeya Hilgert/Extender: Melburn Hake, HOYT Weeks in Treatment: 0 Activities of Daily Living Items Answer Activities of Daily Living (Please select one for each item) Drive Automobile Completely Able Take Medications Completely Able Use Telephone Completely Able Care for Appearance Completely Able Use Toilet Completely Able Bath / Shower Completely Able Dress Self Completely Able Feed Self Completely Able Walk Completely Able Get In / Out Bed Completely Able Housework Completely Able Prepare Meals Completely Able Handle Money Completely Able Shop for Self Completely Able Electronic Signature(s) Signed: 04/12/2020 4:54:21 PM By:  Carlene Coria RN Entered By: Carlene Coria on 04/11/2020 10:02:44 Ryan [name] ([id number]) -------------------------------------------------------------------------------- Education Screening Details Patient Name: Ryan [name]. Date of Service: 04/11/2020 9:15 AM Medical Record Number: [id number] Patient Account Number: [id number] Date of Birth/Sex: [birth year] (64 y.o. M) Treating RN: Carlene Coria Primary Care Timmey Lamba: Steele Sizer Other Clinician: Referring Daisy Mcneel: Steele Sizer Treating Sherica Paternostro/Extender: Melburn Hake, HOYT Weeks in Treatment: 0 Primary Learner Assessed: Patient Learning Preferences/Education Level/Primary Language Learning Preference: Explanation Highest Education Level: High School Preferred Language: English Cognitive Barrier Language Barrier: No Translator Needed: No Memory Deficit: No Emotional Barrier: No Cultural/Religious Beliefs Affecting Medical Care: No Physical Barrier Impaired Vision: No Impaired Hearing: No Decreased Hand dexterity: No Knowledge/Comprehension Knowledge Level: Medium Comprehension Level: High Ability to understand written instructions: High Ability to understand verbal instructions: High Motivation Anxiety Level: Anxious Cooperation: Cooperative Education Importance: Acknowledges Need Interest in Health Problems: Asks Questions Perception: Coherent Willingness to Engage in Self-Management High Activities: Readiness to Engage in Self-Management High Activities: Electronic Signature(s) Signed: 04/12/2020 4:54:21 PM By: Carlene Coria RN Entered By: Carlene Coria on 04/11/2020 10:03:24 Ryan [name] ([id number]) -------------------------------------------------------------------------------- Fall Risk Assessment Details Patient Name: Ryan [name]. Date of Service: 04/11/2020 9:15 AM Medical Record Number: [id number] Patient Account Number: [id number] Date of Birth/Sex: 04/26/1956 (64 y.o.  M) Treating RN: Carlene Coria Primary Care Ariahna Smiddy: Steele Sizer Other Clinician: Referring Lolita Faulds: Steele Sizer Treating Bambie Pizzolato/Extender: Melburn Hake, HOYT Weeks in Treatment: 0 Fall Risk Assessment Items Have you had 2 or more falls in the last 12 monthso 0 No Have you had any fall that resulted in injury in the last 12 monthso 0 No FALLS RISK SCREEN History of falling - immediate or within 3 months 0 No Secondary diagnosis (Do you have 2 or more medical diagnoseso) 0 No Ambulatory aid None/bed rest/wheelchair/nurse 0 No Crutches/cane/walker 0 No Furniture 0 No Intravenous therapy Access/Saline/Heparin Lock 0 No Gait/Transferring Normal/ bed rest/ wheelchair 0 No Weak (  short steps with or without shuffle, stooped but able to lift head while walking, may 0 No seek support from furniture) Impaired (short steps with shuffle, may have difficulty arising from chair, head down, impaired 0 No balance) Mental Status Oriented to own ability 0 No Electronic Signature(s) Signed: 04/12/2020 4:54:21 PM By: Carlene Coria RN Entered By: Carlene Coria on 04/11/2020 10:03:33 Ryan [name] ([id number]) -------------------------------------------------------------------------------- Foot Assessment Details Patient Name: [name], Ryan. Date of Service: 04/11/2020 9:15 AM Medical Record Number: [id number] Patient Account Number: [id number] Date of Birth/Sex: 1955/07/22 (64 y.o. M) Treating RN: Carlene Coria Primary Care Armond Cuthrell: Steele Sizer Other Clinician: Referring Kastiel Simonian: Steele Sizer Treating Carle Dargan/Extender: Melburn Hake, HOYT Weeks in Treatment: 0 Foot Assessment Items Site Locations + = Sensation present, - = Sensation absent, C = Callus, U = Ulcer R = Redness, W = Warmth, M = Maceration, PU = Pre-ulcerative lesion F = Fissure, S = Swelling, D = Dryness Assessment Right: Left: Other Deformity: No No Prior Foot Ulcer: No No Prior Amputation: No No Charcot  Joint: No No Ambulatory Status: Ambulatory Without Help Gait: Steady Electronic Signature(s) Signed: 04/12/2020 4:54:21 PM By: Carlene Coria RN Entered By: Carlene Coria on 04/11/2020 10:05:20 Ryan [name] ([id number]) -------------------------------------------------------------------------------- Nutrition Risk Screening Details Patient Name: MANCE, VALLEJO. Date of Service: 04/11/2020 9:15 AM Medical Record Number: [id number] Patient Account Number: [id number] Date of Birth/Sex: February 16, 1956 (64 y.o. M) Treating RN: Carlene Coria Primary Care Nicolasa Milbrath: Steele Sizer Other Clinician: Referring Vivi Piccirilli: Steele Sizer Treating Kieana Livesay/Extender: Melburn Hake, HOYT Weeks in Treatment: 0 Height (in): 68 Weight (lbs): 147 Body Mass Index (BMI): 22.3 Nutrition Risk Screening Items Score Screening NUTRITION RISK SCREEN: I have an illness or condition that made me change the kind and/or amount of food I eat 0 No I eat fewer than two meals per day 0 No I eat few fruits and vegetables, or milk products 0 No I have three or more drinks of beer, liquor or wine almost every day 0 No I have tooth or mouth problems that make it hard for me to eat 0 No I don't always have enough money to buy the food I need 0 No I eat alone most of the time 0 No I take three or more different prescribed or over-the-counter drugs a day 1 Yes Without wanting to, I have lost or gained 10 pounds in the last six months 0 No I am not always physically able to shop, cook and/or feed myself 0 No Nutrition Protocols Good Risk Protocol 0 No interventions needed Moderate Risk Protocol High Risk Proctocol Risk Level: Good Risk Score: 1 Electronic Signature(s) Signed: 04/12/2020 4:54:21 PM By: Carlene Coria RN Entered By: Carlene Coria on 04/11/2020 10:03:46

## 2020-04-12 NOTE — Progress Notes (Signed)
Ryan [name] ([id number]) Visit Report for 04/11/2020 Allergy List Details Patient Name: Ryan [name], Ryan [name]. Date of Service: 04/11/2020 9:15 AM Medical Record Number: [id number] Patient Account Number: [id number] Date of Birth/Sex: Nov 05, 1955 (64 y.o. M) Treating RN: Carlene Coria Primary Care Lamberto Dinapoli: Steele Sizer Other Clinician: Referring Daley Mooradian: Steele Sizer Treating Fitzgerald Dunne/Extender: Melburn Hake, HOYT Weeks in Treatment: 0 Allergies Active Allergies No Known Drug Allergy Notes Electronic Signature(s) Signed: 04/12/2020 4:54:21 PM By: Carlene Coria RN Entered By: Carlene Coria on 04/11/2020 09:58:05 Ryan [name] ([id number]) -------------------------------------------------------------------------------- Arrival Information Details Patient Name: Ryan [name]. Date of Service: 04/11/2020 9:15 AM Medical Record Number: [id number] Patient Account Number: [id number] Date of Birth/Sex: 1956/05/10 (64 y.o. M) Treating RN: Cornell Barman Primary Care Yarelie Hams: Steele Sizer Other Clinician: Referring Laurianne Floresca: Steele Sizer Treating Damien Cisar/Extender: Melburn Hake, HOYT Weeks in Treatment: 0 Visit Information Patient Arrived: Ambulatory Arrival Time: 09:17 Accompanied By: self Transfer Assistance: None Patient Identification Verified: Yes Secondary Verification Process Completed: Yes Patient Requires Transmission-Based No Precautions: Patient Has Alerts: Yes Patient Alerts: Patient on Blood Thinner Electronic Signature(s) Signed: 04/12/2020 4:54:21 PM By: Carlene Coria RN Entered By: Carlene Coria on 04/11/2020 09:34:42 Ryan [name] ([id number]) -------------------------------------------------------------------------------- Clinic Level of Care Assessment Details Patient Name: Ryan [name]. Date of Service: 04/11/2020 9:15 AM Medical Record Number: [id number] Patient Account Number: [id number] Date of Birth/Sex: 09-13-1955 (64 y.o. M) Treating  RN: Cornell Barman Primary Care Racer Quam: Steele Sizer Other Clinician: Referring Kamillah Didonato: Steele Sizer Treating Treina Arscott/Extender: Melburn Hake, HOYT Weeks in Treatment: 0 Clinic Level of Care Assessment Items TOOL 1 Quantity Score []  - Use when EandM and Procedure is performed on INITIAL visit 0 ASSESSMENTS - Nursing Assessment / Reassessment X - General Physical Exam (combine w/ comprehensive assessment (listed just below) when performed on new 1 20 pt. evals) X- 1 25 Comprehensive Assessment (HX, ROS, Risk Assessments, Wounds Hx, etc.) ASSESSMENTS - Wound and Skin Assessment / Reassessment []  - Dermatologic / Skin Assessment (not related to wound area) 0 ASSESSMENTS - Ostomy and/or Continence Assessment and Care []  - Incontinence Assessment and Management 0 []  - 0 Ostomy Care Assessment and Management (repouching, etc.) PROCESS - Coordination of Care X - Simple Patient / Family Education for ongoing care 1 15 []  - 0 Complex (extensive) Patient / Family Education for ongoing care X- 1 10 Staff obtains Programmer, systems, Records, Test Results / Process Orders []  - 0 Staff telephones HHA, Nursing Homes / Clarify orders / etc []  - 0 Routine Transfer to another Facility (non-emergent condition) []  - 0 Routine Hospital Admission (non-emergent condition) X- 1 15 New Admissions / Biomedical engineer / Ordering NPWT, Apligraf, etc. []  - 0 Emergency Hospital Admission (emergent condition) PROCESS - Special Needs []  - Pediatric / Minor Patient Management 0 []  - 0 Isolation Patient Management []  - 0 Hearing / Language / Visual special needs []  - 0 Assessment of Community assistance (transportation, D/C planning, etc.) []  - 0 Additional assistance / Altered mentation []  - 0 Support Surface(s) Assessment (bed, cushion, seat, etc.) INTERVENTIONS - Miscellaneous []  - External ear exam 0 []  - 0 Patient Transfer (multiple staff / Civil Service fast streamer / Similar devices) []  - 0 Simple Staple /  Suture removal (25 or less) []  - 0 Complex Staple / Suture removal (26 or more) []  - 0 Hypo/Hyperglycemic Management (do not check if billed separately) X- 1 15 Ankle / Brachial Index (ABI) - do not check if billed separately Has the patient been seen at the hospital within the last three  years: Yes Total Score: 100 Level Of Care: New/Established - [address] TYRUS, WILMS ([id number]) Electronic Signature(s) Signed: 04/11/2020 5:31:50 PM By: Gretta Cool, BSN, RN, CWS, Kim RN, BSN Entered By: Gretta Cool, BSN, RN, CWS, Kim on 04/11/2020 10:26:19 Ryan [name] ([id number]) -------------------------------------------------------------------------------- Encounter Discharge Information Details Patient Name: Ryan [name]. Date of Service: 04/11/2020 9:15 AM Medical Record Number: [id number] Patient Account Number: [id number] Date of Birth/Sex: 08/10/1955 (64 y.o. M) Treating RN: Cornell Barman Primary Care Witney Huie: Steele Sizer Other Clinician: Referring Brynleigh Sequeira: Steele Sizer Treating Letia Guidry/Extender: Melburn Hake, HOYT Weeks in Treatment: 0 Encounter Discharge Information Items Post Procedure Vitals Discharge Condition: Stable Temperature (F): 97.9 Ambulatory Status: Ambulatory Pulse (bpm): 84 Discharge Destination: Home Respiratory Rate (breaths/min): 16 Transportation: Private Auto Blood Pressure (mmHg): 159/95 Accompanied By: self Schedule Follow-up Appointment: Yes Clinical Summary of Care: Electronic Signature(s) Signed: 04/11/2020 5:31:50 PM By: Gretta Cool, BSN, RN, CWS, Kim RN, BSN Entered By: Gretta Cool, BSN, RN, CWS, Kim on 04/11/2020 10:31:34 Ryan [name] ([id number]) -------------------------------------------------------------------------------- Lower Extremity Assessment Details Patient Name: Ryan [name]. Date of Service: 04/11/2020 9:15 AM Medical Record Number: [id number] Patient Account Number: [id number] Date of Birth/Sex: 03-15-1956 (64 y.o.  M) Treating RN: Carlene Coria Primary Care Shyleigh Daughtry: Steele Sizer Other Clinician: Referring Mikyah Alamo: Steele Sizer Treating Jerlisa Diliberto/Extender: Melburn Hake, HOYT Weeks in Treatment: 0 Edema Assessment Assessed: [Left: No] [Right: No] [Left: Edema] [Right: :] Calf Left: Right: Point of Measurement: 42 cm From Medial Instep 33 cm Ankle Left: Right: Point of Measurement: 10 cm From Medial Instep 22 cm Electronic Signature(s) Signed: 04/12/2020 4:54:21 PM By: Carlene Coria RN Entered By: Carlene Coria on 04/11/2020 09:57:46 Ryan [name] ([id number]) -------------------------------------------------------------------------------- Multi Wound Chart Details Patient Name: Ryan [name]. Date of Service: 04/11/2020 9:15 AM Medical Record Number: [id number] Patient Account Number: [id number] Date of Birth/Sex: 1956/01/07 (64 y.o. M) Treating RN: Cornell Barman Primary Care Keiondre Colee: Steele Sizer Other Clinician: Referring Frenchie Pribyl: Steele Sizer Treating Harriet Bollen/Extender: Melburn Hake, HOYT Weeks in Treatment: 0 Vital Signs Height(in): 68 Pulse(bpm): 84 Weight(lbs): 147 Blood Pressure(mmHg): 159/95 Body Mass Index(BMI): 22 Temperature(F): 97.9 Respiratory Rate(breaths/min): 16 Photos: [N/A:N/A] Wound Location: Right, Lateral Lower Leg N/A N/A Wounding Event: Surgical Injury N/A N/A Primary Etiology: Dehisced Wound N/A N/A Date Acquired: 09/08/2019 N/A N/A Weeks of Treatment: 0 N/A N/A Wound Status: Open N/A N/A Measurements L x W x D (cm) 16x2.5x0.1 N/A N/A Area (cm) : 31.416 N/A N/A Volume (cm) : 3.142 N/A N/A Classification: Full Thickness Without Exposed N/A N/A Support Structures Exudate Amount: None Present N/A N/A Granulation Amount: None Present (0%) N/A N/A Necrotic Amount: Large (67-100%) N/A N/A Necrotic Tissue: Eschar N/A N/A Epithelialization: None N/A N/A Treatment Notes Electronic Signature(s) Signed: 04/11/2020 5:31:50 PM By: Gretta Cool, BSN, RN,  CWS, Kim RN, BSN Entered By: Gretta Cool, BSN, RN, CWS, Kim on 04/11/2020 10:22:35 Ryan [name] ([id number]) -------------------------------------------------------------------------------- Arvin Details Patient Name: MANSOOR, HILLYARD. Date of Service: 04/11/2020 9:15 AM Medical Record Number: [id number] Patient Account Number: [id number] Date of Birth/Sex: 28-Dec-1955 (64 y.o. M) Treating RN: Cornell Barman Primary Care Elira Colasanti: Steele Sizer Other Clinician: Referring Demaya Hardge: Steele Sizer Treating Oryn Casanova/Extender: Melburn Hake, HOYT Weeks in Treatment: 0 Active Inactive Necrotic Tissue Nursing Diagnoses: Impaired tissue integrity related to necrotic/devitalized tissue Knowledge deficit related to management of necrotic/devitalized tissue Goals: Necrotic/devitalized tissue will be minimized in the wound bed Date Initiated: 04/11/2020 Target Resolution Date: 04/25/2020 Goal Status: Active Patient/caregiver will verbalize understanding of reason and process for debridement of necrotic tissue Date Initiated: 04/11/2020  Target Resolution Date: 04/25/2020 Goal Status: Active Interventions: Assess patient pain level pre-, during and post procedure and prior to discharge Provide education on necrotic tissue and debridement process Treatment Activities: Apply topical anesthetic as ordered : 04/11/2020 Notes: Orientation to the Wound Care Program Nursing Diagnoses: Knowledge deficit related to the wound healing center program Goals: Patient/caregiver will verbalize understanding of the [facility] Date Initiated: 04/11/2020 Target Resolution Date: 04/25/2020 Goal Status: Active Interventions: Provide education on orientation to the wound center Notes: Tissue Oxygenation Nursing Diagnoses: Actual ineffective tissue perfusion; peripheral (select once diagnosis is confirmed) Knowledge deficit related to disease process and  management Potential alteration in peripheral tissue perfusion (select prior to confirmation of diagnosis) Goals: Invasive arterial studies completed as ordered Date Initiated: 04/11/2020 Target Resolution Date: 04/07/2020 Goal Status: Active Non-invasive arterial studies are completed as ordered Date Initiated: 04/11/2020 Target Resolution Date: 04/07/2020 Goal Status: Active Patient/caregiver will verbalize understanding of disease process and disease management Date Initiated: 04/11/2020 Target Resolution Date: 04/25/2020 KIPP, SHANK ([id number]) Goal Status: Active Interventions: Assess patient understanding of disease process and management upon diagnosis and as needed Assess peripheral arterial status upon admission and as needed Treatment Activities: Ankle Brachial Index (ABI) : 04/07/2020 Invasive vascular studies : 04/11/2020 Revascularization procedures : 04/11/2020 Notes: Wound/Skin Impairment Nursing Diagnoses: Impaired tissue integrity Knowledge deficit related to smoking impact on wound healing Knowledge deficit related to ulceration/compromised skin integrity Goals: Patient/caregiver will verbalize understanding of skin care regimen Date Initiated: 04/11/2020 Target Resolution Date: 04/25/2020 Goal Status: Active Ulcer/skin breakdown will have a volume reduction of 30% by week 4 Date Initiated: 04/11/2020 Target Resolution Date: 05/12/2020 Goal Status: Active Interventions: Assess patient/caregiver ability to perform ulcer/skin care regimen upon admission and as needed Provide education on ulcer and skin care Treatment Activities: Skin care regimen initiated : 04/11/2020 Topical wound management initiated : 04/11/2020 Notes: Electronic Signature(s) Signed: 04/11/2020 5:31:50 PM By: Gretta Cool, BSN, RN, CWS, Kim RN, BSN Entered By: Gretta Cool, BSN, RN, CWS, Kim on 04/11/2020 10:22:16 BANNON, GIAMMARCO  ([id number]) -------------------------------------------------------------------------------- Pain Assessment Details Patient Name: BUFFORD, HELMS. Date of Service: 04/11/2020 9:15 AM Medical Record Number: [id number] Patient Account Number: [id number] Date of Birth/Sex: 1956-04-17 (64 y.o. M) Treating RN: Cornell Barman Primary Care Lulie Hurd: Steele Sizer Other Clinician: Referring Elpidia Karn: Steele Sizer Treating Harl Wiechmann/Extender: Melburn Hake, HOYT Weeks in Treatment: 0 Active Problems Location of Pain Severity and Description of Pain Patient Has Paino Yes Site Locations With Dressing Change: Yes Duration of the Pain. Constant / Intermittento Intermittent How Long Does it Lasto Hours: Minutes: 15 Rate the pain. Current Pain Level: 0 Worst Pain Level: 5 Least Pain Level: 5 Tolerable Pain Level: 5 Character of Pain Describe the Pain: Burning Pain Management and Medication Current Pain Management: Medication: No Cold Application: No Rest: Yes Massage: No Activity: No T.E.N.S.: No Heat Application: No Leg drop or elevation: No Is the Current Pain Management Adequate: Inadequate How does your wound impact your activities of daily livingo Sleep: No Bathing: No Appetite: No Relationship With Others: No Bladder Continence: No Emotions: No Bowel Continence: No Work: No Toileting: No Drive: No Dressing: No Hobbies: No Engineer, maintenance) Signed: 04/11/2020 5:31:50 PM By: Gretta Cool, BSN, RN, CWS, Kim RN, BSN Signed: 04/12/2020 4:54:21 PM By: Carlene Coria RN Entered By: Carlene Coria on 04/11/2020 09:35:27 Ryan [name] ([id number]) -------------------------------------------------------------------------------- Patient/Caregiver Education Details Patient Name: LERONE, ONDER. Date of Service: 04/11/2020 9:15 AM Medical Record Number: [id number] Patient Account Number: [id number] Date of Birth/Gender: 1955/08/15 (64  y.o. M) Treating RN: Cornell Barman Primary  Care Physician: Steele Sizer Other Clinician: Referring Physician: Steele Sizer Treating Physician/Extender: Sharalyn Ink in Treatment: 0 Education Assessment Education Provided To: Patient Education Topics Provided Wound Debridement: Handouts: Wound Debridement Methods: Demonstration, Explain/Verbal Responses: State content correctly Wound/Skin Impairment: Handouts: Caring for Your Ulcer, Other: wound care as prescribed Methods: Demonstration, Explain/Verbal Responses: State content correctly Electronic Signature(s) Signed: 04/11/2020 5:31:50 PM By: Gretta Cool, BSN, RN, CWS, Kim RN, BSN Entered By: Gretta Cool, BSN, RN, CWS, Kim on 04/11/2020 10:26:50 CLEMENTS, TORO ([id number]) -------------------------------------------------------------------------------- Wound Assessment Details Patient Name: AZIAH, BROSTROM. Date of Service: 04/11/2020 9:15 AM Medical Record Number: [id number] Patient Account Number: [id number] Date of Birth/Sex: Mar 30, 1956 (64 y.o. M) Treating RN: Carlene Coria Primary Care Patriciann Becht: Steele Sizer Other Clinician: Referring Zorion Nims: Steele Sizer Treating Cordell Guercio/Extender: Melburn Hake, HOYT Weeks in Treatment: 0 Wound Status Wound Number: 1 Primary Etiology: Dehisced Wound Wound Location: Right, Lateral Lower Leg Wound Status: Open Wounding Event: Surgical Injury Comorbid History: Hypertension, Peripheral Venous Disease Date Acquired: 09/08/2019 Weeks Of Treatment: 0 Clustered Wound: No Photos Wound Measurements Length: (cm) 16 Width: (cm) 2.5 Depth: (cm) 0.1 Area: (cm) 31.416 Volume: (cm) 3.142 % Reduction in Area: 0% % Reduction in Volume: 0% Epithelialization: None Tunneling: No Undermining: No Wound Description Classification: Full Thickness Without Exposed Support Structu Wound Margin: Flat and Intact Exudate Amount: Medium Exudate Type: Serous Exudate Color: amber res Foul Odor After Cleansing: No Slough/Fibrino  Yes Wound Bed Granulation Amount: None Present (0%) Necrotic Amount: Large (67-100%) Necrotic Quality: Eschar Treatment Notes Wound #1 (Right, Lateral Lower Leg) Notes Santyl, saline moistened gauze Electronic Signature(s) Signed: 04/11/2020 1:28:36 PM By: Gretta Cool, BSN, RN, CWS, Kim RN, BSN Signed: 04/12/2020 4:54:21 PM By: Carlene Coria RN Entered By: Gretta Cool, BSN, RN, CWS, Kim on 04/11/2020 13:28:36 HESSTON, HITCHENS ([id number], GATT. ([id number]) -------------------------------------------------------------------------------- Vitals Details Patient Name: OTT, ZIMMERLE. Date of Service: 04/11/2020 9:15 AM Medical Record Number: [id number] Patient Account Number: [id number] Date of Birth/Sex: 01/20/1956 (64 y.o. M) Treating RN: Cornell Barman Primary Care Roey Coopman: Steele Sizer Other Clinician: Referring Josalynn Johndrow: Steele Sizer Treating Airis Barbee/Extender: Melburn Hake, HOYT Weeks in Treatment: 0 Vital Signs Time Taken: 09:17 Temperature (F): 97.9 Height (in): 68 Pulse (bpm): 84 Source: Stated Respiratory Rate (breaths/min): 16 Weight (lbs): 147 Blood Pressure (mmHg): 159/95 Source: Measured Reference Range: 80 - 120 mg / dl Body Mass Index (BMI): 22.3 Electronic Signature(s) Signed: 04/12/2020 4:54:21 PM By: Carlene Coria RN Entered By: Carlene Coria on 04/11/2020 09:35:34

## 2020-04-12 NOTE — Progress Notes (Signed)
Ryan [name] ([id number]) Visit Report for 04/11/2020 Chief Complaint Document Details Patient Name: Ryan [name], Ryan [name]. Date of Service: 04/11/2020 9:15 AM Medical Record Number: [id number] Patient Account Number: [id number] Date of Birth/Sex: [birth year] (64 y.o. M) Treating RN: Cornell Barman Primary Care Provider: Steele Sizer Other Clinician: Referring Provider: Steele Sizer Treating Provider/Extender: Melburn Hake, Selenia Mihok Weeks in Treatment: 0 Information Obtained from: Patient Chief Complaint Right LE Ulcer Electronic Signature(s) Signed: 04/11/2020 10:36:28 AM By: Worthy Keeler PA-C Entered By: Worthy Keeler on 04/11/2020 10:36:28 [name] ([id number]) -------------------------------------------------------------------------------- Debridement Details Patient Name: Ryan [name]. Date of Service: 04/11/2020 9:15 AM Medical Record Number: [id number] Patient Account Number: [id number] Date of Birth/Sex: 1956-01-23 (64 y.o. M) Treating RN: Cornell Barman Primary Care Provider: Steele Sizer Other Clinician: Referring Provider: Steele Sizer Treating Provider/Extender: Melburn Hake, Jeanni Allshouse Weeks in Treatment: 0 Debridement Performed for Wound #1 Right,Lateral Lower Leg Assessment: Performed By: Physician STONE III, Kiearra Oyervides E., PA-C Debridement Type: Chemical/Enzymatic/Mechanical Agent Used: Santyl Level of Consciousness (Pre- Awake and Alert procedure): Pre-procedure Verification/Time Out Yes - 10:25 Taken: Pain Control: Lidocaine Instrument: Other : tongue blade Bleeding: None Response to Treatment: Procedure was tolerated well Level of Consciousness (Post- Awake and Alert procedure): Post Debridement Measurements of Total Wound Length: (cm) 16 Width: (cm) 2.5 Depth: (cm) 0.1 Volume: (cm) 3.142 Character of Wound/Ulcer Post Debridement: Stable Post Procedure Diagnosis Same as Pre-procedure Electronic Signature(s) Signed: 04/11/2020 5:31:50 PM  By: Gretta Cool, BSN, RN, CWS, Kim RN, BSN Signed: 04/12/2020 4:38:25 PM By: Worthy Keeler PA-C Entered By: Gretta Cool, BSN, RN, CWS, Kim on 04/11/2020 10:25:56 [name] ([id number]) -------------------------------------------------------------------------------- HPI Details Patient Name: Ryan [name]. Date of Service: 04/11/2020 9:15 AM Medical Record Number: [id number] Patient Account Number: [id number] Date of Birth/Sex: Dec 29, 1955 (64 y.o. M) Treating RN: Cornell Barman Primary Care Provider: Steele Sizer Other Clinician: Referring Provider: Steele Sizer Treating Provider/Extender: Melburn Hake, Odie Rauen Weeks in Treatment: 0 History of Present Illness HPI Description: 04/11/2020 patient presents for initial inspection here in the clinic concerning issues with a wound on the right lateral lower extremity which is actually side of one of his lower extremity bypass surgery locations. Nonetheless he tells me that right now this area has opened up and he is having a lot of trouble he never really had this completely healed. He did see Dr. Gwenlyn Saran who is his vascular doctor unfortunately the patient is going require repeat bypass of the lower extremity he has literally almost no flow here compared to what he should have his ABI 0.18 and TBI at 0 with no dopplerable flow detected. Nonetheless this is not good especially considering he has a wound that obviously is arterial in nature and does not appear to be wanting to heal appropriately. The patient does have hypertension and obviously peripheral vascular disease but is not diabetic he is a former smoker but did quit years ago. Obviously this probably contributed to some of his arterial issues. He is stated to be at high risk for amputation if the second bypass were not to be successful based on what we are seeing today. Obviously he has some swelling as well he likely has some venous disease but the main issue right now is the arterial side of  things. Electronic Signature(s) Signed: 04/11/2020 3:40:06 PM By: Worthy Keeler PA-C Entered By: Worthy Keeler on 04/11/2020 15:40:05 [name] ([id number]) -------------------------------------------------------------------------------- Physical Exam Details Patient Name: Ryan [name]. Date of Service: 04/11/2020 9:15 AM Medical Record Number: [id number]  Patient Account Number: [id number] Date of Birth/Sex: February 08, 1956 (64 y.o. M) Treating RN: Cornell Barman Primary Care Provider: Steele Sizer Other Clinician: Referring Provider: Steele Sizer Treating Provider/Extender: Melburn Hake, Chrissi Crow Weeks in Treatment: 0 Constitutional patient is hypertensive.. pulse regular and within target range for patient.Marland Kitchen respirations regular, non-labored and within target range for patient.Marland Kitchen temperature within target range for patient.. Well-nourished and well-hydrated in no acute distress. Eyes conjunctiva clear no eyelid edema noted. pupils equal round and reactive to light and accommodation. Ears, Nose, Mouth, and Throat no gross abnormality of ear auricles or external auditory canals. normal hearing noted during conversation. mucus membranes moist. Respiratory normal breathing without difficulty. Cardiovascular Absent posterior tibial and dorsalis pedis pulses bilateral lower extremities. 1+ pitting edema of the bilateral lower extremities. Musculoskeletal normal gait and posture. no significant deformity or arthritic changes, no loss or range of motion, no clubbing. Psychiatric this patient is able to make decisions and demonstrates good insight into disease process. Alert and Oriented x 3. pleasant and cooperative. Notes Upon inspection patient's wound bed actually showed signs of necrotic tissue of the surface of the wound that really needs sharp debridement but again with his arterial flow lacking that is definitely not something we are going to do at this point. Nonetheless I do  believe that he would benefit from enzymatic debridement with Santyl and this is something I am going to send into the pharmacy for him today. He is able to ambulate today he does have pain however in the legs especially the affected/right leg area where unfortunately he hasn't improved flow the left was able to be balloon angioplasty intervened and improved. Electronic Signature(s) Signed: 04/11/2020 3:41:11 PM By: Worthy Keeler PA-C Entered By: Worthy Keeler on 04/11/2020 15:41:09 [name] ([id number]) -------------------------------------------------------------------------------- Physician Orders Details Patient Name: BLANCA, THORNTON. Date of Service: 04/11/2020 9:15 AM Medical Record Number: [id number] Patient Account Number: [id number] Date of Birth/Sex: 12-31-1955 (64 y.o. M) Treating RN: Cornell Barman Primary Care Provider: Steele Sizer Other Clinician: Referring Provider: Steele Sizer Treating Provider/Extender: Melburn Hake, Tion Tse Weeks in Treatment: 0 Verbal / Phone Orders: No Diagnosis Coding Wound Cleansing Wound #1 Right,Lateral Lower Leg o Clean wound with Normal Saline. Anesthetic (add to Medication List) Wound #1 Right,Lateral Lower Leg o Topical Lidocaine 4% cream applied to wound bed prior to debridement (In Clinic Only). Primary Wound Dressing Wound #1 Right,Lateral Lower Leg o Santyl Ointment Secondary Dressing Wound #1 Right,Lateral Lower Leg o ABD and Kerlix/Conform o Saline moistened gauze Dressing Change Frequency Wound #1 Right,Lateral Lower Leg o Change dressing every day. Follow-up Appointments Wound #1 Right,Lateral Lower Leg o Return Appointment in 2 weeks. Edema Control Wound #1 Right,Lateral Lower Leg o Elevate legs to the level of the heart and pump ankles as often as possible Additional Orders / Instructions Wound #1 Right,Lateral Lower Leg o Increase protein intake. Medications-please add to medication  list. Wound #1 Right,Lateral Lower Leg o Santyl Enzymatic Ointment Patient Medications Allergies: No Known Drug Notifications Medication Indication Start End Santyl 04/11/2020 DOSE topical 250 unit/gram ointment - ointment topical Apply nickel thick daily to the wound bed and then cover with a dressing as directed in clinic x 30 days Electronic Signature(s) JAYSHAWN, COLSTON ([id number]) Signed: 04/11/2020 1:08:10 PM By: Worthy Keeler PA-C Entered By: Worthy Keeler on 04/11/2020 13:08:09 [name] ([id number]) -------------------------------------------------------------------------------- Problem List Details Patient Name: AIKEN, WITHEM. Date of Service: 04/11/2020 9:15 AM Medical Record Number: [id number] Patient Account Number: [id number] Date  of Birth/Sex: Mar 26, 1956 (64 y.o. M) Treating RN: Cornell Barman Primary Care Provider: Steele Sizer Other Clinician: Referring Provider: Steele Sizer Treating Provider/Extender: Melburn Hake, Alyn Jurney Weeks in Treatment: 0 Active Problems ICD-10 Encounter Code Description Active Date MDM Diagnosis T81.31XA Disruption of external operation (surgical) wound, not elsewhere 04/11/2020 No Yes classified, initial encounter L97.812 Non-pressure chronic ulcer of other part of right lower leg with fat layer 04/11/2020 No Yes exposed I73.89 Other specified peripheral vascular diseases 04/11/2020 No Yes I10 Essential (primary) hypertension 04/11/2020 No Yes Inactive Problems Resolved Problems Electronic Signature(s) Signed: 04/11/2020 10:34:40 AM By: Worthy Keeler PA-C Entered By: Worthy Keeler on 04/11/2020 10:34:40 [name] ([id number]) -------------------------------------------------------------------------------- Progress Note Details Patient Name: [name]. Date of Service: 04/11/2020 9:15 AM Medical Record Number: [id number] Patient Account Number: [id number] Date of Birth/Sex: Dec 14, 1955 (64 y.o.  M) Treating RN: Cornell Barman Primary Care Provider: Steele Sizer Other Clinician: Referring Provider: Steele Sizer Treating Provider/Extender: Melburn Hake, Brentlee Sciara Weeks in Treatment: 0 Subjective Chief Complaint Information obtained from Patient Right LE Ulcer History of Present Illness (HPI) 04/11/2020 patient presents for initial inspection here in the clinic concerning issues with a wound on the right lateral lower extremity which is actually side of one of his lower extremity bypass surgery locations. Nonetheless he tells me that right now this area has opened up and he is having a lot of trouble he never really had this completely healed. He did see Dr. Gwenlyn Saran who is his vascular doctor unfortunately the patient is going require repeat bypass of the lower extremity he has literally almost no flow here compared to what he should have his ABI 0.18 and TBI at 0 with no dopplerable flow detected. Nonetheless this is not good especially considering he has a wound that obviously is arterial in nature and does not appear to be wanting to heal appropriately. The patient does have hypertension and obviously peripheral vascular disease but is not diabetic he is a former smoker but did quit years ago. Obviously this probably contributed to some of his arterial issues. He is stated to be at high risk for amputation if the second bypass were not to be successful based on what we are seeing today. Obviously he has some swelling as well he likely has some venous disease but the main issue right now is the arterial side of things. Patient History Unable to Obtain Patient History due to Altered Mental Status. Allergies No Known Drug Family History Hypertension - Mother,Father, No family history of Cancer, Diabetes, Heart Disease, Hereditary Spherocytosis, Kidney Disease, Lung Disease, Stroke, Thyroid Problems, Tuberculosis. Social History Former smoker, Marital Status - Single, Alcohol Use - Moderate,  Drug Use - No History, Caffeine Use - Daily. Medical History Eyes Denies history of Cataracts, Glaucoma, Optic Neuritis Ear/Nose/Mouth/Throat Denies history of Chronic sinus problems/congestion, Middle ear problems Hematologic/Lymphatic Denies history of Anemia, Hemophilia, Human Immunodeficiency Virus, Lymphedema Respiratory Denies history of Aspiration, Asthma, Chronic Obstructive Pulmonary Disease (COPD), Pneumothorax, Sleep Apnea, Tuberculosis Cardiovascular Patient has history of Hypertension, Peripheral Venous Disease Denies history of Angina, Arrhythmia, Congestive Heart Failure, Coronary Artery Disease, Deep Vein Thrombosis, Hypotension, Myocardial Infarction, Peripheral Arterial Disease, Phlebitis, Vasculitis Gastrointestinal Denies history of Cirrhosis , Colitis, Crohn s, Hepatitis A, Hepatitis B, Hepatitis C Endocrine Denies history of Type I Diabetes, Type II Diabetes Genitourinary Denies history of End Stage Renal Disease Immunological Denies history of Lupus Erythematosus, Raynaud s, Scleroderma Integumentary (Skin) Denies history of History of Burn Musculoskeletal Denies history of Gout, Rheumatoid Arthritis,  Osteoarthritis, Osteomyelitis Neurologic Denies history of Dementia, Neuropathy, Quadriplegia, Paraplegia, Seizure Disorder Oncologic Denies history of Received Chemotherapy, Received Radiation Psychiatric Denies history of Anorexia/bulimia, Confinement Anxiety Review of Systems (ROS) Constitutional Symptoms ([place]) JAMONTE, CURFMAN. ([id number]) Denies complaints or symptoms of Fatigue, Fever, Chills, Marked Weight Change. Eyes Denies complaints or symptoms of Dry Eyes, Vision Changes, Glasses / Contacts. Ear/Nose/Mouth/Throat Denies complaints or symptoms of Difficult clearing ears, Sinusitis. Hematologic/Lymphatic Denies complaints or symptoms of Bleeding / Clotting Disorders, Human Immunodeficiency Virus. Respiratory Denies complaints or  symptoms of Chronic or frequent coughs, Shortness of Breath. Cardiovascular Denies complaints or symptoms of Chest pain, LE edema. Gastrointestinal Denies complaints or symptoms of Frequent diarrhea, Nausea, Vomiting. Endocrine Denies complaints or symptoms of Hepatitis, Thyroid disease, Polydypsia (Excessive Thirst). Genitourinary Denies complaints or symptoms of Kidney failure/ Dialysis, Incontinence/dribbling. Immunological Denies complaints or symptoms of Hives, Itching. Integumentary (Skin) Denies complaints or symptoms of Wounds, Bleeding or bruising tendency, Breakdown, Swelling. Musculoskeletal Denies complaints or symptoms of Muscle Pain, Muscle Weakness. Neurologic Denies complaints or symptoms of Numbness/parasthesias, Focal/Weakness. Psychiatric Denies complaints or symptoms of Anxiety, Claustrophobia. Objective Constitutional patient is hypertensive.. pulse regular and within target range for patient.Marland Kitchen respirations regular, non-labored and within target range for patient.Marland Kitchen temperature within target range for patient.. Well-nourished and well-hydrated in no acute distress. Vitals Time Taken: 9:17 AM, Height: 68 in, Source: Stated, Weight: 147 lbs, Source: Measured, BMI: 22.3, Temperature: 97.9 F, Pulse: 84 bpm, Respiratory Rate: 16 breaths/min, Blood Pressure: 159/95 mmHg. Eyes conjunctiva clear no eyelid edema noted. pupils equal round and reactive to light and accommodation. Ears, Nose, Mouth, and Throat no gross abnormality of ear auricles or external auditory canals. normal hearing noted during conversation. mucus membranes moist. Respiratory normal breathing without difficulty. Cardiovascular Absent posterior tibial and dorsalis pedis pulses bilateral lower extremities. 1+ pitting edema of the bilateral lower extremities. Musculoskeletal normal gait and posture. no significant deformity or arthritic changes, no loss or range of motion, no  clubbing. Psychiatric this patient is able to make decisions and demonstrates good insight into disease process. Alert and Oriented x 3. pleasant and cooperative. General Notes: Upon inspection patient's wound bed actually showed signs of necrotic tissue of the surface of the wound that really needs sharp debridement but again with his arterial flow lacking that is definitely not something we are going to do at this point. Nonetheless I do believe that he would benefit from enzymatic debridement with Santyl and this is something I am going to send into the pharmacy for him today. He is able to ambulate today he does have pain however in the legs especially the affected/right leg area where unfortunately he hasn't improved flow the left was able to be balloon angioplasty intervened and improved. Integumentary (Hair, Skin) Wound #1 status is Open. Original cause of wound was Surgical Injury. The wound is located on the Right,Lateral Lower Leg. The wound measures 16cm length x 2.5cm width x 0.1cm depth; 31.416cm^2 area and 3.142cm^3 volume. There is no tunneling or undermining noted. There is a medium amount of serous drainage noted. The wound margin is flat and intact. There is no granulation within the wound bed. There is a large (67-100%) amount of necrotic tissue within the wound bed including Eschar. SAVIOR, HIMEBAUGH [place]. ([id number]) Assessment Active Problems ICD-10 Disruption of external operation (surgical) wound, not elsewhere classified, initial encounter Non-pressure chronic ulcer of other part of right lower leg with fat layer exposed Other specified peripheral vascular diseases Essential (primary) hypertension Procedures Wound #1  Pre-procedure diagnosis of Wound #1 is a Dehisced Wound located on the Right,Lateral Lower Leg . There was a Chemical/Enzymatic/Mechanical debridement performed by STONE III, Elysabeth Aust E., PA-C. With the following instrument(s): tongue blade after achieving pain  control using Lidocaine. Agent used was [employer]. A time out was conducted at 10:25, prior to the start of the procedure. There was no bleeding. The procedure was tolerated well. Post Debridement Measurements: 16cm length x 2.5cm width x 0.1cm depth; 3.142cm^3 volume. Character of Wound/Ulcer Post Debridement is stable. Post procedure Diagnosis Wound #1: Same as Pre-Procedure Plan Wound Cleansing: Wound #1 Right,Lateral Lower Leg: Clean wound with Normal Saline. Anesthetic (add to Medication List): Wound #1 Right,Lateral Lower Leg: Topical Lidocaine 4% cream applied to wound bed prior to debridement (In Clinic Only). Primary Wound Dressing: Wound #1 Right,Lateral Lower Leg: Santyl Ointment Secondary Dressing: Wound #1 Right,Lateral Lower Leg: ABD and Kerlix/Conform Saline moistened gauze Dressing Change Frequency: Wound #1 Right,Lateral Lower Leg: Change dressing every day. Follow-up Appointments: Wound #1 Right,Lateral Lower Leg: Return Appointment in 2 weeks. Edema Control: Wound #1 Right,Lateral Lower Leg: Elevate legs to the level of the heart and pump ankles as often as possible Additional Orders / Instructions: Wound #1 Right,Lateral Lower Leg: Increase protein intake. Medications-please add to medication list.: Wound #1 Right,Lateral Lower Leg: Santyl Enzymatic Ointment The following medication(s) was prescribed: Santyl topical 250 unit/gram ointment ointment topical Apply nickel thick daily to the wound bed and then cover with a dressing as directed in clinic x 30 days starting 04/11/2020 1. I would recommend at this time that we actually go ahead and initiate Santyl for the patient to be used on the right lower extremity wound. 2. I'm also can recommend the patient needs to call and follow-up with Dr. Erlene Quan Cain's office to see when they're planning to schedule him for the bypass surgery and where he is going forward. Obviously I think this should be done sooner rather  than later. 3. I'm also can recommend that he needs to try to elevate his legs help with edema control is much as possible compression wraps or not possible with his poor blood flow. 4. I'm also can recommend the patient needs to change the dressing with Santyl daily to try to help keep the area clean and allow the Santyl the CESARE, SUMLIN. ([id number]) most effective way to clean up some of the surface of the wound at this point he is can cover it with a saline moistened gauze and then ABD pad rolled lightly with roll gauze to secure. We will see patient back for reevaluation in 2 weeks here in the clinic. If anything worsens or changes patient will contact our office for additional recommendations. Electronic Signature(s) Signed: 04/11/2020 3:42:13 PM By: Worthy Keeler PA-C Entered By: Worthy Keeler on 04/11/2020 15:42:12 [name] ([id number]) -------------------------------------------------------------------------------- ROS/PFSH Details Patient Name: TJAY, VELAZQUEZ. Date of Service: 04/11/2020 9:15 AM Medical Record Number: [id number] Patient Account Number: [id number] Date of Birth/Sex: Sep 07, 1955 (64 y.o. M) Treating RN: Carlene Coria Primary Care Provider: Steele Sizer Other Clinician: Referring Provider: Steele Sizer Treating Provider/Extender: Melburn Hake, Raveen Wieseler Weeks in Treatment: 0 Unable to Obtain Patient History due to oo Altered Mental Status Constitutional Symptoms (General Health) Complaints and Symptoms: Negative for: Fatigue; Fever; Chills; Marked Weight Change Eyes Complaints and Symptoms: Negative for: Dry Eyes; Vision Changes; Glasses / Contacts Medical History: Negative for: Cataracts; Glaucoma; Optic Neuritis Ear/Nose/Mouth/Throat Complaints and Symptoms: Negative for: Difficult clearing ears; Sinusitis Medical History: Negative  for: Chronic sinus problems/congestion; Middle ear problems Hematologic/Lymphatic Complaints and  Symptoms: Negative for: Bleeding / Clotting Disorders; Human Immunodeficiency Virus Medical History: Negative for: Anemia; Hemophilia; Human Immunodeficiency Virus; Lymphedema Respiratory Complaints and Symptoms: Negative for: Chronic or frequent coughs; Shortness of Breath Medical History: Negative for: Aspiration; Asthma; Chronic Obstructive Pulmonary Disease (COPD); Pneumothorax; Sleep Apnea; Tuberculosis Cardiovascular Complaints and Symptoms: Negative for: Chest pain; LE edema Medical History: Positive for: Hypertension; Peripheral Venous Disease Negative for: Angina; Arrhythmia; Congestive Heart Failure; Coronary Artery Disease; Deep Vein Thrombosis; Hypotension; Myocardial Infarction; Peripheral Arterial Disease; Phlebitis; Vasculitis Gastrointestinal Complaints and Symptoms: Negative for: Frequent diarrhea; Nausea; Vomiting Medical History: Negative for: Cirrhosis ; Colitis; Crohnos; Hepatitis A; Hepatitis B; Hepatitis C Endocrine ALDER, MURRI. ([id number]) Complaints and Symptoms: Negative for: Hepatitis; Thyroid disease; Polydypsia (Excessive Thirst) Medical History: Negative for: Type I Diabetes; Type II Diabetes Genitourinary Complaints and Symptoms: Negative for: Kidney failure/ Dialysis; Incontinence/dribbling Medical History: Negative for: End Stage Renal Disease Immunological Complaints and Symptoms: Negative for: Hives; Itching Medical History: Negative for: Lupus Erythematosus; Raynaudos; Scleroderma Integumentary (Skin) Complaints and Symptoms: Negative for: Wounds; Bleeding or bruising tendency; Breakdown; Swelling Medical History: Negative for: History of Burn Musculoskeletal Complaints and Symptoms: Negative for: Muscle Pain; Muscle Weakness Medical History: Negative for: Gout; Rheumatoid Arthritis; Osteoarthritis; Osteomyelitis Neurologic Complaints and Symptoms: Negative for: Numbness/parasthesias; Focal/Weakness Medical History: Negative  for: Dementia; Neuropathy; Quadriplegia; Paraplegia; Seizure Disorder Psychiatric Complaints and Symptoms: Negative for: Anxiety; Claustrophobia Medical History: Negative for: Anorexia/bulimia; Confinement Anxiety Oncologic Medical History: Negative for: Received Chemotherapy; Received Radiation Immunizations Pneumococcal Vaccine: Received Pneumococcal Vaccination: No Implantable Devices None Family and Social History Cancer: No; Diabetes: No; Heart Disease: No; Hereditary Spherocytosis: No; Hypertension: Yes - Mother,Father; Kidney Disease: No; Lung Disease: No; Stroke: No; Thyroid Problems: No; Tuberculosis: No; Former smoker; Marital Status - Single; Alcohol Use: Moderate; Drug Use: No JETSON, PICKREL. ([id number]) History; Caffeine Use: Daily; Financial Concerns: No; Food, Clothing or Shelter Needs: No; Support System Lacking: No; Transportation Concerns: No Electronic Signature(s) Signed: 04/12/2020 4:38:25 PM By: Worthy Keeler PA-C Signed: 04/12/2020 4:54:21 PM By: Carlene Coria RN Entered By: Carlene Coria on 04/11/2020 10:02:11 [name] ([id number]) -------------------------------------------------------------------------------- [place] Details Patient Name: JOSIAS, TOMERLIN. Date of Service: 04/11/2020 Medical Record Number: [id number] Patient Account Number: [id number] Date of Birth/Sex: September 10, 1955 (64 y.o. M) Treating RN: Cornell Barman Primary Care Provider: Steele Sizer Other Clinician: Referring Provider: Steele Sizer Treating Provider/Extender: Melburn Hake, Madeline Bebout Weeks in Treatment: 0 Diagnosis Coding ICD-10 Codes Code Description T81.31XA Disruption of external operation (surgical) wound, not elsewhere classified, initial encounter L97.812 Non-pressure chronic ulcer of other part of right lower leg with fat layer exposed I73.89 Other specified peripheral vascular diseases I10 Essential (primary) hypertension Facility Procedures CPT4 Code:  23762831 Description: 99213 - WOUND CARE VISIT-LEV 3 EST PT Modifier: Quantity: 1 CPT4 Code: 51761607 Description: 37106 - DEBRIDE W/O ANES NON SELECT Modifier: Quantity: 1 Physician Procedures CPT4 Code: 2694854 Description: 62703 - WC PHYS LEVEL 4 - NEW PT Modifier: Quantity: 1 CPT4 Code: Description: ICD-10 Diagnosis Description T81.31XA Disruption of external operation (surgical) wound, not elsewhere classifi 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

## 2020-04-25 ENCOUNTER — Encounter: Payer: 59 | Attending: Physician Assistant | Admitting: Physician Assistant

## 2020-04-25 ENCOUNTER — Other Ambulatory Visit: Payer: Self-pay

## 2020-04-25 DIAGNOSIS — I7389 Other specified peripheral vascular diseases: Secondary | ICD-10-CM | POA: Diagnosis not present

## 2020-04-25 DIAGNOSIS — T8131XA Disruption of external operation (surgical) wound, not elsewhere classified, initial encounter: Secondary | ICD-10-CM | POA: Insufficient documentation

## 2020-04-25 DIAGNOSIS — L97812 Non-pressure chronic ulcer of other part of right lower leg with fat layer exposed: Secondary | ICD-10-CM | POA: Diagnosis not present

## 2020-04-25 DIAGNOSIS — Z87891 Personal history of nicotine dependence: Secondary | ICD-10-CM | POA: Diagnosis not present

## 2020-04-25 DIAGNOSIS — I10 Essential (primary) hypertension: Secondary | ICD-10-CM | POA: Insufficient documentation

## 2020-04-25 DIAGNOSIS — L97819 Non-pressure chronic ulcer of other part of right lower leg with unspecified severity: Secondary | ICD-10-CM | POA: Diagnosis present

## 2020-04-25 NOTE — Progress Notes (Addendum)
Ryan [name], Ryan [name] ([id number]) Visit Report for 04/25/2020 Arrival Information Details Patient Name: Ryan [name], Ryan [name]. Date of Service: 04/25/2020 2:30 PM Medical Record Number: [id number] Patient Account Number: [id number] Date of Birth/Sex: [birth year] (64 y.o. M) Treating RN: Cornell Barman Primary Care Laterrica Libman: Steele Sizer Other Clinician: Referring Ayani Ospina: Steele Sizer Treating Brienna Bass/Extender: Skipper Cliche in Treatment: 2 Visit Information History Since Last Visit Pain Present Now: Yes Patient Arrived: Ambulatory Arrival Time: 14:36 Accompanied By: self Transfer Assistance: None Patient Identification Verified: Yes Secondary Verification Process Completed: Yes Patient Requires Transmission-Based No Precautions: Patient Has Alerts: Yes Patient Alerts: Patient on Blood Thinner Electronic Signature(s) Signed: 04/26/2020 5:22:48 PM By: Gretta Cool, BSN, RN, CWS, Kim RN, BSN Entered By: Gretta Cool, BSN, RN, CWS, Kim on 04/25/2020 14:36:24 [name] ([id number]) -------------------------------------------------------------------------------- Encounter Discharge Information Details Patient Name: Ryan [name], Ryan [name]. Date of Service: 04/25/2020 2:30 PM Medical Record Number: [id number] Patient Account Number: [id number] Date of Birth/Sex: [birth year] (64 y.o. M) Treating RN: Cornell Barman Primary Care Maecie Sevcik: Steele Sizer Other Clinician: Referring Avrian Delfavero: Steele Sizer Treating Jeanpaul Biehl/Extender: Skipper Cliche in Treatment: 2 Encounter Discharge Information Items Post Procedure Vitals Discharge Condition: Stable Unable to obtain vitals Reason: patient choice. Discharge Destination: Home Transportation: Private Auto Accompanied By: self Schedule Follow-up Appointment: Yes Clinical Summary of Care: Electronic Signature(s) Signed: 04/26/2020 5:22:48 PM By: Gretta Cool, BSN, RN, CWS, Kim RN, BSN Entered By: Gretta Cool, BSN, RN, CWS, Kim on 04/25/2020 15:11:48 [name] ([id number]) -------------------------------------------------------------------------------- Lower Extremity Assessment Details Patient Name: Ryan [name], Ryan [name]. Date of Service: 04/25/2020 2:30 PM Medical Record Number: [id number] Patient Account Number: [id number] Date of Birth/Sex: Sep 28, 1955 (64 y.o. M) Treating RN: Cornell Barman Primary Care Dimitrius Steedman: Steele Sizer Other Clinician: Referring Emelin Dascenzo: Steele Sizer Treating Khylei Wilms/Extender: Jeri Cos Weeks in Treatment: 2 Edema Assessment Assessed: [Left: No] [Right: Yes] Edema: [Left: Ye] [Right: s] Calf Left: Right: Point of Measurement: 32 cm From Medial Instep 26 cm Ankle Left: Right: Point of Measurement: 10 cm From Medial Instep 25.4 cm Vascular Assessment Pulses: Dorsalis Pedis Palpable: [Right:Yes] Electronic Signature(s) Signed: 04/26/2020 5:22:48 PM By: Gretta Cool, BSN, RN, CWS, Kim RN, BSN Entered By: Gretta Cool, BSN, RN, CWS, Kim on 04/25/2020 14:45:52 [name] ([id number]) -------------------------------------------------------------------------------- Multi Wound Chart Details Patient Name: Ryan [name], Ryan [name]. Date of Service: 04/25/2020 2:30 PM Medical Record Number: [id number] Patient Account Number: [id number] Date of Birth/Sex: 1955/10/27 (64 y.o. M) Treating RN: Cornell Barman Primary Care Elner Seifert: Steele Sizer Other Clinician: Referring Jnae Thomaston: Steele Sizer Treating Keilee Denman/Extender: Skipper Cliche in Treatment: 2 Vital Signs Height(in): 83 Pulse(bpm): 100 Weight(lbs): 147 Blood Pressure(mmHg): 150/80 Body Mass Index(BMI): 22 Temperature(F): 97.6 Respiratory Rate(breaths/min): 16 Photos: [N/A:N/A] Wound Location: Right, Lateral Lower Leg N/A N/A Wounding Event: Surgical Injury N/A N/A Primary Etiology: Dehisced Wound N/A N/A Comorbid History: Hypertension, Peripheral Venous N/A N/A Disease Date Acquired: 09/08/2019 N/A N/A Weeks of Treatment: 2 N/A N/A Wound Status:  Open N/A N/A Measurements L x W x D (cm) 15x3x0.4 N/A N/A Area (cm) : 35.343 N/A N/A Volume (cm) : 14.137 N/A N/A % Reduction in Area: -12.50% N/A N/A % Reduction in Volume: -349.90% N/A N/A Classification: Unclassifiable N/A N/A Exudate Amount: Medium N/A N/A Exudate Type: Serous N/A N/A Exudate Color: amber N/A N/A Wound Margin: Flat and Intact N/A N/A Granulation Amount: None Present (0%) N/A N/A Necrotic Amount: Large (67-100%) N/A N/A Necrotic Tissue: Eschar, Adherent Slough N/A N/A Exposed Structures: Fascia: No N/A N/A Fat Layer (Subcutaneous Tissue): No Tendon: No Muscle: No Joint: No Bone: No  Epithelialization: None N/A N/A Treatment Notes Electronic Signature(s) Signed: 04/26/2020 5:22:48 PM By: Gretta Cool, BSN, RN, CWS, Kim RN, BSN Entered By: Gretta Cool, BSN, RN, CWS, Kim on 04/25/2020 14:58:44 [name] ([id number]) -------------------------------------------------------------------------------- [place] Details Patient Name: Ryan [name], Ryan [name]. Date of Service: 04/25/2020 2:30 PM Medical Record Number: [id number] Patient Account Number: [id number] Date of Birth/Sex: Mar 23, 1956 (64 y.o. M) Treating RN: Cornell Barman Primary Care Constance Whittle: Steele Sizer Other Clinician: Referring Kanijah Groseclose: Steele Sizer Treating Lezette Kitts/Extender: Skipper Cliche in Treatment: 2 Active Inactive Electronic Signature(s) Signed: 05/08/2020 10:42:57 AM By: Gretta Cool, BSN, RN, CWS, Kim RN, BSN Previous Signature: 04/26/2020 5:22:48 PM Version By: Gretta Cool, BSN, RN, CWS, Kim RN, BSN Entered By: Gretta Cool, BSN, RN, CWS, Kim on 05/08/2020 10:42:56 Ryan [name], Ryan [name] ([id number]) -------------------------------------------------------------------------------- Pain Assessment Details Patient Name: Ryan [name], Ryan [name]. Date of Service: 04/25/2020 2:30 PM Medical Record Number: [id number] Patient Account Number: [id number] Date of Birth/Sex: 12-12-1955 (64 y.o. M) Treating RN:  Cornell Barman Primary Care Keishaun Hazel: Steele Sizer Other Clinician: Referring Taisha Pennebaker: Steele Sizer Treating Maryela Tapper/Extender: Skipper Cliche in Treatment: 2 Active Problems Location of Pain Severity and Description of Pain Patient Has Paino Yes Site Locations Rate the pain. Current Pain Level: 5 Character of Pain Describe the Pain: Heavy, Sharp, Other: itching Pain Management and Medication Current Pain Management: Electronic Signature(s) Signed: 04/26/2020 5:22:48 PM By: Gretta Cool, BSN, RN, CWS, Kim RN, BSN Entered By: Gretta Cool, BSN, RN, CWS, Kim on 04/25/2020 14:38:18 [name] ([id number]) -------------------------------------------------------------------------------- Patient/Caregiver Education Details Patient Name: Ryan [name], Ryan [name]. Date of Service: 04/25/2020 2:30 PM Medical Record Number: [id number] Patient Account Number: [id number] Date of Birth/Gender: May 06, 1956 (64 y.o. M) Treating RN: Cornell Barman Primary Care Physician: Steele Sizer Other Clinician: Referring Physician: Steele Sizer Treating Physician/Extender: Skipper Cliche in Treatment: 2 Education Assessment Education Provided To: Patient Education Topics Provided Wound/Skin Impairment: Handouts: Caring for Your Ulcer Methods: Demonstration, Explain/Verbal Responses: State content correctly Electronic Signature(s) Signed: 04/26/2020 5:22:48 PM By: Gretta Cool, BSN, RN, CWS, Kim RN, BSN Entered By: Gretta Cool, BSN, RN, CWS, Kim on 04/25/2020 15:10:35 [name] ([id number]) -------------------------------------------------------------------------------- Wound Assessment Details Patient Name: Ryan [name], Ryan [name]. Date of Service: 04/25/2020 2:30 PM Medical Record Number: [id number] Patient Account Number: [id number] Date of Birth/Sex: Mar 26, 1956 (64 y.o. M) Treating RN: Cornell Barman Primary Care Mayda Shippee: Steele Sizer Other Clinician: Referring Cerena Baine: Steele Sizer Treating  Deloyd Handy/Extender: Jeri Cos Weeks in Treatment: 2 Wound Status Wound Number: 1 Primary Etiology: Dehisced Wound Wound Location: Right, Lateral Lower Leg Wound Status: Open Wounding Event: Surgical Injury Comorbid History: Hypertension, Peripheral Venous Disease Date Acquired: 09/08/2019 Weeks Of Treatment: 2 Clustered Wound: No Photos Wound Measurements Length: (cm) 15 Width: (cm) 3 Depth: (cm) 0.4 Area: (cm) 35.343 Volume: (cm) 14.137 % Reduction in Area: -12.5% % Reduction in Volume: -349.9% Epithelialization: None Tunneling: No Undermining: No Wound Description Classification: Unclassifiable Wound Margin: Flat and Intact Exudate Amount: Medium Exudate Type: Serous Exudate Color: amber Foul Odor After Cleansing: No Slough/Fibrino Yes Wound Bed Granulation Amount: None Present (0%) Exposed Structure Necrotic Amount: Large (67-100%) Fascia Exposed: No Necrotic Quality: Eschar, Adherent Slough Fat Layer (Subcutaneous Tissue) Exposed: No Tendon Exposed: No Muscle Exposed: No Joint Exposed: No Bone Exposed: No Electronic Signature(s) Signed: 04/26/2020 5:22:48 PM By: Gretta Cool, BSN, RN, CWS, Kim RN, BSN Entered By: Gretta Cool, BSN, RN, CWS, Kim on 04/25/2020 14:43:51 [name] ([id number]) -------------------------------------------------------------------------------- [place] Details Patient Name: [name]. Date of Service: 04/25/2020 2:30 PM Medical Record Number: [id number] Patient Account Number: [id number] Date of  Birth/Sex: 06/03/1956 (64 y.o. M) Treating RN: Cornell Barman Primary Care Lamonta Cypress: Steele Sizer Other Clinician: Referring Khylin Gutridge: Steele Sizer Treating Vela Render/Extender: Skipper Cliche in Treatment: 2 Vital Signs Time Taken: 14:36 Temperature (F): 97.6 Height (in): 68 Pulse (bpm): 100 Weight (lbs): 147 Respiratory Rate (breaths/min): 16 Body Mass Index (BMI): 22.3 Blood Pressure (mmHg): 150/80 Reference Range: 80 - 120  mg / dl Electronic Signature(s) Signed: 04/26/2020 5:22:48 PM By: Gretta Cool, BSN, RN, CWS, Kim RN, BSN Entered By: Gretta Cool, BSN, RN, CWS, Kim on 04/25/2020 14:37:35

## 2020-04-25 NOTE — Progress Notes (Addendum)
CLEVE, PAOLILLO ([id number]) Visit Report for 04/25/2020 Chief Complaint Document Details Patient Name: Ryan [name], Ryan [name]. Date of Service: 04/25/2020 2:30 PM Medical Record Number: [id number] Patient Account Number: [id number] Date of Birth/Sex: [birth year] (64 y.o. M) Treating RN: Cornell Barman Primary Care Provider: Steele Sizer Other Clinician: Referring Provider: Steele Sizer Treating Provider/Extender: Skipper Cliche in Treatment: 2 Information Obtained from: Patient Chief Complaint Right LE Ulcer Electronic Signature(s) Signed: 04/25/2020 2:35:00 PM By: Worthy Keeler PA-C Entered By: Worthy Keeler on 04/25/2020 14:34:59 Ryan [name] ([id number]) -------------------------------------------------------------------------------- Debridement Details Patient Name: Ryan [name]. Date of Service: 04/25/2020 2:30 PM Medical Record Number: [id number] Patient Account Number: [id number] Date of Birth/Sex: 28-Jun-1955 (64 y.o. M) Treating RN: Cornell Barman Primary Care Provider: Steele Sizer Other Clinician: Referring Provider: Steele Sizer Treating Provider/Extender: Skipper Cliche in Treatment: 2 Debridement Performed for Wound #1 Right,Lateral Lower Leg Assessment: Performed By: Physician Tommie Sams., PA-C Debridement Type: Chemical/Enzymatic/Mechanical Agent Used: Santyl Level of Consciousness (Pre- Awake and Alert procedure): Pre-procedure Verification/Time Out Yes - 15:00 Taken: Instrument: Other : tongue blade Bleeding: None Response to Treatment: Procedure was tolerated well Level of Consciousness (Post- Awake and Alert procedure): Post Debridement Measurements of Total Wound Length: (cm) 15 Width: (cm) 3 Depth: (cm) 0.4 Volume: (cm) 14.137 Character of Wound/Ulcer Post Debridement: Stable Post Procedure Diagnosis Same as Pre-procedure Notes Physician cross-hatched eschar for better medication absorption. Electronic  Signature(s) Signed: 04/25/2020 4:38:36 PM By: Worthy Keeler PA-C Signed: 04/26/2020 5:22:48 PM By: Gretta Cool, BSN, RN, CWS, Kim RN, BSN Entered By: Gretta Cool, BSN, RN, CWS, Kim on 04/25/2020 15:08:09 LEELYNN, WHETSEL ([id number]) -------------------------------------------------------------------------------- HPI Details Patient Name: Ryan [name]. Date of Service: 04/25/2020 2:30 PM Medical Record Number: [id number] Patient Account Number: [id number] Date of Birth/Sex: 1956/03/08 (64 y.o. M) Treating RN: Cornell Barman Primary Care Provider: Steele Sizer Other Clinician: Referring Provider: Steele Sizer Treating Provider/Extender: Skipper Cliche in Treatment: 2 History of Present Illness HPI Description: 04/11/2020 patient presents for initial inspection here in the clinic concerning issues with a wound on the right lateral lower extremity which is actually side of one of his lower extremity bypass surgery locations. Nonetheless he tells me that right now this area has opened up and he is having a lot of trouble he never really had this completely healed. He did see Dr. Gwenlyn Saran who is his vascular doctor unfortunately the patient is going require repeat bypass of the lower extremity he has literally almost no flow here compared to what he should have his ABI 0.18 and TBI at 0 with no dopplerable flow detected. Nonetheless this is not good especially considering he has a wound that obviously is arterial in nature and does not appear to be wanting to heal appropriately. The patient does have hypertension and obviously peripheral vascular disease but is not diabetic he is a former smoker but did quit years ago. Obviously this probably contributed to some of his arterial issues. He is stated to be at high risk for amputation if the second bypass were not to be successful based on what we are seeing today. Obviously he has some swelling as well he likely has some venous disease but the main issue  right now is the arterial side of things. 04/25/2020 on evaluation today patient appears to be doing well currently all things considered with regard to his wound. Is starting to soften up a lot as far as the eschar is concerned which is exactly what we want to see  the Annitta Needs is doing its job. Fortunately there is no evidence of active infection at this time. I do think we may want to crosshatched the eschar to try to help the Santyl soften this up even more quickly going forward. Electronic Signature(s) Signed: 04/25/2020 3:50:15 PM By: Worthy Keeler PA-C Entered By: Worthy Keeler on 04/25/2020 15:50:14 Ryan [name] ([id number]) -------------------------------------------------------------------------------- Physical Exam Details Patient Name: Ryan [name]. Date of Service: 04/25/2020 2:30 PM Medical Record Number: [id number] Patient Account Number: [id number] Date of Birth/Sex: 10-Jan-1956 (64 y.o. M) Treating RN: Cornell Barman Primary Care Provider: Steele Sizer Other Clinician: Referring Provider: Steele Sizer Treating Provider/Extender: Skipper Cliche in Treatment: 2 Constitutional Well-nourished and well-hydrated in no acute distress. Respiratory normal breathing without difficulty. Psychiatric this patient is able to make decisions and demonstrates good insight into disease process. Alert and Oriented x 3. pleasant and cooperative. Notes Upon inspection patient's wound again showed signs of eschar noted at this time fortunately there is no evidence of active infection overall I am very pleased with where things stand. I think that the biggest issue here is his arterial flow he will be seeing his vascular surgeon Dr. Gwenlyn Saran in roughly 10 days Friday after next. Electronic Signature(s) Signed: 04/25/2020 3:50:38 PM By: Worthy Keeler PA-C Entered By: Worthy Keeler on 04/25/2020 15:50:37 Ryan [name]  ([id number]) -------------------------------------------------------------------------------- Physician Orders Details Patient Name: Ryan [name]. Date of Service: 04/25/2020 2:30 PM Medical Record Number: [id number] Patient Account Number: [id number] Date of Birth/Sex: [birth year] (64 y.o. M) Treating RN: Cornell Barman Primary Care Provider: Steele Sizer Other Clinician: Referring Provider: Steele Sizer Treating Provider/Extender: Skipper Cliche in Treatment: 2 Verbal / Phone Orders: No Diagnosis Coding ICD-10 Coding Code Description T81.31XA Disruption of external operation (surgical) wound, not elsewhere classified, initial encounter L97.812 Non-pressure chronic ulcer of other part of right lower leg with fat layer exposed I73.89 Other specified peripheral vascular diseases I10 Essential (primary) hypertension Wound Cleansing Wound #1 Right,Lateral Lower Leg o Clean wound with Normal Saline. Anesthetic (add to Medication List) Wound #1 Right,Lateral Lower Leg o Topical Lidocaine 4% cream applied to wound bed prior to debridement (In Clinic Only). Primary Wound Dressing Wound #1 Right,Lateral Lower Leg o Santyl Ointment Secondary Dressing Wound #1 Right,Lateral Lower Leg o ABD and Kerlix/Conform o Saline moistened gauze Dressing Change Frequency Wound #1 Right,Lateral Lower Leg o Change dressing every day. Follow-up Appointments Wound #1 Right,Lateral Lower Leg o Return Appointment in 2 weeks. Edema Control Wound #1 Right,Lateral Lower Leg o Elevate legs to the level of the heart and pump ankles as often as possible Additional Orders / Instructions Wound #1 Right,Lateral Lower Leg o Increase protein intake. Medications-please add to medication list. Wound #1 Right,Lateral Lower Leg o Santyl Enzymatic Ointment Notes Patient has follow up appointment with Vascular/Dr,.Cain on 05/05/2020. FAUSTO, SAMPEDRO ([id number]) Electronic  Signature(s) Signed: 04/25/2020 4:38:36 PM By: Worthy Keeler PA-C Signed: 04/26/2020 5:22:48 PM By: Gretta Cool, BSN, RN, CWS, Kim RN, BSN Entered By: Gretta Cool, BSN, RN, CWS, Kim on 04/25/2020 15:00:59 CORDAY, WYKA ([id number]) -------------------------------------------------------------------------------- Problem List Details Patient Name: HADDON, FYFE. Date of Service: 04/25/2020 2:30 PM Medical Record Number: [id number] Patient Account Number: [id number] Date of Birth/Sex: 18-Jun-1956 (64 y.o. M) Treating RN: Cornell Barman Primary Care Provider: Steele Sizer Other Clinician: Referring Provider: Steele Sizer Treating Provider/Extender: Skipper Cliche in Treatment: 2 Active Problems ICD-10 Encounter Code Description Active Date MDM Diagnosis T81.31XA Disruption of external operation (surgical) wound, not elsewhere  04/11/2020 No Yes classified, initial encounter L97.812 Non-pressure chronic ulcer of other part of right lower leg with fat layer 04/11/2020 No Yes exposed I73.89 Other specified peripheral vascular diseases 04/11/2020 No Yes I10 Essential (primary) hypertension 04/11/2020 No Yes Inactive Problems Resolved Problems Electronic Signature(s) Signed: 04/25/2020 2:34:45 PM By: Worthy Keeler PA-C Entered By: Worthy Keeler on 04/25/2020 14:34:44 Ryan [name] ([id number]) -------------------------------------------------------------------------------- Progress Note Details Patient Name: Ryan [name]. Date of Service: 04/25/2020 2:30 PM Medical Record Number: [id number] Patient Account Number: [id number] Date of Birth/Sex: [birth year] (64 y.o. M) Treating RN: Cornell Barman Primary Care Provider: Steele Sizer Other Clinician: Referring Provider: Steele Sizer Treating Provider/Extender: Skipper Cliche in Treatment: 2 Subjective Chief Complaint Information obtained from Patient Right LE Ulcer History of Present Illness (HPI) 04/11/2020 patient  presents for initial inspection here in the clinic concerning issues with a wound on the right lateral lower extremity which is actually side of one of his lower extremity bypass surgery locations. Nonetheless he tells me that right now this area has opened up and he is having a lot of trouble he never really had this completely healed. He did see Dr. Gwenlyn Saran who is his vascular doctor unfortunately the patient is going require repeat bypass of the lower extremity he has literally almost no flow here compared to what he should have his ABI 0.18 and TBI at 0 with no dopplerable flow detected. Nonetheless this is not good especially considering he has a wound that obviously is arterial in nature and does not appear to be wanting to heal appropriately. The patient does have hypertension and obviously peripheral vascular disease but is not diabetic he is a former smoker but did quit years ago. Obviously this probably contributed to some of his arterial issues. He is stated to be at high risk for amputation if the second bypass were not to be successful based on what we are seeing today. Obviously he has some swelling as well he likely has some venous disease but the main issue right now is the arterial side of things. 04/25/2020 on evaluation today patient appears to be doing well currently all things considered with regard to his wound. Is starting to soften up a lot as far as the eschar is concerned which is exactly what we want to see the Annitta Needs is doing its job. Fortunately there is no evidence of active infection at this time. I do think we may want to crosshatched the eschar to try to help the Santyl soften this up even more quickly going forward. Objective Constitutional Well-nourished and well-hydrated in no acute distress. Vitals Time Taken: 2:36 PM, Height: 68 in, Weight: 147 lbs, BMI: 22.3, Temperature: 97.6 F, Pulse: 100 bpm, Respiratory Rate: 16 breaths/min, Blood Pressure: 150/80  mmHg. Respiratory normal breathing without difficulty. Psychiatric this patient is able to make decisions and demonstrates good insight into disease process. Alert and Oriented x 3. pleasant and cooperative. General Notes: Upon inspection patient's wound again showed signs of eschar noted at this time fortunately there is no evidence of active infection overall I am very pleased with where things stand. I think that the biggest issue here is his arterial flow he will be seeing his vascular surgeon Dr. Gwenlyn Saran in roughly 10 days Friday after next. Integumentary (Hair, Skin) Wound #1 status is Open. Original cause of wound was Surgical Injury. The wound is located on the Right,Lateral Lower Leg. The wound measures 15cm length x 3cm width x 0.4cm depth; 35.343cm^2 area and  14.137cm^3 volume. There is no tunneling or undermining noted. There is a medium amount of serous drainage noted. The wound margin is flat and intact. There is no granulation within the wound bed. There is a large (67-100%) amount of necrotic tissue within the wound bed including Eschar and Adherent Slough. Assessment Active Problems ICD-10 Disruption of external operation (surgical) wound, not elsewhere classified, initial encounter PRIMITIVO, MERKEY. ([id number]) Non-pressure chronic ulcer of other part of right lower leg with fat layer exposed Other specified peripheral vascular diseases Essential (primary) hypertension Procedures Wound #1 Pre-procedure diagnosis of Wound #1 is a Dehisced Wound located on the Right,Lateral Lower Leg . There was a Chemical/Enzymatic/Mechanical debridement performed by Tommie Sams., PA-C. With the following instrument(s): tongue blade. Agent used was [employer]. A time out was conducted at 15:00, prior to the start of the procedure. There was no bleeding. The procedure was tolerated well. Post Debridement Measurements: 15cm length x 3cm width x 0.4cm depth; 14.137cm^3 volume. Character of  Wound/Ulcer Post Debridement is stable. Post procedure Diagnosis Wound #1: Same as Pre-Procedure General Notes: Physician cross-hatched eschar for better medication absorption.. Plan Wound Cleansing: Wound #1 Right,Lateral Lower Leg: Clean wound with Normal Saline. Anesthetic (add to Medication List): Wound #1 Right,Lateral Lower Leg: Topical Lidocaine 4% cream applied to wound bed prior to debridement (In Clinic Only). Primary Wound Dressing: Wound #1 Right,Lateral Lower Leg: Santyl Ointment Secondary Dressing: Wound #1 Right,Lateral Lower Leg: ABD and Kerlix/Conform Saline moistened gauze Dressing Change Frequency: Wound #1 Right,Lateral Lower Leg: Change dressing every day. Follow-up Appointments: Wound #1 Right,Lateral Lower Leg: Return Appointment in 2 weeks. Edema Control: Wound #1 Right,Lateral Lower Leg: Elevate legs to the level of the heart and pump ankles as often as possible Additional Orders / Instructions: Wound #1 Right,Lateral Lower Leg: Increase protein intake. Medications-please add to medication list.: Wound #1 Right,Lateral Lower Leg: Santyl Enzymatic Ointment General Notes: Patient has follow up appointment with Vascular/Dr,.Cain on 05/05/2020. 1. Would recommend currently that we continue with the Santyl I think this is still the best option for the patient at this point and the patient is in agreement with plan. 2. I am also can recommend at this time that the patient continue to monitor for any signs of worsening infection obviously he notices any abnormal or increased purulent drainage she should let me know. Otherwise the biggest issue has had his pain which again I think is more arterial in nature. We will see patient back for reevaluation in 2 weeks here in the clinic. If anything worsens or changes patient will contact our office for additional recommendations. Electronic Signature(s) Signed: 04/25/2020 3:51:07 PM By: Worthy Keeler PA-C Entered  By: Worthy Keeler on 04/25/2020 15:51:06 MUJAHID, JALOMO ([id number], ERGLE ([id number]) -------------------------------------------------------------------------------- SuperBill Details Patient Name: TRESON, LAURA. Date of Service: 04/25/2020 Medical Record Number: [id number] Patient Account Number: [id number] Date of Birth/Sex: 1956-03-28 (64 y.o. M) Treating RN: Cornell Barman Primary Care Provider: Steele Sizer Other Clinician: Referring Provider: Steele Sizer Treating Provider/Extender: Skipper Cliche in Treatment: 2 Diagnosis Coding ICD-10 Codes Code Description T81.31XA Disruption of external operation (surgical) wound, not elsewhere classified, initial encounter L97.812 Non-pressure chronic ulcer of other part of right lower leg with fat layer exposed I73.89 Other specified peripheral vascular diseases I10 Essential (primary) hypertension Facility Procedures CPT4 Code: 10258527 Description: 78242 - DEBRIDE W/O ANES NON SELECT Modifier: Quantity: 1 Physician Procedures CPT4 Code: 3536144 Description: 99214 - WC PHYS LEVEL 4 - EST PT Modifier: Quantity:  1 CPT4 Code: Description: ICD-10 Diagnosis Description T81.31XA Disruption of external operation (surgical) wound, not elsewhere classifi L97.812 Non-pressure chronic ulcer of other part of right lower leg with fat laye I73.89 Other specified peripheral vascular  diseases I10 Essential (primary) hypertension Modifier: ed, initial encounter r exposed Quantity: Electronic Signature(s) Signed: 04/25/2020 3:51:22 PM By: Worthy Keeler PA-C Entered By: Worthy Keeler on 04/25/2020 15:51:21

## 2020-04-27 ENCOUNTER — Other Ambulatory Visit: Payer: Self-pay

## 2020-04-27 DIAGNOSIS — I998 Other disorder of circulatory system: Secondary | ICD-10-CM

## 2020-05-05 ENCOUNTER — Encounter (HOSPITAL_COMMUNITY): Payer: Self-pay | Admitting: Vascular Surgery

## 2020-05-05 ENCOUNTER — Encounter: Payer: 59 | Admitting: Vascular Surgery

## 2020-05-05 ENCOUNTER — Encounter: Payer: Self-pay | Admitting: Vascular Surgery

## 2020-05-05 ENCOUNTER — Encounter (HOSPITAL_COMMUNITY): Payer: 59

## 2020-05-05 ENCOUNTER — Other Ambulatory Visit: Payer: Self-pay

## 2020-05-05 ENCOUNTER — Ambulatory Visit (INDEPENDENT_AMBULATORY_CARE_PROVIDER_SITE_OTHER)
Admit: 2020-05-05 | Discharge: 2020-05-05 | Disposition: A | Discharge status: Home / Self Care | Payer: 59 | Attending: Vascular Surgery | Admitting: Vascular Surgery

## 2020-05-05 ENCOUNTER — Ambulatory Visit (INDEPENDENT_AMBULATORY_CARE_PROVIDER_SITE_OTHER): Payer: 59 | Admitting: Vascular Surgery

## 2020-05-05 ENCOUNTER — Ambulatory Visit (HOSPITAL_COMMUNITY)
Admission: RE | Admit: 2020-05-05 | Discharge: 2020-05-05 | Disposition: A | Discharge status: Home / Self Care | Payer: 59 | Source: Ambulatory Visit | Attending: Vascular Surgery | Admitting: Vascular Surgery

## 2020-05-05 VITALS — BP 138/95 | HR 99 | Temp 97.4°F | Resp 20 | Ht 68.0 in | Wt 147.0 lb

## 2020-05-05 DIAGNOSIS — I998 Other disorder of circulatory system: Secondary | ICD-10-CM | POA: Diagnosis not present

## 2020-05-05 DIAGNOSIS — T8189XD Other complications of procedures, not elsewhere classified, subsequent encounter: Secondary | ICD-10-CM | POA: Diagnosis not present

## 2020-05-05 NOTE — Progress Notes (Signed)
PCP - Dr Ancil Boozer Cardiologist - n/a  Chest x-ray - 12/06/19 (2V), CT Chest 03/16/20 EKG - 12/07/19 Stress Test - n/a ECHO - n/a Cardiac Cath - n/a  Anesthesia review: Yes  STOP now taking any Aspirin (unless otherwise instructed by your surgeon), Aleve, Naproxen, Ibuprofen, Motrin, Advil, Goody's, BC's, all herbal medications, fish oil, and all vitamins.   Coronavirus Screening Covid test scheduled 05/06/20 Do you have any of the following symptoms:  Cough yes/no: No Fever (>100.23F)  yes/no: No Runny nose yes/no: No Sore throat yes/no: No Difficulty breathing/shortness of breath  yes/no: No  Have you traveled in the last 14 days and where? yes/no: No  Patient verbalized understanding of instructions that were given via phone.

## 2020-05-05 NOTE — H&P (View-Only) (Signed)
Patient ID: Ryan [name], male   DOB: September 05, 1955, 64 y.o.   MRN: [id number]  Reason for Consult: Post-op Follow-up   Referred by Steele Sizer, MD  Subjective:     HPI:  Ryan [name] is a 64 y.o. male has history of extensive stenting of his right lower extremity performed at outside institutions.  I then performed a right anterior tibial artery bypass with composite graft and vein and common femoral endarterectomy with retrograde stenting of his external iliac artery.  Subsequently this has occluded.  He has a wound on his right leg which is failed to heal this is now necrotic.  Previously was placed on antibiotics not on them now.  Denies any fevers.  He has severe pain in his right lower extremity.  We recently performed angiography with stent of his left external iliac artery but no intervention was undertaken on the right.  He is here today to discuss amputation versus repeat bypass.  Past Medical History:  Diagnosis Date  . Hyperlipidemia   . Hypertension   . Neuromuscular disorder (HCC)    numbness feet  . Peripheral vascular disease ([place])   . Shortness of breath dyspnea    Family History  Problem Relation Age of Onset  . COPD Mother   . Hypertension Father   . Heart attack Brother    Past Surgical History:  Procedure Laterality Date  . ABDOMINAL AORTOGRAM W/LOWER EXTREMITY N/A 09/13/2019   Procedure: ABDOMINAL AORTOGRAM W/LOWER EXTREMITY;  Surgeon: Waynetta Sandy, MD;  Location: [place] [facility];  Service: Cardiovascular;  Laterality: N/A;  . COLONOSCOPY WITH PROPOFOL N/A 02/15/2016   Procedure: COLONOSCOPY WITH PROPOFOL;  Surgeon: Lucilla Lame, MD;  Location: [place];  Service: Endoscopy;  Laterality: N/A;  . FEMORAL-POPLITEAL BYPASS GRAFT Right 09/14/2019   Procedure: BYPASS GRAFT FEMORAL-POPLITEAL ARTERY;  Surgeon: Waynetta Sandy, MD;  Location: [place];  Service: Vascular;  Laterality: Right;  . HERNIA REPAIR  1999   left  inguinal  . INSERTION OF ILIAC STENT Right 09/14/2019   Procedure: Insertion Of Common and External Iliac Stent;  Surgeon: Waynetta Sandy, MD;  Location: [place];  Service: Vascular;  Laterality: Right;  . LOWER EXTREMITY ANGIOGRAPHY Right 05/07/2018   Procedure: LOWER EXTREMITY ANGIOGRAPHY;  Surgeon: Algernon Huxley, MD;  Location: [place] [facility];  Service: Cardiovascular;  Laterality: Right;  . LOWER EXTREMITY ANGIOGRAPHY Right 06/25/2018   Procedure: LOWER EXTREMITY ANGIOGRAPHY;  Surgeon: Algernon Huxley, MD;  Location: [place] [facility];  Service: Cardiovascular;  Laterality: Right;  . LOWER EXTREMITY ANGIOGRAPHY Right 07/01/2018   Procedure: LOWER EXTREMITY ANGIOGRAPHY;  Surgeon: Algernon Huxley, MD;  Location: [place] [facility];  Service: Cardiovascular;  Laterality: Right;  . LOWER EXTREMITY ANGIOGRAPHY Right 08/12/2018   Procedure: LOWER EXTREMITY ANGIOGRAPHY;  Surgeon: Algernon Huxley, MD;  Location: [place] [facility];  Service: Cardiovascular;  Laterality: Right;  . LOWER EXTREMITY ANGIOGRAPHY Right 09/03/2018   Procedure: LOWER EXTREMITY ANGIOGRAPHY;  Surgeon: Algernon Huxley, MD;  Location: [place] [facility];  Service: Cardiovascular;  Laterality: Right;  . LOWER EXTREMITY ANGIOGRAPHY Right 09/04/2018   Procedure: Lower Extremity Angiography;  Surgeon: Algernon Huxley, MD;  Location: [place] [facility];  Service: Cardiovascular;  Laterality: Right;  . LOWER EXTREMITY ANGIOGRAPHY Right 10/01/2018   Procedure: LOWER EXTREMITY ANGIOGRAPHY;  Surgeon: Algernon Huxley, MD;  Location: [place] [facility];  Service: Cardiovascular;  Laterality: Right;  . LOWER EXTREMITY ANGIOGRAPHY Right  10/01/2018   Procedure: Lower Extremity Angiography;  Surgeon: Algernon Huxley, MD;  Location: [place] [facility];  Service: Cardiovascular;  Laterality: Right;  . LOWER EXTREMITY ANGIOGRAPHY Right 10/15/2018   Procedure: LOWER EXTREMITY ANGIOGRAPHY;  Surgeon: Algernon Huxley, MD;  Location: [place] [facility];   Service: Cardiovascular;  Laterality: Right;  . LOWER EXTREMITY ANGIOGRAPHY Left 10/16/2018   Procedure: Lower Extremity Angiography;  Surgeon: Algernon Huxley, MD;  Location: [place] [facility];  Service: Cardiovascular;  Laterality: Left;  . LOWER EXTREMITY ANGIOGRAPHY Left 01/20/2019   Procedure: LOWER EXTREMITY ANGIOGRAPHY;  Surgeon: Algernon Huxley, MD;  Location: [place] [facility];  Service: Cardiovascular;  Laterality: Left;  . LOWER EXTREMITY ANGIOGRAPHY Right 01/21/2019   Procedure: Lower Extremity Angiography;  Surgeon: Algernon Huxley, MD;  Location: [place] [facility];  Service: Cardiovascular;  Laterality: Right;  . LOWER EXTREMITY ANGIOGRAPHY Right 01/28/2019   Procedure: LOWER EXTREMITY ANGIOGRAPHY;  Surgeon: Algernon Huxley, MD;  Location: [place] [facility];  Service: Cardiovascular;  Laterality: Right;  . LOWER EXTREMITY ANGIOGRAPHY Right 01/29/2019   Procedure: Lower Extremity Angiography;  Surgeon: Algernon Huxley, MD;  Location: [place] [facility];  Service: Cardiovascular;  Laterality: Right;  . LOWER EXTREMITY ANGIOGRAPHY Right 04/04/2020   Procedure: LOWER EXTREMITY ANGIOGRAPHY;  Surgeon: Waynetta Sandy, MD;  Location: [place] [facility];  Service: Cardiovascular;  Laterality: Right;  . LOWER EXTREMITY INTERVENTION Right 07/02/2018   Procedure: LOWER EXTREMITY INTERVENTION;  Surgeon: Algernon Huxley, MD;  Location: [place] [facility];  Service: Cardiovascular;  Laterality: Right;  . PERIPHERAL VASCULAR BALLOON ANGIOPLASTY Left 04/04/2020   Procedure: PERIPHERAL VASCULAR BALLOON ANGIOPLASTY;  Surgeon: Waynetta Sandy, MD;  Location: [place] [facility];  Service: Cardiovascular;  Laterality: Left;  external iliac  . POLYPECTOMY N/A 02/15/2016   Procedure: POLYPECTOMY;  Surgeon: Lucilla Lame, MD;  Location: [place];  Service: Endoscopy;  Laterality: N/A;    Short Social History:  Social History   Tobacco Use  . Smoking status: Former Smoker     Packs/day: 0.50    Years: 40.00    Pack years: 20.00    Types: Cigarettes    Start date: 07/21/1978    Quit date: 12/2018    Years since quitting: 1.3  . Smokeless tobacco: Never Used  Substance Use Topics  . Alcohol use: Yes    Alcohol/week: 12.0 standard drinks    Types: 12 Cans of beer per week    No Known Allergies  Current Outpatient Medications  Medication Sig Dispense Refill  . acetaminophen (TYLENOL) 500 MG tablet Take 1,000 mg by mouth every 6 (six) hours as needed for moderate pain or headache.    . albuterol (VENTOLIN HFA) 108 (90 Base) MCG/ACT inhaler Inhale 2 puffs into the lungs every 6 (six) hours as needed for wheezing or shortness of breath. 18 g 0  . amLODipine-valsartan (EXFORGE) 5-160 MG tablet Take 1 tablet by mouth daily. In place of amlodipine 5 mg (Patient taking differently: Take 1 tablet by mouth daily. ) 90 tablet 0  . aspirin EC 81 MG tablet Take 1 tablet (81 mg total) by mouth daily. 90 tablet 3  . atorvastatin (LIPITOR) 20 MG tablet Take 1 tablet (20 mg total) by mouth daily. 90 tablet 1  . Cholecalciferol (DIALYVITE VITAMIN D 5000) 125 MCG (5000 UT) capsule Take 5,000 Units by mouth daily.    . clopidogrel (PLAVIX) 75 MG tablet Take 75 mg by mouth  daily.    . Cyanocobalamin (B-12) 500 MCG SUBL Place 1 tablet under the tongue daily. 30 tablet 0  . Fluticasone-Umeclidin-Vilant (TRELEGY ELLIPTA) 100-62.5-25 MCG/INH AEPB Inhale 1 puff into the lungs daily as needed (shortness of breath). 60 each 5  . SANTYL ointment Apply topically.    . thiamine (VITAMIN B-1) 50 MG tablet Take 1 tablet (50 mg total) by mouth daily. 30 tablet 2   No current facility-administered medications for this visit.    Review of Systems  Constitutional:  Constitutional negative.Negative for fever.  HENT: HENT negative.  Eyes: Eyes negative.  Respiratory: Respiratory negative.  Cardiovascular: Cardiovascular negative.  GI: Gastrointestinal negative.  Musculoskeletal: Positive  for gait problem and leg pain.  Skin: Positive for wound.  Hematologic: Hematologic/lymphatic negative.  Psychiatric: Psychiatric negative.        Objective:  Objective   Vitals:   05/05/20 1034  BP: (!) 138/95  Pulse: 99  Resp: 20  Temp: (!) 97.4 F (36.3 C)  SpO2: 97%  Weight: 147 lb (66.7 kg)  Height: 5\' 8"  (1.727 m)   Body mass index is 22.35 kg/m.  Physical Exam HENT:     Head: Normocephalic.     Nose:     Comments: Wearing a mask Cardiovascular:     Rate and Rhythm: Normal rate.     Pulses:          Femoral pulses are 2+ on the right side and 2+ on the left side.    Comments: I cannot find any signals at his ankle on the right Pulmonary:     Effort: Pulmonary effort is normal.  Abdominal:     General: Abdomen is flat.     Palpations: Abdomen is soft.  Skin:    Comments: There is a large wound on the lateral anterior aspect of the right leg with necrosis.  This measures approximately 5 x 10 cm appears to be deep into the muscle  Neurological:     General: No focal deficit present.     Mental Status: He is alert.  Psychiatric:        Mood and Affect: Mood normal.        Behavior: Behavior normal.        Thought Content: Thought content normal.        Judgment: Judgment normal.     Data: Vein mapping +--------------+----------------+--------------------+-------------+-------  ----+   RT Diameter  RT Findings      GSV      LT Diameter LT  Findings     (cm)                       (cm)           +--------------+----------------+--------------------+-------------+-------  ----+           No great    Saphenofemoral    0.38                   saphenous vein    Junction                          visualized.                           +--------------+----------------+--------------------+-------------+-------   ----+                   Proximal thigh    0.25           +--------------+----------------+--------------------+-------------+-------  ----+  Mid thigh     0.25           +--------------+----------------+--------------------+-------------+-------  ----+                   Distal thigh    0.38           +--------------+----------------+--------------------+-------------+-------  ----+                     Knee      0.32           +--------------+----------------+--------------------+-------------+-------  ----+                    Prox calf     0.27           +--------------+----------------+--------------------+-------------+-------  ----+                    Mid calf     0.23           +--------------+----------------+--------------------+-------------+-------  ----+                   Distal calf     0.20           +--------------+----------------+--------------------+-------------+-------  ----+    Right Tech Comments:  Summary:    Right: No great saphenous vein visualized.  Left: Patent great saphenous vein with minimal branching.       Assessment/Plan:     64 year old male with the above-noted procedures.  Unfortunate this time I do not think his right lower extremity is salvageable.  We did discuss possibly for him to a peroneal artery bypass we could not rebypass the anterior tibial artery given the location of the wound.  I am also concerned that he could infect his previous bypass graft.  Skin below the knee actually looks quite healthy I think we can begin with below-knee amputation.  Patient with severe disability at this time has agreed for below-knee amputation.  He understands that he will need rehab  at least 3 months prior to prosthesis.  We will get him scheduled early next week.     Waynetta Sandy MD [facility]

## 2020-05-05 NOTE — Progress Notes (Signed)
Patient ID: Ryan [name], male   DOB: December 07, 1955, 64 y.o.   MRN: [id number]  Reason for Consult: Post-op Follow-up   Referred by Steele Sizer, MD  Subjective:     HPI:  Ryan [name] is a 64 y.o. male has history of extensive stenting of his right lower extremity performed at outside institutions.  I then performed a right anterior tibial artery bypass with composite graft and vein and common femoral endarterectomy with retrograde stenting of his external iliac artery.  Subsequently this has occluded.  He has a wound on his right leg which is failed to heal this is now necrotic.  Previously was placed on antibiotics not on them now.  Denies any fevers.  He has severe pain in his right lower extremity.  We recently performed angiography with stent of his left external iliac artery but no intervention was undertaken on the right.  He is here today to discuss amputation versus repeat bypass.  Past Medical History:  Diagnosis Date  . Hyperlipidemia   . Hypertension   . Neuromuscular disorder (HCC)    numbness feet  . Peripheral vascular disease ([place])   . Shortness of breath dyspnea    Family History  Problem Relation Age of Onset  . COPD Mother   . Hypertension Father   . Heart attack Brother    Past Surgical History:  Procedure Laterality Date  . ABDOMINAL AORTOGRAM W/LOWER EXTREMITY N/A 09/13/2019   Procedure: ABDOMINAL AORTOGRAM W/LOWER EXTREMITY;  Surgeon: Waynetta Sandy, MD;  Location: [place] [facility];  Service: Cardiovascular;  Laterality: N/A;  . COLONOSCOPY WITH PROPOFOL N/A 02/15/2016   Procedure: COLONOSCOPY WITH PROPOFOL;  Surgeon: Lucilla Lame, MD;  Location: [place];  Service: Endoscopy;  Laterality: N/A;  . FEMORAL-POPLITEAL BYPASS GRAFT Right 09/14/2019   Procedure: BYPASS GRAFT FEMORAL-POPLITEAL ARTERY;  Surgeon: Waynetta Sandy, MD;  Location: [place];  Service: Vascular;  Laterality: Right;  . HERNIA REPAIR  1999   left  inguinal  . INSERTION OF ILIAC STENT Right 09/14/2019   Procedure: Insertion Of Common and External Iliac Stent;  Surgeon: Waynetta Sandy, MD;  Location: [place];  Service: Vascular;  Laterality: Right;  . LOWER EXTREMITY ANGIOGRAPHY Right 05/07/2018   Procedure: LOWER EXTREMITY ANGIOGRAPHY;  Surgeon: Algernon Huxley, MD;  Location: [place] [facility];  Service: Cardiovascular;  Laterality: Right;  . LOWER EXTREMITY ANGIOGRAPHY Right 06/25/2018   Procedure: LOWER EXTREMITY ANGIOGRAPHY;  Surgeon: Algernon Huxley, MD;  Location: [place] [facility];  Service: Cardiovascular;  Laterality: Right;  . LOWER EXTREMITY ANGIOGRAPHY Right 07/01/2018   Procedure: LOWER EXTREMITY ANGIOGRAPHY;  Surgeon: Algernon Huxley, MD;  Location: [place] [facility];  Service: Cardiovascular;  Laterality: Right;  . LOWER EXTREMITY ANGIOGRAPHY Right 08/12/2018   Procedure: LOWER EXTREMITY ANGIOGRAPHY;  Surgeon: Algernon Huxley, MD;  Location: [name] [facility];  Service: Cardiovascular;  Laterality: Right;  . LOWER EXTREMITY ANGIOGRAPHY Right 09/03/2018   Procedure: LOWER EXTREMITY ANGIOGRAPHY;  Surgeon: Algernon Huxley, MD;  Location: [place] [facility];  Service: Cardiovascular;  Laterality: Right;  . LOWER EXTREMITY ANGIOGRAPHY Right 09/04/2018   Procedure: Lower Extremity Angiography;  Surgeon: Algernon Huxley, MD;  Location: [place] [facility];  Service: Cardiovascular;  Laterality: Right;  . LOWER EXTREMITY ANGIOGRAPHY Right 10/01/2018   Procedure: LOWER EXTREMITY ANGIOGRAPHY;  Surgeon: Algernon Huxley, MD;  Location: [place] [facility];  Service: Cardiovascular;  Laterality: Right;  . LOWER EXTREMITY ANGIOGRAPHY Right  10/01/2018   Procedure: Lower Extremity Angiography;  Surgeon: Algernon Huxley, MD;  Location: [place] [facility];  Service: Cardiovascular;  Laterality: Right;  . LOWER EXTREMITY ANGIOGRAPHY Right 10/15/2018   Procedure: LOWER EXTREMITY ANGIOGRAPHY;  Surgeon: Algernon Huxley, MD;  Location: [place] [facility];   Service: Cardiovascular;  Laterality: Right;  . LOWER EXTREMITY ANGIOGRAPHY Left 10/16/2018   Procedure: Lower Extremity Angiography;  Surgeon: Algernon Huxley, MD;  Location: [place] [facility];  Service: Cardiovascular;  Laterality: Left;  . LOWER EXTREMITY ANGIOGRAPHY Left 01/20/2019   Procedure: LOWER EXTREMITY ANGIOGRAPHY;  Surgeon: Algernon Huxley, MD;  Location: [place] [facility];  Service: Cardiovascular;  Laterality: Left;  . LOWER EXTREMITY ANGIOGRAPHY Right 01/21/2019   Procedure: Lower Extremity Angiography;  Surgeon: Algernon Huxley, MD;  Location: [place] [facility];  Service: Cardiovascular;  Laterality: Right;  . LOWER EXTREMITY ANGIOGRAPHY Right 01/28/2019   Procedure: LOWER EXTREMITY ANGIOGRAPHY;  Surgeon: Algernon Huxley, MD;  Location: [place] [facility];  Service: Cardiovascular;  Laterality: Right;  . LOWER EXTREMITY ANGIOGRAPHY Right 01/29/2019   Procedure: Lower Extremity Angiography;  Surgeon: Algernon Huxley, MD;  Location: [place] [facility];  Service: Cardiovascular;  Laterality: Right;  . LOWER EXTREMITY ANGIOGRAPHY Right 04/04/2020   Procedure: LOWER EXTREMITY ANGIOGRAPHY;  Surgeon: Waynetta Sandy, MD;  Location: [place] [facility];  Service: Cardiovascular;  Laterality: Right;  . LOWER EXTREMITY INTERVENTION Right 07/02/2018   Procedure: LOWER EXTREMITY INTERVENTION;  Surgeon: Algernon Huxley, MD;  Location: [place] [facility];  Service: Cardiovascular;  Laterality: Right;  . PERIPHERAL VASCULAR BALLOON ANGIOPLASTY Left 04/04/2020   Procedure: PERIPHERAL VASCULAR BALLOON ANGIOPLASTY;  Surgeon: Waynetta Sandy, MD;  Location: [place] [facility];  Service: Cardiovascular;  Laterality: Left;  external iliac  . POLYPECTOMY N/A 02/15/2016   Procedure: POLYPECTOMY;  Surgeon: Lucilla Lame, MD;  Location: [place];  Service: Endoscopy;  Laterality: N/A;    Short Social History:  Social History   Tobacco Use  . Smoking status: Former Smoker     Packs/day: 0.50    Years: 40.00    Pack years: 20.00    Types: Cigarettes    Start date: 07/21/1978    Quit date: 12/2018    Years since quitting: 1.3  . Smokeless tobacco: Never Used  Substance Use Topics  . Alcohol use: Yes    Alcohol/week: 12.0 standard drinks    Types: 12 Cans of beer per week    No Known Allergies  Current Outpatient Medications  Medication Sig Dispense Refill  . acetaminophen (TYLENOL) 500 MG tablet Take 1,000 mg by mouth every 6 (six) hours as needed for moderate pain or headache.    . albuterol (VENTOLIN HFA) 108 (90 Base) MCG/ACT inhaler Inhale 2 puffs into the lungs every 6 (six) hours as needed for wheezing or shortness of breath. 18 g 0  . amLODipine-valsartan (EXFORGE) 5-160 MG tablet Take 1 tablet by mouth daily. In place of amlodipine 5 mg (Patient taking differently: Take 1 tablet by mouth daily. ) 90 tablet 0  . aspirin EC 81 MG tablet Take 1 tablet (81 mg total) by mouth daily. 90 tablet 3  . atorvastatin (LIPITOR) 20 MG tablet Take 1 tablet (20 mg total) by mouth daily. 90 tablet 1  . Cholecalciferol (DIALYVITE VITAMIN D 5000) 125 MCG (5000 UT) capsule Take 5,000 Units by mouth daily.    . clopidogrel (PLAVIX) 75 MG tablet Take 75 mg by mouth  daily.    . Cyanocobalamin (B-12) 500 MCG SUBL Place 1 tablet under the tongue daily. 30 tablet 0  . Fluticasone-Umeclidin-Vilant (TRELEGY ELLIPTA) 100-62.5-25 MCG/INH AEPB Inhale 1 puff into the lungs daily as needed (shortness of breath). 60 each 5  . SANTYL ointment Apply topically.    . thiamine (VITAMIN B-1) 50 MG tablet Take 1 tablet (50 mg total) by mouth daily. 30 tablet 2   No current facility-administered medications for this visit.    Review of Systems  Constitutional:  Constitutional negative.Negative for fever.  HENT: HENT negative.  Eyes: Eyes negative.  Respiratory: Respiratory negative.  Cardiovascular: Cardiovascular negative.  GI: Gastrointestinal negative.  Musculoskeletal: Positive  for gait problem and leg pain.  Skin: Positive for wound.  Hematologic: Hematologic/lymphatic negative.  Psychiatric: Psychiatric negative.        Objective:  Objective   Vitals:   05/05/20 1034  BP: (!) 138/95  Pulse: 99  Resp: 20  Temp: (!) 97.4 F (36.3 C)  SpO2: 97%  Weight: 147 lb (66.7 kg)  Height: 5\' 8"  (1.727 m)   Body mass index is 22.35 kg/m.  Physical Exam HENT:     Head: Normocephalic.     Nose:     Comments: Wearing a mask Cardiovascular:     Rate and Rhythm: Normal rate.     Pulses:          Femoral pulses are 2+ on the right side and 2+ on the left side.    Comments: I cannot find any signals at his ankle on the right Pulmonary:     Effort: Pulmonary effort is normal.  Abdominal:     General: Abdomen is flat.     Palpations: Abdomen is soft.  Skin:    Comments: There is a large wound on the lateral anterior aspect of the right leg with necrosis.  This measures approximately 5 x 10 cm appears to be deep into the muscle  Neurological:     General: No focal deficit present.     Mental Status: He is alert.  Psychiatric:        Mood and Affect: Mood normal.        Behavior: Behavior normal.        Thought Content: Thought content normal.        Judgment: Judgment normal.     Data: Vein mapping +--------------+----------------+--------------------+-------------+-------  ----+   RT Diameter  RT Findings      GSV      LT Diameter LT  Findings     (cm)                       (cm)           +--------------+----------------+--------------------+-------------+-------  ----+           No great    Saphenofemoral    0.38                   saphenous vein    Junction                          visualized.                           +--------------+----------------+--------------------+-------------+-------   ----+                   Proximal thigh    0.25           +--------------+----------------+--------------------+-------------+-------  ----+  Mid thigh     0.25           +--------------+----------------+--------------------+-------------+-------  ----+                   Distal thigh    0.38           +--------------+----------------+--------------------+-------------+-------  ----+                     Knee      0.32           +--------------+----------------+--------------------+-------------+-------  ----+                    Prox calf     0.27           +--------------+----------------+--------------------+-------------+-------  ----+                    Mid calf     0.23           +--------------+----------------+--------------------+-------------+-------  ----+                   Distal calf     0.20           +--------------+----------------+--------------------+-------------+-------  ----+    Right Tech Comments:  Summary:    Right: No great saphenous vein visualized.  Left: Patent great saphenous vein with minimal branching.       Assessment/Plan:     64 year old male with the above-noted procedures.  Unfortunate this time I do not think his right lower extremity is salvageable.  We did discuss possibly for him to a peroneal artery bypass we could not rebypass the anterior tibial artery given the location of the wound.  I am also concerned that he could infect his previous bypass graft.  Skin below the knee actually looks quite healthy I think we can begin with below-knee amputation.  Patient with severe disability at this time has agreed for below-knee amputation.  He understands that he will need rehab  at least 3 months prior to prosthesis.  We will get him scheduled early next week.     Waynetta Sandy MD [facility]

## 2020-05-06 ENCOUNTER — Other Ambulatory Visit (HOSPITAL_COMMUNITY)
Admission: RE | Admit: 2020-05-06 | Discharge: 2020-05-06 | Disposition: A | Discharge status: Home / Self Care | Payer: 59 | Source: Ambulatory Visit | Attending: Vascular Surgery | Admitting: Vascular Surgery

## 2020-05-06 DIAGNOSIS — Z20822 Contact with and (suspected) exposure to covid-19: Secondary | ICD-10-CM | POA: Insufficient documentation

## 2020-05-06 DIAGNOSIS — Z01812 Encounter for preprocedural laboratory examination: Secondary | ICD-10-CM | POA: Insufficient documentation

## 2020-05-06 LAB — SARS CORONAVIRUS 2 (TAT 6-24 HRS): SARS Coronavirus 2: NEGATIVE

## 2020-05-08 ENCOUNTER — Encounter (HOSPITAL_COMMUNITY): Payer: Self-pay | Admitting: Vascular Surgery

## 2020-05-08 NOTE — Anesthesia Preprocedure Evaluation (Addendum)
Anesthesia Evaluation  Patient identified by MRN, date of birth, ID band Patient awake    Reviewed: Allergy & Precautions, NPO status , Patient's Chart, lab work & pertinent test results  Airway Mallampati: III  TM Distance: >3 FB Neck ROM: Full    Dental  (+) Poor Dentition, Dental Advisory Given, Missing   Pulmonary shortness of breath, COPD, former smoker,    Pulmonary exam normal breath sounds clear to auscultation       Cardiovascular hypertension, Pt. on medications + Peripheral Vascular Disease  Normal cardiovascular exam Rhythm:Regular Rate:Normal     Neuro/Psych PSYCHIATRIC DISORDERS  Neuromuscular disease    GI/Hepatic negative GI ROS, Neg liver ROS,   Endo/Other  negative endocrine ROS  Renal/GU Renal disease     Musculoskeletal negative musculoskeletal ROS (+)   Abdominal   Peds  Hematology  (+) Blood dyscrasia, anemia ,   Anesthesia Other Findings   Reproductive/Obstetrics                           Anesthesia Physical Anesthesia Plan  ASA: III  Anesthesia Plan: Regional   Post-op Pain Management:    Induction: Intravenous  PONV Risk Score and Plan: 1 and Propofol infusion, TIVA and Treatment may vary due to age or medical condition  Airway Management Planned: Natural Airway  Additional Equipment: None  Intra-op Plan:   Post-operative Plan:   Informed Consent: I have reviewed the patients History and Physical, chart, labs and discussed the procedure including the risks, benefits and alternatives for the proposed anesthesia with the patient or authorized representative who has indicated his/her understanding and acceptance.     Dental advisory given  Plan Discussed with: CRNA  Anesthesia Plan Comments: (PAT note written 05/08/2020 by Myra Gianotti, PA-C. )      Anesthesia Quick Evaluation

## 2020-05-08 NOTE — Progress Notes (Addendum)
Anesthesia Chart Review:  Case: [id number] Date/Time: 05/09/20 0715   Procedure: RIGHT BELOW KNEE AMPUTATION (Right Knee)   Anesthesia type: General   Pre-op diagnosis: CRITICAL LOWER LIMB ISCHEMIA   Location: [facility] OR ROOM 09 / [facility]   Surgeons: Waynetta Sandy, MD      DISCUSSION: Patient is a 64 year old male scheduled for the above procedure. He has had multiple percutaneous interventions to his RLE and then RLE bypass 09/14/19 which have occluded. He now has progressive RLE wounds.    History includes former smoke(quit 12/23/18), PAD (s/p right SFA & popliteal artery stents 05/06/18 with occlusion s/p multiple percutaneous interventions RLE from 06/25/18-01/29/19 including TPA/mechanical thrombectomy and stents to right SFA, popliteal, tibioperoneal trunk, right AT/PT arteries with reocclusion, s/p extension right femoral endarterectomy, right CFA-AT artery bypass using composite PTFE/GSV graft, right CIA and EIA stenting 09/14/19; occluded right F-AT bypass & s/p left EIA stent 04/04/20), HTN, HLD, occasional dyspnea, CKD. Currently drinks 4 beers/week (down from 12 beers/week). + COVID-19 test 07/18/19.   He is to continue ASA. He is no longer on Plavix.   Negative COVID-19 test 05/07/19. He is a same day work-up, so anesthesia team to evaluate on the day of surgery.     VS: Ht 5\' 8"  (1.727 m)   Wt 66.7 kg   BMI 22.35 kg/m   BP Readings from Last 3 Encounters:  05/05/20 (!) 138/95  04/04/20 (!) 142/87  03/31/20 (!) 159/104   Pulse Readings from Last 3 Encounters:  05/05/20 99  04/04/20 73  03/31/20 84    PROVIDERS: Steele Sizer, MD is PCP. Last visit 03/14/20.    LABS: For day of procedure. Currently, last lab results include: Lab Results  Component Value Date   WBC 5.3 03/14/2020   HGB 12.9 (L) 04/04/2020   HCT 38.0 (L) 04/04/2020   PLT 263 03/14/2020   GLUCOSE 97 04/04/2020   ALT 10 03/14/2020   AST 13 03/14/2020   NA 140 04/04/2020   K 4.8 04/04/2020   CL 104  04/04/2020   CREATININE 1.40 (H) 04/04/2020   BUN 25 (H) 04/04/2020   CO2 24 03/14/2020   INR 1.2 09/13/2019     IMAGES: CT Chest 03/16/20: IMPRESSION: 1. Lung-RADS 1, negative. Continue annual screening with low-dose chest CT without contrast in 12 months. 2. Aortic Atherosclerosis (ICD10-I70.0) and Emphysema (ICD10-J43.9).    EKG: 12/06/19: NSR   CV: N/A  Past Medical History:  Diagnosis Date  . CKD (chronic kidney disease)   . History of blood transfusion   . Hyperlipidemia   . Hypertension   . Neuromuscular disorder (HCC)    numbness feet  . Peripheral vascular disease ([place])   . Shortness of breath dyspnea    occasional     Past Surgical History:  Procedure Laterality Date  . ABDOMINAL AORTOGRAM W/LOWER EXTREMITY N/A 09/13/2019   Procedure: ABDOMINAL AORTOGRAM W/LOWER EXTREMITY;  Surgeon: Waynetta Sandy, MD;  Location: [facility];  Service: Cardiovascular;  Laterality: N/A;  . COLONOSCOPY WITH PROPOFOL N/A 02/15/2016   Procedure: COLONOSCOPY WITH PROPOFOL;  Surgeon: Lucilla Lame, MD;  Location: [facility];  Service: Endoscopy;  Laterality: N/A;  . FEMORAL-POPLITEAL BYPASS GRAFT Right 09/14/2019   Procedure: BYPASS GRAFT FEMORAL-POPLITEAL ARTERY;  Surgeon: Waynetta Sandy, MD;  Location: [facility];  Service: Vascular;  Laterality: Right;  . HERNIA REPAIR  1999   left inguinal  . INSERTION OF ILIAC STENT Right 09/14/2019   Procedure: Insertion Of Common  and External Iliac Stent;  Surgeon: Waynetta Sandy, MD;  Location: [facility];  Service: Vascular;  Laterality: Right;  . LOWER EXTREMITY ANGIOGRAPHY Right 05/07/2018   Procedure: LOWER EXTREMITY ANGIOGRAPHY;  Surgeon: Algernon Huxley, MD;  Location: [facility];  Service: Cardiovascular;  Laterality: Right;  . LOWER EXTREMITY ANGIOGRAPHY Right 06/25/2018   Procedure: LOWER EXTREMITY ANGIOGRAPHY;  Surgeon: Algernon Huxley, MD;  Location: [facility];  Service:  Cardiovascular;  Laterality: Right;  . LOWER EXTREMITY ANGIOGRAPHY Right 07/01/2018   Procedure: LOWER EXTREMITY ANGIOGRAPHY;  Surgeon: Algernon Huxley, MD;  Location: [facility];  Service: Cardiovascular;  Laterality: Right;  . LOWER EXTREMITY ANGIOGRAPHY Right 08/12/2018   Procedure: LOWER EXTREMITY ANGIOGRAPHY;  Surgeon: Algernon Huxley, MD;  Location: [facility];  Service: Cardiovascular;  Laterality: Right;  . LOWER EXTREMITY ANGIOGRAPHY Right 09/03/2018   Procedure: LOWER EXTREMITY ANGIOGRAPHY;  Surgeon: Algernon Huxley, MD;  Location: [facility];  Service: Cardiovascular;  Laterality: Right;  . LOWER EXTREMITY ANGIOGRAPHY Right 09/04/2018   Procedure: Lower Extremity Angiography;  Surgeon: Algernon Huxley, MD;  Location: [facility];  Service: Cardiovascular;  Laterality: Right;  . LOWER EXTREMITY ANGIOGRAPHY Right 10/01/2018   Procedure: LOWER EXTREMITY ANGIOGRAPHY;  Surgeon: Algernon Huxley, MD;  Location: [facility];  Service: Cardiovascular;  Laterality: Right;  . LOWER EXTREMITY ANGIOGRAPHY Right 10/01/2018   Procedure: Lower Extremity Angiography;  Surgeon: Algernon Huxley, MD;  Location: [facility];  Service: Cardiovascular;  Laterality: Right;  . LOWER EXTREMITY ANGIOGRAPHY Right 10/15/2018   Procedure: LOWER EXTREMITY ANGIOGRAPHY;  Surgeon: Algernon Huxley, MD;  Location: [facility];  Service: Cardiovascular;  Laterality: Right;  . LOWER EXTREMITY ANGIOGRAPHY Left 10/16/2018   Procedure: Lower Extremity Angiography;  Surgeon: Algernon Huxley, MD;  Location: [facility];  Service: Cardiovascular;  Laterality: Left;  . LOWER EXTREMITY ANGIOGRAPHY Left 01/20/2019   Procedure: LOWER EXTREMITY ANGIOGRAPHY;  Surgeon: Algernon Huxley, MD;  Location: [facility];  Service: Cardiovascular;  Laterality: Left;  . LOWER EXTREMITY ANGIOGRAPHY Right 01/21/2019   Procedure: Lower Extremity Angiography;  Surgeon: Algernon Huxley, MD;  Location: [facility];  Service: Cardiovascular;  Laterality: Right;  . LOWER EXTREMITY ANGIOGRAPHY Right 01/28/2019   Procedure: LOWER EXTREMITY ANGIOGRAPHY;  Surgeon: Algernon Huxley, MD;  Location: [facility];  Service: Cardiovascular;  Laterality: Right;  . LOWER EXTREMITY ANGIOGRAPHY Right 01/29/2019   Procedure: Lower Extremity Angiography;  Surgeon: Algernon Huxley, MD;  Location: [facility];  Service: Cardiovascular;  Laterality: Right;  . LOWER EXTREMITY ANGIOGRAPHY Right 04/04/2020   Procedure: LOWER EXTREMITY ANGIOGRAPHY;  Surgeon: Waynetta Sandy, MD;  Location: [facility];  Service: Cardiovascular;  Laterality: Right;  . LOWER EXTREMITY INTERVENTION Right 07/02/2018   Procedure: LOWER EXTREMITY INTERVENTION;  Surgeon: Algernon Huxley, MD;  Location: [facility];  Service: Cardiovascular;  Laterality: Right;  . PERIPHERAL VASCULAR BALLOON ANGIOPLASTY Left 04/04/2020   Procedure: PERIPHERAL VASCULAR BALLOON ANGIOPLASTY;  Surgeon: Waynetta Sandy, MD;  Location: [facility];  Service: Cardiovascular;  Laterality: Left;  external iliac  . POLYPECTOMY N/A 02/15/2016   Procedure: POLYPECTOMY;  Surgeon: Lucilla Lame, MD;  Location: [facility];  Service: Endoscopy;  Laterality: N/A;    MEDICATIONS: No current facility-administered medications for this encounter.   Marland Kitchen acetaminophen (TYLENOL) 500 MG tablet  .  albuterol (VENTOLIN HFA) 108 (90 Base) MCG/ACT inhaler  . amLODipine-valsartan (EXFORGE) 5-160 MG tablet  . aspirin EC 81 MG tablet  . atorvastatin (LIPITOR) 20 MG tablet  . Cholecalciferol (DIALYVITE VITAMIN D 5000) 125 MCG (5000 UT) capsule  . Cyanocobalamin (B-12) 500 MCG SUBL  . Fluticasone-Umeclidin-Vilant (TRELEGY ELLIPTA) 100-62.5-25 MCG/INH AEPB  . SANTYL ointment  . clopidogrel (PLAVIX) 75 MG tablet  . thiamine (VITAMIN B-1) 50 MG tablet    Myra Gianotti, PA-C Surgical Short Stay/Anesthesiology [facility] Phone [phone] [facility] Phone [phone] 05/08/2020 10:51 AM

## 2020-05-09 ENCOUNTER — Inpatient Hospital Stay (HOSPITAL_COMMUNITY): Payer: 59 | Admitting: Vascular Surgery

## 2020-05-09 ENCOUNTER — Ambulatory Visit: Payer: 59 | Admitting: Physician Assistant

## 2020-05-09 ENCOUNTER — Inpatient Hospital Stay (HOSPITAL_COMMUNITY)
Admission: RE | Admit: 2020-05-09 | Discharge: 2020-05-17 | DRG: 240 | Disposition: A | Discharge status: Rehabilitation Facility | Payer: 59 | Attending: Vascular Surgery | Admitting: Vascular Surgery

## 2020-05-09 ENCOUNTER — Other Ambulatory Visit: Payer: Self-pay

## 2020-05-09 ENCOUNTER — Encounter (HOSPITAL_COMMUNITY)
Admission: RE | Disposition: A | Discharge status: Rehabilitation Facility | Payer: Self-pay | Source: Home / Self Care | Attending: Vascular Surgery

## 2020-05-09 DIAGNOSIS — I998 Other disorder of circulatory system: Secondary | ICD-10-CM | POA: Diagnosis present

## 2020-05-09 DIAGNOSIS — Z79899 Other long term (current) drug therapy: Secondary | ICD-10-CM | POA: Diagnosis not present

## 2020-05-09 DIAGNOSIS — Z87891 Personal history of nicotine dependence: Secondary | ICD-10-CM

## 2020-05-09 DIAGNOSIS — N179 Acute kidney failure, unspecified: Secondary | ICD-10-CM | POA: Diagnosis present

## 2020-05-09 DIAGNOSIS — J449 Chronic obstructive pulmonary disease, unspecified: Secondary | ICD-10-CM | POA: Diagnosis present

## 2020-05-09 DIAGNOSIS — I739 Peripheral vascular disease, unspecified: Secondary | ICD-10-CM | POA: Diagnosis present

## 2020-05-09 DIAGNOSIS — Z7982 Long term (current) use of aspirin: Secondary | ICD-10-CM

## 2020-05-09 DIAGNOSIS — I129 Hypertensive chronic kidney disease with stage 1 through stage 4 chronic kidney disease, or unspecified chronic kidney disease: Secondary | ICD-10-CM | POA: Diagnosis present

## 2020-05-09 DIAGNOSIS — S88111A Complete traumatic amputation at level between knee and ankle, right lower leg, initial encounter: Secondary | ICD-10-CM | POA: Diagnosis not present

## 2020-05-09 DIAGNOSIS — G546 Phantom limb syndrome with pain: Secondary | ICD-10-CM | POA: Diagnosis not present

## 2020-05-09 DIAGNOSIS — I70261 Atherosclerosis of native arteries of extremities with gangrene, right leg: Secondary | ICD-10-CM | POA: Diagnosis present

## 2020-05-09 DIAGNOSIS — N183 Chronic kidney disease, stage 3 unspecified: Secondary | ICD-10-CM | POA: Diagnosis present

## 2020-05-09 DIAGNOSIS — Z8616 Personal history of COVID-19: Secondary | ICD-10-CM | POA: Diagnosis not present

## 2020-05-09 DIAGNOSIS — D62 Acute posthemorrhagic anemia: Secondary | ICD-10-CM | POA: Diagnosis not present

## 2020-05-09 DIAGNOSIS — I10 Essential (primary) hypertension: Secondary | ICD-10-CM | POA: Diagnosis not present

## 2020-05-09 DIAGNOSIS — Z7902 Long term (current) use of antithrombotics/antiplatelets: Secondary | ICD-10-CM

## 2020-05-09 DIAGNOSIS — E785 Hyperlipidemia, unspecified: Secondary | ICD-10-CM | POA: Diagnosis present

## 2020-05-09 DIAGNOSIS — T8743 Infection of amputation stump, right lower extremity: Secondary | ICD-10-CM | POA: Diagnosis not present

## 2020-05-09 DIAGNOSIS — G8918 Other acute postprocedural pain: Secondary | ICD-10-CM | POA: Diagnosis not present

## 2020-05-09 HISTORY — DX: Personal history of other medical treatment: Z92.89

## 2020-05-09 HISTORY — DX: Chronic kidney disease, unspecified: N18.9

## 2020-05-09 HISTORY — PX: AMPUTATION: SHX166

## 2020-05-09 LAB — COMPREHENSIVE METABOLIC PANEL
ALT: 28 U/L (ref 0–44)
AST: 27 U/L (ref 15–41)
Albumin: 3.5 g/dL (ref 3.5–5.0)
Alkaline Phosphatase: 81 U/L (ref 38–126)
Anion gap: 10 (ref 5–15)
BUN: 23 mg/dL (ref 8–23)
CO2: 24 mmol/L (ref 22–32)
Calcium: 9.4 mg/dL (ref 8.9–10.3)
Chloride: 102 mmol/L (ref 98–111)
Creatinine, Ser: 1.54 mg/dL — ABNORMAL HIGH (ref 0.61–1.24)
GFR, Estimated: 50 mL/min — ABNORMAL LOW (ref >60)
Glucose, Bld: 105 mg/dL — ABNORMAL HIGH (ref 70–99)
Potassium: 5.1 mmol/L (ref 3.5–5.1)
Sodium: 136 mmol/L (ref 135–145)
Total Bilirubin: 0.7 mg/dL (ref 0.3–1.2)
Total Protein: 7.6 g/dL (ref 6.5–8.1)

## 2020-05-09 LAB — URINALYSIS, ROUTINE W REFLEX MICROSCOPIC
Bacteria, UA: NONE SEEN
Bilirubin Urine: NEGATIVE
Glucose, UA: NEGATIVE mg/dL
Hgb urine dipstick: NEGATIVE
Ketones, ur: NEGATIVE mg/dL
Leukocytes,Ua: NEGATIVE
Nitrite: NEGATIVE
Protein, ur: 30 mg/dL — AB
Specific Gravity, Urine: 1.013 (ref 1.005–1.030)
pH: 5 (ref 5.0–8.0)

## 2020-05-09 LAB — CBC
HCT: 35.7 % — ABNORMAL LOW (ref 39.0–52.0)
Hemoglobin: 11.6 g/dL — ABNORMAL LOW (ref 13.0–17.0)
MCH: 31.3 pg (ref 26.0–34.0)
MCHC: 32.5 g/dL (ref 30.0–36.0)
MCV: 96.2 fL (ref 80.0–100.0)
Platelets: 409 10*3/uL — ABNORMAL HIGH (ref 150–400)
RBC: 3.71 MIL/uL — ABNORMAL LOW (ref 4.22–5.81)
RDW: 11.9 % (ref 11.5–15.5)
WBC: 6.6 10*3/uL (ref 4.0–10.5)
nRBC: 0 % (ref 0.0–0.2)

## 2020-05-09 LAB — POCT I-STAT, CHEM 8
BUN: 25 mg/dL — ABNORMAL HIGH (ref 8–23)
Calcium, Ion: 1.25 mmol/L (ref 1.15–1.40)
Chloride: 103 mmol/L (ref 98–111)
Creatinine, Ser: 1.5 mg/dL — ABNORMAL HIGH (ref 0.61–1.24)
Glucose, Bld: 115 mg/dL — ABNORMAL HIGH (ref 70–99)
HCT: 33 % — ABNORMAL LOW (ref 39.0–52.0)
Hemoglobin: 11.2 g/dL — ABNORMAL LOW (ref 13.0–17.0)
Potassium: 4.5 mmol/L (ref 3.5–5.1)
Sodium: 137 mmol/L (ref 135–145)
TCO2: 24 mmol/L (ref 22–32)

## 2020-05-09 LAB — APTT: aPTT: 38 seconds — ABNORMAL HIGH (ref 24–36)

## 2020-05-09 LAB — PROTIME-INR
INR: 1.2 (ref 0.8–1.2)
Prothrombin Time: 14.4 seconds (ref 11.4–15.2)

## 2020-05-09 LAB — TYPE AND SCREEN
ABO/RH(D): A POS
Antibody Screen: NEGATIVE

## 2020-05-09 SURGERY — AMPUTATION BELOW KNEE
Anesthesia: Monitor Anesthesia Care | Site: Knee | Laterality: Right

## 2020-05-09 MED ORDER — BACITRACIN ZINC 500 UNIT/GM EX OINT
TOPICAL_OINTMENT | CUTANEOUS | Status: DC | PRN
  Administered 2020-05-09: 1 via TOPICAL

## 2020-05-09 MED ORDER — DEXAMETHASONE SODIUM PHOSPHATE 4 MG/ML IJ SOLN
INTRAMUSCULAR | Status: DC | PRN
  Administered 2020-05-09: 5 mg via PERINEURAL
  Administered 2020-05-09: 3 mg via PERINEURAL

## 2020-05-09 MED ORDER — PHENYLEPHRINE 40 MCG/ML (10ML) SYRINGE FOR IV PUSH (FOR BLOOD PRESSURE SUPPORT)
PREFILLED_SYRINGE | INTRAVENOUS | Status: DC | PRN
  Administered 2020-05-09: 40 ug via INTRAVENOUS
  Administered 2020-05-09: 120 ug via INTRAVENOUS
  Administered 2020-05-09 (×4): 80 ug via INTRAVENOUS

## 2020-05-09 MED ORDER — MEPERIDINE HCL 25 MG/ML IJ SOLN: 6.2500 mg | INTRAMUSCULAR | Status: DC | PRN

## 2020-05-09 MED ORDER — GUAIFENESIN-DM 100-10 MG/5ML PO SYRP: 15.0000 mL | ORAL_SOLUTION | ORAL | Status: DC | PRN

## 2020-05-09 MED ORDER — FLEET ENEMA 7-19 GM/118ML RE ENEM
1.0000 | ENEMA | Freq: Once | RECTAL | Status: AC | PRN
  Administered 2020-05-17: 1 via RECTAL
  Filled 2020-05-09: qty 1

## 2020-05-09 MED ORDER — ROCURONIUM BROMIDE 10 MG/ML (PF) SYRINGE
PREFILLED_SYRINGE | INTRAVENOUS | Status: AC
  Filled 2020-05-09: qty 10

## 2020-05-09 MED ORDER — HYDRALAZINE HCL 20 MG/ML IJ SOLN: 5.0000 mg | INTRAMUSCULAR | Status: DC | PRN

## 2020-05-09 MED ORDER — CHLORHEXIDINE GLUCONATE 0.12 % MT SOLN
15.0000 mL | Freq: Once | OROMUCOSAL | Status: AC
  Administered 2020-05-09: 15 mL via OROMUCOSAL
  Filled 2020-05-09: qty 15

## 2020-05-09 MED ORDER — CEFAZOLIN SODIUM-DEXTROSE 2-4 GM/100ML-% IV SOLN
2.0000 g | INTRAVENOUS | Status: AC
  Administered 2020-05-09: 2 g via INTRAVENOUS
  Filled 2020-05-09: qty 100

## 2020-05-09 MED ORDER — SODIUM CHLORIDE 0.9 % IV SOLN: INTRAVENOUS | Status: DC

## 2020-05-09 MED ORDER — ATORVASTATIN CALCIUM 10 MG PO TABS
20.0000 mg | ORAL_TABLET | Freq: Every day | ORAL | Status: DC
  Administered 2020-05-09 – 2020-05-17 (×9): 20 mg via ORAL
  Filled 2020-05-09 (×9): qty 2

## 2020-05-09 MED ORDER — BUPIVACAINE-EPINEPHRINE (PF) 0.5% -1:200000 IJ SOLN
INTRAMUSCULAR | Status: DC | PRN
  Administered 2020-05-09: 25 mL via PERINEURAL
  Administered 2020-05-09: 15 mL via PERINEURAL

## 2020-05-09 MED ORDER — AMLODIPINE BESYLATE-VALSARTAN 5-160 MG PO TABS: 1.0000 | ORAL_TABLET | Freq: Every day | ORAL | Status: DC

## 2020-05-09 MED ORDER — BACITRACIN ZINC 500 UNIT/GM EX OINT
TOPICAL_OINTMENT | CUTANEOUS | Status: AC
  Filled 2020-05-09: qty 28.35

## 2020-05-09 MED ORDER — VITAMIN B-12 1000 MCG PO TABS
500.0000 ug | ORAL_TABLET | Freq: Every day | ORAL | Status: DC
  Administered 2020-05-10 – 2020-05-17 (×8): 500 ug via ORAL
  Filled 2020-05-09 (×10): qty 1

## 2020-05-09 MED ORDER — MIDAZOLAM HCL 2 MG/2ML IJ SOLN
INTRAMUSCULAR | Status: AC
  Filled 2020-05-09: qty 2

## 2020-05-09 MED ORDER — ALBUTEROL SULFATE (2.5 MG/3ML) 0.083% IN NEBU: 3.0000 mL | INHALATION_SOLUTION | Freq: Four times a day (QID) | RESPIRATORY_TRACT | Status: DC | PRN

## 2020-05-09 MED ORDER — FLUTICASONE-UMECLIDIN-VILANT 100-62.5-25 MCG/INH IN AEPB: 1.0000 | INHALATION_SPRAY | Freq: Every day | RESPIRATORY_TRACT | Status: DC | PRN

## 2020-05-09 MED ORDER — 0.9 % SODIUM CHLORIDE (POUR BTL) OPTIME
TOPICAL | Status: DC | PRN
  Administered 2020-05-09: 1000 mL

## 2020-05-09 MED ORDER — ONDANSETRON HCL 4 MG/2ML IJ SOLN: 4.0000 mg | Freq: Four times a day (QID) | INTRAMUSCULAR | Status: DC | PRN

## 2020-05-09 MED ORDER — AMLODIPINE BESYLATE 5 MG PO TABS
5.0000 mg | ORAL_TABLET | Freq: Every day | ORAL | Status: DC
  Administered 2020-05-10 – 2020-05-17 (×8): 5 mg via ORAL
  Filled 2020-05-09 (×8): qty 1

## 2020-05-09 MED ORDER — MIDAZOLAM HCL 5 MG/5ML IJ SOLN
INTRAMUSCULAR | Status: DC | PRN
  Administered 2020-05-09: 2 mg via INTRAVENOUS

## 2020-05-09 MED ORDER — PHENYLEPHRINE 40 MCG/ML (10ML) SYRINGE FOR IV PUSH (FOR BLOOD PRESSURE SUPPORT)
PREFILLED_SYRINGE | INTRAVENOUS | Status: AC
  Filled 2020-05-09: qty 10

## 2020-05-09 MED ORDER — ALUM & MAG HYDROXIDE-SIMETH 200-200-20 MG/5ML PO SUSP
15.0000 mL | ORAL | Status: DC | PRN
  Administered 2020-05-15: 30 mL via ORAL
  Filled 2020-05-09: qty 30

## 2020-05-09 MED ORDER — LACTATED RINGERS IV SOLN: INTRAVENOUS | Status: DC

## 2020-05-09 MED ORDER — VITAMIN D 25 MCG (1000 UNIT) PO TABS
5000.0000 [IU] | ORAL_TABLET | Freq: Every day | ORAL | Status: DC
  Administered 2020-05-09 – 2020-05-17 (×9): 5000 [IU] via ORAL
  Filled 2020-05-09 (×9): qty 5

## 2020-05-09 MED ORDER — LABETALOL HCL 5 MG/ML IV SOLN: 10.0000 mg | INTRAVENOUS | Status: DC | PRN

## 2020-05-09 MED ORDER — ORAL CARE MOUTH RINSE: 15.0000 mL | Freq: Once | OROMUCOSAL | Status: AC

## 2020-05-09 MED ORDER — ACETAMINOPHEN 325 MG PO TABS
325.0000 mg | ORAL_TABLET | ORAL | Status: DC | PRN
  Administered 2020-05-13 – 2020-05-14 (×2): 650 mg via ORAL
  Filled 2020-05-09 (×2): qty 2

## 2020-05-09 MED ORDER — DOCUSATE SODIUM 100 MG PO CAPS
100.0000 mg | ORAL_CAPSULE | Freq: Every day | ORAL | Status: DC
  Administered 2020-05-10 – 2020-05-17 (×8): 100 mg via ORAL
  Filled 2020-05-09 (×8): qty 1

## 2020-05-09 MED ORDER — FENTANYL CITRATE (PF) 250 MCG/5ML IJ SOLN
INTRAMUSCULAR | Status: AC
  Filled 2020-05-09: qty 5

## 2020-05-09 MED ORDER — CEFAZOLIN SODIUM-DEXTROSE 2-4 GM/100ML-% IV SOLN
2.0000 g | Freq: Three times a day (TID) | INTRAVENOUS | Status: AC
  Administered 2020-05-09 (×2): 2 g via INTRAVENOUS
  Filled 2020-05-09 (×2): qty 100

## 2020-05-09 MED ORDER — ACETAMINOPHEN 325 MG RE SUPP
325.0000 mg | RECTAL | Status: DC | PRN
  Filled 2020-05-09: qty 2

## 2020-05-09 MED ORDER — ONDANSETRON HCL 4 MG/2ML IJ SOLN
INTRAMUSCULAR | Status: DC | PRN
  Administered 2020-05-09: 4 mg via INTRAVENOUS

## 2020-05-09 MED ORDER — MAGNESIUM SULFATE 2 GM/50ML IV SOLN
2.0000 g | Freq: Every day | INTRAVENOUS | Status: DC | PRN
  Filled 2020-05-09: qty 50

## 2020-05-09 MED ORDER — PANTOPRAZOLE SODIUM 40 MG PO TBEC
40.0000 mg | DELAYED_RELEASE_TABLET | Freq: Every day | ORAL | Status: DC
  Administered 2020-05-09 – 2020-05-17 (×9): 40 mg via ORAL
  Filled 2020-05-09 (×9): qty 1

## 2020-05-09 MED ORDER — FENTANYL CITRATE (PF) 250 MCG/5ML IJ SOLN
INTRAMUSCULAR | Status: DC | PRN
  Administered 2020-05-09 (×2): 25 ug via INTRAVENOUS
  Administered 2020-05-09: 100 ug via INTRAVENOUS

## 2020-05-09 MED ORDER — PROPOFOL 500 MG/50ML IV EMUL
INTRAVENOUS | Status: DC | PRN
  Administered 2020-05-09: 120 ug/kg/min via INTRAVENOUS

## 2020-05-09 MED ORDER — SENNOSIDES-DOCUSATE SODIUM 8.6-50 MG PO TABS: 1.0000 | ORAL_TABLET | Freq: Every evening | ORAL | Status: DC | PRN

## 2020-05-09 MED ORDER — POTASSIUM CHLORIDE CRYS ER 20 MEQ PO TBCR: 20.0000 meq | EXTENDED_RELEASE_TABLET | Freq: Every day | ORAL | Status: DC | PRN

## 2020-05-09 MED ORDER — HYDROMORPHONE HCL 1 MG/ML IJ SOLN: 0.2500 mg | INTRAMUSCULAR | Status: DC | PRN

## 2020-05-09 MED ORDER — CHLORHEXIDINE GLUCONATE CLOTH 2 % EX PADS: 6.0000 | MEDICATED_PAD | Freq: Once | CUTANEOUS | Status: DC

## 2020-05-09 MED ORDER — LIDOCAINE 2% (20 MG/ML) 5 ML SYRINGE
INTRAMUSCULAR | Status: DC | PRN
  Administered 2020-05-09: 40 mg via INTRAVENOUS

## 2020-05-09 MED ORDER — BISACODYL 5 MG PO TBEC
5.0000 mg | DELAYED_RELEASE_TABLET | Freq: Every day | ORAL | Status: DC | PRN
  Administered 2020-05-13 – 2020-05-16 (×3): 5 mg via ORAL
  Filled 2020-05-09 (×3): qty 1

## 2020-05-09 MED ORDER — FLUTICASONE FUROATE-VILANTEROL 100-25 MCG/INH IN AEPB
1.0000 | INHALATION_SPRAY | Freq: Every day | RESPIRATORY_TRACT | Status: DC
  Administered 2020-05-11 – 2020-05-17 (×6): 1 via RESPIRATORY_TRACT
  Filled 2020-05-09 (×3): qty 28

## 2020-05-09 MED ORDER — PHENOL 1.4 % MT LIQD: 1.0000 | OROMUCOSAL | Status: DC | PRN

## 2020-05-09 MED ORDER — ASPIRIN EC 81 MG PO TBEC
81.0000 mg | DELAYED_RELEASE_TABLET | Freq: Every day | ORAL | Status: DC
  Administered 2020-05-10 – 2020-05-17 (×8): 81 mg via ORAL
  Filled 2020-05-09 (×8): qty 1

## 2020-05-09 MED ORDER — CLONIDINE HCL (ANALGESIA) 100 MCG/ML EP SOLN
EPIDURAL | Status: DC | PRN
  Administered 2020-05-09: 50 ug
  Administered 2020-05-09: 30 ug

## 2020-05-09 MED ORDER — UMECLIDINIUM BROMIDE 62.5 MCG/INH IN AEPB
1.0000 | INHALATION_SPRAY | Freq: Every day | RESPIRATORY_TRACT | Status: DC
  Administered 2020-05-11 – 2020-05-17 (×6): 1 via RESPIRATORY_TRACT
  Filled 2020-05-09 (×4): qty 7

## 2020-05-09 MED ORDER — LIDOCAINE 2% (20 MG/ML) 5 ML SYRINGE
INTRAMUSCULAR | Status: AC
  Filled 2020-05-09: qty 5

## 2020-05-09 MED ORDER — PROMETHAZINE HCL 25 MG/ML IJ SOLN: 6.2500 mg | INTRAMUSCULAR | Status: DC | PRN

## 2020-05-09 MED ORDER — PROPOFOL 10 MG/ML IV BOLUS
INTRAVENOUS | Status: AC
  Filled 2020-05-09: qty 20

## 2020-05-09 MED ORDER — HYDROMORPHONE HCL 1 MG/ML IJ SOLN
0.5000 mg | INTRAMUSCULAR | Status: DC | PRN
  Administered 2020-05-09 – 2020-05-10 (×3): 0.5 mg via INTRAVENOUS
  Administered 2020-05-11: 1 mg via INTRAVENOUS
  Administered 2020-05-12: 0.5 mg via INTRAVENOUS
  Filled 2020-05-09: qty 1
  Filled 2020-05-09 (×4): qty 0.5
  Filled 2020-05-09: qty 1

## 2020-05-09 MED ORDER — METOPROLOL TARTRATE 5 MG/5ML IV SOLN: 2.0000 mg | INTRAVENOUS | Status: DC | PRN

## 2020-05-09 MED ORDER — HEPARIN SODIUM (PORCINE) 5000 UNIT/ML IJ SOLN
5000.0000 [IU] | Freq: Three times a day (TID) | INTRAMUSCULAR | Status: DC
  Administered 2020-05-10 – 2020-05-17 (×23): 5000 [IU] via SUBCUTANEOUS
  Filled 2020-05-09 (×23): qty 1

## 2020-05-09 MED ORDER — IRBESARTAN 150 MG PO TABS
150.0000 mg | ORAL_TABLET | Freq: Every day | ORAL | Status: DC
  Administered 2020-05-10 – 2020-05-17 (×8): 150 mg via ORAL
  Filled 2020-05-09 (×8): qty 1

## 2020-05-09 MED ORDER — OXYCODONE-ACETAMINOPHEN 5-325 MG PO TABS
1.0000 | ORAL_TABLET | ORAL | Status: DC | PRN
  Administered 2020-05-09: 2 via ORAL
  Administered 2020-05-09: 1 via ORAL
  Administered 2020-05-09 – 2020-05-17 (×37): 2 via ORAL
  Filled 2020-05-09 (×31): qty 2
  Filled 2020-05-09: qty 1
  Filled 2020-05-09 (×7): qty 2

## 2020-05-09 SURGICAL SUPPLY — 55 items
BANDAGE ESMARK 6X9 LF (GAUZE/BANDAGES/DRESSINGS) IMPLANT
BLADE LONG MED 31X9 (MISCELLANEOUS) IMPLANT
BLADE SAW GIGLI 510 (BLADE) ×2 IMPLANT
BNDG CMPR 9X6 STRL LF SNTH (GAUZE/BANDAGES/DRESSINGS)
BNDG COHESIVE 6X5 TAN STRL LF (GAUZE/BANDAGES/DRESSINGS) ×2 IMPLANT
BNDG ELASTIC 4X5.8 VLCR STR LF (GAUZE/BANDAGES/DRESSINGS) ×2 IMPLANT
BNDG ELASTIC 6X5.8 VLCR STR LF (GAUZE/BANDAGES/DRESSINGS) IMPLANT
BNDG ESMARK 6X9 LF (GAUZE/BANDAGES/DRESSINGS)
BNDG GAUZE ELAST 4 BULKY (GAUZE/BANDAGES/DRESSINGS) ×2 IMPLANT
CANISTER SUCT 3000ML PPV (MISCELLANEOUS) ×2 IMPLANT
CLIP VESOCCLUDE MED 6/CT (CLIP) IMPLANT
COVER SURGICAL LIGHT HANDLE (MISCELLANEOUS) ×2 IMPLANT
CUFF TOURN SGL QUICK 34 (TOURNIQUET CUFF)
CUFF TOURN SGL QUICK 42 (TOURNIQUET CUFF) IMPLANT
CUFF TRNQT CYL 34X4.125X (TOURNIQUET CUFF) IMPLANT
DRAIN CHANNEL 19F RND (DRAIN) IMPLANT
DRAPE HALF SHEET 40X57 (DRAPES) ×2 IMPLANT
DRAPE ORTHO SPLIT 77X108 STRL (DRAPES) ×4
DRAPE SURG ORHT 6 SPLT 77X108 (DRAPES) ×2 IMPLANT
DRSG ADAPTIC 3X8 NADH LF (GAUZE/BANDAGES/DRESSINGS) ×2 IMPLANT
ELECT REM PT RETURN 9FT ADLT (ELECTROSURGICAL) ×2
ELECTRODE REM PT RTRN 9FT ADLT (ELECTROSURGICAL) ×1 IMPLANT
EVACUATOR SILICONE 100CC (DRAIN) IMPLANT
GAUZE SPONGE 4X4 12PLY STRL (GAUZE/BANDAGES/DRESSINGS) ×2 IMPLANT
GLOVE BIO SURGEON STRL SZ7 (GLOVE) ×2 IMPLANT
GLOVE BIO SURGEON STRL SZ7.5 (GLOVE) ×2 IMPLANT
GLOVE BIOGEL PI IND STRL 6.5 (GLOVE) ×1 IMPLANT
GLOVE BIOGEL PI INDICATOR 6.5 (GLOVE) ×1
GLOVE INDICATOR 7.0 STRL GRN (GLOVE) ×2 IMPLANT
GLOVE SURG SS PI 6.5 STRL IVOR (GLOVE) ×2 IMPLANT
GOWN STRL REUS W/ TWL LRG LVL3 (GOWN DISPOSABLE) ×2 IMPLANT
GOWN STRL REUS W/ TWL XL LVL3 (GOWN DISPOSABLE) ×1 IMPLANT
GOWN STRL REUS W/TWL LRG LVL3 (GOWN DISPOSABLE) ×4
GOWN STRL REUS W/TWL XL LVL3 (GOWN DISPOSABLE) ×2
KIT BASIN OR (CUSTOM PROCEDURE TRAY) ×2 IMPLANT
KIT TURNOVER KIT B (KITS) ×2 IMPLANT
NS IRRIG 1000ML POUR BTL (IV SOLUTION) ×2 IMPLANT
PACK GENERAL/GYN (CUSTOM PROCEDURE TRAY) ×2 IMPLANT
PAD ARMBOARD 7.5X6 YLW CONV (MISCELLANEOUS) ×4 IMPLANT
STAPLER VISISTAT 35W (STAPLE) ×2 IMPLANT
STOCKINETTE IMPERVIOUS LG (DRAPES) ×2 IMPLANT
SUT BONE WAX W31G (SUTURE) IMPLANT
SUT ETHILON 3 0 PS 1 (SUTURE) IMPLANT
SUT SILK 0 TIES 10X30 (SUTURE) IMPLANT
SUT SILK 2 0 (SUTURE) ×2
SUT SILK 2 0 SH CR/8 (SUTURE) ×2 IMPLANT
SUT SILK 2-0 18XBRD TIE 12 (SUTURE) ×1 IMPLANT
SUT SILK 3 0 (SUTURE)
SUT SILK 3-0 18XBRD TIE 12 (SUTURE) IMPLANT
SUT VIC AB 2-0 CT1 18 (SUTURE) ×4 IMPLANT
SUT VICRYL 0 TIES 12 18 (SUTURE) ×2 IMPLANT
SYR BULB IRRIG 60ML STRL (SYRINGE) ×2 IMPLANT
TOWEL GREEN STERILE (TOWEL DISPOSABLE) ×4 IMPLANT
UNDERPAD 30X36 HEAVY ABSORB (UNDERPADS AND DIAPERS) ×2 IMPLANT
WATER STERILE IRR 1000ML POUR (IV SOLUTION) ×2 IMPLANT

## 2020-05-09 NOTE — Anesthesia Postprocedure Evaluation (Signed)
Anesthesia Post Note  Patient: Ryan [name]  Procedure(s) Performed: RIGHT BELOW KNEE AMPUTATION (Right Knee)     Patient location during evaluation: PACU Anesthesia Type: Regional Level of consciousness: awake and alert Pain management: pain level controlled Vital Signs Assessment: post-procedure vital signs reviewed and stable Respiratory status: spontaneous breathing Cardiovascular status: stable Anesthetic complications: no   No complications documented.  Last Vitals:  Vitals:   05/09/20 1045 05/09/20 1112  BP: (!) 137/92 111/78  Pulse: 88 85  Resp: 20 17  Temp: (!) 36.3 C (!) 36.4 C  SpO2: 98% 100%    Last Pain:  Vitals:   05/09/20 1230  TempSrc:   PainSc: [place]

## 2020-05-09 NOTE — Progress Notes (Signed)
Inpatient Rehabilitation Admissions Coordinator  Inpatient rehab consult received. I await therapy evaluations to assist with planning most appropriate rehab venue.  Danne Baxter, RN, MSN Rehab Admissions Coordinator [phone] 05/09/2020 1:30 PM

## 2020-05-09 NOTE — Transfer of Care (Signed)
Immediate Anesthesia Transfer of Care Note  Patient: Ryan [name]  Procedure(s) Performed: RIGHT BELOW KNEE AMPUTATION (Right Knee)  Patient Location: PACU  Anesthesia Type:MAC combined with regional for post-op pain  Level of Consciousness: awake, alert , oriented and patient cooperative  Airway & Oxygen Therapy: Patient Spontanous Breathing and Patient connected to face mask oxygen  Post-op Assessment: Report given to RN, Post -op Vital signs reviewed and stable and Patient moving all extremities  Post vital signs: Reviewed and stable  Last Vitals:  Vitals Value Taken Time  BP 102/70 05/09/20 0946  Temp    Pulse 84 05/09/20 0946  Resp 15 05/09/20 0946  SpO2 98 % 05/09/20 0946  Vitals shown include unvalidated device data.  Last Pain:  Vitals:   05/09/20 0701  TempSrc:   PainSc: 0-No pain         Complications: No complications documented.

## 2020-05-09 NOTE — Progress Notes (Signed)
RN spoke with pt's daughter Renda Rolls. Updated her on pt and POC. Daughter states that pt has sciatic nerve pain and sometimes that can cause him issues with pain. Daughter given number to front desk to call for updates.

## 2020-05-09 NOTE — Interval H&P Note (Signed)
History and Physical Interval Note:  05/09/2020 7:14 AM  Ryan [name]  has presented today for surgery, with the diagnosis of CRITICAL LOWER LIMB ISCHEMIA.  The various methods of treatment have been discussed with the patient and family. After consideration of risks, benefits and other options for treatment, the patient has consented to  Procedure(s): RIGHT BELOW KNEE AMPUTATION (Right) as a surgical intervention.  The patient's history has been reviewed, patient examined, no change in status, stable for surgery.  I have reviewed the patient's chart and labs.  Questions were answered to the patient's satisfaction.     Servando Snare

## 2020-05-09 NOTE — Anesthesia Procedure Notes (Signed)
Anesthesia Regional Block: Popliteal block   Pre-Anesthetic Checklist: ,, timeout performed, Correct Patient, Correct Site, Correct Laterality, Correct Procedure, Correct Position, site marked, Risks and benefits discussed,  Surgical consent,  Pre-op evaluation,  At surgeon's request and post-op pain management  Laterality: Right  Prep: chloraprep       Needles:  Injection technique: Single-shot  Needle Type: Stimiplex     Needle Length: 10cm  Needle Gauge: 21     Additional Needles:   Procedures:,,,, ultrasound used (permanent image in chart),,,,  Motor weakness within 5 minutes.  Narrative:  Start time: 05/09/2020 7:13 AM End time: 05/09/2020 7:18 AM Injection made incrementally with aspirations every 5 mL.  Performed by: Personally  Anesthesiologist: Nolon Nations, MD  Additional Notes: Nerve located and needle positioned with direct ultrasound guidance. Good perineural spread. Patient tolerated well.

## 2020-05-09 NOTE — Anesthesia Procedure Notes (Signed)
Anesthesia Regional Block: Adductor canal block   Pre-Anesthetic Checklist: ,, timeout performed, Correct Patient, Correct Site, Correct Laterality, Correct Procedure, Correct Position, site marked, Risks and benefits discussed,  Surgical consent,  Pre-op evaluation,  At surgeon's request and post-op pain management  Laterality: Right  Prep: chloraprep       Needles:  Injection technique: Single-shot  Needle Type: Stimiplex     Needle Length: 9cm  Needle Gauge: 21     Additional Needles:   Procedures:,,,, ultrasound used (permanent image in chart),,,,  Narrative:  Start time: 05/09/2020 7:10 AM End time: 05/09/2020 7:13 AM Injection made incrementally with aspirations every 5 mL.  Performed by: Personally  Anesthesiologist: Nolon Nations, MD  Additional Notes: BP cuff, EKG monitors applied. Sedation begun. Artery and nerve location verified with U/S and anesthetic injected incrementally, slowly, and after negative aspirations under direct u/s guidance. Good fascial /perineural spread. Tolerated well.

## 2020-05-09 NOTE — Op Note (Signed)
    Patient name: Ryan [name] MRN: [id number] DOB: Mar 07, 1956 Sex: male  05/09/2020 Pre-operative Diagnosis: Ischemic right lower extremity Post-operative diagnosis:  Same Surgeon:  Erlene Quan C. [name], MD Assistant: Margot Chimes, MS 3 Procedure Performed:  Right below knee amputation  Indications: 64 year old male has undergone extensive stenting of the right lower extremity as well as femoral to anterior tibial bypass.  These have all occluded the now with extensive wounds of the right lower extremities indicated for amputation.   Procedure:  The patient was identified in the holding area and taken to the operating room where is placed supine on upper table.  Preoperative block of been placed.  He was sterilely prepped over the right lower extremity in the usual fashion antibiotics were minister timeout was called.  Tourniquet was fashioned above the knee.  We exsanguinated the right lower extremity and tourniquet was inflated to the 250 mmHg for a total of 17 minutes.  Two thirds 1/3 type incision was made.  We dissected down to the tibia.  We transected this with Gigli saw.  The fibula was transected 1 cm higher.  Posterior flap was created with amputation knife.  The vessels were clamped and suture-ligated.  Tourniquet was let down.  Wound was thoroughly irrigated.  We did thin out some of the soleus muscle.  Fascia was then reapproximated with interrupted 2-0 Vicryl suture.  Staples were placed in the skin.  A sterile dressing was placed.  He was awakened from anesthesia having tolerated procedure without any complication.  All counts were correct at completion.  EBL 50 cc    [name] C. [name], MD [facility] Office: [phone] Pager: [phone]

## 2020-05-10 ENCOUNTER — Encounter (HOSPITAL_COMMUNITY): Payer: Self-pay | Admitting: Vascular Surgery

## 2020-05-10 LAB — BASIC METABOLIC PANEL
Anion gap: 7 (ref 5–15)
BUN: 24 mg/dL — ABNORMAL HIGH (ref 8–23)
CO2: 22 mmol/L (ref 22–32)
Calcium: 8.7 mg/dL — ABNORMAL LOW (ref 8.9–10.3)
Chloride: 105 mmol/L (ref 98–111)
Creatinine, Ser: 1.64 mg/dL — ABNORMAL HIGH (ref 0.61–1.24)
GFR, Estimated: 47 mL/min — ABNORMAL LOW (ref >60)
Glucose, Bld: 182 mg/dL — ABNORMAL HIGH (ref 70–99)
Potassium: 4.9 mmol/L (ref 3.5–5.1)
Sodium: 134 mmol/L — ABNORMAL LOW (ref 135–145)

## 2020-05-10 LAB — CBC
HCT: 32.5 % — ABNORMAL LOW (ref 39.0–52.0)
Hemoglobin: 10.8 g/dL — ABNORMAL LOW (ref 13.0–17.0)
MCH: 31.1 pg (ref 26.0–34.0)
MCHC: 33.2 g/dL (ref 30.0–36.0)
MCV: 93.7 fL (ref 80.0–100.0)
Platelets: 396 10*3/uL (ref 150–400)
RBC: 3.47 MIL/uL — ABNORMAL LOW (ref 4.22–5.81)
RDW: 11.6 % (ref 11.5–15.5)
WBC: 10.3 10*3/uL (ref 4.0–10.5)
nRBC: 0 % (ref 0.0–0.2)

## 2020-05-10 LAB — SURGICAL PATHOLOGY

## 2020-05-10 MED ORDER — ZOLPIDEM TARTRATE 5 MG PO TABS
5.0000 mg | ORAL_TABLET | Freq: Once | ORAL | Status: AC
  Administered 2020-05-10: 5 mg via ORAL
  Filled 2020-05-10: qty 1

## 2020-05-10 NOTE — Progress Notes (Signed)
Patient refused continuous pulse monitor. Pt is educated on the need to

## 2020-05-10 NOTE — Evaluation (Signed)
 Occupational Therapy Evaluation Patient Details Name: Ryan [name] MRN: [id number] DOB: 1955-09-14 Today's Date: 05/10/2020    History of Present Illness Ryan [name] is a 64 y.o. male has history of extensive stenting of his right lower extremity performed at outside institutions.  I then performed a right anterior tibial artery bypass with composite graft and vein and common femoral endarterectomy with retrograde stenting of his external iliac artery.  Subsequently this has occluded.  He has a wound on his right leg which is failed to heal this is now necrotic. 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 Pt presents now s/p BKA with deficits in balance, strength, endurance, and knowledge of DME use. Pt very pleasant and motivated to return to independence. Pt overall Min A for transfers and short mobility with RW. However, while standing at sink for ADLs, pt with one major posterior LOB requiring Total A to correct and prevent fall. Pt requires Min A for UB ADLs and Mod A for LB ADLs. Pt's son present and supportive during session, concerned about falls and pt safety. Both pt and son interested in [place]. Based on pt's high PLOF and motivation, pt would benefit from intensive therapies to maximize safety with ADLs, IADLs and mobility. Plan to address LB ADLs and balance during next session.     Follow Up Recommendations  CIR;Supervision/Assistance - 24 hour    Equipment Recommendations  3 in 1 bedside commode;Tub/shower bench    Recommendations for Other Services Rehab consult     Precautions / Restrictions Precautions Precautions: Fall Restrictions Weight Bearing Restrictions: Yes RLE Weight Bearing: Non weight bearing      Mobility Bed Mobility               General bed mobility comments: sitting EOB with PT on entry     Transfers Overall transfer level: Needs  assistance Equipment used: Rolling walker (2 wheeled) Transfers: Sit to/from Stand Sit to Stand: Min assist         General transfer comment: Min A with safety in power up, cues for hand placement. Pt able to demo mobility to/from bathroom with Min A for balance/safety. When standing at sink, pt demo one instance of severe posterior LOB requiring Total A to correct to prevent fall.     Balance Overall balance assessment: Needs assistance Sitting-balance support: No upper extremity supported;Feet supported Sitting balance-Leahy Scale: Fair     Standing balance support: Bilateral upper extremity supported;During functional activity Standing balance-Leahy Scale: Poor Standing balance comment: reliant on UE support, one instance of LOB with Total A to correct                           ADL either performed or assessed with clinical judgement   ADL Overall ADL's : Needs assistance/impaired Eating/Feeding: Independent;Sitting   Grooming: Minimal assistance;Standing;Oral care Grooming Details (indicate cue type and reason): Min A for oral care standing at sink to ensure safety and balance, assistance for setting up task due to difficulty maintaining balance with bimanual tasks Upper Body Bathing: Set up;Sitting   Lower Body Bathing: Moderate assistance;Sitting/lateral leans;Sit to/from stand   Upper Body Dressing : Set up;Sitting   Lower Body Dressing: Moderate assistance;Sitting/lateral leans;Sit to/from stand   Toilet Transfer: Minimal assistance;Stand-pivot;RW;BSC Toilet Transfer Details (indicate cue type and reason): Min A for maintaining balance when turning  Toileting- Clothing Manipulation and Hygiene: Moderate assistance;Sitting/lateral lean;Sit to/from stand       Functional mobility during ADLs: Minimal assistance;Rolling walker;Cueing for sequencing;Cueing for safety General ADL Comments: Limited by decreased balance and endurance     Vision Baseline  Vision/History: No visual deficits Patient Visual Report: No change from baseline Vision Assessment?: No apparent visual deficits     Perception     Praxis      Pertinent Vitals/Pain Pain Assessment: 0-10 Pain Score: 5  Pain Location: R residual limb Pain Descriptors / Indicators: Discomfort Pain Intervention(s): Monitored during session;Repositioned;Premedicated before session     Hand Dominance Right   Extremity/Trunk Assessment Upper Extremity Assessment Upper Extremity Assessment: Overall WFL for tasks assessed   Lower Extremity Assessment Lower Extremity Assessment: Defer to PT evaluation   Cervical / Trunk Assessment Cervical / Trunk Assessment: Normal   Communication Communication Communication: No difficulties   Cognition Arousal/Alertness: Awake/alert Behavior During Therapy: WFL for tasks assessed/performed Overall Cognitive Status: Within Functional Limits for tasks assessed                                     General Comments  Pt's son present and supportive during session. Discussed various types of rehab with pt motivated to return to complete independence `    Exercises     Shoulder Instructions      Home Living Family/patient expects to be discharged to:: Private residence Living Arrangements: Children Available Help at Discharge: Family;Available PRN/intermittently (daughter and her wife work) Type of Home: House Home Access: Stairs to enter Technical  of Steps: 1   Home Layout: Two level;Able to live on main level with bedroom/bathroom Alternate Level Stairs-Number of Steps: 12   Bathroom Shower/Tub: Teacher, early years/pre: Standard     Home Equipment: Mining engineer - 2 wheels;Wheelchair - manual   Additional Comments: plans to get a tub bench      Prior Functioning/Environment Level of Independence: Independent        Comments: Reports independent IADLs, ADLS, driving, short community  distances        OT Problem List: Decreased strength;Decreased activity tolerance;Impaired balance (sitting and/or standing);Decreased safety awareness;Decreased knowledge of use of DME or AE;Decreased knowledge of precautions;Pain      OT Treatment/Interventions: Self-care/ADL training;Therapeutic exercise;DME and/or AE instruction;Therapeutic activities;Patient/family education;Balance training    OT Goals(Current goals can be found in the care plan section) Acute Rehab OT Goals Patient Stated Goal: work hard, return to complete independence OT Goal Formulation: With patient/family Time For Goal Achievement: 05/24/20 Potential to Achieve Goals: Good ADL Goals Pt Will Perform Grooming: with modified independence;standing Pt Will Perform Lower Body Bathing: with modified independence;sitting/lateral leans;sit to/from stand Pt Will Perform Lower Body Dressing: with modified independence;sitting/lateral leans;sit to/from stand Pt Will Transfer to Toilet: with modified independence;ambulating Pt Will Perform Toileting - Clothing Manipulation and hygiene: with modified independence;sitting/lateral leans;sit to/from stand Pt Will Perform Tub/Shower Transfer: with set-up;ambulating;Tub transfer;tub bench;rolling walker Pt/caregiver will Perform Home Exercise Program: Increased strength;Both right and left upper extremity;With theraband;Independently;With written HEP provided  OT Frequency: Min 2X/week   Barriers to D/C:            Co-evaluation              AM-PAC OT "6 Clicks" Daily Activity     Outcome Measure Help from another person eating meals?: None Help from another person taking care  of personal grooming?: A Little Help from another person toileting, which includes using toliet, bedpan, or urinal?: A Lot Help from another person bathing (including washing, rinsing, drying)?: A Lot Help from another person to put on and taking off regular upper body clothing?: A  Little Help from another person to put on and taking off regular lower body clothing?: A Lot 6 Click Score: 16   End of Session Equipment Utilized During Treatment: Gait belt;Rolling walker Nurse Communication: Mobility status  Activity Tolerance: Patient tolerated treatment well Patient left: in chair;with call bell/phone within reach;with chair alarm set  OT Visit Diagnosis: Unsteadiness on feet (R26.81);Other abnormalities of gait and mobility (R26.89);Muscle weakness (generalized) (M62.81);Pain Pain - Right/Left: Right Pain - part of body: Leg                Time: 3343-5686 OT Time Calculation (min): 19 min Charges:  OT General Charges $OT Visit: 1 Visit OT Evaluation $OT Eval Moderate Complexity: 1 Mod  Layla Maw, OTR/L  Layla Maw 05/10/2020, 1:40 PM

## 2020-05-10 NOTE — Evaluation (Signed)
Physical Therapy Evaluation Patient Details Name: Ryan [name] MRN: [id number] DOB: 1955-07-29 Today's Date: 05/10/2020   History of Present Illness  CREEDON DANIELSKI is a 64 y.o. male has history of extensive stenting of his right lower extremity performed at outside institutions.  I then performed a right anterior tibial artery bypass with composite graft and vein and common femoral endarterectomy with retrograde stenting of his external iliac artery.  Subsequently this has occluded.  He has a wound on his right leg which is failed to heal this is now necrotic. 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 Comes from home where he lives with grown daughter and her spouse in a single family home with one step to enter; Able to stay on main level with bedroom and bathroom; Was completely independent leading up to this admission, walking without an assistive device, driving; Occasionally used WC for community access due to claudication pain; Presents to PT with decr balance, higher fall risk, phantom sensation in RLE, potential to develop hip flexor and hamstring tightness post BKA; Mr. Austad is very motivated to return to independence, and get back to walking without an assistive device when he gets his prosthesis; I believe with the intensive therapies provided at CIR he will get to modified independence to get home, AND get the best start possible towards preparing to walk with a prosthesis; Patient will benefit from skilled PT to increase their independence and safety with mobility to allow discharge to the venue listed below.       Follow Up Recommendations CIR    Equipment Recommendations  Rolling walker with 5" wheels;3in1 (PT) (may already have a RW)    Recommendations for Other Services OT consult;Rehab consult (as ordered)     Precautions /  Restrictions Precautions Precautions: Fall Restrictions Weight Bearing Restrictions: Yes RLE Weight Bearing: Non weight bearing      Mobility  Bed Mobility Overal bed mobility: Needs Assistance Bed Mobility: Rolling Rolling: Supervision;Min guard         General bed mobility comments: [place] for safety, and Supervision/setup to manage bedclothes as pt rolled supine to prone and back    Transfers Overall transfer level: Needs assistance Equipment used: Rolling walker (2 wheeled) Transfers: Sit to/from Stand Sit to Stand: Min assist         General transfer comment: Min A with safety in power up, cues for hand placement. Pt able to demo mobility to/from bathroom with Min A for balance/safety. When standing at sink, pt demo one instance of severe posterior LOB requiring Total A to correct to prevent fall.   Ambulation/Gait Ambulation/Gait assistance: Min assist;+2 safety/equipment Gait Distance (Feet): 15 Feet (to and from bathroom) Assistive device: Rolling walker (2 wheeled) Gait Pattern/deviations: Step-to pattern     General Gait Details: Cues to to avoid energetically taxing hopping by supporting bodyweight on RW for smoother, swing-type L foot advancement  Stairs            Wheelchair Mobility    Modified Rankin (Stroke Patients Only)       Balance Overall balance assessment: Needs assistance Sitting-balance support: No upper extremity supported;Feet supported Sitting balance-Leahy Scale: Fair     Standing balance support: Bilateral upper extremity supported;During functional activity Standing balance-Leahy Scale: Poor Standing balance comment: reliant on UE support, one instance of LOB with Total A to correct  Pertinent Vitals/Pain Pain Assessment: 0-10 Pain Score: 5  Pain Location: R residual limb Pain Descriptors / Indicators: Discomfort Pain Intervention(s): Monitored during session    Home Living  Family/patient expects to be discharged to:: Private residence Living Arrangements: Children Available Help at Discharge: Family;Available PRN/intermittently (daughter and her wife work) Type of Home: House Home Access: Stairs to enter   Technical brewer of Steps: 1 Home Layout: Two level;Able to live on main level with bedroom/bathroom Home Equipment: Latina Craver - 2 wheels;Wheelchair - manual Additional Comments: plans to get a tub bench    Prior Function Level of Independence: Independent         Comments: Reports independent IADLs, ADLS, driving, short community distances     Hand Dominance   Dominant Hand: Right    Extremity/Trunk Assessment   Upper Extremity Assessment Upper Extremity Assessment: Defer to OT evaluation    Lower Extremity Assessment Lower Extremity Assessment: RLE deficits/detail RLE Deficits / Details: POD#1 post BKA; Able to extend knee to just shy of full extension; Phantom sensation present (itching); Educated in desensitization    Cervical / Trunk Assessment Cervical / Trunk Assessment: Normal  Communication   Communication: No difficulties  Cognition Arousal/Alertness: Awake/alert Behavior During Therapy: WFL for tasks assessed/performed Overall Cognitive Status: Within Functional Limits for tasks assessed                                        General Comments General comments (skin integrity, edema, etc.): Pt's son present and supportive during session. Discussed various types of rehab with pt motivated to return to complete independence `    Exercises Other Exercises Other Exercises: Bolstered briging x10 reps Other Exercises: Seated R hamstring stretch x3 Other Exercises: Prone hip extension R LE x 10   Assessment/Plan    PT Assessment Patient needs continued PT services  PT Problem List Decreased strength;Decreased range of motion;Decreased activity tolerance;Decreased balance;Decreased  mobility;Decreased coordination;Decreased knowledge of use of DME;Decreased safety awareness;Decreased knowledge of precautions;Pain;Impaired sensation       PT Treatment Interventions DME instruction;Gait training;Stair training;Functional mobility training;Therapeutic activities;Therapeutic exercise;Balance training;Neuromuscular re-education;Cognitive remediation;Patient/family education    PT Goals (Current goals can be found in the Care Plan section)  Acute Rehab PT Goals Patient Stated Goal: work hard, return to complete independence; get a prosthesis PT Goal Formulation: With patient Time For Goal Achievement: 05/24/20 Potential to Achieve Goals: Good    Frequency Min 4X/week   Barriers to discharge        Co-evaluation PT/OT/SLP Co-Evaluation/Treatment: Yes ([place])             AM-PAC PT "6 Clicks" Mobility  Outcome Measure Help needed turning from your back to your side while in a flat bed without using bedrails?: None Help needed moving from lying on your back to sitting on the side of a flat bed without using bedrails?: None Help needed moving to and from a bed to a chair (including a wheelchair)?: A Little Help needed standing up from a chair using your arms (e.g., wheelchair or bedside chair)?: A Little Help needed to walk in hospital room?: A Lot Help needed climbing 3-5 steps with a railing? : A Lot 6 Click Score: 18    End of Session Equipment Utilized During Treatment: Gait belt Activity Tolerance: Patient tolerated treatment well Patient left: in chair;with call bell/phone within reach;with chair alarm set;with family/visitor present Nurse Communication:  Mobility status PT Visit Diagnosis: Other abnormalities of gait and mobility (R26.89)    Time: 1046-1110 PT Time Calculation (min) (ACUTE ONLY): 24 min   Charges:   PT Evaluation $PT Eval Moderate Complexity: 1 Mod          Roney [place], [place]  Acute Rehabilitation Services Pager  [phone] Office [phone]   Colletta [place] 05/10/2020, 2:03 PM

## 2020-05-10 NOTE — Progress Notes (Addendum)
     Subjective  - Doing well with good pain control   Objective 107/86 (!) 57 97.8 F (36.6 C) 17 100%  Intake/Output Summary (Last 24 hours) at 05/10/2020 0800 Last data filed at 05/10/2020 0400 Gross per 24 hour  Intake 2072.97 ml  Output 1000 ml  Net 1072.97 ml    Right BKA dressing clean and dry Lungs non labored breathing Heart RRR  Assessment/Planning: POD # 1 Right BKA  Plan to change dressing tomorrow Pending CIR/PT/OT HGB stable   Roxy Horseman 05/10/2020 8:00 AM --  Laboratory Lab Results: Recent Labs    05/09/20 0709 05/09/20 0709 05/09/20 0814 05/10/20 0147  WBC 6.6  --   --  10.3  HGB 11.6*   < > 11.2* 10.8*  HCT 35.7*   < > 33.0* 32.5*  PLT 409*  --   --  396   < > = values in this interval not displayed.   BMET Recent Labs    05/09/20 0709 05/09/20 0709 05/09/20 0814 05/10/20 0147  NA 136   < > 137 134*  K 5.1   < > 4.5 4.9  CL 102   < > 103 105  CO2 24  --   --  22  GLUCOSE 105*   < > 115* 182*  BUN 23   < > 25* 24*  CREATININE 1.54*   < > 1.50* 1.64*  CALCIUM 9.4  --   --  8.7*   < > = values in this interval not displayed.    COAG Lab Results  Component Value Date   INR 1.2 05/09/2020   INR 1.2 09/13/2019   INR 1.1 01/20/2019   No results found for: PTT  I have independently interviewed and examined patient and agree with PA assessment and plan above.   Jentzen Minasyan C. [name], MD [facility] Office: [phone] Pager: [phone]

## 2020-05-10 NOTE — Plan of Care (Signed)

## 2020-05-10 NOTE — Progress Notes (Addendum)
Inpatient Rehabilitation Admissions Coordinator  Inpatient rehab consult received. I met with patient and his son at bedside. We discussed goals and expectations of a possible Cir admit  I await PT and OT evals to begin insurance Auth with [facility] for a possible Cir admit.  Danne Baxter, RN, MSN Rehab Admissions Coordinator [phone] 05/10/2020 1:36 PM   I now have therapy evals and will begin insurance authorization.  Danne Baxter, RN, MSN Rehab Admissions Coordinator [phone] 05/10/2020 2:12 PM

## 2020-05-10 NOTE — PMR Pre-admission (Signed)
PMR Admission Coordinator Pre-Admission Assessment  Patient: Ryan [name] is an 64 y.o., male MRN: [id number] DOB: Jun 16, 1956 Height: 5' 8" (172.7 cm) Weight: 66.7 kg  Insurance Information HMO:     PPO:      PCP:      IPA:      80/20:      OTHER:  PRIMARY: [facility]      Policy#: [id number]      Subscriber: pt CM Name: via fax     Phone#: [phone]   Fax#: [phone] Pre-Cert#: [id number] approved for 10 days     Employer:  Benefits:  Phone #: online     Name: 11/17 Eff. Date: 08/23/2019     Deduct: none      Out of Pocket Max: $1500 CIR: 80%      SNF: 80%V 60 days Outpatient: 80%     Co-Pay: 30 visits combined Home Health: 80%      Co-Pay: 205 DME: 80%     Co-Pay: 20% Providers: in network  SECONDARY: none      Policy#:      Phone#:   Development worker, community:       Phone#:   The Engineer, petroleum" for patients in Inpatient Rehabilitation Facilities with attached "Privacy Act Vivian Records" was provided and verbally reviewed with: N/A  Emergency Contact Information Contact Information    Name Relation Home Work Mobile   Sheldon Daughter [phone]  [phone]   lexx, monte Daughter   [phone]   Raylene Miyamoto Daughter   [phone]      Current Medical History  Patient Admitting Diagnosis: BKA  History of Present Illness: 64 year old male with medical history of extensive stenting of his right lower extremity, a right anterior tibial artery bypass with composite graft and vein and common femoral endarterectomy with retrograde stenting of his external iliac artery. This has subsequently occluded. Wound on his right leg is now necrotic. Presented on 05/09/2020 with severe pain in his right lower extremity. Right BKA performed by Dr. [name] on 05/09/2020.  Postoperatively with good pain control and monitoring for anemia.   Patient's medical record from [facility]  has been reviewed by the rehabilitation  admission coordinator and physician.  Past Medical History  Past Medical History:  Diagnosis Date  . CKD (chronic kidney disease)   . History of blood transfusion   . Hyperlipidemia   . Hypertension   . Neuromuscular disorder (HCC)    numbness feet  . Peripheral vascular disease ([place])   . Shortness of breath dyspnea    occasional     Family History   family history includes COPD in his mother; Heart attack in his brother; Hypertension in his father.  Prior Rehab/Hospitalizations Has the patient had prior rehab or hospitalizations prior to admission? Yes  Has the patient had major surgery during 100 days prior to admission? Yes   Current Medications  Current Facility-Administered Medications:  .  0.9 %  sodium chloride infusion, , Intravenous, Continuous, Orbie Hurst, Last Rate: 75 mL/hr at 05/09/20 1228, New Bag at 05/09/20 1228 .  acetaminophen (TYLENOL) tablet 325-650 mg, 325-650 mg, Oral, Q4H PRN, 650 mg at 05/14/20 1056 **OR** acetaminophen (TYLENOL) suppository 325-650 mg, 325-650 mg, Rectal, Q4H PRN, Theda Sers, Emma M, PA-C .  albuterol (PROVENTIL) (2.5 MG/3ML) 0.083% nebulizer solution 3 mL, 3 mL, Inhalation, Q6H PRN, Theda Sers, Emma M, PA-C .  alum & mag hydroxide-simeth (MAALOX/MYLANTA) 200-200-20 MG/5ML suspension 15-30 mL, 15-30 mL, Oral,  Q2H PRN, Ulyses Amor, PA-C, 30 mL at 05/15/20 1843 .  amLODipine (NORVASC) tablet 5 mg, 5 mg, Oral, Daily, Waynetta Sandy, MD, 5 mg at 05/16/20 0820 .  aspirin EC tablet 81 mg, 81 mg, Oral, Daily, Laurence Slate M, PA-C, 81 mg at 05/16/20 0818 .  atorvastatin (LIPITOR) tablet 20 mg, 20 mg, Oral, Daily, Laurence Slate M, PA-C, 20 mg at 05/16/20 2595 .  bisacodyl (DULCOLAX) EC tablet 5 mg, 5 mg, Oral, Daily PRN, Ulyses Amor, PA-C, 5 mg at 05/16/20 1156 .  cholecalciferol (VITAMIN D3) tablet 5,000 Units, 5,000 Units, Oral, Daily, Ulyses Amor, PA-C, 5,000 Units at 05/16/20 0818 .  docusate sodium (COLACE) capsule  100 mg, 100 mg, Oral, Daily, Laurence Slate M, PA-C, 100 mg at 05/16/20 6387 .  fluticasone furoate-vilanterol (BREO ELLIPTA) 100-25 MCG/INH 1 puff, 1 puff, Inhalation, Daily, Waynetta Sandy, MD, 1 puff at 05/17/20 0759 .  guaiFENesin-dextromethorphan (ROBITUSSIN DM) 100-10 MG/5ML syrup 15 mL, 15 mL, Oral, Q4H PRN, Theda Sers, Emma M, PA-C .  heparin injection 5,000 Units, 5,000 Units, Subcutaneous, Q8H, Collins, Emma M, [place], 5,000 Units at 05/17/20 0547 .  hydrALAZINE (APRESOLINE) injection 5 mg, 5 mg, Intravenous, Q20 Min PRN, Theda Sers, Emma M, PA-C .  HYDROmorphone (DILAUDID) injection 0.5-1 mg, 0.5-1 mg, Intravenous, Q2H PRN, Laurence Slate M, PA-C, 0.5 mg at 05/12/20 1051 .  irbesartan (AVAPRO) tablet 150 mg, 150 mg, Oral, Daily, Waynetta Sandy, MD, 150 mg at 05/16/20 0818 .  labetalol (NORMODYNE) injection 10 mg, 10 mg, Intravenous, Q10 min PRN, Theda Sers, Emma M, PA-C .  magnesium sulfate IVPB 2 g 50 mL, 2 g, Intravenous, Daily PRN, Theda Sers, Emma M, PA-C .  metoprolol tartrate (LOPRESSOR) injection 2-5 mg, 2-5 mg, Intravenous, Q2H PRN, Theda Sers, Emma M, PA-C .  ondansetron [facility]) injection 4 mg, 4 mg, Intravenous, Q6H PRN, Theda Sers, Emma M, PA-C .  oxyCODONE-acetaminophen (PERCOCET/ROXICET) 5-325 MG per tablet 1-2 tablet, 1-2 tablet, Oral, Q4H PRN, Ulyses Amor, PA-C, 2 tablet at 05/17/20 0547 .  pantoprazole (PROTONIX) EC tablet 40 mg, 40 mg, Oral, Daily, Laurence Slate M, PA-C, 40 mg at 05/16/20 5643 .  phenol (CHLORASEPTIC) mouth spray 1 spray, 1 spray, Mouth/Throat, PRN, Theda Sers, Emma M, PA-C .  potassium chloride SA (KLOR-CON) CR tablet 20-40 mEq, 20-40 mEq, Oral, Daily PRN, Theda Sers, Emma M, PA-C .  senna-docusate (Senokot-S) tablet 1 tablet, 1 tablet, Oral, QHS PRN, Theda Sers, Emma M, PA-C .  sodium phosphate (FLEET) 7-19 GM/118ML enema 1 enema, 1 enema, Rectal, Once PRN, Theda Sers, Emma M, PA-C .  umeclidinium bromide (INCRUSE ELLIPTA) 62.5 MCG/INH 1 puff, 1 puff,  Inhalation, Daily, Waynetta Sandy, MD, 1 puff at 05/17/20 0759 .  vitamin B-12 (CYANOCOBALAMIN) tablet 500 mcg, 500 mcg, Oral, Daily, Laurence Slate M, PA-C, 500 mcg at 05/16/20 3295 .  zolpidem (AMBIEN) tablet 5 mg, 5 mg, Oral, QHS PRN, Angelia Mould, MD, 5 mg at 05/17/20 0031  Patients Current Diet:  Diet Order            Diet Heart Room service appropriate? Yes; Fluid consistency: Thin  Diet effective now                 Precautions / Restrictions Precautions Precautions: Fall Precaution Comments: Drainage at R residual limb surgical wound Restrictions Weight Bearing Restrictions: Yes RLE Weight Bearing: Non weight bearing   Has the patient had 2 or more falls or a fall with injury in the past year? No  Prior Activity  Level Community (5-7x/wk): Mod I and driving  Prior Functional Level Self Care: Did the patient need help bathing, dressing, using the toilet or eating? Independent  Indoor Mobility: Did the patient need assistance with walking from room to room (with or without device)? Independent  Stairs: Did the patient need assistance with internal or external stairs (with or without device)? Independent  Functional Cognition: Did the patient need help planning regular tasks such as shopping or remembering to take medications? Independent  Home Assistive Devices / Equipment Home Assistive Devices/Equipment: Dentures (specify type), Wheelchair Home Equipment: Cane - quad, Environmental consultant - 2 wheels, Wheelchair - manual  Prior Device Use: Indicate devices/aids used by the patient prior to current illness, exacerbation or injury? None of the above  Current Functional Level Cognition  Overall Cognitive Status: Within Functional Limits for tasks assessed Orientation Level: Oriented X4 General Comments: pleasantly cooperative    Extremity Assessment (includes Sensation/Coordination)  Upper Extremity Assessment: Overall WFL for tasks assessed  Lower  Extremity Assessment: Defer to PT evaluation RLE Deficits / Details: POD#1 post BKA; Able to extend knee to just shy of full extension; Phantom sensation present (itching); Educated in desensitization    ADLs  Overall ADL's : Needs assistance/impaired Eating/Feeding: Independent, Sitting Grooming: Minimal assistance, Standing, Oral care Grooming Details (indicate cue type and reason): Min A for oral care standing at sink to ensure safety and balance, assistance for setting up task due to difficulty maintaining balance with bimanual tasks Upper Body Bathing: Set up, Sitting Lower Body Bathing: Moderate assistance, Sitting/lateral leans, Sit to/from stand Upper Body Dressing : Set up, Sitting Lower Body Dressing: Minimal assistance, Sitting/lateral leans Lower Body Dressing Details (indicate cue type and reason): Collaborated with pt on LB dressing techniques. Using lateral leans, pt able to demo ability to don/doff scrub pants without assistance. Pt reports previously using lateral leans due to pain prior to surgery. Pt will need increased assist for LB ADLs in standing q Toilet Transfer: Min guard, Anterior/posterior, BSC Toilet Transfer Details (indicate cue type and reason): min guard for AP transfer to [facility]. OT assisting in stabilizing BSC  Toileting- Clothing Manipulation and Hygiene: Minimal assistance, Sitting/lateral lean Toileting - Clothing Manipulation Details (indicate cue type and reason): Min A for clothing mgmt. Pt able to use lateral leans for posterior hygiene Functional mobility during ADLs: Minimal assistance, Rolling walker, Cueing for sequencing, Cueing for safety General ADL Comments: Pt with increased pain when residual limb off of bed or when standing. Continues to be motivated to improve despite pain    Mobility  Overal bed mobility: Modified Independent Bed Mobility: Supine to Sit Rolling: Supervision, Min guard Supine to sit: Modified independent (Device/Increase  time), HOB elevated Sit to supine: Supervision General bed mobility comments: pt up in chair pre/post session    Transfers  Overall transfer level: Needs assistance Equipment used: Rolling walker (2 wheeled) Transfers: Sit to/from Stand, Risk manager, Radiographer, therapeutic Sit to Stand: Min guard Stand pivot transfers: Lexicographer transfers: Min guard General transfer comment: Pt performed sit<>stand from recliner<>wheelchair with min guard, no LOB    Ambulation / Gait / Stairs / Wheelchair Mobility  Ambulation/Gait Ambulation/Gait assistance: Herbalist (Feet): 12 Feet Assistive device: Rolling walker (2 wheeled) Gait Pattern/deviations: Step-to pattern General Gait Details: improved L foot clearance this session, small hops, cues for press and glide technique with RW rather than fatiguing up-and-down hops Gait velocity: grossly decreased Wheelchair Mobility Wheelchair mobility: Yes Wheelchair propulsion: Both upper extremities Wheelchair parts:  Supervision/cueing Distance: 300 Wheelchair Assistance Details (indicate cue type and reason): vcs for use of brakes pre/post transfers as well as improved technique to propel and perform pivot turns, pt with good information carryover and min cues for technique/energy conservation provided throughout    Posture / Balance Balance Overall balance assessment: Needs assistance Sitting-balance support: No upper extremity supported, Feet supported Sitting balance-Leahy Scale: Fair Standing balance support: Bilateral upper extremity supported, During functional activity Standing balance-Leahy Scale: Poor Standing balance comment: reliant on BUE support and external support during dynamic standing tasks    Special needs/care consideration Surgical wound   Previous Home Environment  Living Arrangements:  (lives with daughter and her wife)  Lives With: Daughter Available Help at Discharge: Family,  Available PRN/intermittently Type of Home: House Home Layout: Two level, Able to live on main level with bedroom/bathroom Alternate Level Stairs-Number of Steps: 12 Home Access: Stairs to enter [employer] of Steps: 1 Bathroom Shower/Tub: Optometrist: Yes How Accessible: Accessible via walker Home Care Services: No Additional Comments: plans to get a tub bench  Discharge Living Setting Plans for Discharge Living Setting: Lives with (comment) (daughter and her wife) Type of Home at Discharge: House Discharge Home Layout: Two level, Able to live on main level with bedroom/bathroom Alternate Level Stairs-Number of Steps: 12 Discharge Home Access: Stairs to enter Entrance Stairs-Rails: None Entrance Stairs-Number of Steps: 1 Discharge Bathroom Shower/Tub: Tub/shower unit Discharge Bathroom Toilet: Standard Discharge Bathroom Accessibility: Yes How Accessible: Accessible via walker Does the patient have any problems obtaining your medications?: No  Social/Family/Support Systems Contact Information: daughter Anticipated Caregiver: daughter and family Anticipated Caregiver's Contact Information: see above Ability/Limitations of Caregiver: daughter works days Caregiver Availability: Intermittent Discharge Plan Discussed with Primary Caregiver: Yes Is Caregiver In Agreement with Plan?: Yes Does Caregiver/Family have Issues with Lodging/Transportation while Pt is in Rehab?: No  Goals Patient/Family Goal for Rehab: Mod I with PT and OT Expected length of stay: ELOS 7 to 10 days Pt/Family Agrees to Admission and willing to participate: Yes Program Orientation Provided & Reviewed with Pt/Caregiver Including Roles  & Responsibilities: Yes  Decrease burden of Care through IP rehab admission: n/a  Possible need for SNF placement upon discharge: not anticipated  Patient Condition: I have reviewed medical records from [facility], spoken with CM, and patient and son. I met with patient at the bedside for inpatient rehabilitation assessment.  Patient will benefit from ongoing PT and OT, can actively participate in 3 hours of therapy a day 5 days of the week, and can make measurable gains during the admission.  Patient will also benefit from the coordinated team approach during an Inpatient Acute Rehabilitation admission.  The patient will receive intensive therapy as well as Rehabilitation physician, nursing, social worker, and care management interventions.  Due to bladder management, bowel management, safety, skin/wound care, disease management, medication administration, pain management and patient education the patient requires 24 hour a day rehabilitation nursing.  The patient is currently min assist overall with mobility and basic ADLs.  Discharge setting and therapy post discharge at home with home health is anticipated.  Patient has agreed to participate in the Acute Inpatient Rehabilitation Program and will admit today.  Preadmission Screen Completed By:  Cleatrice Burke, 05/17/2020 9:58 AM ______________________________________________________________________   Discussed status with Dr. Ranell Patrick on 05/17/2020 at 1000 and received approval for admission today.  Admission Coordinator:  Cleatrice Burke, RN, time 1000 Date 05/17/2020  Assessment/Plan: Diagnosis:  R BKA 1. Does the need for close, 24 hr/day Medical supervision in concert with the patient's rehab needs make it unreasonable for this patient to be served in a less intensive setting? Yes 2. Co-Morbidities requiring supervision/potential complications: CKD, neuropathy, COPD with DOE, anemia, PAD, wound drainage 3. Due to bladder management, bowel management, safety, skin/wound care, disease management, medication administration, pain management and patient education, does the patient require 24 hr/day rehab nursing? Yes 4. Does the  patient require coordinated care of a physician, rehab nurse, PT, OT to address physical and functional deficits in the context of the above medical diagnosis(es)? Yes Addressing deficits in the following areas: balance, endurance, locomotion, strength, transferring, bowel/bladder control, bathing, dressing, feeding, grooming, toileting, cognition and psychosocial support 5. Can the patient actively participate in an intensive therapy program of at least 3 hrs of therapy 5 days a week? Yes 6. The potential for patient to make measurable gains while on inpatient rehab is excellent 7. Anticipated functional outcomes upon discharge from inpatient rehab: modified independent PT, modified independent OT, independent SLP 8. Estimated rehab length of stay to reach the above functional goals is: 10-14 days 9. Anticipated discharge destination: Home 10. Overall Rehab/Functional Prognosis: excellent   MD Signature: Leeroy Cha, MD

## 2020-05-11 LAB — CBC
HCT: 34.4 % — ABNORMAL LOW (ref 39.0–52.0)
Hemoglobin: 11.2 g/dL — ABNORMAL LOW (ref 13.0–17.0)
MCH: 30.9 pg (ref 26.0–34.0)
MCHC: 32.6 g/dL (ref 30.0–36.0)
MCV: 95 fL (ref 80.0–100.0)
Platelets: 444 10*3/uL — ABNORMAL HIGH (ref 150–400)
RBC: 3.62 MIL/uL — ABNORMAL LOW (ref 4.22–5.81)
RDW: 11.8 % (ref 11.5–15.5)
WBC: 9.6 10*3/uL (ref 4.0–10.5)
nRBC: 0 % (ref 0.0–0.2)

## 2020-05-11 LAB — BASIC METABOLIC PANEL
Anion gap: 11 (ref 5–15)
BUN: 25 mg/dL — ABNORMAL HIGH (ref 8–23)
CO2: 22 mmol/L (ref 22–32)
Calcium: 9.3 mg/dL (ref 8.9–10.3)
Chloride: 103 mmol/L (ref 98–111)
Creatinine, Ser: 1.76 mg/dL — ABNORMAL HIGH (ref 0.61–1.24)
GFR, Estimated: 43 mL/min — ABNORMAL LOW (ref >60)
Glucose, Bld: 103 mg/dL — ABNORMAL HIGH (ref 70–99)
Potassium: 5.1 mmol/L (ref 3.5–5.1)
Sodium: 136 mmol/L (ref 135–145)

## 2020-05-11 NOTE — Progress Notes (Addendum)
  Progress Note    05/11/2020 7:39 AM 2 Days Post-Op  Subjective:  Pain overnight R stump   Vitals:   05/10/20 1300 05/11/20 0300  BP: 112/78 121/72  Pulse: 70 68  Resp:  17  Temp: 98.2 F (36.8 C) 98.1 F (36.7 C)  SpO2: 100% 100%   Physical Exam: Lungs:  Non labored Incisions:  R BKA incision with some sanguinous drainage on dressing but no active bleeding; no palpable hematoma Neurologic: A&O  CBC    Component Value Date/Time   WBC 9.6 05/11/2020 0304   RBC 3.62 (L) 05/11/2020 0304   HGB 11.2 (L) 05/11/2020 0304   HGB 13.1 08/31/2015 0929   HCT 34.4 (L) 05/11/2020 0304   HCT 38.5 08/31/2015 0929   PLT 444 (H) 05/11/2020 0304   PLT 217 08/31/2015 0929   MCV 95.0 05/11/2020 0304   MCV 93 08/31/2015 0929   MCH 30.9 05/11/2020 0304   MCHC 32.6 05/11/2020 0304   RDW 11.8 05/11/2020 0304   RDW 12.8 08/31/2015 0929   LYMPHSABS 1,076 03/14/2020 0919   LYMPHSABS 1.5 08/31/2015 0929   EOSABS 180 03/14/2020 0919   EOSABS 0.1 08/31/2015 0929   BASOSABS 42 03/14/2020 0919   BASOSABS 0.0 08/31/2015 0929    BMET    Component Value Date/Time   NA 136 05/11/2020 0304   NA 137 08/31/2015 0929   K 5.1 05/11/2020 0304   CL 103 05/11/2020 0304   CO2 22 05/11/2020 0304   GLUCOSE 103 (H) 05/11/2020 0304   BUN 25 (H) 05/11/2020 0304   BUN 11 08/31/2015 0929   CREATININE 1.76 (H) 05/11/2020 0304   CREATININE 1.51 (H) 03/14/2020 0919   CALCIUM 9.3 05/11/2020 0304   GFRNONAA 43 (L) 05/11/2020 0304   GFRNONAA 49 (L) 03/14/2020 0919   GFRAA 57 (L) 03/14/2020 0919    INR    Component Value Date/Time   INR 1.2 05/09/2020 0709     Intake/Output Summary (Last 24 hours) at 05/11/2020 0739 Last data filed at 05/11/2020 0441 Gross per 24 hour  Intake 253.57 ml  Output 1000 ml  Net -746.43 ml     Assessment/Plan:  64 y.o. male is s/p R BKA 2 Days Post-Op   R BKA incision unremarkable Daily dressing changes with xeroform, 4x4, kerlex, and ACE Encouraged patient  to keep R knee straight PT/OT/CIR   Dagoberto Ligas, PA-C [facility] [phone] 05/11/2020 7:39 AM  I have independently interviewed and examined patient and agree with PA assessment and plan above.   Jarica Plass C. [name], MD [facility] [place] Office: [phone] Pager: [phone]

## 2020-05-12 NOTE — Progress Notes (Signed)
Orthopedic Tech Progress Note Patient Details:  Ryan [name] 1956-04-28 [id number] Called in order to [facility] for a RETENTION SOCK Patient ID: Ryan [name], male   DOB: 03-10-1956, 64 y.o.   MRN: [id number]   Janit Pagan 05/12/2020, 8:12 AM

## 2020-05-12 NOTE — Progress Notes (Signed)
Inpatient Rehabilitation-Admissions Coordinator   Still following for an insurance determination for CIR. Pt aware.   Raechel Ache, OTR/L  Rehab Admissions Coordinator  [phone] 05/12/2020 2:21 PM

## 2020-05-12 NOTE — Progress Notes (Signed)
Physical Therapy Treatment Patient Details Name: Ryan [name] MRN: [id number] DOB: [birth year] Today's Date: 05/12/2020    History of Present Illness Pt is a 64 y.o. male with h/o RLE pain after multiple surgical attempts for revascularization, now admitted for R BKA on 05/09/20 . Other PMH includes PVD, HTN, CKD.   PT Comments    Pt progressing well with mobility. Remains limited by pain, weakness, decreased activity tolerance and impaired balance strategies/postural reactions, requiring assist to prevent LOB/instability with standing activities; at high risk for falls. Today's session focused on transfer training and LE therex. Pt motivated to participate and regain PLOF. Continue to recommend intensive CIR-level therapies to maximize functional mobility and independence prior to return home.    Follow Up Recommendations  CIR;Supervision for mobility/OOB     Equipment Recommendations  Rolling walker with 5" wheels;3in1 (PT)    Recommendations for Other Services       Precautions / Restrictions Precautions Precautions: Fall;Other (comment) Precaution Comments: Drainage at R residual limb surgical wound Restrictions Weight Bearing Restrictions: Yes RLE Weight Bearing: Non weight bearing    Mobility  Bed Mobility Overal bed mobility: Needs Assistance Bed Mobility: Supine to Sit     Supine to sit: Supervision;HOB elevated     General bed mobility comments: HOB elevated and use of bed rail  Transfers Overall transfer level: Needs assistance Equipment used: Rolling walker (2 wheeled) Transfers: Sit to/from Stand Sit to Stand: Min assist;Min guard         General transfer comment: Multiple sit<>stands from EOB and recliner, cues for sequencing and correct hand placement  Ambulation/Gait Ambulation/Gait assistance: Min assist Gait Distance (Feet): 2 Feet Assistive device: Rolling walker (2 wheeled)       General Gait Details: Pt able to minimally hop on LLE  with RW, difficulty clearing foot, limited by pain/fatigue weakness, minA to correct instability; pt declining further ambulation distance secondary to "I overdid it last time" - therefore opted to focus on transfer training   Stairs             Wheelchair Mobility    Modified Rankin (Stroke Patients Only)       Balance Overall balance assessment: Needs assistance Sitting-balance support: No upper extremity supported;Feet unsupported;Feet supported Sitting balance-Leahy Scale: Fair     Standing balance support: Bilateral upper extremity supported;During functional activity Standing balance-Leahy Scale: Poor Standing balance comment: Reliant on UE support; assist to correct instability with dynamic balance                            Cognition Arousal/Alertness: Awake/alert Behavior During Therapy: WFL for tasks assessed/performed Overall Cognitive Status: Within Functional Limits for tasks assessed                                        Exercises Amputee Exercises Knee Flexion: AROM;Right;Seated Knee Extension: AROM;Right;Seated Straight Leg Raises: AROM;Right;10 reps;Seated (repeated cues to keep knee straight) Other Exercises Other Exercises: Repeated sit<>stands from recliner (reliant on UE support); hip/knee extenson stretch while standing    General Comments General comments (skin integrity, edema, etc.): Daughter on phone throughotu session. 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) Pain Descriptors / Indicators: Discomfort;Grimacing;Burning Pain Intervention(s):  Monitored during session;Other (comment) (techniques to desensitize/overcome phantom limb pain)    Home Living                      Prior Function            PT Goals (current goals can now be found in  the care plan section) Progress towards PT goals: Progressing toward goals    Frequency    Min 3X/week      PT Plan Frequency needs to be updated    Co-evaluation              AM-PAC PT "6 Clicks" Mobility   Outcome Measure  Help needed turning from your back to your side while in a flat bed without using bedrails?: None Help needed moving from lying on your back to sitting on the side of a flat bed without using bedrails?: None Help needed moving to and from a bed to a chair (including a wheelchair)?: A Little Help needed standing up from a chair using your arms (e.g., wheelchair or bedside chair)?: A Little Help needed to walk in hospital room?: A Little Help needed climbing 3-5 steps with a railing? : A Lot 6 Click Score: 19    End of Session Equipment Utilized During Treatment: Gait belt Activity Tolerance: Patient tolerated treatment well Patient left: in chair;with call bell/phone within reach;with chair alarm set Nurse Communication: Mobility status PT Visit Diagnosis: Other abnormalities of gait and mobility (R26.89)     Time: 8381-8403 PT Time Calculation (min) (ACUTE ONLY): 29 min  Charges:  $Therapeutic Exercise: 8-22 mins $Therapeutic Activity: 8-22 mins                     Mabeline Caras, PT, DPT Acute Rehabilitation Services  Pager [phone] Office [place] 05/12/2020, 10:11 AM

## 2020-05-12 NOTE — Progress Notes (Addendum)
  Progress Note    05/12/2020 7:37 AM 3 Days Post-Op  Subjective:  No major complaints. Is having some phantom pains especially during dressing change   Vitals:   05/12/20 0300 05/12/20 0721  BP: 117/71   Pulse: 66   Resp: 16   Temp: 98.1 F (36.7 C)   SpO2: 100% 95%   Physical Exam: Cardiac: regular Lungs:  Non labored Incisions:  right BKA stump staples intact. Some bleeding from lateral staple line. Flaps appear viable. No fluid collections or hematoma. Xeroform and kerlix wrap applied   Extremities:  Well perfused and warm Neurologic: alert and oriented  CBC    Component Value Date/Time   WBC 9.6 05/11/2020 0304   RBC 3.62 (L) 05/11/2020 0304   HGB 11.2 (L) 05/11/2020 0304   HGB 13.1 08/31/2015 0929   HCT 34.4 (L) 05/11/2020 0304   HCT 38.5 08/31/2015 0929   PLT 444 (H) 05/11/2020 0304   PLT 217 08/31/2015 0929   MCV 95.0 05/11/2020 0304   MCV 93 08/31/2015 0929   MCH 30.9 05/11/2020 0304   MCHC 32.6 05/11/2020 0304   RDW 11.8 05/11/2020 0304   RDW 12.8 08/31/2015 0929   LYMPHSABS 1,076 03/14/2020 0919   LYMPHSABS 1.5 08/31/2015 0929   EOSABS 180 03/14/2020 0919   EOSABS 0.1 08/31/2015 0929   BASOSABS 42 03/14/2020 0919   BASOSABS 0.0 08/31/2015 0929    BMET    Component Value Date/Time   NA 136 05/11/2020 0304   NA 137 08/31/2015 0929   K 5.1 05/11/2020 0304   CL 103 05/11/2020 0304   CO2 22 05/11/2020 0304   GLUCOSE 103 (H) 05/11/2020 0304   BUN 25 (H) 05/11/2020 0304   BUN 11 08/31/2015 0929   CREATININE 1.76 (H) 05/11/2020 0304   CREATININE 1.51 (H) 03/14/2020 0919   CALCIUM 9.3 05/11/2020 0304   GFRNONAA 43 (L) 05/11/2020 0304   GFRNONAA 49 (L) 03/14/2020 0919   GFRAA 57 (L) 03/14/2020 0919    INR    Component Value Date/Time   INR 1.2 05/09/2020 0709     Intake/Output Summary (Last 24 hours) at 05/12/2020 0737 Last data filed at 05/12/2020 0300 Gross per 24 hour  Intake 600 ml  Output 2900 ml  Net -2300 ml      Assessment/Plan:  64 y.o. male is s/p R BKA 3 Days Post-Op. Doing well post op. R BKA stump incision intact with some bleeding from lateral staple line. Xeroform and Kerlix wrap applied. Will order retention sock. Hopefully can transition to sock over next day or so once stump dry. Again encouraged patient to work on keeping right knee straight. He has follow up arranged for 06/09/20 for staple removal  Dispo pending CIR authorization  DVT prophylaxis:  Sq Heparin   Marval Regal [facility] [phone] 05/12/2020 7:37 AM   I have independently interviewed and examined patient and agree with PA assessment and plan above.   Aviv Lengacher C. [name], MD [facility] [place] Office: [phone] Pager: [phone]

## 2020-05-12 NOTE — Progress Notes (Signed)
Occupational Therapy Treatment Patient Details Name: Ryan [name] MRN: [id number] DOB: 1955-07-14 Today's Date: 05/12/2020    History of present illness Pt is a 64 y.o. male with h/o RLE pain after multiple surgical attempts for revascularization, now admitted for R BKA on 05/09/20 . Other PMH includes PVD, HTN, CKD.   OT comments  Pt progressing towards OT goals and remains motivated to participate. Session focused on LB ADLs using compensatory strategies, such as lateral leans with pt able to return demo well. Plan to further address standing balance with LB ADLs to decrease fall risk. Provided UE HEP and pt able to return demo 4/4 UE exercises using theraband to maximize strength for transfers. Pt's daughter present and engaged, interested in fall recovery training as appropriate. Continue to highly recommend CIR to maximize ADL, IADL and mobility independence.   Follow Up Recommendations  CIR;Supervision/Assistance - 24 hour    Equipment Recommendations  3 in 1 bedside commode;Tub/shower bench    Recommendations for Other Services Rehab consult    Precautions / Restrictions Precautions Precautions: Fall;Other (comment) Precaution Comments: Drainage at R residual limb surgical wound Restrictions Weight Bearing Restrictions: Yes RLE Weight Bearing: Non weight bearing       Mobility Bed Mobility Overal bed mobility: Needs Assistance Bed Mobility: Supine to Sit     Supine to sit: Supervision;HOB elevated     General bed mobility comments: received in recliner   Transfers Overall transfer level: Needs assistance Equipment used: Rolling walker (2 wheeled) Transfers: Sit to/from Stand Sit to Stand: Min assist;Min guard         General transfer comment: Multiple sit<>stands from EOB and recliner, cues for sequencing and correct hand placement    Balance Overall balance assessment: Needs assistance Sitting-balance support: No upper extremity supported;Feet  unsupported;Feet supported Sitting balance-Leahy Scale: Fair     Standing balance support: Bilateral upper extremity supported;During functional activity Standing balance-Leahy Scale: Poor Standing balance comment: Reliant on UE support; assist to correct instability with dynamic balance                           ADL either performed or assessed with clinical judgement   ADL Overall ADL's : Needs assistance/impaired                     Lower Body Dressing: Minimal assistance;Sitting/lateral leans Lower Body Dressing Details (indicate cue type and reason): Collaborated with pt on LB dressing techniques. Using lateral leans, pt able to demo ability to don/doff scrub pants without assistance. Pt reports previously using lateral leans due to pain prior to surgery. Pt will need increased assist for LB ADLs in standing q               General ADL Comments: Pt limited by pain today, reports familiar with lateral leans for ADLs prior to surgery.      Vision   Vision Assessment?: No apparent visual deficits   Perception     Praxis      Cognition Arousal/Alertness: Awake/alert Behavior During Therapy: WFL for tasks assessed/performed Overall Cognitive Status: Within Functional Limits for tasks assessed                                          Exercises Exercises: General Upper Extremity General Exercises - Upper Extremity Shoulder Flexion: Strengthening;Both;Seated;Theraband;10 reps Theraband Level (  Shoulder Flexion): Level 1 (Yellow) Shoulder Horizontal ABduction: Strengthening;Both;10 reps;Seated;Theraband Theraband Level (Shoulder Horizontal Abduction): Level 1 (Yellow) Elbow Flexion: Strengthening;Both;10 reps;Seated;Theraband Theraband Level (Elbow Flexion): Level 1 (Yellow) Elbow Extension: Strengthening;Both;10 reps;Seated;Theraband Theraband Level (Elbow Extension): Level 1 (Yellow) Amputee Exercises Knee Flexion:  AROM;Right;Seated Knee Extension: AROM;Right;Seated Straight Leg Raises: AROM;Right;10 reps;Seated (repeated cues to keep knee straight) Other Exercises Other Exercises: R knee flexion/extension x 10   Shoulder Instructions       General Comments Pt's daughter present during session, engaged and both asking appropriate questions for fall recovery, fall prevention, staying active after discharge.     Pertinent Vitals/ Pain       Pain Assessment: Faces Faces Pain Scale: Hurts even more Pain Location: R foot (phantom limb pain) Pain Descriptors / Indicators: Discomfort;Grimacing;Burning Pain Intervention(s): Monitored during session;Limited activity within patient's tolerance;Premedicated before session;Repositioned;Patient requesting pain meds-RN notified;Other (comment) (pt declined ice)  Home Living                                          Prior Functioning/Environment              Frequency  Min 2X/week        Progress Toward Goals  OT Goals(current goals can now be found in the care plan section)  Progress towards OT goals: Progressing toward goals  Acute Rehab OT Goals Patient Stated Goal: work hard, return to complete independence; get a prosthesis OT Goal Formulation: With patient/family Time For Goal Achievement: 05/24/20 Potential to Achieve Goals: Good ADL Goals Pt Will Perform Grooming: with modified independence;standing Pt Will Perform Lower Body Bathing: with modified independence;sitting/lateral leans;sit to/from stand Pt Will Perform Lower Body Dressing: with modified independence;sitting/lateral leans;sit to/from stand Pt Will Transfer to Toilet: with modified independence;ambulating Pt Will Perform Toileting - Clothing Manipulation and hygiene: with modified independence;sitting/lateral leans;sit to/from stand Pt Will Perform Tub/Shower Transfer: with set-up;ambulating;Tub transfer;tub bench;rolling walker Pt/caregiver will Perform  Home Exercise Program: Increased strength;Both right and left upper extremity;With theraband;Independently;With written HEP provided  Plan Discharge plan remains appropriate    Co-evaluation                 AM-PAC OT "6 Clicks" Daily Activity     Outcome Measure   Help from another person eating meals?: None Help from another person taking care of personal grooming?: A Little Help from another person toileting, which includes using toliet, bedpan, or urinal?: A Lot Help from another person bathing (including washing, rinsing, drying)?: A Lot Help from another person to put on and taking off regular upper body clothing?: A Little Help from another person to put on and taking off regular lower body clothing?: A Little 6 Click Score: 17    End of Session    OT Visit Diagnosis: Unsteadiness on feet (R26.81);Other abnormalities of gait and mobility (R26.89);Muscle weakness (generalized) (M62.81);Pain Pain - Right/Left: Right Pain - part of body: Leg   Activity Tolerance Patient limited by pain   Patient Left in chair;with call bell/phone within reach;with chair alarm set;with family/visitor present   Nurse Communication Mobility status;Patient requests pain meds;Other (comment) (request for bath)        Time: 0102-7253 OT Time Calculation (min): 33 min  Charges: OT General Charges $OT Visit: 1 Visit OT Treatments $Self Care/Home Management : 8-22 mins $Therapeutic Exercise: 8-22 mins  Layla Maw, OTR/L   Layla Maw 05/12/2020,  1:16 PM

## 2020-05-13 MED ORDER — ZOLPIDEM TARTRATE 5 MG PO TABS
5.0000 mg | ORAL_TABLET | Freq: Every evening | ORAL | Status: DC | PRN
  Administered 2020-05-13 – 2020-05-17 (×4): 5 mg via ORAL
  Filled 2020-05-13 (×4): qty 1

## 2020-05-13 NOTE — Progress Notes (Addendum)
  Progress Note    05/13/2020 3:14 PM 4 Days Post-Op  Subjective:  No complaints; resting comfortably  Afebrile HR 80's 357'S-177'L systolic 39% RA  Vitals:   05/13/20 0918 05/13/20 0919  BP: 136/87 136/87  Pulse:  85  Resp:  17  Temp:  98.6 F (37 C)  SpO2:  97%    Physical Exam: Incisions:  Retention sock in place and is clean and dry.  Pt is able to straighten his knee well.     CBC    Component Value Date/Time   WBC 9.6 05/11/2020 0304   RBC 3.62 (L) 05/11/2020 0304   HGB 11.2 (L) 05/11/2020 0304   HGB 13.1 08/31/2015 0929   HCT 34.4 (L) 05/11/2020 0304   HCT 38.5 08/31/2015 0929   PLT 444 (H) 05/11/2020 0304   PLT 217 08/31/2015 0929   MCV 95.0 05/11/2020 0304   MCV 93 08/31/2015 0929   MCH 30.9 05/11/2020 0304   MCHC 32.6 05/11/2020 0304   RDW 11.8 05/11/2020 0304   RDW 12.8 08/31/2015 0929   LYMPHSABS 1,076 03/14/2020 0919   LYMPHSABS 1.5 08/31/2015 0929   EOSABS 180 03/14/2020 0919   EOSABS 0.1 08/31/2015 0929   BASOSABS 42 03/14/2020 0919   BASOSABS 0.0 08/31/2015 0929    BMET    Component Value Date/Time   NA 136 05/11/2020 0304   NA 137 08/31/2015 0929   K 5.1 05/11/2020 0304   CL 103 05/11/2020 0304   CO2 22 05/11/2020 0304   GLUCOSE 103 (H) 05/11/2020 0304   BUN 25 (H) 05/11/2020 0304   BUN 11 08/31/2015 0929   CREATININE 1.76 (H) 05/11/2020 0304   CREATININE 1.51 (H) 03/14/2020 0919   CALCIUM 9.3 05/11/2020 0304   GFRNONAA 43 (L) 05/11/2020 0304   GFRNONAA 49 (L) 03/14/2020 0919   GFRAA 57 (L) 03/14/2020 0919    INR    Component Value Date/Time   INR 1.2 05/09/2020 0709     Intake/Output Summary (Last 24 hours) at 05/13/2020 1514 Last data filed at 05/12/2020 1700 Gross per 24 hour  Intake --  Output 600 ml  Net -600 ml     Assessment/Plan:  64 y.o. male is s/p right below knee amputation  4 Days Post-Op  -pt doing well.  Rentention sock placed yesterday. -pt's creatinine on 11/18 was 1.76.  Will recheck labs  tomorrow. -continue to work with PT -CIR insurance approval pending.   Leontine Locket, PA-C [facility] [phone] 05/13/2020 3:14 PM  I have interviewed the patient and examined the patient. I agree with the findings by the PA.  Gae Gallop, MD [phone]

## 2020-05-14 LAB — BASIC METABOLIC PANEL
Anion gap: 9 (ref 5–15)
BUN: 19 mg/dL (ref 8–23)
CO2: 25 mmol/L (ref 22–32)
Calcium: 9.2 mg/dL (ref 8.9–10.3)
Chloride: 102 mmol/L (ref 98–111)
Creatinine, Ser: 1.31 mg/dL — ABNORMAL HIGH (ref 0.61–1.24)
GFR, Estimated: >60 mL/min (ref >60)
Glucose, Bld: 87 mg/dL (ref 70–99)
Potassium: 4.5 mmol/L (ref 3.5–5.1)
Sodium: 136 mmol/L (ref 135–145)

## 2020-05-14 LAB — CBC
HCT: 30.8 % — ABNORMAL LOW (ref 39.0–52.0)
Hemoglobin: 10.2 g/dL — ABNORMAL LOW (ref 13.0–17.0)
MCH: 31 pg (ref 26.0–34.0)
MCHC: 33.1 g/dL (ref 30.0–36.0)
MCV: 93.6 fL (ref 80.0–100.0)
Platelets: 436 10*3/uL — ABNORMAL HIGH (ref 150–400)
RBC: 3.29 MIL/uL — ABNORMAL LOW (ref 4.22–5.81)
RDW: 11.7 % (ref 11.5–15.5)
WBC: 7.8 10*3/uL (ref 4.0–10.5)
nRBC: 0 % (ref 0.0–0.2)

## 2020-05-14 NOTE — Progress Notes (Signed)
   VASCULAR SURGERY ASSESSMENT & PLAN:   POD 5 RIGHT BKA: I inspected his wound today and the wound looks good.  Continue dressing changes.  Still being evaluated for CIR.  SUBJECTIVE:   No complaints this morning.  PHYSICAL EXAM:   Vitals:   05/13/20 0919 05/13/20 1612 05/13/20 1952 05/14/20 0441  BP: 136/87 127/85 124/71 (!) 151/89  Pulse: 85 72 84 77  Resp: 17 17 15 16   Temp: 98.6 F (37 C) 98.3 F (36.8 C) 98.3 F (36.8 C) 98.3 F (36.8 C)  TempSrc: Oral Oral Oral Oral  SpO2: 97% 97% 95% 99%  Weight:      Height:       Right BKA dressing changed and his incision looks fine.  I redressed this.  LABS:   Lab Results  Component Value Date   WBC 9.6 05/11/2020   HGB 11.2 (L) 05/11/2020   HCT 34.4 (L) 05/11/2020   MCV 95.0 05/11/2020   PLT 444 (H) 05/11/2020   Lab Results  Component Value Date   CREATININE 1.76 (H) 05/11/2020   Lab Results  Component Value Date   INR 1.2 05/09/2020   PROBLEM LIST:    Active Problems:   PAD (peripheral artery disease) (HCC)  CURRENT MEDS:   . amLODipine  5 mg Oral Daily  . aspirin EC  81 mg Oral Daily  . atorvastatin  20 mg Oral Daily  . cholecalciferol  5,000 Units Oral Daily  . docusate sodium  100 mg Oral Daily  . fluticasone furoate-vilanterol  1 puff Inhalation Daily  . heparin  5,000 Units Subcutaneous Q8H  . irbesartan  150 mg Oral Daily  . pantoprazole  40 mg Oral Daily  . umeclidinium bromide  1 puff Inhalation Daily  . vitamin B-12  500 mcg Oral Daily    Deitra Mayo Office: [phone] 05/14/2020

## 2020-05-15 NOTE — Plan of Care (Signed)

## 2020-05-15 NOTE — Progress Notes (Signed)
Patient has not have a BM since surgery. Offered patient dulcolax PRN.  Patient refused. Patient requests for prune juice. Ordered 2 cups of prune juice for patient.

## 2020-05-15 NOTE — H&P (Signed)
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  He was admitted on 05/09/20 for R-BKA by Dr. [name]. Post op with ABLA which was being monitored. Acute on chronic renal failure was resolving and he continues to have oozing from incision line. Therapy ongoing and CIR recommended due to functional decline in mobility and ADLs.    Review of Systems  Constitutional: Negative.   HENT: Negative.   Eyes: Negative.   Respiratory: Negative.   Cardiovascular: Negative.   Gastrointestinal: Negative.   Genitourinary: Negative.   Musculoskeletal: Negative.   Skin: Negative.   Neurological: Positive for focal weakness.  Endo/Heme/Allergies: Negative.   Psychiatric/Behavioral: Positive for depression.      Past Medical History:  Diagnosis Date  . CKD (chronic kidney disease)   . History of blood transfusion   . Hyperlipidemia   . Hypertension   . Neuromuscular disorder (HCC)    numbness feet  . Peripheral vascular disease ([place])   . Shortness of breath dyspnea    occasional     Past Surgical History:  Procedure Laterality Date  . ABDOMINAL AORTOGRAM W/LOWER EXTREMITY N/A 09/13/2019   Procedure: ABDOMINAL AORTOGRAM W/LOWER EXTREMITY;  Surgeon: Waynetta Sandy, MD;  Location: [place] [facility];  Service: Cardiovascular;  Laterality: N/A;  . AMPUTATION Right 05/09/2020   Procedure: RIGHT BELOW KNEE AMPUTATION;  Surgeon: Waynetta Sandy, MD;  Location: [place];  Service: Vascular;  Laterality: Right;  w/ a block  . COLONOSCOPY WITH PROPOFOL N/A 02/15/2016   Procedure: COLONOSCOPY WITH PROPOFOL;  Surgeon: Lucilla Lame, MD;  Location: [place];  Service: Endoscopy;  Laterality: N/A;   . FEMORAL-POPLITEAL BYPASS GRAFT Right 09/14/2019   Procedure: BYPASS GRAFT FEMORAL-POPLITEAL ARTERY;  Surgeon: Waynetta Sandy, MD;  Location: [place];  Service: Vascular;  Laterality: Right;  . HERNIA REPAIR  1999   left inguinal  . INSERTION OF ILIAC STENT Right 09/14/2019   Procedure: Insertion Of Common and External Iliac Stent;  Surgeon: Waynetta Sandy, MD;  Location: [place];  Service: Vascular;  Laterality: Right;  . LOWER EXTREMITY ANGIOGRAPHY Right 05/07/2018   Procedure: LOWER EXTREMITY ANGIOGRAPHY;  Surgeon: Algernon Huxley, MD;  Location: [place] [facility];  Service: Cardiovascular;  Laterality: Right;  . LOWER EXTREMITY ANGIOGRAPHY Right 06/25/2018   Procedure: LOWER EXTREMITY ANGIOGRAPHY;  Surgeon: Algernon Huxley, MD;  Location: [place] [facility];  Service: Cardiovascular;  Laterality: Right;  . LOWER EXTREMITY ANGIOGRAPHY Right 07/01/2018   Procedure: LOWER EXTREMITY ANGIOGRAPHY;  Surgeon: Algernon Huxley, MD;  Location: [place] [facility];  Service: Cardiovascular;  Laterality: Right;  . LOWER EXTREMITY ANGIOGRAPHY Right 08/12/2018   Procedure: LOWER EXTREMITY ANGIOGRAPHY;  Surgeon: Algernon Huxley, MD;  Location: [place] [facility];  Service: Cardiovascular;  Laterality: Right;  . LOWER EXTREMITY ANGIOGRAPHY Right 09/03/2018   Procedure: LOWER EXTREMITY ANGIOGRAPHY;  Surgeon: Algernon Huxley, MD;  Location: [place] [facility];  Service: Cardiovascular;  Laterality: Right;  . LOWER EXTREMITY ANGIOGRAPHY Right 09/04/2018   Procedure: Lower Extremity Angiography;  Surgeon: Algernon Huxley, MD;  Location: [place] [facility];  Service: Cardiovascular;  Laterality: Right;  . LOWER EXTREMITY ANGIOGRAPHY Right 10/01/2018   Procedure:  LOWER EXTREMITY ANGIOGRAPHY;  Surgeon: Algernon Huxley, MD;  Location: [place] [facility];  Service: Cardiovascular;  Laterality: Right;  . LOWER EXTREMITY ANGIOGRAPHY Right 10/01/2018   Procedure: Lower Extremity Angiography;  Surgeon: Algernon Huxley, MD;   Location: [place] [facility];  Service: Cardiovascular;  Laterality: Right;  . LOWER EXTREMITY ANGIOGRAPHY Right 10/15/2018   Procedure: LOWER EXTREMITY ANGIOGRAPHY;  Surgeon: Algernon Huxley, MD;  Location: [employer] [facility] [facility];  Service: Cardiovascular;  Laterality: Right;  . LOWER EXTREMITY ANGIOGRAPHY Left 10/16/2018   Procedure: Lower Extremity Angiography;  Surgeon: Algernon Huxley, MD;  Location: [place] [facility];  Service: Cardiovascular;  Laterality: Left;  . LOWER EXTREMITY ANGIOGRAPHY Left 01/20/2019   Procedure: LOWER EXTREMITY ANGIOGRAPHY;  Surgeon: Algernon Huxley, MD;  Location: [place] [facility];  Service: Cardiovascular;  Laterality: Left;  . LOWER EXTREMITY ANGIOGRAPHY Right 01/21/2019   Procedure: Lower Extremity Angiography;  Surgeon: Algernon Huxley, MD;  Location: [place] [facility];  Service: Cardiovascular;  Laterality: Right;  . LOWER EXTREMITY ANGIOGRAPHY Right 01/28/2019   Procedure: LOWER EXTREMITY ANGIOGRAPHY;  Surgeon: Algernon Huxley, MD;  Location: [place] [facility];  Service: Cardiovascular;  Laterality: Right;  . LOWER EXTREMITY ANGIOGRAPHY Right 01/29/2019   Procedure: Lower Extremity Angiography;  Surgeon: Algernon Huxley, MD;  Location: [place] [facility];  Service: Cardiovascular;  Laterality: Right;  . LOWER EXTREMITY ANGIOGRAPHY Right 04/04/2020   Procedure: LOWER EXTREMITY ANGIOGRAPHY;  Surgeon: Waynetta Sandy, MD;  Location: [place] [facility];  Service: Cardiovascular;  Laterality: Right;  . LOWER EXTREMITY INTERVENTION Right 07/02/2018   Procedure: LOWER EXTREMITY INTERVENTION;  Surgeon: Algernon Huxley, MD;  Location: [place] [facility];  Service: Cardiovascular;  Laterality: Right;  . PERIPHERAL VASCULAR BALLOON ANGIOPLASTY Left 04/04/2020   Procedure: PERIPHERAL VASCULAR BALLOON ANGIOPLASTY;  Surgeon: Waynetta Sandy, MD;  Location: [place] [facility];  Service: Cardiovascular;  Laterality: Left;  external iliac  . POLYPECTOMY N/A 02/15/2016    Procedure: POLYPECTOMY;  Surgeon: Lucilla Lame, MD;  Location: [place];  Service: Endoscopy;  Laterality: N/A;    Family History  Problem Relation Age of Onset  . COPD Mother   . Hypertension Father   . Heart attack Brother     Social History:  reports that he quit smoking about 16 months ago. His smoking use included cigarettes. He started smoking about 41 years ago. He has a 20.00 pack-year smoking history. He has never used smokeless tobacco. He reports current alcohol use of about 12.0 standard drinks of alcohol per week. He reports that he does not use drugs.    Allergies: No Known Allergies    Medications Prior to Admission  Medication Sig Dispense Refill  . acetaminophen (TYLENOL) 500 MG tablet Take 1,000 mg by mouth every 6 (six) hours as needed for moderate pain or headache.    . albuterol (VENTOLIN HFA) 108 (90 Base) MCG/ACT inhaler Inhale 2 puffs into the lungs every 6 (six) hours as needed for wheezing or shortness of breath. 18 g 0  . amLODipine-valsartan (EXFORGE) 5-160 MG tablet Take 1 tablet by mouth daily. In place of amlodipine 5 mg (Patient taking differently: Take 1 tablet by mouth daily. ) 90 tablet 0  . aspirin EC 81 MG tablet Take 1 tablet (81 mg total) by mouth daily. 90 tablet 3  . atorvastatin (LIPITOR) 20 MG tablet Take 1 tablet (20 mg total) by mouth daily. 90 tablet 1  . Cholecalciferol (  DIALYVITE VITAMIN D 5000) 125 MCG (5000 UT) capsule Take 5,000 Units by mouth daily.    . Cyanocobalamin (B-12) 500 MCG SUBL Place 1 tablet under the tongue daily. (Patient taking differently: Place 500 mcg under the tongue daily. ) 30 tablet 0  . Fluticasone-Umeclidin-Vilant (TRELEGY ELLIPTA) 100-62.5-25 MCG/INH AEPB Inhale 1 puff into the lungs daily as needed (shortness of breath). 60 each 5  . SANTYL ointment Apply 1 application topically daily.     . clopidogrel (PLAVIX) 75 MG tablet Take 75 mg by mouth daily. (Patient not taking: Reported on 05/05/2020)     . thiamine (VITAMIN B-1) 50 MG tablet Take 1 tablet (50 mg total) by mouth daily. (Patient not taking: Reported on 05/05/2020) 30 tablet 2    Drug Regimen Review  Drug regimen was reviewed and remains appropriate with no significant issues identified  Home: Home Living Family/patient expects to be discharged to:: Private residence Living Arrangements:  (lives with daughter and her wife) Available Help at Discharge: Family, Available PRN/intermittently Type of Home: House Home Access: Stairs to enter Technical brewer of Steps: 1 Home Layout: Two level, Able to live on main level with bedroom/bathroom Alternate Level Stairs-Number of Steps: 12 Bathroom Shower/Tub: Chiropodist: Standard Bathroom Accessibility: Yes Home Equipment: Kasandra Knudsen - quad, Environmental consultant - 2 wheels, Wheelchair - manual Additional Comments: plans to get a tub bench  Lives With: Daughter   Functional History: Prior Function Level of Independence: Independent Comments: Reports independent IADLs, ADLS, driving, short community distances  Functional Status:  Mobility: Bed Mobility Overal bed mobility: Needs Assistance Bed Mobility: Supine to Sit, Sit to Supine Rolling: Supervision, Min guard Supine to sit: Supervision, HOB elevated Sit to supine: Supervision General bed mobility comments: use of bed features, increased time to initiate Transfers Overall transfer level: Needs assistance Equipment used: Rolling walker (2 wheeled) Transfers: Sit to/from Stand Sit to Stand: Min assist General transfer comment: pt limited by pain when standing at RW, able to remain standing ~2 minutes to perform RLE therex but refusing further stands/transfer to bed or w/c due to severe pain Ambulation/Gait Ambulation/Gait assistance:  (pt defers this session 2/2 pain) Gait Distance (Feet): 2 Feet Assistive device: Rolling walker (2 wheeled) Gait Pattern/deviations: Step-to pattern General Gait Details: Pt  able to minimally hop on LLE with RW, difficulty clearing foot, limited by pain/fatigue weakness, minA to correct instability; pt declining further ambulation distance secondary to "I overdid it last time" - therefore opted to focus on transfer training    ADL: ADL Overall ADL's : Needs assistance/impaired Eating/Feeding: Independent, Sitting Grooming: Minimal assistance, Standing, Oral care Grooming Details (indicate cue type and reason): Min A for oral care standing at sink to ensure safety and balance, assistance for setting up task due to difficulty maintaining balance with bimanual tasks Upper Body Bathing: Set up, Sitting Lower Body Bathing: Moderate assistance, Sitting/lateral leans, Sit to/from stand Upper Body Dressing : Set up, Sitting Lower Body Dressing: Minimal assistance, Sitting/lateral leans Lower Body Dressing Details (indicate cue type and reason): Collaborated with pt on LB dressing techniques. Using lateral leans, pt able to demo ability to don/doff scrub pants without assistance. Pt reports previously using lateral leans due to pain prior to surgery. Pt will need increased assist for LB ADLs in standing q Toilet Transfer: Min guard, Anterior/posterior, BSC Toilet Transfer Details (indicate cue type and reason): min guard for AP transfer to [facility]. OT assisting in stabilizing [place] Manipulation and Hygiene: Moderate assistance, Sitting/lateral lean,  Sit to/from stand Functional mobility during ADLs: Minimal assistance, Rolling walker, Cueing for sequencing, Cueing for safety General ADL Comments: Pt limited by pain today, reports familiar with lateral leans for ADLs prior to surgery.   Cognition: Cognition Overall Cognitive Status: Within Functional Limits for tasks assessed Orientation Level: Oriented X4 Cognition Arousal/Alertness: Awake/alert Behavior During Therapy: WFL for tasks assessed/performed Overall Cognitive Status: Within Functional Limits  for tasks assessed General Comments: pleasantly cooperative  Physical Exam: Blood pressure 97/67, pulse 77, temperature 97.7 F (36.5 C), temperature source Oral, resp. rate 17, height 5\' 8"  (1.727 m), weight 66.7 kg, SpO2 94 %. Physical Exam  General: Alert and oriented x 3, No apparent distress HEENT: Head is normocephalic, atraumatic, PERRLA, EOMI, sclera anicteric, oral mucosa pink and moist, dentition intact, ext ear canals clear,  Neck: Supple without JVD or lymphadenopathy Heart: Reg rate and rhythm. No murmurs rubs or gallops Chest: CTA bilaterally without wheezes, rales, or rhonchi; no distress Abdomen: Soft, non-tender, non-distended, bowel sounds positive. Extremities: No clubbing, cyanosis, or edema. Pulses are 2+ Skin: R BKA incision healing well, some oozing along staples. Neuro: Pt is cognitively appropriate with normal insight, memory, and awareness. Cranial nerves 2-12 are intact. Sensory exam is normal. Reflexes are 2+ in all 4's. Fine motor coordination is intact. No tremors. Motor function is grossly 5/5.  Psych: Pt's affect is appropriate. Pt is cooperative  No results found for this or any previous visit (from the past 48 hour(s)). No results found.  Medical Problem List and Plan: 1.  Impaired mobility and ADLs secondary to right BKA  -patient may not shower  -ELOS/Goals: modi 10-14 days.  2.  Antithrombotics: -DVT/anticoagulation:  Pharmaceutical: Lovenox  -antiplatelet therapy: on ASA 3. Pain Management: has been using oxycodone 10 mg every 4 hours.  4. Mood: LCSW to follow for evaluation and support.   -antipsychotic agents: N/a 5. Neuropsych: This patient is capable of making decisions on his own behalf. 6. Skin/Wound Care: Monitor wound drainage/healing. Will add multivitamin, Vitamin C and protein supplement to promote healing.  7. Fluids/Electrolytes/Nutrition: Monitor I/O. Discontinue IVF. Check BMET in am.  8. ABLA: Monitor with serial check. Hgb is  10.2 on 11/21. Will order CBC for am.  9. Acute on chronic renal failure (CKD3): Has improved --1.54-->1.76-->1.31. 10. HTN: Monitor BP tid--continue Avapro and amlodipine-valsartan 5-160mg . Has been soft to normotensive.  11. COPD: Respiratory status stable on Breo and Incruse. Currently satting well on room air.   Bary Leriche, PA-C 05/16/2020   I have personally performed a face to face diagnostic evaluation, including, but not limited to relevant history and physical exam findings, of this patient and developed relevant assessment and plan.  Additionally, I have reviewed and concur with the physician assistant's documentation above.  Leeroy Cha, MD

## 2020-05-15 NOTE — Progress Notes (Signed)
Inpatient Rehabilitation Admissions Coordinator  I continue to await [facility] approval to admit patient to [facility].  Danne Baxter, RN, MSN Rehab Admissions Coordinator [phone] 05/15/2020 11:10 AM

## 2020-05-15 NOTE — Progress Notes (Signed)
Physical Therapy Treatment Patient Details Name: Ryan [name] MRN: [id number] DOB: 1956/03/27 Today's Date: 05/15/2020    History of Present Illness Pt is a 64 y.o. male with h/o RLE pain after multiple surgical attempts for revascularization, now admitted for R BKA on 05/09/20 . Other PMH includes PVD, HTN, CKD.    PT Comments    Pt supine on arrival, agreeable to therapy session and with good participation and tolerance for mobility. Pt limited due to severe RLE pain in stance and agreeable to one standing trial and seated/standing RLE AROM therapeutic exercises as detailed below and given HEP handout to perform TID as able (Link: [email or web address] Access Code: [email or web address]) Pt able to pull >2,000 on IS, encouraged to perform hourly and reviewed pressure offloading techniques in bed as pt not agreeable to sit up in chair due to pain at this time.. Pt continues to benefit from PT services to progress toward functional mobility goals. D/C recs below remain appropriate.  Follow Up Recommendations  CIR;Supervision for mobility/OOB     Equipment Recommendations  Rolling walker with 5" wheels;3in1 (PT)    Recommendations for Other Services       Precautions / Restrictions Precautions Precautions: Fall Precaution Comments: Drainage at R residual limb surgical wound Restrictions Weight Bearing Restrictions: Yes RLE Weight Bearing: Non weight bearing    Mobility  Bed Mobility Overal bed mobility: Needs Assistance Bed Mobility: Supine to Sit;Sit to Supine     Supine to sit: Supervision;HOB elevated Sit to supine: Supervision   General bed mobility comments: use of bed features, increased time to initiate  Transfers Overall transfer level: Needs assistance Equipment used: Rolling walker (2 wheeled) Transfers: Sit to/from Stand Sit to Stand: Min assist         General transfer comment: pt limited by pain when standing at RW, able to remain standing ~2 minutes to  perform RLE therex but refusing further stands/transfer to bed or w/c due to severe pain  Ambulation/Gait Ambulation/Gait assistance:  (pt defers this session 2/2 pain)               Stairs             Wheelchair Mobility    Modified Rankin (Stroke Patients Only)       Balance Overall balance assessment: Needs assistance Sitting-balance support: No upper extremity supported;Feet supported Sitting balance-Leahy Scale: Fair     Standing balance support: Bilateral upper extremity supported;During functional activity Standing balance-Leahy Scale: Poor Standing balance comment: reliant on BUE support and external support during dynamic standing tasks                            Cognition Arousal/Alertness: Awake/alert Behavior During Therapy: WFL for tasks assessed/performed Overall Cognitive Status: Within Functional Limits for tasks assessed                                 General Comments: pleasantly cooperative      Exercises General Exercises - Lower Extremity Ankle Circles/Pumps: AROM;Left;10 reps;Supine Gluteal Sets: AROM;Strengthening;10 reps;Seated Long Arc Quad: AROM;Both;10 reps;Seated Amputee Exercises Knee Flexion: AROM;Right;Seated;10 reps Knee Extension: AROM;Right;Seated;10 reps Other Exercises Other Exercises: Standing RLE hip extension AROM 1x10 reps ea    General Comments General comments (skin integrity, edema, etc.): reviewed phantom pain reduction techniques; of note, pt had pillow under distal residual limb and instructed on positioning with limbs close together and  no pillow under R limb s/p BKA, pt receptive but will need reinforcement; reviewed pressure offloading strategies as pt has been in bed for extended periods as well as use of IS (pt pulling ~2,000 on repeated attempts)      Pertinent Vitals/Pain Pain Assessment: 0-10 Pain Score: 8  Pain Location: R foot (phantom limb pain), 5/10 resting increased to  8-9 when standing Pain Descriptors / Indicators: Discomfort;Grimacing;Burning Pain Intervention(s): Monitored during session;Premedicated before session;Repositioned;Patient requesting pain meds-RN notified (pt deferred ice pack)    Home Living                      Prior Function            PT Goals (current goals can now be found in the care plan section) Acute Rehab PT Goals Patient Stated Goal: work hard, return to complete independence; get a prosthesis PT Goal Formulation: With patient Time For Goal Achievement: 05/24/20 Potential to Achieve Goals: Good Progress towards PT goals: Progressing toward goals    Frequency    Min 3X/week      PT Plan Frequency needs to be updated    Co-evaluation              AM-PAC PT "6 Clicks" Mobility   Outcome Measure  Help needed turning from your back to your side while in a flat bed without using bedrails?: None Help needed moving from lying on your back to sitting on the side of a flat bed without using bedrails?: None Help needed moving to and from a bed to a chair (including a wheelchair)?: A Little Help needed standing up from a chair using your arms (e.g., wheelchair or bedside chair)?: A Little Help needed to walk in hospital room?: A Little Help needed climbing 3-5 steps with a railing? : Total 6 Click Score: 18    End of Session Equipment Utilized During Treatment: Gait belt Activity Tolerance: Patient tolerated treatment well;Patient limited by pain Patient left: in bed;with call bell/phone within reach;with bed alarm set (pt defers SCD on LLE or ice to surgical leg) Nurse Communication: Mobility status PT Visit Diagnosis: Other abnormalities of gait and mobility (R26.89)     Time: 4332-9518 PT Time Calculation (min) (ACUTE ONLY): 18 min  Charges:  $Therapeutic Exercise: 8-22 mins                     Seryna Marek P., PTA Acute Rehabilitation Services Pager: [phone] Office: [place] 05/15/2020, 10:02 AM

## 2020-05-15 NOTE — Progress Notes (Addendum)
  Progress Note    05/15/2020 7:28 AM 6 Days Post-Op  Subjective: burning pain in R BKA stump   Vitals:   05/14/20 2008 05/15/20 0422  BP: 118/78 138/78  Pulse: 80 85  Resp: 16 15  Temp: 97.9 F (36.6 C) 98.6 F (37 C)  SpO2: 100% 97%   Physical Exam: Cardiac:  regular Lungs:  Non labored Incisions:  Right BKA staples intact. Viable flaps. No drainage. Dressings reapplied Extremities:  Well perfused and warm Neurologic: alert and oriented  CBC    Component Value Date/Time   WBC 7.8 05/14/2020 0603   RBC 3.29 (L) 05/14/2020 0603   HGB 10.2 (L) 05/14/2020 0603   HGB 13.1 08/31/2015 0929   HCT 30.8 (L) 05/14/2020 0603   HCT 38.5 08/31/2015 0929   PLT 436 (H) 05/14/2020 0603   PLT 217 08/31/2015 0929   MCV 93.6 05/14/2020 0603   MCV 93 08/31/2015 0929   MCH 31.0 05/14/2020 0603   MCHC 33.1 05/14/2020 0603   RDW 11.7 05/14/2020 0603   RDW 12.8 08/31/2015 0929   LYMPHSABS 1,076 03/14/2020 0919   LYMPHSABS 1.5 08/31/2015 0929   EOSABS 180 03/14/2020 0919   EOSABS 0.1 08/31/2015 0929   BASOSABS 42 03/14/2020 0919   BASOSABS 0.0 08/31/2015 0929    BMET    Component Value Date/Time   NA 136 05/14/2020 0603   NA 137 08/31/2015 0929   K 4.5 05/14/2020 0603   CL 102 05/14/2020 0603   CO2 25 05/14/2020 0603   GLUCOSE 87 05/14/2020 0603   BUN 19 05/14/2020 0603   BUN 11 08/31/2015 0929   CREATININE 1.31 (H) 05/14/2020 0603   CREATININE 1.51 (H) 03/14/2020 0919   CALCIUM 9.2 05/14/2020 0603   GFRNONAA >60 05/14/2020 0603   GFRNONAA 49 (L) 03/14/2020 0919   GFRAA 57 (L) 03/14/2020 0919    INR    Component Value Date/Time   INR 1.2 05/09/2020 0709     Intake/Output Summary (Last 24 hours) at 05/15/2020 0728 Last data filed at 05/15/2020 0500 Gross per 24 hour  Intake 600 ml  Output 1750 ml  Net -1150 ml     Assessment/Plan:  64 y.o. male is s/p R BKA 6 Days Post-Op. R BKA intact and healing well. Transition to using retention sock. Okay for  discharge from vascular standpoint. Pending Dispo- CIR. Has follow up arranged on 06/09/20 for staple removal in [facility] office  Marval Regal [facility] [phone] 05/15/2020 7:28 AM   I have independently interviewed and examined patient and agree with PA assessment and plan above.   Yosgar Demirjian C. [name], MD [facility] [place] Office: [phone] Pager: [phone]

## 2020-05-15 NOTE — Plan of Care (Signed)

## 2020-05-16 NOTE — Progress Notes (Addendum)
  Progress Note    05/16/2020 8:38 AM 7 Days Post-Op  Subjective:  Looks good.  Sitting up in bed eating breakfast.  No complaints-said they took the sock off and wrapped with ace bc it was too tight.  Afebrile HR 70's 91'P-915'A systolic 56% RA  Vitals:   05/16/20 0447 05/16/20 0830  BP: 132/89 97/67  Pulse: 76 78  Resp: 17 17  Temp: 97.7 F (36.5 C)   SpO2: 98% 94%    Physical Exam: Incisions:  Right BKA wrapped and dressing is clean.  I did not take the dressing off as it was checked yesterday and pt is eating breakfast.    CBC    Component Value Date/Time   WBC 7.8 05/14/2020 0603   RBC 3.29 (L) 05/14/2020 0603   HGB 10.2 (L) 05/14/2020 0603   HGB 13.1 08/31/2015 0929   HCT 30.8 (L) 05/14/2020 0603   HCT 38.5 08/31/2015 0929   PLT 436 (H) 05/14/2020 0603   PLT 217 08/31/2015 0929   MCV 93.6 05/14/2020 0603   MCV 93 08/31/2015 0929   MCH 31.0 05/14/2020 0603   MCHC 33.1 05/14/2020 0603   RDW 11.7 05/14/2020 0603   RDW 12.8 08/31/2015 0929   LYMPHSABS 1,076 03/14/2020 0919   LYMPHSABS 1.5 08/31/2015 0929   EOSABS 180 03/14/2020 0919   EOSABS 0.1 08/31/2015 0929   BASOSABS 42 03/14/2020 0919   BASOSABS 0.0 08/31/2015 0929    BMET    Component Value Date/Time   NA 136 05/14/2020 0603   NA 137 08/31/2015 0929   K 4.5 05/14/2020 0603   CL 102 05/14/2020 0603   CO2 25 05/14/2020 0603   GLUCOSE 87 05/14/2020 0603   BUN 19 05/14/2020 0603   BUN 11 08/31/2015 0929   CREATININE 1.31 (H) 05/14/2020 0603   CREATININE 1.51 (H) 03/14/2020 0919   CALCIUM 9.2 05/14/2020 0603   GFRNONAA >60 05/14/2020 0603   GFRNONAA 49 (L) 03/14/2020 0919   GFRAA 57 (L) 03/14/2020 0919    INR    Component Value Date/Time   INR 1.2 05/09/2020 0709     Intake/Output Summary (Last 24 hours) at 05/16/2020 0838 Last data filed at 05/16/2020 0500 Gross per 24 hour  Intake 720 ml  Output 1850 ml  Net -1130 ml     Assessment/Plan:  65 y.o. male is s/p right below  knee amputation  7 Days Post-Op  -pt doing well.  Awaiting CIR insurance approval. -encouraged pt to continue to work on straightening his knee   Leontine Locket, PA-C [facility] [phone] 05/16/2020 8:38 AM    I have independently interviewed and examined patient and agree with PA assessment and plan above.   Darnisha Vernet C. [name], MD [facility] [place] Office: [phone] Pager: [phone]

## 2020-05-16 NOTE — Progress Notes (Signed)
Inpatient Rehabilitation Admissions Coordinator  I met with patient at bedside to keep updated on progress with insurance authorization. I have reached out to a supervisor at [facility] to assist with obtaining determination for CIR as insurance authorization was begun on 11/17.  Danne Baxter, RN, MSN Rehab Admissions Coordinator [phone] 05/16/2020 10:31 AM

## 2020-05-16 NOTE — Progress Notes (Signed)
Physical Therapy Treatment Patient Details Name: Ryan [name] MRN: [id number] DOB: 05-Nov-1955 Today's Date: 05/16/2020    History of Present Illness Pt is a 64 y.o. male with h/o RLE pain after multiple surgical attempts for revascularization, now admitted for R BKA on 05/09/20 . Other PMH includes PVD, HTN, CKD.    PT Comments    Pt up in chair on arrival, agreeable to therapy session and with good participation and improved tolerance for standing/mobility tasks compared with previous session. Pt able to progress transfers to min guard using RW and progressed gait distance to 31ft using RW and up to minA. Pt performed wheelchair navigation activity ~33ft in hallway for BUE strengthening with good tolerance. Pt benefited from cushioned board placed under wheelchair cushion to support R residual limb in extension during wheelchair mobility. Pt continues to benefit from PT services to progress toward functional mobility goals. D/C recs below remain appropriate.  Follow Up Recommendations  CIR;Supervision for mobility/OOB     Equipment Recommendations  Rolling walker with 5" wheels;3in1 (PT)    Recommendations for Other Services Rehab consult     Precautions / Restrictions Precautions Precautions: Fall Restrictions Weight Bearing Restrictions: Yes RLE Weight Bearing: Non weight bearing    Mobility  Bed Mobility Overal bed mobility: Modified Independent Bed Mobility: Supine to Sit     Supine to sit: Modified independent (Device/Increase time);HOB elevated     General bed mobility comments: pt up in chair pre/post session  Transfers Overall transfer level: Needs assistance Equipment used: Rolling walker (2 wheeled) Transfers: Sit to/from [facility];Anterior-Posterior Transfer Sit to Stand: Min guard Stand pivot transfers: Engineer, building services transfers: Min guard   General transfer comment: Pt performed sit<>stand from recliner<>wheelchair  with min guard, no LOB  Ambulation/Gait Ambulation/Gait assistance: Herbalist (Feet): 12 Feet Assistive device: Rolling walker (2 wheeled) Gait Pattern/deviations: Step-to pattern Gait velocity: grossly decreased   General Gait Details: improved L foot clearance this session, small hops, cues for press and glide technique with RW rather than fatiguing up-and-down hops   Theme park manager mobility: Yes Wheelchair propulsion: Both upper extremities Wheelchair parts: Supervision/cueing Distance: 300 Wheelchair Assistance Details (indicate cue type and reason): vcs for use of brakes pre/post transfers as well as improved technique to propel and perform pivot turns, pt with good information carryover and min cues for technique/energy conservation provided throughout  Modified Rankin (Stroke Patients Only)       Balance Overall balance assessment: Needs assistance Sitting-balance support: No upper extremity supported;Feet supported Sitting balance-Leahy Scale: Fair     Standing balance support: Bilateral upper extremity supported;During functional activity Standing balance-Leahy Scale: Poor Standing balance comment: reliant on BUE support and external support during dynamic standing tasks                            Cognition Arousal/Alertness: Awake/alert Behavior During Therapy: WFL for tasks assessed/performed Overall Cognitive Status: Within Functional Limits for tasks assessed                                 General Comments: pleasantly cooperative      Exercises Other Exercises Other Exercises: majority of wheelchair mobility billed as TE for BUE strengthening     General Comments General comments (skin integrity, edema, etc.):  Continued to encourage pt to massage residual limb, complete UB/LB exercises       Pertinent Vitals/Pain Pain Assessment: 0-10 Pain Score: 6   Pain Location: R foot, phantom limb pain Pain Descriptors / Indicators: Discomfort;Grimacing;Burning Pain Intervention(s): Monitored during session;Premedicated before session;Repositioned    Home Living                      Prior Function            PT Goals (current goals can now be found in the care plan section) Acute Rehab PT Goals Patient Stated Goal: work hard, return to complete independence; get a prosthesis PT Goal Formulation: With patient Time For Goal Achievement: 05/24/20 Potential to Achieve Goals: Good Progress towards PT goals: Progressing toward goals    Frequency    Min 3X/week      PT Plan Current plan remains appropriate    Co-evaluation              AM-PAC PT "6 Clicks" Mobility   Outcome Measure  Help needed turning from your back to your side while in a flat bed without using bedrails?: None Help needed moving from lying on your back to sitting on the side of a flat bed without using bedrails?: None Help needed moving to and from a bed to a chair (including a wheelchair)?: A Little Help needed standing up from a chair using your arms (e.g., wheelchair or bedside chair)?: A Little Help needed to walk in hospital room?: A Little Help needed climbing 3-5 steps with a railing? : Total 6 Click Score: 18    End of Session Equipment Utilized During Treatment: Gait belt Activity Tolerance: Patient tolerated treatment well Patient left: in chair;with call bell/phone within reach;with chair alarm set Nurse Communication: Mobility status PT Visit Diagnosis: Other abnormalities of gait and mobility (R26.89)     Time: 1791-5056 PT Time Calculation (min) (ACUTE ONLY): 23 min  Charges:  $Therapeutic Exercise: 8-22 mins $Therapeutic Activity: 8-22 mins                     Ryan Khanna P., PTA Acute Rehabilitation Services Pager: [phone] Office: [place] 05/16/2020, 2:05 PM

## 2020-05-16 NOTE — Progress Notes (Signed)
Occupational Therapy Treatment Patient Details Name: Ryan [name] MRN: [id number] DOB: March 11, 1956 Today's Date: 05/16/2020    History of present illness Pt is a 64 y.o. male with h/o RLE pain after multiple surgical attempts for revascularization, now admitted for R BKA on 05/09/20 . Other PMH includes PVD, HTN, CKD.   OT comments  Pt progressing well towards OT goals and remains motivated to return to independence. Pt completed various transfers to [facility], chair with min guard assist to ensure safety as pt still poses a high fall risk. Pt able to demonstrate toileting tasks with Min A for clothing mgmt using lateral leans. Pt with an increase in residual limb pain when sitting EOB or standing; encouraged pt to exercise and massage residual limb often for desensitization with pt verbalizing understanding. Continue to recommend CIR for intensive therapies for ADLs, IADLs and mobility.    Follow Up Recommendations  CIR;Supervision/Assistance - 24 hour    Equipment Recommendations  3 in 1 bedside commode;Tub/shower bench    Recommendations for Other Services Rehab consult    Precautions / Restrictions Precautions Precautions: Fall Restrictions Weight Bearing Restrictions: Yes RLE Weight Bearing: Non weight bearing       Mobility Bed Mobility Overal bed mobility: Modified Independent Bed Mobility: Supine to Sit     Supine to sit: Modified independent (Device/Increase time);HOB elevated        Transfers Overall transfer level: Needs assistance Equipment used: Rolling walker (2 wheeled) Transfers: Sit to/from [facility];Anterior-Posterior Transfer Sit to Stand: Min guard Stand pivot transfers: Min guard   Anterior-Posterior transfers: Min guard   General transfer comment: Min guard for AP transfer bed <> BSC with OT assist in stabilizing. Pt then agreeable to complete stand pivot from bed > recliner using RW. improved gaining of balance with standing     Balance Overall balance assessment: Needs assistance Sitting-balance support: No upper extremity supported;Feet supported Sitting balance-Leahy Scale: Fair     Standing balance support: Bilateral upper extremity supported;During functional activity Standing balance-Leahy Scale: Poor Standing balance comment: reliant on BUE support and external support during dynamic standing tasks                           ADL either performed or assessed with clinical judgement   ADL Overall ADL's : Needs assistance/impaired                         Toilet Transfer: Min guard;Anterior/posterior;BSC Toilet Transfer Details (indicate cue type and reason): min guard for AP transfer to [facility]. OT assisting in stabilizing BSC  Toileting- Clothing Manipulation and Hygiene: Minimal assistance;Sitting/lateral lean Toileting - Clothing Manipulation Details (indicate cue type and reason): Min A for clothing mgmt. Pt able to use lateral leans for posterior hygiene       General ADL Comments: Pt with increased pain when residual limb off of bed or when standing. Continues to be motivated to improve despite pain     Vision   Vision Assessment?: No apparent visual deficits   Perception     Praxis      Cognition Arousal/Alertness: Awake/alert Behavior During Therapy: WFL for tasks assessed/performed Overall Cognitive Status: Within Functional Limits for tasks assessed                                 General Comments: pleasantly cooperative  Exercises     Shoulder Instructions       General Comments Continued to encourage pt to massage residual limb, complete UB/LB exercises     Pertinent Vitals/ Pain       Pain Assessment: 0-10 Pain Score: 6  Pain Location: R foot, phantom limb pain Pain Descriptors / Indicators: Discomfort;Grimacing;Burning Pain Intervention(s): Monitored during session;Patient requesting pain meds-RN notified;Repositioned  Home  Living                                          Prior Functioning/Environment              Frequency  Min 2X/week        Progress Toward Goals  OT Goals(current goals can now be found in the care plan section)  Progress towards OT goals: Progressing toward goals  Acute Rehab OT Goals Patient Stated Goal: work hard, return to complete independence; get a prosthesis OT Goal Formulation: With patient/family Time For Goal Achievement: 05/24/20 Potential to Achieve Goals: Good ADL Goals Pt Will Perform Grooming: with modified independence;standing Pt Will Perform Lower Body Bathing: with modified independence;sitting/lateral leans;sit to/from stand Pt Will Perform Lower Body Dressing: with modified independence;sitting/lateral leans;sit to/from stand Pt Will Transfer to Toilet: with modified independence;ambulating Pt Will Perform Toileting - Clothing Manipulation and hygiene: with modified independence;sitting/lateral leans;sit to/from stand Pt Will Perform Tub/Shower Transfer: with set-up;ambulating;Tub transfer;tub bench;rolling walker Pt/caregiver will Perform Home Exercise Program: Increased strength;Both right and left upper extremity;With theraband;Independently;With written HEP provided  Plan Discharge plan remains appropriate    Co-evaluation                 AM-PAC OT "6 Clicks" Daily Activity     Outcome Measure   Help from another person eating meals?: None Help from another person taking care of personal grooming?: A Little Help from another person toileting, which includes using toliet, bedpan, or urinal?: A Little Help from another person bathing (including washing, rinsing, drying)?: A Lot Help from another person to put on and taking off regular upper body clothing?: A Little Help from another person to put on and taking off regular lower body clothing?: A Little 6 Click Score: 18    End of Session Equipment Utilized During  Treatment: Gait belt;Rolling walker  OT Visit Diagnosis: Unsteadiness on feet (R26.81);Other abnormalities of gait and mobility (R26.89);Muscle weakness (generalized) (M62.81);Pain Pain - Right/Left: Right Pain - part of body: Leg   Activity Tolerance Patient tolerated treatment well   Patient Left in chair;with call bell/phone within reach;with chair alarm set   Nurse Communication Mobility status;Patient requests pain meds;Weight bearing status;Other (comment) (BM)        Time: 5945-8592 OT Time Calculation (min): 30 min  Charges: OT General Charges $OT Visit: 1 Visit OT Treatments $Self Care/Home Management : 23-37 mins  Layla Maw, OTR/L   Layla Maw 05/16/2020, 12:15 PM

## 2020-05-16 NOTE — Plan of Care (Signed)
  Problem: Education: Goal: Knowledge of General Education information will improve Description: Including pain rating scale, medication(s)/side effects and non-pharmacologic comfort measures Outcome: Progressing   Problem: Health Behavior/Discharge Planning: Goal: Ability to manage health-related needs will improve Outcome: Progressing   Problem: Clinical Measurements: Goal: Ability to maintain clinical measurements within normal limits will improve Outcome: Progressing Goal: Will remain free from infection Outcome: Progressing Goal: Diagnostic test results will improve Outcome: Progressing Goal: Respiratory complications will improve Outcome: Progressing Goal: Cardiovascular complication will be avoided Outcome: Progressing   Problem: Elimination: Goal: Will not experience complications related to bowel motility Outcome: Progressing Goal: Will not experience complications related to urinary retention Outcome: Progressing   Problem: Safety: Goal: Ability to remain free from injury will improve Outcome: Progressing   Problem: Skin Integrity: Goal: Risk for impaired skin integrity will decrease Outcome: Progressing   Problem: Pain Managment: Goal: General experience of comfort will improve Outcome: Progressing

## 2020-05-16 NOTE — Discharge Summary (Signed)
Discharge Summary    TRICE ASPINALL 11-10-1955 64 y.o. male  [id number]  Admission Date: 05/09/2020  Discharge Date: 05/17/20 Physician: Thomes Lolling*  Admission Diagnosis: PAD (peripheral artery disease) [facility]) [I73.9]   HPI:   This is a 64 y.o. male has history of extensive stenting of his right lower extremity performed at outside institutions.  I then performed a right anterior tibial artery bypass with composite graft and vein and common femoral endarterectomy with retrograde stenting of his external iliac artery.  Subsequently this has occluded.  He has a wound on his right leg which is failed to heal this is now necrotic.  Previously was placed on antibiotics not on them now.  Denies any fevers.  He has severe pain in his right lower extremity.  We recently performed angiography with stent of his left external iliac artery but no intervention was undertaken on the right.  He is here today to discuss amputation versus repeat bypass.  Hospital Course:  The patient was admitted to the hospital and taken to the operating room on 05/09/2020 and underwent: Right BKA    The pt tolerated the procedure well and was transported to the PACU in good condition.   Pt has done very well post operatively.  He did have some bleeding from the lateral staple line but this has resolved.  He had a creatinine of 1.76 on 11/18 and this decreased to 1.31 on 11/21.  The remainder of the hospital course consisted of increasing mobilization and increasing intake of solids without difficulty.  He is stable for discharge to CIR.  CBC    Component Value Date/Time   WBC 7.8 05/14/2020 0603   RBC 3.29 (L) 05/14/2020 0603   HGB 10.2 (L) 05/14/2020 0603   HGB 13.1 08/31/2015 0929   HCT 30.8 (L) 05/14/2020 0603   HCT 38.5 08/31/2015 0929   PLT 436 (H) 05/14/2020 0603   PLT 217 08/31/2015 0929   MCV 93.6 05/14/2020 0603   MCV 93 08/31/2015 0929   MCH 31.0 05/14/2020 0603   MCHC 33.1  05/14/2020 0603   RDW 11.7 05/14/2020 0603   RDW 12.8 08/31/2015 0929   LYMPHSABS 1,076 03/14/2020 0919   LYMPHSABS 1.5 08/31/2015 0929   EOSABS 180 03/14/2020 0919   EOSABS 0.1 08/31/2015 0929   BASOSABS 42 03/14/2020 0919   BASOSABS 0.0 08/31/2015 0929    BMET    Component Value Date/Time   NA 136 05/14/2020 0603   NA 137 08/31/2015 0929   K 4.5 05/14/2020 0603   CL 102 05/14/2020 0603   CO2 25 05/14/2020 0603   GLUCOSE 87 05/14/2020 0603   BUN 19 05/14/2020 0603   BUN 11 08/31/2015 0929   CREATININE 1.31 (H) 05/14/2020 0603   CREATININE 1.51 (H) 03/14/2020 0919   CALCIUM 9.2 05/14/2020 0603   GFRNONAA >60 05/14/2020 0603   GFRNONAA 49 (L) 03/14/2020 0919   GFRAA 57 (L) 03/14/2020 0919        Discharge Diagnosis:  PAD (peripheral artery disease) ([place]) [I73.9]  Secondary Diagnosis: Patient Active Problem List   Diagnosis Date Noted   Ischemia of right lower extremity 01/28/2019   Iron deficiency anemia 11/23/2018   Stage 3 chronic kidney disease ([place]) 10/21/2018   Atherosclerosis of native arteries of extremity with rest pain ([place]) 10/13/2018   PAD (peripheral artery disease) ([place]) 07/21/2018   Ischemic leg 06/25/2018   Claudication ([place]) 03/17/2018   B12 deficiency 03/12/2018   Vitamin B1 deficiency 03/12/2018   Varicose  veins of leg with pain, right 03/12/2018   Enlarged thoracic aorta ([place]) 02/24/2018   Alcoholism /alcohol abuse 02/24/2018   Paresthesia 02/24/2018   Ectatic aorta ([place]) 02/20/2018   Emphysema lung ([place]) 02/20/2018   Atherosclerosis of aorta ([place]) 02/20/2018   Encounter for tobacco use cessation counseling    Benign neoplasm of cecum    Benign neoplasm of transverse colon    Benign neoplasm of sigmoid colon    Hypertension, benign 08/31/2015   Tobacco use 08/31/2015   Past Medical History:  Diagnosis Date   CKD (chronic kidney disease)    History of blood transfusion    Hyperlipidemia    Hypertension     Neuromuscular disorder (HCC)    numbness feet   Peripheral vascular disease (HCC)    Shortness of breath dyspnea    occasional      Allergies as of 05/17/2020   No Known Allergies     Medication List    STOP taking these medications   Santyl ointment Generic drug: collagenase     TAKE these medications   acetaminophen 500 MG tablet Commonly known as: TYLENOL Take 1,000 mg by mouth every 6 (six) hours as needed for moderate pain or headache.   albuterol 108 (90 Base) MCG/ACT inhaler Commonly known as: VENTOLIN HFA Inhale 2 puffs into the lungs every 6 (six) hours as needed for wheezing or shortness of breath.   amLODipine-valsartan 5-160 MG tablet Commonly known as: Exforge Take 1 tablet by mouth daily. In place of amlodipine 5 mg What changed: additional instructions   aspirin EC 81 MG tablet Take 1 tablet (81 mg total) by mouth daily.   atorvastatin 20 MG tablet Commonly known as: LIPITOR Take 1 tablet (20 mg total) by mouth daily.   B-12 500 MCG Subl Place 1 tablet under the tongue daily. What changed: how much to take   clopidogrel 75 MG tablet Commonly known as: PLAVIX Take 75 mg by mouth daily.   Dialyvite Vitamin D 5000 125 MCG (5000 UT) capsule Generic drug: Cholecalciferol Take 5,000 Units by mouth daily.   thiamine 50 MG tablet Commonly known as: VITAMIN B-1 Take 1 tablet (50 mg total) by mouth daily.   Trelegy Ellipta 100-62.5-25 MCG/INH Aepb Generic drug: Fluticasone-Umeclidin-Vilant Inhale 1 puff into the lungs daily as needed (shortness of breath).      Instructions: 1.  Wash incision with soap and water  Disposition: CIR  Patient's condition: is Good  Follow up: 1. Dr. [name] in 4 weeks   Leontine Locket, PA-C [facility] [phone] 05/17/2020  8:12 AM

## 2020-05-17 ENCOUNTER — Inpatient Hospital Stay (HOSPITAL_COMMUNITY)
Admission: RE | Admit: 2020-05-17 | Discharge: 2020-05-24 | DRG: 560 | Disposition: A | Discharge status: Home Health | Payer: 59 | Source: Intra-hospital | Attending: Physical Medicine & Rehabilitation | Admitting: Physical Medicine & Rehabilitation

## 2020-05-17 ENCOUNTER — Other Ambulatory Visit: Payer: Self-pay

## 2020-05-17 ENCOUNTER — Encounter (HOSPITAL_COMMUNITY): Payer: Self-pay | Admitting: Physical Medicine & Rehabilitation

## 2020-05-17 DIAGNOSIS — Z79899 Other long term (current) drug therapy: Secondary | ICD-10-CM | POA: Diagnosis not present

## 2020-05-17 DIAGNOSIS — Z7951 Long term (current) use of inhaled steroids: Secondary | ICD-10-CM

## 2020-05-17 DIAGNOSIS — S88111A Complete traumatic amputation at level between knee and ankle, right lower leg, initial encounter: Secondary | ICD-10-CM | POA: Diagnosis not present

## 2020-05-17 DIAGNOSIS — E785 Hyperlipidemia, unspecified: Secondary | ICD-10-CM | POA: Diagnosis present

## 2020-05-17 DIAGNOSIS — I739 Peripheral vascular disease, unspecified: Secondary | ICD-10-CM | POA: Diagnosis present

## 2020-05-17 DIAGNOSIS — Z7982 Long term (current) use of aspirin: Secondary | ICD-10-CM

## 2020-05-17 DIAGNOSIS — Z87891 Personal history of nicotine dependence: Secondary | ICD-10-CM

## 2020-05-17 DIAGNOSIS — Z825 Family history of asthma and other chronic lower respiratory diseases: Secondary | ICD-10-CM

## 2020-05-17 DIAGNOSIS — N183 Chronic kidney disease, stage 3 unspecified: Secondary | ICD-10-CM | POA: Diagnosis present

## 2020-05-17 DIAGNOSIS — G8918 Other acute postprocedural pain: Secondary | ICD-10-CM | POA: Diagnosis present

## 2020-05-17 DIAGNOSIS — J449 Chronic obstructive pulmonary disease, unspecified: Secondary | ICD-10-CM | POA: Diagnosis present

## 2020-05-17 DIAGNOSIS — I10 Essential (primary) hypertension: Secondary | ICD-10-CM | POA: Diagnosis not present

## 2020-05-17 DIAGNOSIS — Z8249 Family history of ischemic heart disease and other diseases of the circulatory system: Secondary | ICD-10-CM | POA: Diagnosis not present

## 2020-05-17 DIAGNOSIS — Z7902 Long term (current) use of antithrombotics/antiplatelets: Secondary | ICD-10-CM | POA: Diagnosis not present

## 2020-05-17 DIAGNOSIS — K5900 Constipation, unspecified: Secondary | ICD-10-CM | POA: Diagnosis present

## 2020-05-17 DIAGNOSIS — I129 Hypertensive chronic kidney disease with stage 1 through stage 4 chronic kidney disease, or unspecified chronic kidney disease: Secondary | ICD-10-CM | POA: Diagnosis present

## 2020-05-17 DIAGNOSIS — T8743 Infection of amputation stump, right lower extremity: Secondary | ICD-10-CM

## 2020-05-17 DIAGNOSIS — N179 Acute kidney failure, unspecified: Secondary | ICD-10-CM

## 2020-05-17 DIAGNOSIS — Z89511 Acquired absence of right leg below knee: Secondary | ICD-10-CM

## 2020-05-17 DIAGNOSIS — G546 Phantom limb syndrome with pain: Secondary | ICD-10-CM

## 2020-05-17 DIAGNOSIS — E875 Hyperkalemia: Secondary | ICD-10-CM | POA: Diagnosis not present

## 2020-05-17 DIAGNOSIS — Z4781 Encounter for orthopedic aftercare following surgical amputation: Secondary | ICD-10-CM | POA: Diagnosis present

## 2020-05-17 DIAGNOSIS — D62 Acute posthemorrhagic anemia: Secondary | ICD-10-CM

## 2020-05-17 MED ORDER — GUAIFENESIN-DM 100-10 MG/5ML PO SYRP: 5.0000 mL | ORAL_SOLUTION | Freq: Four times a day (QID) | ORAL | Status: DC | PRN

## 2020-05-17 MED ORDER — OXYCODONE-ACETAMINOPHEN 5-325 MG PO TABS
1.0000 | ORAL_TABLET | ORAL | Status: DC | PRN
  Administered 2020-05-17 – 2020-05-24 (×35): 2 via ORAL
  Filled 2020-05-17 (×36): qty 2

## 2020-05-17 MED ORDER — ALBUTEROL SULFATE (2.5 MG/3ML) 0.083% IN NEBU: 3.0000 mL | INHALATION_SOLUTION | Freq: Four times a day (QID) | RESPIRATORY_TRACT | Status: DC | PRN

## 2020-05-17 MED ORDER — ENOXAPARIN SODIUM 40 MG/0.4ML ~~LOC~~ SOLN
40.0000 mg | SUBCUTANEOUS | Status: DC
  Administered 2020-05-17 – 2020-05-23 (×7): 40 mg via SUBCUTANEOUS
  Filled 2020-05-17 (×8): qty 0.4

## 2020-05-17 MED ORDER — POLYETHYLENE GLYCOL 3350 17 G PO PACK
17.0000 g | PACK | Freq: Every day | ORAL | Status: DC | PRN
  Administered 2020-05-18 – 2020-05-24 (×5): 17 g via ORAL
  Filled 2020-05-17 (×5): qty 1

## 2020-05-17 MED ORDER — UMECLIDINIUM BROMIDE 62.5 MCG/INH IN AEPB
1.0000 | INHALATION_SPRAY | Freq: Every day | RESPIRATORY_TRACT | Status: DC
  Administered 2020-05-18 – 2020-05-24 (×6): 1 via RESPIRATORY_TRACT
  Filled 2020-05-17: qty 7

## 2020-05-17 MED ORDER — ADULT MULTIVITAMIN W/MINERALS CH
1.0000 | ORAL_TABLET | Freq: Every day | ORAL | Status: DC
  Administered 2020-05-17 – 2020-05-24 (×8): 1 via ORAL
  Filled 2020-05-17 (×8): qty 1

## 2020-05-17 MED ORDER — TRAZODONE HCL 50 MG PO TABS
25.0000 mg | ORAL_TABLET | Freq: Every evening | ORAL | Status: DC | PRN
  Administered 2020-05-18 – 2020-05-23 (×5): 50 mg via ORAL
  Filled 2020-05-17 (×6): qty 1

## 2020-05-17 MED ORDER — PROSOURCE PLUS PO LIQD
30.0000 mL | Freq: Two times a day (BID) | ORAL | Status: DC
  Administered 2020-05-18 – 2020-05-24 (×14): 30 mL via ORAL
  Filled 2020-05-17 (×14): qty 30

## 2020-05-17 MED ORDER — ACETAMINOPHEN 325 MG PO TABS
325.0000 mg | ORAL_TABLET | ORAL | Status: DC | PRN
  Administered 2020-05-22: 650 mg via ORAL
  Filled 2020-05-17: qty 2

## 2020-05-17 MED ORDER — FLEET ENEMA 7-19 GM/118ML RE ENEM: 1.0000 | ENEMA | Freq: Once | RECTAL | Status: DC | PRN

## 2020-05-17 MED ORDER — PROCHLORPERAZINE 25 MG RE SUPP: 12.5000 mg | Freq: Four times a day (QID) | RECTAL | Status: DC | PRN

## 2020-05-17 MED ORDER — DIPHENHYDRAMINE HCL 12.5 MG/5ML PO ELIX: 12.5000 mg | ORAL_SOLUTION | Freq: Four times a day (QID) | ORAL | Status: DC | PRN

## 2020-05-17 MED ORDER — AMLODIPINE BESYLATE 5 MG PO TABS
5.0000 mg | ORAL_TABLET | Freq: Every day | ORAL | Status: DC
  Administered 2020-05-18 – 2020-05-24 (×7): 5 mg via ORAL
  Filled 2020-05-17 (×7): qty 1

## 2020-05-17 MED ORDER — METOPROLOL TARTRATE 5 MG/5ML IV SOLN
2.0000 mg | INTRAVENOUS | Status: DC | PRN
  Filled 2020-05-17: qty 5

## 2020-05-17 MED ORDER — PROCHLORPERAZINE MALEATE 5 MG PO TABS: 5.0000 mg | ORAL_TABLET | Freq: Four times a day (QID) | ORAL | Status: DC | PRN

## 2020-05-17 MED ORDER — VITAMIN B-12 1000 MCG PO TABS
500.0000 ug | ORAL_TABLET | Freq: Every day | ORAL | Status: DC
  Administered 2020-05-18 – 2020-05-24 (×7): 500 ug via ORAL
  Filled 2020-05-17 (×7): qty 1

## 2020-05-17 MED ORDER — ASCORBIC ACID 500 MG PO TABS
500.0000 mg | ORAL_TABLET | Freq: Every day | ORAL | Status: DC
  Administered 2020-05-18 – 2020-05-24 (×7): 500 mg via ORAL
  Filled 2020-05-17 (×8): qty 1

## 2020-05-17 MED ORDER — IRBESARTAN 75 MG PO TABS
150.0000 mg | ORAL_TABLET | Freq: Every day | ORAL | Status: DC
  Administered 2020-05-18 – 2020-05-24 (×7): 150 mg via ORAL
  Filled 2020-05-17 (×7): qty 2

## 2020-05-17 MED ORDER — ATORVASTATIN CALCIUM 10 MG PO TABS
20.0000 mg | ORAL_TABLET | Freq: Every day | ORAL | Status: DC
  Administered 2020-05-18 – 2020-05-24 (×7): 20 mg via ORAL
  Filled 2020-05-17 (×8): qty 2

## 2020-05-17 MED ORDER — ALUM & MAG HYDROXIDE-SIMETH 200-200-20 MG/5ML PO SUSP: 30.0000 mL | ORAL | Status: DC | PRN

## 2020-05-17 MED ORDER — ASPIRIN EC 81 MG PO TBEC
81.0000 mg | DELAYED_RELEASE_TABLET | Freq: Every day | ORAL | Status: DC
  Administered 2020-05-18 – 2020-05-24 (×7): 81 mg via ORAL
  Filled 2020-05-17 (×7): qty 1

## 2020-05-17 MED ORDER — VITAMIN D 25 MCG (1000 UNIT) PO TABS
5000.0000 [IU] | ORAL_TABLET | Freq: Every day | ORAL | Status: DC
  Administered 2020-05-18 – 2020-05-24 (×7): 5000 [IU] via ORAL
  Filled 2020-05-17 (×7): qty 5

## 2020-05-17 MED ORDER — BISACODYL 10 MG RE SUPP: 10.0000 mg | Freq: Every day | RECTAL | Status: DC | PRN

## 2020-05-17 MED ORDER — DOCUSATE SODIUM 100 MG PO CAPS
100.0000 mg | ORAL_CAPSULE | Freq: Every day | ORAL | Status: DC
  Administered 2020-05-18 – 2020-05-24 (×8): 100 mg via ORAL
  Filled 2020-05-17 (×7): qty 1

## 2020-05-17 MED ORDER — PROCHLORPERAZINE EDISYLATE 10 MG/2ML IJ SOLN: 5.0000 mg | Freq: Four times a day (QID) | INTRAMUSCULAR | Status: DC | PRN

## 2020-05-17 MED ORDER — FLUTICASONE FUROATE-VILANTEROL 100-25 MCG/INH IN AEPB
1.0000 | INHALATION_SPRAY | Freq: Every day | RESPIRATORY_TRACT | Status: DC
  Administered 2020-05-18 – 2020-05-24 (×6): 1 via RESPIRATORY_TRACT
  Filled 2020-05-17: qty 28

## 2020-05-17 MED ORDER — PHENOL 1.4 % MT LIQD
1.0000 | OROMUCOSAL | Status: DC | PRN
  Filled 2020-05-17: qty 177

## 2020-05-17 MED ORDER — PANTOPRAZOLE SODIUM 40 MG PO TBEC
40.0000 mg | DELAYED_RELEASE_TABLET | Freq: Every day | ORAL | Status: DC
  Administered 2020-05-18 – 2020-05-24 (×7): 40 mg via ORAL
  Filled 2020-05-17 (×7): qty 1

## 2020-05-17 NOTE — Plan of Care (Signed)
  Problem: Education: Goal: Knowledge of General Education information will improve Description: Including pain rating scale, medication(s)/side effects and non-pharmacologic comfort measures Outcome: Progressing   Problem: Health Behavior/Discharge Planning: Goal: Ability to manage health-related needs will improve Outcome: Progressing   Problem: Activity: Goal: Risk for activity intolerance will decrease Outcome: Progressing   Problem: Nutrition: Goal: Adequate nutrition will be maintained Outcome: Progressing   Problem: Elimination: Goal: Will not experience complications related to bowel motility Outcome: Progressing   Problem: Pain Managment: Goal: General experience of comfort will improve Outcome: Progressing   Problem: Safety: Goal: Ability to remain free from injury will improve Outcome: Progressing   

## 2020-05-17 NOTE — H&P (Signed)
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  He was admitted on 05/09/20 for R-BKA by Dr. [name]. Post op with ABLA which was being monitored. Acute on chronic renal failure was resolving and he continues to have oozing from incision line. Therapy ongoing and CIR recommended due to functional decline in mobility and ADLs.    Review of Systems  Constitutional: Negative.   HENT: Negative.   Eyes: Negative.   Respiratory: Negative.   Cardiovascular: Negative.   Gastrointestinal: Negative.   Genitourinary: Negative.   Musculoskeletal: Negative.   Skin: Negative.   Neurological: Positive for focal weakness.  Endo/Heme/Allergies: Negative.   Psychiatric/Behavioral: Positive for depression.      Past Medical History:  Diagnosis Date  . CKD (chronic kidney disease)   . History of blood transfusion   . Hyperlipidemia   . Hypertension   . Neuromuscular disorder (HCC)    numbness feet  . Peripheral vascular disease ([place])   . Shortness of breath dyspnea    occasional     Past Surgical History:  Procedure Laterality Date  . ABDOMINAL AORTOGRAM W/LOWER EXTREMITY N/A 09/13/2019   Procedure: ABDOMINAL AORTOGRAM W/LOWER EXTREMITY;  Surgeon: Waynetta Sandy, MD;  Location: [place] [facility];  Service: Cardiovascular;  Laterality: N/A;  . AMPUTATION Right 05/09/2020   Procedure: RIGHT BELOW KNEE AMPUTATION;  Surgeon: Waynetta Sandy, MD;  Location: [place];  Service: Vascular;  Laterality: Right;  w/ a block  . COLONOSCOPY WITH PROPOFOL N/A 02/15/2016   Procedure: COLONOSCOPY WITH PROPOFOL;  Surgeon: Lucilla Lame, MD;  Location: [place];  Service: Endoscopy;  Laterality: N/A;   . FEMORAL-POPLITEAL BYPASS GRAFT Right 09/14/2019   Procedure: BYPASS GRAFT FEMORAL-POPLITEAL ARTERY;  Surgeon: Waynetta Sandy, MD;  Location: [place];  Service: Vascular;  Laterality: Right;  . HERNIA REPAIR  1999   left inguinal  . INSERTION OF ILIAC STENT Right 09/14/2019   Procedure: Insertion Of Common and External Iliac Stent;  Surgeon: Waynetta Sandy, MD;  Location: [place];  Service: Vascular;  Laterality: Right;  . LOWER EXTREMITY ANGIOGRAPHY Right 05/07/2018   Procedure: LOWER EXTREMITY ANGIOGRAPHY;  Surgeon: Algernon Huxley, MD;  Location: [place] [facility];  Service: Cardiovascular;  Laterality: Right;  . LOWER EXTREMITY ANGIOGRAPHY Right 06/25/2018   Procedure: LOWER EXTREMITY ANGIOGRAPHY;  Surgeon: Algernon Huxley, MD;  Location: [place] [facility];  Service: Cardiovascular;  Laterality: Right;  . LOWER EXTREMITY ANGIOGRAPHY Right 07/01/2018   Procedure: LOWER EXTREMITY ANGIOGRAPHY;  Surgeon: Algernon Huxley, MD;  Location: [place] [facility];  Service: Cardiovascular;  Laterality: Right;  . LOWER EXTREMITY ANGIOGRAPHY Right 08/12/2018   Procedure: LOWER EXTREMITY ANGIOGRAPHY;  Surgeon: Algernon Huxley, MD;  Location: [place] [facility];  Service: Cardiovascular;  Laterality: Right;  . LOWER EXTREMITY ANGIOGRAPHY Right 09/03/2018   Procedure: LOWER EXTREMITY ANGIOGRAPHY;  Surgeon: Algernon Huxley, MD;  Location: [place] [facility];  Service: Cardiovascular;  Laterality: Right;  . LOWER EXTREMITY ANGIOGRAPHY Right 09/04/2018   Procedure: Lower Extremity Angiography;  Surgeon: Algernon Huxley, MD;  Location: [place] [facility];  Service: Cardiovascular;  Laterality: Right;  . LOWER EXTREMITY ANGIOGRAPHY Right 10/01/2018   Procedure:  LOWER EXTREMITY ANGIOGRAPHY;  Surgeon: Algernon Huxley, MD;  Location: [place] [facility];  Service: Cardiovascular;  Laterality: Right;  . LOWER EXTREMITY ANGIOGRAPHY Right 10/01/2018   Procedure: Lower Extremity Angiography;  Surgeon: Algernon Huxley, MD;   Location: [place] [facility];  Service: Cardiovascular;  Laterality: Right;  . LOWER EXTREMITY ANGIOGRAPHY Right 10/15/2018   Procedure: LOWER EXTREMITY ANGIOGRAPHY;  Surgeon: Algernon Huxley, MD;  Location: [place] [facility];  Service: Cardiovascular;  Laterality: Right;  . LOWER EXTREMITY ANGIOGRAPHY Left 10/16/2018   Procedure: Lower Extremity Angiography;  Surgeon: Algernon Huxley, MD;  Location: [place] [facility];  Service: Cardiovascular;  Laterality: Left;  . LOWER EXTREMITY ANGIOGRAPHY Left 01/20/2019   Procedure: LOWER EXTREMITY ANGIOGRAPHY;  Surgeon: Algernon Huxley, MD;  Location: [place] [facility];  Service: Cardiovascular;  Laterality: Left;  . LOWER EXTREMITY ANGIOGRAPHY Right 01/21/2019   Procedure: Lower Extremity Angiography;  Surgeon: Algernon Huxley, MD;  Location: [place] [facility];  Service: Cardiovascular;  Laterality: Right;  . LOWER EXTREMITY ANGIOGRAPHY Right 01/28/2019   Procedure: LOWER EXTREMITY ANGIOGRAPHY;  Surgeon: Algernon Huxley, MD;  Location: [place] [facility];  Service: Cardiovascular;  Laterality: Right;  . LOWER EXTREMITY ANGIOGRAPHY Right 01/29/2019   Procedure: Lower Extremity Angiography;  Surgeon: Algernon Huxley, MD;  Location: [place] [facility];  Service: Cardiovascular;  Laterality: Right;  . LOWER EXTREMITY ANGIOGRAPHY Right 04/04/2020   Procedure: LOWER EXTREMITY ANGIOGRAPHY;  Surgeon: Waynetta Sandy, MD;  Location: [place] [facility];  Service: Cardiovascular;  Laterality: Right;  . LOWER EXTREMITY INTERVENTION Right 07/02/2018   Procedure: LOWER EXTREMITY INTERVENTION;  Surgeon: Algernon Huxley, MD;  Location: [place] [facility];  Service: Cardiovascular;  Laterality: Right;  . PERIPHERAL VASCULAR BALLOON ANGIOPLASTY Left 04/04/2020   Procedure: PERIPHERAL VASCULAR BALLOON ANGIOPLASTY;  Surgeon: Waynetta Sandy, MD;  Location: [place] [facility];  Service: Cardiovascular;  Laterality: Left;  external iliac  . POLYPECTOMY N/A 02/15/2016    Procedure: POLYPECTOMY;  Surgeon: Lucilla Lame, MD;  Location: [place];  Service: Endoscopy;  Laterality: N/A;    Family History  Problem Relation Age of Onset  . COPD Mother   . Hypertension Father   . Heart attack Brother     Social History:  reports that he quit smoking about 16 months ago. His smoking use included cigarettes. He started smoking about 41 years ago. He has a 20.00 pack-year smoking history. He has never used smokeless tobacco. He reports current alcohol use of about 12.0 standard drinks of alcohol per week. He reports that he does not use drugs.    Allergies: No Known Allergies    Medications Prior to Admission  Medication Sig Dispense Refill  . acetaminophen (TYLENOL) 500 MG tablet Take 1,000 mg by mouth every 6 (six) hours as needed for moderate pain or headache.    . albuterol (VENTOLIN HFA) 108 (90 Base) MCG/ACT inhaler Inhale 2 puffs into the lungs every 6 (six) hours as needed for wheezing or shortness of breath. 18 g 0  . amLODipine-valsartan (EXFORGE) 5-160 MG tablet Take 1 tablet by mouth daily. In place of amlodipine 5 mg (Patient taking differently: Take 1 tablet by mouth daily. ) 90 tablet 0  . aspirin EC 81 MG tablet Take 1 tablet (81 mg total) by mouth daily. 90 tablet 3  . atorvastatin (LIPITOR) 20 MG tablet Take 1 tablet (20 mg total) by mouth daily. 90 tablet 1  . Cholecalciferol (  DIALYVITE VITAMIN D 5000) 125 MCG (5000 UT) capsule Take 5,000 Units by mouth daily.    . clopidogrel (PLAVIX) 75 MG tablet Take 75 mg by mouth daily. (Patient not taking: Reported on 05/05/2020)    . Cyanocobalamin (B-12) 500 MCG SUBL Place 1 tablet under the tongue daily. (Patient taking differently: Place 500 mcg under the tongue daily. ) 30 tablet 0  . Fluticasone-Umeclidin-Vilant (TRELEGY ELLIPTA) 100-62.5-25 MCG/INH AEPB Inhale 1 puff into the lungs daily as needed (shortness of breath). 60 each 5  . thiamine (VITAMIN B-1) 50 MG tablet Take 1 tablet (50 mg  total) by mouth daily. (Patient not taking: Reported on 05/05/2020) 30 tablet 2    Drug Regimen Review  Drug regimen was reviewed and remains appropriate with no significant issues identified  Home: Home Living Family/patient expects to be discharged to:: Private residence Living Arrangements:  (lives with daughter and her wife) Available Help at Discharge: Family, Available PRN/intermittently Type of Home: House Home Access: Stairs to enter Technical brewer of Steps: 1 Home Layout: Two level, Able to live on main level with bedroom/bathroom Alternate Level Stairs-Number of Steps: 12 Bathroom Shower/Tub: Chiropodist: Standard Bathroom Accessibility: Yes Home Equipment: Kasandra Knudsen - quad, Environmental consultant - 2 wheels, Wheelchair - manual Additional Comments: plans to get a tub bench  Lives With: Daughter   Functional History: Prior Function Level of Independence: Independent Comments: Reports independent IADLs, ADLS, driving, short community distances  Functional Status:  Mobility: Bed Mobility Overal bed mobility: Needs Assistance Bed Mobility: Supine to Sit, Sit to Supine Rolling: Supervision, Min guard Supine to sit: Supervision, HOB elevated Sit to supine: Supervision General bed mobility comments: use of bed features, increased time to initiate Transfers Overall transfer level: Needs assistance Equipment used: Rolling walker (2 wheeled) Transfers: Sit to/from Stand Sit to Stand: Min assist General transfer comment: pt limited by pain when standing at RW, able to remain standing ~2 minutes to perform RLE therex but refusing further stands/transfer to bed or w/c due to severe pain Ambulation/Gait Ambulation/Gait assistance:  (pt defers this session 2/2 pain) Gait Distance (Feet): 2 Feet Assistive device: Rolling walker (2 wheeled) Gait Pattern/deviations: Step-to pattern General Gait Details: Pt able to minimally hop on LLE with RW, difficulty clearing  foot, limited by pain/fatigue weakness, minA to correct instability; pt declining further ambulation distance secondary to "I overdid it last time" - therefore opted to focus on transfer training  ADL: ADL Overall ADL's : Needs assistance/impaired Eating/Feeding: Independent, Sitting Grooming: Minimal assistance, Standing, Oral care Grooming Details (indicate cue type and reason): Min A for oral care standing at sink to ensure safety and balance, assistance for setting up task due to difficulty maintaining balance with bimanual tasks Upper Body Bathing: Set up, Sitting Lower Body Bathing: Moderate assistance, Sitting/lateral leans, Sit to/from stand Upper Body Dressing : Set up, Sitting Lower Body Dressing: Minimal assistance, Sitting/lateral leans Lower Body Dressing Details (indicate cue type and reason): Collaborated with pt on LB dressing techniques. Using lateral leans, pt able to demo ability to don/doff scrub pants without assistance. Pt reports previously using lateral leans due to pain prior to surgery. Pt will need increased assist for LB ADLs in standing q Toilet Transfer: Min guard, Anterior/posterior, BSC Toilet Transfer Details (indicate cue type and reason): min guard for AP transfer to [facility]. OT assisting in stabilizing [place] Manipulation and Hygiene: Moderate assistance, Sitting/lateral lean, Sit to/from stand Functional mobility during ADLs: Minimal assistance, Rolling walker, Cueing for sequencing,  Cueing for safety General ADL Comments: Pt limited by pain today, reports familiar with lateral leans for ADLs prior to surgery.   Cognition: Cognition Overall Cognitive Status: Within Functional Limits for tasks assessed Orientation Level: Oriented X4 Cognition Arousal/Alertness: Awake/alert Behavior During Therapy: WFL for tasks assessed/performed Overall Cognitive Status: Within Functional Limits for tasks assessed General Comments: pleasantly  cooperative  Physical Exam: Blood pressure 123/61, pulse 65, temperature 98.4 F (36.9 C), resp. rate 18, height 5\' 8"  (1.727 m), weight 61.7 kg, SpO2 98 %. Physical Exam  General: Alert and oriented x 3, No apparent distress HEENT: Head is normocephalic, atraumatic, PERRLA, EOMI, sclera anicteric, oral mucosa pink and moist, dentition intact, ext ear canals clear,  Neck: Supple without JVD or lymphadenopathy Heart: Reg rate and rhythm. No murmurs rubs or gallops Chest: CTA bilaterally without wheezes, rales, or rhonchi; no distress Abdomen: Soft, non-tender, non-distended, bowel sounds positive. Extremities: No clubbing, cyanosis, or edema. Pulses are 2+ Skin: R BKA incision healing well, some oozing along staples. Neuro: Pt is cognitively appropriate with normal insight, memory, and awareness. Cranial nerves 2-12 are intact. Sensory exam is normal. Reflexes are 2+ in all 4's. Fine motor coordination is intact. No tremors. Motor function is grossly 5/5.  Psych: Pt's affect is appropriate. Pt is cooperative  No results found for this or any previous visit (from the past 48 hour(s)). No results found.  Medical Problem List and Plan: 1.  Impaired mobility and ADLs secondary to right BKA  -patient may not shower  -ELOS/Goals: modi 10-14 days.  2.  Antithrombotics: -DVT/anticoagulation:  Pharmaceutical: Lovenox  -antiplatelet therapy: on ASA 3. Pain Management: has been using oxycodone 10 mg every 4 hours.  4. Mood: LCSW to follow for evaluation and support.   -antipsychotic agents: N/a 5. Neuropsych: This patient is capable of making decisions on his own behalf. 6. Skin/Wound Care: Monitor wound drainage/healing. Will add multivitamin, Vitamin C and protein supplement to promote healing.  7. Fluids/Electrolytes/Nutrition: Monitor I/O. Discontinue IVF. Check BMET in am.  8. ABLA: Monitor with serial check. Hgb is 10.2 on 11/21. Will order CBC for am.  9. Acute on chronic renal failure  (CKD3): Has improved --1.54-->1.76-->1.31. 10. HTN: Monitor BP tid--continue Avapro and amlodipine-valsartan 5-160mg . Has been soft to normotensive.  11. COPD: Respiratory status stable on Breo and Incruse. Currently satting well on room air.   Reesa Chew, PA-C  I have personally performed a face to face diagnostic evaluation, including, but not limited to relevant history and physical exam findings, of this patient and developed relevant assessment and plan.  Additionally, I have reviewed and concur with the physician assistant's documentation above.  Leeroy Cha, MD

## 2020-05-17 NOTE — Progress Notes (Addendum)
  Progress Note    05/17/2020 7:53 AM 8 Days Post-Op  Subjective:  No complaints   Vitals:   05/16/20 1936 05/17/20 0358  BP: 123/76 132/90  Pulse: 82 81  Resp: 16 14  Temp: 98 F (36.7 C) 98 F (36.7 C)  SpO2: 96% 100%    Physical Exam: Incisions:  R BKA incision c/d/i   CBC    Component Value Date/Time   WBC 7.8 05/14/2020 0603   RBC 3.29 (L) 05/14/2020 0603   HGB 10.2 (L) 05/14/2020 0603   HGB 13.1 08/31/2015 0929   HCT 30.8 (L) 05/14/2020 0603   HCT 38.5 08/31/2015 0929   PLT 436 (H) 05/14/2020 0603   PLT 217 08/31/2015 0929   MCV 93.6 05/14/2020 0603   MCV 93 08/31/2015 0929   MCH 31.0 05/14/2020 0603   MCHC 33.1 05/14/2020 0603   RDW 11.7 05/14/2020 0603   RDW 12.8 08/31/2015 0929   LYMPHSABS 1,076 03/14/2020 0919   LYMPHSABS 1.5 08/31/2015 0929   EOSABS 180 03/14/2020 0919   EOSABS 0.1 08/31/2015 0929   BASOSABS 42 03/14/2020 0919   BASOSABS 0.0 08/31/2015 0929    BMET    Component Value Date/Time   NA 136 05/14/2020 0603   NA 137 08/31/2015 0929   K 4.5 05/14/2020 0603   CL 102 05/14/2020 0603   CO2 25 05/14/2020 0603   GLUCOSE 87 05/14/2020 0603   BUN 19 05/14/2020 0603   BUN 11 08/31/2015 0929   CREATININE 1.31 (H) 05/14/2020 0603   CREATININE 1.51 (H) 03/14/2020 0919   CALCIUM 9.2 05/14/2020 0603   GFRNONAA >60 05/14/2020 0603   GFRNONAA 49 (L) 03/14/2020 0919   GFRAA 57 (L) 03/14/2020 0919    INR    Component Value Date/Time   INR 1.2 05/09/2020 0709     Intake/Output Summary (Last 24 hours) at 05/17/2020 0753 Last data filed at 05/16/2020 2200 Gross per 24 hour  Intake --  Output 900 ml  Net -900 ml     Assessment/Plan:  64 y.o. male is s/p right below knee amputation  8 Days Post-Op  -R BKA incision healing well -ready for d/c to CIR when bed available  Dagoberto Ligas, PA-C [facility] [phone] 05/17/2020 7:53 AM   I have seen and evaluated the patient. I agree with the PA note as  documented above.  Plan CIR today.  Right BKA looks good.  I did replace the dressing given he had some oozing along the staple line.  Marty Heck, MD [facility] [place] Office: [phone]

## 2020-05-17 NOTE — Discharge Instructions (Signed)
Incision Care, Adult An incision is a surgical cut that is made through your skin. Most incisions are closed after surgery. Your incision may be closed with stitches (sutures), staples, skin glue, or adhesive strips. You may need to return to your health care provider to have sutures or staples removed. This may occur several days to several weeks after your surgery. The incision needs to be cared for properly to prevent infection. How to care for your incision Incision care   Follow instructions from your health care provider about how to take care of your incision. Make sure you: ? Wash your hands with soap and water before you change the bandage (dressing). If soap and water are not available, use hand sanitizer. ? Change your dressing as told by your health care provider. ? Leave sutures, skin glue, or adhesive strips in place. These skin closures may need to stay in place for 2 weeks or longer. If adhesive strip edges start to loosen and curl up, you may trim the loose edges. Do not remove adhesive strips completely unless your health care provider tells you to do that.  Check your incision area every day for signs of infection. Check for: ? More redness, swelling, or pain. ? More fluid or blood. ? Warmth. ? Pus or a bad smell.  Ask your health care provider how to clean the incision. This may include: ? Using mild soap and water. ? Using a clean towel to pat the incision dry after cleaning it. ? Applying a cream or ointment. Do this only as told by your health care provider. ? Covering the incision with a clean dressing.  Ask your health care provider when you can leave the incision uncovered.  Do not take baths, swim, or use a hot tub until your health care provider approves. Ask your health care provider if you can take showers. You may only be allowed to take sponge baths for bathing. Medicines  If you were prescribed an antibiotic medicine, cream, or ointment, take or apply the  antibiotic as told by your health care provider. Do not stop taking or applying the antibiotic even if your condition improves.  Take over-the-counter and prescription medicines only as told by your health care provider. General instructions  Limit movement around your incision to improve healing. ? Avoid straining, lifting, or exercise for the first month, or for as long as told by your health care provider. ? Follow instructions from your health care provider about returning to your normal activities. ? Ask your health care provider what activities are safe.  Protect your incision from the sun when you are outside for the first 6 months, or for as long as told by your health care provider. Apply sunscreen around the scar or cover it up.  Keep all follow-up visits as told by your health care provider. This is important. Contact a health care provider if:  Your have more redness, swelling, or pain around the incision.  You have more fluid or blood coming from the incision.  Your incision feels warm to the touch.  You have pus or a bad smell coming from the incision.  You have a fever or shaking chills.  You are nauseous or you vomit.  You are dizzy.  Your sutures or staples come undone. Get help right away if:  You have a red streak coming from your incision.  Your incision bleeds through the dressing and the bleeding does not stop with gentle pressure.  The edges of   your incision open up and separate.  You have severe pain.  You have a rash.  You are confused.  You faint.  You have trouble breathing and a fast heartbeat. This information is not intended to replace advice given to you by your health care provider. Make sure you discuss any questions you have with your health care provider. Document Revised: 06/12/2018 Document Reviewed: 12/27/2015 Elsevier Patient Education  2020 [employer]   

## 2020-05-17 NOTE — Progress Notes (Signed)
Inpatient Rehabilitation Admissions Coordinator  I have received insurance approval for Cir admit and bed available today. I will make the arrangements to admit, notify patient as well as acute team and TOC.  Danne Baxter, RN, MSN Rehab Admissions Coordinator [phone] 05/17/2020 8:03 AM

## 2020-05-17 NOTE — Progress Notes (Signed)
Izora Ribas, MD  Physician  Physical Medicine and Rehabilitation  PMR Pre-admission     Signed  Date of Service:  05/10/2020  2:22 PM      Related encounter: Admission (Current) from 05/09/2020 in [place]       Show:Clear all [x] Manual[x] Template[] Copied  Added by: [x] Cristina Gong, RN[x] Ranell Patrick Clide Deutscher, MD  [] Hover for details PMR Admission Coordinator Pre-Admission Assessment   Patient: Ryan [name] is an 64 y.o., male MRN: [id number] DOB: 03-15-1956 Height: 5' 8"  (172.7 cm) Weight: 66.7 kg   Insurance Information HMO:     PPO:      PCP:      IPA:      80/20:      OTHER:  PRIMARY: [facility]      Policy#: [id number]      Subscriber: pt CM Name: via fax     Phone#: [phone]   Fax#: [phone] Pre-Cert#: [id number] approved for 10 days     Employer:  Benefits:  Phone #: online     Name: 11/17 Eff. Date: 08/23/2019     Deduct: none      Out of Pocket Max: $1500 CIR: 80%      SNF: 80%V 60 days Outpatient: 80%     Co-Pay: 30 visits combined Home Health: 80%      Co-Pay: 205 DME: 80%     Co-Pay: 20% Providers: in network  SECONDARY: none      Policy#:      Phone#:    Development worker, community:       Phone#:    The Engineer, petroleum" for patients in Inpatient Rehabilitation Facilities with attached "Privacy Act [place] Records" was provided and verbally reviewed with: N/A   Emergency Contact Information         Contact Information     Name Relation Home Work Mobile    [place] Daughter [phone]   [phone]    keen, ewalt Daughter     [phone]    Raylene Miyamoto Daughter     [phone]         Current Medical History  Patient Admitting Diagnosis: BKA   History of Present Illness: 64 year old male with medical history of extensive stenting of his right lower extremity, a right anterior tibial artery bypass with composite graft and  vein and common femoral endarterectomy with retrograde stenting of his external iliac artery. This has subsequently occluded. Wound on his right leg is now necrotic. Presented on 05/09/2020 with severe pain in his right lower extremity. Right BKA performed by Dr. [name] on 05/09/2020.   Postoperatively with good pain control and monitoring for anemia.    Patient's medical record from [facility]  has been reviewed by the rehabilitation admission coordinator and physician.   Past Medical History      Past Medical History:  Diagnosis Date  . CKD (chronic kidney disease)    . History of blood transfusion    . Hyperlipidemia    . Hypertension    . Neuromuscular disorder (HCC)      numbness feet  . Peripheral vascular disease ([place])    . Shortness of breath dyspnea      occasional       Family History   family history includes COPD in his mother; Heart attack in his brother; Hypertension in his father.   Prior Rehab/Hospitalizations Has the patient had prior rehab or hospitalizations  prior to admission? Yes   Has the patient had major surgery during 100 days prior to admission? Yes              Current Medications   Current Facility-Administered Medications:  .  0.9 %  sodium chloride infusion, , Intravenous, Continuous, Orbie Hurst, Last Rate: 75 mL/hr at 05/09/20 1228, New Bag at 05/09/20 1228 .  acetaminophen (TYLENOL) tablet 325-650 mg, 325-650 mg, Oral, Q4H PRN, 650 mg at 05/14/20 1056 **OR** acetaminophen (TYLENOL) suppository 325-650 mg, 325-650 mg, Rectal, Q4H PRN, Theda Sers, Emma M, PA-C .  albuterol (PROVENTIL) (2.5 MG/3ML) 0.083% nebulizer solution 3 mL, 3 mL, Inhalation, Q6H PRN, Theda Sers, Emma M, PA-C .  alum & mag hydroxide-simeth (MAALOX/MYLANTA) 200-200-20 MG/5ML suspension 15-30 mL, 15-30 mL, Oral, Q2H PRN, Laurence Slate M, PA-C, 30 mL at 05/15/20 1843 .  amLODipine (NORVASC) tablet 5 mg, 5 mg, Oral, Daily, Waynetta Sandy, MD, 5 mg at 05/16/20  0820 .  aspirin EC tablet 81 mg, 81 mg, Oral, Daily, Laurence Slate M, PA-C, 81 mg at 05/16/20 0818 .  atorvastatin (LIPITOR) tablet 20 mg, 20 mg, Oral, Daily, Laurence Slate M, PA-C, 20 mg at 05/16/20 7416 .  bisacodyl (DULCOLAX) EC tablet 5 mg, 5 mg, Oral, Daily PRN, Ulyses Amor, PA-C, 5 mg at 05/16/20 1156 .  cholecalciferol (VITAMIN D3) tablet 5,000 Units, 5,000 Units, Oral, Daily, Ulyses Amor, PA-C, 5,000 Units at 05/16/20 0818 .  docusate sodium (COLACE) capsule 100 mg, 100 mg, Oral, Daily, Laurence Slate M, PA-C, 100 mg at 05/16/20 3845 .  fluticasone furoate-vilanterol (BREO ELLIPTA) 100-25 MCG/INH 1 puff, 1 puff, Inhalation, Daily, Waynetta Sandy, MD, 1 puff at 05/17/20 0759 .  guaiFENesin-dextromethorphan (ROBITUSSIN DM) 100-10 MG/5ML syrup 15 mL, 15 mL, Oral, Q4H PRN, Theda Sers, Emma M, PA-C .  heparin injection 5,000 Units, 5,000 Units, Subcutaneous, Q8H, Collins, Emma M, [place], 5,000 Units at 05/17/20 0547 .  hydrALAZINE (APRESOLINE) injection 5 mg, 5 mg, Intravenous, Q20 Min PRN, Theda Sers, Emma M, PA-C .  HYDROmorphone (DILAUDID) injection 0.5-1 mg, 0.5-1 mg, Intravenous, Q2H PRN, Laurence Slate M, PA-C, 0.5 mg at 05/12/20 1051 .  irbesartan (AVAPRO) tablet 150 mg, 150 mg, Oral, Daily, Waynetta Sandy, MD, 150 mg at 05/16/20 0818 .  labetalol (NORMODYNE) injection 10 mg, 10 mg, Intravenous, Q10 min PRN, Theda Sers, Emma M, PA-C .  magnesium sulfate IVPB 2 g 50 mL, 2 g, Intravenous, Daily PRN, Theda Sers, Emma M, PA-C .  metoprolol tartrate (LOPRESSOR) injection 2-5 mg, 2-5 mg, Intravenous, Q2H PRN, Theda Sers, Emma M, PA-C .  ondansetron [facility]) injection 4 mg, 4 mg, Intravenous, Q6H PRN, Theda Sers, Emma M, PA-C .  oxyCODONE-acetaminophen (PERCOCET/ROXICET) 5-325 MG per tablet 1-2 tablet, 1-2 tablet, Oral, Q4H PRN, Ulyses Amor, PA-C, 2 tablet at 05/17/20 0547 .  pantoprazole (PROTONIX) EC tablet 40 mg, 40 mg, Oral, Daily, Laurence Slate M, PA-C, 40 mg at 05/16/20 3646 .   phenol (CHLORASEPTIC) mouth spray 1 spray, 1 spray, Mouth/Throat, PRN, Theda Sers, Emma M, PA-C .  potassium chloride SA (KLOR-CON) CR tablet 20-40 mEq, 20-40 mEq, Oral, Daily PRN, Theda Sers, Emma M, PA-C .  senna-docusate (Senokot-S) tablet 1 tablet, 1 tablet, Oral, QHS PRN, Theda Sers, Emma M, PA-C .  sodium phosphate (FLEET) 7-19 GM/118ML enema 1 enema, 1 enema, Rectal, Once PRN, Theda Sers, Emma M, PA-C .  umeclidinium bromide (INCRUSE ELLIPTA) 62.5 MCG/INH 1 puff, 1 puff, Inhalation, Daily, Waynetta Sandy, MD, 1 puff at 05/17/20 0759 .  vitamin B-12 (  CYANOCOBALAMIN) tablet 500 mcg, 500 mcg, Oral, Daily, Laurence Slate M, PA-C, 500 mcg at 05/16/20 9323 .  zolpidem (AMBIEN) tablet 5 mg, 5 mg, Oral, QHS PRN, Angelia Mould, MD, 5 mg at 05/17/20 0031   Patients Current Diet:     Diet Order                      Diet Heart Room service appropriate? Yes; Fluid consistency: Thin  Diet effective now                      Precautions / Restrictions Precautions Precautions: Fall Precaution Comments: Drainage at R residual limb surgical wound Restrictions Weight Bearing Restrictions: Yes RLE Weight Bearing: Non weight bearing    Has the patient had 2 or more falls or a fall with injury in the past year? No   Prior Activity Level Community (5-7x/wk): Mod I and driving   Prior Functional Level Self Care: Did the patient need help bathing, dressing, using the toilet or eating? Independent   Indoor Mobility: Did the patient need assistance with walking from room to room (with or without device)? Independent   Stairs: Did the patient need assistance with internal or external stairs (with or without device)? Independent   Functional Cognition: Did the patient need help planning regular tasks such as shopping or remembering to take medications? Independent   Home Assistive Devices / Equipment Home Assistive Devices/Equipment: Dentures (specify type), Wheelchair Home Equipment:  Cane - quad, Environmental consultant - 2 wheels, Wheelchair - manual   Prior Device Use: Indicate devices/aids used by the patient prior to current illness, exacerbation or injury? None of the above   Current Functional Level Cognition   Overall Cognitive Status: Within Functional Limits for tasks assessed Orientation Level: Oriented X4 General Comments: pleasantly cooperative    Extremity Assessment (includes Sensation/Coordination)   Upper Extremity Assessment: Overall WFL for tasks assessed  Lower Extremity Assessment: Defer to PT evaluation RLE Deficits / Details: POD#1 post BKA; Able to extend knee to just shy of full extension; Phantom sensation present (itching); Educated in desensitization     ADLs   Overall ADL's : Needs assistance/impaired Eating/Feeding: Independent, Sitting Grooming: Minimal assistance, Standing, Oral care Grooming Details (indicate cue type and reason): Min A for oral care standing at sink to ensure safety and balance, assistance for setting up task due to difficulty maintaining balance with bimanual tasks Upper Body Bathing: Set up, Sitting Lower Body Bathing: Moderate assistance, Sitting/lateral leans, Sit to/from stand Upper Body Dressing : Set up, Sitting Lower Body Dressing: Minimal assistance, Sitting/lateral leans Lower Body Dressing Details (indicate cue type and reason): Collaborated with pt on LB dressing techniques. Using lateral leans, pt able to demo ability to don/doff scrub pants without assistance. Pt reports previously using lateral leans due to pain prior to surgery. Pt will need increased assist for LB ADLs in standing q Toilet Transfer: Min guard, Anterior/posterior, BSC Toilet Transfer Details (indicate cue type and reason): min guard for AP transfer to [facility]. OT assisting in stabilizing BSC  Toileting- Clothing Manipulation and Hygiene: Minimal assistance, Sitting/lateral lean Toileting - Clothing Manipulation Details (indicate cue type and reason): Min A  for clothing mgmt. Pt able to use lateral leans for posterior hygiene Functional mobility during ADLs: Minimal assistance, Rolling walker, Cueing for sequencing, Cueing for safety General ADL Comments: Pt with increased pain when residual limb off of bed or when standing. Continues to be motivated to improve despite pain  Mobility   Overal bed mobility: Modified Independent Bed Mobility: Supine to Sit Rolling: Supervision, Min guard Supine to sit: Modified independent (Device/Increase time), HOB elevated Sit to supine: Supervision General bed mobility comments: pt up in chair pre/post session     Transfers   Overall transfer level: Needs assistance Equipment used: Rolling walker (2 wheeled) Transfers: Sit to/from Stand, Risk manager, Radiographer, therapeutic Sit to Stand: Min guard Stand pivot transfers: Min guard Anterior-Posterior transfers: Min guard General transfer comment: Pt performed sit<>stand from recliner<>wheelchair with min guard, no LOB     Ambulation / Gait / Stairs / Wheelchair Mobility   Ambulation/Gait Ambulation/Gait assistance: Herbalist (Feet): 12 Feet Assistive device: Rolling walker (2 wheeled) Gait Pattern/deviations: Step-to pattern General Gait Details: improved L foot clearance this session, small hops, cues for press and glide technique with RW rather than fatiguing up-and-down hops Gait velocity: grossly decreased Wheelchair Mobility Wheelchair mobility: Yes Wheelchair propulsion: Both upper extremities Wheelchair parts: Supervision/cueing Distance: 300 Wheelchair Assistance Details (indicate cue type and reason): vcs for use of brakes pre/post transfers as well as improved technique to propel and perform pivot turns, pt with good information carryover and min cues for technique/energy conservation provided throughout     Posture / Balance Balance Overall balance assessment: Needs assistance Sitting-balance support: No  upper extremity supported, Feet supported Sitting balance-Leahy Scale: Fair Standing balance support: Bilateral upper extremity supported, During functional activity Standing balance-Leahy Scale: Poor Standing balance comment: reliant on BUE support and external support during dynamic standing tasks     Special needs/care consideration Surgical wound    Previous Home Environment  Living Arrangements:  (lives with daughter and her wife)  Lives With: Daughter Available Help at Discharge: Family, Available PRN/intermittently Type of Home: House Home Layout: Two level, Able to live on main level with bedroom/bathroom Alternate Level Stairs-Number of Steps: 12 Home Access: Stairs to enter [employer] of Steps: 1 Bathroom Shower/Tub: Optometrist: Yes How Accessible: Accessible via walker Home Care Services: No Additional Comments: plans to get a tub bench   Discharge Living Setting Plans for Discharge Living Setting: Lives with (comment) (daughter and her wife) Type of Home at Discharge: House Discharge Home Layout: Two level, Able to live on main level with bedroom/bathroom Alternate Level Stairs-Number of Steps: 12 Discharge Home Access: Stairs to enter Entrance Stairs-Rails: None Entrance Stairs-Number of Steps: 1 Discharge Bathroom Shower/Tub: Tub/shower unit Discharge Bathroom Toilet: Standard Discharge Bathroom Accessibility: Yes How Accessible: Accessible via walker Does the patient have any problems obtaining your medications?: No   Social/Family/Support Systems Contact Information: daughter Anticipated Caregiver: daughter and family Anticipated Caregiver's Contact Information: see above Ability/Limitations of Caregiver: daughter works days Caregiver Availability: Intermittent Discharge Plan Discussed with Primary Caregiver: Yes Is Caregiver In Agreement with Plan?: Yes Does Caregiver/Family have Issues  with Lodging/Transportation while Pt is in Rehab?: No   Goals Patient/Family Goal for Rehab: Mod I with PT and OT Expected length of stay: ELOS 7 to 10 days Pt/Family Agrees to Admission and willing to participate: Yes Program Orientation Provided & Reviewed with Pt/Caregiver Including Roles  & Responsibilities: Yes   Decrease burden of Care through IP rehab admission: n/a   Possible need for SNF placement upon discharge: not anticipated   Patient Condition: I have reviewed medical records from [facility], spoken with CM, and patient and son. I met with patient at the bedside for inpatient rehabilitation assessment.  Patient will benefit from ongoing  PT and OT, can actively participate in 3 hours of therapy a day 5 days of the week, and can make measurable gains during the admission.  Patient will also benefit from the coordinated team approach during an Inpatient Acute Rehabilitation admission.  The patient will receive intensive therapy as well as Rehabilitation physician, nursing, social worker, and care management interventions.  Due to bladder management, bowel management, safety, skin/wound care, disease management, medication administration, pain management and patient education the patient requires 24 hour a day rehabilitation nursing.  The patient is currently min assist overall with mobility and basic ADLs.  Discharge setting and therapy post discharge at home with home health is anticipated.  Patient has agreed to participate in the Acute Inpatient Rehabilitation Program and will admit today.   Preadmission Screen Completed By:  Cleatrice Burke, 05/17/2020 9:58 AM ______________________________________________________________________   Discussed status with Dr. Ranell Patrick on 05/17/2020 at 1000 and received approval for admission today.   Admission Coordinator:  Cleatrice Burke, RN, time 1000 Date 05/17/2020   Assessment/Plan: Diagnosis: R BKA 1. Does the need  for close, 24 hr/day Medical supervision in concert with the patient's rehab needs make it unreasonable for this patient to be served in a less intensive setting? Yes 2. Co-Morbidities requiring supervision/potential complications: CKD, neuropathy, COPD with DOE, anemia, PAD, wound drainage 3. Due to bladder management, bowel management, safety, skin/wound care, disease management, medication administration, pain management and patient education, does the patient require 24 hr/day rehab nursing? Yes 4. Does the patient require coordinated care of a physician, rehab nurse, PT, OT to address physical and functional deficits in the context of the above medical diagnosis(es)? Yes Addressing deficits in the following areas: balance, endurance, locomotion, strength, transferring, bowel/bladder control, bathing, dressing, feeding, grooming, toileting, cognition and psychosocial support 5. Can the patient actively participate in an intensive therapy program of at least 3 hrs of therapy 5 days a week? Yes 6. The potential for patient to make measurable gains while on inpatient rehab is excellent 7. Anticipated functional outcomes upon discharge from inpatient rehab: modified independent PT, modified independent OT, independent SLP 8. Estimated rehab length of stay to reach the above functional goals is: 10-14 days 9. Anticipated discharge destination: Home 10. Overall Rehab/Functional Prognosis: excellent     MD Signature: Leeroy Cha, MD        Revision History                                              Note Details  Author Izora Ribas, MD File Time 05/17/2020 10:15 AM  Author Type Physician Status Signed  Last Editor Izora Ribas, MD Service Physical Medicine and Rehabilitation

## 2020-05-17 NOTE — Progress Notes (Signed)
Patient arrived on unit. Rehab schedule, medications and plan of care reviewed, patient states an understanding. Patient is Ax4 and has no complications noted at this time. Patient reports pain 0 out of 10. Patient educated on use of call light. Ryan [name] G Ryan [name]  

## 2020-05-17 NOTE — Progress Notes (Signed)
Inpatient Rehabilitation Medication Review by a Pharmacist  A complete drug regimen review was completed for this patient to identify any potential clinically significant medication issues.  Clinically significant medication issues were identified:  yes   Type of Medication Issue Identified Description of Issue Urgent (address now) Non-Urgent (address on AM team rounds) Plan Plan Accepted by Provider? (Yes / No / Pending AM Rounds)  Drug Interaction(s) (clinically significant)       Duplicate Therapy       Allergy       No Medication Administration End Date       Incorrect Dose       Additional Drug Therapy Needed  Plavix, thiamine ordered on DC summary but not on tx.  Non-urgent Message team to clarify in AM   Other         Name of provider notified for urgent issues identified: Rehab team  Provider Method of Notification: secure chat   For non-urgent medication issues to be resolved on team rounds tomorrow morning a [facility] Secure Chat Handoff was sent to:    Pharmacist comments: Message PA in AM  Time spent performing this drug regimen review (minutes):  10   [place] 05/17/2020 5:52 PM

## 2020-05-17 NOTE — IPOC Note (Signed)
Individualized overall Plan of Care (IPOC) Patient Details Name: Ryan [name] MRN: [id number] DOB: February 23, 1956  Admitting Diagnosis: PAD (peripheral artery disease) [facility])  Hospital Problems: Principal Problem:   PAD (peripheral artery disease) (Ryan [name]) Active Problems:   Below-knee amputation of right lower extremity (Ryan [name])   Essential hypertension   AKI (acute kidney injury) (Ryan [name])   Acute blood loss anemia   Postoperative pain   Phantom limb pain (Ryan [name])      Functional Problem List: Nursing Medication Management, Pain, Skin Integrity  PT Balance, Pain, Safety, Endurance, Skin Integrity  OT Balance, Edema, Endurance, Motor, Pain, Safety, Skin Integrity  SLP    TR         Basic ADL's: OT Bathing, Dressing, Toileting     Advanced  ADL's: OT       Transfers: PT Car, Furniture, Bed to Chair, [employer], Tub/Shower     Locomotion: PT Ambulation, Emergency planning/management officer, Stairs     Additional Impairments: OT None  SLP        TR      Anticipated Outcomes Item Anticipated Outcome  Self Feeding no goal set  Swallowing      Basic self-care  mod I  Toileting  mod I   Bathroom Transfers mod I  Bowel/Bladder  mod I  Transfers  mod I  Locomotion  mod I  Communication     Cognition     Pain  less than 3 out of 10  Safety/Judgment  mod I   Therapy Plan: PT Intensity: Minimum of 1-2 x/day ,45 to 90 minutes PT Frequency: 5 out of 7 days PT Duration Estimated Length of Stay: 3-7 days OT Intensity: Minimum of 1-2 x/day, 45 to 90 minutes OT Frequency: 5 out of 7 days OT Duration/Estimated Length of Stay: 5-7 days      Team Interventions: Nursing Interventions Patient/Family Education, Pain Management, Skin Care/Wound Management, Medication Management, Discharge Planning  PT interventions Ambulation/gait training, Balance/vestibular training, Disease management/prevention, Discharge planning, DME/adaptive equipment instruction, Functional  electrical stimulation, Functional mobility training, Pain management, Psychosocial support, Splinting/orthotics, Therapeutic Activities, UE/LE Strength taining/ROM, Visual/perceptual remediation/compensation, Wheelchair propulsion/positioning, UE/LE Coordination activities, Therapeutic Exercise, Stair training, Skin care/wound management, Patient/family education, Neuromuscular re-education, Cognitive remediation/compensation, Community reintegration  OT Interventions Training and development officer, Engineer, drilling, Patient/family education, Therapeutic Activities, Wheelchair propulsion/positioning, Therapeutic Exercise, Psychosocial support, Functional mobility training, Community reintegration, Self Care/advanced ADL retraining, UE/LE Strength taining/ROM, UE/LE Coordination activities, Discharge planning, Disease mangement/prevention, Neuromuscular re-education, Pain management  SLP Interventions    TR Interventions    SW/CM Interventions Psychosocial Support, Patient/Family Education, Discharge Planning   Barriers to Discharge MD  Medical stability, Wound care, and Weight bearing restrictions  Nursing      PT Inaccessible home environment, Home environment access/layout, Lack of/limited family support, Decreased caregiver support, Wound Care    OT      SLP      SW Decreased caregiver support, Lack of/limited family support     Team Discharge Planning: Destination: PT-Home ,OT- Home , SLP-  Projected Follow-up: PT-Outpatient PT, Home health PT, OT-  Home health OT, SLP-  Projected Equipment Needs: PT-To be determined, OT- Tub/shower bench, SLP-  Equipment Details: PT-pt has WC and RW at home, OT-  Patient/family involved in discharge planning: PT- Patient,  OT-Patient, SLP-   MD ELOS: 4-6 days. Medical Rehab Prognosis:  Good Assessment: 64 year old male with history of CKD, neuropathy,  COPD w/ DOE, Anemia,  PAD s/p multiple revascularization procedure with attempts  at  limb salvage but with progressive RLE wounds with ischemia and claudication.  He was admitted on 05/09/20 for R-BKA by Dr. Donzetta [name]. Post op with ABLA which was being monitored. Acute on chronic renal failure was resolving and he continues to have oozing from incision line.  Patient with resulting functional deficits with mobility, transfers, self-care.  We will set goals for mod I with PT/OT.  Due to the current state of emergency, patients may not be receiving their 3-hours of Medicare-mandated therapy.  See Team Conference Notes for weekly updates to the plan of care

## 2020-05-18 DIAGNOSIS — S88111A Complete traumatic amputation at level between knee and ankle, right lower leg, initial encounter: Secondary | ICD-10-CM

## 2020-05-18 DIAGNOSIS — G8918 Other acute postprocedural pain: Secondary | ICD-10-CM

## 2020-05-18 DIAGNOSIS — I739 Peripheral vascular disease, unspecified: Secondary | ICD-10-CM

## 2020-05-18 DIAGNOSIS — N179 Acute kidney failure, unspecified: Secondary | ICD-10-CM

## 2020-05-18 DIAGNOSIS — D62 Acute posthemorrhagic anemia: Secondary | ICD-10-CM

## 2020-05-18 DIAGNOSIS — I10 Essential (primary) hypertension: Secondary | ICD-10-CM

## 2020-05-18 LAB — CBC WITH DIFFERENTIAL/PLATELET
Abs Immature Granulocytes: 0.05 10*3/uL (ref 0.00–0.07)
Basophils Absolute: 0 10*3/uL (ref 0.0–0.1)
Basophils Relative: 0 %
Eosinophils Absolute: 0.2 10*3/uL (ref 0.0–0.5)
Eosinophils Relative: 2 %
HCT: 30.7 % — ABNORMAL LOW (ref 39.0–52.0)
Hemoglobin: 10 g/dL — ABNORMAL LOW (ref 13.0–17.0)
Immature Granulocytes: 1 %
Lymphocytes Relative: 20 %
Lymphs Abs: 1.5 10*3/uL (ref 0.7–4.0)
MCH: 30.8 pg (ref 26.0–34.0)
MCHC: 32.6 g/dL (ref 30.0–36.0)
MCV: 94.5 fL (ref 80.0–100.0)
Monocytes Absolute: 0.7 10*3/uL (ref 0.1–1.0)
Monocytes Relative: 9 %
Neutro Abs: 5 10*3/uL (ref 1.7–7.7)
Neutrophils Relative %: 68 %
Platelets: 424 10*3/uL — ABNORMAL HIGH (ref 150–400)
RBC: 3.25 MIL/uL — ABNORMAL LOW (ref 4.22–5.81)
RDW: 11.9 % (ref 11.5–15.5)
WBC: 7.4 10*3/uL (ref 4.0–10.5)
nRBC: 0 % (ref 0.0–0.2)

## 2020-05-18 LAB — COMPREHENSIVE METABOLIC PANEL
ALT: 29 U/L (ref 0–44)
AST: 31 U/L (ref 15–41)
Albumin: 3.1 g/dL — ABNORMAL LOW (ref 3.5–5.0)
Alkaline Phosphatase: 74 U/L (ref 38–126)
Anion gap: 13 (ref 5–15)
BUN: 37 mg/dL — ABNORMAL HIGH (ref 8–23)
CO2: 23 mmol/L (ref 22–32)
Calcium: 9.2 mg/dL (ref 8.9–10.3)
Chloride: 99 mmol/L (ref 98–111)
Creatinine, Ser: 1.92 mg/dL — ABNORMAL HIGH (ref 0.61–1.24)
GFR, Estimated: 39 mL/min — ABNORMAL LOW (ref >60)
Glucose, Bld: 100 mg/dL — ABNORMAL HIGH (ref 70–99)
Potassium: 4.8 mmol/L (ref 3.5–5.1)
Sodium: 135 mmol/L (ref 135–145)
Total Bilirubin: 0.5 mg/dL (ref 0.3–1.2)
Total Protein: 7.1 g/dL (ref 6.5–8.1)

## 2020-05-18 NOTE — Progress Notes (Signed)
[facility] PHYSICAL MEDICINE & REHABILITATION PROGRESS NOTE  Subjective/Complaints: Patient seen sitting up in bed this morning.  He states he slept well overnight.  He notes constipation.  ROS: + Constipation. Denies CP, SOB, N/V/D  Objective: Vital Signs: Blood pressure 113/62, pulse 63, temperature 98.1 F (36.7 C), resp. rate 18, height 5\' 8"  (1.727 m), weight 61.7 kg, SpO2 95 %. No results found. Recent Labs    05/18/20 0400  WBC 7.4  HGB 10.0*  HCT 30.7*  PLT 424*   Recent Labs    05/18/20 0400  NA 135  K 4.8  CL 99  CO2 23  GLUCOSE 100*  BUN 37*  CREATININE 1.92*  CALCIUM 9.2    Intake/Output Summary (Last 24 hours) at 05/18/2020 1009 Last data filed at 05/18/2020 0723 Gross per 24 hour  Intake 600 ml  Output 1300 ml  Net -700 ml        Physical Exam: BP 113/62 (BP Location: Left Arm)   Pulse 63   Temp 98.1 F (36.7 C)   Resp 18   Ht 5\' 8"  (1.727 m)   Wt 61.7 kg   SpO2 95%   BMI 20.68 kg/m  Constitutional: No distress . Vital signs reviewed. HENT: Normocephalic.  Atraumatic. Eyes: EOMI. No discharge. Cardiovascular: No JVD.  RRR. Respiratory: Normal effort.  No stridor.  Bilateral clear to auscultation. GI: Distended.  Bowel sounds slowed. Skin: Warm and dry.  Right BKA with serosanguineous drainage along staple lines with some displacement of staples Psych: Normal mood.  Normal behavior. Musc: Right BKA with edema and tenderness Neuro:  Alert Motor: Bilateral upper extremities, left lower extremity: 5/5 proximal distal Right lower extremity: In flexion, knee extension 3 -/5 (pain inhibition)  Assessment/Plan: 1. Functional deficits which require 3+ hours per day of interdisciplinary therapy in a comprehensive inpatient rehab setting.  Physiatrist is providing close team supervision and 24 hour management of active medical problems listed below.  Physiatrist and rehab team continue to assess barriers to discharge/monitor patient progress  toward functional and medical goals   Care Tool:  Bathing              Bathing assist       Upper Body Dressing/Undressing Upper body dressing        Upper body assist      Lower Body Dressing/Undressing Lower body dressing            Lower body assist       Toileting Toileting    Toileting assist       Transfers Chair/bed transfer  Transfers assist           Locomotion Ambulation   Ambulation assist              Walk 10 feet activity   Assist           Walk 50 feet activity   Assist           Walk 150 feet activity   Assist           Walk 10 feet on uneven surface  activity   Assist           Wheelchair     Assist               Wheelchair 50 feet with 2 turns activity    Assist            Wheelchair 150 feet activity     Assist  Medical Problem List and Plan: 1.  Impaired mobility and ADLs secondary to right BKA  Begin CIR evaluations tomorrow 2.  Antithrombotics: -DVT/anticoagulation:  Pharmaceutical: Lovenox             -antiplatelet therapy: on ASA 3. Pain Management: has been using oxycodone 10 mg every 4 hours.   Monitor with increased mobility 4. Mood: LCSW to follow for evaluation and support.              -antipsychotic agents: N/a 5. Neuropsych: This patient is capable of making decisions on his own behalf. 6. Skin/Wound Care: Monitor wound drainage/healing.  Added multivitamin, Vitamin C and protein supplement to promote healing.  7. Fluids/Electrolytes/Nutrition: Monitor I/Os. Discontinued IVF.  8. ABLA:   Hemoglobin 10.0 on 11/25  Continue to monitor 9. Acute on chronic renal failure (CKD3):   Creatinine 1.92 on 11/25, repeat labs tomorrow  Encourage fluids 10. HTN: Monitor BP --continue Avapro and amlodipine-valsartan 5-160mg .   Monitor with increased mobility  11. COPD: Respiratory status stable on Breo and Incruse. Currently satting well on  room air.   LOS: 1 days A FACE TO FACE EVALUATION WAS PERFORMED  Janissa Bertram Lorie Phenix 05/18/2020, 10:09 AM

## 2020-05-19 ENCOUNTER — Inpatient Hospital Stay (HOSPITAL_COMMUNITY): Payer: 59

## 2020-05-19 ENCOUNTER — Inpatient Hospital Stay (HOSPITAL_COMMUNITY): Payer: 59 | Admitting: Occupational Therapy

## 2020-05-19 ENCOUNTER — Inpatient Hospital Stay (HOSPITAL_COMMUNITY): Payer: 59 | Admitting: Physical Therapy

## 2020-05-19 DIAGNOSIS — G546 Phantom limb syndrome with pain: Secondary | ICD-10-CM

## 2020-05-19 LAB — BASIC METABOLIC PANEL
Anion gap: 11 (ref 5–15)
BUN: 42 mg/dL — ABNORMAL HIGH (ref 8–23)
CO2: 22 mmol/L (ref 22–32)
Calcium: 8.8 mg/dL — ABNORMAL LOW (ref 8.9–10.3)
Chloride: 101 mmol/L (ref 98–111)
Creatinine, Ser: 2.09 mg/dL — ABNORMAL HIGH (ref 0.61–1.24)
GFR, Estimated: 35 mL/min — ABNORMAL LOW (ref >60)
Glucose, Bld: 98 mg/dL (ref 70–99)
Potassium: 4.7 mmol/L (ref 3.5–5.1)
Sodium: 134 mmol/L — ABNORMAL LOW (ref 135–145)

## 2020-05-19 MED ORDER — GABAPENTIN 100 MG PO CAPS
100.0000 mg | ORAL_CAPSULE | Freq: Three times a day (TID) | ORAL | Status: DC
  Administered 2020-05-19 – 2020-05-24 (×16): 100 mg via ORAL
  Filled 2020-05-19 (×16): qty 1

## 2020-05-19 MED ORDER — THIAMINE HCL 100 MG PO TABS
50.0000 mg | ORAL_TABLET | Freq: Every day | ORAL | Status: DC
  Administered 2020-05-19 – 2020-05-24 (×6): 50 mg via ORAL
  Filled 2020-05-19 (×6): qty 1

## 2020-05-19 MED ORDER — CLOPIDOGREL BISULFATE 75 MG PO TABS
75.0000 mg | ORAL_TABLET | Freq: Every day | ORAL | Status: DC
  Administered 2020-05-19 – 2020-05-24 (×6): 75 mg via ORAL
  Filled 2020-05-19 (×7): qty 1

## 2020-05-19 NOTE — Evaluation (Signed)
Physical Therapy Assessment and Plan  Patient Details  Name: Ryan [name] MRN: [id number] Date of Birth: 02-May-1956  PT Diagnosis: Abnormality of gait, Difficulty walking, Muscle weakness and Pain in R LE Rehab Potential: Excellent ELOS: 3-7 days   Today's Date: 05/19/2020 PT Individual Time: 0803-0900 PT Individual Time Calculation (min): 29 min    Hospital Problem: Principal Problem:   PAD (peripheral artery disease) ([place]) Active Problems:   Below-knee amputation of right lower extremity ([place])   Essential hypertension   AKI (acute kidney injury) ([place])   Acute blood loss anemia   Postoperative pain   Past Medical History:  Past Medical History:  Diagnosis Date  . CKD (chronic kidney disease)   . History of blood transfusion   . Hyperlipidemia   . Hypertension   . Neuromuscular disorder (HCC)    numbness feet  . Peripheral vascular disease ([place])   . Shortness of breath dyspnea    occasional    Past Surgical History:  Past Surgical History:  Procedure Laterality Date  . ABDOMINAL AORTOGRAM W/LOWER EXTREMITY N/A 09/13/2019   Procedure: ABDOMINAL AORTOGRAM W/LOWER EXTREMITY;  Surgeon: Waynetta Sandy, MD;  Location: [facility];  Service: Cardiovascular;  Laterality: N/A;  . AMPUTATION Right 05/09/2020   Procedure: RIGHT BELOW KNEE AMPUTATION;  Surgeon: Waynetta Sandy, MD;  Location: [facility];  Service: Vascular;  Laterality: Right;  w/ a block  . COLONOSCOPY WITH PROPOFOL N/A 02/15/2016   Procedure: COLONOSCOPY WITH PROPOFOL;  Surgeon: Lucilla Lame, MD;  Location: [facility];  Service: Endoscopy;  Laterality: N/A;  . FEMORAL-POPLITEAL BYPASS GRAFT Right 09/14/2019   Procedure: BYPASS GRAFT FEMORAL-POPLITEAL ARTERY;  Surgeon: Waynetta Sandy, MD;  Location: [facility];  Service: Vascular;  Laterality: Right;  . HERNIA REPAIR  1999   left inguinal  . INSERTION OF ILIAC STENT Right 09/14/2019   Procedure: Insertion Of Common and  External Iliac Stent;  Surgeon: Waynetta Sandy, MD;  Location: [facility];  Service: Vascular;  Laterality: Right;  . LOWER EXTREMITY ANGIOGRAPHY Right 05/07/2018   Procedure: LOWER EXTREMITY ANGIOGRAPHY;  Surgeon: Algernon Huxley, MD;  Location: [facility];  Service: Cardiovascular;  Laterality: Right;  . LOWER EXTREMITY ANGIOGRAPHY Right 06/25/2018   Procedure: LOWER EXTREMITY ANGIOGRAPHY;  Surgeon: Algernon Huxley, MD;  Location: [facility];  Service: Cardiovascular;  Laterality: Right;  . LOWER EXTREMITY ANGIOGRAPHY Right 07/01/2018   Procedure: LOWER EXTREMITY ANGIOGRAPHY;  Surgeon: Algernon Huxley, MD;  Location: [facility];  Service: Cardiovascular;  Laterality: Right;  . LOWER EXTREMITY ANGIOGRAPHY Right 08/12/2018   Procedure: LOWER EXTREMITY ANGIOGRAPHY;  Surgeon: Algernon Huxley, MD;  Location: [facility];  Service: Cardiovascular;  Laterality: Right;  . LOWER EXTREMITY ANGIOGRAPHY Right 09/03/2018   Procedure: LOWER EXTREMITY ANGIOGRAPHY;  Surgeon: Algernon Huxley, MD;  Location: [facility];  Service: Cardiovascular;  Laterality: Right;  . LOWER EXTREMITY ANGIOGRAPHY Right 09/04/2018   Procedure: Lower Extremity Angiography;  Surgeon: Algernon Huxley, MD;  Location: [facility];  Service: Cardiovascular;  Laterality: Right;  . LOWER EXTREMITY ANGIOGRAPHY Right 10/01/2018   Procedure: LOWER EXTREMITY ANGIOGRAPHY;  Surgeon: Algernon Huxley, MD;  Location: [facility];  Service: Cardiovascular;  Laterality: Right;  . LOWER EXTREMITY ANGIOGRAPHY Right 10/01/2018   Procedure: Lower Extremity Angiography;  Surgeon: Algernon Huxley, MD;  Location: [facility];  Service: Cardiovascular;  Laterality: Right;  . LOWER EXTREMITY ANGIOGRAPHY Right  10/15/2018   Procedure: LOWER EXTREMITY ANGIOGRAPHY;  Surgeon: Algernon Huxley, MD;  Location: [facility];  Service: Cardiovascular;  Laterality: Right;  . LOWER EXTREMITY ANGIOGRAPHY Left 10/16/2018    Procedure: Lower Extremity Angiography;  Surgeon: Algernon Huxley, MD;  Location: [facility];  Service: Cardiovascular;  Laterality: Left;  . LOWER EXTREMITY ANGIOGRAPHY Left 01/20/2019   Procedure: LOWER EXTREMITY ANGIOGRAPHY;  Surgeon: Algernon Huxley, MD;  Location: [facility];  Service: Cardiovascular;  Laterality: Left;  . LOWER EXTREMITY ANGIOGRAPHY Right 01/21/2019   Procedure: Lower Extremity Angiography;  Surgeon: Algernon Huxley, MD;  Location: [facility];  Service: Cardiovascular;  Laterality: Right;  . LOWER EXTREMITY ANGIOGRAPHY Right 01/28/2019   Procedure: LOWER EXTREMITY ANGIOGRAPHY;  Surgeon: Algernon Huxley, MD;  Location: [facility];  Service: Cardiovascular;  Laterality: Right;  . LOWER EXTREMITY ANGIOGRAPHY Right 01/29/2019   Procedure: Lower Extremity Angiography;  Surgeon: Algernon Huxley, MD;  Location: [facility];  Service: Cardiovascular;  Laterality: Right;  . LOWER EXTREMITY ANGIOGRAPHY Right 04/04/2020   Procedure: LOWER EXTREMITY ANGIOGRAPHY;  Surgeon: Waynetta Sandy, MD;  Location: [facility];  Service: Cardiovascular;  Laterality: Right;  . LOWER EXTREMITY INTERVENTION Right 07/02/2018   Procedure: LOWER EXTREMITY INTERVENTION;  Surgeon: Algernon Huxley, MD;  Location: [facility];  Service: Cardiovascular;  Laterality: Right;  . PERIPHERAL VASCULAR BALLOON ANGIOPLASTY Left 04/04/2020   Procedure: PERIPHERAL VASCULAR BALLOON ANGIOPLASTY;  Surgeon: Waynetta Sandy, MD;  Location: [facility];  Service: Cardiovascular;  Laterality: Left;  external iliac  . POLYPECTOMY N/A 02/15/2016   Procedure: POLYPECTOMY;  Surgeon: Lucilla Lame, MD;  Location: [facility];  Service: Endoscopy;  Laterality: N/A;    Assessment & Plan Clinical Impression: Patient is a 64 y.o. year old male with history of CKD, neuropathy,  COPD w/ DOE, Anemia,  PAD s/p multiple revascularization procedure with attempts at limb  salvage but with progressive RLE wounds with ischemia and claudication.  He was admitted on 05/09/20 for R-BKA by Dr. [name]. Post op with ABLA which was being monitored. Acute on chronic renal failure was resolving and he continues to have oozing from incision line.  Patient transferred to CIR on 05/17/2020 .   Patient currently requires min with mobility secondary to muscle weakness and muscle joint tightness, decreased cardiorespiratoy endurance and decreased standing balance and decreased balance strategies.  Prior to hospitalization, patient was independent  with mobility and lived with Daughter in a House home.  Home access is 1Stairs to enter.  Patient will benefit from skilled PT intervention to maximize safe functional mobility, minimize fall risk and decrease caregiver burden for planned discharge home with intermittent assist.  Anticipate patient will benefit from follow up [facility] or OPPT (pending transportation) at discharge.  PT - End of Session Activity Tolerance: Tolerates 30+ min activity with multiple rests Endurance Deficit: Yes Endurance Deficit Description: generalized weakness PT Assessment Rehab Potential (ACUTE/IP ONLY): Excellent PT Barriers to Discharge: [place] home environment;Home environment access/layout;Lack of/limited family support;Decreased caregiver support;Wound Care PT Patient demonstrates impairments in the following area(s): Balance;Pain;Safety;Endurance;Skin Integrity PT Transfers Functional Problem(s): Car;Furniture;Bed to Chair;Bed Mobility PT Locomotion Functional Problem(s): Ambulation;Wheelchair Mobility;Stairs PT Plan PT Intensity: Minimum of 1-2 x/day ,45 to 90 minutes PT Frequency: 5 out of 7 days PT Duration Estimated Length of Stay: 3-7 days PT Treatment/Interventions: Ambulation/gait training;Balance/vestibular training;Disease management/prevention;Discharge planning;DME/adaptive equipment instruction;Functional electrical stimulation;Functional  mobility training;Pain management;Psychosocial support;Splinting/orthotics;Therapeutic  Activities;UE/LE Strength taining/ROM;Visual/perceptual remediation/compensation;Wheelchair propulsion/positioning;UE/LE Coordination activities;Therapeutic Exercise;Stair training;Skin care/wound management;Patient/family education;Neuromuscular re-education;Cognitive remediation/compensation;Community reintegration PT Transfers Anticipated Outcome(s): mod I PT Locomotion Anticipated Outcome(s): mod I PT Recommendation Recommendations for Other Services: Therapeutic Recreation consult Therapeutic Recreation Interventions: Pet therapy;Outing/community reintergration;Kitchen group Follow Up Recommendations: Outpatient PT;Home health PT Patient destination: Home Equipment Recommended: To be determined Equipment Details: pt has WC and RW at home   PT Evaluation Precautions/Restrictions Precautions Precautions: Fall Restrictions Weight Bearing Restrictions: Yes RLE Weight Bearing: Non weight bearing General Chart Reviewed: Yes Response to Previous Treatment: Not applicable Family/Caregiver Present: No Vital Signs  Pain Pain Assessment Pain Scale: 0-10 Pain Score: 8  (phantom pain) Pain Location: Leg (feels pain in his foot and back of his calf (phantom)) Pain Orientation: Right Pain Descriptors / Indicators: Burning Pain Onset: On-going Patients Stated Pain Goal: 4 Pain Intervention(s): Medication (See eMAR) Multiple Pain Sites: No Home Living/Prior Functioning Home Living Available Help at Discharge: Available PRN/intermittently;Family (will be alone from 5:30AM to 5PM) Type of Home: House Home Access: Stairs to enter [employer] of Steps: 1 Entrance Stairs-Rails: None Home Layout: One level Bathroom Shower/Tub: Chiropodist: Standard Bathroom Accessibility: Yes Additional Comments: plans to get a tub bench  Lives With: Daughter Prior Function Level of  Independence: Independent with basic ADLs;Independent with gait;Independent with homemaking with ambulation;Independent with transfers  Able to Take Stairs?: Yes Driving: Yes Vocation: Retired Comments: Reports independent IADLs, ADLS, driving, able to ambulate short community distances Vision/Perception  Perception Perception: Within Functional Limits Praxis Praxis: Intact  Cognition Overall Cognitive Status: Within Functional Limits for tasks assessed Orientation Level: Oriented X4 Sensation Sensation Light Touch: Not tested Coordination Gross Motor Movements are Fluid and Coordinated: Yes Fine Motor Movements are Fluid and Coordinated: Yes Motor  Motor Motor: Within Functional Limits  Trunk/Postural Assessment  Cervical Assessment Cervical Assessment: Within Functional Limits Thoracic Assessment Thoracic Assessment: Within Functional Limits Lumbar Assessment Lumbar Assessment: Within Functional Limits Postural Control Postural Control: Within Functional Limits  Balance Balance Balance Assessed: Yes Static Sitting Balance Static Sitting - Balance Support: No upper extremity supported Static Sitting - Level of Assistance: 5: Stand by assistance Dynamic Sitting Balance Dynamic Sitting - Balance Support: Bilateral upper extremity supported Dynamic Sitting - Level of Assistance: 4: Min assist Dynamic Sitting - Balance Activities: Lateral lean/weight shifting;Forward lean/weight shifting Static Standing Balance Static Standing - Balance Support: Bilateral upper extremity supported Static Standing - Level of Assistance: 5: Stand by assistance Dynamic Standing Balance Dynamic Standing - Balance Support: Bilateral upper extremity supported Dynamic Standing - Level of Assistance: 4: Min assist Extremity Assessment   RLE Assessment RLE Assessment: Exceptions to [facility] Active Range of Motion (AROM) Comments: ROM grossly WFL, except R knee extension (slightly limited) General  Strength Comments: NT due to R BKA LLE Assessment LLE Assessment: Within Functional Limits General Strength Comments: not formally tested, however, able to ambulate 84f with RW and minA with no complaints of fatigue  Care Tool Care Tool Bed Mobility Roll left and right activity   Roll left and right assist level: Supervision/Verbal cueing    Sit to lying activity   Sit to lying assist level: Supervision/Verbal cueing    Lying to sitting edge of bed activity   Lying to sitting edge of bed assist level: Contact Guard/Touching assist     Care Tool Transfers Sit to stand transfer   Sit to stand assist level: Minimal Assistance - Patient > 75%    Chair/bed transfer   Chair/bed transfer assist  level: Minimal Assistance - Patient > 75%     Toilet transfer   Assist Level: Minimal Assistance - Patient > 75%    Car transfer   Car transfer assist level: Minimal Assistance - Patient > 75%      Care Tool Locomotion Ambulation   Assist level: Minimal Assistance - Patient > 75% Assistive device: Walker-rolling Max distance: 62f  Walk 10 feet activity   Assist level: Minimal Assistance - Patient > 75%     Walk 50 feet with 2 turns activity   Assist level: Minimal Assistance - Patient > 75%    Walk 150 feet activity Walk 150 feet activity did not occur: Safety/medical concerns   Assistive device: Walker-rolling  Walk 10 feet on uneven surfaces activity   Assist level: Minimal Assistance - Patient > 75% Assistive device: Walker-rolling  Stairs   Assist level: Minimal Assistance - Patient > 75% Stairs assistive device: 2 hand rails Max number of stairs: 1  Walk up/down 1 step activity   Walk up/down 1 step (curb) assist level: Minimal Assistance - Patient > 75% Walk up/down 1 step or curb assistive device: 2 hand rails Walk up/down 4 steps activity did not occuR: Safety/medical concerns  Walk up/down 4 steps activity      Walk up/down 12 steps activity Walk up/down 12 steps  activity did not occur: Safety/medical concerns      Pick up small objects from floor Pick up small object from the floor (from standing position) activity did not occur: Safety/medical concerns      Wheelchair Will patient use wheelchair at discharge?: Yes Type of Wheelchair: Manual   Wheelchair assist level: Supervision/Verbal cueing Max wheelchair distance: 2037f Wheel 50 feet with 2 turns activity   Assist Level: Supervision/Verbal cueing  Wheel 150 feet activity   Assist Level: Supervision/Verbal cueing    Refer to Care Plan for Long Term Goals  SHORT TERM GOAL WEEK 1 PT Short Term Goal 1 (Week 1): STGs=LTGs  Recommendations for other services: Therapeutic Recreation  Pet therapy and Outing/community reintegration  Skilled Therapeutic Intervention Mobility Bed Mobility Bed Mobility: Sitting - Scoot to [employer] Bed;Sit to Supine;Scooting to HOTeachers Insurance and Annuity Associationitting - Scoot to [employer] Bed: Contact Guard/Touching assist Sit to Supine: Contact Guard/Touching assist Scooting to HOB: Contact Guard/Touching assist Transfers Transfers: Anterior-Posterior Transfer;Sit to Stand;Stand to Sit;Stand Pivot Transfers Sit to Stand: Minimal Assistance - Patient > 75% Stand to Sit: Minimal Assistance - Patient > 75% Stand Pivot Transfers: Minimal Assistance - Patient > 75% Stand Pivot Transfer Details: Verbal cues for safe use of DME/AE;Verbal cues for precautions/safety Anterior-Posterior Transfer: Minimal Assistance - Patient > 75% Transfer (Assistive device): Rolling walker Locomotion  Gait Ambulation: Yes Gait Assistance: Minimal Assistance - Patient > 75% Gait Distance (Feet): 50 Feet Assistive device: Rolling walker Gait Assistance Details: Verbal cues for safe use of DME/AE;Verbal cues for precautions/safety Gait Gait: Yes Gait Pattern: Within Functional Limits;Step-to pattern (RW step to pattern due to R BKA) Gait velocity: grossly decreased Stairs / Additional Locomotion Stairs:  Yes Stairs Assistance: Minimal Assistance - Patient > 75% Stair Management Technique: Two rails (parallel bars with 6 in step) Number of Stairs: 1 Height of Stairs: 6 Ramp: Minimal Assistance - Patient >75% Curb: Minimal Assistance - Patient >75% WhProduct managerobility: Yes Wheelchair Assistance: Supervision/Verbal cueing Wheelchair Propulsion: Both upper extremities Distance: 200  Pt received semi-reclined in bed and agreeable to PT. Pt requests to transfer via anterior-posterior transfer to bedside toilet to attempt voiding  bowels. Pt performs transfer with min-assist in both directions. Pt unable to void at this time. While attempting to void, pt educated about his POC for IP rehab and the importance of maintaining mobility and strength in his R LE for future use of prosthesis.   Pt performed stand pivot transfer with RW and min assist to [facility], then transported to main therapy gym with total assist for time management and energy conservation. Pt performed ~17f ambulation, 1 step in parallel bars, ramp ambulation and car transfer with min assist. Pt able to propel WC with supervision ~2064f   Pt transported back to room total assist due to noted fatigue. [facility]W and min assist and able to position himself with supervision. Pt left in bed with bed alarm on, needs met, and call bell in reach.   Discharge Criteria: Patient will be discharged from PT if patient refuses treatment 3 consecutive times without medical reason, if treatment goals not met, if there is a change in medical status, if patient makes no progress towards goals or if patient is discharged from hospital.  The above assessment, treatment plan, treatment alternatives and goals were discussed and mutually agreed upon: by patient  BaGaylord ShihSPT 05/19/2020, 9:49 AM

## 2020-05-19 NOTE — Care Management (Signed)
[facility] Individual Statement of Services  Patient Name:  Ryan [name]  Date:  05/19/2020  Welcome to the [facility].  Our goal is to provide you with an individualized program based on your diagnosis and situation, designed to meet your specific needs.  With this comprehensive rehabilitation program, you will be expected to participate in at least 3 hours of rehabilitation therapies Monday-Friday, with modified therapy programming on the weekends.  Your rehabilitation program will include the following services:  Physical Therapy (PT), Occupational Therapy (OT), 24 hour per day rehabilitation nursing, Therapeutic Recreaction (TR), Psychology, Neuropsychology, Care Coordinator, Rehabilitation Medicine, Nutrition Services, Pharmacy Services and Other  Weekly team conferences will be held on Tuesdays to discuss your progress.  Your Inpatient Rehabilitation Care Coordinator will talk with you frequently to get your input and to update you on team discussions.  Team conferences with you and your family in attendance may also be held.  Expected length of stay: 3- 7 days  Overall anticipated outcome: Independent with an Assistive Device  Depending on your progress and recovery, your program may change. Your Inpatient Rehabilitation Care Coordinator will coordinate services and will keep you informed of any changes. Your Inpatient Rehabilitation Care Coordinator's name and contact numbers are listed  below.  The following services may also be recommended but are not provided by the [facility] will be made to provide these services after discharge if needed.  Arrangements include referral to agencies that provide these services.  Your insurance has been verified to be:  [place]  primary doctor is:  Steele Sizer  Pertinent information will be shared with your doctor and your insurance company.  Inpatient Rehabilitation Care Coordinator:  Cathleen Corti [phone] or [phone]  Information discussed with and copy given to patient by: Rana Snare, 05/19/2020, 4:36 PM

## 2020-05-19 NOTE — Progress Notes (Signed)
Inpatient Rehabilitation  Patient information reviewed and entered into eRehab system by Analeigha Nauman M. Kaley Jutras, M.A., CCC/SLP, PPS Coordinator.  Information including medical coding, functional ability and quality indicators will be reviewed and updated through discharge.    

## 2020-05-19 NOTE — Progress Notes (Signed)
Patient Details  Name: Ryan [name] MRN: [id number] Date of Birth: 10-Jul-1955  Today's Date: 05/19/2020  Hospital Problems: Principal Problem:   PAD (peripheral artery disease) ([place]) Active Problems:   Below-knee amputation of right lower extremity ([place])   Essential hypertension   AKI (acute kidney injury) ([place])   Acute blood loss anemia   Postoperative pain   Phantom limb pain ([place])  Past Medical History:  Past Medical History:  Diagnosis Date  . CKD (chronic kidney disease)   . History of blood transfusion   . Hyperlipidemia   . Hypertension   . Neuromuscular disorder (H[facility])    numbness feet  . Peripheral vascular disease ([place])   . Shortness of breath dyspnea    occasional    Past Surgical History:  Past Surgical History:  Procedure Laterality Date  . ABDOMINAL AORTOGRAM W/LOWER EXTREMITY N/A 09/13/2019   Procedure: ABDOMINAL AORTOGRAM W/LOWER EXTREMITY;  Surgeon: Waynetta Sandy, MD;  Location: [facility];  Service: Cardiovascular;  Laterality: N/A;  . AMPUTATION Right 05/09/2020   Procedure: RIGHT BELOW KNEE AMPUTATION;  Surgeon: Waynetta Sandy, MD;  Location: [facility];  Service: Vascular;  Laterality: Right;  w/ a block  . COLONOSCOPY WITH PROPOFOL N/A 02/15/2016   Procedure: COLONOSCOPY WITH PROPOFOL;  Surgeon: Lucilla Lame, MD;  Location: [facility];  Service: Endoscopy;  Laterality: N/A;  . FEMORAL-POPLITEAL BYPASS GRAFT Right 09/14/2019   Procedure: BYPASS GRAFT FEMORAL-POPLITEAL ARTERY;  Surgeon: Waynetta Sandy, MD;  Location: [facility];  Service: Vascular;  Laterality: Right;  . HERNIA REPAIR  1999   left inguinal  . INSERTION OF ILIAC STENT Right 09/14/2019   Procedure: Insertion Of Common and External Iliac Stent;  Surgeon: Waynetta Sandy, MD;  Location: [facility];  Service: Vascular;  Laterality: Right;  . LOWER EXTREMITY ANGIOGRAPHY Right 05/07/2018   Procedure: LOWER EXTREMITY ANGIOGRAPHY;  Surgeon: Algernon Huxley, MD;  Location: [facility];  Service: Cardiovascular;  Laterality: Right;  . LOWER EXTREMITY ANGIOGRAPHY Right 06/25/2018   Procedure: LOWER EXTREMITY ANGIOGRAPHY;  Surgeon: Algernon Huxley, MD;  Location: [facility];  Service: Cardiovascular;  Laterality: Right;  . LOWER EXTREMITY ANGIOGRAPHY Right 07/01/2018   Procedure: LOWER EXTREMITY ANGIOGRAPHY;  Surgeon: Algernon Huxley, MD;  Location: [facility];  Service: Cardiovascular;  Laterality: Right;  . LOWER EXTREMITY ANGIOGRAPHY Right 08/12/2018   Procedure: LOWER EXTREMITY ANGIOGRAPHY;  Surgeon: Algernon Huxley, MD;  Location: [facility];  Service: Cardiovascular;  Laterality: Right;  . LOWER EXTREMITY ANGIOGRAPHY Right 09/03/2018   Procedure: LOWER EXTREMITY ANGIOGRAPHY;  Surgeon: Algernon Huxley, MD;  Location: [facility];  Service: Cardiovascular;  Laterality: Right;  . LOWER EXTREMITY ANGIOGRAPHY Right 09/04/2018   Procedure: Lower Extremity Angiography;  Surgeon: Algernon Huxley, MD;  Location: [facility];  Service: Cardiovascular;  Laterality: Right;  . LOWER EXTREMITY ANGIOGRAPHY Right 10/01/2018   Procedure: LOWER EXTREMITY ANGIOGRAPHY;  Surgeon: Algernon Huxley, MD;  Location: [facility];  Service: Cardiovascular;  Laterality: Right;  . LOWER EXTREMITY ANGIOGRAPHY Right 10/01/2018   Procedure: Lower Extremity Angiography;  Surgeon: Algernon Huxley, MD;  Location: [facility];  Service: Cardiovascular;  Laterality: Right;  . LOWER EXTREMITY ANGIOGRAPHY Right 10/15/2018   Procedure: LOWER EXTREMITY ANGIOGRAPHY;  Surgeon: Algernon Huxley, MD;  Location: [facility];  Service: Cardiovascular;  Laterality: Right;  . LOWER EXTREMITY ANGIOGRAPHY Left 10/16/2018   Procedure:  Lower Extremity Angiography;  Surgeon: Algernon Huxley, MD;  Location: [facility];  Service: Cardiovascular;  Laterality: Left;  . LOWER EXTREMITY ANGIOGRAPHY Left 01/20/2019   Procedure: LOWER EXTREMITY  ANGIOGRAPHY;  Surgeon: Algernon Huxley, MD;  Location: [facility];  Service: Cardiovascular;  Laterality: Left;  . LOWER EXTREMITY ANGIOGRAPHY Right 01/21/2019   Procedure: Lower Extremity Angiography;  Surgeon: Algernon Huxley, MD;  Location: [facility];  Service: Cardiovascular;  Laterality: Right;  . LOWER EXTREMITY ANGIOGRAPHY Right 01/28/2019   Procedure: LOWER EXTREMITY ANGIOGRAPHY;  Surgeon: Algernon Huxley, MD;  Location: [facility];  Service: Cardiovascular;  Laterality: Right;  . LOWER EXTREMITY ANGIOGRAPHY Right 01/29/2019   Procedure: Lower Extremity Angiography;  Surgeon: Algernon Huxley, MD;  Location: [facility] [facility];  Service: Cardiovascular;  Laterality: Right;  . LOWER EXTREMITY ANGIOGRAPHY Right 04/04/2020   Procedure: LOWER EXTREMITY ANGIOGRAPHY;  Surgeon: Waynetta Sandy, MD;  Location: [facility];  Service: Cardiovascular;  Laterality: Right;  . LOWER EXTREMITY INTERVENTION Right 07/02/2018   Procedure: LOWER EXTREMITY INTERVENTION;  Surgeon: Algernon Huxley, MD;  Location: [facility];  Service: Cardiovascular;  Laterality: Right;  . PERIPHERAL VASCULAR BALLOON ANGIOPLASTY Left 04/04/2020   Procedure: PERIPHERAL VASCULAR BALLOON ANGIOPLASTY;  Surgeon: Waynetta Sandy, MD;  Location: [facility];  Service: Cardiovascular;  Laterality: Left;  external iliac  . POLYPECTOMY N/A 02/15/2016   Procedure: POLYPECTOMY;  Surgeon: Lucilla Lame, MD;  Location: [facility];  Service: Endoscopy;  Laterality: N/A;   Social History:  reports that he quit smoking about 16 months ago. His smoking use included cigarettes. He started smoking about 41 years ago. He has a 20.00 pack-year smoking history. He has never used smokeless tobacco. He reports current alcohol use of about 12.0 standard drinks of alcohol per week. He reports that he does not use drugs.  Family / Support Systems Marital Status: Separated How Long?: Pt has been  verbally seperated for 1 year. No formal paperwork completed. Patient Roles: Parent Spouse/Significant Other: verbally seperated Children: 5 adult children. Anticipated Caregiver: Pt dtr Conseula and her wife Ramona Ability/Limitations of Caregiver: Pt dtr and dtr in law both work during the day Caregiver Availability: Intermittent Family Dynamics: Pt was living alone prior to admission.  Social History Preferred language: English Religion: None Cultural Background: Pt worked as a Geographical information systems officer. Education: high school Read: Yes Write: Yes Employment Status: Retired Date Retired/Disabled/Unemployed: 2021. 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 Age Retired: 75 Legal History/Current Legal Issues: Denies Guardian/Conservator: N/A   Abuse/Neglect Abuse/Neglect Assessment Can Be Completed: Yes Physical Abuse: Denies Verbal Abuse: Denies Sexual Abuse: Denies Exploitation of patient/patient's resources: Denies Self-Neglect: Denies  Emotional Status Pt's affect, behavior and adjustment status: Pt in good spirits at time of visit Recent Psychosocial Issues: Denies Psychiatric History: Denies Substance Abuse History: Denies. Pt admits to drinking 2-12oz beers per day. Pt states he quit smoking cigarettes 1 year ago due to health reasons.  Patient / Family Perceptions, Expectations & Goals Pt/Family understanding of illness & functional limitations: Pt and family have a general understanding of care needs Premorbid pt/family roles/activities: Independent Anticipated changes in roles/activities/participation: Assistance with ADLs/IADLs Pt/family expectations/goals: Pt goal is to get back to walking and being able to take care of himself  [employer]: None Premorbid Home Care/DME Agencies: None Transportation available at discharge: dtr Resource referrals recommended: Neuropsychology  Discharge  Planning Living Arrangements: Children, Other relatives Support Systems: Children, Other relatives Type of Residence: Private residence Insurance Resources: Multimedia programmer (specify) ([place]) Financial Resources: [place] Referred: No Living Expenses: Rent Money Management: Patient Does the patient have any problems obtaining your medications?: No Care Coordinator Barriers to Discharge: Decreased caregiver support, Lack of/limited family support Care Coordinator Anticipated Follow Up Needs: HH/OP  Clinical Impression SW met with pt and pt dtr Conseula in room to introduce self, explain role, and discuss discharge process. Pt requests chaplain services for HCPOA. Pt is not a English as a second language teacher. DME: w/c, RW, and cane. Pt has own w/c with him here in hospital. Pt dtr purchased w/c as private purchase. Pt will need R side amputee pad at d/c.   SW informed pt assigned RN on [facility] request.   Rana Snare 05/19/2020, 4:34 PM

## 2020-05-19 NOTE — Evaluation (Signed)
Occupational Therapy Assessment and Plan  Patient Details  Name: Ryan [name] MRN: [id number] Date of Birth: 07-19-1955  OT Diagnosis: acute pain, swelling of limb and R BKA Rehab Potential: Rehab Potential (ACUTE ONLY): Good ELOS: 5-7 days   Today's Date: 05/19/2020 OT Individual Time: 4098-1191 OT Individual Time Calculation (min): 71 min     Hospital Problem: Principal Problem:   PAD (peripheral artery disease) ([place]) Active Problems:   Below-knee amputation of right lower extremity ([facility])   Essential hypertension   AKI (acute kidney injury) ([place])   Acute blood loss anemia   Postoperative pain   Past Medical History:  Past Medical History:  Diagnosis Date  . CKD (chronic kidney disease)   . History of blood transfusion   . Hyperlipidemia   . Hypertension   . Neuromuscular disorder (H[facility])    numbness feet  . Peripheral vascular disease ([place])   . Shortness of breath dyspnea    occasional    Past Surgical History:  Past Surgical History:  Procedure Laterality Date  . ABDOMINAL AORTOGRAM W/LOWER EXTREMITY N/A 09/13/2019   Procedure: ABDOMINAL AORTOGRAM W/LOWER EXTREMITY;  Surgeon: Waynetta Sandy, MD;  Location: [facility];  Service: Cardiovascular;  Laterality: N/A;  . AMPUTATION Right 05/09/2020   Procedure: RIGHT BELOW KNEE AMPUTATION;  Surgeon: Waynetta Sandy, MD;  Location: [facility];  Service: Vascular;  Laterality: Right;  w/ a block  . COLONOSCOPY WITH PROPOFOL N/A 02/15/2016   Procedure: COLONOSCOPY WITH PROPOFOL;  Surgeon: Lucilla Lame, MD;  Location: [facility];  Service: Endoscopy;  Laterality: N/A;  . FEMORAL-POPLITEAL BYPASS GRAFT Right 09/14/2019   Procedure: BYPASS GRAFT FEMORAL-POPLITEAL ARTERY;  Surgeon: Waynetta Sandy, MD;  Location: [facility];  Service: Vascular;  Laterality: Right;  . HERNIA REPAIR  1999   left inguinal  . INSERTION OF ILIAC STENT Right 09/14/2019   Procedure: Insertion Of Common and External  Iliac Stent;  Surgeon: Waynetta Sandy, MD;  Location: [facility];  Service: Vascular;  Laterality: Right;  . LOWER EXTREMITY ANGIOGRAPHY Right 05/07/2018   Procedure: LOWER EXTREMITY ANGIOGRAPHY;  Surgeon: Algernon Huxley, MD;  Location: [facility];  Service: Cardiovascular;  Laterality: Right;  . LOWER EXTREMITY ANGIOGRAPHY Right 06/25/2018   Procedure: LOWER EXTREMITY ANGIOGRAPHY;  Surgeon: Algernon Huxley, MD;  Location: [facility];  Service: Cardiovascular;  Laterality: Right;  . LOWER EXTREMITY ANGIOGRAPHY Right 07/01/2018   Procedure: LOWER EXTREMITY ANGIOGRAPHY;  Surgeon: Algernon Huxley, MD;  Location: [facility];  Service: Cardiovascular;  Laterality: Right;  . LOWER EXTREMITY ANGIOGRAPHY Right 08/12/2018   Procedure: LOWER EXTREMITY ANGIOGRAPHY;  Surgeon: Algernon Huxley, MD;  Location: [facility];  Service: Cardiovascular;  Laterality: Right;  . LOWER EXTREMITY ANGIOGRAPHY Right 09/03/2018   Procedure: LOWER EXTREMITY ANGIOGRAPHY;  Surgeon: Algernon Huxley, MD;  Location: [facility];  Service: Cardiovascular;  Laterality: Right;  . LOWER EXTREMITY ANGIOGRAPHY Right 09/04/2018   Procedure: Lower Extremity Angiography;  Surgeon: Algernon Huxley, MD;  Location: [facility];  Service: Cardiovascular;  Laterality: Right;  . LOWER EXTREMITY ANGIOGRAPHY Right 10/01/2018   Procedure: LOWER EXTREMITY ANGIOGRAPHY;  Surgeon: Algernon Huxley, MD;  Location: [facility];  Service: Cardiovascular;  Laterality: Right;  . LOWER EXTREMITY ANGIOGRAPHY Right 10/01/2018   Procedure: Lower Extremity Angiography;  Surgeon: Algernon Huxley, MD;  Location: [facility];  Service: Cardiovascular;  Laterality: Right;  . LOWER EXTREMITY ANGIOGRAPHY  Right 10/15/2018   Procedure: LOWER EXTREMITY ANGIOGRAPHY;  Surgeon: Algernon Huxley, MD;  Location: [facility];  Service: Cardiovascular;  Laterality: Right;  . LOWER EXTREMITY ANGIOGRAPHY Left 10/16/2018    Procedure: Lower Extremity Angiography;  Surgeon: Algernon Huxley, MD;  Location: [facility];  Service: Cardiovascular;  Laterality: Left;  . LOWER EXTREMITY ANGIOGRAPHY Left 01/20/2019   Procedure: LOWER EXTREMITY ANGIOGRAPHY;  Surgeon: Algernon Huxley, MD;  Location: [facility];  Service: Cardiovascular;  Laterality: Left;  . LOWER EXTREMITY ANGIOGRAPHY Right 01/21/2019   Procedure: Lower Extremity Angiography;  Surgeon: Algernon Huxley, MD;  Location: [facility];  Service: Cardiovascular;  Laterality: Right;  . LOWER EXTREMITY ANGIOGRAPHY Right 01/28/2019   Procedure: LOWER EXTREMITY ANGIOGRAPHY;  Surgeon: Algernon Huxley, MD;  Location: [facility];  Service: Cardiovascular;  Laterality: Right;  . LOWER EXTREMITY ANGIOGRAPHY Right 01/29/2019   Procedure: Lower Extremity Angiography;  Surgeon: Algernon Huxley, MD;  Location: [facility];  Service: Cardiovascular;  Laterality: Right;  . LOWER EXTREMITY ANGIOGRAPHY Right 04/04/2020   Procedure: LOWER EXTREMITY ANGIOGRAPHY;  Surgeon: Waynetta Sandy, MD;  Location: [facility];  Service: Cardiovascular;  Laterality: Right;  . LOWER EXTREMITY INTERVENTION Right 07/02/2018   Procedure: LOWER EXTREMITY INTERVENTION;  Surgeon: Algernon Huxley, MD;  Location: [facility];  Service: Cardiovascular;  Laterality: Right;  . PERIPHERAL VASCULAR BALLOON ANGIOPLASTY Left 04/04/2020   Procedure: PERIPHERAL VASCULAR BALLOON ANGIOPLASTY;  Surgeon: Waynetta Sandy, MD;  Location: [facility];  Service: Cardiovascular;  Laterality: Left;  external iliac  . POLYPECTOMY N/A 02/15/2016   Procedure: POLYPECTOMY;  Surgeon: Lucilla Lame, MD;  Location: [facility];  Service: Endoscopy;  Laterality: N/A;    Assessment & Plan Clinical Impression: Ryan [name] is a 64 year old male with history of CKD, neuropathy,  COPD w/ DOE, Anemia,  PAD s/p multiple revascularization procedure with attempts at  limb salvage but with progressive RLE wounds with ischemia and claudication.  He was admitted on 05/09/20 for R-BKA by Dr. [name]. Post op with ABLA which was being monitored. Acute on chronic renal failure was resolving and he continues to have oozing from incision line. Therapy ongoing and CIR recommended due to functional decline in mobility and ADLs..  Patient transferred to CIR on 05/17/2020 .    Patient currently requires min with basic self-care skills secondary to muscle weakness and decreased standing balance and decreased balance strategies.  Prior to hospitalization, patient could complete ADLs with modified independent .  Patient will benefit from skilled intervention to increase independence with basic self-care skills and increase level of independence with iADL prior to discharge home with care partner.  Anticipate patient will require intermittent supervision and follow up home health.  OT - End of Session Activity Tolerance: Tolerates 30+ min activity without fatigue Endurance Deficit: Yes Endurance Deficit Description: generalized weakness OT Assessment Rehab Potential (ACUTE ONLY): Good OT Patient demonstrates impairments in the following area(s): Balance;Edema;Endurance;Motor;Pain;Safety;Skin Integrity OT Basic ADL's Functional Problem(s): Bathing;Dressing;Toileting OT Transfers Functional Problem(s): Toilet;Tub/Shower OT Additional Impairment(s): None OT Plan OT Intensity: Minimum of 1-2 x/day, 45 to 90 minutes OT Frequency: 5 out of 7 days OT Duration/Estimated Length of Stay: 5-7 days OT Treatment/Interventions: Balance/vestibular training;DME/adaptive equipment instruction;Patient/family education;Therapeutic Activities;Wheelchair propulsion/positioning;Therapeutic Exercise;Psychosocial support;Functional mobility training;Community reintegration;Self Care/advanced ADL retraining;UE/LE Strength taining/ROM;UE/LE Coordination activities;Discharge planning;Disease  mangement/prevention;Neuromuscular re-education;Pain management OT Self Feeding Anticipated Outcome(s): no goal set OT Basic  Self-Care Anticipated Outcome(s): mod I OT Toileting Anticipated Outcome(s): mod I OT Bathroom Transfers Anticipated Outcome(s): mod I OT Recommendation Patient destination: Home Follow Up Recommendations: Home health OT Equipment Recommended: Tub/shower bench   OT Evaluation Precautions/Restrictions  Precautions Precautions: Fall Precaution Comments: Drainage at R residual limb surgical wound Restrictions Weight Bearing Restrictions: Yes RLE Weight Bearing: Non weight bearing General Chart Reviewed: Yes Family/Caregiver Present: No   Pain Pain Assessment Pain Scale: 0-10 Pain Score: 8  Pain Type: Phantom pain Pain Location: Leg Pain Orientation: Right Pain Descriptors / Indicators: Burning Pain Onset: With Activity (with ace wrap) Patients Stated Pain Goal: 4 Pain Intervention(s): Rest;Massage Multiple Pain Sites: No Home Living/Prior Functioning Home Living Family/patient expects to be discharged to:: Private residence Living Arrangements: Children Available Help at Discharge: Available PRN/intermittently, Family Type of Home: House Home Access: Stairs to enter Technical brewer of Steps: 1 Entrance Stairs-Rails: None Home Layout: One level Bathroom Shower/Tub: Chiropodist: Standard Bathroom Accessibility: Yes Additional Comments: plans to get a tub bench  Lives With: Daughter IADL History Homemaking Responsibilities: Yes Meal Prep Responsibility: Secondary Laundry Responsibility: Secondary Cleaning Responsibility: Secondary Bill Paying/Finance Responsibility: Secondary Shopping Responsibility: Secondary Current License: Yes Mode of Transportation: Car Occupation: Part time employment Type of Occupation: States he will no longer work now Prior Function Level of Independence: Independent with basic ADLs,  Independent with gait, Independent with homemaking with ambulation, Independent with transfers  Able to Take Stairs?: Yes Driving: Yes Vocation: Part time employment Comments: Reports independent IADLs, ADLS, driving, able to ambulate short community distances Vision Baseline Vision/History: No visual deficits Patient Visual Report: No change from baseline Vision Assessment?: No apparent visual deficits Perception  Perception: Within Functional Limits Praxis Praxis: Intact Cognition Overall Cognitive Status: Within Functional Limits for tasks assessed Arousal/Alertness: Awake/alert Orientation Level: Situation;Place;Person Person: Oriented Place: Oriented Situation: Oriented Year: 2021 Month: November Day of Week: Correct Memory: Appears intact Immediate Memory Recall: Sock;Blue;Bed Memory Recall Sock: Without Cue Memory Recall Blue: Without Cue Memory Recall Bed: With Cue Attention: Selective Awareness: Appears intact Problem Solving: Appears intact Safety/Judgment: Appears intact Sensation Sensation Light Touch: Appears Intact Coordination Gross Motor Movements are Fluid and Coordinated: No Fine Motor Movements are Fluid and Coordinated: Yes Coordination and Movement Description: R BKA, limited by denconditioning and pain Motor  Motor Motor: Within Functional Limits  Trunk/Postural Assessment  Cervical Assessment Cervical Assessment: Within Functional Limits Thoracic Assessment Thoracic Assessment: Within Functional Limits Lumbar Assessment Lumbar Assessment: Within Functional Limits Postural Control Postural Control: Deficits on evaluation Righting Reactions: delayed  Balance Balance Balance Assessed: Yes Static Sitting Balance Static Sitting - Balance Support: No upper extremity supported Static Sitting - Level of Assistance: 5: Stand by assistance Dynamic Sitting Balance Dynamic Sitting - Balance Support: Bilateral upper extremity supported Dynamic  Sitting - Level of Assistance: 5: Stand by assistance Dynamic Sitting - Balance Activities: Lateral lean/weight shifting;Forward lean/weight shifting Static Standing Balance Static Standing - Balance Support: Bilateral upper extremity supported Static Standing - Level of Assistance: 5: Stand by assistance Dynamic Standing Balance Dynamic Standing - Balance Support: Bilateral upper extremity supported Dynamic Standing - Level of Assistance: 4: Min assist Extremity/Trunk Assessment RUE Assessment RUE Assessment: Within Functional Limits LUE Assessment LUE Assessment: Within Functional Limits  Care Tool Care Tool Self Care Eating   Eating Assist Level: Independent    Oral Care    Oral Care Assist Level: Set up assist    Bathing   Body parts bathed by patient: Right arm;Left arm;Abdomen;Chest;Front perineal  area;Buttocks;Right upper leg;Left upper leg;Left lower leg;Face   Body parts n/a: Right lower leg Assist Level: Contact Guard/Touching assist    Upper Body Dressing(including orthotics)   What is the patient wearing?: Pull over shirt   Assist Level: Independent    Lower Body Dressing (excluding footwear)   What is the patient wearing?: Underwear/pull up;Pants Assist for lower body dressing: Minimal Assistance - Patient > 75%    Putting on/Taking off footwear   What is the patient wearing?: Non-skid slipper socks Assist for footwear: Supervision/Verbal cueing       Care Tool Toileting Toileting activity   Assist for toileting: Contact Guard/Touching assist Assistive Device Comment: urinal   Care Tool Bed Mobility Roll left and right activity   Roll left and right assist level: Supervision/Verbal cueing    Sit to lying activity   Sit to lying assist level: Supervision/Verbal cueing    Lying to sitting edge of bed activity   Lying to sitting edge of bed assist level: Supervision/Verbal cueing     Care Tool Transfers Sit to stand transfer   Sit to stand assist  level: Contact Guard/Touching assist    Chair/bed transfer   Chair/bed transfer assist level: Contact Guard/Touching assist     Toilet transfer   Assist Level: Contact Guard/Touching assist     Care Tool Cognition Expression of Ideas and Wants Expression of Ideas and Wants: Without difficulty (complex and basic) - expresses complex messages without difficulty and with speech that is clear and easy to understand   Understanding Verbal and Non-Verbal Content Understanding Verbal and Non-Verbal Content: Understands (complex and basic) - clear comprehension without cues or repetitions   Memory/Recall Ability *first 3 days only Memory/Recall Ability *first 3 days only: Current season;Location of own room;That he or she is in a hospital/hospital unit;Staff names and faces    Refer to Care Plan for [place] 1 OT Short Term Goal 1 (Week 1): STG= LTG d/t ELOS  Recommendations for other services: None    Skilled OT Evaluation: Edu provided re OT POC, ELOS, goals, and prosthetic prep/limb care. Pt's R residual limb was re-wrapped to ensure extension at the knee and proper edema control, this caused a lot of increased phantom pain. Cues provided for increased tactile input, rubbing, and distraction for pain relief. Pt completed ADLs at sink level as described above and below. Pt completed toileting with CGA overall. Pt propelled w/c to the therapy gym where he completed tub transfer with TTB with CGA overall. Mirror therapy initiated and pt slightly finding relief. Pt transitioned to prone on mat for positioning of RLE. Pt returned to his room and was left supine with all needs met, bed alarm set.   Skilled Therapeutic Intervention ADL ADL Eating: Modified independent Where Assessed-Eating: Edge of bed Grooming: Setup Where Assessed-Grooming: Sitting at sink Upper Body Bathing: Supervision/safety Where Assessed-Upper Body Bathing: Sitting at sink Lower Body  Bathing: Minimal assistance Where Assessed-Lower Body Bathing: Sitting at sink;Standing at sink Upper Body Dressing: Setup Where Assessed-Upper Body Dressing: Sitting at sink Lower Body Dressing: Contact guard Where Assessed-Lower Body Dressing: Sitting at sink;Standing at sink Toileting: Contact guard Where Assessed-Toileting: Bedside Commode Toilet Transfer: Contact guard Toilet Transfer Method: Counselling psychologist: Bedside commode;Grab bars Tub/Shower Transfer: Metallurgist Method: Ambulating Tub/Shower Equipment: Transfer tub bench Mobility  Bed Mobility Bed Mobility: Sitting - Scoot to [employer] of Bed;Sit to Supine;Scooting to [employer] Sitting - Scoot to [place] of Bed:  Supervision/Verbal cueing Sit to Supine: Supervision/Verbal cueing Scooting to HOB: Supervision/Verbal Cueing Transfers Sit to Stand: Contact Guard/Touching assist Stand to Sit: Contact Guard/Touching assist   Discharge Criteria: Patient will be discharged from OT if patient refuses treatment 3 consecutive times without medical reason, if treatment goals not met, if there is a change in medical status, if patient makes no progress towards goals or if patient is discharged from hospital.  The above assessment, treatment plan, treatment alternatives and goals were discussed and mutually agreed upon: by patient  Hester Sites 05/19/2020, 11:02 AM

## 2020-05-19 NOTE — Progress Notes (Signed)
[facility] PHYSICAL MEDICINE & REHABILITATION PROGRESS NOTE  Subjective/Complaints: Patient seen sitting up in bed this morning.  He states he slept well overnight.  He states he is ready to begin therapies.  ROS: + Constipation, phantom limb pain.  Denies CP, SOB, N/V/D  Objective: Vital Signs: Blood pressure 117/87, pulse 77, temperature 97.9 F (36.6 C), temperature source Oral, resp. rate 16, height 5\' 8"  (1.727 m), weight 61.7 kg, SpO2 98 %. No results found. Recent Labs    05/18/20 0400  WBC 7.4  HGB 10.0*  HCT 30.7*  PLT 424*   Recent Labs    05/18/20 0400  NA 135  K 4.8  CL 99  CO2 23  GLUCOSE 100*  BUN 37*  CREATININE 1.92*  CALCIUM 9.2    Intake/Output Summary (Last 24 hours) at 05/19/2020 1138 Last data filed at 05/19/2020 0900 Gross per 24 hour  Intake 730 ml  Output 1625 ml  Net -895 ml        Physical Exam: BP 117/87 (BP Location: Left Arm)   Pulse 77   Temp 97.9 F (36.6 C) (Oral)   Resp 16   Ht 5\' 8"  (1.727 m)   Wt 61.7 kg   SpO2 98%   BMI 20.68 kg/m  Constitutional: No distress . Vital signs reviewed. HENT: Normocephalic.  Atraumatic. Eyes: EOMI. No discharge. Cardiovascular: No JVD.  RRR. Respiratory: Normal effort.  No stridor.  Bilateral clear to auscultation. GI: Distended.  BS +. Skin: Warm and dry.  Right BKA with dressing CDI. Psych: Normal mood.  Normal behavior. Musc: Right BKA with edema and tenderness. Neuro:  Alert Motor: Bilateral upper extremities, left lower extremity: 5/5 proximal distal Right lower extremity: Hip flexion, knee extension 3 -/5 (pain inhibition)  Assessment/Plan: 1. Functional deficits which require 3+ hours per day of interdisciplinary therapy in a comprehensive inpatient rehab setting.  Physiatrist is providing close team supervision and 24 hour management of active medical problems listed below.  Physiatrist and rehab team continue to assess barriers to discharge/monitor patient progress  toward functional and medical goals   Care Tool:  Bathing    Body parts bathed by patient: Right arm, Left arm, Abdomen, Chest, Front perineal area, Buttocks, Right upper leg, Left upper leg, Left lower leg, Face     Body parts n/a: Right lower leg   Bathing assist Assist Level: Contact Guard/Touching assist     Upper Body Dressing/Undressing Upper body dressing   What is the patient wearing?: Pull over shirt    Upper body assist Assist Level: Independent    Lower Body Dressing/Undressing Lower body dressing      What is the patient wearing?: Underwear/pull up, Pants     Lower body assist Assist for lower body dressing: Minimal Assistance - Patient > 75%     Toileting Toileting    Toileting assist Assist for toileting: Contact Guard/Touching assist Assistive Device Comment: urinal   Transfers Chair/bed transfer  Transfers assist     Chair/bed transfer assist level: Contact Guard/Touching assist     Locomotion Ambulation   Ambulation assist      Assist level: Minimal Assistance - Patient > 75% Assistive device: Walker-rolling Max distance: 33ft   Walk 10 feet activity   Assist     Assist level: Minimal Assistance - Patient > 75%     Walk 50 feet activity   Assist    Assist level: Minimal Assistance - Patient > 75%      Walk 150 feet activity  Assist Walk 150 feet activity did not occur: Safety/medical concerns    Assistive device: Walker-rolling    Walk 10 feet on uneven surface  activity   Assist     Assist level: Minimal Assistance - Patient > 75% Assistive device: Aeronautical engineer Will patient use wheelchair at discharge?: Yes Type of Wheelchair: Manual    Wheelchair assist level: Supervision/Verbal cueing Max wheelchair distance: 24ft    Wheelchair 50 feet with 2 turns activity    Assist        Assist Level: Supervision/Verbal cueing   Wheelchair 150 feet activity      Assist      Assist Level: Supervision/Verbal cueing    Medical Problem List and Plan: 1.  Impaired mobility and ADLs secondary to right BKA  Begin CIR evaluations 2.  Antithrombotics: -DVT/anticoagulation:  Pharmaceutical: Lovenox             -antiplatelet therapy: on ASA 3. Pain Management: has been using oxycodone 10 mg every 4 hours.   Gabapentin 100 3 times daily started on 11/26  Monitor with increased exertion 4. Mood: LCSW to follow for evaluation and support.              -antipsychotic agents: N/a 5. Neuropsych: This patient is capable of making decisions on his own behalf. 6. Skin/Wound Care: Monitor wound drainage/healing.  Added multivitamin, Vitamin C and protein supplement to promote healing.  7. Fluids/Electrolytes/Nutrition: Monitor I/Os. Discontinued IVF.  8. ABLA:   Hemoglobin 10.0 on 11/25  Continue to monitor 9. Acute on chronic renal failure (CKD3):   Creatinine 1.92 on 11/25, labs pending  Encourage fluids 10. HTN: Monitor BP --continue Avapro and amlodipine-valsartan 5-160mg .   Monitor with increased mobility 11. COPD: Respiratory status stable on Breo and Incruse. Currently satting well on room air.   LOS: 2 days A FACE TO FACE EVALUATION WAS PERFORMED  Ryan [name] Ryan [name] 05/19/2020, 11:38 AM

## 2020-05-19 NOTE — Progress Notes (Signed)
Occupational Therapy Session Note  Patient Details  Name: Ryan [name] MRN: [id number] Date of Birth: [birth year]  Today's Date: 05/19/2020 OT Individual Time: 1400-1500 OT Individual Time Calculation (min): 60 min   Short Term Goals: Week 1:  OT Short Term Goal 1 (Week 1): STG= LTG d/t ELOS  Skilled Therapeutic Interventions/Progress Updates:    Pt greeted semi-reclined in bed and agreeable to OT treatment session. Pt completed bed mobility w/ supervision. CGA stand-pivot transfer to wc. Worked on [employer] and educated on donning leg rests. Pt propelled wc to therapy gym with supervision. Pivot to therapy mat with CGA. OT re-wrapped R residual limb and educated on importance of having wrap tight for edema management. Educated on rubbing leg to decrease sensitity as pt exerpeicing 7/10 pain. UB there-ex three sets of 10 bicep curls, chest press, and straight arm raises using 3 lb dowel rod. Seated beach ball voley using 3 lb dowel rod as well. Pt brought into prone and completed 3 sets of 10 leg hip extension,  hip abduction, and glute squeezes. Pt propelled wc back to room and pivoted back to bed CGA.  Therapy Documentation Precautions:  Precautions Precautions: Fall Precaution Comments: Drainage at R residual limb surgical wound Restrictions Weight Bearing Restrictions: Yes RLE Weight Bearing: Non weight bearing Pain: 7/10, burning pain to R residual limb   Therapy/Group: Individual Therapy  Valma Cava 05/19/2020, 2:33 PM

## 2020-05-20 ENCOUNTER — Inpatient Hospital Stay (HOSPITAL_COMMUNITY): Payer: 59

## 2020-05-20 MED ORDER — SODIUM CHLORIDE 0.9 % IV SOLN
900.0000 mL | Freq: Every day | INTRAVENOUS | Status: DC
  Administered 2020-05-20: 900 mL via INTRAVENOUS

## 2020-05-20 NOTE — Progress Notes (Signed)
[facility] PHYSICAL MEDICINE & REHABILITATION PROGRESS NOTE  Subjective/Complaints: Patient seen sitting up in bed this morning.  States he slept well overnight.  He states he had a good first day of therapies yesterday, notes reviewed-confirmed.  He requests a stump protector.  He notes improvement with medications for phantom limb pain.  ROS: Denies CP, SOB, N/V/D  Objective: Vital Signs: Blood pressure (!) 108/97, pulse 65, temperature 97.6 F (36.4 C), resp. rate 18, height 5\' 8"  (1.727 m), weight 61.7 kg, SpO2 96 %. No results found. Recent Labs    05/18/20 0400  WBC 7.4  HGB 10.0*  HCT 30.7*  PLT 424*   Recent Labs    05/18/20 0400 05/19/20 1208  NA 135 134*  K 4.8 4.7  CL 99 101  CO2 23 22  GLUCOSE 100* 98  BUN 37* 42*  CREATININE 1.92* 2.09*  CALCIUM 9.2 8.8*    Intake/Output Summary (Last 24 hours) at 05/20/2020 1245 Last data filed at 05/20/2020 0815 Gross per 24 hour  Intake 720 ml  Output 2825 ml  Net -2105 ml        Physical Exam: BP (!) 108/97 (BP Location: Left Arm)   Pulse 65   Temp 97.6 F (36.4 C)   Resp 18   Ht 5\' 8"  (1.727 m)   Wt 61.7 kg   SpO2 96%   BMI 20.68 kg/m  Constitutional: No distress . Vital signs reviewed. HENT: Normocephalic.  Atraumatic. Eyes: EOMI. No discharge. Cardiovascular: No JVD.  RRR. Respiratory: Normal effort.  No stridor.  Bilateral clear to auscultation. GI: Distended, improving.  BS +. Skin: Warm and dry.  Right BKA with dressing CDI. Psych: Normal mood.  Normal behavior. Musc: Right BKA with edema and tenderness, improving Neuro:  Alert Motor: Bilateral upper extremities, left lower extremity: 5/5 proximal distal Right lower extremity: Hip flexion, knee extension 3 -/5 (pain inhibition), improving  Assessment/Plan: 1. Functional deficits which require 3+ hours per day of interdisciplinary therapy in a comprehensive inpatient rehab setting.  Physiatrist is providing close team supervision and 24  hour management of active medical problems listed below.  Physiatrist and rehab team continue to assess barriers to discharge/monitor patient progress toward functional and medical goals   Care Tool:  Bathing    Body parts bathed by patient: Right arm, Left arm, Abdomen, Chest, Front perineal area, Buttocks, Right upper leg, Left upper leg, Left lower leg, Face     Body parts n/a: Right lower leg   Bathing assist Assist Level: Contact Guard/Touching assist     Upper Body Dressing/Undressing Upper body dressing   What is the patient wearing?: Pull over shirt    Upper body assist Assist Level: Independent    Lower Body Dressing/Undressing Lower body dressing      What is the patient wearing?: Underwear/pull up     Lower body assist Assist for lower body dressing: Minimal Assistance - Patient > 75%     Toileting Toileting    Toileting assist Assist for toileting: Independent with assistive device Assistive Device Comment: urinal   Transfers Chair/bed transfer  Transfers assist     Chair/bed transfer assist level: Contact Guard/Touching assist     Locomotion Ambulation   Ambulation assist      Assist level: Minimal Assistance - Patient > 75% Assistive device: Walker-rolling Max distance: 49ft   Walk 10 feet activity   Assist     Assist level: Minimal Assistance - Patient > 75%     Walk 50  feet activity   Assist    Assist level: Minimal Assistance - Patient > 75%      Walk 150 feet activity   Assist Walk 150 feet activity did not occur: Safety/medical concerns    Assistive device: Walker-rolling    Walk 10 feet on uneven surface  activity   Assist     Assist level: Minimal Assistance - Patient > 75% Assistive device: Aeronautical engineer Will patient use wheelchair at discharge?: Yes Type of Wheelchair: Manual    Wheelchair assist level: Supervision/Verbal cueing Max wheelchair distance: 226ft     Wheelchair 50 feet with 2 turns activity    Assist        Assist Level: Supervision/Verbal cueing   Wheelchair 150 feet activity     Assist      Assist Level: Supervision/Verbal cueing    Medical Problem List and Plan: 1.  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  Antithrombotics: -DVT/anticoagulation:  Pharmaceutical: Lovenox             -antiplatelet therapy: on ASA 3. Pain Management: has been using oxycodone 10 mg every 4 hours.   Gabapentin 100 3 times daily started on 11/26  Controlled with meds on 11/27  Monitor with increased exertion 4. Mood: LCSW to follow for evaluation and support.              -antipsychotic agents: N/a 5. Neuropsych: This patient is capable of making decisions on his own behalf. 6. Skin/Wound Care: Monitor wound drainage/healing.  Added multivitamin, Vitamin C and protein supplement to promote healing.  7. Fluids/Electrolytes/Nutrition: Monitor I/Os. Discontinued IVF.  8. ABLA:   Hemoglobin 10.0 on 11/25  Continue to monitor 9. Acute on chronic renal failure (CKD3):   Creatinine 2.09 on 11/26, labs ordered for Monday  No echo on file, no history of CAD-IVF x3 nights ordered  Encourage fluids 10. HTN: Monitor BP --continue Avapro and amlodipine-valsartan 5-160mg .   Controlled/soft on 11/27  Monitor with increased mobility 11. COPD: Respiratory status stable on Breo and Incruse. Currently satting well on room air.   LOS: 3 days A FACE TO FACE EVALUATION WAS PERFORMED  Ryan [name] Ryan [name] 05/20/2020, 12:45 PM

## 2020-05-20 NOTE — Progress Notes (Signed)
Physical Therapy Session Note  Patient Details  Name: Ryan [name] MRN: [id number] Date of Birth: 07-18-1955  Today's Date: 05/20/2020 PT Individual Time: 0800-0900 PT Individual Time Calculation (min): 60 min   Short Term Goals: Week 1:  PT Short Term Goal 1 (Week 1): STGs=LTGs  Skilled Therapeutic Interventions/Progress Updates:     Patient in bed upon PT arrival. Patient alert and agreeable to PT session. Patient reported 6/10 phantom limb pain at his R foot and ankle during session, RN provided pain medicine prior to session. PT provided repositioning, rest breaks, and distraction as pain interventions throughout session.   Removed ACE wrap and dressing from R residual limb. Noted sanguinous drainage from anterior segment of his incision with minor separation at the incision. Bleeding did not stop with >10 min of prolonged pressure, RN made aware. Educated on phantom pain, desensitization, signs/symptoms of infections, daily inspection of residual limb and well limb, and skin care (applied lotion to proximal residual limb away from incision, and well limb), while applying pressure to patient's incision. Per RN, applied 1 nonadherent dressing, 1 roll of Kirlex, and 1-4" ACE wrap.   Therapeutic Activity: Bed Mobility: Patient performed supine to/from sit with mod I with HOB slightly elevated performing supine to long sitting.  Transfers: Patient performed stand pivot bed>w/c, w/c<>toilet, and w/c>w/c with CGA-min A using arm rest and/or grab bar. Provided verbal cues for hand placement and weight shift forward and L for improved balance in standing. Patient was continent of bladder during toileting and performed lower body clothing management with mod A due to decreased balance.   Wheelchair Mobility:  Patient propelled wheelchair >200 feet x2 with supervision. Provided verbal cues for turning technique. Provided w/c gloves for improved grip and hand protection with w/c mobility.  Patient's w/c was bought by his daughter for community mobility after a previous surgery. W/c noted to be too wide for the patient and without anti-tippers. Educated on risk of overuse injuries to patient's shoulders due to wider w/c and risk of falling backwards without anti-tippers in place. Provided patient with a 16"x18" light weight w/c with anit-tippers and transferred R limb pad to the chair. Patient with improved speed and mobility with smaller chair on second trial of w/c mobility.  Provided patient with a hand-held mirror during session for self inspection of residual and well limb. Reviewed how to use this device and importance of notifying MD if changes to skin or signs/symptoms of infection present. Patient stated understanding.   Patient in w/c in the room  at end of session with breaks locked, seat belt alarm set, and all needs within reach.   Therapy Documentation Precautions:  Precautions Precautions: Fall Precaution Comments: Drainage at R residual limb surgical wound Restrictions Weight Bearing Restrictions: Yes RLE Weight Bearing: Non weight bearing   Therapy/Group: Individual Therapy  Marques Ericson L Thomasine Klutts PT, DPT  05/20/2020, 12:31 PM

## 2020-05-20 NOTE — Progress Notes (Signed)
Occupational Therapy Session Note  Patient Details  Name: Ryan [name] MRN: [id number] Date of Birth: 08-03-1955  Today's Date: 05/20/2020 OT Individual Time: 6286-3817 OT Individual Time Calculation (min): 72 min    Short Term Goals: Week 1:  OT Short Term Goal 1 (Week 1): STG= LTG d/t ELOS  Skilled Therapeutic Interventions/Progress Updates:    1;1.pt received in bed agreeable to OT and shower. OT applies occlusive to RLE to keep incision on residual limb dry and pt completes all transfers (ambaultory or squat pivot) with supervision-CGA to get onto toilet and TTB in walk in shower with VC for hand placement to improve power up. Pt competes toileting with S for standing balance during clothing management and bathing at seated level with VC for lateral lean use to wash buttocks. Pt able to doff all clothing seated on BSc or TTB and don all clothing with S-S/U at EOB using bed rail after "wiggling" to pull pants up 90% and last 10% standing with bed rail. Reviewed laying in prone to stretch out hip flexors and positioning for hamstring length. Pt completes ambulatory IADL tasks with walker bag (kitchen search and laundry) with VC for proximity to objects and using counter or washing machine to steady self when reaching low. Exited session with pt seated in bed, exit alarm on and call light in reach   Therapy Documentation Precautions:  Precautions Precautions: Fall Precaution Comments: Drainage at R residual limb surgical wound Restrictions Weight Bearing Restrictions: Yes RLE Weight Bearing: Non weight bearing General:   Vital Signs: Therapy Vitals Temp: 97.6 F (36.4 C) Pulse Rate: 65 Resp: 18 BP: (!) 108/97 Patient Position (if appropriate): Lying Oxygen Therapy SpO2: 96 % O2 Device: Room Air Pain: Pain Assessment Pain Scale: 0-10 Pain Score: Asleep Pain Location: Leg Pain Orientation: Right Pain Intervention(s): Medication (See eMAR) ADL: ADL Eating: Modified  independent Where Assessed-Eating: Edge of bed Grooming: Setup Where Assessed-Grooming: Sitting at sink Upper Body Bathing: Supervision/safety Where Assessed-Upper Body Bathing: Sitting at sink Lower Body Bathing: Minimal assistance Where Assessed-Lower Body Bathing: Sitting at sink, Standing at sink Upper Body Dressing: Setup Where Assessed-Upper Body Dressing: Sitting at sink Lower Body Dressing: Contact guard Where Assessed-Lower Body Dressing: Sitting at sink, Standing at sink Toileting: Contact guard Where Assessed-Toileting: Bedside Commode Toilet Transfer: Therapist, music Method: Counselling psychologist: Bedside commode, Grab bars Tub/Shower Transfer: Metallurgist Method: Optometrist: Nurse, learning disability    Praxis   Exercises:   Other Treatments:     Therapy/Group: Individual Therapy  Tonny Branch 05/20/2020, 6:59 AM

## 2020-05-20 NOTE — Progress Notes (Signed)
Physical Therapy Session Note  Patient Details  Name: Ryan [name] MRN: [id number] Date of Birth: February 23, 1956  Today's Date: 05/20/2020 PT Individual Time: 1450-1530 PT Individual Time Calculation (min): 40 min   Short Term Goals: Week 1:  PT Short Term Goal 1 (Week 1): STGs=LTGs  Skilled Therapeutic Interventions/Progress Updates:     Patient in bed upon PT arrival. Patient alert and agreeable to PT session. Patient denied pain during session.  Therapeutic Activity: Bed Mobility: Patient performed supine to/from sit with mod I with use of hospital bed features.  Transfers: Patient performed sit to/from stand x1 using RW and stand pviot bed<>w/c and w/c<>toilet with CGA-close supervision using arm rests and/or grab bar. Provided verbal cues for hand placement and controlled descent. Patient was continent of bowl and bladder during toileting. Performed peri-care independently and lower body clothing management with supervision and CGA for standing balance. Provided cues for relaxation and diaphragmatic breathing during toileted to reduce [place] maneuver.   Gait Training:  Patient ambulated 67 feet using RW with CGA-close supervision with w/c follow due to decreased activity tolerance. Ambulated with hop-to gait pattern on L with good use of upper extremities to control heel strike and decreased L foot clearance. Provided verbal cues for shoulder depression for increased back muscular support with increased upper extremity use and increased foot clearance for safety.  Wheelchair Mobility:  Patient propelled wheelchair >150 feet x2 with supervision-mod I. Provided verbal cues for use of w/c gloves, management of leg rest and amputee pad, use of breaks, and set up for transfers throughout session.    Therapeutic Exercise: Patient performed the following exercises with verbal and tactile cues for proper technique. -seated R LAQ x10 with 3 sec hold -R quad sets x10 with 5 sec hold -R  SLR x10 with 2 sec hold  Patient in bed at end of session with breaks locked, bed alarm set, and all needs within reach.    Therapy Documentation Precautions:  Precautions Precautions: Fall Precaution Comments: Drainage at R residual limb surgical wound Restrictions Weight Bearing Restrictions: Yes RLE Weight Bearing: Non weight bearing   Therapy/Group: Individual Therapy  Mckenna Gamm L Dino Borntreger PT, DPT  05/20/2020, 4:31 PM

## 2020-05-20 NOTE — Progress Notes (Signed)
Orthopedic Tech Progress Note Patient Details:  Ryan [name] 28-Oct-1955 [id number] Called order into [facility] Patient ID: Ryan [name], male   DOB: Jan 17, 1956, 64 y.o.   MRN: [id number]   [name] 05/20/2020, 2:35 PM

## 2020-05-21 ENCOUNTER — Inpatient Hospital Stay (HOSPITAL_COMMUNITY): Payer: 59

## 2020-05-21 MED ORDER — SODIUM CHLORIDE 0.9 % IV SOLN
900.0000 mL | Freq: Every day | INTRAVENOUS | Status: AC
  Administered 2020-05-21 – 2020-05-22 (×2): 900 mL via INTRAVENOUS

## 2020-05-21 NOTE — Progress Notes (Signed)
During AM rounds, MD Posey Pronto ordered to keep off shrinker and apply ABD pad, wrap with kerlex and compression wrap.Dressing was applied .

## 2020-05-21 NOTE — Progress Notes (Signed)
[facility] PHYSICAL MEDICINE & REHABILITATION PROGRESS NOTE  Subjective/Complaints: Patient seen sitting up in bed this morning.  He states he slept well overnight.  He has questions regarding dressing.  ROS: Denies CP, SOB, N/V/D  Objective: Vital Signs: Blood pressure 119/81, pulse 79, temperature 98.1 F (36.7 C), temperature source Oral, resp. rate 16, height 5\' 8"  (1.727 m), weight 61.7 kg, SpO2 98 %. No results found. No results for input(s): WBC, HGB, HCT, PLT in the last 72 hours. Recent Labs    05/19/20 1208  NA 134*  K 4.7  CL 101  CO2 22  GLUCOSE 98  BUN 42*  CREATININE 2.09*  CALCIUM 8.8*    Intake/Output Summary (Last 24 hours) at 05/21/2020 1159 Last data filed at 05/21/2020 0911 Gross per 24 hour  Intake 1217.84 ml  Output 1710 ml  Net -492.16 ml        Physical Exam: BP 119/81 (BP Location: Left Arm)   Pulse 79   Temp 98.1 F (36.7 C) (Oral)   Resp 16   Ht 5\' 8"  (1.727 m)   Wt 61.7 kg   SpO2 98%   BMI 20.68 kg/m   Constitutional: No distress . Vital signs reviewed. HENT: Normocephalic.  Atraumatic. Eyes: EOMI. No discharge. Cardiovascular: No JVD.  RRR. Respiratory: Normal effort.  No stridor.  Bilateral clear to auscultation. GI: Non-distended.  BS +. Skin: Warm and dry.  Right BKA with central area of moderate amount of serosanguineous drainage-no staple in that area. Psych: Normal mood.  Normal behavior. Musc: Right BKA with edema and tenderness, improving Neuro:  Alert Motor: Bilateral upper extremities, left lower extremity: 5/5 proximal distal Right lower extremity: Hip flexion, knee extension 3/5 (pain inhibition)  Assessment/Plan: 1. Functional deficits which require 3+ hours per day of interdisciplinary therapy in a comprehensive inpatient rehab setting.  Physiatrist is providing close team supervision and 24 hour management of active medical problems listed below.  Physiatrist and rehab team continue to assess barriers to  discharge/monitor patient progress toward functional and medical goals   Care Tool:  Bathing    Body parts bathed by patient: Right arm, Left arm, Abdomen, Chest, Front perineal area, Buttocks, Right upper leg, Left upper leg, Left lower leg, Face     Body parts n/a: Right lower leg   Bathing assist Assist Level: Contact Guard/Touching assist     Upper Body Dressing/Undressing Upper body dressing   What is the patient wearing?: Pull over shirt    Upper body assist Assist Level: Independent    Lower Body Dressing/Undressing Lower body dressing      What is the patient wearing?: Underwear/pull up     Lower body assist Assist for lower body dressing: Minimal Assistance - Patient > 75%     Toileting Toileting    Toileting assist Assist for toileting: Independent with assistive device Assistive Device Comment: urinal   Transfers Chair/bed transfer  Transfers assist     Chair/bed transfer assist level: Contact Guard/Touching assist     Locomotion Ambulation   Ambulation assist      Assist level: Contact Guard/Touching assist Assistive device: Walker-rolling Max distance: 67 ft   Walk 10 feet activity   Assist     Assist level: Contact Guard/Touching assist Assistive device: Walker-rolling   Walk 50 feet activity   Assist    Assist level: Contact Guard/Touching assist Assistive device: Walker-rolling    Walk 150 feet activity   Assist Walk 150 feet activity did not occur: Safety/medical concerns  Assistive device: Walker-rolling    Walk 10 feet on uneven surface  activity   Assist     Assist level: Minimal Assistance - Patient > 75% Assistive device: Aeronautical engineer Will patient use wheelchair at discharge?: Yes Type of Wheelchair: Manual    Wheelchair assist level: Supervision/Verbal cueing, Set up assist Max wheelchair distance: >200 ft    Wheelchair 50 feet with 2 turns  activity    Assist        Assist Level: Supervision/Verbal cueing   Wheelchair 150 feet activity     Assist      Assist Level: Supervision/Verbal cueing    Medical Problem List and Plan: 1.  Impaired mobility and ADLs secondary to right BKA  Continue CIR  Continue stump protector  2.  Antithrombotics: -DVT/anticoagulation:  Pharmaceutical: Lovenox             -antiplatelet therapy: on ASA 3. Pain Management: has been using oxycodone 10 mg every 4 hours.   Gabapentin 100 3 times daily started on 11/26  Controlled with meds on 11/28  Monitor with increased exertion 4. Mood: LCSW to follow for evaluation and support.              -antipsychotic agents: N/a 5. Neuropsych: This patient is capable of making decisions on his own behalf. 6. Skin/Wound Care: Monitor wound drainage/healing.  Added multivitamin, Vitamin C and protein supplement to promote healing.   ABD bed, Kerlix, Ace wrap 7. Fluids/Electrolytes/Nutrition: Monitor I/Os. Discontinued IVF.  8. ABLA:   Hemoglobin 10.0 on 11/25  Continue to monitor 9. Acute on chronic renal failure (CKD3):   Creatinine 2.09 on 11/26, labs ordered for tomorrow  No echo on file, no history of CAD-IVF x 2 more nights   Encourage fluids 10. HTN: Monitor BP --continue Avapro and amlodipine-valsartan 5-160mg .   Controlled on 11/28  Monitor with increased mobility 11. COPD: Respiratory status stable on Breo and Incruse. Currently satting well on room air.   LOS: 4 days A FACE TO FACE EVALUATION WAS PERFORMED  Sumiya Mamaril Lorie Phenix 05/21/2020, 11:59 AM

## 2020-05-21 NOTE — Progress Notes (Signed)
Physical Therapy Session Note  Patient Details  Name: Ryan [name] MRN: [id number] Date of Birth: 07-26-1955  Today's Date: 05/21/2020 PT Individual Time: 1055-1205 PT Individual Time Calculation (min): 70 min   Short Term Goals: Week 1:  PT Short Term Goal 1 (Week 1): STGs=LTGs  Skilled Therapeutic Interventions/Progress Updates:     Patient in w/c with his daughter in the room upon PT arrival. Patient alert and agreeable to PT session. Patient denied pain at rest and reported 3/10 pain with donning ACE wrap and mobility during session.  Therapeutic Activity: Patient's ACE wrap had slid off his dressing. Removed and reapplied ACE wrap educating patient and his daughter on technique and purpose of wrapping. Reviewed steps to progress to using a shrinker and to follow-up with MD about this. Educated on progression to a prosthesis and various adaptations to equipment and prosthesis to enable to patient to be independent and return to all prior activities as able. Reviewed importance of promoting ROM, strategies for pain management, and daily skin inspection with hand help mirror with patient and his daughter during session.  Transfers: Patient performed sit to/from stand from the w/c, toilet, and a standard arm chair with supervision using a RW. Provided verbal cues for completing turn to sit safely x1. Patient was continent of bladder during toileting, performed peri-care and lower body dressing with mod I with supervision for standing balance.  Gait Training:  Patient ambulated 10-15 feet x3 using RW with CGA-close supervision. Ambulated with hop-to gait pattern on L with improved foot clearance today. Provided verbal cues for proximity to RW, as patient lands close to the front of his walker. Patient ascended/descended 1-4" x3 step using RW with CGA-min A for AD manamgnet. Performed hop-to gait pattern on L ascending backwards for increased leverage with upper extremities and down  forwards. Provided cues for technique and sequencing. Reviewed steps for caregiver and patient from exiting the car to entering the home, and vice versa,  with patient's daughter recording for other family members.   Wheelchair Mobility:  Patient propelled wheelchair >150 feet x2 with supervision-mod I. Provided verbal cues for leg rest and break management during session.   Patient in w/c with his daughter in the room at end of session with breaks locked and all needs within reach.    Therapy Documentation Precautions:  Precautions Precautions: Fall Precaution Comments: Drainage at R residual limb surgical wound Restrictions Weight Bearing Restrictions: Yes RLE Weight Bearing: Non weight bearing   Therapy/Group: Individual Therapy  [name] PT, DPT  05/21/2020, 12:52 PM

## 2020-05-21 NOTE — Progress Notes (Signed)
Occupational Therapy Session Note  Patient Details  Name: Ryan [name] MRN: [id number] Date of Birth: 1955-09-24  Today's Date: 05/21/2020 OT Individual Time: 0845-1000 OT Individual Time Calculation (min): 75 min   Session 2: OT Individual Time: 1335-1420 OT Individual Time Calculation (min): 45  min    Short Term Goals: Week 1:  OT Short Term Goal 1 (Week 1): STG= LTG d/t ELOS  Skilled Therapeutic Interventions/Progress Updates:    Pt received supine with c/o soreness in his residual limb but agreeable to OT session focused on shower level ADLs. Noted pt's residual limb was incorrectly wrapped so it was re-wrapped to ensure proper extension at the knee and edema management. Pt completed ambulatory transfer into the bathroom with distant supervision using the RW. His residual limb and IV were occluded. Pt completed bathing seated on TTB with set up assist. His daughter entered room and was present for rest of session and very involved in education. Pt completed all dressing at supervision level with no cueing required for safety. Pt propelled w/c 200 ft to the ADL apt to demonstrate tub transfer with TTB. Pt able to complete with distant supervision. Back in his room pt's daughter requested to record/learn figure 8 wrapping for residual limb. Pt in increased pain following this but he requested only rest for pain management. Pt was left sitting up with all needs met, daughter present.   Session 2: Pt received supine with no c/o pain, agreeable to OT session. Pt transferred from bed to w/c with distant supervision using RW. Pt propelled w/c 250 ft to therapy gym with supervision. Pt used BITS system in standing to challenge functional activity tolerance, reduced UE reliance on RW and standing balance. Tactile and verbal cueing for reducing RLE hip and knee flexion. Pt completed 2x activity for 91mn total of standing before requiring a seated rest break. Trial x2 completed with faster  functional reach to encourage quicker UE weight shift off of RW. Pt reported increase in pain with standing but it subsided with rest break. Pt returned to his room and was left sitting up in the w/c with all needs met.    Therapy Documentation Precautions:  Precautions Precautions: Fall Precaution Comments: Drainage at R residual limb surgical wound Restrictions Weight Bearing Restrictions: Yes RLE Weight Bearing: Non weight bearing  Therapy/Group: Individual Therapy  SCurtis Sites11/28/2021, 7:28 AM

## 2020-05-22 ENCOUNTER — Other Ambulatory Visit: Payer: Self-pay

## 2020-05-22 ENCOUNTER — Inpatient Hospital Stay (HOSPITAL_COMMUNITY): Payer: 59

## 2020-05-22 ENCOUNTER — Inpatient Hospital Stay (HOSPITAL_COMMUNITY): Payer: 59 | Admitting: Occupational Therapy

## 2020-05-22 LAB — BASIC METABOLIC PANEL
Anion gap: 9 (ref 5–15)
BUN: 30 mg/dL — ABNORMAL HIGH (ref 8–23)
CO2: 25 mmol/L (ref 22–32)
Calcium: 9.4 mg/dL (ref 8.9–10.3)
Chloride: 105 mmol/L (ref 98–111)
Creatinine, Ser: 1.53 mg/dL — ABNORMAL HIGH (ref 0.61–1.24)
GFR, Estimated: 51 mL/min — ABNORMAL LOW (ref >60)
Glucose, Bld: 107 mg/dL — ABNORMAL HIGH (ref 70–99)
Potassium: 5.4 mmol/L — ABNORMAL HIGH (ref 3.5–5.1)
Sodium: 139 mmol/L (ref 135–145)

## 2020-05-22 LAB — CBC
HCT: 31.6 % — ABNORMAL LOW (ref 39.0–52.0)
Hemoglobin: 10.1 g/dL — ABNORMAL LOW (ref 13.0–17.0)
MCH: 30.8 pg (ref 26.0–34.0)
MCHC: 32 g/dL (ref 30.0–36.0)
MCV: 96.3 fL (ref 80.0–100.0)
Platelets: 424 10*3/uL — ABNORMAL HIGH (ref 150–400)
RBC: 3.28 MIL/uL — ABNORMAL LOW (ref 4.22–5.81)
RDW: 11.9 % (ref 11.5–15.5)
WBC: 6 10*3/uL (ref 4.0–10.5)
nRBC: 0 % (ref 0.0–0.2)

## 2020-05-22 MED ORDER — SODIUM POLYSTYRENE SULFONATE 15 GM/60ML PO SUSP
15.0000 g | Freq: Once | ORAL | Status: AC
  Administered 2020-05-22: 15 g via ORAL
  Filled 2020-05-22: qty 60

## 2020-05-22 MED ORDER — DULOXETINE HCL 20 MG PO CPEP
20.0000 mg | ORAL_CAPSULE | Freq: Every day | ORAL | Status: DC
  Administered 2020-05-22 – 2020-05-24 (×3): 20 mg via ORAL
  Filled 2020-05-22 (×4): qty 1

## 2020-05-22 NOTE — Progress Notes (Signed)
Occupational Therapy Session Note  Patient Details  Name: Ryan [name] MRN: [id number] Date of Birth: [birth year]  Today's Date: 05/22/2020  Session 1 OT Individual Time: 5702-2026 OT Individual Time Calculation (min): 33 min   Session 2 OT Individual Time: 1331-1430 OT Individual Time Calculation (min): 59 min    Short Term Goals: Week 1:  OT Short Term Goal 1 (Week 1): STG= LTG d/t ELOS  Skilled Therapeutic Interventions/Progress Updates:    Session 1 Pt greeted semi-reclined in bed with nursing administering meds. Patient reported he had just gotten his breakfast tray and wanted to eat prior to therapy. OT returned and pt ready to participate. OT provided pt with hand out on residual limb wrapping and had pt practice wrapping limb using rolled up towel to simulate limb. OT changed dressed and re-wrapped residual limb to provide appropriate compression for edema management. Pt then completed bed mobility mod I and donned his shoe with set-up. Pt ambulated 10 feet in room w/ RW and close supervision. Worked on standing balance/endurance with standing toothbrushing task. OT educated on balance strategies while removing unilateral UE for BADL task. Pt returned to wc and left seated in wc with alarm belt on, call bell in reach, and needs met.   Session 2 Pt greeted semi-reclined in bed and agreeable to OT treatment session. Pt reported need to go to the bathroom. Worked on functional ambulation into bathroom w/ RW and close supervision. Pt needed CGA for balance when turning to sit on commode. Close supervision/CGA when standing to manage clothing-continent void of bladder. Pt propelled wc to day room and worked on standing balance/endurance with standing Wii bowling activity. Pt needed intermittent CGA for dynamic standing balance when swinging R UE through to "bowl." Pt completed 10 minutes on NuStep with 3 rest breaks. Pt returned to room and pivoted back to bed with supervision. Pt left  with bed alarm on, call bell in reach, and needs met.   Therapy Documentation Precautions:  Precautions Precautions: Fall Precaution Comments: Drainage at R residual limb surgical wound Restrictions Weight Bearing Restrictions: Yes RLE Weight Bearing: Non weight bearing Pain:  6/10 pain in R residual limb. Rest and repisitioned for comfort   Therapy/Group: Individual Therapy  Valma Cava 05/22/2020, 2:32 PM

## 2020-05-22 NOTE — Progress Notes (Signed)
Patient ID: Ryan [name], male   DOB: [birth year], 64 y.o.   MRN: [id number]  SW met with pt in room to discuss DME recs, and pt called his dtr [name]. Pt reports that he would just like to have parts for his current wheelchair. SW informed pt there will be further updates on d/c recs.   SW ordered pt DME: anti-tippers, R amputee pad, TTB, and 3in1 BSC.   Loralee Pacas, MSW, [place] Office: [phone] Cell: [phone] Fax: [phone]

## 2020-05-22 NOTE — Progress Notes (Addendum)
[facility] PHYSICAL MEDICINE & REHABILITATION PROGRESS NOTE  Subjective/Complaints: Very pleasant Complains of phantom limb pain K+ up to 5.4 Cr down to 1.53  ROS: Denies CP, SOB, N/V/D  Objective: Vital Signs: Blood pressure 140/84, pulse 67, temperature (!) 97.5 F (36.4 C), temperature source Oral, resp. rate 18, height 5\' 8"  (1.727 m), weight 61.7 kg, SpO2 96 %. No results found. Recent Labs    05/22/20 0708  WBC 6.0  HGB 10.1*  HCT 31.6*  PLT 424*   Recent Labs    05/19/20 1208 05/22/20 0708  NA 134* 139  K 4.7 5.4*  CL 101 105  CO2 22 25  GLUCOSE 98 107*  BUN 42* 30*  CREATININE 2.09* 1.53*  CALCIUM 8.8* 9.4    Intake/Output Summary (Last 24 hours) at 05/22/2020 1140 Last data filed at 05/22/2020 0930 Gross per 24 hour  Intake 1859.4 ml  Output 3625 ml  Net -1765.6 ml        Physical Exam: BP 140/84   Pulse 67   Temp (!) 97.5 F (36.4 C) (Oral)   Resp 18   Ht 5\' 8"  (1.727 m)   Wt 61.7 kg   SpO2 96%   BMI 20.68 kg/m   Gen: no distress, normal appearing HEENT: oral mucosa pink and moist, NCAT Cardio: Reg rate Chest: normal effort, normal rate of breathing Abd: soft, non-distended Ext: no edema Skin: Warm and dry.  Right BKA with central area of moderate amount of serosanguineous drainage-no staple in that area. Psych: Normal mood.  Normal behavior. Musc: Right BKA with edema and tenderness, improving Neuro:  Alert Motor: Bilateral upper extremities, left lower extremity: 5/5 proximal distal Right lower extremity: Hip flexion, knee extension 3/5 (pain inhibition)  Assessment/Plan: 1. Functional deficits which require 3+ hours per day of interdisciplinary therapy in a comprehensive inpatient rehab setting.  Physiatrist is providing close team supervision and 24 hour management of active medical problems listed below.  Physiatrist and rehab team continue to assess barriers to discharge/monitor patient progress toward functional and medical  goals   Care Tool:  Bathing    Body parts bathed by patient: Right arm, Left arm, Abdomen, Chest, Front perineal area, Buttocks, Right upper leg, Left upper leg, Left lower leg, Face     Body parts n/a: Right lower leg   Bathing assist Assist Level: Contact Guard/Touching assist     Upper Body Dressing/Undressing Upper body dressing   What is the patient wearing?: Pull over shirt    Upper body assist Assist Level: Independent    Lower Body Dressing/Undressing Lower body dressing      What is the patient wearing?: Underwear/pull up     Lower body assist Assist for lower body dressing: Minimal Assistance - Patient > 75%     Toileting Toileting    Toileting assist Assist for toileting: Independent with assistive device Assistive Device Comment: urinal   Transfers Chair/bed transfer  Transfers assist     Chair/bed transfer assist level: Contact Guard/Touching assist     Locomotion Ambulation   Ambulation assist      Assist level: Contact Guard/Touching assist Assistive device: Walker-rolling Max distance: 67 ft   Walk 10 feet activity   Assist     Assist level: Contact Guard/Touching assist Assistive device: Walker-rolling   Walk 50 feet activity   Assist    Assist level: Contact Guard/Touching assist Assistive device: Walker-rolling    Walk 150 feet activity   Assist Walk 150 feet activity did not occur:  Safety/medical concerns    Assistive device: Walker-rolling    Walk 10 feet on uneven surface  activity   Assist     Assist level: Minimal Assistance - Patient > 75% Assistive device: Aeronautical engineer Will patient use wheelchair at discharge?: Yes Type of Wheelchair: Manual    Wheelchair assist level: Supervision/Verbal cueing, Set up assist Max wheelchair distance: >200 ft    Wheelchair 50 feet with 2 turns activity    Assist        Assist Level: Supervision/Verbal cueing    Wheelchair 150 feet activity     Assist      Assist Level: Supervision/Verbal cueing    Medical Problem List and Plan: 1.  Impaired mobility and ADLs secondary to right BKA for lower limb ischemia  Continue CIR  Continue stump protector  2.  Antithrombotics: -DVT/anticoagulation:  Pharmaceutical: Lovenox             -antiplatelet therapy: on ASA 3. Pain Management: has been using oxycodone 10 mg every 4 hours.   Gabapentin 100 3 times daily started on 11/26  Phantom limb pain continues to be severe but feels Gabapentin is making him twitch. Add cymbalta 20mg .  Monitor with increased exertion 4. Mood: LCSW to follow for evaluation and support.              -antipsychotic agents: N/a 5. Neuropsych: This patient is capable of making decisions on his own behalf. 6. Skin/Wound Care: Monitor wound drainage/healing.  Added multivitamin, Vitamin C and protein supplement to promote healing.   ABD bed, Kerlix, Ace wrap 7. Fluids/Electrolytes/Nutrition: Monitor I/Os. Discontinued IVF.  8. ABLA:   Hemoglobin 10.0 on 11/25  Continue to monitor 9. Acute on chronic renal failure (CKD3):   Creatinine 2.09 on 11/26, labs ordered for tomorrow  No echo on file, no history of CAD-IVF x 2 more nights   Encourage fluids 10. HTN: Monitor BP --continue Avapro and amlodipine-valsartan 5-160mg .   Controlled 11/29.   Monitor with increased mobility 11. COPD: Respiratory status stable on Breo and Incruse. Currently satting well on room air.  12. Hyperkalemia: K+ 5.4 on 11/29. Kayexalate x1 and repeat tomorrow  LOS: 5 days A FACE TO FACE EVALUATION WAS PERFORMED  Umeka Wrench P Adra Shepler 05/22/2020, 11:40 AM

## 2020-05-22 NOTE — Progress Notes (Signed)
Physical Therapy Session Note  Patient Details  Name: Ryan [name] MRN: [id number] Date of Birth: 12-17-1955  Today's Date: 05/22/2020 PT Individual Time: 0211-1735 and 1500-1526 PT Individual Time Calculation (min): 70 min and 26 min  Short Term Goals: Week 1:  PT Short Term Goal 1 (Week 1): STGs=LTGs  Skilled Therapeutic Interventions/Progress Updates:     1st Session: Pt received seated in WC and agrees to therapy. Complains of phantom pain in R lower extremity. RN present to provide pain medication. Pt self propels WC x200' to therapy gym at [facility]) level. Squat pivot from [facility] to mat table with supervision and cues to remove arm rest and approximate edge of WC to table for safety. Pt perform sit to prone transfer independently. In prone, pt performs following therex 2x10, with PT providing tactile cues for correct performance and to localize impact of exercises: R hip extension R hip abduction L hamstring curls with 4lb ankle weight L hip extension with 4lb ankle weight Prone press ups (pushing up to elbows) Scapular retractions without resistance Scapular retractions with 3lb weights in each hand  Shoulder stabilizing exercises performed to prevent overuse injuries. Pt transitions to sitting independently. Sit to stand multiple reps with supervision and cues for hand placement to increase safety. Pt performs 1x10 heel raises with B arm support, 1x10 with R arm support, and 1x10 with L arm support and CGA from PT for stability. Pt ambulates x100' with RW and cues to maintain upright gaze to improve posture and balance. Pt performs 2x10 step-ups with 5 inch platform nd RW, with PT providing CGA. Pt catches toe and final rep due to fatigue, but does not have LOB. Pt self propels WC back to room x200' Squat pivot back to bed with supervision and left supine with alarm intact and all needs within reach.  2nd Session: Pt received supine in bed and agrees to therapy. Reports 4/10 pain in  phantom R limb. Reports having just taken pain meds prior to therapy. Bed mobility independent. Squat pivot with supervision and cues on WC management and safety (locking brakes and lifting arm rest). Pt self propels 100' mod(I) with B upper extremities.   On mat table, pt performs NMR for standing balance and L lower extremity strengthening. Multiple reps of sit to stand without AD, requiring minA/CGA from PT. Pt then holds onto PT's shoulders to find equilibrium and practices single leg stance on L side. Pt able to complete up to 30 seconds with light minA at hips for stability. Without physical assistance whatsoever, pt able to balance for up to 15 seconds.   Pt performs partial sit-ups with wedge, holding onto 3lb bar with both hands. Upon sitting, pt performs targeted reaching with bar, knocking thrown ball back to PT. Performed to engage core and practice dynamic sitting balance and spontaneous reactions.   Pt left supine in bed with alarm intact and all needs within reach.  Therapy Documentation Precautions:  Precautions Precautions: Fall Precaution Comments: Drainage at R residual limb surgical wound Restrictions Weight Bearing Restrictions: Yes RLE Weight Bearing: Non weight bearing    Therapy/Group: Individual Therapy  Breck Coons, PT, DPT 05/22/2020, 3:40 PM

## 2020-05-22 NOTE — Progress Notes (Signed)
Pt slept soundly from 2300-0200, and then was awake on and off the rest of the night.

## 2020-05-23 ENCOUNTER — Inpatient Hospital Stay (HOSPITAL_COMMUNITY): Payer: 59

## 2020-05-23 ENCOUNTER — Inpatient Hospital Stay (HOSPITAL_COMMUNITY): Payer: 59 | Admitting: Occupational Therapy

## 2020-05-23 LAB — BASIC METABOLIC PANEL
Anion gap: 12 (ref 5–15)
BUN: 29 mg/dL — ABNORMAL HIGH (ref 8–23)
CO2: 22 mmol/L (ref 22–32)
Calcium: 9 mg/dL (ref 8.9–10.3)
Chloride: 102 mmol/L (ref 98–111)
Creatinine, Ser: 1.43 mg/dL — ABNORMAL HIGH (ref 0.61–1.24)
GFR, Estimated: 55 mL/min — ABNORMAL LOW (ref >60)
Glucose, Bld: 139 mg/dL — ABNORMAL HIGH (ref 70–99)
Potassium: 4.5 mmol/L (ref 3.5–5.1)
Sodium: 136 mmol/L (ref 135–145)

## 2020-05-23 NOTE — Progress Notes (Signed)
Patient ID: Ryan [name], male   DOB: Mar 26, 1956, 64 y.o.   MRN: [id number]  SW met with pt in room to provide updates from team conference, and d/c date 12/01/821. SW confirms DME delivered to room. [facility] preference is [place].  Pt called pt dtr [name] to give updates. Pt will be a late d/c tomorrow in which he will leave between 4pm-4:30pm.  HHPT/OT referral accepted by [facility]. Sw faxed orders to [facility] [phone].  Loralee Pacas, MSW, [place] Office: [phone] Cell: [phone] Fax: [phone]

## 2020-05-23 NOTE — Patient Care Conference (Signed)
Inpatient RehabilitationTeam Conference and Plan of Care Update Date: 05/23/2020   Time: 10:31 AM    Patient Name: Ryan [name]      Medical Record Number: [id number]  Date of Birth: 01-Jul-1955 Sex: Male         Room/Bed: 4W05C/4W05C-01 Payor Info: Payor: [employer]  / Plan: [employer] / Product Type: *No Product type* /    Admit Date/Time:  05/17/2020  4:30 PM  Primary Diagnosis:  PAD (peripheral artery disease) [facility])  Hospital Problems: Principal Problem:   PAD (peripheral artery disease) (HCC) Active Problems:   Below-knee amputation of right lower extremity ([name])   Essential hypertension   AKI (acute kidney injury) ([place])   Acute blood loss anemia   Postoperative pain   Phantom limb pain [name])    Expected Discharge Date: Expected Discharge Date: 05/24/20  Team Members Present: Physician leading conference: Dr. Leeroy Cha Care Coodinator Present: Loralee Pacas, LCSWA;Jaxston Chohan Creig Hines, RN, BSN, [place] Nurse Present: Rozetta Nunnery, RN PT Present: Tereasa Coop, PT OT Present: Cherylynn Ridges, OT PPS Coordinator present : Ileana Ladd, Burna Mortimer, SLP     Current Status/Progress Goal Weekly Team Focus  Bowel/Bladder   Continent of B/B. LBM 05/22/2020  Remain Contintinent of B/B  Assess B/B Qshift and PRN   Swallow/Nutrition/ Hydration             ADL's   Supervision/mod I  Mod i/supervision  self-care retraining, dc planning, standing balance, residual limb care   Mobility   supervision to mod(I) for all mobility, (S) gait up to 100' with RW  Mod(I)  DC prep   Communication             Safety/Cognition/ Behavioral Observations            Pain   PT stated pain 7/10, Medicated with percocet  Pain <3/10  Assess pain Qshift and PRN   Skin   Surgical incision on right BKA  Remainder skin intact  Assess skin Qshift and PRN     Discharge Planning:  Pt will need to be Mod I at d/c. Pt to d/c to home with his dtr [name] and her wife who will  provide intermittent support since they both work. DME ordered with [name].   Team Discussion: Continent B/B, IV to be removed per MD order. OT reports patient is Mod I and ready for discharge. PT reports patient is Mod I and ready for discharge. MD reports patient is medically ready to be discharged. Patient on target to meet rehab goals: yes  *See Care Plan and progress notes for long and short-term goals.   Revisions to Treatment Plan:  Patient is ready for discharge.  Teaching Needs: Family education complete.  Current Barriers to Discharge: Wound care and Weight bearing restrictions  Possible Resolutions to Barriers: Continue current medications, provide education on wound care and dressing changes, educate on weight bearing precautions, provide emotional support to patient and family.     Medical Summary Current Status: Residual limb pain is well controlled with oxycodone, has phantom limb pain that is uncontrolled  Barriers to Discharge: Medical stability;Wound care  Barriers to Discharge Comments: Has phantom limb pain that is uncontrolled, would like to appoint his daughter as power of attorney Possible Resolutions to [employer] Focus: Continue oxycodone for residual limb pain, started Cymbalta for pain, will consult chaplain to assist with notarization of power of attorney documentation   Continued Need for Acute Rehabilitation Level of Care: The patient requires daily medical  management by a physician with specialized training in physical medicine and rehabilitation for the following reasons: Direction of a multidisciplinary physical rehabilitation program to maximize functional independence : Yes Medical management of patient stability for increased activity during participation in an intensive rehabilitation regime.: Yes Analysis of laboratory values and/or radiology reports with any subsequent need for medication adjustment and/or medical intervention. :  Yes   I attest that I was present, lead the team conference, and concur with the assessment and plan of the team.   Dorthula Nettles G 05/23/2020, 1:18 PM

## 2020-05-23 NOTE — Plan of Care (Signed)
Problem: Sit to Stand Goal: LTG:  Patient will perform sit to stand in prep for activites of daily living with assistance level (OT) Description: LTG:  Patient will perform sit to stand in prep for activites of daily living with assistance level (OT) Outcome: Completed/Met   Problem: RH Bathing Goal: LTG Patient will bathe all body parts with assist levels (OT) Description: LTG: Patient will bathe all body parts with assist levels (OT) Outcome: Completed/Met   Problem: RH Dressing Goal: LTG Patient will perform lower body dressing w/assist (OT) Description: LTG: Patient will perform lower body dressing with assist, with/without cues in positioning using equipment (OT) Outcome: Completed/Met   Problem: RH Toileting Goal: LTG Patient will perform toileting task (3/3 steps) with assistance level (OT) Description: LTG: Patient will perform toileting task (3/3 steps) with assistance level (OT)  Outcome: Completed/Met   Problem: RH Toilet Transfers Goal: LTG Patient will perform toilet transfers w/assist (OT) Description: LTG: Patient will perform toilet transfers with assist, with/without cues using equipment (OT) Outcome: Completed/Met   Problem: RH Tub/Shower Transfers Goal: LTG Patient will perform tub/shower transfers w/assist (OT) Description: LTG: Patient will perform tub/shower transfers with assist, with/without cues using equipment (OT) Outcome: Completed/Met

## 2020-05-23 NOTE — Progress Notes (Signed)
Occupational Therapy Discharge Summary  Patient Details  Name: Ryan [name] MRN: [id number] Date of Birth: 11/11/1955  Today's Date: 05/23/2020 OT Individual Time: 0732-0830 OT Individual Time Calculation (min): 58 min   Pt greeted semi-reclined in bed and agreeable to OT treatment session. Pt completed bed mobility and ambulated into bathroom w/ RW and mod I. Pt transferred onto commode mod I, but was unable to have BM, continent void of bladder. Pt completed all BADL tasks at mod I level. He then propelled wc to therapy apartment and practiced ambulating into bathroom and tub bench transfer. Pt impulsive to stand without walker close enough at one point and needed verbal cue for safety. Discussed home kitchen set-up and accessing kitchen items from wc level. Pt propelled back to room and pivoted back to bed mod I. Pt left semi-reclined in bed with bed alarm on, call bell in reach, and needs met.   Patient has met 6 of 6 long term goals due to improved activity tolerance, improved balance, postural control and ability to compensate for deficits.  Patient to discharge at overall Modified Independent level.  Patient's care partner is independent to provide the necessary physical assistance at discharge for higher level iADL tasks.    Reasons goals not met: n/a  Recommendation:  Patient will benefit from ongoing skilled OT services in outpatient setting once he gets his prosthesis, to continue to advance functional skills in the area of BADL and iADL.  Equipment: tub transfer bench, 3-in-1 BSC  Reasons for discharge: treatment goals met and discharge from hospital  Patient/family agrees with progress made and goals achieved: Yes  OT Discharge Precautions/Restrictions  Precautions Precautions: Fall Restrictions Weight Bearing Restrictions: Yes RLE Weight Bearing: Non weight bearing Pain  6/10 to residual limb, rest and repositioned for comfort ADL ADL Eating: Independent Where  Assessed-Eating: Edge of bed Grooming: Independent Where Assessed-Grooming: Sitting at sink Upper Body Bathing: Independent Where Assessed-Upper Body Bathing: Sitting at sink Lower Body Bathing: Setup Where Assessed-Lower Body Bathing: Sitting at sink, Standing at sink Upper Body Dressing: Independent Where Assessed-Upper Body Dressing: Sitting at sink Lower Body Dressing: Modified independent Where Assessed-Lower Body Dressing: Sitting at sink, Standing at sink Toileting: Modified independent Where Assessed-Toileting: Bedside Commode Toilet Transfer: Modified independent Toilet Transfer Method: Counselling psychologist: Bedside commode, Grab bars Tub/Shower Transfer: Distant supervision Tub/Shower Transfer Method: Optometrist: Tourist information centre manager Overall Cognitive Status: Within Functional Limits for tasks assessed Arousal/Alertness: Awake/alert Orientation Level: Oriented X4 Safety/Judgment: Appears intact Mobility  Transfers Sit to Stand: Independent with assistive device Stand to Sit: Independent with assistive device  Balance Static Sitting Balance Static Sitting - Level of Assistance: 7: Independent Dynamic Sitting Balance Dynamic Sitting - Balance Support: During functional activity Dynamic Sitting - Level of Assistance: 7: Independent Static Standing Balance Static Standing - Balance Support: During functional activity Static Standing - Level of Assistance: 6: Modified independent (Device/Increase time) Dynamic Standing Balance Dynamic Standing - Balance Support: During functional activity Dynamic Standing - Level of Assistance: 6: Modified independent (Device/Increase time) Extremity/Trunk Assessment RUE Assessment RUE Assessment: Within Functional Limits LUE Assessment LUE Assessment: Within Functional Limits   Daneen Schick Halleigh Comes 05/23/2020, 7:59 AM

## 2020-05-23 NOTE — Progress Notes (Signed)
Ryan [name] PROGRESS NOTE  Subjective/Complaints: K+ has normalized today after kayexalate yesterday.  Cr improving Continues to have residual limb and phantom limb pain.  Hgb 10.1  ROS: Denies CP, SOB, N/V/D  Objective: Vital Signs: Blood pressure 125/74, pulse 68, temperature 98.2 F (36.8 C), temperature source Oral, resp. rate 18, height 5\' 8"  (1.727 m), weight 61.7 kg, SpO2 98 %. No results found. Recent Labs    05/22/20 0708  WBC 6.0  HGB 10.1*  HCT 31.6*  PLT 424*   Recent Labs    05/22/20 0708 05/23/20 0714  NA 139 136  K 5.4* 4.5  CL 105 102  CO2 25 22  GLUCOSE 107* 139*  BUN 30* 29*  CREATININE 1.53* 1.43*  CALCIUM 9.4 9.0    Intake/Output Summary (Last 24 hours) at 05/23/2020 1047 Last data filed at 05/23/2020 0730 Gross per 24 hour  Intake 377 ml  Output 925 ml  Net -548 ml        Physical Exam: BP 125/74 (BP Location: Right Arm)   Pulse 68   Temp 98.2 F (36.8 C) (Oral)   Resp 18   Ht 5\' 8"  (1.727 m)   Wt 61.7 kg   SpO2 98%   BMI 20.68 kg/m   Gen: no distress, normal appearing HEENT: oral mucosa pink and moist, NCAT Cardio: Reg rate Chest: normal effort, normal rate of breathing Abd: soft, non-distended Ext: no edema Skin: Warm and dry.  Right BKA with central area of moderate amount of serosanguineous drainage-no staple in that area. Psych: Normal mood.  Normal behavior. Musc: Right BKA with edema and tenderness, improving Neuro:  Alert Motor: Bilateral upper extremities, left lower extremity: 5/5 proximal distal Right lower extremity: Hip flexion, knee extension 3/5 (pain inhibition)  Assessment/Plan: 1. Functional deficits which require 3+ hours per day of interdisciplinary therapy in a comprehensive inpatient rehab setting.  Physiatrist is providing close team supervision and 24 hour management of active medical problems listed below.  Physiatrist and rehab team continue to assess barriers  to discharge/monitor patient progress toward functional and medical goals   Care Tool:  Bathing    Body parts bathed by patient: Right arm, Left arm, Chest, Abdomen, Front perineal area, Buttocks, Right upper leg, Left upper leg, Left lower leg, Face     Body parts n/a: Right lower leg   Bathing assist Assist Level: Set up assist     Upper Body Dressing/Undressing Upper body dressing   What is the patient wearing?: Pull over shirt    Upper body assist Assist Level: Independent    Lower Body Dressing/Undressing Lower body dressing      What is the patient wearing?: Underwear/pull up, Pants     Lower body assist Assist for lower body dressing: Independent with assitive device     Toileting Toileting    Toileting assist Assist for toileting: Independent with assistive device Assistive Device Comment: urinal   Transfers Chair/bed transfer  Transfers assist     Chair/bed transfer assist level: Independent with assistive device     Locomotion Ambulation   Ambulation assist      Assist level: Supervision/Verbal cueing Assistive device: Walker-rolling Max distance: 100'   Walk 10 feet activity   Assist     Assist level: Supervision/Verbal cueing Assistive device: Walker-rolling   Walk 50 feet activity   Assist    Assist level: Supervision/Verbal cueing Assistive device: Walker-rolling    Walk 150 feet activity   Assist Walk 150  feet activity did not occur: Safety/medical concerns    Assistive device: Walker-rolling    Walk 10 feet on uneven surface  activity   Assist     Assist level: Minimal Assistance - Patient > 75% Assistive device: Aeronautical engineer Will patient use wheelchair at discharge?: Yes Type of Wheelchair: Manual    Wheelchair assist level: Independent Max wheelchair distance: 200'    Wheelchair 50 feet with 2 turns activity    Assist        Assist Level: Independent    Wheelchair 150 feet activity     Assist      Assist Level: Independent    Medical Problem List and Plan: 1.  Impaired mobility and ADLs secondary to right BKA for lower limb ischemia  Continue CIR  Continue stump protector   -Interdisciplinary Team Conference today  2.  Antithrombotics: -DVT/anticoagulation:  Pharmaceutical: Lovenox             -antiplatelet therapy: on ASA 3. Pain Management: has been using oxycodone 10 mg every 4 hours.   Gabapentin 100 3 times daily started on 11/26  Phantom limb pain continues to be severe but feels Gabapentin is making him twitch. Added cymbalta 20mg  on 11/29- will monitor for effects of this change.   Monitor with increased exertion 4. Mood: LCSW to follow for evaluation and support.              -antipsychotic agents: N/a 5. Neuropsych: This patient is capable of making decisions on his own behalf. 6. Skin/Wound Care: Monitor wound drainage/healing.  Added multivitamin, Vitamin C and protein supplement to promote healing.   ABD bed, Kerlix, Ace wrap  May d/c IV 11/30 7. Fluids/Electrolytes/Nutrition: Monitor I/Os. Discontinued IVF.  8. ABLA:   Hemoglobin 10.0 on 11/25  Continue to monitor 9. Acute on chronic renal failure (CKD3):   Creatinine 2.09 on 11/26, down to 1.43 on 11/30  No echo on file, no history of CAD  Encourage fluids 10. HTN: Monitor BP --continue Avapro and amlodipine-valsartan 5-160mg .   Controlled 11/29.   Monitor with increased mobility 11. COPD: Respiratory status stable on Breo and Incruse. Currently satting well on room air.  12. Hyperkalemia: K+ 5.4 on 11/29. Kayexalate x1 and repeat tomorrow 13. POA: consulted chaplain to be present for notarization tomorrow- daughter will help patient complete paperwork in AM.   LOS: 6 days A FACE TO FACE EVALUATION WAS PERFORMED  Ryan [name] Ryan [name] 05/23/2020, 10:47 AM

## 2020-05-23 NOTE — Progress Notes (Signed)
Physical Therapy Discharge Summary  Patient Details  Name: Ryan [name] MRN: [id number] Date of Birth: September 09, 1955  Today's Date: 05/23/2020 PT Individual Time: [phone] and 1331-1416 PT Individual Time Calculation (min): 71 min and 45 min PT Missed Time: 15 minutes (fatigue)   Patient has met 13 of 13 long term goals due to improved activity tolerance, improved balance, increased strength and decreased pain.  Patient to discharge at an ambulatory level for household distances and wheelchair level for community mobility at [place].   Patient's care partner is independent to provide the necessary physical assistance at discharge.  Reasons goals not met: NA  Recommendation:  Patient will benefit from ongoing skilled PT services in outpatient setting to continue to advance safe functional mobility, address ongoing impairments in strength, balance, ambulation, residual limb preparation for prosthesis, and minimize fall risk.  Equipment: anti-tippers for WC, R BKA amputee pad  Reasons for discharge: treatment goals met and discharge from hospital  Patient/family agrees with progress made and goals achieved: Yes   Skilled Therapeutic Interventions: 1st Session: Pt received supine in bed and agrees to therapy. Reports 5/10 phantom pain in residual limb. PT provides education and repositioning for pain management and pt observed using desensitization techniques independently. Bed mobility performed independently. Squat pivot to WC mod(I). Pt propels WC x300' with bilateral upper extremities at mod(I) and demonstrates independence with WC part management. Car transfer with squat pivot mod(I). Pt performs ramp navigation with WC with verbal cues from PT on safe technique. Pt then completes ramp navigation with RW and step-to technique at mod(I). Seated rest break prior to further ambulation. Pt ambulates 110' with RW mod(I). PT educates pt on importance of full ROM for eventual  prosthesis fitting and pt demonstrates seated quad sets and full knee extension. PT demonstrates stair navigation via forward and backward approach. Pt attempts both and verbalizes increased ease with forward approach. Pt completes x10 6" steps with Bilateral hand rails and CGA. Pt then performs NMR for standing balance and L leg strength, performing rebound activity with trampoline. Pt initially steadies himself with RW and then is able to complete x20 tosses with CGA. Pt verbalizes increased soreness in back following activity. Pt propels WC x200' back to room. Toilet transfer mod(I). Pt left supine in bed with alarm intact and all needs within reach.  2nd Session: Pt received asleep in bed. Easily roused and agreeable to therapy. Reports residual limb pain, number not provided. PT provides repositioning to address pain symptoms. Bed mobility independent. Squat pivot to WC at mod(I). Pt transported via [facility] outside for time management and energy conservation. Pt performs WC propulsion outdoors for community reintegration training. Pt pushes manual WC over concrete and brick surfaces, uphill/downhill grades, and on sidewalk with lateral grade. Pt requires occasional minA while on lateral grade but otherwise independent with WC mobility. 300', and 400' with seated rest break in between.  Upon return to room, pt verbalizes need for bowel movement. PT provides verbal cues on technique for maneuvering WC over inclined threshold into bathroom. Pt performs transfer to/from toilet with mod(I). Pt left supine in bed with alarm intact and all needs within reach.  PT Discharge Precautions/Restrictions Precautions Precautions: Fall Restrictions Weight Bearing Restrictions: Yes RLE Weight Bearing: Non weight bearing Pain Pain Assessment Pain Scale: 0-10 Pain Score: 8  Pain Type: Acute pain;Surgical pain Pain Location: Leg Pain Orientation: Right Pain Descriptors / Indicators: Aching;Sharp Pain Frequency:  Intermittent Pain Onset: On-going Patients Stated Pain Goal: 4 Pain Intervention(s):  Medication (See eMAR) Vision/Perception  Perception Perception: Within Functional Limits Praxis Praxis: Intact  Cognition Overall Cognitive Status: Within Functional Limits for tasks assessed Arousal/Alertness: Awake/alert Orientation Level: Oriented X4 Safety/Judgment: Appears intact Sensation Sensation Light Touch: Appears Intact Coordination Gross Motor Movements are Fluid and Coordinated: Yes Fine Motor Movements are Fluid and Coordinated: Yes Motor  Motor Motor: Within Functional Limits  Mobility Transfers Transfers: Sit to Stand;Stand to Sit;Stand Pivot Transfers Sit to Stand: Independent with assistive device Stand to Sit: Independent with assistive device Stand Pivot Transfers: Independent with assistive device Locomotion  Gait Ambulation: Yes Gait Assistance: Independent with assistive device Gait Distance (Feet): 110 Feet Assistive device: Rolling walker Gait Gait: Yes Gait Pattern: Within Functional Limits;Step-to pattern Stairs / Additional Locomotion Stairs: Yes Stairs Assistance: Contact Guard/Touching assist Stair Management Technique: Two rails Number of Stairs: 10 Height of Stairs: 6 Ramp: Independent with assistive device Curb: Independent with assistive Production manager Mobility: Yes Wheelchair Assistance: Independent with Camera operator: Both upper extremities Distance: 300'  Trunk/Postural Assessment  Cervical Assessment Cervical Assessment: Within Functional Limits Thoracic Assessment Thoracic Assessment: Within Functional Limits Lumbar Assessment Lumbar Assessment: Within Functional Limits Postural Control Postural Control: Within Functional Limits  Balance Balance Balance Assessed: Yes Static Sitting Balance Static Sitting - Balance Support: No upper extremity supported Static Sitting - Level of  Assistance: 7: Independent Dynamic Sitting Balance Dynamic Sitting - Balance Support: During functional activity Dynamic Sitting - Level of Assistance: 7: Independent Static Standing Balance Static Standing - Balance Support: During functional activity Static Standing - Level of Assistance: 6: Modified independent (Device/Increase time) Dynamic Standing Balance Dynamic Standing - Balance Support: During functional activity Dynamic Standing - Level of Assistance: 6: Modified independent (Device/Increase time) Extremity Assessment  RUE Assessment RUE Assessment: Within Functional Limits LUE Assessment LUE Assessment: Within Functional Limits RLE Assessment Active Range of Motion (AROM) Comments: ROM WFL General Strength Comments: R hip flexion WFL, otherwise not teste due to BKA LLE Assessment LLE Assessment: Within Functional Limits    Breck Coons, PT, DPT 05/23/2020, 10:12 AM

## 2020-05-24 MED ORDER — DULOXETINE HCL 30 MG PO CPEP: 30.0000 mg | ORAL_CAPSULE | Freq: Every day | ORAL | 0 refills | Status: DC

## 2020-05-24 MED ORDER — OXYCODONE-ACETAMINOPHEN 5-325 MG PO TABS
1.0000 | ORAL_TABLET | ORAL | 0 refills | Status: DC | PRN
Start: 2020-05-24 — End: 2020-05-24

## 2020-05-24 MED ORDER — PANTOPRAZOLE SODIUM 40 MG PO TBEC: 40.0000 mg | DELAYED_RELEASE_TABLET | Freq: Every day | ORAL | 0 refills | Status: DC

## 2020-05-24 MED ORDER — OXYCODONE-ACETAMINOPHEN 5-325 MG PO TABS
1.0000 | ORAL_TABLET | ORAL | 0 refills | Status: DC | PRN
Start: 2020-05-24 — End: 2020-06-12

## 2020-05-24 MED ORDER — ADULT MULTIVITAMIN W/MINERALS CH: 1.0000 | ORAL_TABLET | Freq: Every day | ORAL | Status: DC

## 2020-05-24 MED ORDER — DOCUSATE SODIUM 100 MG PO CAPS: 100.0000 mg | ORAL_CAPSULE | Freq: Every day | ORAL | 0 refills | Status: DC

## 2020-05-24 MED ORDER — GABAPENTIN 100 MG PO CAPS: 100.0000 mg | ORAL_CAPSULE | Freq: Three times a day (TID) | ORAL | 0 refills | Status: DC

## 2020-05-24 MED ORDER — ASCORBIC ACID 500 MG PO TABS: 500.0000 mg | ORAL_TABLET | Freq: Every day | ORAL | 0 refills | Status: DC

## 2020-05-24 NOTE — Progress Notes (Signed)
[name] PHYSICAL MEDICINE & REHABILITATION PROGRESS NOTE  Subjective/Complaints: Daughter to arrive soon, patient asks about his medications. When asked he mentions that residual limb pain is well controlled but phantom limb pain persists. Discussed that if Cymbalta is well tolerated we can increase dose outpatient.   ROS: Denies CP, SOB, N/V/D  Objective: Vital Signs: Blood pressure 108/67, pulse (!) 58, temperature 97.8 F (36.6 C), resp. rate 17, height 5\' 8"  (1.727 m), weight 61.7 kg, SpO2 97 %. No results found. Recent Labs    05/22/20 0708  WBC 6.0  HGB 10.1*  HCT 31.6*  PLT 424*   Recent Labs    05/22/20 0708 05/23/20 0714  NA 139 136  K 5.4* 4.5  CL 105 102  CO2 25 22  GLUCOSE 107* 139*  BUN 30* 29*  CREATININE 1.53* 1.43*  CALCIUM 9.4 9.0    Intake/Output Summary (Last 24 hours) at 05/24/2020 0934 Last data filed at 05/24/2020 0715 Gross per 24 hour  Intake 717 ml  Output 2425 ml  Net -1708 ml        Physical Exam: BP 108/67 (BP Location: Left Arm)   Pulse (!) 58   Temp 97.8 F (36.6 C)   Resp 17   Ht 5\' 8"  (1.727 m)   Wt 61.7 kg   SpO2 97%   BMI 20.68 kg/m   Gen: no distress, normal appearing HEENT: oral mucosa pink and moist, NCAT Cardio: Reg rate Chest: normal effort, normal rate of breathing Abd: soft, non-distended Ext: no edema Skin: Right BKA with central area of moderate amount of serosanguineous drainage-no staple in that area. Psych: Normal mood.  Normal behavior. Musc: Right BKA with edema and tenderness, improving Neuro:  Alert Motor: Bilateral upper extremities, left lower extremity: 5/5 proximal distal Right lower extremity: Hip flexion, knee extension 3/5 (pain inhibition)  Assessment/Plan: 1. Functional deficits which require 3+ hours per day of interdisciplinary therapy in a comprehensive inpatient rehab setting.  Physiatrist is providing close team supervision and 24 hour management of active medical problems listed  below.  Physiatrist and rehab team continue to assess barriers to discharge/monitor patient progress toward functional and medical goals   Care Tool:  Bathing    Body parts bathed by patient: Right arm, Left arm, Chest, Abdomen, Front perineal area, Buttocks, Right upper leg, Left upper leg, Left lower leg, Face     Body parts n/a: Right lower leg   Bathing assist Assist Level: Set up assist     Upper Body Dressing/Undressing Upper body dressing   What is the patient wearing?: Pull over shirt    Upper body assist Assist Level: Independent    Lower Body Dressing/Undressing Lower body dressing      What is the patient wearing?: Underwear/pull up, Pants     Lower body assist Assist for lower body dressing: Independent with assitive device     Toileting Toileting    Toileting assist Assist for toileting: Independent with assistive device Assistive Device Comment: urinal   Transfers Chair/bed transfer  Transfers assist     Chair/bed transfer assist level: Independent with assistive device Chair/bed transfer assistive device: Programmer, multimedia   Ambulation assist      Assist level: Independent with assistive device Assistive device: Walker-rolling Max distance: 110'   Walk 10 feet activity   Assist     Assist level: Independent with assistive device Assistive device: Walker-rolling   Walk 50 feet activity   Assist    Assist level:  Independent with assistive device Assistive device: Walker-rolling    Walk 150 feet activity   Assist Walk 150 feet activity did not occur: Safety/medical concerns    Assistive device: Walker-rolling    Walk 10 feet on uneven surface  activity   Assist     Assist level: Independent with assistive device Assistive device: Aeronautical engineer Will patient use wheelchair at discharge?: Yes Type of Wheelchair: Manual    Wheelchair assist level: Independent Max  wheelchair distance: 300'    Wheelchair 50 feet with 2 turns activity    Assist        Assist Level: Independent   Wheelchair 150 feet activity     Assist      Assist Level: Independent    Medical Problem List and Plan: 1.  Impaired mobility and ADLs secondary to right BKA for lower limb ischemia  Continue CIR  Continue stump protector   -Interdisciplinary Team Conference today  2.  Antithrombotics: -DVT/anticoagulation:  Pharmaceutical: Lovenox             -antiplatelet therapy: on ASA 3. Pain Management: has been using oxycodone 10 mg every 4 hours.   Gabapentin 100 3 times daily started on 11/26  Phantom limb pain continues to be severe but feels Gabapentin is making him twitch. Added cymbalta 20mg  on 11/29- will monitor for effects of this change. 11/30: continues to have phantom limb pain. No great benefit or side effects of Cymbalta thus far.   Monitor with increased exertion 4. Mood: LCSW to follow for evaluation and support.              -antipsychotic agents: N/a 5. Neuropsych: This patient is capable of making decisions on his own behalf. 6. Skin/Wound Care: Monitor wound drainage/healing.  Added multivitamin, Vitamin C and protein supplement to promote healing.   ABD bed, Kerlix, Ace wrap  May d/c IV 11/30 7. Fluids/Electrolytes/Nutrition: Monitor I/Os. Discontinued IVF.  8. ABLA:   Hemoglobin 10.0 on 11/25  Continue to monitor 9. Acute on chronic renal failure (CKD3):   Creatinine 2.09 on 11/26, down to 1.43 on 11/30  No echo on file, no history of CAD  Encourage fluids 10. HTN: Monitor BP --continue Avapro and amlodipine-valsartan 5-160mg .   Controlled 11/30- soft today- monitor outpatient.   Monitor with increased mobility 11. COPD: Respiratory status stable on Breo and Incruse. Currently satting well on room air.  12. Hyperkalemia: K+ 5.4 on 11/29. Kayexalate x1 and normalized on repeat 13. POA: consulted chaplain to be present for notarization  today- daughter will help patient complete paperwork in AM.    >30 minutes spent in discharge of patient including review of medications and follow-up appointments, physical examination, reviewing labs, BP, discussing pain, POA, and in answering all patient's questions  LOS: 7 days A FACE TO FACE EVALUATION WAS PERFORMED  Ryan [name] 05/24/2020, 9:34 AM

## 2020-05-24 NOTE — Progress Notes (Addendum)
  Progress Note    05/24/2020 9:03 AM   Subjective:  No complaints; says he is going home today.  Says he is still having some oozing from the incision.   Vitals:   05/23/20 2030 05/24/20 0502  BP: 135/89 108/67  Pulse: 66 (!) 58  Resp: 16 17  Temp: 97.8 F (36.6 C) 97.8 F (36.6 C)  SpO2: 99% 97%    Physical Exam: Incisions:  Right BKA looks good.  There is a mild bloody ooze from mid incision.       CBC    Component Value Date/Time   WBC 6.0 05/22/2020 0708   RBC 3.28 (L) 05/22/2020 0708   HGB 10.1 (L) 05/22/2020 0708   HGB 13.1 08/31/2015 0929   HCT 31.6 (L) 05/22/2020 0708   HCT 38.5 08/31/2015 0929   PLT 424 (H) 05/22/2020 0708   PLT 217 08/31/2015 0929   MCV 96.3 05/22/2020 0708   MCV 93 08/31/2015 0929   MCH 30.8 05/22/2020 0708   MCHC 32.0 05/22/2020 0708   RDW 11.9 05/22/2020 0708   RDW 12.8 08/31/2015 0929   LYMPHSABS 1.5 05/18/2020 0400   LYMPHSABS 1.5 08/31/2015 0929   MONOABS 0.7 05/18/2020 0400   EOSABS 0.2 05/18/2020 0400   EOSABS 0.1 08/31/2015 0929   BASOSABS 0.0 05/18/2020 0400   BASOSABS 0.0 08/31/2015 0929    BMET    Component Value Date/Time   NA 136 05/23/2020 0714   NA 137 08/31/2015 0929   K 4.5 05/23/2020 0714   CL 102 05/23/2020 0714   CO2 22 05/23/2020 0714   GLUCOSE 139 (H) 05/23/2020 0714   BUN 29 (H) 05/23/2020 0714   BUN 11 08/31/2015 0929   CREATININE 1.43 (H) 05/23/2020 0714   CREATININE 1.51 (H) 03/14/2020 0919   CALCIUM 9.0 05/23/2020 0714   GFRNONAA 55 (L) 05/23/2020 0714   GFRNONAA 49 (L) 03/14/2020 0919   GFRAA 57 (L) 03/14/2020 0919    INR    Component Value Date/Time   INR 1.2 05/09/2020 0709     Intake/Output Summary (Last 24 hours) at 05/24/2020 0903 Last data filed at 05/24/2020 0715 Gross per 24 hour  Intake 717 ml  Output 2425 ml  Net -1708 ml     Assessment/Plan:  64 y.o. male is s/p right below knee amputation by Dr. [name]   -pt doing well and incision is healing nicely and he is  discharging home today.  He does have a mild bloody ooze mid incision and benzoin and a steri strip was applied.  Stump was re-wrapped with kerlix and ace wrap. -he has f/u appt with Dr. [name] on 06/09/2020.   Leontine Locket, PA-C [facility] [phone] 05/24/2020 9:03 AM    I have independently interviewed and examined patient and agree with PA assessment and plan above.   Nahia Nissan C. [name], MD [facility] [place] Office: [phone] Pager: [phone]

## 2020-05-24 NOTE — Discharge Summary (Signed)
Physician Discharge Summary  Patient ID: Ryan [name] MRN: [id number] DOB/AGE: 1955-09-20 64 y.o.  Admit date: 05/17/2020 Discharge date: 05/24/2020  Discharge Diagnoses:  Principal Problem:   PAD (peripheral artery disease) ([place]) Active Problems:   Below-knee amputation of right lower extremity (HCC)   Essential hypertension   AKI (acute kidney injury) ([place])   Acute blood loss anemia   Postoperative pain   Phantom limb pain [facility])   Discharged Condition: stable   Significant Diagnostic Studies: N/A   Labs:  Basic Metabolic Panel: Recent Labs  Lab 05/18/20 0400 05/19/20 1208 05/22/20 0708 05/23/20 0714  NA 135 134* 139 136  K 4.8 4.7 5.4* 4.5  CL 99 101 105 102  CO2 23 22 25 22   GLUCOSE 100* 98 107* 139*  BUN 37* 42* 30* 29*  CREATININE 1.92* 2.09* 1.53* 1.43*  CALCIUM 9.2 8.8* 9.4 9.0    CBC: CBC Latest Ref Rng & Units 05/22/2020 05/18/2020 05/14/2020  WBC 4.0 - 10.5 K/uL 6.0 7.4 7.8  Hemoglobin 13.0 - 17.0 g/dL 10.1(L) 10.0(L) 10.2(L)  Hematocrit 39 - 52 % 31.6(L) 30.7(L) 30.8(L)  Platelets 150 - 400 K/uL 424(H) 424(H) 436(H)    CBG: No results for input(s): GLUCAP in the last 168 hours.  Brief HPI:   Ryan [name] is a 64 y.o. male with history of CKD, neuropathy, COPD with DOE, anemia, PAD s/p multiple revascularization procedures with attempts at limb salvage.  He continued to have progressive RLE wounds with ischemia and claudication and was admitted on 05/09/20 for right-BKA by Dr. [name].  Postop with acute blood loss anemia which is being monitored.  Acute on chronic renal failure was improving.  He continued to have oozing with serosanguineous drainage from incision line.  Therapy was ongoing and CIR was recommended due to functional decline.   Hospital Course: TRAE Ryan [name] was admitted to rehab 05/17/2020 for inpatient therapies to consist of PT and OT at least three hours five days a week. Past admission physiatrist, therapy team and rehab  RN have worked together to provide customized collaborative inpatient rehab.  His blood pressures were monitored on TID basis and has been controlled on home regimen.  Serial check of lites showed acute on chronic renal failure to be resolving.  He did have transient hypokalemia that was treated with dose of Kayexalate.  He was advised to increase fluid intake and recommend follow-up check of BMET 7 to 10 days to monitor renal status and potassium levels.  Subcu Lovenox was used for DVT prophylaxis during his stay.  Follow-up CBC shows acute blood loss anemia is stable.  BKA incision has been monitored for healing and he continues to have minimal serosanguineous drainage from wound.  He has been educated on [residual limb wrapping and daily dressing changes past discharge.  Phantom pain continues to be an issue.  He has been educated on desensitization techniques.  Gabapentin was added at 100 mg twice daily.  Cymbalta also added increased to 30 mg p.o. at discharge.  He continues to require oxycodone 10 mg 4-5 times a day day and has been educated on importance of weaning this post discharge.  His activity tolerance and endurance have improved and he has made good gains during his stay.  He is currently modified independent at wheelchair level.  He will continue to receive follow-up home health PT and [facility] after [facility].   Rehab course: During patient's stay in rehab team conference was held to monitor patient's  progress, set goals and discuss barriers to discharge. At admission, patient required min assist with ADL tasks and with mobility. He  He has had improvement in activity tolerance, balance, postural control as well as ability to compensate for deficits.  He is able to complete ADL tasks at modified independent level. He is modified independent for transfers and to ambulate 48' with RW. Family education has been completed.   Disposition: Home  Diet: Heart healthy. Limit dairy,  citrus fruits and tomatoes    Special Instructions: 1. Cleanse incision with soap and water, pat dry and apply dry compressive dressing.  2. No driving or strenuous activity till cleared by MD.   Allergies as of 05/24/2020   No Known Allergies     Medication List    STOP taking these medications   acetaminophen 500 MG tablet Commonly known as: TYLENOL     TAKE these medications   albuterol 108 (90 Base) MCG/ACT inhaler Commonly known as: VENTOLIN HFA Inhale 2 puffs into the lungs every 6 (six) hours as needed for wheezing or shortness of breath.   amLODipine-valsartan 5-160 MG tablet Commonly known as: Exforge Take 1 tablet by mouth daily. In place of amlodipine 5 mg What changed: additional instructions   ascorbic acid 500 MG tablet Commonly known as: VITAMIN C Take 1 tablet (500 mg total) by mouth daily. Start taking on: May 25, 2020 Notes to patient: Over the counter   aspirin EC 81 MG tablet Take 1 tablet (81 mg total) by mouth daily.   atorvastatin 20 MG tablet Commonly known as: LIPITOR Take 1 tablet (20 mg total) by mouth daily.   B-12 500 MCG Subl Place 1 tablet under the tongue daily. What changed: how much to take Notes to patient: Over the counter   clopidogrel 75 MG tablet Commonly known as: PLAVIX Take 75 mg by mouth daily.   Dialyvite Vitamin D 5000 125 MCG (5000 UT) capsule Generic drug: Cholecalciferol Take 5,000 Units by mouth daily.   docusate sodium 100 MG capsule Commonly known as: COLACE Take 1 capsule (100 mg total) by mouth daily. Start taking on: May 25, 2020 Notes to patient: Over the counter--for constipation   DULoxetine 30 MG capsule Commonly known as: CYMBALTA Take 1 capsule (30 mg total) by mouth daily. Start taking on: May 25, 2020 Notes to patient: Dose increased today to help with phantom pain--take daily   gabapentin 100 MG capsule Commonly known as: NEURONTIN Take 1 capsule (100 mg total) by mouth 3  (three) times daily.   multivitamin with minerals Tabs tablet Take 1 tablet by mouth daily. Start taking on: May 25, 2020   oxyCODONE-acetaminophen 5-325 MG tablet--Rx#70 pills Commonly known as: PERCOCET/ROXICET Take 1-2 tablets by mouth every 4 (four) hours as needed for severe pain. Notes to patient: You need to start tapering narcotics and Limit to 6 pills or less per day.     pantoprazole 40 MG tablet Commonly known as: PROTONIX Take 1 tablet (40 mg total) by mouth daily. Start taking on: May 25, 2020   thiamine 50 MG tablet Commonly known as: VITAMIN B-1 Take 1 tablet (50 mg total) by mouth daily.   Trelegy Ellipta 100-62.5-25 MCG/INH Aepb Generic drug: Fluticasone-Umeclidin-Vilant Inhale 1 puff into the lungs daily as needed (shortness of breath).       Follow-up Information    Steele Sizer, MD. Call.   Specialty: Family Medicine Why: for follow up appointment in 7-10 days. Needs to have potassium  levels rechecked.  Contact information: [address] [place] [address] [phone]        Izora Ribas, MD Follow up.   Specialty: Physical Medicine and Rehabilitation Why: Office will call you with follow up appointment Contact information: [address] [place] [address] [phone]        Waynetta Sandy, MD Follow up on 06/09/2020.   Specialties: Vascular Surgery, Cardiology Why: Appointment at 8:20 am.  Contact information: [place] [address] [phone]               Signed: Bary Leriche 05/24/2020, 5:45 PM

## 2020-05-24 NOTE — Progress Notes (Signed)
Patient ID: Ryan [name], male   DOB: 10/13/1955, 64 y.o.   MRN: [id number]  SW met with pt in room to inform on HHPT/OT referral accepted by [facility]. SW informed pt assigned RN on pt late discharge.   Auria Chamberlain, MSW, LCSWA Office: [phone] Cell: [phone] Fax: [phone] 

## 2020-05-24 NOTE — Discharge Instructions (Signed)
Inpatient Rehab Discharge Instructions  Ryan [name] Discharge date and time: 05/24/20    Activities/Precautions/ Functional Status: Activity: no lifting, driving, or strenuous exercise for till cleared by MD Diet: low fat, low cholesterol diet---limit intake of dairy, tomatoes and citrus fruits.  Wound Care: Cleanse with normal saline. Pat dry and apply dry dressing. Change daily and more frequently if soiled or wet. Contact Dr. [name] if you develop any problems with your incision/wound--redness, swelling, increase in pain, drainage or if you develop fever or chills.    Functional status:  ___ No restrictions     ___ Walk up steps independently _X__ 24/7 supervision/assistance   ___ Walk up steps with assistance ___ Intermittent supervision/assistance  ___ Bathe/dress independently ___ Walk with walker     ___ Bathe/dress with assistance ___ Walk Independently    ___ Shower independently ___ Walk with assistance    ___ Shower with assistance ___ No alcohol     ___ Return to work/school ________  Special Instructions:  COMMUNITY REFERRALS UPON DISCHARGE:    Home Health:   PT     OT                  Agency: [facility]  Phone: [phone] *Please expect follow-up within 2-3 days to schedule your home visit. If you have not received follow-up, be sure to contact the branch directly.*   Medical Equipment/Items Ordered: DME parts: anti-tippers and R amputee pad; tub transfer bench,and 3in1 bedside commode                                                 Agency/Supplier: [facility] [phone]    My questions have been answered and I understand these instructions. I will adhere to these goals and the provided educational materials after my discharge from the hospital.  Patient/Caregiver Signature _______________________________ Date __________  Clinician Signature _______________________________________ Date __________  Please bring this form and your medication list  with you to all your follow-up doctor's appointments.

## 2020-05-25 ENCOUNTER — Telehealth: Payer: Self-pay

## 2020-05-25 NOTE — Telephone Encounter (Signed)
Sharyn Lull with [facility] Called:  Verbal okay given for Physical Therapy twice a week for 4 weeks.   Mr. Azer wanted to confirm he is to take aspirin & Plavix?  Please advise.    (Call back ph [phone] Sharyn Lull PT).

## 2020-05-25 NOTE — Progress Notes (Signed)
Inpatient Rehabilitation Care Coordinator  Discharge Note  The overall goal for the admission was met for:   Discharge location: Yes. D/c to his daughter [name]'s home, in which pt will have intermittent support due to work schedule.   Length of Stay: Yes. 7 days.   Discharge activity level: Yes. Mod I.  Home/community participation: Yes. Limited.   Services provided included: MD, RD, PT, OT, RN, CM, TR, Pharmacy, Neuropsych and SW  Financial Services: Other: [facility]  Follow-up services arranged: Home Health: [facility] for HHPT/OT and DME: DME parts: anti-tippers and R amputee pad; TTB, 3in1 BSC  Comments (or additional information): contact pt # [phone]  Or pt dtr [name] # [phone]  Patient/Family verbalized understanding of follow-up arrangements: Yes  Individual responsible for coordination of the follow-up plan: Pt to have assistance with coordinating care needs.   Confirmed correct DME delivered: Rana Snare 05/25/2020    Loralee Pacas, MSW, [place] Office: [phone] Cell: [phone] Fax: [phone]

## 2020-05-25 NOTE — Telephone Encounter (Signed)
Transition Care Management Follow-up Telephone Call  Date of discharge and from where: 05/24/20 Ryan [name]  How have you been since you were released from the hospital? Pt states he is doing okay; pain 7/10 just after complete physical therapy.   Any questions or concerns? No  Items Reviewed:  Did the pt receive and understand the discharge instructions provided? Yes   Medications obtained and verified? Yes   Other? No   Any new allergies since your discharge? No   Dietary orders reviewed? Yes  Do you have support at home? Yes   Home Care and Equipment/Supplies: Were home health services ordered? yes If so, what is the name of the agency? [facility] - physical therapy  Has the agency set up a time to come to the patient's home? Yes - Physical Therapist in home during TCM call who reports pt doing well will transfers and wound care Were any new equipment or medical supplies ordered?  Yes: bedside commode and tub transfer bench Were you able to get the supplies/equipment? yes Do you have any questions related to the use of the equipment or supplies? No  Functional Questionnaire: (I = Independent and D = Dependent) ADLs: I  Bathing/Dressing- I  Meal Prep- I  Eating- I  Maintaining continence- I  Transferring/Ambulation- I with walker or wheelchair  Managing Meds- I  Follow up appointments reviewed:   PCP Hospital f/u appt confirmed? Yes  Scheduled to see Dr. Ancil Boozer on 05/29/20 @ 11:00.  [facility] f/u appt confirmed? Yes  Scheduled to see Dr. [name] on 06/09/20.  Are transportation arrangements needed? No   If their condition worsens, is the pt aware to call PCP or go to the Emergency Dept.? Yes  Was the patient provided with contact information for the PCP's office or ED? Yes  Was to pt encouraged to call back with questions or concerns? Yes

## 2020-05-26 ENCOUNTER — Telehealth: Payer: Self-pay

## 2020-05-26 NOTE — Telephone Encounter (Signed)
Please see response below

## 2020-05-26 NOTE — Telephone Encounter (Signed)
Please let him know that is ok, thank you!

## 2020-05-26 NOTE — Telephone Encounter (Signed)
Transitional Care Call--who you spoke with Mr. Nolberto Cheuvront   1. Are you/is patient experiencing any problems since coming home?No problems. Are there any questions regarding any aspect of care? None.  2. Are there any questions regarding medications administration/dosing? No questions.  Are meds being taken as prescribed? Yes.  Patient should review meds with caller to confirm. Medication reviewed.  Medication confirmed.   3. Have there been any falls? No falls.  4. Has Home Health been to the house and/or have they contacted you? Yes.  If not, have you tried to contact them? No need.  Can we help you contact them? N/A 5. Are bowels and bladder emptying properly? Constipaiton Patient advised to monitor intake of pain medicine. And stay hydrated.  Daughter will pick up stool softener today.  Are there any unexpected incontinence issues? No problem. If applicable, is patient following bowel/bladder programs? N/A  6. Any fevers, problems with breathing, unexpected pain? No.  7. Are there any skin problems or new areas of breakdown? No problem.  8. Has the patient/family member arranged specialty MD follow up (ie cardiology/neurology/renal/surgical/etc)? Daughter helping. Most appointment already scheduled. Phone contacts given for those that are not.  Can we help arrange? No problem.  9. Does the patient need any other services or support that we can help arrange? No  10. Are caregivers following through as expected in assisting the patient? Yes.         11. Has the patient quit smoking, drinking alcohol, or using drugs as recommended? Patient stated he does not smoke, drink, or use drugs.   Appointment Date/Time/ Arrival time/ and who they are seeing [place]

## 2020-05-26 NOTE — Telephone Encounter (Signed)
1. When taking Gabapentin 100 MG Mr. Kresse gets dizzy. He gets dizzy even when taking it with food. What can he do?  2. Also he is getting constipated. Patient advised to stay hydrated and monitor narcotic intake.   Call back phone [phone]

## 2020-05-26 NOTE — Telephone Encounter (Signed)
Nurse & Mr. Veldhuizen informed of Dr. Aline August reply.

## 2020-05-26 NOTE — Telephone Encounter (Signed)
Please let patient know he can take over the counter colace three times per day for constipation and to stop taking the Gabapentin, thank you!

## 2020-05-29 ENCOUNTER — Other Ambulatory Visit: Payer: Self-pay

## 2020-05-29 ENCOUNTER — Encounter: Payer: Self-pay | Admitting: Family Medicine

## 2020-05-29 ENCOUNTER — Ambulatory Visit (INDEPENDENT_AMBULATORY_CARE_PROVIDER_SITE_OTHER): Payer: 59 | Admitting: Family Medicine

## 2020-05-29 VITALS — BP 124/66 | HR 89 | Temp 97.8°F | Resp 16 | Ht 67.0 in | Wt 137.7 lb

## 2020-05-29 DIAGNOSIS — J449 Chronic obstructive pulmonary disease, unspecified: Secondary | ICD-10-CM

## 2020-05-29 DIAGNOSIS — N183 Chronic kidney disease, stage 3 unspecified: Secondary | ICD-10-CM

## 2020-05-29 DIAGNOSIS — G546 Phantom limb syndrome with pain: Secondary | ICD-10-CM

## 2020-05-29 DIAGNOSIS — I700 Atherosclerosis of aorta: Secondary | ICD-10-CM

## 2020-05-29 DIAGNOSIS — I129 Hypertensive chronic kidney disease with stage 1 through stage 4 chronic kidney disease, or unspecified chronic kidney disease: Secondary | ICD-10-CM

## 2020-05-29 DIAGNOSIS — N1831 Chronic kidney disease, stage 3a: Secondary | ICD-10-CM

## 2020-05-29 DIAGNOSIS — Z89511 Acquired absence of right leg below knee: Secondary | ICD-10-CM

## 2020-05-29 DIAGNOSIS — G4709 Other insomnia: Secondary | ICD-10-CM

## 2020-05-29 DIAGNOSIS — I739 Peripheral vascular disease, unspecified: Secondary | ICD-10-CM | POA: Diagnosis not present

## 2020-05-29 DIAGNOSIS — E538 Deficiency of other specified B group vitamins: Secondary | ICD-10-CM

## 2020-05-29 DIAGNOSIS — E519 Thiamine deficiency, unspecified: Secondary | ICD-10-CM

## 2020-05-29 DIAGNOSIS — I10 Essential (primary) hypertension: Secondary | ICD-10-CM

## 2020-05-29 DIAGNOSIS — I7789 Other specified disorders of arteries and arterioles: Secondary | ICD-10-CM

## 2020-05-29 MED ORDER — TRELEGY ELLIPTA 100-62.5-25 MCG/INH IN AEPB: 1.0000 | INHALATION_SPRAY | Freq: Every day | RESPIRATORY_TRACT | 5 refills | Status: DC | PRN

## 2020-05-29 MED ORDER — AMLODIPINE BESYLATE-VALSARTAN 5-160 MG PO TABS: 1.0000 | ORAL_TABLET | Freq: Every day | ORAL | 0 refills | Status: DC

## 2020-05-29 MED ORDER — DULOXETINE HCL 60 MG PO CPEP: 60.0000 mg | ORAL_CAPSULE | Freq: Every day | ORAL | 1 refills | Status: DC

## 2020-05-29 MED ORDER — CLOPIDOGREL BISULFATE 75 MG PO TABS
75.0000 mg | ORAL_TABLET | Freq: Every day | ORAL | 0 refills | Status: DC
Start: 2020-05-29 — End: 2020-08-30

## 2020-05-29 MED ORDER — TRAZODONE HCL 50 MG PO TABS: 25.0000 mg | ORAL_TABLET | Freq: Every evening | ORAL | 0 refills | Status: DC | PRN

## 2020-05-29 NOTE — Progress Notes (Signed)
Name: Ryan [name]   MRN: [id number]    DOB: Nov 14, 1955   Date:05/29/2020       Progress Note  Subjective  Chief Complaint  Follow Up  HPI  HTN:  No chest pain, dizziness  or palpitation . 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 Discussed follow up with vascular surgeon and needs yearly checks   PAD:  Ryan [name] has a long history of PAD with claudication, he used to see Dr. Lucky Cowboy but asked for a second opinion . He was sent to Dr. 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 He is doing well, did not resume Plavix yet, stump is getting changed at home daily and he has follow up with surgeon next week. Denies active bleeding. He is on Duloxetine for phantom pain, and we will increase dose to 60 mg  Alcoholism: he has not been drinking since hospital admission 05/09/2020, daughter came with him and states he has not been drinking at all.   Emphysema: he is on Trelegy helps with his breathing, he states no wheezing, but has intermittent sob with activity but stable . He quit smoking 12/2018 , reviewed recent CT done , he has emphysemea  Insomnia: he has noticed difficulty sleeping, taking benadryl otc but not helping, discussed sleep hygiene  Patient Active Problem List   Diagnosis Date Noted  . Phantom limb pain ([place])   . Essential hypertension   . AKI (acute kidney injury) ([place])   . Acute blood loss anemia   . Postoperative pain   . Below-knee amputation of right lower extremity ([place]) 05/17/2020  . Ischemia of right lower extremity 01/28/2019  . Iron deficiency anemia  11/23/2018  . Stage 3 chronic kidney disease ([place]) 10/21/2018  . Atherosclerosis of native arteries of extremity with rest pain ([place]) 10/13/2018  . PAD (peripheral artery disease) ([place]) 07/21/2018  . Ischemic leg 06/25/2018  . Claudication ([place]) 03/17/2018  . B12 deficiency 03/12/2018  . Vitamin B1 deficiency 03/12/2018  . Varicose veins of leg with pain, right 03/12/2018  . Enlarged thoracic aorta ([place]) 02/24/2018  . Alcoholism /alcohol abuse 02/24/2018  . Paresthesia 02/24/2018  . Ectatic aorta ([place]) 02/20/2018  . Emphysema lung ([place]) 02/20/2018  . Atherosclerosis of aorta ([place]) 02/20/2018  . Encounter for tobacco use cessation counseling   . Benign neoplasm of cecum   . Benign neoplasm of transverse colon   . Benign neoplasm of sigmoid colon   . Hypertension, benign 08/31/2015  . Tobacco use 08/31/2015    Past Surgical History:  Procedure Laterality Date  . ABDOMINAL AORTOGRAM W/LOWER EXTREMITY N/A 09/13/2019   Procedure: ABDOMINAL AORTOGRAM W/LOWER EXTREMITY;  Surgeon: Waynetta Sandy, MD;  Location: [facility];  Service: Cardiovascular;  Laterality: N/A;  . AMPUTATION Right 05/09/2020   Procedure: RIGHT BELOW KNEE AMPUTATION;  Surgeon: Waynetta Sandy, MD;  Location: [facility];  Service: Vascular;  Laterality: Right;  w/ a block  . COLONOSCOPY WITH PROPOFOL N/A 02/15/2016   Procedure: COLONOSCOPY WITH PROPOFOL;  Surgeon: Lucilla Lame, MD;  Location: [facility];  Service: Endoscopy;  Laterality: N/A;  . FEMORAL-POPLITEAL BYPASS GRAFT Right 09/14/2019   Procedure: BYPASS GRAFT FEMORAL-POPLITEAL ARTERY;  Surgeon: Waynetta Sandy, MD;  Location: [facility];  Service: Vascular;  Laterality: Right;  . HERNIA REPAIR  1999   left inguinal  . INSERTION OF ILIAC STENT Right 09/14/2019   Procedure: Insertion Of Common and External Iliac Stent;  Surgeon: Waynetta Sandy, MD;  Location: [facility];  Service: Vascular;  Laterality: Right;  . LOWER  EXTREMITY ANGIOGRAPHY Right 05/07/2018   Procedure: LOWER EXTREMITY ANGIOGRAPHY;  Surgeon: Algernon Huxley, MD;  Location: [facility];  Service: Cardiovascular;  Laterality: Right;  . LOWER EXTREMITY ANGIOGRAPHY Right 06/25/2018   Procedure: LOWER EXTREMITY ANGIOGRAPHY;  Surgeon: Algernon Huxley, MD;  Location: [facility];  Service: Cardiovascular;  Laterality: Right;  . LOWER EXTREMITY ANGIOGRAPHY Right 07/01/2018   Procedure: LOWER EXTREMITY ANGIOGRAPHY;  Surgeon: Algernon Huxley, MD;  Location: [facility];  Service: Cardiovascular;  Laterality: Right;  . LOWER EXTREMITY ANGIOGRAPHY Right 08/12/2018   Procedure: LOWER EXTREMITY ANGIOGRAPHY;  Surgeon: Algernon Huxley, MD;  Location: [facility];  Service: Cardiovascular;  Laterality: Right;  . LOWER EXTREMITY ANGIOGRAPHY Right 09/03/2018   Procedure: LOWER EXTREMITY ANGIOGRAPHY;  Surgeon: Algernon Huxley, MD;  Location: [facility];  Service: Cardiovascular;  Laterality: Right;  . LOWER EXTREMITY ANGIOGRAPHY Right 09/04/2018   Procedure: Lower Extremity Angiography;  Surgeon: Algernon Huxley, MD;  Location: [facility];  Service: Cardiovascular;  Laterality: Right;  . LOWER EXTREMITY ANGIOGRAPHY Right 10/01/2018   Procedure: LOWER EXTREMITY ANGIOGRAPHY;  Surgeon: Algernon Huxley, MD;  Location: [facility];  Service: Cardiovascular;  Laterality: Right;  . LOWER EXTREMITY ANGIOGRAPHY Right 10/01/2018   Procedure: Lower Extremity Angiography;  Surgeon: Algernon Huxley, MD;  Location: [facility];  Service: Cardiovascular;  Laterality: Right;  . LOWER EXTREMITY ANGIOGRAPHY Right 10/15/2018   Procedure: LOWER EXTREMITY ANGIOGRAPHY;  Surgeon: Algernon Huxley, MD;  Location: [facility];  Service: Cardiovascular;  Laterality: Right;  . LOWER EXTREMITY ANGIOGRAPHY Left 10/16/2018   Procedure: Lower Extremity Angiography;  Surgeon: Algernon Huxley, MD;  Location: [facility];  Service: Cardiovascular;   Laterality: Left;  . LOWER EXTREMITY ANGIOGRAPHY Left 01/20/2019   Procedure: LOWER EXTREMITY ANGIOGRAPHY;  Surgeon: Algernon Huxley, MD;  Location: [facility];  Service: Cardiovascular;  Laterality: Left;  . LOWER EXTREMITY ANGIOGRAPHY Right 01/21/2019   Procedure: Lower Extremity Angiography;  Surgeon: Algernon Huxley, MD;  Location: [facility];  Service: Cardiovascular;  Laterality: Right;  . LOWER EXTREMITY ANGIOGRAPHY Right 01/28/2019   Procedure: LOWER EXTREMITY ANGIOGRAPHY;  Surgeon: Algernon Huxley, MD;  Location: [facility];  Service: Cardiovascular;  Laterality: Right;  . LOWER EXTREMITY ANGIOGRAPHY Right 01/29/2019   Procedure: Lower Extremity Angiography;  Surgeon: Algernon Huxley, MD;  Location: [facility];  Service: Cardiovascular;  Laterality: Right;  . LOWER EXTREMITY ANGIOGRAPHY Right 04/04/2020   Procedure: LOWER EXTREMITY ANGIOGRAPHY;  Surgeon: Waynetta Sandy, MD;  Location: [facility];  Service: Cardiovascular;  Laterality: Right;  . LOWER EXTREMITY INTERVENTION Right 07/02/2018   Procedure: LOWER EXTREMITY INTERVENTION;  Surgeon: Algernon Huxley, MD;  Location: [facility];  Service: Cardiovascular;  Laterality: Right;  . PERIPHERAL VASCULAR BALLOON ANGIOPLASTY Left 04/04/2020   Procedure: PERIPHERAL VASCULAR BALLOON ANGIOPLASTY;  Surgeon: Waynetta Sandy, MD;  Location: [facility];  Service: Cardiovascular;  Laterality: Left;  external iliac  . POLYPECTOMY N/A 02/15/2016   Procedure: POLYPECTOMY;  Surgeon: Lucilla Lame, MD;  Location: [facility];  Service: Endoscopy;  Laterality: N/A;    Family History  Problem Relation Age of Onset  . COPD Mother   . Hypertension Father   . Heart attack Brother     Social History   Tobacco Use  . Smoking status: Former Smoker    Packs/day: 0.50    Years: 40.00    Pack years: 20.00    Types: Cigarettes    Start date: 07/21/1978    Quit date: 12/2018    Years  since quitting: 1.4  . Smokeless tobacco: Never Used  Substance Use Topics  . Alcohol use: Yes    Alcohol/week: 12.0 standard drinks    Types: 12 Cans of beer per week    Comment: 4 beers / week now per pt 05/05/20     Current Outpatient Medications:  .  albuterol (VENTOLIN HFA) 108 (90 Base) MCG/ACT inhaler, Inhale 2 puffs into the lungs every 6 (six) hours as needed for wheezing or shortness of breath., Disp: 18 g, Rfl: 0 .  amLODipine-valsartan (EXFORGE) 5-160 MG tablet, Take 1 tablet by mouth daily., Disp: 90 tablet, Rfl: 0 .  ascorbic acid (VITAMIN C) 500 MG tablet, Take 1 tablet (500 mg total) by mouth daily., Disp: 30 tablet, Rfl: 0 .  aspirin EC 81 MG tablet, Take 1 tablet (81 mg total) by mouth daily., Disp: 90 tablet, Rfl: 3 .  atorvastatin (LIPITOR) 20 MG tablet, Take 1 tablet (20 mg total) by mouth daily., Disp: 90 tablet, Rfl: 1 .  Cholecalciferol (DIALYVITE VITAMIN D 5000) 125 MCG (5000 UT) capsule, Take 5,000 Units by mouth daily., Disp: , Rfl:  .  Cyanocobalamin (B-12) 500 MCG SUBL, Place 1 tablet under the tongue daily. (Patient taking differently: Place 500 mcg under the tongue daily. ), Disp: 30 tablet, Rfl: 0 .  docusate sodium (COLACE) 100 MG capsule, Take 1 capsule (100 mg total) by mouth daily., Disp: 30 capsule, Rfl: 0 .  DULoxetine (CYMBALTA) 60 MG capsule, Take 1 capsule (60 mg total) by mouth daily., Disp: 90 capsule, Rfl: 1 .  Fluticasone-Umeclidin-Vilant (TRELEGY ELLIPTA) 100-62.5-25 MCG/INH AEPB, Inhale 1 puff into the lungs daily as needed (shortness of breath)., Disp: 60 each, Rfl: 5 .  Multiple Vitamin (MULTIVITAMIN WITH MINERALS) TABS tablet, Take 1 tablet by mouth daily., Disp: , Rfl:  .  NARCAN 4 MG/0.1ML LIQD nasal spray kit, 1 spray once., Disp: , Rfl:  .  oxyCODONE-acetaminophen (PERCOCET/ROXICET) 5-325 MG tablet, Take 1-2 tablets by mouth every 4 (four) hours as needed for severe pain., Disp: 70 tablet, Rfl: 0 .  thiamine (VITAMIN B-1) 50 MG tablet,  Take 1 tablet (50 mg total) by mouth daily., Disp: 30 tablet, Rfl: 2 .  clopidogrel (PLAVIX) 75 MG tablet, Take 1 tablet (75 mg total) by mouth daily., Disp: 90 tablet, Rfl: 0  No Known Allergies  I personally reviewed active problem list, medication list, allergies, family history, social history, health maintenance, notes from last encounter with the patient/caregiver today.  Medication reconciliation done during his visit    ROS  Constitutional: Negative for fever or weight change.  Respiratory: Negative for cough and shortness of breath.   Cardiovascular: Negative for chest pain or palpitations.  Gastrointestinal: Negative for abdominal pain, no bowel changes.  Musculoskeletal: Positive  for gait problem or joint swelling.  Skin: Negative for rash.  Neurological: Negative for dizziness or headache.  No other specific complaints in a complete review of systems (except as listed in HPI above).  Objective  Vitals:   05/29/20 1109  BP: 124/66  Pulse: 89  Resp: 16  Temp: 97.8 F (36.6 C)  TempSrc: Oral  SpO2: 99%  Weight: 137 lb 11.2 oz (62.5 kg)  Height: 5' 7"  (1.702 m)    Body mass index is 21.57 kg/m.  Physical Exam  Constitutional: Patient appears well-developed and well-nourished.No distress.  HEENT: head atraumatic, normocephalic, pupils equal and reactive to light, neck supple Cardiovascular: Normal rate, regular rhythm and normal heart sounds.  No murmur heard. No BLE edema. Pulmonary/Chest: Effort normal and breath sounds normal. No respiratory distress. Abdominal: Soft.  There is no tenderness. Muscular skeletal: bka , stump covered  Psychiatric: Patient has a normal mood and affect. behavior is normal. Judgment and thought content normal.  Recent Results (from the past 2160 hour(s))  Lipid panel     Status: None   Collection Time: 03/14/20  9:19 AM  Result Value Ref Range   Cholesterol 138 <200 mg/dL   HDL 73 > OR = 40 mg/dL   Triglycerides 60 <150  mg/dL   LDL Cholesterol (Calc) 51 mg/dL (calc)    Comment: Reference range: <100 . Desirable range <100 mg/dL for primary prevention;   <70 mg/dL for patients with CHD or diabetic patients  with > or = 2 CHD risk factors. Marland Kitchen LDL-C is now calculated using the Martin-Hopkins  calculation, which is a validated novel method providing  better accuracy than the Friedewald equation in the  estimation of LDL-C.  Cresenciano Genre et al. Annamaria Helling. 3220;254(27): 2061-2068  (http://education.[facility].com/faq/FAQ164)    Total CHOL/HDL Ratio 1.9 <5.0 (calc)   Non-HDL Cholesterol (Calc) 65 <130 mg/dL (calc)    Comment: For patients with diabetes plus 1 major ASCVD risk  factor, treating to a non-HDL-C goal of <100 mg/dL  (LDL-C of <70 mg/dL) is considered a therapeutic  option.   CBC with Differential/Platelet     Status: Abnormal   Collection Time: 03/14/20  9:19 AM  Result Value Ref Range   WBC 5.3 3.8 - 10.8 Thousand/uL   RBC 4.17 (L) 4.20 - 5.80 Million/uL   Hemoglobin 13.0 (L) 13.2 - 17.1 g/dL   HCT 39.2 38 - 50 %   MCV 94.0 80.0 - 100.0 fL   MCH 31.2 27.0 - 33.0 pg   MCHC 33.2 32.0 - 36.0 g/dL   RDW 12.5 11.0 - 15.0 %   Platelets 263 140 - 400 Thousand/uL   MPV 10.2 7.5 - 12.5 fL   Neutro Abs 3,567 1,500 - 7,800 cells/uL   Lymphs Abs 1,076 850 - 3,900 cells/uL   Absolute Monocytes 435 200 - 950 cells/uL   Eosinophils Absolute 180 15.0 - 500.0 cells/uL   Basophils Absolute 42 0.0 - 200.0 cells/uL   Neutrophils Relative % 67.3 %   Total Lymphocyte 20.3 %   Monocytes Relative 8.2 %   Eosinophils Relative 3.4 %   Basophils Relative 0.8 %  COMPLETE METABOLIC PANEL WITH GFR     Status: Abnormal   Collection Time: 03/14/20  9:19 AM  Result Value Ref Range   Glucose, Bld 74 65 - 99 mg/dL    Comment: .            Fasting reference interval .    BUN 20 7 - 25 mg/dL   Creat 1.51 (H) 0.70 - 1.25  mg/dL    Comment: For patients >36 years of age, the reference limit for Creatinine is  approximately 13% higher for people identified as African-American. .    GFR, Est Non African American 49 (L) > OR = 60 mL/min/1.45m   GFR, Est African American 57 (L) > OR = 60 mL/min/1.752m  BUN/Creatinine Ratio 13 6 - 22 (calc)   Sodium 140 135 - 146 mmol/L   Potassium 4.2 3.5 - 5.3 mmol/L   Chloride 106 98 - 110 mmol/L   CO2 24 20 - 32 mmol/L   Calcium 9.3 8.6 - 10.3 mg/dL   Total Protein 7.4 6.1 - 8.1 g/dL   Albumin 4.3 3.6 - 5.1 g/dL   Globulin 3.1 1.9 - 3.7 g/dL (calc)   AG Ratio 1.4 1.0 - 2.5 (calc)   Total Bilirubin 0.5 0.2 - 1.2 mg/dL   Alkaline phosphatase (APISO) 90 35 - 144 U/L   AST 13 10 - 35 U/L   ALT 10 9 - 46 U/L  Vitamin B12     Status: Abnormal   Collection Time: 03/14/20  9:19 AM  Result Value Ref Range   Vitamin B-12 >2,000 (H) 200 - 1,100 pg/mL  Vitamin B1     Status: None   Collection Time: 03/14/20  9:19 AM  Result Value Ref Range   Vitamin B1 (Thiamine) 16 8 - 30 nmol/L    Comment: . Marland Kitchenitamin supplementation within 24 hours prior to blood draw may affect the accuracy of the results. . This test was developed and its analytical performance characteristics have been determined by [facility] has not been cleared or approved by the U.S. Food and Drug Administration. This assay has been validated pursuant to the CLIA regulations and is used for clinical purposes. . Marland Kitchen SARS CORONAVIRUS 2 (TAT 6-24 HRS) Nasopharyngeal Nasopharyngeal Swab     Status: None   Collection Time: 04/01/20 10:22 AM   Specimen: Nasopharyngeal Swab  Result Value Ref Range   SARS Coronavirus 2 NEGATIVE NEGATIVE    Comment: (NOTE) SARS-CoV-2 target nucleic acids are NOT DETECTED.  The SARS-CoV-2 RNA is generally detectable in upper and lower respiratory specimens during the acute phase of infection. Negative results do not preclude SARS-CoV-2 infection, do not rule out co-infections with other pathogens, and should not be used as  the sole basis for treatment or other patient management decisions. Negative results must be combined with clinical observations, patient history, and epidemiological information. The expected result is Negative.  Fact Sheet for Patients: htSugarRoll.beFact Sheet for Healthcare Providers: hthttps://www.woods-mathews.com/This test is not yet approved or cleared by the [place] and  has been authorized for detection and/or diagnosis of SARS-CoV-2 by FDA under an Emergency Use Authorization (EUA). This EUA will remain  in effect (meaning this test can be used) for the duration of the COVID-19 declaration under Se ction 564(b)(1) of the Act, 21 U.S.C. section 360bbb-3(b)(1), unless the authorization is terminated or revoked sooner.  Performed at [facility] [place], [place] [id number] I-STAT, [place]     Status: Abnormal   Collection Time: 04/04/20  9:27 AM  Result Value Ref Range   Sodium 140 135 - 145 mmol/L   Potassium 4.8 3.5 - 5.1 mmol/L   Chloride 104 98 - 111 mmol/L   BUN 25 (H) 8 - 23 mg/dL   Creatinine, Ser 1.40 (H) 0.61 - 1.24 mg/dL  Glucose, Bld 97 70 - 99 mg/dL    Comment: Glucose reference range applies only to samples taken after fasting for at least 8 hours.   Calcium, Ion 1.23 1.15 - 1.40 mmol/L   TCO2 26 22 - 32 mmol/L   Hemoglobin 12.9 (L) 13.0 - 17.0 g/dL   HCT 38.0 (L) 39 - 52 %  POCT Activated clotting time     Status: None   Collection Time: 04/04/20 12:36 PM  Result Value Ref Range   Activated Clotting Time 224 seconds  POCT Activated clotting time     Status: None   Collection Time: 04/04/20  1:35 PM  Result Value Ref Range   Activated Clotting Time 191 seconds  POCT Activated clotting time     Status: None   Collection Time: 04/04/20  2:16 PM  Result Value Ref Range   Activated Clotting Time 175 seconds  SARS CORONAVIRUS 2 (TAT 6-24 HRS) Nasopharyngeal Nasopharyngeal Swab      Status: None   Collection Time: 05/06/20  2:13 PM   Specimen: Nasopharyngeal Swab  Result Value Ref Range   SARS Coronavirus 2 NEGATIVE NEGATIVE    Comment: (NOTE) SARS-CoV-2 target nucleic acids are NOT DETECTED.  The SARS-CoV-2 RNA is generally detectable in upper and lower respiratory specimens during the acute phase of infection. Negative results do not preclude SARS-CoV-2 infection, do not rule out co-infections with other pathogens, and should not be used as the sole basis for treatment or other patient management decisions. Negative results must be combined with clinical observations, patient history, and epidemiological information. The expected result is Negative.  Fact Sheet for Patients: SugarRoll.be  Fact Sheet for Healthcare Providers: https://www.woods-mathews.com/  This test is not yet approved or cleared by the [place] FDA and  has been authorized for detection and/or diagnosis of SARS-CoV-2 by FDA under an Emergency Use Authorization (EUA). This EUA will remain  in effect (meaning this test can be used) for the duration of the COVID-19 declaration under Se ction 564(b)(1) of the Act, 21 U.S.C. section 360bbb-3(b)(1), unless the authorization is terminated or revoked sooner.  Performed at [facility], [place] [address], [place], [place] [address]   Type and screen     Status: None   Collection Time: 05/09/20  6:48 AM  Result Value Ref Range   ABO/RH(D) A POS    Antibody Screen NEG    Sample Expiration      05/12/2020,2359 Performed at [facility], [place] [address], [place], [place] [address]   Urinalysis, Routine w reflex microscopic Urine, Clean Catch     Status: Abnormal   Collection Time: 05/09/20  7:07 AM  Result Value Ref Range   Color, Urine YELLOW YELLOW   APPearance CLEAR CLEAR   Specific Gravity, Urine 1.013 1.005 - 1.030   pH 5.0 5.0 - 8.0   Glucose, UA NEGATIVE NEGATIVE mg/dL   Hgb  urine dipstick NEGATIVE NEGATIVE   Bilirubin Urine NEGATIVE NEGATIVE   Ketones, ur NEGATIVE NEGATIVE mg/dL   Protein, ur 30 (A) NEGATIVE mg/dL   Nitrite NEGATIVE NEGATIVE   Leukocytes,Ua NEGATIVE NEGATIVE   RBC / HPF 0-5 0 - 5 RBC/hpf   WBC, UA 0-5 0 - 5 WBC/hpf   Bacteria, UA NONE SEEN NONE SEEN   Hyaline Casts, UA PRESENT     Comment: Performed at [facility] [address] [place], [place] [address]  APTT     Status: Abnormal   Collection Time: 05/09/20  7:09 AM  Result Value Ref Range   aPTT 38 (H) 24 - 36 seconds    Comment:        IF BASELINE aPTT IS ELEVATED, SUGGEST PATIENT RISK ASSESSMENT BE USED TO DETERMINE APPROPRIATE ANTICOAGULANT THERAPY. Performed at [facility], [place] [address], [place], [place] [address]   CBC     Status: Abnormal   Collection Time: 05/09/20  7:09 AM  Result Value Ref Range   WBC 6.6 4.0 - 10.5 K/uL   RBC 3.71 (L) 4.22 - 5.81 MIL/uL   Hemoglobin 11.6 (L) 13.0 - 17.0 g/dL   HCT 35.7 (L) 39 - 52 %   MCV 96.2 80.0 - 100.0 fL   MCH 31.3 26.0 - 34.0 pg   MCHC 32.5 30.0 - 36.0 g/dL   RDW 11.9 11.5 - 15.5 %   Platelets 409 (H) 150 - 400 K/uL   nRBC 0.0 0.0 - 0.2 %    Comment: Performed at [facility], [place] [address], [place], [place] [address]  Comprehensive metabolic panel     Status: Abnormal   Collection Time: 05/09/20  7:09 AM  Result Value Ref Range   Sodium 136 135 - 145 mmol/L   Potassium 5.1 3.5 - 5.1 mmol/L   Chloride 102 98 - 111 mmol/L   CO2 24 22 - 32 mmol/L   Glucose, Bld 105 (H) 70 - 99 mg/dL    Comment: Glucose reference range applies only to samples taken after fasting for at least 8 hours.   BUN 23 8 - 23 mg/dL   Creatinine, Ser 1.54 (H) 0.61 - 1.24 mg/dL   Calcium 9.4 8.9 - 10.3 mg/dL   Total Protein 7.6 6.5 - 8.1 g/dL   Albumin 3.5 3.5 - 5.0 g/dL   AST 27 15 - 41 U/L   ALT 28 0 - 44 U/L   Alkaline Phosphatase 81 38 - 126 U/L   Total Bilirubin 0.7 0.3 - 1.2 mg/dL   GFR, Estimated 50 (L) >60 mL/min     Comment: (NOTE) Calculated using the CKD-EPI Creatinine Equation (2021)    Anion gap 10 5 - 15    Comment: Performed at [facility] [address], [place], [place] [address]  Protime-INR     Status: None   Collection Time: 05/09/20  7:09 AM  Result Value Ref Range   Prothrombin Time 14.4 11.4 - 15.2 seconds   INR 1.2 0.8 - 1.2    Comment: (NOTE) INR goal varies based on device and disease states. Performed at [facility], [place] [address], [place], [place] [address]   I-STAT, [place] [address]     Status: Abnormal   Collection Time: 05/09/20  8:14 AM  Result Value Ref Range   Sodium 137 135 - 145 mmol/L   Potassium 4.5 3.5 - 5.1 mmol/L   Chloride 103 98 - 111 mmol/L   BUN 25 (H) 8 - 23 mg/dL   Creatinine, Ser 1.50 (H) 0.61 - 1.24 mg/dL   Glucose, Bld 115 (H) 70 - 99 mg/dL    Comment: Glucose reference range applies only to samples taken after fasting for at least 8 hours.   Calcium, Ion 1.25 1.15 - 1.40 mmol/L   TCO2 24 22 - 32 mmol/L   Hemoglobin 11.2 (L) 13.0 - 17.0 g/dL   HCT 33.0 (L) 39 - 52 %  Surgical pathology     Status: None   Collection Time: 05/09/20  9:08 AM  Result Value Ref Range   SURGICAL PATHOLOGY      SURGICAL PATHOLOGY CASE: [id number] PATIENT: Leighton Parody Surgical Pathology Report     Clinical History: critical lower limb ischemia (cm)     FINAL MICROSCOPIC DIAGNOSIS:  A. LEG, RIGHT BELOW KNEE, AMPUTATION: - Skin with ulceration and associated necrotizing inflammation - Moderate atherosclerosis of small to medium-caliber arteries    GROSS DESCRIPTION:  Specimen is received fresh and consists of a right below the knee amputation, measuring 23.0 cm heel-to-toe by 21.5 cm heel to posterior soft tissue resection margin by 33.5 cm heel to anterior soft tissue resection margin. There is 4.2 cm of exposed tibia and 4.1 cm of exposed fibula. All five toes are present. The anterior lateral aspect of the leg displays a 14.5 x 5.2 cm  tan-yellow to brown crusted and centrally ulcerated lesion. Additional crusted lesion is noted on the dorsal surface of the foot, measuring 1.6 x 1.5 cm. The remaining skin is tan-brown, and the soft tissue resection margin g rossly appears viable. The posterior tibial and anterior tibial arteries display mild to moderate atherosclerosis, and the posterior tibial and peroneal arteries are of small-caliber.  Representative sections are submitted in 4 cassettes. 1 = tissue from anterior leg 2 = posterior tibial artery 3 = anterior tibial artery 4 = peroneal artery Craig Staggers 05/09/2020)    Final Diagnosis performed by Jaquita Folds, MD.   Electronically signed 05/10/2020 Technical component performed at [employer]. [facility], [place] [address] [place], [place] [address].  Professional component performed at [facility], [place] [address], [place], [place] [address].  Immunohistochemistry Technical component (if applicable) was performed at [facility] [facility]. [address], [place], [place], [place] [address].   IMMUNOHISTOCHEMISTRY DISCLAIMER (if applicable): Some of these immunohistochemical stains may have been developed and the performance characteristi cs determine by [facility]. Some may not have been cleared or approved by the U.S. Food and Drug Administration. The FDA has determined that such clearance or approval is not necessary. This test is used for clinical purposes. It should not be regarded as investigational or for research. This laboratory is certified under the [place] (CLIA-88) as qualified to perform high complexity clinical laboratory testing.  The controls stained appropriately.   Basic metabolic panel     Status: Abnormal   Collection Time: 05/10/20  1:47 AM  Result Value Ref Range   Sodium 134 (L) 135 - 145 mmol/L   Potassium 4.9 3.5 - 5.1 mmol/L   Chloride 105 98 - 111  mmol/L   CO2 22 22 - 32 mmol/L   Glucose, Bld 182 (H) 70 - 99 mg/dL    Comment: Glucose reference range applies only to samples taken after fasting for at least 8 hours.   BUN 24 (H) 8 - 23 mg/dL   Creatinine, Ser 1.64 (H) 0.61 - 1.24 mg/dL   Calcium 8.7 (L) 8.9 - 10.3 mg/dL   GFR, Estimated 47 (L) >60 mL/min    Comment: (NOTE) Calculated using the CKD-EPI Creatinine Equation (2021)    Anion gap 7 5 - 15    Comment: Performed at [facility] [address], [place] [address]  CBC     Status: Abnormal   Collection Time: 05/10/20  1:47 AM  Result Value Ref Range   WBC 10.3 4.0 - 10.5 K/uL   RBC 3.47 (L) 4.22 - 5.81 MIL/uL   Hemoglobin 10.8 (L) 13.0 -  17.0 g/dL   HCT 32.5 (L) 39 - 52 %   MCV 93.7 80.0 - 100.0 fL   MCH 31.1 26.0 - 34.0 pg   MCHC 33.2 30.0 - 36.0 g/dL   RDW 11.6 11.5 - 15.5 %   Platelets 396 150 - 400 K/uL   nRBC 0.0 0.0 - 0.2 %    Comment: Performed at [facility], [place] [address], [place], [place] [address]  Basic metabolic panel     Status: Abnormal   Collection Time: 05/11/20  3:04 AM  Result Value Ref Range   Sodium 136 135 - 145 mmol/L   Potassium 5.1 3.5 - 5.1 mmol/L   Chloride 103 98 - 111 mmol/L   CO2 22 22 - 32 mmol/L   Glucose, Bld 103 (H) 70 - 99 mg/dL    Comment: Glucose reference range applies only to samples taken after fasting for at least 8 hours.   BUN 25 (H) 8 - 23 mg/dL   Creatinine, Ser 1.76 (H) 0.61 - 1.24 mg/dL   Calcium 9.3 8.9 - 10.3 mg/dL   GFR, Estimated 43 (L) >60 mL/min    Comment: (NOTE) Calculated using the CKD-EPI Creatinine Equation (2021)    Anion gap 11 5 - 15    Comment: Performed at [facility] [address], [place], [place] [address]  CBC     Status: Abnormal   Collection Time: 05/11/20  3:04 AM  Result Value Ref Range   WBC 9.6 4.0 - 10.5 K/uL   RBC 3.62 (L) 4.22 - 5.81 MIL/uL   Hemoglobin 11.2 (L) 13.0 - 17.0 g/dL   HCT 34.4 (L) 39 - 52 %   MCV 95.0 80.0 - 100.0 fL   MCH 30.9 26.0  - 34.0 pg   MCHC 32.6 30.0 - 36.0 g/dL   RDW 11.8 11.5 - 15.5 %   Platelets 444 (H) 150 - 400 K/uL   nRBC 0.0 0.0 - 0.2 %    Comment: Performed at [facility], [place] [address] [place], [place] [address]  CBC     Status: Abnormal   Collection Time: 05/14/20  6:03 AM  Result Value Ref Range   WBC 7.8 4.0 - 10.5 K/uL   RBC 3.29 (L) 4.22 - 5.81 MIL/uL   Hemoglobin 10.2 (L) 13.0 - 17.0 g/dL   HCT 30.8 (L) 39 - 52 %   MCV 93.6 80.0 - 100.0 fL   MCH 31.0 26.0 - 34.0 pg   MCHC 33.1 30.0 - 36.0 g/dL   RDW 11.7 11.5 - 15.5 %   Platelets 436 (H) 150 - 400 K/uL   nRBC 0.0 0.0 - 0.2 %    Comment: Performed at [facility], [place] [address], [place], [place] [address]  Basic metabolic panel     Status: Abnormal   Collection Time: 05/14/20  6:03 AM  Result Value Ref Range   Sodium 136 135 - 145 mmol/L   Potassium 4.5 3.5 - 5.1 mmol/L   Chloride 102 98 - 111 mmol/L   CO2 25 22 - 32 mmol/L   Glucose, Bld 87 70 - 99 mg/dL    Comment: Glucose reference range applies only to samples taken after fasting for at least 8 hours.   BUN 19 8 - 23 mg/dL   Creatinine, Ser 1.31 (H) 0.61 - 1.24 mg/dL   Calcium 9.2 8.9 - 10.3 mg/dL   GFR, Estimated >60 >60 mL/min    Comment: (NOTE) Calculated  using the CKD-EPI Creatinine Equation (2021)    Anion gap 9 5 - 15    Comment: Performed at [facility], [place] [address], [place], [place] [address]  CBC WITH DIFFERENTIAL     Status: Abnormal   Collection Time: 05/18/20  4:00 AM  Result Value Ref Range   WBC 7.4 4.0 - 10.5 K/uL   RBC 3.25 (L) 4.22 - 5.81 MIL/uL   Hemoglobin 10.0 (L) 13.0 - 17.0 g/dL   HCT 30.7 (L) 39 - 52 %   MCV 94.5 80.0 - 100.0 fL   MCH 30.8 26.0 - 34.0 pg   MCHC 32.6 30.0 - 36.0 g/dL   RDW 11.9 11.5 - 15.5 %   Platelets 424 (H) 150 - 400 K/uL   nRBC 0.0 0.0 - 0.2 %   Neutrophils Relative % 68 %   Neutro Abs 5.0 1.7 - 7.7 K/uL   Lymphocytes Relative 20 %   Lymphs Abs 1.5 0.7 - 4.0 K/uL   Monocytes Relative 9 %    Monocytes Absolute 0.7 0.1 - 1.0 K/uL   Eosinophils Relative 2 %   Eosinophils Absolute 0.2 0.0 - 0.5 K/uL   Basophils Relative 0 %   Basophils Absolute 0.0 0.0 - 0.1 K/uL   Immature Granulocytes 1 %   Abs Immature Granulocytes 0.05 0.00 - 0.07 K/uL    Comment: Performed at [facility], [address], [place], [place] [address]  Comprehensive metabolic panel     Status: Abnormal   Collection Time: 05/18/20  4:00 AM  Result Value Ref Range   Sodium 135 135 - 145 mmol/L   Potassium 4.8 3.5 - 5.1 mmol/L   Chloride 99 98 - 111 mmol/L   CO2 23 22 - 32 mmol/L   Glucose, Bld 100 (H) 70 - 99 mg/dL    Comment: Glucose reference range applies only to samples taken after fasting for at least 8 hours.   BUN 37 (H) 8 - 23 mg/dL   Creatinine, Ser 1.92 (H) 0.61 - 1.24 mg/dL   Calcium 9.2 8.9 - 10.3 mg/dL   Total Protein 7.1 6.5 - 8.1 g/dL   Albumin 3.1 (L) 3.5 - 5.0 g/dL   AST 31 15 - 41 U/L   ALT 29 0 - 44 U/L   Alkaline Phosphatase 74 38 - 126 U/L   Total Bilirubin 0.5 0.3 - 1.2 mg/dL   GFR, Estimated 39 (L) >60 mL/min    Comment: (NOTE) Calculated using the CKD-EPI Creatinine Equation (2021)    Anion gap 13 5 - 15    Comment: Performed at [facility] [address] [place],  [address]  Basic metabolic panel     Status: Abnormal   Collection Time: 05/19/20 12:08 PM  Result Value Ref Range   Sodium 134 (L) 135 - 145 mmol/L   Potassium 4.7 3.5 - 5.1 mmol/L   Chloride 101 98 - 111 mmol/L   CO2 22 22 - 32 mmol/L   Glucose, Bld 98 70 - 99 mg/dL    Comment: Glucose reference range applies only to samples taken after fasting for at least 8 hours.   BUN 42 (H) 8 - 23 mg/dL   Creatinine, Ser 2.09 (H) 0.61 - 1.24 mg/dL   Calcium 8.8 (L) 8.9 - 10.3 mg/dL   GFR, Estimated 35 (L) >60 mL/min    Comment: (NOTE) Calculated using the CKD-EPI Creatinine Equation (2021)    Anion gap 11 5 - 15  Comment: Performed at [facility], [place] [address], [place], [place]  [address]  Basic metabolic panel     Status: Abnormal   Collection Time: 05/22/20  7:08 AM  Result Value Ref Range   Sodium 139 135 - 145 mmol/L   Potassium 5.4 (H) 3.5 - 5.1 mmol/L   Chloride 105 98 - 111 mmol/L   CO2 25 22 - 32 mmol/L   Glucose, Bld 107 (H) 70 - 99 mg/dL    Comment: Glucose reference range applies only to samples taken after fasting for at least 8 hours.   BUN 30 (H) 8 - 23 mg/dL   Creatinine, Ser 1.53 (H) 0.61 - 1.24 mg/dL   Calcium 9.4 8.9 - 10.3 mg/dL   GFR, Estimated 51 (L) >60 mL/min    Comment: (NOTE) Calculated using the CKD-EPI Creatinine Equation (2021)    Anion gap 9 5 - 15    Comment: Performed at [facility] [address] [place], [place] [address]  CBC     Status: Abnormal   Collection Time: 05/22/20  7:08 AM  Result Value Ref Range   WBC 6.0 4.0 - 10.5 K/uL   RBC 3.28 (L) 4.22 - 5.81 MIL/uL   Hemoglobin 10.1 (L) 13.0 - 17.0 g/dL   HCT 31.6 (L) 39 - 52 %   MCV 96.3 80.0 - 100.0 fL   MCH 30.8 26.0 - 34.0 pg   MCHC 32.0 30.0 - 36.0 g/dL   RDW 11.9 11.5 - 15.5 %   Platelets 424 (H) 150 - 400 K/uL   nRBC 0.0 0.0 - 0.2 %    Comment: Performed at [facility], [place] [address] [place], [place] [address]  Basic metabolic panel     Status: Abnormal   Collection Time: 05/23/20  7:14 AM  Result Value Ref Range   Sodium 136 135 - 145 mmol/L   Potassium 4.5 3.5 - 5.1 mmol/L   Chloride 102 98 - 111 mmol/L   CO2 22 22 - 32 mmol/L   Glucose, Bld 139 (H) 70 - 99 mg/dL    Comment: Glucose reference range applies only to samples taken after fasting for at least 8 hours.   BUN 29 (H) 8 - 23 mg/dL   Creatinine, Ser 1.43 (H) 0.61 - 1.24 mg/dL   Calcium 9.0 8.9 - 10.3 mg/dL   GFR, Estimated 55 (L) >60 mL/min    Comment: (NOTE) Calculated using the CKD-EPI Creatinine Equation (2021)    Anion gap 12 5 - 15    Comment: Performed at [facility] [address], [place], [place] [address]    PHQ2/9: Depression screen [facility] 2/9 05/29/2020  03/14/2020 11/12/2019 10/13/2019 06/29/2019  Decreased Interest 0 0 0 0 0  Down, Depressed, Hopeless 0 0 0 0 0  PHQ - 2 Score 0 0 0 0 0  Altered sleeping - - 0 0 0  Tired, decreased energy - - 0 0 0  Change in appetite - - 0 0 0  Feeling bad or failure about yourself  - - 0 0 0  Trouble concentrating - - 0 0 0  Moving slowly or fidgety/restless - - 0 0 0  Suicidal thoughts - - 0 0 0  PHQ-9 Score - - 0 0 0  Difficult doing work/chores - - Not difficult at all - Not difficult at all  Some recent data might be hidden    phq 9 is negative   Fall Risk: Fall  Risk  03/14/2020 11/12/2019 10/13/2019 06/29/2019 02/10/2019  Falls in the past year? 0 0 0 0 0  Number falls in past yr: 0 0 0 0 0  Injury with Fall? 0 0 0 0 0  Follow up - Falls evaluation completed - - -      Functional Status Survey: Is the patient deaf or have difficulty hearing?: Yes Does the patient have difficulty seeing, even when wearing glasses/contacts?: No Does the patient have difficulty concentrating, remembering, or making decisions?: No Does the patient have difficulty walking or climbing stairs?: No Does the patient have difficulty dressing or bathing?: No Does the patient have difficulty doing errands alone such as visiting a doctor's office or shopping?: Yes    Assessment & Plan  1. History of amputation below knee, right ([place])   2. Atherosclerosis of aorta ([place])   3. PAD (peripheral artery disease) ([place])   4. Hypertension, benign  - amLODipine-valsartan (EXFORGE) 5-160 MG tablet; Take 1 tablet by mouth daily.  Dispense: 90 tablet; Refill: 0  5. Chronic obstructive pulmonary disease, unspecified COPD type ([place])  - Fluticasone-Umeclidin-Vilant (TRELEGY ELLIPTA) 100-62.5-25 MCG/INH AEPB; Inhale 1 puff into the lungs daily as needed (shortness of breath).  Dispense: 60 each; Refill: 5  6. B12 deficiency  Continue supplementation  7. Enlarged thoracic aorta ([place])   8. Benign hypertension with chronic  kidney disease, stage III (H[facility])  - amLODipine-valsartan (EXFORGE) 5-160 MG tablet; Take 1 tablet by mouth daily.  Dispense: 90 tablet; Refill: 0  9. Stage 3a chronic kidney disease ([place])   10. Vitamin B1 deficiency   11. Other insomnia  - traZODone (DESYREL) 50 MG tablet; Take 0.5-1 tablets (25-50 mg total) by mouth at bedtime as needed for sleep.  Dispense: 30 tablet; Refill: 0  12. Phantom pain after amputation of lower extremity ([place])  Came in with his daughter, he is taking medication for pain. Discussed Lyrica but he prefers holding off for now

## 2020-05-29 NOTE — Telephone Encounter (Signed)
Patient advise.  

## 2020-06-05 ENCOUNTER — Encounter: Payer: 59 | Attending: Registered Nurse | Admitting: Registered Nurse

## 2020-06-05 ENCOUNTER — Encounter: Payer: Self-pay | Admitting: Registered Nurse

## 2020-06-05 ENCOUNTER — Other Ambulatory Visit: Payer: Self-pay

## 2020-06-05 VITALS — BP 110/74 | HR 77 | Temp 97.9°F | Ht 67.0 in

## 2020-06-05 DIAGNOSIS — I10 Essential (primary) hypertension: Secondary | ICD-10-CM

## 2020-06-05 DIAGNOSIS — G546 Phantom limb syndrome with pain: Secondary | ICD-10-CM | POA: Diagnosis not present

## 2020-06-05 DIAGNOSIS — I739 Peripheral vascular disease, unspecified: Secondary | ICD-10-CM

## 2020-06-05 DIAGNOSIS — S88111A Complete traumatic amputation at level between knee and ankle, right lower leg, initial encounter: Secondary | ICD-10-CM

## 2020-06-05 NOTE — Progress Notes (Signed)
Subjective:    Patient ID: Ryan [name], male    DOB: [birth year], 64 y.o.   MRN: [id number]  HPI: Ryan [name] is a 64 y.o. male who is here for Transitional Care Visit for follow up of his PAD, Right BKA, Phantom limb pain and Essential Hypertension. [name] was admitted to [facility] on 05/09/2020 for Right BKA. In the past he underwent multiple revascularization procedure with attempts at limb salvage, per discharge summary.  He underwent Right BKA:  By Dr [name] on 05/09/2020.       Waynetta Sandy, MD Primary     Procedure Laterality Anesthesia  RIGHT BELOW KNEE AMPUTATION Right      He was admitted to inpatient rehabilitation on 05/17/2020 and discharged home on 05/24/2020. He is receiving home health therapy from [facility]. He states he has pain in his right stump. He rates his pain 6. Also reports he has a good appetite.  [name] arrived in his wheelchair, he states he uses the walker in his home to go to the bathroom.   Daughter in room.   Pain Inventory Average Pain 8 Pain Right Now 6 My pain is constant, aching and BONE PAIN  In the last 24 hours, has pain interfered with the following? General activity 0 Relation with others 0 Enjoyment of life 0 What TIME of day is your pain at its worst? morning  and night Sleep (in general) Poor  Pain is worse with: NOTHING Pain improves with: NOTHING Relief from Meds: 7  how many minutes can you walk? 5 MINS ability to climb steps?  no do you drive?  no use a wheelchair  not employed: date last employed 04/2020 ON MEDICAL LEAVE I need assistance with the following:  No help needed Do you have any goals in this area?  yes  trouble walking  Any changes since last visit?  no  Any changes since last visit?  no    Family History  Problem Relation Age of Onset  . COPD Mother   . Hypertension Father   . Heart attack Brother    Social History   Socioeconomic History  .  Marital status: Single    Spouse name: [name]  . Number of children: 5  . Years of education: Not on file  . Highest education level: High school graduate  Occupational History  . Occupation: Forklift    Comment: [name]  Tobacco Use  . Smoking status: Former Smoker    Packs/day: 0.50    Years: 40.00    Pack years: 20.00    Types: Cigarettes    Start date: 07/21/1978    Quit date: 12/2018    Years since quitting: 1.4  . Smokeless tobacco: Never Used  Vaping Use  . Vaping Use: Never used  Substance and Sexual Activity  . Alcohol use: Yes    Alcohol/week: 12.0 standard drinks    Types: 12 Cans of beer per week    Comment: 4 beers / week now per pt 05/05/20  . Drug use: Never  . Sexual activity: Yes    Partners: Female    Birth control/protection: None  Other Topics Concern  . Not on file  Social History Narrative  . Not on file   Social Determinants of Health   Financial Resource Strain: Not on file  Food Insecurity: Not on file  Transportation Needs: Not on file  Physical Activity: Not on file  Stress: Not on file  Social Connections: Not on file   Past Surgical History:  Procedure Laterality Date  . ABDOMINAL AORTOGRAM W/LOWER EXTREMITY N/A 09/13/2019   Procedure: ABDOMINAL AORTOGRAM W/LOWER EXTREMITY;  Surgeon: Waynetta Sandy, MD;  Location: [facility];  Service: Cardiovascular;  Laterality: N/A;  . AMPUTATION Right 05/09/2020   Procedure: RIGHT BELOW KNEE AMPUTATION;  Surgeon: Waynetta Sandy, MD;  Location: [facility];  Service: Vascular;  Laterality: Right;  w/ a block  . COLONOSCOPY WITH PROPOFOL N/A 02/15/2016   Procedure: COLONOSCOPY WITH PROPOFOL;  Surgeon: Lucilla Lame, MD;  Location: [facility];  Service: Endoscopy;  Laterality: N/A;  . FEMORAL-POPLITEAL BYPASS GRAFT Right 09/14/2019   Procedure: BYPASS GRAFT FEMORAL-POPLITEAL ARTERY;  Surgeon: Waynetta Sandy, MD;  Location: [facility];  Service: Vascular;   Laterality: Right;  . HERNIA REPAIR  1999   left inguinal  . INSERTION OF ILIAC STENT Right 09/14/2019   Procedure: Insertion Of Common and External Iliac Stent;  Surgeon: Waynetta Sandy, MD;  Location: [facility];  Service: Vascular;  Laterality: Right;  . LOWER EXTREMITY ANGIOGRAPHY Right 05/07/2018   Procedure: LOWER EXTREMITY ANGIOGRAPHY;  Surgeon: Algernon Huxley, MD;  Location: [facility];  Service: Cardiovascular;  Laterality: Right;  . LOWER EXTREMITY ANGIOGRAPHY Right 06/25/2018   Procedure: LOWER EXTREMITY ANGIOGRAPHY;  Surgeon: Algernon Huxley, MD;  Location: [facility];  Service: Cardiovascular;  Laterality: Right;  . LOWER EXTREMITY ANGIOGRAPHY Right 07/01/2018   Procedure: LOWER EXTREMITY ANGIOGRAPHY;  Surgeon: Algernon Huxley, MD;  Location: [facility];  Service: Cardiovascular;  Laterality: Right;  . LOWER EXTREMITY ANGIOGRAPHY Right 08/12/2018   Procedure: LOWER EXTREMITY ANGIOGRAPHY;  Surgeon: Algernon Huxley, MD;  Location: [facility];  Service: Cardiovascular;  Laterality: Right;  . LOWER EXTREMITY ANGIOGRAPHY Right 09/03/2018   Procedure: LOWER EXTREMITY ANGIOGRAPHY;  Surgeon: Algernon Huxley, MD;  Location: [facility];  Service: Cardiovascular;  Laterality: Right;  . LOWER EXTREMITY ANGIOGRAPHY Right 09/04/2018   Procedure: Lower Extremity Angiography;  Surgeon: Algernon Huxley, MD;  Location: [facility];  Service: Cardiovascular;  Laterality: Right;  . LOWER EXTREMITY ANGIOGRAPHY Right 10/01/2018   Procedure: LOWER EXTREMITY ANGIOGRAPHY;  Surgeon: Algernon Huxley, MD;  Location: [facility];  Service: Cardiovascular;  Laterality: Right;  . LOWER EXTREMITY ANGIOGRAPHY Right 10/01/2018   Procedure: Lower Extremity Angiography;  Surgeon: Algernon Huxley, MD;  Location: [facility];  Service: Cardiovascular;  Laterality: Right;  . LOWER EXTREMITY ANGIOGRAPHY Right 10/15/2018   Procedure: LOWER EXTREMITY ANGIOGRAPHY;  Surgeon:  Algernon Huxley, MD;  Location: [facility];  Service: Cardiovascular;  Laterality: Right;  . LOWER EXTREMITY ANGIOGRAPHY Left 10/16/2018   Procedure: Lower Extremity Angiography;  Surgeon: Algernon Huxley, MD;  Location: [facility];  Service: Cardiovascular;  Laterality: Left;  . LOWER EXTREMITY ANGIOGRAPHY Left 01/20/2019   Procedure: LOWER EXTREMITY ANGIOGRAPHY;  Surgeon: Algernon Huxley, MD;  Location: [facility];  Service: Cardiovascular;  Laterality: Left;  . LOWER EXTREMITY ANGIOGRAPHY Right 01/21/2019   Procedure: Lower Extremity Angiography;  Surgeon: Algernon Huxley, MD;  Location: [facility];  Service: Cardiovascular;  Laterality: Right;  . LOWER EXTREMITY ANGIOGRAPHY Right 01/28/2019   Procedure: LOWER EXTREMITY ANGIOGRAPHY;  Surgeon: Algernon Huxley, MD;  Location: [facility];  Service: Cardiovascular;  Laterality: Right;  . LOWER EXTREMITY ANGIOGRAPHY Right 01/29/2019   Procedure: Lower Extremity Angiography;  Surgeon: Algernon Huxley, MD;  Location: [facility];  Service: Cardiovascular;  Laterality: Right;  . LOWER EXTREMITY ANGIOGRAPHY Right 04/04/2020   Procedure: LOWER EXTREMITY ANGIOGRAPHY;  Surgeon: Waynetta Sandy, MD;  Location: [facility];  Service: Cardiovascular;  Laterality: Right;  . LOWER EXTREMITY INTERVENTION Right 07/02/2018   Procedure: LOWER EXTREMITY INTERVENTION;  Surgeon: Algernon Huxley, MD;  Location: [facility];  Service: Cardiovascular;  Laterality: Right;  . PERIPHERAL VASCULAR BALLOON ANGIOPLASTY Left 04/04/2020   Procedure: PERIPHERAL VASCULAR BALLOON ANGIOPLASTY;  Surgeon: Waynetta Sandy, MD;  Location: [facility];  Service: Cardiovascular;  Laterality: Left;  external iliac  . POLYPECTOMY N/A 02/15/2016   Procedure: POLYPECTOMY;  Surgeon: Lucilla Lame, MD;  Location: [facility];  Service: Endoscopy;  Laterality: N/A;   Past Medical History:  Diagnosis Date  . CKD  (chronic kidney disease)   . History of blood transfusion   . Hyperlipidemia   . Hypertension   . Neuromuscular disorder (H[facility])    numbness feet  . Peripheral vascular disease ([place])   . Shortness of breath dyspnea    occasional    There were no vitals taken for this visit.  Opioid Risk Score:   Fall Risk Score:  `1  Depression screen PHQ 2/9  Depression screen [facility] 2/9 05/29/2020 03/14/2020 11/12/2019 10/13/2019 06/29/2019 04/09/2019 02/10/2019  Decreased Interest 0 0 0 0 0 0 0  Down, Depressed, Hopeless 0 0 0 0 0 0 0  PHQ - 2 Score 0 0 0 0 0 0 0  Altered sleeping - - 0 0 0 0 0  Tired, decreased energy - - 0 0 0 0 0  Change in appetite - - 0 0 0 0 0  Feeling bad or failure about yourself  - - 0 0 0 0 0  Trouble concentrating - - 0 0 0 0 0  Moving slowly or fidgety/restless - - 0 0 0 0 0  Suicidal thoughts - - 0 0 0 0 0  PHQ-9 Score - - 0 0 0 0 0  Difficult doing work/chores - - Not difficult at all - Not difficult at all - Not difficult at all  Some recent data might be hidden   Review of Systems  Musculoskeletal: Positive for gait problem.  Skin: Positive for wound.  All other systems reviewed and are negative.      Objective:   Physical Exam Vitals and nursing note reviewed.  Constitutional:      Appearance: Normal appearance.  Cardiovascular:     Rate and Rhythm: Normal rate and regular rhythm.     Pulses: Normal pulses.     Heart sounds: Normal heart sounds.  Pulmonary:     Effort: Pulmonary effort is normal.     Breath sounds: Normal breath sounds.  Musculoskeletal:     Cervical back: Normal range of motion and neck supple.     Comments: Normal Muscle Bulk and Muscle Testing Reveals:  Upper Extremities: Full ROM and Muscle Strength 5/5 Lower Extremities: Right : BKA: Staples Intact:  Site Cleansed : No Drainage noted. New Dressing applied Arrived in wheelchair.  Skin:    General: Skin is warm and dry.  Neurological:     Mental Status: He is alert and oriented  to person, place, and time.  Psychiatric:        Mood and Affect: Mood normal.        Behavior: Behavior normal.  Assessment & Plan:  1.PAD/ S/P Right BKA: Continue Home Health Therapy with [facility]. Vascular Following. 2. Phantom limb pain: Continue current medication regimen. Continue to Monitor.  3.Essential Hypertension: Continue current medication regimen. PCP Following.   F/U with Dr Ranell Patrick in 4- 6 weeks

## 2020-06-09 ENCOUNTER — Other Ambulatory Visit: Payer: Self-pay

## 2020-06-09 ENCOUNTER — Ambulatory Visit (INDEPENDENT_AMBULATORY_CARE_PROVIDER_SITE_OTHER): Payer: Self-pay | Admitting: Vascular Surgery

## 2020-06-09 ENCOUNTER — Encounter: Payer: Self-pay | Admitting: Vascular Surgery

## 2020-06-09 VITALS — BP 102/67 | HR 73 | Temp 98.8°F | Resp 20 | Ht 67.0 in | Wt 137.0 lb

## 2020-06-09 DIAGNOSIS — I739 Peripheral vascular disease, unspecified: Secondary | ICD-10-CM

## 2020-06-09 NOTE — Progress Notes (Signed)
Patient is having difficulty with "being in a daze" after taking Gabapentin 100 mg. Cannot cut medication in half since it is a capsule. Upon further investigation, patient was having side effects after taking first capsule at 8 am and second capsule at 12 pm. Sig is 1 capsule 3x a day. Advised patient to take one capsule at 8 am, 1 capsule at 4 pm and one capsule at bedtime and see if that helps. Patient verbalizes understanding.

## 2020-06-09 NOTE — Progress Notes (Signed)
    Subjective:     Patient ID: Ryan [name], male   DOB: 1955-07-19, 64 y.o.   MRN: [id number]  HPI 64 year old male status post right below-knee amputation.  He is now at home after spending a few weeks in rehab.  He is getting around well with the help of a walker.  His general pain is well controlled but he is having some neuropathic pain.  He is hoping to decrease his dose of Neurontin given fogginess that it is causing.   Review of Systems No complaints today    Objective:   Physical Exam Vitals:   06/09/20 0817  BP: 102/67  Pulse: 73  Resp: 20  Temp: 98.8 F (37.1 C)  SpO2: 95%   Awake alert oriented Right below-knee amputation well opposed.  Have the staples removed today.    Assessment:     64 year old male status post right below-knee amputation now doing well    Plan:     Have the staples removed today, he will follow-up in 2 weeks for complete staple removal We will refer for prosthetic at that time Pharmacy discussion regarding liquid Neurontin to decrease the dose.    Mordche Hedglin C. [name], MD [facility] Office: [phone] Pager: [phone]

## 2020-06-12 ENCOUNTER — Telehealth: Payer: Self-pay | Admitting: Registered Nurse

## 2020-06-12 MED ORDER — OXYCODONE-ACETAMINOPHEN 5-325 MG PO TABS: 1.0000 | ORAL_TABLET | Freq: Two times a day (BID) | ORAL | 0 refills | Status: DC | PRN

## 2020-06-12 NOTE — Telephone Encounter (Signed)
Ryan [name] daughter came to office stating Ryan [name] needed a refill on his oxycodone, she brought his medication bottle with her. . Ryan [name] on the phone stating he has been taking his oxycodone one tablet three times a day as needed for pain. He was instructed to decrease his Oxycodone to twice a day as needed for pain. He has an appointment  With Dr Ranell Patrick on 07/14/2019. He and daughter was instructed on slow weaning of Oxycodone, he and his daughter  verbalizes understanding. PMP was reviewed and Oxycodone e-scribed.

## 2020-06-13 ENCOUNTER — Telehealth: Payer: Self-pay

## 2020-06-13 ENCOUNTER — Ambulatory Visit: Payer: 59 | Admitting: Family Medicine

## 2020-06-13 NOTE — Telephone Encounter (Signed)
Patient concerned about taking Tylenol. Patient is currently on percocet, he says he is taking 2 a day. Advised patient that it is easy to overdo it on Tylenol since it is in many products, including his percocet. He is currently getting 500 mg per day. Explained he is under the limit of Tylenol milligrams that is generally acceptable to use per day, but to call and ask his pharmacist for a specific recommendation based on his health status.

## 2020-06-19 ENCOUNTER — Other Ambulatory Visit: Payer: Self-pay | Admitting: Physical Medicine and Rehabilitation

## 2020-06-19 ENCOUNTER — Other Ambulatory Visit: Payer: Self-pay | Admitting: Family Medicine

## 2020-06-19 DIAGNOSIS — I700 Atherosclerosis of aorta: Secondary | ICD-10-CM

## 2020-06-19 DIAGNOSIS — I739 Peripheral vascular disease, unspecified: Secondary | ICD-10-CM

## 2020-06-19 NOTE — Telephone Encounter (Signed)
pantoprazole (PROTONIX) 40 MG tablet    The original prescription was discontinued on 05/29/2020 by Alba Cory, MD for the following reason: Discontinued by provider. Renewing this prescription may not be appropriate.   Denied. Pt to contact PCP.

## 2020-06-21 ENCOUNTER — Other Ambulatory Visit: Payer: Self-pay | Admitting: Physical Medicine and Rehabilitation

## 2020-06-22 ENCOUNTER — Other Ambulatory Visit: Payer: Self-pay

## 2020-06-22 ENCOUNTER — Ambulatory Visit (INDEPENDENT_AMBULATORY_CARE_PROVIDER_SITE_OTHER): Payer: Self-pay | Admitting: Physician Assistant

## 2020-06-22 ENCOUNTER — Telehealth: Payer: Self-pay | Admitting: *Deleted

## 2020-06-22 ENCOUNTER — Encounter: Payer: Self-pay | Admitting: Physician Assistant

## 2020-06-22 VITALS — BP 181/92 | HR 89 | Temp 98.6°F | Resp 20 | Ht 67.0 in | Wt 137.0 lb

## 2020-06-22 DIAGNOSIS — I739 Peripheral vascular disease, unspecified: Secondary | ICD-10-CM

## 2020-06-22 NOTE — Telephone Encounter (Signed)
Geralyn PT [facility] called to ext PT 1wk4.  Approval given.

## 2020-06-22 NOTE — Progress Notes (Signed)
POST OPERATIVE OFFICE NOTE    CC:  F/u for surgery  HPI:  This is a 64 y.o. 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 [name].    Pt returns today for follow up.  He has a few residual staples in the right BKA.  He denise drainage, pain, fever and chills.      No Known Allergies  Current Outpatient Medications  Medication Sig Dispense Refill  . albuterol (VENTOLIN HFA) 108 (90 Base) MCG/ACT inhaler Inhale 2 puffs into the lungs every 6 (six) hours as needed for wheezing or shortness of breath. 18 g 0  . amLODipine-valsartan (EXFORGE) 5-160 MG tablet Take 1 tablet by mouth daily. 90 tablet 0  . ascorbic acid (VITAMIN C) 500 MG tablet Take 1 tablet (500 mg total) by mouth daily. 30 tablet 0  . aspirin EC 81 MG tablet Take 1 tablet (81 mg total) by mouth daily. 90 tablet 3  . atorvastatin (LIPITOR) 20 MG tablet Take 1 tablet by mouth once daily 90 tablet 0  . Cholecalciferol (DIALYVITE VITAMIN D 5000) 125 MCG (5000 UT) capsule Take 5,000 Units by mouth daily.    . clopidogrel (PLAVIX) 75 MG tablet Take 1 tablet (75 mg total) by mouth daily. 90 tablet 0  . Cyanocobalamin (B-12) 500 MCG SUBL Place 1 tablet under the tongue daily. (Patient taking differently: Place 500 mcg under the tongue daily.) 30 tablet 0  . docusate sodium (COLACE) 100 MG capsule Take 1 capsule (100 mg total) by mouth daily. 30 capsule 0  . DULoxetine (CYMBALTA) 60 MG capsule Take 1 capsule (60 mg total) by mouth daily. 90 capsule 1  . Fluticasone-Umeclidin-Vilant (TRELEGY ELLIPTA) 100-62.5-25 MCG/INH AEPB Inhale 1 puff into the lungs daily as needed (shortness of breath). 60 each 5  . Multiple Vitamin (MULTIVITAMIN WITH MINERALS) TABS tablet Take 1 tablet by mouth daily.    Marland Kitchen NARCAN 4 MG/0.1ML LIQD nasal spray kit 1 spray once.    Marland Kitchen oxyCODONE-acetaminophen (PERCOCET/ROXICET) 5-325 MG tablet Take 1 tablet by mouth 2 (two) times daily as needed for moderate pain. 60 tablet 0  . thiamine (VITAMIN B-1) 50 MG tablet Take 1  tablet (50 mg total) by mouth daily. 30 tablet 2  . traZODone (DESYREL) 50 MG tablet Take 0.5-1 tablets (25-50 mg total) by mouth at bedtime as needed for sleep. 30 tablet 0   No current facility-administered medications for this visit.     ROS:  See HPI  Physical Exam:    Incision:  Well healed with medial incision scab 2 cm long, no erythema or drainage.  Staples removed patient tolerated this well.  Well healed incision.   Assessment/Plan:  This is a 64 y.o. male who is s/p: right BKA 05/09/20 I reviewed the previous ABI on the left LE which is 1.01 and TBI 0.96.  I will refer him to [facility] for prothesis and he will f/u with [place] in 1 year for repeat ABI.  We reviewed the signs and symptoms of claudication.  If he develops these he will call our office.    The pt has a Below Knee Amputation The patient requires a prosthesis to improve their mobility and has the ability to function with the prosthesis to maintain a healthy lifestyle and perform ADLs. The patient is a new amputee and thus he does not have a prosthesis. The patient verbally communicates a strong desire to get a prosthesis. The patient is currently using a walker to hop around and  a wheelchair for mobility aids and is not expected to do so with the new prosthesis.  Patient is a functional K2 level or greater ambulator. They hop around their house with their walker, do single leg sit and stands, and is exhibiting strength, motivation, potential and ability to ambulate with a walking aid safely and independently.   Roxy Horseman PA-C [facility] [phone]  Clinic MD:  Oneida Alar

## 2020-06-28 ENCOUNTER — Telehealth: Payer: Self-pay | Admitting: Family Medicine

## 2020-06-28 NOTE — Telephone Encounter (Signed)
Pt called and stated that the sleeping medication he is currently prescribed is not helping much and his daughter advised him to call PCP and ask for a high dose or a different medication that is more effective /please advise

## 2020-06-29 ENCOUNTER — Other Ambulatory Visit: Payer: Self-pay | Admitting: Family Medicine

## 2020-06-29 DIAGNOSIS — G4709 Other insomnia: Secondary | ICD-10-CM

## 2020-06-29 NOTE — Telephone Encounter (Signed)
Pt states he is going to need a refill of the traZODone (DESYREL) 50 MG tablet  Pt has only 1/2 tab left.  [id number] - [place] ([place]), [place] - [address]

## 2020-06-30 NOTE — Telephone Encounter (Signed)
Spoke with patient and informed him that Dr. Ancil Boozer sent in his refill for trazodone and that per Dr. Ancil Boozer he can go up 1 pill and if he is not sleeping then he can go up to 1 pill and a half until his follow up with Dr. Ancil Boozer on 07/13/20.  Patient verbalized understanding.

## 2020-07-10 ENCOUNTER — Other Ambulatory Visit: Payer: Self-pay | Admitting: Physical Medicine and Rehabilitation

## 2020-07-12 NOTE — Progress Notes (Signed)
Name: Ryan [name]   MRN: [id number]    DOB: [birth year]   Date:07/13/2020       Progress Note  Subjective  Chief Complaint  Follow up - he came in with his daughter Ryan [name]   HPI    PAD:  Mr. Kunzman has a long history of PAD with claudication, he used to see Dr. Lucky Cowboy but asked for a second opinion . He was sent to Dr. 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 He is doing well. He has been having home PT a few times a week, off oxycodone, taking gabapentin and duloxetine and states phantom pain has been under control. He occasionally taking Tylenol . He is back on Plavix and aspirin and denies bleeding. Stump is healing well.   Left shoulder pain: going on for months, however getting worse sine BKA , he uses walker and upper body for transfer. He states aching pain when abducing arm, he has some PT bands at home but has not discussed exercising with PT yet, he has a PT visit tomorrow and advised him to ask for specific exercises for his shoulder   Insomnia: he is now taking Trazodone 75 mg at night and states sleep is good with higher dose. Denies side effects , needs a refill    Patient Active Problem List   Diagnosis Date Noted  . Phantom limb pain ([place])   . Essential hypertension   . AKI (acute kidney injury) ([place])   . Acute blood loss anemia   . Postoperative pain   . Below-knee amputation of right lower extremity ([place]) 05/17/2020  . Ischemia of right lower extremity 01/28/2019  . Iron deficiency anemia 11/23/2018  . Stage 3 chronic kidney disease ([place]) 10/21/2018  . Atherosclerosis of native arteries of extremity with rest pain ([place]) 10/13/2018  . PAD (peripheral artery disease) ([place]) 07/21/2018  . Ischemic leg 06/25/2018  . Claudication ([place]) 03/17/2018  . B12 deficiency 03/12/2018  . Vitamin B1 deficiency 03/12/2018  . Varicose  veins of leg with pain, right 03/12/2018  . Enlarged thoracic aorta ([place]) 02/24/2018  . Alcoholism /alcohol abuse 02/24/2018  . Paresthesia 02/24/2018  . Ectatic aorta ([place]) 02/20/2018  . Emphysema lung ([place]) 02/20/2018  . Atherosclerosis of aorta ([place]) 02/20/2018  . Encounter for tobacco use cessation counseling   . Benign neoplasm of cecum   . Benign neoplasm of transverse colon   . Benign neoplasm of sigmoid colon   . Hypertension, benign 08/31/2015  . Tobacco use 08/31/2015    Past Surgical History:  Procedure Laterality Date  . ABDOMINAL AORTOGRAM W/LOWER EXTREMITY N/A 09/13/2019   Procedure: ABDOMINAL AORTOGRAM W/LOWER EXTREMITY;  Surgeon: Waynetta Sandy, MD;  Location: [place] [facility];  Service: Cardiovascular;  Laterality: N/A;  . AMPUTATION Right 05/09/2020   Procedure: RIGHT BELOW KNEE AMPUTATION;  Surgeon: Waynetta Sandy, MD;  Location: [place];  Service: Vascular;  Laterality: Right;  w/ a block  . COLONOSCOPY WITH PROPOFOL N/A 02/15/2016   Procedure: COLONOSCOPY WITH PROPOFOL;  Surgeon: Lucilla Lame, MD;  Location: [place];  Service: Endoscopy;  Laterality: N/A;  . FEMORAL-POPLITEAL BYPASS GRAFT Right 09/14/2019   Procedure: BYPASS GRAFT FEMORAL-POPLITEAL ARTERY;  Surgeon: Waynetta Sandy, MD;  Location: [place];  Service: Vascular;  Laterality: Right;  . HERNIA REPAIR  1999   left inguinal  .  INSERTION OF ILIAC STENT Right 09/14/2019   Procedure: Insertion Of Common and External Iliac Stent;  Surgeon: Waynetta Sandy, MD;  Location: [place];  Service: Vascular;  Laterality: Right;  . LOWER EXTREMITY ANGIOGRAPHY Right 05/07/2018   Procedure: LOWER EXTREMITY ANGIOGRAPHY;  Surgeon: Algernon Huxley, MD;  Location: [place] [facility];  Service: Cardiovascular;  Laterality: Right;  . LOWER EXTREMITY ANGIOGRAPHY Right 06/25/2018   Procedure: LOWER EXTREMITY ANGIOGRAPHY;  Surgeon: Algernon Huxley, MD;  Location: [place] [facility];   Service: Cardiovascular;  Laterality: Right;  . LOWER EXTREMITY ANGIOGRAPHY Right 07/01/2018   Procedure: LOWER EXTREMITY ANGIOGRAPHY;  Surgeon: Algernon Huxley, MD;  Location: [place] [facility];  Service: Cardiovascular;  Laterality: Right;  . LOWER EXTREMITY ANGIOGRAPHY Right 08/12/2018   Procedure: LOWER EXTREMITY ANGIOGRAPHY;  Surgeon: Algernon Huxley, MD;  Location: [place] [facility];  Service: Cardiovascular;  Laterality: Right;  . LOWER EXTREMITY ANGIOGRAPHY Right 09/03/2018   Procedure: LOWER EXTREMITY ANGIOGRAPHY;  Surgeon: Algernon Huxley, MD;  Location: [place] [facility];  Service: Cardiovascular;  Laterality: Right;  . LOWER EXTREMITY ANGIOGRAPHY Right 09/04/2018   Procedure: Lower Extremity Angiography;  Surgeon: Algernon Huxley, MD;  Location: [place] [facility];  Service: Cardiovascular;  Laterality: Right;  . LOWER EXTREMITY ANGIOGRAPHY Right 10/01/2018   Procedure: LOWER EXTREMITY ANGIOGRAPHY;  Surgeon: Algernon Huxley, MD;  Location: [place] [facility];  Service: Cardiovascular;  Laterality: Right;  . LOWER EXTREMITY ANGIOGRAPHY Right 10/01/2018   Procedure: Lower Extremity Angiography;  Surgeon: Algernon Huxley, MD;  Location: [place] [facility];  Service: Cardiovascular;  Laterality: Right;  . LOWER EXTREMITY ANGIOGRAPHY Right 10/15/2018   Procedure: LOWER EXTREMITY ANGIOGRAPHY;  Surgeon: Algernon Huxley, MD;  Location: [place] [facility];  Service: Cardiovascular;  Laterality: Right;  . LOWER EXTREMITY ANGIOGRAPHY Left 10/16/2018   Procedure: Lower Extremity Angiography;  Surgeon: Algernon Huxley, MD;  Location: [place] [facility];  Service: Cardiovascular;  Laterality: Left;  . LOWER EXTREMITY ANGIOGRAPHY Left 01/20/2019   Procedure: LOWER EXTREMITY ANGIOGRAPHY;  Surgeon: Algernon Huxley, MD;  Location: [place] [facility];  Service: Cardiovascular;  Laterality: Left;  . LOWER EXTREMITY ANGIOGRAPHY Right 01/21/2019   Procedure: Lower Extremity Angiography;  Surgeon: Algernon Huxley, MD;  Location:  [place] [facility];  Service: Cardiovascular;  Laterality: Right;  . LOWER EXTREMITY ANGIOGRAPHY Right 01/28/2019   Procedure: LOWER EXTREMITY ANGIOGRAPHY;  Surgeon: Algernon Huxley, MD;  Location: [place] [facility];  Service: Cardiovascular;  Laterality: Right;  . LOWER EXTREMITY ANGIOGRAPHY Right 01/29/2019   Procedure: Lower Extremity Angiography;  Surgeon: Algernon Huxley, MD;  Location: [place] [facility];  Service: Cardiovascular;  Laterality: Right;  . LOWER EXTREMITY ANGIOGRAPHY Right 04/04/2020   Procedure: LOWER EXTREMITY ANGIOGRAPHY;  Surgeon: Waynetta Sandy, MD;  Location: [place] [facility];  Service: Cardiovascular;  Laterality: Right;  . LOWER EXTREMITY INTERVENTION Right 07/02/2018   Procedure: LOWER EXTREMITY INTERVENTION;  Surgeon: Algernon Huxley, MD;  Location: [place] [facility];  Service: Cardiovascular;  Laterality: Right;  . PERIPHERAL VASCULAR BALLOON ANGIOPLASTY Left 04/04/2020   Procedure: PERIPHERAL VASCULAR BALLOON ANGIOPLASTY;  Surgeon: Waynetta Sandy, MD;  Location: [place] [facility];  Service: Cardiovascular;  Laterality: Left;  external iliac  . POLYPECTOMY N/A 02/15/2016   Procedure: POLYPECTOMY;  Surgeon: Lucilla Lame, MD;  Location: [place];  Service: Endoscopy;  Laterality: N/A;    Family History  Problem Relation Age  of Onset  . COPD Mother   . Hypertension Father   . Heart attack Brother     Social History   Tobacco Use  . Smoking status: Former Smoker    Packs/day: 0.50    Years: 40.00    Pack years: 20.00    Types: Cigarettes    Start date: 07/21/1978    Quit date: 12/2018    Years since quitting: 1.5  . Smokeless tobacco: Never Used  Substance Use Topics  . Alcohol use: Not Currently    Alcohol/week: 12.0 standard drinks    Types: 12 Cans of beer per week    Comment: 4 beers / week now per pt 05/05/20     Current Outpatient Medications:  .  albuterol (VENTOLIN HFA) 108 (90 Base) MCG/ACT inhaler, Inhale 2  puffs into the lungs every 6 (six) hours as needed for wheezing or shortness of breath., Disp: 18 g, Rfl: 0 .  amLODipine-valsartan (EXFORGE) 5-160 MG tablet, Take 1 tablet by mouth daily., Disp: 90 tablet, Rfl: 0 .  ascorbic acid (VITAMIN C) 500 MG tablet, Take 1 tablet (500 mg total) by mouth daily., Disp: 30 tablet, Rfl: 0 .  aspirin EC 81 MG tablet, Take 1 tablet (81 mg total) by mouth daily., Disp: 90 tablet, Rfl: 3 .  atorvastatin (LIPITOR) 20 MG tablet, Take 1 tablet by mouth once daily, Disp: 90 tablet, Rfl: 0 .  Cholecalciferol (DIALYVITE VITAMIN D 5000) 125 MCG (5000 UT) capsule, Take 5,000 Units by mouth daily., Disp: , Rfl:  .  clopidogrel (PLAVIX) 75 MG tablet, Take 1 tablet (75 mg total) by mouth daily., Disp: 90 tablet, Rfl: 0 .  Cyanocobalamin (B-12) 500 MCG SUBL, Place 1 tablet under the tongue daily. (Patient taking differently: Place 500 mcg under the tongue daily.), Disp: 30 tablet, Rfl: 0 .  DULoxetine (CYMBALTA) 60 MG capsule, Take 1 capsule (60 mg total) by mouth daily., Disp: 90 capsule, Rfl: 1 .  Fluticasone-Umeclidin-Vilant (TRELEGY ELLIPTA) 100-62.5-25 MCG/INH AEPB, Inhale 1 puff into the lungs daily as needed (shortness of breath)., Disp: 60 each, Rfl: 5 .  gabapentin (NEURONTIN) 100 MG capsule, Take 1 capsule (100 mg total) by mouth 3 (three) times daily., Disp: 270 capsule, Rfl: 0 .  Multiple Vitamin (MULTIVITAMIN WITH MINERALS) TABS tablet, Take 1 tablet by mouth daily., Disp: , Rfl:  .  pantoprazole (PROTONIX) 40 MG tablet, Take 1 tablet by mouth once daily, Disp: 30 tablet, Rfl: 0 .  thiamine (VITAMIN B-1) 50 MG tablet, Take 1 tablet (50 mg total) by mouth daily., Disp: 30 tablet, Rfl: 2 .  traZODone (DESYREL) 50 MG tablet, Take 1.5 tablets (75 mg total) by mouth at bedtime., Disp: 135 tablet, Rfl: 0  No Known Allergies  I personally reviewed active problem list, medication list, allergies, family history, social history with the patient/caregiver  today.   ROS  Constitutional: Negative for fever or weight change.  Respiratory: Negative for cough and shortness of breath.   Cardiovascular: Negative for chest pain or palpitations.  Gastrointestinal: Negative for abdominal pain, no bowel changes.  Musculoskeletal:positive for gait problem or joint swelling.  Skin: Negative for rash.  Neurological: Negative for dizziness or headache.  No other specific complaints in a complete review of systems (except as listed in HPI above).  Objective  Vitals:   07/13/20 0938  BP: 128/78  Pulse: 98  Resp: 16  Temp: 97.7 F (36.5 C)  TempSrc: Oral  SpO2: 100%  Weight: 139 lb (63 kg)  Height:  5\' 7"  (1.702 m)    Body mass index is 21.77 kg/m.  Physical Exam  Constitutional: Patient appears well-developed and well-nourished.  No distress.  HEENT: head atraumatic, normocephalic, pupils equal and reactive to light,  neck supple Cardiovascular: Normal rate, regular rhythm and normal heart sounds.  No murmur heard.  Some edema below BKI , healing stump  Pulmonary/Chest: Effort normal and breath sounds normal. No respiratory distress. Abdominal: Soft.  There is no tenderness. Muscular skeletal: BKA right side, stump in good aspect, small area of oozing under scab, serous drainage, got stuck on sock, advised to use vaseline and keep a check for infection.  Psychiatric: Patient has a normal mood and affect. behavior is normal. Judgment and thought content normal.   PHQ2/9: Depression screen [facility] 2/9 07/13/2020 06/05/2020 05/29/2020 03/14/2020 11/12/2019  Decreased Interest 0 0 0 0 0  Down, Depressed, Hopeless 0 0 0 0 0  PHQ - 2 Score 0 0 0 0 0  Altered sleeping - 1 - - 0  Tired, decreased energy - 0 - - 0  Change in appetite - 0 - - 0  Feeling bad or failure about yourself  - 0 - - 0  Trouble concentrating - 0 - - 0  Moving slowly or fidgety/restless - 0 - - 0  Suicidal thoughts - 0 - - 0  PHQ-9 Score - 1 - - 0  Difficult doing  work/chores - - - - Not difficult at all  Some recent data might be hidden    phq 9 is negative   Fall Risk: Fall Risk  07/13/2020 06/05/2020 03/14/2020 11/12/2019 10/13/2019  Falls in the past year? 0 0 0 0 0  Number falls in past yr: 0 0 0 0 0  Injury with Fall? 0 0 0 0 0  Follow up - - - Falls evaluation completed -    Functional Status Survey: Is the patient deaf or have difficulty hearing?: Yes Does the patient have difficulty seeing, even when wearing glasses/contacts?: No Does the patient have difficulty concentrating, remembering, or making decisions?: No Does the patient have difficulty walking or climbing stairs?: Yes Does the patient have difficulty dressing or bathing?: No Does the patient have difficulty doing errands alone such as visiting a doctor's office or shopping?: No    Assessment & Plan  1. PAD (peripheral artery disease) ([place])   2. History of amputation below knee, right (HCC)   3. Other insomnia  - traZODone (DESYREL) 50 MG tablet; Take 1.5 tablets (75 mg total) by mouth at bedtime.  Dispense: 135 tablet; Refill: 0  4. Phantom pain after amputation of lower extremity (HCC)  - gabapentin (NEURONTIN) 100 MG capsule; Take 1 capsule (100 mg total) by mouth 3 (three) times daily.  Dispense: 270 capsule; Refill: 0  5. Need for shingles vaccine  - Varicella-zoster vaccine IM

## 2020-07-13 ENCOUNTER — Other Ambulatory Visit: Payer: Self-pay

## 2020-07-13 ENCOUNTER — Ambulatory Visit (INDEPENDENT_AMBULATORY_CARE_PROVIDER_SITE_OTHER): Payer: 59 | Admitting: Family Medicine

## 2020-07-13 ENCOUNTER — Encounter: Payer: Self-pay | Admitting: Family Medicine

## 2020-07-13 ENCOUNTER — Ambulatory Visit: Payer: 59 | Admitting: Physical Medicine and Rehabilitation

## 2020-07-13 VITALS — BP 128/78 | HR 98 | Temp 97.7°F | Resp 16 | Ht 67.0 in | Wt 139.0 lb

## 2020-07-13 DIAGNOSIS — I739 Peripheral vascular disease, unspecified: Secondary | ICD-10-CM

## 2020-07-13 DIAGNOSIS — G546 Phantom limb syndrome with pain: Secondary | ICD-10-CM

## 2020-07-13 DIAGNOSIS — G4709 Other insomnia: Secondary | ICD-10-CM | POA: Diagnosis not present

## 2020-07-13 DIAGNOSIS — Z89511 Acquired absence of right leg below knee: Secondary | ICD-10-CM

## 2020-07-13 DIAGNOSIS — Z23 Encounter for immunization: Secondary | ICD-10-CM | POA: Diagnosis not present

## 2020-07-13 MED ORDER — GABAPENTIN 100 MG PO CAPS: 100.0000 mg | ORAL_CAPSULE | Freq: Three times a day (TID) | ORAL | 0 refills | Status: DC

## 2020-07-13 MED ORDER — TRAZODONE HCL 50 MG PO TABS: 75.0000 mg | ORAL_TABLET | Freq: Every day | ORAL | 0 refills | Status: DC

## 2020-07-20 ENCOUNTER — Telehealth: Payer: Self-pay

## 2020-07-20 NOTE — Telephone Encounter (Signed)
Geral (?) from [facility] called needs order for recert of PT-- 1 x week x 1 week//2 x week for 4 weeks//1 x week x 4 weeks her number [phone]

## 2020-07-21 NOTE — Telephone Encounter (Signed)
Felipa Furnace, PT/[facility] notified.

## 2020-07-21 NOTE — Telephone Encounter (Signed)
It's probably fine to extend orders, but I've never seen this man before. Dr. Ranell Patrick saw him in the hospital.

## 2020-07-26 ENCOUNTER — Other Ambulatory Visit: Payer: Self-pay | Admitting: Family Medicine

## 2020-07-27 ENCOUNTER — Encounter: Payer: 59 | Attending: Registered Nurse | Admitting: Physical Medicine and Rehabilitation

## 2020-07-27 DIAGNOSIS — S88111A Complete traumatic amputation at level between knee and ankle, right lower leg, initial encounter: Secondary | ICD-10-CM | POA: Insufficient documentation

## 2020-07-27 DIAGNOSIS — I10 Essential (primary) hypertension: Secondary | ICD-10-CM | POA: Insufficient documentation

## 2020-07-27 DIAGNOSIS — G546 Phantom limb syndrome with pain: Secondary | ICD-10-CM | POA: Insufficient documentation

## 2020-07-27 DIAGNOSIS — I739 Peripheral vascular disease, unspecified: Secondary | ICD-10-CM | POA: Insufficient documentation

## 2020-08-08 ENCOUNTER — Telehealth: Payer: Self-pay

## 2020-08-08 ENCOUNTER — Other Ambulatory Visit: Payer: Self-pay

## 2020-08-08 DIAGNOSIS — I739 Peripheral vascular disease, unspecified: Secondary | ICD-10-CM

## 2020-08-08 IMAGING — XA DG NERVE ROOT BLOCK L/S EACH ADD'L
2 series · 2 of 2 positions shown · non-contrast
Comparison: none

CLINICAL DATA: Lumbosacral spondylosis without myelopathy, most
marked L4-5. Left lower extremity pain.

EXAM:
LEFT L3 SELECTIVE NERVE ROOT BLOCK AND TRANSFORAMINAL EPIDURAL
STEROID INJECTION UNDER FLUOROSCOPY
LEFT L4 SELECTIVE NERVE ROOT BLOCK AND TRANSFORAMINAL EPIDURAL
FLUOROSCOPY TIME:  31 seconds; 29 uTymZ DAP
TECHNIQUE: The procedure, risks (including but not limited to bleeding,
infection, organ damage ), benefits, and alternatives were explained
to the patient. Questions regarding the procedure were encouraged
and answered. The patient understands and consents to the procedure.

[Series 1: ortho standard · 1 of 1 slices shown (1 of 2)]
[im 1/1]
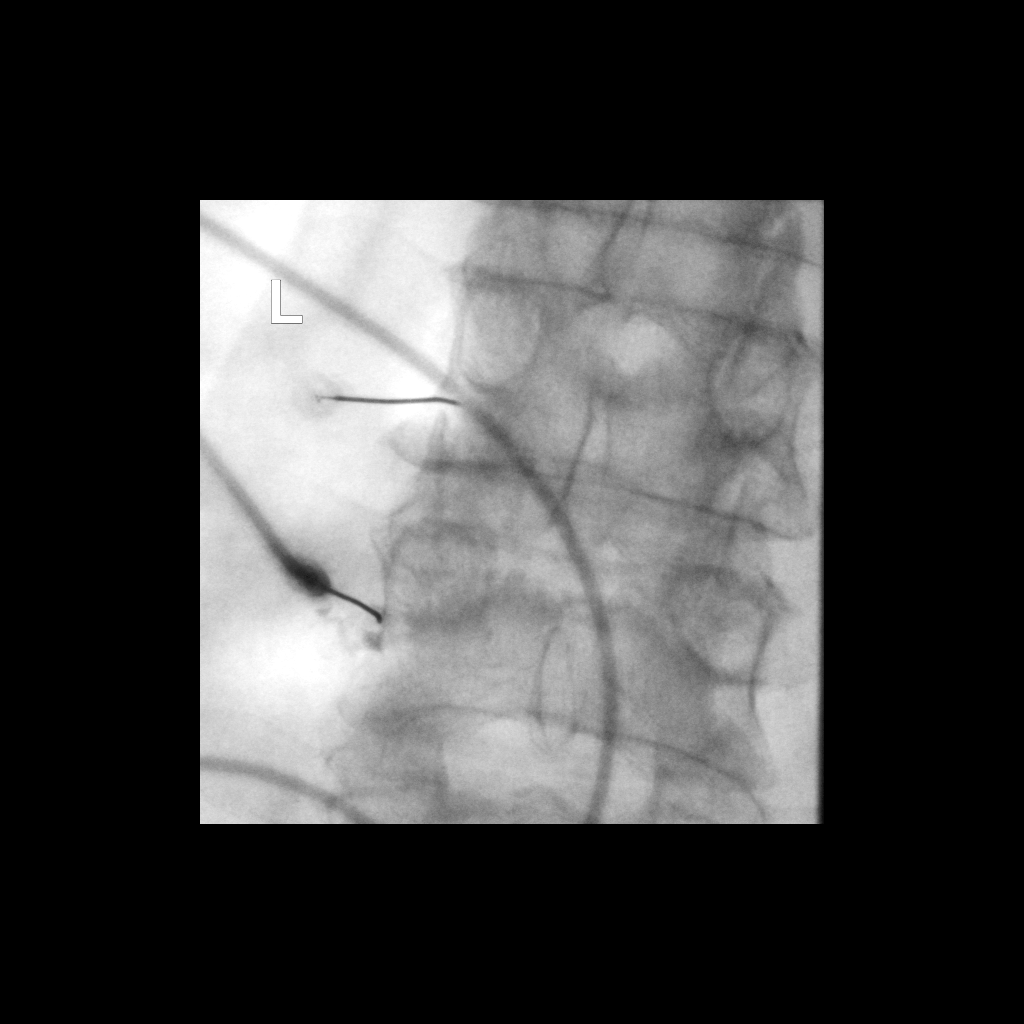

[Series 2: ortho standard · 1 of 1 slices shown (2 of 2)]
[im 1/1]
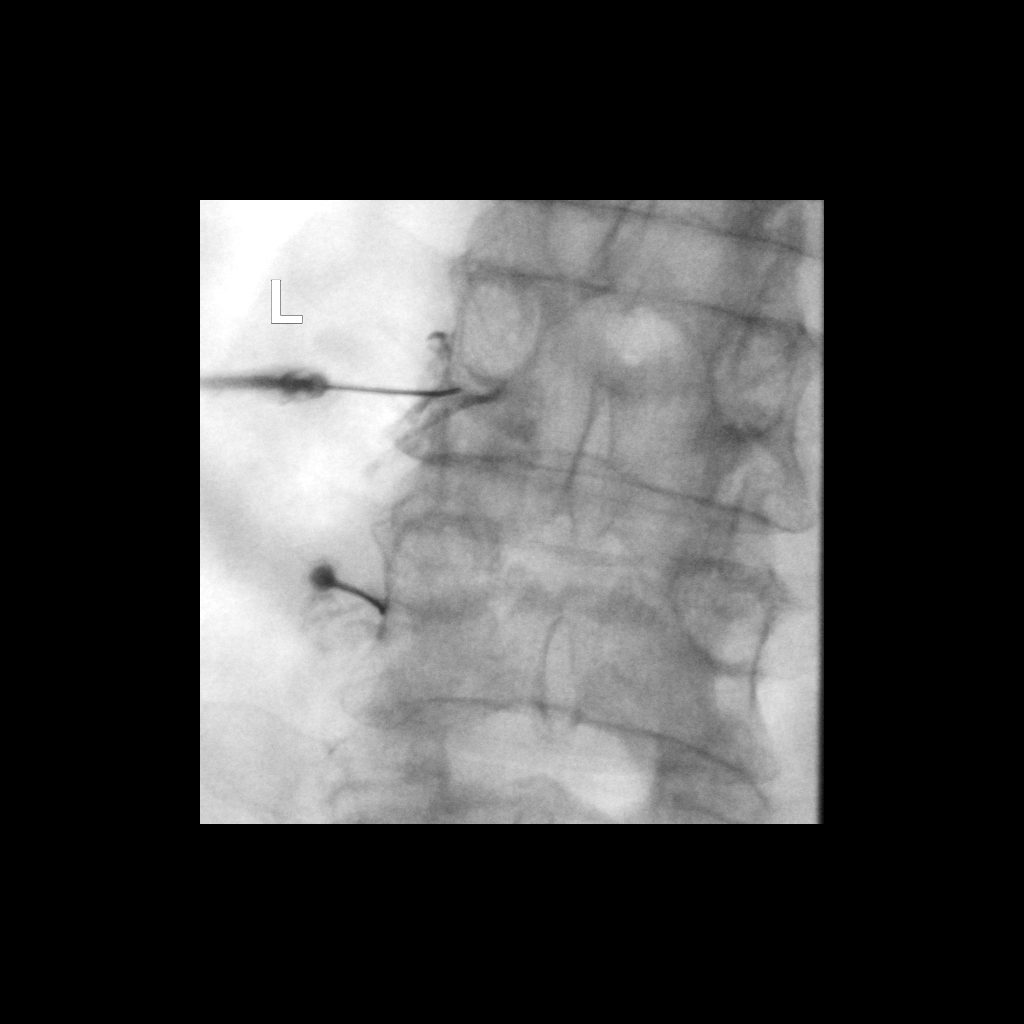

[2 of 2 positions shown; findings below may reference images not displayed]

Appropriate skin entry sites determined under fluoroscopy. Operator
donned sterile gloves and mask. Site was marked, prepped with
Betadine, draped in usual sterile fashion, infiltrated locally with
1% lidocaine. A 22 gauge spinal needle was advanced to the superior
ventral margin of the left L4-5 neural foramen.

A 22 gauge spinal needle was advanced to the superior ventral margin
of the left L3-4 neural foramen. Diagnostic injection of 2 ml
Omnipaque 180 showed partial outlining of the exiting nerve root at
each level, as well as epidural extension of contrast, with no
intravascular or subarachnoid component. 120 mg Depo-Medrol in 3 ml
lidocaine 1% was divided between the 2 sites. The patient tolerated
procedure well, with no immediate complication.
IMPRESSION: 1. Technically successful left L3 selective nerve root block and
transforaminal epidural steroid injection.
2. Technically successful left L4 selective nerve root block and
transforaminal epidural steroid injection.

## 2020-08-08 NOTE — Telephone Encounter (Signed)
I called patient to ask which [place] location he wanted to go to for physical therapy and prosthetic gait training.  He wants to go to the one at [address]  I assured him that we sent his referral over.  I spoke to a Representative at the [facility] location that confirmed receipt of the referral and will contact the pt shortly.

## 2020-08-14 ENCOUNTER — Other Ambulatory Visit: Payer: Self-pay

## 2020-08-14 ENCOUNTER — Encounter: Payer: Self-pay | Admitting: Physical Therapy

## 2020-08-14 ENCOUNTER — Ambulatory Visit: Payer: 59 | Admitting: Physical Therapy

## 2020-08-14 DIAGNOSIS — R2689 Other abnormalities of gait and mobility: Secondary | ICD-10-CM | POA: Diagnosis not present

## 2020-08-14 DIAGNOSIS — M6281 Muscle weakness (generalized): Secondary | ICD-10-CM

## 2020-08-14 DIAGNOSIS — R293 Abnormal posture: Secondary | ICD-10-CM | POA: Diagnosis not present

## 2020-08-14 DIAGNOSIS — R2681 Unsteadiness on feet: Secondary | ICD-10-CM | POA: Diagnosis not present

## 2020-08-14 NOTE — Therapy (Signed)
[facility] [address] [place], [place], [address] Phone: [phone]   Fax:  [phone]  Physical Therapy Evaluation  Patient Details  Name: Ryan [name] MRN: [id number] Date of Birth: May 22, 1956 Referring Provider (PT): Waynetta Sandy, MD   Encounter Date: 08/14/2020   PT End of Session - 08/14/20 1017    Visit Number 1    Number of Visits 20    Date for PT Re-Evaluation 10/19/20    Authorization Type [employer]    Authorization Time Period 20% coinsurance, $1500 OOP max not met    PT Start Time 0845    PT Stop Time 0935    PT Time Calculation (min) 50 min    Equipment Utilized During Treatment Gait belt    Activity Tolerance Patient tolerated treatment well    Behavior During Therapy [facility] for tasks assessed/performed           Past Medical History:  Diagnosis Date  . CKD (chronic kidney disease)   . History of blood transfusion   . Hyperlipidemia   . Hypertension   . Neuromuscular disorder (HCC)    numbness feet  . Peripheral vascular disease ([place])   . Shortness of breath dyspnea    occasional     Past Surgical History:  Procedure Laterality Date  . ABDOMINAL AORTOGRAM W/LOWER EXTREMITY N/A 09/13/2019   Procedure: ABDOMINAL AORTOGRAM W/LOWER EXTREMITY;  Surgeon: Waynetta Sandy, MD;  Location: [place] [facility];  Service: Cardiovascular;  Laterality: N/A;  . AMPUTATION Right 05/09/2020   Procedure: RIGHT BELOW KNEE AMPUTATION;  Surgeon: Waynetta Sandy, MD;  Location: [place];  Service: Vascular;  Laterality: Right;  w/ a block  . COLONOSCOPY WITH PROPOFOL N/A 02/15/2016   Procedure: COLONOSCOPY WITH PROPOFOL;  Surgeon: Lucilla Lame, MD;  Location: [place];  Service: Endoscopy;  Laterality: N/A;  . FEMORAL-POPLITEAL BYPASS GRAFT Right 09/14/2019   Procedure: BYPASS GRAFT FEMORAL-POPLITEAL ARTERY;  Surgeon: Waynetta Sandy, MD;  Location: [place];  Service: Vascular;   Laterality: Right;  . HERNIA REPAIR  1999   left inguinal  . INSERTION OF ILIAC STENT Right 09/14/2019   Procedure: Insertion Of Common and External Iliac Stent;  Surgeon: Waynetta Sandy, MD;  Location: [place];  Service: Vascular;  Laterality: Right;  . LOWER EXTREMITY ANGIOGRAPHY Right 05/07/2018   Procedure: LOWER EXTREMITY ANGIOGRAPHY;  Surgeon: Algernon Huxley, MD;  Location: [place] [facility];  Service: Cardiovascular;  Laterality: Right;  . LOWER EXTREMITY ANGIOGRAPHY Right 06/25/2018   Procedure: LOWER EXTREMITY ANGIOGRAPHY;  Surgeon: Algernon Huxley, MD;  Location: [place] [facility];  Service: Cardiovascular;  Laterality: Right;  . LOWER EXTREMITY ANGIOGRAPHY Right 07/01/2018   Procedure: LOWER EXTREMITY ANGIOGRAPHY;  Surgeon: Algernon Huxley, MD;  Location: [place] [facility];  Service: Cardiovascular;  Laterality: Right;  . LOWER EXTREMITY ANGIOGRAPHY Right 08/12/2018   Procedure: LOWER EXTREMITY ANGIOGRAPHY;  Surgeon: Algernon Huxley, MD;  Location: [place] [facility];  Service: Cardiovascular;  Laterality: Right;  . LOWER EXTREMITY ANGIOGRAPHY Right 09/03/2018   Procedure: LOWER EXTREMITY ANGIOGRAPHY;  Surgeon: Algernon Huxley, MD;  Location: [place] [facility];  Service: Cardiovascular;  Laterality: Right;  . LOWER EXTREMITY ANGIOGRAPHY Right 09/04/2018   Procedure: Lower Extremity Angiography;  Surgeon: Algernon Huxley, MD;  Location: [place] [facility];  Service: Cardiovascular;  Laterality: Right;  . LOWER EXTREMITY ANGIOGRAPHY Right 10/01/2018   Procedure: LOWER EXTREMITY ANGIOGRAPHY;  Surgeon: Algernon Huxley, MD;  Location: [place] [facility];  Service: Cardiovascular;  Laterality: Right;  . LOWER EXTREMITY ANGIOGRAPHY Right 10/01/2018   Procedure: Lower Extremity Angiography;  Surgeon: Algernon Huxley, MD;  Location: [place] [facility];  Service: Cardiovascular;  Laterality: Right;  . LOWER EXTREMITY ANGIOGRAPHY Right 10/15/2018   Procedure: LOWER EXTREMITY ANGIOGRAPHY;  Surgeon:  Algernon Huxley, MD;  Location: [place] [facility];  Service: Cardiovascular;  Laterality: Right;  . LOWER EXTREMITY ANGIOGRAPHY Left 10/16/2018   Procedure: Lower Extremity Angiography;  Surgeon: Algernon Huxley, MD;  Location: [place] [facility];  Service: Cardiovascular;  Laterality: Left;  . LOWER EXTREMITY ANGIOGRAPHY Left 01/20/2019   Procedure: LOWER EXTREMITY ANGIOGRAPHY;  Surgeon: Algernon Huxley, MD;  Location: [place] [facility];  Service: Cardiovascular;  Laterality: Left;  . LOWER EXTREMITY ANGIOGRAPHY Right 01/21/2019   Procedure: Lower Extremity Angiography;  Surgeon: Algernon Huxley, MD;  Location: [place] [facility];  Service: Cardiovascular;  Laterality: Right;  . LOWER EXTREMITY ANGIOGRAPHY Right 01/28/2019   Procedure: LOWER EXTREMITY ANGIOGRAPHY;  Surgeon: Algernon Huxley, MD;  Location: [place] [facility];  Service: Cardiovascular;  Laterality: Right;  . LOWER EXTREMITY ANGIOGRAPHY Right 01/29/2019   Procedure: Lower Extremity Angiography;  Surgeon: Algernon Huxley, MD;  Location: [place] [facility];  Service: Cardiovascular;  Laterality: Right;  . LOWER EXTREMITY ANGIOGRAPHY Right 04/04/2020   Procedure: LOWER EXTREMITY ANGIOGRAPHY;  Surgeon: Waynetta Sandy, MD;  Location: [place] [facility];  Service: Cardiovascular;  Laterality: Right;  . LOWER EXTREMITY INTERVENTION Right 07/02/2018   Procedure: LOWER EXTREMITY INTERVENTION;  Surgeon: Algernon Huxley, MD;  Location: [place] [facility];  Service: Cardiovascular;  Laterality: Right;  . PERIPHERAL VASCULAR BALLOON ANGIOPLASTY Left 04/04/2020   Procedure: PERIPHERAL VASCULAR BALLOON ANGIOPLASTY;  Surgeon: Waynetta Sandy, MD;  Location: [place] [facility];  Service: Cardiovascular;  Laterality: Left;  external iliac  . POLYPECTOMY N/A 02/15/2016   Procedure: POLYPECTOMY;  Surgeon: Lucilla Lame, MD;  Location: [place];  Service: Endoscopy;  Laterality: N/A;    There were no vitals filed for this visit.     Subjective Assessment - 08/14/20 0851    Subjective This 65yo male was referred to PT by Waynetta Sandy, MD with I73.9 (ICD-10-CM) - PAD (peripheral artery disease.  He underwent a right Transtibial Amputation on 05/09/2020 due to failed Fem-Pop Bypass. He recieved his first prosthesis on 08/07/2020.    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 Yes    Pain Score 4     Pain Location Leg    Pain Orientation Right    Pain Descriptors / Indicators Pressure    Pain Type Phantom pain    Pain Onset More than a month ago    Pain Frequency Intermittent    Aggravating Factors  wearing prosthesis    Pain Relieving Factors removing prosthesis    Effect of Pain on Daily Activities still wears & walks              [facility] PT Assessment - 08/14/20 0845      Assessment   Medical Diagnosis RIght Transtibial Amputaion    Referring Provider (PT) Waynetta Sandy, MD  Onset Date/Surgical Date 08/07/20    Hand Dominance Right    Prior Therapy inpatient rehab, HHPT until 2/18      Precautions   Precautions Fall      Balance Screen   Has the patient fallen in the past 6 months No    Has the patient had a decrease in activity level because of a fear of falling?  No    Is the patient reluctant to leave their home because of a fear of falling?  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#, driving forklift    Leisure music, dancing, basketball      Posture/Postural Control   Posture/Postural Control Postural limitations    Postural Limitations Weight shift left;Forward head      AROM   Overall AROM  Within functional limits for tasks performed      Strength   Overall Strength Within functional limits for tasks performed      Transfers   Transfers Sit to Stand;Stand to Sit    Sit to Stand 5: Supervision;With upper extremity assist;With armrests;From chair/3-in-1    Stand to Sit 5: Supervision;With upper extremity assist;With armrests;To chair/3-in-1      Ambulation/Gait   Ambulation/Gait Yes    Ambulation/Gait Assistance 5: Supervision    Ambulation/Gait Assistance Details excessive UE weight bearing on RW, partial weight on prosthesis, HR 85 SpO2 98%    Ambulation Distance (Feet) 100 Feet    Assistive device Rolling walker;Prosthesis    Gait Pattern Step-to pattern;Decreased step length - left;Decreased stance time - right;Decreased hip/knee flexion - right;Decreased weight shift to right;Right hip hike;Right circumduction;Right flexed knee in stance;Antalgic;Trunk flexed;Abducted- right    Ambulation Surface Level;Indoor    Gait velocity 1. 60 ft/sec      Standardized Balance Assessment   Standardized Balance Assessment Berg Balance Test      Berg Balance Test   Sit to Stand Able to stand  independently using hands    Standing Unsupported Able to stand 2 minutes with supervision    Sitting with Back Unsupported but Feet Supported on Floor or Stool Able to sit safely and securely 2 minutes    Stand to Sit Controls descent by using hands    Transfers Able to transfer safely, definite need of hands    Standing Unsupported with Eyes Closed Able to stand 10 seconds with  supervision    Standing Unsupported with Feet Together Needs help to attain position but able to stand for 30 seconds with feet together    From Standing, Reach Forward with Outstretched Arm Can reach forward >5 cm safely (2")    From Standing Position, Pick up Object from Floor Unable to pick up and needs supervision    From Standing Position, Turn to Look Behind Over each Shoulder Turn sideways only but maintains balance    Turn 360 Degrees Needs assistance while turning    Standing Unsupported,  Alternately Place Feet on Step/Stool Needs assistance to keep from falling or unable to try    Standing Unsupported, One Foot in Front Needs help to step but can hold 15 seconds    Standing on One Leg Tries to lift leg/unable to hold 3 seconds but remains standing independently    Total Score 27    Berg comment: < 36 high risk for falls (close to 100%)           Prosthetics Assessment - 08/14/20 0845      Prosthetics   Prosthetic Care Dependent with Skin check;Residual limb care;Prosthetic cleaning;Ply sock cleaning;Correct ply sock adjustment;Proper wear schedule/adjustment;Proper weight-bearing schedule/adjustment    Donning prosthesis  Supervision    Doffing prosthesis  Supervision    Current prosthetic wear tolerance (days/week)  7 of 7 days since delivery    Current prosthetic wear tolerance (#hours/day)  up to 1 hr 2x/day,  PT recommended 2hrs 2x/day with increase q5 days if no issues with skin or limb pain    Current prosthetic weight-bearing tolerance (hours/day)  pt tolerated 5 minutes for standing & gait without c/o limb pain or discomfort.    Edema pitting with 5 sec capillary refill    Residual limb condition  no open areas, scar invaginated slightly, sweaty skin, normal color & temperature, cylinderical shape    Prosthesis Description silicon liner with shuttle pin lock, total contact socket, dynamic response foot    K code/activity level with prosthetic use  K3 Full community with  variable cadence                     Objective measurements completed on examination: See above findings.       [place] Adult PT Treatment/Exercise - 08/14/20 0845      Prosthetics   Prosthetic Care Comments  driving with Right TTA prosthesis options with recommendation to practie in middle of large empty parking lot.  Residual limb pain vs phantom sensation vs phantom pain.    Education Provided Skin check;Residual limb care;Prosthetic cleaning;Ply sock cleaning;Correct ply sock adjustment;Proper Donning;Proper wear schedule/adjustment;Other (comment)   see prosthetic care comments   Person(s) Educated Patient;Child(ren)    Education Method Explanation;Demonstration;Tactile cues;Verbal cues    Education Method Verbalized understanding;Tactile cues required;Verbal cues required;Needs further instruction                    PT Short Term Goals - 08/14/20 1031      PT SHORT TERM GOAL #1   Title Patient demonstrates proper donning & verbalizes proper cleaning of prosthesis & socks.    Time 1    Period Months    Status New    Target Date 09/14/20      PT SHORT TERM GOAL #2   Title Patient tolerates wear of prosthesis 12 hrs total / day with skin or limb pain issues.    Time 1    Period Months    Status New    Target Date 09/14/20      PT SHORT TERM GOAL #3   Title Patient reaches 10" and picks up items from floor without UE support safely.    Time 1    Period Months    Status New    Target Date 09/14/20      PT SHORT TERM GOAL #4   Title Patient ambulates 300' with cane & prosthesis with supervision.    Time 1    Period Months    Status New  Target Date 09/14/20      PT SHORT TERM GOAL #5   Title Patient negotiates ramps & curbs with cane & prosthesis with minA.    Time 1    Period Months    Status New    Target Date 09/14/20             PT Long Term Goals - 08/14/20 1027      PT LONG TERM GOAL #1   Title Patient demonstrates &  verbalizes understanding of prosthetic care to enable safe utilization of prosthesis.    Time 10    Period Weeks    Status New    Target Date 10/19/20      PT LONG TERM GOAL #2   Title Patient tolerates wear of prosthesis >90% of awake hours without skin or limb pain issues to enable function throughout his day.    Time 10    Period Weeks    Status New    Target Date 10/19/20      PT LONG TERM GOAL #3   Title Berg Balance  >/= 45/56 to indicate lower fall risk.    Time 10    Period Weeks    Status New    Target Date 10/19/20      PT LONG TERM GOAL #4   Title Patient ambulates 500' with cane or less and prosthesis modified independent to enable community mobility.    Time 10    Period Weeks    Status New    Target Date 10/19/20      PT LONG TERM GOAL #5   Title Patient negotiates ramps, curbs & stairs with cane or less and prosthesis modified independent.    Time 10    Period Weeks    Status New    Target Date 10/19/20      Additional Long Term Goals   Additional Long Term Goals Yes      PT LONG TERM GOAL #6   Title Patient performs work simulation tasks including lifting & carrying 20# safely.    Time 10    Period Weeks    Status New    Target Date 10/19/20                  Plan - 08/14/20 1020    Clinical Impression Statement This 65yo male underwent a right Transtibial Amputation on 05/09/2020 and received his first prosthesis on 08/07/2020. He is dependent in proper prosthetic use which increases risk of skin issues.  He has limited wear which limits function during his awake hours. Berg Balance score of 27/56 indicates high fall risk & dependency in standing ADLs.  His prosthetic gait requires heavy arm support on walker with gait deviations & gait velocity of 1.60 ft/sec indicating high fall risk.  Patient would benefit from skilled PT to improve function & safety with his prosthesis.    Personal Factors and Comorbidities Comorbidity 3+;Fitness     Comorbidities rt TTA, CKD, neuropathy, COPD w/ DOE, anemia, PAD s/p multiple revascularizations, HTN    Examination-Activity Limitations Carry;Lift;Locomotion Level;Reach Overhead;Squat;Stairs;Stand;Transfers    Examination-Participation Restrictions Community Activity;Occupation    Stability/Clinical Decision Making Evolving/Moderate complexity    Clinical Decision Making Moderate    Rehab Potential Good    PT Frequency 2x / week    PT Duration Other (comment)   10 weeks   PT Treatment/Interventions ADLs/Self Care Home Management;DME Instruction;Gait training;Stair training;Functional mobility training;Therapeutic activities;Therapeutic exercise;Balance training;Neuromuscular re-education;Patient/family education;Prosthetic Training;Manual techniques;Vestibular    PT  Next Visit Plan review prosthetic care, instruct in negotiating ramps & curbs with RW & prosthesis, set up HEP at sink    Consulted and Agree with Plan of Care Patient;Family member/caregiver    Family Member Consulted dtr, Suzanne Garbers           Patient will benefit from skilled therapeutic intervention in order to improve the following deficits and impairments:  Abnormal gait,Cardiopulmonary status limiting activity,Decreased activity tolerance,Decreased balance,Decreased knowledge of precautions,Decreased mobility,Decreased strength,Increased edema,Postural dysfunction,Prosthetic Dependency,Pain  Visit Diagnosis: Unsteadiness on feet  Other abnormalities of gait and mobility  Muscle weakness (generalized)  Abnormal posture     Problem List Patient Active Problem List   Diagnosis Date Noted  . Phantom limb pain ([place])   . Essential hypertension   . AKI (acute kidney injury) ([place])   . Acute blood loss anemia   . Postoperative pain   . Below-knee amputation of right lower extremity ([place]) 05/17/2020  . Ischemia of right lower extremity 01/28/2019  . Iron deficiency anemia 11/23/2018  . Stage 3 chronic kidney  disease ([place]) 10/21/2018  . Atherosclerosis of native arteries of extremity with rest pain ([place]) 10/13/2018  . PAD (peripheral artery disease) ([place]) 07/21/2018  . Ischemic leg 06/25/2018  . Claudication ([place]) 03/17/2018  . B12 deficiency 03/12/2018  . Vitamin B1 deficiency 03/12/2018  . Varicose veins of leg with pain, right 03/12/2018  . Enlarged thoracic aorta ([place]) 02/24/2018  . Alcoholism /alcohol abuse 02/24/2018  . Paresthesia 02/24/2018  . Ectatic aorta ([place]) 02/20/2018  . Emphysema lung ([place]) 02/20/2018  . Atherosclerosis of aorta ([place]) 02/20/2018  . Encounter for tobacco use cessation counseling   . Benign neoplasm of cecum   . Benign neoplasm of transverse colon   . Benign neoplasm of sigmoid colon   . Hypertension, benign 08/31/2015  . Tobacco use 08/31/2015    Jamey Reas, PT, DPT 08/14/2020, 10:34 AM  [facility] [address] [place], [place], [address] Phone: [phone]   Fax:  [phone]  Name: ERUBIEL MANASCO MRN: [id number] Date of Birth: 1956/02/04

## 2020-08-17 ENCOUNTER — Other Ambulatory Visit: Payer: Self-pay

## 2020-08-17 ENCOUNTER — Encounter: Payer: Self-pay | Admitting: Physical Therapy

## 2020-08-17 ENCOUNTER — Ambulatory Visit (INDEPENDENT_AMBULATORY_CARE_PROVIDER_SITE_OTHER): Payer: 59 | Admitting: Physical Therapy

## 2020-08-17 DIAGNOSIS — R293 Abnormal posture: Secondary | ICD-10-CM

## 2020-08-17 DIAGNOSIS — R2689 Other abnormalities of gait and mobility: Secondary | ICD-10-CM | POA: Diagnosis not present

## 2020-08-17 DIAGNOSIS — M6281 Muscle weakness (generalized): Secondary | ICD-10-CM | POA: Diagnosis not present

## 2020-08-17 DIAGNOSIS — R2681 Unsteadiness on feet: Secondary | ICD-10-CM | POA: Diagnosis not present

## 2020-08-17 NOTE — Therapy (Signed)
[facility] [address] [place], [place], [address] Phone: [phone]   Fax:  [phone]  Physical Therapy Treatment  Patient Details  Name: Ryan [name] MRN: [id number] Date of Birth: 04-30-1956 Referring Provider (PT): Waynetta Sandy, MD   Encounter Date: 08/17/2020   PT End of Session - 08/17/20 0843    Visit Number 2    Number of Visits 20    Date for PT Re-Evaluation 10/19/20    Authorization Type [employer]    Authorization Time Period 20% coinsurance, $1500 OOP max not met    PT Start Time 0843    PT Stop Time 0936    PT Time Calculation (min) 53 min    Equipment Utilized During Treatment Gait belt    Activity Tolerance Patient tolerated treatment well    Behavior During Therapy [facility] for tasks assessed/performed           Past Medical History:  Diagnosis Date  . CKD (chronic kidney disease)   . History of blood transfusion   . Hyperlipidemia   . Hypertension   . Neuromuscular disorder (HCC)    numbness feet  . Peripheral vascular disease ([place])   . Shortness of breath dyspnea    occasional     Past Surgical History:  Procedure Laterality Date  . ABDOMINAL AORTOGRAM W/LOWER EXTREMITY N/A 09/13/2019   Procedure: ABDOMINAL AORTOGRAM W/LOWER EXTREMITY;  Surgeon: Waynetta Sandy, MD;  Location: [place] [facility];  Service: Cardiovascular;  Laterality: N/A;  . AMPUTATION Right 05/09/2020   Procedure: RIGHT BELOW KNEE AMPUTATION;  Surgeon: Waynetta Sandy, MD;  Location: [place];  Service: Vascular;  Laterality: Right;  w/ a block  . COLONOSCOPY WITH PROPOFOL N/A 02/15/2016   Procedure: COLONOSCOPY WITH PROPOFOL;  Surgeon: Lucilla Lame, MD;  Location: [place];  Service: Endoscopy;  Laterality: N/A;  . FEMORAL-POPLITEAL BYPASS GRAFT Right 09/14/2019   Procedure: BYPASS GRAFT FEMORAL-POPLITEAL ARTERY;  Surgeon: Waynetta Sandy, MD;  Location: [place];  Service: Vascular;   Laterality: Right;  . HERNIA REPAIR  1999   left inguinal  . INSERTION OF ILIAC STENT Right 09/14/2019   Procedure: Insertion Of Common and External Iliac Stent;  Surgeon: Waynetta Sandy, MD;  Location: [place];  Service: Vascular;  Laterality: Right;  . LOWER EXTREMITY ANGIOGRAPHY Right 05/07/2018   Procedure: LOWER EXTREMITY ANGIOGRAPHY;  Surgeon: Algernon Huxley, MD;  Location: [place] [facility];  Service: Cardiovascular;  Laterality: Right;  . LOWER EXTREMITY ANGIOGRAPHY Right 06/25/2018   Procedure: LOWER EXTREMITY ANGIOGRAPHY;  Surgeon: Algernon Huxley, MD;  Location: [place] [facility];  Service: Cardiovascular;  Laterality: Right;  . LOWER EXTREMITY ANGIOGRAPHY Right 07/01/2018   Procedure: LOWER EXTREMITY ANGIOGRAPHY;  Surgeon: Algernon Huxley, MD;  Location: [place] [facility];  Service: Cardiovascular;  Laterality: Right;  . LOWER EXTREMITY ANGIOGRAPHY Right 08/12/2018   Procedure: LOWER EXTREMITY ANGIOGRAPHY;  Surgeon: Algernon Huxley, MD;  Location: [place] [facility];  Service: Cardiovascular;  Laterality: Right;  . LOWER EXTREMITY ANGIOGRAPHY Right 09/03/2018   Procedure: LOWER EXTREMITY ANGIOGRAPHY;  Surgeon: Algernon Huxley, MD;  Location: [place] [facility];  Service: Cardiovascular;  Laterality: Right;  . LOWER EXTREMITY ANGIOGRAPHY Right 09/04/2018   Procedure: Lower Extremity Angiography;  Surgeon: Algernon Huxley, MD;  Location: [place] [facility];  Service: Cardiovascular;  Laterality: Right;  . LOWER EXTREMITY ANGIOGRAPHY Right 10/01/2018   Procedure: LOWER EXTREMITY ANGIOGRAPHY;  Surgeon: Algernon Huxley, MD;  Location: [place] [facility];  Service: Cardiovascular;  Laterality: Right;  . LOWER EXTREMITY ANGIOGRAPHY Right 10/01/2018   Procedure: Lower Extremity Angiography;  Surgeon: Algernon Huxley, MD;  Location: [place] [facility];  Service: Cardiovascular;  Laterality: Right;  . LOWER EXTREMITY ANGIOGRAPHY Right 10/15/2018   Procedure: LOWER EXTREMITY ANGIOGRAPHY;  Surgeon:  Algernon Huxley, MD;  Location: [place] [facility];  Service: Cardiovascular;  Laterality: Right;  . LOWER EXTREMITY ANGIOGRAPHY Left 10/16/2018   Procedure: Lower Extremity Angiography;  Surgeon: Algernon Huxley, MD;  Location: [place] [facility];  Service: Cardiovascular;  Laterality: Left;  . LOWER EXTREMITY ANGIOGRAPHY Left 01/20/2019   Procedure: LOWER EXTREMITY ANGIOGRAPHY;  Surgeon: Algernon Huxley, MD;  Location: [place] [facility];  Service: Cardiovascular;  Laterality: Left;  . LOWER EXTREMITY ANGIOGRAPHY Right 01/21/2019   Procedure: Lower Extremity Angiography;  Surgeon: Algernon Huxley, MD;  Location: [place] [facility];  Service: Cardiovascular;  Laterality: Right;  . LOWER EXTREMITY ANGIOGRAPHY Right 01/28/2019   Procedure: LOWER EXTREMITY ANGIOGRAPHY;  Surgeon: Algernon Huxley, MD;  Location: [place] [facility];  Service: Cardiovascular;  Laterality: Right;  . LOWER EXTREMITY ANGIOGRAPHY Right 01/29/2019   Procedure: Lower Extremity Angiography;  Surgeon: Algernon Huxley, MD;  Location: [place] [facility];  Service: Cardiovascular;  Laterality: Right;  . LOWER EXTREMITY ANGIOGRAPHY Right 04/04/2020   Procedure: LOWER EXTREMITY ANGIOGRAPHY;  Surgeon: Waynetta Sandy, MD;  Location: [place] [facility];  Service: Cardiovascular;  Laterality: Right;  . LOWER EXTREMITY INTERVENTION Right 07/02/2018   Procedure: LOWER EXTREMITY INTERVENTION;  Surgeon: Algernon Huxley, MD;  Location: [place] [facility];  Service: Cardiovascular;  Laterality: Right;  . PERIPHERAL VASCULAR BALLOON ANGIOPLASTY Left 04/04/2020   Procedure: PERIPHERAL VASCULAR BALLOON ANGIOPLASTY;  Surgeon: Waynetta Sandy, MD;  Location: [place] [facility];  Service: Cardiovascular;  Laterality: Left;  external iliac  . POLYPECTOMY N/A 02/15/2016   Procedure: POLYPECTOMY;  Surgeon: Lucilla Lame, MD;  Location: [place];  Service: Endoscopy;  Laterality: N/A;    There were no vitals filed for this visit.    Subjective Assessment - 08/17/20 0844    Subjective He has worn prosthesis 2 hrs 2x/day as PT recommended.    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 No/denies    Pain Onset More than a month ago                             [facility] Adult PT Treatment/Exercise - 08/17/20 0845      Ambulation/Gait   Ambulation/Gait Yes    Ambulation/Gait Assistance 5: Supervision    Ambulation/Gait Assistance Details PT adjusted RW to proper height to facilitate upright posture.      Therapeutic Activites    Therapeutic Activities Other Therapeutic Activities    Other Therapeutic Activities sitting on 24" bar stool with feet planted on floor to work on upper body balance & tolerance without UE support.      Neuro Re-ed    Neuro Re-ed Details  HEP at sink see pt instructions.  Tactile & verbal cues on upright posture and using socket interface for proprioception.  Prosthetics   Prosthetic Care Comments  PT instructed how to adjust ply socks with demo donning & ambulating with too few, too many and proper ply socks.  PT instructed in donning with long nonstretch pants & managing limb or socks.    Current prosthetic wear tolerance (days/week)  daily    Current prosthetic wear tolerance (#hours/day)  2hrs 2x/day. On 2/26 if no issues, increase to 3hrs 2x/day    Current prosthetic weight-bearing tolerance (hours/day)  pt tolerated 10 minutes for standing & gait without c/o limb pain or discomfort.    Residual limb condition  no open areas, scar invaginated slightly, sweaty skin, normal color & temperature, cylinderical shape    Education Provided Skin check;Residual limb care;Correct ply sock adjustment;Proper Donning;Proper wear  schedule/adjustment;Other (comment)   see prosthetic care comments   Person(s) Educated Patient;Child(ren)    Education Method Explanation;Demonstration;Tactile cues;Verbal cues    Education Method Verbalized understanding;Tactile cues required;Verbal cues required;Needs further instruction    Donning Prosthesis Supervision                  PT Education - 08/17/20 1210    Education Details HEP at sink - see patient instructions    Person(s) Educated Patient;Child(ren)    Methods Explanation;Demonstration;Tactile cues;Verbal cues;Handout    Comprehension Verbalized understanding;Returned demonstration;Verbal cues required;Tactile cues required            PT Short Term Goals - 08/14/20 1031      PT SHORT TERM GOAL #1   Title Patient demonstrates proper donning & verbalizes proper cleaning of prosthesis & socks.    Time 1    Period Months    Status New    Target Date 09/14/20      PT SHORT TERM GOAL #2   Title Patient tolerates wear of prosthesis 12 hrs total / day with skin or limb pain issues.    Time 1    Period Months    Status New    Target Date 09/14/20      PT SHORT TERM GOAL #3   Title Patient reaches 10" and picks up items from floor without UE support safely.    Time 1    Period Months    Status New    Target Date 09/14/20      PT SHORT TERM GOAL #4   Title Patient ambulates 300' with cane & prosthesis with supervision.    Time 1    Period Months    Status New    Target Date 09/14/20      PT SHORT TERM GOAL #5   Title Patient negotiates ramps & curbs with cane & prosthesis with minA.    Time 1    Period Months    Status New    Target Date 09/14/20             PT Long Term Goals - 08/14/20 1027      PT LONG TERM GOAL #1   Title Patient demonstrates & verbalizes understanding of prosthetic care to enable safe utilization of prosthesis.    Time 10    Period Weeks    Status New    Target Date 10/19/20      PT LONG TERM GOAL #2   Title  Patient tolerates wear of prosthesis >90% of awake hours without skin or limb pain issues to enable function throughout his day.    Time 10    Period Weeks    Status New    Target Date  10/19/20      PT LONG TERM GOAL #3   Title [place] Balance  >/= 45/56 to indicate lower fall risk.    Time 10    Period Weeks    Status New    Target Date 10/19/20      PT LONG TERM GOAL #4   Title Patient ambulates 500' with cane or less and prosthesis modified independent to enable community mobility.    Time 10    Period Weeks    Status New    Target Date 10/19/20      PT LONG TERM GOAL #5   Title Patient negotiates ramps, curbs & stairs with cane or less and prosthesis modified independent.    Time 10    Period Weeks    Status New    Target Date 10/19/20      Additional Long Term Goals   Additional Long Term Goals Yes      PT LONG TERM GOAL #6   Title Patient performs work simulation tasks including lifting & carrying 20# safely.    Time 10    Period Weeks    Status New    Target Date 10/19/20                 Plan - 08/17/20 0843    Clinical Impression Statement PT instructed in how to determine proper ply socks based on limb size and he appears to have a general understanding how to adjust.  PT instructed in HEP at sink working on standing balance, standing tolerance and proprioception with socket interface and he appears to have general understanding.    Personal Factors and Comorbidities Comorbidity 3+;Fitness    Comorbidities rt TTA, CKD, neuropathy, COPD w/ DOE, anemia, PAD s/p multiple revascularizations, HTN    Examination-Activity Limitations Carry;Lift;Locomotion Level;Reach Overhead;Squat;Stairs;Stand;Transfers    Examination-Participation Restrictions Community Activity;Occupation    Stability/Clinical Decision Making Evolving/Moderate complexity    Rehab Potential Good    PT Frequency 2x / week    PT Duration Other (comment)   10 weeks   PT Treatment/Interventions  ADLs/Self Care Home Management;DME Instruction;Gait training;Stair training;Functional mobility training;Therapeutic activities;Therapeutic exercise;Balance training;Neuromuscular re-education;Patient/family education;Prosthetic Training;Manual techniques;Vestibular    PT Next Visit Plan review prosthetic care, instruct in negotiating ramps & curbs with RW & prosthesis,    Consulted and Agree with Plan of Care Patient;Family member/caregiver    Family Member Consulted dtr, Ryan [name]           Patient will benefit from skilled therapeutic intervention in order to improve the following deficits and impairments:  Abnormal gait,Cardiopulmonary status limiting activity,Decreased activity tolerance,Decreased balance,Decreased knowledge of precautions,Decreased mobility,Decreased strength,Increased edema,Postural dysfunction,Prosthetic Dependency,Pain  Visit Diagnosis: Unsteadiness on feet  Muscle weakness (generalized)  Abnormal posture  Other abnormalities of gait and mobility     Problem List Patient Active Problem List   Diagnosis Date Noted  . Phantom limb pain ([place])   . Essential hypertension   . AKI (acute kidney injury) ([place])   . Acute blood loss anemia   . Postoperative pain   . Below-knee amputation of right lower extremity ([place]) 05/17/2020  . Ischemia of right lower extremity 01/28/2019  . Iron deficiency anemia 11/23/2018  . Stage 3 chronic kidney disease ([place]) 10/21/2018  . Atherosclerosis of native arteries of extremity with rest pain ([place]) 10/13/2018  . PAD (peripheral artery disease) ([place]) 07/21/2018  . Ischemic leg 06/25/2018  . Claudication ([place]) 03/17/2018  . B12 deficiency 03/12/2018  . Vitamin B1 deficiency 03/12/2018  .  Varicose veins of leg with pain, right 03/12/2018  . Enlarged thoracic aorta ([place]) 02/24/2018  . Alcoholism /alcohol abuse 02/24/2018  . Paresthesia 02/24/2018  . Ectatic aorta ([place]) 02/20/2018  . Emphysema lung ([place]) 02/20/2018  .  Atherosclerosis of aorta ([place]) 02/20/2018  . Encounter for tobacco use cessation counseling   . Benign neoplasm of cecum   . Benign neoplasm of transverse colon   . Benign neoplasm of sigmoid colon   . Hypertension, benign 08/31/2015  . Tobacco use 08/31/2015    Jamey Reas, PT, DPT 08/17/2020, 12:19 PM  [facility] [address] [place], [place], [address] Phone: [phone]   Fax:  [phone]  Name: Ryan [name] MRN: [id number] Date of Birth: [birth year]

## 2020-08-17 NOTE — Patient Instructions (Signed)
Do each exercise 1-2  times per day Do each exercise 5-10 repetitions Hold each exercise for 2 seconds to feel your location  AT [place].  Try to find this position when standing still for activities.   USE TAPE ON FLOOR TO MARK THE MIDLINE POSITION. You also should try to feel with your limb pressure in socket.  You are trying to feel with limb what you used to feel with the bottom of your foot.  1. Side to Side Shift: Moving your hips only (not shoulders): move weight onto your left leg, HOLD/FEEL.  Move back to equal weight on each leg, HOLD/FEEL. Move weight onto your right leg, HOLD/FEEL. Move back to equal weight on each leg, HOLD/FEEL. Repeat.  Start with both hands on sink, progress to right hand only, then no hands.  2. Front to Back Shift: Moving your hips only (not shoulders): move your weight forward onto your toes, HOLD/FEEL. Move your weight back to equal Flat Foot on both legs, HOLD/FEEL. Move your weight back onto your heels, HOLD/FEEL. Move your weight back to equal on both legs, HOLD/FEEL. Repeat.   tart with both hands on sink, progress to right hand only, then no hands. 3. Moving Cones / Cups: With equal weight on each leg: Hold on with one hand the first time, then progress to no hand supports. Move cups from one side of sink to the other. Place cups ~2" out of your reach, progress to 10" beyond reach.  Place one hand in middle of sink and reach with other hand. Do both arms.  Then hover one hand and move cups with other hand.  4. Overhead/Upward Reaching: alternated reaching up to top cabinets or ceiling if no cabinets present. Keep equal weight on each leg. Start with one hand support on counter while other hand reaches and progress to no hand support with reaching.  ace one hand in middle of sink and reach with other hand. Do both arms.  Then hover one hand and move cups with other hand.  5.   Looking Over  Shoulders: With equal weight on each leg: alternate turning to look over your shoulders with one hand support on counter as needed.  Start with head motions only to look in front of shoulder, then even with shoulder and progress to looking behind you. To look to side, move head /eyes, then shoulder on side looking pulls back, shift more weight to side looking and pull hip back. ace one hand in middle of sink and let go with other hand so your shoulder can pull back. Switch hands to look other way.   Then hover one hand and move cups with other hand.

## 2020-08-22 ENCOUNTER — Ambulatory Visit (INDEPENDENT_AMBULATORY_CARE_PROVIDER_SITE_OTHER): Payer: 59 | Admitting: Physical Therapy

## 2020-08-22 ENCOUNTER — Encounter: Payer: Self-pay | Admitting: Physical Therapy

## 2020-08-22 ENCOUNTER — Other Ambulatory Visit: Payer: Self-pay

## 2020-08-22 DIAGNOSIS — R2681 Unsteadiness on feet: Secondary | ICD-10-CM

## 2020-08-22 DIAGNOSIS — M6281 Muscle weakness (generalized): Secondary | ICD-10-CM | POA: Diagnosis not present

## 2020-08-22 DIAGNOSIS — R293 Abnormal posture: Secondary | ICD-10-CM

## 2020-08-22 DIAGNOSIS — R2689 Other abnormalities of gait and mobility: Secondary | ICD-10-CM | POA: Diagnosis not present

## 2020-08-22 NOTE — Therapy (Signed)
[facility] [address] [place], [place], [address] Phone: [phone]   Fax:  [phone]  Physical Therapy Treatment  Patient Details  Name: Ryan [name] MRN: [id number] Date of Birth: June 17, 1956 Referring Provider (PT): Ryan Sandy, MD   Encounter Date: 08/22/2020   PT End of Session - 08/22/20 1522    Visit Number 3    Number of Visits 20    Date for PT Re-Evaluation 10/19/20    Authorization Type Ryan [name]    Authorization Time Period 20% coinsurance, $1500 OOP max not met    PT Start Time 1517    PT Stop Time 1602    PT Time Calculation (min) 45 min    Equipment Utilized During Treatment Gait belt    Activity Tolerance Patient tolerated treatment well    Behavior During Therapy Ryan [name] for tasks assessed/performed           Past Medical History:  Diagnosis Date  . CKD (chronic kidney disease)   . History of blood transfusion   . Hyperlipidemia   . Hypertension   . Neuromuscular disorder (HCC)    numbness feet  . Peripheral vascular disease ([place])   . Shortness of breath dyspnea    occasional     Past Surgical History:  Procedure Laterality Date  . ABDOMINAL AORTOGRAM W/LOWER EXTREMITY N/A 09/13/2019   Procedure: ABDOMINAL AORTOGRAM W/LOWER EXTREMITY;  Surgeon: Ryan Sandy, MD;  Location: [place] [facility];  Service: Cardiovascular;  Laterality: N/A;  . AMPUTATION Right 05/09/2020   Procedure: RIGHT BELOW KNEE AMPUTATION;  Surgeon: Ryan Sandy, MD;  Location: [place];  Service: Vascular;  Laterality: Right;  w/ a block  . COLONOSCOPY WITH PROPOFOL N/A 02/15/2016   Procedure: COLONOSCOPY WITH PROPOFOL;  Surgeon: Ryan Lame, MD;  Location: [place];  Service: Endoscopy;  Laterality: N/A;  . FEMORAL-POPLITEAL BYPASS GRAFT Right 09/14/2019   Procedure: BYPASS GRAFT FEMORAL-POPLITEAL ARTERY;  Surgeon: Ryan Sandy, MD;  Location: [place];  Service: Vascular;   Laterality: Right;  . HERNIA REPAIR  1999   left inguinal  . INSERTION OF ILIAC STENT Right 09/14/2019   Procedure: Insertion Of Common and External Iliac Stent;  Surgeon: Ryan Sandy, MD;  Location: [place];  Service: Vascular;  Laterality: Right;  . LOWER EXTREMITY ANGIOGRAPHY Right 05/07/2018   Procedure: LOWER EXTREMITY ANGIOGRAPHY;  Surgeon: Ryan Huxley, MD;  Location: [place] [facility];  Service: Cardiovascular;  Laterality: Right;  . LOWER EXTREMITY ANGIOGRAPHY Right 06/25/2018   Procedure: LOWER EXTREMITY ANGIOGRAPHY;  Surgeon: Ryan Huxley, MD;  Location: [place] [facility];  Service: Cardiovascular;  Laterality: Right;  . LOWER EXTREMITY ANGIOGRAPHY Right 07/01/2018   Procedure: LOWER EXTREMITY ANGIOGRAPHY;  Surgeon: Ryan Huxley, MD;  Location: [place] [facility];  Service: Cardiovascular;  Laterality: Right;  . LOWER EXTREMITY ANGIOGRAPHY Right 08/12/2018   Procedure: LOWER EXTREMITY ANGIOGRAPHY;  Surgeon: Ryan Huxley, MD;  Location: [place] [facility];  Service: Cardiovascular;  Laterality: Right;  . LOWER EXTREMITY ANGIOGRAPHY Right 09/03/2018   Procedure: LOWER EXTREMITY ANGIOGRAPHY;  Surgeon: Ryan Huxley, MD;  Location: [place] [facility];  Service: Cardiovascular;  Laterality: Right;  . LOWER EXTREMITY ANGIOGRAPHY Right 09/04/2018   Procedure: Lower Extremity Angiography;  Surgeon: Ryan Huxley, MD;  Location: [place] [facility];  Service: Cardiovascular;  Laterality: Right;  . LOWER EXTREMITY ANGIOGRAPHY Right 10/01/2018   Procedure: LOWER EXTREMITY ANGIOGRAPHY;  Surgeon: Ryan Huxley, MD;  Location: [place] [facility];  Service: Cardiovascular;  Laterality: Right;  . LOWER EXTREMITY ANGIOGRAPHY Right 10/01/2018   Procedure: Lower Extremity Angiography;  Surgeon: Ryan Huxley, MD;  Location: [place] [facility];  Service: Cardiovascular;  Laterality: Right;  . LOWER EXTREMITY ANGIOGRAPHY Right 10/15/2018   Procedure: LOWER EXTREMITY ANGIOGRAPHY;  Surgeon:  Ryan Huxley, MD;  Location: [place] [facility];  Service: Cardiovascular;  Laterality: Right;  . LOWER EXTREMITY ANGIOGRAPHY Left 10/16/2018   Procedure: Lower Extremity Angiography;  Surgeon: Ryan Huxley, MD;  Location: [place] [facility];  Service: Cardiovascular;  Laterality: Left;  . LOWER EXTREMITY ANGIOGRAPHY Left 01/20/2019   Procedure: LOWER EXTREMITY ANGIOGRAPHY;  Surgeon: Ryan Huxley, MD;  Location: [place] [facility];  Service: Cardiovascular;  Laterality: Left;  . LOWER EXTREMITY ANGIOGRAPHY Right 01/21/2019   Procedure: Lower Extremity Angiography;  Surgeon: Ryan Huxley, MD;  Location: [place] [facility];  Service: Cardiovascular;  Laterality: Right;  . LOWER EXTREMITY ANGIOGRAPHY Right 01/28/2019   Procedure: LOWER EXTREMITY ANGIOGRAPHY;  Surgeon: Ryan Huxley, MD;  Location: [place] [facility];  Service: Cardiovascular;  Laterality: Right;  . LOWER EXTREMITY ANGIOGRAPHY Right 01/29/2019   Procedure: Lower Extremity Angiography;  Surgeon: Ryan Huxley, MD;  Location: [place] [facility];  Service: Cardiovascular;  Laterality: Right;  . LOWER EXTREMITY ANGIOGRAPHY Right 04/04/2020   Procedure: LOWER EXTREMITY ANGIOGRAPHY;  Surgeon: Ryan Sandy, MD;  Location: [place] [facility];  Service: Cardiovascular;  Laterality: Right;  . LOWER EXTREMITY INTERVENTION Right 07/02/2018   Procedure: LOWER EXTREMITY INTERVENTION;  Surgeon: Ryan Huxley, MD;  Location: [place] [facility];  Service: Cardiovascular;  Laterality: Right;  . PERIPHERAL VASCULAR BALLOON ANGIOPLASTY Left 04/04/2020   Procedure: PERIPHERAL VASCULAR BALLOON ANGIOPLASTY;  Surgeon: Ryan Sandy, MD;  Location: [place] [facility];  Service: Cardiovascular;  Laterality: Left;  external iliac  . POLYPECTOMY N/A 02/15/2016   Procedure: POLYPECTOMY;  Surgeon: Ryan Lame, MD;  Location: [place];  Service: Endoscopy;  Laterality: N/A;    There were no vitals filed for this visit.    Subjective Assessment - 08/22/20 1515    Subjective He reports wearing prosthesis 3 hrs 2x/day starting on Saturday, 2/26 without issues. He has been doing his exercises at sink.    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 No/denies    Pain Onset More than a month ago                             [name] Adult PT Treatment/Exercise - 08/22/20 1517      Transfers   Transfers Sit to Stand;Stand to Sit    Sit to Stand 5: Supervision;With upper extremity assist;With armrests;From chair/3-in-1    Sit to Stand Details (indicate cue type and reason) worked on not needing external support to stabilize    Stand to Sit 5: Supervision;With upper extremity assist;With armrests;To chair/3-in-1    Stand to Sit Details worked on not needing external support to stabilize      Ambulation/Gait   Ambulation/Gait Yes    Ambulation/Gait Assistance 4: Min assist    Ambulation/Gait Assistance Details PT  demo, verbal & tactile cues on sequence with cane & step through pattern. Assessed SBQC (pt owns) & straight cane stand alone tip.  He is currently safer with improved stability prosthetic stance with SBQC.    Ambulation Distance (Feet) 100 Feet   100'X 2   Assistive device Rolling walker;Prosthesis;Small based quad cane;Straight cane    Ambulation Surface Level;Indoor      Exercises   Exercises Knee/Hip      Knee/Hip Exercises: Aerobic   Nustep seat 10 level 5 with BLEs & BUEs for 8 min as warm up      Prosthetics   Prosthetic Care Comments  PT instructed in mirror therapy & isometric muscle contraction of residual limb as phantom limb pain option.  PT decreased ply sock fit from 5 to 3 which improved feel of prosthesis.     Current prosthetic wear tolerance (days/week)  daily    Current prosthetic wear tolerance (#hours/day)  3hrs 2x/day since 2/26    Current prosthetic weight-bearing tolerance (hours/day)  pt tolerated 10 minutes for standing & gait without c/o limb pain or discomfort.    Edema pitting with 5 sec capillary refill    Residual limb condition  no open areas, scar invaginated slightly, sweaty skin, normal color & temperature, cylinderical shape    Education Provided Residual limb care;Other (comment);Correct ply sock adjustment   see prosthetic care comments   Person(s) Educated Patient    Education Method Explanation;Demonstration;Tactile cues;Verbal cues    Education Method Verbalized understanding;Returned demonstration;Tactile cues required;Verbal cues required;Needs further instruction                    PT Short Term Goals - 08/14/20 1031      PT SHORT TERM GOAL #1   Title Patient demonstrates proper donning & verbalizes proper cleaning of prosthesis & socks.    Time 1    Period Months    Status New    Target Date 09/14/20      PT SHORT TERM GOAL #2   Title Patient tolerates wear of prosthesis 12 hrs total / day with skin or limb pain issues.    Time 1    Period Months    Status New    Target Date 09/14/20      PT SHORT TERM GOAL #3   Title Patient reaches 10" and picks up items from floor without UE support safely.    Time 1    Period Months    Status New    Target Date 09/14/20      PT SHORT TERM GOAL #4   Title Patient ambulates 300' with cane & prosthesis with supervision.    Time 1    Period Months    Status New    Target Date 09/14/20      PT SHORT TERM GOAL #5   Title Patient negotiates ramps & curbs with cane & prosthesis with minA.    Time 1    Period Months    Status New    Target Date 09/14/20             PT Long Term Goals - 08/14/20 1027      PT LONG TERM GOAL #1   Title Patient demonstrates & verbalizes understanding of prosthetic care  to enable safe utilization of prosthesis.    Time 10    Period Weeks    Status New    Target Date 10/19/20      PT LONG TERM GOAL #2  Title Patient tolerates wear of prosthesis >90% of awake hours without skin or limb pain issues to enable function throughout his day.    Time 10    Period Weeks    Status New    Target Date 10/19/20      PT LONG TERM GOAL #3   Title Berg Balance  >/= 45/56 to indicate lower fall risk.    Time 10    Period Weeks    Status New    Target Date 10/19/20      PT LONG TERM GOAL #4   Title Patient ambulates 500' with cane or less and prosthesis modified independent to enable community mobility.    Time 10    Period Weeks    Status New    Target Date 10/19/20      PT LONG TERM GOAL #5   Title Patient negotiates ramps, curbs & stairs with cane or less and prosthesis modified independent.    Time 10    Period Weeks    Status New    Target Date 10/19/20      Additional Long Term Goals   Additional Long Term Goals Yes      PT LONG TERM GOAL #6   Title Patient performs work simulation tasks including lifting & carrying 20# safely.    Time 10    Period Weeks    Status New    Target Date 10/19/20                 Plan - 08/22/20 1522    Clinical Impression Statement PT introduced prosthetic gait with cane. He is currently better with [facility] [name] which he already owns at this time.    Personal Factors and Comorbidities Comorbidity 3+;Fitness    Comorbidities rt TTA, CKD, neuropathy, COPD w/ DOE, anemia, PAD s/p multiple revascularizations, HTN    Examination-Activity Limitations Carry;Lift;Locomotion Level;Reach Overhead;Squat;Stairs;Stand;Transfers    Examination-Participation Restrictions Community Activity;Occupation    Stability/Clinical Decision Making Evolving/Moderate complexity    Rehab Potential Good    PT Frequency 2x / week    PT Duration Other (comment)   10 weeks   PT Treatment/Interventions ADLs/Self Care Home Management;DME  Instruction;Gait training;Stair training;Functional mobility training;Therapeutic activities;Therapeutic exercise;Balance training;Neuromuscular re-education;Patient/family education;Prosthetic Training;Manual techniques;Vestibular    PT Next Visit Plan review prosthetic care, instruct in negotiating ramps & curbs with RW & prosthesis, household gait with SBQC    Consulted and Agree with Plan of Care Patient    Family Member Consulted --           Patient will benefit from skilled therapeutic intervention in order to improve the following deficits and impairments:  Abnormal gait,Cardiopulmonary status limiting activity,Decreased activity tolerance,Decreased balance,Decreased knowledge of precautions,Decreased mobility,Decreased strength,Increased edema,Postural dysfunction,Prosthetic Dependency,Pain  Visit Diagnosis: Unsteadiness on feet  Muscle weakness (generalized)  Abnormal posture  Other abnormalities of gait and mobility     Problem List Patient Active Problem List   Diagnosis Date Noted  . Phantom limb pain ([place])   . Essential hypertension   . AKI (acute kidney injury) ([place])   . Acute blood loss anemia   . Postoperative pain   . Below-knee amputation of right lower extremity ([place]) 05/17/2020  . Ischemia of right lower extremity 01/28/2019  . Iron deficiency anemia 11/23/2018  . Stage 3 chronic kidney disease ([place]) 10/21/2018  . Atherosclerosis of native arteries of extremity with rest pain ([place]) 10/13/2018  . PAD (peripheral artery disease) ([place]) 07/21/2018  . Ischemic leg 06/25/2018  . Claudication ([place])  03/17/2018  . B12 deficiency 03/12/2018  . Vitamin B1 deficiency 03/12/2018  . Varicose veins of leg with pain, right 03/12/2018  . Enlarged thoracic aorta ([place]) 02/24/2018  . Alcoholism /alcohol abuse 02/24/2018  . Paresthesia 02/24/2018  . Ectatic aorta ([place]) 02/20/2018  . Emphysema lung ([place]) 02/20/2018  . Atherosclerosis of aorta ([place]) 02/20/2018  .  Encounter for tobacco use cessation counseling   . Benign neoplasm of cecum   . Benign neoplasm of transverse colon   . Benign neoplasm of sigmoid colon   . Hypertension, benign 08/31/2015  . Tobacco use 08/31/2015    Jamey Reas, PT, DPT 08/22/2020, 4:39 PM  [facility] [address] [place], [place], [address] Phone: [phone]   Fax:  [phone]  Name: Ryan [name] MRN: [id number] Date of Birth: 09-21-1955

## 2020-08-24 ENCOUNTER — Other Ambulatory Visit: Payer: Self-pay

## 2020-08-24 ENCOUNTER — Ambulatory Visit (INDEPENDENT_AMBULATORY_CARE_PROVIDER_SITE_OTHER): Payer: 59 | Admitting: Physical Therapy

## 2020-08-24 ENCOUNTER — Encounter: Payer: Self-pay | Admitting: Physical Therapy

## 2020-08-24 DIAGNOSIS — R2681 Unsteadiness on feet: Secondary | ICD-10-CM

## 2020-08-24 DIAGNOSIS — R293 Abnormal posture: Secondary | ICD-10-CM | POA: Diagnosis not present

## 2020-08-24 DIAGNOSIS — R2689 Other abnormalities of gait and mobility: Secondary | ICD-10-CM | POA: Diagnosis not present

## 2020-08-24 DIAGNOSIS — M6281 Muscle weakness (generalized): Secondary | ICD-10-CM | POA: Diagnosis not present

## 2020-08-24 NOTE — Therapy (Signed)
[facility] [address] [place], [place], [address] Phone: [phone]   Fax:  [phone]  Physical Therapy Treatment  Patient Details  Name: Ryan [name] MRN: [id number] Date of Birth: 09/27/1955 Referring Provider (PT): Brandon Christopher Cain, MD   Encounter Date: 08/24/2020   PT End of Session - 08/24/20 0853    Visit Number 4    Number of Visits 20    Date for PT Re-Evaluation 10/19/20    Authorization Type [employer]    Authorization Time Period 20% coinsurance, $1500 OOP max not met    PT Start Time 0850    PT Stop Time 0930    PT Time Calculation (min) 40 min    Equipment Utilized During Treatment Gait belt    Activity Tolerance Patient tolerated treatment well    Behavior During Therapy WFL for tasks assessed/performed           Past Medical History:  Diagnosis Date  . CKD (chronic kidney disease)   . History of blood transfusion   . Hyperlipidemia   . Hypertension   . Neuromuscular disorder (HCC)    numbness feet  . Peripheral vascular disease (HCC)   . Shortness of breath dyspnea    occasional     Past Surgical History:  Procedure Laterality Date  . ABDOMINAL AORTOGRAM W/LOWER EXTREMITY N/A 09/13/2019   Procedure: ABDOMINAL AORTOGRAM W/LOWER EXTREMITY;  Surgeon: Cain, Brandon Christopher, MD;  [facility]: [facility];  Service: Cardiovascular;  Laterality: N/A;  . AMPUTATION Right 05/09/2020   Procedure: RIGHT BELOW KNEE AMPUTATION;  Surgeon: Cain, Brandon Christopher, MD;  Location: [facility];  Service: Vascular;  Laterality: Right;  w/ a block  . COLONOSCOPY WITH PROPOFOL N/A 02/15/2016   Procedure: COLONOSCOPY WITH PROPOFOL;  Surgeon: Darren Wohl, MD;  Location: [facility];  Service: Endoscopy;  Laterality: N/A;  . FEMORAL-POPLITEAL BYPASS GRAFT Right 09/14/2019   Procedure: BYPASS GRAFT FEMORAL-POPLITEAL ARTERY;  Surgeon: Cain, Brandon Christopher, MD;  Location: [facility];  Service: Vascular;   Laterality: Right;  . HERNIA REPAIR  1999   left inguinal  . INSERTION OF ILIAC STENT Right 09/14/2019   Procedure: Insertion Of Common and External Iliac Stent;  Surgeon: Cain, Brandon Christopher, MD;  Location: [facility];  Service: Vascular;  Laterality: Right;  . LOWER EXTREMITY ANGIOGRAPHY Right 05/07/2018   Procedure: LOWER EXTREMITY ANGIOGRAPHY;  Surgeon: Dew, Jason S, MD;  Location: [facility];  Service: Cardiovascular;  Laterality: Right;  . LOWER EXTREMITY ANGIOGRAPHY Right 06/25/2018   Procedure: LOWER EXTREMITY ANGIOGRAPHY;  Surgeon: Dew, Jason S, MD;  Location: [facility];  Service: Cardiovascular;  Laterality: Right;  . LOWER EXTREMITY ANGIOGRAPHY Right 07/01/2018   Procedure: LOWER EXTREMITY ANGIOGRAPHY;  Surgeon: Dew, Jason S, MD;  Location: [facility];  Service: Cardiovascular;  Laterality: Right;  . LOWER EXTREMITY ANGIOGRAPHY Right 08/12/2018   Procedure: LOWER EXTREMITY ANGIOGRAPHY;  Surgeon: Dew, Jason S, MD;  Location: [facility];  Service: Cardiovascular;  Laterality: Right;  . LOWER EXTREMITY ANGIOGRAPHY Right 09/03/2018   Procedure: LOWER EXTREMITY ANGIOGRAPHY;  Surgeon: Dew, Jason S, MD;  Location: [facility];  Service: Cardiovascular;  Laterality: Right;  . LOWER EXTREMITY ANGIOGRAPHY Right 09/04/2018   Procedure: Lower Extremity Angiography;  Surgeon: Dew, Jason S, MD;  Location: [facility];  Service: Cardiovascular;  Laterality: Right;  . LOWER EXTREMITY ANGIOGRAPHY Right 10/01/2018   Procedure: LOWER EXTREMITY ANGIOGRAPHY;  Surgeon: Dew, Jason S, MD;    Location: [place];  Service: Cardiovascular;  Laterality: Right;  . LOWER EXTREMITY ANGIOGRAPHY Right 10/01/2018   Procedure: Lower Extremity Angiography;  Surgeon: Algernon Huxley, MD;  Location: [place] [facility];  Service: Cardiovascular;  Laterality: Right;  . LOWER EXTREMITY ANGIOGRAPHY Right 10/15/2018   Procedure: LOWER EXTREMITY ANGIOGRAPHY;  Surgeon:  Algernon Huxley, MD;  Location: [place] [facility];  Service: Cardiovascular;  Laterality: Right;  . LOWER EXTREMITY ANGIOGRAPHY Left 10/16/2018   Procedure: Lower Extremity Angiography;  Surgeon: Algernon Huxley, MD;  Location: [place] [facility];  Service: Cardiovascular;  Laterality: Left;  . LOWER EXTREMITY ANGIOGRAPHY Left 01/20/2019   Procedure: LOWER EXTREMITY ANGIOGRAPHY;  Surgeon: Algernon Huxley, MD;  Location: [place] [facility];  Service: Cardiovascular;  Laterality: Left;  . LOWER EXTREMITY ANGIOGRAPHY Right 01/21/2019   Procedure: Lower Extremity Angiography;  Surgeon: Algernon Huxley, MD;  Location: [place] [facility];  Service: Cardiovascular;  Laterality: Right;  . LOWER EXTREMITY ANGIOGRAPHY Right 01/28/2019   Procedure: LOWER EXTREMITY ANGIOGRAPHY;  Surgeon: Algernon Huxley, MD;  Location: [place] [facility];  Service: Cardiovascular;  Laterality: Right;  . LOWER EXTREMITY ANGIOGRAPHY Right 01/29/2019   Procedure: Lower Extremity Angiography;  Surgeon: Algernon Huxley, MD;  Location: [facility] [facility];  Service: Cardiovascular;  Laterality: Right;  . LOWER EXTREMITY ANGIOGRAPHY Right 04/04/2020   Procedure: LOWER EXTREMITY ANGIOGRAPHY;  Surgeon: Waynetta Sandy, MD;  Location: [facility] [facility];  Service: Cardiovascular;  Laterality: Right;  . LOWER EXTREMITY INTERVENTION Right 07/02/2018   Procedure: LOWER EXTREMITY INTERVENTION;  Surgeon: Algernon Huxley, MD;  Location: [place] [facility];  Service: Cardiovascular;  Laterality: Right;  . PERIPHERAL VASCULAR BALLOON ANGIOPLASTY Left 04/04/2020   Procedure: PERIPHERAL VASCULAR BALLOON ANGIOPLASTY;  Surgeon: Waynetta Sandy, MD;  Location: [place] [facility];  Service: Cardiovascular;  Laterality: Left;  external iliac  . POLYPECTOMY N/A 02/15/2016   Procedure: POLYPECTOMY;  Surgeon: Lucilla Lame, MD;  Location: [facility];  Service: Endoscopy;  Laterality: N/A;    There were no vitals filed for this visit.    Subjective Assessment - 08/24/20 0850    Subjective He reports wearing prosthesis 4hrs 2x/day without issues.    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 No/denies    Pain Onset More than a month ago                             [facility] Adult PT Treatment/Exercise - 08/24/20 0850      Transfers   Transfers Sit to Stand;Stand to Sit    Sit to Stand 5: Supervision;With upper extremity assist;With armrests;From chair/3-in-1    Sit to Stand Details Visual cues/gestures for sequencing;Verbal cues for technique    Sit to Stand Details (indicate cue type and reason) worked on standing from chairs without armrest    Stand to Sit 5: Supervision;With upper extremity assist;With armrests;To chair/3-in-1    Stand to Sit Details (indicate cue type and reason) Visual cues/gestures for sequencing;Verbal cues for technique    Stand to Sit Details worked on sitting to chairs without armrests      Ambulation/Gait  Ambulation/Gait Yes    Ambulation/Gait Assistance 4: Min assist    Ambulation/Gait Assistance Details verbal & tactile cues on upright posture, wt shift over prosthesis in stance & equal step length.    Ambulation Distance (Feet) 100 Feet   100'X 2   Assistive device Rolling walker;Prosthesis;Small based quad cane   RW enter/exit, SBQC during session   Ambulation Surface Level;Indoor    Pre-Gait Activities markers on floor for equal step length. demo & verbal cues on equal step length with heels passing contralateral toe evenly. Progressed to not using marker.      Exercises   Exercises Knee/Hip      Knee/Hip Exercises: Aerobic   Nustep seat 10 level 7 with BLEs & BUEs for 8 min as warm up      Knee/Hip Exercises:  Standing   Rocker Board 1 minute   ant/mid/post & right/mid/left, light BUE support //bars   Rocker Board Limitations demo & verbal cues on technique      Knee/Hip Exercises: Seated   Sit to Sand 1 set;10 reps;without UE support   from 24" bar stool     Prosthetics   Prosthetic Care Comments  reviewed cleaning, antiperspirant and adju    Current prosthetic wear tolerance (days/week)  daily    Current prosthetic wear tolerance (#hours/day)  4hrs 2x/day increase to 5 hrs 2x/day.    Current prosthetic weight-bearing tolerance (hours/day)  pt tolerated 10 minutes for standing & gait without c/o limb pain or discomfort.    Edema pitting with 5 sec capillary refill    Residual limb condition  no open areas, scar invaginated slightly, sweaty skin, normal color & temperature, cylinderical shape    Education Provided Residual limb care;Other (comment);Correct ply sock adjustment   see prosthetic care comments   Person(s) Educated Patient    Education Method Explanation;Demonstration;Tactile cues;Verbal cues    Education Method Verbalized understanding;Returned demonstration;Tactile cues required;Verbal cues required;Needs further instruction    Donning Prosthesis Supervision                    PT Short Term Goals - 08/14/20 1031      PT SHORT TERM GOAL #1   Title Patient demonstrates proper donning & verbalizes proper cleaning of prosthesis & socks.    Time 1    Period Months    Status New    Target Date 09/14/20      PT SHORT TERM GOAL #2   Title Patient tolerates wear of prosthesis 12 hrs total / day with skin or limb pain issues.    Time 1    Period Months    Status New    Target Date 09/14/20      PT SHORT TERM GOAL #3   Title Patient reaches 10" and picks up items from floor without UE support safely.    Time 1    Period Months    Status New    Target Date 09/14/20      PT SHORT TERM GOAL #4   Title Patient ambulates 300' with cane & prosthesis with supervision.     Time 1    Period Months    Status New    Target Date 09/14/20      PT SHORT TERM GOAL #5   Title Patient negotiates ramps & curbs with cane & prosthesis with minA.    Time 1    Period Months    Status New    Target Date 09/14/20               PT Long Term Goals - 08/14/20 1027      PT LONG TERM GOAL #1   Title Patient demonstrates & verbalizes understanding of prosthetic care to enable safe utilization of prosthesis.    Time 10    Period Weeks    Status New    Target Date 10/19/20      PT LONG TERM GOAL #2   Title Patient tolerates wear of prosthesis >90% of awake hours without skin or limb pain issues to enable function throughout his day.    Time 10    Period Weeks    Status New    Target Date 10/19/20      PT LONG TERM GOAL #3   Title Berg Balance  >/= 45/56 to indicate lower fall risk.    Time 10    Period Weeks    Status New    Target Date 10/19/20      PT LONG TERM GOAL #4   Title Patient ambulates 500' with cane or less and prosthesis modified independent to enable community mobility.    Time 10    Period Weeks    Status New    Target Date 10/19/20      PT LONG TERM GOAL #5   Title Patient negotiates ramps, curbs & stairs with cane or less and prosthesis modified independent.    Time 10    Period Weeks    Status New    Target Date 10/19/20      Additional Long Term Goals   Additional Long Term Goals Yes      PT LONG TERM GOAL #6   Title Patient performs work simulation tasks including lifting & carrying 20# safely.    Time 10    Period Weeks    Status New    Target Date 10/19/20                 Plan - 08/24/20 0854    Clinical Impression Statement Patient is improving prosthetic gait with SBQC. PT included standing balance activities facilitating ankle/residual limb and hip strategies.    Personal Factors and Comorbidities Comorbidity 3+;Fitness    Comorbidities rt TTA, CKD, neuropathy, COPD w/ DOE, anemia, PAD s/p multiple  revascularizations, HTN    Examination-Activity Limitations Carry;Lift;Locomotion Level;Reach Overhead;Squat;Stairs;Stand;Transfers    Examination-Participation Restrictions Community Activity;Occupation    Stability/Clinical Decision Making Evolving/Moderate complexity    Rehab Potential Good    PT Frequency 2x / week    PT Duration Other (comment)   10 weeks   PT Treatment/Interventions ADLs/Self Care Home Management;DME Instruction;Gait training;Stair training;Functional mobility training;Therapeutic activities;Therapeutic exercise;Balance training;Neuromuscular re-education;Patient/family education;Prosthetic Training;Manual techniques;Vestibular    PT Next Visit Plan review prosthetic care, instruct in negotiating ramps & curbs with RW & SBQC & prosthesis, household gait with SBQC    Consulted and Agree with Plan of Care Patient           Patient will benefit from skilled therapeutic intervention in order to improve the following deficits and impairments:  Abnormal gait,Cardiopulmonary status limiting activity,Decreased activity tolerance,Decreased balance,Decreased knowledge of precautions,Decreased mobility,Decreased strength,Increased edema,Postural dysfunction,Prosthetic Dependency,Pain  Visit Diagnosis: Muscle weakness (generalized)  Unsteadiness on feet  Abnormal posture  Other abnormalities of gait and mobility     Problem List Patient Active Problem List   Diagnosis Date Noted  . Phantom limb pain (HCC)   . Essential hypertension   . AKI (acute kidney injury) (HCC)   . Acute blood loss anemia   . Postoperative pain   .   Below-knee amputation of right lower extremity (HCC) 05/17/2020  . Ischemia of right lower extremity 01/28/2019  . Iron deficiency anemia 11/23/2018  . Stage 3 chronic kidney disease (HCC) 10/21/2018  . Atherosclerosis of native arteries of extremity with rest pain (HCC) 10/13/2018  . PAD (peripheral artery disease) (HCC) 07/21/2018  . Ischemic  leg 06/25/2018  . Claudication (HCC) 03/17/2018  . B12 deficiency 03/12/2018  . Vitamin B1 deficiency 03/12/2018  . Varicose veins of leg with pain, right 03/12/2018  . Enlarged thoracic aorta (HCC) 02/24/2018  . Alcoholism /alcohol abuse 02/24/2018  . Paresthesia 02/24/2018  . Ectatic aorta (HCC) 02/20/2018  . Emphysema lung (HCC) 02/20/2018  . Atherosclerosis of aorta (HCC) 02/20/2018  . Encounter for tobacco use cessation counseling   . Benign neoplasm of cecum   . Benign neoplasm of transverse colon   . Benign neoplasm of sigmoid colon   . Hypertension, benign 08/31/2015  . Tobacco use 08/31/2015    Robin Waldron, PT, DPT 08/24/2020, 2:30 PM  [facility] [address] [place], [place], [address] Phone: [phone]   Fax:  [phone]  Name: Sahil M Lafave MRN: [id number] Date of Birth: 12/30/1955   

## 2020-08-29 ENCOUNTER — Ambulatory Visit (INDEPENDENT_AMBULATORY_CARE_PROVIDER_SITE_OTHER): Payer: 59 | Admitting: Physical Therapy

## 2020-08-29 ENCOUNTER — Other Ambulatory Visit: Payer: Self-pay

## 2020-08-29 ENCOUNTER — Other Ambulatory Visit: Payer: Self-pay | Admitting: Family Medicine

## 2020-08-29 ENCOUNTER — Encounter: Payer: Self-pay | Admitting: Physical Therapy

## 2020-08-29 DIAGNOSIS — R293 Abnormal posture: Secondary | ICD-10-CM

## 2020-08-29 DIAGNOSIS — M6281 Muscle weakness (generalized): Secondary | ICD-10-CM

## 2020-08-29 DIAGNOSIS — R2681 Unsteadiness on feet: Secondary | ICD-10-CM | POA: Diagnosis not present

## 2020-08-29 DIAGNOSIS — R2689 Other abnormalities of gait and mobility: Secondary | ICD-10-CM

## 2020-08-29 NOTE — Telephone Encounter (Signed)
Requested medication (s) are due for refill today: yes   Requested medication (s) are on the active medication list: yes  Last refill:  pantoprazole: 08/15/20     Plavix: 05/29/20  Future visit scheduled: no  Notes to clinic:  no refill protocol    Requested Prescriptions  Pending Prescriptions Disp Refills   pantoprazole (PROTONIX) 40 MG tablet [Pharmacy Med Name: Pantoprazole Sodium 40 MG Oral Tablet Delayed Release] 30 tablet 0    Sig: Take 1 tablet by mouth once daily      There is no refill protocol information for this order      clopidogrel (PLAVIX) 75 MG tablet [Pharmacy Med Name: Clopidogrel Bisulfate 75 MG Oral Tablet] 90 tablet 0    Sig: Take 1 tablet by mouth once daily      There is no refill protocol information for this order

## 2020-08-29 NOTE — Therapy (Signed)
[facility] [address] [place], [place], [address] Phone: [phone]   Fax:  [phone]  Physical Therapy Treatment  Patient Details  Name: Ryan [name] MRN: [id number] Date of Birth: 1956/01/08 Referring Provider (PT): Waynetta Sandy, MD   Encounter Date: 08/29/2020   PT End of Session - 08/29/20 1151    Visit Number 5    Number of Visits 20    Date for PT Re-Evaluation 10/19/20    Authorization Type [employer]    Authorization Time Period 20% coinsurance, $1500 OOP max not met    PT Start Time 1145    PT Stop Time 1230    PT Time Calculation (min) 45 min    Equipment Utilized During Treatment Gait belt    Activity Tolerance Patient tolerated treatment well    Behavior During Therapy [facility] for tasks assessed/performed           Past Medical History:  Diagnosis Date  . CKD (chronic kidney disease)   . History of blood transfusion   . Hyperlipidemia   . Hypertension   . Neuromuscular disorder (HCC)    numbness feet  . Peripheral vascular disease ([place])   . Shortness of breath dyspnea    occasional     Past Surgical History:  Procedure Laterality Date  . ABDOMINAL AORTOGRAM W/LOWER EXTREMITY N/A 09/13/2019   Procedure: ABDOMINAL AORTOGRAM W/LOWER EXTREMITY;  Surgeon: Waynetta Sandy, MD;  Location: [place] [facility];  Service: Cardiovascular;  Laterality: N/A;  . AMPUTATION Right 05/09/2020   Procedure: RIGHT BELOW KNEE AMPUTATION;  Surgeon: Waynetta Sandy, MD;  Location: [place];  Service: Vascular;  Laterality: Right;  w/ a block  . COLONOSCOPY WITH PROPOFOL N/A 02/15/2016   Procedure: COLONOSCOPY WITH PROPOFOL;  Surgeon: Lucilla Lame, MD;  Location: [place];  Service: Endoscopy;  Laterality: N/A;  . FEMORAL-POPLITEAL BYPASS GRAFT Right 09/14/2019   Procedure: BYPASS GRAFT FEMORAL-POPLITEAL ARTERY;  Surgeon: Waynetta Sandy, MD;  Location: [place];  Service: Vascular;   Laterality: Right;  . HERNIA REPAIR  1999   left inguinal  . INSERTION OF ILIAC STENT Right 09/14/2019   Procedure: Insertion Of Common and External Iliac Stent;  Surgeon: Waynetta Sandy, MD;  Location: [place];  Service: Vascular;  Laterality: Right;  . LOWER EXTREMITY ANGIOGRAPHY Right 05/07/2018   Procedure: LOWER EXTREMITY ANGIOGRAPHY;  Surgeon: Algernon Huxley, MD;  Location: [place] [facility];  Service: Cardiovascular;  Laterality: Right;  . LOWER EXTREMITY ANGIOGRAPHY Right 06/25/2018   Procedure: LOWER EXTREMITY ANGIOGRAPHY;  Surgeon: Algernon Huxley, MD;  Location: [place] [facility];  Service: Cardiovascular;  Laterality: Right;  . LOWER EXTREMITY ANGIOGRAPHY Right 07/01/2018   Procedure: LOWER EXTREMITY ANGIOGRAPHY;  Surgeon: Algernon Huxley, MD;  Location: [place] [facility];  Service: Cardiovascular;  Laterality: Right;  . LOWER EXTREMITY ANGIOGRAPHY Right 08/12/2018   Procedure: LOWER EXTREMITY ANGIOGRAPHY;  Surgeon: Algernon Huxley, MD;  Location: [place] [facility];  Service: Cardiovascular;  Laterality: Right;  . LOWER EXTREMITY ANGIOGRAPHY Right 09/03/2018   Procedure: LOWER EXTREMITY ANGIOGRAPHY;  Surgeon: Algernon Huxley, MD;  Location: [place] [facility];  Service: Cardiovascular;  Laterality: Right;  . LOWER EXTREMITY ANGIOGRAPHY Right 09/04/2018   Procedure: Lower Extremity Angiography;  Surgeon: Algernon Huxley, MD;  Location: [place] [facility];  Service: Cardiovascular;  Laterality: Right;  . LOWER EXTREMITY ANGIOGRAPHY Right 10/01/2018   Procedure: LOWER EXTREMITY ANGIOGRAPHY;  Surgeon: Algernon Huxley, MD;  Location: [place] [facility];  Service: Cardiovascular;  Laterality: Right;  . LOWER EXTREMITY ANGIOGRAPHY Right 10/01/2018   Procedure: Lower Extremity Angiography;  Surgeon: Algernon Huxley, MD;  Location: [place] [facility];  Service: Cardiovascular;  Laterality: Right;  . LOWER EXTREMITY ANGIOGRAPHY Right 10/15/2018   Procedure: LOWER EXTREMITY ANGIOGRAPHY;  Surgeon:  Algernon Huxley, MD;  Location: [place] [facility];  Service: Cardiovascular;  Laterality: Right;  . LOWER EXTREMITY ANGIOGRAPHY Left 10/16/2018   Procedure: Lower Extremity Angiography;  Surgeon: Algernon Huxley, MD;  Location: [place] [facility];  Service: Cardiovascular;  Laterality: Left;  . LOWER EXTREMITY ANGIOGRAPHY Left 01/20/2019   Procedure: LOWER EXTREMITY ANGIOGRAPHY;  Surgeon: Algernon Huxley, MD;  Location: [place] [facility];  Service: Cardiovascular;  Laterality: Left;  . LOWER EXTREMITY ANGIOGRAPHY Right 01/21/2019   Procedure: Lower Extremity Angiography;  Surgeon: Algernon Huxley, MD;  Location: [place] [facility];  Service: Cardiovascular;  Laterality: Right;  . LOWER EXTREMITY ANGIOGRAPHY Right 01/28/2019   Procedure: LOWER EXTREMITY ANGIOGRAPHY;  Surgeon: Algernon Huxley, MD;  Location: [place] [facility];  Service: Cardiovascular;  Laterality: Right;  . LOWER EXTREMITY ANGIOGRAPHY Right 01/29/2019   Procedure: Lower Extremity Angiography;  Surgeon: Algernon Huxley, MD;  Location: [place] [facility];  Service: Cardiovascular;  Laterality: Right;  . LOWER EXTREMITY ANGIOGRAPHY Right 04/04/2020   Procedure: LOWER EXTREMITY ANGIOGRAPHY;  Surgeon: Waynetta Sandy, MD;  Location: [place] [facility];  Service: Cardiovascular;  Laterality: Right;  . LOWER EXTREMITY INTERVENTION Right 07/02/2018   Procedure: LOWER EXTREMITY INTERVENTION;  Surgeon: Algernon Huxley, MD;  Location: [place] [facility];  Service: Cardiovascular;  Laterality: Right;  . PERIPHERAL VASCULAR BALLOON ANGIOPLASTY Left 04/04/2020   Procedure: PERIPHERAL VASCULAR BALLOON ANGIOPLASTY;  Surgeon: Waynetta Sandy, MD;  Location: [place] [facility];  Service: Cardiovascular;  Laterality: Left;  external iliac  . POLYPECTOMY N/A 02/15/2016   Procedure: POLYPECTOMY;  Surgeon: Lucilla Lame, MD;  Location: [place];  Service: Endoscopy;  Laterality: N/A;    There were no vitals filed for this visit.    Subjective Assessment - 08/29/20 1145    Subjective He thinks prosthesis is a little short. He has an appt with prosthetist, Brooke Pace on Monday and will have it checked.    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 No/denies    Pain Onset More than a month ago                             [facility] Adult PT Treatment/Exercise - 08/29/20 1145      Transfers   Transfers Sit to Stand;Stand to Sit    Sit to Stand 5: Supervision;With upper extremity assist;With armrests;From chair/3-in-1    Sit to Stand Details Visual cues/gestures for sequencing;Verbal cues for technique    Stand to Sit 5: Supervision;With upper extremity assist;With armrests;To chair/3-in-1    Stand to Sit Details (indicate cue type and reason) Visual cues/gestures for sequencing;Verbal cues for technique      Ambulation/Gait   Ambulation/Gait Yes    Ambulation/Gait Assistance 4: Min assist    Ambulation Distance (Feet) 100 Feet  100'X 2   Assistive device Rolling walker;Prosthesis;Small based quad cane   RW enter/exit, SBQC during session   Stairs Yes    Stairs Assistance 5: Supervision    Stairs Assistance Details (indicate cue type and reason) PT tactile & verbal cues on technique with single rail & cane.    Stair Management Technique One rail Right;One rail Left;With cane;Step to pattern;Forwards    Number of Stairs 4   2 with right rail & 2 with left rail   Height of Stairs 6    Ramp 3: Mod assist   SBQC & prosthesis   Ramp Details (indicate cue type and reason) tactile / manual & verbal cues on technique    Curb 4: Min assist   SBQC & prosthesis   Curb Details (indicate cue type and reason) tactile / manual & verbal cues on technique       Exercises   Exercises Knee/Hip      Knee/Hip Exercises: Aerobic   Nustep seat 10 level 7 with BLEs & BUEs for 8 min as warm up      Knee/Hip Exercises: Standing   Forward Step Up Right;Left;1 set;10 reps;Hand Hold: 2;Step Height: 6"    Forward Step Up Limitations demo & verbal cues on technique    Step Down Right;Left;1 set;10 reps;Hand Hold: 2;Step Height: 6"    Step Down Limitations demo & verbal cues on technique    Rocker Board 1 minute   ant/mid/post & right/mid/left, light BUE support //bars square board   Rocker Board Limitations verbal cues on technique      Knee/Hip Exercises: Seated   Sit to Sand 1 set;10 reps;without UE support   from 24" bar stool     Prosthetics   Prosthetic Care Comments  changing shoes & maintaining same pitch    Current prosthetic wear tolerance (days/week)  daily    Current prosthetic wear tolerance (#hours/day)  4hrs 2x/day increase to 5 hrs 2x/day.    Current prosthetic weight-bearing tolerance (hours/day)  pt tolerated 10 minutes for standing & gait without c/o limb pain or discomfort.    Edema pitting with 5 sec capillary refill    Residual limb condition  no open areas, scar invaginated slightly, sweaty skin, normal color & temperature, cylinderical shape    Education Provided Residual limb care;Other (comment);Correct ply sock adjustment;Proper wear schedule/adjustment   see prosthetic care comments   Person(s) Educated Patient    Education Method Explanation;Demonstration;Tactile cues;Verbal cues    Education Method Verbalized understanding;Tactile cues required;Verbal cues required;Needs further instruction                    PT Short Term Goals - 08/14/20 1031      PT SHORT TERM GOAL #1   Title Patient demonstrates proper donning & verbalizes proper cleaning of prosthesis & socks.    Time 1    Period Months    Status New    Target Date 09/14/20      PT SHORT TERM GOAL #2   Title Patient tolerates wear of prosthesis 12  hrs total / day with skin or limb pain issues.    Time 1    Period Months    Status New    Target Date 09/14/20      PT SHORT TERM GOAL #3   Title Patient reaches 10" and picks up items from floor without UE support safely.    Time 1    Period Months    Status New  Target Date 09/14/20      PT SHORT TERM GOAL #4   Title Patient ambulates 300' with cane & prosthesis with supervision.    Time 1    Period Months    Status New    Target Date 09/14/20      PT SHORT TERM GOAL #5   Title Patient negotiates ramps & curbs with cane & prosthesis with minA.    Time 1    Period Months    Status New    Target Date 09/14/20             PT Long Term Goals - 08/14/20 1027      PT LONG TERM GOAL #1   Title Patient demonstrates & verbalizes understanding of prosthetic care to enable safe utilization of prosthesis.    Time 10    Period Weeks    Status New    Target Date 10/19/20      PT LONG TERM GOAL #2   Title Patient tolerates wear of prosthesis >90% of awake hours without skin or limb pain issues to enable function throughout his day.    Time 10    Period Weeks    Status New    Target Date 10/19/20      PT LONG TERM GOAL #3   Title Berg Balance  >/= 45/56 to indicate lower fall risk.    Time 10    Period Weeks    Status New    Target Date 10/19/20      PT LONG TERM GOAL #4   Title Patient ambulates 500' with cane or less and prosthesis modified independent to enable community mobility.    Time 10    Period Weeks    Status New    Target Date 10/19/20      PT LONG TERM GOAL #5   Title Patient negotiates ramps, curbs & stairs with cane or less and prosthesis modified independent.    Time 10    Period Weeks    Status New    Target Date 10/19/20      Additional Long Term Goals   Additional Long Term Goals Yes      PT LONG TERM GOAL #6   Title Patient performs work simulation tasks including lifting & carrying 20# safely.    Time 10    Period Weeks    Status  New    Target Date 10/19/20                 Plan - 08/29/20 1151    Clinical Impression Statement Patient is on target to meet STGs. He is progressing well with wear, care, balance & gait with prosthesis.    Personal Factors and Comorbidities Comorbidity 3+;Fitness    Comorbidities rt TTA, CKD, neuropathy, COPD w/ DOE, anemia, PAD s/p multiple revascularizations, HTN    Examination-Activity Limitations Carry;Lift;Locomotion Level;Reach Overhead;Squat;Stairs;Stand;Transfers    Examination-Participation Restrictions Community Activity;Occupation    Stability/Clinical Decision Making Evolving/Moderate complexity    Rehab Potential Good    PT Frequency 2x / week    PT Duration Other (comment)   10 weeks   PT Treatment/Interventions ADLs/Self Care Home Management;DME Instruction;Gait training;Stair training;Functional mobility training;Therapeutic activities;Therapeutic exercise;Balance training;Neuromuscular re-education;Patient/family education;Prosthetic Training;Manual techniques;Vestibular    PT Next Visit Plan review prosthetic care, instruct in negotiating ramps & curbs with RW & SBQC & prosthesis, household gait with SBQC    Consulted and Agree with Plan of Care Patient  Patient will benefit from skilled therapeutic intervention in order to improve the following deficits and impairments:  Abnormal gait,Cardiopulmonary status limiting activity,Decreased activity tolerance,Decreased balance,Decreased knowledge of precautions,Decreased mobility,Decreased strength,Increased edema,Postural dysfunction,Prosthetic Dependency,Pain  Visit Diagnosis: Muscle weakness (generalized)  Unsteadiness on feet  Abnormal posture  Other abnormalities of gait and mobility     Problem List Patient Active Problem List   Diagnosis Date Noted  . Phantom limb pain ([place])   . Essential hypertension   . AKI (acute kidney injury) ([place])   . Acute blood loss anemia   . Postoperative  pain   . Below-knee amputation of right lower extremity ([place]) 05/17/2020  . Ischemia of right lower extremity 01/28/2019  . Iron deficiency anemia 11/23/2018  . Stage 3 chronic kidney disease ([place]) 10/21/2018  . Atherosclerosis of native arteries of extremity with rest pain ([place]) 10/13/2018  . PAD (peripheral artery disease) ([place]) 07/21/2018  . Ischemic leg 06/25/2018  . Claudication ([place]) 03/17/2018  . B12 deficiency 03/12/2018  . Vitamin B1 deficiency 03/12/2018  . Varicose veins of leg with pain, right 03/12/2018  . Enlarged thoracic aorta ([place]) 02/24/2018  . Alcoholism /alcohol abuse 02/24/2018  . Paresthesia 02/24/2018  . Ectatic aorta ([place]) 02/20/2018  . Emphysema lung ([place]) 02/20/2018  . Atherosclerosis of aorta ([place]) 02/20/2018  . Encounter for tobacco use cessation counseling   . Benign neoplasm of cecum   . Benign neoplasm of transverse colon   . Benign neoplasm of sigmoid colon   . Hypertension, benign 08/31/2015  . Tobacco use 08/31/2015    Jamey Reas, PT, DPT 08/29/2020, 12:51 PM  [facility] [address] [place], [place], [address] Phone: [phone]   Fax:  [phone]  Name: RAMARI BRAY MRN: [id number] Date of Birth: 09-03-1955

## 2020-08-31 ENCOUNTER — Other Ambulatory Visit: Payer: Self-pay

## 2020-08-31 ENCOUNTER — Ambulatory Visit (INDEPENDENT_AMBULATORY_CARE_PROVIDER_SITE_OTHER): Payer: 59 | Admitting: Physical Therapy

## 2020-08-31 ENCOUNTER — Encounter: Payer: 59 | Admitting: Physical Therapy

## 2020-08-31 ENCOUNTER — Encounter: Payer: Self-pay | Admitting: Physical Therapy

## 2020-08-31 DIAGNOSIS — M6281 Muscle weakness (generalized): Secondary | ICD-10-CM | POA: Diagnosis not present

## 2020-08-31 DIAGNOSIS — R293 Abnormal posture: Secondary | ICD-10-CM

## 2020-08-31 DIAGNOSIS — R2681 Unsteadiness on feet: Secondary | ICD-10-CM | POA: Diagnosis not present

## 2020-08-31 DIAGNOSIS — R2689 Other abnormalities of gait and mobility: Secondary | ICD-10-CM | POA: Diagnosis not present

## 2020-08-31 NOTE — Therapy (Signed)
[name] [address] [place], [place], [address] Phone: [phone]   Fax:  [phone]  Physical Therapy Treatment  Patient Details  Name: Ryan [name] MRN: [id number] Date of Birth: 08/25/1955 Referring Provider (PT): Waynetta Sandy, MD   Encounter Date: 08/31/2020   PT End of Session - 08/31/20 1145    Visit Number 6    Number of Visits 20    Date for PT Re-Evaluation 10/19/20    Authorization Type [employer]    Authorization Time Period 20% coinsurance, $1500 OOP max not met    PT Start Time 1145    PT Stop Time 1227    PT Time Calculation (min) 42 min    Equipment Utilized During Treatment Gait belt    Activity Tolerance Patient tolerated treatment well    Behavior During Therapy WFL for tasks assessed/performed           Past Medical History:  Diagnosis Date  . CKD (chronic kidney disease)   . History of blood transfusion   . Hyperlipidemia   . Hypertension   . Neuromuscular disorder (HCC)    numbness feet  . Peripheral vascular disease ([place])   . Shortness of breath dyspnea    occasional     Past Surgical History:  Procedure Laterality Date  . ABDOMINAL AORTOGRAM W/LOWER EXTREMITY N/A 09/13/2019   Procedure: ABDOMINAL AORTOGRAM W/LOWER EXTREMITY;  Surgeon: Waynetta Sandy, MD;  Location: [place] [facility];  Service: Cardiovascular;  Laterality: N/A;  . AMPUTATION Right 05/09/2020   Procedure: RIGHT BELOW KNEE AMPUTATION;  Surgeon: Waynetta Sandy, MD;  Location: [place];  Service: Vascular;  Laterality: Right;  w/ a block  . COLONOSCOPY WITH PROPOFOL N/A 02/15/2016   Procedure: COLONOSCOPY WITH PROPOFOL;  Surgeon: Lucilla Lame, MD;  Location: [place];  Service: Endoscopy;  Laterality: N/A;  . FEMORAL-POPLITEAL BYPASS GRAFT Right 09/14/2019   Procedure: BYPASS GRAFT FEMORAL-POPLITEAL ARTERY;  Surgeon: Waynetta Sandy, MD;  Location: [place];  Service: Vascular;   Laterality: Right;  . HERNIA REPAIR  1999   left inguinal  . INSERTION OF ILIAC STENT Right 09/14/2019   Procedure: Insertion Of Common and External Iliac Stent;  Surgeon: Waynetta Sandy, MD;  Location: [place];  Service: Vascular;  Laterality: Right;  . LOWER EXTREMITY ANGIOGRAPHY Right 05/07/2018   Procedure: LOWER EXTREMITY ANGIOGRAPHY;  Surgeon: Algernon Huxley, MD;  Location: [place] [facility];  Service: Cardiovascular;  Laterality: Right;  . LOWER EXTREMITY ANGIOGRAPHY Right 06/25/2018   Procedure: LOWER EXTREMITY ANGIOGRAPHY;  Surgeon: Algernon Huxley, MD;  Location: [place] [facility];  Service: Cardiovascular;  Laterality: Right;  . LOWER EXTREMITY ANGIOGRAPHY Right 07/01/2018   Procedure: LOWER EXTREMITY ANGIOGRAPHY;  Surgeon: Algernon Huxley, MD;  Location: [place] [facility];  Service: Cardiovascular;  Laterality: Right;  . LOWER EXTREMITY ANGIOGRAPHY Right 08/12/2018   Procedure: LOWER EXTREMITY ANGIOGRAPHY;  Surgeon: Algernon Huxley, MD;  Location: [place] [facility];  Service: Cardiovascular;  Laterality: Right;  . LOWER EXTREMITY ANGIOGRAPHY Right 09/03/2018   Procedure: LOWER EXTREMITY ANGIOGRAPHY;  Surgeon: Algernon Huxley, MD;  Location: [place] [facility];  Service: Cardiovascular;  Laterality: Right;  . LOWER EXTREMITY ANGIOGRAPHY Right 09/04/2018   Procedure: Lower Extremity Angiography;  Surgeon: Algernon Huxley, MD;  Location: [place] [facility];  Service: Cardiovascular;  Laterality: Right;  . LOWER EXTREMITY ANGIOGRAPHY Right 10/01/2018   Procedure: LOWER EXTREMITY ANGIOGRAPHY;  Surgeon: Algernon Huxley, MD;  Location: [place] [facility];  Service: Cardiovascular;  Laterality: Right;  . LOWER EXTREMITY ANGIOGRAPHY Right 10/01/2018   Procedure: Lower Extremity Angiography;  Surgeon: Algernon Huxley, MD;  Location: [place] [facility];  Service: Cardiovascular;  Laterality: Right;  . LOWER EXTREMITY ANGIOGRAPHY Right 10/15/2018   Procedure: LOWER EXTREMITY ANGIOGRAPHY;  Surgeon:  Algernon Huxley, MD;  Location: [place] [facility];  Service: Cardiovascular;  Laterality: Right;  . LOWER EXTREMITY ANGIOGRAPHY Left 10/16/2018   Procedure: Lower Extremity Angiography;  Surgeon: Algernon Huxley, MD;  Location: [place] [facility];  Service: Cardiovascular;  Laterality: Left;  . LOWER EXTREMITY ANGIOGRAPHY Left 01/20/2019   Procedure: LOWER EXTREMITY ANGIOGRAPHY;  Surgeon: Algernon Huxley, MD;  Location: [place] [facility];  Service: Cardiovascular;  Laterality: Left;  . LOWER EXTREMITY ANGIOGRAPHY Right 01/21/2019   Procedure: Lower Extremity Angiography;  Surgeon: Algernon Huxley, MD;  Location: [place] [facility];  Service: Cardiovascular;  Laterality: Right;  . LOWER EXTREMITY ANGIOGRAPHY Right 01/28/2019   Procedure: LOWER EXTREMITY ANGIOGRAPHY;  Surgeon: Algernon Huxley, MD;  Location: [place] [facility];  Service: Cardiovascular;  Laterality: Right;  . LOWER EXTREMITY ANGIOGRAPHY Right 01/29/2019   Procedure: Lower Extremity Angiography;  Surgeon: Algernon Huxley, MD;  Location: [place] [facility];  Service: Cardiovascular;  Laterality: Right;  . LOWER EXTREMITY ANGIOGRAPHY Right 04/04/2020   Procedure: LOWER EXTREMITY ANGIOGRAPHY;  Surgeon: Waynetta Sandy, MD;  Location: [place] [facility];  Service: Cardiovascular;  Laterality: Right;  . LOWER EXTREMITY INTERVENTION Right 07/02/2018   Procedure: LOWER EXTREMITY INTERVENTION;  Surgeon: Algernon Huxley, MD;  Location: [place] [facility];  Service: Cardiovascular;  Laterality: Right;  . PERIPHERAL VASCULAR BALLOON ANGIOPLASTY Left 04/04/2020   Procedure: PERIPHERAL VASCULAR BALLOON ANGIOPLASTY;  Surgeon: Waynetta Sandy, MD;  Location: [place] [facility];  Service: Cardiovascular;  Laterality: Left;  external iliac  . POLYPECTOMY N/A 02/15/2016   Procedure: POLYPECTOMY;  Surgeon: Lucilla Lame, MD;  Location: [place];  Service: Endoscopy;  Laterality: N/A;    There were no vitals filed for this visit.    Subjective Assessment - 08/31/20 1154    Subjective He is wearing prosthesis most of awake hours without issues.    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 No/denies    Pain Onset More than a month ago                             [facility] Adult PT Treatment/Exercise - 08/31/20 1145      Transfers   Transfers Sit to Stand;Stand to Sit    Sit to Stand 5: Supervision;With upper extremity assist;With armrests;From chair/3-in-1    Sit to Stand Details Visual cues/gestures for sequencing;Verbal cues for technique    Stand to Sit 5: Supervision;With upper extremity assist;With armrests;To chair/3-in-1    Stand to Sit Details (indicate cue type and reason) Visual cues/gestures for sequencing;Verbal cues for technique      Ambulation/Gait   Ambulation/Gait Yes    Ambulation/Gait Assistance 5: Supervision    Ambulation/Gait Assistance Details switched to straight cane stand alone tip to decrease amount of UE weight bearing.  PT demo & verbal cues ambulating inside //bars without UE support 7'  X 3.    Ambulation Distance (Feet) 100 Feet   100'X 2cane & 7'X3 no device   Assistive device Prosthesis;Straight cane   straight cane stand alone tip & no device inside //bars   Ambulation Surface Level;Indoor    Stairs --    [employer] --    Stair Management Technique --    Number of Stairs --    Height of Stairs --    Ramp 4: Min assist   cane stand alone tip & prosthesis   Ramp Details (indicate cue type and reason) tactile / manual & verbal cues on technique    Curb 4: Min assist   cane stand alone tip & prosthesis     High Level Balance   High Level Balance Activities Side stepping;Backward  walking;Turns;Negotiating over obstacles;Other (comment)   //bars   High Level Balance Comments demo & vrebal cues on technique      Exercises   Exercises Knee/Hip      Knee/Hip Exercises: Aerobic   Nustep --      Knee/Hip Exercises: Machines for Strengthening   Cybex Leg Press shuttle BLEs 100# 10 reps 2 sets.      Knee/Hip Exercises: Standing   Forward Step Up Right;Left;1 set;10 reps;Hand Hold: 2;Step Height: 6"    Forward Step Up Limitations demo & verbal cues on technique    Step Down Right;Left;1 set;10 reps;Hand Hold: 2;Step Height: 6"    Step Down Limitations demo & verbal cues on technique    Rocker Board 1 minute   ant/mid/post & right/mid/left, light BUE support //bars round Occupational hygienist Board Limitations verbal cues on technique      Knee/Hip Exercises: Seated   Sit to Sand 1 set;10 reps;without UE support   from 24" bar stool     Prosthetics   Prosthetic Care Comments  PT recommended switching liners after dinner time so can clean it prior to bedtime.    Current prosthetic wear tolerance (days/week)  daily    Current prosthetic wear tolerance (#hours/day)  most of awake hours    Current prosthetic weight-bearing tolerance (hours/day)  pt tolerated 10 minutes for standing & gait without c/o limb pain or discomfort.    Edema pitting with 5 sec capillary refill    Residual limb condition  no open areas, scar invaginated slightly, sweaty skin, normal color & temperature, cylinderical shape    Education Provided Other (comment);Proper wear schedule/adjustment   see prosthetic care comments   Person(s) Educated Patient    Education Method Explanation;Verbal cues    Education Method Verbalized understanding;Verbal cues required;Needs further instruction                    PT Short Term Goals - 08/14/20 1031      PT SHORT TERM GOAL #1   Title Patient demonstrates proper donning & verbalizes proper cleaning of prosthesis & socks.    Time 1    Period Months     Status New    Target Date 09/14/20      PT SHORT TERM GOAL #2   Title Patient tolerates wear of prosthesis 12 hrs total / day with skin or limb pain issues.    Time 1    Period Months    Status New    Target Date 09/14/20      PT SHORT TERM GOAL #3   Title Patient reaches 10" and  picks up items from floor without UE support safely.    Time 1    Period Months    Status New    Target Date 09/14/20      PT SHORT TERM GOAL #4   Title Patient ambulates 300' with cane & prosthesis with supervision.    Time 1    Period Months    Status New    Target Date 09/14/20      PT SHORT TERM GOAL #5   Title Patient negotiates ramps & curbs with cane & prosthesis with minA.    Time 1    Period Months    Status New    Target Date 09/14/20             PT Long Term Goals - 08/14/20 1027      PT LONG TERM GOAL #1   Title Patient demonstrates & verbalizes understanding of prosthetic care to enable safe utilization of prosthesis.    Time 10    Period Weeks    Status New    Target Date 10/19/20      PT LONG TERM GOAL #2   Title Patient tolerates wear of prosthesis >90% of awake hours without skin or limb pain issues to enable function throughout his day.    Time 10    Period Weeks    Status New    Target Date 10/19/20      PT LONG TERM GOAL #3   Title [place] Balance  >/= 45/56 to indicate lower fall risk.    Time 10    Period Weeks    Status New    Target Date 10/19/20      PT LONG TERM GOAL #4   Title Patient ambulates 500' with cane or less and prosthesis modified independent to enable community mobility.    Time 10    Period Weeks    Status New    Target Date 10/19/20      PT LONG TERM GOAL #5   Title Patient negotiates ramps, curbs & stairs with cane or less and prosthesis modified independent.    Time 10    Period Weeks    Status New    Target Date 10/19/20      Additional Long Term Goals   Additional Long Term Goals Yes      PT LONG TERM GOAL #6   Title  Patient performs work simulation tasks including lifting & carrying 20# safely.    Time 10    Period Weeks    Status New    Target Date 10/19/20                 Plan - 08/31/20 1153    Clinical Impression Statement PT progressed prosthetic gait from [facility] to cane stand alone tip for less UE weight bearing and he did well.  PT progressed standing activities to progress balance and instruct in moving sideways, backwards, turning 90* and stepping over items.  He will need more training before performing outside of PT.    Personal Factors and Comorbidities Comorbidity 3+;Fitness    Comorbidities rt TTA, CKD, neuropathy, COPD w/ DOE, anemia, PAD s/p multiple revascularizations, HTN    Examination-Activity Limitations Carry;Lift;Locomotion Level;Reach Overhead;Squat;Stairs;Stand;Transfers    Examination-Participation Restrictions Community Activity;Occupation    Stability/Clinical Decision Making Evolving/Moderate complexity    Rehab Potential Good    PT Frequency 2x / week    PT Duration Other (comment)   10 weeks   PT Treatment/Interventions ADLs/Self Care  Home Management;DME Instruction;Gait training;Stair training;Functional mobility training;Therapeutic activities;Therapeutic exercise;Balance training;Neuromuscular re-education;Patient/family education;Prosthetic Training;Manual techniques;Vestibular    PT Next Visit Plan review prosthetic care, instruct in negotiating ramps & curbs with cane stand alone tip & prosthesis    Consulted and Agree with Plan of Care Patient           Patient will benefit from skilled therapeutic intervention in order to improve the following deficits and impairments:  Abnormal gait,Cardiopulmonary status limiting activity,Decreased activity tolerance,Decreased balance,Decreased knowledge of precautions,Decreased mobility,Decreased strength,Increased edema,Postural dysfunction,Prosthetic Dependency,Pain  Visit Diagnosis: Muscle weakness  (generalized)  Unsteadiness on feet  Abnormal posture  Other abnormalities of gait and mobility     Problem List Patient Active Problem List   Diagnosis Date Noted  . Phantom limb pain ([place])   . Essential hypertension   . AKI (acute kidney injury) ([place])   . Acute blood loss anemia   . Postoperative pain   . Below-knee amputation of right lower extremity ([place]) 05/17/2020  . Ischemia of right lower extremity 01/28/2019  . Iron deficiency anemia 11/23/2018  . Stage 3 chronic kidney disease ([place]) 10/21/2018  . Atherosclerosis of native arteries of extremity with rest pain ([place]) 10/13/2018  . PAD (peripheral artery disease) ([place]) 07/21/2018  . Ischemic leg 06/25/2018  . Claudication ([place]) 03/17/2018  . B12 deficiency 03/12/2018  . Vitamin B1 deficiency 03/12/2018  . Varicose veins of leg with pain, right 03/12/2018  . Enlarged thoracic aorta ([place]) 02/24/2018  . Alcoholism /alcohol abuse 02/24/2018  . Paresthesia 02/24/2018  . Ectatic aorta ([place]) 02/20/2018  . Emphysema lung ([place]) 02/20/2018  . Atherosclerosis of aorta ([place]) 02/20/2018  . Encounter for tobacco use cessation counseling   . Benign neoplasm of cecum   . Benign neoplasm of transverse colon   . Benign neoplasm of sigmoid colon   . Hypertension, benign 08/31/2015  . Tobacco use 08/31/2015    Jamey Reas, PT, DPT 08/31/2020, 12:40 PM  [facility] [address] [place], [place], [address] Phone: [phone]   Fax:  [phone]  Name: Ryan [name] MRN: [id number] Date of Birth: 06-04-1956

## 2020-09-05 ENCOUNTER — Telehealth: Payer: Self-pay | Admitting: Physical Therapy

## 2020-09-05 ENCOUNTER — Encounter: Payer: Self-pay | Admitting: Physical Therapy

## 2020-09-05 ENCOUNTER — Other Ambulatory Visit: Payer: Self-pay

## 2020-09-05 ENCOUNTER — Ambulatory Visit (INDEPENDENT_AMBULATORY_CARE_PROVIDER_SITE_OTHER): Payer: 59 | Admitting: Physical Therapy

## 2020-09-05 DIAGNOSIS — R293 Abnormal posture: Secondary | ICD-10-CM | POA: Diagnosis not present

## 2020-09-05 DIAGNOSIS — R2689 Other abnormalities of gait and mobility: Secondary | ICD-10-CM | POA: Diagnosis not present

## 2020-09-05 DIAGNOSIS — M6281 Muscle weakness (generalized): Secondary | ICD-10-CM | POA: Diagnosis not present

## 2020-09-05 DIAGNOSIS — R2681 Unsteadiness on feet: Secondary | ICD-10-CM

## 2020-09-05 NOTE — Telephone Encounter (Signed)
Ryan [name] is experiencing overnight fluid retention in his residual limb with his current prosthetic shrinker.  His limb has shrunk with prosthesis wear as typical. His limb measures a large or number III size in [place] now. Can you please FAX [phone] a prescription for 2 new Vivewear shrinkers size Large / III ?  Thank you Jamey Reas, PT, DPT Physical Therapist Specializing in Prosthetic Rehab [facility]. [address] [place], [place] [address] Phone [phone] FAX [phone]

## 2020-09-05 NOTE — Therapy (Signed)
[facility] Physical Therapy [address] [place], [place], [address] Phone: [phone]   Fax:  [phone]  Physical Therapy Treatment  Patient Details  Name: Ryan [name] MRN: [id number] Date of Birth: [birth year] Referring Provider (PT): Waynetta Sandy, MD   Encounter Date: 09/05/2020   PT End of Session - 09/05/20 1151    Visit Number 7    Number of Visits 20    Date for PT Re-Evaluation 10/19/20    Authorization Type [employer]    Authorization Time Period 20% coinsurance, $1500 OOP max not met    PT Start Time 1145    PT Stop Time 1229    PT Time Calculation (min) 44 min    Equipment Utilized During Treatment Gait belt    Activity Tolerance Patient tolerated treatment well    Behavior During Therapy WFL for tasks assessed/performed           Past Medical History:  Diagnosis Date  . CKD (chronic kidney disease)   . History of blood transfusion   . Hyperlipidemia   . Hypertension   . Neuromuscular disorder (HCC)    numbness feet  . Peripheral vascular disease ([place])   . Shortness of breath dyspnea    occasional     Past Surgical History:  Procedure Laterality Date  . ABDOMINAL AORTOGRAM W/LOWER EXTREMITY N/A 09/13/2019   Procedure: ABDOMINAL AORTOGRAM W/LOWER EXTREMITY;  Surgeon: Waynetta Sandy, MD;  Location: [place] [facility];  Service: Cardiovascular;  Laterality: N/A;  . AMPUTATION Right 05/09/2020   Procedure: RIGHT BELOW KNEE AMPUTATION;  Surgeon: Waynetta Sandy, MD;  Location: [place];  Service: Vascular;  Laterality: Right;  w/ a block  . COLONOSCOPY WITH PROPOFOL N/A 02/15/2016   Procedure: COLONOSCOPY WITH PROPOFOL;  Surgeon: Lucilla Lame, MD;  Location: [place];  Service: Endoscopy;  Laterality: N/A;  . FEMORAL-POPLITEAL BYPASS GRAFT Right 09/14/2019   Procedure: BYPASS GRAFT FEMORAL-POPLITEAL ARTERY;  Surgeon: Waynetta Sandy, MD;  Location: [place];  Service: Vascular;   Laterality: Right;  . HERNIA REPAIR  1999   left inguinal  . INSERTION OF ILIAC STENT Right 09/14/2019   Procedure: Insertion Of Common and External Iliac Stent;  Surgeon: Waynetta Sandy, MD;  Location: [place];  Service: Vascular;  Laterality: Right;  . LOWER EXTREMITY ANGIOGRAPHY Right 05/07/2018   Procedure: LOWER EXTREMITY ANGIOGRAPHY;  Surgeon: Algernon Huxley, MD;  Location: [place] [facility];  Service: Cardiovascular;  Laterality: Right;  . LOWER EXTREMITY ANGIOGRAPHY Right 06/25/2018   Procedure: LOWER EXTREMITY ANGIOGRAPHY;  Surgeon: Algernon Huxley, MD;  Location: [place] [facility];  Service: Cardiovascular;  Laterality: Right;  . LOWER EXTREMITY ANGIOGRAPHY Right 07/01/2018   Procedure: LOWER EXTREMITY ANGIOGRAPHY;  Surgeon: Algernon Huxley, MD;  Location: [place] [facility];  Service: Cardiovascular;  Laterality: Right;  . LOWER EXTREMITY ANGIOGRAPHY Right 08/12/2018   Procedure: LOWER EXTREMITY ANGIOGRAPHY;  Surgeon: Algernon Huxley, MD;  Location: [place] [facility];  Service: Cardiovascular;  Laterality: Right;  . LOWER EXTREMITY ANGIOGRAPHY Right 09/03/2018   Procedure: LOWER EXTREMITY ANGIOGRAPHY;  Surgeon: Algernon Huxley, MD;  Location: [place] [facility];  Service: Cardiovascular;  Laterality: Right;  . LOWER EXTREMITY ANGIOGRAPHY Right 09/04/2018   Procedure: Lower Extremity Angiography;  Surgeon: Algernon Huxley, MD;  Location: [place] [facility];  Service: Cardiovascular;  Laterality: Right;  . LOWER EXTREMITY ANGIOGRAPHY Right 10/01/2018   Procedure: LOWER EXTREMITY ANGIOGRAPHY;  Surgeon: Algernon Huxley, MD;  Location: [place] [facility];  Service: Cardiovascular;  Laterality: Right;  . LOWER EXTREMITY ANGIOGRAPHY Right 10/01/2018   Procedure: Lower Extremity Angiography;  Surgeon: Algernon Huxley, MD;  Location: [place] [facility];  Service: Cardiovascular;  Laterality: Right;  . LOWER EXTREMITY ANGIOGRAPHY Right 10/15/2018   Procedure: LOWER EXTREMITY ANGIOGRAPHY;  Surgeon:  Algernon Huxley, MD;  Location: [place] [facility];  Service: Cardiovascular;  Laterality: Right;  . LOWER EXTREMITY ANGIOGRAPHY Left 10/16/2018   Procedure: Lower Extremity Angiography;  Surgeon: Algernon Huxley, MD;  Location: [place] [facility];  Service: Cardiovascular;  Laterality: Left;  . LOWER EXTREMITY ANGIOGRAPHY Left 01/20/2019   Procedure: LOWER EXTREMITY ANGIOGRAPHY;  Surgeon: Algernon Huxley, MD;  Location: [place] [facility];  Service: Cardiovascular;  Laterality: Left;  . LOWER EXTREMITY ANGIOGRAPHY Right 01/21/2019   Procedure: Lower Extremity Angiography;  Surgeon: Algernon Huxley, MD;  Location: [place] [facility];  Service: Cardiovascular;  Laterality: Right;  . LOWER EXTREMITY ANGIOGRAPHY Right 01/28/2019   Procedure: LOWER EXTREMITY ANGIOGRAPHY;  Surgeon: Algernon Huxley, MD;  Location: [place] [facility];  Service: Cardiovascular;  Laterality: Right;  . LOWER EXTREMITY ANGIOGRAPHY Right 01/29/2019   Procedure: Lower Extremity Angiography;  Surgeon: Algernon Huxley, MD;  Location: [place] [facility];  Service: Cardiovascular;  Laterality: Right;  . LOWER EXTREMITY ANGIOGRAPHY Right 04/04/2020   Procedure: LOWER EXTREMITY ANGIOGRAPHY;  Surgeon: Waynetta Sandy, MD;  Location: [place] [facility];  Service: Cardiovascular;  Laterality: Right;  . LOWER EXTREMITY INTERVENTION Right 07/02/2018   Procedure: LOWER EXTREMITY INTERVENTION;  Surgeon: Algernon Huxley, MD;  Location: [place] [facility];  Service: Cardiovascular;  Laterality: Right;  . PERIPHERAL VASCULAR BALLOON ANGIOPLASTY Left 04/04/2020   Procedure: PERIPHERAL VASCULAR BALLOON ANGIOPLASTY;  Surgeon: Waynetta Sandy, MD;  Location: [place] [facility];  Service: Cardiovascular;  Laterality: Left;  external iliac  . POLYPECTOMY N/A 02/15/2016   Procedure: POLYPECTOMY;  Surgeon: Lucilla Lame, MD;  Location: [place];  Service: Endoscopy;  Laterality: N/A;    There were no vitals filed for this visit.    Subjective Assessment - 09/05/20 1146    Subjective He is wearing prosthesis after morning bath (~1 hr after arising) to bed time and drying limb/liner every 4 hours.  It is wet from sweat still.  He saw prosthetist yesterday who add liner to socket, lengthen it and aligned.  It feels better.    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 No/denies    Pain Onset More than a month ago                             [facility] Adult PT Treatment/Exercise - 09/05/20 1145      Transfers   Transfers Sit to Stand;Stand to Sit    Sit to Stand 5: Supervision;With upper extremity assist;With armrests;From chair/3-in-1    Sit to Stand Details Visual cues/gestures for sequencing;Verbal cues for technique    Stand to Sit 5: Supervision;With upper extremity assist;With armrests;To chair/3-in-1    Stand to Sit Details (indicate cue type and reason) Visual cues/gestures for sequencing;Verbal cues for technique  Ambulation/Gait   Ambulation/Gait Yes    Ambulation/Gait Assistance 5: Supervision    Ambulation Distance (Feet) 300 Feet   300' cane & 50' no device except prosthesis   Assistive device Prosthesis;Straight cane;None   straight cane stand alone tip & no device inside //bars   Ambulation Surface Level;Indoor;Outdoor;Paved    Ramp 5: Supervision   cane stand alone tip & prosthesis   Ramp Details (indicate cue type and reason) verbal cues on technique    Curb 5: Supervision   cane stand alone tip & prosthesis   Curb Details (indicate cue type and reason) verbal cues on technique      High Level Balance   High Level Balance Activities --    High Level Balance Comments --      Exercises   Exercises Knee/Hip      Knee/Hip  Exercises: Stretches   Active Hamstring Stretch Right;2 reps;20 seconds    Active Hamstring Stretch Limitations supine straight leg with strap DF      Knee/Hip Exercises: Aerobic   Nustep seat 10 level 7 with BLEs & BUEs for 8 min as warm up      Knee/Hip Exercises: Machines for Strengthening   Cybex Leg Press shuttle BLEs 106# 20 reps 2 sets 1st set back 45* & 2nd set back flat      Knee/Hip Exercises: Standing   Forward Step Up --    Forward Step Up Limitations --    Step Down --    Step Down Limitations --    Rocker Board --    Rocker Board Limitations --      Knee/Hip Exercises: Seated   Sit to [employer] --      Prosthetics   Prosthetic Care Comments  PT recommended donning upon arising, take off to bathe, then redonning after drying limb, wear all awake hours drying prn or q4hrs and switching liners after dinner time so can clean it prior to bedtime.  PT measured his residual limb with thigh 35cm & calf 31cm which indicates a Vivewear size large / III shrinker.  If shrinker is too large with current size of his limb, then he may retain fluid overnight making morning fit of prosthesis difficult.    Current prosthetic wear tolerance (days/week)  daily    Current prosthetic wear tolerance (#hours/day)  most of awake hours    Current prosthetic weight-bearing tolerance (hours/day)  pt tolerated 10 minutes for standing & gait without c/o limb pain or discomfort.    Edema pitting with 5 sec capillary refill    Residual limb condition  no open areas, scar invaginated slightly, sweaty skin, normal color & temperature, cylinderical shape    Education Provided Other (comment);Residual limb care   see prosthetic care comments   Person(s) Educated Patient    Education Method Explanation;Verbal cues    Education Method Verbalized understanding;Verbal cues required;Needs further instruction    Donning Prosthesis Modified independent (device/increased time)                  PT Education  - 09/05/20 1246    Education Details increase activity distance - see pt instructions    Person(s) Educated Patient    Methods Explanation;Verbal cues    Comprehension Verbalized understanding;Verbal cues required;Need further instruction            PT Short Term Goals - 08/14/20 1031      PT SHORT TERM GOAL #1   Title Patient demonstrates proper donning & verbalizes proper cleaning  of prosthesis & socks.    Time 1    Period Months    Status New    Target Date 09/14/20      PT SHORT TERM GOAL #2   Title Patient tolerates wear of prosthesis 12 hrs total / day with skin or limb pain issues.    Time 1    Period Months    Status New    Target Date 09/14/20      PT SHORT TERM GOAL #3   Title Patient reaches 10" and picks up items from floor without UE support safely.    Time 1    Period Months    Status New    Target Date 09/14/20      PT SHORT TERM GOAL #4   Title Patient ambulates 300' with cane & prosthesis with supervision.    Time 1    Period Months    Status New    Target Date 09/14/20      PT SHORT TERM GOAL #5   Title Patient negotiates ramps & curbs with cane & prosthesis with minA.    Time 1    Period Months    Status New    Target Date 09/14/20             PT Long Term Goals - 08/14/20 1027      PT LONG TERM GOAL #1   Title Patient demonstrates & verbalizes understanding of prosthetic care to enable safe utilization of prosthesis.    Time 10    Period Weeks    Status New    Target Date 10/19/20      PT LONG TERM GOAL #2   Title Patient tolerates wear of prosthesis >90% of awake hours without skin or limb pain issues to enable function throughout his day.    Time 10    Period Weeks    Status New    Target Date 10/19/20      PT LONG TERM GOAL #3   Title [place] Balance  >/= 45/56 to indicate lower fall risk.    Time 10    Period Weeks    Status New    Target Date 10/19/20      PT LONG TERM GOAL #4   Title Patient ambulates 500' with cane or  less and prosthesis modified independent to enable community mobility.    Time 10    Period Weeks    Status New    Target Date 10/19/20      PT LONG TERM GOAL #5   Title Patient negotiates ramps, curbs & stairs with cane or less and prosthesis modified independent.    Time 10    Period Weeks    Status New    Target Date 10/19/20      Additional Long Term Goals   Additional Long Term Goals Yes      PT LONG TERM GOAL #6   Title Patient performs work simulation tasks including lifting & carrying 20# safely.    Time 10    Period Weeks    Status New    Target Date 10/19/20                 Plan - 09/05/20 1152    Clinical Impression Statement Patient is progressing well with prosthetic gait & balance.  He is on target to meet STGs next week.    Personal Factors and Comorbidities Comorbidity 3+;Fitness    Comorbidities rt TTA, CKD, neuropathy, COPD w/ DOE,  anemia, PAD s/p multiple revascularizations, HTN    Examination-Activity Limitations Carry;Lift;Locomotion Level;Reach Overhead;Squat;Stairs;Stand;Transfers    Examination-Participation Restrictions Community Activity;Occupation    Stability/Clinical Decision Making Evolving/Moderate complexity    Rehab Potential Good    PT Frequency 2x / week    PT Duration Other (comment)   10 weeks   PT Treatment/Interventions ADLs/Self Care Home Management;DME Instruction;Gait training;Stair training;Functional mobility training;Therapeutic activities;Therapeutic exercise;Balance training;Neuromuscular re-education;Patient/family education;Prosthetic Training;Manual techniques;Vestibular    PT Next Visit Plan review prosthetic care, prosthetic gait including negotiating ramps & curbs with cane stand alone tip & prosthesis for community activities and no device for household activities. therapeutic exercise & neuromuscular for strength, flexibility, balance & endurance.    Consulted and Agree with Plan of Care Patient           Patient  will benefit from skilled therapeutic intervention in order to improve the following deficits and impairments:  Abnormal gait,Cardiopulmonary status limiting activity,Decreased activity tolerance,Decreased balance,Decreased knowledge of precautions,Decreased mobility,Decreased strength,Increased edema,Postural dysfunction,Prosthetic Dependency,Pain  Visit Diagnosis: Muscle weakness (generalized)  Unsteadiness on feet  Abnormal posture  Other abnormalities of gait and mobility     Problem List Patient Active Problem List   Diagnosis Date Noted  . Phantom limb pain ([place])   . Essential hypertension   . AKI (acute kidney injury) ([place])   . Acute blood loss anemia   . Postoperative pain   . Below-knee amputation of right lower extremity ([place]) 05/17/2020  . Ischemia of right lower extremity 01/28/2019  . Iron deficiency anemia 11/23/2018  . Stage 3 chronic kidney disease ([place]) 10/21/2018  . Atherosclerosis of native arteries of extremity with rest pain ([place]) 10/13/2018  . PAD (peripheral artery disease) ([place]) 07/21/2018  . Ischemic leg 06/25/2018  . Claudication ([place]) 03/17/2018  . B12 deficiency 03/12/2018  . Vitamin B1 deficiency 03/12/2018  . Varicose veins of leg with pain, right 03/12/2018  . Enlarged thoracic aorta ([place]) 02/24/2018  . Alcoholism /alcohol abuse 02/24/2018  . Paresthesia 02/24/2018  . Ectatic aorta ([place]) 02/20/2018  . Emphysema lung ([place]) 02/20/2018  . Atherosclerosis of aorta ([place]) 02/20/2018  . Encounter for tobacco use cessation counseling   . Benign neoplasm of cecum   . Benign neoplasm of transverse colon   . Benign neoplasm of sigmoid colon   . Hypertension, benign 08/31/2015  . Tobacco use 08/31/2015    Jamey Reas, PT, DPT 09/05/2020, 12:51 PM  [facility] Physical Therapy [address] [place], [place], [address] Phone: [phone]   Fax:  [phone]  Name: Ryan [name] MRN: [id number] Date of Birth:  [birth year]

## 2020-09-05 NOTE — Patient Instructions (Signed)
Increasing your activity level is important. Short distances which is walking from one room to another. Work to increase frequency back to prior level. Can do without walking device except prosthesis.  Medium distances are entering & exiting your home or community with limited distances. Can do medium with cane.  Start with 4 medium walks which is one outing to one location and increase number of tolerated amounts. Long distance is your highest tolerance for you. Walk until you feel you must rest. Back or leg pain or general fatigue are indicators to maximum tolerance. Monitor by distance or time. Try to walk your BEST distance 1-2 times per day. You should see this increase over time. Use RW or shopping cart for long walk.

## 2020-09-07 ENCOUNTER — Ambulatory Visit (INDEPENDENT_AMBULATORY_CARE_PROVIDER_SITE_OTHER): Payer: 59 | Admitting: Physical Therapy

## 2020-09-07 ENCOUNTER — Encounter: Payer: Self-pay | Admitting: Physical Therapy

## 2020-09-07 DIAGNOSIS — M6281 Muscle weakness (generalized): Secondary | ICD-10-CM

## 2020-09-07 DIAGNOSIS — R293 Abnormal posture: Secondary | ICD-10-CM

## 2020-09-07 DIAGNOSIS — R2681 Unsteadiness on feet: Secondary | ICD-10-CM | POA: Diagnosis not present

## 2020-09-07 DIAGNOSIS — R2689 Other abnormalities of gait and mobility: Secondary | ICD-10-CM

## 2020-09-07 NOTE — Therapy (Signed)
[facility] [address] [place], [place], [address] Phone: [phone]   Fax:  [phone]  Physical Therapy Treatment  Patient Details  Name: Ryan [name] MRN: [id number] Date of Birth: 1955-07-02 Referring Provider (PT): Waynetta Sandy, MD   Encounter Date: 09/07/2020   PT End of Session - 09/07/20 1225    Visit Number 8    Number of Visits 20    Date for PT Re-Evaluation 10/19/20    Authorization Type [employer]    Authorization Time Period 20% coinsurance, $1500 OOP max not met    PT Start Time 1200    PT Stop Time 1230    PT Time Calculation (min) 30 min    Equipment Utilized During Treatment Gait belt    Activity Tolerance Patient tolerated treatment well    Behavior During Therapy [facility] for tasks assessed/performed           Past Medical History:  Diagnosis Date  . CKD (chronic kidney disease)   . History of blood transfusion   . Hyperlipidemia   . Hypertension   . Neuromuscular disorder (HCC)    numbness feet  . Peripheral vascular disease ([place])   . Shortness of breath dyspnea    occasional     Past Surgical History:  Procedure Laterality Date  . ABDOMINAL AORTOGRAM W/LOWER EXTREMITY N/A 09/13/2019   Procedure: ABDOMINAL AORTOGRAM W/LOWER EXTREMITY;  Surgeon: Waynetta Sandy, MD;  Location: [place] [facility];  Service: Cardiovascular;  Laterality: N/A;  . AMPUTATION Right 05/09/2020   Procedure: RIGHT BELOW KNEE AMPUTATION;  Surgeon: Waynetta Sandy, MD;  Location: [place];  Service: Vascular;  Laterality: Right;  w/ a block  . COLONOSCOPY WITH PROPOFOL N/A 02/15/2016   Procedure: COLONOSCOPY WITH PROPOFOL;  Surgeon: Lucilla Lame, MD;  Location: [place];  Service: Endoscopy;  Laterality: N/A;  . FEMORAL-POPLITEAL BYPASS GRAFT Right 09/14/2019   Procedure: BYPASS GRAFT FEMORAL-POPLITEAL ARTERY;  Surgeon: Waynetta Sandy, MD;  Location: [place];  Service: Vascular;   Laterality: Right;  . HERNIA REPAIR  1999   left inguinal  . INSERTION OF ILIAC STENT Right 09/14/2019   Procedure: Insertion Of Common and External Iliac Stent;  Surgeon: Waynetta Sandy, MD;  Location: [place];  Service: Vascular;  Laterality: Right;  . LOWER EXTREMITY ANGIOGRAPHY Right 05/07/2018   Procedure: LOWER EXTREMITY ANGIOGRAPHY;  Surgeon: Algernon Huxley, MD;  Location: [place] [facility];  Service: Cardiovascular;  Laterality: Right;  . LOWER EXTREMITY ANGIOGRAPHY Right 06/25/2018   Procedure: LOWER EXTREMITY ANGIOGRAPHY;  Surgeon: Algernon Huxley, MD;  Location: [place] [facility];  Service: Cardiovascular;  Laterality: Right;  . LOWER EXTREMITY ANGIOGRAPHY Right 07/01/2018   Procedure: LOWER EXTREMITY ANGIOGRAPHY;  Surgeon: Algernon Huxley, MD;  Location: [place] [facility];  Service: Cardiovascular;  Laterality: Right;  . LOWER EXTREMITY ANGIOGRAPHY Right 08/12/2018   Procedure: LOWER EXTREMITY ANGIOGRAPHY;  Surgeon: Algernon Huxley, MD;  Location: [place] [facility];  Service: Cardiovascular;  Laterality: Right;  . LOWER EXTREMITY ANGIOGRAPHY Right 09/03/2018   Procedure: LOWER EXTREMITY ANGIOGRAPHY;  Surgeon: Algernon Huxley, MD;  Location: [place] [facility];  Service: Cardiovascular;  Laterality: Right;  . LOWER EXTREMITY ANGIOGRAPHY Right 09/04/2018   Procedure: Lower Extremity Angiography;  Surgeon: Algernon Huxley, MD;  Location: [place] [facility];  Service: Cardiovascular;  Laterality: Right;  . LOWER EXTREMITY ANGIOGRAPHY Right 10/01/2018   Procedure: LOWER EXTREMITY ANGIOGRAPHY;  Surgeon: Algernon Huxley, MD;  Location: [place] [facility];  Service: Cardiovascular;  Laterality: Right;  . LOWER EXTREMITY ANGIOGRAPHY Right 10/01/2018   Procedure: Lower Extremity Angiography;  Surgeon: Algernon Huxley, MD;  Location: [place] [facility];  Service: Cardiovascular;  Laterality: Right;  . LOWER EXTREMITY ANGIOGRAPHY Right 10/15/2018   Procedure: LOWER EXTREMITY ANGIOGRAPHY;  Surgeon:  Algernon Huxley, MD;  Location: [place] [facility];  Service: Cardiovascular;  Laterality: Right;  . LOWER EXTREMITY ANGIOGRAPHY Left 10/16/2018   Procedure: Lower Extremity Angiography;  Surgeon: Algernon Huxley, MD;  Location: [place] [facility];  Service: Cardiovascular;  Laterality: Left;  . LOWER EXTREMITY ANGIOGRAPHY Left 01/20/2019   Procedure: LOWER EXTREMITY ANGIOGRAPHY;  Surgeon: Algernon Huxley, MD;  Location: [place] [facility];  Service: Cardiovascular;  Laterality: Left;  . LOWER EXTREMITY ANGIOGRAPHY Right 01/21/2019   Procedure: Lower Extremity Angiography;  Surgeon: Algernon Huxley, MD;  Location: [place] [facility];  Service: Cardiovascular;  Laterality: Right;  . LOWER EXTREMITY ANGIOGRAPHY Right 01/28/2019   Procedure: LOWER EXTREMITY ANGIOGRAPHY;  Surgeon: Algernon Huxley, MD;  Location: [place] [facility];  Service: Cardiovascular;  Laterality: Right;  . LOWER EXTREMITY ANGIOGRAPHY Right 01/29/2019   Procedure: Lower Extremity Angiography;  Surgeon: Algernon Huxley, MD;  Location: [place] [facility];  Service: Cardiovascular;  Laterality: Right;  . LOWER EXTREMITY ANGIOGRAPHY Right 04/04/2020   Procedure: LOWER EXTREMITY ANGIOGRAPHY;  Surgeon: Waynetta Sandy, MD;  Location: [place] [facility];  Service: Cardiovascular;  Laterality: Right;  . LOWER EXTREMITY INTERVENTION Right 07/02/2018   Procedure: LOWER EXTREMITY INTERVENTION;  Surgeon: Algernon Huxley, MD;  Location: [place] [facility];  Service: Cardiovascular;  Laterality: Right;  . PERIPHERAL VASCULAR BALLOON ANGIOPLASTY Left 04/04/2020   Procedure: PERIPHERAL VASCULAR BALLOON ANGIOPLASTY;  Surgeon: Waynetta Sandy, MD;  Location: [place] [facility];  Service: Cardiovascular;  Laterality: Left;  external iliac  . POLYPECTOMY N/A 02/15/2016   Procedure: POLYPECTOMY;  Surgeon: Lucilla Lame, MD;  Location: [place];  Service: Endoscopy;  Laterality: N/A;    There were no vitals filed for this visit.    Subjective Assessment - 09/07/20 1200    Subjective He had difficulty getting prosthesis on limb again. He had to go see prosthetist prior to PT who thought his limb was swollen. Prosthetist removed flexible inner liner that he just placed inside socket a week ago.  This is why he is late to PT today.    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 No/denies    Pain Onset More than a month ago                             [facility] Adult PT Treatment/Exercise - 09/07/20 1200      Transfers   Transfers Sit to Stand;Stand to Sit    Sit to Stand 5: Supervision;With upper extremity assist;With armrests;From chair/3-in-1    Sit to Stand Details Visual cues/gestures for sequencing;Verbal cues for technique    Stand to Sit 5: Supervision;With upper extremity assist;With armrests;To chair/3-in-1    Stand to Sit Details (indicate cue type and reason) Visual cues/gestures for sequencing;Verbal cues for technique  Ambulation/Gait   Ambulation/Gait Yes    Ambulation/Gait Assistance 5: Supervision    Ambulation Distance (Feet) 300 Feet   300' cane & 33' no device except prosthesis   Assistive device Prosthesis;Straight cane;None   straight cane stand alone tip & no device inside //bars     Exercises   Exercises Knee/Hip      Knee/Hip Exercises: Aerobic   Nustep seat 10 level 7 with BLEs & BUEs for 8 min as warm up      Prosthetics   Prosthetic Care Comments  PT demo & verbal cues on donning in mornings - initially with 0-ply socks, can stand to weight prosthesis to engage pin (don't lift prosthetic foot >1" off floor until pin engages),  only need at least 2 clicks to enable safe suspension to walk. Pt verbalized & return  demo understanding.  PT demo & verbal cues on doffing with explaination how release button actually works and need to hold button until pin clears hole or lock (if not it will just grab pin again), Can use boot jack to assist doffing.    Current prosthetic wear tolerance (days/week)  daily    Current prosthetic wear tolerance (#hours/day)  most of awake hours    Current prosthetic weight-bearing tolerance (hours/day)  pt tolerated 10 minutes for standing & gait without c/o limb pain or discomfort.    Edema pitting with 5 sec capillary refill    Residual limb condition  no open areas, scar invaginated slightly, sweaty skin, normal color & temperature, cylinderical shape    Education Provided Other (comment);Proper Donning;Proper Doffing   see prosthetic care comments   Person(s) Educated Patient    Education Method Explanation;Demonstration;Tactile cues;Verbal cues    Education Method Verbalized understanding;Returned demonstration;Tactile cues required;Verbal cues required;Needs further instruction    Donning Prosthesis Supervision    Doffing Prosthesis Supervision                    PT Short Term Goals - 08/14/20 1031      PT SHORT TERM GOAL #1   Title Patient demonstrates proper donning & verbalizes proper cleaning of prosthesis & socks.    Time 1    Period Months    Status New    Target Date 09/14/20      PT SHORT TERM GOAL #2   Title Patient tolerates wear of prosthesis 12 hrs total / day with skin or limb pain issues.    Time 1    Period Months    Status New    Target Date 09/14/20      PT SHORT TERM GOAL #3   Title Patient reaches 10" and picks up items from floor without UE support safely.    Time 1    Period Months    Status New    Target Date 09/14/20      PT SHORT TERM GOAL #4   Title Patient ambulates 300' with cane & prosthesis with supervision.    Time 1    Period Months    Status New    Target Date 09/14/20      PT SHORT TERM GOAL #5   Title  Patient negotiates ramps & curbs with cane & prosthesis with minA.    Time 1    Period Months    Status New    Target Date 09/14/20             PT Long Term Goals - 08/14/20 1027  PT LONG TERM GOAL #1   Title Patient demonstrates & verbalizes understanding of prosthetic care to enable safe utilization of prosthesis.    Time 10    Period Weeks    Status New    Target Date 10/19/20      PT LONG TERM GOAL #2   Title Patient tolerates wear of prosthesis >90% of awake hours without skin or limb pain issues to enable function throughout his day.    Time 10    Period Weeks    Status New    Target Date 10/19/20      PT LONG TERM GOAL #3   Title [place] Balance  >/= 45/56 to indicate lower fall risk.    Time 10    Period Weeks    Status New    Target Date 10/19/20      PT LONG TERM GOAL #4   Title Patient ambulates 500' with cane or less and prosthesis modified independent to enable community mobility.    Time 10    Period Weeks    Status New    Target Date 10/19/20      PT LONG TERM GOAL #5   Title Patient negotiates ramps, curbs & stairs with cane or less and prosthesis modified independent.    Time 10    Period Weeks    Status New    Target Date 10/19/20      Additional Long Term Goals   Additional Long Term Goals Yes      PT LONG TERM GOAL #6   Title Patient performs work simulation tasks including lifting & carrying 20# safely.    Time 10    Period Weeks    Status New    Target Date 10/19/20                 Plan - 09/07/20 1225    Clinical Impression Statement PT spent most of session educating patient on donning & doffing options as he struggles with donning in morning. He appears to have better understanding.    Personal Factors and Comorbidities Comorbidity 3+;Fitness    Comorbidities rt TTA, CKD, neuropathy, COPD w/ DOE, anemia, PAD s/p multiple revascularizations, HTN    Examination-Activity Limitations Carry;Lift;Locomotion Level;Reach  Overhead;Squat;Stairs;Stand;Transfers    Examination-Participation Restrictions Community Activity;Occupation    Stability/Clinical Decision Making Evolving/Moderate complexity    Rehab Potential Good    PT Frequency 2x / week    PT Duration Other (comment)   10 weeks   PT Treatment/Interventions ADLs/Self Care Home Management;DME Instruction;Gait training;Stair training;Functional mobility training;Therapeutic activities;Therapeutic exercise;Balance training;Neuromuscular re-education;Patient/family education;Prosthetic Training;Manual techniques;Vestibular    PT Next Visit Plan review prosthetic care, prosthetic gait including negotiating ramps & curbs with cane stand alone tip & prosthesis for community activities and no device for household activities. therapeutic exercise & neuromuscular for strength, flexibility, balance & endurance.    Consulted and Agree with Plan of Care Patient           Patient will benefit from skilled therapeutic intervention in order to improve the following deficits and impairments:  Abnormal gait,Cardiopulmonary status limiting activity,Decreased activity tolerance,Decreased balance,Decreased knowledge of precautions,Decreased mobility,Decreased strength,Increased edema,Postural dysfunction,Prosthetic Dependency,Pain  Visit Diagnosis: Muscle weakness (generalized)  Unsteadiness on feet  Abnormal posture  Other abnormalities of gait and mobility     Problem List Patient Active Problem List   Diagnosis Date Noted  . Phantom limb pain ([place])   . Essential hypertension   . AKI (acute kidney injury) ([place])   .  Acute blood loss anemia   . Postoperative pain   . Below-knee amputation of right lower extremity ([place]) 05/17/2020  . Ischemia of right lower extremity 01/28/2019  . Iron deficiency anemia 11/23/2018  . Stage 3 chronic kidney disease ([place]) 10/21/2018  . Atherosclerosis of native arteries of extremity with rest pain ([place]) 10/13/2018  . PAD  (peripheral artery disease) ([place]) 07/21/2018  . Ischemic leg 06/25/2018  . Claudication ([place]) 03/17/2018  . B12 deficiency 03/12/2018  . Vitamin B1 deficiency 03/12/2018  . Varicose veins of leg with pain, right 03/12/2018  . Enlarged thoracic aorta ([place]) 02/24/2018  . Alcoholism /alcohol abuse 02/24/2018  . Paresthesia 02/24/2018  . Ectatic aorta ([place]) 02/20/2018  . Emphysema lung ([place]) 02/20/2018  . Atherosclerosis of aorta ([place]) 02/20/2018  . Encounter for tobacco use cessation counseling   . Benign neoplasm of cecum   . Benign neoplasm of transverse colon   . Benign neoplasm of sigmoid colon   . Hypertension, benign 08/31/2015  . Tobacco use 08/31/2015    Jamey Reas PT, DPT 09/07/2020, 12:51 PM  [facility] [address] [place], [place], [address] Phone: [phone]   Fax:  [phone]  Name: Ryan [name] MRN: [id number] Date of Birth: Mar 30, 1956

## 2020-09-12 ENCOUNTER — Ambulatory Visit (INDEPENDENT_AMBULATORY_CARE_PROVIDER_SITE_OTHER): Payer: 59 | Admitting: Physical Therapy

## 2020-09-12 ENCOUNTER — Other Ambulatory Visit: Payer: Self-pay

## 2020-09-12 ENCOUNTER — Encounter: Payer: Self-pay | Admitting: Physical Therapy

## 2020-09-12 DIAGNOSIS — R2689 Other abnormalities of gait and mobility: Secondary | ICD-10-CM | POA: Diagnosis not present

## 2020-09-12 DIAGNOSIS — R2681 Unsteadiness on feet: Secondary | ICD-10-CM

## 2020-09-12 DIAGNOSIS — M6281 Muscle weakness (generalized): Secondary | ICD-10-CM | POA: Diagnosis not present

## 2020-09-12 DIAGNOSIS — R293 Abnormal posture: Secondary | ICD-10-CM

## 2020-09-12 NOTE — Therapy (Signed)
[facility] [address] [place], [place], [address] Phone: [phone]   Fax:  [phone]  Physical Therapy Treatment  Patient Details  Name: Ryan [name] MRN: [id number] Date of Birth: 1956-06-17 Referring Provider (PT): Waynetta Sandy, MD   Encounter Date: 09/12/2020   PT End of Session - 09/12/20 1152    Visit Number 9    Number of Visits 20    Date for PT Re-Evaluation 10/19/20    Authorization Type [employer]    Authorization Time Period 20% coinsurance, $1500 OOP max not met    PT Start Time 1150    PT Stop Time 1233    PT Time Calculation (min) 43 min    Equipment Utilized During Treatment Gait belt    Activity Tolerance Patient tolerated treatment well    Behavior During Therapy WFL for tasks assessed/performed           Past Medical History:  Diagnosis Date  . CKD (chronic kidney disease)   . History of blood transfusion   . Hyperlipidemia   . Hypertension   . Neuromuscular disorder (HCC)    numbness feet  . Peripheral vascular disease ([place])   . Shortness of breath dyspnea    occasional     Past Surgical History:  Procedure Laterality Date  . ABDOMINAL AORTOGRAM W/LOWER EXTREMITY N/A 09/13/2019   Procedure: ABDOMINAL AORTOGRAM W/LOWER EXTREMITY;  Surgeon: Waynetta Sandy, MD;  Location: [place] [facility];  Service: Cardiovascular;  Laterality: N/A;  . AMPUTATION Right 05/09/2020   Procedure: RIGHT BELOW KNEE AMPUTATION;  Surgeon: Waynetta Sandy, MD;  Location: [place];  Service: Vascular;  Laterality: Right;  w/ a block  . COLONOSCOPY WITH PROPOFOL N/A 02/15/2016   Procedure: COLONOSCOPY WITH PROPOFOL;  Surgeon: Lucilla Lame, MD;  Location: [place];  Service: Endoscopy;  Laterality: N/A;  . FEMORAL-POPLITEAL BYPASS GRAFT Right 09/14/2019   Procedure: BYPASS GRAFT FEMORAL-POPLITEAL ARTERY;  Surgeon: Waynetta Sandy, MD;  Location: [place];  Service: Vascular;   Laterality: Right;  . HERNIA REPAIR  1999   left inguinal  . INSERTION OF ILIAC STENT Right 09/14/2019   Procedure: Insertion Of Common and External Iliac Stent;  Surgeon: Waynetta Sandy, MD;  Location: [place];  Service: Vascular;  Laterality: Right;  . LOWER EXTREMITY ANGIOGRAPHY Right 05/07/2018   Procedure: LOWER EXTREMITY ANGIOGRAPHY;  Surgeon: Algernon Huxley, MD;  Location: [place] [facility];  Service: Cardiovascular;  Laterality: Right;  . LOWER EXTREMITY ANGIOGRAPHY Right 06/25/2018   Procedure: LOWER EXTREMITY ANGIOGRAPHY;  Surgeon: Algernon Huxley, MD;  Location: [place] [facility];  Service: Cardiovascular;  Laterality: Right;  . LOWER EXTREMITY ANGIOGRAPHY Right 07/01/2018   Procedure: LOWER EXTREMITY ANGIOGRAPHY;  Surgeon: Algernon Huxley, MD;  Location: [place] [facility];  Service: Cardiovascular;  Laterality: Right;  . LOWER EXTREMITY ANGIOGRAPHY Right 08/12/2018   Procedure: LOWER EXTREMITY ANGIOGRAPHY;  Surgeon: Algernon Huxley, MD;  Location: [place] [facility];  Service: Cardiovascular;  Laterality: Right;  . LOWER EXTREMITY ANGIOGRAPHY Right 09/03/2018   Procedure: LOWER EXTREMITY ANGIOGRAPHY;  Surgeon: Algernon Huxley, MD;  Location: [place] [facility];  Service: Cardiovascular;  Laterality: Right;  . LOWER EXTREMITY ANGIOGRAPHY Right 09/04/2018   Procedure: Lower Extremity Angiography;  Surgeon: Algernon Huxley, MD;  Location: [place] [facility];  Service: Cardiovascular;  Laterality: Right;  . LOWER EXTREMITY ANGIOGRAPHY Right 10/01/2018   Procedure: LOWER EXTREMITY ANGIOGRAPHY;  Surgeon: Algernon Huxley, MD;  Location: [place] [facility];  Service: Cardiovascular;  Laterality: Right;  . LOWER EXTREMITY ANGIOGRAPHY Right 10/01/2018   Procedure: Lower Extremity Angiography;  Surgeon: Algernon Huxley, MD;  Location: [place] [facility];  Service: Cardiovascular;  Laterality: Right;  . LOWER EXTREMITY ANGIOGRAPHY Right 10/15/2018   Procedure: LOWER EXTREMITY ANGIOGRAPHY;  Surgeon:  Algernon Huxley, MD;  Location: [place] [facility];  Service: Cardiovascular;  Laterality: Right;  . LOWER EXTREMITY ANGIOGRAPHY Left 10/16/2018   Procedure: Lower Extremity Angiography;  Surgeon: Algernon Huxley, MD;  Location: [place] [facility];  Service: Cardiovascular;  Laterality: Left;  . LOWER EXTREMITY ANGIOGRAPHY Left 01/20/2019   Procedure: LOWER EXTREMITY ANGIOGRAPHY;  Surgeon: Algernon Huxley, MD;  Location: [place] [facility];  Service: Cardiovascular;  Laterality: Left;  . LOWER EXTREMITY ANGIOGRAPHY Right 01/21/2019   Procedure: Lower Extremity Angiography;  Surgeon: Algernon Huxley, MD;  Location: [place] [facility];  Service: Cardiovascular;  Laterality: Right;  . LOWER EXTREMITY ANGIOGRAPHY Right 01/28/2019   Procedure: LOWER EXTREMITY ANGIOGRAPHY;  Surgeon: Algernon Huxley, MD;  Location: [place] [facility];  Service: Cardiovascular;  Laterality: Right;  . LOWER EXTREMITY ANGIOGRAPHY Right 01/29/2019   Procedure: Lower Extremity Angiography;  Surgeon: Algernon Huxley, MD;  Location: [place] [facility];  Service: Cardiovascular;  Laterality: Right;  . LOWER EXTREMITY ANGIOGRAPHY Right 04/04/2020   Procedure: LOWER EXTREMITY ANGIOGRAPHY;  Surgeon: Waynetta Sandy, MD;  Location: [place] [facility];  Service: Cardiovascular;  Laterality: Right;  . LOWER EXTREMITY INTERVENTION Right 07/02/2018   Procedure: LOWER EXTREMITY INTERVENTION;  Surgeon: Algernon Huxley, MD;  Location: [place] [facility];  Service: Cardiovascular;  Laterality: Right;  . PERIPHERAL VASCULAR BALLOON ANGIOPLASTY Left 04/04/2020   Procedure: PERIPHERAL VASCULAR BALLOON ANGIOPLASTY;  Surgeon: Waynetta Sandy, MD;  Location: [place] [facility];  Service: Cardiovascular;  Laterality: Left;  external iliac  . POLYPECTOMY N/A 02/15/2016   Procedure: POLYPECTOMY;  Surgeon: Lucilla Lame, MD;  Location: [place];  Service: Endoscopy;  Laterality: N/A;    There were no vitals filed for this visit.    Subjective Assessment - 09/12/20 1150    Subjective No issues with prosthesis. The bypass graft is sticking out more.    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 No/denies    Pain Onset More than a month ago              [facility] PT Assessment - 09/12/20 1150      Assessment   Medical Diagnosis RIght Transtibial Amputaion    Referring Provider (PT) Waynetta Sandy, MD    Onset Date/Surgical Date 08/07/20      Ambulation/Gait   Gait velocity 2.07 ft/sec   with cane 2.07 ft/sec, on 08/14/20 was 1.60 ft/sec with RW     Standardized Balance Assessment   Standardized Balance Assessment Dynamic Gait Index;Timed Up and Go Test;Berg Balance Test      Berg Balance Test   Sit to Stand Able to stand without using hands and stabilize independently    Standing Unsupported Able to stand safely 2 minutes    Sitting with Back Unsupported but Feet Supported on Floor or Stool Able to sit  safely and securely 2 minutes    Stand to Sit Sits safely with minimal use of hands    Transfers Able to transfer safely, minor use of hands    Standing Unsupported with Eyes Closed Able to stand 10 seconds with supervision    Standing Unsupported with Feet Together Able to place feet together independently and stand for 1 minute with supervision    From Standing, Reach Forward with Outstretched Arm Can reach forward >12 cm safely (5")    From Standing Position, Pick up Object from Floor Able to pick up shoe, needs supervision    From Standing Position, Turn to Look Behind Over each Shoulder Looks behind one side only/other side shows less weight shift    Turn 360 Degrees Able to turn 360 degrees safely but slowly    Standing Unsupported,  Alternately Place Feet on Step/Stool Able to complete >2 steps/needs minimal assist    Standing Unsupported, One Foot in Front Able to take small step independently and hold 30 seconds    Standing on One Leg Able to lift leg independently and hold equal to or more than 3 seconds    Total Score 42    Berg comment: pn 08/14/20 was 27/56      Dynamic Gait Index   Level Surface Moderate Impairment    Change in Gait Speed Moderate Impairment    Gait with Horizontal Head Turns Moderate Impairment    Gait with Vertical Head Turns Moderate Impairment    Gait and Pivot Turn Moderate Impairment    Step Over Obstacle Moderate Impairment    Step Around Obstacles Moderate Impairment    Steps Moderate Impairment    Total Score 8    DGI comment: with cane      Timed Up and Go Test   Normal TUG (seconds) 13.04    Cognitive TUG (seconds) 18.33   naming items needed to change oil                        [facility] Adult PT Treatment/Exercise - 09/12/20 1150      Bed Mobility   Bed Mobility Rolling Right      Transfers   Transfers Sit to Stand;Stand to Sit;Floor to Transfer    Sit to Stand 5: Supervision;With upper extremity assist;With armrests;From chair/3-in-1    Sit to Stand Details Visual cues/gestures for sequencing;Verbal cues for technique    Stand to Sit 5: Supervision;With upper extremity assist;With armrests;To chair/3-in-1    Stand to Sit Details (indicate cue type and reason) Visual cues/gestures for sequencing;Verbal cues for technique    Floor to Transfer 5: Supervision;With upper extremity assist   pushing on chair bottom, via Half kneeling   Floor to Transfer Details (indicate cue type and reason) PT demo & verbal cues on technique with TTA Prosthesis.      Ambulation/Gait   Ambulation/Gait Yes    Ambulation/Gait Assistance 5: Supervision    Ambulation Distance (Feet) 300 Feet   300' cane & 61' no device except prosthesis   Assistive device Prosthesis;Straight cane;None    straight cane stand alone tip & no device inside //bars     High Level Balance   High Level Balance Activities Negotiating over obstacles    High Level Balance Comments demo & vrebal cues on technique      Exercises   Exercises Knee/Hip      Knee/Hip Exercises: Aerobic   Nustep --      Prosthetics  Prosthetic Care Comments  vascular surgery graft site is becoming more prominent due to shrinkage. His Vivewear shrinker is XXL and current limb size indicates L. patient to discuss with Dr. [name] at appt on 4/1.    Current prosthetic wear tolerance (days/week)  daily    Current prosthetic wear tolerance (#hours/day)  most of awake hours    Current prosthetic weight-bearing tolerance (hours/day)  pt tolerated 10 minutes for standing & gait without c/o limb pain or discomfort.    Edema pitting with 5 sec capillary refill    Residual limb condition  no open areas, scar invaginated slightly, sweaty skin, normal color & temperature, cylinderical shape    Education Provided Other (comment);Proper Donning;Proper Doffing   see prosthetic care comments   Person(s) Educated Patient    Education Method Explanation;Verbal cues    Education Method Verbalized understanding;Verbal cues required;Needs further instruction                    PT Short Term Goals - 09/12/20 1250      PT SHORT TERM GOAL #1   Title Patient demonstrates proper donning & verbalizes proper cleaning of prosthesis & socks.    Time 1    Period Months    Status Achieved    Target Date 09/14/20      PT SHORT TERM GOAL #2   Title Patient tolerates wear of prosthesis 12 hrs total / day with skin or limb pain issues.    Time 1    Period Months    Status Achieved    Target Date 09/14/20      PT SHORT TERM GOAL #3   Title Patient reaches 10" and picks up items from floor without UE support safely.    Time 1    Period Months    Status Achieved    Target Date 09/14/20      PT SHORT TERM GOAL #4   Title Patient  ambulates 300' with cane & prosthesis with supervision.    Time 1    Period Months    Status Achieved    Target Date 09/14/20      PT SHORT TERM GOAL #5   Title Patient negotiates ramps & curbs with cane & prosthesis with minA.    Time 1    Period Months    Status Achieved    Target Date 09/14/20             PT Long Term Goals - 09/12/20 1250      PT LONG TERM GOAL #1   Title Patient demonstrates & verbalizes understanding of prosthetic care to enable safe utilization of prosthesis.    Time 10    Period Weeks    Status On-going    Target Date 10/19/20      PT LONG TERM GOAL #2   Title Patient tolerates wear of prosthesis >90% of awake hours without skin or limb pain issues to enable function throughout his day.    Time 10    Period Weeks    Status On-going    Target Date 10/19/20      PT LONG TERM GOAL #3   Title Berg Balance  >/= 45/56 to indicate lower fall risk.    Time 10    Period Weeks    Status On-going    Target Date 10/19/20      PT LONG TERM GOAL #4   Title Patient ambulates 500' with cane or less and prosthesis modified independent to  enable community mobility.    Time 10    Period Weeks    Status On-going    Target Date 10/19/20      PT LONG TERM GOAL #5   Title Patient negotiates ramps, curbs & stairs with cane or less and prosthesis modified independent.    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    Time 10    Period Weeks    Status On-going    Target Date 10/19/20                 Plan - 09/12/20 1152    Clinical Impression Statement Patient's Berg Balance has improved significantly but still at risk of falls with <45/56.  Timed Up & Go with cane was within safe range <13.5 seconds but cognitive was >16.5 sec indicating fall risk when distracted.  Patient met all STGs.    Personal Factors and  Comorbidities Comorbidity 3+;Fitness    Comorbidities rt TTA, CKD, neuropathy, COPD w/ DOE, anemia, PAD s/p multiple revascularizations, HTN    Examination-Activity Limitations Carry;Lift;Locomotion Level;Reach Overhead;Squat;Stairs;Stand;Transfers    Examination-Participation Restrictions Community Activity;Occupation    Stability/Clinical Decision Making Evolving/Moderate complexity    Rehab Potential Good    PT Frequency 2x / week    PT Duration Other (comment)   10 weeks   PT Treatment/Interventions ADLs/Self Care Home Management;DME Instruction;Gait training;Stair training;Functional mobility training;Therapeutic activities;Therapeutic exercise;Balance training;Neuromuscular re-education;Patient/family education;Prosthetic Training;Manual techniques;Vestibular    PT Next Visit Plan review prosthetic care, prosthetic gait including negotiating ramps & curbs with cane stand alone tip & prosthesis for community activities and no device for household activities. therapeutic exercise & neuromuscular for strength, flexibility, balance & endurance.    Consulted and Agree with Plan of Care Patient           Patient will benefit from skilled therapeutic intervention in order to improve the following deficits and impairments:  Abnormal gait,Cardiopulmonary status limiting activity,Decreased activity tolerance,Decreased balance,Decreased knowledge of precautions,Decreased mobility,Decreased strength,Increased edema,Postural dysfunction,Prosthetic Dependency,Pain  Visit Diagnosis: Muscle weakness (generalized)  Unsteadiness on feet  Abnormal posture  Other abnormalities of gait and mobility     Problem List Patient Active Problem List   Diagnosis Date Noted  . Phantom limb pain ([place])   . Essential hypertension   . AKI (acute kidney injury) ([place])   . Acute blood loss anemia   . Postoperative pain   . Below-knee amputation of right lower extremity ([place]) 05/17/2020  . Ischemia of right  lower extremity 01/28/2019  . Iron deficiency anemia 11/23/2018  . Stage 3 chronic kidney disease ([place]) 10/21/2018  . Atherosclerosis of native arteries of extremity with rest pain ([name]) 10/13/2018  . PAD (peripheral artery disease) ([place]) 07/21/2018  . Ischemic leg 06/25/2018  . Claudication ([place]) 03/17/2018  . B12 deficiency 03/12/2018  . Vitamin B1 deficiency 03/12/2018  . Varicose veins of leg with pain, right 03/12/2018  . Enlarged thoracic aorta ([place]) 02/24/2018  . Alcoholism /alcohol abuse 02/24/2018  . Paresthesia 02/24/2018  . Ectatic aorta ([place]) 02/20/2018  . Emphysema lung ([place]) 02/20/2018  . Atherosclerosis of aorta ([place]) 02/20/2018  . Encounter for tobacco use cessation counseling   . Benign neoplasm of cecum   . Benign neoplasm of transverse colon   . Benign neoplasm of sigmoid colon   .  Hypertension, benign 08/31/2015  . Tobacco use 08/31/2015    Jamey Reas, PT, DPT 09/12/2020, 12:58 PM  [facility] Therapy [address] [place], [place], [address] Phone: [phone]   Fax:  [phone]  Name: Ryan [name] MRN: [id number] Date of Birth: June 24, 1956

## 2020-09-14 ENCOUNTER — Other Ambulatory Visit: Payer: Self-pay

## 2020-09-14 ENCOUNTER — Encounter: Payer: Self-pay | Admitting: Physical Therapy

## 2020-09-14 ENCOUNTER — Ambulatory Visit (INDEPENDENT_AMBULATORY_CARE_PROVIDER_SITE_OTHER): Payer: 59 | Admitting: Physical Therapy

## 2020-09-14 DIAGNOSIS — R293 Abnormal posture: Secondary | ICD-10-CM | POA: Diagnosis not present

## 2020-09-14 DIAGNOSIS — M6281 Muscle weakness (generalized): Secondary | ICD-10-CM | POA: Diagnosis not present

## 2020-09-14 DIAGNOSIS — R2689 Other abnormalities of gait and mobility: Secondary | ICD-10-CM | POA: Diagnosis not present

## 2020-09-14 DIAGNOSIS — R2681 Unsteadiness on feet: Secondary | ICD-10-CM

## 2020-09-14 NOTE — Therapy (Signed)
[facility] [address] [place], [place], [address] Phone: [phone]   Fax:  [phone]  Physical Therapy Treatment  Patient Details  Name: Ryan [name] MRN: [id number] Date of Birth: 01/12/1956 Referring Provider (PT): Waynetta Sandy, MD   Encounter Date: 09/14/2020   PT End of Session - 09/14/20 0937    Visit Number 10    Number of Visits 20    Date for PT Re-Evaluation 10/19/20    Authorization Type [employer]    Authorization Time Period 20% coinsurance, $1500 OOP max not met    PT Start Time 0930    PT Stop Time 1013    PT Time Calculation (min) 43 min    Equipment Utilized During Treatment Gait belt    Activity Tolerance Patient tolerated treatment well    Behavior During Therapy [facility] for tasks assessed/performed           Past Medical History:  Diagnosis Date  . CKD (chronic kidney disease)   . History of blood transfusion   . Hyperlipidemia   . Hypertension   . Neuromuscular disorder (HCC)    numbness feet  . Peripheral vascular disease ([place])   . Shortness of breath dyspnea    occasional     Past Surgical History:  Procedure Laterality Date  . ABDOMINAL AORTOGRAM W/LOWER EXTREMITY N/A 09/13/2019   Procedure: ABDOMINAL AORTOGRAM W/LOWER EXTREMITY;  Surgeon: Waynetta Sandy, MD;  Location: [place] [facility];  Service: Cardiovascular;  Laterality: N/A;  . AMPUTATION Right 05/09/2020   Procedure: RIGHT BELOW KNEE AMPUTATION;  Surgeon: Waynetta Sandy, MD;  Location: [place];  Service: Vascular;  Laterality: Right;  w/ a block  . COLONOSCOPY WITH PROPOFOL N/A 02/15/2016   Procedure: COLONOSCOPY WITH PROPOFOL;  Surgeon: Lucilla Lame, MD;  Location: [place];  Service: Endoscopy;  Laterality: N/A;  . FEMORAL-POPLITEAL BYPASS GRAFT Right 09/14/2019   Procedure: BYPASS GRAFT FEMORAL-POPLITEAL ARTERY;  Surgeon: Waynetta Sandy, MD;  Location: [place];  Service: Vascular;   Laterality: Right;  . HERNIA REPAIR  1999   left inguinal  . INSERTION OF ILIAC STENT Right 09/14/2019   Procedure: Insertion Of Common and External Iliac Stent;  Surgeon: Waynetta Sandy, MD;  Location: [place];  Service: Vascular;  Laterality: Right;  . LOWER EXTREMITY ANGIOGRAPHY Right 05/07/2018   Procedure: LOWER EXTREMITY ANGIOGRAPHY;  Surgeon: Algernon Huxley, MD;  Location: [place] [facility];  Service: Cardiovascular;  Laterality: Right;  . LOWER EXTREMITY ANGIOGRAPHY Right 06/25/2018   Procedure: LOWER EXTREMITY ANGIOGRAPHY;  Surgeon: Algernon Huxley, MD;  Location: [place] [facility];  Service: Cardiovascular;  Laterality: Right;  . LOWER EXTREMITY ANGIOGRAPHY Right 07/01/2018   Procedure: LOWER EXTREMITY ANGIOGRAPHY;  Surgeon: Algernon Huxley, MD;  Location: [place] [facility];  Service: Cardiovascular;  Laterality: Right;  . LOWER EXTREMITY ANGIOGRAPHY Right 08/12/2018   Procedure: LOWER EXTREMITY ANGIOGRAPHY;  Surgeon: Algernon Huxley, MD;  Location: [place] [facility];  Service: Cardiovascular;  Laterality: Right;  . LOWER EXTREMITY ANGIOGRAPHY Right 09/03/2018   Procedure: LOWER EXTREMITY ANGIOGRAPHY;  Surgeon: Algernon Huxley, MD;  Location: [place] [facility];  Service: Cardiovascular;  Laterality: Right;  . LOWER EXTREMITY ANGIOGRAPHY Right 09/04/2018   Procedure: Lower Extremity Angiography;  Surgeon: Algernon Huxley, MD;  Location: [place] [facility];  Service: Cardiovascular;  Laterality: Right;  . LOWER EXTREMITY ANGIOGRAPHY Right 10/01/2018   Procedure: LOWER EXTREMITY ANGIOGRAPHY;  Surgeon: Algernon Huxley, MD;  Location: [place] [facility];  Service: Cardiovascular;  Laterality: Right;  . LOWER EXTREMITY ANGIOGRAPHY Right 10/01/2018   Procedure: Lower Extremity Angiography;  Surgeon: Algernon Huxley, MD;  Location: [place] [facility];  Service: Cardiovascular;  Laterality: Right;  . LOWER EXTREMITY ANGIOGRAPHY Right 10/15/2018   Procedure: LOWER EXTREMITY ANGIOGRAPHY;  Surgeon:  Algernon Huxley, MD;  Location: [place] [facility];  Service: Cardiovascular;  Laterality: Right;  . LOWER EXTREMITY ANGIOGRAPHY Left 10/16/2018   Procedure: Lower Extremity Angiography;  Surgeon: Algernon Huxley, MD;  Location: [place] [facility];  Service: Cardiovascular;  Laterality: Left;  . LOWER EXTREMITY ANGIOGRAPHY Left 01/20/2019   Procedure: LOWER EXTREMITY ANGIOGRAPHY;  Surgeon: Algernon Huxley, MD;  Location: [place] [facility];  Service: Cardiovascular;  Laterality: Left;  . LOWER EXTREMITY ANGIOGRAPHY Right 01/21/2019   Procedure: Lower Extremity Angiography;  Surgeon: Algernon Huxley, MD;  Location: [place] [facility];  Service: Cardiovascular;  Laterality: Right;  . LOWER EXTREMITY ANGIOGRAPHY Right 01/28/2019   Procedure: LOWER EXTREMITY ANGIOGRAPHY;  Surgeon: Algernon Huxley, MD;  Location: [place] [facility];  Service: Cardiovascular;  Laterality: Right;  . LOWER EXTREMITY ANGIOGRAPHY Right 01/29/2019   Procedure: Lower Extremity Angiography;  Surgeon: Algernon Huxley, MD;  Location: [place] [facility];  Service: Cardiovascular;  Laterality: Right;  . LOWER EXTREMITY ANGIOGRAPHY Right 04/04/2020   Procedure: LOWER EXTREMITY ANGIOGRAPHY;  Surgeon: Waynetta Sandy, MD;  Location: [place] [facility];  Service: Cardiovascular;  Laterality: Right;  . LOWER EXTREMITY INTERVENTION Right 07/02/2018   Procedure: LOWER EXTREMITY INTERVENTION;  Surgeon: Algernon Huxley, MD;  Location: [place] [facility];  Service: Cardiovascular;  Laterality: Right;  . PERIPHERAL VASCULAR BALLOON ANGIOPLASTY Left 04/04/2020   Procedure: PERIPHERAL VASCULAR BALLOON ANGIOPLASTY;  Surgeon: Waynetta Sandy, MD;  Location: [place] [facility];  Service: Cardiovascular;  Laterality: Left;  external iliac  . POLYPECTOMY N/A 02/15/2016   Procedure: POLYPECTOMY;  Surgeon: Lucilla Lame, MD;  Location: [place];  Service: Endoscopy;  Laterality: N/A;    There were no vitals filed for this visit.    Subjective Assessment - 09/14/20 0937    Subjective He is wearing prosthesis all awake hours.    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 No/denies    Pain Onset More than a month ago                             [facility] Adult PT Treatment/Exercise - 09/14/20 0930      Bed Mobility   Bed Mobility Rolling Right      Transfers   Transfers Sit to Stand;Stand to Sit;Floor to Transfer    Sit to Stand 5: Supervision;With upper extremity assist;With armrests;From chair/3-in-1    Sit to Stand Details Visual cues/gestures for sequencing;Verbal cues for technique    Stand to Sit 5: Supervision;With upper extremity assist;With armrests;To chair/3-in-1    Stand to Sit Details (indicate cue type and reason) Visual cues/gestures for sequencing;Verbal cues for technique    Floor to Transfer --      Ambulation/Gait   Ambulation/Gait Yes    Ambulation/Gait Assistance 5: Supervision  Ambulation/Gait Assistance Details PT compared prosthetic gait with LBQC (20' in 11.58sec & decreased stance time on prosthesis) vs straight cane stand alone tip (20' in 10.51sec & improved stance duration) vs straight cane std tip (20' in 9.48sec & most equal stance duration).    Ambulation Distance (Feet) 500 Feet   500' cane, 50' X 3 comparing canes   Assistive device Prosthesis;Straight cane;None;Large base quad cane   straight cane stand alone tip vs std tip   Gait velocity --      High Level Balance   High Level Balance Activities --    High Level Balance Comments --      Therapeutic Activites    Therapeutic Activities Work Simulation    Work Simulation PT demo & verbal cues on technique to climb an A-frame ladder with TTA prosthesis.   Pt return demo & verbalized understanding.    Other Therapeutic Activities PT used internet to show & discuss benefits to garden kneel bench for ADLs including working on his truck.      Exercises   Exercises Knee/Hip      Knee/Hip Exercises: Aerobic   Nustep seat 10 level 7 with BLEs & BUEs for 8 min as warm up      Knee/Hip Exercises: Machines for Strengthening   Cybex Leg Press shuttle BLEs 106# 20 reps 2 sets 1st set back 45* & 2nd set back flat      Prosthetics   Prosthetic Care Comments  --    Current prosthetic wear tolerance (days/week)  daily    Current prosthetic wear tolerance (#hours/day)  most of awake hours    Current prosthetic weight-bearing tolerance (hours/day)  pt tolerated 10 minutes for standing & gait without c/o limb pain or discomfort.    Edema pitting with 5 sec capillary refill    Residual limb condition  no open areas, scar invaginated slightly, sweaty skin, normal color & temperature, cylinderical shape    Education Provided --                    PT Short Term Goals - 09/12/20 1250      PT SHORT TERM GOAL #1   Title Patient demonstrates proper donning & verbalizes proper cleaning of prosthesis & socks.    Time 1    Period Months    Status Achieved    Target Date 09/14/20      PT SHORT TERM GOAL #2   Title Patient tolerates wear of prosthesis 12 hrs total / day with skin or limb pain issues.    Time 1    Period Months    Status Achieved    Target Date 09/14/20      PT SHORT TERM GOAL #3   Title Patient reaches 10" and picks up items from floor without UE support safely.    Time 1    Period Months    Status Achieved    Target Date 09/14/20      PT SHORT TERM GOAL #4   Title Patient ambulates 300' with cane & prosthesis with supervision.    Time 1    Period Months    Status Achieved    Target Date 09/14/20      PT SHORT TERM GOAL #5   Title Patient negotiates ramps & curbs with cane & prosthesis with minA.    Time 1    Period  Months    Status Achieved    Target Date 09/14/20  PT Long Term Goals - 09/12/20 1250      PT LONG TERM GOAL #1   Title Patient demonstrates & verbalizes understanding of prosthetic care to enable safe utilization of prosthesis.    Time 10    Period Weeks    Status On-going    Target Date 10/19/20      PT LONG TERM GOAL #2   Title Patient tolerates wear of prosthesis >90% of awake hours without skin or limb pain issues to enable function throughout his day.    Time 10    Period Weeks    Status On-going    Target Date 10/19/20      PT LONG TERM GOAL #3   Title [place] Balance  >/= 45/56 to indicate lower fall risk.    Time 10    Period Weeks    Status On-going    Target Date 10/19/20      PT LONG TERM GOAL #4   Title Patient ambulates 500' with cane or less and prosthesis modified independent to enable community mobility.    Time 10    Period Weeks    Status On-going    Target Date 10/19/20      PT LONG TERM GOAL #5   Title Patient negotiates ramps, curbs & stairs with cane or less and prosthesis modified independent.    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    Time 10    Period Weeks    Status On-going    Target Date 10/19/20                 Plan - 09/14/20 0814    Clinical Impression Statement PT instructed pt in garden kneel bench for activities like working on his truck and climbing A-frame ladder. He appears to have a basic  understanding. PT also assessed prosthetic gait with his LBQC vs straight cane stand alone tip vs straight cane std tip.  He is safe & more functional with straight cane std tip.    Personal Factors and Comorbidities Comorbidity 3+;Fitness    Comorbidities rt TTA, CKD, neuropathy, COPD w/ DOE, anemia, PAD s/p multiple revascularizations, HTN    Examination-Activity Limitations  Carry;Lift;Locomotion Level;Reach Overhead;Squat;Stairs;Stand;Transfers    Examination-Participation Restrictions Community Activity;Occupation    Stability/Clinical Decision Making Evolving/Moderate complexity    Rehab Potential Good    PT Frequency 2x / week    PT Duration Other (comment)   10 weeks   PT Treatment/Interventions ADLs/Self Care Home Management;DME Instruction;Gait training;Stair training;Functional mobility training;Therapeutic activities;Therapeutic exercise;Balance training;Neuromuscular re-education;Patient/family education;Prosthetic Training;Manual techniques;Vestibular    PT Next Visit Plan review prosthetic care, prosthetic gait including negotiating ramps & curbs with cane stand alone tip & prosthesis for community activities and no device for household activities. therapeutic exercise & neuromuscular for strength, flexibility, balance & endurance.    Consulted and Agree with Plan of Care Patient           Patient will benefit from skilled therapeutic intervention in order to improve the following deficits and impairments:  Abnormal gait,Cardiopulmonary status limiting activity,Decreased activity tolerance,Decreased balance,Decreased knowledge of precautions,Decreased mobility,Decreased strength,Increased edema,Postural dysfunction,Prosthetic Dependency,Pain  Visit Diagnosis: Muscle weakness (generalized)  Unsteadiness on feet  Abnormal posture  Other abnormalities of gait and mobility     Problem List Patient Active Problem  List   Diagnosis Date Noted  . Phantom limb pain ([place])   . Essential hypertension   . AKI (acute kidney injury) ([place])   . Acute blood loss anemia   . Postoperative pain   . Below-knee amputation of right lower extremity ([place]) 05/17/2020  . Ischemia of right lower extremity 01/28/2019  . Iron deficiency anemia 11/23/2018  . Stage 3 chronic kidney disease ([place]) 10/21/2018  . Atherosclerosis of native arteries of extremity with rest  pain ([place]) 10/13/2018  . PAD (peripheral artery disease) ([place]) 07/21/2018  . Ischemic leg 06/25/2018  . Claudication ([place]) 03/17/2018  . B12 deficiency 03/12/2018  . Vitamin B1 deficiency 03/12/2018  . Varicose veins of leg with pain, right 03/12/2018  . Enlarged thoracic aorta ([place]) 02/24/2018  . Alcoholism /alcohol abuse 02/24/2018  . Paresthesia 02/24/2018  . Ectatic aorta ([place]) 02/20/2018  . Emphysema lung ([place]) 02/20/2018  . Atherosclerosis of aorta ([place]) 02/20/2018  . Encounter for tobacco use cessation counseling   . Benign neoplasm of cecum   . Benign neoplasm of transverse colon   . Benign neoplasm of sigmoid colon   . Hypertension, benign 08/31/2015  . Tobacco use 08/31/2015    Jamey Reas PT, DPT 09/14/2020, 12:59 PM  [facility] [address] [place], [place], [address] Phone: [phone]   Fax:  [phone]  Name: KELCY BAETEN MRN: [id number] Date of Birth: 20-Mar-1956

## 2020-09-19 ENCOUNTER — Other Ambulatory Visit: Payer: Self-pay

## 2020-09-19 ENCOUNTER — Other Ambulatory Visit: Payer: Self-pay | Admitting: Family Medicine

## 2020-09-19 ENCOUNTER — Ambulatory Visit (INDEPENDENT_AMBULATORY_CARE_PROVIDER_SITE_OTHER): Payer: 59 | Admitting: Physical Therapy

## 2020-09-19 ENCOUNTER — Encounter: Payer: Self-pay | Admitting: Physical Therapy

## 2020-09-19 DIAGNOSIS — R293 Abnormal posture: Secondary | ICD-10-CM | POA: Diagnosis not present

## 2020-09-19 DIAGNOSIS — R2681 Unsteadiness on feet: Secondary | ICD-10-CM | POA: Diagnosis not present

## 2020-09-19 DIAGNOSIS — M6281 Muscle weakness (generalized): Secondary | ICD-10-CM | POA: Diagnosis not present

## 2020-09-19 DIAGNOSIS — I700 Atherosclerosis of aorta: Secondary | ICD-10-CM

## 2020-09-19 DIAGNOSIS — R2689 Other abnormalities of gait and mobility: Secondary | ICD-10-CM | POA: Diagnosis not present

## 2020-09-19 DIAGNOSIS — I739 Peripheral vascular disease, unspecified: Secondary | ICD-10-CM

## 2020-09-19 NOTE — Therapy (Signed)
[facility] [address] [place], [place], [address] Phone: [phone]   Fax:  [phone]  Physical Therapy Treatment  Patient Details  Name: Ryan [name] MRN: [id number] Date of Birth: Nov 16, 1955 Referring Provider (PT): Waynetta Sandy, MD   Encounter Date: 09/19/2020   PT End of Session - 09/19/20 1150    Visit Number 11    Number of Visits 20    Date for PT Re-Evaluation 10/19/20    Authorization Type [employer]    Authorization Time Period 20% coinsurance, $1500 OOP max not met    PT Start Time 1145    PT Stop Time 1227    PT Time Calculation (min) 42 min    Equipment Utilized During Treatment Gait belt    Activity Tolerance Patient tolerated treatment well    Behavior During Therapy WFL for tasks assessed/performed           Past Medical History:  Diagnosis Date  . CKD (chronic kidney disease)   . History of blood transfusion   . Hyperlipidemia   . Hypertension   . Neuromuscular disorder (HCC)    numbness feet  . Peripheral vascular disease ([place])   . Shortness of breath dyspnea    occasional     Past Surgical History:  Procedure Laterality Date  . ABDOMINAL AORTOGRAM W/LOWER EXTREMITY N/A 09/13/2019   Procedure: ABDOMINAL AORTOGRAM W/LOWER EXTREMITY;  Surgeon: Waynetta Sandy, MD;  Location: [place] [facility];  Service: Cardiovascular;  Laterality: N/A;  . AMPUTATION Right 05/09/2020   Procedure: RIGHT BELOW KNEE AMPUTATION;  Surgeon: Waynetta Sandy, MD;  Location: [place];  Service: Vascular;  Laterality: Right;  w/ a block  . COLONOSCOPY WITH PROPOFOL N/A 02/15/2016   Procedure: COLONOSCOPY WITH PROPOFOL;  Surgeon: Lucilla Lame, MD;  Location: [place];  Service: Endoscopy;  Laterality: N/A;  . FEMORAL-POPLITEAL BYPASS GRAFT Right 09/14/2019   Procedure: BYPASS GRAFT FEMORAL-POPLITEAL ARTERY;  Surgeon: Waynetta Sandy, MD;  Location: [place];  Service: Vascular;   Laterality: Right;  . HERNIA REPAIR  1999   left inguinal  . INSERTION OF ILIAC STENT Right 09/14/2019   Procedure: Insertion Of Common and External Iliac Stent;  Surgeon: Waynetta Sandy, MD;  Location: [place];  Service: Vascular;  Laterality: Right;  . LOWER EXTREMITY ANGIOGRAPHY Right 05/07/2018   Procedure: LOWER EXTREMITY ANGIOGRAPHY;  Surgeon: Algernon Huxley, MD;  Location: [place] [facility];  Service: Cardiovascular;  Laterality: Right;  . LOWER EXTREMITY ANGIOGRAPHY Right 06/25/2018   Procedure: LOWER EXTREMITY ANGIOGRAPHY;  Surgeon: Algernon Huxley, MD;  Location: [place] [facility];  Service: Cardiovascular;  Laterality: Right;  . LOWER EXTREMITY ANGIOGRAPHY Right 07/01/2018   Procedure: LOWER EXTREMITY ANGIOGRAPHY;  Surgeon: Algernon Huxley, MD;  Location: [place] [facility];  Service: Cardiovascular;  Laterality: Right;  . LOWER EXTREMITY ANGIOGRAPHY Right 08/12/2018   Procedure: LOWER EXTREMITY ANGIOGRAPHY;  Surgeon: Algernon Huxley, MD;  Location: [place] [facility];  Service: Cardiovascular;  Laterality: Right;  . LOWER EXTREMITY ANGIOGRAPHY Right 09/03/2018   Procedure: LOWER EXTREMITY ANGIOGRAPHY;  Surgeon: Algernon Huxley, MD;  Location: [place] [facility];  Service: Cardiovascular;  Laterality: Right;  . LOWER EXTREMITY ANGIOGRAPHY Right 09/04/2018   Procedure: Lower Extremity Angiography;  Surgeon: Algernon Huxley, MD;  Location: [place] [facility];  Service: Cardiovascular;  Laterality: Right;  . LOWER EXTREMITY ANGIOGRAPHY Right 10/01/2018   Procedure: LOWER EXTREMITY ANGIOGRAPHY;  Surgeon: Algernon Huxley, MD;  Location: [place] [facility];  Service: Cardiovascular;  Laterality: Right;  . LOWER EXTREMITY ANGIOGRAPHY Right 10/01/2018   Procedure: Lower Extremity Angiography;  Surgeon: Algernon Huxley, MD;  Location: [place] [facility];  Service: Cardiovascular;  Laterality: Right;  . LOWER EXTREMITY ANGIOGRAPHY Right 10/15/2018   Procedure: LOWER EXTREMITY ANGIOGRAPHY;  Surgeon:  Algernon Huxley, MD;  Location: [place] [facility];  Service: Cardiovascular;  Laterality: Right;  . LOWER EXTREMITY ANGIOGRAPHY Left 10/16/2018   Procedure: Lower Extremity Angiography;  Surgeon: Algernon Huxley, MD;  Location: [place] [facility];  Service: Cardiovascular;  Laterality: Left;  . LOWER EXTREMITY ANGIOGRAPHY Left 01/20/2019   Procedure: LOWER EXTREMITY ANGIOGRAPHY;  Surgeon: Algernon Huxley, MD;  Location: [place] [facility];  Service: Cardiovascular;  Laterality: Left;  . LOWER EXTREMITY ANGIOGRAPHY Right 01/21/2019   Procedure: Lower Extremity Angiography;  Surgeon: Algernon Huxley, MD;  Location: [place] [facility];  Service: Cardiovascular;  Laterality: Right;  . LOWER EXTREMITY ANGIOGRAPHY Right 01/28/2019   Procedure: LOWER EXTREMITY ANGIOGRAPHY;  Surgeon: Algernon Huxley, MD;  Location: [place] [facility];  Service: Cardiovascular;  Laterality: Right;  . LOWER EXTREMITY ANGIOGRAPHY Right 01/29/2019   Procedure: Lower Extremity Angiography;  Surgeon: Algernon Huxley, MD;  Location: [place] [facility];  Service: Cardiovascular;  Laterality: Right;  . LOWER EXTREMITY ANGIOGRAPHY Right 04/04/2020   Procedure: LOWER EXTREMITY ANGIOGRAPHY;  Surgeon: Waynetta Sandy, MD;  Location: [place] [facility];  Service: Cardiovascular;  Laterality: Right;  . LOWER EXTREMITY INTERVENTION Right 07/02/2018   Procedure: LOWER EXTREMITY INTERVENTION;  Surgeon: Algernon Huxley, MD;  Location: [place] [facility];  Service: Cardiovascular;  Laterality: Right;  . PERIPHERAL VASCULAR BALLOON ANGIOPLASTY Left 04/04/2020   Procedure: PERIPHERAL VASCULAR BALLOON ANGIOPLASTY;  Surgeon: Waynetta Sandy, MD;  Location: [place] [facility];  Service: Cardiovascular;  Laterality: Left;  external iliac  . POLYPECTOMY N/A 02/15/2016   Procedure: POLYPECTOMY;  Surgeon: Lucilla Lame, MD;  Location: [place];  Service: Endoscopy;  Laterality: N/A;    There were no vitals filed for this visit.    Subjective Assessment - 09/19/20 1145    Subjective He is still walking 1x/day as far as he can like PT recommended. It is improving.    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 No/denies    Pain Onset More than a month ago                             [facility] Adult PT Treatment/Exercise - 09/19/20 1145      Bed Mobility   Bed Mobility Rolling Right      Transfers   Transfers Sit to Stand;Stand to Sit;Floor to Transfer    Sit to Stand 5: Supervision;With upper extremity assist;With armrests;From chair/3-in-1    Sit to Stand Details Visual cues/gestures for sequencing;Verbal cues for technique    Stand to Sit 5: Supervision;With upper extremity assist;With armrests;To chair/3-in-1    Stand to Sit Details (indicate cue type and reason) Visual cues/gestures for sequencing;Verbal cues for technique      Ambulation/Gait   Ambulation/Gait Yes    Ambulation/Gait Assistance 5: Supervision  Ambulation Distance (Feet) 200 Feet    Assistive device Prosthesis;None      Therapeutic Activites    Therapeutic Activities --    Work Economist --    Other Therapeutic Activities --      Exercises   Exercises Knee/Hip      Knee/Hip Exercises: Aerobic   Nustep seat 11 level 8 with [place] for 8 min as warm up      Knee/Hip Exercises: Machines for Strengthening   Cybex Leg Press --      Knee/Hip Exercises: Standing   Rocker Board 1 minute   ant/mid/post & right/mid/left   Rocker Board Limitations round board w/1 pivot point, BUE light support.    SLS with Vectors RLE on foam beam tapping 3 cones lateral, ant & across midline-adduction with BUE support 10 reps      Prosthetics   Prosthetic Care Comments  Patient  has vascular graft area that is more prominant with limb shrinkage. He reports pain from prosthesis.  PT spoke with prosthetist who is going to reinsert flexible inner liner and flare out that area.  He is also going to check if shrinker are still correct size with current limb volume as last time we tried flexible inner liner, it was too tight in the mornings.    Current prosthetic wear tolerance (days/week)  daily    Current prosthetic wear tolerance (#hours/day)  most of awake hours    Current prosthetic weight-bearing tolerance (hours/day)  pt tolerated 10 minutes for standing & gait without c/o limb pain or discomfort.    Edema pitting with 5 sec capillary refill    Residual limb condition  proximal lateral area with prominant vascular graft, no open areas, scar invaginated slightly, sweaty skin, normal color & temperature, cylinderical shape    Education Provided Other (comment)   see prosthetic care comments   Person(s) Educated Patient    Education Method Explanation;Verbal cues    Education Method Verbalized understanding;Verbal cues required;Needs further instruction    Donning Prosthesis Modified independent (device/increased time)    Doffing Prosthesis Modified independent (device/increased time)               Balance Exercises - 09/19/20 1145      Balance Exercises: Standing   Standing Eyes Opened Wide (Foard);Foam/compliant surface;5 reps;Head turns    Standing Eyes Opened Limitations head turns right/left & up/down    Standing Eyes Closed Wide (BOA);Foam/compliant surface;3 reps;10 secs    Standing Eyes Closed Limitations close supervision    Stepping Strategy Anterior;Posterior;5 reps    Stepping Strategy Limitations anticipatory stepping off foam beam single leg then stabilizing alternating LEs               PT Short Term Goals - 09/12/20 1250      PT SHORT TERM GOAL #1   Title Patient demonstrates proper donning & verbalizes proper cleaning of prosthesis &  socks.    Time 1    Period Months    Status Achieved    Target Date 09/14/20      PT SHORT TERM GOAL #2   Title Patient tolerates wear of prosthesis 12 hrs total / day with skin or limb pain issues.    Time 1    Period Months    Status Achieved    Target Date 09/14/20      PT SHORT TERM GOAL #3   Title Patient reaches 10" and picks up items from floor without UE support safely.  Time 1    Period Months    Status Achieved    Target Date 09/14/20      PT SHORT TERM GOAL #4   Title Patient ambulates 300' with cane & prosthesis with supervision.    Time 1    Period Months    Status Achieved    Target Date 09/14/20      PT SHORT TERM GOAL #5   Title Patient negotiates ramps & curbs with cane & prosthesis with minA.    Time 1    Period Months    Status Achieved    Target Date 09/14/20             PT Long Term Goals - 09/12/20 1250      PT LONG TERM GOAL #1   Title Patient demonstrates & verbalizes understanding of prosthetic care to enable safe utilization of prosthesis.    Time 10    Period Weeks    Status On-going    Target Date 10/19/20      PT LONG TERM GOAL #2   Title Patient tolerates wear of prosthesis >90% of awake hours without skin or limb pain issues to enable function throughout his day.    Time 10    Period Weeks    Status On-going    Target Date 10/19/20      PT LONG TERM GOAL #3   Title [place] Balance  >/= 45/56 to indicate lower fall risk.    Time 10    Period Weeks    Status On-going    Target Date 10/19/20      PT LONG TERM GOAL #4   Title Patient ambulates 500' with cane or less and prosthesis modified independent to enable community mobility.    Time 10    Period Weeks    Status On-going    Target Date 10/19/20      PT LONG TERM GOAL #5   Title Patient negotiates ramps, curbs & stairs with cane or less and prosthesis modified independent.    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    Time 10    Period Weeks    Status On-going    Target Date 10/19/20                 Plan - 09/19/20 1150    Clinical Impression Statement Patient's residual limb has shrunk which is typical with first prosthesis.  Vascular bypass graft has become more prominent at proximal lateral area.  PT discussed issue with prosthetist who is going to try flexible inner liner with relief to area.  PT session worked on balance activities facilitating ankle/residual limb, hip & step strategies.    Personal Factors and Comorbidities Comorbidity 3+;Fitness    Comorbidities rt TTA, CKD, neuropathy, COPD w/ DOE, anemia, PAD s/p multiple revascularizations, HTN    Examination-Activity Limitations Carry;Lift;Locomotion Level;Reach Overhead;Squat;Stairs;Stand;Transfers    Examination-Participation Restrictions Community Activity;Occupation    Stability/Clinical Decision Making Evolving/Moderate complexity    Rehab Potential Good    PT Frequency 2x / week    PT Duration Other (comment)   10 weeks   PT Treatment/Interventions ADLs/Self Care Home Management;DME Instruction;Gait training;Stair training;Functional mobility training;Therapeutic activities;Therapeutic exercise;Balance training;Neuromuscular re-education;Patient/family education;Prosthetic Training;Manual techniques;Vestibular    PT Next Visit Plan review prosthetic care,  prosthetic gait including negotiating ramps & curbs with cane stand alone tip & prosthesis for community activities and no device for household activities. therapeutic exercise & neuromuscular for strength, flexibility, balance & endurance.    Consulted and Agree with Plan of Care Patient           Patient will benefit from skilled therapeutic intervention in order to improve the following deficits and impairments:  Abnormal gait,Cardiopulmonary status limiting activity,Decreased  activity tolerance,Decreased balance,Decreased knowledge of precautions,Decreased mobility,Decreased strength,Increased edema,Postural dysfunction,Prosthetic Dependency,Pain  Visit Diagnosis: Muscle weakness (generalized)  Unsteadiness on feet  Abnormal posture  Other abnormalities of gait and mobility     Problem List Patient Active Problem List   Diagnosis Date Noted  . Phantom limb pain ([place])   . Essential hypertension   . AKI (acute kidney injury) ([place])   . Acute blood loss anemia   . Postoperative pain   . Below-knee amputation of right lower extremity ([place]) 05/17/2020  . Ischemia of right lower extremity 01/28/2019  . Iron deficiency anemia 11/23/2018  . Stage 3 chronic kidney disease ([place]) 10/21/2018  . Atherosclerosis of native arteries of extremity with rest pain ([place]) 10/13/2018  . PAD (peripheral artery disease) ([place]) 07/21/2018  . Ischemic leg 06/25/2018  . Claudication ([place]) 03/17/2018  . B12 deficiency 03/12/2018  . Vitamin B1 deficiency 03/12/2018  . Varicose veins of leg with pain, right 03/12/2018  . Enlarged thoracic aorta ([place]) 02/24/2018  . Alcoholism /alcohol abuse 02/24/2018  . Paresthesia 02/24/2018  . Ectatic aorta ([place]) 02/20/2018  . Emphysema lung ([place]) 02/20/2018  . Atherosclerosis of aorta ([place]) 02/20/2018  . Encounter for tobacco use cessation counseling   . Benign neoplasm of cecum   . Benign neoplasm of transverse colon   . Benign neoplasm of sigmoid colon   . Hypertension, benign 08/31/2015  . Tobacco use 08/31/2015    Jamey Reas, PT, DPT 09/19/2020, 12:40 PM  [facility] [address] [place], [place], [address] Phone: [phone]   Fax:  [phone]  Name: LATEEF JUNCAJ MRN: [id number] Date of Birth: Oct 31, 1955

## 2020-09-21 ENCOUNTER — Encounter: Payer: Self-pay | Admitting: Physical Therapy

## 2020-09-21 ENCOUNTER — Ambulatory Visit (INDEPENDENT_AMBULATORY_CARE_PROVIDER_SITE_OTHER): Payer: 59 | Admitting: Physical Therapy

## 2020-09-21 ENCOUNTER — Other Ambulatory Visit: Payer: Self-pay

## 2020-09-21 DIAGNOSIS — R2681 Unsteadiness on feet: Secondary | ICD-10-CM | POA: Diagnosis not present

## 2020-09-21 DIAGNOSIS — M6281 Muscle weakness (generalized): Secondary | ICD-10-CM

## 2020-09-21 DIAGNOSIS — R2689 Other abnormalities of gait and mobility: Secondary | ICD-10-CM | POA: Diagnosis not present

## 2020-09-21 DIAGNOSIS — R293 Abnormal posture: Secondary | ICD-10-CM

## 2020-09-21 NOTE — Therapy (Signed)
[facility] [address] [place], [place], [address] Phone: [phone]   Fax:  [phone]  Physical Therapy Treatment  Patient Details  Name: Ryan [name] MRN: [id number] Date of Birth: [birth year] Referring Provider (PT): Waynetta Sandy, MD   Encounter Date: 09/21/2020   PT End of Session - 09/21/20 1151    Visit Number 12    Number of Visits 20    Date for PT Re-Evaluation 10/19/20    Authorization Type [employer]    Authorization Time Period 20% coinsurance, $1500 OOP max not met    PT Start Time 1146    PT Stop Time 1227    PT Time Calculation (min) 41 min    Equipment Utilized During Treatment Gait belt    Activity Tolerance Patient tolerated treatment well    Behavior During Therapy [facility] for tasks assessed/performed           Past Medical History:  Diagnosis Date  . CKD (chronic kidney disease)   . History of blood transfusion   . Hyperlipidemia   . Hypertension   . Neuromuscular disorder (HCC)    numbness feet  . Peripheral vascular disease ([place])   . Shortness of breath dyspnea    occasional     Past Surgical History:  Procedure Laterality Date  . ABDOMINAL AORTOGRAM W/LOWER EXTREMITY N/A 09/13/2019   Procedure: ABDOMINAL AORTOGRAM W/LOWER EXTREMITY;  Surgeon: Waynetta Sandy, MD;  Location: [place] [facility];  Service: Cardiovascular;  Laterality: N/A;  . AMPUTATION Right 05/09/2020   Procedure: RIGHT BELOW KNEE AMPUTATION;  Surgeon: Waynetta Sandy, MD;  Location: [place];  Service: Vascular;  Laterality: Right;  w/ a block  . COLONOSCOPY WITH PROPOFOL N/A 02/15/2016   Procedure: COLONOSCOPY WITH PROPOFOL;  Surgeon: Lucilla Lame, MD;  Location: [place];  Service: Endoscopy;  Laterality: N/A;  . FEMORAL-POPLITEAL BYPASS GRAFT Right 09/14/2019   Procedure: BYPASS GRAFT FEMORAL-POPLITEAL ARTERY;  Surgeon: Waynetta Sandy, MD;  Location: [place];  Service: Vascular;   Laterality: Right;  . HERNIA REPAIR  1999   left inguinal  . INSERTION OF ILIAC STENT Right 09/14/2019   Procedure: Insertion Of Common and External Iliac Stent;  Surgeon: Waynetta Sandy, MD;  Location: [place];  Service: Vascular;  Laterality: Right;  . LOWER EXTREMITY ANGIOGRAPHY Right 05/07/2018   Procedure: LOWER EXTREMITY ANGIOGRAPHY;  Surgeon: Algernon Huxley, MD;  Location: [place] [facility];  Service: Cardiovascular;  Laterality: Right;  . LOWER EXTREMITY ANGIOGRAPHY Right 06/25/2018   Procedure: LOWER EXTREMITY ANGIOGRAPHY;  Surgeon: Algernon Huxley, MD;  Location: [place] [facility];  Service: Cardiovascular;  Laterality: Right;  . LOWER EXTREMITY ANGIOGRAPHY Right 07/01/2018   Procedure: LOWER EXTREMITY ANGIOGRAPHY;  Surgeon: Algernon Huxley, MD;  Location: [place] [facility];  Service: Cardiovascular;  Laterality: Right;  . LOWER EXTREMITY ANGIOGRAPHY Right 08/12/2018   Procedure: LOWER EXTREMITY ANGIOGRAPHY;  Surgeon: Algernon Huxley, MD;  Location: [place] [facility];  Service: Cardiovascular;  Laterality: Right;  . LOWER EXTREMITY ANGIOGRAPHY Right 09/03/2018   Procedure: LOWER EXTREMITY ANGIOGRAPHY;  Surgeon: Algernon Huxley, MD;  Location: [place] [facility];  Service: Cardiovascular;  Laterality: Right;  . LOWER EXTREMITY ANGIOGRAPHY Right 09/04/2018   Procedure: Lower Extremity Angiography;  Surgeon: Algernon Huxley, MD;  Location: [place] [facility];  Service: Cardiovascular;  Laterality: Right;  . LOWER EXTREMITY ANGIOGRAPHY Right 10/01/2018   Procedure: LOWER EXTREMITY ANGIOGRAPHY;  Surgeon: Algernon Huxley, MD;  Location: [place] [facility];  Service: Cardiovascular;  Laterality: Right;  . LOWER EXTREMITY ANGIOGRAPHY Right 10/01/2018   Procedure: Lower Extremity Angiography;  Surgeon: Algernon Huxley, MD;  Location: [place] [facility];  Service: Cardiovascular;  Laterality: Right;  . LOWER EXTREMITY ANGIOGRAPHY Right 10/15/2018   Procedure: LOWER EXTREMITY ANGIOGRAPHY;  Surgeon:  Algernon Huxley, MD;  Location: [place] [facility];  Service: Cardiovascular;  Laterality: Right;  . LOWER EXTREMITY ANGIOGRAPHY Left 10/16/2018   Procedure: Lower Extremity Angiography;  Surgeon: Algernon Huxley, MD;  Location: [place] [facility];  Service: Cardiovascular;  Laterality: Left;  . LOWER EXTREMITY ANGIOGRAPHY Left 01/20/2019   Procedure: LOWER EXTREMITY ANGIOGRAPHY;  Surgeon: Algernon Huxley, MD;  Location: [place] [facility];  Service: Cardiovascular;  Laterality: Left;  . LOWER EXTREMITY ANGIOGRAPHY Right 01/21/2019   Procedure: Lower Extremity Angiography;  Surgeon: Algernon Huxley, MD;  Location: [place] [facility];  Service: Cardiovascular;  Laterality: Right;  . LOWER EXTREMITY ANGIOGRAPHY Right 01/28/2019   Procedure: LOWER EXTREMITY ANGIOGRAPHY;  Surgeon: Algernon Huxley, MD;  Location: [place] [facility];  Service: Cardiovascular;  Laterality: Right;  . LOWER EXTREMITY ANGIOGRAPHY Right 01/29/2019   Procedure: Lower Extremity Angiography;  Surgeon: Algernon Huxley, MD;  Location: [place] [facility];  Service: Cardiovascular;  Laterality: Right;  . LOWER EXTREMITY ANGIOGRAPHY Right 04/04/2020   Procedure: LOWER EXTREMITY ANGIOGRAPHY;  Surgeon: Waynetta Sandy, MD;  Location: [place] [facility];  Service: Cardiovascular;  Laterality: Right;  . LOWER EXTREMITY INTERVENTION Right 07/02/2018   Procedure: LOWER EXTREMITY INTERVENTION;  Surgeon: Algernon Huxley, MD;  Location: [place] [facility];  Service: Cardiovascular;  Laterality: Right;  . PERIPHERAL VASCULAR BALLOON ANGIOPLASTY Left 04/04/2020   Procedure: PERIPHERAL VASCULAR BALLOON ANGIOPLASTY;  Surgeon: Waynetta Sandy, MD;  Location: [place] [facility];  Service: Cardiovascular;  Laterality: Left;  external iliac  . POLYPECTOMY N/A 02/15/2016   Procedure: POLYPECTOMY;  Surgeon: Lucilla Lame, MD;  Location: [place];  Service: Endoscopy;  Laterality: N/A;    There were no vitals filed for this visit.    Subjective Assessment - 09/21/20 1146    Subjective Prosthetist put flexible liner in socket and gave him smaller shrinkers. He did put a relief over bypass graft and it helped. He has been able to donne prosthesis both mornings unlike last time we tried flexible liner.    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 No/denies    Pain Onset More than a month ago                             [facility] Adult PT Treatment/Exercise - 09/21/20 1146      Bed Mobility   Bed Mobility --      Transfers   Transfers Sit to Stand;Stand to Sit;Floor to Transfer    Sit to Stand 6: Modified independent (Device/Increase time);With upper extremity assist;With armrests;From chair/3-in-1    Sit to Stand Details --    Stand to Sit 6: Modified independent (Device/Increase time);With upper extremity assist;With armrests;To chair/3-in-1    Stand to Sit Details (indicate cue type and reason) --  Ambulation/Gait   Ambulation/Gait Yes    Ambulation/Gait Assistance 5: Supervision    Ambulation Distance (Feet) 300 Feet    Assistive device Prosthesis;None    Ramp 5: Supervision   prosthesis only   Ramp Details (indicate cue type and reason) verbal cues on proper weight shift    Curb 5: Supervision   prosthesis only     High Level Balance   High Level Balance Activities Backward walking;Other (comment)   walking with eyes closed to simulate walking in dark & picking up pace   High Level Balance Comments demo & vrebal cues on technique      Exercises   Exercises Knee/Hip      Knee/Hip Exercises: Aerobic   Nustep seat 11 level 8 with BLEs & BUEs for 8 min as warm up      Knee/Hip Exercises: Standing   Rocker Board 1 minute    ant/mid/post & right/mid/left   Rocker Board Limitations round board w/1 pivot point, BUE light support.    SLS with Vectors RLE on foam beam tapping 3 cones lateral, ant & across midline-adduction with BUE support 10 reps      Prosthetics   Prosthetic Care Comments  Continue with flexible liner wear & smaller shrinker.  PT demo & verbal cues on how to dry prosthetic foot when coming inside from rain.    Current prosthetic wear tolerance (days/week)  daily    Current prosthetic wear tolerance (#hours/day)  most of awake hours    Current prosthetic weight-bearing tolerance (hours/day)  pt tolerated 10 minutes for standing & gait without c/o limb pain or discomfort.    Edema pitting with 5 sec capillary refill    Residual limb condition  proximal lateral area with prominant vascular graft, no open areas, scar invaginated slightly, sweaty skin, normal color & temperature, cylinderical shape    Education Provided Other (comment)   see prosthetic care comments   Person(s) Educated Patient    Education Method Explanation;Verbal cues    Education Method Verbalized understanding;Verbal cues required;Needs further instruction    Donning Prosthesis Modified independent (device/increased time)    Doffing Prosthesis Modified independent (device/increased time)               Balance Exercises - 09/21/20 1145      Balance Exercises: Standing   Standing Eyes Opened Wide ([place]);Foam/compliant surface;5 reps;Head turns    Standing Eyes Opened Limitations head turns right/left, up/down & dialgonals    Standing Eyes Closed Wide (BOA);Foam/compliant surface;3 reps;10 secs    Standing Eyes Closed Limitations close supervision    Stepping Strategy Anterior;Posterior;5 reps    Stepping Strategy Limitations anticipatory stepping off foam beam single leg then stabilizing alternating LEs;  progressed to reactionary with PT pushing / pullling off balance. Posterior with RLE requires assist to prevent fall.                PT Short Term Goals - 09/12/20 1250      PT SHORT TERM GOAL #1   Title Patient demonstrates proper donning & verbalizes proper cleaning of prosthesis & socks.    Time 1    Period Months    Status Achieved    Target Date 09/14/20      PT SHORT TERM GOAL #2   Title Patient tolerates wear of prosthesis 12 hrs total / day with skin or limb pain issues.    Time 1    Period Months    Status Achieved    Target Date  09/14/20      PT SHORT TERM GOAL #3   Title Patient reaches 10" and picks up items from floor without UE support safely.    Time 1    Period Months    Status Achieved    Target Date 09/14/20      PT SHORT TERM GOAL #4   Title Patient ambulates 300' with cane & prosthesis with supervision.    Time 1    Period Months    Status Achieved    Target Date 09/14/20      PT SHORT TERM GOAL #5   Title Patient negotiates ramps & curbs with cane & prosthesis with minA.    Time 1    Period Months    Status Achieved    Target Date 09/14/20             PT Long Term Goals - 09/12/20 1250      PT LONG TERM GOAL #1   Title Patient demonstrates & verbalizes understanding of prosthetic care to enable safe utilization of prosthesis.    Time 10    Period Weeks    Status On-going    Target Date 10/19/20      PT LONG TERM GOAL #2   Title Patient tolerates wear of prosthesis >90% of awake hours without skin or limb pain issues to enable function throughout his day.    Time 10    Period Weeks    Status On-going    Target Date 10/19/20      PT LONG TERM GOAL #3   Title [place] Balance  >/= 45/56 to indicate lower fall risk.    Time 10    Period Weeks    Status On-going    Target Date 10/19/20      PT LONG TERM GOAL #4   Title Patient ambulates 500' with cane or less and prosthesis modified independent to enable community mobility.    Time 10    Period Weeks    Status On-going    Target Date 10/19/20      PT LONG TERM GOAL #5   Title Patient negotiates  ramps, curbs & stairs with cane or less and prosthesis modified independent.    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    Time 10    Period Weeks    Status On-going    Target Date 10/19/20                 Plan - 09/21/20 1152    Clinical Impression Statement PT session worked on improving balance reactions using ankle / residual limb, hip & step strategies which is improving.  PT also worked on prosthetic gait in various situations which he improved with instruction.    Personal Factors and Comorbidities Comorbidity 3+;Fitness    Comorbidities rt TTA, CKD, neuropathy, COPD w/ DOE, anemia, PAD s/p multiple revascularizations, HTN    Examination-Activity Limitations Carry;Lift;Locomotion Level;Reach Overhead;Squat;Stairs;Stand;Transfers    Examination-Participation Restrictions Community Activity;Occupation    Stability/Clinical Decision Making Evolving/Moderate complexity    Rehab Potential Good    PT Frequency 2x / week    PT Duration Other (comment)   10 weeks   PT Treatment/Interventions ADLs/Self Care Home Management;DME Instruction;Gait training;Stair training;Functional mobility training;Therapeutic activities;Therapeutic exercise;Balance training;Neuromuscular re-education;Patient/family education;Prosthetic Training;Manual techniques;Vestibular  PT Next Visit Plan review prosthetic care, prosthetic gait including negotiating ramps & curbs with cane stand alone tip & prosthesis for community activities and no device for household activities. therapeutic exercise & neuromuscular for strength, flexibility, balance & endurance.    Consulted and Agree with Plan of Care Patient           Patient will benefit from skilled therapeutic intervention in order to improve the following deficits and impairments:  Abnormal gait,Cardiopulmonary  status limiting activity,Decreased activity tolerance,Decreased balance,Decreased knowledge of precautions,Decreased mobility,Decreased strength,Increased edema,Postural dysfunction,Prosthetic Dependency,Pain  Visit Diagnosis: Muscle weakness (generalized)  Unsteadiness on feet  Abnormal posture  Other abnormalities of gait and mobility     Problem List Patient Active Problem List   Diagnosis Date Noted  . Phantom limb pain ([place])   . Essential hypertension   . AKI (acute kidney injury) ([place])   . Acute blood loss anemia   . Postoperative pain   . Below-knee amputation of right lower extremity ([place]) 05/17/2020  . Ischemia of right lower extremity 01/28/2019  . Iron deficiency anemia 11/23/2018  . Stage 3 chronic kidney disease ([place]) 10/21/2018  . Atherosclerosis of native arteries of extremity with rest pain ([place]) 10/13/2018  . PAD (peripheral artery disease) ([place]) 07/21/2018  . Ischemic leg 06/25/2018  . Claudication ([place]) 03/17/2018  . B12 deficiency 03/12/2018  . Vitamin B1 deficiency 03/12/2018  . Varicose veins of leg with pain, right 03/12/2018  . Enlarged thoracic aorta ([place]) 02/24/2018  . Alcoholism /alcohol abuse 02/24/2018  . Paresthesia 02/24/2018  . Ectatic aorta ([place]) 02/20/2018  . Emphysema lung ([place]) 02/20/2018  . Atherosclerosis of aorta ([place]) 02/20/2018  . Encounter for tobacco use cessation counseling   . Benign neoplasm of cecum   . Benign neoplasm of transverse colon   . Benign neoplasm of sigmoid colon   . Hypertension, benign 08/31/2015  . Tobacco use 08/31/2015    Jamey Reas, PT, DPT 09/21/2020, 12:35 PM  [facility] [address] [place], [place], [address] Phone: [phone]   Fax:  [phone]  Name: Ryan [name] MRN: [id number] Date of Birth: 11/25/1955

## 2020-09-22 ENCOUNTER — Encounter: Payer: Self-pay | Admitting: Physician Assistant

## 2020-09-22 ENCOUNTER — Ambulatory Visit (INDEPENDENT_AMBULATORY_CARE_PROVIDER_SITE_OTHER)
Admission: RE | Admit: 2020-09-22 | Discharge: 2020-09-22 | Disposition: A | Discharge status: Home / Self Care | Payer: 59 | Source: Ambulatory Visit | Attending: Vascular Surgery | Admitting: Vascular Surgery

## 2020-09-22 ENCOUNTER — Ambulatory Visit (INDEPENDENT_AMBULATORY_CARE_PROVIDER_SITE_OTHER): Payer: 59 | Admitting: Physician Assistant

## 2020-09-22 ENCOUNTER — Ambulatory Visit (HOSPITAL_COMMUNITY)
Admission: RE | Admit: 2020-09-22 | Discharge: 2020-09-22 | Disposition: A | Discharge status: Home / Self Care | Payer: 59 | Source: Ambulatory Visit | Attending: Vascular Surgery | Admitting: Vascular Surgery

## 2020-09-22 VITALS — BP 120/84 | HR 76 | Temp 97.8°F | Resp 20 | Ht 67.0 in | Wt 156.7 lb

## 2020-09-22 DIAGNOSIS — I739 Peripheral vascular disease, unspecified: Secondary | ICD-10-CM | POA: Insufficient documentation

## 2020-09-22 DIAGNOSIS — I70221 Atherosclerosis of native arteries of extremities with rest pain, right leg: Secondary | ICD-10-CM | POA: Insufficient documentation

## 2020-09-22 NOTE — Progress Notes (Signed)
History of Present Illness:  Patient is a 65 y.o. year old male who presents for evaluation of claudication.  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  He also has intervention of the left LE with left ext. Iliac stent placement.    He denise pain in the right LE no symptoms of claudication, rest pain or non healing wounds.  The right LE lateral bypass is very superficial and it gets irritated by his prosthetic limb.  He denise any open wounds on the right BKA.    Past Medical History:  Diagnosis Date  . CKD (chronic kidney disease)   . History of blood transfusion   . Hyperlipidemia   . Hypertension   . Neuromuscular disorder (HCC)    numbness feet  . Peripheral vascular disease ([place])   . Shortness of breath dyspnea    occasional     Past Surgical History:  Procedure Laterality Date  . ABDOMINAL AORTOGRAM W/LOWER EXTREMITY N/A 09/13/2019   Procedure: ABDOMINAL AORTOGRAM W/LOWER EXTREMITY;  Surgeon: Waynetta Sandy, MD;  Location: [place] [facility];  Service: Cardiovascular;  Laterality: N/A;  . AMPUTATION Right 05/09/2020   Procedure: RIGHT BELOW KNEE AMPUTATION;  Surgeon: Waynetta Sandy, MD;  Location: [place];  Service: Vascular;  Laterality: Right;  w/ a block  . COLONOSCOPY WITH PROPOFOL N/A 02/15/2016   Procedure: COLONOSCOPY WITH PROPOFOL;  Surgeon: Lucilla Lame, MD;  Location: [place];  Service: Endoscopy;  Laterality: N/A;  . FEMORAL-POPLITEAL BYPASS GRAFT Right 09/14/2019   Procedure: BYPASS GRAFT FEMORAL-POPLITEAL ARTERY;  Surgeon: Waynetta Sandy, MD;  Location: [place];  Service: Vascular;  Laterality: Right;  . HERNIA REPAIR  1999   left inguinal  . INSERTION OF ILIAC STENT Right 09/14/2019   Procedure: Insertion Of Common and External Iliac Stent;  Surgeon: Waynetta Sandy, MD;  Location: [place];  Service: Vascular;  Laterality: Right;  . LOWER EXTREMITY ANGIOGRAPHY  Right 05/07/2018   Procedure: LOWER EXTREMITY ANGIOGRAPHY;  Surgeon: Algernon Huxley, MD;  Location: [place] [facility];  Service: Cardiovascular;  Laterality: Right;  . LOWER EXTREMITY ANGIOGRAPHY Right 06/25/2018   Procedure: LOWER EXTREMITY ANGIOGRAPHY;  Surgeon: Algernon Huxley, MD;  Location: [place] [facility];  Service: Cardiovascular;  Laterality: Right;  . LOWER EXTREMITY ANGIOGRAPHY Right 07/01/2018   Procedure: LOWER EXTREMITY ANGIOGRAPHY;  Surgeon: Algernon Huxley, MD;  Location: [place] [facility];  Service: Cardiovascular;  Laterality: Right;  . LOWER EXTREMITY ANGIOGRAPHY Right 08/12/2018   Procedure: LOWER EXTREMITY ANGIOGRAPHY;  Surgeon: Algernon Huxley, MD;  Location: [place] [facility];  Service: Cardiovascular;  Laterality: Right;  . LOWER EXTREMITY ANGIOGRAPHY Right 09/03/2018   Procedure: LOWER EXTREMITY ANGIOGRAPHY;  Surgeon: Algernon Huxley, MD;  Location: [place] [facility];  Service: Cardiovascular;  Laterality: Right;  . LOWER EXTREMITY ANGIOGRAPHY Right 09/04/2018   Procedure: Lower Extremity Angiography;  Surgeon: Algernon Huxley, MD;  Location: [place] [facility];  Service: Cardiovascular;  Laterality: Right;  . LOWER EXTREMITY ANGIOGRAPHY Right 10/01/2018   Procedure: LOWER EXTREMITY ANGIOGRAPHY;  Surgeon: Algernon Huxley, MD;  Location: [place] [facility];  Service: Cardiovascular;  Laterality: Right;  . LOWER EXTREMITY ANGIOGRAPHY Right 10/01/2018   Procedure: Lower Extremity Angiography;  Surgeon: Algernon Huxley, MD;  Location: [place] [facility];  Service: Cardiovascular;  Laterality: Right;  . LOWER EXTREMITY ANGIOGRAPHY Right 10/15/2018   Procedure: LOWER  EXTREMITY ANGIOGRAPHY;  Surgeon: Algernon Huxley, MD;  Location: [place] [facility];  Service: Cardiovascular;  Laterality: Right;  . LOWER EXTREMITY ANGIOGRAPHY Left 10/16/2018   Procedure: Lower Extremity Angiography;  Surgeon: Algernon Huxley, MD;  Location: [place] [facility];  Service: Cardiovascular;  Laterality: Left;  .  LOWER EXTREMITY ANGIOGRAPHY Left 01/20/2019   Procedure: LOWER EXTREMITY ANGIOGRAPHY;  Surgeon: Algernon Huxley, MD;  Location: [place] [facility];  Service: Cardiovascular;  Laterality: Left;  . LOWER EXTREMITY ANGIOGRAPHY Right 01/21/2019   Procedure: Lower Extremity Angiography;  Surgeon: Algernon Huxley, MD;  Location: [place] [facility];  Service: Cardiovascular;  Laterality: Right;  . LOWER EXTREMITY ANGIOGRAPHY Right 01/28/2019   Procedure: LOWER EXTREMITY ANGIOGRAPHY;  Surgeon: Algernon Huxley, MD;  Location: [place] [facility];  Service: Cardiovascular;  Laterality: Right;  . LOWER EXTREMITY ANGIOGRAPHY Right 01/29/2019   Procedure: Lower Extremity Angiography;  Surgeon: Algernon Huxley, MD;  Location: [place] [facility];  Service: Cardiovascular;  Laterality: Right;  . LOWER EXTREMITY ANGIOGRAPHY Right 04/04/2020   Procedure: LOWER EXTREMITY ANGIOGRAPHY;  Surgeon: Waynetta Sandy, MD;  Location: [place] [facility];  Service: Cardiovascular;  Laterality: Right;  . LOWER EXTREMITY INTERVENTION Right 07/02/2018   Procedure: LOWER EXTREMITY INTERVENTION;  Surgeon: Algernon Huxley, MD;  Location: [place] [facility];  Service: Cardiovascular;  Laterality: Right;  . PERIPHERAL VASCULAR BALLOON ANGIOPLASTY Left 04/04/2020   Procedure: PERIPHERAL VASCULAR BALLOON ANGIOPLASTY;  Surgeon: Waynetta Sandy, MD;  Location: [place] [facility];  Service: Cardiovascular;  Laterality: Left;  external iliac  . POLYPECTOMY N/A 02/15/2016   Procedure: POLYPECTOMY;  Surgeon: Lucilla Lame, MD;  Location: [facility];  Service: Endoscopy;  Laterality: N/A;    ROS:   General:  No weight loss, Fever, chills  HEENT: No recent headaches, no nasal bleeding, no visual changes, no sore throat  Neurologic: No dizziness, blackouts, seizures. No recent symptoms of stroke or mini- stroke. No recent episodes of slurred speech, or temporary blindness.  Cardiac: No recent episodes of chest pain/pressure,  no shortness of breath at rest.  No shortness of breath with exertion.  Denies history of atrial fibrillation or irregular heartbeat  Vascular: No history of rest pain in feet.  No history of claudication.  No history of non-healing ulcer, No history of DVT   Pulmonary: No home oxygen, no productive cough, no hemoptysis,  No asthma or wheezing  Musculoskeletal:  [ ]  Arthritis, [ ]  Low back pain,  [ ]  Joint pain  Hematologic:No history of hypercoagulable state.  No history of easy bleeding.  No history of anemia  Gastrointestinal: No hematochezia or melena,  No gastroesophageal reflux, no trouble swallowing  Urinary: [ ]  chronic Kidney disease, [ ]  on HD - [ ]  MWF or [ ]  TTHS, [ ]  Burning with urination, [ ]  Frequent urination, [ ]  Difficulty urinating;   Skin: No rashes  Psychological: No history of anxiety,  No history of depression  Social History Social History   Tobacco Use  . Smoking status: Former Smoker    Packs/day: 0.50    Years: 40.00    Pack years: 20.00    Types: Cigarettes    Start date: 07/21/1978    Quit date: 12/2018    Years since quitting: 1.7  . Smokeless tobacco: Never Used  Vaping Use  . Vaping Use: Never used  Substance Use Topics  . Alcohol use: Not Currently    Alcohol/week: 12.0 standard  drinks    Types: 12 Cans of beer per week    Comment: 4 beers / week now per pt 05/05/20  . Drug use: Never    Family History Family History  Problem Relation Age of Onset  . COPD Mother   . Hypertension Father   . Heart attack Brother     Allergies  No Known Allergies   Current Outpatient Medications  Medication Sig Dispense Refill  . albuterol (VENTOLIN HFA) 108 (90 Base) MCG/ACT inhaler Inhale 2 puffs into the lungs every 6 (six) hours as needed for wheezing or shortness of breath. 18 g 0  . amLODipine-valsartan (EXFORGE) 5-160 MG tablet Take 1 tablet by mouth daily. 90 tablet 0  . ascorbic acid (VITAMIN C) 500 MG tablet Take 1 tablet (500 mg total)  by mouth daily. 30 tablet 0  . aspirin EC 81 MG tablet Take 1 tablet (81 mg total) by mouth daily. 90 tablet 3  . atorvastatin (LIPITOR) 20 MG tablet Take 1 tablet by mouth once daily 90 tablet 0  . Cholecalciferol (DIALYVITE VITAMIN D 5000) 125 MCG (5000 UT) capsule Take 5,000 Units by mouth daily.    . clopidogrel (PLAVIX) 75 MG tablet Take 1 tablet by mouth once daily 30 tablet 0  . Cyanocobalamin (B-12) 500 MCG SUBL Place 1 tablet under the tongue daily. (Patient taking differently: Place 500 mcg under the tongue daily.) 30 tablet 0  . DULoxetine (CYMBALTA) 60 MG capsule Take 1 capsule (60 mg total) by mouth daily. 90 capsule 1  . Fluticasone-Umeclidin-Vilant (TRELEGY ELLIPTA) 100-62.5-25 MCG/INH AEPB Inhale 1 puff into the lungs daily as needed (shortness of breath). 60 each 5  . gabapentin (NEURONTIN) 100 MG capsule Take 1 capsule (100 mg total) by mouth 3 (three) times daily. 270 capsule 0  . Multiple Vitamin (MULTIVITAMIN WITH MINERALS) TABS tablet Take 1 tablet by mouth daily.    . pantoprazole (PROTONIX) 40 MG tablet Take 1 tablet by mouth once daily 30 tablet 0  . thiamine (VITAMIN B-1) 50 MG tablet Take 1 tablet (50 mg total) by mouth daily. 30 tablet 2  . traZODone (DESYREL) 50 MG tablet Take 1.5 tablets (75 mg total) by mouth at bedtime. 135 tablet 0   No current facility-administered medications for this visit.    Physical Examination  Vitals:   09/22/20 0916  BP: 120/84  Pulse: 76  Resp: 20  Temp: 97.8 F (36.6 C)  TempSrc: Temporal  SpO2: 97%  Weight: 156 lb 11.2 oz (71.1 kg)  Height: 5\' 7"  (1.702 m)    Body mass index is 24.54 kg/m.  General:  Alert and oriented, no acute distress HEENT: Normal Neck: No bruit or JVD Pulmonary: Clear to auscultation bilaterally Cardiac: Regular Rate and Rhythm without murmur Abdomen: Soft, non-tender, non-distended, no mass, no scars Skin: No rash, right BKA stump well healed.  No palpable flow in the right ringed bypass.  No  open wounds. Extremity Pulses:  2+ radial, brachial, femoral, left posterior tibial pulse Musculoskeletal: No deformity or edema  Neurologic: Upper and lower extremity motor 5/5 and symmetric  DATA:    ABI Findings:  +--------+------------------+-----+--------+--------+  Right  Rt Pressure (mmHg)IndexWaveformComment   +--------+------------------+-----+--------+--------+  [id number]                     +--------+------------------+-----+--------+--------+   +---------+------------------+-----+---------+-------+  Left   Lt Pressure (mmHg)IndexWaveform Comment  +---------+------------------+-----+---------+-------+  Brachial 142                      +---------+------------------+-----+---------+-------+  PTA   159        1.12 triphasic      +---------+------------------+-----+---------+-------+  DP    150        1.06 triphasic      +---------+------------------+-----+---------+-------+  Great Toe72        0.51 Abnormal       +---------+------------------+-----+---------+-------+   +-------+-----------+-----------+------------+------------+  ABI/TBIToday's ABIToday's TBIPrevious ABIPrevious TBI  +-------+-----------+-----------+------------+------------+  Right BKA                        +-------+-----------+-----------+------------+------------+  Left  1.12    0.51    1..01    0.96      +-------+-----------+-----------+------------+------------+   Left TBIs appear decreased.    Summary:  Left: Resting left ankle-brachial index is within normal range. No  evidence of significant left lower extremity arterial disease. The left  toe-brachial index is abnormal.    Right Common & External Stent(s):  +---------------+---++---------+-------------+  Prox to Stent        CIA stent NV    +---------------+---++---------+-------------+  Proximal Stent 175     NWV prox area  +---------------+---++---------+-------------+  Mid Stent   81 biphasic          +---------------+---++---------+-------------+  Distal Stent  120triphasic         +---------------+---++---------+-------------+  Distal to Stent122triphasic         +---------------+---++---------+-------------+   Right CFA 115 cm/s triphasic waveform. Left CFA 176 cm/s triphasic  waveform.   Summary:  The visualized segments of the common and external iliac stent on the  right appears patent. Suboptimal exam.     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  He also has intervention of the left LE with left ext. Iliac stent placement.   His left LE stent is patent with palpable PT pulse.  He remains asymptomatic for claudication, non healing wounds and no rest pain.  The residual right LE bypass graft is very superficial.  Dr. [name] examined it.  If he develops a problem with non healing wound over the bypass due to prosthetic wear we will address that at the time.  Possible revision of distal bypass only if necessary in the future.    F/U in 6 months for repeat iliac duplex and ABI's with right BKA check.  He will call sooner if he has problems or concerns.  Cont. ASA Plavix and Lipitor for maximum medical improvement.      Roxy Horseman PA-C [facility] Office: [phone]  MD in clinic [place]

## 2020-09-25 ENCOUNTER — Other Ambulatory Visit: Payer: Self-pay | Admitting: Family Medicine

## 2020-09-25 DIAGNOSIS — N183 Chronic kidney disease, stage 3 unspecified: Secondary | ICD-10-CM

## 2020-09-25 DIAGNOSIS — I10 Essential (primary) hypertension: Secondary | ICD-10-CM

## 2020-09-25 NOTE — Telephone Encounter (Signed)
Requested Prescriptions  Pending Prescriptions Disp Refills  . amLODipine-valsartan (EXFORGE) 5-160 MG tablet [Pharmacy Med Name: amLODIPine Besylate-Valsartan 5-160 MG Oral Tablet] 90 tablet 0    Sig: Take 1 tablet by mouth once daily     Cardiovascular: CCB + ARB Combos Failed - 09/25/2020  9:33 AM      Failed - Cr in normal range and within 180 days    Creat  Date Value Ref Range Status  03/14/2020 1.51 (H) 0.70 - 1.25 mg/dL Final    Comment:    For patients >49 years of age, the reference limit for Creatinine is approximately 13% higher for people identified as African-American. .    Creatinine, Ser  Date Value Ref Range Status  05/23/2020 1.43 (H) 0.61 - 1.24 mg/dL Final         Passed - K in normal range and within 180 days    Potassium  Date Value Ref Range Status  05/23/2020 4.5 3.5 - 5.1 mmol/L Final         Passed - Patient is not pregnant      Passed - Last BP in normal range    BP Readings from Last 1 Encounters:  09/22/20 120/84         Passed - Valid encounter within last 6 months    Recent Outpatient Visits          2 months ago PAD (peripheral artery disease) [facility])   [facility], Drue Stager, MD   3 months ago History of amputation below knee, right [facility])   [facility] Steele Sizer, MD   6 months ago PAD (peripheral artery disease) [facility])   [facility] Steele Sizer, MD   10 months ago PAD (peripheral artery disease) [facility])   [facility] Steele Sizer, MD   11 months ago Anemia, unspecified type   [facility] Steele Sizer, MD      Future Appointments            In 3 weeks Steele Sizer, MD [facility], [facility]

## 2020-09-26 ENCOUNTER — Other Ambulatory Visit: Payer: Self-pay

## 2020-09-26 ENCOUNTER — Encounter: Payer: Self-pay | Admitting: Physical Therapy

## 2020-09-26 ENCOUNTER — Ambulatory Visit (INDEPENDENT_AMBULATORY_CARE_PROVIDER_SITE_OTHER): Payer: 59 | Admitting: Physical Therapy

## 2020-09-26 DIAGNOSIS — R293 Abnormal posture: Secondary | ICD-10-CM | POA: Diagnosis not present

## 2020-09-26 DIAGNOSIS — M6281 Muscle weakness (generalized): Secondary | ICD-10-CM | POA: Diagnosis not present

## 2020-09-26 DIAGNOSIS — R2689 Other abnormalities of gait and mobility: Secondary | ICD-10-CM

## 2020-09-26 DIAGNOSIS — R2681 Unsteadiness on feet: Secondary | ICD-10-CM

## 2020-09-26 DIAGNOSIS — I739 Peripheral vascular disease, unspecified: Secondary | ICD-10-CM

## 2020-09-26 NOTE — Therapy (Signed)
[facility] [address] [place], [place], [address] Phone: [phone]   Fax:  [phone]  Physical Therapy Treatment  Patient Details  Name: Ryan [name] MRN: [id number] Date of Birth: [birth year] Referring Provider (PT): Waynetta Sandy, MD   Encounter Date: 09/26/2020   PT End of Session - 09/26/20 1145    Visit Number 13    Number of Visits 20    Date for PT Re-Evaluation 10/19/20    Authorization Type [employer]    Authorization Time Period 20% coinsurance, $1500 OOP max not met    PT Start Time 1145    PT Stop Time 1228    PT Time Calculation (min) 43 min    Equipment Utilized During Treatment Gait belt    Activity Tolerance Patient tolerated treatment well    Behavior During Therapy WFL for tasks assessed/performed           Past Medical History:  Diagnosis Date  . CKD (chronic kidney disease)   . History of blood transfusion   . Hyperlipidemia   . Hypertension   . Neuromuscular disorder (HCC)    numbness feet  . Peripheral vascular disease ([place])   . Shortness of breath dyspnea    occasional     Past Surgical History:  Procedure Laterality Date  . ABDOMINAL AORTOGRAM W/LOWER EXTREMITY N/A 09/13/2019   Procedure: ABDOMINAL AORTOGRAM W/LOWER EXTREMITY;  Surgeon: Waynetta Sandy, MD;  Location: [place] [facility];  Service: Cardiovascular;  Laterality: N/A;  . AMPUTATION Right 05/09/2020   Procedure: RIGHT BELOW KNEE AMPUTATION;  Surgeon: Waynetta Sandy, MD;  Location: [place];  Service: Vascular;  Laterality: Right;  w/ a block  . COLONOSCOPY WITH PROPOFOL N/A 02/15/2016   Procedure: COLONOSCOPY WITH PROPOFOL;  Surgeon: Lucilla Lame, MD;  Location: [place];  Service: Endoscopy;  Laterality: N/A;  . FEMORAL-POPLITEAL BYPASS GRAFT Right 09/14/2019   Procedure: BYPASS GRAFT FEMORAL-POPLITEAL ARTERY;  Surgeon: Waynetta Sandy, MD;  Location: [place];  Service: Vascular;   Laterality: Right;  . HERNIA REPAIR  1999   left inguinal  . INSERTION OF ILIAC STENT Right 09/14/2019   Procedure: Insertion Of Common and External Iliac Stent;  Surgeon: Waynetta Sandy, MD;  Location: [place];  Service: Vascular;  Laterality: Right;  . LOWER EXTREMITY ANGIOGRAPHY Right 05/07/2018   Procedure: LOWER EXTREMITY ANGIOGRAPHY;  Surgeon: Algernon Huxley, MD;  Location: [place] [facility];  Service: Cardiovascular;  Laterality: Right;  . LOWER EXTREMITY ANGIOGRAPHY Right 06/25/2018   Procedure: LOWER EXTREMITY ANGIOGRAPHY;  Surgeon: Algernon Huxley, MD;  Location: [place] [facility];  Service: Cardiovascular;  Laterality: Right;  . LOWER EXTREMITY ANGIOGRAPHY Right 07/01/2018   Procedure: LOWER EXTREMITY ANGIOGRAPHY;  Surgeon: Algernon Huxley, MD;  Location: [place] [facility];  Service: Cardiovascular;  Laterality: Right;  . LOWER EXTREMITY ANGIOGRAPHY Right 08/12/2018   Procedure: LOWER EXTREMITY ANGIOGRAPHY;  Surgeon: Algernon Huxley, MD;  Location: [place] [facility];  Service: Cardiovascular;  Laterality: Right;  . LOWER EXTREMITY ANGIOGRAPHY Right 09/03/2018   Procedure: LOWER EXTREMITY ANGIOGRAPHY;  Surgeon: Algernon Huxley, MD;  Location: [place] [facility];  Service: Cardiovascular;  Laterality: Right;  . LOWER EXTREMITY ANGIOGRAPHY Right 09/04/2018   Procedure: Lower Extremity Angiography;  Surgeon: Algernon Huxley, MD;  Location: [place] [facility];  Service: Cardiovascular;  Laterality: Right;  . LOWER EXTREMITY ANGIOGRAPHY Right 10/01/2018   Procedure: LOWER EXTREMITY ANGIOGRAPHY;  Surgeon: Algernon Huxley, MD;  Location: [place] [facility];  Service: Cardiovascular;  Laterality: Right;  . LOWER EXTREMITY ANGIOGRAPHY Right 10/01/2018   Procedure: Lower Extremity Angiography;  Surgeon: Algernon Huxley, MD;  Location: [place] [facility];  Service: Cardiovascular;  Laterality: Right;  . LOWER EXTREMITY ANGIOGRAPHY Right 10/15/2018   Procedure: LOWER EXTREMITY ANGIOGRAPHY;  Surgeon:  Algernon Huxley, MD;  Location: [place] [facility];  Service: Cardiovascular;  Laterality: Right;  . LOWER EXTREMITY ANGIOGRAPHY Left 10/16/2018   Procedure: Lower Extremity Angiography;  Surgeon: Algernon Huxley, MD;  Location: [place] [facility];  Service: Cardiovascular;  Laterality: Left;  . LOWER EXTREMITY ANGIOGRAPHY Left 01/20/2019   Procedure: LOWER EXTREMITY ANGIOGRAPHY;  Surgeon: Algernon Huxley, MD;  Location: [place] [facility];  Service: Cardiovascular;  Laterality: Left;  . LOWER EXTREMITY ANGIOGRAPHY Right 01/21/2019   Procedure: Lower Extremity Angiography;  Surgeon: Algernon Huxley, MD;  Location: [place] [facility];  Service: Cardiovascular;  Laterality: Right;  . LOWER EXTREMITY ANGIOGRAPHY Right 01/28/2019   Procedure: LOWER EXTREMITY ANGIOGRAPHY;  Surgeon: Algernon Huxley, MD;  Location: [place] [facility];  Service: Cardiovascular;  Laterality: Right;  . LOWER EXTREMITY ANGIOGRAPHY Right 01/29/2019   Procedure: Lower Extremity Angiography;  Surgeon: Algernon Huxley, MD;  Location: [place] [facility];  Service: Cardiovascular;  Laterality: Right;  . LOWER EXTREMITY ANGIOGRAPHY Right 04/04/2020   Procedure: LOWER EXTREMITY ANGIOGRAPHY;  Surgeon: Waynetta Sandy, MD;  Location: [place] [facility];  Service: Cardiovascular;  Laterality: Right;  . LOWER EXTREMITY INTERVENTION Right 07/02/2018   Procedure: LOWER EXTREMITY INTERVENTION;  Surgeon: Algernon Huxley, MD;  Location: [place] [facility];  Service: Cardiovascular;  Laterality: Right;  . PERIPHERAL VASCULAR BALLOON ANGIOPLASTY Left 04/04/2020   Procedure: PERIPHERAL VASCULAR BALLOON ANGIOPLASTY;  Surgeon: Waynetta Sandy, MD;  Location: [place] [facility];  Service: Cardiovascular;  Laterality: Left;  external iliac  . POLYPECTOMY N/A 02/15/2016   Procedure: POLYPECTOMY;  Surgeon: Lucilla Lame, MD;  Location: [place];  Service: Endoscopy;  Laterality: N/A;    There were no vitals filed for this visit.    Subjective Assessment - 09/26/20 1146    Subjective He had great weekend with grandson party & to church.  No issues.    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 No/denies    Pain Onset More than a month ago                             [facility] Adult PT Treatment/Exercise - 09/26/20 1145      Transfers   Transfers Sit to Stand;Stand to Sit;Floor to Transfer    Sit to Stand 6: Modified independent (Device/Increase time);With upper extremity assist;With armrests;From chair/3-in-1    Stand to Sit 6: Modified independent (Device/Increase time);With upper extremity assist;With armrests;To chair/3-in-1      Ambulation/Gait   Ambulation/Gait Yes    Ambulation/Gait Assistance 5: Supervision    Ambulation Distance (Feet) 300 Feet    Assistive device Prosthesis;None    Ramp 5: Supervision   prosthesis only   Curb 5: Supervision   prosthesis only   Pre-Gait Activities in //bars with BUE support prosthetic  foot on incline then stepping LLE up & down 10 reps ea to work on incline gait.      High Level Balance   High Level Balance Activities Backward walking;Other (comment)   walking with eyes closed to simulate walking in dark & picking up pace   High Level Balance Comments demo & vrebal cues on technique      Exercises   Exercises Knee/Hip      Knee/Hip Exercises: Aerobic   Nustep seat 11 level 8 with BLEs & BUEs for 8 min as warm up      Knee/Hip Exercises: Machines for Strengthening   Cybex Leg Press shuttle BLEs 106# 20 reps 2 sets 1st set back 45* & 2nd set back flat      Knee/Hip Exercises: Standing   Rocker Board 1 minute   ant/mid/post & right/mid/left   Rocker Board Limitations round board w/1 pivot point,  BUE light support.    SLS with Vectors RLE on foam beam tapping 3 cones lateral, ant & across midline-adduction with BUE support 10 reps      Prosthetics   Prosthetic Care Comments  Continue with flexible liner wear & smaller shrinker.  PT demo & verbal cues on how to dry prosthetic foot when coming inside from rain.    Current prosthetic wear tolerance (days/week)  daily    Current prosthetic wear tolerance (#hours/day)  most of awake hours    Current prosthetic weight-bearing tolerance (hours/day)  pt tolerated 10 minutes for standing & gait without c/o limb pain or discomfort.    Edema pitting with 5 sec capillary refill    Residual limb condition  proximal lateral area with prominant vascular graft, no open areas, scar invaginated slightly, sweaty skin, normal color & temperature, cylinderical shape    Education Provided Other (comment)   see prosthetic care comments              Balance Exercises - 09/26/20 1145      Balance Exercises: Standing   Standing Eyes Opened Wide (BOA);Foam/compliant surface;5 reps;Head turns    Standing Eyes Opened Limitations head turns right/left, up/down & dialgonals    Standing Eyes Closed Wide (BOA);Foam/compliant surface;3 reps;10 secs    Standing Eyes Closed Limitations close supervision    Stepping Strategy Anterior;Posterior;5 reps    Stepping Strategy Limitations anticipatory stepping off foam beam single leg then stabilizing alternating LEs;  progressed to reactionary with PT pushing / pullling off balance. Posterior with RLE requires assist to prevent fall.               PT Short Term Goals - 09/12/20 1250      PT SHORT TERM GOAL #1   Title Patient demonstrates proper donning & verbalizes proper cleaning of prosthesis & socks.    Time 1    Period Months    Status Achieved    Target Date 09/14/20      PT SHORT TERM GOAL #2   Title Patient tolerates wear of prosthesis 12 hrs total / day with skin or limb pain issues.    Time 1     Period Months    Status Achieved    Target Date 09/14/20      PT SHORT TERM GOAL #3   Title Patient reaches 10" and picks up items from floor without UE support safely.    Time 1    Period Months    Status Achieved    Target Date 09/14/20      PT  SHORT TERM GOAL #4   Title Patient ambulates 300' with cane & prosthesis with supervision.    Time 1    Period Months    Status Achieved    Target Date 09/14/20      PT SHORT TERM GOAL #5   Title Patient negotiates ramps & curbs with cane & prosthesis with minA.    Time 1    Period Months    Status Achieved    Target Date 09/14/20             PT Long Term Goals - 09/12/20 1250      PT LONG TERM GOAL #1   Title Patient demonstrates & verbalizes understanding of prosthetic care to enable safe utilization of prosthesis.    Time 10    Period Weeks    Status On-going    Target Date 10/19/20      PT LONG TERM GOAL #2   Title Patient tolerates wear of prosthesis >90% of awake hours without skin or limb pain issues to enable function throughout his day.    Time 10    Period Weeks    Status On-going    Target Date 10/19/20      PT LONG TERM GOAL #3   Title Berg Balance  >/= 45/56 to indicate lower fall risk.    Time 10    Period Weeks    Status On-going    Target Date 10/19/20      PT LONG TERM GOAL #4   Title Patient ambulates 500' with cane or less and prosthesis modified independent to enable community mobility.    Time 10    Period Weeks    Status On-going    Target Date 10/19/20      PT LONG TERM GOAL #5   Title Patient negotiates ramps, curbs & stairs with cane or less and prosthesis modified independent.    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    Time 10    Period Weeks    Status On-going    Target Date 10/19/20                 Plan - 09/26/20 1145     Clinical Impression Statement Patient would like to return to work which he feels that he can do.  After hearing job description, PT recommends returning initially 3 nonconsecutive days/wk thru April to enable to build activity tolerance. Pt agrees with PT.    Personal Factors and Comorbidities Comorbidity 3+;Fitness    Comorbidities rt TTA, CKD, neuropathy, COPD w/ DOE, anemia, PAD s/p multiple revascularizations, HTN    Examination-Activity Limitations Carry;Lift;Locomotion Level;Reach Overhead;Squat;Stairs;Stand;Transfers    Examination-Participation Restrictions Community Activity;Occupation    Stability/Clinical Decision Making Evolving/Moderate complexity    Rehab Potential Good    PT Frequency 2x / week    PT Duration Other (comment)   10 weeks   PT Treatment/Interventions ADLs/Self Care Home Management;DME Instruction;Gait training;Stair training;Functional mobility training;Therapeutic activities;Therapeutic exercise;Balance training;Neuromuscular re-education;Patient/family education;Prosthetic Training;Manual techniques;Vestibular    PT Next Visit Plan review prosthetic care, prosthetic gait including negotiating ramps & curbs with cane stand alone tip & prosthesis for community activities and no device for household activities. therapeutic exercise & neuromuscular for strength, flexibility, balance & endurance.    Consulted and Agree with Plan  of Care Patient           Patient will benefit from skilled therapeutic intervention in order to improve the following deficits and impairments:  Abnormal gait,Cardiopulmonary status limiting activity,Decreased activity tolerance,Decreased balance,Decreased knowledge of precautions,Decreased mobility,Decreased strength,Increased edema,Postural dysfunction,Prosthetic Dependency,Pain  Visit Diagnosis: Muscle weakness (generalized)  Unsteadiness on feet  Abnormal posture  Other abnormalities of gait and mobility     Problem  List Patient Active Problem List   Diagnosis Date Noted  . Phantom limb pain ([place])   . Essential hypertension   . AKI (acute kidney injury) ([place])   . Acute blood loss anemia   . Postoperative pain   . Below-knee amputation of right lower extremity ([place]) 05/17/2020  . Ischemia of right lower extremity 01/28/2019  . Iron deficiency anemia 11/23/2018  . Stage 3 chronic kidney disease ([place]) 10/21/2018  . Atherosclerosis of native arteries of extremity with rest pain ([place]) 10/13/2018  . PAD (peripheral artery disease) ([place]) 07/21/2018  . Ischemic leg 06/25/2018  . Claudication ([place]) 03/17/2018  . B12 deficiency 03/12/2018  . Vitamin B1 deficiency 03/12/2018  . Varicose veins of leg with pain, right 03/12/2018  . Enlarged thoracic aorta ([place]) 02/24/2018  . Alcoholism /alcohol abuse 02/24/2018  . Paresthesia 02/24/2018  . Ectatic aorta ([place]) 02/20/2018  . Emphysema lung ([place]) 02/20/2018  . Atherosclerosis of aorta ([place]) 02/20/2018  . Encounter for tobacco use cessation counseling   . Benign neoplasm of cecum   . Benign neoplasm of transverse colon   . Benign neoplasm of sigmoid colon   . Hypertension, benign 08/31/2015  . Tobacco use 08/31/2015    Jamey Reas, PT, DPT 09/26/2020, 12:37 PM  [facility] [address] [place], [place], [address] Phone: [phone]   Fax:  [phone]  Name: NORTH ESTERLINE MRN: [id number] Date of Birth: 10/16/1955

## 2020-09-27 ENCOUNTER — Other Ambulatory Visit: Payer: Self-pay | Admitting: Family Medicine

## 2020-09-28 ENCOUNTER — Other Ambulatory Visit: Payer: Self-pay

## 2020-09-28 ENCOUNTER — Ambulatory Visit (INDEPENDENT_AMBULATORY_CARE_PROVIDER_SITE_OTHER): Payer: 59 | Admitting: Physical Therapy

## 2020-09-28 ENCOUNTER — Encounter: Payer: Self-pay | Admitting: Physical Therapy

## 2020-09-28 DIAGNOSIS — R293 Abnormal posture: Secondary | ICD-10-CM

## 2020-09-28 DIAGNOSIS — R2689 Other abnormalities of gait and mobility: Secondary | ICD-10-CM | POA: Diagnosis not present

## 2020-09-28 DIAGNOSIS — R2681 Unsteadiness on feet: Secondary | ICD-10-CM

## 2020-09-28 DIAGNOSIS — M6281 Muscle weakness (generalized): Secondary | ICD-10-CM

## 2020-09-28 NOTE — Therapy (Signed)
[facility] [address] [place], [place], [address] Phone: [phone]   Fax:  [phone]  Physical Therapy Treatment  Patient Details  Name: Ryan [name] MRN: [id number] Date of Birth: 1956-06-02 Referring Provider (PT): Waynetta Sandy, MD   Encounter Date: 09/28/2020   PT End of Session - 09/28/20 1149    Visit Number 14    Number of Visits 20    Date for PT Re-Evaluation 10/19/20    Authorization Type [employer]    Authorization Time Period 20% coinsurance, $1500 OOP max not met    PT Start Time 1145    PT Stop Time 1230    PT Time Calculation (min) 45 min    Equipment Utilized During Treatment Gait belt    Activity Tolerance Patient tolerated treatment well    Behavior During Therapy WFL for tasks assessed/performed           Past Medical History:  Diagnosis Date  . CKD (chronic kidney disease)   . History of blood transfusion   . Hyperlipidemia   . Hypertension   . Neuromuscular disorder (HCC)    numbness feet  . Peripheral vascular disease ([place])   . Shortness of breath dyspnea    occasional     Past Surgical History:  Procedure Laterality Date  . ABDOMINAL AORTOGRAM W/LOWER EXTREMITY N/A 09/13/2019   Procedure: ABDOMINAL AORTOGRAM W/LOWER EXTREMITY;  Surgeon: Waynetta Sandy, MD;  Location: [place] [facility];  Service: Cardiovascular;  Laterality: N/A;  . AMPUTATION Right 05/09/2020   Procedure: RIGHT BELOW KNEE AMPUTATION;  Surgeon: Waynetta Sandy, MD;  Location: [place];  Service: Vascular;  Laterality: Right;  w/ a block  . COLONOSCOPY WITH PROPOFOL N/A 02/15/2016   Procedure: COLONOSCOPY WITH PROPOFOL;  Surgeon: Lucilla Lame, MD;  Location: [place];  Service: Endoscopy;  Laterality: N/A;  . FEMORAL-POPLITEAL BYPASS GRAFT Right 09/14/2019   Procedure: BYPASS GRAFT FEMORAL-POPLITEAL ARTERY;  Surgeon: Waynetta Sandy, MD;  Location: [place];  Service: Vascular;   Laterality: Right;  . HERNIA REPAIR  1999   left inguinal  . INSERTION OF ILIAC STENT Right 09/14/2019   Procedure: Insertion Of Common and External Iliac Stent;  Surgeon: Waynetta Sandy, MD;  Location: [place];  Service: Vascular;  Laterality: Right;  . LOWER EXTREMITY ANGIOGRAPHY Right 05/07/2018   Procedure: LOWER EXTREMITY ANGIOGRAPHY;  Surgeon: Algernon Huxley, MD;  Location: [place] [facility];  Service: Cardiovascular;  Laterality: Right;  . LOWER EXTREMITY ANGIOGRAPHY Right 06/25/2018   Procedure: LOWER EXTREMITY ANGIOGRAPHY;  Surgeon: Algernon Huxley, MD;  Location: [place] [facility];  Service: Cardiovascular;  Laterality: Right;  . LOWER EXTREMITY ANGIOGRAPHY Right 07/01/2018   Procedure: LOWER EXTREMITY ANGIOGRAPHY;  Surgeon: Algernon Huxley, MD;  Location: [place] [facility];  Service: Cardiovascular;  Laterality: Right;  . LOWER EXTREMITY ANGIOGRAPHY Right 08/12/2018   Procedure: LOWER EXTREMITY ANGIOGRAPHY;  Surgeon: Algernon Huxley, MD;  Location: [place] [facility];  Service: Cardiovascular;  Laterality: Right;  . LOWER EXTREMITY ANGIOGRAPHY Right 09/03/2018   Procedure: LOWER EXTREMITY ANGIOGRAPHY;  Surgeon: Algernon Huxley, MD;  Location: [place] [facility];  Service: Cardiovascular;  Laterality: Right;  . LOWER EXTREMITY ANGIOGRAPHY Right 09/04/2018   Procedure: Lower Extremity Angiography;  Surgeon: Algernon Huxley, MD;  Location: [place] [facility];  Service: Cardiovascular;  Laterality: Right;  . LOWER EXTREMITY ANGIOGRAPHY Right 10/01/2018   Procedure: LOWER EXTREMITY ANGIOGRAPHY;  Surgeon: Algernon Huxley, MD;  Location: [place] [facility];  Service: Cardiovascular;  Laterality: Right;  . LOWER EXTREMITY ANGIOGRAPHY Right 10/01/2018   Procedure: Lower Extremity Angiography;  Surgeon: Algernon Huxley, MD;  Location: [place] [facility];  Service: Cardiovascular;  Laterality: Right;  . LOWER EXTREMITY ANGIOGRAPHY Right 10/15/2018   Procedure: LOWER EXTREMITY ANGIOGRAPHY;  Surgeon:  Algernon Huxley, MD;  Location: [place] [facility];  Service: Cardiovascular;  Laterality: Right;  . LOWER EXTREMITY ANGIOGRAPHY Left 10/16/2018   Procedure: Lower Extremity Angiography;  Surgeon: Algernon Huxley, MD;  Location: [place] [facility];  Service: Cardiovascular;  Laterality: Left;  . LOWER EXTREMITY ANGIOGRAPHY Left 01/20/2019   Procedure: LOWER EXTREMITY ANGIOGRAPHY;  Surgeon: Algernon Huxley, MD;  Location: [place] [facility];  Service: Cardiovascular;  Laterality: Left;  . LOWER EXTREMITY ANGIOGRAPHY Right 01/21/2019   Procedure: Lower Extremity Angiography;  Surgeon: Algernon Huxley, MD;  Location: [place] [facility];  Service: Cardiovascular;  Laterality: Right;  . LOWER EXTREMITY ANGIOGRAPHY Right 01/28/2019   Procedure: LOWER EXTREMITY ANGIOGRAPHY;  Surgeon: Algernon Huxley, MD;  Location: [place] [facility];  Service: Cardiovascular;  Laterality: Right;  . LOWER EXTREMITY ANGIOGRAPHY Right 01/29/2019   Procedure: Lower Extremity Angiography;  Surgeon: Algernon Huxley, MD;  Location: [place] [facility];  Service: Cardiovascular;  Laterality: Right;  . LOWER EXTREMITY ANGIOGRAPHY Right 04/04/2020   Procedure: LOWER EXTREMITY ANGIOGRAPHY;  Surgeon: Waynetta Sandy, MD;  Location: [place] [facility];  Service: Cardiovascular;  Laterality: Right;  . LOWER EXTREMITY INTERVENTION Right 07/02/2018   Procedure: LOWER EXTREMITY INTERVENTION;  Surgeon: Algernon Huxley, MD;  Location: [place] [facility];  Service: Cardiovascular;  Laterality: Right;  . PERIPHERAL VASCULAR BALLOON ANGIOPLASTY Left 04/04/2020   Procedure: PERIPHERAL VASCULAR BALLOON ANGIOPLASTY;  Surgeon: Waynetta Sandy, MD;  Location: [place] [facility];  Service: Cardiovascular;  Laterality: Left;  external iliac  . POLYPECTOMY N/A 02/15/2016   Procedure: POLYPECTOMY;  Surgeon: Lucilla Lame, MD;  Location: [place];  Service: Endoscopy;  Laterality: N/A;    There were no vitals filed for this visit.    Subjective Assessment - 09/28/20 1145    Subjective He reports that his job said they don't need him now.    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 No/denies    Pain Onset More than a month ago                             [facility] Adult PT Treatment/Exercise - 09/28/20 1145      Transfers   Transfers Sit to Stand;Stand to Sit;Floor to Transfer    Sit to Stand 6: Modified independent (Device/Increase time);With upper extremity assist;With armrests;From chair/3-in-1    Stand to Sit 6: Modified independent (Device/Increase time);With upper extremity assist;With armrests;To chair/3-in-1      Ambulation/Gait   Ambulation/Gait Yes    Ambulation/Gait Assistance 5: Supervision    Ambulation Distance (Feet) 300 Feet    Assistive device Prosthesis;None    Ramp 5: Supervision   prosthesis only   Curb 5: Supervision   prosthesis only     High Level Balance   High Level Balance  Activities --    High Level Balance Comments --      Neuro Re-ed    Neuro Re-ed Details  green theraband pulling right knee into flexion if he relaxes.  Pt holding right terminal knee extension while stepping 4 directions flexion, extension, abduction & adduction returning to bilateral stance between ea direction. 5 reps. In //bars with no touch needed.      Exercises   Exercises Knee/Hip      Knee/Hip Exercises: Aerobic   Nustep seat 11 level 8 with BLEs & BUEs for 8 min as warm up      Knee/Hip Exercises: Machines for Strengthening   Cybex Leg Press shuttle BLEs 112# 20 reps 2 sets 1st set back 45* & 2nd set back flat      Knee/Hip Exercises: Standing   Terminal Knee Extension Strengthening;Right;3 sets;15 reps;Theraband    Theraband  Level (Terminal Knee Extension) Level 3 (Green)    Terminal Knee Extension Limitations 1st set in bilateral stance, 2nd set in terminal stance, 3rd set in initial contact    Rocker Board 1 minute   ant/mid/post & right/mid/left   Rocker Board Limitations round board w/1 pivot point, BUE light support.    SLS with Vectors RLE on foam beam tapping 3 cones lateral, ant & across midline-adduction with BUE support 10 reps      Prosthetics   Prosthetic Care Comments  Reviewed need to adjust ply socks during day as weighting/unweighting prosthesis can pump fluid from limb.    Current prosthetic wear tolerance (days/week)  daily    Current prosthetic wear tolerance (#hours/day)  most of awake hours    Current prosthetic weight-bearing tolerance (hours/day)  pt tolerated 10 minutes for standing & gait without c/o limb pain or discomfort.    Edema pitting with 5 sec capillary refill    Residual limb condition  proximal lateral area with prominant vascular graft, no open areas, scar invaginated slightly, sweaty skin, normal color & temperature, cylinderical shape    Education Provided Other (comment);Correct ply sock adjustment   see prosthetic care comments   Person(s) Educated Patient    Education Method Explanation;Demonstration;Verbal cues    Education Method Verbalized understanding;Verbal cues required;Needs further instruction    Donning Prosthesis Modified independent (device/increased time)    Doffing Prosthesis Modified independent (device/increased time)                    PT Short Term Goals - 09/12/20 1250      PT SHORT TERM GOAL #1   Title Patient demonstrates proper donning & verbalizes proper cleaning of prosthesis & socks.    Time 1    Period Months    Status Achieved    Target Date 09/14/20      PT SHORT TERM GOAL #2   Title Patient tolerates wear of prosthesis 12 hrs total / day with skin or limb pain issues.    Time 1    Period Months    Status Achieved     Target Date 09/14/20      PT SHORT TERM GOAL #3   Title Patient reaches 10" and picks up items from floor without UE support safely.    Time 1    Period Months    Status Achieved    Target Date 09/14/20      PT SHORT TERM GOAL #4   Title Patient ambulates 300' with cane & prosthesis with supervision.    Time 1    Period Months  Status Achieved    Target Date 09/14/20      PT SHORT TERM GOAL #5   Title Patient negotiates ramps & curbs with cane & prosthesis with minA.    Time 1    Period Months    Status Achieved    Target Date 09/14/20             PT Long Term Goals - 09/12/20 1250      PT LONG TERM GOAL #1   Title Patient demonstrates & verbalizes understanding of prosthetic care to enable safe utilization of prosthesis.    Time 10    Period Weeks    Status On-going    Target Date 10/19/20      PT LONG TERM GOAL #2   Title Patient tolerates wear of prosthesis >90% of awake hours without skin or limb pain issues to enable function throughout his day.    Time 10    Period Weeks    Status On-going    Target Date 10/19/20      PT LONG TERM GOAL #3   Title [place] Balance  >/= 45/56 to indicate lower fall risk.    Time 10    Period Weeks    Status On-going    Target Date 10/19/20      PT LONG TERM GOAL #4   Title Patient ambulates 500' with cane or less and prosthesis modified independent to enable community mobility.    Time 10    Period Weeks    Status On-going    Target Date 10/19/20      PT LONG TERM GOAL #5   Title Patient negotiates ramps, curbs & stairs with cane or less and prosthesis modified independent.    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    Time 10    Period Weeks    Status On-going    Target Date 10/19/20                 Plan - 09/28/20 1150    Clinical Impression Statement PT made  referral to Vocational Rehabilitation to assist with return to employment.  PT session continues to focus on improving function with prostheis including strength, muscle endurance, balance & flexibility.    Personal Factors and Comorbidities Comorbidity 3+;Fitness    Comorbidities rt TTA, CKD, neuropathy, COPD w/ DOE, anemia, PAD s/p multiple revascularizations, HTN    Examination-Activity Limitations Carry;Lift;Locomotion Level;Reach Overhead;Squat;Stairs;Stand;Transfers    Examination-Participation Restrictions Community Activity;Occupation    Stability/Clinical Decision Making Evolving/Moderate complexity    Rehab Potential Good    PT Frequency 2x / week    PT Duration Other (comment)   10 weeks   PT Treatment/Interventions ADLs/Self Care Home Management;DME Instruction;Gait training;Stair training;Functional mobility training;Therapeutic activities;Therapeutic exercise;Balance training;Neuromuscular re-education;Patient/family education;Prosthetic Training;Manual techniques;Vestibular    PT Next Visit Plan prosthetic training without device including ramps & curbs,  therapeutic exercise & standing balance activities.    Recommended Other Services Vocational Rehabiliatation    Consulted and Agree with Plan of Care Patient           Patient will benefit from skilled therapeutic intervention in order to improve the following deficits and impairments:  Abnormal gait,Cardiopulmonary status limiting activity,Decreased activity tolerance,Decreased balance,Decreased knowledge of precautions,Decreased mobility,Decreased strength,Increased edema,Postural dysfunction,Prosthetic Dependency,Pain  Visit Diagnosis: Muscle  weakness (generalized)  Unsteadiness on feet  Abnormal posture  Other abnormalities of gait and mobility     Problem List Patient Active Problem List   Diagnosis Date Noted  . Phantom limb pain ([place])   . Essential hypertension   . AKI (acute kidney injury) ([place])   . Acute  blood loss anemia   . Postoperative pain   . Below-knee amputation of right lower extremity ([place]) 05/17/2020  . Ischemia of right lower extremity 01/28/2019  . Iron deficiency anemia 11/23/2018  . Stage 3 chronic kidney disease ([place]) 10/21/2018  . Atherosclerosis of native arteries of extremity with rest pain ([place]) 10/13/2018  . PAD (peripheral artery disease) ([place]) 07/21/2018  . Ischemic leg 06/25/2018  . Claudication ([place]) 03/17/2018  . B12 deficiency 03/12/2018  . Vitamin B1 deficiency 03/12/2018  . Varicose veins of leg with pain, right 03/12/2018  . Enlarged thoracic aorta ([place]) 02/24/2018  . Alcoholism /alcohol abuse 02/24/2018  . Paresthesia 02/24/2018  . Ectatic aorta ([place]) 02/20/2018  . Emphysema lung ([place]) 02/20/2018  . Atherosclerosis of aorta ([place]) 02/20/2018  . Encounter for tobacco use cessation counseling   . Benign neoplasm of cecum   . Benign neoplasm of transverse colon   . Benign neoplasm of sigmoid colon   . Hypertension, benign 08/31/2015  . Tobacco use 08/31/2015    Jamey Reas, PT, DPT 09/28/2020, 12:42 PM  [facility] [address] [place], [place], [address] Phone: [phone]   Fax:  [phone]  Name: Ryan [name] MRN: [id number] Date of Birth: 07-18-1955

## 2020-10-03 ENCOUNTER — Encounter: Payer: 59 | Admitting: Rehabilitation

## 2020-10-05 ENCOUNTER — Other Ambulatory Visit: Payer: Self-pay

## 2020-10-05 ENCOUNTER — Ambulatory Visit (INDEPENDENT_AMBULATORY_CARE_PROVIDER_SITE_OTHER): Payer: 59 | Admitting: Physical Therapy

## 2020-10-05 ENCOUNTER — Encounter: Payer: Self-pay | Admitting: Physical Therapy

## 2020-10-05 DIAGNOSIS — R293 Abnormal posture: Secondary | ICD-10-CM

## 2020-10-05 DIAGNOSIS — R2681 Unsteadiness on feet: Secondary | ICD-10-CM

## 2020-10-05 DIAGNOSIS — M6281 Muscle weakness (generalized): Secondary | ICD-10-CM | POA: Diagnosis not present

## 2020-10-05 DIAGNOSIS — R2689 Other abnormalities of gait and mobility: Secondary | ICD-10-CM

## 2020-10-05 NOTE — Therapy (Signed)
[facility] [address] [place], [place], [address] Phone: [phone]   Fax:  [phone]  Physical Therapy Treatment  Patient Details  Name: Ryan [name] MRN: [id number] Date of Birth: [birth year] Referring Provider (PT): Waynetta Sandy, MD   Encounter Date: 10/05/2020   PT End of Session - 10/05/20 1145    Visit Number 15    Number of Visits 20    Date for PT Re-Evaluation 10/19/20    Authorization Type [employer]    Authorization Time Period 20% coinsurance, $1500 OOP max not met    PT Start Time 1145    PT Stop Time 1225    PT Time Calculation (min) 40 min    Equipment Utilized During Treatment Gait belt    Activity Tolerance Patient tolerated treatment well    Behavior During Therapy WFL for tasks assessed/performed           Past Medical History:  Diagnosis Date  . CKD (chronic kidney disease)   . History of blood transfusion   . Hyperlipidemia   . Hypertension   . Neuromuscular disorder (HCC)    numbness feet  . Peripheral vascular disease ([place])   . Shortness of breath dyspnea    occasional     Past Surgical History:  Procedure Laterality Date  . ABDOMINAL AORTOGRAM W/LOWER EXTREMITY N/A 09/13/2019   Procedure: ABDOMINAL AORTOGRAM W/LOWER EXTREMITY;  Surgeon: Waynetta Sandy, MD;  Location: [place] [facility];  Service: Cardiovascular;  Laterality: N/A;  . AMPUTATION Right 05/09/2020   Procedure: RIGHT BELOW KNEE AMPUTATION;  Surgeon: Waynetta Sandy, MD;  Location: [place];  Service: Vascular;  Laterality: Right;  w/ a block  . COLONOSCOPY WITH PROPOFOL N/A 02/15/2016   Procedure: COLONOSCOPY WITH PROPOFOL;  Surgeon: Lucilla Lame, MD;  Location: [place];  Service: Endoscopy;  Laterality: N/A;  . FEMORAL-POPLITEAL BYPASS GRAFT Right 09/14/2019   Procedure: BYPASS GRAFT FEMORAL-POPLITEAL ARTERY;  Surgeon: Waynetta Sandy, MD;  Location: [place];  Service: Vascular;   Laterality: Right;  . HERNIA REPAIR  1999   left inguinal  . INSERTION OF ILIAC STENT Right 09/14/2019   Procedure: Insertion Of Common and External Iliac Stent;  Surgeon: Waynetta Sandy, MD;  Location: [place];  Service: Vascular;  Laterality: Right;  . LOWER EXTREMITY ANGIOGRAPHY Right 05/07/2018   Procedure: LOWER EXTREMITY ANGIOGRAPHY;  Surgeon: Algernon Huxley, MD;  Location: [place] [facility];  Service: Cardiovascular;  Laterality: Right;  . LOWER EXTREMITY ANGIOGRAPHY Right 06/25/2018   Procedure: LOWER EXTREMITY ANGIOGRAPHY;  Surgeon: Algernon Huxley, MD;  Location: [place] [facility];  Service: Cardiovascular;  Laterality: Right;  . LOWER EXTREMITY ANGIOGRAPHY Right 07/01/2018   Procedure: LOWER EXTREMITY ANGIOGRAPHY;  Surgeon: Algernon Huxley, MD;  Location: [place] [facility];  Service: Cardiovascular;  Laterality: Right;  . LOWER EXTREMITY ANGIOGRAPHY Right 08/12/2018   Procedure: LOWER EXTREMITY ANGIOGRAPHY;  Surgeon: Algernon Huxley, MD;  Location: [place] [facility];  Service: Cardiovascular;  Laterality: Right;  . LOWER EXTREMITY ANGIOGRAPHY Right 09/03/2018   Procedure: LOWER EXTREMITY ANGIOGRAPHY;  Surgeon: Algernon Huxley, MD;  Location: [place] [facility];  Service: Cardiovascular;  Laterality: Right;  . LOWER EXTREMITY ANGIOGRAPHY Right 09/04/2018   Procedure: Lower Extremity Angiography;  Surgeon: Algernon Huxley, MD;  Location: [place] [facility];  Service: Cardiovascular;  Laterality: Right;  . LOWER EXTREMITY ANGIOGRAPHY Right 10/01/2018   Procedure: LOWER EXTREMITY ANGIOGRAPHY;  Surgeon: Algernon Huxley, MD;  Location: [place] [facility];  Service: Cardiovascular;  Laterality: Right;  . LOWER EXTREMITY ANGIOGRAPHY Right 10/01/2018   Procedure: Lower Extremity Angiography;  Surgeon: Algernon Huxley, MD;  Location: [place] [facility];  Service: Cardiovascular;  Laterality: Right;  . LOWER EXTREMITY ANGIOGRAPHY Right 10/15/2018   Procedure: LOWER EXTREMITY ANGIOGRAPHY;  Surgeon:  Algernon Huxley, MD;  Location: [place] [facility];  Service: Cardiovascular;  Laterality: Right;  . LOWER EXTREMITY ANGIOGRAPHY Left 10/16/2018   Procedure: Lower Extremity Angiography;  Surgeon: Algernon Huxley, MD;  Location: [place] [facility];  Service: Cardiovascular;  Laterality: Left;  . LOWER EXTREMITY ANGIOGRAPHY Left 01/20/2019   Procedure: LOWER EXTREMITY ANGIOGRAPHY;  Surgeon: Algernon Huxley, MD;  Location: [place] [facility];  Service: Cardiovascular;  Laterality: Left;  . LOWER EXTREMITY ANGIOGRAPHY Right 01/21/2019   Procedure: Lower Extremity Angiography;  Surgeon: Algernon Huxley, MD;  Location: [place] [facility];  Service: Cardiovascular;  Laterality: Right;  . LOWER EXTREMITY ANGIOGRAPHY Right 01/28/2019   Procedure: LOWER EXTREMITY ANGIOGRAPHY;  Surgeon: Algernon Huxley, MD;  Location: [place] [facility];  Service: Cardiovascular;  Laterality: Right;  . LOWER EXTREMITY ANGIOGRAPHY Right 01/29/2019   Procedure: Lower Extremity Angiography;  Surgeon: Algernon Huxley, MD;  Location: [place] [facility];  Service: Cardiovascular;  Laterality: Right;  . LOWER EXTREMITY ANGIOGRAPHY Right 04/04/2020   Procedure: LOWER EXTREMITY ANGIOGRAPHY;  Surgeon: Waynetta Sandy, MD;  Location: [place] [facility];  Service: Cardiovascular;  Laterality: Right;  . LOWER EXTREMITY INTERVENTION Right 07/02/2018   Procedure: LOWER EXTREMITY INTERVENTION;  Surgeon: Algernon Huxley, MD;  Location: [place] [facility];  Service: Cardiovascular;  Laterality: Right;  . PERIPHERAL VASCULAR BALLOON ANGIOPLASTY Left 04/04/2020   Procedure: PERIPHERAL VASCULAR BALLOON ANGIOPLASTY;  Surgeon: Waynetta Sandy, MD;  Location: [place] [facility];  Service: Cardiovascular;  Laterality: Left;  external iliac  . POLYPECTOMY N/A 02/15/2016   Procedure: POLYPECTOMY;  Surgeon: Lucilla Lame, MD;  Location: [place];  Service: Endoscopy;  Laterality: N/A;    There were no vitals filed for this visit.    Subjective Assessment - 10/05/20 1145    Subjective He has been running around community a lot as his daughter having medical issues & now in hospital.  He has not heard from [facility] yet.    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 No/denies    Pain Onset More than a month ago                             [facility] Adult PT Treatment/Exercise - 10/05/20 1145      Transfers   Transfers Sit to Stand;Stand to Sit;Floor to Transfer    Sit to Stand 6: Modified independent (Device/Increase time);With upper extremity assist;With armrests;From chair/3-in-1    Stand to Sit 6: Modified independent (Device/Increase time);With upper extremity assist;With armrests;To chair/3-in-1      Ambulation/Gait   Ambulation/Gait Yes    Ambulation/Gait Assistance 5: Supervision    Ambulation Distance (Feet) 300 Feet    Assistive device Prosthesis;None    Stairs Yes    Stairs Assistance 5: Supervision  Stairs Assistance Details (indicate cue type and reason) descending initial 7 steps alternating lead limb then last 4 steps alternating pattern with cues on right knee / TTA control.  ascending alternating 11 steps with cues on wt shift & knee control    Stair Management Technique Alternating pattern;Step to pattern;Two rails;Forwards    Number of Stairs 11    Height of Stairs 6    Ramp 5: Supervision   prosthesis only   Curb 5: Supervision   prosthesis only     Neuro Re-ed    Neuro Re-ed Details  green theraband pulling right knee into flexion if he relaxes.  Pt holding right terminal knee extension while stepping 4 directions flexion, extension, abduction & adduction returning to bilateral stance between ea direction. 5 reps. In  //bars with no touch needed.      Exercises   Exercises Knee/Hip      Knee/Hip Exercises: Stretches   Active Hamstring Stretch Right;2 reps;20 seconds    Active Hamstring Stretch Limitations supine straight leg with strap DF      Knee/Hip Exercises: Aerobic   Tread Mill BUE support 1.0 mph for 2 min PT cued with demo & verbal on safety & technique.    Nustep --      Knee/Hip Exercises: Machines for Strengthening   Cybex Leg Press shuttle BLEs 112# 20 reps 2 sets 1st set back 45* & 2nd set back flat with right stance stabilization lifting LLE off plate 2 sec      Knee/Hip Exercises: Standing   Terminal Knee Extension Strengthening;Right;3 sets;15 reps;Theraband    Theraband Level (Terminal Knee Extension) Level 3 (Green)    Terminal Knee Extension Limitations 1st set in bilateral stance, 2nd set in terminal stance, 3rd set in initial contact    Rocker Board 1 minute   ant/mid/post & right/mid/left   Rocker Board Limitations round board w/1 pivot point, BUE light support.    SLS with Vectors RLE on foam beam tapping 3 cones lateral, ant & across midline-adduction with BUE support 10 reps      Prosthetics   Prosthetic Care Comments  --    Current prosthetic wear tolerance (days/week)  daily    Current prosthetic wear tolerance (#hours/day)  most of awake hours    Current prosthetic weight-bearing tolerance (hours/day)  pt tolerated 10 minutes for standing & gait without c/o limb pain or discomfort.    Edema pitting with 5 sec capillary refill    Residual limb condition  proximal lateral area with prominant vascular graft, no open areas, scar invaginated slightly, sweaty skin, normal color & temperature, cylinderical shape    Education Provided --                    PT Short Term Goals - 09/12/20 1250      PT SHORT TERM GOAL #1   Title Patient demonstrates proper donning & verbalizes proper cleaning of prosthesis & socks.    Time 1    Period Months    Status Achieved     Target Date 09/14/20      PT SHORT TERM GOAL #2   Title Patient tolerates wear of prosthesis 12 hrs total / day with skin or limb pain issues.    Time 1    Period Months    Status Achieved    Target Date 09/14/20      PT SHORT TERM GOAL #3   Title Patient reaches 10" and picks up items from floor  without UE support safely.    Time 1    Period Months    Status Achieved    Target Date 09/14/20      PT SHORT TERM GOAL #4   Title Patient ambulates 300' with cane & prosthesis with supervision.    Time 1    Period Months    Status Achieved    Target Date 09/14/20      PT SHORT TERM GOAL #5   Title Patient negotiates ramps & curbs with cane & prosthesis with minA.    Time 1    Period Months    Status Achieved    Target Date 09/14/20             PT Long Term Goals - 09/12/20 1250      PT LONG TERM GOAL #1   Title Patient demonstrates & verbalizes understanding of prosthetic care to enable safe utilization of prosthesis.    Time 10    Period Weeks    Status On-going    Target Date 10/19/20      PT LONG TERM GOAL #2   Title Patient tolerates wear of prosthesis >90% of awake hours without skin or limb pain issues to enable function throughout his day.    Time 10    Period Weeks    Status On-going    Target Date 10/19/20      PT LONG TERM GOAL #3   Title Berg Balance  >/= 45/56 to indicate lower fall risk.    Time 10    Period Weeks    Status On-going    Target Date 10/19/20      PT LONG TERM GOAL #4   Title Patient ambulates 500' with cane or less and prosthesis modified independent to enable community mobility.    Time 10    Period Weeks    Status On-going    Target Date 10/19/20      PT LONG TERM GOAL #5   Title Patient negotiates ramps, curbs & stairs with cane or less and prosthesis modified independent.    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    Time 10    Period Weeks    Status On-going    Target Date 10/19/20                 Plan - 10/05/20 1146    Clinical Impression Statement PT introduced treadmill gait with instruction in safety & technique to facilitate fluent pattern.  Patient continues to fatigue & requires frequent rest between exercises or activities.    Personal Factors and Comorbidities Comorbidity 3+;Fitness    Comorbidities rt TTA, CKD, neuropathy, COPD w/ DOE, anemia, PAD s/p multiple revascularizations, HTN    Examination-Activity Limitations Carry;Lift;Locomotion Level;Reach Overhead;Squat;Stairs;Stand;Transfers    Examination-Participation Restrictions Community Activity;Occupation    Stability/Clinical Decision Making Evolving/Moderate complexity    Rehab Potential Good    PT Frequency 2x / week    PT Duration Other (comment)   10 weeks   PT Treatment/Interventions ADLs/Self Care Home Management;DME Instruction;Gait training;Stair training;Functional mobility training;Therapeutic activities;Therapeutic exercise;Balance training;Neuromuscular re-education;Patient/family education;Prosthetic Training;Manual techniques;Vestibular    PT Next Visit Plan prosthetic training without device including ramps & curbs,  therapeutic exercise & standing balance activities.    Consulted and Agree with Plan of  Care Patient           Patient will benefit from skilled therapeutic intervention in order to improve the following deficits and impairments:  Abnormal gait,Cardiopulmonary status limiting activity,Decreased activity tolerance,Decreased balance,Decreased knowledge of precautions,Decreased mobility,Decreased strength,Increased edema,Postural dysfunction,Prosthetic Dependency,Pain  Visit Diagnosis: Unsteadiness on feet  Muscle weakness (generalized)  Abnormal posture  Other abnormalities of gait and mobility     Problem List Patient Active Problem List    Diagnosis Date Noted  . Phantom limb pain ([place])   . Essential hypertension   . AKI (acute kidney injury) ([place])   . Acute blood loss anemia   . Postoperative pain   . Below-knee amputation of right lower extremity ([place]) 05/17/2020  . Ischemia of right lower extremity 01/28/2019  . Iron deficiency anemia 11/23/2018  . Stage 3 chronic kidney disease ([place]) 10/21/2018  . Atherosclerosis of native arteries of extremity with rest pain ([place]) 10/13/2018  . PAD (peripheral artery disease) ([place]) 07/21/2018  . Ischemic leg 06/25/2018  . Claudication ([place]) 03/17/2018  . B12 deficiency 03/12/2018  . Vitamin B1 deficiency 03/12/2018  . Varicose veins of leg with pain, right 03/12/2018  . Enlarged thoracic aorta ([place]) 02/24/2018  . Alcoholism /alcohol abuse 02/24/2018  . Paresthesia 02/24/2018  . Ectatic aorta ([place]) 02/20/2018  . Emphysema lung ([place]) 02/20/2018  . Atherosclerosis of aorta ([place]) 02/20/2018  . Encounter for tobacco use cessation counseling   . Benign neoplasm of cecum   . Benign neoplasm of transverse colon   . Benign neoplasm of sigmoid colon   . Hypertension, benign 08/31/2015  . Tobacco use 08/31/2015    Jamey Reas, PT, DPT 10/05/2020, 12:48 PM  [facility] [address] [place], [place], [address] Phone: [phone]   Fax:  [phone]  Name: RAINEN VANROSSUM MRN: [id number] Date of Birth: Feb 24, 1956

## 2020-10-10 ENCOUNTER — Ambulatory Visit (INDEPENDENT_AMBULATORY_CARE_PROVIDER_SITE_OTHER): Payer: 59 | Admitting: Physical Therapy

## 2020-10-10 ENCOUNTER — Other Ambulatory Visit: Payer: Self-pay

## 2020-10-10 ENCOUNTER — Encounter: Payer: Self-pay | Admitting: Physical Therapy

## 2020-10-10 DIAGNOSIS — R293 Abnormal posture: Secondary | ICD-10-CM

## 2020-10-10 DIAGNOSIS — R2681 Unsteadiness on feet: Secondary | ICD-10-CM | POA: Diagnosis not present

## 2020-10-10 DIAGNOSIS — R2689 Other abnormalities of gait and mobility: Secondary | ICD-10-CM | POA: Diagnosis not present

## 2020-10-10 DIAGNOSIS — M6281 Muscle weakness (generalized): Secondary | ICD-10-CM

## 2020-10-10 NOTE — Therapy (Signed)
[facility] [address] [place], [place], [address] Phone: [phone]   Fax:  [phone]  Physical Therapy Treatment  Patient Details  Name: Ryan [name] MRN: [id number] Date of Birth: 1955/08/04 Referring Provider (PT): Waynetta Sandy, MD   Encounter Date: 10/10/2020   PT End of Session - 10/10/20 1148    Visit Number 16    Number of Visits 20    Date for PT Re-Evaluation 10/19/20    Authorization Type [employer]    Authorization Time Period 20% coinsurance, $1500 OOP max not met    PT Start Time 1145    PT Stop Time 1227    PT Time Calculation (min) 42 min    Equipment Utilized During Treatment Gait belt    Activity Tolerance Patient tolerated treatment well    Behavior During Therapy WFL for tasks assessed/performed           Past Medical History:  Diagnosis Date  . CKD (chronic kidney disease)   . History of blood transfusion   . Hyperlipidemia   . Hypertension   . Neuromuscular disorder (HCC)    numbness feet  . Peripheral vascular disease ([place])   . Shortness of breath dyspnea    occasional     Past Surgical History:  Procedure Laterality Date  . ABDOMINAL AORTOGRAM W/LOWER EXTREMITY N/A 09/13/2019   Procedure: ABDOMINAL AORTOGRAM W/LOWER EXTREMITY;  Surgeon: Waynetta Sandy, MD;  Location: [place] [facility];  Service: Cardiovascular;  Laterality: N/A;  . AMPUTATION Right 05/09/2020   Procedure: RIGHT BELOW KNEE AMPUTATION;  Surgeon: Waynetta Sandy, MD;  Location: [place];  Service: Vascular;  Laterality: Right;  w/ a block  . COLONOSCOPY WITH PROPOFOL N/A 02/15/2016   Procedure: COLONOSCOPY WITH PROPOFOL;  Surgeon: Lucilla Lame, MD;  Location: [place];  Service: Endoscopy;  Laterality: N/A;  . FEMORAL-POPLITEAL BYPASS GRAFT Right 09/14/2019   Procedure: BYPASS GRAFT FEMORAL-POPLITEAL ARTERY;  Surgeon: Waynetta Sandy, MD;  Location: [place];  Service: Vascular;   Laterality: Right;  . HERNIA REPAIR  1999   left inguinal  . INSERTION OF ILIAC STENT Right 09/14/2019   Procedure: Insertion Of Common and External Iliac Stent;  Surgeon: Waynetta Sandy, MD;  Location: [place];  Service: Vascular;  Laterality: Right;  . LOWER EXTREMITY ANGIOGRAPHY Right 05/07/2018   Procedure: LOWER EXTREMITY ANGIOGRAPHY;  Surgeon: Algernon Huxley, MD;  Location: [place] [facility];  Service: Cardiovascular;  Laterality: Right;  . LOWER EXTREMITY ANGIOGRAPHY Right 06/25/2018   Procedure: LOWER EXTREMITY ANGIOGRAPHY;  Surgeon: Algernon Huxley, MD;  Location: [place] [facility];  Service: Cardiovascular;  Laterality: Right;  . LOWER EXTREMITY ANGIOGRAPHY Right 07/01/2018   Procedure: LOWER EXTREMITY ANGIOGRAPHY;  Surgeon: Algernon Huxley, MD;  Location: [place] [facility];  Service: Cardiovascular;  Laterality: Right;  . LOWER EXTREMITY ANGIOGRAPHY Right 08/12/2018   Procedure: LOWER EXTREMITY ANGIOGRAPHY;  Surgeon: Algernon Huxley, MD;  Location: [place] [facility];  Service: Cardiovascular;  Laterality: Right;  . LOWER EXTREMITY ANGIOGRAPHY Right 09/03/2018   Procedure: LOWER EXTREMITY ANGIOGRAPHY;  Surgeon: Algernon Huxley, MD;  Location: [place] [facility];  Service: Cardiovascular;  Laterality: Right;  . LOWER EXTREMITY ANGIOGRAPHY Right 09/04/2018   Procedure: Lower Extremity Angiography;  Surgeon: Algernon Huxley, MD;  Location: [place] [facility];  Service: Cardiovascular;  Laterality: Right;  . LOWER EXTREMITY ANGIOGRAPHY Right 10/01/2018   Procedure: LOWER EXTREMITY ANGIOGRAPHY;  Surgeon: Algernon Huxley, MD;  Location: [place] [facility];  Service: Cardiovascular;  Laterality: Right;  . LOWER EXTREMITY ANGIOGRAPHY Right 10/01/2018   Procedure: Lower Extremity Angiography;  Surgeon: Algernon Huxley, MD;  Location: [place] [facility];  Service: Cardiovascular;  Laterality: Right;  . LOWER EXTREMITY ANGIOGRAPHY Right 10/15/2018   Procedure: LOWER EXTREMITY ANGIOGRAPHY;  Surgeon:  Algernon Huxley, MD;  Location: [place] [facility];  Service: Cardiovascular;  Laterality: Right;  . LOWER EXTREMITY ANGIOGRAPHY Left 10/16/2018   Procedure: Lower Extremity Angiography;  Surgeon: Algernon Huxley, MD;  Location: [place] [facility];  Service: Cardiovascular;  Laterality: Left;  . LOWER EXTREMITY ANGIOGRAPHY Left 01/20/2019   Procedure: LOWER EXTREMITY ANGIOGRAPHY;  Surgeon: Algernon Huxley, MD;  Location: [place] [facility];  Service: Cardiovascular;  Laterality: Left;  . LOWER EXTREMITY ANGIOGRAPHY Right 01/21/2019   Procedure: Lower Extremity Angiography;  Surgeon: Algernon Huxley, MD;  Location: [place] [facility];  Service: Cardiovascular;  Laterality: Right;  . LOWER EXTREMITY ANGIOGRAPHY Right 01/28/2019   Procedure: LOWER EXTREMITY ANGIOGRAPHY;  Surgeon: Algernon Huxley, MD;  Location: [place] [facility];  Service: Cardiovascular;  Laterality: Right;  . LOWER EXTREMITY ANGIOGRAPHY Right 01/29/2019   Procedure: Lower Extremity Angiography;  Surgeon: Algernon Huxley, MD;  Location: [place] [facility];  Service: Cardiovascular;  Laterality: Right;  . LOWER EXTREMITY ANGIOGRAPHY Right 04/04/2020   Procedure: LOWER EXTREMITY ANGIOGRAPHY;  Surgeon: Waynetta Sandy, MD;  Location: [place] [facility];  Service: Cardiovascular;  Laterality: Right;  . LOWER EXTREMITY INTERVENTION Right 07/02/2018   Procedure: LOWER EXTREMITY INTERVENTION;  Surgeon: Algernon Huxley, MD;  Location: [place] [facility];  Service: Cardiovascular;  Laterality: Right;  . PERIPHERAL VASCULAR BALLOON ANGIOPLASTY Left 04/04/2020   Procedure: PERIPHERAL VASCULAR BALLOON ANGIOPLASTY;  Surgeon: Waynetta Sandy, MD;  Location: [place] [facility];  Service: Cardiovascular;  Laterality: Left;  external iliac  . POLYPECTOMY N/A 02/15/2016   Procedure: POLYPECTOMY;  Surgeon: Lucilla Lame, MD;  Location: [place];  Service: Endoscopy;  Laterality: N/A;    There were no vitals filed for this visit.    Subjective Assessment - 10/10/20 1145    Subjective He has no issues.    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 No/denies    Pain Onset More than a month ago                             [facility] Adult PT Treatment/Exercise - 10/10/20 1145      Transfers   Transfers Sit to Stand;Stand to Sit;Floor to Transfer    Sit to Stand 6: Modified independent (Device/Increase time);With upper extremity assist;With armrests;From chair/3-in-1    Stand to Sit 6: Modified independent (Device/Increase time);With upper extremity assist;With armrests;To chair/3-in-1      Ambulation/Gait   Ambulation/Gait Yes    Ambulation/Gait Assistance 5: Supervision    Ambulation Distance (Feet) 300 Feet    Assistive device Prosthesis;None    Stairs Yes    Stairs Assistance 5: Supervision    Stair Management Technique Alternating pattern;Step to pattern;Two rails;Forwards    Number of Stairs 11    Height of Stairs 6  Ramp 5: Supervision   prosthesis only   Curb 5: Supervision   prosthesis only     Neuro Re-ed    Neuro Re-ed Details  green theraband pulling right knee into flexion if he relaxes.  Pt holding right terminal knee extension while stepping 4 directions flexion, extension, abduction & adduction returning to bilateral stance between ea direction. 5 reps. In //bars with no touch needed.      Exercises   Exercises Knee/Hip      Knee/Hip Exercises: Stretches   Active Hamstring Stretch Right;2 reps;20 seconds    Active Hamstring Stretch Limitations supine straight leg with strap DF      Knee/Hip Exercises: Aerobic   Tread Mill BUE support 1.0 mph for 2 min PT cued with demo & verbal on safety & technique.      Knee/Hip  Exercises: Machines for Strengthening   Cybex Leg Press shuttle BLEs 112# 20 reps 2 sets 1st set back 45* & 2nd set back flat with right stance stabilization lifting LLE off plate 2 sec      Knee/Hip Exercises: Standing   Terminal Knee Extension Strengthening;Right;3 sets;15 reps;Theraband    Theraband Level (Terminal Knee Extension) Level 3 (Green)    Terminal Knee Extension Limitations 1st set in bilateral stance, 2nd set in terminal stance, 3rd set in initial contact    Rocker Board 1 minute   ant/mid/post & right/mid/left   Rocker Board Limitations round board w/1 pivot point, BUE light support.    SLS with Vectors RLE on foam beam tapping 3 cones lateral, ant & across midline-adduction with BUE support 10 reps      Prosthetics   Current prosthetic wear tolerance (days/week)  daily    Current prosthetic wear tolerance (#hours/day)  most of awake hours    Current prosthetic weight-bearing tolerance (hours/day)  pt tolerated 10 minutes for standing & gait without c/o limb pain or discomfort.    Edema pitting with 5 sec capillary refill    Residual limb condition  proximal lateral area with prominant vascular graft, no open areas, scar invaginated slightly, sweaty skin, normal color & temperature, cylinderical shape               Balance Exercises - 10/10/20 1145      Balance Exercises: Standing   Standing Eyes Opened Wide (BOA);Foam/compliant surface;5 reps;Head turns    Standing Eyes Opened Limitations head turns right/left, up/down & dialgonals    Standing Eyes Closed Wide (BOA);Foam/compliant surface;3 reps;10 secs    Standing Eyes Closed Limitations close supervision    Stepping Strategy Anterior;Posterior;5 reps    Stepping Strategy Limitations anticipatory stepping off foam beam single leg then stabilizing alternating LEs;  progressed to reactionary with PT pushing / pullling off balance. Posterior with RLE requires assist to prevent fall.               PT Short Term  Goals - 09/12/20 1250      PT SHORT TERM GOAL #1   Title Patient demonstrates proper donning & verbalizes proper cleaning of prosthesis & socks.    Time 1    Period Months    Status Achieved    Target Date 09/14/20      PT SHORT TERM GOAL #2   Title Patient tolerates wear of prosthesis 12 hrs total / day with skin or limb pain issues.    Time 1    Period Months    Status Achieved    Target Date 09/14/20  PT SHORT TERM GOAL #3   Title Patient reaches 10" and picks up items from floor without UE support safely.    Time 1    Period Months    Status Achieved    Target Date 09/14/20      PT SHORT TERM GOAL #4   Title Patient ambulates 300' with cane & prosthesis with supervision.    Time 1    Period Months    Status Achieved    Target Date 09/14/20      PT SHORT TERM GOAL #5   Title Patient negotiates ramps & curbs with cane & prosthesis with minA.    Time 1    Period Months    Status Achieved    Target Date 09/14/20             PT Long Term Goals - 09/12/20 1250      PT LONG TERM GOAL #1   Title Patient demonstrates & verbalizes understanding of prosthetic care to enable safe utilization of prosthesis.    Time 10    Period Weeks    Status On-going    Target Date 10/19/20      PT LONG TERM GOAL #2   Title Patient tolerates wear of prosthesis >90% of awake hours without skin or limb pain issues to enable function throughout his day.    Time 10    Period Weeks    Status On-going    Target Date 10/19/20      PT LONG TERM GOAL #3   Title Berg Balance  >/= 45/56 to indicate lower fall risk.    Time 10    Period Weeks    Status On-going    Target Date 10/19/20      PT LONG TERM GOAL #4   Title Patient ambulates 500' with cane or less and prosthesis modified independent to enable community mobility.    Time 10    Period Weeks    Status On-going    Target Date 10/19/20      PT LONG TERM GOAL #5   Title Patient negotiates ramps, curbs & stairs with cane  or less and prosthesis modified independent.    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    Time 10    Period Weeks    Status On-going    Target Date 10/19/20                 Plan - 10/10/20 1149    Clinical Impression Statement PT session focused on balance & functional strength.  He has improved his balance reactions & strength.  He continues to require rests between activities due to phantom pain.    Personal Factors and Comorbidities Comorbidity 3+;Fitness    Comorbidities rt TTA, CKD, neuropathy, COPD w/ DOE, anemia, PAD s/p multiple revascularizations, HTN    Examination-Activity Limitations Carry;Lift;Locomotion Level;Reach Overhead;Squat;Stairs;Stand;Transfers    Examination-Participation Restrictions Community Activity;Occupation    Stability/Clinical Decision Making Evolving/Moderate complexity    Rehab Potential Good    PT Frequency 2x / week    PT Duration Other (comment)   10 weeks   PT Treatment/Interventions ADLs/Self Care Home Management;DME Instruction;Gait training;Stair training;Functional mobility training;Therapeutic activities;Therapeutic exercise;Balance training;Neuromuscular re-education;Patient/family education;Prosthetic Training;Manual techniques;Vestibular    PT Next Visit Plan prosthetic training without device including  ramps & curbs,  therapeutic exercise & standing balance activities.    Consulted and Agree with Plan of Care Patient           Patient will benefit from skilled therapeutic intervention in order to improve the following deficits and impairments:  Abnormal gait,Cardiopulmonary status limiting activity,Decreased activity tolerance,Decreased balance,Decreased knowledge of precautions,Decreased mobility,Decreased strength,Increased edema,Postural dysfunction,Prosthetic Dependency,Pain  Visit  Diagnosis: Unsteadiness on feet  Muscle weakness (generalized)  Abnormal posture  Other abnormalities of gait and mobility     Problem List Patient Active Problem List   Diagnosis Date Noted  . Phantom limb pain ([place])   . Essential hypertension   . AKI (acute kidney injury) ([place])   . Acute blood loss anemia   . Postoperative pain   . Below-knee amputation of right lower extremity ([place]) 05/17/2020  . Ischemia of right lower extremity 01/28/2019  . Iron deficiency anemia 11/23/2018  . Stage 3 chronic kidney disease ([place]) 10/21/2018  . Atherosclerosis of native arteries of extremity with rest pain ([place]) 10/13/2018  . PAD (peripheral artery disease) ([place]) 07/21/2018  . Ischemic leg 06/25/2018  . Claudication ([place]) 03/17/2018  . B12 deficiency 03/12/2018  . Vitamin B1 deficiency 03/12/2018  . Varicose veins of leg with pain, right 03/12/2018  . Enlarged thoracic aorta ([place]) 02/24/2018  . Alcoholism /alcohol abuse 02/24/2018  . Paresthesia 02/24/2018  . Ectatic aorta ([place]) 02/20/2018  . Emphysema lung ([place]) 02/20/2018  . Atherosclerosis of aorta ([place]) 02/20/2018  . Encounter for tobacco use cessation counseling   . Benign neoplasm of cecum   . Benign neoplasm of transverse colon   . Benign neoplasm of sigmoid colon   . Hypertension, benign 08/31/2015  . Tobacco use 08/31/2015    Jamey Reas, PT, DPT 10/10/2020, 12:26 PM  [facility] [address] [place], [place], [address] Phone: [phone]   Fax:  [phone]  Name: Ryan [name] MRN: [id number] Date of Birth: 05/20/1956

## 2020-10-12 ENCOUNTER — Encounter: Payer: 59 | Admitting: Physical Therapy

## 2020-10-17 ENCOUNTER — Ambulatory Visit (INDEPENDENT_AMBULATORY_CARE_PROVIDER_SITE_OTHER): Payer: 59 | Admitting: Physical Therapy

## 2020-10-17 ENCOUNTER — Other Ambulatory Visit: Payer: Self-pay

## 2020-10-17 ENCOUNTER — Encounter: Payer: Self-pay | Admitting: Physical Therapy

## 2020-10-17 DIAGNOSIS — R2689 Other abnormalities of gait and mobility: Secondary | ICD-10-CM

## 2020-10-17 DIAGNOSIS — R293 Abnormal posture: Secondary | ICD-10-CM | POA: Diagnosis not present

## 2020-10-17 DIAGNOSIS — M6281 Muscle weakness (generalized): Secondary | ICD-10-CM | POA: Diagnosis not present

## 2020-10-17 DIAGNOSIS — R2681 Unsteadiness on feet: Secondary | ICD-10-CM | POA: Diagnosis not present

## 2020-10-17 NOTE — Progress Notes (Addendum)
Name: Ryan [name]   MRN: [id number]    DOB: 1956-01-19   Date:10/18/2020       Progress Note  Subjective  Chief Complaint  Follow up   HPI   PAD:  Mr. Elvin has a long history of PAD with claudication, he used to see Dr. Lucky Cowboy but asked for a second opinion . He was sent to Dr. 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 He is doing well. He finished PT< now using prosthesis and ambulating well. He has a follow up today with vascular surgeon   MDD: he is doing well now, taking Duloxetine, he is doing well since had BKA and prosthesis. No side effects  Phantom pain: controlled with duloxetine and gabapentin , only has pain intermittent when more active.   Left shoulder pain: going on for months, however getting worse sine BKA , he uses walker and upper body for transfer. He states since PT left shoulder is well, but now having right shoulder pain, advised to resume home PT with right side   Insomnia: he is now taking Trazodone 75 mg at night and states sleep is good with higher dose, he has been sleeping better and taking medication prn now   HTN: No chest pain, dizziness  or palpitation . BP today is at goal, he states at home it is usually around 130's   Atherosclerosis of Aorta Aneurysma of ascending aorta :he is taking aspirin, and Plavix, off Eliquis, denies side effects of medication He also has ectasia of the ascending thoracic aorta, measuring 3.9 cm, and repeat CT done 03/16/2020 was 4.1 cm . Discussed follow up with vascular surgeon and needs yearly checks - he has follow up with surgeon today and if not ordered by him we will order when he returns for follow up  Alcoholism: he has not been drinking since hospital admission 05/09/2020, he is taking B1 and B12 deficiency. He is taking pantoprazole daily , no heart burn or indigestion, we  will decrease dose to 20 mg and monitor   Skin nodule: noticed years ago , growing a little but slowly, no pain, localized on epigastric area, no redness, discussed US soft tissue but he would like to hold off for now   Emphysema: he is on Trelegy  he states no wheezing, but has intermittent sob with activity but stable, he has a cough usually at night  . He quit smoking 12/2018 , reviewed recent CT done , he has emphysemea  CKI : stage III, discussed importance of avoiding nsaid's   Tinnitus: noticed a long time ago, sounds crickets on both sides, however now it is constant and more bothersome. NO pain , vertigo but seems to have noticed some decrease in hearing on right side   Patient Active Problem List   Diagnosis Date Noted  . Phantom limb pain ([place])   . Essential hypertension   . AKI (acute kidney injury) ([place])   . Acute blood loss anemia   . Postoperative pain   . Below-knee amputation of right lower extremity ([place]) 05/17/2020  . Ischemia of right lower extremity 01/28/2019  . Iron deficiency anemia 11/23/2018  . Stage 3 chronic kidney disease ([place]) 10/21/2018  . Atherosclerosis of native arteries of extremity with rest pain ([place]) 10/13/2018  . PAD (peripheral artery disease) ([place]) 07/21/2018  . Ischemic leg  06/25/2018  . Claudication ([place]) 03/17/2018  . B12 deficiency 03/12/2018  . Vitamin B1 deficiency 03/12/2018  . Varicose veins of leg with pain, right 03/12/2018  . Enlarged thoracic aorta ([place]) 02/24/2018  . Alcoholism /alcohol abuse 02/24/2018  . Paresthesia 02/24/2018  . Ectatic aorta ([place]) 02/20/2018  . Emphysema lung ([place]) 02/20/2018  . Atherosclerosis of aorta ([place]) 02/20/2018  . Encounter for tobacco use cessation counseling   . Benign neoplasm of cecum   . Benign neoplasm of transverse colon   . Benign neoplasm of sigmoid colon   . Hypertension, benign 08/31/2015  . Tobacco use 08/31/2015    Past Surgical History:  Procedure Laterality Date  .  ABDOMINAL AORTOGRAM W/LOWER EXTREMITY N/A 09/13/2019   Procedure: ABDOMINAL AORTOGRAM W/LOWER EXTREMITY;  Surgeon: Waynetta Sandy, MD;  Location: [facility];  Service: Cardiovascular;  Laterality: N/A;  . AMPUTATION Right 05/09/2020   Procedure: RIGHT BELOW KNEE AMPUTATION;  Surgeon: Waynetta Sandy, MD;  Location: [facility];  Service: Vascular;  Laterality: Right;  w/ a block  . COLONOSCOPY WITH PROPOFOL N/A 02/15/2016   Procedure: COLONOSCOPY WITH PROPOFOL;  Surgeon: Lucilla Lame, MD;  Location: [facility];  Service: Endoscopy;  Laterality: N/A;  . FEMORAL-POPLITEAL BYPASS GRAFT Right 09/14/2019   Procedure: BYPASS GRAFT FEMORAL-POPLITEAL ARTERY;  Surgeon: Waynetta Sandy, MD;  Location: [facility];  Service: Vascular;  Laterality: Right;  . HERNIA REPAIR  1999   left inguinal  . INSERTION OF ILIAC STENT Right 09/14/2019   Procedure: Insertion Of Common and External Iliac Stent;  Surgeon: Waynetta Sandy, MD;  Location: [facility];  Service: Vascular;  Laterality: Right;  . LOWER EXTREMITY ANGIOGRAPHY Right 05/07/2018   Procedure: LOWER EXTREMITY ANGIOGRAPHY;  Surgeon: Algernon Huxley, MD;  Location: [facility];  Service: Cardiovascular;  Laterality: Right;  . LOWER EXTREMITY ANGIOGRAPHY Right 06/25/2018   Procedure: LOWER EXTREMITY ANGIOGRAPHY;  Surgeon: Algernon Huxley, MD;  Location: [facility];  Service: Cardiovascular;  Laterality: Right;  . LOWER EXTREMITY ANGIOGRAPHY Right 07/01/2018   Procedure: LOWER EXTREMITY ANGIOGRAPHY;  Surgeon: Algernon Huxley, MD;  Location: [facility];  Service: Cardiovascular;  Laterality: Right;  . LOWER EXTREMITY ANGIOGRAPHY Right 08/12/2018   Procedure: LOWER EXTREMITY ANGIOGRAPHY;  Surgeon: Algernon Huxley, MD;  Location: [facility];  Service: Cardiovascular;  Laterality: Right;  . LOWER EXTREMITY ANGIOGRAPHY Right 09/03/2018   Procedure: LOWER EXTREMITY ANGIOGRAPHY;  Surgeon: Algernon Huxley, MD;   Location: [facility];  Service: Cardiovascular;  Laterality: Right;  . LOWER EXTREMITY ANGIOGRAPHY Right 09/04/2018   Procedure: Lower Extremity Angiography;  Surgeon: Algernon Huxley, MD;  Location: [facility];  Service: Cardiovascular;  Laterality: Right;  . LOWER EXTREMITY ANGIOGRAPHY Right 10/01/2018   Procedure: LOWER EXTREMITY ANGIOGRAPHY;  Surgeon: Algernon Huxley, MD;  Location: [facility];  Service: Cardiovascular;  Laterality: Right;  . LOWER EXTREMITY ANGIOGRAPHY Right 10/01/2018   Procedure: Lower Extremity Angiography;  Surgeon: Algernon Huxley, MD;  Location: [facility];  Service: Cardiovascular;  Laterality: Right;  . LOWER EXTREMITY ANGIOGRAPHY Right 10/15/2018   Procedure: LOWER EXTREMITY ANGIOGRAPHY;  Surgeon: Algernon Huxley, MD;  Location: [facility];  Service: Cardiovascular;  Laterality: Right;  . LOWER EXTREMITY ANGIOGRAPHY Left 10/16/2018   Procedure: Lower Extremity Angiography;  Surgeon: Algernon Huxley, MD;  Location: [facility];  Service: Cardiovascular;  Laterality: Left;  . LOWER EXTREMITY ANGIOGRAPHY  Left 01/20/2019   Procedure: LOWER EXTREMITY ANGIOGRAPHY;  Surgeon: Algernon Huxley, MD;  Location: [facility];  Service: Cardiovascular;  Laterality: Left;  . LOWER EXTREMITY ANGIOGRAPHY Right 01/21/2019   Procedure: Lower Extremity Angiography;  Surgeon: Algernon Huxley, MD;  Location: [facility];  Service: Cardiovascular;  Laterality: Right;  . LOWER EXTREMITY ANGIOGRAPHY Right 01/28/2019   Procedure: LOWER EXTREMITY ANGIOGRAPHY;  Surgeon: Algernon Huxley, MD;  Location: [facility];  Service: Cardiovascular;  Laterality: Right;  . LOWER EXTREMITY ANGIOGRAPHY Right 01/29/2019   Procedure: Lower Extremity Angiography;  Surgeon: Algernon Huxley, MD;  Location: [facility];  Service: Cardiovascular;  Laterality: Right;  . LOWER EXTREMITY ANGIOGRAPHY Right 04/04/2020   Procedure: LOWER EXTREMITY ANGIOGRAPHY;   Surgeon: Waynetta Sandy, MD;  Location: [facility];  Service: Cardiovascular;  Laterality: Right;  . LOWER EXTREMITY INTERVENTION Right 07/02/2018   Procedure: LOWER EXTREMITY INTERVENTION;  Surgeon: Algernon Huxley, MD;  Location: [facility];  Service: Cardiovascular;  Laterality: Right;  . PERIPHERAL VASCULAR BALLOON ANGIOPLASTY Left 04/04/2020   Procedure: PERIPHERAL VASCULAR BALLOON ANGIOPLASTY;  Surgeon: Waynetta Sandy, MD;  Location: [facility];  Service: Cardiovascular;  Laterality: Left;  external iliac  . POLYPECTOMY N/A 02/15/2016   Procedure: POLYPECTOMY;  Surgeon: Lucilla Lame, MD;  Location: [facility];  Service: Endoscopy;  Laterality: N/A;    Family History  Problem Relation Age of Onset  . COPD Mother   . Hypertension Father   . Heart attack Brother     Social History   Tobacco Use  . Smoking status: Former Smoker    Packs/day: 0.50    Years: 40.00    Pack years: 20.00    Types: Cigarettes    Start date: 07/21/1978    Quit date: 12/2018    Years since quitting: 1.8  . Smokeless tobacco: Never Used  Substance Use Topics  . Alcohol use: Not Currently    Alcohol/week: 12.0 standard drinks    Types: 12 Cans of beer per week    Comment: 4 beers / week now per pt 05/05/20     Current Outpatient Medications:  .  albuterol (VENTOLIN HFA) 108 (90 Base) MCG/ACT inhaler, Inhale 2 puffs into the lungs every 6 (six) hours as needed for wheezing or shortness of breath., Disp: 18 g, Rfl: 0 .  amLODipine-valsartan (EXFORGE) 5-160 MG tablet, Take 1 tablet by mouth once daily, Disp: 90 tablet, Rfl: 0 .  ascorbic acid (VITAMIN C) 500 MG tablet, Take 1 tablet (500 mg total) by mouth daily., Disp: 30 tablet, Rfl: 0 .  aspirin EC 81 MG tablet, Take 1 tablet (81 mg total) by mouth daily., Disp: 90 tablet, Rfl: 3 .  atorvastatin (LIPITOR) 20 MG tablet, Take 1 tablet by mouth once daily, Disp: 90 tablet, Rfl: 0 .  Cholecalciferol  (DIALYVITE VITAMIN D 5000) 125 MCG (5000 UT) capsule, Take 5,000 Units by mouth daily., Disp: , Rfl:  .  clopidogrel (PLAVIX) 75 MG tablet, Take 1 tablet by mouth once daily, Disp: 30 tablet, Rfl: 0 .  Cyanocobalamin (B-12) 500 MCG SUBL, Place 1 tablet under the tongue daily. (Patient taking differently: Place 500 mcg under the tongue daily.), Disp: 30 tablet, Rfl: 0 .  DULoxetine (CYMBALTA) 60 MG capsule, Take 1 capsule (60 mg total) by mouth daily., Disp: 90 capsule, Rfl: 1 .  Fluticasone-Umeclidin-Vilant (TRELEGY ELLIPTA) 100-62.5-25 MCG/INH AEPB, Inhale 1 puff into the lungs daily  as needed (shortness of breath)., Disp: 60 each, Rfl: 5 .  gabapentin (NEURONTIN) 100 MG capsule, Take 1 capsule (100 mg total) by mouth 3 (three) times daily., Disp: 270 capsule, Rfl: 0 .  Multiple Vitamin (MULTIVITAMIN WITH MINERALS) TABS tablet, Take 1 tablet by mouth daily., Disp: , Rfl:  .  pantoprazole (PROTONIX) 40 MG tablet, Take 1 tablet by mouth once daily, Disp: 30 tablet, Rfl: 0 .  thiamine (VITAMIN B-1) 50 MG tablet, Take 1 tablet (50 mg total) by mouth daily., Disp: 30 tablet, Rfl: 2 .  traZODone (DESYREL) 50 MG tablet, Take 1.5 tablets (75 mg total) by mouth at bedtime., Disp: 135 tablet, Rfl: 0  No Known Allergies  I personally reviewed active problem list, medication list, allergies, family history, social history, health maintenance with the patient/caregiver today.   ROS  Constitutional: Negative for fever or weight change.  Respiratory: Negative for cough and shortness of breath.   Cardiovascular: Negative for chest pain or palpitations.  Gastrointestinal: Negative for abdominal pain, no bowel changes.  Musculoskeletal: Negative for gait problem or joint swelling.  Skin: Negative for rash.  Neurological: Negative for dizziness or headache.  No other specific complaints in a complete review of systems (except as listed in HPI above).  Objective  Vitals:   10/18/20 0918  BP: 118/74   Pulse: 87  Resp: 16  Temp: 97.8 F (36.6 C)  TempSrc: Oral  SpO2: 99%  Weight: 154 lb (69.9 kg)  Height: 5\' 7"  (1.702 m)    Body mass index is 24.12 kg/m.  Physical Exam  Constitutional: Patient appears well-developed and well-nourished.  No distress.  HEENT: head atraumatic, normocephalic, pupils equal and reactive to light,  neck supple, cerumen on both ear canals Cardiovascular: Normal rate, regular rhythm and normal heart sounds.  No murmur heard. No BLE edema. Pulmonary/Chest: Effort normal , rhonchi bilaterally . No respiratory distress. Muscular skeletal: doing well with prosthesis  Skin: subcutaneous nodule on epigastric area, likely cyst  Abdominal: Soft.  There is no tenderness. Psychiatric: Patient has a normal mood and affect. behavior is normal. Judgment and thought content normal.  PHQ2/9: Depression screen [facility] 2/9 10/18/2020 07/13/2020 06/05/2020 05/29/2020 03/14/2020  Decreased Interest 0 0 0 0 0  Down, Depressed, Hopeless 0 0 0 0 0  PHQ - 2 Score 0 0 0 0 0  Altered sleeping - - 1 - -  Tired, decreased energy - - 0 - -  Change in appetite - - 0 - -  Feeling bad or failure about yourself  - - 0 - -  Trouble concentrating - - 0 - -  Moving slowly or fidgety/restless - - 0 - -  Suicidal thoughts - - 0 - -  PHQ-9 Score - - 1 - -  Difficult doing work/chores - - - - -  Some recent data might be hidden    phq 9 is negative   Fall Risk: Fall Risk  10/18/2020 07/13/2020 06/05/2020 03/14/2020 11/12/2019  Falls in the past year? 0 0 0 0 0  Number falls in past yr: 0 0 0 0 0  Injury with Fall? 0 0 0 0 0  Follow up - - - - Falls evaluation completed    Functional Status Survey: Is the patient deaf or have difficulty hearing?: Yes Does the patient have difficulty seeing, even when wearing glasses/contacts?: No Does the patient have difficulty concentrating, remembering, or making decisions?: No Does the patient have difficulty walking or climbing stairs?: No Does  the patient have difficulty dressing or bathing?: No Does the patient have difficulty doing errands alone such as visiting a doctor's office or shopping?: No    Assessment & Plan  1. PAD (peripheral artery disease) (HCC)  - atorvastatin (LIPITOR) 20 MG tablet; Take 1 tablet (20 mg total) by mouth daily.  Dispense: 90 tablet; Refill: 1 - clopidogrel (PLAVIX) 75 MG tablet; Take 1 tablet (75 mg total) by mouth daily.  Dispense: 90 tablet; Refill: 1  2. History of amputation below knee, right ([place])  Doing well   3. Atherosclerosis of aorta (HCC)  - atorvastatin (LIPITOR) 20 MG tablet; Take 1 tablet (20 mg total) by mouth daily.  Dispense: 90 tablet; Refill: 1  4. Phantom pain after amputation of lower extremity (HCC)  - DULoxetine (CYMBALTA) 60 MG capsule; Take 1 capsule (60 mg total) by mouth daily.  Dispense: 90 capsule; Refill: 1 - gabapentin (NEURONTIN) 100 MG capsule; Take 1 capsule (100 mg total) by mouth 3 (three) times daily.  Dispense: 270 capsule; Refill: 1  5. Hypertension, benign  - amLODipine-valsartan (EXFORGE) 5-160 MG tablet; Take 1 tablet by mouth daily.  Dispense: 90 tablet; Refill: 1  6. Pulmonary emphysema, unspecified emphysema type ([place])  - Fluticasone-Umeclidin-Vilant (TRELEGY ELLIPTA) 100-62.5-25 MCG/INH AEPB; Inhale 1 puff into the lungs daily as needed (shortness of breath).  Dispense: 60 each; Refill: 5 - albuterol (VENTOLIN HFA) 108 (90 Base) MCG/ACT inhaler; Inhale 2 puffs into the lungs every 6 (six) hours as needed for wheezing or shortness of breath.  Dispense: 18 g; Refill: 0  7. B12 deficiency  Advised to take supplements a few times a week instead of daily   8. Stage 3a chronic kidney disease ([place])  Recheck labs next visit   9. Benign hypertension with chronic kidney disease, stage III (HCC)  - amLODipine-valsartan (EXFORGE) 5-160 MG tablet; Take 1 tablet by mouth daily.  Dispense: 90 tablet; Refill: 1  10. Enlarged thoracic aorta  ([place])   11. Vitamin B1 deficiency   12. Thrombocytosis   13. Other insomnia   14. History of alcoholism ([place])  - pantoprazole (PROTONIX) 20 MG tablet; Take 1 tablet (20 mg total) by mouth daily.  Dispense: 90 tablet; Refill: 0  15. Tinnitus, bilateral  - Ambulatory referral to ENT

## 2020-10-17 NOTE — Therapy (Signed)
[facility] [address] [place], [place], [address] Phone: [phone]   Fax:  [phone]  Physical Therapy Treatment & Discharge Summary  Patient Details  Name: Ryan [name] MRN: [id number] Date of Birth: 09/09/1955 Referring Provider (PT): Brandon Christopher Cain, MD   Encounter Date: 10/17/2020   PHYSICAL THERAPY DISCHARGE SUMMARY  Visits from Start of Care: 17  Current functional level related to goals / functional outcomes: See below   Remaining deficits: See below   Education / Equipment: Prosthetic Care & HEP / fitness plan  Plan: Patient agrees to discharge.  Patient goals were met. Patient is being discharged due to meeting the stated rehab goals.  ?????          PT End of Session - 10/17/20 1145    Visit Number 17    Number of Visits 20    Date for PT Re-Evaluation 10/19/20    Authorization Type [employer]    Authorization Time Period 20% coinsurance, $1500 OOP max not met    PT Start Time 1145    PT Stop Time 1223    PT Time Calculation (min) 38 min    Equipment Utilized During Treatment Gait belt    Activity Tolerance Patient tolerated treatment well    Behavior During Therapy WFL for tasks assessed/performed           Past Medical History:  Diagnosis Date  . CKD (chronic kidney disease)   . History of blood transfusion   . Hyperlipidemia   . Hypertension   . Neuromuscular disorder (HCC)    numbness feet  . Peripheral vascular disease (HCC)   . Shortness of breath dyspnea    occasional     Past Surgical History:  Procedure Laterality Date  . ABDOMINAL AORTOGRAM W/LOWER EXTREMITY N/A 09/13/2019   Procedure: ABDOMINAL AORTOGRAM W/LOWER EXTREMITY;  Surgeon: Cain, Brandon Christopher, MD;  Location: [facility];  Service: Cardiovascular;  Laterality: N/A;  . AMPUTATION Right 05/09/2020   Procedure: RIGHT BELOW KNEE AMPUTATION;  Surgeon: Cain, Brandon Christopher, MD;  Location: [facility];   Service: Vascular;  Laterality: Right;  w/ a block  . COLONOSCOPY WITH PROPOFOL N/A 02/15/2016   Procedure: COLONOSCOPY WITH PROPOFOL;  Surgeon: Darren Wohl, MD;  Location: [facility];  Service: Endoscopy;  Laterality: N/A;  . FEMORAL-POPLITEAL BYPASS GRAFT Right 09/14/2019   Procedure: BYPASS GRAFT FEMORAL-POPLITEAL ARTERY;  Surgeon: Cain, Brandon Christopher, MD;  Location: [facility];  Service: Vascular;  Laterality: Right;  . HERNIA REPAIR  1999   left inguinal  . INSERTION OF ILIAC STENT Right 09/14/2019   Procedure: Insertion Of Common and External Iliac Stent;  Surgeon: Cain, Brandon Christopher, MD;  Location: [facility];  Service: Vascular;  Laterality: Right;  . LOWER EXTREMITY ANGIOGRAPHY Right 05/07/2018   Procedure: LOWER EXTREMITY ANGIOGRAPHY;  Surgeon: Dew, Jason S, MD;  Location: [facility];  Service: Cardiovascular;  Laterality: Right;  . LOWER EXTREMITY ANGIOGRAPHY Right 06/25/2018   Procedure: LOWER EXTREMITY ANGIOGRAPHY;  Surgeon: Dew, Jason S, MD;  Location: [facility];  Service: Cardiovascular;  Laterality: Right;  . LOWER EXTREMITY ANGIOGRAPHY Right 07/01/2018   Procedure: LOWER EXTREMITY ANGIOGRAPHY;  Surgeon: Dew, Jason S, MD;  Location: [facility];  Service: Cardiovascular;  Laterality: Right;  . LOWER EXTREMITY ANGIOGRAPHY Right 08/12/2018   Procedure: LOWER EXTREMITY ANGIOGRAPHY;  Surgeon: Dew, Jason S, MD;  Location: [facility];  Service: Cardiovascular;  Laterality: Right;  . LOWER EXTREMITY ANGIOGRAPHY   Right 09/03/2018   Procedure: LOWER EXTREMITY ANGIOGRAPHY;  Surgeon: Dew, Jason S, MD;  Location: [facility];  Service: Cardiovascular;  Laterality: Right;  . LOWER EXTREMITY ANGIOGRAPHY Right 09/04/2018   Procedure: Lower Extremity Angiography;  Surgeon: Dew, Jason S, MD;  Location: [facility];  Service: Cardiovascular;  Laterality: Right;  . LOWER EXTREMITY ANGIOGRAPHY Right 10/01/2018   Procedure: LOWER EXTREMITY  ANGIOGRAPHY;  Surgeon: Dew, Jason S, MD;  Location: [facility];  Service: Cardiovascular;  Laterality: Right;  . LOWER EXTREMITY ANGIOGRAPHY Right 10/01/2018   Procedure: Lower Extremity Angiography;  Surgeon: Dew, Jason S, MD;  Location: [facility];  Service: Cardiovascular;  Laterality: Right;  . LOWER EXTREMITY ANGIOGRAPHY Right 10/15/2018   Procedure: LOWER EXTREMITY ANGIOGRAPHY;  Surgeon: Dew, Jason S, MD;  Location: [facility];  Service: Cardiovascular;  Laterality: Right;  . LOWER EXTREMITY ANGIOGRAPHY Left 10/16/2018   Procedure: Lower Extremity Angiography;  Surgeon: Dew, Jason S, MD;  Location: [facility];  Service: Cardiovascular;  Laterality: Left;  . LOWER EXTREMITY ANGIOGRAPHY Left 01/20/2019   Procedure: LOWER EXTREMITY ANGIOGRAPHY;  Surgeon: Dew, Jason S, MD;  Location: [facility];  Service: Cardiovascular;  Laterality: Left;  . LOWER EXTREMITY ANGIOGRAPHY Right 01/21/2019   Procedure: Lower Extremity Angiography;  Surgeon: Dew, Jason S, MD;  Location: [facility];  Service: Cardiovascular;  Laterality: Right;  . LOWER EXTREMITY ANGIOGRAPHY Right 01/28/2019   Procedure: LOWER EXTREMITY ANGIOGRAPHY;  Surgeon: Dew, Jason S, MD;  Location: [facility];  Service: Cardiovascular;  Laterality: Right;  . LOWER EXTREMITY ANGIOGRAPHY Right 01/29/2019   Procedure: Lower Extremity Angiography;  Surgeon: Dew, Jason S, MD;  Location: [facility];  Service: Cardiovascular;  Laterality: Right;  . LOWER EXTREMITY ANGIOGRAPHY Right 04/04/2020   Procedure: LOWER EXTREMITY ANGIOGRAPHY;  Surgeon: Cain, Brandon Christopher, MD;  Location: [facility];  Service: Cardiovascular;  Laterality: Right;  . LOWER EXTREMITY INTERVENTION Right 07/02/2018   Procedure: LOWER EXTREMITY INTERVENTION;  Surgeon: Dew, Jason S, MD;  Location: [facility];  Service: Cardiovascular;  Laterality: Right;  . PERIPHERAL VASCULAR BALLOON  ANGIOPLASTY Left 04/04/2020   Procedure: PERIPHERAL VASCULAR BALLOON ANGIOPLASTY;  Surgeon: Cain, Brandon Christopher, MD;  Location: [facility];  Service: Cardiovascular;  Laterality: Left;  external iliac  . POLYPECTOMY N/A 02/15/2016   Procedure: POLYPECTOMY;  Surgeon: Darren Wohl, MD;  Location: [facility];  Service: Endoscopy;  Laterality: N/A;    There were no vitals filed for this visit.   Subjective Assessment - 10/17/20 1150    Subjective He is seeing prosthetist tomorrow.  He thinks the prosthesis is too long. He also has an appt with [facility].    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 No/denies    Pain Onset More than a month ago              [facility] PT Assessment - 10/17/20 1145      Assessment   Medical Diagnosis RIght Transtibial Amputaion    Referring Provider (PT) Brandon Christopher Cain, MD    Onset Date/Surgical Date 08/07/20      Transfers   Sit to   Stand 7: Independent;Without upper extremity assist;From chair/3-in-1    Stand to Sit 7: Independent;To chair/3-in-1;Without upper extremity assist      Ambulation/Gait   Ambulation/Gait Yes    Ambulation/Gait Assistance 6: Modified independent (Device/Increase time)    Ambulation Distance (Feet) 500 Feet    Assistive device Prosthesis;None    Stairs Yes    Stairs Assistance 6: Modified independent (Device/Increase time)    Stair Management Technique One rail Right;Alternating pattern;Forwards    Number of Stairs 11    Height of Stairs 6    Ramp 6: Modified independent (Device)   prosthesis only   Curb 6: Modified independent (Device/increase time)   prosthesis only     Berg Balance Test   Sit to Stand Able to stand without using hands  and stabilize independently    Standing Unsupported Able to stand safely 2 minutes    Sitting with Back Unsupported but Feet Supported on Floor or Stool Able to sit safely and securely 2 minutes    Stand to Sit Sits safely with minimal use of hands    Transfers Able to transfer safely, minor use of hands    Standing Unsupported with Eyes Closed Able to stand 10 seconds safely    Standing Unsupported with Feet Together Able to place feet together independently and stand 1 minute safely    From Standing, Reach Forward with Outstretched Arm Can reach confidently >25 cm (10")    From Standing Position, Pick up Object from Floor Able to pick up shoe safely and easily    From Standing Position, Turn to Look Behind Over each Shoulder Looks behind from both sides and weight shifts well    Turn 360 Degrees Able to turn 360 degrees safely in 4 seconds or less    Standing Unsupported, Alternately Place Feet on Step/Stool Able to stand independently and safely and complete 8 steps in 20 seconds    Standing Unsupported, One Foot in Front Able to plae foot ahead of the other independently and hold 30 seconds    Standing on One Leg Able to lift leg independently and hold 5-10 seconds    Total Score 54    Berg comment: on 08/14/20 was 27/56 & on 09/12/2020 was 42/56      Dynamic Gait Index   Level Surface Mild Impairment    Change in Gait Speed Mild Impairment    Gait with Horizontal Head Turns Normal    Gait with Vertical Head Turns Normal    Gait and Pivot Turn Normal    Step Over Obstacle Mild Impairment    Step Around Obstacles Normal    Steps Mild Impairment    Total Score 20    DGI comment: on 09/12/2020 was 8/24 with cane.  today 20/24 with no device except prosthesis      Timed Up and Go Test   Normal TUG (seconds) 10.92   on 3/22 was 13.04   Cognitive TUG (seconds) 13.53   on 09/12/2020 was 18.33sec          Prosthetics Assessment - 10/17/20 1145      Prosthetics   Prosthetic Care  Independent with Skin check;Residual limb care;Care of non-amputated limb;Prosthetic cleaning;Ply sock cleaning;Correct ply sock adjustment;Proper wear schedule/adjustment;Proper weight-bearing schedule/adjustment    Prosthetic Care Comments  prosthesis appears 1/2" too short. Pt to see prosthetist tomorrow.    Donning prosthesis  Modified independent (Device/Increase time)    Doffing prosthesis  Independent    Current prosthetic wear tolerance (days/week)    daily    Current prosthetic wear tolerance (#hours/day)  most of awake hours    Current prosthetic weight-bearing tolerance (hours/day)  pt tolerated 10 minutes for standing & gait without c/o limb pain or discomfort.    Edema pitting    Residual limb condition  proximal lateral area with prominant vascular graft, no open areas, scar invaginated slightly, sweaty skin, normal color & temperature, cylinderical shape    Prosthesis Description silicon liner with shuttle pin lock, total contact socket, dynamic response foot                        [facility] Adult PT Treatment/Exercise - 10/17/20 1145      Therapeutic Activites    Therapeutic Activities Lifting    Lifting Pt able to lift & carry 20# safely.                    PT Short Term Goals - 09/12/20 1250      PT SHORT TERM GOAL #1   Title Patient demonstrates proper donning & verbalizes proper cleaning of prosthesis & socks.    Time 1    Period Months    Status Achieved    Target Date 09/14/20      PT SHORT TERM GOAL #2   Title Patient tolerates wear of prosthesis 12 hrs total / day with skin or limb pain issues.    Time 1    Period Months    Status Achieved    Target Date 09/14/20      PT SHORT TERM GOAL #3   Title Patient reaches 10" and picks up items from floor without UE support safely.    Time 1    Period Months    Status Achieved    Target Date 09/14/20      PT SHORT TERM GOAL #4   Title Patient ambulates 300' with cane & prosthesis with  supervision.    Time 1    Period Months    Status Achieved    Target Date 09/14/20      PT SHORT TERM GOAL #5   Title Patient negotiates ramps & curbs with cane & prosthesis with minA.    Time 1    Period Months    Status Achieved    Target Date 09/14/20             PT Long Term Goals - 10/17/20 1258      PT LONG TERM GOAL #1   Title Patient demonstrates & verbalizes understanding of prosthetic care to enable safe utilization of prosthesis.    Time 10    Period Weeks    Status Achieved      PT LONG TERM GOAL #2   Title Patient tolerates wear of prosthesis >90% of awake hours without skin or limb pain issues to enable function throughout his day.    Time 10    Period Weeks    Status Achieved      PT LONG TERM GOAL #3   Title Berg Balance  >/= 45/56 to indicate lower fall risk.    Time 10    Period Weeks    Status Achieved      PT LONG TERM GOAL #4   Title Patient ambulates 500' with cane or less and prosthesis modified independent to enable community mobility.    Time 10    Period Weeks    Status Achieved      PT LONG TERM GOAL #  5   Title Patient negotiates ramps, curbs & stairs with cane or less and prosthesis modified independent.    Time 10    Period Weeks    Status Achieved      PT LONG TERM GOAL #6   Title Patient performs work simulation tasks including lifting & carrying 20# safely.    Time 10    Period Weeks    Status Achieved                 Plan - 10/17/20 1150    Clinical Impression Statement Patient met all LTGs. He appears to be functioning at community level with prosthesis only. He has lower fall risk with Berg Balance score, Timed Up & Go times and Dynamic Gait Index score.  He is working with Vocational Rehabilitation for return to work.    Personal Factors and Comorbidities Comorbidity 3+;Fitness    Comorbidities rt TTA, CKD, neuropathy, COPD w/ DOE, anemia, PAD s/p multiple revascularizations, HTN    Examination-Activity  Limitations Carry;Lift;Locomotion Level;Reach Overhead;Squat;Stairs;Stand;Transfers    Examination-Participation Restrictions Community Activity;Occupation    Stability/Clinical Decision Making Evolving/Moderate complexity    Rehab Potential Good    PT Frequency 2x / week    PT Duration Other (comment)   10 weeks   PT Treatment/Interventions ADLs/Self Care Home Management;DME Instruction;Gait training;Stair training;Functional mobility training;Therapeutic activities;Therapeutic exercise;Balance training;Neuromuscular re-education;Patient/family education;Prosthetic Training;Manual techniques;Vestibular    PT Next Visit Plan discharge PT    Consulted and Agree with Plan of Care Patient           Patient will benefit from skilled therapeutic intervention in order to improve the following deficits and impairments:  Abnormal gait,Cardiopulmonary status limiting activity,Decreased activity tolerance,Decreased balance,Decreased knowledge of precautions,Decreased mobility,Decreased strength,Increased edema,Postural dysfunction,Prosthetic Dependency,Pain  Visit Diagnosis: Muscle weakness (generalized)  Unsteadiness on feet  Abnormal posture  Other abnormalities of gait and mobility     Problem List Patient Active Problem List   Diagnosis Date Noted  . Phantom limb pain (HCC)   . Essential hypertension   . AKI (acute kidney injury) (HCC)   . Acute blood loss anemia   . Postoperative pain   . Below-knee amputation of right lower extremity (HCC) 05/17/2020  . Ischemia of right lower extremity 01/28/2019  . Iron deficiency anemia 11/23/2018  . Stage 3 chronic kidney disease (HCC) 10/21/2018  . Atherosclerosis of native arteries of extremity with rest pain (HCC) 10/13/2018  . PAD (peripheral artery disease) (HCC) 07/21/2018  . Ischemic leg 06/25/2018  . Claudication (HCC) 03/17/2018  . B12 deficiency 03/12/2018  . Vitamin B1 deficiency 03/12/2018  . Varicose veins of leg with pain,  right 03/12/2018  . Enlarged thoracic aorta (HCC) 02/24/2018  . Alcoholism /alcohol abuse 02/24/2018  . Paresthesia 02/24/2018  . Ectatic aorta (HCC) 02/20/2018  . Emphysema lung (HCC) 02/20/2018  . Atherosclerosis of aorta (HCC) 02/20/2018  . Encounter for tobacco use cessation counseling   . Benign neoplasm of cecum   . Benign neoplasm of transverse colon   . Benign neoplasm of sigmoid colon   . Hypertension, benign 08/31/2015  . Tobacco use 08/31/2015    Robin Waldron, PT, DPT 10/17/2020, 2:50 PM  [facility] [address] [place], [place], [address] Phone: [phone]   Fax:  [phone]  Name: Noelle M Nisley MRN: [id number] Date of Birth: 06/30/1955   

## 2020-10-18 ENCOUNTER — Encounter: Payer: Self-pay | Admitting: Family Medicine

## 2020-10-18 ENCOUNTER — Encounter: Payer: 59 | Admitting: Physical Therapy

## 2020-10-18 ENCOUNTER — Ambulatory Visit (INDEPENDENT_AMBULATORY_CARE_PROVIDER_SITE_OTHER): Payer: 59 | Admitting: Family Medicine

## 2020-10-18 VITALS — BP 118/74 | HR 87 | Temp 97.8°F | Resp 16 | Ht 67.0 in | Wt 154.0 lb

## 2020-10-18 DIAGNOSIS — G546 Phantom limb syndrome with pain: Secondary | ICD-10-CM | POA: Diagnosis not present

## 2020-10-18 DIAGNOSIS — E538 Deficiency of other specified B group vitamins: Secondary | ICD-10-CM

## 2020-10-18 DIAGNOSIS — D75839 Thrombocytosis, unspecified: Secondary | ICD-10-CM

## 2020-10-18 DIAGNOSIS — Z89511 Acquired absence of right leg below knee: Secondary | ICD-10-CM | POA: Diagnosis not present

## 2020-10-18 DIAGNOSIS — N183 Chronic kidney disease, stage 3 unspecified: Secondary | ICD-10-CM

## 2020-10-18 DIAGNOSIS — I10 Essential (primary) hypertension: Secondary | ICD-10-CM

## 2020-10-18 DIAGNOSIS — N1831 Chronic kidney disease, stage 3a: Secondary | ICD-10-CM

## 2020-10-18 DIAGNOSIS — J439 Emphysema, unspecified: Secondary | ICD-10-CM

## 2020-10-18 DIAGNOSIS — I129 Hypertensive chronic kidney disease with stage 1 through stage 4 chronic kidney disease, or unspecified chronic kidney disease: Secondary | ICD-10-CM

## 2020-10-18 DIAGNOSIS — F1021 Alcohol dependence, in remission: Secondary | ICD-10-CM

## 2020-10-18 DIAGNOSIS — E519 Thiamine deficiency, unspecified: Secondary | ICD-10-CM

## 2020-10-18 DIAGNOSIS — I739 Peripheral vascular disease, unspecified: Secondary | ICD-10-CM

## 2020-10-18 DIAGNOSIS — H9313 Tinnitus, bilateral: Secondary | ICD-10-CM

## 2020-10-18 DIAGNOSIS — G4709 Other insomnia: Secondary | ICD-10-CM

## 2020-10-18 DIAGNOSIS — I700 Atherosclerosis of aorta: Secondary | ICD-10-CM | POA: Diagnosis not present

## 2020-10-18 DIAGNOSIS — I7789 Other specified disorders of arteries and arterioles: Secondary | ICD-10-CM

## 2020-10-18 MED ORDER — TRELEGY ELLIPTA 100-62.5-25 MCG/INH IN AEPB
1.0000 | INHALATION_SPRAY | Freq: Every day | RESPIRATORY_TRACT | 5 refills | Status: DC | PRN
Start: 2020-10-18 — End: 2021-10-22

## 2020-10-18 MED ORDER — PANTOPRAZOLE SODIUM 20 MG PO TBEC: 20.0000 mg | DELAYED_RELEASE_TABLET | Freq: Every day | ORAL | 0 refills | Status: DC

## 2020-10-18 MED ORDER — AMLODIPINE BESYLATE-VALSARTAN 5-160 MG PO TABS: 1.0000 | ORAL_TABLET | Freq: Every day | ORAL | 1 refills | Status: DC

## 2020-10-18 MED ORDER — ALBUTEROL SULFATE HFA 108 (90 BASE) MCG/ACT IN AERS: 2.0000 | INHALATION_SPRAY | Freq: Four times a day (QID) | RESPIRATORY_TRACT | 0 refills | Status: DC | PRN

## 2020-10-18 MED ORDER — DULOXETINE HCL 60 MG PO CPEP: 60.0000 mg | ORAL_CAPSULE | Freq: Every day | ORAL | 1 refills | Status: DC

## 2020-10-18 MED ORDER — GABAPENTIN 100 MG PO CAPS: 100.0000 mg | ORAL_CAPSULE | Freq: Three times a day (TID) | ORAL | 1 refills | Status: DC

## 2020-10-18 MED ORDER — ATORVASTATIN CALCIUM 20 MG PO TABS: 1.0000 | ORAL_TABLET | Freq: Every day | ORAL | 1 refills | Status: DC

## 2020-10-18 MED ORDER — CLOPIDOGREL BISULFATE 75 MG PO TABS: 1.0000 | ORAL_TABLET | Freq: Every day | ORAL | 1 refills | Status: DC

## 2020-10-24 ENCOUNTER — Encounter: Payer: 59 | Admitting: Physical Therapy

## 2020-10-26 ENCOUNTER — Encounter: Payer: 59 | Admitting: Physical Therapy

## 2020-10-30 ENCOUNTER — Other Ambulatory Visit: Payer: Self-pay | Admitting: Family Medicine

## 2020-10-30 DIAGNOSIS — I739 Peripheral vascular disease, unspecified: Secondary | ICD-10-CM

## 2020-11-29 ENCOUNTER — Other Ambulatory Visit: Payer: Self-pay | Admitting: Family Medicine

## 2020-11-29 DIAGNOSIS — G546 Phantom limb syndrome with pain: Secondary | ICD-10-CM

## 2021-01-16 NOTE — Progress Notes (Signed)
Name: Ryan [name]   MRN: [id number]    DOB: 1956/03/13   Date:01/17/2021       Progress Note  Subjective  Chief Complaint  Follow Up  HPI  PAD:  Mr. Well has a long history of PAD with claudication, he used to see Dr. Lucky Cowboy but asked for a second opinion . He was sent to Dr. 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 He graduated from PT, walking without assistance - wearing prosthesis Taking statin therapy, no longer drinking   Dysthymia : he is doing well now, taking Duloxetine, he is doing well since had BKA and prosthesis. Duloxetine also helps with pain, we will continue medication   Phantom pain: controlled with duloxetine and gabapentin , symptoms are stable, the pain is mostly on ankle area, also states usually taking gabapentin twice daily, explained can take one in am and two in pm to see if helps with sleep    Insomnia: he is  doing well , usually only takes one pill of trazodone and is able to sleep, occasionally takes one and a half.   HTN:  No chest pain  or palpitation . BP today is at goal, he states not checking it at home lately, towards low end of normal today and getting a little dizzy when standing in am's advised to monitor at home and we may adjust dose on his next visit    Atherosclerosis of Aorta Aneurysma of ascending aorta : he is taking aspirin, and Plavix, off Eliquis, denies side effects of medication He also has ectasia of the ascending thoracic aorta, measuring 3.9 cm, and repeat CT done 03/16/2020 was 4.1 cm . Discussed follow up with vascular surgeon and needs yearly checks - he has follow up with surgeon today and if not ordered by him we will order when he returns for follow up   Alcoholism: he has not been drinking since hospital admission 05/09/2020, he is taking B1 and B12 deficiency. He is taking pantoprazole  daily , no heart burn or indigestion, he is on lower dose of PPI and doing well    Emphysema: he is on Trelegy  he states no wheezing, but has intermittent sob with activity but stable, he has a cough usually at night  . He quit smoking 12/2018 , reviewed recent CT done , he has emphysema, symptoms are controlled    CKI : stage III, discussed importance of avoiding nsaid's , we will recheck labs today   Tinnitus: noticed a long time ago, sounds crickets on both sides, however now it is constant and more bothersome. No pain , vertigo but seems to have noticed some decrease in hearing on right side , he has been wearing father's old hearing aid.   Patient Active Problem List   Diagnosis Date Noted   Phantom limb pain ([place])    Essential hypertension    AKI (acute kidney injury) ([place])    Acute blood loss anemia    Postoperative pain    Below-knee amputation of right lower extremity ([place]) 05/17/2020   Ischemia of right lower extremity 01/28/2019   Iron deficiency anemia 11/23/2018   Stage 3 chronic kidney disease ([place]) 10/21/2018   Atherosclerosis of native arteries of extremity with rest pain ([place]) 10/13/2018   PAD (peripheral artery disease) ([place]) 07/21/2018   Ischemic leg 06/25/2018  Claudication ([place]) 03/17/2018   B12 deficiency 03/12/2018   Vitamin B1 deficiency 03/12/2018   Varicose veins of leg with pain, right 03/12/2018   Enlarged thoracic aorta ([place]) 02/24/2018   Alcoholism /alcohol abuse 02/24/2018   Paresthesia 02/24/2018   Ectatic aorta ([place]) 02/20/2018   Emphysema lung ([place]) 02/20/2018   Atherosclerosis of aorta ([place]) 02/20/2018   Encounter for tobacco use cessation counseling    Benign neoplasm of cecum    Benign neoplasm of transverse colon    Benign neoplasm of sigmoid colon    Hypertension, benign 08/31/2015   Tobacco use 08/31/2015    Past Surgical History:  Procedure Laterality Date   ABDOMINAL AORTOGRAM W/LOWER EXTREMITY N/A 09/13/2019   Procedure: ABDOMINAL  AORTOGRAM W/LOWER EXTREMITY;  Surgeon: Waynetta Sandy, MD;  Location: [facility];  Service: Cardiovascular;  Laterality: N/A;   AMPUTATION Right 05/09/2020   Procedure: RIGHT BELOW KNEE AMPUTATION;  Surgeon: Waynetta Sandy, MD;  Location: [facility];  Service: Vascular;  Laterality: Right;  w/ a block   COLONOSCOPY WITH PROPOFOL N/A 02/15/2016   Procedure: COLONOSCOPY WITH PROPOFOL;  Surgeon: Lucilla Lame, MD;  Location: [facility];  Service: Endoscopy;  Laterality: N/A;   FEMORAL-POPLITEAL BYPASS GRAFT Right 09/14/2019   Procedure: BYPASS GRAFT FEMORAL-POPLITEAL ARTERY;  Surgeon: Waynetta Sandy, MD;  Location: [facility];  Service: Vascular;  Laterality: Right;   HERNIA REPAIR  1999   left inguinal   INSERTION OF ILIAC STENT Right 09/14/2019   Procedure: Insertion Of Common and External Iliac Stent;  Surgeon: Waynetta Sandy, MD;  Location: [facility];  Service: Vascular;  Laterality: Right;   LOWER EXTREMITY ANGIOGRAPHY Right 05/07/2018   Procedure: LOWER EXTREMITY ANGIOGRAPHY;  Surgeon: Algernon Huxley, MD;  Location: [facility];  Service: Cardiovascular;  Laterality: Right;   LOWER EXTREMITY ANGIOGRAPHY Right 06/25/2018   Procedure: LOWER EXTREMITY ANGIOGRAPHY;  Surgeon: Algernon Huxley, MD;  Location: [facility];  Service: Cardiovascular;  Laterality: Right;   LOWER EXTREMITY ANGIOGRAPHY Right 07/01/2018   Procedure: LOWER EXTREMITY ANGIOGRAPHY;  Surgeon: Algernon Huxley, MD;  Location: [facility];  Service: Cardiovascular;  Laterality: Right;   LOWER EXTREMITY ANGIOGRAPHY Right 08/12/2018   Procedure: LOWER EXTREMITY ANGIOGRAPHY;  Surgeon: Algernon Huxley, MD;  Location: [facility];  Service: Cardiovascular;  Laterality: Right;   LOWER EXTREMITY ANGIOGRAPHY Right 09/03/2018   Procedure: LOWER EXTREMITY ANGIOGRAPHY;  Surgeon: Algernon Huxley, MD;  Location: [facility];  Service: Cardiovascular;  Laterality: Right;   LOWER  EXTREMITY ANGIOGRAPHY Right 09/04/2018   Procedure: Lower Extremity Angiography;  Surgeon: Algernon Huxley, MD;  Location: [facility];  Service: Cardiovascular;  Laterality: Right;   LOWER EXTREMITY ANGIOGRAPHY Right 10/01/2018   Procedure: LOWER EXTREMITY ANGIOGRAPHY;  Surgeon: Algernon Huxley, MD;  Location: [facility];  Service: Cardiovascular;  Laterality: Right;   LOWER EXTREMITY ANGIOGRAPHY Right 10/01/2018   Procedure: Lower Extremity Angiography;  Surgeon: Algernon Huxley, MD;  Location: [facility];  Service: Cardiovascular;  Laterality: Right;   LOWER EXTREMITY ANGIOGRAPHY Right 10/15/2018   Procedure: LOWER EXTREMITY ANGIOGRAPHY;  Surgeon: Algernon Huxley, MD;  Location: [facility];  Service: Cardiovascular;  Laterality: Right;   LOWER EXTREMITY ANGIOGRAPHY Left 10/16/2018   Procedure: Lower Extremity Angiography;  Surgeon: Algernon Huxley, MD;  Location: [facility];  Service: Cardiovascular;  Laterality: Left;   LOWER EXTREMITY ANGIOGRAPHY Left 01/20/2019  Procedure: LOWER EXTREMITY ANGIOGRAPHY;  Surgeon: Algernon Huxley, MD;  Location: [place] [facility];  Service: Cardiovascular;  Laterality: Left;   LOWER EXTREMITY ANGIOGRAPHY Right 01/21/2019   Procedure: Lower Extremity Angiography;  Surgeon: Algernon Huxley, MD;  Location: [facility];  Service: Cardiovascular;  Laterality: Right;   LOWER EXTREMITY ANGIOGRAPHY Right 01/28/2019   Procedure: LOWER EXTREMITY ANGIOGRAPHY;  Surgeon: Algernon Huxley, MD;  Location: [place] [facility] [facility];  Service: Cardiovascular;  Laterality: Right;   LOWER EXTREMITY ANGIOGRAPHY Right 01/29/2019   Procedure: Lower Extremity Angiography;  Surgeon: Algernon Huxley, MD;  Location: [facility];  Service: Cardiovascular;  Laterality: Right;   LOWER EXTREMITY ANGIOGRAPHY Right 04/04/2020   Procedure: LOWER EXTREMITY ANGIOGRAPHY;  Surgeon: Waynetta Sandy, MD;  Location: [facility];  Service: Cardiovascular;   Laterality: Right;   LOWER EXTREMITY INTERVENTION Right 07/02/2018   Procedure: LOWER EXTREMITY INTERVENTION;  Surgeon: Algernon Huxley, MD;  Location: [facility];  Service: Cardiovascular;  Laterality: Right;   PERIPHERAL VASCULAR BALLOON ANGIOPLASTY Left 04/04/2020   Procedure: PERIPHERAL VASCULAR BALLOON ANGIOPLASTY;  Surgeon: Waynetta Sandy, MD;  Location: [facility];  Service: Cardiovascular;  Laterality: Left;  external iliac   POLYPECTOMY N/A 02/15/2016   Procedure: POLYPECTOMY;  Surgeon: Lucilla Lame, MD;  Location: [place];  Service: Endoscopy;  Laterality: N/A;    Family History  Problem Relation Age of Onset   COPD Mother    Hypertension Father    Heart attack Brother     Social History   Tobacco Use   Smoking status: Former    Packs/day: 0.50    Years: 40.00    Pack years: 20.00    Types: Cigarettes    Start date: 07/21/1978    Quit date: 12/2018    Years since quitting: 2.0   Smokeless tobacco: Never  Substance Use Topics   Alcohol use: Not Currently    Alcohol/week: 12.0 standard drinks    Types: 12 Cans of beer per week    Comment: 4 beers / week now per pt 05/05/20     Current Outpatient Medications:    albuterol (VENTOLIN HFA) 108 (90 Base) MCG/ACT inhaler, Inhale 2 puffs into the lungs every 6 (six) hours as needed for wheezing or shortness of breath., Disp: 18 g, Rfl: 0   amLODipine-valsartan (EXFORGE) 5-160 MG tablet, Take 1 tablet by mouth daily., Disp: 90 tablet, Rfl: 1   ascorbic acid (VITAMIN C) 500 MG tablet, Take 1 tablet (500 mg total) by mouth daily., Disp: 30 tablet, Rfl: 0   aspirin EC 81 MG tablet, Take 1 tablet (81 mg total) by mouth daily., Disp: 90 tablet, Rfl: 3   atorvastatin (LIPITOR) 20 MG tablet, Take 1 tablet (20 mg total) by mouth daily., Disp: 90 tablet, Rfl: 1   Cholecalciferol (DIALYVITE VITAMIN D 5000) 125 MCG (5000 UT) capsule, Take 5,000 Units by mouth daily., Disp: , Rfl:    clopidogrel  (PLAVIX) 75 MG tablet, Take 1 tablet (75 mg total) by mouth daily., Disp: 90 tablet, Rfl: 1   Cyanocobalamin (B-12) 500 MCG SUBL, Place 1 tablet under the tongue daily. (Patient taking differently: Place 500 mcg under the tongue daily.), Disp: 30 tablet, Rfl: 0   DULoxetine (CYMBALTA) 60 MG capsule, Take 1 capsule (60 mg total) by mouth daily., Disp: 90 capsule, Rfl: 1   Fluticasone-Umeclidin-Vilant (TRELEGY ELLIPTA) 100-62.5-25 MCG/INH AEPB, Inhale 1 puff into the lungs daily as needed (shortness  of breath)., Disp: 60 each, Rfl: 5   gabapentin (NEURONTIN) 100 MG capsule, Take 1 capsule (100 mg total) by mouth 3 (three) times daily., Disp: 270 capsule, Rfl: 1   Multiple Vitamin (MULTIVITAMIN WITH MINERALS) TABS tablet, Take 1 tablet by mouth daily., Disp: , Rfl:    pantoprazole (PROTONIX) 20 MG tablet, Take 1 tablet (20 mg total) by mouth daily., Disp: 90 tablet, Rfl: 0   thiamine (VITAMIN B-1) 50 MG tablet, Take 1 tablet (50 mg total) by mouth daily., Disp: 30 tablet, Rfl: 2   traZODone (DESYREL) 50 MG tablet, Take 1.5 tablets (75 mg total) by mouth at bedtime., Disp: 135 tablet, Rfl: 0  No Known Allergies  I personally reviewed active problem list, medication list, allergies, family history, social history with the patient/caregiver today.   ROS  Constitutional: Negative for fever or weight change.  Respiratory: Negative for cough and shortness of breath.   Cardiovascular: Negative for chest pain or palpitations.  Gastrointestinal: Negative for abdominal pain, no bowel changes.  Musculoskeletal: Negative for gait problem or joint swelling.  Skin: Negative for rash.  Neurological: Negative for dizziness or headache.  No other specific complaints in a complete review of systems (except as listed in HPI above).   Objective  Vitals:   01/17/21 0949  BP: 118/72  Pulse: 96  Resp: 16  Temp: 98 F (36.7 C)  SpO2: 97%  Weight: 155 lb (70.3 kg)  Height: '5\' 7"'$  (1.702 m)    Body mass  index is 24.28 kg/m.  Physical Exam  Constitutional: Patient appears well-developed and well-nourished. Overweight.  No distress.  HEENT: head atraumatic, normocephalic, pupils equal and reactive to light, neck supple Cardiovascular: Normal rate, regular rhythm and normal heart sounds.  No murmur heard. No BLE edema. Pulmonary/Chest: Effort normal and breath sounds normal. No respiratory distress. Abdominal: Soft.  There is no tenderness. Psychiatric: Patient has a normal mood and affect. behavior is normal. Judgment and thought content normal.  Muscular Skeletal; right below knee amputation   PHQ2/9: Depression screen [facility] 2/9 01/17/2021 10/18/2020 07/13/2020 06/05/2020 05/29/2020  Decreased Interest 0 0 0 0 0  Down, Depressed, Hopeless 0 0 0 0 0  PHQ - 2 Score 0 0 0 0 0  Altered sleeping - - - 1 -  Tired, decreased energy - - - 0 -  Change in appetite - - - 0 -  Feeling bad or failure about yourself  - - - 0 -  Trouble concentrating - - - 0 -  Moving slowly or fidgety/restless - - - 0 -  Suicidal thoughts - - - 0 -  PHQ-9 Score - - - 1 -  Difficult doing work/chores - - - - -  Some recent data might be hidden    phq 9 is negative  Fall Risk: Fall Risk  01/17/2021 10/18/2020 07/13/2020 06/05/2020 03/14/2020  Falls in the past year? 0 0 0 0 0  Number falls in past yr: 0 0 0 0 0  Injury with Fall? 0 0 0 0 0  Follow up - - - - -     Functional Status Survey: Is the patient deaf or have difficulty hearing?: No Does the patient have difficulty seeing, even when wearing glasses/contacts?: No Does the patient have difficulty concentrating, remembering, or making decisions?: No Does the patient have difficulty walking or climbing stairs?: No Does the patient have difficulty dressing or bathing?: No Does the patient have difficulty doing errands alone such as visiting a doctor's  office or shopping?: No    1. PAD (peripheral artery disease) (HCC)  - Lipid panel  2. History of  amputation below knee, right ([place])  Doing well , tolerating prosthesis well and walking without assistance   3. Atherosclerosis of aorta ([facility])  - Lipid panel  4. Pulmonary emphysema, unspecified emphysema type ([place])   5. Hypertension, benign   6. Phantom pain after amputation of lower extremity ([place])   7. B12 deficiency  - Vitamin B12 - CBC with Differential/Platelet  8. Stage 3a chronic kidney disease (H[facility])  - COMPLETE METABOLIC PANEL WITH GFR - Microalbumin / creatinine urine ratio  9. Benign hypertension with chronic kidney disease, stage III ([facility])  We will recheck labs today   10. Vitamin B1 deficiency  - Vitamin B1  11. History of alcoholism ([place])   12. Enlarged thoracic aorta ([place])  On statin therapy   13. Other insomnia  - traZODone (DESYREL) 50 MG tablet; Take 1.5 tablets (75 mg total) by mouth at bedtime.  Dispense: 135 tablet; Refill: 0  14. History of iron deficiency  - CBC with Differential/Platelet - Iron, TIBC and Ferritin Panel  15. Hyperglycemia  - Hemoglobin A1c  16. Need for shingles vaccine  - Varicella-zoster vaccine IM  17. Need for Tdap vaccination  - Tdap vaccine greater than or equal to 7yo IM

## 2021-01-17 ENCOUNTER — Encounter: Payer: Self-pay | Admitting: Family Medicine

## 2021-01-17 ENCOUNTER — Ambulatory Visit (INDEPENDENT_AMBULATORY_CARE_PROVIDER_SITE_OTHER): Payer: 59 | Admitting: Family Medicine

## 2021-01-17 ENCOUNTER — Other Ambulatory Visit: Payer: Self-pay

## 2021-01-17 VITALS — BP 118/72 | HR 96 | Temp 98.0°F | Resp 16 | Ht 67.0 in | Wt 155.0 lb

## 2021-01-17 DIAGNOSIS — Z89511 Acquired absence of right leg below knee: Secondary | ICD-10-CM | POA: Diagnosis not present

## 2021-01-17 DIAGNOSIS — R739 Hyperglycemia, unspecified: Secondary | ICD-10-CM

## 2021-01-17 DIAGNOSIS — E519 Thiamine deficiency, unspecified: Secondary | ICD-10-CM

## 2021-01-17 DIAGNOSIS — G546 Phantom limb syndrome with pain: Secondary | ICD-10-CM

## 2021-01-17 DIAGNOSIS — I739 Peripheral vascular disease, unspecified: Secondary | ICD-10-CM | POA: Diagnosis not present

## 2021-01-17 DIAGNOSIS — J439 Emphysema, unspecified: Secondary | ICD-10-CM | POA: Diagnosis not present

## 2021-01-17 DIAGNOSIS — E538 Deficiency of other specified B group vitamins: Secondary | ICD-10-CM

## 2021-01-17 DIAGNOSIS — Z23 Encounter for immunization: Secondary | ICD-10-CM | POA: Diagnosis not present

## 2021-01-17 DIAGNOSIS — N1831 Chronic kidney disease, stage 3a: Secondary | ICD-10-CM

## 2021-01-17 DIAGNOSIS — I700 Atherosclerosis of aorta: Secondary | ICD-10-CM | POA: Diagnosis not present

## 2021-01-17 DIAGNOSIS — F1021 Alcohol dependence, in remission: Secondary | ICD-10-CM

## 2021-01-17 DIAGNOSIS — G4709 Other insomnia: Secondary | ICD-10-CM

## 2021-01-17 DIAGNOSIS — I10 Essential (primary) hypertension: Secondary | ICD-10-CM

## 2021-01-17 DIAGNOSIS — N183 Chronic kidney disease, stage 3 unspecified: Secondary | ICD-10-CM

## 2021-01-17 DIAGNOSIS — I129 Hypertensive chronic kidney disease with stage 1 through stage 4 chronic kidney disease, or unspecified chronic kidney disease: Secondary | ICD-10-CM

## 2021-01-17 DIAGNOSIS — I7789 Other specified disorders of arteries and arterioles: Secondary | ICD-10-CM

## 2021-01-17 DIAGNOSIS — Z8639 Personal history of other endocrine, nutritional and metabolic disease: Secondary | ICD-10-CM

## 2021-01-17 MED ORDER — TRAZODONE HCL 50 MG PO TABS: 75.0000 mg | ORAL_TABLET | Freq: Every day | ORAL | 0 refills | Status: DC

## 2021-01-24 LAB — CBC WITH DIFFERENTIAL/PLATELET
Absolute Monocytes: 302 cells/uL (ref 200–950)
Basophils Absolute: 32 cells/uL (ref 0–200)
Basophils Relative: 0.7 %
Eosinophils Absolute: 72 cells/uL (ref 15–500)
Eosinophils Relative: 1.6 %
HCT: 37.4 % — ABNORMAL LOW (ref 38.5–50.0)
Hemoglobin: 12.6 g/dL — ABNORMAL LOW (ref 13.2–17.1)
Lymphs Abs: 1049 cells/uL (ref 850–3900)
MCH: 30.4 pg (ref 27.0–33.0)
MCHC: 33.7 g/dL (ref 32.0–36.0)
MCV: 90.1 fL (ref 80.0–100.0)
MPV: 10.4 fL (ref 7.5–12.5)
Monocytes Relative: 6.7 %
Neutro Abs: 3047 cells/uL (ref 1500–7800)
Neutrophils Relative %: 67.7 %
Platelets: 274 10*3/uL (ref 140–400)
RBC: 4.15 10*6/uL — ABNORMAL LOW (ref 4.20–5.80)
RDW: 12.4 % (ref 11.0–15.0)
Total Lymphocyte: 23.3 %
WBC: 4.5 10*3/uL (ref 3.8–10.8)

## 2021-01-24 LAB — VITAMIN B12: Vitamin B-12: >2000 pg/mL — ABNORMAL HIGH (ref 200–1100)

## 2021-01-24 LAB — COMPLETE METABOLIC PANEL WITH GFR
AG Ratio: 1.5 (calc) (ref 1.0–2.5)
ALT: 15 U/L (ref 9–46)
AST: 17 U/L (ref 10–35)
Albumin: 4.5 g/dL (ref 3.6–5.1)
Alkaline phosphatase (APISO): 86 U/L (ref 35–144)
BUN/Creatinine Ratio: 13 (calc) (ref 6–22)
BUN: 21 mg/dL (ref 7–25)
CO2: 26 mmol/L (ref 20–32)
Calcium: 9.2 mg/dL (ref 8.6–10.3)
Chloride: 106 mmol/L (ref 98–110)
Creat: 1.57 mg/dL — ABNORMAL HIGH (ref 0.70–1.35)
Globulin: 3 g/dL (calc) (ref 1.9–3.7)
Glucose, Bld: 82 mg/dL (ref 65–99)
Potassium: 4.4 mmol/L (ref 3.5–5.3)
Sodium: 140 mmol/L (ref 135–146)
Total Bilirubin: 0.4 mg/dL (ref 0.2–1.2)
Total Protein: 7.5 g/dL (ref 6.1–8.1)
eGFR: 49 mL/min/{1.73_m2} — ABNORMAL LOW (ref >=60)

## 2021-01-24 LAB — LIPID PANEL
Cholesterol: 132 mg/dL (ref <200)
HDL: 46 mg/dL (ref >=40)
LDL Cholesterol (Calc): 72 mg/dL (calc)
Non-HDL Cholesterol (Calc): 86 mg/dL (calc) (ref <130)
Total CHOL/HDL Ratio: 2.9 (calc) (ref <5.0)
Triglycerides: 59 mg/dL (ref <150)

## 2021-01-24 LAB — HEMOGLOBIN A1C
Hgb A1c MFr Bld: 5 % of total Hgb (ref <5.7)
Mean Plasma Glucose: 97 mg/dL
eAG (mmol/L): 5.4 mmol/L

## 2021-01-24 LAB — VITAMIN B1: Vitamin B1 (Thiamine): 112 nmol/L — ABNORMAL HIGH (ref 8–30)

## 2021-01-24 LAB — IRON,TIBC AND FERRITIN PANEL
%SAT: 15 % (calc) — ABNORMAL LOW (ref 20–48)
Ferritin: 13 ng/mL — ABNORMAL LOW (ref 24–380)
Iron: 46 ug/dL — ABNORMAL LOW (ref 50–180)
TIBC: 301 mcg/dL (calc) (ref 250–425)

## 2021-01-24 LAB — MICROALBUMIN / CREATININE URINE RATIO
Creatinine, Urine: 114 mg/dL (ref 20–320)
Microalb Creat Ratio: 151 mcg/mg creat — ABNORMAL HIGH (ref <30)
Microalb, Ur: 17.2 mg/dL

## 2021-01-25 ENCOUNTER — Other Ambulatory Visit: Payer: Self-pay | Admitting: Family Medicine

## 2021-02-05 ENCOUNTER — Other Ambulatory Visit: Payer: Self-pay | Admitting: Family Medicine

## 2021-02-05 DIAGNOSIS — F1021 Alcohol dependence, in remission: Secondary | ICD-10-CM

## 2021-03-28 ENCOUNTER — Encounter (HOSPITAL_COMMUNITY): Payer: 59

## 2021-03-28 ENCOUNTER — Ambulatory Visit: Payer: 59 | Admitting: Vascular Surgery

## 2021-04-04 ENCOUNTER — Ambulatory Visit (HOSPITAL_COMMUNITY)
Admission: RE | Admit: 2021-04-04 | Discharge: 2021-04-04 | Disposition: A | Discharge status: Home / Self Care | Payer: 59 | Source: Ambulatory Visit | Attending: Vascular Surgery | Admitting: Vascular Surgery

## 2021-04-04 ENCOUNTER — Encounter: Payer: Self-pay | Admitting: Vascular Surgery

## 2021-04-04 ENCOUNTER — Ambulatory Visit (INDEPENDENT_AMBULATORY_CARE_PROVIDER_SITE_OTHER): Payer: 59 | Admitting: Vascular Surgery

## 2021-04-04 ENCOUNTER — Ambulatory Visit (INDEPENDENT_AMBULATORY_CARE_PROVIDER_SITE_OTHER)
Admission: RE | Admit: 2021-04-04 | Discharge: 2021-04-04 | Disposition: A | Discharge status: Home / Self Care | Payer: 59 | Source: Ambulatory Visit | Attending: Vascular Surgery | Admitting: Vascular Surgery

## 2021-04-04 ENCOUNTER — Other Ambulatory Visit: Payer: Self-pay

## 2021-04-04 VITALS — BP 134/85 | HR 62 | Temp 97.8°F | Resp 20 | Ht 67.0 in | Wt 152.0 lb

## 2021-04-04 DIAGNOSIS — I739 Peripheral vascular disease, unspecified: Secondary | ICD-10-CM

## 2021-04-04 NOTE — Progress Notes (Signed)
Patient ID: Ryan [name], male   DOB: 1956/06/15, 65 y.o.   MRN: [id number]  Reason for Consult: Follow-up   Referred by Steele Sizer, MD  Subjective:     HPI:  Ryan [name] is a 65 y.o. male history of right below-knee amputation.  We also placed a left external iliac artery stent at last angiogram 1 year ago.  Patient had quit smoking he is smoking somewhat now.  He does have pain in his right below-knee amputation site he wears extra socks as this also feels cool to him.  He can still feel his right calf muscles consistent with phantom pain.  His left lower extremity has no wounds no ulceration at this time.  He continues on aspirin, Plavix and a statin  Past Medical History:  Diagnosis Date   CKD (chronic kidney disease)    History of blood transfusion    Hyperlipidemia    Hypertension    Neuromuscular disorder (HCC)    numbness feet   Peripheral vascular disease (HCC)    Shortness of breath dyspnea    occasional    Family History  Problem Relation Age of Onset   COPD Mother    Hypertension Father    Heart attack Brother    Past Surgical History:  Procedure Laterality Date   ABDOMINAL AORTOGRAM W/LOWER EXTREMITY N/A 09/13/2019   Procedure: ABDOMINAL AORTOGRAM W/LOWER EXTREMITY;  Surgeon: Waynetta Sandy, MD;  Location: [facility];  Service: Cardiovascular;  Laterality: N/A;   AMPUTATION Right 05/09/2020   Procedure: RIGHT BELOW KNEE AMPUTATION;  Surgeon: Waynetta Sandy, MD;  Location: [place];  Service: Vascular;  Laterality: Right;  w/ a block   COLONOSCOPY WITH PROPOFOL N/A 02/15/2016   Procedure: COLONOSCOPY WITH PROPOFOL;  Surgeon: Lucilla Lame, MD;  Location: [place];  Service: Endoscopy;  Laterality: N/A;   FEMORAL-POPLITEAL BYPASS GRAFT Right 09/14/2019   Procedure: BYPASS GRAFT FEMORAL-POPLITEAL ARTERY;  Surgeon: Waynetta Sandy, MD;  Location: [place];  Service: Vascular;  Laterality: Right;   HERNIA REPAIR   1999   left inguinal   INSERTION OF ILIAC STENT Right 09/14/2019   Procedure: Insertion Of Common and External Iliac Stent;  Surgeon: Waynetta Sandy, MD;  Location: [place];  Service: Vascular;  Laterality: Right;   LOWER EXTREMITY ANGIOGRAPHY Right 05/07/2018   Procedure: LOWER EXTREMITY ANGIOGRAPHY;  Surgeon: Algernon Huxley, MD;  Location: [facility];  Service: Cardiovascular;  Laterality: Right;   LOWER EXTREMITY ANGIOGRAPHY Right 06/25/2018   Procedure: LOWER EXTREMITY ANGIOGRAPHY;  Surgeon: Algernon Huxley, MD;  Location: [place] [facility];  Service: Cardiovascular;  Laterality: Right;   LOWER EXTREMITY ANGIOGRAPHY Right 07/01/2018   Procedure: LOWER EXTREMITY ANGIOGRAPHY;  Surgeon: Algernon Huxley, MD;  Location: [place] [facility];  Service: Cardiovascular;  Laterality: Right;   LOWER EXTREMITY ANGIOGRAPHY Right 08/12/2018   Procedure: LOWER EXTREMITY ANGIOGRAPHY;  Surgeon: Algernon Huxley, MD;  Location: [place] [place] [facility];  Service: Cardiovascular;  Laterality: Right;   LOWER EXTREMITY ANGIOGRAPHY Right 09/03/2018   Procedure: LOWER EXTREMITY ANGIOGRAPHY;  Surgeon: Algernon Huxley, MD;  Location: [place] [facility];  Service: Cardiovascular;  Laterality: Right;   LOWER EXTREMITY ANGIOGRAPHY Right 09/04/2018   Procedure: Lower Extremity Angiography;  Surgeon: Algernon Huxley, MD;  Location: [place] [facility];  Service: Cardiovascular;  Laterality: Right;   LOWER EXTREMITY ANGIOGRAPHY Right 10/01/2018   Procedure: LOWER EXTREMITY ANGIOGRAPHY;  Surgeon: Algernon Huxley, MD;  Location: [place] [facility];  Service: Cardiovascular;  Laterality: Right;   LOWER EXTREMITY ANGIOGRAPHY Right 10/01/2018   Procedure: Lower Extremity Angiography;  Surgeon: Algernon Huxley, MD;  Location: [facility];  Service: Cardiovascular;  Laterality: Right;   LOWER EXTREMITY ANGIOGRAPHY Right 10/15/2018   Procedure: LOWER EXTREMITY ANGIOGRAPHY;  Surgeon: Algernon Huxley, MD;  Location: [place];   Service: Cardiovascular;  Laterality: Right;   LOWER EXTREMITY ANGIOGRAPHY Left 10/16/2018   Procedure: Lower Extremity Angiography;  Surgeon: Algernon Huxley, MD;  Location: [place];  Service: Cardiovascular;  Laterality: Left;   LOWER EXTREMITY ANGIOGRAPHY Left 01/20/2019   Procedure: LOWER EXTREMITY ANGIOGRAPHY;  Surgeon: Algernon Huxley, MD;  Location: [place] [facility];  Service: Cardiovascular;  Laterality: Left;   LOWER EXTREMITY ANGIOGRAPHY Right 01/21/2019   Procedure: Lower Extremity Angiography;  Surgeon: Algernon Huxley, MD;  Location: [place] [facility];  Service: Cardiovascular;  Laterality: Right;   LOWER EXTREMITY ANGIOGRAPHY Right 01/28/2019   Procedure: LOWER EXTREMITY ANGIOGRAPHY;  Surgeon: Algernon Huxley, MD;  Location: [place] [facility];  Service: Cardiovascular;  Laterality: Right;   LOWER EXTREMITY ANGIOGRAPHY Right 01/29/2019   Procedure: Lower Extremity Angiography;  Surgeon: Algernon Huxley, MD;  Location: [place] [facility];  Service: Cardiovascular;  Laterality: Right;   LOWER EXTREMITY ANGIOGRAPHY Right 04/04/2020   Procedure: LOWER EXTREMITY ANGIOGRAPHY;  Surgeon: Waynetta Sandy, MD;  Location: [place] [facility];  Service: Cardiovascular;  Laterality: Right;   LOWER EXTREMITY INTERVENTION Right 07/02/2018   Procedure: LOWER EXTREMITY INTERVENTION;  Surgeon: Algernon Huxley, MD;  Location: [place];  Service: Cardiovascular;  Laterality: Right;   PERIPHERAL VASCULAR BALLOON ANGIOPLASTY Left 04/04/2020   Procedure: PERIPHERAL VASCULAR BALLOON ANGIOPLASTY;  Surgeon: Waynetta Sandy, MD;  Location: [facility];  Service: Cardiovascular;  Laterality: Left;  external iliac   POLYPECTOMY N/A 02/15/2016   Procedure: POLYPECTOMY;  Surgeon: Lucilla Lame, MD;  Location: [place];  Service: Endoscopy;  Laterality: N/A;    Short Social History:  Social History   Tobacco Use   Smoking status: Former    Packs/day: 0.50    Years:  40.00    Pack years: 20.00    Types: Cigarettes    Start date: 07/21/1978    Quit date: 12/2018    Years since quitting: 2.2   Smokeless tobacco: Never  Substance Use Topics   Alcohol use: Not Currently    Alcohol/week: 12.0 standard drinks    Types: 12 Cans of beer per week    Comment: 4 beers / week now per pt 05/05/20    No Known Allergies  Current Outpatient Medications  Medication Sig Dispense Refill   albuterol (VENTOLIN HFA) 108 (90 Base) MCG/ACT inhaler Inhale 2 puffs into the lungs every 6 (six) hours as needed for wheezing or shortness of breath. 18 g 0   amLODipine-valsartan (EXFORGE) 5-160 MG tablet Take 1 tablet by mouth daily. 90 tablet 1   ascorbic acid (VITAMIN C) 500 MG tablet Take 1 tablet (500 mg total) by mouth daily. 30 tablet 0   aspirin EC 81 MG tablet Take 1 tablet (81 mg total) by mouth daily. 90 tablet 3   atorvastatin (LIPITOR) 20 MG tablet Take 1 tablet (20 mg total) by mouth daily. 90 tablet 1   Cholecalciferol (DIALYVITE VITAMIN D 5000) 125 MCG (5000 UT) capsule Take 5,000 Units by mouth daily.     clopidogrel (PLAVIX)  75 MG tablet Take 1 tablet (75 mg total) by mouth daily. 90 tablet 1   Cyanocobalamin (B-12) 500 MCG SUBL Place 1 tablet under the tongue daily. (Patient taking differently: Place 500 mcg under the tongue daily.) 30 tablet 0   DULoxetine (CYMBALTA) 60 MG capsule Take 1 capsule (60 mg total) by mouth daily. 90 capsule 1   Fluticasone-Umeclidin-Vilant (TRELEGY ELLIPTA) 100-62.5-25 MCG/INH AEPB Inhale 1 puff into the lungs daily as needed (shortness of breath). 60 each 5   gabapentin (NEURONTIN) 100 MG capsule Take 1 capsule (100 mg total) by mouth 3 (three) times daily. 270 capsule 1   Multiple Vitamin (MULTIVITAMIN WITH MINERALS) TABS tablet Take 1 tablet by mouth daily.     pantoprazole (PROTONIX) 20 MG tablet Take 1 tablet by mouth once daily 90 tablet 0   traZODone (DESYREL) 50 MG tablet Take 1.5 tablets (75 mg total) by mouth at bedtime.  135 tablet 0   No current facility-administered medications for this visit.    Review of Systems  Constitutional:  Constitutional negative. HENT: HENT negative.  Eyes: Eyes negative.  Cardiovascular: Cardiovascular negative.  GI: Gastrointestinal negative.  Musculoskeletal:       Right below-knee amputation site pain Skin: Skin negative.  Neurological: Neurological negative. Hematologic: Hematologic/lymphatic negative.  Psychiatric: Psychiatric negative.       Objective:  Objective   Vitals:   04/04/21 0849  BP: 134/85  Pulse: 62  Resp: 20  Temp: 97.8 F (36.6 C)  SpO2: 97%  Weight: 152 lb (68.9 kg)  Height: 5\' 7"  (1.702 m)   Body mass index is 23.81 kg/m.  Physical Exam HENT:     Head: Normocephalic.     Nose:     Comments: Wearing a mask Eyes:     Pupils: Pupils are equal, round, and reactive to light.  Cardiovascular:     Rate and Rhythm: Normal rate.  Pulmonary:     Effort: Pulmonary effort is normal.  Abdominal:     General: Abdomen is flat.     Palpations: Abdomen is soft.  Musculoskeletal:     Comments: Right below-knee amputation site is cool to the touch there are no ulcerations skin does appear well perfused, there is a bypass graft that ends just below the knee does not appear to have any areas of irritation around it.  Skin:    General: Skin is warm and dry.  Neurological:     General: No focal deficit present.     Mental Status: He is alert.  Psychiatric:        Mood and Affect: Mood normal.    Data: ABI Findings:   +---------+------------------+-----+---------+-------+  Left     Lt Pressure (mmHg)IndexWaveform Comment  +---------+------------------+-----+---------+-------+  Brachial 156                                      +---------+------------------+-----+---------+-------+  PTA      167               1.07 triphasic         +---------+------------------+-----+---------+-------+  DP       165                1.06 triphasic         +---------+------------------+-----+---------+-------+  [place]               0.75 Normal            +---------+------------------+-----+---------+-------+   +-------+-----------+-----------+------------+------------+  ABI/TBIToday's ABIToday's TBIPrevious ABIPrevious TBI  +-------+-----------+-----------+------------+------------+  Left   1.07       0.75       1.12        0.51          +-------+-----------+-----------+------------+------------+    Abdominal Aorta Findings:  +--------+-------+----------+----------+--------+--------+--------+  LocationAP (cm)Trans (cm)PSV (cm/s)WaveformThrombusComments  +--------+-------+----------+----------+--------+--------+--------+  Distal                   74        biphasic                  +--------+-------+----------+----------+--------+--------+--------+    Right Stent(s):  +--------------+--------+--------+--------+--------+  EIA           PSV cm/sStenosisWaveformComments  +--------------+--------+--------+--------+--------+  Prox to Stent 173             biphasic          +--------------+--------+--------+--------+--------+  Proximal Stent102             biphasic          +--------------+--------+--------+--------+--------+  Mid Stent     135             biphasic          +--------------+--------+--------+--------+--------+  Distal Stent  152             biphasic          +--------------+--------+--------+--------+--------+       +--------------+--------+--------+--------+--------+  CIA           PSV cm/sStenosisWaveformComments  +--------------+--------+--------+--------+--------+  Prox to Stent 74              biphasic          +--------------+--------+--------+--------+--------+  Proximal Stent106             biphasic          +--------------+--------+--------+--------+--------+  Mid Stent     178              biphasic          +--------------+--------+--------+--------+--------+  Distal Stent  173             biphasic          +--------------+--------+--------+--------+--------+          Summary:  Stenosis: +--------------------+-----------+  Location            Stent        +--------------------+-----------+  Right Common Iliac  no stenosis  +--------------------+-----------+  Right External Iliacno stenosis  +--------------------+-----------+          Assessment/Plan:     65 year old male status post right below-knee amputation.  He also has left external iliac artery stent.  Left lower extremity is well-perfused.  Right lower extremity does have pain however it does feel cool I think he has diminished blood flow and will be high risk for any interventions on the leg.  I have counseled him on smoking cessation.  He will continue aspirin, Plavix and statin.  I will have him follow-up in 1 year with repeat ABIs.     Waynetta Sandy MD [facility]

## 2021-05-01 ENCOUNTER — Emergency Department (HOSPITAL_COMMUNITY): Payer: 59

## 2021-05-01 ENCOUNTER — Emergency Department (HOSPITAL_COMMUNITY)
Admission: EM | Admit: 2021-05-01 | Discharge: 2021-05-01 | Disposition: A | Discharge status: Home / Self Care | Payer: 59 | Attending: Emergency Medicine | Admitting: Emergency Medicine

## 2021-05-01 ENCOUNTER — Other Ambulatory Visit: Payer: Self-pay

## 2021-05-01 ENCOUNTER — Encounter (HOSPITAL_COMMUNITY): Payer: Self-pay | Admitting: Emergency Medicine

## 2021-05-01 DIAGNOSIS — Z79899 Other long term (current) drug therapy: Secondary | ICD-10-CM | POA: Diagnosis not present

## 2021-05-01 DIAGNOSIS — Z87891 Personal history of nicotine dependence: Secondary | ICD-10-CM | POA: Diagnosis not present

## 2021-05-01 DIAGNOSIS — Z20822 Contact with and (suspected) exposure to covid-19: Secondary | ICD-10-CM | POA: Insufficient documentation

## 2021-05-01 DIAGNOSIS — N183 Chronic kidney disease, stage 3 unspecified: Secondary | ICD-10-CM | POA: Diagnosis not present

## 2021-05-01 DIAGNOSIS — Z7982 Long term (current) use of aspirin: Secondary | ICD-10-CM | POA: Insufficient documentation

## 2021-05-01 DIAGNOSIS — I129 Hypertensive chronic kidney disease with stage 1 through stage 4 chronic kidney disease, or unspecified chronic kidney disease: Secondary | ICD-10-CM | POA: Diagnosis not present

## 2021-05-01 DIAGNOSIS — R000 Tachycardia, unspecified: Secondary | ICD-10-CM | POA: Insufficient documentation

## 2021-05-01 DIAGNOSIS — B349 Viral infection, unspecified: Secondary | ICD-10-CM | POA: Diagnosis not present

## 2021-05-01 DIAGNOSIS — J40 Bronchitis, not specified as acute or chronic: Secondary | ICD-10-CM | POA: Diagnosis not present

## 2021-05-01 DIAGNOSIS — R059 Cough, unspecified: Secondary | ICD-10-CM | POA: Diagnosis present

## 2021-05-01 LAB — RESP PANEL BY RT-PCR (FLU A&B, COVID) ARPGX2
Influenza A by PCR: NEGATIVE
Influenza B by PCR: NEGATIVE
SARS Coronavirus 2 by RT PCR: NEGATIVE

## 2021-05-01 MED ORDER — IPRATROPIUM BROMIDE 0.02 % IN SOLN
0.5000 mg | Freq: Once | RESPIRATORY_TRACT | Status: AC
  Administered 2021-05-01: 0.5 mg via RESPIRATORY_TRACT
  Filled 2021-05-01: qty 2.5

## 2021-05-01 MED ORDER — ALBUTEROL SULFATE (2.5 MG/3ML) 0.083% IN NEBU
5.0000 mg | INHALATION_SOLUTION | Freq: Once | RESPIRATORY_TRACT | Status: AC
  Administered 2021-05-01: 5 mg via RESPIRATORY_TRACT
  Filled 2021-05-01: qty 6

## 2021-05-01 MED ORDER — AMLODIPINE BESYLATE 5 MG PO TABS
5.0000 mg | ORAL_TABLET | Freq: Once | ORAL | Status: AC
  Administered 2021-05-01: 5 mg via ORAL
  Filled 2021-05-01: qty 1

## 2021-05-01 MED ORDER — ACETAMINOPHEN 325 MG PO TABS
650.0000 mg | ORAL_TABLET | Freq: Once | ORAL | Status: AC
  Administered 2021-05-01: 650 mg via ORAL

## 2021-05-01 MED ORDER — PREDNISONE 20 MG PO TABS
60.0000 mg | ORAL_TABLET | Freq: Once | ORAL | Status: AC
  Administered 2021-05-01: 60 mg via ORAL
  Filled 2021-05-01: qty 3

## 2021-05-01 MED ORDER — PREDNISONE 20 MG PO TABS: 40.0000 mg | ORAL_TABLET | Freq: Every day | ORAL | 0 refills | Status: DC

## 2021-05-01 MED ORDER — IRBESARTAN 150 MG PO TABS
150.0000 mg | ORAL_TABLET | Freq: Once | ORAL | Status: AC
  Administered 2021-05-01: 150 mg via ORAL
  Filled 2021-05-01: qty 1

## 2021-05-01 NOTE — Discharge Instructions (Addendum)
The COVID and flu were negative today.  Your chest x-ray looks okay.  You will need to take Tylenol to tablets every 6 hours for fever and headache.  You need to take your next dose of steroids tomorrow.  Also use your albuterol inhaler every 4-6 hours as needed for cough and wheezing.  Continue to use your trilogy inhaler.  However if you start getting worse feel like you cannot catch her breath, start having severe chest pain, vomiting and unable to hold anything down or passing out you should return to the emergency room.

## 2021-05-01 NOTE — ED Triage Notes (Signed)
Patient c/o headache, body aches and fever onset of waking up this morning. Has productive cough. Recently around 2 kids with flu.

## 2021-05-01 NOTE — ED Notes (Signed)
Pt verbalized understanding of d/c instructions, meds, and followup care. Denies questions. VSS, no distress noted. Assisted into prosthesis and into wheelchair. Pt wheeled out to employee entrance and closer to care in parking lot.

## 2021-05-01 NOTE — ED Provider Notes (Signed)
Emergency Medicine Provider Triage Evaluation Note  Ryan [name] , a 65 y.o. male  was evaluated in triage.  Pt complains of headache, cough and fever beginning this morning.  He states that over the weekend he was recently around 2 children who tested positive for the flu.  He states that symptoms really began last night with headache however woke up this morning with productive cough, body aches and fever.  He states that he has decreased appetite this morning.  Denies chest pain, nausea, vomiting or diarrhea..  Review of Systems  Positive: Cough headache, body aches, fever Negative: Chest pain  Physical Exam  BP (!) 156/90 (BP Location: Right Arm)   Pulse (!) 108   Temp (!) 101.9 F (38.8 C) (Oral)   Resp 19   Ht 5\' 9"  (1.753 m)   Wt 70.3 kg   SpO2 96%   BMI 22.89 kg/m  Gen:   Awake, no distress, warm to touch Resp:  Normal effort, lungs clear to auscultation bilaterally.  There are coarse breath sounds that clear to coughing.  No tachypnea MSK:   Moves extremities without difficulty  Other:  Oropharynx with mild erythema without tonsillar exudates or swelling.  Medical Decision Making  Medically screening exam initiated at 1:10 PM.  Appropriate orders placed.  TERRIL CHESTNUT was informed that the remainder of the evaluation will be completed by another provider, this initial triage assessment does not replace that evaluation, and the importance of remaining in the ED until their evaluation is complete.     Mickie Hillier, PA-C 05/01/21 1312    Lucrezia Starch, MD 05/01/21 [phone]

## 2021-05-01 NOTE — ED Provider Notes (Signed)
[facility] EMERGENCY DEPARTMENT Provider Note   CSN: [id number] Arrival date & time: 05/01/21  1234     History Chief Complaint  Patient presents with   Fever    Ryan [name] is a 65 y.o. male.  Patient is a 65 year old male with a history of hypertension, hyperlipidemia, CKD, tobacco use and lung disease who is presenting today with complaints of cough, intermittent shortness of breath, generalized body aches and fever.  All of his symptoms started 5 days prior to arrival.  Friday he started having a scratchy throat and occasional coughing.  He was taking Alka-Seltzer cold and felt okay Friday Saturday and Sunday.  However last night he started to feel worse and got TheraFlu.  When he woke up this morning he had diffuse body aches, fever, worsening cough and some shortness of breath.  He reports the shortness of breath is only present when bending over.  He had 1 episode of vomiting this morning but no persistent vomiting.  No sore throat at this time.  No chest pain, abdominal pain or diarrhea.  2 girls in his house have tested positive for flu.  He has not used his inhalers today and has not taken any of his blood pressure medication.  The history is provided by the patient and medical records.  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[place]) 05/17/2020   Iron deficiency anemia 11/23/2018   Stage 3 chronic kidney disease ([place]) 10/21/2018   Atherosclerosis of native arteries of extremity with rest pain ([place]) 10/13/2018   PAD (peripheral artery disease) ([place]) 07/21/2018   Claudication ([place])  03/17/2018   B12 deficiency 03/12/2018   Vitamin B1 deficiency 03/12/2018   Varicose veins of leg with pain, right 03/12/2018   Enlarged thoracic aorta ([place]) 02/24/2018   Alcoholism /alcohol abuse 02/24/2018   Paresthesia 02/24/2018   Ectatic aorta ([place]) 02/20/2018   Emphysema lung ([place]) 02/20/2018   Atherosclerosis of aorta ([place]) 02/20/2018   Hypertension, benign 08/31/2015   Tobacco use 08/31/2015    Past Surgical History:  Procedure Laterality Date   ABDOMINAL AORTOGRAM W/LOWER EXTREMITY N/A 09/13/2019   Procedure: ABDOMINAL AORTOGRAM W/LOWER EXTREMITY;  Surgeon: Waynetta Sandy, MD;  Location: [place] [facility];  Service: Cardiovascular;  Laterality: N/A;   AMPUTATION Right 05/09/2020   Procedure: RIGHT BELOW KNEE AMPUTATION;  Surgeon: Waynetta Sandy, MD;  Location: [place];  Service: Vascular;  Laterality: Right;  w/ a block   COLONOSCOPY WITH PROPOFOL N/A 02/15/2016   Procedure: COLONOSCOPY WITH PROPOFOL;  Surgeon: Lucilla Lame, MD;  Location: [place];  Service: Endoscopy;  Laterality: N/A;   FEMORAL-POPLITEAL BYPASS GRAFT Right 09/14/2019   Procedure: BYPASS GRAFT FEMORAL-POPLITEAL ARTERY;  Surgeon: Waynetta Sandy, MD;  Location: [place];  Service: Vascular;  Laterality: Right;   HERNIA REPAIR  1999   left inguinal   INSERTION OF ILIAC STENT Right 09/14/2019   Procedure: Insertion Of Common and External Iliac Stent;  Surgeon: Waynetta Sandy, MD;  Location: [place];  Service: Vascular;  Laterality: Right;   LOWER EXTREMITY ANGIOGRAPHY Right 05/07/2018   Procedure: LOWER EXTREMITY ANGIOGRAPHY;  Surgeon: Algernon Huxley, MD;  Location: [place] [facility];  Service: Cardiovascular;  Laterality: Right;   LOWER EXTREMITY ANGIOGRAPHY Right 06/25/2018   Procedure: LOWER EXTREMITY ANGIOGRAPHY;  Surgeon: Algernon Huxley, MD;  Location: [place] [facility];  Service: Cardiovascular;  Laterality: Right;   LOWER EXTREMITY ANGIOGRAPHY Right 07/01/2018    Procedure: LOWER EXTREMITY ANGIOGRAPHY;  Surgeon: Algernon Huxley, MD;  Location: [place] [facility];  Service: Cardiovascular;  Laterality: Right;   LOWER EXTREMITY ANGIOGRAPHY Right 08/12/2018   Procedure: LOWER EXTREMITY ANGIOGRAPHY;  Surgeon: Algernon Huxley, MD;  Location: [place] [facility];  Service: Cardiovascular;  Laterality: Right;   LOWER EXTREMITY ANGIOGRAPHY Right 09/03/2018   Procedure: LOWER EXTREMITY ANGIOGRAPHY;  Surgeon: Algernon Huxley, MD;  Location: [place] [facility];  Service: Cardiovascular;  Laterality: Right;   LOWER EXTREMITY ANGIOGRAPHY Right 09/04/2018   Procedure: Lower Extremity Angiography;  Surgeon: Algernon Huxley, MD;  Location: [place] [facility];  Service: Cardiovascular;  Laterality: Right;   LOWER EXTREMITY ANGIOGRAPHY Right 10/01/2018   Procedure: LOWER EXTREMITY ANGIOGRAPHY;  Surgeon: Algernon Huxley, MD;  Location: [place] [facility];  Service: Cardiovascular;  Laterality: Right;   LOWER EXTREMITY ANGIOGRAPHY Right 10/01/2018   Procedure: Lower Extremity Angiography;  Surgeon: Algernon Huxley, MD;  Location: [place] [facility];  Service: Cardiovascular;  Laterality: Right;   LOWER EXTREMITY ANGIOGRAPHY Right 10/15/2018   Procedure: LOWER EXTREMITY ANGIOGRAPHY;  Surgeon: Algernon Huxley, MD;  Location: [place] [facility];  Service: Cardiovascular;  Laterality: Right;   LOWER EXTREMITY ANGIOGRAPHY Left 10/16/2018   Procedure: Lower Extremity Angiography;  Surgeon: Algernon Huxley, MD;  Location: [place] [facility];  Service: Cardiovascular;  Laterality: Left;   LOWER EXTREMITY ANGIOGRAPHY Left 01/20/2019   Procedure: LOWER EXTREMITY ANGIOGRAPHY;  Surgeon: Algernon Huxley, MD;  Location: [place] [facility];  Service: Cardiovascular;  Laterality: Left;   LOWER EXTREMITY ANGIOGRAPHY Right 01/21/2019   Procedure: Lower Extremity Angiography;  Surgeon: Algernon Huxley, MD;  Location: [place] [facility];  Service: Cardiovascular;  Laterality: Right;   LOWER EXTREMITY ANGIOGRAPHY  Right 01/28/2019   Procedure: LOWER EXTREMITY ANGIOGRAPHY;  Surgeon: Algernon Huxley, MD;  Location: [place] [facility];  Service: Cardiovascular;  Laterality: Right;   LOWER EXTREMITY ANGIOGRAPHY Right 01/29/2019   Procedure: Lower Extremity Angiography;  Surgeon: Algernon Huxley, MD;  Location: [place] [facility];  Service: Cardiovascular;  Laterality: Right;   LOWER EXTREMITY ANGIOGRAPHY Right 04/04/2020   Procedure: LOWER EXTREMITY ANGIOGRAPHY;  Surgeon: Waynetta Sandy, MD;  Location: [place] [facility];  Service: Cardiovascular;  Laterality: Right;   LOWER EXTREMITY INTERVENTION Right 07/02/2018   Procedure: LOWER EXTREMITY INTERVENTION;  Surgeon: Algernon Huxley, MD;  Location: [place] [facility];  Service: Cardiovascular;  Laterality: Right;   PERIPHERAL VASCULAR BALLOON ANGIOPLASTY Left 04/04/2020   Procedure: PERIPHERAL VASCULAR BALLOON ANGIOPLASTY;  Surgeon: Waynetta Sandy, MD;  Location: [place] [facility];  Service: Cardiovascular;  Laterality: Left;  external iliac   POLYPECTOMY N/A 02/15/2016   Procedure: POLYPECTOMY;  Surgeon: Lucilla Lame, MD;  Location: [place];  Service: Endoscopy;  Laterality: N/A;       Family History  Problem Relation Age of Onset   COPD Mother    Hypertension Father    Heart attack Brother     Social History  Tobacco Use   Smoking status: Former    Packs/day: 0.50    Years: 40.00    Pack years: 20.00    Types: Cigarettes    Start date: 07/21/1978    Quit date: 12/2018    Years since quitting: 2.3   Smokeless tobacco: Never  Vaping Use   Vaping Use: Never used  Substance Use Topics   Alcohol use: Not Currently    Alcohol/week: 12.0 standard drinks    Types: 12 Cans of beer per week    Comment: 4 beers / week now per pt 05/05/20   Drug use: Never    Home Medications Prior to Admission medications   Medication Sig Start Date End Date Taking? Authorizing Provider  albuterol (VENTOLIN HFA) 108 (90 Base) MCG/ACT  inhaler Inhale 2 puffs into the lungs every 6 (six) hours as needed for wheezing or shortness of breath. 10/18/20  Yes Sowles, Drue Stager, MD  Fluticasone-Umeclidin-Vilant (TRELEGY ELLIPTA) 100-62.5-25 MCG/INH AEPB Inhale 1 puff into the lungs daily as needed (shortness of breath). 10/18/20  Yes Sowles, Drue Stager, MD  gabapentin (NEURONTIN) 100 MG capsule Take 1 capsule (100 mg total) by mouth 3 (three) times daily. 10/18/20  Yes Sowles, Drue Stager, MD  amLODipine-valsartan (EXFORGE) 5-160 MG tablet Take 1 tablet by mouth daily. 10/18/20   Steele Sizer, MD  ascorbic acid (VITAMIN C) 500 MG tablet Take 1 tablet (500 mg total) by mouth daily. 05/25/20   Love, Ivan Anchors, PA-C  aspirin EC 81 MG tablet Take 1 tablet (81 mg total) by mouth daily. 08/13/18   Stegmayer, Joelene Millin A, PA-C  atorvastatin (LIPITOR) 20 MG tablet Take 1 tablet (20 mg total) by mouth daily. 10/18/20   Steele Sizer, MD  Cholecalciferol (DIALYVITE VITAMIN D 5000) 125 MCG (5000 UT) capsule Take 5,000 Units by mouth daily.    [provider]  clopidogrel (PLAVIX) 75 MG tablet Take 1 tablet (75 mg total) by mouth daily. 10/18/20   Steele Sizer, MD  Cyanocobalamin (B-12) 500 MCG SUBL Place 1 tablet under the tongue daily. Patient taking differently: Place 500 mcg under the tongue daily. 03/14/20   Steele Sizer, MD  DULoxetine (CYMBALTA) 60 MG capsule Take 1 capsule (60 mg total) by mouth daily. 10/18/20   Steele Sizer, MD  Multiple Vitamin (MULTIVITAMIN WITH MINERALS) TABS tablet Take 1 tablet by mouth daily. 05/25/20   Love, Ivan Anchors, PA-C  pantoprazole (PROTONIX) 20 MG tablet Take 1 tablet by mouth once daily 02/05/21   Steele Sizer, MD  traZODone (DESYREL) 50 MG tablet Take 1.5 tablets (75 mg total) by mouth at bedtime. 01/17/21   Steele Sizer, MD    Allergies    Patient has no known allergies.  Review of Systems   Review of Systems  Constitutional:  Positive for fever.  All other systems reviewed and are  negative.  Physical Exam Updated Vital Signs BP (!) 156/90 (BP Location: Right Arm)   Pulse (!) 108   Temp (!) 101.9 F (38.8 C) (Oral)   Resp 19   Ht 5\' 9"  (1.753 m)   Wt 70.3 kg   SpO2 96%   BMI 22.89 kg/m   Physical Exam Vitals and nursing note reviewed.  Constitutional:      General: He is not in acute distress.    Appearance: He is well-developed.  HENT:     Head: Normocephalic and atraumatic.     Nose: Congestion present.     Mouth/Throat:     Mouth: Mucous membranes are moist.  Eyes:  Conjunctiva/sclera: Conjunctivae normal.     Pupils: Pupils are equal, round, and reactive to light.  Cardiovascular:     Rate and Rhythm: Regular rhythm. Tachycardia present.     Pulses: Normal pulses.     Heart sounds: No murmur heard. Pulmonary:     Effort: Pulmonary effort is normal. No respiratory distress.     Breath sounds: Wheezing present. No rales.  Abdominal:     General: There is no distension.     Palpations: Abdomen is soft.     Tenderness: There is no abdominal tenderness. There is no guarding or rebound.  Musculoskeletal:        General: No tenderness. Normal range of motion.     Cervical back: Normal range of motion and neck supple. No tenderness.     Comments: Right BKA  Skin:    General: Skin is warm and dry.     Findings: No erythema or rash.  Neurological:     Mental Status: He is alert and oriented to person, place, and time. Mental status is at baseline.  Psychiatric:        Mood and Affect: Mood normal.        Behavior: Behavior normal.    ED Results / Procedures / Treatments   Labs (all labs ordered are listed, but only abnormal results are displayed) Labs Reviewed  RESP PANEL BY RT-PCR (FLU A&B, COVID) [id number]    EKG None  Radiology DG Chest Port 1 View  Result Date: 05/01/2021 CLINICAL DATA:  SOB, productive cough, Fever, headache, and increased B.P. Exposed to th FLU. Hx HTN, CKD, and smoker.cough, sob EXAM: PORTABLE CHEST 1 VIEW  COMPARISON:  CT chest 03/16/2020 FINDINGS: Normal cardiac silhouette. Similar bullous change in the medial LEFT upper lobe. Calcified pleural plaques in the LEFT lower lobe. RIGHT lung relatively clear. No clear acute findings. IMPRESSION: 1. bullous change in the LEFT upper lobe. 2. Calcified pleural plaques the LEFT hemithorax. 3. No interval change. Electronically Signed   By: Suzy Bouchard M.D.   On: 05/01/2021 14:45    Procedures Procedures   Medications Ordered in ED Medications  albuterol (PROVENTIL) (2.5 MG/3ML) 0.083% nebulizer solution 5 mg (has no administration in time range)  ipratropium (ATROVENT) nebulizer solution 0.5 mg (has no administration in time range)  amLODipine (NORVASC) tablet 5 mg (has no administration in time range)  irbesartan (AVAPRO) tablet 150 mg (has no administration in time range)  acetaminophen (TYLENOL) tablet 650 mg (650 mg Oral Given 05/01/21 1313)    ED Course  I have reviewed the triage vital signs and the nursing notes.  Pertinent labs & imaging results that were available during my care of the patient were reviewed by me and considered in my medical decision making (see chart for details).    MDM Rules/Calculators/A&P                           Pt with symptoms consistent with influenza/covid/ or other viral etiology.  Here pt is febrile and wheezing.  No signs of breathing difficulty and O2 sat 96% on RA.  No signs of strep pharyngitis, otitis or abnormal abdominal findings.   CXR and viral swab pending.  Pt given albuterol/atrovent and his bp meds that he did not take today.  Pt also given tylenol for fever.  2:58 PM COVID and flu are negative.  Chest x-ray without acute findings.  After albuterol and Atrovent patient's wheezing is improved and  he reports his cough is also improved.  Suspect the fever is still viral in nature as he has no evidence of cellulitis, abdominal pain or findings consistent with pneumonia.  Do not feel that he needs  antibiotics at this time but will give a course of steroids and he has an albuterol inhaler at home.  Will continue antipyretica and rest and fluids and return for any further problems.  MDM   Amount and/or Complexity of Data Reviewed Clinical lab tests: ordered and reviewed Tests in the medicine section of CPT: ordered and reviewed Independent visualization of images, tracings, or specimens: yes  Patient Progress Patient progress: stable    Final Clinical Impression(s) / ED Diagnoses Final diagnoses:  Viral illness  Bronchitis    Rx / DC Orders ED Discharge Orders          Ordered    predniSONE (DELTASONE) 20 MG tablet  Daily        05/01/21 1500             Blanchie Dessert, MD 05/01/21 1501

## 2021-05-16 NOTE — Progress Notes (Signed)
Name: Ryan [name]   MRN: [id number]    DOB: 09-Feb-1956   Date:05/21/2021       Progress Note  Subjective  Chief Complaint  Follow Up  HPI   Phantom pains: still experiencing phantom pains but states they are "normal". Describes them as increasing when he walks for a long time. States prosthesis is still performing well.   Dysthymia: States he is doing "good". Denies sadness or depressed mood today.   Insomnia: States it is "normal". Using Trazodone- sometimes takes 1.5 and sometimes doesn't need it.   HTN: Blood pressure is still well controlled today. Pt states he has gained ~4 lbs. States he is walking a lot more.   Emphysema: States he is wheezing at night when he goes to lay down. Would like to discuss a nebulizer today. States he is using Ventolin about once per day. Still smoking currently ~5 cigarettes per day. Does not want patches or anything to assist with cessation, states he can quit on his own. Would like a flu vaccine today.   Atherosclerosis of Aorta Aneurysma of ascending aorta: Has not had a CT scan this year for monitoring.    Alcoholism: Still taking Vitamin B12 and B1 for deficiency.Taking B12 every other day. Last lab value was >2000. Confirms continued sobriety.   Tinnitus: Still experiencing ringing. He is using his father's hearing aid and states it works fine. Did go ENT  for tinnitus but was told there wasn't much that could be done. Did not want to pursue further testing or fitting for individual hearing aid.    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 ([place]) 10/13/2018   PAD (peripheral artery disease) ([place]) 07/21/2018   Claudication ([place]) 03/17/2018   B12 deficiency 03/12/2018   Vitamin B1  deficiency 03/12/2018   Varicose veins of leg with pain, right 03/12/2018   Enlarged thoracic aorta ([place]) 02/24/2018   Alcoholism /alcohol abuse 02/24/2018   Paresthesia 02/24/2018   Ectatic aorta ([place]) 02/20/2018   Emphysema lung ([place]) 02/20/2018   Atherosclerosis of aorta ([place]) 02/20/2018   Hypertension, benign 08/31/2015   Tobacco use 08/31/2015    Past Surgical History:  Procedure Laterality Date   ABDOMINAL AORTOGRAM W/LOWER EXTREMITY N/A 09/13/2019   Procedure: ABDOMINAL AORTOGRAM W/LOWER EXTREMITY;  Surgeon: Waynetta Sandy, MD;  Location: [facility];  Service: Cardiovascular;  Laterality: N/A;   AMPUTATION Right 05/09/2020   Procedure: RIGHT BELOW KNEE AMPUTATION;  Surgeon: Waynetta Sandy, MD;  Location: [facility];  Service: Vascular;  Laterality: Right;  w/ a block   COLONOSCOPY WITH PROPOFOL N/A 02/15/2016   Procedure: COLONOSCOPY WITH PROPOFOL;  Surgeon: Lucilla Lame, MD;  Location: [facility];  Service: Endoscopy;  Laterality: N/A;   FEMORAL-POPLITEAL BYPASS GRAFT Right 09/14/2019   Procedure: BYPASS GRAFT FEMORAL-POPLITEAL ARTERY;  Surgeon: Waynetta Sandy, MD;  Location: [facility];  Service: Vascular;  Laterality: Right;   HERNIA REPAIR  1999   left inguinal   INSERTION OF ILIAC STENT Right 09/14/2019   Procedure: Insertion Of Common and External Iliac Stent;  Surgeon: Waynetta Sandy, MD;  Location: [facility];  Service: Vascular;  Laterality: Right;   LOWER EXTREMITY ANGIOGRAPHY Right 05/07/2018   Procedure: LOWER EXTREMITY ANGIOGRAPHY;  Surgeon: Algernon Huxley, MD;  Location: [facility];  Service: Cardiovascular;  Laterality: Right;   LOWER EXTREMITY ANGIOGRAPHY Right 06/25/2018   Procedure: LOWER EXTREMITY ANGIOGRAPHY;  Surgeon: Algernon Huxley, MD;  Location: [facility];  Service: Cardiovascular;  Laterality: Right;   LOWER EXTREMITY ANGIOGRAPHY Right 07/01/2018   Procedure: LOWER EXTREMITY ANGIOGRAPHY;  Surgeon:  Algernon Huxley, MD;  Location: [facility];  Service: Cardiovascular;  Laterality: Right;   LOWER EXTREMITY ANGIOGRAPHY Right 08/12/2018   Procedure: LOWER EXTREMITY ANGIOGRAPHY;  Surgeon: Algernon Huxley, MD;  Location: [facility];  Service: Cardiovascular;  Laterality: Right;   LOWER EXTREMITY ANGIOGRAPHY Right 09/03/2018   Procedure: LOWER EXTREMITY ANGIOGRAPHY;  Surgeon: Algernon Huxley, MD;  Location: [facility];  Service: Cardiovascular;  Laterality: Right;   LOWER EXTREMITY ANGIOGRAPHY Right 09/04/2018   Procedure: Lower Extremity Angiography;  Surgeon: Algernon Huxley, MD;  Location: [facility];  Service: Cardiovascular;  Laterality: Right;   LOWER EXTREMITY ANGIOGRAPHY Right 10/01/2018   Procedure: LOWER EXTREMITY ANGIOGRAPHY;  Surgeon: Algernon Huxley, MD;  Location: [facility];  Service: Cardiovascular;  Laterality: Right;   LOWER EXTREMITY ANGIOGRAPHY Right 10/01/2018   Procedure: Lower Extremity Angiography;  Surgeon: Algernon Huxley, MD;  Location: [facility];  Service: Cardiovascular;  Laterality: Right;   LOWER EXTREMITY ANGIOGRAPHY Right 10/15/2018   Procedure: LOWER EXTREMITY ANGIOGRAPHY;  Surgeon: Algernon Huxley, MD;  Location: [facility];  Service: Cardiovascular;  Laterality: Right;   LOWER EXTREMITY ANGIOGRAPHY Left 10/16/2018   Procedure: Lower Extremity Angiography;  Surgeon: Algernon Huxley, MD;  Location: [facility];  Service: Cardiovascular;  Laterality: Left;   LOWER EXTREMITY ANGIOGRAPHY Left 01/20/2019   Procedure: LOWER EXTREMITY ANGIOGRAPHY;  Surgeon: Algernon Huxley, MD;  Location: [facility];  Service: Cardiovascular;  Laterality: Left;   LOWER EXTREMITY ANGIOGRAPHY Right 01/21/2019   Procedure: Lower Extremity Angiography;  Surgeon: Algernon Huxley, MD;  Location: [facility];  Service: Cardiovascular;  Laterality: Right;   LOWER EXTREMITY ANGIOGRAPHY Right 01/28/2019   Procedure: LOWER EXTREMITY  ANGIOGRAPHY;  Surgeon: Algernon Huxley, MD;  Location: [facility];  Service: Cardiovascular;  Laterality: Right;   LOWER EXTREMITY ANGIOGRAPHY Right 01/29/2019   Procedure: Lower Extremity Angiography;  Surgeon: Algernon Huxley, MD;  Location: [facility];  Service: Cardiovascular;  Laterality: Right;   LOWER EXTREMITY ANGIOGRAPHY Right 04/04/2020   Procedure: LOWER EXTREMITY ANGIOGRAPHY;  Surgeon: Waynetta Sandy, MD;  Location: [facility];  Service: Cardiovascular;  Laterality: Right;   LOWER EXTREMITY INTERVENTION Right 07/02/2018   Procedure: LOWER EXTREMITY INTERVENTION;  Surgeon: Algernon Huxley, MD;  Location: [facility];  Service: Cardiovascular;  Laterality: Right;   PERIPHERAL VASCULAR BALLOON ANGIOPLASTY Left 04/04/2020   Procedure: PERIPHERAL VASCULAR BALLOON ANGIOPLASTY;  Surgeon: Waynetta Sandy, MD;  Location: [facility];  Service: Cardiovascular;  Laterality: Left;  external iliac   POLYPECTOMY N/A 02/15/2016   Procedure: POLYPECTOMY;  Surgeon: Lucilla Lame, MD;  Location: [facility];  Service: Endoscopy;  Laterality: N/A;    Family History  Problem Relation Age of Onset   COPD Mother    Hypertension Father    Heart attack Brother     Social History   Tobacco Use   Smoking status: Former    Packs/day: 0.50    Years: 40.00    Pack  years: 20.00    Types: Cigarettes    Start date: 07/21/1978    Quit date: 12/2018    Years since quitting: 2.4   Smokeless tobacco: Never  Substance Use Topics   Alcohol use: Not Currently    Alcohol/week: 12.0 standard drinks    Types: 12 Cans of beer per week    Comment: 4 beers / week now per pt 05/05/20     Current Outpatient Medications:    albuterol (VENTOLIN HFA) 108 (90 Base) MCG/ACT inhaler, Inhale 2 puffs into the lungs every 6 (six) hours as needed for wheezing or shortness of breath., Disp: 18 g, Rfl: 0   amLODipine-valsartan (EXFORGE) 5-160 MG tablet, Take 1  tablet by mouth daily., Disp: 90 tablet, Rfl: 1   ascorbic acid (VITAMIN C) 500 MG tablet, Take 1 tablet (500 mg total) by mouth daily., Disp: 30 tablet, Rfl: 0   aspirin EC 81 MG tablet, Take 1 tablet (81 mg total) by mouth daily., Disp: 90 tablet, Rfl: 3   atorvastatin (LIPITOR) 20 MG tablet, Take 1 tablet (20 mg total) by mouth daily., Disp: 90 tablet, Rfl: 1   Cholecalciferol (DIALYVITE VITAMIN D 5000) 125 MCG (5000 UT) capsule, Take 5,000 Units by mouth daily., Disp: , Rfl:    clopidogrel (PLAVIX) 75 MG tablet, Take 1 tablet (75 mg total) by mouth daily., Disp: 90 tablet, Rfl: 1   Cyanocobalamin (B-12) 500 MCG SUBL, Place 1 tablet under the tongue daily. (Patient taking differently: Place 500 mcg under the tongue daily.), Disp: 30 tablet, Rfl: 0   DULoxetine (CYMBALTA) 60 MG capsule, Take 1 capsule (60 mg total) by mouth daily., Disp: 90 capsule, Rfl: 1   Fluticasone-Umeclidin-Vilant (TRELEGY ELLIPTA) 100-62.5-25 MCG/INH AEPB, Inhale 1 puff into the lungs daily as needed (shortness of breath)., Disp: 60 each, Rfl: 5   gabapentin (NEURONTIN) 100 MG capsule, Take 1 capsule (100 mg total) by mouth 3 (three) times daily., Disp: 270 capsule, Rfl: 1   Multiple Vitamin (MULTIVITAMIN WITH MINERALS) TABS tablet, Take 1 tablet by mouth daily., Disp: , Rfl:    pantoprazole (PROTONIX) 20 MG tablet, Take 1 tablet by mouth once daily, Disp: 90 tablet, Rfl: 0   traZODone (DESYREL) 50 MG tablet, Take 1.5 tablets (75 mg total) by mouth at bedtime., Disp: 135 tablet, Rfl: 0  No Known Allergies  I personally reviewed active problem list, medication list, allergies, family history, social history, health maintenance with the patient/caregiver today.   ROS  Ten systems reviewed and is negative except as mentioned in HPI   Objective  Vitals:   05/21/21 1006  BP: 132/84  Pulse: 87  Resp: 16  Temp: 97.9 F (36.6 C)  SpO2: 98%  Weight: 158 lb (71.7 kg)  Height: 5\' 7"  (1.702 m)    Body mass index is  24.75 kg/m.  Physical Exam  Constitutional: Patient appears well-developed and well-nourished.  No distress.  HEENT: head atraumatic, normocephalic, pupils equal and reactive to light, neck supple Cardiovascular: Normal rate, regular rhythm and normal heart sounds.  No murmur heard. No BLE edema. Pulmonary/Chest: Effort normal and breath sounds normal. No respiratory distress. Abdominal: Soft.  There is no tenderness. Psychiatric: Patient has a normal mood and affect. behavior is normal. Judgment and thought content normal.  Muscular skeletal: has a prosthesis right lower leg   Recent Results (from the past 2160 hour(s))  Resp Panel by RT-PCR (Flu A&B, Covid) Nasopharyngeal Swab     Status: None   Collection Time: 05/01/21  1:10 PM   Specimen: Nasopharyngeal Swab; Nasopharyngeal(NP) swabs in vial transport medium  Result Value Ref Range   SARS Coronavirus 2 by RT PCR NEGATIVE NEGATIVE    Comment: (NOTE) SARS-CoV-2 target nucleic acids are NOT DETECTED.  The SARS-CoV-2 RNA is generally detectable in upper respiratory specimens during the acute phase of infection. The lowest concentration of SARS-CoV-2 viral copies this assay can detect is 138 copies/mL. A negative result does not preclude SARS-Cov-2 infection and should not be used as the sole basis for treatment or other patient management decisions. A negative result may occur with  improper specimen collection/handling, submission of specimen other than nasopharyngeal swab, presence of viral mutation(s) within the areas targeted by this assay, and inadequate number of viral copies(<138 copies/mL). A negative result must be combined with clinical observations, patient history, and epidemiological information. The expected result is Negative.  Fact Sheet for Patients:  EntrepreneurPulse.com.au  Fact Sheet for Healthcare Providers:  IncredibleEmployment.be  This test is no t yet approved or  cleared by the [place] FDA and  has been authorized for detection and/or diagnosis of SARS-CoV-2 by FDA under an Emergency Use Authorization (EUA). This EUA will remain  in effect (meaning this test can be used) for the duration of the COVID-19 declaration under Section 564(b)(1) of the Act, 21 U.S.C.section 360bbb-3(b)(1), unless the authorization is terminated  or revoked sooner.       Influenza A by PCR NEGATIVE NEGATIVE   Influenza B by PCR NEGATIVE NEGATIVE    Comment: (NOTE) The Xpert Xpress SARS-CoV-2/FLU/RSV plus assay is intended as an aid in the diagnosis of influenza from Nasopharyngeal swab specimens and should not be used as a sole basis for treatment. Nasal washings and aspirates are unacceptable for Xpert Xpress SARS-CoV-2/FLU/RSV testing.  Fact Sheet for Patients: EntrepreneurPulse.com.au  Fact Sheet for Healthcare Providers: IncredibleEmployment.be  This test is not yet approved or cleared by the [place] FDA and has been authorized for detection and/or diagnosis of SARS-CoV-2 by FDA under an Emergency Use Authorization (EUA). This EUA will remain in effect (meaning this test can be used) for the duration of the COVID-19 declaration under Section 564(b)(1) of the Act, 21 U.S.C. section 360bbb-3(b)(1), unless the authorization is terminated or revoked.  Performed at [facility], [place] [address], [place], [place] [address]      PHQ2/9: Depression screen [facility] 2/9 05/21/2021 01/17/2021 10/18/2020 07/13/2020 06/05/2020  Decreased Interest 0 0 0 0 0  Down, Depressed, Hopeless 0 0 0 0 0  PHQ - 2 Score 0 0 0 0 0  Altered sleeping 0 - - - 1  Tired, decreased energy 0 - - - 0  Change in appetite 0 - - - 0  Feeling bad or failure about yourself  0 - - - 0  Trouble concentrating 0 - - - 0  Moving slowly or fidgety/restless 0 - - - 0  Suicidal thoughts 0 - - - 0  PHQ-9 Score 0 - - - 1  Difficult doing work/chores  - - - - -  Some recent data might be hidden    phq 9 is negative   Fall Risk: Fall Risk  05/21/2021 01/17/2021 10/18/2020 07/13/2020 06/05/2020  Falls in the past year? 0 0 0 0 0  Number falls in past yr: 0 0 0 0 0  Injury with Fall? 0 0 0 0 0  Risk for fall due to : No Fall Risks - - - -  Follow up Falls prevention  discussed - - - -      Functional Status Survey: Is the patient deaf or have difficulty hearing?: Yes Does the patient have difficulty seeing, even when wearing glasses/contacts?: No Does the patient have difficulty concentrating, remembering, or making decisions?: No Does the patient have difficulty walking or climbing stairs?: No Does the patient have difficulty dressing or bathing?: No Does the patient have difficulty doing errands alone such as visiting a doctor's office or shopping?: No    Assessment & Plan  Problem List Items Addressed This Visit     Hypertension, benign    Blood pressure is 132/84 today in office Continue current medications for management      Emphysema lung ([place])    Continue Trelegy and Ventolin Prn for symptoms Will add spacer for inhalers to assist with deployment of medications.  Reviewed spacer use  Discussed smoking cessation plan and methods available to patient should he wish for assistance.      Enlarged thoracic aorta ([place])    Encouraged smoking cessation Encouraged compliance with HTN medications CT chest ordered for monitoring and will send referral for Cardiovascular surgeon (in [place])      B12 deficiency    Stop for the rest of the year  In Jan can start taking once per week.  Follow up with lab testing at next physical      Vitamin B1 deficiency    Stop for the rest of the year  In Jan can start taking once per week.  Follow up with lab testing at next physical       Phantom limb pain ([facility])    Continue Duloxetine and Gabapentin for pain management       Relevant Medications   traZODone (DESYREL)  50 MG tablet   Other Visit Diagnoses     Need for immunization against influenza    -  Primary   Relevant Orders   Flu Vaccine QUAD 6+ mos PF IM (Fluarix Quad PF) (Completed)   Aneurysm of ascending aorta without rupture       Relevant Orders   CT Chest W Contrast   Other insomnia       Relevant Medications   traZODone (DESYREL) 50 MG tablet

## 2021-05-21 ENCOUNTER — Encounter: Payer: Self-pay | Admitting: Family Medicine

## 2021-05-21 ENCOUNTER — Other Ambulatory Visit: Payer: Self-pay

## 2021-05-21 ENCOUNTER — Ambulatory Visit (INDEPENDENT_AMBULATORY_CARE_PROVIDER_SITE_OTHER): Payer: 59 | Admitting: Family Medicine

## 2021-05-21 VITALS — BP 132/84 | HR 87 | Temp 97.9°F | Resp 16 | Ht 67.0 in | Wt 158.0 lb

## 2021-05-21 DIAGNOSIS — Z23 Encounter for immunization: Secondary | ICD-10-CM | POA: Diagnosis not present

## 2021-05-21 DIAGNOSIS — J438 Other emphysema: Secondary | ICD-10-CM | POA: Diagnosis not present

## 2021-05-21 DIAGNOSIS — I7121 Aneurysm of the ascending aorta, without rupture: Secondary | ICD-10-CM

## 2021-05-21 DIAGNOSIS — E519 Thiamine deficiency, unspecified: Secondary | ICD-10-CM

## 2021-05-21 DIAGNOSIS — I10 Essential (primary) hypertension: Secondary | ICD-10-CM

## 2021-05-21 DIAGNOSIS — G4709 Other insomnia: Secondary | ICD-10-CM

## 2021-05-21 DIAGNOSIS — E538 Deficiency of other specified B group vitamins: Secondary | ICD-10-CM

## 2021-05-21 DIAGNOSIS — I7789 Other specified disorders of arteries and arterioles: Secondary | ICD-10-CM

## 2021-05-21 DIAGNOSIS — G546 Phantom limb syndrome with pain: Secondary | ICD-10-CM | POA: Diagnosis not present

## 2021-05-21 MED ORDER — TRAZODONE HCL 50 MG PO TABS: 75.0000 mg | ORAL_TABLET | Freq: Every day | ORAL | 0 refills | Status: DC

## 2021-05-21 NOTE — Assessment & Plan Note (Addendum)
Stop for the rest of the year  In Jan can start taking once per week.  Follow up with lab testing at next physical

## 2021-05-21 NOTE — Patient Instructions (Signed)
Sleep hygiene  Try to go to bed at the same time every night Make your room as dark as possible for sleep Try to establish a bedtime routine before bed (shower, reading for a little bit, etc.) No TVs or phone for at least 40min-hour before bed. Avoid caffeine prior to bedtime.

## 2021-05-21 NOTE — Assessment & Plan Note (Signed)
Blood pressure is 132/84 today in office Continue current medications for management

## 2021-05-21 NOTE — Assessment & Plan Note (Addendum)
Encouraged smoking cessation Encouraged compliance with HTN medications CT chest ordered for monitoring and will send referral for Cardiovascular surgeon (in [place])

## 2021-05-21 NOTE — Assessment & Plan Note (Signed)
Continue Duloxetine and Gabapentin for pain management

## 2021-05-21 NOTE — Assessment & Plan Note (Addendum)
Continue Trelegy and Ventolin Prn for symptoms Will add spacer for inhalers to assist with deployment of medications.  Reviewed spacer use  Discussed smoking cessation plan and methods available to patient should he wish for assistance.

## 2021-05-22 ENCOUNTER — Other Ambulatory Visit: Payer: Self-pay | Admitting: Family Medicine

## 2021-05-22 DIAGNOSIS — F1021 Alcohol dependence, in remission: Secondary | ICD-10-CM

## 2021-06-08 ENCOUNTER — Emergency Department (HOSPITAL_COMMUNITY): Payer: 59

## 2021-06-08 ENCOUNTER — Encounter (HOSPITAL_COMMUNITY): Payer: Self-pay | Admitting: Emergency Medicine

## 2021-06-08 ENCOUNTER — Other Ambulatory Visit: Payer: Self-pay

## 2021-06-08 ENCOUNTER — Emergency Department (HOSPITAL_COMMUNITY)
Admission: EM | Admit: 2021-06-08 | Discharge: 2021-06-08 | Disposition: A | Discharge status: Home / Self Care | Payer: 59 | Attending: Emergency Medicine | Admitting: Emergency Medicine

## 2021-06-08 DIAGNOSIS — I129 Hypertensive chronic kidney disease with stage 1 through stage 4 chronic kidney disease, or unspecified chronic kidney disease: Secondary | ICD-10-CM | POA: Diagnosis not present

## 2021-06-08 DIAGNOSIS — R55 Syncope and collapse: Secondary | ICD-10-CM | POA: Diagnosis not present

## 2021-06-08 DIAGNOSIS — J069 Acute upper respiratory infection, unspecified: Secondary | ICD-10-CM | POA: Insufficient documentation

## 2021-06-08 DIAGNOSIS — Z7982 Long term (current) use of aspirin: Secondary | ICD-10-CM | POA: Insufficient documentation

## 2021-06-08 DIAGNOSIS — Z79899 Other long term (current) drug therapy: Secondary | ICD-10-CM | POA: Diagnosis not present

## 2021-06-08 DIAGNOSIS — Z87891 Personal history of nicotine dependence: Secondary | ICD-10-CM | POA: Diagnosis not present

## 2021-06-08 DIAGNOSIS — J988 Other specified respiratory disorders: Secondary | ICD-10-CM

## 2021-06-08 DIAGNOSIS — N183 Chronic kidney disease, stage 3 unspecified: Secondary | ICD-10-CM | POA: Diagnosis not present

## 2021-06-08 DIAGNOSIS — R059 Cough, unspecified: Secondary | ICD-10-CM | POA: Diagnosis present

## 2021-06-08 LAB — BASIC METABOLIC PANEL
Anion gap: 10 (ref 5–15)
BUN: 27 mg/dL — ABNORMAL HIGH (ref 8–23)
CO2: 20 mmol/L — ABNORMAL LOW (ref 22–32)
Calcium: 8.7 mg/dL — ABNORMAL LOW (ref 8.9–10.3)
Chloride: 107 mmol/L (ref 98–111)
Creatinine, Ser: 2.05 mg/dL — ABNORMAL HIGH (ref 0.61–1.24)
GFR, Estimated: 36 mL/min — ABNORMAL LOW (ref >60)
Glucose, Bld: 152 mg/dL — ABNORMAL HIGH (ref 70–99)
Potassium: 4.3 mmol/L (ref 3.5–5.1)
Sodium: 137 mmol/L (ref 135–145)

## 2021-06-08 LAB — TROPONIN I (HIGH SENSITIVITY): Troponin I (High Sensitivity): 3 ng/L (ref <18)

## 2021-06-08 LAB — CBC
HCT: 36.4 % — ABNORMAL LOW (ref 39.0–52.0)
Hemoglobin: 12 g/dL — ABNORMAL LOW (ref 13.0–17.0)
MCH: 30.8 pg (ref 26.0–34.0)
MCHC: 33 g/dL (ref 30.0–36.0)
MCV: 93.6 fL (ref 80.0–100.0)
Platelets: 255 10*3/uL (ref 150–400)
RBC: 3.89 MIL/uL — ABNORMAL LOW (ref 4.22–5.81)
RDW: 12.4 % (ref 11.5–15.5)
WBC: 7.5 10*3/uL (ref 4.0–10.5)
nRBC: 0 % (ref 0.0–0.2)

## 2021-06-08 MED ORDER — IPRATROPIUM-ALBUTEROL 0.5-2.5 (3) MG/3ML IN SOLN
3.0000 mL | RESPIRATORY_TRACT | Status: AC
  Administered 2021-06-08 (×3): 3 mL via RESPIRATORY_TRACT
  Filled 2021-06-08 (×2): qty 3
  Filled 2021-06-08: qty 6

## 2021-06-08 MED ORDER — PREDNISONE 20 MG PO TABS: ORAL_TABLET | ORAL | 0 refills | Status: DC

## 2021-06-08 MED ORDER — AEROCHAMBER PLUS FLO-VU LARGE MISC
1.0000 | Freq: Once | Status: AC
  Administered 2021-06-08: 1

## 2021-06-08 MED ORDER — PREDNISONE 20 MG PO TABS
60.0000 mg | ORAL_TABLET | Freq: Once | ORAL | Status: AC
  Administered 2021-06-08: 60 mg via ORAL
  Filled 2021-06-08: qty 3

## 2021-06-08 MED ORDER — ALBUTEROL SULFATE HFA 108 (90 BASE) MCG/ACT IN AERS
2.0000 | INHALATION_SPRAY | Freq: Once | RESPIRATORY_TRACT | Status: AC
  Administered 2021-06-08: 2 via RESPIRATORY_TRACT
  Filled 2021-06-08: qty 6.7

## 2021-06-08 NOTE — ED Notes (Signed)
The pt reports that he has sob and that he still smokes  no inhalers at home and  no machine.  Dry cough at present

## 2021-06-08 NOTE — ED Provider Notes (Addendum)
Emergency Medicine Provider Triage Evaluation Note  Ryan [name] , a 65 y.o. male  was evaluated in triage.  Pt complains of cough since last Saturday that he states is productive with yellow phlegm. Patient also states that he had a syncopal episode today with a loss of consciousness for less than one minute that was witnessed by friends. Patient endorses wheezing.  Review of Systems  Positive: SOB, CP, syncope, wheezing Negative: Abdominal pain, nausea, vomiting, diarrhea  Physical Exam  BP 125/86 (BP Location: Left Arm)    Pulse (!) 104    Temp 98.3 F (36.8 C) (Oral)    Resp 18    SpO2 96%  Gen:   Awake, no distress   Resp:  Normal effort, diffuse wheezes heard MSK:   Moves extremities without difficulty. Patient has R BKA  Other:    Medical Decision Making  Medically screening exam initiated at 2:58 PM.  Appropriate orders placed.  Ryan [name] was informed that the remainder of the evaluation will be completed by another provider, this initial triage assessment does not replace that evaluation, and the importance of remaining in the ED until their evaluation is complete.        Azucena Cecil, PA-C 06/08/21 1502    [place], Alvin Critchley, DO 06/14/21 1140

## 2021-06-08 NOTE — Discharge Instructions (Signed)
Use your inhaler every 4 hours(2-4 puffs) while awake, return for sudden worsening shortness of breath, or if you need to use your inhaler more often.   Eat and drink as well as you can for the next couple of days.

## 2021-06-08 NOTE — ED Provider Notes (Signed)
[facility] EMERGENCY DEPARTMENT Provider Note   CSN: [id number] Arrival date & time: 06/08/21  1434     History Chief Complaint  Patient presents with   Loss of Consciousness    Ryan [name] is a 65 y.o. male.  65 yo M with upper respiratory symptoms going on for about a week now.  Cough and congestion.  No fevers or chills.  Has some chest pain with coughing as well as has some subjective trouble breathing off and on.  He was having breakfast this morning and while he was taking a sip of coffee he had a coughing fit and felt lightheaded and then passed out.  He feels quite a bit better from that scenario.  Still having the cough and congestion.  He denies any significant headache or neck pain.  Has been able to eat and drink at home without much issue.  The history is provided by the patient.  Loss of Consciousness Episode history:  Single Most recent episode:  Today Duration:  1 minute Timing:  Rare Progression:  Resolved Chronicity:  New Context comment:  Coughing Witnessed: yes   Relieved by:  Nothing Worsened by:  Nothing Ineffective treatments:  None tried Associated symptoms: chest pain (with coughing)   Associated symptoms: no confusion, no fever, no headaches, no palpitations, no shortness of breath and no vomiting       Past Medical History:  Diagnosis Date   CKD (chronic kidney disease)    History of blood transfusion    Hyperlipidemia    Hypertension    Neuromuscular disorder (HCC)    numbness feet   Peripheral vascular disease (HCC)    Shortness of breath dyspnea    occasional     Patient Active Problem List   Diagnosis Date Noted   Phantom limb pain ([place])    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   Iron deficiency anemia 11/23/2018   Stage 3 chronic kidney disease ([place]) 10/21/2018   Atherosclerosis of native arteries of extremity with rest pain ([place]) 10/13/2018   PAD (peripheral artery disease)  ([place]) 07/21/2018   Claudication ([place]) 03/17/2018   B12 deficiency 03/12/2018   Vitamin B1 deficiency 03/12/2018   Varicose veins of leg with pain, right 03/12/2018   Enlarged thoracic aorta ([place]) 02/24/2018   Alcoholism /alcohol abuse 02/24/2018   Paresthesia 02/24/2018   Ectatic aorta ([place]) 02/20/2018   Emphysema lung ([place]) 02/20/2018   Atherosclerosis of aorta ([place]) 02/20/2018   Hypertension, benign 08/31/2015   Tobacco use 08/31/2015    Past Surgical History:  Procedure Laterality Date   ABDOMINAL AORTOGRAM W/LOWER EXTREMITY N/A 09/13/2019   Procedure: ABDOMINAL AORTOGRAM W/LOWER EXTREMITY;  Surgeon: Waynetta Sandy, MD;  Location: [place] [facility];  Service: Cardiovascular;  Laterality: N/A;   AMPUTATION Right 05/09/2020   Procedure: RIGHT BELOW KNEE AMPUTATION;  Surgeon: Waynetta Sandy, MD;  Location: [place];  Service: Vascular;  Laterality: Right;  w/ a block   COLONOSCOPY WITH PROPOFOL N/A 02/15/2016   Procedure: COLONOSCOPY WITH PROPOFOL;  Surgeon: Lucilla Lame, MD;  Location: [place];  Service: Endoscopy;  Laterality: N/A;   FEMORAL-POPLITEAL BYPASS GRAFT Right 09/14/2019   Procedure: BYPASS GRAFT FEMORAL-POPLITEAL ARTERY;  Surgeon: Waynetta Sandy, MD;  Location: [place];  Service: Vascular;  Laterality: Right;   HERNIA REPAIR  1999   left inguinal   INSERTION OF ILIAC STENT Right 09/14/2019   Procedure: Insertion Of Common and External Iliac Stent;  Surgeon: [name],  Georgia Dom, MD;  Location: [place];  Service: Vascular;  Laterality: Right;   LOWER EXTREMITY ANGIOGRAPHY Right 05/07/2018   Procedure: LOWER EXTREMITY ANGIOGRAPHY;  Surgeon: Algernon Huxley, MD;  Location: [place] [facility];  Service: Cardiovascular;  Laterality: Right;   LOWER EXTREMITY ANGIOGRAPHY Right 06/25/2018   Procedure: LOWER EXTREMITY ANGIOGRAPHY;  Surgeon: Algernon Huxley, MD;  Location: [place] [facility];  Service: Cardiovascular;  Laterality: Right;   LOWER  EXTREMITY ANGIOGRAPHY Right 07/01/2018   Procedure: LOWER EXTREMITY ANGIOGRAPHY;  Surgeon: Algernon Huxley, MD;  Location: [place] [facility];  Service: Cardiovascular;  Laterality: Right;   LOWER EXTREMITY ANGIOGRAPHY Right 08/12/2018   Procedure: LOWER EXTREMITY ANGIOGRAPHY;  Surgeon: Algernon Huxley, MD;  Location: [place] [facility];  Service: Cardiovascular;  Laterality: Right;   LOWER EXTREMITY ANGIOGRAPHY Right 09/03/2018   Procedure: LOWER EXTREMITY ANGIOGRAPHY;  Surgeon: Algernon Huxley, MD;  Location: [place] [facility];  Service: Cardiovascular;  Laterality: Right;   LOWER EXTREMITY ANGIOGRAPHY Right 09/04/2018   Procedure: Lower Extremity Angiography;  Surgeon: Algernon Huxley, MD;  Location: [place] [facility];  Service: Cardiovascular;  Laterality: Right;   LOWER EXTREMITY ANGIOGRAPHY Right 10/01/2018   Procedure: LOWER EXTREMITY ANGIOGRAPHY;  Surgeon: Algernon Huxley, MD;  Location: [place] [facility];  Service: Cardiovascular;  Laterality: Right;   LOWER EXTREMITY ANGIOGRAPHY Right 10/01/2018   Procedure: Lower Extremity Angiography;  Surgeon: Algernon Huxley, MD;  Location: [place] [facility];  Service: Cardiovascular;  Laterality: Right;   LOWER EXTREMITY ANGIOGRAPHY Right 10/15/2018   Procedure: LOWER EXTREMITY ANGIOGRAPHY;  Surgeon: Algernon Huxley, MD;  Location: [place] [facility];  Service: Cardiovascular;  Laterality: Right;   LOWER EXTREMITY ANGIOGRAPHY Left 10/16/2018   Procedure: Lower Extremity Angiography;  Surgeon: Algernon Huxley, MD;  Location: [place] [facility];  Service: Cardiovascular;  Laterality: Left;   LOWER EXTREMITY ANGIOGRAPHY Left 01/20/2019   Procedure: LOWER EXTREMITY ANGIOGRAPHY;  Surgeon: Algernon Huxley, MD;  Location: [place] [facility];  Service: Cardiovascular;  Laterality: Left;   LOWER EXTREMITY ANGIOGRAPHY Right 01/21/2019   Procedure: Lower Extremity Angiography;  Surgeon: Algernon Huxley, MD;  Location: [place] [facility];  Service: Cardiovascular;  Laterality:  Right;   LOWER EXTREMITY ANGIOGRAPHY Right 01/28/2019   Procedure: LOWER EXTREMITY ANGIOGRAPHY;  Surgeon: Algernon Huxley, MD;  Location: [place] [facility];  Service: Cardiovascular;  Laterality: Right;   LOWER EXTREMITY ANGIOGRAPHY Right 01/29/2019   Procedure: Lower Extremity Angiography;  Surgeon: Algernon Huxley, MD;  Location: [place] [facility];  Service: Cardiovascular;  Laterality: Right;   LOWER EXTREMITY ANGIOGRAPHY Right 04/04/2020   Procedure: LOWER EXTREMITY ANGIOGRAPHY;  Surgeon: Waynetta Sandy, MD;  Location: [place] [facility];  Service: Cardiovascular;  Laterality: Right;   LOWER EXTREMITY INTERVENTION Right 07/02/2018   Procedure: LOWER EXTREMITY INTERVENTION;  Surgeon: Algernon Huxley, MD;  Location: [place] [facility];  Service: Cardiovascular;  Laterality: Right;   PERIPHERAL VASCULAR BALLOON ANGIOPLASTY Left 04/04/2020   Procedure: PERIPHERAL VASCULAR BALLOON ANGIOPLASTY;  Surgeon: Waynetta Sandy, MD;  Location: [place] [facility];  Service: Cardiovascular;  Laterality: Left;  external iliac   POLYPECTOMY N/A 02/15/2016   Procedure: POLYPECTOMY;  Surgeon: Lucilla Lame, MD;  Location: [place];  Service: Endoscopy;  Laterality: N/A;       Family History  Problem Relation Age of Onset   COPD Mother    Hypertension Father    Heart attack  Brother     Social History   Tobacco Use   Smoking status: Former    Packs/day: 0.50    Years: 40.00    Pack years: 20.00    Types: Cigarettes    Start date: 07/21/1978    Quit date: 12/2018    Years since quitting: 2.4   Smokeless tobacco: Never  Vaping Use   Vaping Use: Never used  Substance Use Topics   Alcohol use: Not Currently    Alcohol/week: 12.0 standard drinks    Types: 12 Cans of beer per week    Comment: 4 beers / week now per pt 05/05/20   Drug use: Never    Home Medications Prior to Admission medications   Medication Sig Start Date End Date Taking? Authorizing Provider  predniSONE  (DELTASONE) 20 MG tablet 2 tabs po daily x 4 days 06/08/21  Yes Deno Etienne, DO  albuterol (VENTOLIN HFA) 108 (90 Base) MCG/ACT inhaler Inhale 2 puffs into the lungs every 6 (six) hours as needed for wheezing or shortness of breath. 10/18/20   Sowles, Drue Stager, MD  amLODipine-valsartan (EXFORGE) 5-160 MG tablet Take 1 tablet by mouth daily. 10/18/20   Steele Sizer, MD  ascorbic acid (VITAMIN C) 500 MG tablet Take 1 tablet (500 mg total) by mouth daily. 05/25/20   Love, Ivan Anchors, PA-C  aspirin EC 81 MG tablet Take 1 tablet (81 mg total) by mouth daily. 08/13/18   Stegmayer, Joelene Millin A, PA-C  atorvastatin (LIPITOR) 20 MG tablet Take 1 tablet (20 mg total) by mouth daily. 10/18/20   Steele Sizer, MD  Cholecalciferol (DIALYVITE VITAMIN D 5000) 125 MCG (5000 UT) capsule Take 5,000 Units by mouth daily.    [provider]  clopidogrel (PLAVIX) 75 MG tablet Take 1 tablet (75 mg total) by mouth daily. 10/18/20   Steele Sizer, MD  Cyanocobalamin (B-12) 500 MCG SUBL Place 1 tablet under the tongue daily. Patient taking differently: Place 500 mcg under the tongue daily. 03/14/20   Steele Sizer, MD  DULoxetine (CYMBALTA) 60 MG capsule Take 1 capsule (60 mg total) by mouth daily. 10/18/20   Steele Sizer, MD  Fluticasone-Umeclidin-Vilant (TRELEGY ELLIPTA) 100-62.5-25 MCG/INH AEPB Inhale 1 puff into the lungs daily as needed (shortness of breath). 10/18/20   Steele Sizer, MD  gabapentin (NEURONTIN) 100 MG capsule Take 1 capsule (100 mg total) by mouth 3 (three) times daily. 10/18/20   Steele Sizer, MD  Multiple Vitamin (MULTIVITAMIN WITH MINERALS) TABS tablet Take 1 tablet by mouth daily. 05/25/20   Love, Ivan Anchors, PA-C  pantoprazole (PROTONIX) 20 MG tablet Take 1 tablet by mouth once daily 05/22/21   Steele Sizer, MD  traZODone (DESYREL) 50 MG tablet Take 1.5 tablets (75 mg total) by mouth at bedtime. 05/21/21   Steele Sizer, MD    Allergies    Patient has no known allergies.  Review of  Systems   Review of Systems  Constitutional:  Negative for chills and fever.  HENT:  Positive for congestion. Negative for facial swelling.   Eyes:  Negative for discharge and visual disturbance.  Respiratory:  Positive for cough. Negative for shortness of breath.   Cardiovascular:  Positive for chest pain (with coughing) and syncope. Negative for palpitations.  Gastrointestinal:  Negative for abdominal pain, diarrhea and vomiting.  Musculoskeletal:  Negative for arthralgias and myalgias.  Skin:  Negative for color change and rash.  Neurological:  Negative for tremors, syncope and headaches.  Psychiatric/Behavioral:  Negative for confusion and dysphoric mood.  Physical Exam Updated Vital Signs BP (!) 142/81    Pulse 88    Temp 98.3 F (36.8 C) (Oral)    Resp 14    SpO2 94%   Physical Exam Vitals and nursing note reviewed.  Constitutional:      Appearance: He is well-developed.  HENT:     Head: Normocephalic and atraumatic.  Eyes:     Pupils: Pupils are equal, round, and reactive to light.  Neck:     Vascular: No JVD.  Cardiovascular:     Rate and Rhythm: Normal rate and regular rhythm.     Heart sounds: No murmur heard.   No friction rub. No gallop.  Pulmonary:     Effort: No respiratory distress.     Breath sounds: Wheezing (diffuse with prolonged expiration) present.  Abdominal:     General: There is no distension.     Tenderness: There is no abdominal tenderness. There is no guarding or rebound.  Musculoskeletal:        General: Normal range of motion.     Cervical back: Normal range of motion and neck supple.  Skin:    Coloration: Skin is not pale.     Findings: No rash.  Neurological:     Mental Status: He is alert and oriented to person, place, and time.  Psychiatric:        Behavior: Behavior normal.    ED Results / Procedures / Treatments   Labs (all labs ordered are listed, but only abnormal results are displayed) Labs Reviewed  BASIC METABOLIC PANEL -  Abnormal; Notable for the following components:      Result Value   CO2 20 (*)    Glucose, Bld 152 (*)    BUN 27 (*)    Creatinine, Ser 2.05 (*)    Calcium 8.7 (*)    GFR, Estimated 36 (*)    All other components within normal limits  CBC - Abnormal; Notable for the following components:   RBC 3.89 (*)    Hemoglobin 12.0 (*)    HCT 36.4 (*)    All other components within normal limits  TROPONIN I (HIGH SENSITIVITY)  TROPONIN I (HIGH SENSITIVITY)    EKG EKG Interpretation  Date/Time:  Friday June 08 2021 14:51:27 EST Ventricular Rate:  101 PR Interval:  148 QRS Duration: 78 QT Interval:  330 QTC Calculation: 427 R Axis:   41 Text Interpretation: Sinus tachycardia Otherwise normal ECG No significant change since last tracing Confirmed by Deno Etienne [phone]) on 06/08/2021 4:03:00 PM  Radiology DG Chest 2 View  Result Date: 06/08/2021 CLINICAL DATA:  Chest pain. EXAM: CHEST - 2 VIEW COMPARISON:  Chest x-ray May 01, 2021. CT scan March 16, 2020. FINDINGS: Calcified pleural plaques in the left hemithorax are stable. Blunting of left costophrenic angle is stable. No interval change on the left. The cardiomediastinal silhouette is stable. No pneumothorax. Bullous changes in the left apex. The right lung is clear. No other acute abnormalities. IMPRESSION: No acute interval change. Calcified pleural plaques on the left, unchanged since at least June of 2021. Electronically Signed   By: Dorise Bullion III M.D.   On: 06/08/2021 15:29    Procedures Procedures   Medications Ordered in ED Medications  albuterol (VENTOLIN HFA) 108 (90 Base) MCG/ACT inhaler 2 puff (has no administration in time range)  AeroChamber Plus Flo-Vu Large MISC 1 each (has no administration in time range)  ipratropium-albuterol (DUONEB) 0.5-2.5 (3) MG/3ML nebulizer solution 3 mL (3 mLs  Nebulization Given 06/08/21 1743)  predniSONE (DELTASONE) tablet 60 mg (60 mg Oral Given 06/08/21 1719)    ED  Course  I have reviewed the triage vital signs and the nursing notes.  Pertinent labs & imaging results that were available during my care of the patient were reviewed by me and considered in my medical decision making (see chart for details).    MDM Rules/Calculators/A&P                         65 yo M with a chief complaint of a syncopal event today.  Patient has had URI-like symptoms for the past week.  Had a coughing event when he aspirated on some coffee.  Syncopized with this.  Most likely vasovagal by history.  I doubt that he is PE based on his history.  He is wheezing on exam.  We will give 3 duo nebs back-to-back and steroids.  Reassess.  Patient feeling much better after 3 duo nebs and steroids.  Will discharge home.  Burst of steroids.  6:28 PM:  I have discussed the diagnosis/risks/treatment options with the patient and believe the pt to be eligible for discharge home to follow-up with PCP. We also discussed returning to the ED immediately if new or worsening sx occur. We discussed the sx which are most concerning (e.g., sudden worsening pain, fever, inability to tolerate by mouth, persistent cp, sob needing albuterol more often than every 4 hours) that necessitate immediate return. Medications administered to the patient during their visit and any new prescriptions provided to the patient are listed below.  Medications given during this visit Medications  albuterol (VENTOLIN HFA) 108 (90 Base) MCG/ACT inhaler 2 puff (has no administration in time range)  AeroChamber Plus Flo-Vu Large MISC 1 each (has no administration in time range)  ipratropium-albuterol (DUONEB) 0.5-2.5 (3) MG/3ML nebulizer solution 3 mL (3 mLs Nebulization Given 06/08/21 1743)  predniSONE (DELTASONE) tablet 60 mg (60 mg Oral Given 06/08/21 1719)     The patient appears reasonably screen and/or stabilized for discharge and I doubt any other medical condition or other [facility] requiring further screening, evaluation,  or treatment in the ED at this time prior to discharge.      Final Clinical Impression(s) / ED Diagnoses Final diagnoses:  Syncope and collapse  Wheezing-associated respiratory infection (WARI)    Rx / DC Orders ED Discharge Orders          Ordered    predniSONE (DELTASONE) 20 MG tablet        06/08/21 1803             Deno Etienne, DO 06/08/21 1828

## 2021-06-08 NOTE — ED Triage Notes (Signed)
Patient here after losing consciousness for approximately 10-15 seconds after choking on a sip of coffee this morning. Patient denies having any complaints and currently reports feeling at his baseline. Patient alert, oriented, and in no apparent distress at this time.

## 2021-06-15 IMAGING — CT CT CHEST LUNG CANCER SCREENING LOW DOSE W/O CM
2 of 5 series · 15 of 40 positions shown, 18 images · non-contrast
Comparison: 03/04/2019

CLINICAL DATA: 63-year-old male with 34 pack-year history of
smoking. Lung cancer screening.

EXAM:
CT CHEST WITHOUT CONTRAST LOW-DOSE FOR LUNG CANCER SCREENING
TECHNIQUE: Multidetector CT imaging of the chest was performed following the
standard protocol without IV contrast.

[Series 3: lung 1.00 · axial · 0.65mm/px · z∈[+1451,+1794]mm · 12 of 379 slices shown, 15 images]
[im 18/379  mediastinal]
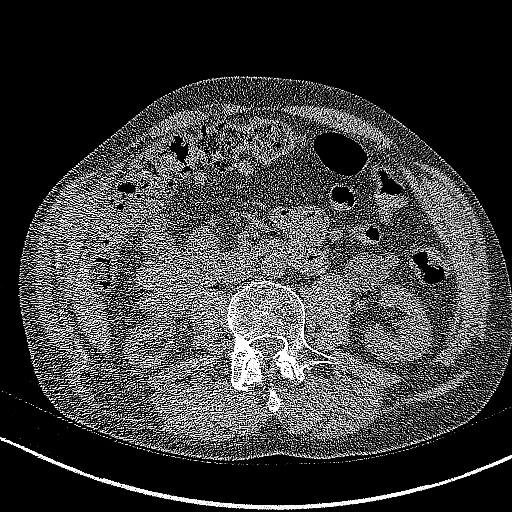
[im 18/379  lung]
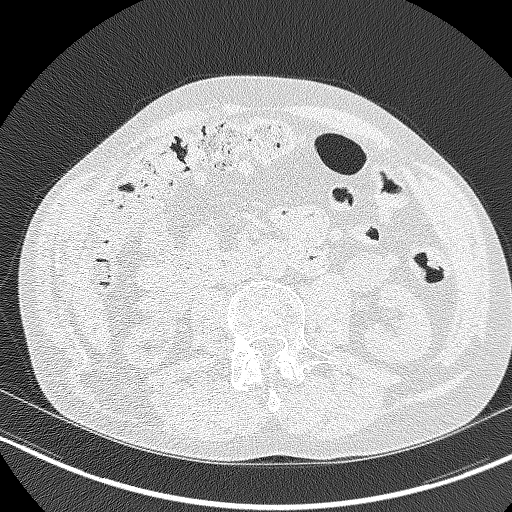
[im 52/379  lung]
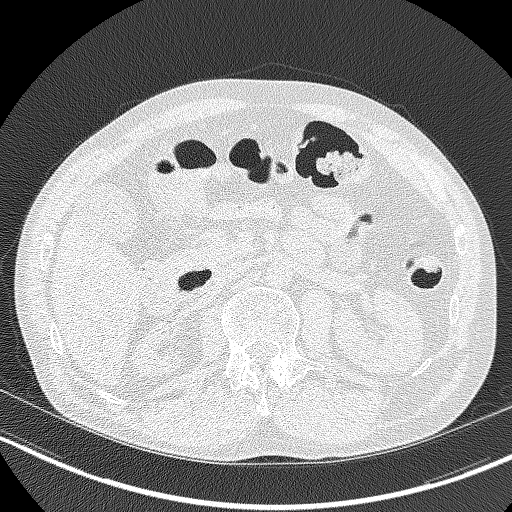
[im 86/379  lung]
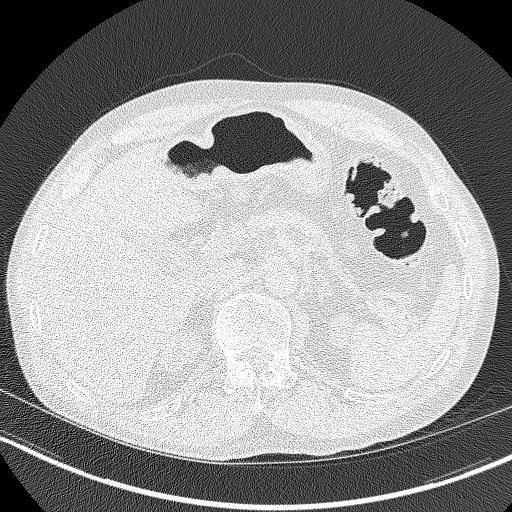
[im 121/379  lung]
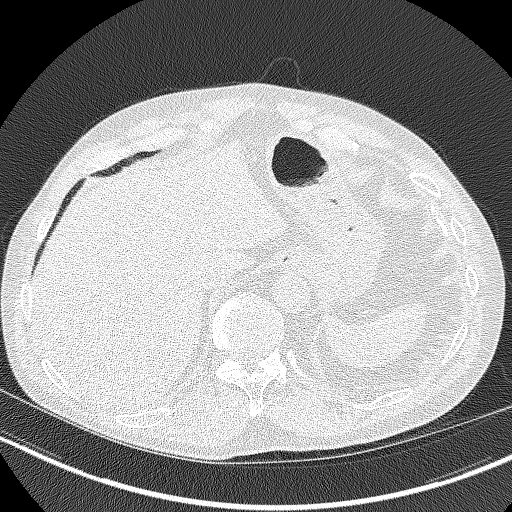
[im 138/379  mediastinal]
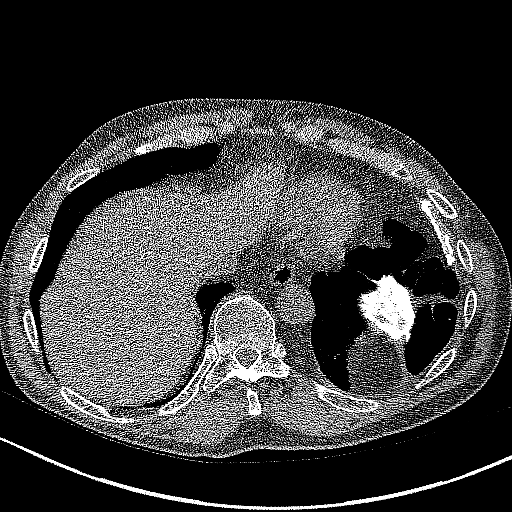
[im 138/379  lung]
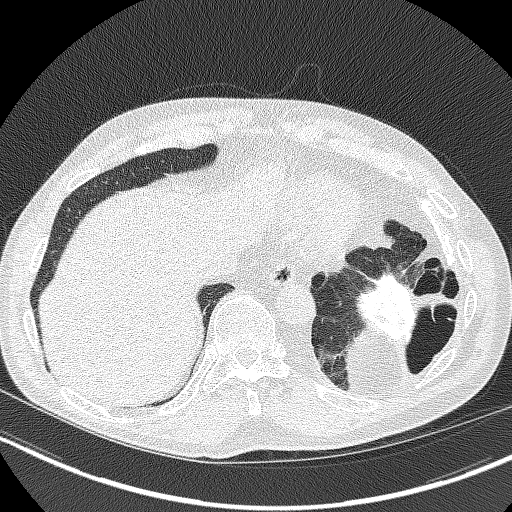
[im 172/379  lung]
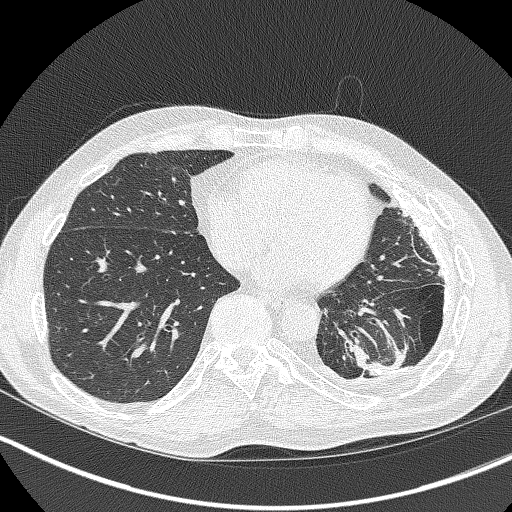
[im 207/379  lung]
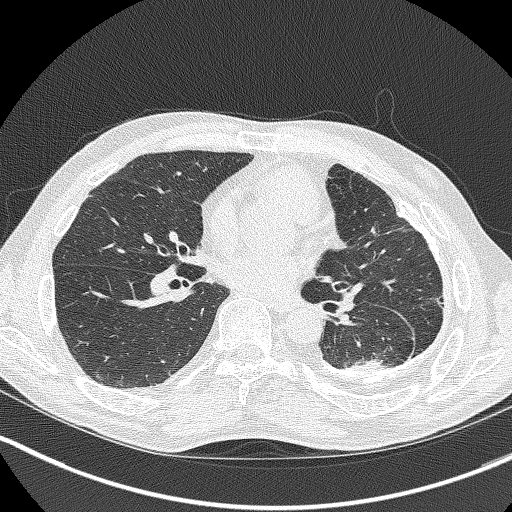
[im 241/379  lung]
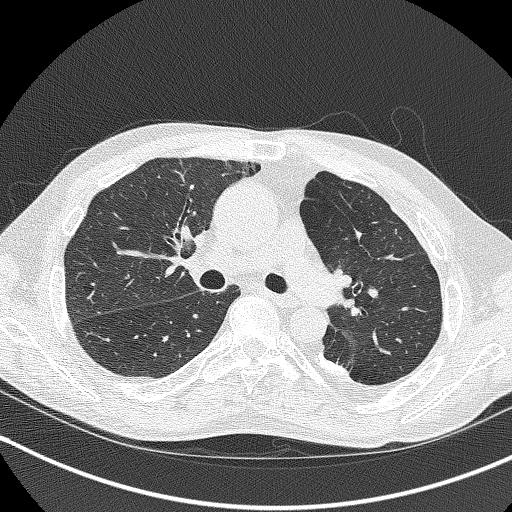
[im 258/379  mediastinal]
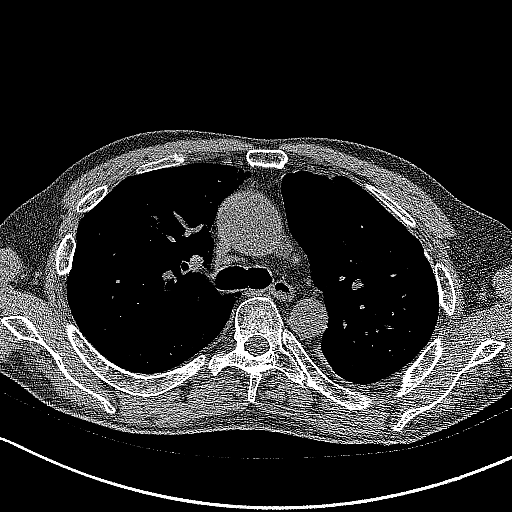
[im 258/379  lung]
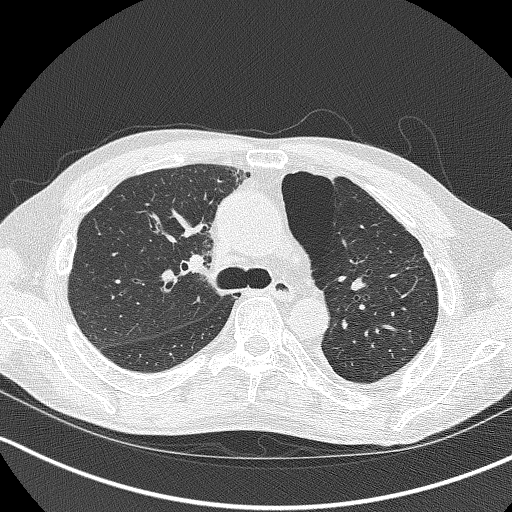
[im 293/379  lung]
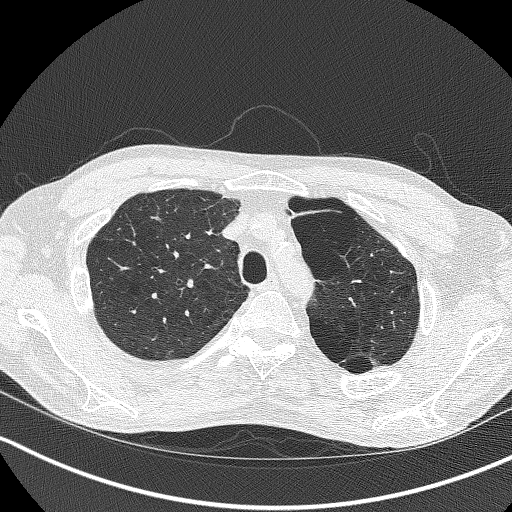
[im 327/379  lung]
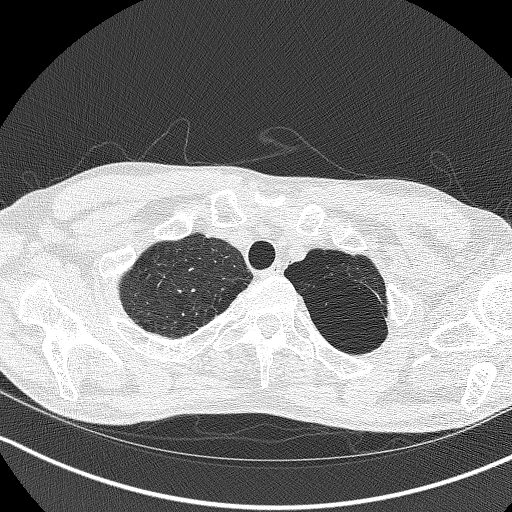
[im 361/379  lung]
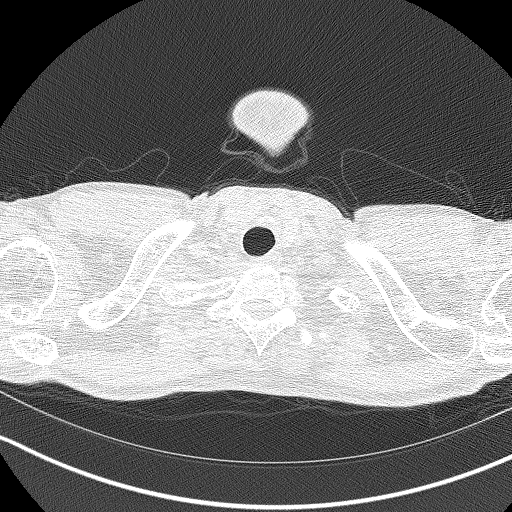

[Series 4: coronals lung 1.00 cor · coronal · 0.65mm/px · 3 of 253 slices shown]
[im 51/253  lung]
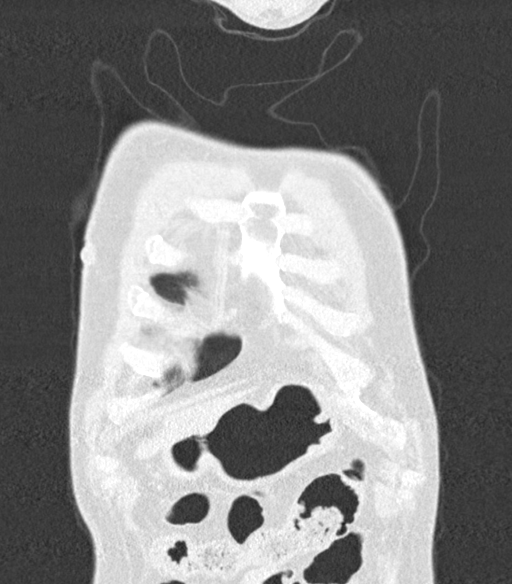
[im 101/253  lung]
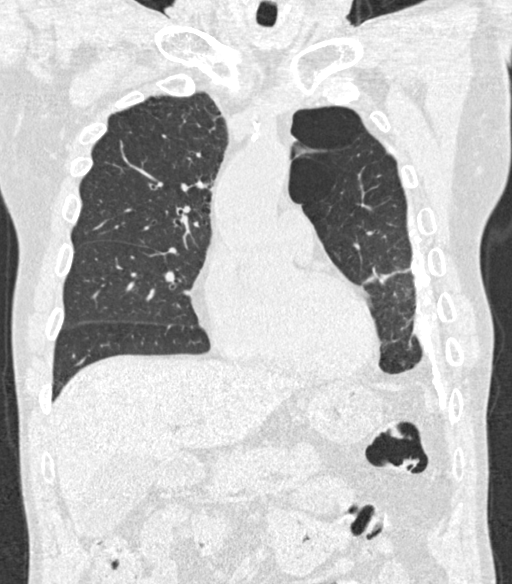
[im 152/253  lung]
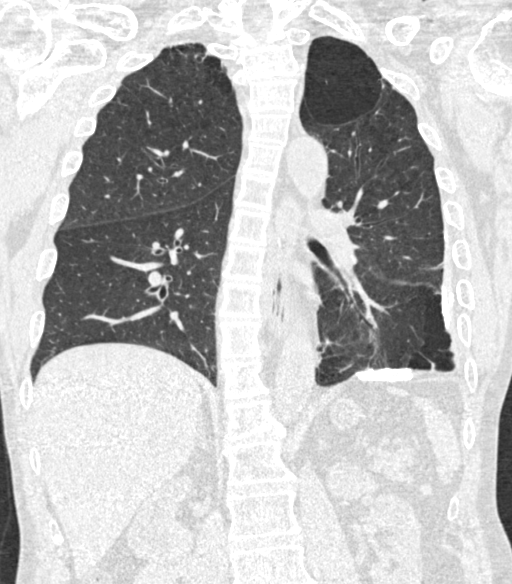

[15 of 40 positions shown; findings below may reference images not displayed]

FINDINGS: Cardiovascular: The heart size is normal. No substantial pericardial
effusion. Ascending thoracic aorta measures 4.1 cm diameter.
Atherosclerotic calcification is noted in the wall of the thoracic
aorta.

Mediastinum/Nodes: No mediastinal lymphadenopathy. No evidence for
gross hilar lymphadenopathy although assessment is limited by the
lack of intravenous contrast on today's study. The esophagus has
normal imaging features. There is no axillary lymphadenopathy.

Lungs/Pleura: Volume loss left hemithorax is stable with dense
calcified pleural plaque in the lower left hemithorax and areas of
architectural distortion and scarring at the left base. The stable
findings may reflect prior empyema or hemothorax. Centrilobular and
paraseptal emphysema evident. Bullous change noted in the left lung
apex. No suspicious pulmonary nodule or mass. No focal airspace
consolidation. No pleural effusion.

Upper Abdomen: Unremarkable.

Musculoskeletal: No worrisome lytic or sclerotic osseous
abnormality.
IMPRESSION: 1. Lung-RADS 1, negative. Continue annual screening with low-dose
chest CT without contrast in 12 months.
2. Aortic Atherosclerosis (61887-3HD.D) and Emphysema (61887-2IC.U).

## 2021-06-18 ENCOUNTER — Other Ambulatory Visit: Payer: Self-pay | Admitting: Family Medicine

## 2021-06-18 DIAGNOSIS — G546 Phantom limb syndrome with pain: Secondary | ICD-10-CM

## 2021-06-18 DIAGNOSIS — I739 Peripheral vascular disease, unspecified: Secondary | ICD-10-CM

## 2021-07-16 ENCOUNTER — Ambulatory Visit (HOSPITAL_COMMUNITY): Payer: 59

## 2021-07-17 ENCOUNTER — Ambulatory Visit: Payer: Self-pay

## 2021-07-17 NOTE — Telephone Encounter (Signed)
Patient called, left VM to return the call to the office to discuss medication with a nurse.   Summary: Medication question.   Pt wanting to know if he should continue taking medication DULoxetine (CYMBALTA) 60 MG capsule. Pt stated this is for depression and anxiety and is unsure if he should continue taking it.   Seeking clinical advice

## 2021-07-17 NOTE — Telephone Encounter (Signed)
Second attempt to call patient- left message to call office back

## 2021-07-17 NOTE — Telephone Encounter (Signed)
Pt called, left VM. Unable to reach patient after 3 attempts by [facility] NT, routing to the provider for resolution per protocol.

## 2021-07-20 ENCOUNTER — Other Ambulatory Visit: Payer: Self-pay | Admitting: Family Medicine

## 2021-07-20 DIAGNOSIS — I10 Essential (primary) hypertension: Secondary | ICD-10-CM

## 2021-07-20 DIAGNOSIS — I739 Peripheral vascular disease, unspecified: Secondary | ICD-10-CM

## 2021-07-20 DIAGNOSIS — I129 Hypertensive chronic kidney disease with stage 1 through stage 4 chronic kidney disease, or unspecified chronic kidney disease: Secondary | ICD-10-CM

## 2021-07-20 DIAGNOSIS — G546 Phantom limb syndrome with pain: Secondary | ICD-10-CM

## 2021-07-20 DIAGNOSIS — I700 Atherosclerosis of aorta: Secondary | ICD-10-CM

## 2021-07-20 DIAGNOSIS — N183 Chronic kidney disease, stage 3 unspecified: Secondary | ICD-10-CM

## 2021-07-20 NOTE — Telephone Encounter (Signed)
Requested Prescriptions  Pending Prescriptions Disp Refills   amLODipine-valsartan (EXFORGE) 5-160 MG tablet [Pharmacy Med Name: amLODIPine Besylate-Valsartan 5-160 MG Oral Tablet] 90 tablet 0    Sig: Take 1 tablet by mouth once daily     Cardiovascular: CCB + ARB Combos Failed - 07/20/2021  9:26 AM      Failed - Cr in normal range and within 180 days    Creat  Date Value Ref Range Status  01/17/2021 1.57 (H) 0.70 - 1.35 mg/dL Final   Creatinine, Ser  Date Value Ref Range Status  06/08/2021 2.05 (H) 0.61 - 1.24 mg/dL Final   Creatinine, Urine  Date Value Ref Range Status  01/17/2021 114 20 - 320 mg/dL Final         Failed - Last BP in normal range    BP Readings from Last 1 Encounters:  06/08/21 (!) 142/81         Passed - K in normal range and within 180 days    Potassium  Date Value Ref Range Status  06/08/2021 4.3 3.5 - 5.1 mmol/L Final         Passed - Patient is not pregnant      Passed - Valid encounter within last 6 months    Recent Outpatient Visits          2 months ago Need for immunization against influenza   [facility] Steele Sizer, MD   6 months ago PAD (peripheral artery disease) [facility])   [facility] Steele Sizer, MD   9 months ago PAD (peripheral artery disease) [facility])   [facility] Steele Sizer, MD   1 year ago PAD (peripheral artery disease) [facility])   [facility] Steele Sizer, MD   1 year ago History of amputation below knee, right [facility])   [facility] Steele Sizer, MD      Future Appointments            In 2 months Ancil Boozer, Drue Stager, MD [facility]            atorvastatin (LIPITOR) 20 MG tablet [Pharmacy Med Name: Atorvastatin Calcium 20 MG Oral Tablet] 90 tablet 0    Sig: Take 1 tablet by mouth once daily     Cardiovascular:  Antilipid - Statins Passed - 07/20/2021  9:26 AM      Passed - Total Cholesterol  in normal range and within 360 days    Cholesterol, Total  Date Value Ref Range Status  08/31/2015 180 100 - 199 mg/dL Final   Cholesterol  Date Value Ref Range Status  01/17/2021 132 <200 mg/dL Final         Passed - LDL in normal range and within 360 days    LDL Cholesterol (Calc)  Date Value Ref Range Status  01/17/2021 72 mg/dL (calc) Final    Comment:    Reference range: <100 . Desirable range <100 mg/dL for primary prevention;   <70 mg/dL for patients with CHD or diabetic patients  with > or = 2 CHD risk factors. Marland Kitchen LDL-C is now calculated using the Martin-Hopkins  calculation, which is a validated novel method providing  better accuracy than the Friedewald equation in the  estimation of LDL-C.  Cresenciano Genre et al. Annamaria Helling. 6440;347(42): 2061-2068  (http://education.[employer].com/faq/FAQ164)          Passed - HDL in normal range and within 360 days    HDL  Date Value Ref Range  Status  01/17/2021 46 > OR = 40 mg/dL Final  08/31/2015 98 >39 mg/dL Final         Passed - Triglycerides in normal range and within 360 days    Triglycerides  Date Value Ref Range Status  01/17/2021 59 <150 mg/dL Final         Passed - Patient is not pregnant      Passed - Valid encounter within last 12 months    Recent Outpatient Visits          2 months ago Need for immunization against influenza   [facility] Steele Sizer, MD   6 months ago PAD (peripheral artery disease) [facility])   [facility] Steele Sizer, MD   9 months ago PAD (peripheral artery disease) [facility])   [facility] Steele Sizer, MD   1 year ago PAD (peripheral artery disease) [facility])   [facility] Steele Sizer, MD   1 year ago History of amputation below knee, right [facility])   [facility] Steele Sizer, MD      Future Appointments            In 2 months Ancil Boozer, Drue Stager, MD [facility],  [facility]            gabapentin (NEURONTIN) 100 MG capsule [Pharmacy Med Name: Gabapentin 100 MG Oral Capsule] 270 capsule 0    Sig: TAKE 1 CAPSULE BY MOUTH THREE TIMES DAILY     Neurology: Anticonvulsants - gabapentin Passed - 07/20/2021  9:26 AM      Passed - Valid encounter within last 12 months    Recent Outpatient Visits          2 months ago Need for immunization against influenza   [facility] Steele Sizer, MD   6 months ago PAD (peripheral artery disease) [facility])   [facility] Steele Sizer, MD   9 months ago PAD (peripheral artery disease) [facility])   [facility] Steele Sizer, MD   1 year ago PAD (peripheral artery disease) [facility])   [facility] Steele Sizer, MD   1 year ago History of amputation below knee, right [facility])   [facility] Steele Sizer, MD      Future Appointments            In 2 months Ancil Boozer, Drue Stager, MD [facility]

## 2021-07-23 ENCOUNTER — Ambulatory Visit (HOSPITAL_COMMUNITY): Payer: 59

## 2021-07-25 ENCOUNTER — Telehealth: Payer: Self-pay

## 2021-07-25 NOTE — Telephone Encounter (Signed)
I called this patient to inform him that he has been scheduled to have his CT scan on 08/03/21 @ 1:30pm at [facility]. Patient was instructed not to have anything but liquids 4 hours prior (basically liquids only after 9:30). Patient expressed verbal understanding and said thanks.   [employer]   Auth # [id number]  Valid 07/23/21 - 10/21/21.

## 2021-08-03 ENCOUNTER — Other Ambulatory Visit: Payer: Self-pay | Admitting: Family Medicine

## 2021-08-03 ENCOUNTER — Other Ambulatory Visit: Payer: Self-pay

## 2021-08-03 ENCOUNTER — Ambulatory Visit (HOSPITAL_COMMUNITY)
Admission: RE | Admit: 2021-08-03 | Discharge: 2021-08-03 | Disposition: A | Discharge status: Home / Self Care | Payer: 59 | Source: Ambulatory Visit | Attending: Family Medicine | Admitting: Family Medicine

## 2021-08-03 DIAGNOSIS — I7121 Aneurysm of the ascending aorta, without rupture: Secondary | ICD-10-CM | POA: Diagnosis present

## 2021-08-03 LAB — POCT I-STAT CREATININE: Creatinine, Ser: 1.5 mg/dL — ABNORMAL HIGH (ref 0.61–1.24)

## 2021-08-03 MED ORDER — SODIUM CHLORIDE (PF) 0.9 % IJ SOLN
INTRAMUSCULAR | Status: AC
  Filled 2021-08-03: qty 50

## 2021-08-03 MED ORDER — IOHEXOL 350 MG/ML SOLN
100.0000 mL | Freq: Once | INTRAVENOUS | Status: AC | PRN
  Administered 2021-08-03: 100 mL via INTRAVENOUS

## 2021-08-14 ENCOUNTER — Telehealth: Payer: Self-pay | Admitting: Family Medicine

## 2021-08-14 NOTE — Telephone Encounter (Signed)
Pt is scheduled for a virtual tomorrow with Dr Ancil Boozer

## 2021-08-14 NOTE — Telephone Encounter (Signed)
Pt called in to request a antibiotic from provider. Pt says that he is still coughing up yellow phlym. Pt says that he was seen at the ED and prescribed a antibiotic. Pt would like to know if provider would refill for predniSONE (DELTASONE) 20 MG tablet ?     Pharmacy:  [place] ([place]), [place] - [address] Phone:  [phone]  Fax:  [phone]

## 2021-08-14 NOTE — Progress Notes (Signed)
Name: Ryan [name]   MRN: [id number]    DOB: 1956/06/02   Date:08/14/2021       Progress Note  Subjective  Chief Complaint  Cough  I connected with  [name]  on 08/14/21 at 10:20 AM EST by a video enabled telemedicine application and verified that I am speaking with the correct person using two identifiers.  I discussed the limitations of evaluation and management by telemedicine and the availability of in person appointments. The patient expressed understanding and agreed to proceed with the virtual visit  Staff also discussed with the patient that there may be a patient responsible charge related to this service. Patient Location: at home  Provider Location: [facility] Additional Individuals present: alone   HPI  URI : he states symptoms started 4 days ago with chest congestion and a productive cough. Denies sore throat, rhinorrhea, nasal congestion. He states the cough is productive yellowish in color, he has stable SOB with moderate activity. No fever or chills. He states feeling better today . Normal appetite , no nausea , vomiting or diarrhea. Mild fatigue. He has noticed some wheezing    Phantom pain: he wants to come off of duloxetine, explained we gave it to him for pain, he would like to try cutting anyways, discussed how to wean self off slowly   Patient Active Problem List   Diagnosis Date Noted   Phantom limb pain ([place])    Below-knee amputation of right lower extremity ([place]) 05/17/2020   Iron deficiency anemia 11/23/2018   Stage 3 chronic kidney disease ([place]) 10/21/2018   Atherosclerosis of native arteries of extremity with rest pain ([place]) 10/13/2018   PAD (peripheral artery disease) ([place]) 07/21/2018   Claudication ([place]) 03/17/2018   B12 deficiency 03/12/2018   Vitamin B1 deficiency 03/12/2018   Varicose veins of leg with pain, right 03/12/2018   Enlarged thoracic aorta ([place]) 02/24/2018   Alcoholism /alcohol abuse 02/24/2018   Paresthesia 02/24/2018   Ectatic  aorta ([place]) 02/20/2018   Emphysema lung ([place]) 02/20/2018   Atherosclerosis of aorta ([place]) 02/20/2018   Hypertension, benign 08/31/2015   Tobacco use 08/31/2015    Past Surgical History:  Procedure Laterality Date   ABDOMINAL AORTOGRAM W/LOWER EXTREMITY N/A 09/13/2019   Procedure: ABDOMINAL AORTOGRAM W/LOWER EXTREMITY;  Surgeon: Waynetta Sandy, MD;  Location: [place] [facility];  Service: Cardiovascular;  Laterality: N/A;   AMPUTATION Right 05/09/2020   Procedure: RIGHT BELOW KNEE AMPUTATION;  Surgeon: Waynetta Sandy, MD;  Location: [place];  Service: Vascular;  Laterality: Right;  w/ a block   COLONOSCOPY WITH PROPOFOL N/A 02/15/2016   Procedure: COLONOSCOPY WITH PROPOFOL;  Surgeon: Lucilla Lame, MD;  Location: [place];  Service: Endoscopy;  Laterality: N/A;   FEMORAL-POPLITEAL BYPASS GRAFT Right 09/14/2019   Procedure: BYPASS GRAFT FEMORAL-POPLITEAL ARTERY;  Surgeon: Waynetta Sandy, MD;  Location: [place];  Service: Vascular;  Laterality: Right;   HERNIA REPAIR  1999   left inguinal   INSERTION OF ILIAC STENT Right 09/14/2019   Procedure: Insertion Of Common and External Iliac Stent;  Surgeon: Waynetta Sandy, MD;  Location: [place];  Service: Vascular;  Laterality: Right;   LOWER EXTREMITY ANGIOGRAPHY Right 05/07/2018   Procedure: LOWER EXTREMITY ANGIOGRAPHY;  Surgeon: Algernon Huxley, MD;  Location: [place] [facility];  Service: Cardiovascular;  Laterality: Right;   LOWER EXTREMITY ANGIOGRAPHY Right 06/25/2018   Procedure: LOWER EXTREMITY ANGIOGRAPHY;  Surgeon: Algernon Huxley, MD;  Location: [place] [facility];  Service: Cardiovascular;  Laterality: Right;   LOWER EXTREMITY ANGIOGRAPHY Right 07/01/2018   Procedure: LOWER EXTREMITY ANGIOGRAPHY;  Surgeon: Algernon Huxley, MD;  Location: [place] [facility];  Service: Cardiovascular;  Laterality: Right;   LOWER EXTREMITY ANGIOGRAPHY Right 08/12/2018   Procedure: LOWER EXTREMITY ANGIOGRAPHY;  Surgeon:  Algernon Huxley, MD;  Location: [place] [facility];  Service: Cardiovascular;  Laterality: Right;   LOWER EXTREMITY ANGIOGRAPHY Right 09/03/2018   Procedure: LOWER EXTREMITY ANGIOGRAPHY;  Surgeon: Algernon Huxley, MD;  Location: [place] [facility];  Service: Cardiovascular;  Laterality: Right;   LOWER EXTREMITY ANGIOGRAPHY Right 09/04/2018   Procedure: Lower Extremity Angiography;  Surgeon: Algernon Huxley, MD;  Location: [place] [facility];  Service: Cardiovascular;  Laterality: Right;   LOWER EXTREMITY ANGIOGRAPHY Right 10/01/2018   Procedure: LOWER EXTREMITY ANGIOGRAPHY;  Surgeon: Algernon Huxley, MD;  Location: [place] [facility];  Service: Cardiovascular;  Laterality: Right;   LOWER EXTREMITY ANGIOGRAPHY Right 10/01/2018   Procedure: Lower Extremity Angiography;  Surgeon: Algernon Huxley, MD;  Location: [place] [facility];  Service: Cardiovascular;  Laterality: Right;   LOWER EXTREMITY ANGIOGRAPHY Right 10/15/2018   Procedure: LOWER EXTREMITY ANGIOGRAPHY;  Surgeon: Algernon Huxley, MD;  Location: [place] [facility];  Service: Cardiovascular;  Laterality: Right;   LOWER EXTREMITY ANGIOGRAPHY Left 10/16/2018   Procedure: Lower Extremity Angiography;  Surgeon: Algernon Huxley, MD;  Location: [place] [facility];  Service: Cardiovascular;  Laterality: Left;   LOWER EXTREMITY ANGIOGRAPHY Left 01/20/2019   Procedure: LOWER EXTREMITY ANGIOGRAPHY;  Surgeon: Algernon Huxley, MD;  Location: [place] [facility];  Service: Cardiovascular;  Laterality: Left;   LOWER EXTREMITY ANGIOGRAPHY Right 01/21/2019   Procedure: Lower Extremity Angiography;  Surgeon: Algernon Huxley, MD;  Location: [place] [facility];  Service: Cardiovascular;  Laterality: Right;   LOWER EXTREMITY ANGIOGRAPHY Right 01/28/2019   Procedure: LOWER EXTREMITY ANGIOGRAPHY;  Surgeon: Algernon Huxley, MD;  Location: [place] [facility];  Service: Cardiovascular;  Laterality: Right;   LOWER EXTREMITY ANGIOGRAPHY Right 01/29/2019   Procedure: Lower Extremity  Angiography;  Surgeon: Algernon Huxley, MD;  Location: [place] [facility];  Service: Cardiovascular;  Laterality: Right;   LOWER EXTREMITY ANGIOGRAPHY Right 04/04/2020   Procedure: LOWER EXTREMITY ANGIOGRAPHY;  Surgeon: Waynetta Sandy, MD;  Location: [place] [facility];  Service: Cardiovascular;  Laterality: Right;   LOWER EXTREMITY INTERVENTION Right 07/02/2018   Procedure: LOWER EXTREMITY INTERVENTION;  Surgeon: Algernon Huxley, MD;  Location: [place] [facility];  Service: Cardiovascular;  Laterality: Right;   PERIPHERAL VASCULAR BALLOON ANGIOPLASTY Left 04/04/2020   Procedure: PERIPHERAL VASCULAR BALLOON ANGIOPLASTY;  Surgeon: Waynetta Sandy, MD;  Location: [place] [facility];  Service: Cardiovascular;  Laterality: Left;  external iliac   POLYPECTOMY N/A 02/15/2016   Procedure: POLYPECTOMY;  Surgeon: Lucilla Lame, MD;  Location: [place];  Service: Endoscopy;  Laterality: N/A;    Family History  Problem Relation Age of Onset   COPD Mother    Hypertension Father    Heart attack Brother       Current Outpatient Medications:    albuterol (VENTOLIN HFA) 108 (90 Base) MCG/ACT inhaler, Inhale 2 puffs into the lungs every 6 (six) hours as needed for wheezing or shortness of breath., Disp: 18 g, Rfl: 0   amLODipine-valsartan (EXFORGE) 5-160 MG tablet, Take 1 tablet by mouth once daily, Disp: 90 tablet, Rfl: 0   ascorbic acid (VITAMIN C) 500 MG tablet, Take  1 tablet (500 mg total) by mouth daily., Disp: 30 tablet, Rfl: 0   aspirin EC 81 MG tablet, Take 1 tablet (81 mg total) by mouth daily., Disp: 90 tablet, Rfl: 3   atorvastatin (LIPITOR) 20 MG tablet, Take 1 tablet by mouth once daily, Disp: 90 tablet, Rfl: 0   Cholecalciferol (DIALYVITE VITAMIN D 5000) 125 MCG (5000 UT) capsule, Take 5,000 Units by mouth daily., Disp: , Rfl:    clopidogrel (PLAVIX) 75 MG tablet, Take 1 tablet by mouth once daily, Disp: 90 tablet, Rfl: 0   Cyanocobalamin (B-12) 500 MCG SUBL, Place 1  tablet under the tongue daily. (Patient taking differently: Place 500 mcg under the tongue daily.), Disp: 30 tablet, Rfl: 0   DULoxetine (CYMBALTA) 60 MG capsule, Take 1 capsule by mouth once daily, Disp: 90 capsule, Rfl: 0   Fluticasone-Umeclidin-Vilant (TRELEGY ELLIPTA) 100-62.5-25 MCG/INH AEPB, Inhale 1 puff into the lungs daily as needed (shortness of breath)., Disp: 60 each, Rfl: 5   gabapentin (NEURONTIN) 100 MG capsule, TAKE 1 CAPSULE BY MOUTH THREE TIMES DAILY, Disp: 270 capsule, Rfl: 0   Multiple Vitamin (MULTIVITAMIN WITH MINERALS) TABS tablet, Take 1 tablet by mouth daily., Disp: , Rfl:    pantoprazole (PROTONIX) 20 MG tablet, Take 1 tablet by mouth once daily, Disp: 90 tablet, Rfl: 0   predniSONE (DELTASONE) 20 MG tablet, 2 tabs po daily x 4 days, Disp: 8 tablet, Rfl: 0   traZODone (DESYREL) 50 MG tablet, Take 1.5 tablets (75 mg total) by mouth at bedtime., Disp: 135 tablet, Rfl: 0  No Known Allergies  I personally reviewed active problem list, medication list, allergies, family history, social history, health maintenance with the patient/caregiver today.   ROS  Constitutional: Negative for fever or weight change.  Respiratory: Negative for cough and shortness of breath.   Cardiovascular: Negative for chest pain or palpitations.  Gastrointestinal: Negative for abdominal pain, no bowel changes.  Musculoskeletal: Negative for gait problem or joint swelling.  Skin: Negative for rash.  Neurological: Negative for dizziness or headache.  No other specific complaints in a complete review of systems (except as listed in HPI above).   Objective  Virtual encounter, vitals not obtained.  There is no height or weight on file to calculate BMI.  Physical Exam  Awake, alert and oriented   PHQ2/9: Depression screen [facility] 2/9 05/21/2021 01/17/2021 10/18/2020 07/13/2020 06/05/2020  Decreased Interest 0 0 0 0 0  Down, Depressed, Hopeless 0 0 0 0 0  PHQ - 2 Score 0 0 0 0 0  Altered  sleeping 0 - - - 1  Tired, decreased energy 0 - - - 0  Change in appetite 0 - - - 0  Feeling bad or failure about yourself  0 - - - 0  Trouble concentrating 0 - - - 0  Moving slowly or fidgety/restless 0 - - - 0  Suicidal thoughts 0 - - - 0  PHQ-9 Score 0 - - - 1  Difficult doing work/chores - - - - -  Some recent data might be hidden   PHQ-2/9 Result is negative.    Fall Risk: Fall Risk  05/21/2021 01/17/2021 10/18/2020 07/13/2020 06/05/2020  Falls in the past year? 0 0 0 0 0  Number falls in past yr: 0 0 0 0 0  Injury with Fall? 0 0 0 0 0  Risk for fall due to : No Fall Risks - - - -  Follow up Falls prevention discussed - - - -  Assessment & Plan  1. COPD with acute exacerbation ([place])  Advised to call back if worsening of symptoms or fever - benzonatate (TESSALON) 100 MG capsule; Take 1-2 capsules (100-200 mg total) by mouth 2 (two) times daily as needed.  Dispense: 40 capsule; Refill: 0 - predniSONE (DELTASONE) 20 MG tablet; Take 1 tablet (20 mg total) by mouth 2 (two) times daily with a meal.  Dispense: 10 tablet; Refill: 0  2. Phantom limb pain ([place])  He will wean self off duloxetine slowly   I discussed the assessment and treatment plan with the patient. The patient was provided an opportunity to ask questions and all were answered. The patient agreed with the plan and demonstrated an understanding of the instructions.  The patient was advised to call back or seek an in-person evaluation if the symptoms worsen or if the condition fails to improve as anticipated.  I provided 25 minutes of non-face-to-face time during this encounter.

## 2021-08-15 ENCOUNTER — Telehealth (INDEPENDENT_AMBULATORY_CARE_PROVIDER_SITE_OTHER): Payer: 59 | Admitting: Family Medicine

## 2021-08-15 ENCOUNTER — Encounter: Payer: Self-pay | Admitting: Family Medicine

## 2021-08-15 VITALS — Ht 68.0 in | Wt 158.0 lb

## 2021-08-15 DIAGNOSIS — J441 Chronic obstructive pulmonary disease with (acute) exacerbation: Secondary | ICD-10-CM

## 2021-08-15 DIAGNOSIS — G546 Phantom limb syndrome with pain: Secondary | ICD-10-CM

## 2021-08-15 MED ORDER — PREDNISONE 20 MG PO TABS: 20.0000 mg | ORAL_TABLET | Freq: Two times a day (BID) | ORAL | 0 refills | Status: DC

## 2021-08-15 MED ORDER — BENZONATATE 100 MG PO CAPS: 100.0000 mg | ORAL_CAPSULE | Freq: Two times a day (BID) | ORAL | 0 refills | Status: DC | PRN

## 2021-08-30 ENCOUNTER — Other Ambulatory Visit: Payer: Self-pay | Admitting: Family Medicine

## 2021-08-30 DIAGNOSIS — F1021 Alcohol dependence, in remission: Secondary | ICD-10-CM

## 2021-09-17 NOTE — Progress Notes (Deleted)
Name: Ryan [name]   MRN: [id number]    DOB: 05/12/1956   Date:09/17/2021 ? ?     Progress Note ? ?Subjective ? ?Chief Complaint ? ?Follow Up ? ?HPI ? ?Phantom pains: still experiencing phantom pains but states they are "normal". Describes them as increasing when he walks for a long time. States prosthesis is still performing well.  ? ?Dysthymia: States he is doing "good". Denies sadness or depressed mood today.  ? ?Insomnia: States it is "normal". Using Trazodone- sometimes takes 1.5 and sometimes doesn't need it.  ? ?HTN: Blood pressure is still well controlled today. Pt states he has gained ~4 lbs. States he is walking a lot more.  ? ?Emphysema: States he is wheezing at night when he goes to lay down. Would like to discuss a nebulizer today. States he is using Ventolin about once per day. Still smoking currently ~5 cigarettes per day. Does not want patches or anything to assist with cessation, states he can quit on his own. Would like a flu vaccine today.  ? ?Atherosclerosis of Aorta Aneurysma of ascending aorta: Has not had a CT scan this year for monitoring.  ? ?Alcoholism: Still taking Vitamin B12 and B1 for deficiency.Taking B12 every other day. Last lab value was >2000. Confirms continued sobriety.  ? ?Tinnitus: Still experiencing ringing. He is using his father's hearing aid and states it works fine. Did go ENT  for tinnitus but was told there wasn't much that could be done. Did not want to pursue further testing or fitting for individual hearing aid.  ? ?Patient Active Problem List  ? Diagnosis Date Noted  ? Phantom limb pain ([place])   ? Below-knee amputation of right lower extremity ([place]) 05/17/2020  ? Iron deficiency anemia 11/23/2018  ? Stage 3 chronic kidney disease ([place]) 10/21/2018  ? Atherosclerosis of native arteries of extremity with rest pain ([place]) 10/13/2018  ? PAD (peripheral artery disease) ([place]) 07/21/2018  ? Claudication ([place]) 03/17/2018  ? B12 deficiency 03/12/2018  ? Vitamin B1 deficiency  03/12/2018  ? Varicose veins of leg with pain, right 03/12/2018  ? Enlarged thoracic aorta ([place]) 02/24/2018  ? Alcoholism /alcohol abuse 02/24/2018  ? Paresthesia 02/24/2018  ? Ectatic aorta ([place]) 02/20/2018  ? Emphysema lung ([place]) 02/20/2018  ? Atherosclerosis of aorta ([place]) 02/20/2018  ? Hypertension, benign 08/31/2015  ? Tobacco use 08/31/2015  ? ? ?Past Surgical History:  ?Procedure Laterality Date  ? ABDOMINAL AORTOGRAM W/LOWER EXTREMITY N/A 09/13/2019  ? Procedure: ABDOMINAL AORTOGRAM W/LOWER EXTREMITY;  Surgeon: Waynetta Sandy, MD;  Location: [facility];  Service: Cardiovascular;  Laterality: N/A;  ? AMPUTATION Right 05/09/2020  ? Procedure: RIGHT BELOW KNEE AMPUTATION;  Surgeon: Waynetta Sandy, MD;  Location: [facility];  Service: Vascular;  Laterality: Right;  w/ a block  ? COLONOSCOPY WITH PROPOFOL N/A 02/15/2016  ? Procedure: COLONOSCOPY WITH PROPOFOL;  Surgeon: Lucilla Lame, MD;  Location: [facility];  Service: Endoscopy;  Laterality: N/A;  ? FEMORAL-POPLITEAL BYPASS GRAFT Right 09/14/2019  ? Procedure: BYPASS GRAFT FEMORAL-POPLITEAL ARTERY;  Surgeon: Waynetta Sandy, MD;  Location: [facility];  Service: Vascular;  Laterality: Right;  ? HERNIA REPAIR  1999  ? left inguinal  ? INSERTION OF ILIAC STENT Right 09/14/2019  ? Procedure: Insertion Of Common and External Iliac Stent;  Surgeon: Waynetta Sandy, MD;  Location: [facility];  Service: Vascular;  Laterality: Right;  ? LOWER EXTREMITY ANGIOGRAPHY Right 05/07/2018  ? Procedure: LOWER EXTREMITY ANGIOGRAPHY;  Surgeon: Leotis Pain  S, MD;  Location: [facility];  Service: Cardiovascular;  Laterality: Right;  ? LOWER EXTREMITY ANGIOGRAPHY Right 06/25/2018  ? Procedure: LOWER EXTREMITY ANGIOGRAPHY;  Surgeon: Algernon Huxley, MD;  Location: [facility];  Service: Cardiovascular;  Laterality: Right;  ? LOWER EXTREMITY ANGIOGRAPHY Right 07/01/2018  ? Procedure: LOWER EXTREMITY ANGIOGRAPHY;  Surgeon: Algernon Huxley,  MD;  Location: [facility];  Service: Cardiovascular;  Laterality: Right;  ? LOWER EXTREMITY ANGIOGRAPHY Right 08/12/2018  ? Procedure: LOWER EXTREMITY ANGIOGRAPHY;  Surgeon: Algernon Huxley, MD;  Location: [facility];  Service: Cardiovascular;  Laterality: Right;  ? LOWER EXTREMITY ANGIOGRAPHY Right 09/03/2018  ? Procedure: LOWER EXTREMITY ANGIOGRAPHY;  Surgeon: Algernon Huxley, MD;  Location: [facility];  Service: Cardiovascular;  Laterality: Right;  ? LOWER EXTREMITY ANGIOGRAPHY Right 09/04/2018  ? Procedure: Lower Extremity Angiography;  Surgeon: Algernon Huxley, MD;  Location: [facility];  Service: Cardiovascular;  Laterality: Right;  ? LOWER EXTREMITY ANGIOGRAPHY Right 10/01/2018  ? Procedure: LOWER EXTREMITY ANGIOGRAPHY;  Surgeon: Algernon Huxley, MD;  Location: [facility];  Service: Cardiovascular;  Laterality: Right;  ? LOWER EXTREMITY ANGIOGRAPHY Right 10/01/2018  ? Procedure: Lower Extremity Angiography;  Surgeon: Algernon Huxley, MD;  Location: [facility];  Service: Cardiovascular;  Laterality: Right;  ? LOWER EXTREMITY ANGIOGRAPHY Right 10/15/2018  ? Procedure: LOWER EXTREMITY ANGIOGRAPHY;  Surgeon: Algernon Huxley, MD;  Location: [facility];  Service: Cardiovascular;  Laterality: Right;  ? LOWER EXTREMITY ANGIOGRAPHY Left 10/16/2018  ? Procedure: Lower Extremity Angiography;  Surgeon: Algernon Huxley, MD;  Location: [facility];  Service: Cardiovascular;  Laterality: Left;  ? LOWER EXTREMITY ANGIOGRAPHY Left 01/20/2019  ? Procedure: LOWER EXTREMITY ANGIOGRAPHY;  Surgeon: Algernon Huxley, MD;  Location: [facility];  Service: Cardiovascular;  Laterality: Left;  ? LOWER EXTREMITY ANGIOGRAPHY Right 01/21/2019  ? Procedure: Lower Extremity Angiography;  Surgeon: Algernon Huxley, MD;  Location: [facility];  Service: Cardiovascular;  Laterality: Right;  ? LOWER EXTREMITY ANGIOGRAPHY Right 01/28/2019  ? Procedure: LOWER EXTREMITY ANGIOGRAPHY;  Surgeon:  Algernon Huxley, MD;  Location: [facility];  Service: Cardiovascular;  Laterality: Right;  ? LOWER EXTREMITY ANGIOGRAPHY Right 01/29/2019  ? Procedure: Lower Extremity Angiography;  Surgeon: Algernon Huxley, MD;  Location: [facility];  Service: Cardiovascular;  Laterality: Right;  ? LOWER EXTREMITY ANGIOGRAPHY Right 04/04/2020  ? Procedure: LOWER EXTREMITY ANGIOGRAPHY;  Surgeon: Waynetta Sandy, MD;  Location: [facility];  Service: Cardiovascular;  Laterality: Right;  ? LOWER EXTREMITY INTERVENTION Right 07/02/2018  ? Procedure: LOWER EXTREMITY INTERVENTION;  Surgeon: Algernon Huxley, MD;  Location: [facility];  Service: Cardiovascular;  Laterality: Right;  ? PERIPHERAL VASCULAR BALLOON ANGIOPLASTY Left 04/04/2020  ? Procedure: PERIPHERAL VASCULAR BALLOON ANGIOPLASTY;  Surgeon: Waynetta Sandy, MD;  Location: [facility];  Service: Cardiovascular;  Laterality: Left;  external iliac  ? POLYPECTOMY N/A 02/15/2016  ? Procedure: POLYPECTOMY;  Surgeon: Lucilla Lame, MD;  Location: [facility];  Service: Endoscopy;  Laterality: N/A;  ? ? ?Family History  ?Problem Relation Age of Onset  ? COPD Mother   ? Hypertension Father   ? Heart attack Brother   ? ? ?Social History  ? ?Tobacco Use  ? Smoking status: Former  ?  Packs/day: 0.50  ?  Years: 40.00  ?  Pack years: 20.00  ?  Types: Cigarettes  ?  Start date: 07/21/1978  ?  Quit date: 12/2018  ?  Years since quitting: 2.7  ? Smokeless tobacco: Never  ?Substance Use Topics  ? Alcohol use: Not Currently  ?  Alcohol/week: 12.0 standard drinks  ?  Types: 12 Cans of beer per week  ?  Comment: 4 beers / week now per pt 05/05/20  ? ? ? ?Current Outpatient Medications:  ?  albuterol (VENTOLIN HFA) 108 (90 Base) MCG/ACT inhaler, Inhale 2 puffs into the lungs every 6 (six) hours as needed for wheezing or shortness of breath., Disp: 18 g, Rfl: 0 ?  amLODipine-valsartan (EXFORGE) 5-160 MG tablet, Take 1 tablet by mouth once daily,  Disp: 90 tablet, Rfl: 0 ?  ascorbic acid (VITAMIN C) 500 MG tablet, Take 1 tablet (500 mg total) by mouth daily., Disp: 30 tablet, Rfl: 0 ?  aspirin EC 81 MG tablet, Take 1 tablet (81 mg total) by mout

## 2021-09-18 ENCOUNTER — Other Ambulatory Visit: Payer: Self-pay | Admitting: Family Medicine

## 2021-09-18 ENCOUNTER — Ambulatory Visit: Payer: 59 | Admitting: Family Medicine

## 2021-09-18 DIAGNOSIS — G4709 Other insomnia: Secondary | ICD-10-CM

## 2021-09-18 NOTE — Telephone Encounter (Signed)
Please review for refill. Unable to reorder, Trelegy, message received that new order is needed.  ?

## 2021-09-18 NOTE — Telephone Encounter (Signed)
Medication Refill - Medication: traZODone (DESYREL) 50 MG tablet ?Fluticasone-Umeclidin-Vilant (TRELEGY ELLIPTA) 100-62.5-25 MCG/INH  ? ?Has the patient contacted their pharmacy? No. ?Pt had appointment this morning, but his car would not start. ?Pt rescheduled for the first available he could make which was May 1. Would appreciate a 30 day supply to get him to next appointment ?Preferred Pharmacy (with phone number or street name): [facility] (N), [place] [facility] [place] ? ?Has the patient been seen for an appointment in the last year OR does the patient have an upcoming appointment? Yes.   ? ?Agent: Please be advised that RX refills may take up to 3 business days. We ask that you follow-up with your pharmacy. ? ?

## 2021-09-19 MED ORDER — TRAZODONE HCL 50 MG PO TABS: 75.0000 mg | ORAL_TABLET | Freq: Every day | ORAL | 0 refills | Status: DC

## 2021-09-19 NOTE — Telephone Encounter (Signed)
Requested Prescriptions  ?Pending Prescriptions Disp Refills  ?? traZODone (DESYREL) 50 MG tablet 135 tablet 0  ?  Sig: Take 1.5 tablets (75 mg total) by mouth at bedtime.  ?  ? Psychiatry: Antidepressants - Serotonin Modulator Passed - 09/18/2021  8:00 AM  ?  ?  Passed - Valid encounter within last 6 months  ?  Recent Outpatient Visits   ?      ? 1 month ago COPD with acute exacerbation ([place])  ? [facility] [place], Drue Stager, MD  ? 4 months ago Need for immunization against influenza  ? [facility], Drue Stager, MD  ? 8 months ago PAD (peripheral artery disease) [facility])  ? [facility] [place], Drue Stager, MD  ? 11 months ago PAD (peripheral artery disease) [facility])  ? [facility] [place], Drue Stager, MD  ? 1 year ago PAD (peripheral artery disease) [facility])  ? [facility]izer, MD  ?  ?  ?Future Appointments   ?        ? In 1 month Steele Sizer, MD [facility] [place]  ?  ? ?  ?  ?  ? ?

## 2021-10-04 ENCOUNTER — Other Ambulatory Visit: Payer: Self-pay | Admitting: Family Medicine

## 2021-10-04 DIAGNOSIS — I739 Peripheral vascular disease, unspecified: Secondary | ICD-10-CM

## 2021-10-19 ENCOUNTER — Other Ambulatory Visit: Payer: Self-pay | Admitting: Family Medicine

## 2021-10-19 DIAGNOSIS — G4709 Other insomnia: Secondary | ICD-10-CM

## 2021-10-19 NOTE — Progress Notes (Signed)
Name: Ryan [name]   MRN: [id number]    DOB: 11/08/1955   Date:10/22/2021 ? ?     Progress Note ? ?Subjective ? ?Chief Complaint ? ?Follow Up ? ?HPI ? ?Phantom pains: still experiencing phantom pains but states they are "normal". Describes them as increasing when he walks for a long time. Has permanent prosthesis and walking well  ? ?Malnutrition: he has lost almost 10 % of his body weight in the past 5 months, he is not sure why. Appetite is good, no change in bowel movements, no abdominal pain or change in moles. Denies increase in sob. Before the end of the visit he told me he has been doing cocaine a few times a week over the past two months, discussed importance of quitting and his previous history of addiction, likely the reason for weight loss  ? ?Dysthymia: States he is doing "good", taking Duloxetine and denies side effects . Denies sadness or depressed mood today.  ? ?Insomnia: States it is "normal". Using Trazodone- sometimes takes 1.5 but would like to go up on the dose, we will try 100 mg , explained we can change therapy if stops working . Advised to not nap during the day   ? ?HTN: Blood pressure is still well controlled today. Continue current regiment and recheck labs. He has CKI stage III and is on ARB ? ?Emphysema: he is taking Trelegy, states wheezing at night has decreased, still has some SOB when walking but stable. He has mild morning cough  but usually dry now. He had on flare earlier this year and had to take prednisone   ? ?Atherosclerosis of Ectasy ascending aorta: reviewed last CT and no aneurysm but  ascending aorta was 3.8 cm , we will continue to monitor  ? ?Alcoholism: Still taking Vitamin B12 and B1 for deficiency.Taking B12 every other day. Last lab value was >2000. We will recheck labs .  ? ? ?Patient Active Problem List  ? Diagnosis Date Noted  ? Phantom limb pain ([place])   ? Below-knee amputation of right lower extremity ([place]) 05/17/2020  ? Iron deficiency anemia 11/23/2018  ?  Stage 3 chronic kidney disease ([place]) 10/21/2018  ? 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 PAD (peripheral artery disease) ([place]) 07/21/2018  ? Claudication ([place]) 03/17/2018  ? B12 deficiency 03/12/2018  ? Vitamin B1 deficiency 03/12/2018  ? Varicose veins of leg with pain, right 03/12/2018  ? Enlarged thoracic aorta ([place]) 02/24/2018  ? Alcoholism /alcohol abuse 02/24/2018  ? Paresthesia 02/24/2018  ? Ectatic aorta ([place]) 02/20/2018  ? Emphysema lung ([place]) 02/20/2018  ? Atherosclerosis of aorta ([place]) 02/20/2018  ? Hypertension, benign 08/31/2015  ? Tobacco use 08/31/2015  ? ? ?Past Surgical History:  ?Procedure Laterality Date  ? ABDOMINAL AORTOGRAM W/LOWER EXTREMITY N/A 09/13/2019  ? Procedure: ABDOMINAL AORTOGRAM W/LOWER EXTREMITY;  Surgeon: Waynetta Sandy, MD;  Location: [facility];  Service: Cardiovascular;  Laterality: N/A;  ? AMPUTATION Right 05/09/2020  ? Procedure: RIGHT BELOW KNEE AMPUTATION;  Surgeon: Waynetta Sandy, MD;  Location: [facility];  Service: Vascular;  Laterality: Right;  w/ a block  ? COLONOSCOPY WITH PROPOFOL N/A 02/15/2016  ? Procedure: COLONOSCOPY WITH PROPOFOL;  Surgeon: Lucilla Lame, MD;  Location: [facility];  Service: Endoscopy;  Laterality: N/A;  ? FEMORAL-POPLITEAL BYPASS GRAFT Right 09/14/2019  ? Procedure: BYPASS GRAFT FEMORAL-POPLITEAL ARTERY;  Surgeon: Waynetta Sandy, MD;  Location: [facility];  Service: Vascular;  Laterality:  Right;  ? HERNIA REPAIR  1999  ? left inguinal  ? INSERTION OF ILIAC STENT Right 09/14/2019  ? Procedure: Insertion Of Common and External Iliac Stent;  Surgeon: Waynetta Sandy, MD;  Location: [facility];  Service: Vascular;  Laterality: Right;  ? LOWER EXTREMITY ANGIOGRAPHY Right 05/07/2018  ? Procedure: LOWER EXTREMITY ANGIOGRAPHY;  Surgeon: Algernon Huxley, MD;  Location: [facility];  Service: Cardiovascular;  Laterality: Right;  ? LOWER EXTREMITY ANGIOGRAPHY Right  06/25/2018  ? Procedure: LOWER EXTREMITY ANGIOGRAPHY;  Surgeon: Algernon Huxley, MD;  Location: [facility];  Service: Cardiovascular;  Laterality: Right;  ? LOWER EXTREMITY ANGIOGRAPHY Right 07/01/2018  ? Procedure: LOWER EXTREMITY ANGIOGRAPHY;  Surgeon: Algernon Huxley, MD;  Location: [facility];  Service: Cardiovascular;  Laterality: Right;  ? LOWER EXTREMITY ANGIOGRAPHY Right 08/12/2018  ? Procedure: LOWER EXTREMITY ANGIOGRAPHY;  Surgeon: Algernon Huxley, MD;  Location: [facility];  Service: Cardiovascular;  Laterality: Right;  ? LOWER EXTREMITY ANGIOGRAPHY Right 09/03/2018  ? Procedure: LOWER EXTREMITY ANGIOGRAPHY;  Surgeon: Algernon Huxley, MD;  Location: [facility];  Service: Cardiovascular;  Laterality: Right;  ? LOWER EXTREMITY ANGIOGRAPHY Right 09/04/2018  ? Procedure: Lower Extremity Angiography;  Surgeon: Algernon Huxley, MD;  Location: [facility];  Service: Cardiovascular;  Laterality: Right;  ? LOWER EXTREMITY ANGIOGRAPHY Right 10/01/2018  ? Procedure: LOWER EXTREMITY ANGIOGRAPHY;  Surgeon: Algernon Huxley, MD;  Location: [facility];  Service: Cardiovascular;  Laterality: Right;  ? LOWER EXTREMITY ANGIOGRAPHY Right 10/01/2018  ? Procedure: Lower Extremity Angiography;  Surgeon: Algernon Huxley, MD;  Location: [facility];  Service: Cardiovascular;  Laterality: Right;  ? LOWER EXTREMITY ANGIOGRAPHY Right 10/15/2018  ? Procedure: LOWER EXTREMITY ANGIOGRAPHY;  Surgeon: Algernon Huxley, MD;  Location: [facility];  Service: Cardiovascular;  Laterality: Right;  ? LOWER EXTREMITY ANGIOGRAPHY Left 10/16/2018  ? Procedure: Lower Extremity Angiography;  Surgeon: Algernon Huxley, MD;  Location: [facility];  Service: Cardiovascular;  Laterality: Left;  ? LOWER EXTREMITY ANGIOGRAPHY Left 01/20/2019  ? Procedure: LOWER EXTREMITY ANGIOGRAPHY;  Surgeon: Algernon Huxley, MD;  Location: [place] [facility];  Service: Cardiovascular;  Laterality: Left;  ? LOWER EXTREMITY  ANGIOGRAPHY Right 01/21/2019  ? Procedure: Lower Extremity Angiography;  Surgeon: Algernon Huxley, MD;  Location: [facility];  Service: Cardiovascular;  Laterality: Right;  ? LOWER EXTREMITY ANGIOGRAPHY Right 01/28/2019  ? Procedure: LOWER EXTREMITY ANGIOGRAPHY;  Surgeon: Algernon Huxley, MD;  Location: [facility];  Service: Cardiovascular;  Laterality: Right;  ? LOWER EXTREMITY ANGIOGRAPHY Right 01/29/2019  ? Procedure: Lower Extremity Angiography;  Surgeon: Algernon Huxley, MD;  Location: [place];  Service: Cardiovascular;  Laterality: Right;  ? LOWER EXTREMITY ANGIOGRAPHY Right 04/04/2020  ? Procedure: LOWER EXTREMITY ANGIOGRAPHY;  Surgeon: Waynetta Sandy, MD;  Location: [place] [facility];  Service: Cardiovascular;  Laterality: Right;  ? LOWER EXTREMITY INTERVENTION Right 07/02/2018  ? Procedure: LOWER EXTREMITY INTERVENTION;  Surgeon: Algernon Huxley, MD;  Location: [place] [facility];  Service: Cardiovascular;  Laterality: Right;  ? PERIPHERAL VASCULAR BALLOON ANGIOPLASTY Left 04/04/2020  ? Procedure: PERIPHERAL VASCULAR BALLOON ANGIOPLASTY;  Surgeon: Waynetta Sandy, MD;  Location: [facility] [facility];  Service: Cardiovascular;  Laterality: Left;  external iliac  ? POLYPECTOMY N/A 02/15/2016  ? Procedure: POLYPECTOMY;  Surgeon: Lucilla Lame, MD;  Location: [place];  Service:  Endoscopy;  Laterality: N/A;  ? ? ?Family History  ?Problem Relation Age of Onset  ? COPD Mother   ? Hypertension Father   ? Heart attack Brother   ? ? ?Social History  ? ?Tobacco Use  ? Smoking status: Former  ?  Packs/day: 0.50  ?  Years: 40.00  ?  Pack years: 20.00  ?  Types: Cigarettes  ?  Start date: 07/21/1978  ?  Quit date: 12/2018  ?  Years since quitting: 2.8  ? Smokeless tobacco: Never  ?Substance Use Topics  ? Alcohol use: Not Currently  ?  Alcohol/week: 12.0 standard drinks  ?  Types: 12 Cans of beer per week  ?  Comment: 4 beers / week now per pt 05/05/20  ? ? ? ?Current Outpatient  Medications:  ?  albuterol (VENTOLIN HFA) 108 (90 Base) MCG/ACT inhaler, Inhale 2 puffs into the lungs every 6 (six) hours as needed for wheezing or shortness of breath., Disp: 18 g, Rfl: 0 ?  amLODipine-valsa

## 2021-10-22 ENCOUNTER — Ambulatory Visit (INDEPENDENT_AMBULATORY_CARE_PROVIDER_SITE_OTHER): Payer: 59 | Admitting: Family Medicine

## 2021-10-22 ENCOUNTER — Encounter: Payer: Self-pay | Admitting: Family Medicine

## 2021-10-22 VITALS — BP 128/86 | HR 90 | Resp 16 | Ht 68.0 in | Wt 144.0 lb

## 2021-10-22 DIAGNOSIS — I700 Atherosclerosis of aorta: Secondary | ICD-10-CM | POA: Diagnosis not present

## 2021-10-22 DIAGNOSIS — G546 Phantom limb syndrome with pain: Secondary | ICD-10-CM

## 2021-10-22 DIAGNOSIS — N183 Chronic kidney disease, stage 3 unspecified: Secondary | ICD-10-CM

## 2021-10-22 DIAGNOSIS — E519 Thiamine deficiency, unspecified: Secondary | ICD-10-CM

## 2021-10-22 DIAGNOSIS — F1021 Alcohol dependence, in remission: Secondary | ICD-10-CM

## 2021-10-22 DIAGNOSIS — J439 Emphysema, unspecified: Secondary | ICD-10-CM

## 2021-10-22 DIAGNOSIS — I10 Essential (primary) hypertension: Secondary | ICD-10-CM

## 2021-10-22 DIAGNOSIS — R634 Abnormal weight loss: Secondary | ICD-10-CM

## 2021-10-22 DIAGNOSIS — G4709 Other insomnia: Secondary | ICD-10-CM

## 2021-10-22 DIAGNOSIS — E538 Deficiency of other specified B group vitamins: Secondary | ICD-10-CM

## 2021-10-22 DIAGNOSIS — Z89511 Acquired absence of right leg below knee: Secondary | ICD-10-CM

## 2021-10-22 DIAGNOSIS — I7789 Other specified disorders of arteries and arterioles: Secondary | ICD-10-CM

## 2021-10-22 DIAGNOSIS — N1831 Chronic kidney disease, stage 3a: Secondary | ICD-10-CM

## 2021-10-22 DIAGNOSIS — I129 Hypertensive chronic kidney disease with stage 1 through stage 4 chronic kidney disease, or unspecified chronic kidney disease: Secondary | ICD-10-CM

## 2021-10-22 DIAGNOSIS — I739 Peripheral vascular disease, unspecified: Secondary | ICD-10-CM

## 2021-10-22 DIAGNOSIS — Z8639 Personal history of other endocrine, nutritional and metabolic disease: Secondary | ICD-10-CM

## 2021-10-22 DIAGNOSIS — I7121 Aneurysm of the ascending aorta, without rupture: Secondary | ICD-10-CM

## 2021-10-22 DIAGNOSIS — E441 Mild protein-calorie malnutrition: Secondary | ICD-10-CM

## 2021-10-22 MED ORDER — ATORVASTATIN CALCIUM 40 MG PO TABS: 40.0000 mg | ORAL_TABLET | Freq: Every day | ORAL | 1 refills | Status: DC

## 2021-10-22 MED ORDER — GABAPENTIN 100 MG PO CAPS: 100.0000 mg | ORAL_CAPSULE | Freq: Three times a day (TID) | ORAL | 1 refills | Status: DC

## 2021-10-22 MED ORDER — PANTOPRAZOLE SODIUM 20 MG PO TBEC: 20.0000 mg | DELAYED_RELEASE_TABLET | Freq: Every day | ORAL | 1 refills | Status: DC

## 2021-10-22 MED ORDER — DULOXETINE HCL 60 MG PO CPEP: 60.0000 mg | ORAL_CAPSULE | Freq: Every day | ORAL | 1 refills | Status: DC

## 2021-10-22 MED ORDER — AMLODIPINE BESYLATE-VALSARTAN 5-160 MG PO TABS: 1.0000 | ORAL_TABLET | Freq: Every day | ORAL | 1 refills | Status: DC

## 2021-10-22 MED ORDER — TRELEGY ELLIPTA 100-62.5-25 MCG/ACT IN AEPB: 1.0000 | INHALATION_SPRAY | Freq: Every day | RESPIRATORY_TRACT | 1 refills | Status: DC

## 2021-10-22 MED ORDER — CLOPIDOGREL BISULFATE 75 MG PO TABS: 75.0000 mg | ORAL_TABLET | Freq: Every day | ORAL | 1 refills | Status: DC

## 2021-10-22 MED ORDER — TRAZODONE HCL 100 MG PO TABS: 100.0000 mg | ORAL_TABLET | Freq: Every day | ORAL | 1 refills | Status: DC

## 2021-10-27 ENCOUNTER — Other Ambulatory Visit: Payer: Self-pay | Admitting: Family Medicine

## 2021-10-27 DIAGNOSIS — N179 Acute kidney failure, unspecified: Secondary | ICD-10-CM

## 2021-10-27 LAB — COMPLETE METABOLIC PANEL WITH GFR
AG Ratio: 1.8 (calc) (ref 1.0–2.5)
ALT: 11 U/L (ref 9–46)
AST: 14 U/L (ref 10–35)
Albumin: 4.2 g/dL (ref 3.6–5.1)
Alkaline phosphatase (APISO): 65 U/L (ref 35–144)
BUN/Creatinine Ratio: 14 (calc) (ref 6–22)
BUN: 31 mg/dL — ABNORMAL HIGH (ref 7–25)
CO2: 29 mmol/L (ref 20–32)
Calcium: 9.4 mg/dL (ref 8.6–10.3)
Chloride: 107 mmol/L (ref 98–110)
Creat: 2.28 mg/dL — ABNORMAL HIGH (ref 0.70–1.35)
Globulin: 2.4 g/dL (calc) (ref 1.9–3.7)
Glucose, Bld: 76 mg/dL (ref 65–99)
Potassium: 5.1 mmol/L (ref 3.5–5.3)
Sodium: 141 mmol/L (ref 135–146)
Total Bilirubin: 0.3 mg/dL (ref 0.2–1.2)
Total Protein: 6.6 g/dL (ref 6.1–8.1)
eGFR: 31 mL/min/{1.73_m2} — ABNORMAL LOW (ref >=60)

## 2021-10-27 LAB — CBC WITH DIFFERENTIAL/PLATELET
Absolute Monocytes: 444 cells/uL (ref 200–950)
Basophils Absolute: 30 cells/uL (ref 0–200)
Basophils Relative: 0.5 %
Eosinophils Absolute: 78 cells/uL (ref 15–500)
Eosinophils Relative: 1.3 %
HCT: 38.1 % — ABNORMAL LOW (ref 38.5–50.0)
Hemoglobin: 12.6 g/dL — ABNORMAL LOW (ref 13.2–17.1)
Lymphs Abs: 1206 cells/uL (ref 850–3900)
MCH: 31 pg (ref 27.0–33.0)
MCHC: 33.1 g/dL (ref 32.0–36.0)
MCV: 93.8 fL (ref 80.0–100.0)
MPV: 10.7 fL (ref 7.5–12.5)
Monocytes Relative: 7.4 %
Neutro Abs: 4242 cells/uL (ref 1500–7800)
Neutrophils Relative %: 70.7 %
Platelets: 247 10*3/uL (ref 140–400)
RBC: 4.06 10*6/uL — ABNORMAL LOW (ref 4.20–5.80)
RDW: 11.9 % (ref 11.0–15.0)
Total Lymphocyte: 20.1 %
WBC: 6 10*3/uL (ref 3.8–10.8)

## 2021-10-27 LAB — HIV ANTIBODY (ROUTINE TESTING W REFLEX): HIV 1&2 Ab, 4th Generation: NONREACTIVE

## 2021-10-27 LAB — IRON,TIBC AND FERRITIN PANEL
%SAT: 29 % (calc) (ref 20–48)
Ferritin: 34 ng/mL (ref 24–380)
Iron: 74 ug/dL (ref 50–180)
TIBC: 252 mcg/dL (calc) (ref 250–425)

## 2021-10-27 LAB — B12 AND FOLATE PANEL
Folate: 20.5 ng/mL
Vitamin B-12: 949 pg/mL (ref 200–1100)

## 2021-10-27 LAB — LIPID PANEL
Cholesterol: 141 mg/dL (ref <200)
HDL: 46 mg/dL (ref >=40)
LDL Cholesterol (Calc): 81 mg/dL (calc)
Non-HDL Cholesterol (Calc): 95 mg/dL (calc) (ref <130)
Total CHOL/HDL Ratio: 3.1 (calc) (ref <5.0)
Triglycerides: 61 mg/dL (ref <150)

## 2021-10-27 LAB — VITAMIN B1: Vitamin B1 (Thiamine): 14 nmol/L (ref 8–30)

## 2021-10-27 LAB — MICROALBUMIN / CREATININE URINE RATIO
Creatinine, Urine: 134 mg/dL (ref 20–320)
Microalb Creat Ratio: 16 mcg/mg creat (ref <30)
Microalb, Ur: 2.1 mg/dL

## 2021-10-27 LAB — TSH: TSH: 1.3 mIU/L (ref 0.40–4.50)

## 2021-10-30 ENCOUNTER — Telehealth: Payer: Self-pay

## 2021-10-30 NOTE — Telephone Encounter (Signed)
Called pt, no answer. Left detail Vm advising pt to return to office for lab work. If pt calls back, please advise. ?

## 2021-10-30 NOTE — Telephone Encounter (Signed)
Copied from [place] [id number]. Topic: General - Inquiry ?>> Oct 29, 2021  8:35 AM Royal Hawthorn, CMA wrote: ?Reason for CRM: If patient returns call, please ask him to stop by this week for bloodwork per Dr. Ancil Boozer. 8am-12pm and between 2pm-4:30pm. Please let him know his voicemail is full also please. ?

## 2021-11-03 LAB — BASIC METABOLIC PANEL WITH GFR
BUN/Creatinine Ratio: 11 (calc) (ref 6–22)
BUN: 21 mg/dL (ref 7–25)
CO2: 24 mmol/L (ref 20–32)
Calcium: 9.3 mg/dL (ref 8.6–10.3)
Chloride: 109 mmol/L (ref 98–110)
Creat: 1.94 mg/dL — ABNORMAL HIGH (ref 0.70–1.35)
Glucose, Bld: 105 mg/dL — ABNORMAL HIGH (ref 65–99)
Potassium: 4.2 mmol/L (ref 3.5–5.3)
Sodium: 141 mmol/L (ref 135–146)
eGFR: 38 mL/min/{1.73_m2} — ABNORMAL LOW (ref >=60)

## 2021-11-15 ENCOUNTER — Telehealth: Payer: Self-pay

## 2021-11-15 NOTE — Telephone Encounter (Signed)
Pt called states personal matter and only wanted to see DR. Ancil Boozer

## 2021-11-16 ENCOUNTER — Telehealth: Payer: 59 | Admitting: Internal Medicine

## 2022-01-21 NOTE — Progress Notes (Deleted)
Name: Ryan [name]   MRN: [id number]    DOB: 06-22-1956   Date:01/21/2022       Progress Note  Subjective  Chief Complaint  Follow Up  HPI  Phantom pains: still experiencing phantom pains but states they are "normal". Describes them as increasing when he walks for a long time. Has permanent prosthesis and walking well   Malnutrition: he has lost almost 10 % of his body weight in the past 5 months, he is not sure why. Appetite is good, no change in bowel movements, no abdominal pain or change in moles. Denies increase in sob. Before the end of the visit he told me he has been doing cocaine a few times a week over the past two months, discussed importance of quitting and his previous history of addiction, likely the reason for weight loss   Dysthymia: States he is doing "good", taking Duloxetine and denies side effects . Denies sadness or depressed mood today.   Insomnia: States it is "normal". Using Trazodone- sometimes takes 1.5 but would like to go up on the dose, we will try 100 mg , explained we can change therapy if stops working . Advised to not nap during the day    HTN: Blood pressure is still well controlled today. Continue current regiment and recheck labs. He has CKI stage III and is on ARB  Emphysema: he is taking Trelegy, states wheezing at night has decreased, still has some SOB when walking but stable. He has mild morning cough  but usually dry now. He had on flare earlier this year and had to take prednisone    Atherosclerosis of Ectasy ascending aorta: reviewed last CT and no aneurysm but  ascending aorta was 3.8 cm , we will continue to monitor   Alcoholism: Still taking Vitamin B12 and B1 for deficiency.Taking B12 every other day. Last lab value was >2000. We will recheck labs .   Patient Active Problem List   Diagnosis Date Noted   Phantom limb pain ([place])    Below-knee amputation of right lower extremity ([place]) 05/17/2020   Iron deficiency anemia 11/23/2018    Stage 3 chronic kidney disease ([name]) 10/21/2018   Atherosclerosis of native arteries of extremity with rest pain ([place]) 10/13/2018   PAD (peripheral artery disease) ([place]) 07/21/2018   Claudication ([place]) 03/17/2018   B12 deficiency 03/12/2018   Vitamin B1 deficiency 03/12/2018   Varicose veins of leg with pain, right 03/12/2018   Enlarged thoracic aorta ([place]) 02/24/2018   Alcoholism /alcohol abuse 02/24/2018   Paresthesia 02/24/2018   Ectatic aorta ([address]) 02/20/2018   Emphysema lung ([place]) 02/20/2018   Atherosclerosis of aorta ([place]) 02/20/2018   Hypertension, benign 08/31/2015   Tobacco use 08/31/2015    Past Surgical History:  Procedure Laterality Date   ABDOMINAL AORTOGRAM W/LOWER EXTREMITY N/A 09/13/2019   Procedure: ABDOMINAL AORTOGRAM W/LOWER EXTREMITY;  Surgeon: Waynetta Sandy, MD;  Location: [facility];  Service: Cardiovascular;  Laterality: N/A;   AMPUTATION Right 05/09/2020   Procedure: RIGHT BELOW KNEE AMPUTATION;  Surgeon: Waynetta Sandy, MD;  Location: [facility];  Service: Vascular;  Laterality: Right;  w/ a block   COLONOSCOPY WITH PROPOFOL N/A 02/15/2016   Procedure: COLONOSCOPY WITH PROPOFOL;  Surgeon: Lucilla Lame, MD;  Location: [facility];  Service: Endoscopy;  Laterality: N/A;   FEMORAL-POPLITEAL BYPASS GRAFT Right 09/14/2019   Procedure: BYPASS GRAFT FEMORAL-POPLITEAL ARTERY;  Surgeon: Waynetta Sandy, MD;  Location: [facility];  Service: Vascular;  Laterality: Right;  HERNIA REPAIR  1999   left inguinal   INSERTION OF ILIAC STENT Right 09/14/2019   Procedure: Insertion Of Common and External Iliac Stent;  Surgeon: Waynetta Sandy, MD;  Location: [facility];  Service: Vascular;  Laterality: Right;   LOWER EXTREMITY ANGIOGRAPHY Right 05/07/2018   Procedure: LOWER EXTREMITY ANGIOGRAPHY;  Surgeon: Algernon Huxley, MD;  Location: [facility];  Service: Cardiovascular;  Laterality: Right;   LOWER EXTREMITY ANGIOGRAPHY Right  06/25/2018   Procedure: LOWER EXTREMITY ANGIOGRAPHY;  Surgeon: Algernon Huxley, MD;  Location: [facility];  Service: Cardiovascular;  Laterality: Right;   LOWER EXTREMITY ANGIOGRAPHY Right 07/01/2018   Procedure: LOWER EXTREMITY ANGIOGRAPHY;  Surgeon: Algernon Huxley, MD;  Location: [facility];  Service: Cardiovascular;  Laterality: Right;   LOWER EXTREMITY ANGIOGRAPHY Right 08/12/2018   Procedure: LOWER EXTREMITY ANGIOGRAPHY;  Surgeon: Algernon Huxley, MD;  Location: [facility];  Service: Cardiovascular;  Laterality: Right;   LOWER EXTREMITY ANGIOGRAPHY Right 09/03/2018   Procedure: LOWER EXTREMITY ANGIOGRAPHY;  Surgeon: Algernon Huxley, MD;  Location: [facility];  Service: Cardiovascular;  Laterality: Right;   LOWER EXTREMITY ANGIOGRAPHY Right 09/04/2018   Procedure: Lower Extremity Angiography;  Surgeon: Algernon Huxley, MD;  Location: [facility];  Service: Cardiovascular;  Laterality: Right;   LOWER EXTREMITY ANGIOGRAPHY Right 10/01/2018   Procedure: LOWER EXTREMITY ANGIOGRAPHY;  Surgeon: Algernon Huxley, MD;  Location: [facility];  Service: Cardiovascular;  Laterality: Right;   LOWER EXTREMITY ANGIOGRAPHY Right 10/01/2018   Procedure: Lower Extremity Angiography;  Surgeon: Algernon Huxley, MD;  Location: [facility];  Service: Cardiovascular;  Laterality: Right;   LOWER EXTREMITY ANGIOGRAPHY Right 10/15/2018   Procedure: LOWER EXTREMITY ANGIOGRAPHY;  Surgeon: Algernon Huxley, MD;  Location: [facility];  Service: Cardiovascular;  Laterality: Right;   LOWER EXTREMITY ANGIOGRAPHY Left 10/16/2018   Procedure: Lower Extremity Angiography;  Surgeon: Algernon Huxley, MD;  Location: [facility];  Service: Cardiovascular;  Laterality: Left;   LOWER EXTREMITY ANGIOGRAPHY Left 01/20/2019   Procedure: LOWER EXTREMITY ANGIOGRAPHY;  Surgeon: Algernon Huxley, MD;  Location: [facility];  Service: Cardiovascular;  Laterality: Left;   LOWER EXTREMITY  ANGIOGRAPHY Right 01/21/2019   Procedure: Lower Extremity Angiography;  Surgeon: Algernon Huxley, MD;  Location: [facility];  Service: Cardiovascular;  Laterality: Right;   LOWER EXTREMITY ANGIOGRAPHY Right 01/28/2019   Procedure: LOWER EXTREMITY ANGIOGRAPHY;  Surgeon: Algernon Huxley, MD;  Location: [facility];  Service: Cardiovascular;  Laterality: Right;   LOWER EXTREMITY ANGIOGRAPHY Right 01/29/2019   Procedure: Lower Extremity Angiography;  Surgeon: Algernon Huxley, MD;  Location: [facility];  Service: Cardiovascular;  Laterality: Right;   LOWER EXTREMITY ANGIOGRAPHY Right 04/04/2020   Procedure: LOWER EXTREMITY ANGIOGRAPHY;  Surgeon: Waynetta Sandy, MD;  Location: [facility];  Service: Cardiovascular;  Laterality: Right;   LOWER EXTREMITY INTERVENTION Right 07/02/2018   Procedure: LOWER EXTREMITY INTERVENTION;  Surgeon: Algernon Huxley, MD;  Location: [facility];  Service: Cardiovascular;  Laterality: Right;   PERIPHERAL VASCULAR BALLOON ANGIOPLASTY Left 04/04/2020   Procedure: PERIPHERAL VASCULAR BALLOON ANGIOPLASTY;  Surgeon: Waynetta Sandy, MD;  Location: [facility];  Service: Cardiovascular;  Laterality: Left;  external iliac   POLYPECTOMY N/A 02/15/2016   Procedure: POLYPECTOMY;  Surgeon: Lucilla Lame, MD;  Location: [facility];  Service: Endoscopy;  Laterality:  N/A;    Family History  Problem Relation Age of Onset   COPD Mother    Hypertension Father    Heart attack Brother     Social History   Tobacco Use   Smoking status: Former    Packs/day: 0.50    Years: 40.00    Total pack years: 20.00    Types: Cigarettes    Start date: 07/21/1978    Quit date: 12/2018    Years since quitting: 3.0   Smokeless tobacco: Never   Tobacco comments:    Started using cocaine recently but states he will try to quit   Substance Use Topics   Alcohol use: Not Currently    Alcohol/week: 12.0 standard drinks of alcohol     Types: 12 Cans of beer per week    Comment: 4 beers / week now per pt 05/05/20     Current Outpatient Medications:    albuterol (VENTOLIN HFA) 108 (90 Base) MCG/ACT inhaler, Inhale 2 puffs into the lungs every 6 (six) hours as needed for wheezing or shortness of breath., Disp: 18 g, Rfl: 0   amLODipine-valsartan (EXFORGE) 5-160 MG tablet, Take 1 tablet by mouth daily., Disp: 90 tablet, Rfl: 1   ascorbic acid (VITAMIN C) 500 MG tablet, Take 1 tablet (500 mg total) by mouth daily., Disp: 30 tablet, Rfl: 0   aspirin EC 81 MG tablet, Take 1 tablet (81 mg total) by mouth daily., Disp: 90 tablet, Rfl: 3   atorvastatin (LIPITOR) 40 MG tablet, Take 1 tablet (40 mg total) by mouth daily., Disp: 90 tablet, Rfl: 1   Cholecalciferol (DIALYVITE VITAMIN D 5000) 125 MCG (5000 UT) capsule, Take 5,000 Units by mouth daily., Disp: , Rfl:    clopidogrel (PLAVIX) 75 MG tablet, Take 1 tablet (75 mg total) by mouth daily., Disp: 90 tablet, Rfl: 1   Cyanocobalamin (B-12) 500 MCG SUBL, Place 1 tablet under the tongue daily. (Patient taking differently: Place 500 mcg under the tongue daily.), Disp: 30 tablet, Rfl: 0   DULoxetine (CYMBALTA) 60 MG capsule, Take 1 capsule (60 mg total) by mouth daily., Disp: 90 capsule, Rfl: 1   Fluticasone-Umeclidin-Vilant (TRELEGY ELLIPTA) 100-62.5-25 MCG/ACT AEPB, Inhale 1 puff into the lungs daily., Disp: 3 each, Rfl: 1   gabapentin (NEURONTIN) 100 MG capsule, Take 1 capsule (100 mg total) by mouth 3 (three) times daily., Disp: 270 capsule, Rfl: 1   Multiple Vitamin (MULTIVITAMIN WITH MINERALS) TABS tablet, Take 1 tablet by mouth daily., Disp: , Rfl:    pantoprazole (PROTONIX) 20 MG tablet, Take 1 tablet (20 mg total) by mouth daily., Disp: 90 tablet, Rfl: 1   traZODone (DESYREL) 100 MG tablet, Take 1 tablet (100 mg total) by mouth at bedtime., Disp: 90 tablet, Rfl: 1  No Known Allergies  I personally reviewed active problem list, medication list, allergies, family history, social  history, health maintenance with the patient/caregiver today.   ROS  ***  Objective  There were no vitals filed for this visit.  There is no height or weight on file to calculate BMI.  Physical Exam ***  Recent Results (from the past 2160 hour(s))  BASIC METABOLIC PANEL WITH GFR     Status: Abnormal   Collection Time: 11/02/21  4:21 PM  Result Value Ref Range   Glucose, Bld 105 (H) 65 - 99 mg/dL    Comment: .            Fasting reference interval . For someone without known diabetes, a glucose value  between 100 and 125 mg/dL is consistent with prediabetes and should be confirmed with a follow-up test. .    BUN 21 7 - 25 mg/dL   Creat 1.94 (H) 0.70 - 1.35 mg/dL   eGFR 38 (L) > OR = 60 mL/min/1.47m    Comment: The eGFR is based on the CKD-EPI 2021 equation. To calculate  the new eGFR from a previous Creatinine or Cystatin C result, go to https://www.kidney.org/professionals/ kdoqi/gfr%5Fcalculator    BUN/Creatinine Ratio 11 6 - 22 (calc)   Sodium 141 135 - 146 mmol/L   Potassium 4.2 3.5 - 5.3 mmol/L   Chloride 109 98 - 110 mmol/L   CO2 24 20 - 32 mmol/L   Calcium 9.3 8.6 - 10.3 mg/dL    PHQ2/9:    10/22/2021   10:28 AM 08/15/2021    9:58 AM 05/21/2021   10:03 AM 01/17/2021    9:51 AM 10/18/2020    9:19 AM  Depression screen PHQ 2/9  Decreased Interest 0 0 0 0 0  Down, Depressed, Hopeless 0 0 0 0 0  PHQ - 2 Score 0 0 0 0 0  Altered sleeping 0 0 0    Tired, decreased energy 0 0 0    Change in appetite 0 0 0    Feeling bad or failure about yourself  0 0 0    Trouble concentrating 0 0 0    Moving slowly or fidgety/restless 0 0 0    Suicidal thoughts 0 0 0    PHQ-9 Score 0 0 0      phq 9 is {gen pos [id number]}  Fall Risk:    10/22/2021   10:28 AM 08/15/2021    9:58 AM 05/21/2021   10:03 AM 01/17/2021    9:51 AM 10/18/2020    9:19 AM  Fall Risk   Falls in the past year? 0 0 0 0 0  Number falls in past yr: 0  0 0 0  Injury with Fall? 0  0 0 0  Risk  for fall due to : No Fall Risks  No Fall Risks    Follow up Falls prevention discussed Falls prevention discussed Falls prevention discussed        Functional Status Survey:      Assessment & Plan  *** There are no diagnoses linked to this encounter.

## 2022-01-22 ENCOUNTER — Ambulatory Visit: Payer: 59 | Admitting: Family Medicine

## 2022-01-23 NOTE — Progress Notes (Unsigned)
Name: Ryan [name]   MRN: [id number]    DOB: Sep 29, 1955   Date:01/24/2022       Progress Note  Subjective  Chief Complaint  Follow Up  I connected with  Ryan [name]  on 01/24/22 at 10:20 AM EDT by telephone application and verified that I am speaking with the correct person using two identifiers.  I discussed the limitations of evaluation and management by telemedicine and the availability of in person appointments. The patient expressed understanding and agreed to proceed with the virtual visit  Staff also discussed with the patient that there may be a patient responsible charge related to this service. Patient Location: in his car - it broke on the way to his visit  Provider Location: [facility] Additional Individuals present: alone   HPI  Phantom pains: still experiencing phantom pains but states they are "normal". It is aggravated by walking ( uses prosthesis) for a prolonged period of time    Malnutrition: he had lost almost 10 % of his body weight in the past 5 months, at the time he told me that the only change was that he was  doing cocaine a few times a week for the previous two months, he states he quit using cocaine over one month ago and weight is now stable, discussed high calorie diet   Dysthymia: States he is doing "good", taking Duloxetine and denies side effects . Denies sadness or depressed mood today. Phq 9 is negative   Insomnia: taking Trazodone 100 mg every night and is able to fall asleep around midnight and wakes up around 6 am, he also naps when watching TV. No side effects  HTN: He is on ARB, his last GFR was up from 31 to 38 . He denies chest pain or palpitation   Emphysema: he is taking Trelegy, states wheezing at night has decreased, still has some SOB when walking but stable. He has mild morning cough that is usually dry and not as severe  He had on flare earlier this year and had to take prednisone    Atherosclerosis of Ectasy ascending aorta: reviewed  last CT and no aneurysm but  ascending aorta was 3.8 cm . He sees Dr. [name] for his PVD, but patient is not sure if Dr. [name] knows about the aneurysm.   Alcoholism: Still taking Vitamin B12 and B1 for deficiency.Taking B12 every other day. Last lab value was >2000. He is still off alcohol   ED: he took viagra in the past but would like to try cialis , he has difficulty starting and maintaining and erection   Patient Active Problem List   Diagnosis Date Noted   Phantom limb pain ([place])    Below-knee amputation of right lower extremity ([place]) 05/17/2020   Iron deficiency anemia 11/23/2018   Stage 3 chronic kidney disease ([place]) 10/21/2018   Atherosclerosis of native arteries of extremity with rest pain ([place]) 10/13/2018   PAD (peripheral artery disease) ([place]) 07/21/2018   Claudication ([place]) 03/17/2018   B12 deficiency 03/12/2018   Vitamin B1 deficiency 03/12/2018   Varicose veins of leg with pain, right 03/12/2018   Enlarged thoracic aorta ([place]) 02/24/2018   Alcoholism /alcohol abuse 02/24/2018   Paresthesia 02/24/2018   Ectatic aorta ([place]) 02/20/2018   Emphysema lung ([place]) 02/20/2018   Atherosclerosis of aorta ([place]) 02/20/2018   Hypertension, benign 08/31/2015   Tobacco use 08/31/2015    Past Surgical History:  Procedure Laterality Date   ABDOMINAL AORTOGRAM W/LOWER EXTREMITY N/A 09/13/2019  Procedure: ABDOMINAL AORTOGRAM W/LOWER EXTREMITY;  Surgeon: Waynetta Sandy, MD;  Location: [place] [facility];  Service: Cardiovascular;  Laterality: N/A;   AMPUTATION Right 05/09/2020   Procedure: RIGHT BELOW KNEE AMPUTATION;  Surgeon: Waynetta Sandy, MD;  Location: [place];  Service: Vascular;  Laterality: Right;  w/ a block   COLONOSCOPY WITH PROPOFOL N/A 02/15/2016   Procedure: COLONOSCOPY WITH PROPOFOL;  Surgeon: Lucilla Lame, MD;  Location: [place];  Service: Endoscopy;  Laterality: N/A;   FEMORAL-POPLITEAL BYPASS GRAFT Right 09/14/2019   Procedure: BYPASS GRAFT  FEMORAL-POPLITEAL ARTERY;  Surgeon: Waynetta Sandy, MD;  Location: [place];  Service: Vascular;  Laterality: Right;   HERNIA REPAIR  1999   left inguinal   INSERTION OF ILIAC STENT Right 09/14/2019   Procedure: Insertion Of Common and External Iliac Stent;  Surgeon: Waynetta Sandy, MD;  Location: [place];  Service: Vascular;  Laterality: Right;   LOWER EXTREMITY ANGIOGRAPHY Right 05/07/2018   Procedure: LOWER EXTREMITY ANGIOGRAPHY;  Surgeon: Algernon Huxley, MD;  Location: [place] [facility];  Service: Cardiovascular;  Laterality: Right;   LOWER EXTREMITY ANGIOGRAPHY Right 06/25/2018   Procedure: LOWER EXTREMITY ANGIOGRAPHY;  Surgeon: Algernon Huxley, MD;  Location: [place] [facility];  Service: Cardiovascular;  Laterality: Right;   LOWER EXTREMITY ANGIOGRAPHY Right 07/01/2018   Procedure: LOWER EXTREMITY ANGIOGRAPHY;  Surgeon: Algernon Huxley, MD;  Location: [place] [facility];  Service: Cardiovascular;  Laterality: Right;   LOWER EXTREMITY ANGIOGRAPHY Right 08/12/2018   Procedure: LOWER EXTREMITY ANGIOGRAPHY;  Surgeon: Algernon Huxley, MD;  Location: [place] [facility];  Service: Cardiovascular;  Laterality: Right;   LOWER EXTREMITY ANGIOGRAPHY Right 09/03/2018   Procedure: LOWER EXTREMITY ANGIOGRAPHY;  Surgeon: Algernon Huxley, MD;  Location: [place] [facility];  Service: Cardiovascular;  Laterality: Right;   LOWER EXTREMITY ANGIOGRAPHY Right 09/04/2018   Procedure: Lower Extremity Angiography;  Surgeon: Algernon Huxley, MD;  Location: [place] [facility];  Service: Cardiovascular;  Laterality: Right;   LOWER EXTREMITY ANGIOGRAPHY Right 10/01/2018   Procedure: LOWER EXTREMITY ANGIOGRAPHY;  Surgeon: Algernon Huxley, MD;  Location: [place] [facility];  Service: Cardiovascular;  Laterality: Right;   LOWER EXTREMITY ANGIOGRAPHY Right 10/01/2018   Procedure: Lower Extremity Angiography;  Surgeon: Algernon Huxley, MD;  Location: [place] [facility];  Service: Cardiovascular;  Laterality: Right;   LOWER  EXTREMITY ANGIOGRAPHY Right 10/15/2018   Procedure: LOWER EXTREMITY ANGIOGRAPHY;  Surgeon: Algernon Huxley, MD;  Location: [place] [facility];  Service: Cardiovascular;  Laterality: Right;   LOWER EXTREMITY ANGIOGRAPHY Left 10/16/2018   Procedure: Lower Extremity Angiography;  Surgeon: Algernon Huxley, MD;  Location: [place] [facility];  Service: Cardiovascular;  Laterality: Left;   LOWER EXTREMITY ANGIOGRAPHY Left 01/20/2019   Procedure: LOWER EXTREMITY ANGIOGRAPHY;  Surgeon: Algernon Huxley, MD;  Location: [place] [facility];  Service: Cardiovascular;  Laterality: Left;   LOWER EXTREMITY ANGIOGRAPHY Right 01/21/2019   Procedure: Lower Extremity Angiography;  Surgeon: Algernon Huxley, MD;  Location: [place] [facility];  Service: Cardiovascular;  Laterality: Right;   LOWER EXTREMITY ANGIOGRAPHY Right 01/28/2019   Procedure: LOWER EXTREMITY ANGIOGRAPHY;  Surgeon: Algernon Huxley, MD;  Location: [place] [facility];  Service: Cardiovascular;  Laterality: Right;   LOWER EXTREMITY ANGIOGRAPHY Right 01/29/2019   Procedure: Lower Extremity Angiography;  Surgeon: Algernon Huxley, MD;  Location: [place] [facility];  Service: Cardiovascular;  Laterality: Right;   LOWER EXTREMITY ANGIOGRAPHY Right  04/04/2020   Procedure: LOWER EXTREMITY ANGIOGRAPHY;  Surgeon: Waynetta Sandy, MD;  Location: [place] [facility];  Service: Cardiovascular;  Laterality: Right;   LOWER EXTREMITY INTERVENTION Right 07/02/2018   Procedure: LOWER EXTREMITY INTERVENTION;  Surgeon: Algernon Huxley, MD;  Location: [place] [facility];  Service: Cardiovascular;  Laterality: Right;   PERIPHERAL VASCULAR BALLOON ANGIOPLASTY Left 04/04/2020   Procedure: PERIPHERAL VASCULAR BALLOON ANGIOPLASTY;  Surgeon: Waynetta Sandy, MD;  Location: [place] [facility];  Service: Cardiovascular;  Laterality: Left;  external iliac   POLYPECTOMY N/A 02/15/2016   Procedure: POLYPECTOMY;  Surgeon: Lucilla Lame, MD;  Location: [place];  Service:  Endoscopy;  Laterality: N/A;    Family History  Problem Relation Age of Onset   COPD Mother    Hypertension Father    Heart attack Brother     Social History   Socioeconomic History   Marital status: Single    Spouse name: [name]   Number of children: 5   Years of education: Not on file   Highest education level: High school graduate  Occupational History   Occupation: Forklift    Comment: [name]  Tobacco Use   Smoking status: Former    Packs/day: 0.50    Years: 40.00    Total pack years: 20.00    Types: Cigarettes    Start date: 07/21/1978    Quit date: 12/2018    Years since quitting: 3.0   Smokeless tobacco: Never   Tobacco comments:    Started using cocaine recently but states he will try to quit   Vaping Use   Vaping Use: Never used  Substance and Sexual Activity   Alcohol use: Not Currently    Alcohol/week: 12.0 standard drinks of alcohol    Types: 12 Cans of beer per week    Comment: 4 beers / week now per pt 05/05/20   Drug use: Yes    Frequency: 2.0 times per week    Types: Cocaine   Sexual activity: Yes    Partners: Female    Birth control/protection: None  Other Topics Concern   Not on file  Social History Narrative   Not on file   Social Determinants of Health   Financial Resource Strain: Low Risk  (03/12/2018)   Overall Financial Resource Strain (CARDIA)    Difficulty of Paying Living Expenses: Not hard at all  Food Insecurity: No Food Insecurity (03/12/2018)   Hunger Vital Sign    Worried About Running Out of Food in the Last Year: Never true    Ran Out of Food in the Last Year: Never true  Transportation Needs: No Transportation Needs (03/12/2018)   PRAPARE - Hydrologist (Medical): No    Lack of Transportation (Non-Medical): No  Physical Activity: Sufficiently Active (06/08/2018)   Exercise Vital Sign    Days of Exercise per Week: 6 days    Minutes of Exercise per Session: 60 min  Recent Concern: Physical  Activity - Inactive (03/12/2018)   Exercise Vital Sign    Days of Exercise per Week: 0 days    Minutes of Exercise per Session: 0 min  Stress: No Stress Concern Present (03/12/2018)   [place]    Feeling of Stress : Not at all  Social Connections: Somewhat Isolated (03/12/2018)   Social Connection and Isolation Panel [NHANES]    Frequency of Communication with Friends and Family: Three times a week  Frequency of Social Gatherings with Friends and Family: Twice a week    Attends Religious Services: Never    Marine scientist or Organizations: No    Attends Archivist Meetings: Never    Marital Status: Married  Human resources officer Violence: Not At Risk (03/12/2018)   Humiliation, Afraid, Rape, and Kick questionnaire    Fear of Current or Ex-Partner: No    Emotionally Abused: No    Physically Abused: No    Sexually Abused: No     Current Outpatient Medications:    albuterol (VENTOLIN HFA) 108 (90 Base) MCG/ACT inhaler, Inhale 2 puffs into the lungs every 6 (six) hours as needed for wheezing or shortness of breath., Disp: 18 g, Rfl: 0   amLODipine-valsartan (EXFORGE) 5-160 MG tablet, Take 1 tablet by mouth daily., Disp: 90 tablet, Rfl: 1   ascorbic acid (VITAMIN C) 500 MG tablet, Take 1 tablet (500 mg total) by mouth daily., Disp: 30 tablet, Rfl: 0   aspirin EC 81 MG tablet, Take 1 tablet (81 mg total) by mouth daily., Disp: 90 tablet, Rfl: 3   atorvastatin (LIPITOR) 40 MG tablet, Take 1 tablet (40 mg total) by mouth daily., Disp: 90 tablet, Rfl: 1   Cholecalciferol (DIALYVITE VITAMIN D 5000) 125 MCG (5000 UT) capsule, Take 5,000 Units by mouth daily., Disp: , Rfl:    clopidogrel (PLAVIX) 75 MG tablet, Take 1 tablet (75 mg total) by mouth daily., Disp: 90 tablet, Rfl: 1   Cyanocobalamin (B-12) 500 MCG SUBL, Place 1 tablet under the tongue daily., Disp: 30 tablet, Rfl: 0   DULoxetine (CYMBALTA) 60 MG capsule,  Take 1 capsule (60 mg total) by mouth daily., Disp: 90 capsule, Rfl: 1   Fluticasone-Umeclidin-Vilant (TRELEGY ELLIPTA) 100-62.5-25 MCG/ACT AEPB, Inhale 1 puff into the lungs daily., Disp: 3 each, Rfl: 1   gabapentin (NEURONTIN) 100 MG capsule, Take 1 capsule (100 mg total) by mouth 3 (three) times daily., Disp: 270 capsule, Rfl: 1   Multiple Vitamin (MULTIVITAMIN WITH MINERALS) TABS tablet, Take 1 tablet by mouth daily., Disp: , Rfl:    pantoprazole (PROTONIX) 20 MG tablet, Take 1 tablet (20 mg total) by mouth daily., Disp: 90 tablet, Rfl: 1   traZODone (DESYREL) 100 MG tablet, Take 1 tablet (100 mg total) by mouth at bedtime., Disp: 90 tablet, Rfl: 1  No Known Allergies  I personally reviewed active problem list, medication list, allergies, family history, social history, health maintenance with the patient/caregiver today.   ROS  Ten systems reviewed and is negative except as mentioned in HPI   Objective  Virtual encounter, vitals not obtained.  There is no height or weight on file to calculate BMI.  Physical Exam  Awake, alert and oriented   PHQ2/9:    01/24/2022    9:05 AM 10/22/2021   10:28 AM 08/15/2021    9:58 AM 05/21/2021   10:03 AM 01/17/2021    9:51 AM  Depression screen PHQ 2/9  Decreased Interest 0 0 0 0 0  Down, Depressed, Hopeless 0 0 0 0 0  PHQ - 2 Score 0 0 0 0 0  Altered sleeping 0 0 0 0   Tired, decreased energy 0 0 0 0   Change in appetite 0 0 0 0   Feeling bad or failure about yourself  0 0 0 0   Trouble concentrating 0 0 0 0   Moving slowly or fidgety/restless 0 0 0 0   Suicidal thoughts 0 0 0 0  PHQ-9 Score 0 0 0 0    PHQ-2/9 Result is negative.    Fall Risk:    01/24/2022    9:04 AM 10/22/2021   10:28 AM 08/15/2021    9:58 AM 05/21/2021   10:03 AM 01/17/2021    9:51 AM  Fall Risk   Falls in the past year? 0 0 0 0 0  Number falls in past yr: 0 0  0 0  Injury with Fall? 0 0  0 0  Risk for fall due to : No Fall Risks No Fall Risks  No Fall Risks    Follow up Falls prevention discussed Falls prevention discussed Falls prevention discussed Falls prevention discussed     Assessment & Plan  1. Stage 3a chronic kidney disease (HCC)  Stable  2. Phantom limb pain (HCC)  Stable   3. PAD (peripheral artery disease) ([place])  Keep follow up with Dr. [name]  4. Atherosclerosis of aorta ([place])  On statin therapy   5. History of alcoholism ([place])  He quit   6. History of amputation below knee, right (HCC)  Stable  7. Mild protein-calorie malnutrition ([place])  Discussed high calorie diet  8. Benign hypertension with chronic kidney disease, stage III ([place])   9. Aneurysm of ascending aorta without rupture ([place])   10. B12 deficiency   11. Hypertension, benign   12. Vitamin B1 deficiency    13. ED (erectile dysfunction) of organic origin  - tadalafil (CIALIS) 20 MG tablet; Take 0.5-1 tablets (10-20 mg total) by mouth every other day as needed for erectile dysfunction.  Dispense: 10 tablet; Refill: 2  I discussed the assessment and treatment plan with the patient. The patient was provided an opportunity to ask questions and all were answered. The patient agreed with the plan and demonstrated an understanding of the instructions.  The patient was advised to call back or seek an in-person evaluation if the symptoms worsen or if the condition fails to improve as anticipated.  I provided 25  minutes of non-face-to-face time during this encounter.

## 2022-01-24 ENCOUNTER — Telehealth (INDEPENDENT_AMBULATORY_CARE_PROVIDER_SITE_OTHER): Payer: 59 | Admitting: Family Medicine

## 2022-01-24 ENCOUNTER — Encounter: Payer: Self-pay | Admitting: Family Medicine

## 2022-01-24 DIAGNOSIS — I739 Peripheral vascular disease, unspecified: Secondary | ICD-10-CM | POA: Diagnosis not present

## 2022-01-24 DIAGNOSIS — I700 Atherosclerosis of aorta: Secondary | ICD-10-CM | POA: Diagnosis not present

## 2022-01-24 DIAGNOSIS — E440 Moderate protein-calorie malnutrition: Secondary | ICD-10-CM | POA: Insufficient documentation

## 2022-01-24 DIAGNOSIS — Z89511 Acquired absence of right leg below knee: Secondary | ICD-10-CM

## 2022-01-24 DIAGNOSIS — E519 Thiamine deficiency, unspecified: Secondary | ICD-10-CM

## 2022-01-24 DIAGNOSIS — G546 Phantom limb syndrome with pain: Secondary | ICD-10-CM

## 2022-01-24 DIAGNOSIS — I7121 Aneurysm of the ascending aorta, without rupture: Secondary | ICD-10-CM

## 2022-01-24 DIAGNOSIS — N1831 Chronic kidney disease, stage 3a: Secondary | ICD-10-CM

## 2022-01-24 DIAGNOSIS — E538 Deficiency of other specified B group vitamins: Secondary | ICD-10-CM

## 2022-01-24 DIAGNOSIS — F1021 Alcohol dependence, in remission: Secondary | ICD-10-CM

## 2022-01-24 DIAGNOSIS — I10 Essential (primary) hypertension: Secondary | ICD-10-CM

## 2022-01-24 DIAGNOSIS — N529 Male erectile dysfunction, unspecified: Secondary | ICD-10-CM

## 2022-01-24 DIAGNOSIS — E441 Mild protein-calorie malnutrition: Secondary | ICD-10-CM

## 2022-01-24 DIAGNOSIS — N183 Chronic kidney disease, stage 3 unspecified: Secondary | ICD-10-CM

## 2022-01-24 DIAGNOSIS — I129 Hypertensive chronic kidney disease with stage 1 through stage 4 chronic kidney disease, or unspecified chronic kidney disease: Secondary | ICD-10-CM

## 2022-01-24 MED ORDER — TADALAFIL 20 MG PO TABS: 10.0000 mg | ORAL_TABLET | ORAL | 2 refills | Status: DC | PRN

## 2022-05-02 ENCOUNTER — Other Ambulatory Visit: Payer: Self-pay | Admitting: Family Medicine

## 2022-05-02 ENCOUNTER — Ambulatory Visit: Payer: 59 | Admitting: Family Medicine

## 2022-05-02 DIAGNOSIS — G4709 Other insomnia: Secondary | ICD-10-CM

## 2022-05-09 NOTE — Progress Notes (Deleted)
Name: Ryan [name]   MRN: [id number]    DOB: March 15, 1956   Date:05/09/2022       Progress Note  Subjective  Chief Complaint  Follow Up  HPI  Phantom pains: still experiencing phantom pains but states they are "normal". It is aggravated by walking ( uses prosthesis) for a prolonged period of time    Malnutrition: he had lost almost 10 % of his body weight in the past 5 months, at the time he told me that the only change was that he was  doing cocaine a few times a week for the previous two months, he states he quit using cocaine over one month ago and weight is now stable, discussed high calorie diet   Dysthymia: States he is doing "good", taking Duloxetine and denies side effects . Denies sadness or depressed mood today. Phq 9 is negative   Insomnia: taking Trazodone 100 mg every night and is able to fall asleep around midnight and wakes up around 6 am, he also naps when watching TV. No side effects  HTN: He is on ARB, his last GFR was up from 31 to 38 . He denies chest pain or palpitation   Emphysema: he is taking Trelegy, states wheezing at night has decreased, still has some SOB when walking but stable. He has mild morning cough that is usually dry and not as severe  He had on flare earlier this year and had to take prednisone    Atherosclerosis of Ectasy ascending aorta: reviewed last CT and no aneurysm but  ascending aorta was 3.8 cm . He sees Dr. [name] for his PVD, but patient is not sure if Dr. [name] knows about the aneurysm.   Alcoholism: Still taking Vitamin B12 and B1 for deficiency.Taking B12 every other day. Last lab value was >2000. He is still off alcohol   ED: he took viagra in the past but would like to try cialis , he has difficulty starting and maintaining and erection   Patient Active Problem List   Diagnosis Date Noted   History of alcoholism ([place]) 01/24/2022   History of amputation below knee, right ([place]) 01/24/2022   Mild protein-calorie malnutrition ([place])  01/24/2022   Benign hypertension with chronic kidney disease, stage III ([place]) 01/24/2022   Aneurysm of ascending aorta without rupture ([place]) 01/24/2022   Stage 3a chronic kidney disease ([place]) 01/24/2022   Phantom limb pain ([place])    Below-knee amputation of right lower extremity ([place]) 05/17/2020   Iron deficiency anemia 11/23/2018   Stage 3 chronic kidney disease ([place]) 10/21/2018   Atherosclerosis of native arteries of extremity with rest pain ([place]) 10/13/2018   PAD (peripheral artery disease) ([place]) 07/21/2018   Claudication ([place]) 03/17/2018   B12 deficiency 03/12/2018   Vitamin B1 deficiency 03/12/2018   Varicose veins of leg with pain, right 03/12/2018   Enlarged thoracic aorta ([place]) 02/24/2018   Alcoholism /alcohol abuse 02/24/2018   Paresthesia 02/24/2018   Ectatic aorta ([place]) 02/20/2018   Emphysema lung ([place]) 02/20/2018   Atherosclerosis of aorta ([place]) 02/20/2018   Hypertension, benign 08/31/2015   Tobacco use 08/31/2015    Past Surgical History:  Procedure Laterality Date   ABDOMINAL AORTOGRAM W/LOWER EXTREMITY N/A 09/13/2019   Procedure: ABDOMINAL AORTOGRAM W/LOWER EXTREMITY;  Surgeon: Waynetta Sandy, MD;  Location: [facility];  Service: Cardiovascular;  Laterality: N/A;   AMPUTATION Right 05/09/2020   Procedure: RIGHT BELOW KNEE AMPUTATION;  Surgeon: Waynetta Sandy, MD;  Location: [facility];  Service:  Vascular;  Laterality: Right;  w/ a block   COLONOSCOPY WITH PROPOFOL N/A 02/15/2016   Procedure: COLONOSCOPY WITH PROPOFOL;  Surgeon: Lucilla Lame, MD;  Location: [facility];  Service: Endoscopy;  Laterality: N/A;   FEMORAL-POPLITEAL BYPASS GRAFT Right 09/14/2019   Procedure: BYPASS GRAFT FEMORAL-POPLITEAL ARTERY;  Surgeon: Waynetta Sandy, MD;  Location: [facility];  Service: Vascular;  Laterality: Right;   HERNIA REPAIR  1999   left inguinal   INSERTION OF ILIAC STENT Right 09/14/2019   Procedure: Insertion Of Common and External Iliac  Stent;  Surgeon: Waynetta Sandy, MD;  Location: [facility];  Service: Vascular;  Laterality: Right;   LOWER EXTREMITY ANGIOGRAPHY Right 05/07/2018   Procedure: LOWER EXTREMITY ANGIOGRAPHY;  Surgeon: Algernon Huxley, MD;  Location: [facility];  Service: Cardiovascular;  Laterality: Right;   LOWER EXTREMITY ANGIOGRAPHY Right 06/25/2018   Procedure: LOWER EXTREMITY ANGIOGRAPHY;  Surgeon: Algernon Huxley, MD;  Location: [facility];  Service: Cardiovascular;  Laterality: Right;   LOWER EXTREMITY ANGIOGRAPHY Right 07/01/2018   Procedure: LOWER EXTREMITY ANGIOGRAPHY;  Surgeon: Algernon Huxley, MD;  Location: [facility];  Service: Cardiovascular;  Laterality: Right;   LOWER EXTREMITY ANGIOGRAPHY Right 08/12/2018   Procedure: LOWER EXTREMITY ANGIOGRAPHY;  Surgeon: Algernon Huxley, MD;  Location: [facility];  Service: Cardiovascular;  Laterality: Right;   LOWER EXTREMITY ANGIOGRAPHY Right 09/03/2018   Procedure: LOWER EXTREMITY ANGIOGRAPHY;  Surgeon: Algernon Huxley, MD;  Location: [facility];  Service: Cardiovascular;  Laterality: Right;   LOWER EXTREMITY ANGIOGRAPHY Right 09/04/2018   Procedure: Lower Extremity Angiography;  Surgeon: Algernon Huxley, MD;  Location: [facility];  Service: Cardiovascular;  Laterality: Right;   LOWER EXTREMITY ANGIOGRAPHY Right 10/01/2018   Procedure: LOWER EXTREMITY ANGIOGRAPHY;  Surgeon: Algernon Huxley, MD;  Location: [facility];  Service: Cardiovascular;  Laterality: Right;   LOWER EXTREMITY ANGIOGRAPHY Right 10/01/2018   Procedure: Lower Extremity Angiography;  Surgeon: Algernon Huxley, MD;  Location: [facility];  Service: Cardiovascular;  Laterality: Right;   LOWER EXTREMITY ANGIOGRAPHY Right 10/15/2018   Procedure: LOWER EXTREMITY ANGIOGRAPHY;  Surgeon: Algernon Huxley, MD;  Location: [facility];  Service: Cardiovascular;  Laterality: Right;   LOWER EXTREMITY ANGIOGRAPHY Left 10/16/2018   Procedure: Lower  Extremity Angiography;  Surgeon: Algernon Huxley, MD;  Location: [facility];  Service: Cardiovascular;  Laterality: Left;   LOWER EXTREMITY ANGIOGRAPHY Left 01/20/2019   Procedure: LOWER EXTREMITY ANGIOGRAPHY;  Surgeon: Algernon Huxley, MD;  Location: [facility];  Service: Cardiovascular;  Laterality: Left;   LOWER EXTREMITY ANGIOGRAPHY Right 01/21/2019   Procedure: Lower Extremity Angiography;  Surgeon: Algernon Huxley, MD;  Location: [facility];  Service: Cardiovascular;  Laterality: Right;   LOWER EXTREMITY ANGIOGRAPHY Right 01/28/2019   Procedure: LOWER EXTREMITY ANGIOGRAPHY;  Surgeon: Algernon Huxley, MD;  Location: [facility];  Service: Cardiovascular;  Laterality: Right;   LOWER EXTREMITY ANGIOGRAPHY Right 01/29/2019   Procedure: Lower Extremity Angiography;  Surgeon: Algernon Huxley, MD;  Location: [facility];  Service: Cardiovascular;  Laterality: Right;   LOWER EXTREMITY ANGIOGRAPHY Right 04/04/2020   Procedure: LOWER EXTREMITY ANGIOGRAPHY;  Surgeon: Waynetta Sandy, MD;  Location: [facility];  Service: Cardiovascular;  Laterality: Right;   LOWER EXTREMITY INTERVENTION Right 07/02/2018   Procedure: LOWER EXTREMITY INTERVENTION;  Surgeon: Algernon Huxley, MD;  [facility];  Service: Cardiovascular;  Laterality: Right;   PERIPHERAL VASCULAR BALLOON ANGIOPLASTY Left 04/04/2020   Procedure: PERIPHERAL VASCULAR BALLOON ANGIOPLASTY;  Surgeon: Waynetta Sandy, MD;  Location: [facility];  Service: Cardiovascular;  Laterality: Left;  external iliac   POLYPECTOMY N/A 02/15/2016   Procedure: POLYPECTOMY;  Surgeon: Lucilla Lame, MD;  Location: [facility];  Service: Endoscopy;  Laterality: N/A;    Family History  Problem Relation Age of Onset   COPD Mother    Hypertension Father    Heart attack Brother     Social History   Tobacco Use   Smoking status: Former    Packs/day: 0.50    Years: 40.00    Total  pack years: 20.00    Types: Cigarettes    Start date: 07/21/1978    Quit date: 12/2018    Years since quitting: 3.3   Smokeless tobacco: Never   Tobacco comments:    Started using cocaine recently but states he will try to quit   Substance Use Topics   Alcohol use: Not Currently    Alcohol/week: 12.0 standard drinks of alcohol    Types: 12 Cans of beer per week    Comment: 4 beers / week now per pt 05/05/20     Current Outpatient Medications:    albuterol (VENTOLIN HFA) 108 (90 Base) MCG/ACT inhaler, Inhale 2 puffs into the lungs every 6 (six) hours as needed for wheezing or shortness of breath., Disp: 18 g, Rfl: 0   amLODipine-valsartan (EXFORGE) 5-160 MG tablet, Take 1 tablet by mouth daily., Disp: 90 tablet, Rfl: 1   ascorbic acid (VITAMIN C) 500 MG tablet, Take 1 tablet (500 mg total) by mouth daily., Disp: 30 tablet, Rfl: 0   aspirin EC 81 MG tablet, Take 1 tablet (81 mg total) by mouth daily., Disp: 90 tablet, Rfl: 3   atorvastatin (LIPITOR) 40 MG tablet, Take 1 tablet (40 mg total) by mouth daily., Disp: 90 tablet, Rfl: 1   Cholecalciferol (DIALYVITE VITAMIN D 5000) 125 MCG (5000 UT) capsule, Take 5,000 Units by mouth daily., Disp: , Rfl:    clopidogrel (PLAVIX) 75 MG tablet, Take 1 tablet (75 mg total) by mouth daily., Disp: 90 tablet, Rfl: 1   Cyanocobalamin (B-12) 500 MCG SUBL, Place 1 tablet under the tongue daily., Disp: 30 tablet, Rfl: 0   DULoxetine (CYMBALTA) 60 MG capsule, Take 1 capsule (60 mg total) by mouth daily., Disp: 90 capsule, Rfl: 1   Fluticasone-Umeclidin-Vilant (TRELEGY ELLIPTA) 100-62.5-25 MCG/ACT AEPB, Inhale 1 puff into the lungs daily., Disp: 3 each, Rfl: 1   gabapentin (NEURONTIN) 100 MG capsule, Take 1 capsule (100 mg total) by mouth 3 (three) times daily., Disp: 270 capsule, Rfl: 1   Multiple Vitamin (MULTIVITAMIN WITH MINERALS) TABS tablet, Take 1 tablet by mouth daily., Disp: , Rfl:    pantoprazole (PROTONIX) 20 MG tablet, Take 1 tablet (20 mg total)  by mouth daily., Disp: 90 tablet, Rfl: 1   tadalafil (CIALIS) 20 MG tablet, Take 0.5-1 tablets (10-20 mg total) by mouth every other day as needed for erectile dysfunction., Disp: 10 tablet, Rfl: 2   traZODone (DESYREL) 100 MG tablet, TAKE 1 TABLET BY MOUTH AT BEDTIME, Disp: 90 tablet, Rfl: 0  No Known Allergies  I personally reviewed active problem list, medication list, allergies, family history, social history, health maintenance with the patient/caregiver today.   ROS  ***  Objective  There were no vitals filed for this visit.  There is  no height or weight on file to calculate BMI.  Physical Exam ***  No results found for this or any previous visit (from the past 2160 hour(s)).   PHQ2/9:    01/24/2022    9:05 AM 10/22/2021   10:28 AM 08/15/2021    9:58 AM 05/21/2021   10:03 AM 01/17/2021    9:51 AM  Depression screen PHQ 2/9  Decreased Interest 0 0 0 0 0  Down, Depressed, Hopeless 0 0 0 0 0  PHQ - 2 Score 0 0 0 0 0  Altered sleeping 0 0 0 0   Tired, decreased energy 0 0 0 0   Change in appetite 0 0 0 0   Feeling bad or failure about yourself  0 0 0 0   Trouble concentrating 0 0 0 0   Moving slowly or fidgety/restless 0 0 0 0   Suicidal thoughts 0 0 0 0   PHQ-9 Score 0 0 0 0     phq 9 is {gen pos [id number]}   Fall Risk:    01/24/2022    9:04 AM 10/22/2021   10:28 AM 08/15/2021    9:58 AM 05/21/2021   10:03 AM 01/17/2021    9:51 AM  Fall Risk   Falls in the past year? 0 0 0 0 0  Number falls in past yr: 0 0  0 0  Injury with Fall? 0 0  0 0  Risk for fall due to : No Fall Risks No Fall Risks  No Fall Risks   Follow up Falls prevention discussed Falls prevention discussed Falls prevention discussed Falls prevention discussed       Functional Status Survey:      Assessment & Plan  *** There are no diagnoses linked to this encounter.

## 2022-05-10 ENCOUNTER — Ambulatory Visit: Payer: 59 | Admitting: Family Medicine

## 2022-05-12 ENCOUNTER — Ambulatory Visit
Admission: EM | Admit: 2022-05-12 | Discharge: 2022-05-12 | Disposition: A | Discharge status: Home / Self Care | Payer: Medicare Other | Attending: Internal Medicine | Admitting: Internal Medicine

## 2022-05-12 ENCOUNTER — Encounter: Payer: Self-pay | Admitting: Emergency Medicine

## 2022-05-12 DIAGNOSIS — R051 Acute cough: Secondary | ICD-10-CM | POA: Insufficient documentation

## 2022-05-12 DIAGNOSIS — M25512 Pain in left shoulder: Secondary | ICD-10-CM | POA: Insufficient documentation

## 2022-05-12 DIAGNOSIS — M25511 Pain in right shoulder: Secondary | ICD-10-CM | POA: Diagnosis present

## 2022-05-12 DIAGNOSIS — Z20822 Contact with and (suspected) exposure to covid-19: Secondary | ICD-10-CM | POA: Diagnosis present

## 2022-05-12 MED ORDER — BENZONATATE 100 MG PO CAPS: 100.0000 mg | ORAL_CAPSULE | Freq: Three times a day (TID) | ORAL | 0 refills | Status: DC | PRN

## 2022-05-12 NOTE — ED Provider Notes (Signed)
[facility]    CSN: [id number] Arrival date & time: 05/12/22  0820      History   Chief Complaint Chief Complaint  Patient presents with   Headache   Shoulder Pain    Bilateral     HPI Ryan [name] is a 66 y.o. male.   Patient presents today due to concerns for COVID virus.  Patient reports that his daughter who lives with him recently tested positive for COVID-19.  Patient states that he developed a cough yesterday but denies nasal congestion, runny nose, sore throat, ear pain, chest pain, shortness of breath, nausea, vomiting, diarrhea, abdominal pain.  He does endorse that he has had headache in the front part of his head that is very mild as well as bilateral shoulder pain.  He describes the shoulder pain as "achy" and feels very similar to body aches.  He denies any injury to his shoulders or any chronic pain in that area.  He has not taken any medications for pain or cough.  He does report that he has a history of asthma and smokes cigarettes.  He uses Trelegy inhaler.   Headache Shoulder Pain   Past Medical History:  Diagnosis Date   CKD (chronic kidney disease)    History of blood transfusion    Hyperlipidemia    Hypertension    Neuromuscular disorder (HCC)    numbness feet   Peripheral vascular disease (HCC)    Shortness of breath dyspnea    occasional     Patient Active Problem List   Diagnosis Date Noted   History of alcoholism ([place]) 01/24/2022   History of amputation below knee, right ([place]) 01/24/2022   Mild protein-calorie malnutrition ([place]) 01/24/2022   Benign hypertension with chronic kidney disease, stage III ([place]) 01/24/2022   Aneurysm of ascending aorta without rupture ([place]) 01/24/2022   Stage 3a chronic kidney disease ([place]) 01/24/2022   Phantom limb pain ([place])    Below-knee amputation of right lower extremity ([place]) 05/17/2020   Iron deficiency anemia 11/23/2018   Stage 3 chronic kidney disease ([place]) 10/21/2018   Atherosclerosis  of native arteries of extremity with rest pain ([place]) 10/13/2018   PAD (peripheral artery disease) ([place]) 07/21/2018   Claudication ([place]) 03/17/2018   B12 deficiency 03/12/2018   Vitamin B1 deficiency 03/12/2018   Varicose veins of leg with pain, right 03/12/2018   Enlarged thoracic aorta ([place]) 02/24/2018   Alcoholism /alcohol abuse 02/24/2018   Paresthesia 02/24/2018   Ectatic aorta ([place]) 02/20/2018   Emphysema lung ([place]) 02/20/2018   Atherosclerosis of aorta ([place]) 02/20/2018   Hypertension, benign 08/31/2015   Tobacco use 08/31/2015    Past Surgical History:  Procedure Laterality Date   ABDOMINAL AORTOGRAM W/LOWER EXTREMITY N/A 09/13/2019   Procedure: ABDOMINAL AORTOGRAM W/LOWER EXTREMITY;  Surgeon: Waynetta Sandy, MD;  Location: [facility];  Service: Cardiovascular;  Laterality: N/A;   AMPUTATION Right 05/09/2020   Procedure: RIGHT BELOW KNEE AMPUTATION;  Surgeon: Waynetta Sandy, MD;  Location: [place];  Service: Vascular;  Laterality: Right;  w/ a block   COLONOSCOPY WITH PROPOFOL N/A 02/15/2016   Procedure: COLONOSCOPY WITH PROPOFOL;  Surgeon: Lucilla Lame, MD;  Location: [place];  Service: Endoscopy;  Laterality: N/A;   FEMORAL-POPLITEAL BYPASS GRAFT Right 09/14/2019   Procedure: BYPASS GRAFT FEMORAL-POPLITEAL ARTERY;  Surgeon: Waynetta Sandy, MD;  Location: [place];  Service: Vascular;  Laterality: Right;   HERNIA REPAIR  1999   left inguinal   INSERTION OF  ILIAC STENT Right 09/14/2019   Procedure: Insertion Of Common and External Iliac Stent;  Surgeon: Waynetta Sandy, MD;  Location: [place];  Service: Vascular;  Laterality: Right;   LOWER EXTREMITY ANGIOGRAPHY Right 05/07/2018   Procedure: LOWER EXTREMITY ANGIOGRAPHY;  Surgeon: Algernon Huxley, MD;  Location: [facility];  Service: Cardiovascular;  Laterality: Right;   LOWER EXTREMITY ANGIOGRAPHY Right 06/25/2018   Procedure: LOWER EXTREMITY ANGIOGRAPHY;  Surgeon: Algernon Huxley, MD;  Location: [place] [facility];  Service: Cardiovascular;  Laterality: Right;   LOWER EXTREMITY ANGIOGRAPHY Right 07/01/2018   Procedure: LOWER EXTREMITY ANGIOGRAPHY;  Surgeon: Algernon Huxley, MD;  Location: [place] [facility];  Service: Cardiovascular;  Laterality: Right;   LOWER EXTREMITY ANGIOGRAPHY Right 08/12/2018   Procedure: LOWER EXTREMITY ANGIOGRAPHY;  Surgeon: Algernon Huxley, MD;  Location: [place];  Service: Cardiovascular;  Laterality: Right;   LOWER EXTREMITY ANGIOGRAPHY Right 09/03/2018   Procedure: LOWER EXTREMITY ANGIOGRAPHY;  Surgeon: Algernon Huxley, MD;  Location: [place] [facility];  Service: Cardiovascular;  Laterality: Right;   LOWER EXTREMITY ANGIOGRAPHY Right 09/04/2018   Procedure: Lower Extremity Angiography;  Surgeon: Algernon Huxley, MD;  Location: [facility];  Service: Cardiovascular;  Laterality: Right;   LOWER EXTREMITY ANGIOGRAPHY Right 10/01/2018   Procedure: LOWER EXTREMITY ANGIOGRAPHY;  Surgeon: Algernon Huxley, MD;  Location: [place] [facility];  Service: Cardiovascular;  Laterality: Right;   LOWER EXTREMITY ANGIOGRAPHY Right 10/01/2018   Procedure: Lower Extremity Angiography;  Surgeon: Algernon Huxley, MD;  Location: [facility];  Service: Cardiovascular;  Laterality: Right;   LOWER EXTREMITY ANGIOGRAPHY Right 10/15/2018   Procedure: LOWER EXTREMITY ANGIOGRAPHY;  Surgeon: Algernon Huxley, MD;  Location: [place] [facility];  Service: Cardiovascular;  Laterality: Right;   LOWER EXTREMITY ANGIOGRAPHY Left 10/16/2018   Procedure: Lower Extremity Angiography;  Surgeon: Algernon Huxley, MD;  Location: [facility];  Service: Cardiovascular;  Laterality: Left;   LOWER EXTREMITY ANGIOGRAPHY Left 01/20/2019   Procedure: LOWER EXTREMITY ANGIOGRAPHY;  Surgeon: Algernon Huxley, MD;  Location: [facility] [facility];  Service: Cardiovascular;  Laterality: Left;   LOWER EXTREMITY ANGIOGRAPHY Right 01/21/2019   Procedure: Lower Extremity Angiography;   Surgeon: Algernon Huxley, MD;  Location: [facility];  Service: Cardiovascular;  Laterality: Right;   LOWER EXTREMITY ANGIOGRAPHY Right 01/28/2019   Procedure: LOWER EXTREMITY ANGIOGRAPHY;  Surgeon: Algernon Huxley, MD;  Location: [facility];  Service: Cardiovascular;  Laterality: Right;   LOWER EXTREMITY ANGIOGRAPHY Right 01/29/2019   Procedure: Lower Extremity Angiography;  Surgeon: Algernon Huxley, MD;  Location: [facility];  Service: Cardiovascular;  Laterality: Right;   LOWER EXTREMITY ANGIOGRAPHY Right 04/04/2020   Procedure: LOWER EXTREMITY ANGIOGRAPHY;  Surgeon: Waynetta Sandy, MD;  Location: [place] [facility];  Service: Cardiovascular;  Laterality: Right;   LOWER EXTREMITY INTERVENTION Right 07/02/2018   Procedure: LOWER EXTREMITY INTERVENTION;  Surgeon: Algernon Huxley, MD;  Location: [place] [facility];  Service: Cardiovascular;  Laterality: Right;   PERIPHERAL VASCULAR BALLOON ANGIOPLASTY Left 04/04/2020   Procedure: PERIPHERAL VASCULAR BALLOON ANGIOPLASTY;  Surgeon: Waynetta Sandy, MD;  Location: [facility];  Service: Cardiovascular;  Laterality: Left;  external iliac   POLYPECTOMY N/A 02/15/2016   Procedure: POLYPECTOMY;  Surgeon: Lucilla Lame, MD;  Location: [place];  Service: Endoscopy;  Laterality: N/A;       Home Medications  Prior to Admission medications   Medication Sig Start Date End Date Taking? Authorizing Provider  benzonatate (TESSALON) 100 MG capsule Take 1 capsule (100 mg total) by mouth every 8 (eight) hours as needed for cough. 05/12/22  Yes Tevan Marian, Michele Rockers, FNP  albuterol (VENTOLIN HFA) 108 (90 Base) MCG/ACT inhaler Inhale 2 puffs into the lungs every 6 (six) hours as needed for wheezing or shortness of breath. 10/18/20   Sowles, Drue Stager, MD  amLODipine-valsartan (EXFORGE) 5-160 MG tablet Take 1 tablet by mouth daily. 10/22/21   Steele Sizer, MD  ascorbic acid (VITAMIN C) 500 MG tablet Take 1 tablet (500 mg  total) by mouth daily. 05/25/20   Love, Ivan Anchors, PA-C  aspirin EC 81 MG tablet Take 1 tablet (81 mg total) by mouth daily. 08/13/18   Stegmayer, Joelene Millin A, PA-C  atorvastatin (LIPITOR) 40 MG tablet Take 1 tablet (40 mg total) by mouth daily. 10/22/21   Steele Sizer, MD  Cholecalciferol (DIALYVITE VITAMIN D 5000) 125 MCG (5000 UT) capsule Take 5,000 Units by mouth daily.    [provider]  clopidogrel (PLAVIX) 75 MG tablet Take 1 tablet (75 mg total) by mouth daily. 10/22/21   Steele Sizer, MD  Cyanocobalamin (B-12) 500 MCG SUBL Place 1 tablet under the tongue daily. 03/14/20   Steele Sizer, MD  DULoxetine (CYMBALTA) 60 MG capsule Take 1 capsule (60 mg total) by mouth daily. 10/22/21   Steele Sizer, MD  Fluticasone-Umeclidin-Vilant (TRELEGY ELLIPTA) 100-62.5-25 MCG/ACT AEPB Inhale 1 puff into the lungs daily. 10/22/21   Steele Sizer, MD  gabapentin (NEURONTIN) 100 MG capsule Take 1 capsule (100 mg total) by mouth 3 (three) times daily. 10/22/21   Steele Sizer, MD  Multiple Vitamin (MULTIVITAMIN WITH MINERALS) TABS tablet Take 1 tablet by mouth daily. 05/25/20   Love, Ivan Anchors, PA-C  pantoprazole (PROTONIX) 20 MG tablet Take 1 tablet (20 mg total) by mouth daily. 10/22/21   Steele Sizer, MD  tadalafil (CIALIS) 20 MG tablet Take 0.5-1 tablets (10-20 mg total) by mouth every other day as needed for erectile dysfunction. 01/24/22   Steele Sizer, MD  traZODone (DESYREL) 100 MG tablet TAKE 1 TABLET BY MOUTH AT BEDTIME 05/02/22   Steele Sizer, MD    Family History Family History  Problem Relation Age of Onset   COPD Mother    Hypertension Father    Heart attack Brother     Social History Social History   Tobacco Use   Smoking status: Former    Packs/day: 0.50    Years: 40.00    Total pack years: 20.00    Types: Cigarettes    Start date: 07/21/1978    Quit date: 12/2018    Years since quitting: 3.3   Smokeless tobacco: Never   Tobacco comments:    Started using cocaine  recently but states he will try to quit   Vaping Use   Vaping Use: Never used  Substance Use Topics   Alcohol use: Not Currently    Alcohol/week: 12.0 standard drinks of alcohol    Types: 12 Cans of beer per week    Comment: 4 beers / week now per pt 05/05/20   Drug use: Yes    Frequency: 2.0 times per week    Types: Cocaine     Allergies   Patient has no known allergies.   Review of Systems Review of Systems Per HPI  Physical Exam Triage Vital Signs ED Triage Vitals [05/12/22 0832]  Enc Vitals Group  BP (!) 145/82     Pulse Rate 90     Resp 18     Temp 99.8 F (37.7 C)     Temp src      SpO2 96 %     Weight      Height      Head Circumference      Peak Flow      Pain Score 6     Pain Loc      Pain Edu?      Excl. in [place]?    No data found.  Updated Vital Signs BP (!) 145/82   Pulse 90   Temp 99.8 F (37.7 C)   Resp 18   SpO2 96%   Visual Acuity Right Eye Distance:   Left Eye Distance:   Bilateral Distance:    Right Eye Near:   Left Eye Near:    Bilateral Near:     Physical Exam Constitutional:      General: He is not in acute distress.    Appearance: Normal appearance. He is not toxic-appearing or diaphoretic.  HENT:     Head: Normocephalic and atraumatic.     Right Ear: Tympanic membrane and ear canal normal.     Left Ear: Tympanic membrane and ear canal normal.     Nose: No congestion.     Mouth/Throat:     Mouth: Mucous membranes are moist.     Pharynx: No posterior oropharyngeal erythema.  Eyes:     Extraocular Movements: Extraocular movements intact.     Conjunctiva/sclera: Conjunctivae normal.     Pupils: Pupils are equal, round, and reactive to light.  Cardiovascular:     Rate and Rhythm: Normal rate and regular rhythm.     Pulses: Normal pulses.     Heart sounds: Normal heart sounds.  Pulmonary:     Effort: Pulmonary effort is normal. No respiratory distress.     Breath sounds: Normal breath sounds. No stridor. No wheezing,  rhonchi or rales.  Abdominal:     General: Abdomen is flat. Bowel sounds are normal.     Palpations: Abdomen is soft.  Musculoskeletal:        General: Normal range of motion.     Cervical back: Normal range of motion.       Back:     Comments: Tenderness to palpation to right upper thoracic back.  No obvious swelling, discoloration, lacerations, abrasions noted.  No direct spinal tenderness or crepitus noted.  Grip strength 5/5.  Full range of motion of upper extremities.  Right lower extremity amputation.   Skin:    General: Skin is warm and dry.  Neurological:     General: No focal deficit present.     Mental Status: He is alert and oriented to person, place, and time. Mental status is at baseline.     Cranial Nerves: Cranial nerves 2-12 are intact.     Sensory: Sensation is intact.     Motor: Motor function is intact.     Coordination: Coordination is intact.     Comments: RLE amputation.   Psychiatric:        Mood and Affect: Mood normal.        Behavior: Behavior normal.      UC Treatments / Results  Labs (all labs ordered are listed, but only abnormal results are displayed) Labs Reviewed  SARS CORONAVIRUS 2 (TAT 6-24 HRS)    EKG   Radiology No results found.  Procedures Procedures (including  critical care time)  Medications Ordered in UC Medications - No data to display  Initial Impression / Assessment and Plan / UC Course  I have reviewed the triage vital signs and the nursing notes.  Pertinent labs & imaging results that were available during my care of the patient were reviewed by me and considered in my medical decision making (see chart for details).     I am highly suspicious that patient could have COVID-19 given close exposure and new onset cough.  Shoulder pain could be due to musculoskeletal etiology or generalized body aches.  He does have tenderness to palpation which is suspicious of musculoskeletal etiology.  Patient appears to have CKD so  advised to avoid ibuprofen or NSAIDs.  Patient also reports that he has been told in the past to avoid Tylenol but he is not sure why.  Therefore, advised supportive care to help alleviate shoulder pain.  Patient sent cough medication to alleviate cough.  No suspicion for asthma exacerbation or complications as no adventitious lung sounds on exam or signs of respiratory compromise.  COVID test is pending.  Patient may qualify for molnupiravir if he is interested and inside the treatment window if COVID test is positive given that he has CKD stage III.  Patient was given strict return precautions.  Patient verbalized understanding and was agreeable with plan. Final Clinical Impressions(s) / UC Diagnoses   Final diagnoses:  Acute cough  Close exposure to COVID-19 virus  Acute pain of both shoulders     Discharge Instructions      It is likely that you have COVID-19 or could be a different type of virus.  COVID test is pending.  We will call if it is positive.  I have prescribed you cough medication to take as needed.  Please follow-up if symptoms persist or worsen.    ED Prescriptions     Medication Sig Dispense Auth. Provider   benzonatate (TESSALON) 100 MG capsule Take 1 capsule (100 mg total) by mouth every 8 (eight) hours as needed for cough. 21 capsule [place], Michele Rockers, [place]      PDMP not reviewed this encounter.   Teodora Medici, [place] 05/12/22 [phone]

## 2022-05-12 NOTE — ED Triage Notes (Signed)
Pt is present today with concerns for covid. Pt states that he was recently exposed and now experiencing a HA and bilateral shoulder pain. Pt sx started this morning.

## 2022-05-12 NOTE — Discharge Instructions (Addendum)
It is likely that you have COVID-19 or could be a different type of virus.  COVID test is pending.  We will call if it is positive.  I have prescribed you cough medication to take as needed.  Please follow-up if symptoms persist or worsen.

## 2022-05-13 NOTE — Progress Notes (Deleted)
Name: Ryan [name]   MRN: [id number]    DOB: [birth year]   Date:05/13/2022       Progress Note  Subjective  Chief Complaint  Follow Up  HPI  Phantom pains: still experiencing phantom pains but states they are "normal". It is aggravated by walking ( uses prosthesis) for a prolonged period of time    Malnutrition: he had lost almost 10 % of his body weight in the past 5 months, at the time he told me that the only change was that he was  doing cocaine a few times a week for the previous two months, he states he quit using cocaine over one month ago and weight is now stable, discussed high calorie diet   Dysthymia: States he is doing "good", taking Duloxetine and denies side effects . Denies sadness or depressed mood today. Phq 9 is negative   Insomnia: taking Trazodone 100 mg every night and is able to fall asleep around midnight and wakes up around 6 am, he also naps when watching TV. No side effects  HTN: He is on ARB, his last GFR was up from 31 to 38 . He denies chest pain or palpitation   Emphysema: he is taking Trelegy, states wheezing at night has decreased, still has some SOB when walking but stable. He has mild morning cough that is usually dry and not as severe  He had on flare earlier this year and had to take prednisone    Atherosclerosis of Ectasy ascending aorta: reviewed last CT and no aneurysm but  ascending aorta was 3.8 cm . He sees Dr. [name] for his PVD, but patient is not sure if Dr. [name] knows about the aneurysm.   Alcoholism: Still taking Vitamin B12 and B1 for deficiency.Taking B12 every other day. Last lab value was >2000. He is still off alcohol   ED: he took viagra in the past but would like to try cialis , he has difficulty starting and maintaining and erection   Patient Active Problem List   Diagnosis Date Noted   History of alcoholism ([place]) 01/24/2022   History of amputation below knee, right ([place]) 01/24/2022   Mild protein-calorie malnutrition ([place])  01/24/2022   Benign hypertension with chronic kidney disease, stage III ([place]) 01/24/2022   Aneurysm of ascending aorta without rupture ([place]) 01/24/2022   Stage 3a chronic kidney disease ([place]) 01/24/2022   Phantom limb pain ([place])    Below-knee amputation of right lower extremity ([place]) 05/17/2020   Iron deficiency anemia 11/23/2018   Stage 3 chronic kidney disease ([place]) 10/21/2018   Atherosclerosis of native arteries of extremity with rest pain ([place]) 10/13/2018   PAD (peripheral artery disease) ([place]) 07/21/2018   Claudication ([place]) 03/17/2018   B12 deficiency 03/12/2018   Vitamin B1 deficiency 03/12/2018   Varicose veins of leg with pain, right 03/12/2018   Enlarged thoracic aorta ([place]) 02/24/2018   Alcoholism /alcohol abuse 02/24/2018   Paresthesia 02/24/2018   Ectatic aorta ([place]) 02/20/2018   Emphysema lung ([place]) 02/20/2018   Atherosclerosis of aorta ([place]) 02/20/2018   Hypertension, benign 08/31/2015   Tobacco use 08/31/2015    Past Surgical History:  Procedure Laterality Date   ABDOMINAL AORTOGRAM W/LOWER EXTREMITY N/A 09/13/2019   Procedure: ABDOMINAL AORTOGRAM W/LOWER EXTREMITY;  Surgeon: Waynetta Sandy, MD;  Location: [facility];  Service: Cardiovascular;  Laterality: N/A;   AMPUTATION Right 05/09/2020   Procedure: RIGHT BELOW KNEE AMPUTATION;  Surgeon: Waynetta Sandy, MD;  Location: [facility];  Service:  Vascular;  Laterality: Right;  w/ a block   COLONOSCOPY WITH PROPOFOL N/A 02/15/2016   Procedure: COLONOSCOPY WITH PROPOFOL;  Surgeon: Lucilla Lame, MD;  Location: [facility];  Service: Endoscopy;  Laterality: N/A;   FEMORAL-POPLITEAL BYPASS GRAFT Right 09/14/2019   Procedure: BYPASS GRAFT FEMORAL-POPLITEAL ARTERY;  Surgeon: Waynetta Sandy, MD;  Location: [facility];  Service: Vascular;  Laterality: Right;   HERNIA REPAIR  1999   left inguinal   INSERTION OF ILIAC STENT Right 09/14/2019   Procedure: Insertion Of Common and External Iliac  Stent;  Surgeon: Waynetta Sandy, MD;  Location: [facility];  Service: Vascular;  Laterality: Right;   LOWER EXTREMITY ANGIOGRAPHY Right 05/07/2018   Procedure: LOWER EXTREMITY ANGIOGRAPHY;  Surgeon: Algernon Huxley, MD;  Location: [facility];  Service: Cardiovascular;  Laterality: Right;   LOWER EXTREMITY ANGIOGRAPHY Right 06/25/2018   Procedure: LOWER EXTREMITY ANGIOGRAPHY;  Surgeon: Algernon Huxley, MD;  Location: [facility];  Service: Cardiovascular;  Laterality: Right;   LOWER EXTREMITY ANGIOGRAPHY Right 07/01/2018   Procedure: LOWER EXTREMITY ANGIOGRAPHY;  Surgeon: Algernon Huxley, MD;  Location: [facility];  Service: Cardiovascular;  Laterality: Right;   LOWER EXTREMITY ANGIOGRAPHY Right 08/12/2018   Procedure: LOWER EXTREMITY ANGIOGRAPHY;  Surgeon: Algernon Huxley, MD;  Location: [facility];  Service: Cardiovascular;  Laterality: Right;   LOWER EXTREMITY ANGIOGRAPHY Right 09/03/2018   Procedure: LOWER EXTREMITY ANGIOGRAPHY;  Surgeon: Algernon Huxley, MD;  Location: [facility];  Service: Cardiovascular;  Laterality: Right;   LOWER EXTREMITY ANGIOGRAPHY Right 09/04/2018   Procedure: Lower Extremity Angiography;  Surgeon: Algernon Huxley, MD;  Location: [facility];  Service: Cardiovascular;  Laterality: Right;   LOWER EXTREMITY ANGIOGRAPHY Right 10/01/2018   Procedure: LOWER EXTREMITY ANGIOGRAPHY;  Surgeon: Algernon Huxley, MD;  Location: [facility];  Service: Cardiovascular;  Laterality: Right;   LOWER EXTREMITY ANGIOGRAPHY Right 10/01/2018   Procedure: Lower Extremity Angiography;  Surgeon: Algernon Huxley, MD;  Location: [facility];  Service: Cardiovascular;  Laterality: Right;   LOWER EXTREMITY ANGIOGRAPHY Right 10/15/2018   Procedure: LOWER EXTREMITY ANGIOGRAPHY;  Surgeon: Algernon Huxley, MD;  Location: [facility];  Service: Cardiovascular;  Laterality: Right;   LOWER EXTREMITY ANGIOGRAPHY Left 10/16/2018   Procedure: Lower  Extremity Angiography;  Surgeon: Algernon Huxley, MD;  Location: [facility];  Service: Cardiovascular;  Laterality: Left;   LOWER EXTREMITY ANGIOGRAPHY Left 01/20/2019   Procedure: LOWER EXTREMITY ANGIOGRAPHY;  Surgeon: Algernon Huxley, MD;  Location: [facility];  Service: Cardiovascular;  Laterality: Left;   LOWER EXTREMITY ANGIOGRAPHY Right 01/21/2019   Procedure: Lower Extremity Angiography;  Surgeon: Algernon Huxley, MD;  Location: [facility];  Service: Cardiovascular;  Laterality: Right;   LOWER EXTREMITY ANGIOGRAPHY Right 01/28/2019   Procedure: LOWER EXTREMITY ANGIOGRAPHY;  Surgeon: Algernon Huxley, MD;  Location: [facility];  Service: Cardiovascular;  Laterality: Right;   LOWER EXTREMITY ANGIOGRAPHY Right 01/29/2019   Procedure: Lower Extremity Angiography;  Surgeon: Algernon Huxley, MD;  Location: [facility];  Service: Cardiovascular;  Laterality: Right;   LOWER EXTREMITY ANGIOGRAPHY Right 04/04/2020   Procedure: LOWER EXTREMITY ANGIOGRAPHY;  Surgeon: Waynetta Sandy, MD;  Location: [facility];  Service: Cardiovascular;  Laterality: Right;   LOWER EXTREMITY INTERVENTION Right 07/02/2018   Procedure: LOWER EXTREMITY INTERVENTION;  Surgeon: Algernon Huxley, MD;  Location: [facility];  Service: Cardiovascular;  Laterality: Right;   PERIPHERAL VASCULAR BALLOON ANGIOPLASTY Left 04/04/2020   Procedure: PERIPHERAL VASCULAR BALLOON ANGIOPLASTY;  Surgeon: Waynetta Sandy, MD;  Location: [facility];  Service: Cardiovascular;  Laterality: Left;  external iliac   POLYPECTOMY N/A 02/15/2016   Procedure: POLYPECTOMY;  Surgeon: Lucilla Lame, MD;  Location: [facility];  Service: Endoscopy;  Laterality: N/A;    Family History  Problem Relation Age of Onset   COPD Mother    Hypertension Father    Heart attack Brother     Social History   Tobacco Use   Smoking status: Former    Packs/day: 0.50    Years: 40.00    Total  pack years: 20.00    Types: Cigarettes    Start date: 07/21/1978    Quit date: 12/2018    Years since quitting: 3.3   Smokeless tobacco: Never   Tobacco comments:    Started using cocaine recently but states he will try to quit   Substance Use Topics   Alcohol use: Not Currently    Alcohol/week: 12.0 standard drinks of alcohol    Types: 12 Cans of beer per week    Comment: 4 beers / week now per pt 05/05/20     Current Outpatient Medications:    albuterol (VENTOLIN HFA) 108 (90 Base) MCG/ACT inhaler, Inhale 2 puffs into the lungs every 6 (six) hours as needed for wheezing or shortness of breath., Disp: 18 g, Rfl: 0   amLODipine-valsartan (EXFORGE) 5-160 MG tablet, Take 1 tablet by mouth daily., Disp: 90 tablet, Rfl: 1   ascorbic acid (VITAMIN C) 500 MG tablet, Take 1 tablet (500 mg total) by mouth daily., Disp: 30 tablet, Rfl: 0   aspirin EC 81 MG tablet, Take 1 tablet (81 mg total) by mouth daily., Disp: 90 tablet, Rfl: 3   atorvastatin (LIPITOR) 40 MG tablet, Take 1 tablet (40 mg total) by mouth daily., Disp: 90 tablet, Rfl: 1   benzonatate (TESSALON) 100 MG capsule, Take 1 capsule (100 mg total) by mouth every 8 (eight) hours as needed for cough., Disp: 21 capsule, Rfl: 0   Cholecalciferol (DIALYVITE VITAMIN D 5000) 125 MCG (5000 UT) capsule, Take 5,000 Units by mouth daily., Disp: , Rfl:    clopidogrel (PLAVIX) 75 MG tablet, Take 1 tablet (75 mg total) by mouth daily., Disp: 90 tablet, Rfl: 1   Cyanocobalamin (B-12) 500 MCG SUBL, Place 1 tablet under the tongue daily., Disp: 30 tablet, Rfl: 0   DULoxetine (CYMBALTA) 60 MG capsule, Take 1 capsule (60 mg total) by mouth daily., Disp: 90 capsule, Rfl: 1   Fluticasone-Umeclidin-Vilant (TRELEGY ELLIPTA) 100-62.5-25 MCG/ACT AEPB, Inhale 1 puff into the lungs daily., Disp: 3 each, Rfl: 1   gabapentin (NEURONTIN) 100 MG capsule, Take 1 capsule (100 mg total) by mouth 3 (three) times daily., Disp: 270 capsule, Rfl: 1   Multiple Vitamin  (MULTIVITAMIN WITH MINERALS) TABS tablet, Take 1 tablet by mouth daily., Disp: , Rfl:    pantoprazole (PROTONIX) 20 MG tablet, Take 1 tablet (20 mg total) by mouth daily., Disp: 90 tablet, Rfl: 1   tadalafil (CIALIS) 20 MG tablet, Take 0.5-1 tablets (10-20 mg total) by mouth every other day as needed for erectile dysfunction., Disp: 10 tablet, Rfl: 2   traZODone (DESYREL) 100 MG tablet, TAKE 1 TABLET BY MOUTH AT BEDTIME, Disp: 90 tablet, Rfl: 0  No Known Allergies  I personally reviewed active problem list, medication list, allergies, family  history, social history, health maintenance with the patient/caregiver today.   ROS  ***  Objective  There were no vitals filed for this visit.  There is no height or weight on file to calculate BMI.  Physical Exam ***  No results found for this or any previous visit (from the past 2160 hour(s)).   PHQ2/9:    01/24/2022    9:05 AM 10/22/2021   10:28 AM 08/15/2021    9:58 AM 05/21/2021   10:03 AM 01/17/2021    9:51 AM  Depression screen PHQ 2/9  Decreased Interest 0 0 0 0 0  Down, Depressed, Hopeless 0 0 0 0 0  PHQ - 2 Score 0 0 0 0 0  Altered sleeping 0 0 0 0   Tired, decreased energy 0 0 0 0   Change in appetite 0 0 0 0   Feeling bad or failure about yourself  0 0 0 0   Trouble concentrating 0 0 0 0   Moving slowly or fidgety/restless 0 0 0 0   Suicidal thoughts 0 0 0 0   PHQ-9 Score 0 0 0 0     phq 9 is {gen pos [id number]}   Fall Risk:    01/24/2022    9:04 AM 10/22/2021   10:28 AM 08/15/2021    9:58 AM 05/21/2021   10:03 AM 01/17/2021    9:51 AM  Fall Risk   Falls in the past year? 0 0 0 0 0  Number falls in past yr: 0 0  0 0  Injury with Fall? 0 0  0 0  Risk for fall due to : No Fall Risks No Fall Risks  No Fall Risks   Follow up Falls prevention discussed Falls prevention discussed Falls prevention discussed Falls prevention discussed       Functional Status Survey:      Assessment & Plan  *** There are no  diagnoses linked to this encounter.

## 2022-05-13 NOTE — Progress Notes (Unsigned)
Name: Ryan [name]   MRN: [id number]    DOB: Sep 10, 1955   Date:05/14/2022       Progress Note  Subjective  Chief Complaint  Follow Up  I connected with  Ryan [name]  on 05/14/22 at  3:40 PM EST by a a video - telemedicine application and verified that I am speaking with the correct person using two identifiers.  I discussed the limitations of evaluation and management by telemedicine and the availability of in person appointments. The patient expressed understanding and agreed to proceed with the virtual visit  Staff also discussed with the patient that there may be a patient responsible charge related to this service. Patient Location: at home  Provider Location: [facility] Additional Individuals present: daughter   HPI  Phantom pains: still experiencing phantom pains but states they are "normal". It is aggravated by walking ( uses prosthesis) for a prolonged period of time . Stable symptoms   Malnutrition: he had lost almost 10 % of his body weight in the past 5 months, at the time he told me that the only change was that he was  doing cocaine a few times a week for the previous two months, he states he quit using cocaine since the Spring . He states he thinks he has gained weight since   Dysthymia: States he is doing "good", taking Duloxetine and denies side effects . Denies sadness or depressed mood today Feeling well  Insomnia: taking Trazodone 100 mg every night and is able to fall asleep around midnight and wakes up around 6 am. He needs a refill to be sent to [facility] now, living with his daughter  HTN: He is on ARB, his last GFR was up from 31 to 64 . He denies chest pain or palpitation We will recheck labs when he comes for in person visit   Emphysema: he is taking Trelegy, states wheezing at night has decreased, still has some SOB when walking but stable. He has cold symptoms, went to urgent care and covid test is pending, he has a mild cough   Atherosclerosis of Ectasy  ascending aorta: reviewed last CT and no aneurysm but  ascending aorta was 3.8 cm . He sees Dr. [name] for his PVD, but patient is not sure if Dr. [name] knows about the aneurysm. He takes atorvastatin 40 mg daily , he states he is compliant , explained goal LDL is below 70 , he wants to wait to adjust the dose after we recheck level   History of Alcoholism: he is off B12 because level was high, also off B1. He no longer drinks , since he had leg amputated back in 2021   ED: he took viagra in the past, we sent Cialis to be used with [facility] but unable to drive there to pick it up   Patient Active Problem List   Diagnosis Date Noted   History of alcoholism ([place]) 01/24/2022   History of amputation below knee, right ([place]) 01/24/2022   Mild protein-calorie malnutrition ([place]) 01/24/2022   Benign hypertension with chronic kidney disease, stage III ([place]) 01/24/2022   Aneurysm of ascending aorta without rupture ([place]) 01/24/2022   Stage 3a chronic kidney disease ([place]) 01/24/2022   Phantom limb pain ([place])    Below-knee amputation of right lower extremity ([place]) 05/17/2020   Iron deficiency anemia 11/23/2018   Stage 3 chronic kidney disease ([place]) 10/21/2018   Atherosclerosis of native arteries of extremity with rest pain ([place]) 10/13/2018   PAD (  peripheral artery disease) ([place]) 07/21/2018   Claudication ([place]) 03/17/2018   B12 deficiency 03/12/2018   Vitamin B1 deficiency 03/12/2018   Varicose veins of leg with pain, right 03/12/2018   Enlarged thoracic aorta ([place]) 02/24/2018   Alcoholism /alcohol abuse 02/24/2018   Paresthesia 02/24/2018   Ectatic aorta ([place]) 02/20/2018   Emphysema lung ([place]) 02/20/2018   Atherosclerosis of aorta ([place]) 02/20/2018   Hypertension, benign 08/31/2015   Tobacco use 08/31/2015    Past Surgical History:  Procedure Laterality Date   ABDOMINAL AORTOGRAM W/LOWER EXTREMITY N/A 09/13/2019   Procedure: ABDOMINAL AORTOGRAM W/LOWER EXTREMITY;  Surgeon: Waynetta Sandy,  MD;  Location: [place] [facility];  Service: Cardiovascular;  Laterality: N/A;   AMPUTATION Right 05/09/2020   Procedure: RIGHT BELOW KNEE AMPUTATION;  Surgeon: Waynetta Sandy, MD;  Location: [place];  Service: Vascular;  Laterality: Right;  w/ a block   COLONOSCOPY WITH PROPOFOL N/A 02/15/2016   Procedure: COLONOSCOPY WITH PROPOFOL;  Surgeon: Lucilla Lame, MD;  Location: [place];  Service: Endoscopy;  Laterality: N/A;   FEMORAL-POPLITEAL BYPASS GRAFT Right 09/14/2019   Procedure: BYPASS GRAFT FEMORAL-POPLITEAL ARTERY;  Surgeon: Waynetta Sandy, MD;  Location: [place];  Service: Vascular;  Laterality: Right;   HERNIA REPAIR  1999   left inguinal   INSERTION OF ILIAC STENT Right 09/14/2019   Procedure: Insertion Of Common and External Iliac Stent;  Surgeon: Waynetta Sandy, MD;  Location: [place];  Service: Vascular;  Laterality: Right;   LOWER EXTREMITY ANGIOGRAPHY Right 05/07/2018   Procedure: LOWER EXTREMITY ANGIOGRAPHY;  Surgeon: Algernon Huxley, MD;  Location: [place] [facility];  Service: Cardiovascular;  Laterality: Right;   LOWER EXTREMITY ANGIOGRAPHY Right 06/25/2018   Procedure: LOWER EXTREMITY ANGIOGRAPHY;  Surgeon: Algernon Huxley, MD;  Location: [place] [facility];  Service: Cardiovascular;  Laterality: Right;   LOWER EXTREMITY ANGIOGRAPHY Right 07/01/2018   Procedure: LOWER EXTREMITY ANGIOGRAPHY;  Surgeon: Algernon Huxley, MD;  Location: [place] [facility];  Service: Cardiovascular;  Laterality: Right;   LOWER EXTREMITY ANGIOGRAPHY Right 08/12/2018   Procedure: LOWER EXTREMITY ANGIOGRAPHY;  Surgeon: Algernon Huxley, MD;  Location: [place] [facility];  Service: Cardiovascular;  Laterality: Right;   LOWER EXTREMITY ANGIOGRAPHY Right 09/03/2018   Procedure: LOWER EXTREMITY ANGIOGRAPHY;  Surgeon: Algernon Huxley, MD;  Location: [place] [facility];  Service: Cardiovascular;  Laterality: Right;   LOWER EXTREMITY ANGIOGRAPHY Right 09/04/2018   Procedure: Lower  Extremity Angiography;  Surgeon: Algernon Huxley, MD;  Location: [place] [facility];  Service: Cardiovascular;  Laterality: Right;   LOWER EXTREMITY ANGIOGRAPHY Right 10/01/2018   Procedure: LOWER EXTREMITY ANGIOGRAPHY;  Surgeon: Algernon Huxley, MD;  Location: [place] [facility];  Service: Cardiovascular;  Laterality: Right;   LOWER EXTREMITY ANGIOGRAPHY Right 10/01/2018   Procedure: Lower Extremity Angiography;  Surgeon: Algernon Huxley, MD;  Location: [place] [facility];  Service: Cardiovascular;  Laterality: Right;   LOWER EXTREMITY ANGIOGRAPHY Right 10/15/2018   Procedure: LOWER EXTREMITY ANGIOGRAPHY;  Surgeon: Algernon Huxley, MD;  Location: [place] [facility];  Service: Cardiovascular;  Laterality: Right;   LOWER EXTREMITY ANGIOGRAPHY Left 10/16/2018   Procedure: Lower Extremity Angiography;  Surgeon: Algernon Huxley, MD;  Location: [place] [facility];  Service: Cardiovascular;  Laterality: Left;   LOWER EXTREMITY ANGIOGRAPHY Left 01/20/2019   Procedure: LOWER EXTREMITY ANGIOGRAPHY;  Surgeon: Algernon Huxley, MD;  Location: [place] [facility];  Service: Cardiovascular;  Laterality: Left;  LOWER EXTREMITY ANGIOGRAPHY Right 01/21/2019   Procedure: Lower Extremity Angiography;  Surgeon: Algernon Huxley, MD;  Location: [place] [facility];  Service: Cardiovascular;  Laterality: Right;   LOWER EXTREMITY ANGIOGRAPHY Right 01/28/2019   Procedure: LOWER EXTREMITY ANGIOGRAPHY;  Surgeon: Algernon Huxley, MD;  Location: [place] [facility];  Service: Cardiovascular;  Laterality: Right;   LOWER EXTREMITY ANGIOGRAPHY Right 01/29/2019   Procedure: Lower Extremity Angiography;  Surgeon: Algernon Huxley, MD;  Location: [place] [facility];  Service: Cardiovascular;  Laterality: Right;   LOWER EXTREMITY ANGIOGRAPHY Right 04/04/2020   Procedure: LOWER EXTREMITY ANGIOGRAPHY;  Surgeon: Waynetta Sandy, MD;  Location: [place] [facility];  Service: Cardiovascular;  Laterality: Right;   LOWER EXTREMITY INTERVENTION Right  07/02/2018   Procedure: LOWER EXTREMITY INTERVENTION;  Surgeon: Algernon Huxley, MD;  Location: [place] [facility];  Service: Cardiovascular;  Laterality: Right;   PERIPHERAL VASCULAR BALLOON ANGIOPLASTY Left 04/04/2020   Procedure: PERIPHERAL VASCULAR BALLOON ANGIOPLASTY;  Surgeon: Waynetta Sandy, MD;  Location: [place] [facility];  Service: Cardiovascular;  Laterality: Left;  external iliac   POLYPECTOMY N/A 02/15/2016   Procedure: POLYPECTOMY;  Surgeon: Lucilla Lame, MD;  Location: [place];  Service: Endoscopy;  Laterality: N/A;    Family History  Problem Relation Age of Onset   COPD Mother    Hypertension Father    Heart attack Brother     Social History   Socioeconomic History   Marital status: Single    Spouse name: [name]   Number of children: 5   Years of education: Not on file   Highest education level: High school graduate  Occupational History   Occupation: Forklift    Comment: [name]  Tobacco Use   Smoking status: Former    Packs/day: 0.50    Years: 40.00    Total pack years: 20.00    Types: Cigarettes    Start date: 07/21/1978    Quit date: 12/2018    Years since quitting: 3.3   Smokeless tobacco: Never   Tobacco comments:    Started using cocaine recently but states he will try to quit   Vaping Use   Vaping Use: Never used  Substance and Sexual Activity   Alcohol use: Not Currently    Alcohol/week: 12.0 standard drinks of alcohol    Types: 12 Cans of beer per week    Comment: 4 beers / week now per pt 05/05/20   Drug use: Yes    Frequency: 2.0 times per week    Types: Cocaine   Sexual activity: Yes    Partners: Female    Birth control/protection: None  Other Topics Concern   Not on file  Social History Narrative   Not on file   Social Determinants of Health   Financial Resource Strain: Low Risk  (03/12/2018)   Overall Financial Resource Strain (CARDIA)    Difficulty of Paying Living Expenses: Not hard at all  Food  Insecurity: No Food Insecurity (03/12/2018)   Hunger Vital Sign    Worried About Running Out of Food in the Last Year: Never true    Ran Out of Food in the Last Year: Never true  Transportation Needs: No Transportation Needs (03/12/2018)   PRAPARE - Hydrologist (Medical): No    Lack of Transportation (Non-Medical): No  Physical Activity: Sufficiently Active (06/08/2018)   Exercise Vital Sign    Days of Exercise per Week: 6 days  Minutes of Exercise per Session: 60 min  Recent Concern: Physical Activity - Inactive (03/12/2018)   Exercise Vital Sign    Days of Exercise per Week: 0 days    Minutes of Exercise per Session: 0 min  Stress: No Stress Concern Present (03/12/2018)   [place]    Feeling of Stress : Not at all  Social Connections: Somewhat Isolated (03/12/2018)   Social Connection and Isolation Panel [NHANES]    Frequency of Communication with Friends and Family: Three times a week    Frequency of Social Gatherings with Friends and Family: Twice a week    Attends Religious Services: Never    Marine scientist or Organizations: No    Attends Archivist Meetings: Never    Marital Status: Married  Human resources officer Violence: Not At Risk (03/12/2018)   Humiliation, Afraid, Rape, and Kick questionnaire    Fear of Current or Ex-Partner: No    Emotionally Abused: No    Physically Abused: No    Sexually Abused: No     Current Outpatient Medications:    albuterol (VENTOLIN HFA) 108 (90 Base) MCG/ACT inhaler, Inhale 2 puffs into the lungs every 6 (six) hours as needed for wheezing or shortness of breath., Disp: 18 g, Rfl: 0   amLODipine-valsartan (EXFORGE) 5-160 MG tablet, Take 1 tablet by mouth daily., Disp: 90 tablet, Rfl: 1   ascorbic acid (VITAMIN C) 500 MG tablet, Take 1 tablet (500 mg total) by mouth daily., Disp: 30 tablet, Rfl: 0   aspirin EC 81 MG tablet, Take 1  tablet (81 mg total) by mouth daily., Disp: 90 tablet, Rfl: 3   atorvastatin (LIPITOR) 40 MG tablet, Take 1 tablet (40 mg total) by mouth daily., Disp: 90 tablet, Rfl: 1   Cholecalciferol (DIALYVITE VITAMIN D 5000) 125 MCG (5000 UT) capsule, Take 5,000 Units by mouth daily., Disp: , Rfl:    clopidogrel (PLAVIX) 75 MG tablet, Take 1 tablet (75 mg total) by mouth daily., Disp: 90 tablet, Rfl: 1   Cyanocobalamin (B-12) 500 MCG SUBL, Place 1 tablet under the tongue daily., Disp: 30 tablet, Rfl: 0   DULoxetine (CYMBALTA) 60 MG capsule, Take 1 capsule (60 mg total) by mouth daily., Disp: 90 capsule, Rfl: 1   Fluticasone-Umeclidin-Vilant (TRELEGY ELLIPTA) 100-62.5-25 MCG/ACT AEPB, Inhale 1 puff into the lungs daily., Disp: 3 each, Rfl: 1   gabapentin (NEURONTIN) 100 MG capsule, Take 1 capsule (100 mg total) by mouth 3 (three) times daily., Disp: 270 capsule, Rfl: 1   Multiple Vitamin (MULTIVITAMIN WITH MINERALS) TABS tablet, Take 1 tablet by mouth daily., Disp: , Rfl:    pantoprazole (PROTONIX) 20 MG tablet, Take 1 tablet (20 mg total) by mouth daily., Disp: 90 tablet, Rfl: 1   tadalafil (CIALIS) 20 MG tablet, Take 0.5-1 tablets (10-20 mg total) by mouth every other day as needed for erectile dysfunction., Disp: 10 tablet, Rfl: 2   traZODone (DESYREL) 100 MG tablet, TAKE 1 TABLET BY MOUTH AT BEDTIME, Disp: 90 tablet, Rfl: 0   benzonatate (TESSALON) 100 MG capsule, Take 1 capsule (100 mg total) by mouth every 8 (eight) hours as needed for cough. (Patient not taking: Reported on 05/14/2022), Disp: 21 capsule, Rfl: 0  No Known Allergies  I personally reviewed active problem list, medication list, allergies, family history, social history, health maintenance with the patient/caregiver today.   ROS  Ten systems reviewed and is negative except as mentioned in HPI   Objective  Virtual encounter, vitals not obtained.  There is no height or weight on file to calculate BMI.  Physical Exam  Awake, alert  and oriented  PHQ2/9:    05/14/2022    2:57 PM 01/24/2022    9:05 AM 10/22/2021   10:28 AM 08/15/2021    9:58 AM 05/21/2021   10:03 AM  Depression screen PHQ 2/9  Decreased Interest 0 0 0 0 0  Down, Depressed, Hopeless 0 0 0 0 0  PHQ - 2 Score 0 0 0 0 0  Altered sleeping 0 0 0 0 0  Tired, decreased energy 0 0 0 0 0  Change in appetite 0 0 0 0 0  Feeling bad or failure about yourself  0 0 0 0 0  Trouble concentrating 0 0 0 0 0  Moving slowly or fidgety/restless 0 0 0 0 0  Suicidal thoughts 0 0 0 0 0  PHQ-9 Score 0 0 0 0 0   PHQ-2/9 Result is negative.    Fall Risk:    05/14/2022    2:56 PM 01/24/2022    9:04 AM 10/22/2021   10:28 AM 08/15/2021    9:58 AM 05/21/2021   10:03 AM  Fall Risk   Falls in the past year? 0 0 0 0 0  Number falls in past yr: 0 0 0  0  Injury with Fall? 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  Dispense: 10 tablet; Refill: 2  2. History of alcoholism ([place])  - pantoprazole (PROTONIX) 20 MG tablet; Take 1 tablet (20 mg total) by mouth daily.  Dispense: 90 tablet; Refill: 1  3. Other insomnia  - traZODone (DESYREL) 100 MG tablet; Take 1 tablet (100 mg total) by mouth at bedtime.  Dispense: 90 tablet; Refill: 1  4. Phantom pain after amputation of lower extremity (HCC)  - gabapentin (NEURONTIN) 100 MG capsule; Take 1 capsule (100 mg total) by mouth 3 (three) times daily.  Dispense: 270 capsule; Refill: 1 - DULoxetine (CYMBALTA) 60 MG capsule; Take 1 capsule (60 mg total) by mouth daily.  Dispense: 90 capsule; Refill: 1  5. Pulmonary emphysema, unspecified  emphysema type ([place])  - Fluticasone-Umeclidin-Vilant (TRELEGY ELLIPTA) 100-62.5-25 MCG/ACT AEPB; Inhale 1 puff into the lungs daily.  Dispense: 3 each; Refill: 1  6. PAD (peripheral artery disease) (HCC)  - clopidogrel (PLAVIX) 75 MG tablet; Take 1 tablet (75 mg total) by mouth daily.  Dispense: 90 tablet; Refill: 1 - atorvastatin (LIPITOR) 40 MG tablet; Take 1 tablet (40 mg total) by mouth daily.  Dispense: 90 tablet; Refill: 1  7. Atherosclerosis of aorta (HCC)  - atorvastatin (LIPITOR) 40 MG tablet; Take 1 tablet (40 mg total) by mouth daily.  Dispense: 90 tablet; Refill: 1  8. Hypertension, benign  - amLODipine-valsartan (EXFORGE) 5-160 MG tablet; Take 1 tablet by mouth daily.  Dispense: 90 tablet; Refill: 1  9. Benign hypertension with chronic kidney disease, stage III (HCC)  - amLODipine-valsartan (EXFORGE) 5-160 MG tablet; Take 1 tablet by mouth daily.  Dispense: 90 tablet; Refill: 1   I discussed the  assessment and treatment plan with the patient. The patient was provided an opportunity to ask questions and all were answered. The patient agreed with the plan and demonstrated an understanding of the instructions.  The patient was advised to call back or seek an in-person evaluation if the symptoms worsen or if the condition fails to improve as anticipated.  I provided 25 minutes of non-face-to-face time during this encounter.

## 2022-05-14 ENCOUNTER — Ambulatory Visit: Payer: 59 | Admitting: Family Medicine

## 2022-05-14 ENCOUNTER — Telehealth: Payer: Self-pay | Admitting: Family Medicine

## 2022-05-14 ENCOUNTER — Encounter: Payer: Self-pay | Admitting: Family Medicine

## 2022-05-14 ENCOUNTER — Telehealth (INDEPENDENT_AMBULATORY_CARE_PROVIDER_SITE_OTHER): Payer: 59 | Admitting: Family Medicine

## 2022-05-14 DIAGNOSIS — J439 Emphysema, unspecified: Secondary | ICD-10-CM

## 2022-05-14 DIAGNOSIS — N183 Chronic kidney disease, stage 3 unspecified: Secondary | ICD-10-CM

## 2022-05-14 DIAGNOSIS — I10 Essential (primary) hypertension: Secondary | ICD-10-CM

## 2022-05-14 DIAGNOSIS — F1021 Alcohol dependence, in remission: Secondary | ICD-10-CM

## 2022-05-14 DIAGNOSIS — N529 Male erectile dysfunction, unspecified: Secondary | ICD-10-CM

## 2022-05-14 DIAGNOSIS — I739 Peripheral vascular disease, unspecified: Secondary | ICD-10-CM

## 2022-05-14 DIAGNOSIS — G4709 Other insomnia: Secondary | ICD-10-CM

## 2022-05-14 DIAGNOSIS — I700 Atherosclerosis of aorta: Secondary | ICD-10-CM

## 2022-05-14 DIAGNOSIS — G546 Phantom limb syndrome with pain: Secondary | ICD-10-CM

## 2022-05-14 DIAGNOSIS — I129 Hypertensive chronic kidney disease with stage 1 through stage 4 chronic kidney disease, or unspecified chronic kidney disease: Secondary | ICD-10-CM

## 2022-05-14 MED ORDER — GABAPENTIN 100 MG PO CAPS: 100.0000 mg | ORAL_CAPSULE | Freq: Three times a day (TID) | ORAL | 1 refills | Status: DC

## 2022-05-14 MED ORDER — TADALAFIL 20 MG PO TABS: 10.0000 mg | ORAL_TABLET | ORAL | 2 refills | Status: DC | PRN

## 2022-05-14 MED ORDER — TRELEGY ELLIPTA 100-62.5-25 MCG/ACT IN AEPB: 1.0000 | INHALATION_SPRAY | Freq: Every day | RESPIRATORY_TRACT | 1 refills | Status: DC

## 2022-05-14 MED ORDER — CLOPIDOGREL BISULFATE 75 MG PO TABS: 75.0000 mg | ORAL_TABLET | Freq: Every day | ORAL | 1 refills | Status: DC

## 2022-05-14 MED ORDER — AMLODIPINE BESYLATE-VALSARTAN 5-160 MG PO TABS: 1.0000 | ORAL_TABLET | Freq: Every day | ORAL | 1 refills | Status: DC

## 2022-05-14 MED ORDER — ATORVASTATIN CALCIUM 40 MG PO TABS: 40.0000 mg | ORAL_TABLET | Freq: Every day | ORAL | 1 refills | Status: DC

## 2022-05-14 MED ORDER — DULOXETINE HCL 60 MG PO CPEP: 60.0000 mg | ORAL_CAPSULE | Freq: Every day | ORAL | 1 refills | Status: DC

## 2022-05-14 MED ORDER — PANTOPRAZOLE SODIUM 20 MG PO TBEC: 20.0000 mg | DELAYED_RELEASE_TABLET | Freq: Every day | ORAL | 1 refills | Status: DC

## 2022-05-14 MED ORDER — TRAZODONE HCL 100 MG PO TABS: 100.0000 mg | ORAL_TABLET | Freq: Every day | ORAL | 1 refills | Status: DC

## 2022-05-15 NOTE — Telephone Encounter (Signed)
Requested Prescriptions  Pending Prescriptions Disp Refills   [place] 100-62.5-25 MCG/ACT AEPB [Pharmacy Med Name: TRELEGY ELLIPTA 100-62.5-25 AE] 180 each 1    Sig: INHALE 1 PUFF INTO THE LUNGS DAILY.     Off-Protocol Failed - 05/14/2022  5:51 PM      Failed - Medication not assigned to a protocol, review manually.      Passed - Valid encounter within last 12 months    Recent Outpatient Visits           Yesterday ED (erectile dysfunction) of organic origin   [facility] Steele Sizer, MD   3 months ago Stage 3a chronic kidney disease [facility])   [facility] Steele Sizer, MD   6 months ago PAD (peripheral artery disease) [facility])   [facility] Steele Sizer, MD   9 months ago COPD with acute exacerbation [facility])   [facility] Steele Sizer, MD   11 months ago Need for immunization against influenza   [facility] Steele Sizer, MD       Future Appointments             In 2 months Ancil Boozer, Drue Stager, MD [facility], [facility]

## 2022-05-17 LAB — SARS CORONAVIRUS 2 (TAT 6-24 HRS): SARS Coronavirus 2: POSITIVE — AB

## 2022-05-28 ENCOUNTER — Ambulatory Visit: Payer: Self-pay | Admitting: *Deleted

## 2022-05-28 ENCOUNTER — Telehealth: Payer: Self-pay

## 2022-05-28 DIAGNOSIS — G546 Phantom limb syndrome with pain: Secondary | ICD-10-CM

## 2022-05-28 DIAGNOSIS — N183 Chronic kidney disease, stage 3 unspecified: Secondary | ICD-10-CM

## 2022-05-28 DIAGNOSIS — I739 Peripheral vascular disease, unspecified: Secondary | ICD-10-CM

## 2022-05-28 DIAGNOSIS — F1021 Alcohol dependence, in remission: Secondary | ICD-10-CM

## 2022-05-28 DIAGNOSIS — I10 Essential (primary) hypertension: Secondary | ICD-10-CM

## 2022-05-28 DIAGNOSIS — J439 Emphysema, unspecified: Secondary | ICD-10-CM

## 2022-05-28 NOTE — Telephone Encounter (Signed)
No contact with caller. Upon transfer from agent call dropped or disconnected. Attempted to call [phone] given from agent. [employer] answered did not leave message or speak to representative. Agent reports caller attempting to contact PCP to clarify medications.

## 2022-05-29 ENCOUNTER — Other Ambulatory Visit: Payer: Self-pay | Admitting: *Deleted

## 2022-05-29 ENCOUNTER — Ambulatory Visit: Payer: Self-pay | Admitting: *Deleted

## 2022-05-29 DIAGNOSIS — J439 Emphysema, unspecified: Secondary | ICD-10-CM

## 2022-05-29 DIAGNOSIS — G546 Phantom limb syndrome with pain: Secondary | ICD-10-CM

## 2022-05-29 NOTE — Telephone Encounter (Signed)
Requesting medication refills. Almost out of medications. Requesting if appt can be scheduled to get flu/ pneumonia and shingles vaccination. In review of chart scheduled for 08/09/22.

## 2022-05-29 NOTE — Telephone Encounter (Signed)
Requested medication (s) are due for refill today: requesting different pharmacy  Requested medication (s) are on the active medication list: yes   Last refill:  trelegy- 05/14/22 #3 each 1 refill, cymbalta- 05/14/22 #90 1 refills  Future visit scheduled: yes in 2 months   Notes to clinic:  medication not assigned to a protocol. Patient called requesting refills. Reviewed different pharmacy . Please advise      Requested Prescriptions  Pending Prescriptions Disp Refills   Fluticasone-Umeclidin-Vilant (TRELEGY ELLIPTA) 100-62.5-25 MCG/ACT AEPB 3 each 1    Sig: Inhale 1 puff into the lungs daily.     Off-Protocol Failed - 05/29/2022  4:40 PM      Failed - Medication not assigned to a protocol, review manually.      Passed - Valid encounter within last 12 months    Recent Outpatient Visits           2 weeks ago ED (erectile dysfunction) of organic origin   [facility] Steele Sizer, MD   4 months ago Stage 3a chronic kidney disease [facility])   [facility] Steele Sizer, MD   7 months ago PAD (peripheral artery disease) [facility])   [facility] Steele Sizer, MD   9 months ago COPD with acute exacerbation [facility])   [facility] Steele Sizer, MD   1 year ago Need for immunization against influenza   [facility] Steele Sizer, MD       Future Appointments             In 2 months Steele Sizer, MD [facility], [facility]             DULoxetine (CYMBALTA) 60 MG capsule 90 capsule 1    Sig: Take 1 capsule (60 mg total) by mouth daily.     Psychiatry: Antidepressants - SNRI - duloxetine Failed - 05/29/2022  4:40 PM      Failed - Cr in normal range and within 360 days    Creat  Date Value Ref Range Status  11/02/2021 1.94 (H) 0.70 - 1.35 mg/dL Final   Creatinine, Urine  Date Value Ref Range Status  10/22/2021 134 20 - 320 mg/dL Final         Failed - Last  BP in normal range    BP Readings from Last 1 Encounters:  05/12/22 (!) 145/82         Passed - eGFR is 30 or above and within 360 days    GFR, Est African American  Date Value Ref Range Status  03/14/2020 57 (L) > OR = 60 mL/min/1.19m Final   GFR, Est Non African American  Date Value Ref Range Status  03/14/2020 49 (L) > OR = 60 mL/min/1.747mFinal   GFR, Estimated  Date Value Ref Range Status  06/08/2021 36 (L) >60 mL/min Final    Comment:    (NOTE) Calculated using the CKD-EPI Creatinine Equation (2021)    eGFR  Date Value Ref Range Status  11/02/2021 38 (L) > OR = 60 mL/min/1.7324minal    Comment:    The eGFR is based on the CKD-EPI 2021 equation. To calculate  the new eGFR from a previous Creatinine or Cystatin C result, go to https://www.kidney.org/professionals/ kdoqi/gfr%5Fcalculator          Passed - Completed PHQ-2 or PHQ-9 in the last 360 days      Passed - Valid encounter within last 6 months  Recent Outpatient Visits           2 weeks ago ED (erectile dysfunction) of organic origin   [facility] Steele Sizer, MD   4 months ago Stage 3a chronic kidney disease [facility])   [facility] Steele Sizer, MD   7 months ago PAD (peripheral artery disease) [facility])   [facility] Steele Sizer, MD   9 months ago COPD with acute exacerbation [facility])   [facility] Steele Sizer, MD   1 year ago Need for immunization against influenza   [facility] Steele Sizer, MD       Future Appointments             In 2 months Ancil Boozer, Drue Stager, MD [facility], [facility]

## 2022-05-30 ENCOUNTER — Telehealth: Payer: Self-pay | Admitting: Family Medicine

## 2022-05-30 ENCOUNTER — Other Ambulatory Visit: Payer: Self-pay

## 2022-05-30 NOTE — Telephone Encounter (Signed)
Copied from [place] [id number]. Topic: General - Other >> May 30, 2022  2:18 PM [place] B wrote: Reason for CRM: Patient returning call back to Sandra,not sure what its about. Please call back

## 2022-05-30 NOTE — Telephone Encounter (Signed)
Tried calling patient, no answer w/no vm. He should not be out of his medications. Trelegy- 05/14/22 #3 w/1 refill, and Cymbalta- 05/14/22 #90 w/1 refill. Patient can come in anytime Mon-Fri 8am-11:30am and 1pm-3:30pm for a nurse visit for immunizations only.

## 2022-05-30 NOTE — Telephone Encounter (Signed)
Spoke with pharmacy. They confirmed they received the rx on 05/14/22 and that patient picked it up 05/15/22.

## 2022-06-26 ENCOUNTER — Other Ambulatory Visit: Payer: Self-pay

## 2022-06-26 DIAGNOSIS — I739 Peripheral vascular disease, unspecified: Secondary | ICD-10-CM

## 2022-06-26 DIAGNOSIS — I129 Hypertensive chronic kidney disease with stage 1 through stage 4 chronic kidney disease, or unspecified chronic kidney disease: Secondary | ICD-10-CM

## 2022-06-26 DIAGNOSIS — J439 Emphysema, unspecified: Secondary | ICD-10-CM

## 2022-06-26 DIAGNOSIS — I700 Atherosclerosis of aorta: Secondary | ICD-10-CM

## 2022-06-26 DIAGNOSIS — G546 Phantom limb syndrome with pain: Secondary | ICD-10-CM

## 2022-06-26 DIAGNOSIS — F1021 Alcohol dependence, in remission: Secondary | ICD-10-CM

## 2022-06-26 DIAGNOSIS — I10 Essential (primary) hypertension: Secondary | ICD-10-CM

## 2022-08-08 NOTE — Progress Notes (Deleted)
Name: Ryan [name]   MRN: [id number]    DOB: June 21, 1956   Date:08/08/2022       Progress Note  Subjective  Chief Complaint  Follow Up  HPI  Phantom pains: still experiencing phantom pains but states they are "normal". It is aggravated by walking ( uses prosthesis) for a prolonged period of time . Stable symptoms   Malnutrition: he had lost almost 10 % of his body weight in the past 5 months, at the time he told me that the only change was that he was  doing cocaine a few times a week for the previous two months, he states he quit using cocaine since the Spring . He states he thinks he has gained weight since   Dysthymia: States he is doing "good", taking Duloxetine and denies side effects . Denies sadness or depressed mood today Feeling well  Insomnia: taking Trazodone 100 mg every night and is able to fall asleep around midnight and wakes up around 6 am. He needs a refill to be sent to [facility] now, living with his daughter  HTN: He is on ARB, his last GFR was up from 31 to 40 . He denies chest pain or palpitation We will recheck labs when he comes for in person visit   Emphysema: he is taking Trelegy, states wheezing at night has decreased, still has some SOB when walking but stable. He has cold symptoms, went to urgent care and covid test is pending, he has a mild cough   Atherosclerosis of Ectasy ascending aorta: reviewed last CT and no aneurysm but  ascending aorta was 3.8 cm . He sees Dr. [name] for his PVD, but patient is not sure if Dr. [name] knows about the aneurysm. He takes atorvastatin 40 mg daily , he states he is compliant , explained goal LDL is below 70 , he wants to wait to adjust the dose after we recheck level   History of Alcoholism: he is off B12 because level was high, also off B1. He no longer drinks , since he had leg amputated back in 2021   ED: he took viagra in the past, we sent Cialis to be used with [facility] but unable to drive there to pick it up   Patient  Active Problem List   Diagnosis Date Noted   History of alcoholism ([place]) 01/24/2022   History of amputation below knee, right ([place]) 01/24/2022   Mild protein-calorie malnutrition ([place]) 01/24/2022   Benign hypertension with chronic kidney disease, stage III ([place]) 01/24/2022   Aneurysm of ascending aorta without rupture ([place]) 01/24/2022   Stage 3a chronic kidney disease ([place]) 01/24/2022   Phantom limb pain ([place])    Below-knee amputation of right lower extremity ([place]) 05/17/2020   Iron deficiency anemia 11/23/2018   Stage 3 chronic kidney disease ([place]) 10/21/2018   Atherosclerosis of native arteries of extremity with rest pain ([place]) 10/13/2018   PAD (peripheral artery disease) ([place]) 07/21/2018   Claudication ([place]) 03/17/2018   B12 deficiency 03/12/2018   Vitamin B1 deficiency 03/12/2018   Varicose veins of leg with pain, right 03/12/2018   Enlarged thoracic aorta ([place]) 02/24/2018   Alcoholism /alcohol abuse 02/24/2018   Paresthesia 02/24/2018   Ectatic aorta ([place]) 02/20/2018   Emphysema lung ([place]) 02/20/2018   Atherosclerosis of aorta ([place]) 02/20/2018   Hypertension, benign 08/31/2015   Tobacco use 08/31/2015    Past Surgical History:  Procedure Laterality Date   ABDOMINAL AORTOGRAM W/LOWER EXTREMITY N/A 09/13/2019   Procedure: ABDOMINAL  AORTOGRAM W/LOWER EXTREMITY;  Surgeon: Waynetta Sandy, MD;  Location: [facility];  Service: Cardiovascular;  Laterality: N/A;   AMPUTATION Right 05/09/2020   Procedure: RIGHT BELOW KNEE AMPUTATION;  Surgeon: Waynetta Sandy, MD;  Location: [place];  Service: Vascular;  Laterality: Right;  w/ a block   COLONOSCOPY WITH PROPOFOL N/A 02/15/2016   Procedure: COLONOSCOPY WITH PROPOFOL;  Surgeon: Lucilla Lame, MD;  Location: [place];  Service: Endoscopy;  Laterality: N/A;   FEMORAL-POPLITEAL BYPASS GRAFT Right 09/14/2019   Procedure: BYPASS GRAFT FEMORAL-POPLITEAL ARTERY;  Surgeon: Waynetta Sandy, MD;   Location: [place];  Service: Vascular;  Laterality: Right;   HERNIA REPAIR  1999   left inguinal   INSERTION OF ILIAC STENT Right 09/14/2019   Procedure: Insertion Of Common and External Iliac Stent;  Surgeon: Waynetta Sandy, MD;  Location: [place];  Service: Vascular;  Laterality: Right;   LOWER EXTREMITY ANGIOGRAPHY Right 05/07/2018   Procedure: LOWER EXTREMITY ANGIOGRAPHY;  Surgeon: Algernon Huxley, MD;  Location: [facility];  Service: Cardiovascular;  Laterality: Right;   LOWER EXTREMITY ANGIOGRAPHY Right 06/25/2018   Procedure: LOWER EXTREMITY ANGIOGRAPHY;  Surgeon: Algernon Huxley, MD;  Location: [place] [facility];  Service: Cardiovascular;  Laterality: Right;   LOWER EXTREMITY ANGIOGRAPHY Right 07/01/2018   Procedure: LOWER EXTREMITY ANGIOGRAPHY;  Surgeon: Algernon Huxley, MD;  Location: [facility];  Service: Cardiovascular;  Laterality: Right;   LOWER EXTREMITY ANGIOGRAPHY Right 08/12/2018   Procedure: LOWER EXTREMITY ANGIOGRAPHY;  Surgeon: Algernon Huxley, MD;  Location: [facility];  Service: Cardiovascular;  Laterality: Right;   LOWER EXTREMITY ANGIOGRAPHY Right 09/03/2018   Procedure: LOWER EXTREMITY ANGIOGRAPHY;  Surgeon: Algernon Huxley, MD;  Location: [facility];  Service: Cardiovascular;  Laterality: Right;   LOWER EXTREMITY ANGIOGRAPHY Right 09/04/2018   Procedure: Lower Extremity Angiography;  Surgeon: Algernon Huxley, MD;  Location: [place];  Service: Cardiovascular;  Laterality: Right;   LOWER EXTREMITY ANGIOGRAPHY Right 10/01/2018   Procedure: LOWER EXTREMITY ANGIOGRAPHY;  Surgeon: Algernon Huxley, MD;  Location: [place] [facility];  Service: Cardiovascular;  Laterality: Right;   LOWER EXTREMITY ANGIOGRAPHY Right 10/01/2018   Procedure: Lower Extremity Angiography;  Surgeon: Algernon Huxley, MD;  Location: [facility];  Service: Cardiovascular;  Laterality: Right;   LOWER EXTREMITY ANGIOGRAPHY Right 10/15/2018   Procedure: LOWER EXTREMITY  ANGIOGRAPHY;  Surgeon: Algernon Huxley, MD;  Location: [facility];  Service: Cardiovascular;  Laterality: Right;   LOWER EXTREMITY ANGIOGRAPHY Left 10/16/2018   Procedure: Lower Extremity Angiography;  Surgeon: Algernon Huxley, MD;  Location: [facility];  Service: Cardiovascular;  Laterality: Left;   LOWER EXTREMITY ANGIOGRAPHY Left 01/20/2019   Procedure: LOWER EXTREMITY ANGIOGRAPHY;  Surgeon: Algernon Huxley, MD;  Location: [facility];  Service: Cardiovascular;  Laterality: Left;   LOWER EXTREMITY ANGIOGRAPHY Right 01/21/2019   Procedure: Lower Extremity Angiography;  Surgeon: Algernon Huxley, MD;  Location: [place] [facility];  Service: Cardiovascular;  Laterality: Right;   LOWER EXTREMITY ANGIOGRAPHY Right 01/28/2019   Procedure: LOWER EXTREMITY ANGIOGRAPHY;  Surgeon: Algernon Huxley, MD;  Location: [facility];  Service: Cardiovascular;  Laterality: Right;   LOWER EXTREMITY ANGIOGRAPHY Right 01/29/2019   Procedure: Lower Extremity Angiography;  Surgeon: Algernon Huxley, MD;  Location: [facility];  Service: Cardiovascular;  Laterality: Right;   LOWER EXTREMITY ANGIOGRAPHY Right 04/04/2020  Procedure: LOWER EXTREMITY ANGIOGRAPHY;  Surgeon: Waynetta Sandy, MD;  Location: [facility];  Service: Cardiovascular;  Laterality: Right;   LOWER EXTREMITY INTERVENTION Right 07/02/2018   Procedure: LOWER EXTREMITY INTERVENTION;  Surgeon: Algernon Huxley, MD;  Location: [facility];  Service: Cardiovascular;  Laterality: Right;   PERIPHERAL VASCULAR BALLOON ANGIOPLASTY Left 04/04/2020   Procedure: PERIPHERAL VASCULAR BALLOON ANGIOPLASTY;  Surgeon: Waynetta Sandy, MD;  Location: [place] [facility];  Service: Cardiovascular;  Laterality: Left;  external iliac   POLYPECTOMY N/A 02/15/2016   Procedure: POLYPECTOMY;  Surgeon: Lucilla Lame, MD;  Location: [place] [place];  Service: Endoscopy;  Laterality: N/A;    Family History  Problem Relation  Age of Onset   COPD Mother    Hypertension Father    Heart attack Brother     Social History   Tobacco Use   Smoking status: Former    Packs/day: 0.50    Years: 40.00    Total pack years: 20.00    Types: Cigarettes    Start date: 07/21/1978    Quit date: 12/2018    Years since quitting: 3.6   Smokeless tobacco: Never   Tobacco comments:    Started using cocaine recently but states he will try to quit   Substance Use Topics   Alcohol use: Not Currently    Alcohol/week: 12.0 standard drinks of alcohol    Types: 12 Cans of beer per week    Comment: 4 beers / week now per pt 05/05/20     Current Outpatient Medications:    albuterol (VENTOLIN HFA) 108 (90 Base) MCG/ACT inhaler, Inhale 2 puffs into the lungs every 6 (six) hours as needed for wheezing or shortness of breath., Disp: 18 g, Rfl: 0   amLODipine-valsartan (EXFORGE) 5-160 MG tablet, Take 1 tablet by mouth daily., Disp: 90 tablet, Rfl: 1   ascorbic acid (VITAMIN C) 500 MG tablet, Take 1 tablet (500 mg total) by mouth daily., Disp: 30 tablet, Rfl: 0   aspirin EC 81 MG tablet, Take 1 tablet (81 mg total) by mouth daily., Disp: 90 tablet, Rfl: 3   atorvastatin (LIPITOR) 40 MG tablet, Take 1 tablet (40 mg total) by mouth daily., Disp: 90 tablet, Rfl: 1   benzonatate (TESSALON) 100 MG capsule, Take 1 capsule (100 mg total) by mouth every 8 (eight) hours as needed for cough. (Patient not taking: Reported on 05/14/2022), Disp: 21 capsule, Rfl: 0   Cholecalciferol (DIALYVITE VITAMIN D 5000) 125 MCG (5000 UT) capsule, Take 5,000 Units by mouth daily., Disp: , Rfl:    clopidogrel (PLAVIX) 75 MG tablet, Take 1 tablet (75 mg total) by mouth daily., Disp: 90 tablet, Rfl: 1   DULoxetine (CYMBALTA) 60 MG capsule, Take 1 capsule (60 mg total) by mouth daily., Disp: 90 capsule, Rfl: 1   Fluticasone-Umeclidin-Vilant (TRELEGY ELLIPTA) 100-62.5-25 MCG/ACT AEPB, Inhale 1 puff into the lungs daily., Disp: 3 each, Rfl: 1   gabapentin (NEURONTIN)  100 MG capsule, Take 1 capsule (100 mg total) by mouth 3 (three) times daily., Disp: 270 capsule, Rfl: 1   Multiple Vitamin (MULTIVITAMIN WITH MINERALS) TABS tablet, Take 1 tablet by mouth daily., Disp: , Rfl:    pantoprazole (PROTONIX) 20 MG tablet, Take 1 tablet (20 mg total) by mouth daily., Disp: 90 tablet, Rfl: 1   tadalafil (CIALIS) 20 MG tablet, Take 0.5-1 tablets (10-20 mg total) by mouth every other day as needed for erectile dysfunction., Disp: 10 tablet, Rfl: 2   traZODone (  DESYREL) 100 MG tablet, Take 1 tablet (100 mg total) by mouth at bedtime., Disp: 90 tablet, Rfl: 1  No Known Allergies  I personally reviewed active problem list, medication list, allergies, family history, social history, health maintenance with the patient/caregiver today.   ROS  ***  Objective  There were no vitals filed for this visit.  There is no height or weight on file to calculate BMI.  Physical Exam ***   PHQ2/9:    05/14/2022    2:57 PM 01/24/2022    9:05 AM 10/22/2021   10:28 AM 08/15/2021    9:58 AM 05/21/2021   10:03 AM  Depression screen PHQ 2/9  Decreased Interest 0 0 0 0 0  Down, Depressed, Hopeless 0 0 0 0 0  PHQ - 2 Score 0 0 0 0 0  Altered sleeping 0 0 0 0 0  Tired, decreased energy 0 0 0 0 0  Change in appetite 0 0 0 0 0  Feeling bad or failure about yourself  0 0 0 0 0  Trouble concentrating 0 0 0 0 0  Moving slowly or fidgety/restless 0 0 0 0 0  Suicidal thoughts 0 0 0 0 0  PHQ-9 Score 0 0 0 0 0    phq 9 is {gen pos [id number]   Fall Risk:    05/14/2022    2:56 PM 01/24/2022    9:04 AM 10/22/2021   10:28 AM 08/15/2021    9:58 AM 05/21/2021   10:03 AM  Fall Risk   Falls in the past year? 0 0 0 0 0  Number falls in past yr: 0 0 0  0  Injury with Fall? 0 0 0  0  Risk for fall due to : No Fall Risks No Fall Risks No Fall Risks  No Fall Risks  Follow up Falls prevention discussed Falls prevention discussed Falls prevention discussed Falls prevention discussed Falls  prevention discussed      Functional Status Survey:      Assessment & Plan  *** There are no diagnoses linked to this encounter.

## 2022-08-09 ENCOUNTER — Ambulatory Visit: Payer: Medicare HMO | Admitting: Family Medicine

## 2022-08-26 NOTE — Progress Notes (Unsigned)
Name: Ryan [name]   MRN: [id number]    DOB: 1955/08/19   Date:08/27/2022       Progress Note  Subjective  Chief Complaint  Follow Up  HPI  Phantom pains: still experiencing phantom pains but states they are "normal". It is aggravated by walking ( uses prosthesis) for a prolonged period of time, he states the pain is shooting and sharp at times   Malnutrition: he had lost almost 10 % of his body weight before his last visit May 2023  at the time he told me that the only change was that he was  doing cocaine a few times a week for the previous two months. Today he is down another 23 lbs since last May. He states has not used any cocaine for past 4 months. He wants to hold off on labs, try Ensure and return in one month for weight check   Dysthymia: States he is doing "good", taking Duloxetine and denies side effects . Denies sadness or depressed mood today , he states he worries a lot about relatives and friends that are in need   Insomnia: taking Trazodone 100 mg every night and is able to fall asleep around midnight and wakes up around 6 am.   HTN/CKI stage III : He is on ARB, his last GFR was up from 31 to 38 . He denies chest pain or palpitation we will recheck labs next visit as requested by patient He has good urine output and denies pruritus   Emphysema: he states out of Trelegy due to cost, he continues to have a cough and also wheezes at night He denies SOB   Atherosclerosis of Ectasy ascending aorta: reviewed last CT and no aneurysm but  ascending aorta was 3.8 cm . He sees Dr. [name] for his PVD, but patient is not sure if Dr. [name] knows about the aneurysm. He takes atorvastatin 40 mg daily , he states he is compliant , we will recheck labs next visit He needs to go back to follow up with Dr. [name]  History of Alcoholism: he is off B12 because level was high, also off B1. He no longer drinks , since he had leg amputated back in 2021   ED: he still has Cialis at home   Patient  Active Problem List   Diagnosis Date Noted   Cocaine use disorder, mild, in early remission ([place]) 08/27/2022   History of alcoholism ([place]) 01/24/2022   History of amputation below knee, right ([place]) 01/24/2022   Moderate protein malnutrition ([place]) 01/24/2022   Benign hypertension with chronic kidney disease, stage III ([place]) 01/24/2022   Aneurysm of ascending aorta without rupture ([place]) 01/24/2022   Stage 3a chronic kidney disease ([place]) 01/24/2022   Phantom pain after amputation of lower extremity ([place])    Below-knee amputation of right lower extremity ([place]) 05/17/2020   Iron deficiency anemia 11/23/2018   Stage 3 chronic kidney disease ([place]) 10/21/2018   Atherosclerosis of native arteries of extremity with rest pain ([place]) 10/13/2018   PAD (peripheral artery disease) ([place]) 07/21/2018   Claudication ([place]) 03/17/2018   B12 deficiency 03/12/2018   Vitamin B1 deficiency 03/12/2018   Varicose veins of leg with pain, right 03/12/2018   Enlarged thoracic aorta ([place]) 02/24/2018   Alcoholism /alcohol abuse 02/24/2018   Paresthesia 02/24/2018   Ectatic aorta ([place]) 02/20/2018   Emphysema lung ([place]) 02/20/2018   Atherosclerosis of aorta ([place]) 02/20/2018   Hypertension, benign 08/31/2015   Tobacco use 08/31/2015  Past Surgical History:  Procedure Laterality Date   ABDOMINAL AORTOGRAM W/LOWER EXTREMITY N/A 09/13/2019   Procedure: ABDOMINAL AORTOGRAM W/LOWER EXTREMITY;  Surgeon: Waynetta Sandy, MD;  Location: [facility];  Service: Cardiovascular;  Laterality: N/A;   AMPUTATION Right 05/09/2020   Procedure: RIGHT BELOW KNEE AMPUTATION;  Surgeon: Waynetta Sandy, MD;  Location: [facility];  Service: Vascular;  Laterality: Right;  w/ a block   COLONOSCOPY WITH PROPOFOL N/A 02/15/2016   Procedure: COLONOSCOPY WITH PROPOFOL;  Surgeon: Lucilla Lame, MD;  Location: [facility];  Service: Endoscopy;  Laterality: N/A;   FEMORAL-POPLITEAL BYPASS GRAFT Right 09/14/2019    Procedure: BYPASS GRAFT FEMORAL-POPLITEAL ARTERY;  Surgeon: Waynetta Sandy, MD;  Location: [facility];  Service: Vascular;  Laterality: Right;   HERNIA REPAIR  1999   left inguinal   INSERTION OF ILIAC STENT Right 09/14/2019   Procedure: Insertion Of Common and External Iliac Stent;  Surgeon: Waynetta Sandy, MD;  Location: [facility];  Service: Vascular;  Laterality: Right;   LOWER EXTREMITY ANGIOGRAPHY Right 05/07/2018   Procedure: LOWER EXTREMITY ANGIOGRAPHY;  Surgeon: Algernon Huxley, MD;  Location: [facility];  Service: Cardiovascular;  Laterality: Right;   LOWER EXTREMITY ANGIOGRAPHY Right 06/25/2018   Procedure: LOWER EXTREMITY ANGIOGRAPHY;  Surgeon: Algernon Huxley, MD;  Location: [facility];  Service: Cardiovascular;  Laterality: Right;   LOWER EXTREMITY ANGIOGRAPHY Right 07/01/2018   Procedure: LOWER EXTREMITY ANGIOGRAPHY;  Surgeon: Algernon Huxley, MD;  Location: [facility];  Service: Cardiovascular;  Laterality: Right;   LOWER EXTREMITY ANGIOGRAPHY Right 08/12/2018   Procedure: LOWER EXTREMITY ANGIOGRAPHY;  Surgeon: Algernon Huxley, MD;  Location: [facility];  Service: Cardiovascular;  Laterality: Right;   LOWER EXTREMITY ANGIOGRAPHY Right 09/03/2018   Procedure: LOWER EXTREMITY ANGIOGRAPHY;  Surgeon: Algernon Huxley, MD;  Location: [facility];  Service: Cardiovascular;  Laterality: Right;   LOWER EXTREMITY ANGIOGRAPHY Right 09/04/2018   Procedure: Lower Extremity Angiography;  Surgeon: Algernon Huxley, MD;  Location: [facility];  Service: Cardiovascular;  Laterality: Right;   LOWER EXTREMITY ANGIOGRAPHY Right 10/01/2018   Procedure: LOWER EXTREMITY ANGIOGRAPHY;  Surgeon: Algernon Huxley, MD;  Location: [facility];  Service: Cardiovascular;  Laterality: Right;   LOWER EXTREMITY ANGIOGRAPHY Right 10/01/2018   Procedure: Lower Extremity Angiography;  Surgeon: Algernon Huxley, MD;  Location: [facility];  Service: Cardiovascular;   Laterality: Right;   LOWER EXTREMITY ANGIOGRAPHY Right 10/15/2018   Procedure: LOWER EXTREMITY ANGIOGRAPHY;  Surgeon: Algernon Huxley, MD;  Location: [facility];  Service: Cardiovascular;  Laterality: Right;   LOWER EXTREMITY ANGIOGRAPHY Left 10/16/2018   Procedure: Lower Extremity Angiography;  Surgeon: Algernon Huxley, MD;  Location: [facility];  Service: Cardiovascular;  Laterality: Left;   LOWER EXTREMITY ANGIOGRAPHY Left 01/20/2019   Procedure: LOWER EXTREMITY ANGIOGRAPHY;  Surgeon: Algernon Huxley, MD;  Location: [facility];  Service: Cardiovascular;  Laterality: Left;   LOWER EXTREMITY ANGIOGRAPHY Right 01/21/2019   Procedure: Lower Extremity Angiography;  Surgeon: Algernon Huxley, MD;  Location: [facility];  Service: Cardiovascular;  Laterality: Right;   LOWER EXTREMITY ANGIOGRAPHY Right 01/28/2019   Procedure: LOWER EXTREMITY ANGIOGRAPHY;  Surgeon: Algernon Huxley, MD;  Location: [facility];  Service: Cardiovascular;  Laterality: Right;   LOWER EXTREMITY ANGIOGRAPHY Right 01/29/2019   Procedure: Lower Extremity Angiography;  Surgeon: Algernon Huxley, MD;  Location: [facility];  Service: Cardiovascular;  Laterality: Right;   LOWER EXTREMITY ANGIOGRAPHY Right 04/04/2020   Procedure: LOWER EXTREMITY ANGIOGRAPHY;  Surgeon: Waynetta Sandy, MD;  Location: [facility];  Service: Cardiovascular;  Laterality: Right;   LOWER EXTREMITY INTERVENTION Right 07/02/2018   Procedure: LOWER EXTREMITY INTERVENTION;  Surgeon: Algernon Huxley, MD;  Location: [facility];  Service: Cardiovascular;  Laterality: Right;   PERIPHERAL VASCULAR BALLOON ANGIOPLASTY Left 04/04/2020   Procedure: PERIPHERAL VASCULAR BALLOON ANGIOPLASTY;  Surgeon: Waynetta Sandy, MD;  Location: [facility];  Service: Cardiovascular;  Laterality: Left;  external iliac   POLYPECTOMY N/A 02/15/2016   Procedure: POLYPECTOMY;  Surgeon: Lucilla Lame, MD;  Location: [facility];  Service: Endoscopy;  Laterality: N/A;    Family History  Problem Relation Age of Onset   COPD Mother    Hypertension Father    Heart attack Brother     Social History   Tobacco Use   Smoking status: Former    Packs/day: 0.50    Years: 40.00    Total pack years: 20.00    Types: Cigarettes    Start date: 07/21/1978    Quit date: 12/2018    Years since quitting: 3.6   Smokeless tobacco: Never   Tobacco comments:    Started using cocaine recently but states he will try to quit   Substance Use Topics   Alcohol use: Not Currently    Alcohol/week: 12.0 standard drinks of alcohol    Types: 12 Cans of beer per week    Comment: 4 beers / week now per pt 05/05/20     Current Outpatient Medications:    albuterol (VENTOLIN HFA) 108 (90 Base) MCG/ACT inhaler, Inhale 2 puffs into the lungs every 6 (six) hours as needed for wheezing or shortness of breath., Disp: 18 g, Rfl: 0   amLODipine-valsartan (EXFORGE) 5-160 MG tablet, Take 1 tablet by mouth daily., Disp: 90 tablet, Rfl: 1   ascorbic acid (VITAMIN C) 500 MG tablet, Take 1 tablet (500 mg total) by mouth daily., Disp: 30 tablet, Rfl: 0   aspirin EC 81 MG tablet, Take 1 tablet (81 mg total) by mouth daily., Disp: 90 tablet, Rfl: 3   atorvastatin (LIPITOR) 40 MG tablet, Take 1 tablet (40 mg total) by mouth daily., Disp: 90 tablet, Rfl: 1   Cholecalciferol (DIALYVITE VITAMIN D 5000) 125 MCG (5000 UT) capsule, Take 5,000 Units by mouth daily., Disp: , Rfl:    clopidogrel (PLAVIX) 75 MG tablet, Take 1 tablet (75 mg total) by mouth daily., Disp: 90 tablet, Rfl: 1   DULoxetine (CYMBALTA) 60 MG capsule, Take 1 capsule (60 mg total) by mouth daily., Disp: 90 capsule, Rfl: 1   Fluticasone-Umeclidin-Vilant (TRELEGY ELLIPTA) 100-62.5-25 MCG/ACT AEPB, Inhale 1 puff into the lungs daily., Disp: 3 each, Rfl: 1   gabapentin (NEURONTIN) 100 MG capsule, Take 1 capsule (100 mg total) by mouth 3 (three) times daily., Disp: 270 capsule, Rfl:  1   Multiple Vitamin (MULTIVITAMIN WITH MINERALS) TABS tablet, Take 1 tablet by mouth daily., Disp: , Rfl:    pantoprazole (PROTONIX) 20 MG tablet, Take 1 tablet (20 mg total) by mouth daily., Disp: 90 tablet, Rfl: 1   tadalafil (CIALIS) 20 MG tablet, Take 0.5-1 tablets (10-20 mg total) by mouth every other day as needed for erectile dysfunction., Disp: 10 tablet, Rfl: 2   traZODone (DESYREL) 100 MG tablet, Take 1 tablet (100 mg total) by mouth at bedtime.,  Disp: 90 tablet, Rfl: 1  No Known Allergies  I personally reviewed active problem list, medication list, allergies, family history, social history, health maintenance with the patient/caregiver today.   ROS  Constitutional: Negative for fever or weight change.  Respiratory: Negative for cough and shortness of breath.   Cardiovascular: Negative for chest pain or palpitations.  Gastrointestinal: Negative for abdominal pain, no bowel changes.  Musculoskeletal: Negative for gait problem or joint swelling.  Skin: Negative for rash.  Neurological: Negative for dizziness or headache.  No other specific complaints in a complete review of systems (except as listed in HPI above).   Objective  Vitals:   08/27/22 1111  BP: 92/64  Pulse: 94  Resp: 18  Temp: 97.9 F (36.6 C)  TempSrc: Oral  SpO2: 98%  Weight: 121 lb 1.6 oz (54.9 kg)  Height: '5\' 8"'$  (1.727 m)    Body mass index is 18.41 kg/m.  Physical Exam  Constitutional: Patient appears well-developed and cachectic  No distress.  HEENT: head atraumatic, normocephalic, pupils equal and reactive to light, neck supple Cardiovascular: Normal rate, regular rhythm and normal heart sounds.  No murmur heard. No BLE edema. Pulmonary/Chest: Effort normal and breath sounds normal. No respiratory distress. Abdominal: Soft.  There is no tenderness. Psychiatric: Patient has a normal mood and affect. behavior is normal. Judgment and thought content normal.    PHQ2/9:    08/27/2022   11:13  AM 05/14/2022    2:57 PM 01/24/2022    9:05 AM 10/22/2021   10:28 AM 08/15/2021    9:58 AM  Depression screen PHQ 2/9  Decreased Interest 0 0 0 0 0  Down, Depressed, Hopeless 0 0 0 0 0  PHQ - 2 Score 0 0 0 0 0  Altered sleeping 0 0 0 0 0  Tired, decreased energy 0 0 0 0 0  Change in appetite 3 0 0 0 0  Feeling bad or failure about yourself  0 0 0 0 0  Trouble concentrating 0 0 0 0 0  Moving slowly or fidgety/restless 0 0 0 0 0  Suicidal thoughts 0 0 0 0 0  PHQ-9 Score 3 0 0 0 0  Difficult doing work/chores Not difficult at all        phq 9 is negative   Fall Risk:    08/27/2022   11:13 AM 05/14/2022    2:56 PM 01/24/2022    9:04 AM 10/22/2021   10:28 AM 08/15/2021    9:58 AM  Fall Risk   Falls in the past year? 0 0 0 0 0  Number falls in past yr:  0 0 0   Injury with Fall?  0 0 0   Risk for fall due to : No Fall Risks No Fall Risks No Fall Risks No Fall Risks   Follow up Falls prevention discussed Falls prevention discussed Falls prevention discussed Falls prevention discussed Falls prevention discussed     Assessment & Plan  1. Stage 3a chronic kidney disease ([place])  Recheck labs next visit  2. History of alcoholism ([place])  In remission  3. Benign hypertension with chronic kidney disease, stage III ([place])  Recheck labs next visit   4. Pulmonary emphysema, unspecified emphysema type ([place])  We will contact Alex to see if he qualifies for assistance program ( [place])  5. PAD (peripheral artery disease) ([place])  Needs to follow up at least yearly with vascular surgeon  6. Phantom pain after amputation of lower extremity ([facility])  stable  7. Cocaine use disorder, mild, in early remission ([place])  monitor  8. Moderate protein malnutrition ([place])  Discussed checking labs today but he asked to hold off, he will try Ensure. He states no longer using cocaine  9. History of amputation below knee, right ([facility])  Wearing a prosthesis right lower extremity  10. Atherosclerosis  of aorta ([place])  On statin therapy   11. Enlarged thoracic aorta ([place])  Reminded him to follow up with Dr. [name]  12. Needs flu shot  - Flu Vaccine QUAD 6+ mos PF IM (Fluarix Quad PF)  13. Colon cancer screening  - Ambulatory referral to Gastroenterology  14. Hypertension, benign  At goal   15. Other insomnia   16. ED (erectile dysfunction) of organic origin   34. B12 deficiency   18. Vitamin B1 deficiency   19. History of iron deficiency

## 2022-08-27 ENCOUNTER — Encounter: Payer: Self-pay | Admitting: Family Medicine

## 2022-08-27 ENCOUNTER — Ambulatory Visit (INDEPENDENT_AMBULATORY_CARE_PROVIDER_SITE_OTHER): Payer: 59 | Admitting: Family Medicine

## 2022-08-27 VITALS — BP 92/64 | HR 94 | Temp 97.9°F | Resp 18 | Ht 68.0 in | Wt 121.1 lb

## 2022-08-27 DIAGNOSIS — Z8639 Personal history of other endocrine, nutritional and metabolic disease: Secondary | ICD-10-CM

## 2022-08-27 DIAGNOSIS — E440 Moderate protein-calorie malnutrition: Secondary | ICD-10-CM

## 2022-08-27 DIAGNOSIS — F1411 Cocaine abuse, in remission: Secondary | ICD-10-CM

## 2022-08-27 DIAGNOSIS — I700 Atherosclerosis of aorta: Secondary | ICD-10-CM

## 2022-08-27 DIAGNOSIS — Z23 Encounter for immunization: Secondary | ICD-10-CM | POA: Diagnosis not present

## 2022-08-27 DIAGNOSIS — N183 Chronic kidney disease, stage 3 unspecified: Secondary | ICD-10-CM

## 2022-08-27 DIAGNOSIS — F1021 Alcohol dependence, in remission: Secondary | ICD-10-CM

## 2022-08-27 DIAGNOSIS — Z122 Encounter for screening for malignant neoplasm of respiratory organs: Secondary | ICD-10-CM

## 2022-08-27 DIAGNOSIS — G546 Phantom limb syndrome with pain: Secondary | ICD-10-CM

## 2022-08-27 DIAGNOSIS — I739 Peripheral vascular disease, unspecified: Secondary | ICD-10-CM | POA: Diagnosis not present

## 2022-08-27 DIAGNOSIS — I129 Hypertensive chronic kidney disease with stage 1 through stage 4 chronic kidney disease, or unspecified chronic kidney disease: Secondary | ICD-10-CM | POA: Diagnosis not present

## 2022-08-27 DIAGNOSIS — J439 Emphysema, unspecified: Secondary | ICD-10-CM

## 2022-08-27 DIAGNOSIS — E538 Deficiency of other specified B group vitamins: Secondary | ICD-10-CM

## 2022-08-27 DIAGNOSIS — G4709 Other insomnia: Secondary | ICD-10-CM

## 2022-08-27 DIAGNOSIS — E519 Thiamine deficiency, unspecified: Secondary | ICD-10-CM

## 2022-08-27 DIAGNOSIS — N529 Male erectile dysfunction, unspecified: Secondary | ICD-10-CM

## 2022-08-27 DIAGNOSIS — I7789 Other specified disorders of arteries and arterioles: Secondary | ICD-10-CM

## 2022-08-27 DIAGNOSIS — Z89511 Acquired absence of right leg below knee: Secondary | ICD-10-CM | POA: Diagnosis not present

## 2022-08-27 DIAGNOSIS — Z1211 Encounter for screening for malignant neoplasm of colon: Secondary | ICD-10-CM | POA: Diagnosis not present

## 2022-08-27 DIAGNOSIS — I10 Essential (primary) hypertension: Secondary | ICD-10-CM

## 2022-08-27 DIAGNOSIS — N1831 Chronic kidney disease, stage 3a: Secondary | ICD-10-CM | POA: Diagnosis not present

## 2022-08-28 ENCOUNTER — Other Ambulatory Visit: Payer: Self-pay

## 2022-08-28 DIAGNOSIS — J441 Chronic obstructive pulmonary disease with (acute) exacerbation: Secondary | ICD-10-CM

## 2022-08-28 DIAGNOSIS — J439 Emphysema, unspecified: Secondary | ICD-10-CM

## 2022-09-04 ENCOUNTER — Telehealth: Payer: Self-pay

## 2022-09-04 ENCOUNTER — Encounter: Payer: Self-pay | Admitting: *Deleted

## 2022-09-04 NOTE — Progress Notes (Signed)
   Care Guide Note  09/04/2022 Name: TYSE AURIEMMA MRN: [id number] DOB: July 23, 1955  Referred by: Steele Sizer, MD Reason for referral : Care Coordination (Outreach to schedule with Pharm d )   RASHOD GOUGEON is a 67 y.o. year old male who is a primary care patient of Steele Sizer, MD. GERRICK RAY was referred to the pharmacist for assistance related to COPD.    An unsuccessful telephone outreach was attempted today to contact the patient who was referred to the pharmacy team for assistance with medication assistance. Additional attempts will be made to contact the patient.   Noreene Larsson, [place], [place] [address] Direct Dial: [phone] [email or web address]

## 2022-09-13 ENCOUNTER — Other Ambulatory Visit: Payer: Self-pay | Admitting: Family Medicine

## 2022-09-13 DIAGNOSIS — G4709 Other insomnia: Secondary | ICD-10-CM

## 2022-09-13 NOTE — Telephone Encounter (Signed)
Medication Refill - Medication: traZODone (DESYREL) 100 MG tablet   Has the patient contacted their pharmacy? No. (Agent: If no, request that the patient contact the pharmacy for the refill. If patient does not wish to contact the pharmacy document the reason why and proceed with request.) (Agent: If yes, when and what did the pharmacy advise?)  Preferred Pharmacy (with phone number or street name):  [facility] ([address]), [place] - [address]  [id number] [place] ([place]) [place] [address]  Phone: [phone] Fax: [phone]  Hours: Not open 24 hours   Has the patient been seen for an appointment in the last year OR does the patient have an upcoming appointment? Yes.    Agent: Please be advised that RX refills may take up to 3 business days. We ask that you follow-up with your pharmacy.

## 2022-09-16 MED ORDER — TRAZODONE HCL 100 MG PO TABS: 100.0000 mg | ORAL_TABLET | Freq: Every day | ORAL | 1 refills | Status: DC

## 2022-09-16 NOTE — Telephone Encounter (Signed)
Requested Prescriptions  Pending Prescriptions Disp Refills   traZODone (DESYREL) 100 MG tablet 90 tablet 1    Sig: Take 1 tablet (100 mg total) by mouth at bedtime.     Psychiatry: Antidepressants - Serotonin Modulator Passed - 09/13/2022  3:20 PM      Passed - Valid encounter within last 6 months    Recent Outpatient Visits           2 weeks ago Stage 3a chronic kidney disease [facility])   [facility] Steele Sizer, MD   4 months ago ED (erectile dysfunction) of organic origin   [name] Steele Sizer, MD   7 months ago Stage 3a chronic kidney disease [facility])   [facility] Steele Sizer, MD   10 months ago PAD (peripheral artery disease) [facility]   [facility] Steele Sizer, MD   1 year ago COPD with acute exacerbation [facility])   [facility] Steele Sizer, MD       Future Appointments             In 3 weeks Steele Sizer, MD [facility], [place]   In 1 month Steele Sizer, MD [facility], [facility]

## 2022-09-25 ENCOUNTER — Encounter: Payer: Self-pay | Admitting: *Deleted

## 2022-10-07 NOTE — Progress Notes (Signed)
   Care Guide Note  10/07/2022 Name: [name] MRN: [id number] DOB: 1955/11/05  Referred by: Alba Cory, MD Reason for referral : Care Coordination (Outreach to schedule with Pharm d )   Ryan [name] is a 67 y.o. year old male who is a primary care patient of Alba Cory, MD. [name] was referred to the pharmacist for assistance related to HTN.    A second unsuccessful telephone outreach was attempted today to contact the patient who was referred to the pharmacy team for assistance with medication assistance. Additional attempts will be made to contact the patient.  Ryan [name], RMA Care Guide [facility]  [place], [place] [address] Direct Dial: [phone] [email or web address]

## 2022-10-08 NOTE — Progress Notes (Unsigned)
Name: Ryan [name]   MRN: [id number]    DOB: 1956-04-09   Date:10/09/2022       Progress Note  Subjective  Chief Complaint  Follow Up  HPI  Malnutrition: he had lost almost 10 % of his body weight before his last visit May 2023  at the time he told me that the only change was that he was  doing cocaine a few times a week for the previous two months. Today he was  down another 23 lbs in 11 months.  One month ago his weight was down to 121 lbs , he states he has been drinking Ensure and eating more often, he has gained 6 lbs since last visit. He denies using any cocaine in 5 months   HTN/CKI stage III : He is on ARB.  He denies chest pain or palpitation, he denies pruritus and has a good urine output His bp was very low last visit, today is elevated but he has not taken his bp medications this am because he came in fasting, we will continue current regiment until he follows up with me next month  History of Alcoholism: he is off B12 because level was high, also off B1. He no longer drinks , since he had leg amputated back in 2021 We will recheck labs today   Atherosclerosis of aorta: we will recheck lipid panel today, s/p amputation right leg due to PAD, on statin therapy   Emphysema: he states Trelegy is costly and needs assistance. He still smokes, he is due for lung CT screen, we will place a referral to pharmacist   Hearing loss: he has hearing aids but states feels like something is in his ear canal and would like for me to check   Patient Active Problem List   Diagnosis Date Noted   Cocaine use disorder, mild, in early remission 08/27/2022   History of alcoholism 01/24/2022   History of amputation below knee, right 01/24/2022   Moderate protein malnutrition 01/24/2022   Benign hypertension with chronic kidney disease, stage III 01/24/2022   Aneurysm of ascending aorta without rupture 01/24/2022   Stage 3a chronic kidney disease 01/24/2022   Phantom pain after amputation of  lower extremity    Below-knee amputation of right lower extremity 05/17/2020   Iron deficiency anemia 11/23/2018   Stage 3 chronic kidney disease 10/21/2018   Atherosclerosis of native arteries of extremity with rest pain 10/13/2018   PAD (peripheral artery disease) 07/21/2018   Claudication 03/17/2018   B12 deficiency 03/12/2018   Vitamin B1 deficiency 03/12/2018   Varicose veins of leg with pain, right 03/12/2018   Enlarged thoracic aorta 02/24/2018   Alcoholism /alcohol abuse 02/24/2018   Paresthesia 02/24/2018   Ectatic aorta 02/20/2018   Emphysema lung 02/20/2018   Atherosclerosis of aorta 02/20/2018   Hypertension, benign 08/31/2015   Tobacco use 08/31/2015    Past Surgical History:  Procedure Laterality Date   ABDOMINAL AORTOGRAM W/LOWER EXTREMITY N/A 09/13/2019   Procedure: ABDOMINAL AORTOGRAM W/LOWER EXTREMITY;  Surgeon: Maeola Harman, MD;  Location: [facility] INVASIVE CV LAB;  Service: Cardiovascular;  Laterality: N/A;   AMPUTATION Right 05/09/2020   Procedure: RIGHT BELOW KNEE AMPUTATION;  Surgeon: Maeola Harman, MD;  Location: [facility] OR;  Service: Vascular;  Laterality: Right;  w/ a block   COLONOSCOPY WITH PROPOFOL N/A 02/15/2016   Procedure: COLONOSCOPY WITH PROPOFOL;  Surgeon: [name], MD;  Location: [facility] SURGERY [facility];  Service: Endoscopy;  Laterality: N/A;   FEMORAL-POPLITEAL BYPASS GRAFT  Right 09/14/2019   Procedure: BYPASS GRAFT FEMORAL-POPLITEAL ARTERY;  Surgeon: Maeola Harman, MD;  Location: [facility] OR;  Service: Vascular;  Laterality: Right;   HERNIA REPAIR  1999   left inguinal   INSERTION OF ILIAC STENT Right 09/14/2019   Procedure: Insertion Of Common and External Iliac Stent;  Surgeon: Maeola Harman, MD;  Location: [facility] OR;  Service: Vascular;  Laterality: Right;   LOWER EXTREMITY ANGIOGRAPHY Right 05/07/2018   Procedure: LOWER EXTREMITY ANGIOGRAPHY;  Surgeon: Annice Needy, MD;  Location: [facility] INVASIVE CV LAB;  Service:  Cardiovascular;  Laterality: Right;   LOWER EXTREMITY ANGIOGRAPHY Right 06/25/2018   Procedure: LOWER EXTREMITY ANGIOGRAPHY;  Surgeon: Annice Needy, MD;  Location: [facility] INVASIVE CV LAB;  Service: Cardiovascular;  Laterality: Right;   LOWER EXTREMITY ANGIOGRAPHY Right 07/01/2018   Procedure: LOWER EXTREMITY ANGIOGRAPHY;  Surgeon: Annice Needy, MD;  Location: [facility] INVASIVE CV LAB;  Service: Cardiovascular;  Laterality: Right;   LOWER EXTREMITY ANGIOGRAPHY Right 08/12/2018   Procedure: LOWER EXTREMITY ANGIOGRAPHY;  Surgeon: Annice Needy, MD;  Location: [facility] INVASIVE CV LAB;  Service: Cardiovascular;  Laterality: Right;   LOWER EXTREMITY ANGIOGRAPHY Right 09/03/2018   Procedure: LOWER EXTREMITY ANGIOGRAPHY;  Surgeon: Annice Needy, MD;  Location: [facility] INVASIVE CV LAB;  Service: Cardiovascular;  Laterality: Right;   LOWER EXTREMITY ANGIOGRAPHY Right 09/04/2018   Procedure: Lower Extremity Angiography;  Surgeon: Annice Needy, MD;  Location: [facility] INVASIVE CV LAB;  Service: Cardiovascular;  Laterality: Right;   LOWER EXTREMITY ANGIOGRAPHY Right 10/01/2018   Procedure: LOWER EXTREMITY ANGIOGRAPHY;  Surgeon: Annice Needy, MD;  Location: [facility] INVASIVE CV LAB;  Service: Cardiovascular;  Laterality: Right;   LOWER EXTREMITY ANGIOGRAPHY Right 10/01/2018   Procedure: Lower Extremity Angiography;  Surgeon: Annice Needy, MD;  Location: [facility] INVASIVE CV LAB;  Service: Cardiovascular;  Laterality: Right;   LOWER EXTREMITY ANGIOGRAPHY Right 10/15/2018   Procedure: LOWER EXTREMITY ANGIOGRAPHY;  Surgeon: Annice Needy, MD;  Location: [facility] INVASIVE CV LAB;  Service: Cardiovascular;  Laterality: Right;   LOWER EXTREMITY ANGIOGRAPHY Left 10/16/2018   Procedure: Lower Extremity Angiography;  Surgeon: Annice Needy, MD;  Location: [facility] INVASIVE CV LAB;  Service: Cardiovascular;  Laterality: Left;   LOWER EXTREMITY ANGIOGRAPHY Left 01/20/2019   Procedure: LOWER EXTREMITY ANGIOGRAPHY;  Surgeon: Annice Needy, MD;  Location: [facility] INVASIVE CV  LAB;  Service: Cardiovascular;  Laterality: Left;   LOWER EXTREMITY ANGIOGRAPHY Right 01/21/2019   Procedure: Lower Extremity Angiography;  Surgeon: Annice Needy, MD;  Location: [facility] INVASIVE CV LAB;  Service: Cardiovascular;  Laterality: Right;   LOWER EXTREMITY ANGIOGRAPHY Right 01/28/2019   Procedure: LOWER EXTREMITY ANGIOGRAPHY;  Surgeon: Annice Needy, MD;  Location: [facility] INVASIVE CV LAB;  Service: Cardiovascular;  Laterality: Right;   LOWER EXTREMITY ANGIOGRAPHY Right 01/29/2019   Procedure: Lower Extremity Angiography;  Surgeon: Annice Needy, MD;  Location: [facility] INVASIVE CV LAB;  Service: Cardiovascular;  Laterality: Right;   LOWER EXTREMITY ANGIOGRAPHY Right 04/04/2020   Procedure: LOWER EXTREMITY ANGIOGRAPHY;  Surgeon: Maeola Harman, MD;  Location: [facility] INVASIVE CV LAB;  Service: Cardiovascular;  Laterality: Right;   LOWER EXTREMITY INTERVENTION Right 07/02/2018   Procedure: LOWER EXTREMITY INTERVENTION;  Surgeon: Annice Needy, MD;  Location: [facility] INVASIVE CV LAB;  Service: Cardiovascular;  Laterality: Right;   PERIPHERAL VASCULAR BALLOON ANGIOPLASTY Left 04/04/2020   Procedure: PERIPHERAL VASCULAR BALLOON ANGIOPLASTY;  Surgeon: Maeola Harman, MD;  Location: [facility] INVASIVE CV LAB;  Service: Cardiovascular;  Laterality: Left;  external iliac   POLYPECTOMY N/A 02/15/2016   Procedure: POLYPECTOMY;  Surgeon: [name], MD;  Location: [facility];  Service: Endoscopy;  Laterality: N/A;    Family History  Problem Relation Age of Onset   COPD Mother    Hypertension Father    Heart attack Brother     Social History   Tobacco Use   Smoking status: Former    Packs/day: 0.50    Years: 40.00    Additional pack years: 0.00    Total pack years: 20.00    Types: Cigarettes    Start date: 07/21/1978    Quit date: 12/2018    Years since quitting: 3.7   Smokeless tobacco: Never   Tobacco comments:    Started using cocaine recently but states he will try to quit    Substance Use Topics   Alcohol use: Not Currently    Alcohol/week: 12.0 standard drinks of alcohol    Types: 12 Cans of beer per week    Comment: 4 beers / week now per pt 05/05/20     Current Outpatient Medications:    albuterol (VENTOLIN HFA) 108 (90 Base) MCG/ACT inhaler, Inhale 2 puffs into the lungs every 6 (six) hours as needed for wheezing or shortness of breath., Disp: 18 g, Rfl: 0   amLODipine-valsartan (EXFORGE) 5-160 MG tablet, Take 1 tablet by mouth daily., Disp: 90 tablet, Rfl: 1   ascorbic acid (VITAMIN C) 500 MG tablet, Take 1 tablet (500 mg total) by mouth daily., Disp: 30 tablet, Rfl: 0   aspirin EC 81 MG tablet, Take 1 tablet (81 mg total) by mouth daily., Disp: 90 tablet, Rfl: 3   atorvastatin (LIPITOR) 40 MG tablet, Take 1 tablet (40 mg total) by mouth daily., Disp: 90 tablet, Rfl: 1   Cholecalciferol (DIALYVITE VITAMIN D 5000) 125 MCG (5000 UT) capsule, Take 5,000 Units by mouth daily., Disp: , Rfl:    clopidogrel (PLAVIX) 75 MG tablet, Take 1 tablet (75 mg total) by mouth daily., Disp: 90 tablet, Rfl: 1   DULoxetine (CYMBALTA) 60 MG capsule, Take 1 capsule (60 mg total) by mouth daily., Disp: 90 capsule, Rfl: 1   Fluticasone-Umeclidin-Vilant (TRELEGY ELLIPTA) 100-62.5-25 MCG/ACT AEPB, Inhale 1 puff into the lungs daily., Disp: 3 each, Rfl: 1   gabapentin (NEURONTIN) 100 MG capsule, Take 1 capsule (100 mg total) by mouth 3 (three) times daily., Disp: 270 capsule, Rfl: 1   Multiple Vitamin (MULTIVITAMIN WITH MINERALS) TABS tablet, Take 1 tablet by mouth daily., Disp: , Rfl:    pantoprazole (PROTONIX) 20 MG tablet, Take 1 tablet (20 mg total) by mouth daily., Disp: 90 tablet, Rfl: 1   tadalafil (CIALIS) 20 MG tablet, Take 0.5-1 tablets (10-20 mg total) by mouth every other day as needed for erectile dysfunction., Disp: 10 tablet, Rfl: 2   traZODone (DESYREL) 100 MG tablet, Take 1 tablet (100 mg total) by mouth at bedtime., Disp: 90 tablet, Rfl: 1  No Known Allergies  I  personally reviewed active problem list, medication list, allergies, family history, social history, health maintenance with the patient/caregiver today.   ROS  Constitutional: Negative for fever or weight change.  Respiratory: Negative for cough and shortness of breath.   Cardiovascular: Negative for chest pain or palpitations.  Gastrointestinal: Negative for abdominal pain, no bowel changes.  Musculoskeletal: Negative for gait problem or joint swelling.  Skin: Negative for rash.  Neurological: Negative for dizziness or headache.  No other specific complaints in a complete review of systems (except  as listed in HPI above).   Objective  Vitals:   10/09/22 1056 10/09/22 1107  BP: (!) 158/80 (!) 158/84  Pulse: 72   Resp: 16   Weight: 127 lb (57.6 kg)   Height:  (1.727 m)     Body mass index is 19.31 kg/m.  Physical Exam  Constitutional: Patient appears well-developed and malnourished No distress.  HEENT: head atraumatic, normocephalic, pupils equal and reactive to light, ears cerumen impaction bilatearlly , neck supple, poor dentition Cardiovascular: Normal rate, regular rhythm and normal heart sounds.  No murmur heard. No BLE edema. Pulmonary/Chest: Effort normal and breath sounds normal. No respiratory distress. Abdominal: Soft.  There is no tenderness. Muscular skeletal: s/p amputation right lower leg and wearing prosthesis  Psychiatric: Patient has a normal mood and affect. behavior is normal. Judgment and thought content normal.   PHQ2/9:    10/09/2022   10:55 AM 08/27/2022   11:13 AM 05/14/2022    2:57 PM 01/24/2022    9:05 AM 10/22/2021   10:28 AM  Depression screen PHQ 2/9  Decreased Interest 0 0 0 0 0  Down, Depressed, Hopeless 0 0 0 0 0  PHQ - 2 Score 0 0 0 0 0  Altered sleeping 0 0 0 0 0  Tired, decreased energy 0 0 0 0 0  Change in appetite 0 3 0 0 0  Feeling bad or failure about yourself  0 0 0 0 0  Trouble concentrating 0 0 0 0 0  Moving slowly or  fidgety/restless 0 0 0 0 0  Suicidal thoughts 0 0 0 0 0  PHQ-9 Score 0 3 0 0 0  Difficult doing work/chores  Not difficult at all       phq 9 is negative   Fall Risk:    10/09/2022   10:54 AM 08/27/2022   11:13 AM 05/14/2022    2:56 PM 01/24/2022    9:04 AM 10/22/2021   10:28 AM  Fall Risk   Falls in the past year? 0 0 0 0 0  Number falls in past yr: 0  0 0 0  Injury with Fall? 0  0 0 0  Risk for fall due to : No Fall Risks No Fall Risks No Fall Risks No Fall Risks No Fall Risks  Follow up Falls prevention discussed Falls prevention discussed Falls prevention discussed Falls prevention discussed Falls prevention discussed      Functional Status Survey: Is the patient deaf or have difficulty hearing?: Yes Does the patient have difficulty seeing, even when wearing glasses/contacts?: Yes Does the patient have difficulty concentrating, remembering, or making decisions?: No Does the patient have difficulty walking or climbing stairs?: Yes Does the patient have difficulty dressing or bathing?: No Does the patient have difficulty doing errands alone such as visiting a doctor's office or shopping?: No    Assessment & Plan  1. Stage 3a chronic kidney disease  - CBC with Differential/Platelet - COMPLETE METABOLIC PANEL WITH GFR - VITAMIN D 25 Hydroxy (Vit-D Deficiency, Fractures) - Urine Microalbumin w/creat. ratio  2. Benign hypertension with chronic kidney disease, stage III  Take medications prior to visit  3. Vitamin B1 deficiency  - Vitamin B1  4. B12 deficiency  - Vitamin B12  5. Mild protein-calorie malnutrition  Continue Ensure   6. History of iron deficiency  - Iron, TIBC and Ferritin Panel  7. Atherosclerosis of aorta  - Lipid panel   8. Other emphysema  - AMB Referral to Pharmacy Medication  Management   9. Bilateral impacted cerumen  Verbal consent given Possible side effects discussed with patient Ears were  lavaged with warm water and peroxide   Patient tolerated procedure well No complications

## 2022-10-09 ENCOUNTER — Ambulatory Visit (INDEPENDENT_AMBULATORY_CARE_PROVIDER_SITE_OTHER): Payer: 59 | Admitting: Family Medicine

## 2022-10-09 ENCOUNTER — Encounter: Payer: Self-pay | Admitting: Family Medicine

## 2022-10-09 VITALS — BP 158/84 | HR 72 | Resp 16 | Ht 68.0 in | Wt 127.0 lb

## 2022-10-09 DIAGNOSIS — J438 Other emphysema: Secondary | ICD-10-CM | POA: Diagnosis not present

## 2022-10-09 DIAGNOSIS — I129 Hypertensive chronic kidney disease with stage 1 through stage 4 chronic kidney disease, or unspecified chronic kidney disease: Secondary | ICD-10-CM

## 2022-10-09 DIAGNOSIS — Z8639 Personal history of other endocrine, nutritional and metabolic disease: Secondary | ICD-10-CM | POA: Diagnosis not present

## 2022-10-09 DIAGNOSIS — E441 Mild protein-calorie malnutrition: Secondary | ICD-10-CM | POA: Diagnosis not present

## 2022-10-09 DIAGNOSIS — N1831 Chronic kidney disease, stage 3a: Secondary | ICD-10-CM | POA: Diagnosis not present

## 2022-10-09 DIAGNOSIS — N183 Chronic kidney disease, stage 3 unspecified: Secondary | ICD-10-CM | POA: Diagnosis not present

## 2022-10-09 DIAGNOSIS — E538 Deficiency of other specified B group vitamins: Secondary | ICD-10-CM

## 2022-10-09 DIAGNOSIS — H6123 Impacted cerumen, bilateral: Secondary | ICD-10-CM

## 2022-10-09 DIAGNOSIS — I700 Atherosclerosis of aorta: Secondary | ICD-10-CM | POA: Diagnosis not present

## 2022-10-09 DIAGNOSIS — E519 Thiamine deficiency, unspecified: Secondary | ICD-10-CM | POA: Diagnosis not present

## 2022-10-09 NOTE — Patient Instructions (Signed)
[facility]- Lung Cancer Screen: [phone]

## 2022-10-10 LAB — MICROALBUMIN / CREATININE URINE RATIO: Microalb, Ur: 2.1 mg/dL

## 2022-10-13 LAB — CBC WITH DIFFERENTIAL/PLATELET
Absolute Monocytes: 288 cells/uL (ref 200–950)
Basophils Absolute: 30 cells/uL (ref 0–200)
Basophils Relative: 0.7 %
Eosinophils Absolute: 159 cells/uL (ref 15–500)
Eosinophils Relative: 3.7 %
HCT: 36.5 % — ABNORMAL LOW (ref 38.5–50.0)
Hemoglobin: 11.9 g/dL — ABNORMAL LOW (ref 13.2–17.1)
Lymphs Abs: 1320 cells/uL (ref 850–3900)
MCH: 30.7 pg (ref 27.0–33.0)
MCHC: 32.6 g/dL (ref 32.0–36.0)
MCV: 94.1 fL (ref 80.0–100.0)
MPV: 10.3 fL (ref 7.5–12.5)
Monocytes Relative: 6.7 %
Neutro Abs: 2503 cells/uL (ref 1500–7800)
Neutrophils Relative %: 58.2 %
Platelets: 233 10*3/uL (ref 140–400)
RBC: 3.88 10*6/uL — ABNORMAL LOW (ref 4.20–5.80)
RDW: 11.7 % (ref 11.0–15.0)
Total Lymphocyte: 30.7 %
WBC: 4.3 10*3/uL (ref 3.8–10.8)

## 2022-10-13 LAB — COMPLETE METABOLIC PANEL WITH GFR
AG Ratio: 1.6 (calc) (ref 1.0–2.5)
ALT: 13 U/L (ref 9–46)
AST: 17 U/L (ref 10–35)
Albumin: 4.4 g/dL (ref 3.6–5.1)
Alkaline phosphatase (APISO): 72 U/L (ref 35–144)
BUN/Creatinine Ratio: 17 (calc) (ref 6–22)
BUN: 25 mg/dL (ref 7–25)
CO2: 26 mmol/L (ref 20–32)
Calcium: 9.5 mg/dL (ref 8.6–10.3)
Chloride: 107 mmol/L (ref 98–110)
Creat: 1.48 mg/dL — ABNORMAL HIGH (ref 0.70–1.35)
Globulin: 2.7 g/dL (calc) (ref 1.9–3.7)
Glucose, Bld: 86 mg/dL (ref 65–99)
Potassium: 5.3 mmol/L (ref 3.5–5.3)
Sodium: 143 mmol/L (ref 135–146)
Total Bilirubin: 0.3 mg/dL (ref 0.2–1.2)
Total Protein: 7.1 g/dL (ref 6.1–8.1)
eGFR: 52 mL/min/{1.73_m2} — ABNORMAL LOW (ref >=60)

## 2022-10-13 LAB — VITAMIN D 25 HYDROXY (VIT D DEFICIENCY, FRACTURES): Vit D, 25-Hydroxy: 52 ng/mL (ref 30–100)

## 2022-10-13 LAB — IRON,TIBC AND FERRITIN PANEL
%SAT: 41 % (calc) (ref 20–48)
Ferritin: 55 ng/mL (ref 24–380)
Iron: 112 ug/dL (ref 50–180)
TIBC: 276 mcg/dL (calc) (ref 250–425)

## 2022-10-13 LAB — LIPID PANEL
Cholesterol: 190 mg/dL (ref <200)
HDL: 57 mg/dL (ref >=40)
LDL Cholesterol (Calc): 116 mg/dL (calc) — ABNORMAL HIGH
Non-HDL Cholesterol (Calc): 133 mg/dL (calc) — ABNORMAL HIGH (ref <130)
Total CHOL/HDL Ratio: 3.3 (calc) (ref <5.0)
Triglycerides: 71 mg/dL (ref <150)

## 2022-10-13 LAB — MICROALBUMIN / CREATININE URINE RATIO
Creatinine, Urine: 33 mg/dL (ref 20–320)
Microalb Creat Ratio: 64 mg/g creat — ABNORMAL HIGH (ref <30)

## 2022-10-13 LAB — VITAMIN B1: Vitamin B1 (Thiamine): 8 nmol/L (ref 8–30)

## 2022-10-13 LAB — VITAMIN B12: Vitamin B-12: 339 pg/mL (ref 200–1100)

## 2022-10-14 ENCOUNTER — Telehealth: Payer: Self-pay

## 2022-10-14 NOTE — Telephone Encounter (Signed)
Copied from CRM [id number]. Topic: General - Other >> Oct 14, 2022 11:03 AM Epimenio Foot F wrote: Reason for CRM: Pt is returning a call from the practice.    Unsure who called

## 2022-10-14 NOTE — Progress Notes (Signed)
   Care Guide Note  10/14/2022 Name: Ryan [name] MRN: [id number] DOB: [birth year]  Referred by: Alba Cory, MD Reason for referral : Care Coordination (Outreach to schedule with Pharm d )   Ryan [name] is a 67 y.o. year old male who is a primary care patient of Alba Cory, MD. Ryan [name] was referred to the pharmacist for assistance related to CKD Stage 3 .    A third unsuccessful telephone outreach was attempted today to contact the patient who was referred to the pharmacy team for assistance with medication assistance. The Population Health team is pleased to engage with this patient at any time in the future upon receipt of referral and should he/she be interested in assistance from the [employer] team.   Penne Lash, RMA Care Guide [facility]  [place], [place] [address] Direct Dial: [phone] [email or web address]

## 2022-10-15 ENCOUNTER — Telehealth: Payer: Self-pay

## 2022-10-15 ENCOUNTER — Other Ambulatory Visit: Payer: Self-pay

## 2022-10-15 DIAGNOSIS — Z86010 Personal history of colonic polyps: Secondary | ICD-10-CM

## 2022-10-15 MED ORDER — NA SULFATE-K SULFATE-MG SULF 17.5-3.13-1.6 GM/177ML PO SOLN: 1.0000 | Freq: Once | ORAL | 0 refills | Status: AC

## 2022-10-15 NOTE — Telephone Encounter (Signed)
Pt called to schedule colonoscopy  please return call 

## 2022-10-15 NOTE — Addendum Note (Signed)
Addended by: Avie Arenas on: 10/15/2022 03:07 PM   Modules accepted: Orders

## 2022-10-15 NOTE — Telephone Encounter (Signed)
Gastroenterology Pre-Procedure Review  Request Date: 11/05/22 Requesting Physician: Dr. Servando Snare  PATIENT REVIEW QUESTIONS: The patient responded to the following health history questions as indicated:    1. Are you having any GI issues? no 2. Do you have a personal history of Polyps? Yes colonoscopy last performed by Dr. Servando Snare 02/15/16 3. Do you have a family history of Colon Cancer or Polyps? no 4. Diabetes Mellitus? no 5. Joint replacements in the past 12 months?no 6. Major health problems in the past 3 months?no 7. Any artificial heart valves, MVP, or defibrillator?no    MEDICATIONS & ALLERGIES:    Patient reports the following regarding taking any anticoagulation/antiplatelet therapy:   Plavix, Coumadin, Eliquis, Xarelto, Lovenox, Pradaxa, Brilinta, or Effient? no Aspirin? no  Patient confirms/reports the following medications:  Current Outpatient Medications  Medication Sig Dispense Refill   albuterol (VENTOLIN HFA) 108 (90 Base) MCG/ACT inhaler Inhale 2 puffs into the lungs every 6 (six) hours as needed for wheezing or shortness of breath. 18 g 0   ascorbic acid (VITAMIN C) 500 MG tablet Take 1 tablet (500 mg total) by mouth daily. 30 tablet 0   aspirin EC 81 MG tablet Take 1 tablet (81 mg total) by mouth daily. 90 tablet 3   atorvastatin (LIPITOR) 40 MG tablet Take 1 tablet (40 mg total) by mouth daily. 90 tablet 1   Cholecalciferol (DIALYVITE VITAMIN D 5000) 125 MCG (5000 UT) capsule Take 5,000 Units by mouth daily.     Fluticasone-Umeclidin-Vilant (TRELEGY ELLIPTA) 100-62.5-25 MCG/ACT AEPB Inhale 1 puff into the lungs daily. 3 each 1   gabapentin (NEURONTIN) 100 MG capsule Take 1 capsule (100 mg total) by mouth 3 (three) times daily. 270 capsule 1   Multiple Vitamin (MULTIVITAMIN WITH MINERALS) TABS tablet Take 1 tablet by mouth daily.     amLODipine-valsartan (EXFORGE) 5-160 MG tablet Take 1 tablet by mouth daily. (Patient not taking: Reported on 10/15/2022) 90 tablet 1    clopidogrel (PLAVIX) 75 MG tablet Take 1 tablet (75 mg total) by mouth daily. (Patient not taking: Reported on 10/15/2022) 90 tablet 1   DULoxetine (CYMBALTA) 60 MG capsule Take 1 capsule (60 mg total) by mouth daily. (Patient not taking: Reported on 10/15/2022) 90 capsule 1   pantoprazole (PROTONIX) 20 MG tablet Take 1 tablet (20 mg total) by mouth daily. 90 tablet 1   tadalafil (CIALIS) 20 MG tablet Take 0.5-1 tablets (10-20 mg total) by mouth every other day as needed for erectile dysfunction. 10 tablet 2   traZODone (DESYREL) 100 MG tablet Take 1 tablet (100 mg total) by mouth at bedtime. 90 tablet 1   No current facility-administered medications for this visit.    Patient confirms/reports the following allergies:  No Known Allergies  No orders of the defined types were placed in this encounter.   AUTHORIZATION INFORMATION Primary Insurance: 1D#: Group #:  Secondary Insurance: 1D#: Group #:  SCHEDULE INFORMATION: Date: 11/05/22 Time: Location: [facility]

## 2022-10-18 ENCOUNTER — Other Ambulatory Visit: Payer: Self-pay | Admitting: *Deleted

## 2022-10-18 DIAGNOSIS — I739 Peripheral vascular disease, unspecified: Secondary | ICD-10-CM

## 2022-10-23 ENCOUNTER — Ambulatory Visit (HOSPITAL_COMMUNITY)
Admission: RE | Admit: 2022-10-23 | Discharge: 2022-10-23 | Disposition: A | Discharge status: Home / Self Care | Payer: 59 | Source: Ambulatory Visit | Attending: Vascular Surgery | Admitting: Vascular Surgery

## 2022-10-23 ENCOUNTER — Ambulatory Visit (INDEPENDENT_AMBULATORY_CARE_PROVIDER_SITE_OTHER): Payer: 59 | Admitting: Physician Assistant

## 2022-10-23 VITALS — BP 84/59 | HR 84 | Temp 97.6°F | Resp 20 | Ht 68.0 in | Wt 123.0 lb

## 2022-10-23 DIAGNOSIS — I739 Peripheral vascular disease, unspecified: Secondary | ICD-10-CM | POA: Diagnosis not present

## 2022-10-23 LAB — VAS US ABI WITH/WO TBI: Left ABI: 0.77

## 2022-10-23 NOTE — Progress Notes (Signed)
Office Note   History of Present Illness   Ryan [name] is a 67 y.o. (1956/05/02) male who presents for surveillance of PAD.  He has a history of right below-knee amputation by Dr. Randie Heinz on 05/09/2020.  Prior to this the patient had an extensive history of endovascular interventions on the right lower extremity, including right external and common iliac artery stenting.  He also has a history of right fem-AT bypass that occluded. In October 2021 he also had left external iliac artery stenting by Dr. Randie Heinz.  He presents today for follow-up.  He is still ambulatory with a right below-knee prosthetic.  He denies any rest pain, claudication, nonhealing wounds.  His right BKA has no wounds.  Current Outpatient Medications  Medication Sig Dispense Refill   albuterol (VENTOLIN HFA) 108 (90 Base) MCG/ACT inhaler Inhale 2 puffs into the lungs every 6 (six) hours as needed for wheezing or shortness of breath. 18 g 0   amLODipine-valsartan (EXFORGE) 5-160 MG tablet Take 1 tablet by mouth daily. 90 tablet 1   ascorbic acid (VITAMIN C) 500 MG tablet Take 1 tablet (500 mg total) by mouth daily. 30 tablet 0   aspirin EC 81 MG tablet Take 1 tablet (81 mg total) by mouth daily. 90 tablet 3   atorvastatin (LIPITOR) 40 MG tablet Take 1 tablet (40 mg total) by mouth daily. 90 tablet 1   Cholecalciferol (DIALYVITE VITAMIN D 5000) 125 MCG (5000 UT) capsule Take 5,000 Units by mouth daily.     DULoxetine (CYMBALTA) 60 MG capsule Take 1 capsule (60 mg total) by mouth daily. 90 capsule 1   Fluticasone-Umeclidin-Vilant (TRELEGY ELLIPTA) 100-62.5-25 MCG/ACT AEPB Inhale 1 puff into the lungs daily. 3 each 1   gabapentin (NEURONTIN) 100 MG capsule Take 1 capsule (100 mg total) by mouth 3 (three) times daily. 270 capsule 1   Multiple Vitamin (MULTIVITAMIN WITH MINERALS) TABS tablet Take 1 tablet by mouth daily.     pantoprazole (PROTONIX) 20 MG tablet Take 1 tablet (20 mg total) by mouth daily. 90 tablet 1    tadalafil (CIALIS) 20 MG tablet Take 0.5-1 tablets (10-20 mg total) by mouth every other day as needed for erectile dysfunction. 10 tablet 2   traZODone (DESYREL) 100 MG tablet Take 1 tablet (100 mg total) by mouth at bedtime. 90 tablet 1   clopidogrel (PLAVIX) 75 MG tablet Take 1 tablet (75 mg total) by mouth daily. (Patient not taking: Reported on 10/23/2022) 90 tablet 1   No current facility-administered medications for this visit.    REVIEW OF SYSTEMS (negative unless checked):   Cardiac:  []  Chest pain or chest pressure? []  Shortness of breath upon activity? []  Shortness of breath when lying flat? []  Irregular heart rhythm?  Vascular:  []  Pain in calf, thigh, or hip brought on by walking? []  Pain in feet at night that wakes you up from your sleep? []  Blood clot in your veins? []  Leg swelling?  Pulmonary:  []  Oxygen at home? []  Productive cough? []  Wheezing?  Neurologic:  []  Sudden weakness in arms or legs? []  Sudden numbness in arms or legs? []  Sudden onset of difficult speaking or slurred speech? []  Temporary loss of vision in one eye? []  Problems with dizziness?  Gastrointestinal:  []  Blood in stool? []  Vomited blood?  Genitourinary:  []  Burning when urinating? []  Blood in urine?  Psychiatric:  []  Major depression  Hematologic:  []  Bleeding problems? []  Problems with blood clotting?  Dermatologic:  []   Rashes or ulcers?  Constitutional:  []  Fever or chills?  Ear/Nose/Throat:  []  Change in hearing? []  Nose bleeds? []  Sore throat?  Musculoskeletal:  []  Back pain? []  Joint pain? []  Muscle pain?   Physical Examination   Vitals:   10/23/22 1503  BP: (!) 84/59  Pulse: 84  Resp: 20  Temp: 97.6 F (36.4 C)  TempSrc: Temporal  SpO2: 97%  Weight: 123 lb (55.8 kg)  Height: 5\' 8"  (1.727 m)   Body mass index is 18.7 kg/m.  General:  WDWN in NAD; vital signs documented above Gait: Not observed HENT: WNL, normocephalic Pulmonary: normal  non-labored breathing Cardiac: Regular Abdomen: soft, NT, no masses Skin: without rashes Vascular Exam/Pulses: Palpable left PT Extremities: Healed right BKA without wounds.  Left lower extremity without gangrene, cellulitis, wounds. Musculoskeletal: no muscle wasting or atrophy  Neurologic: A&O X 3;  No focal weakness or paresthesias are detected Psychiatric:  The pt has Normal affect.  Non-Invasive Vascular imaging   ABI (10/23/2022) Right BKA   L:  ABI: 0.77 (1.04),  PT: bi DP: bi TBI: 0.44   Medical Decision Making   GEOVANY [name] is a 67 y.o. male who presents for surveillance of PAD  Based on the patient's vascular studies, his ABIs have decreased in the left.  His ABIs decreased from 1.07 to 0.77.  He has biphasic flow in his tibial vessels He has a history of right BKA which is still healed.  He denies any rest pain, claudication, or nonhealing wounds of the left lower extremity. He has palpable femoral pulses bilaterally.  He also has a palpable left PT He has a history of right common iliac, right external iliac artery, and left external iliac artery stenting however his stents were not visualized today.  We will study these at his next visit Although the patient's ABIs are decreased on the left, he is asymptomatic and has a palpable femoral and PT pulse. We can follow-up with the patient in 1 year with repeat ABIs and bilateral aortoiliac duplex study.   Ryan Dubonnet PA-C [facility] Office: [phone]  Clinic MD: Randie Heinz

## 2022-10-30 ENCOUNTER — Ambulatory Visit: Payer: 59 | Admitting: Family Medicine

## 2022-11-01 ENCOUNTER — Other Ambulatory Visit: Payer: Self-pay

## 2022-11-01 DIAGNOSIS — I739 Peripheral vascular disease, unspecified: Secondary | ICD-10-CM

## 2022-11-01 DIAGNOSIS — I70229 Atherosclerosis of native arteries of extremities with rest pain, unspecified extremity: Secondary | ICD-10-CM

## 2022-11-04 ENCOUNTER — Telehealth: Payer: Self-pay

## 2022-11-04 DIAGNOSIS — Z86010 Personal history of colonic polyps: Secondary | ICD-10-CM

## 2022-11-04 NOTE — Progress Notes (Deleted)
Patient: Ryan [name], Male    DOB: 27-Feb-1956, 67 y.o.   MRN: [id number]  Visit Date: 11/04/2022  Today's Provider: Ruel Favors, MD   Welcome to Medicare  Subjective:   Ryan [name] is a 67 y.o. male who presents today for his Subsequent Annual Wellness Visit.  Patient/Caregiver input:    HPI  IPSS Questionnaire (AUA-7): Over the past month.   1)  How often have you had a sensation of not emptying your bladder completely after you finish urinating?  {Rating:19227}  2)  How often have you had to urinate again less than two hours after you finished urinating? {Rating:19227}  3)  How often have you found you stopped and started again several times when you urinated?  {Rating:19227}  4) How difficult have you found it to postpone urination?  {Rating:19227}  5) How often have you had a weak urinary stream?  {Rating:19227}  6) How often have you had to push or strain to begin urination?  {Rating:19227}  7) How many times did you most typically get up to urinate from the time you went to bed until the time you got up in the morning?  {Rating:19228}  Total score:  0-7 mildly symptomatic   8-19 moderately symptomatic   20-35 severely symptomatic     Review of Systems Constitutional: Negative for fever or weight change.  Respiratory: Negative for cough and shortness of breath.   Cardiovascular: Negative for chest pain or palpitations.  Gastrointestinal: Negative for abdominal pain, no bowel changes.  Musculoskeletal: Negative for gait problem or joint swelling.  Skin: Negative for rash.  Neurological: Negative for dizziness or headache.  No other specific complaints in a complete review of systems (except as listed in HPI above).  Past Medical History:  Diagnosis Date   CKD (chronic kidney disease)    History of blood transfusion    Hyperlipidemia    Hypertension    Neuromuscular disorder (HCC)    numbness feet   Peripheral vascular disease (HCC)    Shortness of  breath dyspnea    occasional     Past Surgical History:  Procedure Laterality Date   ABDOMINAL AORTOGRAM W/LOWER EXTREMITY N/A 09/13/2019   Procedure: ABDOMINAL AORTOGRAM W/LOWER EXTREMITY;  Surgeon: Maeola Harman, MD;  Location: [facility] INVASIVE CV LAB;  Service: Cardiovascular;  Laterality: N/A;   AMPUTATION Right 05/09/2020   Procedure: RIGHT BELOW KNEE AMPUTATION;  Surgeon: Maeola Harman, MD;  Location: [facility] OR;  Service: Vascular;  Laterality: Right;  w/ a block   COLONOSCOPY WITH PROPOFOL N/A 02/15/2016   Procedure: COLONOSCOPY WITH PROPOFOL;  Surgeon: [name], MD;  Location: [facility] SURGERY [facility];  Service: Endoscopy;  Laterality: N/A;   FEMORAL-POPLITEAL BYPASS GRAFT Right 09/14/2019   Procedure: BYPASS GRAFT FEMORAL-POPLITEAL ARTERY;  Surgeon: Maeola Harman, MD;  Location: [facility] OR;  Service: Vascular;  Laterality: Right;   HERNIA REPAIR  1999   left inguinal   INSERTION OF ILIAC STENT Right 09/14/2019   Procedure: Insertion Of Common and External Iliac Stent;  Surgeon: Maeola Harman, MD;  Location: [facility] OR;  Service: Vascular;  Laterality: Right;   LOWER EXTREMITY ANGIOGRAPHY Right 05/07/2018   Procedure: LOWER EXTREMITY ANGIOGRAPHY;  Surgeon: Annice Needy, MD;  Location: [facility] INVASIVE CV LAB;  Service: Cardiovascular;  Laterality: Right;   LOWER EXTREMITY ANGIOGRAPHY Right 06/25/2018   Procedure: LOWER EXTREMITY ANGIOGRAPHY;  Surgeon: Annice Needy, MD;  Location: [facility] INVASIVE CV LAB;  Service: Cardiovascular;  Laterality: Right;  LOWER EXTREMITY ANGIOGRAPHY Right 07/01/2018   Procedure: LOWER EXTREMITY ANGIOGRAPHY;  Surgeon: Annice Needy, MD;  Location: [facility] INVASIVE CV LAB;  Service: Cardiovascular;  Laterality: Right;   LOWER EXTREMITY ANGIOGRAPHY Right 08/12/2018   Procedure: LOWER EXTREMITY ANGIOGRAPHY;  Surgeon: Annice Needy, MD;  Location: [facility] INVASIVE CV LAB;  Service: Cardiovascular;  Laterality: Right;   LOWER EXTREMITY ANGIOGRAPHY Right  09/03/2018   Procedure: LOWER EXTREMITY ANGIOGRAPHY;  Surgeon: Annice Needy, MD;  Location: [facility] INVASIVE CV LAB;  Service: Cardiovascular;  Laterality: Right;   LOWER EXTREMITY ANGIOGRAPHY Right 09/04/2018   Procedure: Lower Extremity Angiography;  Surgeon: Annice Needy, MD;  Location: [facility] INVASIVE CV LAB;  Service: Cardiovascular;  Laterality: Right;   LOWER EXTREMITY ANGIOGRAPHY Right 10/01/2018   Procedure: LOWER EXTREMITY ANGIOGRAPHY;  Surgeon: Annice Needy, MD;  Location: [facility] INVASIVE CV LAB;  Service: Cardiovascular;  Laterality: Right;   LOWER EXTREMITY ANGIOGRAPHY Right 10/01/2018   Procedure: Lower Extremity Angiography;  Surgeon: Annice Needy, MD;  Location: [facility] INVASIVE CV LAB;  Service: Cardiovascular;  Laterality: Right;   LOWER EXTREMITY ANGIOGRAPHY Right 10/15/2018   Procedure: LOWER EXTREMITY ANGIOGRAPHY;  Surgeon: Annice Needy, MD;  Location: [facility] INVASIVE CV LAB;  Service: Cardiovascular;  Laterality: Right;   LOWER EXTREMITY ANGIOGRAPHY Left 10/16/2018   Procedure: Lower Extremity Angiography;  Surgeon: Annice Needy, MD;  Location: [facility] INVASIVE CV LAB;  Service: Cardiovascular;  Laterality: Left;   LOWER EXTREMITY ANGIOGRAPHY Left 01/20/2019   Procedure: LOWER EXTREMITY ANGIOGRAPHY;  Surgeon: Annice Needy, MD;  Location: [facility] INVASIVE CV LAB;  Service: Cardiovascular;  Laterality: Left;   LOWER EXTREMITY ANGIOGRAPHY Right 01/21/2019   Procedure: Lower Extremity Angiography;  Surgeon: Annice Needy, MD;  Location: [facility] INVASIVE CV LAB;  Service: Cardiovascular;  Laterality: Right;   LOWER EXTREMITY ANGIOGRAPHY Right 01/28/2019   Procedure: LOWER EXTREMITY ANGIOGRAPHY;  Surgeon: Annice Needy, MD;  Location: [facility] INVASIVE CV LAB;  Service: Cardiovascular;  Laterality: Right;   LOWER EXTREMITY ANGIOGRAPHY Right 01/29/2019   Procedure: Lower Extremity Angiography;  Surgeon: Annice Needy, MD;  Location: [facility] INVASIVE CV LAB;  Service: Cardiovascular;  Laterality: Right;   LOWER EXTREMITY  ANGIOGRAPHY Right 04/04/2020   Procedure: LOWER EXTREMITY ANGIOGRAPHY;  Surgeon: Maeola Harman, MD;  Location: [facility] INVASIVE CV LAB;  Service: Cardiovascular;  Laterality: Right;   LOWER EXTREMITY INTERVENTION Right 07/02/2018   Procedure: LOWER EXTREMITY INTERVENTION;  Surgeon: Annice Needy, MD;  Location: [facility] INVASIVE CV LAB;  Service: Cardiovascular;  Laterality: Right;   PERIPHERAL VASCULAR BALLOON ANGIOPLASTY Left 04/04/2020   Procedure: PERIPHERAL VASCULAR BALLOON ANGIOPLASTY;  Surgeon: Maeola Harman, MD;  Location: [facility] INVASIVE CV LAB;  Service: Cardiovascular;  Laterality: Left;  external iliac   POLYPECTOMY N/A 02/15/2016   Procedure: POLYPECTOMY;  Surgeon: [name], MD;  Location: [facility];  Service: Endoscopy;  Laterality: N/A;    Family History  Problem Relation Age of Onset   COPD Mother    Hypertension Father    Heart attack Brother     Social History   Socioeconomic History   Marital status: Single    Spouse name: [name]   Number of children: 5   Years of education: Not on file   Highest education level: High school graduate  Occupational History   Occupation: Forklift    Comment: [employer]  Tobacco Use   Smoking status: Every Day    Packs/day: 0.50    Years: 40.00    Additional  pack years: 0.00    Total pack years: 20.00    Types: Cigarettes    Start date: 07/21/1978    Last attempt to quit: 12/2018    Years since quitting: 3.8    Passive exposure: Never   Smokeless tobacco: Never   Tobacco comments:    Started using cocaine recently but states he will try to quit   Vaping Use   Vaping Use: Never used  Substance and Sexual Activity   Alcohol use: Not Currently    Alcohol/week: 12.0 standard drinks of alcohol    Types: 12 Cans of beer per week    Comment: 4 beers / week now per pt 05/05/20   Drug use: Yes    Frequency: 2.0 times per week    Types: Cocaine   Sexual activity: Yes    Partners: Female    Birth  control/protection: None  Other Topics Concern   Not on file  Social History Narrative   Not on file   Social Determinants of Health   Financial Resource Strain: Low Risk  (03/12/2018)   Overall Financial Resource Strain (CARDIA)    Difficulty of Paying Living Expenses: Not hard at all  Food Insecurity: No Food Insecurity (03/12/2018)   Hunger Vital Sign    Worried About Running Out of Food in the Last Year: Never true    Ran Out of Food in the Last Year: Never true  Transportation Needs: No Transportation Needs (03/12/2018)   PRAPARE - Administrator, Civil Service (Medical): No    Lack of Transportation (Non-Medical): No  Physical Activity: Sufficiently Active (06/08/2018)   Exercise Vital Sign    Days of Exercise per Week: 6 days    Minutes of Exercise per Session: 60 min  Recent Concern: Physical Activity - Inactive (03/12/2018)   Exercise Vital Sign    Days of Exercise per Week: 0 days    Minutes of Exercise per Session: 0 min  Stress: No Stress Concern Present (03/12/2018)   [employer] of Occupational Health - Occupational Stress Questionnaire    Feeling of Stress : Not at all  Social Connections: Somewhat Isolated (03/12/2018)   Social Connection and Isolation Panel [NHANES]    Frequency of Communication with Friends and Family: Three times a week    Frequency of Social Gatherings with Friends and Family: Twice a week    Attends Religious Services: Never    Database administrator or Organizations: No    Attends Banker Meetings: Never    Marital Status: Married  Catering manager Violence: Not At Risk (03/12/2018)   Humiliation, Afraid, Rape, and Kick questionnaire    Fear of Current or Ex-Partner: No    Emotionally Abused: No    Physically Abused: No    Sexually Abused: No    Outpatient Encounter Medications as of 11/05/2022  Medication Sig   albuterol (VENTOLIN HFA) 108 (90 Base) MCG/ACT inhaler Inhale 2 puffs into the lungs every 6 (six)  hours as needed for wheezing or shortness of breath.   amLODipine-valsartan (EXFORGE) 5-160 MG tablet Take 1 tablet by mouth daily.   ascorbic acid (VITAMIN C) 500 MG tablet Take 1 tablet (500 mg total) by mouth daily.   aspirin EC 81 MG tablet Take 1 tablet (81 mg total) by mouth daily.   atorvastatin (LIPITOR) 40 MG tablet Take 1 tablet (40 mg total) by mouth daily.   Cholecalciferol (DIALYVITE VITAMIN D 5000) 125 MCG (5000 UT) capsule Take 5,000 Units  by mouth daily.   clopidogrel (PLAVIX) 75 MG tablet Take 1 tablet (75 mg total) by mouth daily. (Patient not taking: Reported on 10/23/2022)   DULoxetine (CYMBALTA) 60 MG capsule Take 1 capsule (60 mg total) by mouth daily.   Fluticasone-Umeclidin-Vilant (TRELEGY ELLIPTA) 100-62.5-25 MCG/ACT AEPB Inhale 1 puff into the lungs daily.   gabapentin (NEURONTIN) 100 MG capsule Take 1 capsule (100 mg total) by mouth 3 (three) times daily.   Multiple Vitamin (MULTIVITAMIN WITH MINERALS) TABS tablet Take 1 tablet by mouth daily.   pantoprazole (PROTONIX) 20 MG tablet Take 1 tablet (20 mg total) by mouth daily.   tadalafil (CIALIS) 20 MG tablet Take 0.5-1 tablets (10-20 mg total) by mouth every other day as needed for erectile dysfunction.   traZODone (DESYREL) 100 MG tablet Take 1 tablet (100 mg total) by mouth at bedtime.   No facility-administered encounter medications on file as of 11/05/2022.    No Known Allergies  Care Team Updated in EHR: {Yes/No:30480221}  Last Vision Exam: *** Wears corrective lenses: {Yes/No:30480221} Last Dental Exam: *** Last Hearing Exam: *** Wears Hearing Aids: {Yes/No:30480221}  Functional Ability / Safety Screening 1.  Was the timed Get Up and Go test longer than 30 seconds?  {Blank single:19197::"yes","no"} 2.  Does the patient need help with the phone, transportation, shopping,      preparing meals, housework, laundry, medications, or managing money?  {Blank single:19197::"yes","no"} 3.  Does the patient's home  have:  loose throw rugs in the hallway?   {Blank single:19197::"yes","no"}      Grab bars in the bathroom? {Blank single:19197::"yes","no"}      Handrails on the stairs?   {Blank single:19197::"yes","no"}      Poor lighting?   {Blank single:19197::"yes","no"} 4.  Has the patient noticed any hearing difficulties?   {Blank single:19197::"yes","no"}  Diet Recall and Exercise Regimen: ***  Fall Risk: *** See screening under Objective Information  Depression Screen: *** See screening under Objective Information  Advanced Directives: A voluntary discussion about advance care planning including the explanation and discussion of advance directives was discussed with the patient. Explanation about the health care proxy and living will was reviewed.  During this discussion, the patient was able to identify a health care proxy as *** and plans/does not plan to fill out the paperwork required and will bring this to our office to keep on file. Does patient have a HCPOA?    {Blank single:19197::"yes","no"} If yes, name and contact information:  Does patient have a living will or MOST form?  {Blank single:19197::"yes","no"}  Cancer Screenings: Skin: *** Lung: Low Dose CT Chest recommended if Age 37-80 years, 30 pack-year currently smoking OR have quit w/in 15years. Patient does not qualify.  Lifestyle risk factor issued reviewed: Diet, exercise, weight management, advised patient smoking is not healthy, nutrition/diet.   Prostate: *** Colon: 02/15/16  Additional Screenings: Hepatitis B/HIV/Syphillis: 10/22/21 Hepatitis C Screening: 08/31/15 Intimate Partner Violence: Negative AAA Screen: Men age 80 to 75 years if ever smoked recommended to get a one time AAA ultrasound screening exam. Patient does not qualify.  Objective:   Vitals: There were no vitals taken for this visit. There is no height or weight on file to calculate BMI.  Lab Results  Component Value Date   CHOL 190 10/09/2022   CHOL 141  10/22/2021   CHOL 132 01/17/2021   Lab Results  Component Value Date   HDL 57 10/09/2022   HDL 46 10/22/2021   HDL 46 01/17/2021   Lab Results  Component Value Date   LDLCALC 116 (H) 10/09/2022   LDLCALC 81 10/22/2021   LDLCALC 72 01/17/2021   Lab Results  Component Value Date   TRIG 71 10/09/2022   TRIG 61 10/22/2021   TRIG 59 01/17/2021   Lab Results  Component Value Date   CHOLHDL 3.3 10/09/2022   CHOLHDL 3.1 10/22/2021   CHOLHDL 2.9 01/17/2021   No results found for: "LDLDIRECT"  No results found.  Physical Exam Constitutional: Patient appears well-developed and well-nourished. Obese *** No distress.  HEENT: head atraumatic, normocephalic, pupils equal and reactive to light, ears ***, neck supple, throat within normal limits Cardiovascular: Normal rate, regular rhythm and normal heart sounds.  No murmur heard. No BLE edema. Pulmonary/Chest: Effort normal and breath sounds normal. No respiratory distress. Abdominal: Soft.  There is no tenderness. Psychiatric: Patient has a normal mood and affect. behavior is normal. Judgment and thought content normal.   Cognitive Testing - 6-CIT  Correct? Score   What year is it? {YES NO:22349} {Numbers; 0-4:31231} Yes = 0    No = 4  What month is it? {YES NO:22349} {Numbers; 0-4:31231} Yes = 0    No = 3  Remember:     Floyde Parkins, [address], [place]     What time is it? {YES NO:22349} {Numbers; 0-4:31231} Yes = 0    No = 3  Count backwards from 20 to 1 {YES NO:22349} {Numbers; 0-4:31231} Correct = 0    1 error = 2   More than 1 error = 4  Say the months of the year in reverse. {YES NO:22349} {Numbers; 0-4:31231} Correct = 0    1 error = 2   More than 1 error = 4  What address did I ask you to remember? {YES NO:22349} {NUMBERS; 0-10:5044} Correct = 0  1 error = 2    2 error = 4    3 error = 6    4 error = 8    All wrong = 10       TOTAL SCORE  {Numbers; 4-09:81191}/47   Interpretation:  {Desc;  normal/abnormal:11317::"Normal"}  Normal (0-7) Abnormal (8-28)   Fall Risk:    10/09/2022   10:54 AM 08/27/2022   11:13 AM 05/14/2022    2:56 PM 01/24/2022    9:04 AM 10/22/2021   10:28 AM  Fall Risk   Falls in the past year? 0 0 0 0 0  Number falls in past yr: 0  0 0 0  Injury with Fall? 0  0 0 0  Risk for fall due to : No Fall Risks No Fall Risks No Fall Risks No Fall Risks No Fall Risks  Follow up Falls prevention discussed Falls prevention discussed Falls prevention discussed Falls prevention discussed Falls prevention discussed    Depression Screen    10/09/2022   10:55 AM 08/27/2022   11:13 AM 05/14/2022    2:57 PM 01/24/2022    9:05 AM 10/22/2021   10:28 AM  Depression screen PHQ 2/9  Decreased Interest 0 0 0 0 0  Down, Depressed, Hopeless 0 0 0 0 0  PHQ - 2 Score 0 0 0 0 0  Altered sleeping 0 0 0 0 0  Tired, decreased energy 0 0 0 0 0  Change in appetite 0 3 0 0 0  Feeling bad or failure about yourself  0 0 0 0 0  Trouble concentrating 0 0 0 0 0  Moving slowly or fidgety/restless 0 0 0  0 0  Suicidal thoughts 0 0 0 0 0  PHQ-9 Score 0 3 0 0 0  Difficult doing work/chores  Not difficult at all        Assessment & Plan:     1. Welcome to Medicare preventive visit   *** type dotphrase "dot"diagmed to refresh this list  Exercise Activities and Dietary recommendations  Goals   None    Discussed health benefits of physical activity, and encouraged him to engage in regular exercise appropriate for his age and condition.   Immunization History  Administered Date(s) Administered   Influenza,inj,Quad PF,6+ Mos 03/12/2018, 04/09/2019, 03/14/2020, 05/21/2021, 08/27/2022   PFIZER(Purple Top)SARS-COV-2 Vaccination 10/02/2019, 10/26/2019   Pneumococcal Polysaccharide-23 03/12/2018   Tdap 01/17/2021   Zoster Recombinat (Shingrix) 07/13/2020, 01/17/2021    Health Maintenance  Topic Date Due   COLONOSCOPY (Pts 45-46yrs Insurance coverage will need to be confirmed)  02/14/2021    COVID-19 Vaccine (3 - 2023-24 season) 02/22/2022   Lung Cancer Screening  08/03/2022   Pneumonia Vaccine 78+ Years old (2 of 2 - PCV) 05/14/2023 (Originally 03/13/2019)   INFLUENZA VACCINE  01/23/2023   Medicare Annual Wellness (AWV)  11/05/2023   DTaP/Tdap/Td (2 - Td or Tdap) 01/18/2031   Hepatitis C Screening  Completed   HIV Screening  Completed   Zoster Vaccines- Shingrix  Completed   HPV VACCINES  Aged Out     No orders of the defined types were placed in this encounter.   Current Outpatient Medications:    albuterol (VENTOLIN HFA) 108 (90 Base) MCG/ACT inhaler, Inhale 2 puffs into the lungs every 6 (six) hours as needed for wheezing or shortness of breath., Disp: 18 g, Rfl: 0   amLODipine-valsartan (EXFORGE) 5-160 MG tablet, Take 1 tablet by mouth daily., Disp: 90 tablet, Rfl: 1   ascorbic acid (VITAMIN C) 500 MG tablet, Take 1 tablet (500 mg total) by mouth daily., Disp: 30 tablet, Rfl: 0   aspirin EC 81 MG tablet, Take 1 tablet (81 mg total) by mouth daily., Disp: 90 tablet, Rfl: 3   atorvastatin (LIPITOR) 40 MG tablet, Take 1 tablet (40 mg total) by mouth daily., Disp: 90 tablet, Rfl: 1   Cholecalciferol (DIALYVITE VITAMIN D 5000) 125 MCG (5000 UT) capsule, Take 5,000 Units by mouth daily., Disp: , Rfl:    clopidogrel (PLAVIX) 75 MG tablet, Take 1 tablet (75 mg total) by mouth daily. (Patient not taking: Reported on 10/23/2022), Disp: 90 tablet, Rfl: 1   DULoxetine (CYMBALTA) 60 MG capsule, Take 1 capsule (60 mg total) by mouth daily., Disp: 90 capsule, Rfl: 1   Fluticasone-Umeclidin-Vilant (TRELEGY ELLIPTA) 100-62.5-25 MCG/ACT AEPB, Inhale 1 puff into the lungs daily., Disp: 3 each, Rfl: 1   gabapentin (NEURONTIN) 100 MG capsule, Take 1 capsule (100 mg total) by mouth 3 (three) times daily., Disp: 270 capsule, Rfl: 1   Multiple Vitamin (MULTIVITAMIN WITH MINERALS) TABS tablet, Take 1 tablet by mouth daily., Disp: , Rfl:    pantoprazole (PROTONIX) 20 MG tablet, Take 1 tablet (20  mg total) by mouth daily., Disp: 90 tablet, Rfl: 1   tadalafil (CIALIS) 20 MG tablet, Take 0.5-1 tablets (10-20 mg total) by mouth every other day as needed for erectile dysfunction., Disp: 10 tablet, Rfl: 2   traZODone (DESYREL) 100 MG tablet, Take 1 tablet (100 mg total) by mouth at bedtime., Disp: 90 tablet, Rfl: 1 There are no discontinued medications.  I have personally reviewed and addressed the Medicare Annual Wellness health risk assessment questionnaire and  have noted the following in the patient's chart:  A.         Medical and social history & family history B.         Use of alcohol, tobacco or illicit drugs  C.         Current medications and supplements D.         Functional and Cognitive ability and status E.         Nutritional status F.         Physical activity G.        Advance directives H.         List of other physicians I.          Hospitalizations, surgeries, and ER visits in previous 12 months J.         Vitals K.         Screenings such as hearing and vision if needed, cognitive and depression L.         Referrals and appointments - ***  In addition, I have reviewed and discussed with patient certain preventive protocols, quality metrics, and best practice recommendations. A written personalized care plan for preventive services as well as general preventive health recommendations were provided to patient.   See attached scanned questionnaire for additional information.   No follow-ups on file.  *** refresh to show

## 2022-11-04 NOTE — Telephone Encounter (Signed)
Colonoscopy rescheduled from 11/05/22 to 06/25 due to patient stated he did not receive his instructions.  Vikki in Endo has been notified.  Thanks,  [place], [place]

## 2022-11-04 NOTE — Progress Notes (Unsigned)
Name: Ryan [name]   MRN: [id number]    DOB: 11/17/1955   Date:11/05/2022       Progress Note  Subjective  Chief Complaint  Follow Up  HPI  HTN/CKI stage III : He is on ARB.  He denies chest pain or palpitation, he denies pruritus and has a good urine output . His BP was very low during his visit March 2024, spiked during his visit 09/2022 but he has skipped bp medication that morning, went to vascular surgeon early May and bp was 80's/ 60's and it is low again today, we will adjust his bp medication Urine micro positive but his kidney function improved from low 30's one year ago to low 50's   History of Alcoholism:last B12 low again and he resumed taking it every other day, B1 also trending down, advised to take it once a week, he no longer drinks , since he had leg amputated back in 2021   Atherosclerosis of aorta: LDL at goal,  s/p amputation right leg due to PAD, on statin therapy He was recent seen by vascular surgeon and will go back in one year    Patient Active Problem List   Diagnosis Date Noted   Cocaine use disorder, mild, in early remission (HCC) 08/27/2022   History of alcoholism (HCC) 01/24/2022   History of amputation below knee, right (HCC) 01/24/2022   Moderate protein malnutrition (HCC) 01/24/2022   Benign hypertension with chronic kidney disease, stage III (HCC) 01/24/2022   Aneurysm of ascending aorta without rupture (HCC) 01/24/2022   Stage 3a chronic kidney disease (HCC) 01/24/2022   Phantom pain after amputation of lower extremity (HCC)    Below-knee amputation of right lower extremity (HCC) 05/17/2020   Iron deficiency anemia 11/23/2018   Stage 3 chronic kidney disease (HCC) 10/21/2018   Atherosclerosis of native arteries of extremity with rest pain (HCC) 10/13/2018   PAD (peripheral artery disease) (HCC) 07/21/2018   Claudication (HCC) 03/17/2018   B12 deficiency 03/12/2018   Vitamin B1 deficiency 03/12/2018   Varicose veins of leg with pain, right  03/12/2018   Enlarged thoracic aorta (HCC) 02/24/2018   Alcoholism /alcohol abuse 02/24/2018   Paresthesia 02/24/2018   Ectatic aorta (HCC) 02/20/2018   Emphysema lung (HCC) 02/20/2018   Atherosclerosis of aorta (HCC) 02/20/2018   Hypertension, benign 08/31/2015   Tobacco use 08/31/2015    Past Surgical History:  Procedure Laterality Date   ABDOMINAL AORTOGRAM W/LOWER EXTREMITY N/A 09/13/2019   Procedure: ABDOMINAL AORTOGRAM W/LOWER EXTREMITY;  Surgeon: Maeola Harman, MD;  Location: [facility] INVASIVE CV LAB;  Service: Cardiovascular;  Laterality: N/A;   AMPUTATION Right 05/09/2020   Procedure: RIGHT BELOW KNEE AMPUTATION;  Surgeon: Maeola Harman, MD;  Location: [facility] OR;  Service: Vascular;  Laterality: Right;  w/ a block   COLONOSCOPY WITH PROPOFOL N/A 02/15/2016   Procedure: COLONOSCOPY WITH PROPOFOL;  Surgeon: [name], MD;  Location: [facility];  Service: Endoscopy;  Laterality: N/A;   FEMORAL-POPLITEAL BYPASS GRAFT Right 09/14/2019   Procedure: BYPASS GRAFT FEMORAL-POPLITEAL ARTERY;  Surgeon: Maeola Harman, MD;  Location: [facility] OR;  Service: Vascular;  Laterality: Right;   HERNIA REPAIR  1999   left inguinal   INSERTION OF ILIAC STENT Right 09/14/2019   Procedure: Insertion Of Common and External Iliac Stent;  Surgeon: Maeola Harman, MD;  Location: [facility] OR;  Service: Vascular;  Laterality: Right;   LOWER EXTREMITY ANGIOGRAPHY Right 05/07/2018   Procedure: LOWER EXTREMITY ANGIOGRAPHY;  Surgeon: Annice Needy,  MD;  Location: [facility] INVASIVE CV LAB;  Service: Cardiovascular;  Laterality: Right;   LOWER EXTREMITY ANGIOGRAPHY Right 06/25/2018   Procedure: LOWER EXTREMITY ANGIOGRAPHY;  Surgeon: Annice Needy, MD;  Location: [facility] INVASIVE CV LAB;  Service: Cardiovascular;  Laterality: Right;   LOWER EXTREMITY ANGIOGRAPHY Right 07/01/2018   Procedure: LOWER EXTREMITY ANGIOGRAPHY;  Surgeon: Annice Needy, MD;  Location: [facility] INVASIVE CV LAB;  Service:  Cardiovascular;  Laterality: Right;   LOWER EXTREMITY ANGIOGRAPHY Right 08/12/2018   Procedure: LOWER EXTREMITY ANGIOGRAPHY;  Surgeon: Annice Needy, MD;  Location: [facility] INVASIVE CV LAB;  Service: Cardiovascular;  Laterality: Right;   LOWER EXTREMITY ANGIOGRAPHY Right 09/03/2018   Procedure: LOWER EXTREMITY ANGIOGRAPHY;  Surgeon: Annice Needy, MD;  Location: [facility] INVASIVE CV LAB;  Service: Cardiovascular;  Laterality: Right;   LOWER EXTREMITY ANGIOGRAPHY Right 09/04/2018   Procedure: Lower Extremity Angiography;  Surgeon: Annice Needy, MD;  Location: [facility] INVASIVE CV LAB;  Service: Cardiovascular;  Laterality: Right;   LOWER EXTREMITY ANGIOGRAPHY Right 10/01/2018   Procedure: LOWER EXTREMITY ANGIOGRAPHY;  Surgeon: Annice Needy, MD;  Location: [facility] INVASIVE CV LAB;  Service: Cardiovascular;  Laterality: Right;   LOWER EXTREMITY ANGIOGRAPHY Right 10/01/2018   Procedure: Lower Extremity Angiography;  Surgeon: Annice Needy, MD;  Location: [facility] INVASIVE CV LAB;  Service: Cardiovascular;  Laterality: Right;   LOWER EXTREMITY ANGIOGRAPHY Right 10/15/2018   Procedure: LOWER EXTREMITY ANGIOGRAPHY;  Surgeon: Annice Needy, MD;  Location: [facility] INVASIVE CV LAB;  Service: Cardiovascular;  Laterality: Right;   LOWER EXTREMITY ANGIOGRAPHY Left 10/16/2018   Procedure: Lower Extremity Angiography;  Surgeon: Annice Needy, MD;  Location: [facility] INVASIVE CV LAB;  Service: Cardiovascular;  Laterality: Left;   LOWER EXTREMITY ANGIOGRAPHY Left 01/20/2019   Procedure: LOWER EXTREMITY ANGIOGRAPHY;  Surgeon: Annice Needy, MD;  Location: [facility] INVASIVE CV LAB;  Service: Cardiovascular;  Laterality: Left;   LOWER EXTREMITY ANGIOGRAPHY Right 01/21/2019   Procedure: Lower Extremity Angiography;  Surgeon: Annice Needy, MD;  Location: [facility] INVASIVE CV LAB;  Service: Cardiovascular;  Laterality: Right;   LOWER EXTREMITY ANGIOGRAPHY Right 01/28/2019   Procedure: LOWER EXTREMITY ANGIOGRAPHY;  Surgeon: Annice Needy, MD;  Location: [facility] INVASIVE CV  LAB;  Service: Cardiovascular;  Laterality: Right;   LOWER EXTREMITY ANGIOGRAPHY Right 01/29/2019   Procedure: Lower Extremity Angiography;  Surgeon: Annice Needy, MD;  Location: [facility] INVASIVE CV LAB;  Service: Cardiovascular;  Laterality: Right;   LOWER EXTREMITY ANGIOGRAPHY Right 04/04/2020   Procedure: LOWER EXTREMITY ANGIOGRAPHY;  Surgeon: Maeola Harman, MD;  Location: [facility] INVASIVE CV LAB;  Service: Cardiovascular;  Laterality: Right;   LOWER EXTREMITY INTERVENTION Right 07/02/2018   Procedure: LOWER EXTREMITY INTERVENTION;  Surgeon: Annice Needy, MD;  Location: [facility] INVASIVE CV LAB;  Service: Cardiovascular;  Laterality: Right;   PERIPHERAL VASCULAR BALLOON ANGIOPLASTY Left 04/04/2020   Procedure: PERIPHERAL VASCULAR BALLOON ANGIOPLASTY;  Surgeon: Maeola Harman, MD;  Location: [facility] INVASIVE CV LAB;  Service: Cardiovascular;  Laterality: Left;  external iliac   POLYPECTOMY N/A 02/15/2016   Procedure: POLYPECTOMY;  Surgeon: [name], MD;  Location: [facility];  Service: Endoscopy;  Laterality: N/A;    Family History  Problem Relation Age of Onset   COPD Mother    Hypertension Father    Heart attack Brother     Social History   Tobacco Use   Smoking status: Every Day    Packs/day: 0.50    Years: 40.00    Additional pack years: 0.00  Total pack years: 20.00    Types: Cigarettes    Start date: 07/21/1978    Last attempt to quit: 12/2018    Years since quitting: 3.8    Passive exposure: Never   Smokeless tobacco: Never   Tobacco comments:    Started using cocaine recently but states he will try to quit   Substance Use Topics   Alcohol use: Not Currently    Alcohol/week: 12.0 standard drinks of alcohol    Types: 12 Cans of beer per week    Comment: 4 beers / week now per pt 05/05/20     Current Outpatient Medications:    albuterol (VENTOLIN HFA) 108 (90 Base) MCG/ACT inhaler, Inhale 2 puffs into the lungs every 6 (six) hours as needed for  wheezing or shortness of breath., Disp: 18 g, Rfl: 0   amLODipine-valsartan (EXFORGE) 5-160 MG tablet, Take 1 tablet by mouth daily., Disp: 90 tablet, Rfl: 1   ascorbic acid (VITAMIN C) 500 MG tablet, Take 1 tablet (500 mg total) by mouth daily., Disp: 30 tablet, Rfl: 0   aspirin EC 81 MG tablet, Take 1 tablet (81 mg total) by mouth daily., Disp: 90 tablet, Rfl: 3   atorvastatin (LIPITOR) 40 MG tablet, Take 1 tablet (40 mg total) by mouth daily., Disp: 90 tablet, Rfl: 1   Cholecalciferol (DIALYVITE VITAMIN D 5000) 125 MCG (5000 UT) capsule, Take 5,000 Units by mouth daily., Disp: , Rfl:    clopidogrel (PLAVIX) 75 MG tablet, Take 1 tablet (75 mg total) by mouth daily., Disp: 90 tablet, Rfl: 1   DULoxetine (CYMBALTA) 60 MG capsule, Take 1 capsule (60 mg total) by mouth daily., Disp: 90 capsule, Rfl: 1   Fluticasone-Umeclidin-Vilant (TRELEGY ELLIPTA) 100-62.5-25 MCG/ACT AEPB, Inhale 1 puff into the lungs daily., Disp: 3 each, Rfl: 1   gabapentin (NEURONTIN) 100 MG capsule, Take 1 capsule (100 mg total) by mouth 3 (three) times daily., Disp: 270 capsule, Rfl: 1   Multiple Vitamin (MULTIVITAMIN WITH MINERALS) TABS tablet, Take 1 tablet by mouth daily., Disp: , Rfl:    pantoprazole (PROTONIX) 20 MG tablet, Take 1 tablet (20 mg total) by mouth daily., Disp: 90 tablet, Rfl: 1   tadalafil (CIALIS) 20 MG tablet, Take 0.5-1 tablets (10-20 mg total) by mouth every other day as needed for erectile dysfunction., Disp: 10 tablet, Rfl: 2   traZODone (DESYREL) 100 MG tablet, Take 1 tablet (100 mg total) by mouth at bedtime., Disp: 90 tablet, Rfl: 1  No Known Allergies  I personally reviewed active problem list, medication list, allergies, family history, social history, health maintenance with the patient/caregiver today.   ROS  Ten systems reviewed and is negative except as mentioned in HPI   Objective  Vitals:   11/05/22 1107 11/05/22 1114  BP: 96/66 92/66  Pulse: 91   Resp: 16   SpO2: 95%   Weight:  126 lb (57.2 kg)   Height: 5\' 8"  (1.727 m)     Body mass index is 19.16 kg/m.  Physical Exam  Constitutional: Patient appears well-developed and malnourished  No distress.  HEENT: head atraumatic, normocephalic, pupils equal and reactive to light, neck supple Cardiovascular: Normal rate, regular rhythm and normal heart sounds.  No murmur heard. No BLE edema. Pulmonary/Chest: Effort normal and breath sounds normal. No respiratory distress. Abdominal: Soft.  There is no tenderness. Psychiatric: Patient has a normal mood and affect. behavior is normal. Judgment and thought content normal.   PHQ2/9:    11/05/2022   11:06 AM  10/09/2022   10:55 AM 08/27/2022   11:13 AM 05/14/2022    2:57 PM 01/24/2022    9:05 AM  Depression screen PHQ 2/9  Decreased Interest 0 0 0 0 0  Down, Depressed, Hopeless 0 0 0 0 0  PHQ - 2 Score 0 0 0 0 0  Altered sleeping 0 0 0 0 0  Tired, decreased energy 0 0 0 0 0  Change in appetite 0 0 3 0 0  Feeling bad or failure about yourself  0 0 0 0 0  Trouble concentrating 0 0 0 0 0  Moving slowly or fidgety/restless 0 0 0 0 0  Suicidal thoughts 0 0 0 0 0  PHQ-9 Score 0 0 3 0 0  Difficult doing work/chores   Not difficult at all      phq 9 is negative   Fall Risk:    11/05/2022   11:06 AM 10/09/2022   10:54 AM 08/27/2022   11:13 AM 05/14/2022    2:56 PM 01/24/2022    9:04 AM  Fall Risk   Falls in the past year? 0 0 0 0 0  Number falls in past yr: 0 0  0 0  Injury with Fall? 0 0  0 0  Risk for fall due to : No Fall Risks No Fall Risks No Fall Risks No Fall Risks No Fall Risks  Follow up Falls prevention discussed Falls prevention discussed Falls prevention discussed Falls prevention discussed Falls prevention discussed      Functional Status Survey: Is the patient deaf or have difficulty hearing?: No Does the patient have difficulty seeing, even when wearing glasses/contacts?: Yes Does the patient have difficulty concentrating, remembering, or making  decisions?: No Does the patient have difficulty walking or climbing stairs?: Yes Does the patient have difficulty dressing or bathing?: No Does the patient have difficulty doing errands alone such as visiting a doctor's office or shopping?: No    Assessment & Plan  1. Benign hypertension with chronic kidney disease, stage III (HCC)  - valsartan (DIOVAN) 160 MG tablet; Take 1 tablet (160 mg total) by mouth daily. In place of amlodipine valsartan 5/160 mg  Dispense: 30 tablet; Refill: 0  2. B12 deficiency   There are no diagnoses linked to this encounter.

## 2022-11-05 ENCOUNTER — Ambulatory Visit (INDEPENDENT_AMBULATORY_CARE_PROVIDER_SITE_OTHER): Payer: 59 | Admitting: Family Medicine

## 2022-11-05 ENCOUNTER — Encounter: Payer: Self-pay | Admitting: Family Medicine

## 2022-11-05 ENCOUNTER — Ambulatory Visit: Payer: 59 | Admitting: Family Medicine

## 2022-11-05 VITALS — BP 92/66 | HR 91 | Resp 16 | Ht 68.0 in | Wt 126.0 lb

## 2022-11-05 DIAGNOSIS — I129 Hypertensive chronic kidney disease with stage 1 through stage 4 chronic kidney disease, or unspecified chronic kidney disease: Secondary | ICD-10-CM | POA: Diagnosis not present

## 2022-11-05 DIAGNOSIS — E538 Deficiency of other specified B group vitamins: Secondary | ICD-10-CM

## 2022-11-05 DIAGNOSIS — Z0000 Encounter for general adult medical examination without abnormal findings: Secondary | ICD-10-CM

## 2022-11-05 DIAGNOSIS — N183 Chronic kidney disease, stage 3 unspecified: Secondary | ICD-10-CM | POA: Diagnosis not present

## 2022-11-05 MED ORDER — VALSARTAN 160 MG PO TABS
160.0000 mg | ORAL_TABLET | Freq: Every day | ORAL | 0 refills | Status: DC
Start: 2022-11-05 — End: 2022-12-12

## 2022-11-27 NOTE — Progress Notes (Deleted)
Patient: Ryan [name], Male    DOB: 1956/04/22, 67 y.o.   MRN: [id number]  Visit Date: 11/27/2022  Today's Provider: Ruel Favors, MD   Annual Wellness Visit  Subjective:   Ryan [name] is a 67 y.o. male who presents today for his Subsequent Annual Wellness Visit.  Patient/Caregiver input:    HPI  IPSS Questionnaire (AUA-7): Over the past month.   1)  How often have you had a sensation of not emptying your bladder completely after you finish urinating?  {Rating:19227}  2)  How often have you had to urinate again less than two hours after you finished urinating? {Rating:19227}  3)  How often have you found you stopped and started again several times when you urinated?  {Rating:19227}  4) How difficult have you found it to postpone urination?  {Rating:19227}  5) How often have you had a weak urinary stream?  {Rating:19227}  6) How often have you had to push or strain to begin urination?  {Rating:19227}  7) How many times did you most typically get up to urinate from the time you went to bed until the time you got up in the morning?  {Rating:19228}  Total score:  0-7 mildly symptomatic   8-19 moderately symptomatic   20-35 severely symptomatic     Review of Systems Constitutional: Negative for fever or weight change.  Respiratory: Negative for cough and shortness of breath.   Cardiovascular: Negative for chest pain or palpitations.  Gastrointestinal: Negative for abdominal pain, no bowel changes.  Musculoskeletal: Negative for gait problem or joint swelling.  Skin: Negative for rash.  Neurological: Negative for dizziness or headache.  No other specific complaints in a complete review of systems (except as listed in HPI above).  Past Medical History:  Diagnosis Date   CKD (chronic kidney disease)    History of blood transfusion    Hyperlipidemia    Hypertension    Neuromuscular disorder (HCC)    numbness feet   Peripheral vascular disease (HCC)    Shortness of  breath dyspnea    occasional     Past Surgical History:  Procedure Laterality Date   ABDOMINAL AORTOGRAM W/LOWER EXTREMITY N/A 09/13/2019   Procedure: ABDOMINAL AORTOGRAM W/LOWER EXTREMITY;  Surgeon: Maeola Harman, MD;  Location: [facility] INVASIVE CV LAB;  Service: Cardiovascular;  Laterality: N/A;   AMPUTATION Right 05/09/2020   Procedure: RIGHT BELOW KNEE AMPUTATION;  Surgeon: Maeola Harman, MD;  Location: [facility] OR;  Service: Vascular;  Laterality: Right;  w/ a block   COLONOSCOPY WITH PROPOFOL N/A 02/15/2016   Procedure: COLONOSCOPY WITH PROPOFOL;  Surgeon: [name], MD;  Location: [facility] SURGERY CNTR;  Service: Endoscopy;  Laterality: N/A;   FEMORAL-POPLITEAL BYPASS GRAFT Right 09/14/2019   Procedure: BYPASS GRAFT FEMORAL-POPLITEAL ARTERY;  Surgeon: Maeola Harman, MD;  Location: [facility] OR;  Service: Vascular;  Laterality: Right;   HERNIA REPAIR  1999   left inguinal   INSERTION OF ILIAC STENT Right 09/14/2019   Procedure: Insertion Of Common and External Iliac Stent;  Surgeon: Maeola Harman, MD;  Location: [facility] OR;  Service: Vascular;  Laterality: Right;   LOWER EXTREMITY ANGIOGRAPHY Right 05/07/2018   Procedure: LOWER EXTREMITY ANGIOGRAPHY;  Surgeon: Annice Needy, MD;  Location: [facility] INVASIVE CV LAB;  Service: Cardiovascular;  Laterality: Right;   LOWER EXTREMITY ANGIOGRAPHY Right 06/25/2018   Procedure: LOWER EXTREMITY ANGIOGRAPHY;  Surgeon: Annice Needy, MD;  Location: [facility] INVASIVE CV LAB;  Service: Cardiovascular;  Laterality: Right;  LOWER EXTREMITY ANGIOGRAPHY Right 07/01/2018   Procedure: LOWER EXTREMITY ANGIOGRAPHY;  Surgeon: Annice Needy, MD;  Location: [facility] INVASIVE CV LAB;  Service: Cardiovascular;  Laterality: Right;   LOWER EXTREMITY ANGIOGRAPHY Right 08/12/2018   Procedure: LOWER EXTREMITY ANGIOGRAPHY;  Surgeon: Annice Needy, MD;  Location: [facility] INVASIVE CV LAB;  Service: Cardiovascular;  Laterality: Right;   LOWER EXTREMITY ANGIOGRAPHY Right  09/03/2018   Procedure: LOWER EXTREMITY ANGIOGRAPHY;  Surgeon: Annice Needy, MD;  Location: [facility] INVASIVE CV LAB;  Service: Cardiovascular;  Laterality: Right;   LOWER EXTREMITY ANGIOGRAPHY Right 09/04/2018   Procedure: Lower Extremity Angiography;  Surgeon: Annice Needy, MD;  Location: [facility] INVASIVE CV LAB;  Service: Cardiovascular;  Laterality: Right;   LOWER EXTREMITY ANGIOGRAPHY Right 10/01/2018   Procedure: LOWER EXTREMITY ANGIOGRAPHY;  Surgeon: Annice Needy, MD;  Location: [facility] INVASIVE CV LAB;  Service: Cardiovascular;  Laterality: Right;   LOWER EXTREMITY ANGIOGRAPHY Right 10/01/2018   Procedure: Lower Extremity Angiography;  Surgeon: Annice Needy, MD;  Location: [facility] INVASIVE CV LAB;  Service: Cardiovascular;  Laterality: Right;   LOWER EXTREMITY ANGIOGRAPHY Right 10/15/2018   Procedure: LOWER EXTREMITY ANGIOGRAPHY;  Surgeon: Annice Needy, MD;  Location: [facility] INVASIVE CV LAB;  Service: Cardiovascular;  Laterality: Right;   LOWER EXTREMITY ANGIOGRAPHY Left 10/16/2018   Procedure: Lower Extremity Angiography;  Surgeon: Annice Needy, MD;  Location: [facility] INVASIVE CV LAB;  Service: Cardiovascular;  Laterality: Left;   LOWER EXTREMITY ANGIOGRAPHY Left 01/20/2019   Procedure: LOWER EXTREMITY ANGIOGRAPHY;  Surgeon: Annice Needy, MD;  Location: [facility] INVASIVE CV LAB;  Service: Cardiovascular;  Laterality: Left;   LOWER EXTREMITY ANGIOGRAPHY Right 01/21/2019   Procedure: Lower Extremity Angiography;  Surgeon: Annice Needy, MD;  Location: [facility] INVASIVE CV LAB;  Service: Cardiovascular;  Laterality: Right;   LOWER EXTREMITY ANGIOGRAPHY Right 01/28/2019   Procedure: LOWER EXTREMITY ANGIOGRAPHY;  Surgeon: Annice Needy, MD;  Location: [facility] INVASIVE CV LAB;  Service: Cardiovascular;  Laterality: Right;   LOWER EXTREMITY ANGIOGRAPHY Right 01/29/2019   Procedure: Lower Extremity Angiography;  Surgeon: Annice Needy, MD;  Location: [facility] INVASIVE CV LAB;  Service: Cardiovascular;  Laterality: Right;   LOWER EXTREMITY  ANGIOGRAPHY Right 04/04/2020   Procedure: LOWER EXTREMITY ANGIOGRAPHY;  Surgeon: Maeola Harman, MD;  Location: [facility] INVASIVE CV LAB;  Service: Cardiovascular;  Laterality: Right;   LOWER EXTREMITY INTERVENTION Right 07/02/2018   Procedure: LOWER EXTREMITY INTERVENTION;  Surgeon: Annice Needy, MD;  Location: [facility] INVASIVE CV LAB;  Service: Cardiovascular;  Laterality: Right;   PERIPHERAL VASCULAR BALLOON ANGIOPLASTY Left 04/04/2020   Procedure: PERIPHERAL VASCULAR BALLOON ANGIOPLASTY;  Surgeon: Maeola Harman, MD;  Location: [facility] INVASIVE CV LAB;  Service: Cardiovascular;  Laterality: Left;  external iliac   POLYPECTOMY N/A 02/15/2016   Procedure: POLYPECTOMY;  Surgeon: [name], MD;  Location: [facility];  Service: Endoscopy;  Laterality: N/A;    Family History  Problem Relation Age of Onset   COPD Mother    Hypertension Father    Heart attack Brother     Social History   Socioeconomic History   Marital status: Single    Spouse name: [name]   Number of children: 5   Years of education: Not on file   Highest education level: High school graduate  Occupational History   Occupation: Forklift    Comment: [employer]  Tobacco Use   Smoking status: Every Day    Packs/day: 0.50    Years: 40.00    Additional  pack years: 0.00    Total pack years: 20.00    Types: Cigarettes    Start date: 07/21/1978    Last attempt to quit: 12/2018    Years since quitting: 3.9    Passive exposure: Never   Smokeless tobacco: Never   Tobacco comments:    Started using cocaine recently but states he will try to quit   Vaping Use   Vaping Use: Never used  Substance and Sexual Activity   Alcohol use: Not Currently    Alcohol/week: 12.0 standard drinks of alcohol    Types: 12 Cans of beer per week    Comment: 4 beers / week now per pt 05/05/20   Drug use: Yes    Frequency: 2.0 times per week    Types: Cocaine   Sexual activity: Yes    Partners: Female    Birth  control/protection: None  Other Topics Concern   Not on file  Social History Narrative   Not on file   Social Determinants of Health   Financial Resource Strain: Low Risk  (03/12/2018)   Overall Financial Resource Strain (CARDIA)    Difficulty of Paying Living Expenses: Not hard at all  Food Insecurity: No Food Insecurity (03/12/2018)   Hunger Vital Sign    Worried About Running Out of Food in the Last Year: Never true    Ran Out of Food in the Last Year: Never true  Transportation Needs: No Transportation Needs (03/12/2018)   PRAPARE - Administrator, Civil Service (Medical): No    Lack of Transportation (Non-Medical): No  Physical Activity: Sufficiently Active (06/08/2018)   Exercise Vital Sign    Days of Exercise per Week: 6 days    Minutes of Exercise per Session: 60 min  Recent Concern: Physical Activity - Inactive (03/12/2018)   Exercise Vital Sign    Days of Exercise per Week: 0 days    Minutes of Exercise per Session: 0 min  Stress: No Stress Concern Present (03/12/2018)   [employer] of Occupational Health - Occupational Stress Questionnaire    Feeling of Stress : Not at all  Social Connections: Somewhat Isolated (03/12/2018)   Social Connection and Isolation Panel [NHANES]    Frequency of Communication with Friends and Family: Three times a week    Frequency of Social Gatherings with Friends and Family: Twice a week    Attends Religious Services: Never    Database administrator or Organizations: No    Attends Banker Meetings: Never    Marital Status: Married  Catering manager Violence: Not At Risk (03/12/2018)   Humiliation, Afraid, Rape, and Kick questionnaire    Fear of Current or Ex-Partner: No    Emotionally Abused: No    Physically Abused: No    Sexually Abused: No    Outpatient Encounter Medications as of 11/28/2022  Medication Sig   albuterol (VENTOLIN HFA) 108 (90 Base) MCG/ACT inhaler Inhale 2 puffs into the lungs every 6 (six)  hours as needed for wheezing or shortness of breath.   ascorbic acid (VITAMIN C) 500 MG tablet Take 1 tablet (500 mg total) by mouth daily.   aspirin EC 81 MG tablet Take 1 tablet (81 mg total) by mouth daily.   atorvastatin (LIPITOR) 40 MG tablet Take 1 tablet (40 mg total) by mouth daily.   Cholecalciferol (DIALYVITE VITAMIN D 5000) 125 MCG (5000 UT) capsule Take 5,000 Units by mouth daily.   clopidogrel (PLAVIX) 75 MG tablet Take 1 tablet (  75 mg total) by mouth daily.   DULoxetine (CYMBALTA) 60 MG capsule Take 1 capsule (60 mg total) by mouth daily.   Fluticasone-Umeclidin-Vilant (TRELEGY ELLIPTA) 100-62.5-25 MCG/ACT AEPB Inhale 1 puff into the lungs daily.   gabapentin (NEURONTIN) 100 MG capsule Take 1 capsule (100 mg total) by mouth 3 (three) times daily.   Multiple Vitamin (MULTIVITAMIN WITH MINERALS) TABS tablet Take 1 tablet by mouth daily.   pantoprazole (PROTONIX) 20 MG tablet Take 1 tablet (20 mg total) by mouth daily.   tadalafil (CIALIS) 20 MG tablet Take 0.5-1 tablets (10-20 mg total) by mouth every other day as needed for erectile dysfunction.   traZODone (DESYREL) 100 MG tablet Take 1 tablet (100 mg total) by mouth at bedtime.   valsartan (DIOVAN) 160 MG tablet Take 1 tablet (160 mg total) by mouth daily. In place of amlodipine valsartan 5/160 mg   No facility-administered encounter medications on file as of 11/28/2022.    No Known Allergies  Care Team Updated in EHR: {Yes/No:30480221}  Last Vision Exam: *** Wears corrective lenses: {Yes/No:30480221} Last Dental Exam: *** Last Hearing Exam: *** Wears Hearing Aids: {Yes/No:30480221}  Functional Ability / Safety Screening 1.  Was the timed Get Up and Go test longer than 30 seconds?  {Blank single:19197::"yes","no"} 2.  Does the patient need help with the phone, transportation, shopping,      preparing meals, housework, laundry, medications, or managing money?  {Blank single:19197::"yes","no"} 3.  Does the patient's home  have:  loose throw rugs in the hallway?   {Blank single:19197::"yes","no"}      Grab bars in the bathroom? {Blank single:19197::"yes","no"}      Handrails on the stairs?   {Blank single:19197::"yes","no"}      Poor lighting?   {Blank single:19197::"yes","no"} 4.  Has the patient noticed any hearing difficulties?   {Blank single:19197::"yes","no"}  Diet Recall and Exercise Regimen: ***  Fall Risk: *** See screening under Objective Information  Depression Screen: *** See screening under Objective Information  Advanced Directives: A voluntary discussion about advance care planning including the explanation and discussion of advance directives was discussed with the patient. Explanation about the health care proxy and living will was reviewed.  During this discussion, the patient was able to identify a health care proxy as *** and plans/does not plan to fill out the paperwork required and will bring this to our office to keep on file. Does patient have a HCPOA?    {Blank single:19197::"yes","no"} If yes, name and contact information:  Does patient have a living will or MOST form?  {Blank single:19197::"yes","no"}  Cancer Screenings: Skin: *** Lung: Low Dose CT Chest recommended if Age 2-80 years, 30 pack-year currently smoking OR have quit w/in 15years. Patient does not qualify.  Lifestyle risk factor issued reviewed: Diet, exercise, weight management, advised patient smoking is not healthy, nutrition/diet.   Prostate: *** Colon: cancer screen 02/15/16  Additional Screenings: Hepatitis B/HIV/Syphillis: 10/22/21 Hepatitis C Screening: 08/31/15 Intimate Partner Violence: Negative AAA Screen: Men age 51 to 75 years if ever smoked recommended to get a one time AAA ultrasound screening exam. Patient does not qualify.  Objective:   Vitals: There were no vitals taken for this visit. There is no height or weight on file to calculate BMI.  Lab Results  Component Value Date   CHOL 190 10/09/2022    CHOL 141 10/22/2021   CHOL 132 01/17/2021   Lab Results  Component Value Date   HDL 57 10/09/2022   HDL 46 10/22/2021   HDL 46  01/17/2021   Lab Results  Component Value Date   LDLCALC 116 (H) 10/09/2022   LDLCALC 81 10/22/2021   LDLCALC 72 01/17/2021   Lab Results  Component Value Date   TRIG 71 10/09/2022   TRIG 61 10/22/2021   TRIG 59 01/17/2021   Lab Results  Component Value Date   CHOLHDL 3.3 10/09/2022   CHOLHDL 3.1 10/22/2021   CHOLHDL 2.9 01/17/2021   No results found for: "LDLDIRECT"  No results found.  Physical Exam Constitutional: Patient appears well-developed and well-nourished. Obese *** No distress.  HEENT: head atraumatic, normocephalic, pupils equal and reactive to light, ears ***, neck supple, throat within normal limits Cardiovascular: Normal rate, regular rhythm and normal heart sounds.  No murmur heard. No BLE edema. Pulmonary/Chest: Effort normal and breath sounds normal. No respiratory distress. Abdominal: Soft.  There is no tenderness. Psychiatric: Patient has a normal mood and affect. behavior is normal. Judgment and thought content normal.   Cognitive Testing - 6-CIT  Correct? Score   What year is it? {YES NO:22349} {Numbers; 0-4:31231} Yes = 0    No = 4  What month is it? {YES NO:22349} {Numbers; 0-4:31231} Yes = 0    No = 3  Remember:     Floyde [address]ins[address], [place]     What time is it? {YES NO:22349} {Numbers; 0-4:31231} Yes = 0    No = 3  Count backwards from 20 to 1 {YES NO:22349} {Numbers; 0-4:31231} Correct = 0    1 error = 2   More than 1 error = 4  Say the months of the year in reverse. {YES NO:22349} {Numbers; 0-4:31231} Correct = 0    1 error = 2   More than 1 error = 4  What address did I ask you to remember? {YES NO:22349} {NUMBERS; 0-10:5044} Correct = 0  1 error = 2    2 error = 4    3 error = 6    4 error = 8    All wrong = 10       TOTAL SCORE  {Numbers; 6-04:54098}/11   Interpretation:  {Desc;  normal/abnormal:11317::"Normal"}  Normal (0-7) Abnormal (8-28)   Fall Risk:    11/05/2022   11:06 AM 10/09/2022   10:54 AM 08/27/2022   11:13 AM 05/14/2022    2:56 PM 01/24/2022    9:04 AM  Fall Risk   Falls in the past year? 0 0 0 0 0  Number falls in past yr: 0 0  0 0  Injury with Fall? 0 0  0 0  Risk for fall due to : No Fall Risks No Fall Risks No Fall Risks No Fall Risks No Fall Risks  Follow up Falls prevention discussed Falls prevention discussed Falls prevention discussed Falls prevention discussed Falls prevention discussed    Depression Screen    11/05/2022   11:06 AM 10/09/2022   10:55 AM 08/27/2022   11:13 AM 05/14/2022    2:57 PM 01/24/2022    9:05 AM  Depression screen PHQ 2/9  Decreased Interest 0 0 0 0 0  Down, Depressed, Hopeless 0 0 0 0 0  PHQ - 2 Score 0 0 0 0 0  Altered sleeping 0 0 0 0 0  Tired, decreased energy 0 0 0 0 0  Change in appetite 0 0 3 0 0  Feeling bad or failure about yourself  0 0 0 0 0  Trouble concentrating 0 0 0 0 0  Moving  slowly or fidgety/restless 0 0 0 0 0  Suicidal thoughts 0 0 0 0 0  PHQ-9 Score 0 0 3 0 0  Difficult doing work/chores   Not difficult at all       Assessment & Plan:     1. Welcome to Medicare preventive visit   *** type dotphrase "dot"diagmed to refresh this list  Exercise Activities and Dietary recommendations  Goals   None    Discussed health benefits of physical activity, and encouraged him to engage in regular exercise appropriate for his age and condition.   Immunization History  Administered Date(s) Administered   Influenza,inj,Quad PF,6+ Mos 03/12/2018, 04/09/2019, 03/14/2020, 05/21/2021, 08/27/2022   PFIZER(Purple Top)SARS-COV-2 Vaccination 10/02/2019, 10/26/2019   Pneumococcal Polysaccharide-23 03/12/2018   Tdap 01/17/2021   Zoster Recombinat (Shingrix) 07/13/2020, 01/17/2021    Health Maintenance  Topic Date Due   Colonoscopy  02/14/2021   COVID-19 Vaccine (3 - 2023-24 season) 02/22/2022    Lung Cancer Screening  08/03/2022   Pneumonia Vaccine 16+ Years old (2 of 2 - PCV) 05/14/2023 (Originally 03/13/2019)   INFLUENZA VACCINE  01/23/2023   Medicare Annual Wellness (AWV)  11/28/2023   DTaP/Tdap/Td (2 - Td or Tdap) 01/18/2031   Hepatitis C Screening  Completed   HIV Screening  Completed   Zoster Vaccines- Shingrix  Completed   HPV VACCINES  Aged Out     No orders of the defined types were placed in this encounter.   Current Outpatient Medications:    albuterol (VENTOLIN HFA) 108 (90 Base) MCG/ACT inhaler, Inhale 2 puffs into the lungs every 6 (six) hours as needed for wheezing or shortness of breath., Disp: 18 g, Rfl: 0   ascorbic acid (VITAMIN C) 500 MG tablet, Take 1 tablet (500 mg total) by mouth daily., Disp: 30 tablet, Rfl: 0   aspirin EC 81 MG tablet, Take 1 tablet (81 mg total) by mouth daily., Disp: 90 tablet, Rfl: 3   atorvastatin (LIPITOR) 40 MG tablet, Take 1 tablet (40 mg total) by mouth daily., Disp: 90 tablet, Rfl: 1   Cholecalciferol (DIALYVITE VITAMIN D 5000) 125 MCG (5000 UT) capsule, Take 5,000 Units by mouth daily., Disp: , Rfl:    clopidogrel (PLAVIX) 75 MG tablet, Take 1 tablet (75 mg total) by mouth daily., Disp: 90 tablet, Rfl: 1   DULoxetine (CYMBALTA) 60 MG capsule, Take 1 capsule (60 mg total) by mouth daily., Disp: 90 capsule, Rfl: 1   Fluticasone-Umeclidin-Vilant (TRELEGY ELLIPTA) 100-62.5-25 MCG/ACT AEPB, Inhale 1 puff into the lungs daily., Disp: 3 each, Rfl: 1   gabapentin (NEURONTIN) 100 MG capsule, Take 1 capsule (100 mg total) by mouth 3 (three) times daily., Disp: 270 capsule, Rfl: 1   Multiple Vitamin (MULTIVITAMIN WITH MINERALS) TABS tablet, Take 1 tablet by mouth daily., Disp: , Rfl:    pantoprazole (PROTONIX) 20 MG tablet, Take 1 tablet (20 mg total) by mouth daily., Disp: 90 tablet, Rfl: 1   tadalafil (CIALIS) 20 MG tablet, Take 0.5-1 tablets (10-20 mg total) by mouth every other day as needed for erectile dysfunction., Disp: 10 tablet,  Rfl: 2   traZODone (DESYREL) 100 MG tablet, Take 1 tablet (100 mg total) by mouth at bedtime., Disp: 90 tablet, Rfl: 1   valsartan (DIOVAN) 160 MG tablet, Take 1 tablet (160 mg total) by mouth daily. In place of amlodipine valsartan 5/160 mg, Disp: 30 tablet, Rfl: 0 There are no discontinued medications.  I have personally reviewed and addressed the Medicare Annual Wellness health risk assessment questionnaire  and have noted the following in the patient's chart:  A.         Medical and social history & family history B.         Use of alcohol, tobacco or illicit drugs  C.         Current medications and supplements D.         Functional and Cognitive ability and status E.         Nutritional status F.         Physical activity G.        Advance directives H.         List of other physicians I.          Hospitalizations, surgeries, and ER visits in previous 12 months J.         Vitals K.         Screenings such as hearing and vision if needed, cognitive and depression L.         Referrals and appointments - ***  In addition, I have reviewed and discussed with patient certain preventive protocols, quality metrics, and best practice recommendations. A written personalized care plan for preventive services as well as general preventive health recommendations were provided to patient.   See attached scanned questionnaire for additional information.   No follow-ups on file.  *** refresh to show

## 2022-11-28 ENCOUNTER — Ambulatory Visit: Payer: 59 | Admitting: Family Medicine

## 2022-11-28 DIAGNOSIS — Z0000 Encounter for general adult medical examination without abnormal findings: Secondary | ICD-10-CM

## 2022-12-12 ENCOUNTER — Other Ambulatory Visit: Payer: Self-pay | Admitting: Family Medicine

## 2022-12-12 DIAGNOSIS — N183 Chronic kidney disease, stage 3 unspecified: Secondary | ICD-10-CM

## 2022-12-16 ENCOUNTER — Telehealth: Payer: Self-pay

## 2022-12-16 NOTE — Telephone Encounter (Signed)
Patient stated he forgot his appt and did not receive instructions for his colonoscopy.   Address confirmed colonoscopy rescheduled to 02/06/23 with Dr. Servando Snare.  Rosann Auerbach has been informed of date change. Instructions prepared and mailed.  Pt has not picked up his prescription for colonoscopy. It was sent over on 10/15/22 to his pharmacy.  I've lvm for pharmacy to fill rx and contact patient when its ready. Unsure if patient actually takes Plavix because during triage he stated he was not taking it.  I will send a blood thinner request to Dr. Carlynn Purl just in case patient is taking and not familiar with med names. Medication dispense history indicates he does take.  Thanks,  [place], [place]

## 2022-12-17 ENCOUNTER — Telehealth: Payer: Self-pay

## 2022-12-17 NOTE — Telephone Encounter (Signed)
Blood thinner advice has been received from Dr. Carlynn Purl.  She has advised patient to stop Plavix 2 days prior to colonoscopy and restart 1 day after his colonoscopy.    Pt verbalized understanding and I will mail him a copy of advice letter as a reminder.  Thanks,  [place], [place]

## 2022-12-29 ENCOUNTER — Other Ambulatory Visit: Payer: Self-pay | Admitting: Family Medicine

## 2022-12-29 DIAGNOSIS — I129 Hypertensive chronic kidney disease with stage 1 through stage 4 chronic kidney disease, or unspecified chronic kidney disease: Secondary | ICD-10-CM

## 2022-12-29 DIAGNOSIS — I10 Essential (primary) hypertension: Secondary | ICD-10-CM

## 2023-01-06 ENCOUNTER — Telehealth: Payer: Self-pay | Admitting: Family Medicine

## 2023-01-06 NOTE — Telephone Encounter (Unsigned)
Copied from CRM [id number]. Topic: General - Inquiry >> Jan 06, 2023  1:06 PM De Blanch wrote: Reason for CRM: Pt is requesting information on what Doctor / office gave him his prosthetic. He stated his leg is going deeper into prosthetics.  Stated it was not Dr. Addison Bailey.  Please advice.

## 2023-01-06 NOTE — Progress Notes (Unsigned)
Name: Ryan [name]   MRN: [id number]    DOB: September 19, 1955   Date:01/07/2023       Progress Note  Subjective  Chief Complaint  Follow Up  HPI  HTN/CKI stage III : He is on ARB.  He denies chest pain or palpitation, he denies pruritus and has a good urine output . His BP was very low during his visit March 2024, spiked during his visit 09/2022 but he has skipped bp medication that morning, went to vascular surgeon early May and bp was 80's/ 60's and it was last visit in April during at our office, we changed from lotrel to valsartan , but he has out of medication for the past week and bp is at goal today, it is likely due to no longer using cocaine, we will stop valsartan today, GFR also improved at 52    Phantom pains:  It is aggravated by walking ( uses prosthesis) for a prolonged period of time, he states the pain is shooting and sharp at times but doing well on medication    Malnutrition: he had lost almost 10 % of his body weight before his last visit May 2023  at the time he told me that the only change was that he was  doing cocaine a few times a week for the previous two months. Today he is down another 23 lbs since last May. He states has not used any cocaine for past 6 months, weight is trending up , stable now at 126 lbs.    Dysthymia: States he is doing "good", taking Duloxetine and denies side effects . Denies sadness or depressed mood today. Phq 9 is at goal    Insomnia: taking Trazodone 100 mg every night and is able to fall asleep around midnight and wakes up around 6 am. Doing well    Emphysema: he is using Trelegy, he has been noticing some wheezing since he ran out of medication recently . No cough or SOB    Atherosclerosis of Ectasy ascending aorta: reviewed last CT and no aneurysm but  ascending aorta was 3.8 cm . He sees Dr. Randie Heinz for his PVD, but patient is not sure if Dr. Randie Heinz knows about the aneurysm. He takes atorvastatin 40 mg daily , he states he is compliant , last  LDL was not at goal, but we will recheck next visit . Last CT chest did not show the aneurysm  History of Alcoholism: he is off B12 because level was high, also off B1. He no longer drinks , since he had leg amputated back in 2021 . Unchanged    ED: he  needs a refill of Cialis    Patient Active Problem List   Diagnosis Date Noted   Cocaine use disorder, mild, in early remission (HCC) 08/27/2022   History of alcoholism (HCC) 01/24/2022   History of amputation below knee, right (HCC) 01/24/2022   Moderate protein malnutrition (HCC) 01/24/2022   Benign hypertension with chronic kidney disease, stage III (HCC) 01/24/2022   Aneurysm of ascending aorta without rupture (HCC) 01/24/2022   Stage 3a chronic kidney disease (HCC) 01/24/2022   Phantom pain after amputation of lower extremity (HCC)    Below-knee amputation of right lower extremity (HCC) 05/17/2020   Iron deficiency anemia 11/23/2018   Stage 3 chronic kidney disease (HCC) 10/21/2018   Atherosclerosis of native arteries of extremity with rest pain (HCC) 10/13/2018   PAD (peripheral artery disease) (HCC) 07/21/2018   Claudication (HCC) 03/17/2018   B12  deficiency 03/12/2018   Vitamin B1 deficiency 03/12/2018   Varicose veins of leg with pain, right 03/12/2018   Enlarged thoracic aorta (HCC) 02/24/2018   Alcoholism /alcohol abuse 02/24/2018   Paresthesia 02/24/2018   Ectatic aorta (HCC) 02/20/2018   Emphysema lung (HCC) 02/20/2018   Atherosclerosis of aorta (HCC) 02/20/2018   Hypertension, benign 08/31/2015   Tobacco use 08/31/2015    Past Surgical History:  Procedure Laterality Date   ABDOMINAL AORTOGRAM W/LOWER EXTREMITY N/A 09/13/2019   Procedure: ABDOMINAL AORTOGRAM W/LOWER EXTREMITY;  Surgeon: Maeola Harman, MD;  Location: [facility] INVASIVE CV LAB;  Service: Cardiovascular;  Laterality: N/A;   AMPUTATION Right 05/09/2020   Procedure: RIGHT BELOW KNEE AMPUTATION;  Surgeon: Maeola Harman, MD;  Location: [facility]  OR;  Service: Vascular;  Laterality: Right;  w/ a block   COLONOSCOPY WITH PROPOFOL N/A 02/15/2016   Procedure: COLONOSCOPY WITH PROPOFOL;  Surgeon: [name], MD;  Location: [facility] SURGERY CNTR;  Service: Endoscopy;  Laterality: N/A;   FEMORAL-POPLITEAL BYPASS GRAFT Right 09/14/2019   Procedure: BYPASS GRAFT FEMORAL-POPLITEAL ARTERY;  Surgeon: Maeola Harman, MD;  Location: [facility] OR;  Service: Vascular;  Laterality: Right;   HERNIA REPAIR  1999   left inguinal   INSERTION OF ILIAC STENT Right 09/14/2019   Procedure: Insertion Of Common and External Iliac Stent;  Surgeon: Maeola Harman, MD;  Location: [facility] OR;  Service: Vascular;  Laterality: Right;   LOWER EXTREMITY ANGIOGRAPHY Right 05/07/2018   Procedure: LOWER EXTREMITY ANGIOGRAPHY;  Surgeon: Annice Needy, MD;  Location: [facility] INVASIVE CV LAB;  Service: Cardiovascular;  Laterality: Right;   LOWER EXTREMITY ANGIOGRAPHY Right 06/25/2018   Procedure: LOWER EXTREMITY ANGIOGRAPHY;  Surgeon: Annice Needy, MD;  Location: [facility] INVASIVE CV LAB;  Service: Cardiovascular;  Laterality: Right;   LOWER EXTREMITY ANGIOGRAPHY Right 07/01/2018   Procedure: LOWER EXTREMITY ANGIOGRAPHY;  Surgeon: Annice Needy, MD;  Location: [facility] INVASIVE CV LAB;  Service: Cardiovascular;  Laterality: Right;   LOWER EXTREMITY ANGIOGRAPHY Right 08/12/2018   Procedure: LOWER EXTREMITY ANGIOGRAPHY;  Surgeon: Annice Needy, MD;  Location: [facility] INVASIVE CV LAB;  Service: Cardiovascular;  Laterality: Right;   LOWER EXTREMITY ANGIOGRAPHY Right 09/03/2018   Procedure: LOWER EXTREMITY ANGIOGRAPHY;  Surgeon: Annice Needy, MD;  Location: [facility] INVASIVE CV LAB;  Service: Cardiovascular;  Laterality: Right;   LOWER EXTREMITY ANGIOGRAPHY Right 09/04/2018   Procedure: Lower Extremity Angiography;  Surgeon: Annice Needy, MD;  Location: [facility] INVASIVE CV LAB;  Service: Cardiovascular;  Laterality: Right;   LOWER EXTREMITY ANGIOGRAPHY Right 10/01/2018   Procedure: LOWER EXTREMITY  ANGIOGRAPHY;  Surgeon: Annice Needy, MD;  Location: [facility] INVASIVE CV LAB;  Service: Cardiovascular;  Laterality: Right;   LOWER EXTREMITY ANGIOGRAPHY Right 10/01/2018   Procedure: Lower Extremity Angiography;  Surgeon: Annice Needy, MD;  Location: [facility] INVASIVE CV LAB;  Service: Cardiovascular;  Laterality: Right;   LOWER EXTREMITY ANGIOGRAPHY Right 10/15/2018   Procedure: LOWER EXTREMITY ANGIOGRAPHY;  Surgeon: Annice Needy, MD;  Location: [facility] INVASIVE CV LAB;  Service: Cardiovascular;  Laterality: Right;   LOWER EXTREMITY ANGIOGRAPHY Left 10/16/2018   Procedure: Lower Extremity Angiography;  Surgeon: Annice Needy, MD;  Location: [facility] INVASIVE CV LAB;  Service: Cardiovascular;  Laterality: Left;   LOWER EXTREMITY ANGIOGRAPHY Left 01/20/2019   Procedure: LOWER EXTREMITY ANGIOGRAPHY;  Surgeon: Annice Needy, MD;  Location: [facility] INVASIVE CV LAB;  Service: Cardiovascular;  Laterality: Left;   LOWER EXTREMITY ANGIOGRAPHY Right 01/21/2019   Procedure: Lower Extremity Angiography;  Surgeon:  Annice Needy, MD;  Location: [facility] INVASIVE CV LAB;  Service: Cardiovascular;  Laterality: Right;   LOWER EXTREMITY ANGIOGRAPHY Right 01/28/2019   Procedure: LOWER EXTREMITY ANGIOGRAPHY;  Surgeon: Annice Needy, MD;  Location: [facility] INVASIVE CV LAB;  Service: Cardiovascular;  Laterality: Right;   LOWER EXTREMITY ANGIOGRAPHY Right 01/29/2019   Procedure: Lower Extremity Angiography;  Surgeon: Annice Needy, MD;  Location: [facility] INVASIVE CV LAB;  Service: Cardiovascular;  Laterality: Right;   LOWER EXTREMITY ANGIOGRAPHY Right 04/04/2020   Procedure: LOWER EXTREMITY ANGIOGRAPHY;  Surgeon: Maeola Harman, MD;  Location: [facility] INVASIVE CV LAB;  Service: Cardiovascular;  Laterality: Right;   LOWER EXTREMITY INTERVENTION Right 07/02/2018   Procedure: LOWER EXTREMITY INTERVENTION;  Surgeon: Annice Needy, MD;  Location: [facility] INVASIVE CV LAB;  Service: Cardiovascular;  Laterality: Right;   PERIPHERAL VASCULAR BALLOON ANGIOPLASTY Left  04/04/2020   Procedure: PERIPHERAL VASCULAR BALLOON ANGIOPLASTY;  Surgeon: Maeola Harman, MD;  Location: [facility] INVASIVE CV LAB;  Service: Cardiovascular;  Laterality: Left;  external iliac   POLYPECTOMY N/A 02/15/2016   Procedure: POLYPECTOMY;  Surgeon: [name], MD;  Location: [facility] SURGERY [facility];  Service: Endoscopy;  Laterality: N/A;    Family History  Problem Relation Age of Onset   COPD Mother    Hypertension Father    Heart attack Brother     Social History   Tobacco Use   Smoking status: Every Day    Current packs/day: 0.00    Average packs/day: 0.5 packs/day for 40.4 years (20.2 ttl pk-yrs)    Types: Cigarettes    Start date: 07/21/1978    Last attempt to quit: 12/2018    Years since quitting: 4.0    Passive exposure: Never   Smokeless tobacco: Never   Tobacco comments:    Started using cocaine recently but states he will try to quit   Substance Use Topics   Alcohol use: Not Currently    Alcohol/week: 12.0 standard drinks of alcohol    Types: 12 Cans of beer per week    Comment: 4 beers / week now per pt 05/05/20     Current Outpatient Medications:    albuterol (VENTOLIN HFA) 108 (90 Base) MCG/ACT inhaler, Inhale 2 puffs into the lungs every 6 (six) hours as needed for wheezing or shortness of breath., Disp: 18 g, Rfl: 0   ascorbic acid (VITAMIN C) 500 MG tablet, Take 1 tablet (500 mg total) by mouth daily., Disp: 30 tablet, Rfl: 0   aspirin EC 81 MG tablet, Take 1 tablet (81 mg total) by mouth daily., Disp: 90 tablet, Rfl: 3   atorvastatin (LIPITOR) 40 MG tablet, Take 1 tablet (40 mg total) by mouth daily., Disp: 90 tablet, Rfl: 1   Cholecalciferol (DIALYVITE VITAMIN D 5000) 125 MCG (5000 UT) capsule, Take 5,000 Units by mouth daily., Disp: , Rfl:    clopidogrel (PLAVIX) 75 MG tablet, Take 1 tablet (75 mg total) by mouth daily., Disp: 90 tablet, Rfl: 1   DULoxetine (CYMBALTA) 60 MG capsule, Take 1 capsule (60 mg total) by mouth daily., Disp: 90 capsule,  Rfl: 1   Fluticasone-Umeclidin-Vilant (TRELEGY ELLIPTA) 100-62.5-25 MCG/ACT AEPB, Inhale 1 puff into the lungs daily., Disp: 3 each, Rfl: 1   gabapentin (NEURONTIN) 100 MG capsule, Take 1 capsule (100 mg total) by mouth 3 (three) times daily., Disp: 270 capsule, Rfl: 1   Multiple Vitamin (MULTIVITAMIN WITH MINERALS) TABS tablet, Take 1 tablet by mouth daily., Disp: , Rfl:    pantoprazole (PROTONIX) 20 MG  tablet, Take 1 tablet (20 mg total) by mouth daily., Disp: 90 tablet, Rfl: 1   tadalafil (CIALIS) 20 MG tablet, Take 0.5-1 tablets (10-20 mg total) by mouth every other day as needed for erectile dysfunction., Disp: 10 tablet, Rfl: 2   traZODone (DESYREL) 100 MG tablet, Take 1 tablet (100 mg total) by mouth at bedtime., Disp: 90 tablet, Rfl: 1   valsartan (DIOVAN) 160 MG tablet, TAKE 1 TABLET BY MOUTH ONCE DAILY. IN  PLACE  OF  AMLODIPINE/VALSARTAN  5/160MG , Disp: 7 tablet, Rfl: 0  No Known Allergies  I personally reviewed active problem list, medication list, allergies, family history, social history, health maintenance with the patient/caregiver today.   ROS  Ten systems reviewed and is negative except as mentioned in HPI    Objective  Vitals:   01/07/23 0740  BP: 126/74  Pulse: 72  Resp: 16  SpO2: 96%  Weight: 126 lb (57.2 kg)  Height: 5\' 8"  (1.727 m)    Body mass index is 19.16 kg/m.  Physical Exam  Constitutional: Patient appears well-developed and well-nourished. No distress.  HEENT: head atraumatic, normocephalic, pupils equal and reactive to light, neck supple Cardiovascular: Normal rate, regular rhythm and normal heart sounds.  No murmur heard. No BLE edema. Pulmonary/Chest: Effort normal and breath sounds normal. No respiratory distress. Muscular skeletal: AKA using a prosthesis  Abdominal: Soft.  There is no tenderness. Psychiatric: Patient has a normal mood and affect. behavior is normal. Judgment and thought content normal.    PHQ2/9:    01/07/2023    7:40  AM 11/05/2022   11:06 AM 10/09/2022   10:55 AM 08/27/2022   11:13 AM 05/14/2022    2:57 PM  Depression screen PHQ 2/9  Decreased Interest 0 0 0 0 0  Down, Depressed, Hopeless 0 0 0 0 0  PHQ - 2 Score 0 0 0 0 0  Altered sleeping 0 0 0 0 0  Tired, decreased energy 0 0 0 0 0  Change in appetite 0 0 0 3 0  Feeling bad or failure about yourself  0 0 0 0 0  Trouble concentrating 0 0 0 0 0  Moving slowly or fidgety/restless 0 0 0 0 0  Suicidal thoughts 0 0 0 0 0  PHQ-9 Score 0 0 0 3 0  Difficult doing work/chores    Not difficult at all     phq 9 is negative   Fall Risk:    01/07/2023    7:40 AM 11/05/2022   11:06 AM 10/09/2022   10:54 AM 08/27/2022   11:13 AM 05/14/2022    2:56 PM  Fall Risk   Falls in the past year? 0 0 0 0 0  Number falls in past yr: 0 0 0  0  Injury with Fall? 0 0 0  0  Risk for fall due to : No Fall Risks No Fall Risks No Fall Risks No Fall Risks No Fall Risks  Follow up Falls prevention discussed Falls prevention discussed Falls prevention discussed Falls prevention discussed Falls prevention discussed      Functional Status Survey: Is the patient deaf or have difficulty hearing?: Yes Does the patient have difficulty seeing, even when wearing glasses/contacts?: Yes Does the patient have difficulty concentrating, remembering, or making decisions?: No Does the patient have difficulty walking or climbing stairs?: No Does the patient have difficulty dressing or bathing?: No Does the patient have difficulty doing errands alone such as visiting a doctor's office or shopping?: No  Assessment & Plan  1. Atherosclerosis of aorta (HCC)  - atorvastatin (LIPITOR) 40 MG tablet; Take 1 tablet (40 mg total) by mouth daily.  Dispense: 90 tablet; Refill: 1  2. Stage 3a chronic kidney disease (HCC)  We will stop ARB since Bp is at goal without taking medication for the past week  3. Other emphysema (HCC)  On Trelegy   4. PAD (peripheral artery disease)  (HCC)  - clopidogrel (PLAVIX) 75 MG tablet; Take 1 tablet (75 mg total) by mouth daily.  Dispense: 90 tablet; Refill: 1 - atorvastatin (LIPITOR) 40 MG tablet; Take 1 tablet (40 mg total) by mouth daily.  Dispense: 90 tablet; Refill: 1  5. Cocaine use disorder, mild, in early remission (HCC)  Bp likely normal due to no longer using cocaine  6. Enlarged thoracic aorta (HCC)  Under the care of vascular surgeon   7. History of amputation below knee, right (HCC)  Stable   8. Phantom pain after amputation of lower extremity (HCC)  - DULoxetine (CYMBALTA) 60 MG capsule; Take 1 capsule (60 mg total) by mouth daily.  Dispense: 90 capsule; Refill: 1 - gabapentin (NEURONTIN) 100 MG capsule; Take 1 capsule (100 mg total) by mouth 3 (three) times daily.  Dispense: 270 capsule; Refill: 1  Doing better since taking medication  9. History of alcoholism (HCC)  - pantoprazole (PROTONIX) 20 MG tablet; Take 1 tablet (20 mg total) by mouth daily.  Dispense: 90 tablet; Refill: 1  10. Other insomnia  Continue trazodone   11. B12 deficiency  - cyanocobalamin (VITAMIN B12) injection 1,000 mcg  12. ED (erectile dysfunction) of organic origin  - tadalafil (CIALIS) 20 MG tablet; Take 0.5-1 tablets (10-20 mg total) by mouth every other day as needed for erectile dysfunction.  Dispense: 10 tablet; Refill: 2

## 2023-01-07 ENCOUNTER — Telehealth: Payer: Self-pay

## 2023-01-07 ENCOUNTER — Ambulatory Visit (INDEPENDENT_AMBULATORY_CARE_PROVIDER_SITE_OTHER): Payer: 59 | Admitting: Family Medicine

## 2023-01-07 ENCOUNTER — Encounter: Payer: Self-pay | Admitting: Family Medicine

## 2023-01-07 VITALS — BP 126/74 | HR 72 | Resp 16 | Ht 68.0 in | Wt 126.0 lb

## 2023-01-07 DIAGNOSIS — G546 Phantom limb syndrome with pain: Secondary | ICD-10-CM | POA: Diagnosis not present

## 2023-01-07 DIAGNOSIS — F17210 Nicotine dependence, cigarettes, uncomplicated: Secondary | ICD-10-CM

## 2023-01-07 DIAGNOSIS — N1831 Chronic kidney disease, stage 3a: Secondary | ICD-10-CM | POA: Diagnosis not present

## 2023-01-07 DIAGNOSIS — I739 Peripheral vascular disease, unspecified: Secondary | ICD-10-CM

## 2023-01-07 DIAGNOSIS — Z89511 Acquired absence of right leg below knee: Secondary | ICD-10-CM

## 2023-01-07 DIAGNOSIS — J438 Other emphysema: Secondary | ICD-10-CM

## 2023-01-07 DIAGNOSIS — I7789 Other specified disorders of arteries and arterioles: Secondary | ICD-10-CM | POA: Diagnosis not present

## 2023-01-07 DIAGNOSIS — F1411 Cocaine abuse, in remission: Secondary | ICD-10-CM

## 2023-01-07 DIAGNOSIS — E538 Deficiency of other specified B group vitamins: Secondary | ICD-10-CM

## 2023-01-07 DIAGNOSIS — Z87891 Personal history of nicotine dependence: Secondary | ICD-10-CM

## 2023-01-07 DIAGNOSIS — F1021 Alcohol dependence, in remission: Secondary | ICD-10-CM

## 2023-01-07 DIAGNOSIS — G4709 Other insomnia: Secondary | ICD-10-CM

## 2023-01-07 DIAGNOSIS — N529 Male erectile dysfunction, unspecified: Secondary | ICD-10-CM

## 2023-01-07 DIAGNOSIS — I700 Atherosclerosis of aorta: Secondary | ICD-10-CM

## 2023-01-07 MED ORDER — DULOXETINE HCL 60 MG PO CPEP: 60.0000 mg | ORAL_CAPSULE | Freq: Every day | ORAL | 1 refills | Status: DC

## 2023-01-07 MED ORDER — ATORVASTATIN CALCIUM 40 MG PO TABS: 40.0000 mg | ORAL_TABLET | Freq: Every day | ORAL | 1 refills | Status: DC

## 2023-01-07 MED ORDER — GABAPENTIN 100 MG PO CAPS: 100.0000 mg | ORAL_CAPSULE | Freq: Three times a day (TID) | ORAL | 1 refills | Status: DC

## 2023-01-07 MED ORDER — PANTOPRAZOLE SODIUM 20 MG PO TBEC: 20.0000 mg | DELAYED_RELEASE_TABLET | Freq: Every day | ORAL | 1 refills | Status: DC

## 2023-01-07 MED ORDER — TADALAFIL 20 MG PO TABS
10.0000 mg | ORAL_TABLET | ORAL | 2 refills | Status: DC | PRN
Start: 2023-01-07 — End: 2024-05-10

## 2023-01-07 MED ORDER — TRELEGY ELLIPTA 100-62.5-25 MCG/ACT IN AEPB: 1.0000 | INHALATION_SPRAY | Freq: Every day | RESPIRATORY_TRACT | 1 refills | Status: DC

## 2023-01-07 MED ORDER — CLOPIDOGREL BISULFATE 75 MG PO TABS: 75.0000 mg | ORAL_TABLET | Freq: Every day | ORAL | 1 refills | Status: DC

## 2023-01-07 MED ORDER — CYANOCOBALAMIN 1000 MCG/ML IJ SOLN
1000.0000 ug | Freq: Once | INTRAMUSCULAR | Status: AC
Start: 2023-01-07 — End: 2023-01-07
  Administered 2023-01-07: 1000 ug via INTRAMUSCULAR

## 2023-01-07 NOTE — Telephone Encounter (Signed)
Patient had an appt today. Inquired about his prothesis, and he stated he found the information for it and called the number that had previously been given to him.

## 2023-01-07 NOTE — Telephone Encounter (Signed)
Received staff message by PCP requesting to contact patient for scheduling annual LDCT.  Last LDCT 2021.  Last attempt to contact patient was March 2024.  Called today but no answer.  Left VM message to call for scheduling. New order placed for annual LDCT.

## 2023-01-23 ENCOUNTER — Ambulatory Visit (HOSPITAL_COMMUNITY): Payer: 59

## 2023-01-30 ENCOUNTER — Other Ambulatory Visit: Payer: Self-pay

## 2023-01-30 ENCOUNTER — Telehealth: Payer: Self-pay | Admitting: Family Medicine

## 2023-01-30 DIAGNOSIS — Z89511 Acquired absence of right leg below knee: Secondary | ICD-10-CM

## 2023-01-30 NOTE — Telephone Encounter (Signed)
Ordered and faxed.

## 2023-01-30 NOTE — Telephone Encounter (Signed)
Pt is calling to see if his PCP will approve the insert that goes over his leg that goes into his prosthetic. Pt would need the approval to go to [facility].  Phone Number: [phone] Address: [address] [place], [place] [address]

## 2023-02-06 ENCOUNTER — Ambulatory Visit: Admission: RE | Admit: 2023-02-06 | Payer: 59 | Source: Home / Self Care | Admitting: Gastroenterology

## 2023-02-06 ENCOUNTER — Encounter: Admission: RE | Payer: Self-pay | Source: Home / Self Care

## 2023-02-06 SURGERY — COLONOSCOPY WITH PROPOFOL
Anesthesia: General

## 2023-02-07 ENCOUNTER — Telehealth: Payer: Self-pay | Admitting: Family Medicine

## 2023-02-07 NOTE — Telephone Encounter (Unsigned)
Copied from CRM [id number]. Topic: General - Inquiry >> Feb 07, 2023 12:32 PM Marlow Baars wrote: Reason for CRM: The patient called in again wanting to speak with someone regarding his prosthesis. He says he needs approval from his primary care provider so he can get a new one. Please assist patient further

## 2023-03-19 ENCOUNTER — Telehealth: Payer: Self-pay | Admitting: Family Medicine

## 2023-03-19 NOTE — Telephone Encounter (Signed)
Patient is calling in requesting a prescription for a new socket for his prosthetic leg.

## 2023-03-19 NOTE — Telephone Encounter (Unsigned)
Copied from CRM [id number]. Topic: General - Other >> Mar 19, 2023 10:44 AM Epimenio Foot F wrote: Reason for CRM: Pt is calling in wanting to know if anyone from disability contacted the office regarding his case.

## 2023-03-28 ENCOUNTER — Telehealth: Payer: Self-pay | Admitting: Family Medicine

## 2023-03-28 NOTE — Telephone Encounter (Signed)
Pt is calling to follow up on socket for his amputated leg. Prescribtion to be sent to [facility]

## 2023-03-28 NOTE — Telephone Encounter (Signed)
Pt is calling to follow up on socket for his amputated leg. Script to be sent to [facility]

## 2023-03-28 NOTE — Telephone Encounter (Signed)
Re-printed and faxed to [facility]

## 2023-03-31 ENCOUNTER — Other Ambulatory Visit: Payer: Self-pay

## 2023-03-31 ENCOUNTER — Telehealth: Payer: Self-pay

## 2023-03-31 DIAGNOSIS — Z89511 Acquired absence of right leg below knee: Secondary | ICD-10-CM

## 2023-03-31 NOTE — Telephone Encounter (Signed)
Spoke with patient he stated the disability paperwork will be sent to see if he's eligible for disability. I informed patient that we can review it when it comes (gave him fax number) but that Dr. Carlynn Purl does not perform disability ratings in case it asks for that. He verbalized understanding. Will await paperwork and contact patient after arrival.

## 2023-03-31 NOTE — Telephone Encounter (Signed)
Spoke with DME supplier at: [phone] [facility]) on behalf of patient and clarified the DME order needed to specifically say "Socket that goes into his prosthetic," instead of its current wording of "insert that goes into his prosthetic." New order placed, signed by Dr. Carlynn Purl and faxed with confirmation to: [phone].  Patient updated and will let us know if he needs anything further.

## 2023-04-10 NOTE — Progress Notes (Signed)
Name: Ryan [name]   MRN: [id number]    DOB: August 03, 1955   Date:04/11/2023       Progress Note  Subjective  Chief Complaint  Follow Up  HPI  HTN/CKI stage III a: He is on ARB.  He denies chest pain or palpitation, he denies pruritus and has a good urine output .GFR was down to 30's but improved to 52 last visit. 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  However he continue to lose weight until last visit . He states no cocaine use in the apst 6 months and weight today is up from 126 lbs to 130 lbs    Dysthymia: States he is doing "good", taking Duloxetine and denies side effects . Denies sadness or depressed mood today. Doing well on current regiment    Insomnia: taking Trazodone 100 mg every night and is able to fall asleep around midnight and wakes up around 6 am. Continue medication   Emphysema: he is out of Trelegy, he states it was not at the pharmacy, advised to call me if unable to get it , may need PA. He also needs refill of albuterol. He still smokes but down to 1/3 pack per day. He is due for lung cancer screen, we will give number so he can call them directly. Order already placed in July    Atherosclerosis of Ectasy ascending aorta: reviewed last CT and no aneurysm but  ascending aorta was 3.8 cm . He sees Dr. Randie Heinz for his PVD, but patient is not sure if Dr. Randie Heinz knows about the aneurysm. He takes atorvastatin 40 mg daily  but last LDL was above goal , he wants to continue current dose until lab draw  History of Alcoholism: he is off B12 because level was high, also  off B1. He no longer drinks  but last levels were towards low end of normal again, advised to resume supplements but alternate days between B1 and B12   ED: he still has medication at home and refills at pharmacy   Impingement syndrome shoulder: going on for months  Patient Active Problem List   Diagnosis Date Noted   Cocaine use disorder, mild, in early remission (HCC) 08/27/2022   History of alcoholism (HCC) 01/24/2022   History of amputation below knee, right (HCC) 01/24/2022   Moderate protein malnutrition (HCC) 01/24/2022   Benign hypertension with chronic kidney disease, stage III (HCC) 01/24/2022   Aneurysm of ascending aorta without rupture (HCC) 01/24/2022   Stage 3a chronic kidney disease (HCC) 01/24/2022   Phantom pain after amputation of lower extremity (HCC)    Below-knee amputation of right lower extremity (HCC) 05/17/2020   Iron deficiency anemia 11/23/2018   Stage 3 chronic kidney disease (HCC) 10/21/2018   PAD (peripheral artery disease) (HCC) 07/21/2018   Claudication (HCC) 03/17/2018   B12 deficiency 03/12/2018   Vitamin B1 deficiency 03/12/2018   Varicose veins of leg with pain, right 03/12/2018   Enlarged thoracic aorta (HCC) 02/24/2018  Alcoholism /alcohol abuse 02/24/2018   Paresthesia 02/24/2018   Ectatic aorta (HCC) 02/20/2018   Emphysema lung (HCC) 02/20/2018   Atherosclerosis of aorta (HCC) 02/20/2018   Hypertension, benign 08/31/2015   Tobacco use 08/31/2015    Past Surgical History:  Procedure Laterality Date   ABDOMINAL AORTOGRAM W/LOWER EXTREMITY N/A 09/13/2019   Procedure: ABDOMINAL AORTOGRAM W/LOWER EXTREMITY;  Surgeon: Maeola Harman, MD;  Location[facility] INVASIVE CV LAB;  Service: Cardiovascular;  Laterality: N/A;   AMPUTATION Right 05/09/2020   Procedure: RIGHT BELOW KNEE AMPUTATION;  Surgeon: Maeola Harman, MD;  Location: [facility] OR;  Service: Vascular;  Laterality: Right;  w/ a block   COLONOSCOPY WITH PROPOFOL N/A 02/15/2016    Procedure: COLONOSCOPY WITH PROPOFOL;  Surgeon: [name], MD;  Location: [facility] SURGERY CNTR;  Service: Endoscopy;  Laterality: N/A;   FEMORAL-POPLITEAL BYPASS GRAFT Right 09/14/2019   Procedure: BYPASS GRAFT FEMORAL-POPLITEAL ARTERY;  Surgeon: Maeola Harman, MD;  Location: [facility] OR;  Service: Vascular;  Laterality: Right;   HERNIA REPAIR  1999   left inguinal   INSERTION OF ILIAC STENT Right 09/14/2019   Procedure: Insertion Of Common and External Iliac Stent;  Surgeon: Maeola Harman, MD;  Location: [facility] OR;  Service: Vascular;  Laterality: Right;   LOWER EXTREMITY ANGIOGRAPHY Right 05/07/2018   Procedure: LOWER EXTREMITY ANGIOGRAPHY;  Surgeon: Annice Needy, MD;  Location: [facility] INVASIVE CV LAB;  Service: Cardiovascular;  Laterality: Right;   LOWER EXTREMITY ANGIOGRAPHY Right 06/25/2018   Procedure: LOWER EXTREMITY ANGIOGRAPHY;  Surgeon: Annice Needy, MD;  Location: [facility] INVASIVE CV LAB;  Service: Cardiovascular;  Laterality: Right;   LOWER EXTREMITY ANGIOGRAPHY Right 07/01/2018   Procedure: LOWER EXTREMITY ANGIOGRAPHY;  Surgeon: Annice Needy, MD;  Location: [facility] INVASIVE CV LAB;  Service: Cardiovascular;  Laterality: Right;   LOWER EXTREMITY ANGIOGRAPHY Right 08/12/2018   Procedure: LOWER EXTREMITY ANGIOGRAPHY;  Surgeon: Annice Needy, MD;  Location: [facility] INVASIVE CV LAB;  Service: Cardiovascular;  Laterality: Right;   LOWER EXTREMITY ANGIOGRAPHY Right 09/03/2018   Procedure: LOWER EXTREMITY ANGIOGRAPHY;  Surgeon: Annice Needy, MD;  Location: [facility] INVASIVE CV LAB;  Service: Cardiovascular;  Laterality: Right;   LOWER EXTREMITY ANGIOGRAPHY Right 09/04/2018   Procedure: Lower Extremity Angiography;  Surgeon: Annice Needy, MD;  Location: [facility] INVASIVE CV LAB;  Service: Cardiovascular;  Laterality: Right;   LOWER EXTREMITY ANGIOGRAPHY Right 10/01/2018   Procedure: LOWER EXTREMITY ANGIOGRAPHY;  Surgeon: Annice Needy, MD;  Location: [facility] INVASIVE CV LAB;  Service: Cardiovascular;   Laterality: Right;   LOWER EXTREMITY ANGIOGRAPHY Right 10/01/2018   Procedure: Lower Extremity Angiography;  Surgeon: Annice Needy, MD;  Location: [facility] INVASIVE CV LAB;  Service: Cardiovascular;  Laterality: Right;   LOWER EXTREMITY ANGIOGRAPHY Right 10/15/2018   Procedure: LOWER EXTREMITY ANGIOGRAPHY;  Surgeon: Annice Needy, MD;  Location: [facility] INVASIVE CV LAB;  Service: Cardiovascular;  Laterality: Right;   LOWER EXTREMITY ANGIOGRAPHY Left 10/16/2018   Procedure: Lower Extremity Angiography;  Surgeon: Annice Needy, MD;  Location: [facility] INVASIVE CV LAB;  Service: Cardiovascular;  Laterality: Left;   LOWER EXTREMITY ANGIOGRAPHY Left 01/20/2019   Procedure: LOWER EXTREMITY ANGIOGRAPHY;  Surgeon: Annice Needy, MD;  Location: [facility] INVASIVE CV LAB;  Service: Cardiovascular;  Laterality: Left;   LOWER EXTREMITY ANGIOGRAPHY Right 01/21/2019   Procedure: Lower Extremity Angiography;  Surgeon: Annice Needy, MD;  Location: [facility] INVASIVE CV LAB;  Service: Cardiovascular;  Laterality: Right;   LOWER EXTREMITY ANGIOGRAPHY Right 01/28/2019   Procedure: LOWER  EXTREMITY ANGIOGRAPHY;  Surgeon: Annice Needy, MD;  Location: [facility] INVASIVE CV LAB;  Service: Cardiovascular;  Laterality: Right;   LOWER EXTREMITY ANGIOGRAPHY Right 01/29/2019   Procedure: Lower Extremity Angiography;  Surgeon: Annice Needy, MD;  Location: [facility] INVASIVE CV LAB;  Service: Cardiovascular;  Laterality: Right;   LOWER EXTREMITY ANGIOGRAPHY Right 04/04/2020   Procedure: LOWER EXTREMITY ANGIOGRAPHY;  Surgeon: Maeola Harman, MD;  Location: [facility] INVASIVE CV LAB;  Service: Cardiovascular;  Laterality: Right;   LOWER EXTREMITY INTERVENTION Right 07/02/2018   Procedure: LOWER EXTREMITY INTERVENTION;  Surgeon: Annice Needy, MD;  Location: [facility] INVASIVE CV LAB;  Service: Cardiovascular;  Laterality: Right;   PERIPHERAL VASCULAR BALLOON ANGIOPLASTY Left 04/04/2020   Procedure: PERIPHERAL VASCULAR BALLOON ANGIOPLASTY;  Surgeon: Maeola Harman,  MD;  Location: [facility] INVASIVE CV LAB;  Service: Cardiovascular;  Laterality: Left;  external iliac   POLYPECTOMY N/A 02/15/2016   Procedure: POLYPECTOMY;  Surgeon: [name], MD;  Location: [facility] SURGERY [facility];  Service: Endoscopy;  Laterality: N/A;    Family History  Problem Relation Age of Onset   COPD Mother    Hypertension Father    Heart attack Brother     Social History   Tobacco Use   Smoking status: Every Day    Current packs/day: 0.00    Average packs/day: 0.5 packs/day for 40.4 years (20.2 ttl pk-yrs)    Types: Cigarettes    Start date: 07/21/1978    Last attempt to quit: 12/2018    Years since quitting: 4.3    Passive exposure: Never   Smokeless tobacco: Never   Tobacco comments:    Started using cocaine recently but states he will try to quit   Substance Use Topics   Alcohol use: Not Currently    Alcohol/week: 12.0 standard drinks of alcohol    Types: 12 Cans of beer per week    Comment: 4 beers / week now per pt 05/05/20     Current Outpatient Medications:    albuterol (VENTOLIN HFA) 108 (90 Base) MCG/ACT inhaler, Inhale 2 puffs into the lungs every 6 (six) hours as needed for wheezing or shortness of breath., Disp: 18 g, Rfl: 0   ascorbic acid (VITAMIN C) 500 MG tablet, Take 1 tablet (500 mg total) by mouth daily., Disp: 30 tablet, Rfl: 0   aspirin EC 81 MG tablet, Take 1 tablet (81 mg total) by mouth daily., Disp: 90 tablet, Rfl: 3   atorvastatin (LIPITOR) 40 MG tablet, Take 1 tablet (40 mg total) by mouth daily., Disp: 90 tablet, Rfl: 1   Cholecalciferol (DIALYVITE VITAMIN D 5000) 125 MCG (5000 UT) capsule, Take 5,000 Units by mouth daily., Disp: , Rfl:    clopidogrel (PLAVIX) 75 MG tablet, Take 1 tablet (75 mg total) by mouth daily., Disp: 90 tablet, Rfl: 1   DULoxetine (CYMBALTA) 60 MG capsule, Take 1 capsule (60 mg total) by mouth daily., Disp: 90 capsule, Rfl: 1   Fluticasone-Umeclidin-Vilant (TRELEGY ELLIPTA) 100-62.5-25 MCG/ACT AEPB, Inhale 1 puff into the  lungs daily., Disp: 180 each, Rfl: 1   gabapentin (NEURONTIN) 100 MG capsule, Take 1 capsule (100 mg total) by mouth 3 (three) times daily., Disp: 270 capsule, Rfl: 1   Multiple Vitamin (MULTIVITAMIN WITH MINERALS) TABS tablet, Take 1 tablet by mouth daily., Disp: , Rfl:    pantoprazole (PROTONIX) 20 MG tablet, Take 1 tablet (20 mg total) by mouth daily., Disp: 90 tablet, Rfl: 1   tadalafil (CIALIS) 20 MG tablet, Take 0.5-1 tablets (10-20 mg  total) by mouth every other day as needed for erectile dysfunction., Disp: 10 tablet, Rfl: 2   traZODone (DESYREL) 100 MG tablet, Take 1 tablet (100 mg total) by mouth at bedtime., Disp: 90 tablet, Rfl: 1  No Known Allergies  I personally reviewed active problem list, medication list, allergies, family history, social history, health maintenance with the patient/caregiver today.   ROS  Ten systems reviewed and is negative except as mentioned in HPI    Objective  Vitals:   04/11/23 1119  BP: 122/70  Pulse: 70  Resp: 12  Temp: 97.7 F (36.5 C)  TempSrc: Oral  SpO2: 98%  Weight: 130 lb 14.4 oz (59.4 kg)    Body mass index is 19.9 kg/m.  Physical Exam  Constitutional: Patient appears well-developed and malnourished No distress.  HEENT: head atraumatic, normocephalic, pupils equal and reactive to light, neck supple Cardiovascular: Normal rate, regular rhythm and normal heart sounds.  No murmur heard. No BLE edema. Pulmonary/Chest: Effort normal and breath sounds normal. No respiratory distress. Abdominal: Soft.  There is no tenderness. Muscular skeletal: right BKA using a prosthesis , decrease rom of left shoulder  Psychiatric: Patient has a normal mood and affect. behavior is normal. Judgment and thought content normal.    PHQ2/9:    04/11/2023   11:20 AM 01/07/2023    7:40 AM 11/05/2022   11:06 AM 10/09/2022   10:55 AM 08/27/2022   11:13 AM  Depression screen PHQ 2/9  Decreased Interest 0 0 0 0 0  Down, Depressed, Hopeless 0 0 0 0 0   PHQ - 2 Score 0 0 0 0 0  Altered sleeping 0 0 0 0 0  Tired, decreased energy 0 0 0 0 0  Change in appetite 0 0 0 0 3  Feeling bad or failure about yourself  0 0 0 0 0  Trouble concentrating 0 0 0 0 0  Moving slowly or fidgety/restless 0 0 0 0 0  Suicidal thoughts 0 0 0 0 0  PHQ-9 Score 0 0 0 0 3  Difficult doing work/chores     Not difficult at all    phq 9 is negative   Fall Risk:    04/11/2023   11:20 AM 01/07/2023    7:40 AM 11/05/2022   11:06 AM 10/09/2022   10:54 AM 08/27/2022   11:13 AM  Fall Risk   Falls in the past year? 0 0 0 0 0  Number falls in past yr:  0 0 0   Injury with Fall?  0 0 0   Risk for fall due to : No Fall Risks No Fall Risks No Fall Risks No Fall Risks No Fall Risks  Follow up Falls prevention discussed Falls prevention discussed Falls prevention discussed Falls prevention discussed Falls prevention discussed      Functional Status Survey: Is the patient deaf or have difficulty hearing?: Yes Does the patient have difficulty seeing, even when wearing glasses/contacts?: No Does the patient have difficulty concentrating, remembering, or making decisions?: No Does the patient have difficulty walking or climbing stairs?: Yes Does the patient have difficulty dressing or bathing?: No Does the patient have difficulty doing errands alone such as visiting a doctor's office or shopping?: No    Assessment & Plan  1. Atherosclerosis of aorta (HCC)  On statin therapy   2. Stage 3a chronic kidney disease (HCC)  Recheck it yearly   3. Cocaine use disorder, mild, in early remission (HCC)  Doing well   4.  Other emphysema (HCC)  Needs to resume Trelegy   5. History of alcoholism (HCC)  In remission  6. Need for immunization against influenza  - Flu Vaccine Trivalent High Dose (Fluad)  7. Phantom pain after amputation of lower extremity (HCC)  Stable on medication  8. Benign hypertension with chronic kidney disease, stage III (HCC)  BP seems  to have resolved since he stopped suing cocaine but kidney function still stage III   9. Below-knee amputation of right lower extremity, sequela (HCC)  Doing well  10. Other insomnia  - traZODone (DESYREL) 100 MG tablet; Take 1 tablet (100 mg total) by mouth at bedtime.  Dispense: 90 tablet; Refill: 1  11. Impingement syndrome of left shoulder  Discussed referral to PT but he would like to try home exercises for now

## 2023-04-11 ENCOUNTER — Encounter: Payer: Self-pay | Admitting: Family Medicine

## 2023-04-11 ENCOUNTER — Ambulatory Visit: Payer: 59 | Admitting: Family Medicine

## 2023-04-11 VITALS — BP 122/70 | HR 70 | Temp 97.7°F | Resp 12 | Ht 68.0 in | Wt 130.9 lb

## 2023-04-11 DIAGNOSIS — G4709 Other insomnia: Secondary | ICD-10-CM | POA: Diagnosis not present

## 2023-04-11 DIAGNOSIS — N1831 Chronic kidney disease, stage 3a: Secondary | ICD-10-CM | POA: Diagnosis not present

## 2023-04-11 DIAGNOSIS — M7542 Impingement syndrome of left shoulder: Secondary | ICD-10-CM | POA: Diagnosis not present

## 2023-04-11 DIAGNOSIS — S88111S Complete traumatic amputation at level between knee and ankle, right lower leg, sequela: Secondary | ICD-10-CM

## 2023-04-11 DIAGNOSIS — Z23 Encounter for immunization: Secondary | ICD-10-CM | POA: Diagnosis not present

## 2023-04-11 DIAGNOSIS — F1021 Alcohol dependence, in remission: Secondary | ICD-10-CM

## 2023-04-11 DIAGNOSIS — J438 Other emphysema: Secondary | ICD-10-CM | POA: Diagnosis not present

## 2023-04-11 DIAGNOSIS — I129 Hypertensive chronic kidney disease with stage 1 through stage 4 chronic kidney disease, or unspecified chronic kidney disease: Secondary | ICD-10-CM | POA: Diagnosis not present

## 2023-04-11 DIAGNOSIS — F1411 Cocaine abuse, in remission: Secondary | ICD-10-CM | POA: Diagnosis not present

## 2023-04-11 DIAGNOSIS — I700 Atherosclerosis of aorta: Secondary | ICD-10-CM

## 2023-04-11 DIAGNOSIS — G546 Phantom limb syndrome with pain: Secondary | ICD-10-CM | POA: Diagnosis not present

## 2023-04-11 DIAGNOSIS — N183 Chronic kidney disease, stage 3 unspecified: Secondary | ICD-10-CM | POA: Diagnosis not present

## 2023-04-11 MED ORDER — TRAZODONE HCL 100 MG PO TABS
100.0000 mg | ORAL_TABLET | Freq: Every day | ORAL | 1 refills | Status: DC
Start: 2023-04-11 — End: 2023-10-06

## 2023-04-11 MED ORDER — TRELEGY ELLIPTA 100-62.5-25 MCG/ACT IN AEPB: 1.0000 | INHALATION_SPRAY | Freq: Every day | RESPIRATORY_TRACT | 1 refills | Status: DC

## 2023-04-11 MED ORDER — ALBUTEROL SULFATE HFA 108 (90 BASE) MCG/ACT IN AERS: 2.0000 | INHALATION_SPRAY | Freq: Four times a day (QID) | RESPIRATORY_TRACT | 0 refills | Status: DC | PRN

## 2023-04-11 NOTE — Patient Instructions (Signed)
[name] [phone]

## 2023-05-07 ENCOUNTER — Telehealth: Payer: Self-pay | Admitting: Family Medicine

## 2023-05-07 NOTE — Telephone Encounter (Signed)
Ryan [name] with [facility] is calling in because they are faxing paperwork over that needs to be signed and sent back by Dr. Carlynn Purl no later than EOD today. It is for pt's prosthetic supplies. Pt has an appointment tomorrow and they need the paperwork back prior to him coming in. Please advise.

## 2023-05-07 NOTE — Telephone Encounter (Signed)
When paperwork is received we will review/fill out.

## 2023-05-08 DIAGNOSIS — Z89511 Acquired absence of right leg below knee: Secondary | ICD-10-CM | POA: Diagnosis not present

## 2023-05-08 NOTE — Telephone Encounter (Signed)
Paperwork received and signed. Faxed back to [facility]

## 2023-05-12 ENCOUNTER — Telehealth: Payer: Self-pay | Admitting: Family Medicine

## 2023-05-12 NOTE — Telephone Encounter (Unsigned)
Copied from CRM [id number]. Topic: General - Inquiry >> May 12, 2023  1:46 PM Payton Doughty wrote: Standard order was faxed to [facility] regarding the patient's prosthetic.  But Meissa states they did not receive all of fax, and asking if you can re fax his order  Fax  [phone]

## 2023-05-13 NOTE — Telephone Encounter (Signed)
Form has been sent to scan center. Once scanned in to chart I will re-print and re-fax

## 2023-07-05 ENCOUNTER — Other Ambulatory Visit: Payer: Self-pay | Admitting: Family Medicine

## 2023-07-05 DIAGNOSIS — I700 Atherosclerosis of aorta: Secondary | ICD-10-CM

## 2023-07-05 DIAGNOSIS — I739 Peripheral vascular disease, unspecified: Secondary | ICD-10-CM

## 2023-07-09 ENCOUNTER — Ambulatory Visit: Payer: Self-pay

## 2023-07-09 NOTE — Telephone Encounter (Signed)
 I advised can be seen sooner unsure if it would be with PCP but pt stated he will wait until appointment day 07/15/23. Pt not having HTN sx

## 2023-07-09 NOTE — Telephone Encounter (Signed)
 Message from [place] M sent at 07/09/2023 10:36 AM EST  Summary: Pt concerned that he no longer has a Rx for blood pressure medication.   Pt has new insurance and was informed that there is no listing for blood pressure medication. Pt concerned that he no longer has a Rx for blood pressure medication. Pt requests call back to discuss. Cb# [phone]         Chief Complaint: pt asking if he is currently on a BP med. Symptoms: 172/88 Frequency: today Pertinent Negatives: Patient denies headache, dizziness, vision changes Disposition: [] ED /[] Urgent Care (no appt availability in office) / [] Appointment(In office/virtual)/ []  [facility]/ [] Home Care/ [] Refused Recommended Disposition /[] [facility]/ [x]  Follow-up with PCP Additional Notes: Valsartan  was dc'd 01/07/23 but note form 04/11/23 said he was on a ARB. Pt calling for clarification  Reason for Disposition  [1] Caller has NON-URGENT medicine question about med that PCP prescribed AND [2] triager unable to answer question  Answer Assessment - Initial Assessment Questions 1. NAME of MEDICINE: "What medicine(s) are you calling about?"     Valsartan  2. QUESTION: "What is your question?" (e.g., double dose of medicine, side effect)     Pt called and asked if he is taking anything for his BP- his insurance co. asked 3. PRESCRIBER: "Who prescribed the medicine?" Reason: if prescribed by specialist, call should be referred to that group.     Dr. Ava Lei 4. SYMPTOMS: "Do you have any symptoms?" If Yes, ask: "What symptoms are you having?"  "How bad are the symptoms (e.g., mild, moderate, severe)     BP 172/88 no dizziness, headache or vision problems  Protocols used: Medication Question Call-A-AH

## 2023-07-09 NOTE — Telephone Encounter (Signed)
 Pt verbalized understanding, he states he feels perfectly fine only that House call nurse came by and happened to have BP reading high.

## 2023-07-14 ENCOUNTER — Ambulatory Visit: Payer: 59 | Admitting: Family Medicine

## 2023-07-15 ENCOUNTER — Encounter: Payer: Self-pay | Admitting: Family Medicine

## 2023-07-15 ENCOUNTER — Ambulatory Visit: Payer: 59 | Admitting: Family Medicine

## 2023-07-15 VITALS — BP 152/82 | HR 88 | Temp 97.7°F | Resp 16 | Ht 68.0 in | Wt 139.4 lb

## 2023-07-15 DIAGNOSIS — F1411 Cocaine abuse, in remission: Secondary | ICD-10-CM

## 2023-07-15 DIAGNOSIS — N1831 Chronic kidney disease, stage 3a: Secondary | ICD-10-CM

## 2023-07-15 DIAGNOSIS — J438 Other emphysema: Secondary | ICD-10-CM | POA: Diagnosis not present

## 2023-07-15 DIAGNOSIS — I700 Atherosclerosis of aorta: Secondary | ICD-10-CM

## 2023-07-15 DIAGNOSIS — G4709 Other insomnia: Secondary | ICD-10-CM | POA: Diagnosis not present

## 2023-07-15 DIAGNOSIS — Z1211 Encounter for screening for malignant neoplasm of colon: Secondary | ICD-10-CM

## 2023-07-15 DIAGNOSIS — I7789 Other specified disorders of arteries and arterioles: Secondary | ICD-10-CM | POA: Diagnosis not present

## 2023-07-15 DIAGNOSIS — I739 Peripheral vascular disease, unspecified: Secondary | ICD-10-CM | POA: Diagnosis not present

## 2023-07-15 DIAGNOSIS — I129 Hypertensive chronic kidney disease with stage 1 through stage 4 chronic kidney disease, or unspecified chronic kidney disease: Secondary | ICD-10-CM

## 2023-07-15 DIAGNOSIS — L308 Other specified dermatitis: Secondary | ICD-10-CM

## 2023-07-15 DIAGNOSIS — G546 Phantom limb syndrome with pain: Secondary | ICD-10-CM | POA: Diagnosis not present

## 2023-07-15 DIAGNOSIS — Z89511 Acquired absence of right leg below knee: Secondary | ICD-10-CM

## 2023-07-15 DIAGNOSIS — F1020 Alcohol dependence, uncomplicated: Secondary | ICD-10-CM

## 2023-07-15 MED ORDER — VALSARTAN 80 MG PO TABS: 80.0000 mg | ORAL_TABLET | Freq: Every day | ORAL | 0 refills | Status: DC

## 2023-07-15 MED ORDER — HYDRALAZINE HCL 10 MG PO TABS: 10.0000 mg | ORAL_TABLET | Freq: Three times a day (TID) | ORAL | 0 refills | Status: DC

## 2023-07-15 MED ORDER — TRIAMCINOLONE ACETONIDE 0.1 % EX CREA: 1.0000 | TOPICAL_CREAM | Freq: Two times a day (BID) | CUTANEOUS | 0 refills | Status: DC

## 2023-07-15 NOTE — Patient Instructions (Signed)
[facility] [phone]

## 2023-07-15 NOTE — Progress Notes (Signed)
Name: Ryan [name]   MRN: [id number]    DOB: February 20, 1956  Date:07/15/2023       Progress Note  Subjective  Chief Complaint  Chief Complaint  Patient presents with   Medical Management of Chronic Issues   HPI   HTN/CKI stage III a: He was on ARB. 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 He also takes Duloxetine   Dysthymia: States he is doing "good", taking Duloxetine and denies side effects . Denies sadness or depressed mood today. Doing well on current regiment    Insomnia: taking Trazodone 100 mg every night and is able to fall asleep around midnight and wakes up around 6 am. Discussed sleep hygiene   Emphysema: he is using Trelegy again but only prn. He still smokes but down to 1/3  pack per day.  Order already placed in July and he needs to call and schedule    Atherosclerosis of Ectasy ascending aorta: reviewed last CT and no aneurysm but  ascending aorta was 4.1  cm . He takes atorvastatin 40 mg daily  but last LDL was above goal , I will place a referral to Dr. Randie Heinz   Alcoholism: he used to be a heavy drinker, currently drinking about half bottle of wine a couple of weeks, he has been taking B12 and B1 supplements now  Eczema: dry patches on both hands and outer legs, itching and dry    ED: he still has medication at home and refills at pharmacy    Patient Active Problem List   Diagnosis Date Noted   Cocaine use disorder, mild, in early remission (HCC) 08/27/2022   History of alcoholism (HCC) 01/24/2022   History of amputation below knee, right (HCC) 01/24/2022   Moderate  protein malnutrition (HCC) 01/24/2022   Benign hypertension with chronic kidney disease, stage III (HCC) 01/24/2022   Aneurysm of ascending aorta without rupture (HCC) 01/24/2022   Stage 3a chronic kidney disease (HCC) 01/24/2022   Phantom pain after amputation of lower extremity (HCC)    Below-knee amputation of right lower extremity (HCC) 05/17/2020   Iron deficiency anemia 11/23/2018   Stage 3 chronic kidney disease (HCC) 10/21/2018   PAD (peripheral artery disease) (HCC) 07/21/2018   Claudication (HCC) 03/17/2018   B12 deficiency 03/12/2018   Vitamin B1 deficiency 03/12/2018   Varicose veins of leg with pain, right 03/12/2018   Enlarged thoracic aorta (HCC) 02/24/2018   Alcoholism /alcohol abuse 02/24/2018   Paresthesia 02/24/2018   Ectatic aorta (HCC) 02/20/2018   Emphysema lung (HCC) 02/20/2018   Atherosclerosis of aorta (HCC) 02/20/2018   Hypertension, benign 08/31/2015   Tobacco use 08/31/2015    Past Surgical History:  Procedure Laterality Date   ABDOMINAL AORTOGRAM W/LOWER EXTREMITY N/A 09/13/2019   Procedure: ABDOMINAL AORTOGRAM W/LOWER EXTREMITY;  Surgeon: Maeola Harman, MD;  Location: [facility] INVASIVE CV LAB;  Service: Cardiovascular;  Laterality: N/A;   AMPUTATION Right 05/09/2020   Procedure: RIGHT BELOW KNEE AMPUTATION;  Surgeon: Maeola Harman, MD;  Location: [facility] OR;  Service: Vascular;  Laterality: Right;  w/  a block   COLONOSCOPY WITH PROPOFOL N/A 02/15/2016   Procedure: COLONOSCOPY WITH PROPOFOL;  Surgeon: [name], MD;  Location: [facility]ERY [facility];  Service: Endoscopy;  Laterality: N/A;   FEMORAL-POPLITEAL BYPASS GRAFT Right 09/14/2019   Procedure: BYPASS GRAFT FEMORAL-POPLITEAL ARTERY;  Surgeon: Maeola Harman, MD;  Location: [facility] OR;  Service: Vascular;  Laterality: Right;   HERNIA REPAIR  1999   left inguinal   INSERTION OF ILIAC STENT Right 09/14/2019   Procedure: Insertion Of Common and External Iliac Stent;  Surgeon: Maeola Harman, MD;  Location: [facility] OR;  Service: Vascular;  Laterality: Right;   LOWER EXTREMITY ANGIOGRAPHY Right 05/07/2018   Procedure: LOWER EXTREMITY ANGIOGRAPHY;  Surgeon: Annice Needy, MD;  Location: [facility] INVASIVE CV LAB;  Service: Cardiovascular;  Laterality: Right;   LOWER EXTREMITY ANGIOGRAPHY Right 06/25/2018   Procedure: LOWER EXTREMITY ANGIOGRAPHY;  Surgeon: Annice Needy, MD;  Location: [facility] INVASIVE CV LAB;  Service: Cardiovascular;  Laterality: Right;   LOWER EXTREMITY ANGIOGRAPHY Right 07/01/2018   Procedure: LOWER EXTREMITY ANGIOGRAPHY;  Surgeon: Annice Needy, MD;  Location: [facility] INVASIVE CV LAB;  Service: Cardiovascular;  Laterality: Right;   LOWER EXTREMITY ANGIOGRAPHY Right 08/12/2018   Procedure: LOWER EXTREMITY ANGIOGRAPHY;  Surgeon: Annice Needy, MD;  Location: [facility] INVASIVE CV LAB;  Service: Cardiovascular;  Laterality: Right;   LOWER EXTREMITY ANGIOGRAPHY Right 09/03/2018   Procedure: LOWER EXTREMITY ANGIOGRAPHY;  Surgeon: Annice Needy, MD;  Location: [facility] INVASIVE CV LAB;  Service: Cardiovascular;  Laterality: Right;   LOWER EXTREMITY ANGIOGRAPHY Right 09/04/2018   Procedure: Lower Extremity Angiography;  Surgeon: Annice Needy, MD;  Location: [facility] INVASIVE CV LAB;  Service: Cardiovascular;  Laterality: Right;   LOWER EXTREMITY ANGIOGRAPHY Right 10/01/2018   Procedure: LOWER EXTREMITY ANGIOGRAPHY;  Surgeon: Annice Needy, MD;  Location: [facility] INVASIVE CV LAB;  Service: Cardiovascular;  Laterality: Right;   LOWER EXTREMITY ANGIOGRAPHY Right 10/01/2018   Procedure: Lower Extremity Angiography;  Surgeon: Annice Needy, MD;  Location: [facility] INVASIVE CV LAB;  Service: Cardiovascular;  Laterality: Right;   LOWER EXTREMITY ANGIOGRAPHY Right 10/15/2018   Procedure: LOWER EXTREMITY ANGIOGRAPHY;  Surgeon: Annice Needy, MD;  Location: [facility] INVASIVE CV LAB;  Service: Cardiovascular;  Laterality: Right;   LOWER EXTREMITY ANGIOGRAPHY Left 10/16/2018   Procedure: Lower Extremity Angiography;   Surgeon: Annice Needy, MD;  Location: [facility] INVASIVE CV LAB;  Service: Cardiovascular;  Laterality: Left;   LOWER EXTREMITY ANGIOGRAPHY Left 01/20/2019   Procedure: LOWER EXTREMITY ANGIOGRAPHY;  Surgeon: Annice Needy, MD;  Location: [facility] INVASIVE CV LAB;  Service: Cardiovascular;  Laterality: Left;   LOWER EXTREMITY ANGIOGRAPHY Right 01/21/2019   Procedure: Lower Extremity Angiography;  Surgeon: Annice Needy, MD;  Location: [facility] INVASIVE CV LAB;  Service: Cardiovascular;  Laterality: Right;   LOWER EXTREMITY ANGIOGRAPHY Right 01/28/2019   Procedure: LOWER EXTREMITY ANGIOGRAPHY;  Surgeon: Annice Needy, MD;  Location: [facility] INVASIVE CV LAB;  Service: Cardiovascular;  Laterality: Right;   LOWER EXTREMITY ANGIOGRAPHY Right 01/29/2019   Procedure: Lower Extremity Angiography;  Surgeon: Annice Needy, MD;  Location: [facility] INVASIVE CV LAB;  Service: Cardiovascular;  Laterality: Right;   LOWER EXTREMITY ANGIOGRAPHY Right 04/04/2020   Procedure: LOWER EXTREMITY ANGIOGRAPHY;  Surgeon: Maeola Harman, MD;  Location: [facility] INVASIVE CV LAB;  Service: Cardiovascular;  Laterality: Right;   LOWER EXTREMITY INTERVENTION Right 07/02/2018   Procedure: LOWER EXTREMITY INTERVENTION;  Surgeon: Annice Needy, MD;  Location: [facility] INVASIVE CV LAB;  Service: Cardiovascular;  Laterality: Right;   PERIPHERAL VASCULAR BALLOON ANGIOPLASTY Left 04/04/2020   Procedure: PERIPHERAL VASCULAR BALLOON ANGIOPLASTY;  Surgeon: Maeola Harman, MD;  Location: [facility] INVASIVE CV LAB;  Service: Cardiovascular;  Laterality: Left;  external iliac   POLYPECTOMY N/A 02/15/2016   Procedure: POLYPECTOMY;  Surgeon: [name], MD;  Location: [facility];  Service: Endoscopy;  Laterality: N/A;    Family History  Problem Relation Age of Onset   COPD Mother    Hypertension Father    Heart attack Brother     Social History   Tobacco Use   Smoking status: Every Day    Current packs/day: 0.00    Average packs/day: 0.5 packs/day for  40.4 years (20.2 ttl pk-yrs)    Types: Cigarettes    Start date: 07/21/1978    Last attempt to quit: 12/2018    Years since quitting: 4.5    Passive exposure: Never   Smokeless tobacco: Never   Tobacco comments:    Started using cocaine recently but states he will try to quit   Substance Use Topics   Alcohol use: Not Currently    Alcohol/week: 12.0 standard drinks of alcohol    Types: 12 Cans of beer per week    Comment: 4 beers / week now per pt 05/05/20     Current Outpatient Medications:    albuterol (VENTOLIN HFA) 108 (90 Base) MCG/ACT inhaler, Inhale 2 puffs into the lungs every 6 (six) hours as needed for wheezing or shortness of breath., Disp: 18 g, Rfl: 0   ascorbic acid (VITAMIN C) 500 MG tablet, Take 1 tablet (500 mg total) by mouth daily., Disp: 30 tablet, Rfl: 0   aspirin EC 81 MG tablet, Take 1 tablet (81 mg total) by mouth daily., Disp: 90 tablet, Rfl: 3   atorvastatin (LIPITOR) 40 MG tablet, Take 1 tablet by mouth once daily, Disp: 90 tablet, Rfl: 0   Cholecalciferol (DIALYVITE VITAMIN D 5000) 125 MCG (5000 UT) capsule, Take 5,000 Units by mouth daily., Disp: , Rfl:    clopidogrel (PLAVIX) 75 MG tablet, Take 1 tablet (75 mg total) by mouth daily., Disp: 90 tablet, Rfl: 1   DULoxetine (CYMBALTA) 60 MG capsule, Take 1 capsule (60 mg total) by mouth daily., Disp: 90 capsule, Rfl: 1   Fluticasone-Umeclidin-Vilant (TRELEGY ELLIPTA) 100-62.5-25 MCG/ACT AEPB, Inhale 1 puff into the lungs daily., Disp: 180 each, Rfl: 1   gabapentin (NEURONTIN) 100 MG capsule, Take 1 capsule (100 mg total) by mouth 3 (three) times daily., Disp: 270 capsule, Rfl: 1   Multiple Vitamin (MULTIVITAMIN WITH MINERALS) TABS tablet, Take 1 tablet by mouth daily., Disp: , Rfl:    pantoprazole (PROTONIX) 20 MG tablet, Take 1 tablet (20 mg total) by mouth daily., Disp: 90 tablet, Rfl: 1   tadalafil (CIALIS) 20 MG tablet, Take 0.5-1 tablets (10-20 mg total) by mouth every other day as needed for erectile  dysfunction., Disp: 10 tablet, Rfl: 2   traZODone (DESYREL) 100 MG tablet, Take 1 tablet (100 mg total) by mouth at bedtime., Disp: 90 tablet, Rfl: 1  No Known Allergies  I personally reviewed active problem list, medication list, allergies with the patient/caregiver today.   ROS  Ten systems reviewed and is negative except as mentioned in HPI    Objective  Vitals:   07/15/23 0909  BP: (!) 152/84  Pulse: 88  Resp: 16  Temp: 97.7 F (36.5 C)  TempSrc: Oral  SpO2: 98%  Weight: 139 lb 6.4 oz (63.2 kg)  Height:  5\' 8"  (1.727 m)    Body mass index is 21.2 kg/m.  Physical Exam  Constitutional: Patient appears well-developed and well-nourished.  No distress.  HEENT: head atraumatic, normocephalic, pupils equal and reactive to light, neck supple Cardiovascular: Normal rate, regular rhythm and normal heart sounds.  No murmur heard. No BLE edema. Pulmonary/Chest: Effort normal and breath sounds normal. No respiratory distress. Abdominal: Soft.  There is no tenderness. Psychiatric: Patient has a normal mood and affect. behavior is normal. Judgment and thought content normal.   Diabetic Foot Exam:     PHQ2/9:    07/15/2023    9:04 AM 04/11/2023   11:20 AM 01/07/2023    7:40 AM 11/05/2022   11:06 AM 10/09/2022   10:55 AM  Depression screen PHQ 2/9  Decreased Interest 0 0 0 0 0  Down, Depressed, Hopeless 0 0 0 0 0  PHQ - 2 Score 0 0 0 0 0  Altered sleeping 0 0 0 0 0  Tired, decreased energy 0 0 0 0 0  Change in appetite 0 0 0 0 0  Feeling bad or failure about yourself  0 0 0 0 0  Trouble concentrating 0 0 0 0 0  Moving slowly or fidgety/restless 0 0 0 0 0  Suicidal thoughts 0 0 0 0 0  PHQ-9 Score 0 0 0 0 0  Difficult doing work/chores Not difficult at all        phq 9 is negative  Fall Risk:    07/15/2023    9:04 AM 04/11/2023   11:20 AM 01/07/2023    7:40 AM 11/05/2022   11:06 AM 10/09/2022   10:54 AM  Fall Risk   Falls in the past year? 0 0 0 0 0  Number falls  in past yr: 0  0 0 0  Injury with Fall? 0  0 0 0  Risk for fall due to : No Fall Risks No Fall Risks No Fall Risks No Fall Risks No Fall Risks  Follow up Falls prevention discussed;Education provided;Falls evaluation completed Falls prevention discussed Falls prevention discussed Falls prevention discussed Falls prevention discussed     Assessment and Plan    Hypertension Fluctuating blood pressure readings, possibly related to substance use and alcohol intake. Blood pressure spikes with alcohol and cocaine use, but is generally normal when abstinent. -Start Valsartan 80mg  daily for blood pressure control and kidney protection. -Provide Hydralazine for as-needed use when blood pressure is consistently above 140/90 after two readings. -Encourage patient to monitor blood pressure at home and maintain sobriety.  Chronic Kidney Disease (Stage 3A) Mildly reduced kidney function. -Start Valsartan 80mg  daily for kidney protection.  Eczema Dry, itchy skin on hands and legs. -Prescribe Triamcinolone cream to mix with patient's cocoa butter lotion for application on affected areas.  Alcohol Use Moderate alcohol intake, approximately half a bottle of wine twice a week. -Encourage reduction or cessation of alcohol intake due to its impact on blood pressure.  Cocaine Use Disorder In sustained remission for approximately 7-8 months. -Encourage continued abstinence due to impact on blood pressure.  Emphysema Patient on Trelegy as needed, reports occasional shortness of breath with minimal activity. -Continue Trelegy as needed.  Below Knee Amputation (Right) Phantom pain managed with Gabapentin and Duloxetine. -Continue Gabapentin and Duloxetine as needed for phantom pain.  Peripheral Artery Disease/Atherosclerosis of Aorta History of below knee amputation on the right. -Refer to Dr. Henry Russel for monitoring.  Enlarged Thoracic Aorta Aorta slightly larger than normal, no symptoms  reported. -Refer to Dr. Randie Heinz  for monitoring.  Vitamin B12 and B1 Deficiency Patient on supplements. -Continue B12 and B1 supplements.  Insomnia Patient reports waking up early but is able to fall back asleep. -Continue Trazodone for sleep.  General Health Maintenance -Refer for colon cancer screening. -Refer for lung cancer screening. -Check blood work in April 2025.

## 2023-07-16 ENCOUNTER — Other Ambulatory Visit: Payer: Self-pay | Admitting: Family Medicine

## 2023-07-16 DIAGNOSIS — I7789 Other specified disorders of arteries and arterioles: Secondary | ICD-10-CM

## 2023-07-17 ENCOUNTER — Telehealth: Payer: Self-pay

## 2023-07-17 ENCOUNTER — Other Ambulatory Visit: Payer: Self-pay

## 2023-07-17 DIAGNOSIS — Z860100 Personal history of colon polyps, unspecified: Secondary | ICD-10-CM

## 2023-07-17 NOTE — Telephone Encounter (Signed)
Gastroenterology Pre-Procedure Review  Request Date: TBD Requesting Physician: Dr. Servando Snare  PATIENT REVIEW QUESTIONS: The patient responded to the following health history questions as indicated:    1. Are you having any GI issues? no 2. Do you have a personal history of Polyps? yes (last colonoscopy performed by Dr. Servando Snare 02/15/16) 3. Do you have a family history of Colon Cancer or Polyps? no 4. Diabetes Mellitus? no 5. Joint replacements in the past 12 months?no 6. Major health problems in the past 3 months?no 7. Any artificial heart valves, MVP, or defibrillator?no 8. Cardiac? YES Enlarged Thoracic Aorta (Colonoscopy TBD after testing)    MEDICATIONS & ALLERGIES:    Patient reports the following regarding taking any anticoagulation/antiplatelet therapy:   Plavix, Coumadin, Eliquis, Xarelto, Lovenox, Pradaxa, Brilinta, or Effient? yes (Dr. Carlynn Purl advised last year to stop 2 days prior to colonoscopy) Aspirin? yes (81mg  daily)  Patient confirms/reports the following medications:  Current Outpatient Medications  Medication Sig Dispense Refill   albuterol (VENTOLIN HFA) 108 (90 Base) MCG/ACT inhaler Inhale 2 puffs into the lungs every 6 (six) hours as needed for wheezing or shortness of breath. 18 g 0   ascorbic acid (VITAMIN C) 500 MG tablet Take 1 tablet (500 mg total) by mouth daily. 30 tablet 0   aspirin EC 81 MG tablet Take 1 tablet (81 mg total) by mouth daily. 90 tablet 3   atorvastatin (LIPITOR) 40 MG tablet Take 1 tablet by mouth once daily 90 tablet 0   Cholecalciferol (DIALYVITE VITAMIN D 5000) 125 MCG (5000 UT) capsule Take 5,000 Units by mouth daily.     clopidogrel (PLAVIX) 75 MG tablet Take 1 tablet (75 mg total) by mouth daily. 90 tablet 1   gabapentin (NEURONTIN) 100 MG capsule Take 1 capsule (100 mg total) by mouth 3 (three) times daily. 270 capsule 1   traZODone (DESYREL) 100 MG tablet Take 1 tablet (100 mg total) by mouth at bedtime. 90 tablet 1   triamcinolone cream  (KENALOG) 0.1 % Apply 1 Application topically 2 (two) times daily. 454 g 0   valsartan (DIOVAN) 80 MG tablet Take 1 tablet (80 mg total) by mouth daily. BP and kidney disease 90 tablet 0   DULoxetine (CYMBALTA) 60 MG capsule Take 1 capsule (60 mg total) by mouth daily. (Patient not taking: Reported on 07/17/2023) 90 capsule 1   Fluticasone-Umeclidin-Vilant (TRELEGY ELLIPTA) 100-62.5-25 MCG/ACT AEPB Inhale 1 puff into the lungs daily. (Patient not taking: Reported on 07/17/2023) 180 each 1   hydrALAZINE (APRESOLINE) 10 MG tablet Take 1 tablet (10 mg total) by mouth 3 (three) times daily. (Patient not taking: Reported on 07/17/2023) 90 tablet 0   Multiple Vitamin (MULTIVITAMIN WITH MINERALS) TABS tablet Take 1 tablet by mouth daily. (Patient not taking: Reported on 07/17/2023)     tadalafil (CIALIS) 20 MG tablet Take 0.5-1 tablets (10-20 mg total) by mouth every other day as needed for erectile dysfunction. (Patient not taking: Reported on 07/17/2023) 10 tablet 2   No current facility-administered medications for this visit.    Patient confirms/reports the following allergies:  No Known Allergies  No orders of the defined types were placed in this encounter.   AUTHORIZATION INFORMATION Primary Insurance: 1D#: Group #:  Secondary Insurance: 1D#: Group #:  SCHEDULE INFORMATION: Date:  Time: Location: [facility]

## 2023-07-30 ENCOUNTER — Ambulatory Visit
Admission: RE | Admit: 2023-07-30 | Discharge: 2023-07-30 | Disposition: A | Discharge status: Home / Self Care | Payer: 59 | Source: Ambulatory Visit | Attending: Family Medicine | Admitting: Family Medicine

## 2023-07-30 ENCOUNTER — Other Ambulatory Visit: Payer: Self-pay | Admitting: Family Medicine

## 2023-07-30 DIAGNOSIS — R918 Other nonspecific abnormal finding of lung field: Secondary | ICD-10-CM | POA: Diagnosis not present

## 2023-07-30 DIAGNOSIS — J439 Emphysema, unspecified: Secondary | ICD-10-CM | POA: Diagnosis not present

## 2023-07-30 DIAGNOSIS — I7789 Other specified disorders of arteries and arterioles: Secondary | ICD-10-CM | POA: Diagnosis not present

## 2023-07-30 DIAGNOSIS — I700 Atherosclerosis of aorta: Secondary | ICD-10-CM | POA: Diagnosis not present

## 2023-07-30 DIAGNOSIS — J90 Pleural effusion, not elsewhere classified: Secondary | ICD-10-CM | POA: Diagnosis not present

## 2023-07-30 LAB — POCT I-STAT CREATININE: Creatinine, Ser: 1.6 mg/dL — ABNORMAL HIGH (ref 0.61–1.24)

## 2023-07-30 MED ORDER — IOHEXOL 350 MG/ML SOLN
75.0000 mL | Freq: Once | INTRAVENOUS | Status: AC | PRN
  Administered 2023-07-30: 75 mL via INTRAVENOUS

## 2023-08-04 ENCOUNTER — Telehealth: Payer: Self-pay

## 2023-08-04 NOTE — Telephone Encounter (Signed)
 Left message on voicemail asking pt to r/t my call to r/s upcoming procedure of 2/13 and see if he can move to 2/14

## 2023-08-05 NOTE — Telephone Encounter (Signed)
Pt has been r/s from 2/13 to 2/14. I called endo to have pt moved

## 2023-08-05 NOTE — Telephone Encounter (Signed)
Left message on voicemail asking to r/t my call to r/s procedure for 2/13

## 2023-08-07 ENCOUNTER — Encounter: Payer: Self-pay | Admitting: Gastroenterology

## 2023-08-08 ENCOUNTER — Ambulatory Visit: Payer: 59 | Admitting: Anesthesiology

## 2023-08-08 ENCOUNTER — Other Ambulatory Visit: Payer: Self-pay

## 2023-08-08 ENCOUNTER — Encounter: Payer: Self-pay | Admitting: Gastroenterology

## 2023-08-08 ENCOUNTER — Ambulatory Visit
Admission: RE | Admit: 2023-08-08 | Discharge: 2023-08-08 | Disposition: A | Discharge status: Home / Self Care | Payer: 59 | Attending: Gastroenterology | Admitting: Gastroenterology

## 2023-08-08 ENCOUNTER — Encounter
Admission: RE | Disposition: A | Discharge status: Home / Self Care | Payer: Self-pay | Source: Home / Self Care | Attending: Gastroenterology

## 2023-08-08 DIAGNOSIS — F17210 Nicotine dependence, cigarettes, uncomplicated: Secondary | ICD-10-CM | POA: Diagnosis not present

## 2023-08-08 DIAGNOSIS — N189 Chronic kidney disease, unspecified: Secondary | ICD-10-CM | POA: Insufficient documentation

## 2023-08-08 DIAGNOSIS — Z1211 Encounter for screening for malignant neoplasm of colon: Secondary | ICD-10-CM | POA: Insufficient documentation

## 2023-08-08 DIAGNOSIS — D125 Benign neoplasm of sigmoid colon: Secondary | ICD-10-CM | POA: Diagnosis not present

## 2023-08-08 DIAGNOSIS — I129 Hypertensive chronic kidney disease with stage 1 through stage 4 chronic kidney disease, or unspecified chronic kidney disease: Secondary | ICD-10-CM | POA: Insufficient documentation

## 2023-08-08 DIAGNOSIS — K635 Polyp of colon: Secondary | ICD-10-CM | POA: Diagnosis not present

## 2023-08-08 DIAGNOSIS — Z860100 Personal history of colon polyps, unspecified: Secondary | ICD-10-CM | POA: Diagnosis not present

## 2023-08-08 DIAGNOSIS — D124 Benign neoplasm of descending colon: Secondary | ICD-10-CM | POA: Insufficient documentation

## 2023-08-08 DIAGNOSIS — D120 Benign neoplasm of cecum: Secondary | ICD-10-CM | POA: Diagnosis not present

## 2023-08-08 DIAGNOSIS — I7121 Aneurysm of the ascending aorta, without rupture: Secondary | ICD-10-CM | POA: Diagnosis not present

## 2023-08-08 HISTORY — PX: POLYPECTOMY: SHX149

## 2023-08-08 HISTORY — PX: COLONOSCOPY WITH PROPOFOL: SHX5780

## 2023-08-08 SURGERY — COLONOSCOPY WITH PROPOFOL
Anesthesia: General

## 2023-08-08 MED ORDER — LIDOCAINE HCL (CARDIAC) PF 100 MG/5ML IV SOSY
PREFILLED_SYRINGE | INTRAVENOUS | Status: DC | PRN
  Administered 2023-08-08: 60 mg via INTRAVENOUS

## 2023-08-08 MED ORDER — PROPOFOL 10 MG/ML IV BOLUS
INTRAVENOUS | Status: DC | PRN
  Administered 2023-08-08: 30 mg via INTRAVENOUS

## 2023-08-08 MED ORDER — SODIUM CHLORIDE 0.9% FLUSH
3.0000 mL | Freq: Two times a day (BID) | INTRAVENOUS | Status: DC
Start: 2023-08-08 — End: 2023-08-08
  Administered 2023-08-08: 10 mL via INTRAVENOUS

## 2023-08-08 MED ORDER — PROPOFOL 500 MG/50ML IV EMUL
INTRAVENOUS | Status: DC | PRN
  Administered 2023-08-08: 75 ug/kg/min via INTRAVENOUS

## 2023-08-08 MED ORDER — DEXMEDETOMIDINE HCL IN NACL 80 MCG/20ML IV SOLN
INTRAVENOUS | Status: DC | PRN
  Administered 2023-08-08: 12 ug via INTRAVENOUS

## 2023-08-08 MED ORDER — SODIUM CHLORIDE 0.9 % IV SOLN: INTRAVENOUS | Status: DC | PRN

## 2023-08-08 MED ORDER — SODIUM CHLORIDE 0.9% FLUSH: 3.0000 mL | INTRAVENOUS | Status: DC | PRN

## 2023-08-08 MED ORDER — LIDOCAINE HCL (PF) 2 % IJ SOLN
INTRAMUSCULAR | Status: AC
  Filled 2023-08-08: qty 5

## 2023-08-08 NOTE — Anesthesia Preprocedure Evaluation (Signed)
Anesthesia Evaluation  Patient identified by MRN, date of birth, ID band Patient awake    Reviewed: Allergy & Precautions, H&P , NPO status , Patient's Chart, lab work & pertinent test results, reviewed documented beta blocker date and time   History of Anesthesia Complications Negative for: history of anesthetic complications  Airway Mallampati: I  TM Distance: >3 FB Neck ROM: full    Dental  (+) Dental Advidsory Given, Edentulous Upper, Missing, Poor Dentition   Pulmonary shortness of breath and with exertion, neg sleep apnea, COPD, Recent URI , Residual Cough, Current Smoker   Pulmonary exam normal breath sounds clear to auscultation       Cardiovascular Exercise Tolerance: Good hypertension, (-) angina (-) Past MI and (-) Cardiac Stents negative cardio ROS Normal cardiovascular exam(-) dysrhythmias (-) Valvular Problems/Murmurs Rhythm:regular Rate:Normal     Neuro/Psych negative neurological ROS  negative psych ROS   GI/Hepatic negative GI ROS, Neg liver ROS,,,  Endo/Other  negative endocrine ROS    Renal/GU CRFRenal disease  negative genitourinary   Musculoskeletal   Abdominal   Peds  Hematology negative hematology ROS (+)   Anesthesia Other Findings Past Medical History: No date: CKD (chronic kidney disease) No date: History of blood transfusion No date: Hyperlipidemia No date: Hypertension No date: Neuromuscular disorder (HCC)     Comment:  numbness feet No date: Peripheral vascular disease (HCC) No date: Shortness of breath dyspnea     Comment:  occasional    Reproductive/Obstetrics negative OB ROS                             Anesthesia Physical Anesthesia Plan  ASA: 3  Anesthesia Plan: General   Post-op Pain Management:    Induction: Intravenous  PONV Risk Score and Plan: 1 and Propofol infusion and TIVA  Airway Management Planned: Natural Airway and Nasal  Cannula  Additional Equipment:   Intra-op Plan:   Post-operative Plan:   Informed Consent: I have reviewed the patients History and Physical, chart, labs and discussed the procedure including the risks, benefits and alternatives for the proposed anesthesia with the patient or authorized representative who has indicated his/her understanding and acceptance.     Dental Advisory Given  Plan Discussed with: Anesthesiologist, CRNA and Surgeon  Anesthesia Plan Comments:        Anesthesia Quick Evaluation

## 2023-08-08 NOTE — Transfer of Care (Signed)
Immediate Anesthesia Transfer of Care Note  Patient: Ryan [name]  Procedure(s) Performed: COLONOSCOPY WITH PROPOFOL POLYPECTOMY INTESTINAL  Patient Location: PACU  Anesthesia Type:General  Level of Consciousness: sedated  Airway & Oxygen Therapy: Patient Spontanous Breathing  Post-op Assessment: Report given to RN and Post -op Vital signs reviewed and stable  Post vital signs: Reviewed and stable  Last Vitals:  Vitals Value Taken Time  BP 80/55 08/08/23 1010  Temp    Pulse 58 08/08/23 1012  Resp    SpO2 99 % 08/08/23 1012  Vitals shown include unfiled device data.  Last Pain:  Vitals:   08/08/23 1010  TempSrc:   PainSc: Asleep         Complications: No notable events documented.

## 2023-08-08 NOTE — Op Note (Signed)
[facility] Gastroenterology Patient Name: Ryan [name] Procedure Date: 08/08/2023 9:35 AM MRN: [id number] Account #: [id number] Date of Birth: March 22, 1956 Admit Type: Outpatient Age: 68 Room: [facility] ENDO ROOM 4 Gender: Male Note Status: Finalized Instrument Name: Prentice Docker [id number] Procedure:             Colonoscopy Indications:           High risk colon cancer surveillance: Personal history                         of colonic polyps Providers:             [name] MD, MD Referring MD:          Onnie Boer. Sowles, MD (Referring MD) Medicines:             Propofol per Anesthesia Complications:         No immediate complications. Procedure:             Pre-Anesthesia Assessment:                        - Prior to the procedure, a History and Physical was                         performed, and patient medications and allergies were                         reviewed. The patient's tolerance of previous                         anesthesia was also reviewed. The risks and benefits                         of the procedure and the sedation options and risks                         were discussed with the patient. All questions were                         answered, and informed consent was obtained. Prior                         Anticoagulants: The patient has taken no anticoagulant                         or antiplatelet agents. ASA Grade Assessment: II - A                         patient with mild systemic disease. After reviewing                         the risks and benefits, the patient was deemed in                         satisfactory condition to undergo the procedure.                        After obtaining informed consent, the colonoscope was  passed under direct vision. Throughout the procedure,                         the patient's blood pressure, pulse, and oxygen                         saturations were monitored continuously. The                          Colonoscope was introduced through the anus and                         advanced to the the cecum, identified by appendiceal                         orifice and ileocecal valve. The colonoscopy was                         performed without difficulty. The patient tolerated                         the procedure well. The quality of the bowel                         preparation was excellent. Findings:      The perianal and digital rectal examinations were normal.      A 4 mm polyp was found in the sigmoid colon. The polyp was sessile. The       polyp was removed with a cold snare. Resection and retrieval were       complete.      A 2 mm polyp was found in the cecum. The polyp was sessile. The polyp       was removed with a cold biopsy forceps. Resection and retrieval were       complete.      A 3 mm polyp was found in the descending colon. The polyp was sessile.       The polyp was removed with a cold biopsy forceps. Resection and       retrieval were complete. Impression:            - One 4 mm polyp in the sigmoid colon, removed with a                         cold snare. Resected and retrieved.                        - One 2 mm polyp in the cecum, removed with a cold                         biopsy forceps. Resected and retrieved.                        - One 3 mm polyp in the descending colon, removed with                         a cold biopsy forceps. Resected and retrieved. Recommendation:        - Discharge patient to home.                        -  Resume previous diet.                        - Continue present medications.                        - Await pathology results. Procedure Code(s):     --- Professional ---                        [phone], Colonoscopy, flexible; with removal of                         tumor(s), polyp(s), or other lesion(s) by snare                         technique                        [id number], 59, Colonoscopy, flexible; with biopsy, single                          or multiple Diagnosis Code(s):     --- Professional ---                        Z86.010, Personal history of colonic polyps                        D12.5, Benign neoplasm of sigmoid colon CPT copyright 2022 American Medical Association. All rights reserved. The codes documented in this report are preliminary and upon coder review may  be revised to meet current compliance requirements. [name] MD, MD 08/08/2023 10:06:10 AM This report has been signed electronically. Number of Addenda: 0 Note Initiated On: 08/08/2023 9:35 AM Scope Withdrawal Time: 0 hours 8 minutes 12 seconds  Total Procedure Duration: 0 hours 11 minutes 40 seconds  Estimated Blood Loss:  Estimated blood loss: none.      [facility]

## 2023-08-08 NOTE — H&P (Signed)
[name], MD [facility] [address] [place], [place] [address] Phone:[phone] Fax : [phone]  Primary Care Physician:  Alba Cory, MD Primary Gastroenterologist:  Dr. Servando Snare  Pre-Procedure History & Physical: HPI:  Ryan [name] is a 68 y.o. male is here for an colonoscopy.   Past Medical History:  Diagnosis Date   CKD (chronic kidney disease)    History of blood transfusion    Hyperlipidemia    Hypertension    Neuromuscular disorder (HCC)    numbness feet   Peripheral vascular disease (HCC)    Shortness of breath dyspnea    occasional     Past Surgical History:  Procedure Laterality Date   ABDOMINAL AORTOGRAM W/LOWER EXTREMITY N/A 09/13/2019   Procedure: ABDOMINAL AORTOGRAM W/LOWER EXTREMITY;  Surgeon: Maeola Harman, MD;  Location: [facility] INVASIVE CV LAB;  Service: Cardiovascular;  Laterality: N/A;   AMPUTATION Right 05/09/2020   Procedure: RIGHT BELOW KNEE AMPUTATION;  Surgeon: Maeola Harman, MD;  Location: [facility] OR;  Service: Vascular;  Laterality: Right;  w/ a block   COLONOSCOPY WITH PROPOFOL N/A 02/15/2016   Procedure: COLONOSCOPY WITH PROPOFOL;  Surgeon: [name], MD;  Location: [facility] SURGERY CNTR;  Service: Endoscopy;  Laterality: N/A;   FEMORAL-POPLITEAL BYPASS GRAFT Right 09/14/2019   Procedure: BYPASS GRAFT FEMORAL-POPLITEAL ARTERY;  Surgeon: Maeola Harman, MD;  Location: [facility] OR;  Service: Vascular;  Laterality: Right;   HERNIA REPAIR  1999   left inguinal   INSERTION OF ILIAC STENT Right 09/14/2019   Procedure: Insertion Of Common and External Iliac Stent;  Surgeon: Maeola Harman, MD;  Location: [facility] OR;  Service: Vascular;  Laterality: Right;   LOWER EXTREMITY ANGIOGRAPHY Right 05/07/2018   Procedure: LOWER EXTREMITY ANGIOGRAPHY;  Surgeon: Annice Needy, MD;  Location: [facility] INVASIVE CV LAB;  Service: Cardiovascular;  Laterality: Right;   LOWER EXTREMITY ANGIOGRAPHY Right 06/25/2018   Procedure: LOWER  EXTREMITY ANGIOGRAPHY;  Surgeon: Annice Needy, MD;  Location: [facility] INVASIVE CV LAB;  Service: Cardiovascular;  Laterality: Right;   LOWER EXTREMITY ANGIOGRAPHY Right 07/01/2018   Procedure: LOWER EXTREMITY ANGIOGRAPHY;  Surgeon: Annice Needy, MD;  Location: [facility] INVASIVE CV LAB;  Service: Cardiovascular;  Laterality: Right;   LOWER EXTREMITY ANGIOGRAPHY Right 08/12/2018   Procedure: LOWER EXTREMITY ANGIOGRAPHY;  Surgeon: Annice Needy, MD;  Location: [facility] INVASIVE CV LAB;  Service: Cardiovascular;  Laterality: Right;   LOWER EXTREMITY ANGIOGRAPHY Right 09/03/2018   Procedure: LOWER EXTREMITY ANGIOGRAPHY;  Surgeon: Annice Needy, MD;  Location: [facility] INVASIVE CV LAB;  Service: Cardiovascular;  Laterality: Right;   LOWER EXTREMITY ANGIOGRAPHY Right 09/04/2018   Procedure: Lower Extremity Angiography;  Surgeon: Annice Needy, MD;  Location: [facility] INVASIVE CV LAB;  Service: Cardiovascular;  Laterality: Right;   LOWER EXTREMITY ANGIOGRAPHY Right 10/01/2018   Procedure: LOWER EXTREMITY ANGIOGRAPHY;  Surgeon: Annice Needy, MD;  Location: [facility] INVASIVE CV LAB;  Service: Cardiovascular;  Laterality: Right;   LOWER EXTREMITY ANGIOGRAPHY Right 10/01/2018   Procedure: Lower Extremity Angiography;  Surgeon: Annice Needy, MD;  Location: [facility] INVASIVE CV LAB;  Service: Cardiovascular;  Laterality: Right;   LOWER EXTREMITY ANGIOGRAPHY Right 10/15/2018   Procedure: LOWER EXTREMITY ANGIOGRAPHY;  Surgeon: Annice Needy, MD;  Location: [facility] INVASIVE CV LAB;  Service: Cardiovascular;  Laterality: Right;   LOWER EXTREMITY ANGIOGRAPHY Left 10/16/2018   Procedure: Lower Extremity Angiography;  Surgeon: Annice Needy, MD;  Location: [facility] INVASIVE CV LAB;  Service: Cardiovascular;  Laterality: Left;   LOWER  EXTREMITY ANGIOGRAPHY Left 01/20/2019   Procedure: LOWER EXTREMITY ANGIOGRAPHY;  Surgeon: Annice Needy, MD;  Location: [facility] INVASIVE CV LAB;  Service: Cardiovascular;  Laterality: Left;   LOWER EXTREMITY ANGIOGRAPHY Right 01/21/2019    Procedure: Lower Extremity Angiography;  Surgeon: Annice Needy, MD;  Location: [facility] INVASIVE CV LAB;  Service: Cardiovascular;  Laterality: Right;   LOWER EXTREMITY ANGIOGRAPHY Right 01/28/2019   Procedure: LOWER EXTREMITY ANGIOGRAPHY;  Surgeon: Annice Needy, MD;  Location: [facility] INVASIVE CV LAB;  Service: Cardiovascular;  Laterality: Right;   LOWER EXTREMITY ANGIOGRAPHY Right 01/29/2019   Procedure: Lower Extremity Angiography;  Surgeon: Annice Needy, MD;  Location: [facility] INVASIVE CV LAB;  Service: Cardiovascular;  Laterality: Right;   LOWER EXTREMITY ANGIOGRAPHY Right 04/04/2020   Procedure: LOWER EXTREMITY ANGIOGRAPHY;  Surgeon: Maeola Harman, MD;  Location: [facility] INVASIVE CV LAB;  Service: Cardiovascular;  Laterality: Right;   LOWER EXTREMITY INTERVENTION Right 07/02/2018   Procedure: LOWER EXTREMITY INTERVENTION;  Surgeon: Annice Needy, MD;  Location: [facility] INVASIVE CV LAB;  Service: Cardiovascular;  Laterality: Right;   PERIPHERAL VASCULAR BALLOON ANGIOPLASTY Left 04/04/2020   Procedure: PERIPHERAL VASCULAR BALLOON ANGIOPLASTY;  Surgeon: Maeola Harman, MD;  Location: [facility] INVASIVE CV LAB;  Service: Cardiovascular;  Laterality: Left;  external iliac   POLYPECTOMY N/A 02/15/2016   Procedure: POLYPECTOMY;  Surgeon: [name], MD;  Location: [facility] SURGERY CNTR;  Service: Endoscopy;  Laterality: N/A;    Prior to Admission medications   Medication Sig Start Date End Date Taking? Authorizing Provider  albuterol (VENTOLIN HFA) 108 (90 Base) MCG/ACT inhaler Inhale 2 puffs into the lungs every 6 (six) hours as needed for wheezing or shortness of breath. 04/11/23  Yes Sowles, Danna Hefty, MD  ascorbic acid (VITAMIN C) 500 MG tablet Take 1 tablet (500 mg total) by mouth daily. 05/25/20  Yes Love, Evlyn Kanner, PA-C  aspirin EC 81 MG tablet Take 1 tablet (81 mg total) by mouth daily. 08/13/18  Yes Stegmayer, Ranae Plumber, PA-C  atorvastatin (LIPITOR) 40 MG tablet Take 1 tablet by mouth once daily  07/07/23  Yes Sowles, Danna Hefty, MD  Cholecalciferol (DIALYVITE VITAMIN D 5000) 125 MCG (5000 UT) capsule Take 5,000 Units by mouth daily.   Yes [provider]  Fluticasone-Umeclidin-Vilant (TRELEGY ELLIPTA) 100-62.5-25 MCG/ACT AEPB Inhale 1 puff into the lungs daily. 04/11/23  Yes Sowles, Danna Hefty, MD  gabapentin (NEURONTIN) 100 MG capsule Take 1 capsule (100 mg total) by mouth 3 (three) times daily. 01/07/23  Yes Sowles, Danna Hefty, MD  hydrALAZINE (APRESOLINE) 10 MG tablet Take 1 tablet (10 mg total) by mouth 3 (three) times daily. 07/15/23  Yes Sowles, Danna Hefty, MD  Multiple Vitamin (MULTIVITAMIN WITH MINERALS) TABS tablet Take 1 tablet by mouth daily. 05/25/20  Yes Love, Evlyn Kanner, PA-C  tadalafil (CIALIS) 20 MG tablet Take 0.5-1 tablets (10-20 mg total) by mouth every other day as needed for erectile dysfunction. 01/07/23  Yes Sowles, Danna Hefty, MD  traZODone (DESYREL) 100 MG tablet Take 1 tablet (100 mg total) by mouth at bedtime. 04/11/23  Yes Sowles, Danna Hefty, MD  triamcinolone cream (KENALOG) 0.1 % Apply 1 Application topically 2 (two) times daily. 07/15/23  Yes Sowles, Danna Hefty, MD  valsartan (DIOVAN) 80 MG tablet Take 1 tablet (80 mg total) by mouth daily. BP and kidney disease 07/15/23  Yes Sowles, Danna Hefty, MD  clopidogrel (PLAVIX) 75 MG tablet Take 1 tablet (75 mg total) by mouth daily. 01/07/23   Alba Cory, MD  DULoxetine (CYMBALTA) 60 MG capsule Take 1 capsule (60  mg total) by mouth daily. Patient not taking: Reported on 07/17/2023 01/07/23   Alba Cory, MD    Allergies as of 07/17/2023   (No Known Allergies)    Family History  Problem Relation Age of Onset   COPD Mother    Hypertension Father    Heart attack Brother     Social History   Socioeconomic History   Marital status: Single    Spouse name: [name]   Number of children: 5   Years of education: Not on file   Highest education level: High school graduate  Occupational History   Occupation: Forklift    Comment:  [employer]  Tobacco Use   Smoking status: Every Day    Current packs/day: 0.00    Average packs/day: 0.5 packs/day for 40.4 years (20.2 ttl pk-yrs)    Types: Cigarettes    Start date: 07/21/1978    Last attempt to quit: 12/2018    Years since quitting: 4.6    Passive exposure: Never   Smokeless tobacco: Never   Tobacco comments:    Started using cocaine recently but states he will try to quit   Vaping Use   Vaping status: Never Used  Substance and Sexual Activity   Alcohol use: Not Currently    Alcohol/week: 12.0 standard drinks of alcohol    Types: 12 Cans of beer per week    Comment: 4 beers / week now per pt 05/05/20   Drug use: Not Currently    Frequency: 2.0 times per week    Types: Cocaine    Comment: sept 2024   Sexual activity: Yes    Partners: Female    Birth control/protection: None  Other Topics Concern   Not on file  Social History Narrative   Not on file   Social Drivers of Health   Financial Resource Strain: Low Risk  (03/12/2018)   Overall Financial Resource Strain (CARDIA)    Difficulty of Paying Living Expenses: Not hard at all  Food Insecurity: No Food Insecurity (06/18/2019)   Received from [facility], [facility]   Hunger Vital Sign    Worried About Running Out of Food in the Last Year: Never true    Ran Out of Food in the Last Year: Never true  Transportation Needs: No Transportation Needs (03/12/2018)   PRAPARE - Administrator, Civil Service (Medical): No    Lack of Transportation (Non-Medical): No  Physical Activity: Sufficiently Active (06/08/2018)   Exercise Vital Sign    Days of Exercise per Week: 6 days    Minutes of Exercise per Session: 60 min  Recent Concern: Physical Activity - Inactive (03/12/2018)   Exercise Vital Sign    Days of Exercise per Week: 0 days    Minutes of Exercise per Session: 0 min  Stress: No Stress Concern Present (03/12/2018)   [employer] of Occupational Health - Occupational Stress  Questionnaire    Feeling of Stress : Not at all  Social Connections: Somewhat Isolated (03/12/2018)   Social Connection and Isolation Panel [NHANES]    Frequency of Communication with Friends and Family: Three times a week    Frequency of Social Gatherings with Friends and Family: Twice a week    Attends Religious Services: Never    Database administrator or Organizations: No    Attends Banker Meetings: Never    Marital Status: Married  Catering manager Violence: Not At Risk (03/12/2018)   Humiliation, Afraid, Rape, and Kick  questionnaire    Fear of Current or Ex-Partner: No    Emotionally Abused: No    Physically Abused: No    Sexually Abused: No    Review of Systems: See HPI, otherwise negative ROS  Physical Exam: BP (!) 155/87   Pulse 66   Temp (!) 97 F (36.1 C) (Temporal)   Resp 15   Ht 5\' 8"  (1.727 m)   Wt 61.8 kg   SpO2 100%   BMI 20.71 kg/m  General:   Alert,  pleasant and cooperative in NAD Head:  Normocephalic and atraumatic. Neck:  Supple; no masses or thyromegaly. Lungs:  Clear throughout to auscultation.    Heart:  Regular rate and rhythm. Abdomen:  Soft, nontender and nondistended. Normal bowel sounds, without guarding, and without rebound.   Neurologic:  Alert and  oriented x4;  grossly normal neurologically.  Impression/Plan: Ryan [name] is here for an colonoscopy to be performed for a history of adenomatous polyps on 2017   Risks, benefits, limitations, and alternatives regarding  colonoscopy have been reviewed with the patient.  Questions have been answered.  All parties agreeable.   [name], MD  08/08/2023, 9:35 AM

## 2023-08-08 NOTE — Anesthesia Postprocedure Evaluation (Signed)
Anesthesia Post Note  Patient: Ryan [name]  Procedure(s) Performed: COLONOSCOPY WITH PROPOFOL POLYPECTOMY INTESTINAL  Patient location during evaluation: Endoscopy Anesthesia Type: General Level of consciousness: awake and alert Pain management: pain level controlled Vital Signs Assessment: post-procedure vital signs reviewed and stable Respiratory status: spontaneous breathing, nonlabored ventilation, respiratory function stable and patient connected to nasal cannula oxygen Cardiovascular status: blood pressure returned to baseline and stable Postop Assessment: no apparent nausea or vomiting Anesthetic complications: no   No notable events documented.   Last Vitals:  Vitals:   08/08/23 1020 08/08/23 1029  BP: (!) 88/61 (!) 92/53  Pulse: (!) 58 (!) 57  Resp:    Temp:    SpO2: 99% 99%    Last Pain:  Vitals:   08/08/23 1029  TempSrc:   PainSc: 0-No pain                 Lenard Simmer

## 2023-08-11 ENCOUNTER — Encounter: Payer: Self-pay | Admitting: Gastroenterology

## 2023-08-11 LAB — SURGICAL PATHOLOGY

## 2023-08-12 NOTE — Progress Notes (Signed)
 [address]       [place] [address]             [phone]        Ryan [name] [id number] 25-Jun-1955  History of Present Illness:  Ryan [name] is a 68 yo male with known history of PVD, HTN, HLD, and CKD.  He has been referred to our office for aneurysm surveillance.  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     Current Outpatient Medications on File Prior to Visit  Medication Sig Dispense Refill   albuterol (VENTOLIN HFA) 108 (90 Base) MCG/ACT inhaler Inhale 2 puffs into the lungs every 6 (six) hours as needed for wheezing or shortness of breath. 18 g 0   ascorbic acid (VITAMIN C) 500 MG tablet Take 1 tablet (500 mg total) by mouth daily. 30 tablet 0   aspirin EC 81 MG tablet Take 1 tablet (81 mg total) by mouth daily. 90 tablet 3   atorvastatin (LIPITOR) 40 MG tablet Take 1 tablet by mouth once daily 90 tablet 0   Cholecalciferol (DIALYVITE VITAMIN D 5000) 125 MCG (5000 UT) capsule Take 5,000 Units by mouth daily.     clopidogrel (PLAVIX) 75 MG tablet Take 1 tablet (75 mg total) by mouth daily. 90 tablet 1   DULoxetine (CYMBALTA) 60 MG capsule Take 1 capsule (60 mg total) by mouth daily. (Patient not taking: Reported on 07/17/2023) 90 capsule 1   Fluticasone-Umeclidin-Vilant (TRELEGY ELLIPTA) 100-62.5-25 MCG/ACT AEPB Inhale 1 puff into the lungs daily. 180 each 1   gabapentin (NEURONTIN) 100 MG capsule Take 1 capsule (100 mg total) by mouth 3 (three) times daily. 270 capsule 1   hydrALAZINE (APRESOLINE) 10 MG tablet Take 1 tablet (10 mg total) by mouth 3 (three) times daily. 90 tablet 0   Multiple Vitamin (MULTIVITAMIN WITH MINERALS) TABS tablet Take 1 tablet by mouth daily.     tadalafil (CIALIS) 20 MG tablet Take 0.5-1 tablets (10-20 mg total) by mouth every other day as needed for erectile dysfunction. 10 tablet 2   traZODone (DESYREL) 100 MG tablet Take 1 tablet (100 mg total)  by mouth at bedtime. 90 tablet 1   triamcinolone cream (KENALOG) 0.1 % Apply 1 Application topically 2 (two) times daily. 454 g 0   valsartan (DIOVAN) 80 MG tablet Take 1 tablet (80 mg total) by mouth daily. BP and kidney disease 90 tablet 0   No current facility-administered medications on file prior to visit.     There were no vitals taken for this visit.  Physical Exam  CTA Results:  Cardiovascular: Preferential opacification of the thoracic aorta. Tubular ascending thoracic aorta is unchanged at 3.8 x 3.7 cm. Aortic valve measures 2.5 cm. Sinuses of Valsalva measure 3.7 cm. Descending thoracic aorta measures 2.1 x 2.1 cm. Moderate mixed calcific atherosclerosis. Normal heart size. No pericardial effusion.  IMPRESSION: 1. Tubular ascending thoracic aorta is unchanged at 3.8 x 3.7 cm. 2. Severe emphysema and diffuse bilateral bronchial wall thickening. 3. Scarring and volume loss of the left lung with chronic, partially calcified left pleural effusion, most likely sequelae of prior hemothorax.   Aortic Atherosclerosis (ICD10-I70.0) and Emphysema (ICD10-J43.9).     Electronically Signed   By: Jearld Lesch M.D.   On: 07/30/2023 12:27    A/P:   NO SHOW  Risk Modification:  Statin:  Patient was counseled on importance of Blood Pressure Control.  Despite Medical intervention if the patient notices persistently elevated blood pressure readings.  They are instructed to contact their Primary Care Physician  Please avoid use of Fluoroquinolones as this can potentially increase your risk of Aortic Rupture and/or Dissection  Patient educated on signs and symptoms of Aortic Dissection, handout also provided in AVS  Micah Galeno, PA-C 08/12/23

## 2023-08-12 NOTE — Patient Instructions (Signed)
Patient is counseled regarding the importance of long term risk factor modification as they pertain to the presence of ischemic heart disease including avoiding the use of all tobacco products, dietary modifications and medical therapy for diabetes, cholesterol and lipid management, and regular exercise.

## 2023-08-18 ENCOUNTER — Encounter: Payer: Self-pay | Admitting: Family Medicine

## 2023-08-18 ENCOUNTER — Institutional Professional Consult (permissible substitution): Payer: 59 | Admitting: Physician Assistant

## 2023-08-18 ENCOUNTER — Ambulatory Visit (INDEPENDENT_AMBULATORY_CARE_PROVIDER_SITE_OTHER): Payer: 59 | Admitting: Family Medicine

## 2023-08-18 VITALS — BP 126/74 | HR 90 | Temp 98.1°F | Resp 16 | Ht 68.0 in | Wt 139.1 lb

## 2023-08-18 DIAGNOSIS — I129 Hypertensive chronic kidney disease with stage 1 through stage 4 chronic kidney disease, or unspecified chronic kidney disease: Secondary | ICD-10-CM | POA: Diagnosis not present

## 2023-08-18 DIAGNOSIS — I739 Peripheral vascular disease, unspecified: Secondary | ICD-10-CM

## 2023-08-18 DIAGNOSIS — N183 Hypertensive chronic kidney disease with stage 1 through stage 4 chronic kidney disease, or unspecified chronic kidney disease: Secondary | ICD-10-CM

## 2023-08-18 DIAGNOSIS — I7789 Other specified disorders of arteries and arterioles: Secondary | ICD-10-CM | POA: Diagnosis not present

## 2023-08-18 DIAGNOSIS — N1831 Chronic kidney disease, stage 3a: Secondary | ICD-10-CM

## 2023-08-18 MED ORDER — VALSARTAN 160 MG PO TABS: 160.0000 mg | ORAL_TABLET | Freq: Every day | ORAL | 0 refills | Status: DC

## 2023-08-18 MED ORDER — CLOPIDOGREL BISULFATE 75 MG PO TABS: 75.0000 mg | ORAL_TABLET | Freq: Every day | ORAL | 1 refills | Status: DC

## 2023-08-18 NOTE — Progress Notes (Signed)
 Name: Ryan [name]   MRN: [id number]    DOB: 10-09-1955   Date:08/18/2023       Progress Note  Subjective  Chief Complaint  Chief Complaint  Patient presents with   Medical Management of Chronic Issues    HTN- pt states has been doing well at home, has been taking valsartan and hydralazine daily   HPI   HTN: he is taking Valsartan 80 mg and instead of taking hydralazine 10 mg prn , he is taking twice daily, advised to stop hydralazine and take valsartan 160 mg , hydralazine is prn only if bp spikes. No chest pain or palpitation  Aneurysm of thoracic aorta: he has a referral placed to see cardiovascular surgeon, he also sees vascular surgeon for PAD and is taking statin therapy , plavix and aspirin 81 mg  IMPRESSION: CT chest done 07/30/2023  1. Tubular ascending thoracic aorta is unchanged at 3.8 x 3.7 cm. 2. Severe emphysema and diffuse bilateral bronchial wall thickening. 3. Scarring and volume loss of the left lung with chronic, partially calcified left pleural effusion, most likely sequelae of prior hemothorax.  Emphysema: he continues to smoke a few cigarettes daily, productive cough in am, using Trelegy only prn . He is trying to quit smoking   Patient Active Problem List   Diagnosis Date Noted   History of colonic polyps 08/08/2023   Polyp of ascending colon 08/08/2023   Cocaine use disorder, mild, in early remission (HCC) 08/27/2022   History of alcoholism (HCC) 01/24/2022   History of amputation below knee, right (HCC) 01/24/2022   Moderate protein malnutrition (HCC) 01/24/2022   Benign hypertension with chronic kidney disease, stage III (HCC) 01/24/2022   Aneurysm of ascending aorta without rupture (HCC) 01/24/2022   Stage 3a chronic kidney disease (HCC) 01/24/2022   Phantom pain after amputation of lower extremity (HCC)    Below-knee amputation of right lower extremity (HCC) 05/17/2020   PAD (peripheral artery disease) (HCC) 07/21/2018   B12 deficiency  03/12/2018   Vitamin B1 deficiency 03/12/2018   Varicose veins of leg with pain, right 03/12/2018   Enlarged thoracic aorta (HCC) 02/24/2018   Paresthesia 02/24/2018   Emphysema lung (HCC) 02/20/2018   Atherosclerosis of aorta (HCC) 02/20/2018   Hypertension, benign 08/31/2015   Tobacco use 08/31/2015    Past Surgical History:  Procedure Laterality Date   ABDOMINAL AORTOGRAM W/LOWER EXTREMITY N/A 09/13/2019   Procedure: ABDOMINAL AORTOGRAM W/LOWER EXTREMITY;  Surgeon: Maeola Harman, MD;  Location: [facility] INVASIVE CV LAB;  Service: Cardiovascular;  Laterality: N/A;   AMPUTATION Right 05/09/2020   Procedure: RIGHT BELOW KNEE AMPUTATION;  Surgeon: Maeola Harman, MD;  Location: [facility] OR;  Service: Vascular;  Laterality: Right;  w/ a block   COLONOSCOPY WITH PROPOFOL N/A 02/15/2016   Procedure: COLONOSCOPY WITH PROPOFOL;  Surgeon: [name], MD;  Location: [facility] SURGERY CNTR;  Service: Endoscopy;  Laterality: N/A;   COLONOSCOPY WITH PROPOFOL N/A 08/08/2023   Procedure: COLONOSCOPY WITH PROPOFOL;  Surgeon: [name], MD;  Location: [facility] ENDOSCOPY;  Service: Endoscopy;  Laterality: N/A;   FEMORAL-POPLITEAL BYPASS GRAFT Right 09/14/2019   Procedure: BYPASS GRAFT FEMORAL-POPLITEAL ARTERY;  Surgeon: Maeola Harman, MD;  Location: [facility] OR;  Service: Vascular;  Laterality: Right;   HERNIA REPAIR  1999   left inguinal   INSERTION OF ILIAC STENT Right 09/14/2019   Procedure: Insertion Of Common and External Iliac Stent;  Surgeon: Maeola Harman, MD;  Location: [facility] OR;  Service: Vascular;  Laterality: Right;  LOWER EXTREMITY ANGIOGRAPHY Right 05/07/2018   Procedure: LOWER EXTREMITY ANGIOGRAPHY;  Surgeon: Annice Needy, MD;  Location: [facility] INVASIVE CV LAB;  Service: Cardiovascular;  Laterality: Right;   LOWER EXTREMITY ANGIOGRAPHY Right 06/25/2018   Procedure: LOWER EXTREMITY ANGIOGRAPHY;  Surgeon: Annice Needy, MD;  Location: [facility] INVASIVE CV LAB;  Service:  Cardiovascular;  Laterality: Right;   LOWER EXTREMITY ANGIOGRAPHY Right 07/01/2018   Procedure: LOWER EXTREMITY ANGIOGRAPHY;  Surgeon: Annice Needy, MD;  Location: [facility] INVASIVE CV LAB;  Service: Cardiovascular;  Laterality: Right;   LOWER EXTREMITY ANGIOGRAPHY Right 08/12/2018   Procedure: LOWER EXTREMITY ANGIOGRAPHY;  Surgeon: Annice Needy, MD;  Location: [facility] INVASIVE CV LAB;  Service: Cardiovascular;  Laterality: Right;   LOWER EXTREMITY ANGIOGRAPHY Right 09/03/2018   Procedure: LOWER EXTREMITY ANGIOGRAPHY;  Surgeon: Annice Needy, MD;  Location: [facility] INVASIVE CV LAB;  Service: Cardiovascular;  Laterality: Right;   LOWER EXTREMITY ANGIOGRAPHY Right 09/04/2018   Procedure: Lower Extremity Angiography;  Surgeon: Annice Needy, MD;  Location: [facility] INVASIVE CV LAB;  Service: Cardiovascular;  Laterality: Right;   LOWER EXTREMITY ANGIOGRAPHY Right 10/01/2018   Procedure: LOWER EXTREMITY ANGIOGRAPHY;  Surgeon: Annice Needy, MD;  Location: [facility] INVASIVE CV LAB;  Service: Cardiovascular;  Laterality: Right;   LOWER EXTREMITY ANGIOGRAPHY Right 10/01/2018   Procedure: Lower Extremity Angiography;  Surgeon: Annice Needy, MD;  Location: [facility] INVASIVE CV LAB;  Service: Cardiovascular;  Laterality: Right;   LOWER EXTREMITY ANGIOGRAPHY Right 10/15/2018   Procedure: LOWER EXTREMITY ANGIOGRAPHY;  Surgeon: Annice Needy, MD;  Location: [facility] INVASIVE CV LAB;  Service: Cardiovascular;  Laterality: Right;   LOWER EXTREMITY ANGIOGRAPHY Left 10/16/2018   Procedure: Lower Extremity Angiography;  Surgeon: Annice Needy, MD;  Location: [facility] INVASIVE CV LAB;  Service: Cardiovascular;  Laterality: Left;   LOWER EXTREMITY ANGIOGRAPHY Left 01/20/2019   Procedure: LOWER EXTREMITY ANGIOGRAPHY;  Surgeon: Annice Needy, MD;  Location: [facility] INVASIVE CV LAB;  Service: Cardiovascular;  Laterality: Left;   LOWER EXTREMITY ANGIOGRAPHY Right 01/21/2019   Procedure: Lower Extremity Angiography;  Surgeon: Annice Needy, MD;  Location: [facility] INVASIVE CV  LAB;  Service: Cardiovascular;  Laterality: Right;   LOWER EXTREMITY ANGIOGRAPHY Right 01/28/2019   Procedure: LOWER EXTREMITY ANGIOGRAPHY;  Surgeon: Annice Needy, MD;  Location: [facility] INVASIVE CV LAB;  Service: Cardiovascular;  Laterality: Right;   LOWER EXTREMITY ANGIOGRAPHY Right 01/29/2019   Procedure: Lower Extremity Angiography;  Surgeon: Annice Needy, MD;  Location: [facility] INVASIVE CV LAB;  Service: Cardiovascular;  Laterality: Right;   LOWER EXTREMITY ANGIOGRAPHY Right 04/04/2020   Procedure: LOWER EXTREMITY ANGIOGRAPHY;  Surgeon: Maeola Harman, MD;  Location: [facility] INVASIVE CV LAB;  Service: Cardiovascular;  Laterality: Right;   LOWER EXTREMITY INTERVENTION Right 07/02/2018   Procedure: LOWER EXTREMITY INTERVENTION;  Surgeon: Annice Needy, MD;  Location: [facility] INVASIVE CV LAB;  Service: Cardiovascular;  Laterality: Right;   PERIPHERAL VASCULAR BALLOON ANGIOPLASTY Left 04/04/2020   Procedure: PERIPHERAL VASCULAR BALLOON ANGIOPLASTY;  Surgeon: Maeola Harman, MD;  Location: [facility] INVASIVE CV LAB;  Service: Cardiovascular;  Laterality: Left;  external iliac   POLYPECTOMY N/A 02/15/2016   Procedure: POLYPECTOMY;  Surgeon: [name], MD;  Location: [facility] SURGERY CNTR;  Service: Endoscopy;  Laterality: N/A;   POLYPECTOMY  08/08/2023   Procedure: POLYPECTOMY INTESTINAL;  Surgeon: [name], MD;  Location: [facility] ENDOSCOPY;  Service: Endoscopy;;    Family History  Problem Relation Age of Onset   COPD Mother    Hypertension Father  Heart attack Brother     Social History   Tobacco Use   Smoking status: Every Day    Current packs/day: 0.00    Average packs/day: 0.5 packs/day for 40.4 years (20.2 ttl pk-yrs)    Types: Cigarettes    Start date: 07/21/1978    Last attempt to quit: 12/2018    Years since quitting: 4.6    Passive exposure: Never   Smokeless tobacco: Never   Tobacco comments:    Started using cocaine recently but states he will try to quit   Substance Use  Topics   Alcohol use: Not Currently    Alcohol/week: 12.0 standard drinks of alcohol    Types: 12 Cans of beer per week    Comment: 4 beers / week now per pt 05/05/20     Current Outpatient Medications:    albuterol (VENTOLIN HFA) 108 (90 Base) MCG/ACT inhaler, Inhale 2 puffs into the lungs every 6 (six) hours as needed for wheezing or shortness of breath., Disp: 18 g, Rfl: 0   ascorbic acid (VITAMIN C) 500 MG tablet, Take 1 tablet (500 mg total) by mouth daily., Disp: 30 tablet, Rfl: 0   aspirin EC 81 MG tablet, Take 1 tablet (81 mg total) by mouth daily., Disp: 90 tablet, Rfl: 3   atorvastatin (LIPITOR) 40 MG tablet, Take 1 tablet by mouth once daily, Disp: 90 tablet, Rfl: 0   Cholecalciferol (DIALYVITE VITAMIN D 5000) 125 MCG (5000 UT) capsule, Take 5,000 Units by mouth daily., Disp: , Rfl:    clopidogrel (PLAVIX) 75 MG tablet, Take 1 tablet (75 mg total) by mouth daily., Disp: 90 tablet, Rfl: 1   Fluticasone-Umeclidin-Vilant (TRELEGY ELLIPTA) 100-62.5-25 MCG/ACT AEPB, Inhale 1 puff into the lungs daily., Disp: 180 each, Rfl: 1   gabapentin (NEURONTIN) 100 MG capsule, Take 1 capsule (100 mg total) by mouth 3 (three) times daily., Disp: 270 capsule, Rfl: 1   hydrALAZINE (APRESOLINE) 10 MG tablet, Take 1 tablet (10 mg total) by mouth 3 (three) times daily., Disp: 90 tablet, Rfl: 0   Multiple Vitamin (MULTIVITAMIN WITH MINERALS) TABS tablet, Take 1 tablet by mouth daily., Disp: , Rfl:    tadalafil (CIALIS) 20 MG tablet, Take 0.5-1 tablets (10-20 mg total) by mouth every other day as needed for erectile dysfunction., Disp: 10 tablet, Rfl: 2   traZODone (DESYREL) 100 MG tablet, Take 1 tablet (100 mg total) by mouth at bedtime., Disp: 90 tablet, Rfl: 1   triamcinolone cream (KENALOG) 0.1 %, Apply 1 Application topically 2 (two) times daily., Disp: 454 g, Rfl: 0   valsartan (DIOVAN) 80 MG tablet, Take 1 tablet (80 mg total) by mouth daily. BP and kidney disease, Disp: 90 tablet, Rfl: 0    DULoxetine (CYMBALTA) 60 MG capsule, Take 1 capsule (60 mg total) by mouth daily. (Patient not taking: Reported on 08/18/2023), Disp: 90 capsule, Rfl: 1  No Known Allergies  I personally reviewed active problem list, medication list, allergies with the patient/caregiver today.   ROS  Ten systems reviewed and is negative except as mentioned in HPI    Objective  Vitals:   08/18/23 0958  BP: 126/74  Pulse: 90  Resp: 16  Temp: 98.1 F (36.7 C)  TempSrc: Oral  SpO2: 98%  Weight: 139 lb 1.6 oz (63.1 kg)  Height: 5\' 8"  (1.727 m)    Body mass index is 21.15 kg/m.  Physical Exam  Constitutional: Patient appears well-developed and well-nourished.  No distress.  HEENT: head atraumatic, normocephalic, pupils equal  and reactive to light, neck supple Cardiovascular: Normal rate, regular rhythm and normal heart sounds.  No murmur heard. No BLE edema. Pulmonary/Chest: Effort normal and breath sounds normal. No respiratory distress. Abdominal: Soft.  There is no tenderness. Psychiatric: Patient has a normal mood and affect. behavior is normal. Judgment and thought content normal.   Recent Results (from the past 2160 hours)  I-STAT creatinine     Status: Abnormal   Collection Time: 07/30/23 10:59 AM  Result Value Ref Range   Creatinine, Ser 1.60 (H) 0.61 - 1.24 mg/dL  Surgical pathology     Status: None   Collection Time: 08/08/23 12:00 AM  Result Value Ref Range   SURGICAL PATHOLOGY      SURGICAL PATHOLOGY [facility] [address], [address] [place], [place] [address] Telephone [phone] or [phone] Fax [phone]  REPORT OF SURGICAL PATHOLOGY   Accession #: [id number] Patient Name: LAWSON, MAHONE Visit # : [id number]  MRN: [id number] Physician: [name] DOB/Age [birth year] (Age: 68) Gender: M Collected Date: 08/08/2023 Received Date: 08/08/2023  FINAL DIAGNOSIS       1. Cecum Polyp, cbx :       BENIGN COLONIC MUCOSA.       NEGATIVE FOR DYSPLASIA OR MALIGNANCY.       2. 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       3. Sigmoid  Colon Polyp, cold snare :       HYPERPLASTIC POLYP (1).       ELECTRONIC SIGNATURE : Jacelyn Grip, John, Pathologist, Electronic Signature  MICROSCOPIC DESCRIPTION  CASE COMMENTS STAINS USED IN DIAGNOSIS: H&E H&E H&E    CLINICAL HISTORY  SPECIMEN(S) OBTAINED 1. Cecum Polyp, Cbx 2. Descending Colon Polyp, Cbx 3. Sigmoi d  Colon Polyp, Cold Snare  SPECIMEN COMMENTS: SPECIMEN CLINICAL INFORMATION: 1. History of colon polyps    Gross Description 1. "CBX cecal polyp", received in formalin is a single 0.5 x 0.4 x 0.1 cm gray-pink tissue fragment.The specimen is submitted in toto in 1 block (1A). 2. "CBX descending colon polyp", received in formalin is a 1.0 x 0.2 x 0.1 cm aggregate of 4 pale tan tissue fragments.The specimen is submitted in toto in 1 block (2A). 3. "Cold snare sigmoid colon polyp", received in formalin is a single 1.1 x 0.9 x 0.1 cm tan-pink tissue fragment.The specimen is trisected and submitted entirely in 1 block (3A).      AMG 08/08/2023        Report signed out from the following location(s) [place] [address], [place] [address] CLIA #: [id number]  [facility] [address] [place], [place] [address] CLIA #: [id number]     Diabetic Foot Exam:     PHQ2/9:    07/15/2023    9:04 AM 04/11/2023   11:20 AM 01/07/2023    7:40 AM 11/05/2022   11:06 AM 10/09/2022   10:55 AM  Depression screen PHQ 2/9  Decreased Interest 0 0 0 0 0  Down, Depressed, Hopeless 0 0 0 0 0  PHQ - 2 Score 0 0 0 0 0  Altered sleeping 0 0 0 0 0  Tired, decreased energy 0 0 0 0 0  Change in appetite 0 0 0 0 0  Feeling bad or failure about yourself  0 0 0 0 0  Trouble concentrating 0 0 0 0 0  Moving  slowly or fidgety/restless 0 0 0 0 0  Suicidal thoughts 0 0 0  0 0  PHQ-9 Score 0 0 0 0 0  Difficult doing work/chores Not difficult at all        phq 9 is negative  Fall Risk:    07/15/2023    9:04 AM 04/11/2023   11:20 AM 01/07/2023    7:40 AM 11/05/2022   11:06 AM 10/09/2022   10:54 AM  Fall Risk   Falls in the past year? 0 0 0 0 0  Number falls in past yr: 0  0 0 0  Injury with Fall? 0  0 0 0  Risk for fall due to : No Fall Risks No Fall Risks No Fall Risks No Fall Risks No Fall Risks  Follow up Falls prevention discussed;Education provided;Falls evaluation completed Falls prevention discussed Falls prevention discussed Falls prevention discussed Falls prevention discussed     Assessment & Plan  1. Benign hypertension with chronic kidney disease, stage III (HCC) (Primary)  - valsartan (DIOVAN) 160 MG tablet; Take 1 tablet (160 mg total) by mouth daily. BP and kidney disease  Dispense: 90 tablet; Refill: 0  2. Enlarged thoracic aorta (HCC)  Reviewed study, explained waiting for cardiovascular surgeon input  3. Stage 3a chronic kidney disease (HCC)  - valsartan (DIOVAN) 160 MG tablet; Take 1 tablet (160 mg total) by mouth daily. BP and kidney disease  Dispense: 90 tablet; Refill: 0  4. PAD (peripheral artery disease) (HCC)  - clopidogrel (PLAVIX) 75 MG tablet; Take 1 tablet (75 mg total) by mouth daily.  Dispense: 90 tablet; Refill: 1

## 2023-08-18 NOTE — Patient Instructions (Signed)
 Finish valsartan 80 mg by taking two daily and stop taking Hydralazine When you get the new prescription of valsartan 160 mg , take only one daily

## 2023-08-21 ENCOUNTER — Emergency Department (HOSPITAL_COMMUNITY): Payer: 59

## 2023-08-21 ENCOUNTER — Other Ambulatory Visit: Payer: Self-pay

## 2023-08-21 ENCOUNTER — Emergency Department (HOSPITAL_COMMUNITY)
Admission: EM | Admit: 2023-08-21 | Discharge: 2023-08-22 | Disposition: A | Discharge status: Home / Self Care | Payer: 59 | Attending: Emergency Medicine | Admitting: Emergency Medicine

## 2023-08-21 DIAGNOSIS — S39012A Strain of muscle, fascia and tendon of lower back, initial encounter: Secondary | ICD-10-CM | POA: Diagnosis not present

## 2023-08-21 DIAGNOSIS — R109 Unspecified abdominal pain: Secondary | ICD-10-CM | POA: Diagnosis not present

## 2023-08-21 DIAGNOSIS — Z89511 Acquired absence of right leg below knee: Secondary | ICD-10-CM | POA: Diagnosis not present

## 2023-08-21 DIAGNOSIS — R918 Other nonspecific abnormal finding of lung field: Secondary | ICD-10-CM | POA: Diagnosis not present

## 2023-08-21 DIAGNOSIS — Z79899 Other long term (current) drug therapy: Secondary | ICD-10-CM | POA: Diagnosis not present

## 2023-08-21 DIAGNOSIS — Z7982 Long term (current) use of aspirin: Secondary | ICD-10-CM | POA: Insufficient documentation

## 2023-08-21 DIAGNOSIS — J90 Pleural effusion, not elsewhere classified: Secondary | ICD-10-CM | POA: Diagnosis not present

## 2023-08-21 DIAGNOSIS — X58XXXA Exposure to other specified factors, initial encounter: Secondary | ICD-10-CM | POA: Diagnosis not present

## 2023-08-21 DIAGNOSIS — E869 Volume depletion, unspecified: Secondary | ICD-10-CM | POA: Diagnosis not present

## 2023-08-21 DIAGNOSIS — K8020 Calculus of gallbladder without cholecystitis without obstruction: Secondary | ICD-10-CM | POA: Diagnosis not present

## 2023-08-21 DIAGNOSIS — M549 Dorsalgia, unspecified: Secondary | ICD-10-CM | POA: Diagnosis present

## 2023-08-21 DIAGNOSIS — K429 Umbilical hernia without obstruction or gangrene: Secondary | ICD-10-CM | POA: Diagnosis not present

## 2023-08-21 LAB — URINALYSIS, ROUTINE W REFLEX MICROSCOPIC
Bilirubin Urine: NEGATIVE
Glucose, UA: NEGATIVE mg/dL
Hgb urine dipstick: NEGATIVE
Ketones, ur: NEGATIVE mg/dL
Leukocytes,Ua: NEGATIVE
Nitrite: NEGATIVE
Protein, ur: 30 mg/dL — AB
Specific Gravity, Urine: 1.012 (ref 1.005–1.030)
pH: 6 (ref 5.0–8.0)

## 2023-08-21 LAB — COMPREHENSIVE METABOLIC PANEL
ALT: 16 U/L (ref 0–44)
AST: 19 U/L (ref 15–41)
Albumin: 3.9 g/dL (ref 3.5–5.0)
Alkaline Phosphatase: 81 U/L (ref 38–126)
Anion gap: 11 (ref 5–15)
BUN: 19 mg/dL (ref 8–23)
CO2: 22 mmol/L (ref 22–32)
Calcium: 9.1 mg/dL (ref 8.9–10.3)
Chloride: 107 mmol/L (ref 98–111)
Creatinine, Ser: 1.53 mg/dL — ABNORMAL HIGH (ref 0.61–1.24)
GFR, Estimated: 50 mL/min — ABNORMAL LOW (ref >60)
Glucose, Bld: 93 mg/dL (ref 70–99)
Potassium: 4.2 mmol/L (ref 3.5–5.1)
Sodium: 140 mmol/L (ref 135–145)
Total Bilirubin: 0.7 mg/dL (ref 0.0–1.2)
Total Protein: 7.2 g/dL (ref 6.5–8.1)

## 2023-08-21 LAB — CBC
HCT: 38.3 % — ABNORMAL LOW (ref 39.0–52.0)
Hemoglobin: 13 g/dL (ref 13.0–17.0)
MCH: 31.9 pg (ref 26.0–34.0)
MCHC: 33.9 g/dL (ref 30.0–36.0)
MCV: 94.1 fL (ref 80.0–100.0)
Platelets: 216 10*3/uL (ref 150–400)
RBC: 4.07 MIL/uL — ABNORMAL LOW (ref 4.22–5.81)
RDW: 12 % (ref 11.5–15.5)
WBC: 6.1 10*3/uL (ref 4.0–10.5)
nRBC: 0 % (ref 0.0–0.2)

## 2023-08-21 LAB — LIPASE, BLOOD: Lipase: 30 U/L (ref 11–51)

## 2023-08-21 MED ORDER — MORPHINE SULFATE (PF) 4 MG/ML IV SOLN
4.0000 mg | Freq: Once | INTRAVENOUS | Status: AC
  Administered 2023-08-21: 4 mg via INTRAVENOUS
  Filled 2023-08-21: qty 1

## 2023-08-21 MED ORDER — IOHEXOL 350 MG/ML SOLN
100.0000 mL | Freq: Once | INTRAVENOUS | Status: AC | PRN
  Administered 2023-08-21: 100 mL via INTRAVENOUS

## 2023-08-21 MED ORDER — SODIUM CHLORIDE 0.9 % IV BOLUS
1000.0000 mL | Freq: Once | INTRAVENOUS | Status: AC
  Administered 2023-08-21: 1000 mL via INTRAVENOUS

## 2023-08-21 NOTE — ED Triage Notes (Signed)
 Rt. Flank pain  began yesterday, no injuries denies any fevers.  Reports having urgency and feels like he cannot empty bladder,  does not have any burning .  Bending increases the pain and very difficulty to sleep very restless.

## 2023-08-21 NOTE — ED Provider Notes (Signed)
 [place] EMERGENCY DEPARTMENT AT [facility] Provider Note   CSN: [id number] Arrival date & time: 08/21/23  1149     History  Chief Complaint  Patient presents with   Back Pain    Rt. Flank pain started yesterday    Ryan [name] is a 68 y.o. male.  The history is provided by the patient and medical records. No language interpreter was used.  Back Pain    68 year old male with history of peripheral artery disease, right below the knee amputation, alcohol use, protein malnutrition, aortic aneurysm, cocaine disorder presenting complaining of flank pain.  Patient report atraumatic right flank pain that started yesterday morning.  Pain is described as a sharp nonradiating sensation, persistent, moderate in intensity.  Pain worsening with movement.  No associated lightheadedness or dizziness no fever or chills no chest pain trouble breathing no abdominal pain no urinary symptoms no focal numbness or focal weakness and no trauma.  He denies having any rash.  No specific treatment tried.  He has never had this pain before.  He admits to tobacco use but denies any street drug use or alcohol use.  Denies IV drug use or active cancer.  Home Medications Prior to Admission medications   Medication Sig Start Date End Date Taking? Authorizing Provider  albuterol (VENTOLIN HFA) 108 (90 Base) MCG/ACT inhaler Inhale 2 puffs into the lungs every 6 (six) hours as needed for wheezing or shortness of breath. 04/11/23   Alba Cory, MD  ascorbic acid (VITAMIN C) 500 MG tablet Take 1 tablet (500 mg total) by mouth daily. 05/25/20   Love, Evlyn Kanner, PA-C  aspirin EC 81 MG tablet Take 1 tablet (81 mg total) by mouth daily. 08/13/18   Stegmayer, Ranae Plumber, PA-C  atorvastatin (LIPITOR) 40 MG tablet Take 1 tablet by mouth once daily 07/07/23   Alba Cory, MD  Cholecalciferol (DIALYVITE VITAMIN D 5000) 125 MCG (5000 UT) capsule Take 5,000 Units by mouth daily.    [provider]   clopidogrel (PLAVIX) 75 MG tablet Take 1 tablet (75 mg total) by mouth daily. 08/18/23   Alba Cory, MD  Fluticasone-Umeclidin-Vilant (TRELEGY ELLIPTA) 100-62.5-25 MCG/ACT AEPB Inhale 1 puff into the lungs daily. 04/11/23   Alba Cory, MD  gabapentin (NEURONTIN) 100 MG capsule Take 1 capsule (100 mg total) by mouth 3 (three) times daily. 01/07/23   Alba Cory, MD  hydrALAZINE (APRESOLINE) 10 MG tablet Take 1 tablet (10 mg total) by mouth 3 (three) times daily. 07/15/23   Alba Cory, MD  Multiple Vitamin (MULTIVITAMIN WITH MINERALS) TABS tablet Take 1 tablet by mouth daily. 05/25/20   Love, Evlyn Kanner, PA-C  tadalafil (CIALIS) 20 MG tablet Take 0.5-1 tablets (10-20 mg total) by mouth every other day as needed for erectile dysfunction. 01/07/23   Alba Cory, MD  traZODone (DESYREL) 100 MG tablet Take 1 tablet (100 mg total) by mouth at bedtime. 04/11/23   Alba Cory, MD  triamcinolone cream (KENALOG) 0.1 % Apply 1 Application topically 2 (two) times daily. 07/15/23   Alba Cory, MD  valsartan (DIOVAN) 160 MG tablet Take 1 tablet (160 mg total) by mouth daily. BP and kidney disease 08/18/23   Alba Cory, MD      Allergies    Patient has no known allergies.    Review of Systems   Review of Systems  Musculoskeletal:  Positive for back pain.  All other systems reviewed and are negative.   Physical Exam Updated Vital Signs BP (!) 177/88 (  BP Location: Right Arm)   Pulse 66   Temp 98 F (36.7 C) (Oral)   Resp 18   Ht 5\' 8"  (1.727 m)   Wt 63.1 kg   SpO2 99%   BMI 21.15 kg/m  Physical Exam Vitals and nursing note reviewed.  Constitutional:      General: He is not in acute distress.    Appearance: He is well-developed.  HENT:     Head: Atraumatic.  Eyes:     Conjunctiva/sclera: Conjunctivae normal.  Cardiovascular:     Rate and Rhythm: Normal rate. Rhythm irregular.     Pulses: Normal pulses.     Heart sounds: Normal heart sounds.  Pulmonary:      Effort: Pulmonary effort is normal.     Breath sounds: Normal breath sounds. No wheezing, rhonchi or rales.  Abdominal:     Palpations: Abdomen is soft.     Tenderness: There is no abdominal tenderness. There is right CVA tenderness. There is no left CVA tenderness.     Comments: No abdominal bruit or pulsatile mass  Musculoskeletal:        General: Tenderness (Tenderness to right upper lumbar paraspinal region without any overlying skin changes no midline spine tenderness) present.     Cervical back: Neck supple.  Skin:    Findings: No rash.     Comments: Right BKA with orthotic.   Neurological:     Mental Status: He is alert.     ED Results / Procedures / Treatments   Labs (all labs ordered are listed, but only abnormal results are displayed) Labs Reviewed  COMPREHENSIVE METABOLIC PANEL - Abnormal; Notable for the following components:      Result Value   Creatinine, Ser 1.53 (*)    GFR, Estimated 50 (*)    All other components within normal limits  CBC - Abnormal; Notable for the following components:   RBC 4.07 (*)    HCT 38.3 (*)    All other components within normal limits  URINALYSIS, ROUTINE W REFLEX MICROSCOPIC - Abnormal; Notable for the following components:   Protein, ur 30 (*)    Bacteria, UA RARE (*)    All other components within normal limits  LIPASE, BLOOD    EKG None  Radiology CT Renal Stone Study Result Date: 08/21/2023 CLINICAL DATA:  Abdominal/flank pain, stone suspected. Right flank pain. EXAM: CT ABDOMEN AND PELVIS WITHOUT CONTRAST TECHNIQUE: Multidetector CT imaging of the abdomen and pelvis was performed following the standard protocol without IV contrast. RADIATION DOSE REDUCTION: This exam was performed according to the departmental dose-optimization program which includes automated exposure control, adjustment of the mA and/or kV according to patient size and/or use of iterative reconstruction technique. COMPARISON:  CT scan abdomen and pelvis  from 09/14/2019. FINDINGS: Lower chest: There is chronic scarring at the left lung base along with diaphragmatic pleural calcification and emphysematous changes. No significant interval change. The lung bases are otherwise clear. No pleural effusion. The heart is normal in size. No pericardial effusion. Hepatobiliary: The liver is normal in size. Non-cirrhotic configuration. No suspicious mass. No intrahepatic or extrahepatic bile duct dilation. There is a single 3 mm calcified gallstone without imaging signs of acute cholecystitis. Normal gallbladder wall thickness. No pericholecystic inflammatory changes. Pancreas: Unremarkable. No pancreatic ductal dilatation or surrounding inflammatory changes. Spleen: Within normal limits. No focal lesion. Adrenals/Urinary Tract: Adrenal glands are unremarkable. Small/atrophic right kidney again seen. No suspicious renal mass seen within the limitations of this unenhanced exam. No nephroureterolithiasis  or obstructive uropathy. Note is made of 3 mm vascular calcification in the region of left renal hilum. Urinary bladder is under distended, precluding optimal assessment. However, no large mass or stones identified. No perivesical fat stranding. Stomach/Bowel: There is a small sliding hiatal hernia. No disproportionate dilation of the small or large bowel loops. No evidence of abnormal bowel wall thickening or inflammatory changes. The appendix is unremarkable. Vascular/Lymphatic: No ascites or pneumoperitoneum. No abdominal or pelvic lymphadenopathy, by size criteria. No aneurysmal dilation of the major abdominal arteries. There are mild peripheral atherosclerotic vascular calcifications of the aorta and its major branches. Vascular stent noted in the right common/external iliac artery and left external iliac artery. Partially seen stent arising from the right superficial femoral artery and extending inferiorly into the right leg. Reproductive: Normal size prostate. Symmetric  seminal vesicles. Other: There is a tiny fat containing umbilical hernia. The soft tissues and abdominal wall are otherwise unremarkable. Musculoskeletal: No suspicious osseous lesions. There are mild multilevel degenerative changes in the visualized spine. IMPRESSION: 1. No nephroureterolithiasis or obstructive uropathy. 2. No acute inflammatory process identified within the abdomen or pelvis. 3. Multiple chronic observations (such as scarring, pleural calcification and emphysematous blebs at the left lung base, small cholelithiasis without acute cholecystitis, small/atrophic right kidney, small sliding hiatal hernia, vascular stents, etc.), As described above. 4. Aortic atherosclerosis. Aortic Atherosclerosis (ICD10-I70.0) and Emphysema (ICD10-J43.9). Electronically Signed   By: Jules Schick M.D.   On: 08/21/2023 16:08    Procedures Procedures    Medications Ordered in ED Medications  morphine (PF) 4 MG/ML injection 4 mg (4 mg Intravenous Given 08/21/23 2140)  sodium chloride 0.9 % bolus 1,000 mL (1,000 mLs Intravenous New Bag/Given 08/21/23 2139)  iohexol (OMNIPAQUE) 350 MG/ML injection 100 mL (100 mLs Intravenous Contrast Given 08/21/23 2240)    ED Course/ Medical Decision Making/ A&P                                 Medical Decision Making Amount and/or Complexity of Data Reviewed Radiology: ordered.  Risk Prescription drug management.   BP (!) 177/88 (BP Location: Right Arm)   Pulse 66   Temp 98 F (36.7 C) (Oral)   Resp 18   Ht 5\' 8"  (1.727 m)   Wt 63.1 kg   SpO2 99%   BMI 21.15 kg/m   37:48 PM  68 year old male with history of peripheral artery disease, right below the knee amputation, alcohol use, protein malnutrition, aortic aneurysm, cocaine disorder presenting complaining of flank pain.  Patient report atraumatic right flank pain that started yesterday morning.  Pain is described as a sharp nonradiating sensation, persistent, moderate in intensity.  Pain worsening with  movement.  No associated lightheadedness or dizziness no fever or chills no chest pain trouble breathing no abdominal pain no urinary symptoms no focal numbness or focal weakness and no trauma.  He denies having any rash.  No specific treatment tried.  He has never had this pain before.  He admits to tobacco use but denies any street drug use or alcohol use.  Denies IV drug use or active cancer.  On exam, patient is laying in bed appears to be in no acute discomfort.  He does have reproducible tenderness noted to the right flank region and right upper lumbar paraspinal region without any overlying skin changes.  He does not have any midline spine tenderness.  Has a right BKA.  There are  no abdominal pulsatile mass or bruit.  Vital signs notable for elevated blood pressure of 177/88.  Patient is afebrile no hypoxia.  -Labs ordered, independently viewed and interpreted by me.  Labs remarkable for creatinine of 1.53.  UA without signs of urine tract infection and no blood in the urine -The patient was maintained on a cardiac monitor.  I personally viewed and interpreted the cardiac monitored which showed an underlying rhythm of: nsr -Imaging independently viewed and interpreted by me and I agree with radiologist's interpretation.  Result remarkable for CT renal stone study without any acute finding however there are multiple chronic observation that were noted along with aortic atherosclerosis and vascular stenting as well as small sliding hiatal hernia.  However due to history of aortic aneurysm, will obtain CT angiogram of the chest abdomen pelvis to rule out aortic dissection. -This patient presents to the ED for concern of flank pain, this involves an extensive number of treatment options, and is a complaint that carries with it a high risk of complications and morbidity.  The differential diagnosis includes muscle skeletal strain, kidney stone, shingles, dissection, colitis, -Co morbidities that  complicate the patient evaluation includes PAD, aortic aneurysm, alcohol and cocaine use -Treatment includes morphine and IV fluid -Reevaluation of the patient after these medicines showed that the patient improved -PCP office notes or outside notes reviewed -Discussion with attending Dr. Eloise Harman. Pt sign out to oncoming team who will f/u on CT result, if negative pt can be d/c home with tylenol and muscle relaxant for reproducible back pain -Escalation to admission/observation considered: dispo pending          Final Clinical Impression(s) / ED Diagnoses Final diagnoses:  None    Rx / DC Orders ED Discharge Orders     None         Fayrene Helper, PA-C 08/21/23 2330    Rondel Baton, MD 08/25/23 1006

## 2023-08-21 NOTE — ED Triage Notes (Signed)
 Pt. Also reports that he has pain when he takes a deep breath in,  He is getting over an URI.

## 2023-08-21 NOTE — ED Provider Triage Note (Signed)
 Emergency Medicine Provider Triage Evaluation Note  Ryan [name] , a 68 y.o. male  was evaluated in triage.  Pt complains of right flank pain.  Began yesterday morning.  Denies radiation of pain.  States worsened with certain movements as well as taking a deep breath.  Denies history of kidney stones or history of pain similar.  Denies any urinary symptoms, fever..  Review of Systems  Positive: See above Negative:   Physical Exam  BP (!) 144/89 (BP Location: Left Arm)   Pulse 82   Temp 98.4 F (36.9 C)   Resp 18   Ht 5\' 8"  (1.727 m)   Wt 63.1 kg   SpO2 97%   BMI 21.15 kg/m  Gen:   Awake, no distress   Resp:  Normal effort  MSK:   Moves extremities without difficulty  Other:  CVA tenderness versus paraspinal tenderness right lumbar region.  Medical Decision Making  Medically screening exam initiated at 12:49 PM.  Appropriate orders placed.  Ryan [name] was informed that the remainder of the evaluation will be completed by another provider, this initial triage assessment does not replace that evaluation, and the importance of remaining in the ED until their evaluation is complete.     Peter Garter, [place] 08/21/23 1249

## 2023-08-22 MED ORDER — METHOCARBAMOL 500 MG PO TABS: 500.0000 mg | ORAL_TABLET | Freq: Two times a day (BID) | ORAL | 0 refills | Status: DC

## 2023-08-22 NOTE — Discharge Instructions (Signed)
 Your imaging tonight was reassuring. I have prescribed Robaxin, a muscle relaxant for your back pain. Do not drive while taking this medication as it will cause drowsiness. You may also take Tylenol for pain. Follow up with primary care as needed.

## 2023-08-22 NOTE — ED Notes (Signed)
 Dr Jarold Motto made aware of pt BP 201/98 and 208/98 and that pt stated he hasn't taken his home dose of bp med tonight since he has been here.

## 2023-08-22 NOTE — ED Notes (Signed)
 Pt requesting to leave at this time. MD and PA made aware.

## 2023-08-22 NOTE — ED Provider Notes (Signed)
  Physical Exam  BP (!) 208/98 (BP Location: Right Arm)   Pulse 72   Temp 98 F (36.7 C) (Oral)   Resp 18   Ht 5\' 8"  (1.727 m)   Wt 63.1 kg   SpO2 97%   BMI 21.15 kg/m   Physical Exam  Procedures  Procedures  ED Course / MDM    Medical Decision Making Amount and/or Complexity of Data Reviewed Radiology: ordered.  Risk Prescription drug management.   Patient care assumed from Fayrene Helper, PA-C at shift handoff.  See his note for full details.  In short, 68 year old male patient with history of peripheral artery disease, right BKA, alcohol use, protein malnutrition, aortic aneurysm, cocaine disorder presented to the emergency department complaining of right-sided flank pain with no known injury.  Pain worse with movement.  No urinary symptoms.  No weakness.  Patient with unremarkable renal stone study.  Due to patient's history of aortic aneurysm, CTA dissection study was ordered to rule out aortic dissection.  Plan to follow for CT results.  If CT is negative pain likely musculoskeletal in nature, will discharge with Tylenol and Robaxin for reproducible back pain.  Dissection study negative for acute dissection. Patient stable for discharge home. Rx for robaxin sent. Return precautions provided.      Pamala Duffel 08/22/23 0111    Mesner, Barbara Cower, MD 08/22/23 [phone]

## 2023-08-23 ENCOUNTER — Encounter: Payer: Self-pay | Admitting: Family Medicine

## 2023-08-25 ENCOUNTER — Telehealth: Payer: Self-pay

## 2023-08-25 NOTE — Transitions of Care (Post Inpatient/ED Visit) (Signed)
   08/25/2023  Name: Ryan [name] MRN: [id number] DOB: 1956-03-23  Today's TOC FU Call Status: Today's TOC FU Call Status:: Unsuccessful Call (1st Attempt) Unsuccessful Call (1st Attempt) Date: 08/25/23  Attempted to reach the patient regarding the most recent Inpatient/ED visit.  Follow Up Plan: Additional outreach attempts will be made to reach the patient to complete the Transitions of Care (Post Inpatient/ED visit) call.   Signature Agnes [name], CMA (AAMA)  [facility] [phone]

## 2023-08-27 NOTE — Progress Notes (Signed)
 Name: Ryan [name]   MRN: [id number]    DOB: Oct 28, 1955   Date:08/27/2023       Progress Note  Chief Complaint  Patient presents with   Hospitalization Follow-up     Subjective:   Ryan [name] is a 68 y.o. male, presents to clinic for ED f/up Pt new to me ED visit and results reviewed extensively Pt sent mychart msg asking about UTI/urine results and review of results He is concerned that there was bacteria in urine and he was not given abx, no urinary sx currently His back pain is improving some Left leg hurts with ambulation and he's trying to get back into [facility] He denies abd pain, no diarrhea  EXAM: CT ABDOMEN AND PELVIS WITHOUT CONTRAST   TECHNIQUE: Multidetector CT imaging of the abdomen and pelvis was performed following the standard protocol without IV contrast.   RADIATION DOSE REDUCTION: This exam was performed according to the departmental dose-optimization program which includes automated exposure control, adjustment of the mA and/or kV according to patient size and/or use of iterative reconstruction technique.   COMPARISON:  CT scan abdomen and pelvis from 09/14/2019.   FINDINGS: Lower chest: There is chronic scarring at the left lung base along with diaphragmatic pleural calcification and emphysematous changes. No significant interval change. The lung bases are otherwise clear. No pleural effusion. The heart is normal in size. No pericardial effusion.   Hepatobiliary: The liver is normal in size. Non-cirrhotic configuration. No suspicious mass. No intrahepatic or extrahepatic bile duct dilation. There is a single 3 mm calcified gallstone without imaging signs of acute cholecystitis. Normal gallbladder wall thickness. No pericholecystic inflammatory changes.   Pancreas: Unremarkable. No pancreatic ductal dilatation or surrounding inflammatory changes.   Spleen: Within normal limits. No focal lesion.   Adrenals/Urinary Tract:  Adrenal glands are unremarkable. Small/atrophic right kidney again seen. No suspicious renal mass seen within the limitations of this unenhanced exam. No nephroureterolithiasis or obstructive uropathy. Note is made of 3 mm vascular calcification in the region of left renal hilum. Urinary bladder is under distended, precluding optimal assessment. However, no large mass or stones identified. No perivesical fat stranding.   Stomach/Bowel: There is a small sliding hiatal hernia. No disproportionate dilation of the small or large bowel loops. No evidence of abnormal bowel wall thickening or inflammatory changes. The appendix is unremarkable.   Vascular/Lymphatic: No ascites or pneumoperitoneum. No abdominal or pelvic lymphadenopathy, by size criteria. No aneurysmal dilation of the major abdominal arteries. There are mild peripheral atherosclerotic vascular calcifications of the aorta and its major branches. Vascular stent noted in the right common/external iliac artery and left external iliac artery. Partially seen stent arising from the right superficial femoral artery and extending inferiorly into the right leg.   Reproductive: Normal size prostate. Symmetric seminal vesicles.   Other: There is a tiny fat containing umbilical hernia. The soft tissues and abdominal wall are otherwise unremarkable.   Musculoskeletal: No suspicious osseous lesions. There are mild multilevel degenerative changes in the visualized spine.   IMPRESSION: 1. No nephroureterolithiasis or obstructive uropathy. 2. No acute inflammatory process identified within the abdomen or pelvis. 3. Multiple chronic observations (such as scarring, pleural calcification and emphysematous blebs at the left lung base, small cholelithiasis without acute cholecystitis, small/atrophic right kidney, small sliding hiatal hernia, vascular stents, etc.), As described above. 4. Aortic atherosclerosis.   Aortic Atherosclerosis  (ICD10-I70.0) and Emphysema (ICD10-J43.9).     Electronically Signed   By: Timoteo Expose.D.  On: 08/21/2023 16:08  EXAM: CT ANGIOGRAPHY CHEST, ABDOMEN AND PELVIS   TECHNIQUE: Non-contrast CT of the chest was initially obtained.   Multidetector CT imaging through the chest, abdomen and pelvis was performed using the standard protocol during bolus administration of intravenous contrast. Multiplanar reconstructed images and MIPs were obtained and reviewed to evaluate the vascular anatomy.   RADIATION DOSE REDUCTION: This exam was performed according to the departmental dose-optimization program which includes automated exposure control, adjustment of the mA and/or kV according to patient size and/or use of iterative reconstruction technique.   CONTRAST:  OMNIPAQUE IOHEXOL 350 MG/ML SOLN   COMPARISON:  Same day CT of the abdomen and pelvis and CT chest 07/30/2023   FINDINGS: CTA CHEST FINDINGS   Cardiovascular: Cardiac motion degrades evaluation of the aortic root. No evidence of aortic dissection, intramural hematoma, or penetrating atherosclerotic ulcer. Normal caliber thoracic aorta. Mild aortic atherosclerotic plaque. No pericardial effusion.   Mediastinum/Nodes: Trachea and esophagus are unremarkable. No thoracic adenopathy.   Lungs/Pleura: Advanced emphysema with bullous change in the left upper and lower lobes. Scarring, volume loss, and architectural distortion in the left lower lobe. Partially calcified left pleural effusion/thickening, unchanged. Mild diffuse bronchial wall thickening. No pneumothorax.   Musculoskeletal: No acute fracture.   Review of the MIP images confirms the above findings.   CTA ABDOMEN AND PELVIS FINDINGS   VASCULAR   Aorta: No aneurysm or dissection. No hemodynamically significant stenosis.   Celiac: No aneurysm or dissection. No hemodynamically significant stenosis.   SMA: Severe stenosis of the proximal SMA. No  aneurysm or dissection.   Renals: Diminutive right renal artery with possible severe narrowing at the origin. Question beading of the both renal arteries which could be seen with fibromuscular dysplasia. Patent left renal artery without aneurysm or dissection.   IMA: Patent.   Inflow: Patent right common and external iliac artery stent. Patent left external iliac artery stent with moderate to severe narrowing in the distal stent.   Veins: No obvious venous abnormality within the limitations of this arterial phase study.   Review of the MIP images confirms the above findings.   NON-VASCULAR   Hepatobiliary: No focal liver abnormality is seen. No gallstones, gallbladder wall thickening, or biliary dilatation.   Pancreas: Unremarkable. No pancreatic ductal dilatation or surrounding inflammatory changes.   Spleen: Normal in size without focal abnormality.   Adrenals/Urinary Tract: Normal adrenal glands. No urinary calculi or hydronephrosis. Atrophic right kidney. Unremarkable bladder.   Stomach/Bowel: Stomach is within normal limits. Loops of small bowel in the left and central abdomen at the upper limits of normal in caliber without discrete transition point. Normal appendix.   Lymphatic: No lymphadenopathy.   Reproductive: No acute abnormality.   Other: No free intraperitoneal fluid or air.   Musculoskeletal: No acute fracture.   Review of the MIP images confirms the above findings.   IMPRESSION: 1. No evidence of aortic dissection or aneurysm. 2. Severe stenosis of the proximal SMA. 3. Diminutive right renal artery with possible severe narrowing at the origin. Question beading of the both renal arteries which could be seen with fibromuscular dysplasia. 4. Loops of small bowel in the left and central abdomen at the upper limits of normal in caliber without discrete transition point, favored to represent ileus. 5. Advanced emphysema with bullous change in the left  upper and lower lobes. 6. Scarring, volume loss, and architectural distortion in the left lower lobe. Partially calcified left pleural effusion/thickening, unchanged.     Electronically  Signed   By: Minerva Fester M.D.   On: 08/22/2023 00:19  Results for orders placed or performed during the hospital encounter of 08/21/23  Urinalysis, Routine w reflex microscopic -Urine, Clean Catch   Collection Time: 08/21/23 12:49 PM  Result Value Ref Range   Color, Urine YELLOW YELLOW   APPearance CLEAR CLEAR   Specific Gravity, Urine 1.012 1.005 - 1.030   pH 6.0 5.0 - 8.0   Glucose, UA NEGATIVE NEGATIVE mg/dL   Hgb urine dipstick NEGATIVE NEGATIVE   Bilirubin Urine NEGATIVE NEGATIVE   Ketones, ur NEGATIVE NEGATIVE mg/dL   Protein, ur 30 (A) NEGATIVE mg/dL   Nitrite NEGATIVE NEGATIVE   Leukocytes,Ua NEGATIVE NEGATIVE   RBC / HPF 0-5 0 - 5 RBC/hpf   WBC, UA 0-5 0 - 5 WBC/hpf   Bacteria, UA RARE (A) NONE SEEN   Squamous Epithelial / HPF 0-5 0 - 5 /HPF   Mucus PRESENT   Comprehensive metabolic panel   Collection Time: 08/21/23  1:20 PM  Result Value Ref Range   Sodium 140 135 - 145 mmol/L   Potassium 4.2 3.5 - 5.1 mmol/L   Chloride 107 98 - 111 mmol/L   CO2 22 22 - 32 mmol/L   Glucose, Bld 93 70 - 99 mg/dL   BUN 19 8 - 23 mg/dL   Creatinine, Ser 2.53 (H) 0.61 - 1.24 mg/dL   Calcium 9.1 8.9 - 66.4 mg/dL   Total Protein 7.2 6.5 - 8.1 g/dL   Albumin 3.9 3.5 - 5.0 g/dL   AST 19 15 - 41 U/L   ALT 16 0 - 44 U/L   Alkaline Phosphatase 81 38 - 126 U/L   Total Bilirubin 0.7 0.0 - 1.2 mg/dL   GFR, Estimated 50 (L) >60 mL/min   Anion gap 11 5 - 15  CBC   Collection Time: 08/21/23  1:20 PM  Result Value Ref Range   WBC 6.1 4.0 - 10.5 K/uL   RBC 4.07 (L) 4.22 - 5.81 MIL/uL   Hemoglobin 13.0 13.0 - 17.0 g/dL   HCT 40.3 (L) 47.4 - 25.9 %   MCV 94.1 80.0 - 100.0 fL   MCH 31.9 26.0 - 34.0 pg   MCHC 33.9 30.0 - 36.0 g/dL   RDW 56.3 87.5 - 64.3 %   Platelets 216 150 - 400 K/uL   nRBC 0.0  0.0 - 0.2 %  Lipase, blood   Collection Time: 08/21/23  1:20 PM  Result Value Ref Range   Lipase 30 11 - 51 U/L        Current Outpatient Medications:    albuterol (VENTOLIN HFA) 108 (90 Base) MCG/ACT inhaler, Inhale 2 puffs into the lungs every 6 (six) hours as needed for wheezing or shortness of breath., Disp: 18 g, Rfl: 0   ascorbic acid (VITAMIN C) 500 MG tablet, Take 1 tablet (500 mg total) by mouth daily., Disp: 30 tablet, Rfl: 0   aspirin EC 81 MG tablet, Take 1 tablet (81 mg total) by mouth daily., Disp: 90 tablet, Rfl: 3   atorvastatin (LIPITOR) 40 MG tablet, Take 1 tablet by mouth once daily, Disp: 90 tablet, Rfl: 0   Cholecalciferol (DIALYVITE VITAMIN D 5000) 125 MCG (5000 UT) capsule, Take 5,000 Units by mouth daily., Disp: , Rfl:    clopidogrel (PLAVIX) 75 MG tablet, Take 1 tablet (75 mg total) by mouth daily., Disp: 90 tablet, Rfl: 1   Fluticasone-Umeclidin-Vilant (TRELEGY ELLIPTA) 100-62.5-25 MCG/ACT AEPB, Inhale 1 puff into the lungs daily.,  Disp: 180 each, Rfl: 1   gabapentin (NEURONTIN) 100 MG capsule, Take 1 capsule (100 mg total) by mouth 3 (three) times daily., Disp: 270 capsule, Rfl: 1   hydrALAZINE (APRESOLINE) 10 MG tablet, Take 1 tablet (10 mg total) by mouth 3 (three) times daily., Disp: 90 tablet, Rfl: 0   methocarbamol (ROBAXIN) 500 MG tablet, Take 1 tablet (500 mg total) by mouth 2 (two) times daily., Disp: 20 tablet, Rfl: 0   Multiple Vitamin (MULTIVITAMIN WITH MINERALS) TABS tablet, Take 1 tablet by mouth daily., Disp: , Rfl:    tadalafil (CIALIS) 20 MG tablet, Take 0.5-1 tablets (10-20 mg total) by mouth every other day as needed for erectile dysfunction., Disp: 10 tablet, Rfl: 2   traZODone (DESYREL) 100 MG tablet, Take 1 tablet (100 mg total) by mouth at bedtime., Disp: 90 tablet, Rfl: 1   triamcinolone cream (KENALOG) 0.1 %, Apply 1 Application topically 2 (two) times daily., Disp: 454 g, Rfl: 0   valsartan (DIOVAN) 160 MG tablet, Take 1 tablet (160 mg  total) by mouth daily. BP and kidney disease, Disp: 90 tablet, Rfl: 0  Patient Active Problem List   Diagnosis Date Noted   History of colonic polyps 08/08/2023   Polyp of ascending colon 08/08/2023   Cocaine use disorder, mild, in early remission (HCC) 08/27/2022   History of alcoholism (HCC) 01/24/2022   History of amputation below knee, right (HCC) 01/24/2022   Moderate protein malnutrition (HCC) 01/24/2022   Benign hypertension with chronic kidney disease, stage III (HCC) 01/24/2022   Aneurysm of ascending aorta without rupture (HCC) 01/24/2022   Stage 3a chronic kidney disease (HCC) 01/24/2022   Phantom pain after amputation of lower extremity (HCC)    Below-knee amputation of right lower extremity (HCC) 05/17/2020   PAD (peripheral artery disease) (HCC) 07/21/2018   B12 deficiency 03/12/2018   Vitamin B1 deficiency 03/12/2018   Varicose veins of leg with pain, right 03/12/2018   Enlarged thoracic aorta (HCC) 02/24/2018   Paresthesia 02/24/2018   Emphysema lung (HCC) 02/20/2018   Atherosclerosis of aorta (HCC) 02/20/2018   Hypertension, benign 08/31/2015   Tobacco use 08/31/2015    Past Surgical History:  Procedure Laterality Date   ABDOMINAL AORTOGRAM W/LOWER EXTREMITY N/A 09/13/2019   Procedure: ABDOMINAL AORTOGRAM W/LOWER EXTREMITY;  Surgeon: Maeola Harman, MD;  Location: [facility] INVASIVE CV LAB;  Service: Cardiovascular;  Laterality: N/A;   AMPUTATION Right 05/09/2020   Procedure: RIGHT BELOW KNEE AMPUTATION;  Surgeon: Maeola Harman, MD;  Location[facility] OR;  Service: Vascular;  Laterality: Right;  w/ a block   COLONOSCOPY WITH PROPOFOL N/A 02/15/2016   Procedure: COLONOSCOPY WITH PROPOFOL;  Surgeon: [name], MD;  Location: [facility];  Service: Endoscopy;  Laterality: N/A;   COLONOSCOPY WITH PROPOFOL N/A 08/08/2023   Procedure: COLONOSCOPY WITH PROPOFOL;  Surgeon: [name], MD;  Location: [facility] ENDOSCOPY;  Service: Endoscopy;  Laterality:  N/A;   FEMORAL-POPLITEAL BYPASS GRAFT Right 09/14/2019   Procedure: BYPASS GRAFT FEMORAL-POPLITEAL ARTERY;  Surgeon: Maeola Harman, MD;  Location: [facility] OR;  Service: Vascular;  Laterality: Right;   HERNIA REPAIR  1999   left inguinal   INSERTION OF ILIAC STENT Right 09/14/2019   Procedure: Insertion Of Common and External Iliac Stent;  Surgeon: Maeola Harman, MD;  Location: [facility] OR;  Service: Vascular;  Laterality: Right;   LOWER EXTREMITY ANGIOGRAPHY Right 05/07/2018   Procedure: LOWER EXTREMITY ANGIOGRAPHY;  Surgeon: Annice Needy, MD;  Location: [facility] INVASIVE CV LAB;  Service:  Cardiovascular;  Laterality: Right;   LOWER EXTREMITY ANGIOGRAPHY Right 06/25/2018   Procedure: LOWER EXTREMITY ANGIOGRAPHY;  Surgeon: Annice Needy, MD;  Location: [facility] INVASIVE CV LAB;  Service: Cardiovascular;  Laterality: Right;   LOWER EXTREMITY ANGIOGRAPHY Right 07/01/2018   Procedure: LOWER EXTREMITY ANGIOGRAPHY;  Surgeon: Annice Needy, MD;  Location: [facility] INVASIVE CV LAB;  Service: Cardiovascular;  Laterality: Right;   LOWER EXTREMITY ANGIOGRAPHY Right 08/12/2018   Procedure: LOWER EXTREMITY ANGIOGRAPHY;  Surgeon: Annice Needy, MD;  Location: [facility] INVASIVE CV LAB;  Service: Cardiovascular;  Laterality: Right;   LOWER EXTREMITY ANGIOGRAPHY Right 09/03/2018   Procedure: LOWER EXTREMITY ANGIOGRAPHY;  Surgeon: Annice Needy, MD;  Location: [facility] INVASIVE CV LAB;  Service: Cardiovascular;  Laterality: Right;   LOWER EXTREMITY ANGIOGRAPHY Right 09/04/2018   Procedure: Lower Extremity Angiography;  Surgeon: Annice Needy, MD;  Location: [facility] INVASIVE CV LAB;  Service: Cardiovascular;  Laterality: Right;   LOWER EXTREMITY ANGIOGRAPHY Right 10/01/2018   Procedure: LOWER EXTREMITY ANGIOGRAPHY;  Surgeon: Annice Needy, MD;  Location: [facility] INVASIVE CV LAB;  Service: Cardiovascular;  Laterality: Right;   LOWER EXTREMITY ANGIOGRAPHY Right 10/01/2018   Procedure: Lower Extremity Angiography;  Surgeon: Annice Needy, MD;   Location: [facility] INVASIVE CV LAB;  Service: Cardiovascular;  Laterality: Right;   LOWER EXTREMITY ANGIOGRAPHY Right 10/15/2018   Procedure: LOWER EXTREMITY ANGIOGRAPHY;  Surgeon: Annice Needy, MD;  Location: [facility] INVASIVE CV LAB;  Service: Cardiovascular;  Laterality: Right;   LOWER EXTREMITY ANGIOGRAPHY Left 10/16/2018   Procedure: Lower Extremity Angiography;  Surgeon: Annice Needy, MD;  Location: [facility] INVASIVE CV LAB;  Service: Cardiovascular;  Laterality: Left;   LOWER EXTREMITY ANGIOGRAPHY Left 01/20/2019   Procedure: LOWER EXTREMITY ANGIOGRAPHY;  Surgeon: Annice Needy, MD;  Location: [facility] INVASIVE CV LAB;  Service: Cardiovascular;  Laterality: Left;   LOWER EXTREMITY ANGIOGRAPHY Right 01/21/2019   Procedure: Lower Extremity Angiography;  Surgeon: Annice Needy, MD;  Location: [facility] INVASIVE CV LAB;  Service: Cardiovascular;  Laterality: Right;   LOWER EXTREMITY ANGIOGRAPHY Right 01/28/2019   Procedure: LOWER EXTREMITY ANGIOGRAPHY;  Surgeon: Annice Needy, MD;  Location: [facility] INVASIVE CV LAB;  Service: Cardiovascular;  Laterality: Right;   LOWER EXTREMITY ANGIOGRAPHY Right 01/29/2019   Procedure: Lower Extremity Angiography;  Surgeon: Annice Needy, MD;  Location: [facility] INVASIVE CV LAB;  Service: Cardiovascular;  Laterality: Right;   LOWER EXTREMITY ANGIOGRAPHY Right 04/04/2020   Procedure: LOWER EXTREMITY ANGIOGRAPHY;  Surgeon: Maeola Harman, MD;  Location: [facility] INVASIVE CV LAB;  Service: Cardiovascular;  Laterality: Right;   LOWER EXTREMITY INTERVENTION Right 07/02/2018   Procedure: LOWER EXTREMITY INTERVENTION;  Surgeon: Annice Needy, MD;  Location: [facility] INVASIVE CV LAB;  Service: Cardiovascular;  Laterality: Right;   PERIPHERAL VASCULAR BALLOON ANGIOPLASTY Left 04/04/2020   Procedure: PERIPHERAL VASCULAR BALLOON ANGIOPLASTY;  Surgeon: Maeola Harman, MD;  Location: [facility] INVASIVE CV LAB;  Service: Cardiovascular;  Laterality: Left;  external iliac   POLYPECTOMY N/A 02/15/2016    Procedure: POLYPECTOMY;  Surgeon: [name], MD;  Location: [facility] SUR[facility]ERY [facility];  Service: Endoscopy;  Laterality: N/A;   POLYPECTOMY  08/08/2023   Procedure: POLYPECTOMY INTESTINAL;  Surgeon: [name], MD;  Location: [facility] ENDOSCOPY;  Service: Endoscopy;;    Family History  Problem Relation Age of Onset   COPD Mother    Hypertension Father    Heart attack Brother     Social History   Tobacco Use   Smoking status: Every Day    Current  packs/day: 0.00    Average packs/day: 0.5 packs/day for 40.4 years (20.2 ttl pk-yrs)    Types: Cigarettes    Start date: 07/21/1978    Last attempt to quit: 12/2018    Years since quitting: 4.6    Passive exposure: Never   Smokeless tobacco: Never   Tobacco comments:    Started using cocaine recently but states he will try to quit   Vaping Use   Vaping status: Never Used  Substance Use Topics   Alcohol use: Not Currently    Alcohol/week: 12.0 standard drinks of alcohol    Types: 12 Cans of beer per week    Comment: 4 beers / week now per pt 05/05/20   Drug use: Not Currently    Frequency: 2.0 times per week    Types: Cocaine    Comment: sept 2024     No Known Allergies  Health Maintenance  Topic Date Due   Medicare Annual Wellness (AWV)  Never done   COVID-19 Vaccine (3 - 2024-25 season) 09/03/2023 (Originally 02/23/2023)   Lung Cancer Screening  08/20/2024   Colonoscopy  08/07/2028   DTaP/Tdap/Td (2 - Td or Tdap) 01/18/2031   Pneumonia Vaccine 21+ Years old  Completed   INFLUENZA VACCINE  Completed   Hepatitis C Screening  Completed   Zoster Vaccines- Shingrix  Completed   HPV VACCINES  Aged Out    Chart Review Today: I personally reviewed active problem list, medication list, allergies, family history, social history, health maintenance, notes from last encounter, lab results, imaging with the patient/caregiver today.    Review of Systems  Constitutional: Negative.   HENT: Negative.    Eyes: Negative.   Respiratory:  Negative.    Cardiovascular: Negative.   Gastrointestinal: Negative.   Endocrine: Negative.   Genitourinary: Negative.   Musculoskeletal: Negative.   Skin: Negative.   Allergic/Immunologic: Negative.   Neurological: Negative.   Hematological: Negative.   Psychiatric/Behavioral: Negative.    All other systems reviewed and are negative.    Objective:   There were no vitals filed for this visit.  There is no height or weight on file to calculate BMI.  Physical Exam Vitals and nursing note reviewed.  Constitutional:      Appearance: He is well-developed.  HENT:     Head: Normocephalic and atraumatic.     Nose: Nose normal.  Eyes:     General:        Right eye: No discharge.        Left eye: No discharge.     Conjunctiva/sclera: Conjunctivae normal.  Neck:     Trachea: No tracheal deviation.  Cardiovascular:     Rate and Rhythm: Normal rate and regular rhythm.  Pulmonary:     Effort: Pulmonary effort is normal. No respiratory distress.     Breath sounds: No stridor.  Abdominal:     General: Bowel sounds are normal. There is no distension.     Palpations: Abdomen is soft.     Tenderness: There is no abdominal tenderness. There is no right CVA tenderness, left CVA tenderness or guarding.  Musculoskeletal:     Comments: Right LE prosthetic  Skin:    General: Skin is warm and dry.     Findings: No rash.  Neurological:     Mental Status: He is alert.     Motor: No abnormal muscle tone.     Coordination: Coordination normal.  Psychiatric:        Behavior: Behavior normal.  Functional Status Survey:   Results for orders placed or performed during the hospital encounter of 08/21/23  Urinalysis, Routine w reflex microscopic -Urine, Clean Catch   Collection Time: 08/21/23 12:49 PM  Result Value Ref Range   Color, Urine YELLOW YELLOW   APPearance CLEAR CLEAR   Specific Gravity, Urine 1.012 1.005 - 1.030   pH 6.0 5.0 - 8.0   Glucose, UA NEGATIVE NEGATIVE mg/dL    Hgb urine dipstick NEGATIVE NEGATIVE   Bilirubin Urine NEGATIVE NEGATIVE   Ketones, ur NEGATIVE NEGATIVE mg/dL   Protein, ur 30 (A) NEGATIVE mg/dL   Nitrite NEGATIVE NEGATIVE   Leukocytes,Ua NEGATIVE NEGATIVE   RBC / HPF 0-5 0 - 5 RBC/hpf   WBC, UA 0-5 0 - 5 WBC/hpf   Bacteria, UA RARE (A) NONE SEEN   Squamous Epithelial / HPF 0-5 0 - 5 /HPF   Mucus PRESENT   Comprehensive metabolic panel   Collection Time: 08/21/23  1:20 PM  Result Value Ref Range   Sodium 140 135 - 145 mmol/L   Potassium 4.2 3.5 - 5.1 mmol/L   Chloride 107 98 - 111 mmol/L   CO2 22 22 - 32 mmol/L   Glucose, Bld 93 70 - 99 mg/dL   BUN 19 8 - 23 mg/dL   Creatinine, Ser 2.95 (H) 0.61 - 1.24 mg/dL   Calcium 9.1 8.9 - 62.1 mg/dL   Total Protein 7.2 6.5 - 8.1 g/dL   Albumin 3.9 3.5 - 5.0 g/dL   AST 19 15 - 41 U/L   ALT 16 0 - 44 U/L   Alkaline Phosphatase 81 38 - 126 U/L   Total Bilirubin 0.7 0.0 - 1.2 mg/dL   GFR, Estimated 50 (L) >60 mL/min   Anion gap 11 5 - 15  CBC   Collection Time: 08/21/23  1:20 PM  Result Value Ref Range   WBC 6.1 4.0 - 10.5 K/uL   RBC 4.07 (L) 4.22 - 5.81 MIL/uL   Hemoglobin 13.0 13.0 - 17.0 g/dL   HCT 30.8 (L) 65.7 - 84.6 %   MCV 94.1 80.0 - 100.0 fL   MCH 31.9 26.0 - 34.0 pg   MCHC 33.9 30.0 - 36.0 g/dL   RDW 96.2 95.2 - 84.1 %   Platelets 216 150 - 400 K/uL   nRBC 0.0 0.0 - 0.2 %  Lipase, blood   Collection Time: 08/21/23  1:20 PM  Result Value Ref Range   Lipase 30 11 - 51 U/L      Assessment & Plan:     ICD-10-CM   1. Abdominal pain, unspecified abdominal location  R10.9 Urine Culture    Urinalysis, Routine w reflex microscopic   no current pain or sx, recheck urine    2. Back pain, unspecified back location, unspecified back pain laterality, unspecified chronicity  M54.9    some improvement with musclerelaxers    3. Flank pain  R10.9 Urine Culture    Urinalysis, Routine w reflex microscopic   improved, no CVA ttp on exam    4. Abnormal urinalysis  R82.90  Urine Culture    Urinalysis, Routine w reflex microscopic    Urine cytology ancillary only   recheck, no urinary sx today    5. Encounter for examination following treatment at hospital  Z09    ER records and results thoroughly reviewed with pt    6. PAD (peripheral artery disease) (HCC)  I73.9 Ambulatory referral to Vascular Surgery   known PAD    7. Stage  3a chronic kidney disease (HCC)  N18.31     8. Acute right-sided low back pain without sciatica  M54.50     9. Left leg claudication (HCC)  I73.9 Ambulatory referral to Vascular Surgery   Pt reports increasing calf and leg pain with ambulation, he is hoping to get sooner appt with vascular but states he needs referral    10. 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

## 2023-08-28 ENCOUNTER — Other Ambulatory Visit (HOSPITAL_COMMUNITY)
Admission: RE | Admit: 2023-08-28 | Discharge: 2023-08-28 | Disposition: A | Discharge status: Home / Self Care | Source: Ambulatory Visit | Attending: Family Medicine | Admitting: Family Medicine

## 2023-08-28 ENCOUNTER — Encounter: Payer: Self-pay | Admitting: Family Medicine

## 2023-08-28 ENCOUNTER — Ambulatory Visit (INDEPENDENT_AMBULATORY_CARE_PROVIDER_SITE_OTHER): Admitting: Family Medicine

## 2023-08-28 VITALS — BP 136/76 | HR 80 | Resp 16 | Ht 68.0 in | Wt 138.0 lb

## 2023-08-28 DIAGNOSIS — R109 Unspecified abdominal pain: Secondary | ICD-10-CM | POA: Diagnosis not present

## 2023-08-28 DIAGNOSIS — R8290 Unspecified abnormal findings in urine: Secondary | ICD-10-CM

## 2023-08-28 DIAGNOSIS — K551 Chronic vascular disorders of intestine: Secondary | ICD-10-CM

## 2023-08-28 DIAGNOSIS — M549 Dorsalgia, unspecified: Secondary | ICD-10-CM

## 2023-08-28 DIAGNOSIS — I739 Peripheral vascular disease, unspecified: Secondary | ICD-10-CM

## 2023-08-28 DIAGNOSIS — M545 Low back pain, unspecified: Secondary | ICD-10-CM

## 2023-08-28 DIAGNOSIS — Z09 Encounter for follow-up examination after completed treatment for conditions other than malignant neoplasm: Secondary | ICD-10-CM | POA: Diagnosis not present

## 2023-08-28 DIAGNOSIS — N1831 Chronic kidney disease, stage 3a: Secondary | ICD-10-CM

## 2023-08-28 NOTE — Patient Instructions (Signed)
 I will call you with your urine results and let you know if any medications are needed.  I will also send some of your symptoms and recent test info to [facility] vascular to see if they can see you sooner than your next appointment.

## 2023-08-29 ENCOUNTER — Encounter: Payer: Self-pay | Admitting: Family Medicine

## 2023-08-29 LAB — URINALYSIS, ROUTINE W REFLEX MICROSCOPIC
Bacteria, UA: NONE SEEN /HPF
Bilirubin Urine: NEGATIVE
Glucose, UA: NEGATIVE
Hgb urine dipstick: NEGATIVE
Hyaline Cast: NONE SEEN /LPF
Ketones, ur: NEGATIVE
Leukocytes,Ua: NEGATIVE
Nitrite: NEGATIVE
RBC / HPF: NONE SEEN /HPF (ref 0–2)
Specific Gravity, Urine: 1.013 (ref 1.001–1.035)
Squamous Epithelial / HPF: NONE SEEN /HPF (ref <=5)
WBC, UA: NONE SEEN /HPF (ref 0–5)
pH: 5.5 (ref 5.0–8.0)

## 2023-08-29 LAB — URINE CULTURE
MICRO NUMBER:: 16168742
Result:: NO GROWTH
SPECIMEN QUALITY:: ADEQUATE

## 2023-08-29 LAB — URINE CYTOLOGY ANCILLARY ONLY
Chlamydia: NEGATIVE
Comment: NEGATIVE
Comment: NEGATIVE
Comment: NORMAL
Neisseria Gonorrhea: NEGATIVE
Trichomonas: NEGATIVE

## 2023-08-29 LAB — MICROSCOPIC MESSAGE

## 2023-09-01 ENCOUNTER — Ambulatory Visit: Payer: 59

## 2023-09-01 VITALS — BP 146/84

## 2023-09-01 DIAGNOSIS — I129 Hypertensive chronic kidney disease with stage 1 through stage 4 chronic kidney disease, or unspecified chronic kidney disease: Secondary | ICD-10-CM

## 2023-09-01 DIAGNOSIS — N1831 Chronic kidney disease, stage 3a: Secondary | ICD-10-CM

## 2023-09-01 DIAGNOSIS — K551 Chronic vascular disorders of intestine: Secondary | ICD-10-CM | POA: Insufficient documentation

## 2023-09-01 NOTE — Progress Notes (Signed)
 Patient is in office today for a nurse visit for Blood Pressure Check. Patient blood pressure was 146/84, Patient No chest pain, No shortness of breath, No dyspnea on exertion, No orthopnea, No paroxysmal nocturnal dyspnea, No edema, No palpitations, No syncope. Patient verbalized has been taking medication as instructed, has not taken medicine this morning yet.  Per Note: HTN: he is taking Valsartan 80 mg and instead of taking hydralazine 10 mg prn , he is taking twice daily, advised to stop hydralazine and take valsartan 160 mg , hydralazine is prn only if bp spikes. No chest pain or palpitation

## 2023-09-02 MED ORDER — VALSARTAN 320 MG PO TABS: 320.0000 mg | ORAL_TABLET | Freq: Every day | ORAL | 0 refills | Status: DC

## 2023-09-02 NOTE — Addendum Note (Signed)
 Addended by: Ruel Favors on: 09/02/2023 08:56 AM   Modules accepted: Orders

## 2023-09-02 NOTE — Progress Notes (Signed)
 Pt notified and said PCP had called him already.

## 2023-09-04 NOTE — Progress Notes (Deleted)
      [address]       [place] [address]             [phone]        Ryan [name] [id number] April 01, 1956   History of Present Illness:  Ryan [name] is a 68 yo male with known history of PVD, HTN, HLD, and CKD. He has been referred to our office for aneurysm surveillance. 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      Current Outpatient Medications on File Prior to Visit  Medication Sig Dispense Refill   albuterol (VENTOLIN HFA) 108 (90 Base) MCG/ACT inhaler Inhale 2 puffs into the lungs every 6 (six) hours as needed for wheezing or shortness of breath. 18 g 0   ascorbic acid (VITAMIN C) 500 MG tablet Take 1 tablet (500 mg total) by mouth daily. 30 tablet 0   aspirin EC 81 MG tablet Take 1 tablet (81 mg total) by mouth daily. 90 tablet 3   atorvastatin (LIPITOR) 40 MG tablet Take 1 tablet by mouth once daily 90 tablet 0   Cholecalciferol (DIALYVITE VITAMIN D 5000) 125 MCG (5000 UT) capsule Take 5,000 Units by mouth daily.     clopidogrel (PLAVIX) 75 MG tablet Take 1 tablet (75 mg total) by mouth daily. 90 tablet 1   Fluticasone-Umeclidin-Vilant (TRELEGY ELLIPTA) 100-62.5-25 MCG/ACT AEPB Inhale 1 puff into the lungs daily. 180 each 1   gabapentin (NEURONTIN) 100 MG capsule Take 1 capsule (100 mg total) by mouth 3 (three) times daily. 270 capsule 1   hydrALAZINE (APRESOLINE) 10 MG tablet Take 1 tablet (10 mg total) by mouth 3 (three) times daily. 90 tablet 0   methocarbamol (ROBAXIN) 500 MG tablet Take 1 tablet (500 mg total) by mouth 2 (two) times daily. 20 tablet 0   Multiple Vitamin (MULTIVITAMIN WITH MINERALS) TABS tablet Take 1 tablet by mouth daily.     tadalafil (CIALIS) 20 MG tablet Take 0.5-1 tablets (10-20 mg total) by mouth every other day as needed for erectile dysfunction. 10 tablet 2   traZODone (DESYREL) 100 MG tablet Take 1 tablet (100 mg total) by mouth at bedtime. 90  tablet 1   triamcinolone cream (KENALOG) 0.1 % Apply 1 Application topically 2 (two) times daily. 454 g 0   valsartan (DIOVAN) 320 MG tablet Take 1 tablet (320 mg total) by mouth daily. BP and kidney disease 90 tablet 0   No current facility-administered medications on file prior to visit.     There were no vitals taken for this visit.  Physical Exam  CTA Results:      A/P:      Risk Modification:  Statin:  ***  Smoking cessation instruction/counseling given:  {CHL AMB [facility] SMOKING CESSATION COUNSELING:20758}  Patient was counseled on importance of Blood Pressure Control.  Despite Medical intervention if the patient notices persistently elevated blood pressure readings.  They are instructed to contact their Primary Care Physician  Please avoid use of Fluoroquinolones as this can potentially increase your risk of Aortic Rupture and/or Dissection  Patient educated on signs and symptoms of Aortic Dissection, handout also provided in AVS  Madelene Kaatz, PA-C 09/04/23

## 2023-09-09 ENCOUNTER — Other Ambulatory Visit: Payer: Self-pay

## 2023-09-09 ENCOUNTER — Emergency Department (HOSPITAL_COMMUNITY)
Admission: EM | Admit: 2023-09-09 | Discharge: 2023-09-10 | Disposition: A | Discharge status: Home / Self Care | Attending: Emergency Medicine | Admitting: Emergency Medicine

## 2023-09-09 ENCOUNTER — Emergency Department (HOSPITAL_COMMUNITY)

## 2023-09-09 DIAGNOSIS — J441 Chronic obstructive pulmonary disease with (acute) exacerbation: Secondary | ICD-10-CM | POA: Diagnosis not present

## 2023-09-09 DIAGNOSIS — I129 Hypertensive chronic kidney disease with stage 1 through stage 4 chronic kidney disease, or unspecified chronic kidney disease: Secondary | ICD-10-CM | POA: Diagnosis not present

## 2023-09-09 DIAGNOSIS — Z7982 Long term (current) use of aspirin: Secondary | ICD-10-CM | POA: Diagnosis not present

## 2023-09-09 DIAGNOSIS — R739 Hyperglycemia, unspecified: Secondary | ICD-10-CM

## 2023-09-09 DIAGNOSIS — F17210 Nicotine dependence, cigarettes, uncomplicated: Secondary | ICD-10-CM | POA: Insufficient documentation

## 2023-09-09 DIAGNOSIS — N189 Chronic kidney disease, unspecified: Secondary | ICD-10-CM | POA: Insufficient documentation

## 2023-09-09 DIAGNOSIS — R7309 Other abnormal glucose: Secondary | ICD-10-CM | POA: Diagnosis not present

## 2023-09-09 DIAGNOSIS — N289 Disorder of kidney and ureter, unspecified: Secondary | ICD-10-CM

## 2023-09-09 DIAGNOSIS — R0602 Shortness of breath: Secondary | ICD-10-CM | POA: Diagnosis present

## 2023-09-09 LAB — CBC WITH DIFFERENTIAL/PLATELET
Abs Immature Granulocytes: 0.02 10*3/uL (ref 0.00–0.07)
Basophils Absolute: 0 10*3/uL (ref 0.0–0.1)
Basophils Relative: 0 %
Eosinophils Absolute: 0 10*3/uL (ref 0.0–0.5)
Eosinophils Relative: 0 %
HCT: 37.7 % — ABNORMAL LOW (ref 39.0–52.0)
Hemoglobin: 13 g/dL (ref 13.0–17.0)
Immature Granulocytes: 0 %
Lymphocytes Relative: 12 %
Lymphs Abs: 1 10*3/uL (ref 0.7–4.0)
MCH: 31.9 pg (ref 26.0–34.0)
MCHC: 34.5 g/dL (ref 30.0–36.0)
MCV: 92.6 fL (ref 80.0–100.0)
Monocytes Absolute: 0.5 10*3/uL (ref 0.1–1.0)
Monocytes Relative: 6 %
Neutro Abs: 6.8 10*3/uL (ref 1.7–7.7)
Neutrophils Relative %: 82 %
Platelets: 191 10*3/uL (ref 150–400)
RBC: 4.07 MIL/uL — ABNORMAL LOW (ref 4.22–5.81)
RDW: 11.9 % (ref 11.5–15.5)
WBC: 8.4 10*3/uL (ref 4.0–10.5)
nRBC: 0 % (ref 0.0–0.2)

## 2023-09-09 MED ORDER — IPRATROPIUM-ALBUTEROL 0.5-2.5 (3) MG/3ML IN SOLN
3.0000 mL | Freq: Once | RESPIRATORY_TRACT | Status: AC
  Administered 2023-09-10: 3 mL via RESPIRATORY_TRACT
  Filled 2023-09-09: qty 3

## 2023-09-09 MED ORDER — METHYLPREDNISOLONE SODIUM SUCC 125 MG IJ SOLR
125.0000 mg | Freq: Once | INTRAMUSCULAR | Status: AC
  Administered 2023-09-09: 125 mg via INTRAVENOUS
  Filled 2023-09-09: qty 2

## 2023-09-09 NOTE — ED Provider Notes (Signed)
 [place] EMERGENCY DEPARTMENT AT [facility] Provider Note   CSN: [id number] Arrival date & time: 09/09/23  2309     History {Add pertinent medical, surgical, social history, OB history to HPI:1} Chief Complaint  Patient presents with   Shortness of Breath    Ryan [name] is a 68 y.o. male.  The history is provided by the patient and the EMS personnel.  Shortness of Breath He has history of hypertension, hyperlipidemia, chronic kidney disease, peripheral vascular disease and comes in because of shortness of breath which started about 2 hours ago.  He had the shingles vaccine this afternoon.  Tonight he also had chills.  He denies chest pain, heaviness, tightness, pressure.  Denies any nausea or vomiting or diaphoresis.  He has had a nonproductive cough.  He denies any sick contacts.  EMS treated him with albuterol nebulizer with and he states he feels significantly better following that.  Of note, he is still smoking 1/3 pack of cigarettes a day.   Home Medications Prior to Admission medications   Medication Sig Start Date End Date Taking? Authorizing Provider  albuterol (VENTOLIN HFA) 108 (90 Base) MCG/ACT inhaler Inhale 2 puffs into the lungs every 6 (six) hours as needed for wheezing or shortness of breath. 04/11/23   Alba Cory, MD  ascorbic acid (VITAMIN C) 500 MG tablet Take 1 tablet (500 mg total) by mouth daily. 05/25/20   Love, Evlyn Kanner, PA-C  aspirin EC 81 MG tablet Take 1 tablet (81 mg total) by mouth daily. 08/13/18   Stegmayer, Ranae Plumber, PA-C  atorvastatin (LIPITOR) 40 MG tablet Take 1 tablet by mouth once daily 07/07/23   Alba Cory, MD  Cholecalciferol (DIALYVITE VITAMIN D 5000) 125 MCG (5000 UT) capsule Take 5,000 Units by mouth daily.    [provider]  clopidogrel (PLAVIX) 75 MG tablet Take 1 tablet (75 mg total) by mouth daily. 08/18/23   Alba Cory, MD  Fluticasone-Umeclidin-Vilant (TRELEGY ELLIPTA) 100-62.5-25 MCG/ACT AEPB  Inhale 1 puff into the lungs daily. 04/11/23   Alba Cory, MD  gabapentin (NEURONTIN) 100 MG capsule Take 1 capsule (100 mg total) by mouth 3 (three) times daily. 01/07/23   Alba Cory, MD  hydrALAZINE (APRESOLINE) 10 MG tablet Take 1 tablet (10 mg total) by mouth 3 (three) times daily. 07/15/23   Alba Cory, MD  methocarbamol (ROBAXIN) 500 MG tablet Take 1 tablet (500 mg total) by mouth 2 (two) times daily. 08/22/23   Darrick Grinder, PA-C  Multiple Vitamin (MULTIVITAMIN WITH MINERALS) TABS tablet Take 1 tablet by mouth daily. 05/25/20   Love, Evlyn Kanner, PA-C  tadalafil (CIALIS) 20 MG tablet Take 0.5-1 tablets (10-20 mg total) by mouth every other day as needed for erectile dysfunction. 01/07/23   Alba Cory, MD  traZODone (DESYREL) 100 MG tablet Take 1 tablet (100 mg total) by mouth at bedtime. 04/11/23   Alba Cory, MD  triamcinolone cream (KENALOG) 0.1 % Apply 1 Application topically 2 (two) times daily. 07/15/23   Alba Cory, MD  valsartan (DIOVAN) 320 MG tablet Take 1 tablet (320 mg total) by mouth daily. BP and kidney disease 09/02/23   Alba Cory, MD      Allergies    Patient has no known allergies.    Review of Systems   Review of Systems  Respiratory:  Positive for shortness of breath.   All other systems reviewed and are negative.   Physical Exam Updated Vital Signs BP (!) 193/123 (BP Location: Right Arm)  Pulse (!) 114   Temp 98.4 F (36.9 C) (Oral)   Resp (!) 22   SpO2 97%  Physical Exam Vitals and nursing note reviewed.   68 year old male, resting comfortably and in no acute distress. Vital signs are significant for elevated blood pressure, heart rate, respiratory rate. Oxygen saturation is 100%, which is normal. Head is normocephalic and atraumatic. PERRLA, EOMI. Oropharynx is clear. Neck is nontender and supple without adenopathy or JVD. Back is nontender and there is no CVA tenderness. Lungs have a prolonged exhalation phase with  wheezing noted on forced exhalation.  There are no rales or rhonchi. Chest is nontender. Heart is tachycardic without murmur. Abdomen is soft, flat, nontender. Extremities have no cyanosis or edema.  Status post right below the knee amputation. Skin is warm and dry without rash. Neurologic: Mental status is normal, cranial nerves are intact, moves all extremities equally.  ED Results / Procedures / Treatments   Labs (all labs ordered are listed, but only abnormal results are displayed) Labs Reviewed  RESP PANEL BY RT-PCR (RSV, FLU A&B, COVID)  [id number]  BASIC METABOLIC PANEL  BRAIN NATRIURETIC PEPTIDE  CBC WITH DIFFERENTIAL/PLATELET  TROPONIN I (HIGH SENSITIVITY)    EKG None  Radiology No results found.  Procedures Procedures  Cardiac monitor shows sinus tachycardia, per my interpretation.  Medications Ordered in ED Medications  ipratropium-albuterol (DUONEB) 0.5-2.5 (3) MG/3ML nebulizer solution 3 mL (has no administration in time range)    ED Course/ Medical Decision Making/ A&P   {   Click here for ABCD2, HEART and other calculatorsREFRESH Note before signing :1}                              Medical Decision Making Amount and/or Complexity of Data Reviewed Labs: ordered. Radiology: ordered.  Risk Prescription drug management.   Shortness of breath with wheezing which appears to be a COPD exacerbation.  Consider underlying infection such as pneumonia, RSV, COVID-19, influenza.  Also consider angina equivalent, heart failure.  This is a differential which entails significant risk of morbidity and complications.  I have ordered workup of chest x-ray, ECG, PCR test for respiratory pathogens, CBC, basic metabolic panel, troponin x 2, BNP.  I have ordered repeat nebulizer treatment and a dose of methylprednisolone.  I have reviewed his past records and note ED visit on 05/01/2021 for respiratory tract infection and shortness of breath requiring nebulizer  treatments.  {Document critical care time when appropriate:1} {Document review of labs and clinical decision tools ie heart score, Chads2Vasc2 etc:1}  {Document your independent review of radiology images, and any outside records:1} {Document your discussion with family members, caretakers, and with consultants:1} {Document social determinants of health affecting pt's care:1} {Document your decision making why or why not admission, treatments were needed:1} Final Clinical Impression(s) / ED Diagnoses Final diagnoses:  None    Rx / DC Orders ED Discharge Orders     None

## 2023-09-09 NOTE — ED Triage Notes (Signed)
 BIB EMS for shortness of breath after receiving the shingles vaccine at approximately noon today. Received 5mg  of albuterol with EMS, Hx of smoking and COPD.

## 2023-09-10 LAB — RESP PANEL BY RT-PCR (RSV, FLU A&B, COVID)  RVPGX2
Influenza A by PCR: NEGATIVE
Influenza B by PCR: NEGATIVE
Resp Syncytial Virus by PCR: NEGATIVE
SARS Coronavirus 2 by RT PCR: NEGATIVE

## 2023-09-10 LAB — BASIC METABOLIC PANEL
Anion gap: 7 (ref 5–15)
BUN: 23 mg/dL (ref 8–23)
CO2: 24 mmol/L (ref 22–32)
Calcium: 8.9 mg/dL (ref 8.9–10.3)
Chloride: 106 mmol/L (ref 98–111)
Creatinine, Ser: 1.69 mg/dL — ABNORMAL HIGH (ref 0.61–1.24)
GFR, Estimated: 44 mL/min — ABNORMAL LOW (ref >60)
Glucose, Bld: 114 mg/dL — ABNORMAL HIGH (ref 70–99)
Potassium: 3.8 mmol/L (ref 3.5–5.1)
Sodium: 137 mmol/L (ref 135–145)

## 2023-09-10 LAB — TROPONIN I (HIGH SENSITIVITY)
Troponin I (High Sensitivity): 4 ng/L (ref <18)
Troponin I (High Sensitivity): 5 ng/L (ref <18)

## 2023-09-10 LAB — BRAIN NATRIURETIC PEPTIDE: B Natriuretic Peptide: 49.2 pg/mL (ref 0.0–100.0)

## 2023-09-10 MED ORDER — PREDNISONE 50 MG PO TABS: 50.0000 mg | ORAL_TABLET | Freq: Every day | ORAL | 0 refills | Status: DC

## 2023-09-10 NOTE — Discharge Instructions (Addendum)
 Please use your albuterol inhaler every 4-6 hours as needed.  Return to the emergency department if symptoms or not being adequately controlled at home.

## 2023-09-11 ENCOUNTER — Telehealth: Payer: Self-pay

## 2023-09-11 NOTE — Transitions of Care (Post Inpatient/ED Visit) (Signed)
 09/11/2023  Name: Ryan [name] MRN: [id number] DOB: 12/21/1955  Today's TOC FU Call Status: Today's TOC FU Call Status:: Successful TOC FU Call Completed TOC FU Call Complete Date: 09/11/23 Patient's Name and Date of Birth confirmed.  Transition Care Management Follow-up Telephone Call Date of Discharge: 09/10/23 Discharge Facility: [facility]) Type of Discharge: Emergency Department Reason for ED Visit: Other: (hyperglycemia) How have you been since you were released from the hospital?: Better Any questions or concerns?: No  Items Reviewed: Did you receive and understand the discharge instructions provided?: Yes Medications obtained,verified, and reconciled?: Yes (Medications Reviewed) Any new allergies since your discharge?: No Dietary orders reviewed?: Yes Do you have support at home?: Yes People in Home: child(ren), adult  Medications Reviewed Today: Medications Reviewed Today     Reviewed by Karena Addison, LPN (Licensed Practical Nurse) on 09/11/23 at 1051  Med List Status: <None>   Medication Order Taking? Sig Documenting Provider Last Dose Status Informant  albuterol (VENTOLIN HFA) 108 (90 Base) MCG/ACT inhaler [id number] No Inhale 2 puffs into the lungs every 6 (six) hours as needed for wheezing or shortness of breath. Alba Cory, MD Taking Active   ascorbic acid (VITAMIN C) 500 MG tablet [id number] No Take 1 tablet (500 mg total) by mouth daily. Jacquelynn Cree, PA-C Taking Active Self  aspirin EC 81 MG tablet [id number] No Take 1 tablet (81 mg total) by mouth daily. Stegmayer, Ranae Plumber, PA-C Taking Active Self  atorvastatin (LIPITOR) 40 MG tablet [id number] No Take 1 tablet by mouth once daily Alba Cory, MD Taking Active   Cholecalciferol (DIALYVITE VITAMIN D 5000) 125 MCG (5000 UT) capsule [id number] No Take 5,000 Units by mouth daily. [provider] Taking Active Self  clopidogrel (PLAVIX) 75 MG tablet [id number] No Take 1 tablet (75 mg  total) by mouth daily. Alba Cory, MD Taking Active   Fluticasone-Umeclidin-Vilant [facility] ELLIPTA) 100-62.5-25 MCG/ACT AEPB [id number] No Inhale 1 puff into the lungs daily. Alba Cory, MD Taking Active   gabapentin (NEURONTIN) 100 MG capsule [id number] No Take 1 capsule (100 mg total) by mouth 3 (three) times daily. Alba Cory, MD Taking Active   hydrALAZINE (APRESOLINE) 10 MG tablet [id number] No Take 1 tablet (10 mg total) by mouth 3 (three) times daily. Alba Cory, MD Taking Active   methocarbamol (ROBAXIN) 500 MG tablet [id number] No Take 1 tablet (500 mg total) by mouth 2 (two) times daily. Darrick Grinder, PA-C Taking Active   Multiple Vitamin (MULTIVITAMIN WITH MINERALS) TABS tablet [id number] No Take 1 tablet by mouth daily. Jacquelynn Cree, PA-C Taking Active Self  predniSONE (DELTASONE) 50 MG tablet [id number]  Take 1 tablet (50 mg total) by mouth daily. Dione Booze, MD  Active   tadalafil (CIALIS) 20 MG tablet [id number] No Take 0.5-1 tablets (10-20 mg total) by mouth every other day as needed for erectile dysfunction. Alba Cory, MD Taking Active   traZODone (DESYREL) 100 MG tablet [id number] No Take 1 tablet (100 mg total) by mouth at bedtime. Alba Cory, MD Taking Active   triamcinolone cream (KENALOG) 0.1 % [id number] No Apply 1 Application topically 2 (two) times daily. Alba Cory, MD Taking Active   valsartan (DIOVAN) 320 MG tablet [id number]  Take 1 tablet (320 mg total) by mouth daily. BP and kidney disease Alba Cory, MD  Active             Home Care and Equipment/Supplies: Were Home Health Services Ordered?: NA Any new equipment or medical supplies ordered?:  NA  Functional Questionnaire: Do you need assistance with bathing/showering or dressing?: No Do you need assistance with meal preparation?: No Do you need assistance with eating?: No Do you have difficulty maintaining continence: No Do you need assistance with getting out of  bed/getting out of a chair/moving?: No Do you have difficulty managing or taking your medications?: No  Follow up appointments reviewed: PCP Follow-up appointment confirmed?: Yes Date of PCP follow-up appointment?: 09/15/23 Follow-up Provider: [facility] Follow-up appointment confirmed?: NA Do you need transportation to your follow-up appointment?: No Do you understand care options if your condition(s) worsen?: Yes-patient verbalized understanding    SIGNATURE Karena Addison, LPN [facility] Nurse Health Advisor Direct Dial [phone]

## 2023-09-15 ENCOUNTER — Ambulatory Visit (INDEPENDENT_AMBULATORY_CARE_PROVIDER_SITE_OTHER): Admitting: Family Medicine

## 2023-09-15 ENCOUNTER — Encounter: Payer: Self-pay | Admitting: Family Medicine

## 2023-09-15 VITALS — BP 130/72 | HR 61 | Resp 16 | Ht 68.0 in | Wt 138.7 lb

## 2023-09-15 DIAGNOSIS — J441 Chronic obstructive pulmonary disease with (acute) exacerbation: Secondary | ICD-10-CM | POA: Diagnosis not present

## 2023-09-15 NOTE — Progress Notes (Signed)
 Name: Ryan [name]   MRN: [id number]    DOB: September 06, 1955   Date:09/15/2023       Progress Note  Subjective  Chief Complaint  Chief Complaint  Patient presents with   Hospitalization Follow-up    Pt states feeling much better   HPI   COPD exacerbation: he had a shingrix vaccine at [facility] on 09/09/2023 and about 8 of hours later he developed chills, some SOB, but no increase in cough. They called 911, he was given a dose of nebulizer and felt better, at [facility] CXR showed chronic changes He was sent home with prednisone, but he only took one dose. He is feeling better since. He is still using Trelegy, he is still smoking but is down to 4 cigarettes daily  Medication reconciliation was done today   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   History of colonic polyps 08/08/2023   Polyp of ascending colon 08/08/2023   Cocaine use disorder, mild, in early remission (HCC) 08/27/2022   History of alcoholism (HCC) 01/24/2022   History of amputation below knee, right (HCC) 01/24/2022   Moderate protein malnutrition (HCC) 01/24/2022   Benign hypertension with chronic kidney disease, stage III (HCC) 01/24/2022   Aneurysm of ascending aorta without rupture (HCC) 01/24/2022   Stage 3a chronic kidney disease (HCC) 01/24/2022   Phantom pain after amputation of lower extremity (HCC)    Below-knee amputation of right lower extremity (HCC) 05/17/2020   PAD (peripheral artery disease) (HCC) 07/21/2018   B12 deficiency 03/12/2018   Vitamin B1 deficiency 03/12/2018   Varicose veins of leg with pain, right 03/12/2018   Enlarged thoracic aorta (HCC) 02/24/2018   Paresthesia 02/24/2018   Emphysema lung (HCC) 02/20/2018   Atherosclerosis of aorta (HCC) 02/20/2018   Hypertension, benign 08/31/2015   Tobacco use 08/31/2015    Past Surgical History:  Procedure Laterality Date   ABDOMINAL AORTOGRAM W/LOWER EXTREMITY N/A 09/13/2019   Procedure:  ABDOMINAL AORTOGRAM W/LOWER EXTREMITY;  Surgeon: Maeola Harman, MD;  Location: [facility] INVASIVE CV LAB;  Service: Cardiovascular;  Laterality: N/A;   AMPUTATION Right 05/09/2020   Procedure: RIGHT BELOW KNEE AMPUTATION;  Surgeon: Maeola Harman, MD;  Location: [facility] OR;  Service: Vascular;  Laterality: Right;  w/ a block   COLONOSCOPY WITH PROPOFOL N/A 02/15/2016   Procedure: COLONOSCOPY WITH PROPOFOL;  Surgeon: [name], MD;  Location: [facility] SURGERY CNTR;  Service: Endoscopy;  Laterality: N/A;   COLONOSCOPY WITH PROPOFOL N/A 08/08/2023   Procedure: COLONOSCOPY WITH PROPOFOL;  Surgeon: [name], MD;  Location: [facility] ENDOSCOPY;  Service: Endoscopy;  Laterality: N/A;   FEMORAL-POPLITEAL BYPASS GRAFT Right 09/14/2019   Procedure: BYPASS GRAFT FEMORAL-POPLITEAL ARTERY;  Surgeon: Maeola Harman, MD;  Location: [facility] OR;  Service: Vascular;  Laterality: Right;   HERNIA REPAIR  1999   left inguinal   INSERTION OF ILIAC STENT Right 09/14/2019   Procedure: Insertion Of Common and External Iliac Stent;  Surgeon: Maeola Harman, MD;  Location: [facility] OR;  Service: Vascular;  Laterality: Right;   LOWER EXTREMITY ANGIOGRAPHY Right 05/07/2018   Procedure: LOWER EXTREMITY ANGIOGRAPHY;  Surgeon: Annice Needy, MD;  Location: [facility] INVASIVE CV LAB;  Service: Cardiovascular;  Laterality: Right;   LOWER EXTREMITY ANGIOGRAPHY Right 06/25/2018   Procedure: LOWER EXTREMITY ANGIOGRAPHY;  Surgeon: Annice Needy, MD;  Location: [facility] INVASIVE CV LAB;  Service: Cardiovascular;  Laterality: Right;   LOWER EXTREMITY ANGIOGRAPHY Right 07/01/2018   Procedure:  LOWER EXTREMITY ANGIOGRAPHY;  Surgeon: Annice Needy, MD;  Location: [facility] INVASIVE CV LAB;  Service: Cardiovascular;  Laterality: Right;   LOWER EXTREMITY ANGIOGRAPHY Right 08/12/2018   Procedure: LOWER EXTREMITY ANGIOGRAPHY;  Surgeon: Annice Needy, MD;  Location: [facility] INVASIVE CV LAB;  Service: Cardiovascular;  Laterality: Right;   LOWER  EXTREMITY ANGIOGRAPHY Right 09/03/2018   Procedure: LOWER EXTREMITY ANGIOGRAPHY;  Surgeon: Annice Needy, MD;  Location: [facility] INVASIVE CV LAB;  Service: Cardiovascular;  Laterality: Right;   LOWER EXTREMITY ANGIOGRAPHY Right 09/04/2018   Procedure: Lower Extremity Angiography;  Surgeon: Annice Needy, MD;  Location: [facility] INVASIVE CV LAB;  Service: Cardiovascular;  Laterality: Right;   LOWER EXTREMITY ANGIOGRAPHY Right 10/01/2018   Procedure: LOWER EXTREMITY ANGIOGRAPHY;  Surgeon: Annice Needy, MD;  Location: [facility] INVASIVE CV LAB;  Service: Cardiovascular;  Laterality: Right;   LOWER EXTREMITY ANGIOGRAPHY Right 10/01/2018   Procedure: Lower Extremity Angiography;  Surgeon: Annice Needy, MD;  Location: [facility] INVASIVE CV LAB;  Service: Cardiovascular;  Laterality: Right;   LOWER EXTREMITY ANGIOGRAPHY Right 10/15/2018   Procedure: LOWER EXTREMITY ANGIOGRAPHY;  Surgeon: Annice Needy, MD;  Location: [facility] INVASIVE CV LAB;  Service: Cardiovascular;  Laterality: Right;   LOWER EXTREMITY ANGIOGRAPHY Left 10/16/2018   Procedure: Lower Extremity Angiography;  Surgeon: Annice Needy, MD;  Location: [facility] INVASIVE CV LAB;  Service: Cardiovascular;  Laterality: Left;   LOWER EXTREMITY ANGIOGRAPHY Left 01/20/2019   Procedure: LOWER EXTREMITY ANGIOGRAPHY;  Surgeon: Annice Needy, MD;  Location: [facility] INVASIVE CV LAB;  Service: Cardiovascular;  Laterality: Left;   LOWER EXTREMITY ANGIOGRAPHY Right 01/21/2019   Procedure: Lower Extremity Angiography;  Surgeon: Annice Needy, MD;  Location: [facility] INVASIVE CV LAB;  Service: Cardiovascular;  Laterality: Right;   LOWER EXTREMITY ANGIOGRAPHY Right 01/28/2019   Procedure: LOWER EXTREMITY ANGIOGRAPHY;  Surgeon: Annice Needy, MD;  Location: [facility] INVASIVE CV LAB;  Service: Cardiovascular;  Laterality: Right;   LOWER EXTREMITY ANGIOGRAPHY Right 01/29/2019   Procedure: Lower Extremity Angiography;  Surgeon: Annice Needy, MD;  Location: [facility] INVASIVE CV LAB;  Service: Cardiovascular;  Laterality:  Right;   LOWER EXTREMITY ANGIOGRAPHY Right 04/04/2020   Procedure: LOWER EXTREMITY ANGIOGRAPHY;  Surgeon: Maeola Harman, MD;  Location: [facility] INVASIVE CV LAB;  Service: Cardiovascular;  Laterality: Right;   LOWER EXTREMITY INTERVENTION Right 07/02/2018   Procedure: LOWER EXTREMITY INTERVENTION;  Surgeon: Annice Needy, MD;  Location: [facility] INVASIVE CV LAB;  Service: Cardiovascular;  Laterality: Right;   PERIPHERAL VASCULAR BALLOON ANGIOPLASTY Left 04/04/2020   Procedure: PERIPHERAL VASCULAR BALLOON ANGIOPLASTY;  Surgeon: Maeola Harman, MD;  Location: [facility] INVASIVE CV LAB;  Service: Cardiovascular;  Laterality: Left;  external iliac   POLYPECTOMY N/A 02/15/2016   Procedure: POLYPECTOMY;  Surgeon: [name], MD;  Location: [facility] SURGERY CNTR;  Service: Endoscopy;  Laterality: N/A;   POLYPECTOMY  08/08/2023   Procedure: POLYPECTOMY INTESTINAL;  Surgeon: [name], MD;  Location: [facility] ENDOSCOPY;  Service: Endoscopy;;    Family History  Problem Relation Age of Onset   COPD Mother    Hypertension Father    Heart attack Brother     Social History   Tobacco Use   Smoking status: Every Day    Current packs/day: 0.00    Average packs/day: 0.5 packs/day for 40.4 years (20.2 ttl pk-yrs)    Types: Cigarettes    Start date: 07/21/1978    Last attempt to quit: 12/2018    Years since quitting: 4.7    Passive  exposure: Never   Smokeless tobacco: Never   Tobacco comments:    Started using cocaine recently but states he will try to quit   Substance Use Topics   Alcohol use: Not Currently    Alcohol/week: 12.0 standard drinks of alcohol    Types: 12 Cans of beer per week    Comment: 4 beers / week now per pt 05/05/20     Current Outpatient Medications:    albuterol (VENTOLIN HFA) 108 (90 Base) MCG/ACT inhaler, Inhale 2 puffs into the lungs every 6 (six) hours as needed for wheezing or shortness of breath., Disp: 18 g, Rfl: 0   ascorbic acid (VITAMIN C) 500 MG tablet, Take 1  tablet (500 mg total) by mouth daily., Disp: 30 tablet, Rfl: 0   aspirin EC 81 MG tablet, Take 1 tablet (81 mg total) by mouth daily., Disp: 90 tablet, Rfl: 3   atorvastatin (LIPITOR) 40 MG tablet, Take 1 tablet by mouth once daily, Disp: 90 tablet, Rfl: 0   Cholecalciferol (DIALYVITE VITAMIN D 5000) 125 MCG (5000 UT) capsule, Take 5,000 Units by mouth daily., Disp: , Rfl:    clopidogrel (PLAVIX) 75 MG tablet, Take 1 tablet (75 mg total) by mouth daily., Disp: 90 tablet, Rfl: 1   Fluticasone-Umeclidin-Vilant (TRELEGY ELLIPTA) 100-62.5-25 MCG/ACT AEPB, Inhale 1 puff into the lungs daily., Disp: 180 each, Rfl: 1   gabapentin (NEURONTIN) 100 MG capsule, Take 1 capsule (100 mg total) by mouth 3 (three) times daily., Disp: 270 capsule, Rfl: 1   hydrALAZINE (APRESOLINE) 10 MG tablet, Take 1 tablet (10 mg total) by mouth 3 (three) times daily., Disp: 90 tablet, Rfl: 0   methocarbamol (ROBAXIN) 500 MG tablet, Take 1 tablet (500 mg total) by mouth 2 (two) times daily., Disp: 20 tablet, Rfl: 0   Multiple Vitamin (MULTIVITAMIN WITH MINERALS) TABS tablet, Take 1 tablet by mouth daily., Disp: , Rfl:    predniSONE (DELTASONE) 50 MG tablet, Take 1 tablet (50 mg total) by mouth daily., Disp: 5 tablet, Rfl: 0   tadalafil (CIALIS) 20 MG tablet, Take 0.5-1 tablets (10-20 mg total) by mouth every other day as needed for erectile dysfunction., Disp: 10 tablet, Rfl: 2   traZODone (DESYREL) 100 MG tablet, Take 1 tablet (100 mg total) by mouth at bedtime., Disp: 90 tablet, Rfl: 1   triamcinolone cream (KENALOG) 0.1 %, Apply 1 Application topically 2 (two) times daily., Disp: 454 g, Rfl: 0   valsartan (DIOVAN) 320 MG tablet, Take 1 tablet (320 mg total) by mouth daily. BP and kidney disease, Disp: 90 tablet, Rfl: 0  No Known Allergies  I personally reviewed active problem list, medication list, allergies with the patient/caregiver today.   ROS  Ten systems reviewed and is negative except as mentioned in HPI     Objective  Vitals:   09/15/23 1402  BP: 130/72  Pulse: 61  Resp: 16  SpO2: 98%  Weight: 138 lb 11.2 oz (62.9 kg)  Height: 5\' 8"  (1.727 m)    Body mass index is 21.09 kg/m.  Physical Exam  Constitutional: Patient appears well-developed and well-nourished.  No distress.  HEENT: head atraumatic, normocephalic, pupils equal and reactive to light, neck supple Cardiovascular: Normal rate, regular rhythm and normal heart sounds.  No murmur heard. No BLE edema. Pulmonary/Chest: Effort normal and breath sounds normal. No respiratory distress. Abdominal: Soft.  There is no tenderness. Psychiatric: Patient has a normal mood and affect. behavior is normal. Judgment and thought content normal.   Recent Results (  from the past 2160 hours)  I-STAT creatinine     Status: Abnormal   Collection Time: 07/30/23 10:59 AM  Result Value Ref Range   Creatinine, Ser 1.60 (H) 0.61 - 1.24 mg/dL  Surgical pathology     Status: None   Collection Time: 08/08/23 12:00 AM  Result Value Ref Range   SURGICAL PATHOLOGY      SURGICAL PATHOLOGY [facility] [address], [address] [place], [place] [address] Telephone [phone] or [phone] Fax [phone]  REPORT OF SURGICAL PATHOLOGY   Accession #: [id number] Patient Name: DAMION, KANT Visit # : [id number]  MRN: [id number] Physician: [name] DOB/Age Feb 13, 1956 (Age: 86) Gender: M Collected Date: 08/08/2023 Received Date: 08/08/2023  FINAL DIAGNOSIS       1. Cecum Polyp, cbx :       BENIGN COLONIC MUCOSA.      NEGATIVE FOR DYSPLASIA OR MALIGNANCY.       2. 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       3. Sigmoid  Colon Polyp, cold snare :       HYPERPLASTIC POLYP (1).       ELECTRONIC SIGNATURE : Jacelyn Grip, John, Pathologist, Electronic Signature  MICROSCOPIC DESCRIPTION  CASE COMMENTS STAINS USED IN DIAGNOSIS: H&E H&E H&E    CLINICAL  HISTORY  SPECIMEN(S) OBTAINED 1. Cecum Polyp, Cbx 2. Descending Colon Polyp, Cbx 3. Sigmoi d  Colon Polyp, Cold Snare  SPECIMEN COMMENTS: SPECIMEN CLINICAL INFORMATION: 1. History of colon polyps    Gross Description 1. "CBX cecal polyp", received in formalin is a single 0.5 x 0.4 x 0.1 cm gray-pink tissue fragment.The specimen is submitted in toto in 1 block (1A). 2. "CBX descending colon polyp", received in formalin is a 1.0 x 0.2 x 0.1 cm aggregate of 4 pale tan tissue fragments.The specimen is submitted in toto in 1 block (2A). 3. "Cold snare sigmoid colon polyp", received in formalin is a single 1.1 x 0.9 x 0.1 cm tan-pink tissue fragment.The specimen is trisected and submitted entirely in 1 block (3A).      [facility] 08/08/2023        Report signed out from the following location(s) [facility] [address], [place] [address] CLIA #: [id number]  [facility] [address] [place], [place] [address] CLIA #: [id number]   Urinalysis, Routine w reflex microscopic -Urine, Clean Catch     Status: Abnormal   Collection Time: 08/21/23 12:49 PM  Result Value Ref Range   Color, Urine YELLOW YELLOW   APPearance CLEAR CLEAR   Specific Gravity, Urine 1.012 1.005 - 1.030   pH 6.0 5.0 - 8.0   Glucose, UA NEGATIVE NEGATIVE mg/dL   Hgb urine dipstick NEGATIVE NEGATIVE   Bilirubin Urine NEGATIVE NEGATIVE   Ketones, ur NEGATIVE NEGATIVE mg/dL   Protein, ur 30 (A) NEGATIVE mg/dL   Nitrite NEGATIVE NEGATIVE   Leukocytes,Ua NEGATIVE NEGATIVE   RBC / HPF 0-5 0 - 5 RBC/hpf   WBC, UA 0-5 0 - 5 WBC/hpf   Bacteria, UA RARE (A) NONE SEEN   Squamous Epithelial / HPF 0-5 0 - 5 /HPF   Mucus PRESENT     Comment: Performed at [facility], [address], [place], [place] [address]  Comprehensive metabolic panel     Status: Abnormal   Collection Time: 08/21/23  1:20 PM  Result Value Ref  Range   Sodium 140 135 - 145 mmol/L   Potassium 4.2  3.5 - 5.1 mmol/L   Chloride 107 98 - 111 mmol/L   CO2 22 22 - 32 mmol/L   Glucose, Bld 93 70 - 99 mg/dL    Comment: Glucose reference range applies only to samples taken after fasting for at least 8 hours.   BUN 19 8 - 23 mg/dL   Creatinine, Ser 7.82 (H) 0.61 - 1.24 mg/dL   Calcium 9.1 8.9 - 95.6 mg/dL   Total Protein 7.2 6.5 - 8.1 g/dL   Albumin 3.9 3.5 - 5.0 g/dL   AST 19 15 - 41 U/L   ALT 16 0 - 44 U/L   Alkaline Phosphatase 81 38 - 126 U/L   Total Bilirubin 0.7 0.0 - 1.2 mg/dL   GFR, Estimated 50 (L) >60 mL/min    Comment: (NOTE) Calculated using the CKD-EPI Creatinine Equation (2021)    Anion gap 11 5 - 15    Comment: Performed at [facility], [address], [place], [place] [address]  CBC     Status: Abnormal   Collection Time: 08/21/23  1:20 PM  Result Value Ref Range   WBC 6.1 4.0 - 10.5 K/uL   RBC 4.07 (L) 4.22 - 5.81 MIL/uL   Hemoglobin 13.0 13.0 - 17.0 g/dL   HCT 65.7 (L) 84.6 - 96.2 %   MCV 94.1 80.0 - 100.0 fL   MCH 31.9 26.0 - 34.0 pg   MCHC 33.9 30.0 - 36.0 g/dL   RDW 95.2 84.1 - 32.4 %   Platelets 216 150 - 400 K/uL   nRBC 0.0 0.0 - 0.2 %    Comment: Performed at [facility], [address], [place], [place] [address]  Lipase, blood     Status: None   Collection Time: 08/21/23  1:20 PM  Result Value Ref Range   Lipase 30 11 - 51 U/L    Comment: Performed at [facility], [address] [place], [place] [address]  Urine cytology ancillary only     Status: None   Collection Time: 08/28/23 10:28 AM  Result Value Ref Range   Neisseria Gonorrhea Negative    Chlamydia Negative    Trichomonas Negative    Comment Normal Reference Range Trichomonas - Negative    Comment Normal Reference Ranger Chlamydia - Negative    Comment      Normal Reference Range Neisseria Gonorrhea - Negative  Urine Culture     Status: None   Collection Time: 08/28/23 11:08 AM   Specimen: Urine  Result Value Ref Range   MICRO NUMBER: [id number]    SPECIMEN  QUALITY: Adequate    Sample Source URINE    STATUS: FINAL    Result: No Growth   Urinalysis, Routine w reflex microscopic     Status: Abnormal   Collection Time: 08/28/23 11:08 AM  Result Value Ref Range   Color, Urine YELLOW YELLOW   APPearance CLEAR CLEAR   Specific Gravity, Urine 1.013 1.001 - 1.035   pH 5.5 5.0 - 8.0   Glucose, UA NEGATIVE NEGATIVE   Bilirubin Urine NEGATIVE NEGATIVE   Ketones, ur NEGATIVE NEGATIVE   Hgb urine dipstick NEGATIVE NEGATIVE   Protein, ur 1+ (A) NEGATIVE   Nitrite NEGATIVE NEGATIVE   Leukocytes,Ua NEGATIVE NEGATIVE   WBC, UA NONE SEEN 0 - 5 /HPF   RBC / HPF NONE SEEN 0 - 2 /HPF   Squamous Epithelial /  HPF NONE SEEN < OR = 5 /HPF   Bacteria, UA NONE SEEN NONE SEEN /HPF   Hyaline Cast NONE SEEN NONE SEEN /LPF  MICROSCOPIC MESSAGE     Status: None   Collection Time: 08/28/23 11:08 AM  Result Value Ref Range   Note      Comment: This urine was analyzed for the presence of WBC,  RBC, bacteria, casts, and other formed elements.  Only those elements seen were reported. . .   Brain natriuretic peptide     Status: None   Collection Time: 09/09/23 11:18 PM  Result Value Ref Range   B Natriuretic Peptide 49.2 0.0 - 100.0 pg/mL    Comment: Performed at [facility], [address] [place], [place] [address]  Basic metabolic panel     Status: Abnormal   Collection Time: 09/09/23 11:21 PM  Result Value Ref Range   Sodium 137 135 - 145 mmol/L   Potassium 3.8 3.5 - 5.1 mmol/L   Chloride 106 98 - 111 mmol/L   CO2 24 22 - 32 mmol/L   Glucose, Bld 114 (H) 70 - 99 mg/dL    Comment: Glucose reference range applies only to samples taken after fasting for at least 8 hours.   BUN 23 8 - 23 mg/dL   Creatinine, Ser 1.91 (H) 0.61 - 1.24 mg/dL   Calcium 8.9 8.9 - 47.8 mg/dL   GFR, Estimated 44 (L) >60 mL/min    Comment: (NOTE) Calculated using the CKD-EPI Creatinine Equation (2021)    Anion gap 7 5 - 15    Comment: Performed at [facility]  Lab, [address], [place], [place] [address]  CBC with Differential     Status: Abnormal   Collection Time: 09/09/23 11:21 PM  Result Value Ref Range   WBC 8.4 4.0 - 10.5 K/uL   RBC 4.07 (L) 4.22 - 5.81 MIL/uL   Hemoglobin 13.0 13.0 - 17.0 g/dL   HCT 13.0 (L) 86.5 - 78.4 %   MCV 92.6 80.0 - 100.0 fL   MCH 31.9 26.0 - 34.0 pg   MCHC 34.5 30.0 - 36.0 g/dL   RDW 69.6 29.5 - 28.4 %   Platelets 191 150 - 400 K/uL   nRBC 0.0 0.0 - 0.2 %   Neutrophils Relative % 82 %   Neutro Abs 6.8 1.7 - 7.7 K/uL   Lymphocytes Relative 12 %   Lymphs Abs 1.0 0.7 - 4.0 K/uL   Monocytes Relative 6 %   Monocytes Absolute 0.5 0.1 - 1.0 K/uL   Eosinophils Relative 0 %   Eosinophils Absolute 0.0 0.0 - 0.5 K/uL   Basophils Relative 0 %   Basophils Absolute 0.0 0.0 - 0.1 K/uL   Immature Granulocytes 0 %   Abs Immature Granulocytes 0.02 0.00 - 0.07 K/uL    Comment: Performed at [facility], [address], [place], [place] [address]  Troponin I (High Sensitivity)     Status: None   Collection Time: 09/09/23 11:21 PM  Result Value Ref Range   Troponin I (High Sensitivity) 4 <18 ng/L    Comment: (NOTE) Elevated high sensitivity troponin I (hsTnI) values and significant  changes across serial measurements may suggest ACS but many other  chronic and acute conditions are known to elevate hsTnI results.  Refer to the "Links" section for chest pain algorithms and additional  guidance. Performed at [facility], [address], [place], [place] [address]   Resp panel  by RT-PCR (RSV, Flu A&B, Covid) Anterior Nasal Swab     Status: None   Collection Time: 09/09/23 11:21 PM   Specimen: Anterior Nasal Swab  Result Value Ref Range   SARS Coronavirus 2 by RT PCR NEGATIVE NEGATIVE   Influenza A by PCR NEGATIVE NEGATIVE   Influenza B by PCR NEGATIVE NEGATIVE    Comment: (NOTE) The Xpert Xpress SARS-CoV-2/FLU/RSV plus assay is intended as an aid in the diagnosis of influenza from Nasopharyngeal swab  specimens and should not be used as a sole basis for treatment. Nasal washings and aspirates are unacceptable for Xpert Xpress SARS-CoV-2/FLU/RSV testing.  Fact Sheet for Patients: BloggerCourse.com  Fact Sheet for Healthcare Providers: SeriousBroker.it  This test is not yet approved or cleared by the [place] FDA and has been authorized for detection and/or diagnosis of SARS-CoV-2 by FDA under an Emergency Use Authorization (EUA). This EUA will remain in effect (meaning this test can be used) for the duration of the COVID-19 declaration under Section 564(b)(1) of the Act, 21 U.S.C. section 360bbb-3(b)(1), unless the authorization is terminated or revoked.     Resp Syncytial Virus by PCR NEGATIVE NEGATIVE    Comment: (NOTE) Fact Sheet for Patients: BloggerCourse.com  Fact Sheet for Healthcare Providers: SeriousBroker.it  This test is not yet approved or cleared by the [place] FDA and has been authorized for detection and/or diagnosis of SARS-CoV-2 by FDA under an Emergency Use Authorization (EUA). This EUA will remain in effect (meaning this test can be used) for the duration of the COVID-19 declaration under Section 564(b)(1) of the Act, 21 U.S.C. section 360bbb-3(b)(1), unless the authorization is terminated or revoked.  Performed at [facility], [address], [place], [place] [address]   Troponin I (High Sensitivity)     Status: None   Collection Time: 09/10/23  1:14 AM  Result Value Ref Range   Troponin I (High Sensitivity) 5 <18 ng/L    Comment: (NOTE) Elevated high sensitivity troponin I (hsTnI) values and significant  changes across serial measurements may suggest ACS but many other  chronic and acute conditions are known to elevate hsTnI results.  Refer to the "Links" section for chest pain algorithms and additional  guidance. Performed at  [facility], [address], [place], [place] [address]     Diabetic Foot Exam:     PHQ2/9:    09/15/2023    2:00 PM 07/15/2023    9:04 AM 04/11/2023   11:20 AM 01/07/2023    7:40 AM 11/05/2022   11:06 AM  Depression screen PHQ 2/9  Decreased Interest 0 0 0 0 0  Down, Depressed, Hopeless 0 0 0 0 0  PHQ - 2 Score 0 0 0 0 0  Altered sleeping 0 0 0 0 0  Tired, decreased energy 0 0 0 0 0  Change in appetite 0 0 0 0 0  Feeling bad or failure about yourself  0 0 0 0 0  Trouble concentrating 0 0 0 0 0  Moving slowly or fidgety/restless 0 0 0 0 0  Suicidal thoughts 0 0 0 0 0  PHQ-9 Score 0 0 0 0 0  Difficult doing work/chores Not difficult at all Not difficult at all       phq 9 is negative  Fall Risk:    07/15/2023    9:04 AM 04/11/2023   11:20 AM 01/07/2023    7:40 AM 11/05/2022   11:06 AM 10/09/2022   10:54 AM  Fall Risk   Falls in the past year? 0 0 0 0 0  Number falls in past yr: 0  0 0 0  Injury with Fall? 0  0 0 0  Risk for fall due to : No Fall Risks No Fall Risks No Fall Risks No Fall Risks No Fall Risks  Follow up Falls prevention discussed;Education provided;Falls evaluation completed Falls prevention discussed Falls prevention discussed Falls prevention discussed Falls prevention discussed     Assessment & Plan  1. COPD with acute exacerbation (HCC) (Primary)  Doing well now, trying to quit smoking

## 2023-09-15 NOTE — Progress Notes (Signed)
 [address]       [place] [address]             [phone]    JUDDSON COBERN [id number] [birth year]  History of Present Illness: Mr. Leavy is a 68 year old male with a past medical history of hypertension, hyperlipidemia, stage IIIa chronic kidney disease, peripheral vascular disease, SMA stenosis, current tobacco abuse, and history of cocaine and alcohol abuse. The patient was incidentally found to have a 4.1 ascending aortic aneurysm on CT scan for lung cancer screening in 2021. CTA in 2023 and 2025 show a 3.8cm ascending aorta and the last CTA on 02/27 noted a normal caliber thoracic aorta without aneurysm. The patient was referred to our office for ascending aortic aneurysm surveillance  The patient denies family history of ATAA and personal history of connective tissue disorder.   The patient admits to a tightness in his left sided chest that quickly went away and has never happened again. He also admits to an episode of shortness of breath a few weeks ago after the shingles shot but this resolved with a couple days of Prednisone. He admits to dry cough due to smoking but denies further SOB, he denies dizziness and LOC. He does admit to smoking about 1 pack per 2-3days but is trying to quit.   Current Outpatient Medications on File Prior to Visit  Medication Sig Dispense Refill   albuterol (VENTOLIN HFA) 108 (90 Base) MCG/ACT inhaler Inhale 2 puffs into the lungs every 6 (six) hours as needed for wheezing or shortness of breath. 18 g 0   ascorbic acid (VITAMIN C) 500 MG tablet Take 1 tablet (500 mg total) by mouth daily. 30 tablet 0   aspirin EC 81 MG tablet Take 1 tablet (81 mg total) by mouth daily. 90 tablet 3   atorvastatin (LIPITOR) 40 MG tablet Take 1 tablet by mouth once daily 90 tablet 0   Cholecalciferol (DIALYVITE VITAMIN D 5000) 125 MCG (5000 UT) capsule Take 5,000 Units by mouth daily.     clopidogrel (PLAVIX) 75 MG tablet Take 1 tablet (75 mg  total) by mouth daily. 90 tablet 1   Fluticasone-Umeclidin-Vilant (TRELEGY ELLIPTA) 100-62.5-25 MCG/ACT AEPB Inhale 1 puff into the lungs daily. 180 each 1   gabapentin (NEURONTIN) 100 MG capsule Take 1 capsule (100 mg total) by mouth 3 (three) times daily. 270 capsule 1   hydrALAZINE (APRESOLINE) 10 MG tablet Take 1 tablet (10 mg total) by mouth 3 (three) times daily. 90 tablet 0   methocarbamol (ROBAXIN) 500 MG tablet Take 1 tablet (500 mg total) by mouth 2 (two) times daily. 20 tablet 0   Multiple Vitamin (MULTIVITAMIN WITH MINERALS) TABS tablet Take 1 tablet by mouth daily.     predniSONE (DELTASONE) 50 MG tablet Take 1 tablet (50 mg total) by mouth daily. 5 tablet 0   tadalafil (CIALIS) 20 MG tablet Take 0.5-1 tablets (10-20 mg total) by mouth every other day as needed for erectile dysfunction. 10 tablet 2   traZODone (DESYREL) 100 MG tablet Take 1 tablet (100 mg total) by mouth at bedtime. 90 tablet 1   triamcinolone cream (KENALOG) 0.1 % Apply 1 Application topically 2 (two) times daily. 454 g 0   valsartan (DIOVAN) 320 MG tablet Take 1 tablet (320 mg total) by mouth daily. BP and kidney disease 90 tablet 0   No current facility-administered medications on file prior to visit.   Vitals: Today's Vitals  09/29/23 1452  BP: (!) 140/80  Pulse: 82  Resp: 18  SpO2: 97%  Weight: 138 lb (62.6 kg)  Height: 5\' 8"  (1.727 m)   Body mass index is 20.98 kg/m. Review of Systems  Constitutional:  Negative for chills, fever, malaise/fatigue and weight loss.  HENT:  Positive for hearing loss.        Bilateral hearing aids  Eyes:        Uses glasses  Respiratory:  Positive for cough and wheezing. Negative for sputum production.   Cardiovascular:  Negative for chest pain, palpitations, orthopnea and leg swelling.       1 episode of chest tightness  Gastrointestinal:  Negative for heartburn, nausea and vomiting.  Musculoskeletal:  Negative for myalgias.  Neurological:  Negative for dizziness,  loss of consciousness, weakness and headaches.  Endo/Heme/Allergies:  Does not bruise/bleed easily.  Psychiatric/Behavioral:  Negative for depression. The patient is not nervous/anxious.      Physical Exam General:alert and oriented, no acute distress Neuro: Grossly intact CV: Regular rate and rhythm, no murmur Pulm: Clear to auscultation bilaterally GI: Nontender, no distension Extremities: No edema LLE, prosthetic RLE, 2+ bilateral radial pulses  CTA Results: CLINICAL DATA:  Abdominal/flank pain, acute aortic syndrome suspected   EXAM: CT ANGIOGRAPHY CHEST, ABDOMEN AND PELVIS   TECHNIQUE: Non-contrast CT of the chest was initially obtained.   Multidetector CT imaging through the chest, abdomen and pelvis was performed using the standard protocol during bolus administration of intravenous contrast. Multiplanar reconstructed images and MIPs were obtained and reviewed to evaluate the vascular anatomy.   RADIATION DOSE REDUCTION: This exam was performed according to the departmental dose-optimization program which includes automated exposure control, adjustment of the mA and/or kV according to patient size and/or use of iterative reconstruction technique.   CONTRAST:  OMNIPAQUE IOHEXOL 350 MG/ML SOLN   COMPARISON:  Same day CT of the abdomen and pelvis and CT chest 07/30/2023   FINDINGS: CTA CHEST FINDINGS   Cardiovascular: Cardiac motion degrades evaluation of the aortic root. No evidence of aortic dissection, intramural hematoma, or penetrating atherosclerotic ulcer. Normal caliber thoracic aorta. Mild aortic atherosclerotic plaque. No pericardial effusion.   Mediastinum/Nodes: Trachea and esophagus are unremarkable. No thoracic adenopathy.   Lungs/Pleura: Advanced emphysema with bullous change in the left upper and lower lobes. Scarring, volume loss, and architectural distortion in the left lower lobe. Partially calcified left pleural effusion/thickening,  unchanged. Mild diffuse bronchial wall thickening. No pneumothorax.   Musculoskeletal: No acute fracture.   Review of the MIP images confirms the above findings.   CTA ABDOMEN AND PELVIS FINDINGS   VASCULAR   Aorta: No aneurysm or dissection. No hemodynamically significant stenosis.   Celiac: No aneurysm or dissection. No hemodynamically significant stenosis.   SMA: Severe stenosis of the proximal SMA. No aneurysm or dissection.   Renals: Diminutive right renal artery with possible severe narrowing at the origin. Question beading of the both renal arteries which could be seen with fibromuscular dysplasia. Patent left renal artery without aneurysm or dissection.   IMA: Patent.   Inflow: Patent right common and external iliac artery stent. Patent left external iliac artery stent with moderate to severe narrowing in the distal stent.   Veins: No obvious venous abnormality within the limitations of this arterial phase study.   Review of the MIP images confirms the above findings.   NON-VASCULAR   Hepatobiliary: No focal liver abnormality is seen. No gallstones, gallbladder wall thickening, or biliary dilatation.   Pancreas: Unremarkable.  No pancreatic ductal dilatation or surrounding inflammatory changes.   Spleen: Normal in size without focal abnormality.   Adrenals/Urinary Tract: Normal adrenal glands. No urinary calculi or hydronephrosis. Atrophic right kidney. Unremarkable bladder.   Stomach/Bowel: Stomach is within normal limits. Loops of small bowel in the left and central abdomen at the upper limits of normal in caliber without discrete transition point. Normal appendix.   Lymphatic: No lymphadenopathy.   Reproductive: No acute abnormality.   Other: No free intraperitoneal fluid or air.   Musculoskeletal: No acute fracture.   Review of the MIP images confirms the above findings.   IMPRESSION: 1. No evidence of aortic dissection or aneurysm. 2. Severe  stenosis of the proximal SMA. 3. Diminutive right renal artery with possible severe narrowing at the origin. Question beading of the both renal arteries which could be seen with fibromuscular dysplasia. 4. Loops of small bowel in the left and central abdomen at the upper limits of normal in caliber without discrete transition point, favored to represent ileus. 5. Advanced emphysema with bullous change in the left upper and lower lobes. 6. Scarring, volume loss, and architectural distortion in the left lower lobe. Partially calcified left pleural effusion/thickening, unchanged.     Electronically Signed   By: Minerva Fester M.D.   On: 08/22/2023 00:19   Impression and Plan: ATAA: Mr. Schanz presents to the clinic with a 3.7cmx3.8cm normal caliber ascending aorta, no sign of aneurysm on last 2 CTA scans. No echocardiogram has been taken. We discussed the natural history and and risk factors for growth of ascending aortic aneurysms. We still covered the importance of smoking cessation, tight blood pressure control, refraining from lifting heavy objects, and avoiding fluoroquinolones so that he does not develop an aneurysm. The patient is aware of signs and symptoms of aortic dissection and when to present to the emergency department. Since the patient's aorta is of normal caliber we do not need to continue surveillance.   History of hypertension and polysubstance abuse: HTN has improved since the patient quit using cocaine but is still elevated in the office and he states it is elevated sometimes into the 200s systolic at home. I told him to reach out to his PCP to further titrate his BP medication and continue close follow up with PCP. Patient has also cut back his alcohol use and takes vitamin B. We discussed the importance of smoking cessation for his blood pressure and to avoid thoracic aorta disease.   Aortic atherosclerosis: Continue atorvastatin and follow up with PCP.    Risk  Modification:  Statin:  Atorvastatin  Smoking cessation instruction/counseling given:  counseled patient on the dangers of tobacco use, advised patient to stop smoking, and reviewed strategies to maximize success  Patient was counseled on importance of Blood Pressure Control.  Despite Medical intervention if the patient notices persistently elevated blood pressure readings.  They are instructed to contact their Primary Care Physician  Please avoid use of Fluoroquinolones as this can potentially increase your risk of Aortic Rupture and/or Dissection  Patient educated on signs and symptoms of Aortic Dissection, handout also provided in AVS  Jenny Reichmann, PA-C 09/15/23

## 2023-09-16 NOTE — Addendum Note (Signed)
 Addended by: Alba Cory F on: 09/16/2023 12:43 PM   Modules accepted: Level of Service

## 2023-09-22 NOTE — Patient Instructions (Signed)

## 2023-09-27 ENCOUNTER — Other Ambulatory Visit: Payer: Self-pay | Admitting: Family Medicine

## 2023-09-27 DIAGNOSIS — I739 Peripheral vascular disease, unspecified: Secondary | ICD-10-CM

## 2023-09-27 DIAGNOSIS — N1831 Chronic kidney disease, stage 3a: Secondary | ICD-10-CM

## 2023-09-27 DIAGNOSIS — I700 Atherosclerosis of aorta: Secondary | ICD-10-CM

## 2023-09-29 ENCOUNTER — Encounter: Payer: Self-pay | Admitting: Physician Assistant

## 2023-09-29 ENCOUNTER — Institutional Professional Consult (permissible substitution) (INDEPENDENT_AMBULATORY_CARE_PROVIDER_SITE_OTHER): Admitting: Physician Assistant

## 2023-09-29 VITALS — BP 140/80 | HR 82 | Resp 18 | Ht 68.0 in | Wt 138.0 lb

## 2023-09-29 DIAGNOSIS — I7121 Aneurysm of the ascending aorta, without rupture: Secondary | ICD-10-CM | POA: Diagnosis not present

## 2023-10-05 ENCOUNTER — Other Ambulatory Visit: Payer: Self-pay | Admitting: Family Medicine

## 2023-10-05 DIAGNOSIS — G4709 Other insomnia: Secondary | ICD-10-CM

## 2023-10-09 ENCOUNTER — Other Ambulatory Visit: Payer: Self-pay | Admitting: Family Medicine

## 2023-10-09 DIAGNOSIS — I700 Atherosclerosis of aorta: Secondary | ICD-10-CM

## 2023-10-09 DIAGNOSIS — I739 Peripheral vascular disease, unspecified: Secondary | ICD-10-CM

## 2023-10-15 ENCOUNTER — Ambulatory Visit (INDEPENDENT_AMBULATORY_CARE_PROVIDER_SITE_OTHER): Payer: Self-pay | Admitting: Family Medicine

## 2023-10-15 ENCOUNTER — Encounter: Payer: Self-pay | Admitting: Family Medicine

## 2023-10-15 VITALS — BP 134/80 | HR 79 | Resp 16 | Ht 68.0 in | Wt 138.2 lb

## 2023-10-15 DIAGNOSIS — I739 Peripheral vascular disease, unspecified: Secondary | ICD-10-CM | POA: Diagnosis not present

## 2023-10-15 DIAGNOSIS — N1831 Chronic kidney disease, stage 3a: Secondary | ICD-10-CM | POA: Diagnosis not present

## 2023-10-15 DIAGNOSIS — K551 Chronic vascular disorders of intestine: Secondary | ICD-10-CM | POA: Diagnosis not present

## 2023-10-15 DIAGNOSIS — G546 Phantom limb syndrome with pain: Secondary | ICD-10-CM

## 2023-10-15 DIAGNOSIS — N183 Chronic kidney disease, stage 3 unspecified: Secondary | ICD-10-CM

## 2023-10-15 DIAGNOSIS — I700 Atherosclerosis of aorta: Secondary | ICD-10-CM

## 2023-10-15 DIAGNOSIS — R809 Proteinuria, unspecified: Secondary | ICD-10-CM

## 2023-10-15 DIAGNOSIS — I129 Hypertensive chronic kidney disease with stage 1 through stage 4 chronic kidney disease, or unspecified chronic kidney disease: Secondary | ICD-10-CM | POA: Diagnosis not present

## 2023-10-15 DIAGNOSIS — F1021 Alcohol dependence, in remission: Secondary | ICD-10-CM

## 2023-10-15 DIAGNOSIS — Z89511 Acquired absence of right leg below knee: Secondary | ICD-10-CM

## 2023-10-15 DIAGNOSIS — J432 Centrilobular emphysema: Secondary | ICD-10-CM

## 2023-10-15 DIAGNOSIS — F1411 Cocaine abuse, in remission: Secondary | ICD-10-CM

## 2023-10-15 MED ORDER — VALSARTAN 320 MG PO TABS: 320.0000 mg | ORAL_TABLET | Freq: Every day | ORAL | 1 refills | Status: DC

## 2023-10-15 MED ORDER — GABAPENTIN 100 MG PO CAPS: 100.0000 mg | ORAL_CAPSULE | Freq: Three times a day (TID) | ORAL | 1 refills | Status: DC

## 2023-10-15 MED ORDER — ATORVASTATIN CALCIUM 40 MG PO TABS: 40.0000 mg | ORAL_TABLET | Freq: Every day | ORAL | 1 refills | Status: DC

## 2023-10-15 NOTE — Progress Notes (Signed)
 Name: Ryan [name]   MRN: [id number]    DOB: 1956/03/08   Date:10/15/2023       Progress Note  Subjective  Chief Complaint  Chief Complaint  Patient presents with   Medical Management of Chronic Issues   Discussed the use of AI scribe software for clinical note transcription with the patient, who gave verbal consent to proceed.  History of Present Illness Ryan [name] is a 68 year old male with COPD and emphysema who presents for a regular follow-up visit.  He has returned to normal breathing following a COPD flare last month/ centrolobular emphysema.  He experiences occasional shortness of breath when lying down, requiring him to adjust his position to breathe comfortably. He produces phlegm at night but does not have a regular cough. He uses Trelegy daily and albuterol  as needed. He previously used prednisone  during illness but is not currently taking it.  He has Superior mesenteric artery stenosis, dyslipidemia , atherosclerosis of aorta and peripheral arterial disease with a right below-knee amputation and reports improvement in claudication symptoms on the left side with gabapentin . He is on clopidogrel , atorvastatin , and aspirin  for vascular disease management. He denies recent procedures and denies  side effects from his medications.  He has chronic kidney disease stage 3A, with stable kidney function. He avoids medications like Aleve and Motrin to protect his kidneys. He has a history of alcoholism and cocaine use, both of which he has significantly reduced or ceased. He drinks one glass of wine per week and has been off cocaine for nine months.  He takes hydralazine  as needed for blood pressure spikes and valsartan  daily. BP has been well controlled lately   He has a history of vitamin B1 deficiency and takes vitamin B1 and B12 supplements daily.   He had a recent CTA chest/abdominal and pelvis and only found atherosclerosis of aorta but no aneurysm by [facility] and  thoracic surgery this month. He was advised to stop smoking, keep bp under control, avoid lifting heavy objects and avoiding fluoroquinolones and to keep bp under control   He reports sleep disturbances due to his schedule, as he takes his nephew to work at night and picks him up in the morning, affecting his ability to take trazodone  regularly. He naps during the day to compensate.    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   History of colonic polyps 08/08/2023   Polyp of ascending colon 08/08/2023   Cocaine use disorder, mild, in early remission (HCC) 08/27/2022   History of alcoholism (HCC) 01/24/2022   History of amputation below knee, right (HCC) 01/24/2022   Moderate protein malnutrition (HCC) 01/24/2022   Benign hypertension with chronic kidney disease, stage III (HCC) 01/24/2022   Aneurysm of ascending aorta without rupture (HCC) 01/24/2022   Stage 3a chronic kidney disease (HCC) 01/24/2022   Phantom pain after amputation of lower extremity (HCC)    Below-knee amputation of right lower extremity (HCC) 05/17/2020   PAD (peripheral artery disease) (HCC) 07/21/2018   B12 deficiency 03/12/2018   Vitamin B1 deficiency 03/12/2018   Varicose veins of leg with pain, right 03/12/2018   Enlarged thoracic aorta (HCC) 02/24/2018   Paresthesia 02/24/2018   Emphysema lung (HCC) 02/20/2018   Atherosclerosis of aorta (HCC) 02/20/2018   Hypertension, benign 08/31/2015   Tobacco use 08/31/2015    Past Surgical History:  Procedure Laterality Date   ABDOMINAL AORTOGRAM W/LOWER EXTREMITY N/A 09/13/2019  Procedure: ABDOMINAL AORTOGRAM W/LOWER EXTREMITY;  Surgeon: Adine Hoof, MD;  Location: [facility] INVASIVE CV LAB;  Service: Cardiovascular;  Laterality: N/A;   AMPUTATION Right 05/09/2020   Procedure: RIGHT BELOW KNEE AMPUTATION;  Surgeon: Adine Hoof, MD;  Location: [facility] OR;  Service: Vascular;  Laterality:  Right;  w/ a block   COLONOSCOPY WITH PROPOFOL  N/A 02/15/2016   Procedure: COLONOSCOPY WITH PROPOFOL ;  Surgeon: Marnee Sink, MD;  Location: [facility] SURGERY CNTR;  Service: Endoscopy;  Laterality: N/A;   COLONOSCOPY WITH PROPOFOL  N/A 08/08/2023   Procedure: COLONOSCOPY WITH PROPOFOL ;  Surgeon: Marnee Sink, MD;  Location: [facility] ENDOSCOPY;  Service: Endoscopy;  Laterality: N/A;   FEMORAL-POPLITEAL BYPASS GRAFT Right 09/14/2019   Procedure: BYPASS GRAFT FEMORAL-POPLITEAL ARTERY;  Surgeon: Adine Hoof, MD;  Location: [facility] OR;  Service: Vascular;  Laterality: Right;   HERNIA REPAIR  1999   left inguinal   INSERTION OF ILIAC STENT Right 09/14/2019   Procedure: Insertion Of Common and External Iliac Stent;  Surgeon: Adine Hoof, MD;  Location: [facility] OR;  Service: Vascular;  Laterality: Right;   LOWER EXTREMITY ANGIOGRAPHY Right 05/07/2018   Procedure: LOWER EXTREMITY ANGIOGRAPHY;  Surgeon: Celso College, MD;  Location: [facility] INVASIVE CV LAB;  Service: Cardiovascular;  Laterality: Right;   LOWER EXTREMITY ANGIOGRAPHY Right 06/25/2018   Procedure: LOWER EXTREMITY ANGIOGRAPHY;  Surgeon: Celso College, MD;  Location: [facility] INVASIVE CV LAB;  Service: Cardiovascular;  Laterality: Right;   LOWER EXTREMITY ANGIOGRAPHY Right 07/01/2018   Procedure: LOWER EXTREMITY ANGIOGRAPHY;  Surgeon: Celso College, MD;  Location: [facility] INVASIVE CV LAB;  Service: Cardiovascular;  Laterality: Right;   LOWER EXTREMITY ANGIOGRAPHY Right 08/12/2018   Procedure: LOWER EXTREMITY ANGIOGRAPHY;  Surgeon: Celso College, MD;  Location: [facility] INVASIVE CV LAB;  Service: Cardiovascular;  Laterality: Right;   LOWER EXTREMITY ANGIOGRAPHY Right 09/03/2018   Procedure: LOWER EXTREMITY ANGIOGRAPHY;  Surgeon: Celso College, MD;  Location: [facility] INVASIVE CV LAB;  Service: Cardiovascular;  Laterality: Right;   LOWER EXTREMITY ANGIOGRAPHY Right 09/04/2018   Procedure: Lower Extremity Angiography;  Surgeon: Celso College, MD;  Location: [facility]  INVASIVE CV LAB;  Service: Cardiovascular;  Laterality: Right;   LOWER EXTREMITY ANGIOGRAPHY Right 10/01/2018   Procedure: LOWER EXTREMITY ANGIOGRAPHY;  Surgeon: Celso College, MD;  Location: [facility] INVASIVE CV LAB;  Service: Cardiovascular;  Laterality: Right;   LOWER EXTREMITY ANGIOGRAPHY Right 10/01/2018   Procedure: Lower Extremity Angiography;  Surgeon: Celso College, MD;  Location: [facility] INVASIVE CV LAB;  Service: Cardiovascular;  Laterality: Right;   LOWER EXTREMITY ANGIOGRAPHY Right 10/15/2018   Procedure: LOWER EXTREMITY ANGIOGRAPHY;  Surgeon: Celso College, MD;  Location: [facility] INVASIVE CV LAB;  Service: Cardiovascular;  Laterality: Right;   LOWER EXTREMITY ANGIOGRAPHY Left 10/16/2018   Procedure: Lower Extremity Angiography;  Surgeon: Celso College, MD;  Location: [facility] INVASIVE CV LAB;  Service: Cardiovascular;  Laterality: Left;   LOWER EXTREMITY ANGIOGRAPHY Left 01/20/2019   Procedure: LOWER EXTREMITY ANGIOGRAPHY;  Surgeon: Celso College, MD;  Location: [facility] INVASIVE CV LAB;  Service: Cardiovascular;  Laterality: Left;   LOWER EXTREMITY ANGIOGRAPHY Right 01/21/2019   Procedure: Lower Extremity Angiography;  Surgeon: Celso College, MD;  Location: [facility] INVASIVE CV LAB;  Service: Cardiovascular;  Laterality: Right;   LOWER EXTREMITY ANGIOGRAPHY Right 01/28/2019   Procedure: LOWER EXTREMITY ANGIOGRAPHY;  Surgeon: Celso College, MD;  Location: [facility] INVASIVE CV LAB;  Service: Cardiovascular;  Laterality: Right;   LOWER EXTREMITY ANGIOGRAPHY Right 01/29/2019  Procedure: Lower Extremity Angiography;  Surgeon: Celso College, MD;  Location: [facility] INVASIVE CV LAB;  Service: Cardiovascular;  Laterality: Right;   LOWER EXTREMITY ANGIOGRAPHY Right 04/04/2020   Procedure: LOWER EXTREMITY ANGIOGRAPHY;  Surgeon: Adine Hoof, MD;  Location: [facility]VASIVE CV LAB;  Service: Cardiovascular;  Laterality: Right;   LOWER EXTREMITY INTERVENTION Right 07/02/2018   Procedure: LOWER EXTREMITY INTERVENTION;  Surgeon: Celso College, MD;  Location: [facility] INVASIVE CV LAB;  Service: Cardiovascular;  Laterality: Right;   PERIPHERAL VASCULAR BALLOON ANGIOPLASTY Left 04/04/2020   Procedure: PERIPHERAL VASCULAR BALLOON ANGIOPLASTY;  Surgeon: Adine Hoof, MD;  Location: [facility] INVASIVE CV LAB;  Service: Cardiovascular;  Laterality: Left;  external iliac   POLYPECTOMY N/A 02/15/2016   Procedure: POLYPECTOMY;  Surgeon: Marnee Sink, MD;  Location: [facility]ERY [facility];  Service: Endoscopy;  Laterality: N/A;   POLYPECTOMY  08/08/2023   Procedure: POLYPECTOMY INTESTINAL;  Surgeon: Marnee Sink, MD;  Location: [facility] ENDOSCOPY;  Service: Endoscopy;;    Family History  Problem Relation Age of Onset   COPD Mother    Hypertension Father    Heart attack Brother     Social History   Tobacco Use   Smoking status: Every Day    Current packs/day: 0.00    Average packs/day: 0.5 packs/day for 40.4 years (20.2 ttl pk-yrs)    Types: Cigarettes    Start date: 07/21/1978    Last attempt to quit: 12/2018    Years since quitting: 4.8    Passive exposure: Never   Smokeless tobacco: Never   Tobacco comments:    Started using cocaine recently but states he will try to quit   Substance Use Topics   Alcohol use: Not Currently    Alcohol/week: 12.0 standard drinks of alcohol    Types: 12 Cans of beer per week    Comment: 4 beers / week now per pt 05/05/20     Current Outpatient Medications:    albuterol  (VENTOLIN  HFA) 108 (90 Base) MCG/ACT inhaler, Inhale 2 puffs into the lungs every 6 (six) hours as needed for wheezing or shortness of breath., Disp: 18 g, Rfl: 0   ascorbic acid  (VITAMIN C) 500 MG tablet, Take 1 tablet (500 mg total) by mouth daily., Disp: 30 tablet, Rfl: 0   aspirin  EC 81 MG tablet, Take 1 tablet (81 mg total) by mouth daily., Disp: 90 tablet, Rfl: 3   Cholecalciferol  (DIALYVITE VITAMIN D  5000) 125 MCG (5000 UT) capsule, Take 5,000 Units by mouth daily., Disp: , Rfl:    clopidogrel  (PLAVIX ) 75 MG tablet,  Take 1 tablet (75 mg total) by mouth daily., Disp: 90 tablet, Rfl: 1   Fluticasone -Umeclidin-Vilant (TRELEGY ELLIPTA ) 100-62.5-25 MCG/ACT AEPB, Inhale 1 puff into the lungs daily., Disp: 180 each, Rfl: 1   hydrALAZINE  (APRESOLINE ) 10 MG tablet, Take 1 tablet (10 mg total) by mouth 3 (three) times daily., Disp: 90 tablet, Rfl: 0   Multiple Vitamin (MULTIVITAMIN WITH MINERALS) TABS tablet, Take 1 tablet by mouth daily., Disp: , Rfl:    tadalafil  (CIALIS ) 20 MG tablet, Take 0.5-1 tablets (10-20 mg total) by mouth every other day as needed for erectile dysfunction., Disp: 10 tablet, Rfl: 2   traZODone  (DESYREL ) 100 MG tablet, TAKE 1 TABLET BY MOUTH AT BEDTIME, Disp: 90 tablet, Rfl: 0   triamcinolone  cream (KENALOG ) 0.1 %, Apply 1 Application topically 2 (two) times daily., Disp: 454 g, Rfl: 0   atorvastatin  (LIPITOR) 40 MG tablet, Take 1 tablet (40 mg total) by mouth  daily., Disp: 90 tablet, Rfl: 1   gabapentin  (NEURONTIN ) 100 MG capsule, Take 1 capsule (100 mg total) by mouth 3 (three) times daily., Disp: 270 capsule, Rfl: 1   valsartan  (DIOVAN ) 320 MG tablet, Take 1 tablet (320 mg total) by mouth daily. BP and kidney disease, Disp: 90 tablet, Rfl: 1  No Known Allergies  I personally reviewed active problem list, medication list, allergies, family history with the patient/caregiver today.   ROS  Ten systems reviewed and is negative except as mentioned in HPI    Objective Physical Exam  CONSTITUTIONAL: Patient appears well-developed and well-nourished. No distress. HEENT: Head atraumatic, normocephalic, neck supple. CARDIOVASCULAR: Normal rate, regular rhythm and normal heart sounds. No murmur heard. No edema. PULMONARY: Effort normal and breath sounds normal. No respiratory distress. ABDOMINAL: There is no tenderness or distention. MUSCULOSKELETAL: prosthesis of right lower leg  PSYCHIATRIC: Patient has a normal mood and affect. Behavior is normal. Judgment and thought content  normal.  Vitals:   10/15/23 1034  BP: 134/80  Pulse: 79  Resp: 16  SpO2: 97%  Weight: 138 lb 3.2 oz (62.7 kg)  Height: 5\' 8"  (1.727 m)    Body mass index is 21.01 kg/m.   PHQ2/9:    09/15/2023    2:00 PM 07/15/2023    9:04 AM 04/11/2023   11:20 AM 01/07/2023    7:40 AM 11/05/2022   11:06 AM  Depression screen PHQ 2/9  Decreased Interest 0 0 0 0 0  Down, Depressed, Hopeless 0 0 0 0 0  PHQ - 2 Score 0 0 0 0 0  Altered sleeping 0 0 0 0 0  Tired, decreased energy 0 0 0 0 0  Change in appetite 0 0 0 0 0  Feeling bad or failure about yourself  0 0 0 0 0  Trouble concentrating 0 0 0 0 0  Moving slowly or fidgety/restless 0 0 0 0 0  Suicidal thoughts 0 0 0 0 0  PHQ-9 Score 0 0 0 0 0  Difficult doing work/chores Not difficult at all Not difficult at all       phq 9 is negative  Fall Risk:    10/15/2023   10:33 AM 07/15/2023    9:04 AM 04/11/2023   11:20 AM 01/07/2023    7:40 AM 11/05/2022   11:06 AM  Fall Risk   Falls in the past year? 0 0 0 0 0  Number falls in past yr: 0 0  0 0  Injury with Fall?  0  0 0  Risk for fall due to : No Fall Risks No Fall Risks No Fall Risks No Fall Risks No Fall Risks  Follow up Falls prevention discussed;Education provided;Falls evaluation completed Falls prevention discussed;Education provided;Falls evaluation completed Falls prevention discussed Falls prevention discussed Falls prevention discussed     Assessment & Plan Chronic obstructive pulmonary disease (COPD) with emphysema COPD well-managed, no exacerbations, occasional nocturnal phlegm. - Continue Trelegy daily. - Use albuterol  as needed.  Benign essential hypertension Blood pressure well-controlled with valsartan . - Continue valsartan  320 mg daily. - Use hydralazine  10 mg as needed for blood pressure spikes.  Chronic kidney disease, stage 3a CKD stage 3a slightly below optimal, recent decrease in kidney function. - Avoid NSAIDs such as Aleve and Motrin. - Check kidney  function with comprehensive panel. - Encourage hydration.  Peripheral arterial disease with right below-knee amputation PAD managed with clopidogrel , atorvastatin , and aspirin . Phantom limb pain managed with gabapentin . - Continue clopidogrel , atorvastatin , and  aspirin  daily. - Continue gabapentin  100 mg three times a day.  Phantom limb pain Phantom limb pain present but less intense, gabapentin  effective. - Continue gabapentin  100 mg three times a day.  Atherosclerosis of aorta and stenosis mesenteric artery Atherosclerosis managed with clopidogrel , atorvastatin , and aspirin . - Continue clopidogrel , atorvastatin , and aspirin  daily.  Alcohol use disorder, now reduced intake Alcohol intake reduced, beneficial for liver and kidney health. - Continue to limit alcohol intake.  Cocaine use in remission Cocaine use in remission for 8-9 months, continued abstinence beneficial. - Continue abstinence from cocaine.

## 2023-10-17 ENCOUNTER — Ambulatory Visit (INDEPENDENT_AMBULATORY_CARE_PROVIDER_SITE_OTHER)

## 2023-10-17 VITALS — BP 134/80 | Ht 68.0 in | Wt 138.0 lb

## 2023-10-17 DIAGNOSIS — Z0000 Encounter for general adult medical examination without abnormal findings: Secondary | ICD-10-CM

## 2023-10-17 DIAGNOSIS — Z2821 Immunization not carried out because of patient refusal: Secondary | ICD-10-CM

## 2023-10-17 NOTE — Progress Notes (Signed)
 Because this visit was a virtual/telehealth visit,  certain criteria was not obtained, such a blood pressure, CBG if applicable, and timed get up and go. Any medications not marked as "taking" were not mentioned during the medication reconciliation part of the visit. Any vitals not documented were not able to be obtained due to this being a telehealth visit or patient was unable to self-report a recent blood pressure reading due to a lack of equipment at home via telehealth. Vitals that have been documented are verbally provided by the patient.  Subjective:   Ryan [name] is a 68 y.o. who presents for a Medicare Wellness preventive visit.  Visit Complete: Virtual I connected with  Ryan [name] on 10/17/23 by a audio enabled telemedicine application and verified that I am speaking with the correct person using two identifiers.  Patient Location: Home  Provider Location: Home Office  I discussed the limitations of evaluation and management by telemedicine. The patient expressed understanding and agreed to proceed.  Vital Signs: Because this visit was a virtual/telehealth visit, some criteria may be missing or patient reported. Any vitals not documented were not able to be obtained and vitals that have been documented are patient reported.  VideoDeclined- This patient declined Librarian, academic. Therefore the visit was completed with audio only.  Persons Participating in Visit: Patient.  AWV Questionnaire: No: Patient Medicare AWV questionnaire was not completed prior to this visit.  Cardiac Risk Factors include: advanced age (>72men, >24 women);male gender;hypertension     Objective:    Today's Vitals   10/17/23 1140  BP: 134/80  Weight: 138 lb (62.6 kg)  Height: 5\' 8"  (1.727 m)   Body mass index is 20.98 kg/m.     10/17/2023   11:45 AM 08/21/2023   12:40 PM 08/08/2023    9:25 AM 08/14/2020    8:49 AM 05/17/2020    4:49 PM 05/09/2020   12:00  PM 04/04/2020    9:21 AM  Advanced Directives  Does Patient Have a Medical Advance Directive? No No No Yes No No No  Type of Theme park manager;Living will     Copy of Healthcare Power of Attorney in Chart?    No - copy requested     Would patient like information on creating a medical advance directive? No - Patient declined No - Patient declined No - Patient declined  Yes (ED - Information included in AVS) No - Patient declined Yes (MAU/Ambulatory/Procedural Areas - Information given)    Current Medications (verified) Outpatient Encounter Medications as of 10/17/2023  Medication Sig   albuterol  (VENTOLIN  HFA) 108 (90 Base) MCG/ACT inhaler Inhale 2 puffs into the lungs every 6 (six) hours as needed for wheezing or shortness of breath.   ascorbic acid  (VITAMIN C) 500 MG tablet Take 1 tablet (500 mg total) by mouth daily.   aspirin  EC 81 MG tablet Take 1 tablet (81 mg total) by mouth daily.   atorvastatin  (LIPITOR) 40 MG tablet Take 1 tablet (40 mg total) by mouth daily.   Cholecalciferol  (DIALYVITE VITAMIN D  5000) 125 MCG (5000 UT) capsule Take 5,000 Units by mouth daily.   clopidogrel  (PLAVIX ) 75 MG tablet Take 1 tablet (75 mg total) by mouth daily.   Fluticasone -Umeclidin-Vilant (TRELEGY ELLIPTA ) 100-62.5-25 MCG/ACT AEPB Inhale 1 puff into the lungs daily.   gabapentin  (NEURONTIN ) 100 MG capsule Take 1 capsule (100 mg total) by mouth 3 (three) times daily.   hydrALAZINE  (APRESOLINE ) 10 MG  tablet Take 1 tablet (10 mg total) by mouth 3 (three) times daily.   Multiple Vitamin (MULTIVITAMIN WITH MINERALS) TABS tablet Take 1 tablet by mouth daily.   tadalafil  (CIALIS ) 20 MG tablet Take 0.5-1 tablets (10-20 mg total) by mouth every other day as needed for erectile dysfunction.   traZODone  (DESYREL ) 100 MG tablet TAKE 1 TABLET BY MOUTH AT BEDTIME   triamcinolone  cream (KENALOG ) 0.1 % Apply 1 Application topically 2 (two) times daily.   valsartan  (DIOVAN ) 320 MG  tablet Take 1 tablet (320 mg total) by mouth daily. BP and kidney disease   No facility-administered encounter medications on file as of 10/17/2023.    Allergies (verified) Levaquin [levofloxacin]   History: Past Medical History:  Diagnosis Date   CKD (chronic kidney disease)    History of blood transfusion    Hyperlipidemia    Hypertension    Neuromuscular disorder (HCC)    numbness feet   Peripheral vascular disease (HCC)    Shortness of breath dyspnea    occasional    Past Surgical History:  Procedure Laterality Date   ABDOMINAL AORTOGRAM W/LOWER EXTREMITY N/A 09/13/2019   Procedure: ABDOMINAL AORTOGRAM W/LOWER EXTREMITY;  Surgeon: Adine Hoof, MD;  Location: [facility] INVASIVE CV LAB;  Service: Cardiovascular;  Laterality: N/A;   AMPUTATION Right 05/09/2020   Procedure: RIGHT BELOW KNEE AMPUTATION;  Surgeon: Adine Hoof, MD;  Location[facility] OR;  Service: Vascular;  Laterality: Right;  w/ a block   COLONOSCOPY WITH PROPOFOL  N/A 02/15/2016   Procedure: COLONOSCOPY WITH PROPOFOL ;  Surgeon: Marnee Sink, MD;  Location: [facility] SURGERY CNTR;  Service: Endoscopy;  Laterality: N/A;   COLONOSCOPY WITH PROPOFOL  N/A 08/08/2023   Procedure: COLONOSCOPY WITH PROPOFOL ;  Surgeon: Marnee Sink, MD;  Location: [facility] ENDOSCOPY;  Service: Endoscopy;  Laterality: N/A;   FEMORAL-POPLITEAL BYPASS GRAFT Right 09/14/2019   Procedure: BYPASS GRAFT FEMORAL-POPLITEAL ARTERY;  Surgeon: Adine Hoof, MD;  Location: [facility] OR;  Service: Vascular;  Laterality: Right;   HERNIA REPAIR  1999   left inguinal   INSERTION OF ILIAC STENT Right 09/14/2019   Procedure: Insertion Of Common and External Iliac Stent;  Surgeon: Adine Hoof, MD;  Location: [facility] OR;  Service: Vascular;  Laterality: Right;   LOWER EXTREMITY ANGIOGRAPHY Right 05/07/2018   Procedure: LOWER EXTREMITY ANGIOGRAPHY;  Surgeon: Celso College, MD;  Location: [facility] INVASIVE CV LAB;  Service: Cardiovascular;   Laterality: Right;   LOWER EXTREMITY ANGIOGRAPHY Right 06/25/2018   Procedure: LOWER EXTREMITY ANGIOGRAPHY;  Surgeon: Celso College, MD;  Location: [facility] INVASIVE CV LAB;  Service: Cardiovascular;  Laterality: Right;   LOWER EXTREMITY ANGIOGRAPHY Right 07/01/2018   Procedure: LOWER EXTREMITY ANGIOGRAPHY;  Surgeon: Celso College, MD;  Location: [facility] INVASIVE CV LAB;  Service: Cardiovascular;  Laterality: Right;   LOWER EXTREMITY ANGIOGRAPHY Right 08/12/2018   Procedure: LOWER EXTREMITY ANGIOGRAPHY;  Surgeon: Celso College, MD;  Location: [facility] INVASIVE CV LAB;  Service: Cardiovascular;  Laterality: Right;   LOWER EXTREMITY ANGIOGRAPHY Right 09/03/2018   Procedure: LOWER EXTREMITY ANGIOGRAPHY;  Surgeon: Celso College, MD;  Location: [facility] INVASIVE CV LAB;  Service: Cardiovascular;  Laterality: Right;   LOWER EXTREMITY ANGIOGRAPHY Right 09/04/2018   Procedure: Lower Extremity Angiography;  Surgeon: Celso College, MD;  Location: [facility] INVASIVE CV LAB;  Service: Cardiovascular;  Laterality: Right;   LOWER EXTREMITY ANGIOGRAPHY Right 10/01/2018   Procedure: LOWER EXTREMITY ANGIOGRAPHY;  Surgeon: Celso College, MD;  Location: [facility] INVASIVE CV LAB;  Service: Cardiovascular;  Laterality:  Right;   LOWER EXTREMITY ANGIOGRAPHY Right 10/01/2018   Procedure: Lower Extremity Angiography;  Surgeon: Celso College, MD;  Location: [facility] INVASIVE CV LAB;  Service: Cardiovascular;  Laterality: Right;   LOWER EXTREMITY ANGIOGRAPHY Right 10/15/2018   Procedure: LOWER EXTREMITY ANGIOGRAPHY;  Surgeon: Celso College, MD;  Location: [facility] INVASIVE CV LAB;  Service: Cardiovascular;  Laterality: Right;   LOWER EXTREMITY ANGIOGRAPHY Left 10/16/2018   Procedure: Lower Extremity Angiography;  Surgeon: Celso College, MD;  Location: [facility] INVASIVE CV LAB;  Service: Cardiovascular;  Laterality: Left;   LOWER EXTREMITY ANGIOGRAPHY Left 01/20/2019   Procedure: LOWER EXTREMITY ANGIOGRAPHY;  Surgeon: Celso College, MD;  Location: [facility] INVASIVE CV LAB;  Service:  Cardiovascular;  Laterality: Left;   LOWER EXTREMITY ANGIOGRAPHY Right 01/21/2019   Procedure: Lower Extremity Angiography;  Surgeon: Celso College, MD;  Location: [facility] INVASIVE CV LAB;  Service: Cardiovascular;  Laterality: Right;   LOWER EXTREMITY ANGIOGRAPHY Right 01/28/2019   Procedure: LOWER EXTREMITY ANGIOGRAPHY;  Surgeon: Celso College, MD;  Location: [facility] INVASIVE CV LAB;  Service: Cardiovascular;  Laterality: Right;   LOWER EXTREMITY ANGIOGRAPHY Right 01/29/2019   Procedure: Lower Extremity Angiography;  Surgeon: Celso College, MD;  Location: [facility] INVASIVE CV LAB;  Service: Cardiovascular;  Laterality: Right;   LOWER EXTREMITY ANGIOGRAPHY Right 04/04/2020   Procedure: LOWER EXTREMITY ANGIOGRAPHY;  Surgeon: Adine Hoof, MD;  Location: [facility] INVASIVE CV LAB;  Service: Cardiovascular;  Laterality: Right;   LOWER EXTREMITY INTERVENTION Right 07/02/2018   Procedure: LOWER EXTREMITY INTERVENTION;  Surgeon: Celso College, MD;  Location: [facility] INVASIVE CV LAB;  Service: Cardiovascular;  Laterality: Right;   PERIPHERAL VASCULAR BALLOON ANGIOPLASTY Left 04/04/2020   Procedure: PERIPHERAL VASCULAR BALLOON ANGIOPLASTY;  Surgeon: Adine Hoof, MD;  Location: [facility] INVASIVE CV LAB;  Service: Cardiovascular;  Laterality: Left;  external iliac   POLYPECTOMY N/A 02/15/2016   Procedure: POLYPECTOMY;  Surgeon: Marnee Sink, MD;  Location: [facility] SUR[facility]ERY [facility];  Service: Endoscopy;  Laterality: N/A;   POLYPECTOMY  08/08/2023   Procedure: POLYPECTOMY INTESTINAL;  Surgeon: Marnee Sink, MD;  Location: [facility] ENDOSCOPY;  Service: Endoscopy;;   Family History  Problem Relation Age of Onset   COPD Mother    Hypertension Father    Heart attack Brother    Social History   Socioeconomic History   Marital status: Single    Spouse name: [name]   Number of children: 5   Years of education: Not on file   Highest education level: High school graduate  Occupational History   Occupation: Forklift     Comment: [employer]  Tobacco Use   Smoking status: Every Day    Current packs/day: 0.00    Average packs/day: 0.5 packs/day for 40.4 years (20.2 ttl pk-yrs)    Types: Cigarettes    Start date: 07/21/1978    Last attempt to quit: 12/2018    Years since quitting: 4.8    Passive exposure: Never   Smokeless tobacco: Never   Tobacco comments:    Started using cocaine recently but states he will try to quit   Vaping Use   Vaping status: Never Used  Substance and Sexual Activity   Alcohol use: Not Currently    Alcohol/week: 12.0 standard drinks of alcohol    Types: 12 Cans of beer per week    Comment: 4 beers / week now per pt 05/05/20   Drug use: Not Currently    Frequency: 2.0 times per week    Types: Cocaine  Comment: sept 2024   Sexual activity: Yes    Partners: Female    Birth control/protection: None  Other Topics Concern   Not on file  Social History Narrative   Not on file   Social Drivers of Health   Financial Resource Strain: Low Risk  (10/17/2023)   Overall Financial Resource Strain (CARDIA)    Difficulty of Paying Living Expenses: Not hard at all  Food Insecurity: No Food Insecurity (10/17/2023)   Hunger Vital Sign    Worried About Running Out of Food in the Last Year: Never true    Ran Out of Food in the Last Year: Never true  Transportation Needs: No Transportation Needs (10/17/2023)   PRAPARE - Administrator, Civil Service (Medical): No    Lack of Transportation (Non-Medical): No  Physical Activity: Sufficiently Active (10/17/2023)   Exercise Vital Sign    Days of Exercise per Week: 3 days    Minutes of Exercise per Session: 60 min  Stress: No Stress Concern Present (10/17/2023)   [employer] of Occupational Health - Occupational Stress Questionnaire    Feeling of Stress : Not at all  Social Connections: Moderately Isolated (10/17/2023)   Social Connection and Isolation Panel [NHANES]    Frequency of Communication with Friends and Family:  Three times a week    Frequency of Social Gatherings with Friends and Family: Twice a week    Attends Religious Services: Never    Database administrator or Organizations: No    Attends Engineer, structural: Never    Marital Status: Married    Tobacco Counseling Ready to quit: Not Answered Counseling given: Not Answered Tobacco comments: Started using cocaine recently but states he will try to quit     Clinical Intake:  Pre-visit preparation completed: Yes  Pain : No/denies pain     BMI - recorded: 20.98 Nutritional Status: BMI > 30  Obese Nutritional Risks: None Diabetes: No  Lab Results  Component Value Date   HGBA1C 5.0 01/17/2021     How often do you need to have someone help you when you read instructions, pamphlets, or other written materials from your doctor or pharmacy?: 1 - Never What is the last grade level you completed in school?: 12th grade     Information entered by :: Delton Stelle,cma   Activities of Daily Living     10/17/2023   11:44 AM 04/11/2023   11:21 AM  In your present state of health, do you have any difficulty performing the following activities:  Hearing? 1 1  Comment wears hearing aids   Vision? 0 0  Difficulty concentrating or making decisions? 0 0  Walking or climbing stairs? 0 1  Dressing or bathing? 0 0  Doing errands, shopping? 0 0  Preparing Food and eating ? N   Using the Toilet? N   In the past six months, have you accidently leaked urine? N   Do you have problems with loss of bowel control? N   Managing your Medications? N   Managing your Finances? N   Housekeeping or managing your Housekeeping? N     Patient Care Team: Sowles, Krichna, MD as PCP - General (Family Medicine)  Indicate any recent Medical Services you may have received from other than [facility] providers in the past year (date may be approximate).     Assessment:   This is a routine wellness examination for [facility].  Hearing/Vision  screen Hearing Screening - Comments:: Wear hearing  aids Vision Screening - Comments:: No vision   Goals Addressed             This Visit's Progress    Patient Stated       Patient states he doe not have any       Depression Screen     10/17/2023   11:45 AM 09/15/2023    2:00 PM 07/15/2023    9:04 AM 04/11/2023   11:20 AM 01/07/2023    7:40 AM 11/05/2022   11:06 AM 10/09/2022   10:55 AM  PHQ 2/9 Scores  PHQ - 2 Score 0 0 0 0 0 0 0  PHQ- 9 Score 0 0 0 0 0 0 0    Fall Risk     10/17/2023   11:44 AM 10/15/2023   10:33 AM 07/15/2023    9:04 AM 04/11/2023   11:20 AM 01/07/2023    7:40 AM  Fall Risk   Falls in the past year? 0 0 0 0 0  Number falls in past yr: 0 0 0  0  Injury with Fall? 0  0  0  Risk for fall due to : No Fall Risks No Fall Risks No Fall Risks No Fall Risks No Fall Risks  Follow up Falls prevention discussed;Falls evaluation completed Falls prevention discussed;Education provided;Falls evaluation completed Falls prevention discussed;Education provided;Falls evaluation completed Falls prevention discussed Falls prevention discussed    MEDICARE RISK AT HOME:  Medicare Risk at Home Any stairs in or around the home?: Yes If so, are there any without handrails?: No Home free of loose throw rugs in walkways, pet beds, electrical cords, etc?: Yes Adequate lighting in your home to reduce risk of falls?: Yes Life alert?: No Use of a cane, walker or w/c?: No Grab bars in the bathroom?: Yes Shower chair or bench in shower?: Yes Elevated toilet seat or a handicapped toilet?: No  TIMED UP AND GO:  Was the test performed?  No  Cognitive Function: 6CIT completed        10/17/2023   11:43 AM  6CIT Screen  What Year? 0 points  What month? 0 points  What time? 0 points  Count back from 20 0 points  Months in reverse 0 points  Repeat phrase 0 points  Total Score 0 points    Immunizations Immunization History  Administered Date(s) Administered   Fluad  Trivalent(High Dose 65+) 04/11/2023   Influenza,inj,Quad PF,6+ Mos 03/12/2018, 04/09/2019, 03/14/2020, 05/21/2021, 08/27/2022   PFIZER(Purple Top)SARS-COV-2 Vaccination 10/02/2019, 10/26/2019   PNEUMOCOCCAL CONJUGATE-20 12/13/2022   Pneumococcal Polysaccharide-23 03/12/2018   Tdap 01/17/2021   Zoster Recombinant(Shingrix) 07/13/2020, 01/17/2021, 12/13/2022, 09/09/2023    Screening Tests Health Maintenance  Topic Date Due   COVID-19 Vaccine (3 - 2024-25 season) 02/23/2023   INFLUENZA VACCINE  01/23/2024   Lung Cancer Screening  08/20/2024   Medicare Annual Wellness (AWV)  10/16/2024   Colonoscopy  08/07/2028   DTaP/Tdap/Td (2 - Td or Tdap) 01/18/2031   Pneumonia Vaccine 22+ Years old  Completed   Hepatitis C Screening  Completed   Zoster Vaccines- Shingrix  Completed   HPV VACCINES  Aged Out   Meningococcal B Vaccine  Aged Out    Health Maintenance  Health Maintenance Due  Topic Date Due   COVID-19 Vaccine (3 - 2024-25 season) 02/23/2023   Health Maintenance Items Addressed declined vaccinations and cancer screening  Additional Screening:  Vision Screening: Recommended annual ophthalmology exams for early detection of glaucoma and other disorders of the eye.  Dental Screening: Recommended annual dental exams for proper oral hygiene  Community Resource Referral / Chronic Care Management: CRR required this visit?  No   CCM required this visit?  No     Plan:     I have personally reviewed and noted the following in the patient's chart:   Medical and social history Use of alcohol, tobacco or illicit drugs  Current medications and supplements including opioid prescriptions. Patient is not currently taking opioid prescriptions. Functional ability and status Nutritional status Physical activity Advanced directives List of other physicians Hospitalizations, surgeries, and ER visits in previous 12 months Vitals Screenings to include cognitive, depression, and  falls Referrals and appointments  In addition, I have reviewed and discussed with patient certain preventive protocols, quality metrics, and best practice recommendations. A written personalized care plan for preventive services as well as general preventive health recommendations were provided to patient.     Freeda Jerry, [place]   10/17/2023   After Visit Summary: (MyChart) Due to this being a telephonic visit, the after visit summary with patients personalized plan was offered to patient via MyChart   Notes: Nothing significant to report at this time.

## 2023-10-20 ENCOUNTER — Encounter: Payer: Self-pay | Admitting: Family Medicine

## 2023-10-20 LAB — COMPREHENSIVE METABOLIC PANEL WITH GFR
AG Ratio: 1.5 (calc) (ref 1.0–2.5)
ALT: 18 U/L (ref 9–46)
AST: 22 U/L (ref 10–35)
Albumin: 4.4 g/dL (ref 3.6–5.1)
Alkaline phosphatase (APISO): 91 U/L (ref 35–144)
BUN/Creatinine Ratio: 15 (calc) (ref 6–22)
BUN: 23 mg/dL (ref 7–25)
CO2: 25 mmol/L (ref 20–32)
Calcium: 9.1 mg/dL (ref 8.6–10.3)
Chloride: 108 mmol/L (ref 98–110)
Creat: 1.49 mg/dL — ABNORMAL HIGH (ref 0.70–1.35)
Globulin: 2.9 g/dL (ref 1.9–3.7)
Glucose, Bld: 74 mg/dL (ref 65–99)
Potassium: 4.7 mmol/L (ref 3.5–5.3)
Sodium: 140 mmol/L (ref 135–146)
Total Bilirubin: 0.5 mg/dL (ref 0.2–1.2)
Total Protein: 7.3 g/dL (ref 6.1–8.1)
eGFR: 51 mL/min/{1.73_m2} — ABNORMAL LOW (ref >=60)

## 2023-10-20 LAB — B12 AND FOLATE PANEL
Folate: 10.3 ng/mL
Vitamin B-12: 921 pg/mL (ref 200–1100)

## 2023-10-20 LAB — VITAMIN D 25 HYDROXY (VIT D DEFICIENCY, FRACTURES): Vit D, 25-Hydroxy: 42 ng/mL (ref 30–100)

## 2023-10-20 LAB — LIPID PANEL
Cholesterol: 142 mg/dL (ref <200)
HDL: 60 mg/dL (ref >=40)
LDL Cholesterol (Calc): 68 mg/dL
Non-HDL Cholesterol (Calc): 82 mg/dL (ref <130)
Total CHOL/HDL Ratio: 2.4 (calc) (ref <5.0)
Triglycerides: 64 mg/dL (ref <150)

## 2023-10-20 LAB — MICROALBUMIN / CREATININE URINE RATIO
Creatinine, Urine: 117 mg/dL (ref 20–320)
Microalb Creat Ratio: 321 mg/g{creat} — ABNORMAL HIGH (ref <30)
Microalb, Ur: 37.6 mg/dL

## 2023-10-20 LAB — VITAMIN B1: Vitamin B1 (Thiamine): 401 nmol/L — ABNORMAL HIGH (ref 8–30)

## 2023-10-27 ENCOUNTER — Other Ambulatory Visit: Payer: Self-pay

## 2023-10-27 DIAGNOSIS — I70229 Atherosclerosis of native arteries of extremities with rest pain, unspecified extremity: Secondary | ICD-10-CM

## 2023-11-05 ENCOUNTER — Ambulatory Visit (HOSPITAL_COMMUNITY)
Admission: RE | Admit: 2023-11-05 | Discharge: 2023-11-05 | Disposition: A | Discharge status: Home / Self Care | Source: Ambulatory Visit | Attending: Vascular Surgery | Admitting: Vascular Surgery

## 2023-11-05 ENCOUNTER — Ambulatory Visit (HOSPITAL_BASED_OUTPATIENT_CLINIC_OR_DEPARTMENT_OTHER)
Admission: RE | Admit: 2023-11-05 | Discharge: 2023-11-05 | Disposition: A | Discharge status: Home / Self Care | Source: Ambulatory Visit | Attending: Vascular Surgery | Admitting: Vascular Surgery

## 2023-11-05 ENCOUNTER — Other Ambulatory Visit: Payer: Self-pay

## 2023-11-05 ENCOUNTER — Ambulatory Visit: Attending: Vascular Surgery | Admitting: Physician Assistant

## 2023-11-05 VITALS — BP 207/113 | HR 68 | Temp 97.9°F | Ht 68.0 in | Wt 143.5 lb

## 2023-11-05 DIAGNOSIS — I70229 Atherosclerosis of native arteries of extremities with rest pain, unspecified extremity: Secondary | ICD-10-CM

## 2023-11-05 DIAGNOSIS — I70212 Atherosclerosis of native arteries of extremities with intermittent claudication, left leg: Secondary | ICD-10-CM

## 2023-11-05 DIAGNOSIS — T82858A Stenosis of vascular prosthetic devices, implants and grafts, initial encounter: Secondary | ICD-10-CM

## 2023-11-05 DIAGNOSIS — I739 Peripheral vascular disease, unspecified: Secondary | ICD-10-CM

## 2023-11-05 LAB — VAS US ABI WITH/WO TBI: Left ABI: 0.67

## 2023-11-05 NOTE — H&P (View-Only) (Signed)
 Office Note     CC:  follow up Requesting Provider:  Sowles, Krichna, MD  HPI: Ryan [name] is a 68 y.o. (January 05, 1956) male who presents for routine follow up of PAD. He has history of right common and external iliac artery stenting as well as right femoral to AT bypass graft. Unfortunately this ultimately occluded and in November of 2021 he required a right below knee amputation. He has also required intervention on his LLE with left EIA stenting in October of 2021. Overall he has done well since. Today he reports some left calf discomfort at about 1000 yards. He says it is improved with rest. He ambulates with a RLE BKA prosthesis. He does feel that this causes him to walk with a limp and puts more pressure onto his left leg. He says he feels that contributes to his left leg fatigue as well as not exercising as much as he should. He otherwise denies any pain at rest. No tissue loss. He is medically managed on Aspirin  and Statin. He has Plavix  listed under his medications but he is unsure if he is still taking it.   Past Medical History:  Diagnosis Date   CKD (chronic kidney disease)    History of blood transfusion    Hyperlipidemia    Hypertension    Neuromuscular disorder (HCC)    numbness feet   Peripheral vascular disease (HCC)    Shortness of breath dyspnea    occasional     Past Surgical History:  Procedure Laterality Date   ABDOMINAL AORTOGRAM W/LOWER EXTREMITY N/A 09/13/2019   Procedure: ABDOMINAL AORTOGRAM W/LOWER EXTREMITY;  Surgeon: Adine Hoof, MD;  Location: [facility] INVASIVE CV LAB;  Service: Cardiovascular;  Laterality: N/A;   AMPUTATION Right 05/09/2020   Procedure: RIGHT BELOW KNEE AMPUTATION;  Surgeon: Adine Hoof, MD;  Location: [facility] OR;  Service: Vascular;  Laterality: Right;  w/ a block   COLONOSCOPY WITH PROPOFOL  N/A 02/15/2016   Procedure: COLONOSCOPY WITH PROPOFOL ;  Surgeon: Marnee Sink, MD;  Location: [facility];  Service:  Endoscopy;  Laterality: N/A;   COLONOSCOPY WITH PROPOFOL  N/A 08/08/2023   Procedure: COLONOSCOPY WITH PROPOFOL ;  Surgeon: Marnee Sink, MD;  Location: [facility] ENDOSCOPY;  Service: Endoscopy;  Laterality: N/A;   FEMORAL-POPLITEAL BYPASS GRAFT Right 09/14/2019   Procedure: BYPASS GRAFT FEMORAL-POPLITEAL ARTERY;  Surgeon: Adine Hoof, MD;  Location: [name] OR;  Service: Vascular;  Laterality: Right;   HERNIA REPAIR  1999   left inguinal   INSERTION OF ILIAC STENT Right 09/14/2019   Procedure: Insertion Of Common and External Iliac Stent;  Surgeon: Adine Hoof, MD;  Location: [facility] OR;  Service: Vascular;  Laterality: Right;   LOWER EXTREMITY ANGIOGRAPHY Right 05/07/2018   Procedure: LOWER EXTREMITY ANGIOGRAPHY;  Surgeon: Celso College, MD;  Location: [facility] INVASIVE CV LAB;  Service: Cardiovascular;  Laterality: Right;   LOWER EXTREMITY ANGIOGRAPHY Right 06/25/2018   Procedure: LOWER EXTREMITY ANGIOGRAPHY;  Surgeon: Celso College, MD;  Location: [facility] INVASIVE CV LAB;  Service: Cardiovascular;  Laterality: Right;   LOWER EXTREMITY ANGIOGRAPHY Right 07/01/2018   Procedure: LOWER EXTREMITY ANGIOGRAPHY;  Surgeon: Celso College, MD;  Location: [facility] INVASIVE CV LAB;  Service: Cardiovascular;  Laterality: Right;   LOWER EXTREMITY ANGIOGRAPHY Right 08/12/2018   Procedure: LOWER EXTREMITY ANGIOGRAPHY;  Surgeon: Celso College, MD;  Location: [facility] INVASIVE CV LAB;  Service: Cardiovascular;  Laterality: Right;   LOWER EXTREMITY ANGIOGRAPHY Right 09/03/2018   Procedure: LOWER EXTREMITY ANGIOGRAPHY;  Surgeon: Mikki Alexander  S, MD;  Location: [facility] INVASIVE CV LAB;  Service: Cardiovascular;  Laterality: Right;   LOWER EXTREMITY ANGIOGRAPHY Right 09/04/2018   Procedure: Lower Extremity Angiography;  Surgeon: Celso College, MD;  Location: [facility] INVASIVE CV LAB;  Service: Cardiovascular;  Laterality: Right;   LOWER EXTREMITY ANGIOGRAPHY Right 10/01/2018   Procedure: LOWER EXTREMITY ANGIOGRAPHY;  Surgeon: Celso College, MD;   Location: [facility] INVASIVE CV LAB;  Service: Cardiovascular;  Laterality: Right;   LOWER EXTREMITY ANGIOGRAPHY Right 10/01/2018   Procedure: Lower Extremity Angiography;  Surgeon: Celso College, MD;  Location: [facility] INVASIVE CV LAB;  Service: Cardiovascular;  Laterality: Right;   LOWER EXTREMITY ANGIOGRAPHY Right 10/15/2018   Procedure: LOWER EXTREMITY ANGIOGRAPHY;  Surgeon: Celso College, MD;  Location: [facility] INV[facility] LAB;  Service: Cardiovascular;  Laterality: Right;   LOWER EXTREMITY ANGIOGRAPHY Left 10/16/2018   Procedure: Lower Extremity Angiography;  Surgeon: Celso College, MD;  Location: [facility] INVASIVE CV LAB;  Service: Cardiovascular;  Laterality: Left;   LOWER EXTREMITY ANGIOGRAPHY Left 01/20/2019   Procedure: LOWER EXTREMITY ANGIOGRAPHY;  Surgeon: Celso College, MD;  Location: [facility] INVASIVE CV LAB;  Service: Cardiovascular;  Laterality: Left;   LOWER EXTREMITY ANGIOGRAPHY Right 01/21/2019   Procedure: Lower Extremity Angiography;  Surgeon: Celso College, MD;  Location: [facility] INVAS[facility] CV LAB;  Service: Cardiovascular;  Laterality: Right;   LOWER EXTREMITY ANGIOGRAPHY Right 01/28/2019   Procedure: LOWER EXTREMITY ANGIOGRAPHY;  Surgeon: Celso College, MD;  Location: [facility] INV[facility] LAB;  Service: Cardiovascular;  Laterality: Right;   LOWER EXTREMITY ANGIOGRAPHY Right 01/29/2019   Procedure: Lower Extremity Angiography;  Surgeon: Celso College, MD;  Location: [facility] INVASIVE CV LAB;  Service: Cardiovascular;  Laterality: Right;   LOWER EXTREMITY ANGIOGRAPHY Right 04/04/2020   Procedure: LOWER EXTREMITY ANGIOGRAPHY;  Surgeon: Adine Hoof, MD;  Location: [facility] INVASIVE CV LAB;  Service: Cardiovascular;  Laterality: Right;   LOWER EXTREMITY INTERVENTION Right 07/02/2018   Procedure: LOWER EXTREMITY INTERVENTION;  Surgeon: Celso College, MD;  Location: [facility] INVASIVE CV LAB;  Service: Cardiovascular;  Laterality: Right;   PERIPHERAL VASCULAR BALLOON ANGIOPLASTY Left 04/04/2020   Procedure: PERIPHERAL  VASCULAR BALLOON ANGIOPLASTY;  Surgeon: Adine Hoof, MD;  Location: [facility] INVASIVE CV LAB;  Service: Cardiovascular;  Laterality: Left;  external iliac   POLYPECTOMY N/A 02/15/2016   Procedure: POLYPECTOMY;  Surgeon: Marnee Sink, MD;  Location: [facility];  Service: Endoscopy;  Laterality: N/A;   POLYPECTOMY  08/08/2023   Procedure: POLYPECTOMY INTESTINAL;  Surgeon: Marnee Sink, MD;  Location: [facility] ENDOSCOPY;  Service: Endoscopy;;    Social History   Socioeconomic History   Marital status: Single    Spouse name: [name]   Number of children: 5   Years of education: Not on file   Highest education level: High school graduate  Occupational History   Occupation: Forklift    Comment: [employer]  Tobacco Use   Smoking status: Every Day    Current packs/day: 0.00    Average packs/day: 0.5 packs/day for 40.4 years (20.2 ttl pk-yrs)    Types: Cigarettes    Start date: 07/21/1978    Last attempt to quit: 12/2018    Years since quitting: 4.8    Passive exposure: Never   Smokeless tobacco: Never   Tobacco comments:    Started using cocaine recently but states he will try to quit   Vaping Use   Vaping status: Never Used  Substance and Sexual Activity   Alcohol use: Not Currently  Alcohol/week: 12.0 standard drinks of alcohol    Types: 12 Cans of beer per week    Comment: 4 beers / week now per pt 05/05/20   Drug use: Not Currently    Frequency: 2.0 times per week    Types: Cocaine    Comment: sept 2024   Sexual activity: Yes    Partners: Female    Birth control/protection: None  Other Topics Concern   Not on file  Social History Narrative   Not on file   Social Drivers of Health   Financial Resource Strain: Low Risk  (10/17/2023)   Overall Financial Resource Strain (CARDIA)    Difficulty of Paying Living Expenses: Not hard at all  Food Insecurity: No Food Insecurity (10/17/2023)   Hunger Vital Sign    Worried About Running Out of Food in the Last Year:  Never true    Ran Out of Food in the Last Year: Never true  Transportation Needs: No Transportation Needs (10/17/2023)   PRAPARE - Administrator, Civil Service (Medical): No    Lack of Transportation (Non-Medical): No  Physical Activity: Sufficiently Active (10/17/2023)   Exercise Vital Sign    Days of Exercise per Week: 3 days    Minutes of Exercise per Session: 60 min  Stress: No Stress Concern Present (10/17/2023)   [employer] of Occupational Health - Occupational Stress Questionnaire    Feeling of Stress : Not at all  Social Connections: Moderately Isolated (10/17/2023)   Social Connection and Isolation Panel [NHANES]    Frequency of Communication with Friends and Family: Three times a week    Frequency of Social Gatherings with Friends and Family: Twice a week    Attends Religious Services: Never    Database administrator or Organizations: No    Attends Banker Meetings: Never    Marital Status: Married  Catering manager Violence: Not At Risk (10/17/2023)   Humiliation, Afraid, Rape, and Kick questionnaire    Fear of Current or Ex-Partner: No    Emotionally Abused: No    Physically Abused: No    Sexually Abused: No    Family History  Problem Relation Age of Onset   COPD Mother    Hypertension Father    Heart attack Brother     Current Outpatient Medications  Medication Sig Dispense Refill   albuterol  (VENTOLIN  HFA) 108 (90 Base) MCG/ACT inhaler Inhale 2 puffs into the lungs every 6 (six) hours as needed for wheezing or shortness of breath. 18 g 0   ascorbic acid  (VITAMIN C) 500 MG tablet Take 1 tablet (500 mg total) by mouth daily. 30 tablet 0   aspirin  EC 81 MG tablet Take 1 tablet (81 mg total) by mouth daily. 90 tablet 3   atorvastatin  (LIPITOR) 40 MG tablet Take 1 tablet (40 mg total) by mouth daily. 90 tablet 1   Cholecalciferol  (DIALYVITE VITAMIN D  5000) 125 MCG (5000 UT) capsule Take 5,000 Units by mouth daily.     clopidogrel  (PLAVIX )  75 MG tablet Take 1 tablet (75 mg total) by mouth daily. 90 tablet 1   Fluticasone -Umeclidin-Vilant (TRELEGY ELLIPTA ) 100-62.5-25 MCG/ACT AEPB Inhale 1 puff into the lungs daily. 180 each 1   gabapentin  (NEURONTIN ) 100 MG capsule Take 1 capsule (100 mg total) by mouth 3 (three) times daily. 270 capsule 1   hydrALAZINE  (APRESOLINE ) 10 MG tablet Take 1 tablet (10 mg total) by mouth 3 (three) times daily. 90 tablet 0   Multiple Vitamin (  MULTIVITAMIN WITH MINERALS) TABS tablet Take 1 tablet by mouth daily.     tadalafil  (CIALIS ) 20 MG tablet Take 0.5-1 tablets (10-20 mg total) by mouth every other day as needed for erectile dysfunction. 10 tablet 2   traZODone  (DESYREL ) 100 MG tablet TAKE 1 TABLET BY MOUTH AT BEDTIME 90 tablet 0   triamcinolone  cream (KENALOG ) 0.1 % Apply 1 Application topically 2 (two) times daily. 454 g 0   valsartan  (DIOVAN ) 320 MG tablet Take 1 tablet (320 mg total) by mouth daily. BP and kidney disease 90 tablet 1   No current facility-administered medications for this visit.    Allergies  Allergen Reactions   Levaquin [Levofloxacin]     Avoid quinolones due to dilated aorta      REVIEW OF SYSTEMS:  [X]  denotes positive finding, [ ]  denotes negative finding Cardiac  Comments:  Chest pain or chest pressure:    Shortness of breath upon exertion:    Short of breath when lying flat:    Irregular heart rhythm:        Vascular    Pain in calf, thigh, or hip brought on by ambulation:    Pain in feet at night that wakes you up from your sleep:     Blood clot in your veins:    Leg swelling:         Pulmonary    Oxygen at home:    Productive cough:     Wheezing:         Neurologic    Sudden weakness in arms or legs:     Sudden numbness in arms or legs:     Sudden onset of difficulty speaking or slurred speech:    Temporary loss of vision in one eye:     Problems with dizziness:         Gastrointestinal    Blood in stool:     Vomited blood:          Genitourinary    Burning when urinating:     Blood in urine:        Psychiatric    Major depression:         Hematologic    Bleeding problems:    Problems with blood clotting too easily:        Skin    Rashes or ulcers:        Constitutional    Fever or chills:      PHYSICAL EXAMINATION:  Vitals:   11/05/23 0847  BP: (!) 207/113  Pulse: 68  Temp: 97.9 F (36.6 C)  TempSrc: Temporal  SpO2: 95%  Weight: 143 lb 8 oz (65.1 kg)  Height: 5\' 8"  (1.727 m)    General:  WDWN in NAD; vital signs documented above Gait: Not observed HENT: WNL, normocephalic Pulmonary: normal non-labored breathing without wheezing Cardiac: regular HR Abdomen: soft, NT, no masses Vascular Exam/Pulses: 2+ femoral pulses bilaterally, unable to palpate any distal pulses on right foot. Foot is warm and well perfused Extremities: without ischemic changes, without Gangrene , without cellulitis; without open wounds;  Musculoskeletal: no muscle wasting or atrophy  Neurologic: A&O X 3 Psychiatric:  The pt has Normal affect.   Non-Invasive Vascular Imaging:   +-------+-----------+-----------+------------+------------+  ABI/TBIToday's ABIToday's TBIPrevious ABIPrevious TBI  +-------+-----------+-----------+------------+------------+  Right BKA                   BKA                       +-------+-----------+-----------+------------+------------+  Left  0.67       0.44       0.77        0.44          +-------+-----------+-----------+------------+------------+   Summary:  Stenosis:  Patent right CIA and EIA stents with proximal CIA >50% stenosis.  Patent left EIA stent with distal stent and outflow stenosis >50%.   ASSESSMENT/PLAN:: 68 y.o. male here for follow up for PAD. He has extensive vascular history on RLE including iliac stents and ultimately required a right BKA when his bypass failed. He has done well with this since. Uses a prosthesis. He has external left iliac artery  stenting as well. He does have some claudication symptoms in the LLE although not lifestyle limiting. His ABI's however have been trending downward. Duplex shows elevated velocities both in the right CIA (PSV 332 cm/s) and left EIA (PSV 494 cm/s) stents that are concerning for impending occlusion. I have recommended Aortogram, arteriogram of BLE with intervention on his in stent restenosis. He is agreeable to proceed.  I will arrange this in the near future with Dr. Vikki Graves.    Deneen Finical, PA-C [facility] [phone]  Clinic MD:   Vikki Graves

## 2023-11-05 NOTE — Progress Notes (Signed)
 Office Note     CC:  follow up Requesting Provider:  Sowles, Krichna, MD  HPI: Ryan [name] is a 68 y.o. (January 05, 1956) male who presents for routine follow up of PAD. He has history of right common and external iliac artery stenting as well as right femoral to AT bypass graft. Unfortunately this ultimately occluded and in November of 2021 he required a right below knee amputation. He has also required intervention on his LLE with left EIA stenting in October of 2021. Overall he has done well since. Today he reports some left calf discomfort at about 1000 yards. He says it is improved with rest. He ambulates with a RLE BKA prosthesis. He does feel that this causes him to walk with a limp and puts more pressure onto his left leg. He says he feels that contributes to his left leg fatigue as well as not exercising as much as he should. He otherwise denies any pain at rest. No tissue loss. He is medically managed on Aspirin  and Statin. He has Plavix  listed under his medications but he is unsure if he is still taking it.   Past Medical History:  Diagnosis Date   CKD (chronic kidney disease)    History of blood transfusion    Hyperlipidemia    Hypertension    Neuromuscular disorder (HCC)    numbness feet   Peripheral vascular disease (HCC)    Shortness of breath dyspnea    occasional     Past Surgical History:  Procedure Laterality Date   ABDOMINAL AORTOGRAM W/LOWER EXTREMITY N/A 09/13/2019   Procedure: ABDOMINAL AORTOGRAM W/LOWER EXTREMITY;  Surgeon: Adine Hoof, MD;  Location: [facility] INVASIVE CV LAB;  Service: Cardiovascular;  Laterality: N/A;   AMPUTATION Right 05/09/2020   Procedure: RIGHT BELOW KNEE AMPUTATION;  Surgeon: Adine Hoof, MD;  Location: [facility] OR;  Service: Vascular;  Laterality: Right;  w/ a block   COLONOSCOPY WITH PROPOFOL  N/A 02/15/2016   Procedure: COLONOSCOPY WITH PROPOFOL ;  Surgeon: Marnee Sink, MD;  Location: [facility];  Service:  Endoscopy;  Laterality: N/A;   COLONOSCOPY WITH PROPOFOL  N/A 08/08/2023   Procedure: COLONOSCOPY WITH PROPOFOL ;  Surgeon: Marnee Sink, MD;  Location: [facility] ENDOSCOPY;  Service: Endoscopy;  Laterality: N/A;   FEMORAL-POPLITEAL BYPASS GRAFT Right 09/14/2019   Procedure: BYPASS GRAFT FEMORAL-POPLITEAL ARTERY;  Surgeon: Adine Hoof, MD;  Location: [name] OR;  Service: Vascular;  Laterality: Right;   HERNIA REPAIR  1999   left inguinal   INSERTION OF ILIAC STENT Right 09/14/2019   Procedure: Insertion Of Common and External Iliac Stent;  Surgeon: Adine Hoof, MD;  Location: [facility] OR;  Service: Vascular;  Laterality: Right;   LOWER EXTREMITY ANGIOGRAPHY Right 05/07/2018   Procedure: LOWER EXTREMITY ANGIOGRAPHY;  Surgeon: Celso College, MD;  Location: [facility] INVASIVE CV LAB;  Service: Cardiovascular;  Laterality: Right;   LOWER EXTREMITY ANGIOGRAPHY Right 06/25/2018   Procedure: LOWER EXTREMITY ANGIOGRAPHY;  Surgeon: Celso College, MD;  Location: [facility] INVASIVE CV LAB;  Service: Cardiovascular;  Laterality: Right;   LOWER EXTREMITY ANGIOGRAPHY Right 07/01/2018   Procedure: LOWER EXTREMITY ANGIOGRAPHY;  Surgeon: Celso College, MD;  Location: [facility] INVASIVE CV LAB;  Service: Cardiovascular;  Laterality: Right;   LOWER EXTREMITY ANGIOGRAPHY Right 08/12/2018   Procedure: LOWER EXTREMITY ANGIOGRAPHY;  Surgeon: Celso College, MD;  Location: [facility] INVASIVE CV LAB;  Service: Cardiovascular;  Laterality: Right;   LOWER EXTREMITY ANGIOGRAPHY Right 09/03/2018   Procedure: LOWER EXTREMITY ANGIOGRAPHY;  Surgeon: Mikki Alexander  S, MD;  Location: [facility] INVASIVE CV LAB;  Service: Cardiovascular;  Laterality: Right;   LOWER EXTREMITY ANGIOGRAPHY Right 09/04/2018   Procedure: Lower Extremity Angiography;  Surgeon: Celso College, MD;  Location: [facility] INVASIVE CV LAB;  Service: Cardiovascular;  Laterality: Right;   LOWER EXTREMITY ANGIOGRAPHY Right 10/01/2018   Procedure: LOWER EXTREMITY ANGIOGRAPHY;  Surgeon: Celso College, MD;   Location: [facility] INVASIVE CV LAB;  Service: Cardiovascular;  Laterality: Right;   LOWER EXTREMITY ANGIOGRAPHY Right 10/01/2018   Procedure: Lower Extremity Angiography;  Surgeon: Celso College, MD;  Location: [facility] INVASIVE CV LAB;  Service: Cardiovascular;  Laterality: Right;   LOWER EXTREMITY ANGIOGRAPHY Right 10/15/2018   Procedure: LOWER EXTREMITY ANGIOGRAPHY;  Surgeon: Celso College, MD;  Location: [facility] INVAS[facility] CV LAB;  Service: Cardiovascular;  Laterality: Right;   LOWER EXTREMITY ANGIOGRAPHY Left 10/16/2018   Procedure: Lower Extremity Angiography;  Surgeon: Celso College, MD;  Location: [facility] INVASIVE CV LAB;  Service: Cardiovascular;  Laterality: Left;   LOWER EXTREMITY ANGIOGRAPHY Left 01/20/2019   Procedure: LOWER EXTREMITY ANGIOGRAPHY;  Surgeon: Celso College, MD;  Location: [facility] INVASIVE CV LAB;  Service: Cardiovascular;  Laterality: Left;   LOWER EXTREMITY ANGIOGRAPHY Right 01/21/2019   Procedure: Lower Extremity Angiography;  Surgeon: Celso College, MD;  Location: [facility] INVASIVE CV LAB;  Service: Cardiovascular;  Laterality: Right;   LOWER EXTREMITY ANGIOGRAPHY Right 01/28/2019   Procedure: LOWER EXTREMITY ANGIOGRAPHY;  Surgeon: Celso College, MD;  Location: [facility] INVAS[facility] CV LAB;  Service: Cardiovascular;  Laterality: Right;   LOWER EXTREMITY ANGIOGRAPHY Right 01/29/2019   Procedure: Lower Extremity Angiography;  Surgeon: Celso College, MD;  Location: [facility] INVASIVE CV LAB;  Service: Cardiovascular;  Laterality: Right;   LOWER EXTREMITY ANGIOGRAPHY Right 04/04/2020   Procedure: LOWER EXTREMITY ANGIOGRAPHY;  Surgeon: Adine Hoof, MD;  Location: [facility] INVASIVE CV LAB;  Service: Cardiovascular;  Laterality: Right;   LOWER EXTREMITY INTERVENTION Right 07/02/2018   Procedure: LOWER EXTREMITY INTERVENTION;  Surgeon: Celso College, MD;  Location: [facility] INVASIVE CV LAB;  Service: Cardiovascular;  Laterality: Right;   PERIPHERAL VASCULAR BALLOON ANGIOPLASTY Left 04/04/2020   Procedure: PERIPHERAL  VASCULAR BALLOON ANGIOPLASTY;  Surgeon: Adine Hoof, MD;  Location: [facility] INVASIVE CV LAB;  Service: Cardiovascular;  Laterality: Left;  external iliac   POLYPECTOMY N/A 02/15/2016   Procedure: POLYPECTOMY;  Surgeon: Marnee Sink, MD;  Location: [facility];  Service: Endoscopy;  Laterality: N/A;   POLYPECTOMY  08/08/2023   Procedure: POLYPECTOMY INTESTINAL;  Surgeon: Marnee Sink, MD;  Location: [facility] ENDOSCOPY;  Service: Endoscopy;;    Social History   Socioeconomic History   Marital status: Single    Spouse name: [name]   Number of children: 5   Years of education: Not on file   Highest education level: High school graduate  Occupational History   Occupation: Forklift    Comment: [employer]  Tobacco Use   Smoking status: Every Day    Current packs/day: 0.00    Average packs/day: 0.5 packs/day for 40.4 years (20.2 ttl pk-yrs)    Types: Cigarettes    Start date: 07/21/1978    Last attempt to quit: 12/2018    Years since quitting: 4.8    Passive exposure: Never   Smokeless tobacco: Never   Tobacco comments:    Started using cocaine recently but states he will try to quit   Vaping Use   Vaping status: Never Used  Substance and Sexual Activity   Alcohol use: Not Currently  Alcohol/week: 12.0 standard drinks of alcohol    Types: 12 Cans of beer per week    Comment: 4 beers / week now per pt 05/05/20   Drug use: Not Currently    Frequency: 2.0 times per week    Types: Cocaine    Comment: sept 2024   Sexual activity: Yes    Partners: Female    Birth control/protection: None  Other Topics Concern   Not on file  Social History Narrative   Not on file   Social Drivers of Health   Financial Resource Strain: Low Risk  (10/17/2023)   Overall Financial Resource Strain (CARDIA)    Difficulty of Paying Living Expenses: Not hard at all  Food Insecurity: No Food Insecurity (10/17/2023)   Hunger Vital Sign    Worried About Running Out of Food in the Last Year:  Never true    Ran Out of Food in the Last Year: Never true  Transportation Needs: No Transportation Needs (10/17/2023)   PRAPARE - Administrator, Civil Service (Medical): No    Lack of Transportation (Non-Medical): No  Physical Activity: Sufficiently Active (10/17/2023)   Exercise Vital Sign    Days of Exercise per Week: 3 days    Minutes of Exercise per Session: 60 min  Stress: No Stress Concern Present (10/17/2023)   [employer] of Occupational Health - Occupational Stress Questionnaire    Feeling of Stress : Not at all  Social Connections: Moderately Isolated (10/17/2023)   Social Connection and Isolation Panel [NHANES]    Frequency of Communication with Friends and Family: Three times a week    Frequency of Social Gatherings with Friends and Family: Twice a week    Attends Religious Services: Never    Database administrator or Organizations: No    Attends Banker Meetings: Never    Marital Status: Married  Catering manager Violence: Not At Risk (10/17/2023)   Humiliation, Afraid, Rape, and Kick questionnaire    Fear of Current or Ex-Partner: No    Emotionally Abused: No    Physically Abused: No    Sexually Abused: No    Family History  Problem Relation Age of Onset   COPD Mother    Hypertension Father    Heart attack Brother     Current Outpatient Medications  Medication Sig Dispense Refill   albuterol  (VENTOLIN  HFA) 108 (90 Base) MCG/ACT inhaler Inhale 2 puffs into the lungs every 6 (six) hours as needed for wheezing or shortness of breath. 18 g 0   ascorbic acid  (VITAMIN C) 500 MG tablet Take 1 tablet (500 mg total) by mouth daily. 30 tablet 0   aspirin  EC 81 MG tablet Take 1 tablet (81 mg total) by mouth daily. 90 tablet 3   atorvastatin  (LIPITOR) 40 MG tablet Take 1 tablet (40 mg total) by mouth daily. 90 tablet 1   Cholecalciferol  (DIALYVITE VITAMIN D  5000) 125 MCG (5000 UT) capsule Take 5,000 Units by mouth daily.     clopidogrel  (PLAVIX )  75 MG tablet Take 1 tablet (75 mg total) by mouth daily. 90 tablet 1   Fluticasone -Umeclidin-Vilant (TRELEGY ELLIPTA ) 100-62.5-25 MCG/ACT AEPB Inhale 1 puff into the lungs daily. 180 each 1   gabapentin  (NEURONTIN ) 100 MG capsule Take 1 capsule (100 mg total) by mouth 3 (three) times daily. 270 capsule 1   hydrALAZINE  (APRESOLINE ) 10 MG tablet Take 1 tablet (10 mg total) by mouth 3 (three) times daily. 90 tablet 0   Multiple Vitamin (  MULTIVITAMIN WITH MINERALS) TABS tablet Take 1 tablet by mouth daily.     tadalafil  (CIALIS ) 20 MG tablet Take 0.5-1 tablets (10-20 mg total) by mouth every other day as needed for erectile dysfunction. 10 tablet 2   traZODone  (DESYREL ) 100 MG tablet TAKE 1 TABLET BY MOUTH AT BEDTIME 90 tablet 0   triamcinolone  cream (KENALOG ) 0.1 % Apply 1 Application topically 2 (two) times daily. 454 g 0   valsartan  (DIOVAN ) 320 MG tablet Take 1 tablet (320 mg total) by mouth daily. BP and kidney disease 90 tablet 1   No current facility-administered medications for this visit.    Allergies  Allergen Reactions   Levaquin [Levofloxacin]     Avoid quinolones due to dilated aorta      REVIEW OF SYSTEMS:  [X]  denotes positive finding, [ ]  denotes negative finding Cardiac  Comments:  Chest pain or chest pressure:    Shortness of breath upon exertion:    Short of breath when lying flat:    Irregular heart rhythm:        Vascular    Pain in calf, thigh, or hip brought on by ambulation:    Pain in feet at night that wakes you up from your sleep:     Blood clot in your veins:    Leg swelling:         Pulmonary    Oxygen at home:    Productive cough:     Wheezing:         Neurologic    Sudden weakness in arms or legs:     Sudden numbness in arms or legs:     Sudden onset of difficulty speaking or slurred speech:    Temporary loss of vision in one eye:     Problems with dizziness:         Gastrointestinal    Blood in stool:     Vomited blood:          Genitourinary    Burning when urinating:     Blood in urine:        Psychiatric    Major depression:         Hematologic    Bleeding problems:    Problems with blood clotting too easily:        Skin    Rashes or ulcers:        Constitutional    Fever or chills:      PHYSICAL EXAMINATION:  Vitals:   11/05/23 0847  BP: (!) 207/113  Pulse: 68  Temp: 97.9 F (36.6 C)  TempSrc: Temporal  SpO2: 95%  Weight: 143 lb 8 oz (65.1 kg)  Height: 5\' 8"  (1.727 m)    General:  WDWN in NAD; vital signs documented above Gait: Not observed HENT: WNL, normocephalic Pulmonary: normal non-labored breathing without wheezing Cardiac: regular HR Abdomen: soft, NT, no masses Vascular Exam/Pulses: 2+ femoral pulses bilaterally, unable to palpate any distal pulses on right foot. Foot is warm and well perfused Extremities: without ischemic changes, without Gangrene , without cellulitis; without open wounds;  Musculoskeletal: no muscle wasting or atrophy  Neurologic: A&O X 3 Psychiatric:  The pt has Normal affect.   Non-Invasive Vascular Imaging:   +-------+-----------+-----------+------------+------------+  ABI/TBIToday's ABIToday's TBIPrevious ABIPrevious TBI  +-------+-----------+-----------+------------+------------+  Right BKA                   BKA                       +-------+-----------+-----------+------------+------------+  Left  0.67       0.44       0.77        0.44          +-------+-----------+-----------+------------+------------+   Summary:  Stenosis:  Patent right CIA and EIA stents with proximal CIA >50% stenosis.  Patent left EIA stent with distal stent and outflow stenosis >50%.   ASSESSMENT/PLAN:: 68 y.o. male here for follow up for PAD. He has extensive vascular history on RLE including iliac stents and ultimately required a right BKA when his bypass failed. He has done well with this since. Uses a prosthesis. He has external left iliac artery  stenting as well. He does have some claudication symptoms in the LLE although not lifestyle limiting. His ABI's however have been trending downward. Duplex shows elevated velocities both in the right CIA (PSV 332 cm/s) and left EIA (PSV 494 cm/s) stents that are concerning for impending occlusion. I have recommended Aortogram, arteriogram of BLE with intervention on his in stent restenosis. He is agreeable to proceed.  I will arrange this in the near future with Dr. Vikki Graves.    Deneen Finical, PA-C [facility] [phone]  Clinic MD:   Vikki Graves

## 2023-12-01 ENCOUNTER — Other Ambulatory Visit: Payer: Self-pay

## 2023-12-01 ENCOUNTER — Encounter (HOSPITAL_COMMUNITY): Payer: Self-pay | Admitting: Vascular Surgery

## 2023-12-01 ENCOUNTER — Ambulatory Visit (HOSPITAL_COMMUNITY)
Admission: RE | Admit: 2023-12-01 | Discharge: 2023-12-01 | Disposition: A | Discharge status: Home / Self Care | Attending: Vascular Surgery | Admitting: Vascular Surgery

## 2023-12-01 ENCOUNTER — Encounter (HOSPITAL_COMMUNITY)
Admission: RE | Disposition: A | Discharge status: Home / Self Care | Payer: Self-pay | Source: Home / Self Care | Attending: Vascular Surgery

## 2023-12-01 DIAGNOSIS — T82856A Stenosis of peripheral vascular stent, initial encounter: Secondary | ICD-10-CM | POA: Diagnosis present

## 2023-12-01 DIAGNOSIS — Z79899 Other long term (current) drug therapy: Secondary | ICD-10-CM | POA: Insufficient documentation

## 2023-12-01 DIAGNOSIS — I70212 Atherosclerosis of native arteries of extremities with intermittent claudication, left leg: Secondary | ICD-10-CM | POA: Diagnosis not present

## 2023-12-01 DIAGNOSIS — Y832 Surgical operation with anastomosis, bypass or graft as the cause of abnormal reaction of the patient, or of later complication, without mention of misadventure at the time of the procedure: Secondary | ICD-10-CM | POA: Insufficient documentation

## 2023-12-01 DIAGNOSIS — Z89511 Acquired absence of right leg below knee: Secondary | ICD-10-CM | POA: Diagnosis not present

## 2023-12-01 DIAGNOSIS — F17210 Nicotine dependence, cigarettes, uncomplicated: Secondary | ICD-10-CM | POA: Insufficient documentation

## 2023-12-01 DIAGNOSIS — Z9889 Other specified postprocedural states: Secondary | ICD-10-CM | POA: Diagnosis not present

## 2023-12-01 DIAGNOSIS — T82858A Stenosis of vascular prosthetic devices, implants and grafts, initial encounter: Secondary | ICD-10-CM

## 2023-12-01 DIAGNOSIS — Z7982 Long term (current) use of aspirin: Secondary | ICD-10-CM | POA: Insufficient documentation

## 2023-12-01 HISTORY — PX: LOWER EXTREMITY INTERVENTION: CATH118252

## 2023-12-01 HISTORY — PX: LOWER EXTREMITY ANGIOGRAPHY: CATH118251

## 2023-12-01 HISTORY — PX: ABDOMINAL AORTOGRAM W/LOWER EXTREMITY: CATH118223

## 2023-12-01 LAB — POCT I-STAT, CHEM 8
BUN: 21 mg/dL (ref 8–23)
Calcium, Ion: 1.23 mmol/L (ref 1.15–1.40)
Chloride: 107 mmol/L (ref 98–111)
Creatinine, Ser: 1.8 mg/dL — ABNORMAL HIGH (ref 0.61–1.24)
Glucose, Bld: 68 mg/dL — ABNORMAL LOW (ref 70–99)
HCT: 39 % (ref 39.0–52.0)
Hemoglobin: 13.3 g/dL (ref 13.0–17.0)
Potassium: 3.6 mmol/L (ref 3.5–5.1)
Sodium: 141 mmol/L (ref 135–145)
TCO2: 21 mmol/L — ABNORMAL LOW (ref 22–32)

## 2023-12-01 SURGERY — ABDOMINAL AORTOGRAM W/LOWER EXTREMITY
Anesthesia: LOCAL

## 2023-12-01 MED ORDER — CLOPIDOGREL BISULFATE 300 MG PO TABS
ORAL_TABLET | ORAL | Status: DC | PRN
  Administered 2023-12-01: 75 mg via ORAL

## 2023-12-01 MED ORDER — LABETALOL HCL 5 MG/ML IV SOLN: 10.0000 mg | INTRAVENOUS | Status: DC | PRN

## 2023-12-01 MED ORDER — SODIUM CHLORIDE 0.9 % IV SOLN: INTRAVENOUS | Status: DC

## 2023-12-01 MED ORDER — CLOPIDOGREL BISULFATE 75 MG PO TABS
ORAL_TABLET | ORAL | Status: AC
  Filled 2023-12-01: qty 1

## 2023-12-01 MED ORDER — FENTANYL CITRATE (PF) 100 MCG/2ML IJ SOLN
INTRAMUSCULAR | Status: AC
Start: 2023-12-01 — End: ?
  Filled 2023-12-01: qty 2

## 2023-12-01 MED ORDER — ONDANSETRON HCL 4 MG/2ML IJ SOLN: 4.0000 mg | Freq: Four times a day (QID) | INTRAMUSCULAR | Status: DC | PRN

## 2023-12-01 MED ORDER — MORPHINE SULFATE (PF) 2 MG/ML IV SOLN: 2.0000 mg | INTRAVENOUS | Status: DC | PRN

## 2023-12-01 MED ORDER — IODIXANOL 320 MG/ML IV SOLN
INTRAVENOUS | Status: DC | PRN
  Administered 2023-12-01: 50 mL

## 2023-12-01 MED ORDER — SODIUM CHLORIDE 0.9 % IV SOLN: 250.0000 mL | INTRAVENOUS | Status: DC | PRN

## 2023-12-01 MED ORDER — HEPARIN SODIUM (PORCINE) 1000 UNIT/ML IJ SOLN
INTRAMUSCULAR | Status: AC
  Filled 2023-12-01: qty 10

## 2023-12-01 MED ORDER — SODIUM CHLORIDE 0.9% FLUSH: 3.0000 mL | INTRAVENOUS | Status: DC | PRN

## 2023-12-01 MED ORDER — OXYCODONE HCL 5 MG PO TABS: 5.0000 mg | ORAL_TABLET | ORAL | Status: DC | PRN

## 2023-12-01 MED ORDER — HYDRALAZINE HCL 20 MG/ML IJ SOLN: 5.0000 mg | INTRAMUSCULAR | Status: DC | PRN

## 2023-12-01 MED ORDER — SODIUM CHLORIDE 0.9% FLUSH: 3.0000 mL | Freq: Two times a day (BID) | INTRAVENOUS | Status: DC

## 2023-12-01 MED ORDER — HEPARIN (PORCINE) IN NACL 1000-0.9 UT/500ML-% IV SOLN
INTRAVENOUS | Status: DC | PRN
Start: 2023-12-01 — End: 2023-12-01
  Administered 2023-12-01 (×2): 500 mL

## 2023-12-01 MED ORDER — ACETAMINOPHEN 325 MG PO TABS: 650.0000 mg | ORAL_TABLET | ORAL | Status: DC | PRN

## 2023-12-01 MED ORDER — FENTANYL CITRATE (PF) 100 MCG/2ML IJ SOLN
INTRAMUSCULAR | Status: DC | PRN
  Administered 2023-12-01 (×2): 50 ug via INTRAVENOUS

## 2023-12-01 MED ORDER — PROTAMINE SULFATE 10 MG/ML IV SOLN
INTRAVENOUS | Status: AC
  Filled 2023-12-01: qty 5

## 2023-12-01 MED ORDER — HEPARIN SODIUM (PORCINE) 1000 UNIT/ML IJ SOLN
INTRAMUSCULAR | Status: DC | PRN
  Administered 2023-12-01: 6000 [IU] via INTRAVENOUS

## 2023-12-01 MED ORDER — LIDOCAINE HCL (PF) 1 % IJ SOLN
INTRAMUSCULAR | Status: AC
  Filled 2023-12-01: qty 30

## 2023-12-01 MED ORDER — MIDAZOLAM HCL 2 MG/2ML IJ SOLN
INTRAMUSCULAR | Status: AC
  Filled 2023-12-01: qty 2

## 2023-12-01 MED ORDER — SODIUM CHLORIDE 0.9 % WEIGHT BASED INFUSION: 1.0000 mL/kg/h | INTRAVENOUS | Status: DC

## 2023-12-01 MED ORDER — PROTAMINE SULFATE 10 MG/ML IV SOLN
INTRAVENOUS | Status: DC | PRN
  Administered 2023-12-01: 5 mg via INTRAVENOUS
  Administered 2023-12-01: 20 mg via INTRAVENOUS

## 2023-12-01 MED ORDER — MIDAZOLAM HCL 2 MG/2ML IJ SOLN
INTRAMUSCULAR | Status: DC | PRN
  Administered 2023-12-01: 1 mg via INTRAVENOUS

## 2023-12-01 MED ORDER — LIDOCAINE HCL (PF) 1 % IJ SOLN
INTRAMUSCULAR | Status: DC | PRN
Start: 2023-12-01 — End: 2023-12-01
  Administered 2023-12-01: 15 mL

## 2023-12-01 SURGICAL SUPPLY — 23 items
BALLOON MUSTANG 5X150X135 (BALLOONS) IMPLANT
BALLOON MUSTANG 6X60X75 (BALLOONS) IMPLANT
CATH OMNI FLUSH 5F 65CM (CATHETERS) IMPLANT
CATH TEMPO AQUA 5F 100CM (CATHETERS) IMPLANT
CLOSURE MYNX CONTROL 6F/7F (Vascular Products) IMPLANT
COVER DOME SNAP 22 D (MISCELLANEOUS) IMPLANT
DCB IN.PACT 6X60 (BALLOONS) IMPLANT
GLIDEWIRE ADV .035X260CM (WIRE) IMPLANT
KIT ANGIASSIST CO2 SYSTEM (KITS) IMPLANT
KIT ENCORE 26 ADVANTAGE (KITS) IMPLANT
KIT MICROPUNCTURE NIT STIFF (SHEATH) IMPLANT
KIT PV (KITS) ×1 IMPLANT
SET ATX-X65L (MISCELLANEOUS) IMPLANT
SHEATH CATAPULT 6FR 60 (SHEATH) IMPLANT
SHEATH PINNACLE 5F 10CM (SHEATH) IMPLANT
SHEATH PINNACLE 6F 10CM (SHEATH) IMPLANT
SHEATH PROBE COVER 6X72 (BAG) IMPLANT
STENT ELUVIA 6X150X130 (Permanent Stent) IMPLANT
STENT VIABAHN 7X29 6FR 80 (Permanent Stent) IMPLANT
SYR MEDRAD MARK 7 150ML (SYRINGE) ×1 IMPLANT
TRANSDUCER W/STOPCOCK (MISCELLANEOUS) ×1 IMPLANT
TRAY PV CATH (CUSTOM PROCEDURE TRAY) ×1 IMPLANT
WIRE BENTSON .035X145CM (WIRE) IMPLANT

## 2023-12-01 NOTE — Progress Notes (Signed)
 Up and walked and tolerated well; right groin stable, no bleeding or hematoma

## 2023-12-01 NOTE — Interval H&P Note (Signed)
 History and Physical Interval Note:  12/01/2023 7:16 AM  Ryan [name]  has presented today for surgery, with the diagnosis of stenosis of stent.  The various methods of treatment have been discussed with the patient and family. After consideration of risks, benefits and other options for treatment, the patient has consented to  Procedure(s): ABDOMINAL AORTOGRAM W/LOWER EXTREMITY (N/A) Lower Extremity Angiography (N/A) LOWER EXTREMITY INTERVENTION (N/A) as a surgical intervention.  The patient's history has been reviewed, patient examined, no change in status, stable for surgery.  I have reviewed the patient's chart and labs.  Questions were answered to the patient's satisfaction.     Angela Kell

## 2023-12-01 NOTE — Op Note (Signed)
 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 Vikki Graves, MD Procedure Performed: 1.  Percutaneous ultrasound-guided cannulation of Mynx device closure right common femoral artery 2.  Catheter in aorta and aortogram with bilateral lower extremity angiography 3.  Selection of left popliteal artery with catheter 4.  Stent of left SFA with 6 x 150 mm Eluvia postdilated with 5 mm balloon 5.  Drug-coated balloon angioplasty left external iliac artery in-stent restenosis with 6 x 60 mm in In.pact 6.  Stent right common iliac artery with 7 x 29 mm VBX 7.  Moderate sedation with fentanyl  and Versed  for 58 minutes   Indications: 68 year old male with history of right lower extremity amputation after multiple failed endovascular procedures and ultimately bypass.  He also has stenting of his left external iliac artery with life-limiting claudication as he does walk with a prosthetic.  He is indicated for angiography with possible intervention.  Patient's creatinine is elevated on the day of arrival we will plan for CO2 and contrast combination.  Findings: Right common femoral artery was patulous where there was a previous bypass and the right common extrailiac artery stent was patent.  The proximal right common iliac artery had approximately 90% stenosis and this was stented to 0% residual stenosis.  On the left side the external iliac artery stent was initially patent and distally had high-grade stenosis which had a 60 mmHg gradient across the entire stent ultimately was reduced to 12 mmHg gradient from the aortic bifurcation to the common femoral artery.  The common femoral artery does have some calcification which appears to be nonflow limiting.  The SFA itself has a long segment tapering  stenoses measuring greater than 70% with a total gradient of 50 mmHg reduced to less than 5 mmHg after primary stenting of the SFA.  There is two-vessel runoff via the very large posterior tibial artery all the way to the foot and a peroneal artery as well.   Procedure:  The patient was identified in the holding area and taken to room 8.  The patient was then placed supine on the table and prepped and draped in the usual sterile fashion.  A time out was called.  Ultrasound was used to evaluate the right common femoral artery.  There was dense scar tissue and a patulous artery there.  The area was anesthetized 1% lidocaine  and cannulated with a micropuncture needle followed by wire sheath.  Ultrasound images saved the permanent record.  We placed a Bentson wire followed by 5 French sheath and Omni catheter was placed to the level of L1 and aortogram was performed followed by bilateral pelvic angiography.  We then crossed the bifurcation and placed the catheter in the common femoral artery on the left for left lower extremity angiography with combination of contrast and CO2.  I then straight catheter up and over to the level of the popliteal artery and performed tibial angiography.  I then did a pullback gradient across the left SFA and this was noted to be 50 mmHg we placed a wire down to the popliteal artery and then a long 6 French sheath the patient was fully heparinized.  The SFA was then primarily stented with a 6 x 150 mm Eluvia postdilated with 5 mm balloon.  Completion angiography demonstrated brisk flow through the SFA and there was no gradient  of less than 5 across the area.  We then did a pullback gradient across the left extrailiac artery stent which was 60 mmHg this was initially ballooned with plain balloon and completion angiography demonstrated improved flow and a drug-coated balloon was then inflated to nominal pressure for 3 minutes.  Completion demonstrated some residual distal narrowing of the  stent although it was not grossly stenotic and the gradient was reduced to 12 mmHg all the way from the aortic bifurcation of the common femoral artery and there was a palpable common femoral pulse.  Satisfied with this we turned our attention to the right common iliac artery.  Retrograde angiography was performed which demonstrated a very high-grade stenosis there and this was primarily stented with 7 x 29 mm VBX.  Completion demonstrated no residual stenosis and there was brisk flow into the right common femoral artery.  We exchanged for a short 6 French sheath and deployed a minx device which did not appear to deployed well and pressure was held hemostasis was obtained.  The patient tolerated the procedure without immediate complication.   Contrast: 50cc  Mariusz Jubb C. Vikki Graves, MD [facility] [place] Office: [phone] Pager: [phone]

## 2023-12-05 ENCOUNTER — Telehealth: Payer: Self-pay

## 2023-12-05 NOTE — Telephone Encounter (Signed)
 Pt called triage line asking about soreness to his right groin incision s/p angiogram on 12/01/23. Patient reports soreness at the incision and upper thigh.  Assured pt some soreness is to be expected given the procedure was done 4 days ago. Advised pt to take tylenol  and keep incision clean and dry.  Advised pt to call if the pain becomes intolerable or is not reduced with the tyelenol Advised pt to call if any signs of infection (pain, purulent drainage, redness, red streaks, temp) or the incision dehisces.  Pt agreed.

## 2023-12-16 ENCOUNTER — Other Ambulatory Visit: Payer: Self-pay

## 2023-12-16 DIAGNOSIS — T82858A Stenosis of vascular prosthetic devices, implants and grafts, initial encounter: Secondary | ICD-10-CM

## 2023-12-24 ENCOUNTER — Encounter: Payer: Self-pay | Admitting: Emergency Medicine

## 2023-12-24 ENCOUNTER — Ambulatory Visit: Payer: Self-pay

## 2023-12-24 ENCOUNTER — Encounter: Payer: Self-pay | Admitting: Nurse Practitioner

## 2023-12-24 ENCOUNTER — Ambulatory Visit: Admitting: Nurse Practitioner

## 2023-12-24 ENCOUNTER — Ambulatory Visit: Admission: EM | Admit: 2023-12-24 | Discharge: 2023-12-24 | Disposition: A | Discharge status: Home / Self Care

## 2023-12-24 VITALS — BP 138/86 | HR 91 | Resp 16 | Ht 69.0 in | Wt 140.6 lb

## 2023-12-24 DIAGNOSIS — T161XXA Foreign body in right ear, initial encounter: Secondary | ICD-10-CM

## 2023-12-24 NOTE — ED Triage Notes (Signed)
 Pt reports the end of his hearing aide broke off and is stuck in his R ear. Happened a few days ago. Went to PCP and they were unable to get it out with tweezers.

## 2023-12-24 NOTE — ED Provider Notes (Signed)
 [facility]    CSN: [id number] Arrival date & time: 12/24/23  1731      History   Chief Complaint Chief Complaint  Patient presents with   Foreign Body in Ear    HPI Ryan [name] is a 68 y.o. male.   Discussed the use of AI scribe software for clinical note transcription with the patient, who gave verbal consent to proceed.   Ryan [name] is a 68 y.o. male that presents with a piece of hearing aid broken off in their right ear. The incident occurred a couple of days ago. The patient initially sought care from their primary care provider, who was unable to remove the foreign body. The patient denies experiencing any pain, headaches, or dizziness associated with the broken hearing aid piece in their ear. They describe the foreign body as just stuck in there.   The following portions of the patient's history were reviewed and updated as appropriate: allergies, current medications, past family history, past medical history, past social history, past surgical history, and problem list.     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   History of colonic polyps 08/08/2023   Polyp of ascending colon 08/08/2023   Cocaine use disorder, mild, in early remission (HCC) 08/27/2022   History of alcoholism (HCC) 01/24/2022   History of amputation below knee, right (HCC) 01/24/2022   Moderate protein malnutrition (HCC) 01/24/2022   Benign hypertension with chronic kidney disease, stage III (HCC) 01/24/2022   Aneurysm of ascending aorta without rupture (HCC) 01/24/2022   Stage 3a chronic kidney disease (HCC) 01/24/2022   Phantom pain  after amputation of lower extremity (HCC)    Below-knee amputation of right lower extremity (HCC) 05/17/2020   PAD (peripheral artery disease) (HCC) 07/21/2018   B12 deficiency 03/12/2018   Vitamin B1 deficiency 03/12/2018   Varicose veins of leg with pain, right 03/12/2018   Enlarged thoracic aorta (HCC) 02/24/2018   Paresthesia 02/24/2018   Emphysema lung (HCC) 02/20/2018   Atherosclerosis of aorta (HCC) 02/20/2018   Hypertension, benign 08/31/2015   Tobacco use 08/31/2015    Past Surgical History:  Procedure Laterality Date   ABDOMINAL AORTOGRAM W/LOWER EXTREMITY N/A 09/13/2019   Procedure: ABDOMINAL AORTOGRAM W/LOWER EXTREMITY;  Surgeon: Sheree Penne Bruckner, MD;  Location: [facility] INVASIVE CV LAB;  Service: Cardiovascular;  Laterality: N/A;   ABDOMINAL AORTOGRAM W/LOWER EXTREMITY N/A 12/01/2023   Procedure: ABDOMINAL AORTOGRAM W/LOWER EXTREMITY;  Surgeon: Sheree Penne Bruckner, MD;  Location: [facility] INVASIVE CV LAB;  Service: Cardiovascular;  Laterality: N/A;   AMPUTATION Right 05/09/2020   Procedure: RIGHT BELOW KNEE AMPUTATION;  Surgeon: Sheree Penne Bruckner, MD;  Location: [facility] OR;  Service: Vascular;  Laterality: Right;  w/ a block   COLONOSCOPY WITH PROPOFOL  N/A 02/15/2016   Procedure: COLONOSCOPY WITH PROPOFOL ;  Surgeon: Rogelia Copping, MD;  Location: [facility] SURGERY CNTR;  Service: Endoscopy;  Laterality: N/A;   COLONOSCOPY WITH PROPOFOL  N/A 08/08/2023   Procedure: COLONOSCOPY WITH PROPOFOL ;  Surgeon: Copping Rogelia, MD;  Location: [facility] ENDOSCOPY;  Service: Endoscopy;  Laterality: N/A;   FEMORAL-POPLITEAL BYPASS GRAFT Right 09/14/2019  Procedure: BYPASS GRAFT FEMORAL-POPLITEAL ARTERY;  Surgeon: Sheree Penne Bruckner, MD;  Location: [facility] OR;  Service: Vascular;  Laterality: Right;   HERNIA REPAIR  1999   left inguinal   INSERTION OF ILIAC STENT Right 09/14/2019   Procedure: Insertion Of Common and External Iliac Stent;  Surgeon: Sheree Penne Bruckner, MD;  Location: [facility] OR;   Service: Vascular;  Laterality: Right;   LOWER EXTREMITY ANGIOGRAPHY Right 05/07/2018   Procedure: LOWER EXTREMITY ANGIOGRAPHY;  Surgeon: Marea Selinda RAMAN, MD;  Location: [facility] INVASIVE CV LAB;  Service: Cardiovascular;  Laterality: Right;   LOWER EXTREMITY ANGIOGRAPHY Right 06/25/2018   Procedure: LOWER EXTREMITY ANGIOGRAPHY;  Surgeon: Marea Selinda RAMAN, MD;  Location: [facility] INVASIVE CV LAB;  Service: Cardiovascular;  Laterality: Right;   LOWER EXTREMITY ANGIOGRAPHY Right 07/01/2018   Procedure: LOWER EXTREMITY ANGIOGRAPHY;  Surgeon: Marea Selinda RAMAN, MD;  Location: [facility] INVASIVE CV LAB;  Service: Cardiovascular;  Laterality: Right;   LOWER EXTREMITY ANGIOGRAPHY Right 08/12/2018   Procedure: LOWER EXTREMITY ANGIOGRAPHY;  Surgeon: Marea Selinda RAMAN, MD;  Location: [facility] INVASIVE CV LAB;  Service: Cardiovascular;  Laterality: Right;   LOWER EXTREMITY ANGIOGRAPHY Right 09/03/2018   Procedure: LOWER EXTREMITY ANGIOGRAPHY;  Surgeon: Marea Selinda RAMAN, MD;  Location: [facility] INVASIVE CV LAB;  Service: Cardiovascular;  Laterality: Right;   LOWER EXTREMITY ANGIOGRAPHY Right 09/04/2018   Procedure: Lower Extremity Angiography;  Surgeon: Marea Selinda RAMAN, MD;  Location: [facility] INVASIVE CV LAB;  Service: Cardiovascular;  Laterality: Right;   LOWER EXTREMITY ANGIOGRAPHY Right 10/01/2018   Procedure: LOWER EXTREMITY ANGIOGRAPHY;  Surgeon: Marea Selinda RAMAN, MD;  Location: [facility] INVASIVE CV LAB;  Service: Cardiovascular;  Laterality: Right;   LOWER EXTREMITY ANGIOGRAPHY Right 10/01/2018   Procedure: Lower Extremity Angiography;  Surgeon: Marea Selinda RAMAN, MD;  Location: [facility] INVASIVE CV LAB;  Service: Cardiovascular;  Laterality: Right;   LOWER EXTREMITY ANGIOGRAPHY Right 10/15/2018   Procedure: LOWER EXTREMITY ANGIOGRAPHY;  Surgeon: Marea Selinda RAMAN, MD;  Location: [facility] INVASIVE CV LAB;  Service: Cardiovascular;  Laterality: Right;   LOWER EXTREMITY ANGIOGRAPHY Left 10/16/2018   Procedure: Lower Extremity Angiography;  Surgeon: Marea Selinda RAMAN, MD;  Location: [facility]  INVASIVE CV LAB;  Service: Cardiovascular;  Laterality: Left;   LOWER EXTREMITY ANGIOGRAPHY Left 01/20/2019   Procedure: LOWER EXTREMITY ANGIOGRAPHY;  Surgeon: Marea Selinda RAMAN, MD;  Location: [facility] INVASIVE CV LAB;  Service: Cardiovascular;  Laterality: Left;   LOWER EXTREMITY ANGIOGRAPHY Right 01/21/2019   Procedure: Lower Extremity Angiography;  Surgeon: Marea Selinda RAMAN, MD;  Location: [facility] INVASIVE CV LAB;  Service: Cardiovascular;  Laterality: Right;   LOWER EXTREMITY ANGIOGRAPHY Right 01/28/2019   Procedure: LOWER EXTREMITY ANGIOGRAPHY;  Surgeon: Marea Selinda RAMAN, MD;  Location: [facility] INVASIVE CV LAB;  Service: Cardiovascular;  Laterality: Right;   LOWER EXTREMITY ANGIOGRAPHY Right 01/29/2019   Procedure: Lower Extremity Angiography;  Surgeon: Marea Selinda RAMAN, MD;  Location: [facility] INVASIVE CV LAB;  Service: Cardiovascular;  Laterality: Right;   LOWER EXTREMITY ANGIOGRAPHY Right 04/04/2020   Procedure: LOWER EXTREMITY ANGIOGRAPHY;  Surgeon: Sheree Penne Bruckner, MD;  Location: [name] INVASIVE CV LAB;  Service: Cardiovascular;  Laterality: Right;   LOWER EXTREMITY ANGIOGRAPHY N/A 12/01/2023   Procedure: Lower Extremity Angiography;  Surgeon: Sheree Penne Bruckner, MD;  Location: [facility] INVASIVE CV LAB;  Service: Cardiovascular;  Laterality: N/A;   LOWER EXTREMITY INTERVENTION Right 07/02/2018   Procedure: LOWER EXTREMITY INTERVENTION;  Surgeon: Marea Selinda RAMAN, MD;  Location: [facility] INVASIVE CV LAB;  Service: Cardiovascular;  Laterality: Right;   LOWER EXTREMITY INTERVENTION N/A  12/01/2023   Procedure: LOWER EXTREMITY INTERVENTION;  Surgeon: Sheree Penne Bruckner, MD;  Location: [facility] INVASIVE CV LAB;  Service: Cardiovascular;  Laterality: N/A;   PERIPHERAL VASCULAR BALLOON ANGIOPLASTY Left 04/04/2020   Procedure: PERIPHERAL VASCULAR BALLOON ANGIOPLASTY;  Surgeon: Sheree Penne Bruckner, MD;  Location: [facility] INVASIVE CV LAB;  Service: Cardiovascular;  Laterality: Left;  external iliac   POLYPECTOMY N/A 02/15/2016    Procedure: POLYPECTOMY;  Surgeon: Rogelia Copping, MD;  Location: [facility] SURGERY CNTR;  Service: Endoscopy;  Laterality: N/A;   POLYPECTOMY  08/08/2023   Procedure: POLYPECTOMY INTESTINAL;  Surgeon: Copping Rogelia, MD;  Location: [facility] ENDOSCOPY;  Service: Endoscopy;;       Home Medications    Prior to Admission medications   Medication Sig Start Date End Date Taking? Authorizing Provider  aspirin  EC 81 MG tablet Take 1 tablet (81 mg total) by mouth daily. 08/13/18  Yes Stegmayer, Suzen LABOR, PA-C  atorvastatin  (LIPITOR) 40 MG tablet Take 1 tablet (40 mg total) by mouth daily. 10/15/23  Yes Sowles, Krichna, MD  clopidogrel  (PLAVIX ) 75 MG tablet Take 1 tablet (75 mg total) by mouth daily. 08/18/23  Yes Sowles, Krichna, MD  Fluticasone -Umeclidin-Vilant (TRELEGY ELLIPTA ) 100-62.5-25 MCG/ACT AEPB Inhale 1 puff into the lungs daily. 04/11/23  Yes Sowles, Krichna, MD  gabapentin  (NEURONTIN ) 100 MG capsule Take 1 capsule (100 mg total) by mouth 3 (three) times daily. 10/15/23  Yes Sowles, Krichna, MD  Thiamine  HCl (VITAMIN B-1) 250 MG tablet Take 250 mg by mouth every other day.   Yes [provider]  traZODone  (DESYREL ) 100 MG tablet TAKE 1 TABLET BY MOUTH AT BEDTIME 10/06/23  Yes Sowles, Krichna, MD  valsartan  (DIOVAN ) 320 MG tablet Take 1 tablet (320 mg total) by mouth daily. BP and kidney disease 10/15/23  Yes Sowles, Krichna, MD  vitamin B-12 (CYANOCOBALAMIN ) 500 MCG tablet Take 500 mcg by mouth every other day.   Yes [provider]  acetaminophen  (TYLENOL ) 650 MG CR tablet Take 1,300 mg by mouth every 8 (eight) hours as needed for pain.    [provider]  albuterol  (VENTOLIN  HFA) 108 (90 Base) MCG/ACT inhaler Inhale 2 puffs into the lungs every 6 (six) hours as needed for wheezing or shortness of breath. Patient not taking: Reported on 12/24/2023 04/11/23   Sowles, Krichna, MD  tadalafil  (CIALIS ) 20 MG tablet Take 0.5-1 tablets (10-20 mg total) by mouth every other day as needed  for erectile dysfunction. 01/07/23   Sowles, Krichna, MD    Family History Family History  Problem Relation Age of Onset   COPD Mother    Hypertension Father    Heart attack Brother     Social History Social History   Tobacco Use   Smoking status: Every Day    Current packs/day: 0.00    Average packs/day: 0.5 packs/day for 40.4 years (20.2 ttl pk-yrs)    Types: Cigarettes    Start date: 07/21/1978    Last attempt to quit: 12/2018    Years since quitting: 5.0    Passive exposure: Never   Smokeless tobacco: Never   Tobacco comments:    Started using cocaine recently but states he will try to quit   Vaping Use   Vaping status: Never Used  Substance Use Topics   Alcohol use: Not Currently    Alcohol/week: 12.0 standard drinks of alcohol    Types: 12 Cans of beer per week    Comment: 4 beers / week now per pt 05/05/20   Drug use: Not  Currently    Frequency: 2.0 times per week    Types: Cocaine    Comment: sept 2024     Allergies   Levaquin [levofloxacin] and Quinolones   Review of Systems Review of Systems  HENT:  Positive for hearing loss.   Neurological:  Negative for dizziness and headaches.  All other systems reviewed and are negative.    Physical Exam Triage Vital Signs ED Triage Vitals [12/24/23 1826]  Encounter Vitals Group     BP (!) 161/93     Girls Systolic BP Percentile      Girls Diastolic BP Percentile      Boys Systolic BP Percentile      Boys Diastolic BP Percentile      Pulse Rate 70     Resp 16     Temp 98.2 F (36.8 C)     Temp Source Oral     SpO2 96 %     Weight      Height      Head Circumference      Peak Flow      Pain Score 0     Pain Loc      Pain Education      Exclude from Growth Chart    No data found.  Updated Vital Signs BP (!) 153/90 (BP Location: Left Arm)   Pulse 70   Temp 98.2 F (36.8 C) (Oral)   Resp 16   SpO2 96%   Visual Acuity Right Eye Distance:   Left Eye Distance:   Bilateral Distance:     Right Eye Near:   Left Eye Near:    Bilateral Near:     Physical Exam Vitals reviewed.  Constitutional:      General: He is awake. He is not in acute distress.    Appearance: Normal appearance. He is well-developed. He is not ill-appearing, toxic-appearing or diaphoretic.  HENT:     Head: Normocephalic.     Right Ear: Hearing normal. A foreign body is present.     Left Ear: Hearing normal.     Nose: Nose normal.     Mouth/Throat:     Mouth: Mucous membranes are moist.  Eyes:     General: Vision grossly intact.     Conjunctiva/sclera: Conjunctivae normal.  Cardiovascular:     Rate and Rhythm: Normal rate and regular rhythm.     Heart sounds: Normal heart sounds.  Pulmonary:     Effort: Pulmonary effort is normal.     Breath sounds: Normal breath sounds and air entry.  Musculoskeletal:        General: Normal range of motion.     Cervical back: Full passive range of motion without pain, normal range of motion and neck supple.  Skin:    General: Skin is warm and dry.  Neurological:     General: No focal deficit present.     Mental Status: He is alert and oriented to person, place, and time.  Psychiatric:        Speech: Speech normal.        Behavior: Behavior is cooperative.      UC Treatments / Results  Labs (all labs ordered are listed, but only abnormal results are displayed) Labs Reviewed - No data to display  EKG   Radiology No results found.  Procedures Procedures (including critical care time)  Medications Ordered in UC Medications - No data to display  Initial Impression / Assessment and Plan / UC Course  I  have reviewed the triage vital signs and the nursing notes.  Pertinent labs & imaging results that were available during my care of the patient were reviewed by me and considered in my medical decision making (see chart for details).     The patient presented with a broken piece of hearing aid lodged in the right ear for several days. She had  previously seen her primary care provider, who was unable to remove the object. She denied any associated pain, headaches, dizziness, or hearing changes. On examination, the foreign body was visualized and successfully removed using alligator forceps without complication. Post-removal inspection of the ear canal revealed no redness, bleeding, or swelling, and the tympanic membrane was intact. No further treatment is necessary at this time. The patient was advised to follow up with her audiologist or ENT if she experiences any changes in hearing, pain, drainage, or other symptoms. ED evaluation is recommended for sudden ear pain, hearing loss, or signs of infection.  Today's evaluation has revealed no signs of a dangerous process. Discussed diagnosis with patient and/or guardian. Patient and/or guardian aware of their diagnosis, possible red flag symptoms to watch out for and need for close follow up. Patient and/or guardian understands verbal and written discharge instructions. Patient and/or guardian comfortable with plan and disposition.  Patient and/or guardian has a clear mental status at this time, good insight into illness (after discussion and teaching) and has clear judgment to make decisions regarding their care  Documentation was completed with the aid of voice recognition software. Transcription may contain typographical errors.  Final Clinical Impressions(s) / UC Diagnoses   Final diagnoses:  Foreign body of right ear, initial encounter     Discharge Instructions      You were seen today for removal of a broken piece of your hearing aid that was lodged in your right ear for the past few days. The object was successfully removed in the clinic using alligator forceps without any complications. After removal, your ear canal was examined and showed no signs of redness, swelling, bleeding, or injury.  There is no specific treatment needed at this time. You may notice mild sensitivity or  fullness in the ear, which should improve over the next day or two. Avoid inserting anything into your ear, including cotton swabs or hearing aids, until the ear feels completely back to normal. If needed, you may use over-the-counter acetaminophen  or ibuprofen for mild discomfort.  Contact your primary care provider or audiologist if you experience new ear pain, decreased hearing, drainage, or a feeling of fullness that does not improve. Seek emergency care if you develop sudden hearing loss, severe pain, dizziness, or signs of infection such as fever or foul-smelling drainage.      ED Prescriptions   None    PDMP not reviewed this encounter.   Iola Lukes, [place] 12/24/23 1943

## 2023-12-24 NOTE — Discharge Instructions (Addendum)
 You were seen today for removal of a broken piece of your hearing aid that was lodged in your right ear for the past few days. The object was successfully removed in the clinic using alligator forceps without any complications. After removal, your ear canal was examined and showed no signs of redness, swelling, bleeding, or injury.  There is no specific treatment needed at this time. You may notice mild sensitivity or fullness in the ear, which should improve over the next day or two. Avoid inserting anything into your ear, including cotton swabs or hearing aids, until the ear feels completely back to normal. If needed, you may use over-the-counter acetaminophen  or ibuprofen for mild discomfort.  Contact your primary care provider or audiologist if you experience new ear pain, decreased hearing, drainage, or a feeling of fullness that does not improve. Seek emergency care if you develop sudden hearing loss, severe pain, dizziness, or signs of infection such as fever or foul-smelling drainage.

## 2023-12-24 NOTE — Progress Notes (Signed)
 BP 138/86   Pulse 91   Resp 16   Ht 5' 9 (1.753 m)   Wt 140 lb 9.6 oz (63.8 kg)   SpO2 98%   BMI 20.76 kg/m    Subjective:    Patient ID: Ryan [name], male    DOB: 1955-10-03, 68 y.o.   MRN: [id number]  HPI: Ryan [name] is a 68 y.o. male presenting today for complaints of his right ear feeling clogged. Patient states this started two days ago. He reports that he feels like a piece of his hearing aid is stuck in his ear. He reports he can't hear at all out of his right ear. He denies ear pain or tenderness. Denies discharge from ear.     Patient sees [facility] in [place]. Tried calling their office but they are closed for the holiday.         12/24/2023   12:55 PM 10/17/2023   11:45 AM 09/15/2023    2:00 PM  Depression screen PHQ 2/9  Decreased Interest 0 0 0  Down, Depressed, Hopeless 0 0 0  PHQ - 2 Score 0 0 0  Altered sleeping  0 0  Tired, decreased energy  0 0  Change in appetite  0 0  Feeling bad or failure about yourself   0 0  Trouble concentrating  0 0  Moving slowly or fidgety/restless  0 0  Suicidal thoughts  0 0  PHQ-9 Score  0 0  Difficult doing work/chores  Not difficult at all Not difficult at all    Relevant past medical, surgical, family and social history reviewed and updated as indicated. Interim medical history since our last visit reviewed. Allergies and medications reviewed and updated.  Review of Systems  Constitutional: Negative for fever or weight change. HEENT: Ear: Positive for R ear hearing loss. Denies pain or discharge   Respiratory: Negative for cough and shortness of breath.   Cardiovascular: Negative for chest pain or palpitations.  Gastrointestinal: Negative for abdominal pain, no bowel changes.  Musculoskeletal: Negative for gait problem or joint swelling.  Skin: Negative for rash.  Neurological: Negative for dizziness or headache.  No other specific complaints in a complete review of systems (except  as listed in HPI above).      Objective:     BP 138/86   Pulse 91   Resp 16   Ht 5' 9 (1.753 m)   Wt 140 lb 9.6 oz (63.8 kg)   SpO2 98%   BMI 20.76 kg/m    Wt Readings from Last 3 Encounters:  12/24/23 140 lb 9.6 oz (63.8 kg)  12/01/23 140 lb (63.5 kg)  11/05/23 143 lb 8 oz (65.1 kg)    Physical Exam Constitutional:      Appearance: Normal appearance.  HENT:     Right Ear: Decreased hearing noted. A foreign body is present.     Ears:     Comments: Piece of hearing aid Cardiovascular:     Rate and Rhythm: Normal rate and regular rhythm.  Pulmonary:     Effort: Pulmonary effort is normal.     Breath sounds: Normal breath sounds.  Neurological:     Mental Status: He is alert.      Results for orders placed or performed during the hospital encounter of 12/01/23  I-STAT, chem 8   Collection Time: 12/01/23  5:55 AM  Result Value Ref Range   Sodium 141 135 - 145 mmol/L   Potassium 3.6 3.5 -  5.1 mmol/L   Chloride 107 98 - 111 mmol/L   BUN 21 8 - 23 mg/dL   Creatinine, Ser 8.19 (H) 0.61 - 1.24 mg/dL   Glucose, Bld 68 (L) 70 - 99 mg/dL   Calcium , Ion 1.23 1.15 - 1.40 mmol/L   TCO2 21 (L) 22 - 32 mmol/L   Hemoglobin 13.3 13.0 - 17.0 g/dL   HCT 60.9 60.9 - 47.9 %          Assessment & Plan:   Problem List Items Addressed This Visit   None Visit Diagnoses       Foreign body in right ear, initial encounter    -  Primary   Tried to remove in office but was unsuccessful. Sent to urgent care for removal.        Assessment and Plan: Foreign body noted in R ear. Called [facility], where patient got his hearing aids but they are closed. Discussed the risks of removing foreign body in our office, but patient wanted to proceed. We were unable to successfully remove the foreign body with tweezers. Pt sent to urgent care for removal.          Follow up plan: Return if symptoms worsen or fail to improve.

## 2023-12-24 NOTE — Telephone Encounter (Signed)
 FYI Only or Action Required?: Action required by provider: request for appointment.  Patient was last seen in primary care on 10/15/2023 by Glenard Mire, MD. Called Nurse Triage reporting Hearing Problem. Symptoms began several days ago. Interventions attempted: Nothing. Symptoms are: unchanged.  Triage Disposition: See HCP Within 4 Hours (Or PCP Triage)  Patient/caregiver understands and will follow disposition?: Yes

## 2023-12-24 NOTE — Telephone Encounter (Signed)
 Reason for Disposition . [1] Hearing loss in one or both ears AND [2] sudden onset AND [3] present now  Answer Assessment - Initial Assessment Questions 1. DESCRIPTION: What type of hearing problem are you having? Describe it for me. (e.g., complete hearing loss, partial loss)     Ear clogged 2. LOCATION: One or both ears? If one, ask: Which ear?     Right  3. SEVERITY: Can you hear anything? If Yes, ask: What can you hear? (e.g., ticking watch, whisper, talking)   - MILD:  Difficulty hearing soft speech, quiet library sounds, or speech from a distance or over background noise.   - MODERATE: Difficulty hearing normal speech even at closed distances.   - SEVERE: Unable to hear most normal conversation and talking; only able to hear loud sounds such as an alarm clock.     severe 4. ONSET: When did this begin? Did it start suddenly or come on gradually?     Several days  5. PATTERN: Does this come and go, or has it been constant since it started?     Constant  6. PAIN: Is there any pain in your ear(s)?  (Scale 1-10; or mild, moderate, severe)   - NONE (0): no pain   - MILD (1-3): doesn't interfere with normal activities    - MODERATE (4-7): interferes with normal activities or awakens from sleep    - SEVERE (8-10): excruciating pain, unable to do any normal activities      none 7. CAUSE: What do you think is causing this hearing problem?     Object in ear 8. OTHER SYMPTOMS: Do you have any other symptoms? (e.g., dizziness, ringing in ears)     denies    Pt wear hearing aids and feels a piece of rubber of broken off.  Protocols used: Hearing Loss or Change-A-AH

## 2023-12-31 ENCOUNTER — Other Ambulatory Visit: Payer: Self-pay

## 2023-12-31 ENCOUNTER — Ambulatory Visit (HOSPITAL_BASED_OUTPATIENT_CLINIC_OR_DEPARTMENT_OTHER)
Admission: RE | Admit: 2023-12-31 | Discharge: 2023-12-31 | Disposition: A | Discharge status: Home / Self Care | Source: Ambulatory Visit | Attending: Vascular Surgery | Admitting: Vascular Surgery

## 2023-12-31 ENCOUNTER — Ambulatory Visit (INDEPENDENT_AMBULATORY_CARE_PROVIDER_SITE_OTHER): Admitting: Physician Assistant

## 2023-12-31 ENCOUNTER — Ambulatory Visit (HOSPITAL_COMMUNITY)
Admission: RE | Admit: 2023-12-31 | Discharge: 2023-12-31 | Disposition: A | Discharge status: Home / Self Care | Source: Ambulatory Visit | Attending: Vascular Surgery | Admitting: Vascular Surgery

## 2023-12-31 ENCOUNTER — Other Ambulatory Visit: Payer: Self-pay | Admitting: Family Medicine

## 2023-12-31 ENCOUNTER — Telehealth: Payer: Self-pay

## 2023-12-31 VITALS — BP 154/99 | HR 69 | Temp 97.9°F | Ht 69.0 in | Wt 143.5 lb

## 2023-12-31 DIAGNOSIS — I70212 Atherosclerosis of native arteries of extremities with intermittent claudication, left leg: Secondary | ICD-10-CM | POA: Insufficient documentation

## 2023-12-31 DIAGNOSIS — G4709 Other insomnia: Secondary | ICD-10-CM

## 2023-12-31 DIAGNOSIS — T82858A Stenosis of vascular prosthetic devices, implants and grafts, initial encounter: Secondary | ICD-10-CM

## 2023-12-31 DIAGNOSIS — I739 Peripheral vascular disease, unspecified: Secondary | ICD-10-CM | POA: Insufficient documentation

## 2023-12-31 DIAGNOSIS — T169XXA Foreign body in ear, unspecified ear, initial encounter: Secondary | ICD-10-CM

## 2023-12-31 LAB — VAS US ABI WITH/WO TBI: Left ABI: 1

## 2023-12-31 NOTE — Telephone Encounter (Signed)
 Referral order placed.

## 2023-12-31 NOTE — Telephone Encounter (Signed)
 Copied from CRM [id number]. Topic: Clinical - Medical Advice >> Dec 31, 2023 11:42 AM Nathanel BROCKS wrote: Reason for CRM: pt needed hearing aide taken out and we could not do it so we sent him to urgent care. The other one needs to be taken out and he can't afford the copay is there any other referrals or plans he can use to get this out. His care navigator, Mliss Limes has called and requested this information. [phone]. Ext [phone]

## 2023-12-31 NOTE — Progress Notes (Signed)
 Office Note     CC:  follow up Requesting Provider:  Sowles, Krichna, MD  HPI: Ryan [name] is a 68 y.o. (08-23-1955) male who presents status post aortogram with drug-coated balloon angioplasty of the left external iliac artery in-stent restenosis, right common iliac stenting, and left SFA stenting by Dr. Sheree due to lifestyle limiting claudication with restenosis of left iliac stents.  This was performed on 12/01/2023.  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  He is ambulatory with a prosthetic.  He reports his claudication symptoms have resolved in his left leg.  He is taking his aspirin , Plavix , statin daily.  He is smoking about 4 to 5 cigarettes a day.  He denies any trouble with the right groin access site.   Past Medical History:  Diagnosis Date   CKD (chronic kidney disease)    History of blood transfusion    Hyperlipidemia    Hypertension    Neuromuscular disorder (HCC)    numbness feet   Peripheral vascular disease (HCC)    Shortness of breath dyspnea    occasional     Past Surgical History:  Procedure Laterality Date   ABDOMINAL AORTOGRAM W/LOWER EXTREMITY N/A 09/13/2019   Procedure: ABDOMINAL AORTOGRAM W/LOWER EXTREMITY;  Surgeon: Sheree Penne Bruckner, MD;  Location: [facility] INVASIVE CV LAB;  Service: Cardiovascular;  Laterality: N/A;   ABDOMINAL AORTOGRAM W/LOWER EXTREMITY N/A 12/01/2023   Procedure: ABDOMINAL AORTOGRAM W/LOWER EXTREMITY;  Surgeon: Sheree Penne Bruckner, MD;  Location: [facility] INVASIVE CV LAB;  Service: Cardiovascular;  Laterality: N/A;   AMPUTATION Right 05/09/2020   Procedure: RIGHT BELOW KNEE AMPUTATION;  Surgeon: Sheree Penne Bruckner, MD;  Location: [facility] OR;  Service: Vascular;  Laterality: Right;  w/ a block   COLONOSCOPY WITH PROPOFOL  N/A 02/15/2016   Procedure: COLONOSCOPY WITH PROPOFOL ;  Surgeon: Rogelia Copping, MD;  Location:  [facility];  Service: Endoscopy;  Laterality: N/A;   COLONOSCOPY WITH PROPOFOL  N/A 08/08/2023   Procedure: COLONOSCOPY WITH PROPOFOL ;  Surgeon: Copping Rogelia, MD;  Location: [facility] ENDOSCOPY;  Service: Endoscopy;  Laterality: N/A;   FEMORAL-POPLITEAL BYPASS GRAFT Right 09/14/2019   Procedure: BYPASS GRAFT FEMORAL-POPLITEAL ARTERY;  Surgeon: Sheree Penne Bruckner, MD;  Location: [facility] OR;  Service: Vascular;  Laterality: Right;   HERNIA REPAIR  1999   left inguinal   INSERTION OF ILIAC STENT Right 09/14/2019   Procedure: Insertion Of Common and External Iliac Stent;  Surgeon: Sheree Penne Bruckner, MD;  Location: [facility] OR;  Service: Vascular;  Laterality: Right;   LOWER EXTREMITY ANGIOGRAPHY Right 05/07/2018   Procedure: LOWER EXTREMITY ANGIOGRAPHY;  Surgeon: Marea Selinda RAMAN, MD;  Location: [facility] INVASIVE CV LAB;  Service: Cardiovascular;  Laterality: Right;   LOWER EXTREMITY ANGIOGRAPHY Right 06/25/2018   Procedure: LOWER EXTREMITY ANGIOGRAPHY;  Surgeon: Marea Selinda RAMAN, MD;  Location: [facility] INVASIVE CV LAB;  Service: Cardiovascular;  Laterality: Right;   LOWER EXTREMITY ANGIOGRAPHY Right 07/01/2018   Procedure: LOWER EXTREMITY ANGIOGRAPHY;  Surgeon: Marea Selinda RAMAN, MD;  Location: [facility] INVASIVE CV LAB;  Service: Cardiovascular;  Laterality: Right;   LOWER EXTREMITY ANGIOGRAPHY Right 08/12/2018   Procedure: LOWER EXTREMITY ANGIOGRAPHY;  Surgeon: Marea Selinda RAMAN, MD;  Location: [facility] INVASIVE CV LAB;  Service: Cardiovascular;  Laterality: Right;   LOWER EXTREMITY ANGIOGRAPHY Right 09/03/2018   Procedure: LOWER EXTREMITY ANGIOGRAPHY;  Surgeon: Marea Selinda RAMAN, MD;  Location: [facility] INVASIVE CV LAB;  Service: Cardiovascular;  Laterality: Right;   LOWER  EXTREMITY ANGIOGRAPHY Right 09/04/2018   Procedure: Lower Extremity Angiography;  Surgeon: Marea Selinda RAMAN, MD;  Location: [facility] INVASIVE CV LAB;  Service: Cardiovascular;  Laterality: Right;   LOWER EXTREMITY ANGIOGRAPHY Right 10/01/2018   Procedure: LOWER EXTREMITY  ANGIOGRAPHY;  Surgeon: Marea Selinda RAMAN, MD;  Location: [facility] INVASIVE CV LAB;  Service: Cardiovascular;  Laterality: Right;   LOWER EXTREMITY ANGIOGRAPHY Right 10/01/2018   Procedure: Lower Extremity Angiography;  Surgeon: Marea Selinda RAMAN, MD;  Location: [facility] INVASIVE CV LAB;  Service: Cardiovascular;  Laterality: Right;   LOWER EXTREMITY ANGIOGRAPHY Right 10/15/2018   Procedure: LOWER EXTREMITY ANGIOGRAPHY;  Surgeon: Marea Selinda RAMAN, MD;  Location: [facility] INVASIVE CV LAB;  Service: Cardiovascular;  Laterality: Right;   LOWER EXTREMITY ANGIOGRAPHY Left 10/16/2018   Procedure: Lower Extremity Angiography;  Surgeon: Marea Selinda RAMAN, MD;  Location: [facility] INVASIVE CV LAB;  Service: Cardiovascular;  Laterality: Left;   LOWER EXTREMITY ANGIOGRAPHY Left 01/20/2019   Procedure: LOWER EXTREMITY ANGIOGRAPHY;  Surgeon: Marea Selinda RAMAN, MD;  Location: [facility] INVASIVE CV LAB;  Service: Cardiovascular;  Laterality: Left;   LOWER EXTREMITY ANGIOGRAPHY Right 01/21/2019   Procedure: Lower Extremity Angiography;  Surgeon: Marea Selinda RAMAN, MD;  Location: [facility] INVASIVE CV LAB;  Service: Cardiovascular;  Laterality: Right;   LOWER EXTREMITY ANGIOGRAPHY Right 01/28/2019   Procedure: LOWER EXTREMITY ANGIOGRAPHY;  Surgeon: Marea Selinda RAMAN, MD;  Location: [facility] INVASIVE CV LAB;  Service: Cardiovascular;  Laterality: Right;   LOWER EXTREMITY ANGIOGRAPHY Right 01/29/2019   Procedure: Lower Extremity Angiography;  Surgeon: Marea Selinda RAMAN, MD;  Location: [facility] INVASIVE CV LAB;  Service: Cardiovascular;  Laterality: Right;   LOWER EXTREMITY ANGIOGRAPHY Right 04/04/2020   Procedure: LOWER EXTREMITY ANGIOGRAPHY;  Surgeon: Sheree Penne Bruckner, MD;  Location: [facility] INVASIVE CV LAB;  Service: Cardiovascular;  Laterality: Right;   LOWER EXTREMITY ANGIOGRAPHY N/A 12/01/2023   Procedure: Lower Extremity Angiography;  Surgeon: Sheree Penne Bruckner, MD;  Location: [facility] INVASIVE CV LAB;  Service: Cardiovascular;  Laterality: N/A;   LOWER EXTREMITY INTERVENTION Right  07/02/2018   Procedure: LOWER EXTREMITY INTERVENTION;  Surgeon: Marea Selinda RAMAN, MD;  Location: [facility] INVASIVE CV LAB;  Service: Cardiovascular;  Laterality: Right;   LOWER EXTREMITY INTERVENTION N/A 12/01/2023   Procedure: LOWER EXTREMITY INTERVENTION;  Surgeon: Sheree Penne Bruckner, MD;  Location: [facility] INVASIVE CV LAB;  Service: Cardiovascular;  Laterality: N/A;   PERIPHERAL VASCULAR BALLOON ANGIOPLASTY Left 04/04/2020   Procedure: PERIPHERAL VASCULAR BALLOON ANGIOPLASTY;  Surgeon: Sheree Penne Bruckner, MD;  Location: [facility] INVASIVE CV LAB;  Service: Cardiovascular;  Laterality: Left;  external iliac   POLYPECTOMY N/A 02/15/2016   Procedure: POLYPECTOMY;  Surgeon: Rogelia Copping, MD;  Location: [facility];  Service: Endoscopy;  Laterality: N/A;   POLYPECTOMY  08/08/2023   Procedure: POLYPECTOMY INTESTINAL;  Surgeon: Copping Rogelia, MD;  Location: [facility] ENDOSCOPY;  Service: Endoscopy;;    Social History   Socioeconomic History   Marital status: Single    Spouse name: [name]   Number of children: 5   Years of education: Not on file   Highest education level: High school graduate  Occupational History   Occupation: Forklift    Comment: [employer]  Tobacco Use   Smoking status: Every Day    Current packs/day: 0.00    Average packs/day: 0.5 packs/day for 40.4 years (20.2 ttl pk-yrs)    Types: Cigarettes    Start date: 07/21/1978    Last attempt to quit: 12/2018    Years since quitting: 5.0    Passive exposure: Never  Smokeless tobacco: Never   Tobacco comments:    Started using cocaine recently but states he will try to quit   Vaping Use   Vaping status: Never Used  Substance and Sexual Activity   Alcohol use: Not Currently    Alcohol/week: 12.0 standard drinks of alcohol    Types: 12 Cans of beer per week    Comment: 4 beers / week now per pt 05/05/20   Drug use: Not Currently    Frequency: 2.0 times per week    Types: Cocaine    Comment: sept 2024   Sexual activity: Yes     Partners: Female    Birth control/protection: None  Other Topics Concern   Not on file  Social History Narrative   Not on file   Social Drivers of Health   Financial Resource Strain: Low Risk  (10/17/2023)   Overall Financial Resource Strain (CARDIA)    Difficulty of Paying Living Expenses: Not hard at all  Food Insecurity: No Food Insecurity (10/17/2023)   Hunger Vital Sign    Worried About Running Out of Food in the Last Year: Never true    Ran Out of Food in the Last Year: Never true  Transportation Needs: No Transportation Needs (10/17/2023)   PRAPARE - Administrator, Civil Service (Medical): No    Lack of Transportation (Non-Medical): No  Physical Activity: Sufficiently Active (10/17/2023)   Exercise Vital Sign    Days of Exercise per Week: 3 days    Minutes of Exercise per Session: 60 min  Stress: No Stress Concern Present (10/17/2023)   [employer] of Occupational Health - Occupational Stress Questionnaire    Feeling of Stress : Not at all  Social Connections: Moderately Isolated (10/17/2023)   Social Connection and Isolation Panel    Frequency of Communication with Friends and Family: Three times a week    Frequency of Social Gatherings with Friends and Family: Twice a week    Attends Religious Services: Never    Database administrator or Organizations: No    Attends Banker Meetings: Never    Marital Status: Married  Catering manager Violence: Not At Risk (10/17/2023)   Humiliation, Afraid, Rape, and Kick questionnaire    Fear of Current or Ex-Partner: No    Emotionally Abused: No    Physically Abused: No    Sexually Abused: No    Family History  Problem Relation Age of Onset   COPD Mother    Hypertension Father    Heart attack Brother     Current Outpatient Medications  Medication Sig Dispense Refill   acetaminophen  (TYLENOL ) 650 MG CR tablet Take 1,300 mg by mouth every 8 (eight) hours as needed for pain.     albuterol  (VENTOLIN   HFA) 108 (90 Base) MCG/ACT inhaler Inhale 2 puffs into the lungs every 6 (six) hours as needed for wheezing or shortness of breath. 18 g 0   aspirin  EC 81 MG tablet Take 1 tablet (81 mg total) by mouth daily. 90 tablet 3   atorvastatin  (LIPITOR) 40 MG tablet Take 1 tablet (40 mg total) by mouth daily. 90 tablet 1   clopidogrel  (PLAVIX ) 75 MG tablet Take 1 tablet (75 mg total) by mouth daily. 90 tablet 1   Fluticasone -Umeclidin-Vilant (TRELEGY ELLIPTA ) 100-62.5-25 MCG/ACT AEPB Inhale 1 puff into the lungs daily. 180 each 1   gabapentin  (NEURONTIN ) 100 MG capsule Take 1 capsule (100 mg total) by mouth 3 (three) times daily. 270 capsule 1  tadalafil  (CIALIS ) 20 MG tablet Take 0.5-1 tablets (10-20 mg total) by mouth every other day as needed for erectile dysfunction. 10 tablet 2   Thiamine  HCl (VITAMIN B-1) 250 MG tablet Take 250 mg by mouth every other day.     traZODone  (DESYREL ) 100 MG tablet TAKE 1 TABLET BY MOUTH AT BEDTIME 90 tablet 0   valsartan  (DIOVAN ) 320 MG tablet Take 1 tablet (320 mg total) by mouth daily. BP and kidney disease 90 tablet 1   vitamin B-12 (CYANOCOBALAMIN ) 500 MCG tablet Take 500 mcg by mouth every other day.     No current facility-administered medications for this visit.    Allergies  Allergen Reactions   Levaquin [Levofloxacin]     Avoid quinolones due to dilated aorta    Quinolones     Avoid quinolones due to dilated aorta      REVIEW OF SYSTEMS:   [X]  denotes positive finding, [ ]  denotes negative finding Cardiac  Comments:  Chest pain or chest pressure:    Shortness of breath upon exertion:    Short of breath when lying flat:    Irregular heart rhythm:        Vascular    Pain in calf, thigh, or hip brought on by ambulation:    Pain in feet at night that wakes you up from your sleep:     Blood clot in your veins:    Leg swelling:         Pulmonary    Oxygen at home:    Productive cough:     Wheezing:         Neurologic    Sudden weakness in  arms or legs:     Sudden numbness in arms or legs:     Sudden onset of difficulty speaking or slurred speech:    Temporary loss of vision in one eye:     Problems with dizziness:         Gastrointestinal    Blood in stool:     Vomited blood:         Genitourinary    Burning when urinating:     Blood in urine:        Psychiatric    Major depression:         Hematologic    Bleeding problems:    Problems with blood clotting too easily:        Skin    Rashes or ulcers:        Constitutional    Fever or chills:      PHYSICAL EXAMINATION:  Vitals:   12/31/23 0831  BP: (!) 154/99  Pulse: 69  Temp: 97.9 F (36.6 C)  TempSrc: Temporal  SpO2: 95%  Weight: 143 lb 8 oz (65.1 kg)  Height: 5' 9 (1.753 m)    General:  WDWN in NAD; vital signs documented above Gait: Not observed HENT: WNL, normocephalic Pulmonary: normal non-labored breathing Cardiac: regular HR Abdomen: soft, NT, no masses Skin: without rashes Vascular Exam/Pulses: 2+ L PT, 1+ L DP Extremities: without ischemic changes, without Gangrene , without cellulitis; without open wounds;  Musculoskeletal: no muscle wasting or atrophy  Neurologic: A&O X 3 Psychiatric:  The pt has Normal affect.   Non-Invasive Vascular Imaging:   Left external iliac with 360 cm/s distal to the stent however no disease appreciated  Left SFA widely patent  ABI/TBIToday's ABIToday's TBIPrevious ABIPrevious TBI  +-------+-----------+-----------+------------+------------+  Right BKA        BKA  BKA         BKA           +-------+-----------+-----------+------------+------------+  Left  1.00       0.59       0.67        0.44      ASSESSMENT/PLAN:: 68 y.o. male status post right common iliac stenting, left external iliac stenting, and left SFA stenting  Left leg well-perfused after intervention now with a palpable PT and DP pulse in the foot.  Right groin access site is without hematoma.  Aortoiliac duplex  demonstrates elevated velocities within the external iliac stent and distal to it however given his dramatic increase in ABI and TBI we will treat this as his new baseline.  He will continue his aspirin , Plavix , statin daily.  Encouraged smoking cessation.  Repeat imaging studies in 6 months.   Donnice Sender, PA-C [facility] [phone]  Clinic MD:   Sheree

## 2024-01-01 ENCOUNTER — Other Ambulatory Visit: Payer: Self-pay | Admitting: *Deleted

## 2024-01-01 DIAGNOSIS — T82858A Stenosis of vascular prosthetic devices, implants and grafts, initial encounter: Secondary | ICD-10-CM

## 2024-01-01 DIAGNOSIS — I739 Peripheral vascular disease, unspecified: Secondary | ICD-10-CM

## 2024-01-01 DIAGNOSIS — I70212 Atherosclerosis of native arteries of extremities with intermittent claudication, left leg: Secondary | ICD-10-CM

## 2024-02-24 ENCOUNTER — Telehealth: Payer: Self-pay

## 2024-02-24 NOTE — Telephone Encounter (Signed)
 Copied from CRM [id number]. Topic: Appointments - Appointment Scheduling >> Feb 20, 2024  3:23 PM Sophia H wrote:  Patient states he needs an updated prescription for his prosthetic leg, needs Dr. Glenard to take a look at it to see exactly what he needs. Patient scheduled for October 7th but does not want to wait that long. Please reach out and advise, placed on wait list.

## 2024-02-24 NOTE — Telephone Encounter (Signed)
 Would prefer for him to be seen sooner or can wait till Oct?

## 2024-02-27 ENCOUNTER — Encounter: Payer: Self-pay | Admitting: Family Medicine

## 2024-02-27 ENCOUNTER — Ambulatory Visit (INDEPENDENT_AMBULATORY_CARE_PROVIDER_SITE_OTHER): Admitting: Family Medicine

## 2024-02-27 VITALS — BP 142/94 | HR 86 | Resp 16 | Ht 69.0 in | Wt 141.0 lb

## 2024-02-27 DIAGNOSIS — F1411 Cocaine abuse, in remission: Secondary | ICD-10-CM

## 2024-02-27 DIAGNOSIS — N183 Chronic kidney disease, stage 3 unspecified: Secondary | ICD-10-CM

## 2024-02-27 DIAGNOSIS — Z89511 Acquired absence of right leg below knee: Secondary | ICD-10-CM

## 2024-02-27 DIAGNOSIS — G546 Phantom limb syndrome with pain: Secondary | ICD-10-CM

## 2024-02-27 DIAGNOSIS — I129 Hypertensive chronic kidney disease with stage 1 through stage 4 chronic kidney disease, or unspecified chronic kidney disease: Secondary | ICD-10-CM

## 2024-02-27 DIAGNOSIS — I739 Peripheral vascular disease, unspecified: Secondary | ICD-10-CM

## 2024-02-27 MED ORDER — NEBIVOLOL HCL 2.5 MG PO TABS: 2.5000 mg | ORAL_TABLET | Freq: Every day | ORAL | 1 refills | Status: DC

## 2024-02-27 NOTE — Progress Notes (Signed)
 Name: Ryan [name]   MRN: [id number]    DOB: December 02, 1955   Date:02/27/2024       Progress Note  Subjective  Chief Complaint  Chief Complaint  Patient presents with   Consult    Needs new order for prosthetic leg. Per [facility]- needs new written rx and OV notes to receive.   Discussed the use of AI scribe software for clinical note transcription with the patient, who gave verbal consent to proceed.  History of Present Illness Ryan [name] is a 68 year old male with peripheral arterial disease and a below-knee amputation who presents with issues related to his prosthesis.  He is experiencing issues with his prosthesis due to a change in the size of his stump, which has shrunk since the initial fitting. The prosthesis is not fitting properly, but it remains functional and he uses it regularly. He is seeking a replacement part for the prosthesis to improve the fit.  He underwent a below-knee amputation on the right leg in Nov 2021 due to peripheral arterial disease and lack of blood flow to the leg. He has been managing well with the prosthesis until the recent changes in stump size.  He experiences phantom pain, describing it as feeling his toes. The pain is exacerbated by prolonged walking but does not disturb his sleep. He is currently taking gabapentin  100 mg three times a day for this pain.  He has peripheral arterial disease and is taking atorvastatin  40 mg and aspirin  81 mg for management. He also has hypertension, currently managed with valsartan  320 mg. His blood pressure readings today were elevated at 146/94 and 142/96, which he attributes to smoking a cigarette prior to the visit however bp also elevated in other providers offices throughout the year and did not improve with rest today.  He has a history of cocaine use but has been in early remission .  No current chest pain or palpitations, although he experienced mild chest pain a couple of months ago that resolved on its  own.  He is currently smoking cigarettes, which he acknowledges may affect his blood pressure.    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   History of colonic polyps 08/08/2023   Polyp of ascending colon 08/08/2023   Cocaine use disorder, mild, in early remission (HCC) 08/27/2022   History of alcoholism (HCC) 01/24/2022   History of amputation below knee, right (HCC) 01/24/2022   Moderate protein malnutrition (HCC) 01/24/2022   Benign hypertension with chronic kidney disease, stage III (HCC) 01/24/2022   Aneurysm of ascending aorta without rupture (HCC) 01/24/2022   Stage 3a chronic kidney disease (HCC) 01/24/2022   Phantom pain after amputation of lower extremity (HCC)    Below-knee amputation of right lower extremity (HCC) 05/17/2020   PAD (peripheral artery disease) (HCC) 07/21/2018   B12 deficiency 03/12/2018   Vitamin B1 deficiency 03/12/2018   Varicose veins of leg with pain, right 03/12/2018   Enlarged thoracic aorta (HCC) 02/24/2018   Paresthesia 02/24/2018   Emphysema lung (HCC) 02/20/2018   Atherosclerosis of aorta (HCC) 02/20/2018   Hypertension, benign 08/31/2015   Tobacco use 08/31/2015    Past Surgical History:  Procedure Laterality Date   ABDOMINAL AORTOGRAM W/LOWER EXTREMITY N/A 09/13/2019   Procedure: ABDOMINAL AORTOGRAM W/LOWER EXTREMITY;  Surgeon: Sheree Penne Bruckner, MD;  Location: [facility] INVASIVE CV LAB;  Service: Cardiovascular;  Laterality: N/A;   ABDOMINAL AORTOGRAM W/LOWER EXTREMITY N/A 12/01/2023  Procedure: ABDOMINAL AORTOGRAM W/LOWER EXTREMITY;  Surgeon: Sheree Penne Bruckner, MD;  Location: [facility] INVASIVE CV LAB;  Service: Cardiovascular;  Laterality: N/A;   AMPUTATION Right 05/09/2020   Procedure: RIGHT BELOW KNEE AMPUTATION;  Surgeon: Sheree Penne Bruckner, MD;  Location: [facility] OR;  Service: Vascular;  Laterality: Right;  w/ a block   COLONOSCOPY WITH PROPOFOL  N/A 02/15/2016    Procedure: COLONOSCOPY WITH PROPOFOL ;  Surgeon: Rogelia Copping, MD;  Location: [facility];  Service: Endoscopy;  Laterality: N/A;   COLONOSCOPY WITH PROPOFOL  N/A 08/08/2023   Procedure: COLONOSCOPY WITH PROPOFOL ;  Surgeon: Copping Rogelia, MD;  Location: [facility] ENDOSCOPY;  Service: Endoscopy;  Laterality: N/A;   FEMORAL-POPLITEAL BYPASS GRAFT Right 09/14/2019   Procedure: BYPASS GRAFT FEMORAL-POPLITEAL ARTERY;  Surgeon: Sheree Penne Bruckner, MD;  Location: [facility] OR;  Service: Vascular;  Laterality: Right;   HERNIA REPAIR  1999   left inguinal   INSERTION OF ILIAC STENT Right 09/14/2019   Procedure: Insertion Of Common and External Iliac Stent;  Surgeon: Sheree Penne Bruckner, MD;  Location: [facility] OR;  Service: Vascular;  Laterality: Right;   LOWER EXTREMITY ANGIOGRAPHY Right 05/07/2018   Procedure: LOWER EXTREMITY ANGIOGRAPHY;  Surgeon: Marea Selinda RAMAN, MD;  Location: [facility] INVASIVE CV LAB;  Service: Cardiovascular;  Laterality: Right;   LOWER EXTREMITY ANGIOGRAPHY Right 06/25/2018   Procedure: LOWER EXTREMITY ANGIOGRAPHY;  Surgeon: Marea Selinda RAMAN, MD;  Location: [facility] INVASIVE CV LAB;  Service: Cardiovascular;  Laterality: Right;   LOWER EXTREMITY ANGIOGRAPHY Right 07/01/2018   Procedure: LOWER EXTREMITY ANGIOGRAPHY;  Surgeon: Marea Selinda RAMAN, MD;  Location: [facility] INVASIVE CV LAB;  Service: Cardiovascular;  Laterality: Right;   LOWER EXTREMITY ANGIOGRAPHY Right 08/12/2018   Procedure: LOWER EXTREMITY ANGIOGRAPHY;  Surgeon: Marea Selinda RAMAN, MD;  Location: [facility] INVASIVE CV LAB;  Service: Cardiovascular;  Laterality: Right;   LOWER EXTREMITY ANGIOGRAPHY Right 09/03/2018   Procedure: LOWER EXTREMITY ANGIOGRAPHY;  Surgeon: Marea Selinda RAMAN, MD;  Location: [facility] INVASIVE CV LAB;  Service: Cardiovascular;  Laterality: Right;   LOWER EXTREMITY ANGIOGRAPHY Right 09/04/2018   Procedure: Lower Extremity Angiography;  Surgeon: Marea Selinda RAMAN, MD;  Location: [facility] INVASIVE CV LAB;  Service: Cardiovascular;  Laterality: Right;   LOWER  EXTREMITY ANGIOGRAPHY Right 10/01/2018   Procedure: LOWER EXTREMITY ANGIOGRAPHY;  Surgeon: Marea Selinda RAMAN, MD;  Location: [facility] INVASIVE CV LAB;  Service: Cardiovascular;  Laterality: Right;   LOWER EXTREMITY ANGIOGRAPHY Right 10/01/2018   Procedure: Lower Extremity Angiography;  Surgeon: Marea Selinda RAMAN, MD;  Location: [facility] INVASIVE CV LAB;  Service: Cardiovascular;  Laterality: Right;   LOWER EXTREMITY ANGIOGRAPHY Right 10/15/2018   Procedure: LOWER EXTREMITY ANGIOGRAPHY;  Surgeon: Marea Selinda RAMAN, MD;  Location: [facility] INVASIVE CV LAB;  Service: Cardiovascular;  Laterality: Right;   LOWER EXTREMITY ANGIOGRAPHY Left 10/16/2018   Procedure: Lower Extremity Angiography;  Surgeon: Marea Selinda RAMAN, MD;  Location: [facility] INVASIVE CV LAB;  Service: Cardiovascular;  Laterality: Left;   LOWER EXTREMITY ANGIOGRAPHY Left 01/20/2019   Procedure: LOWER EXTREMITY ANGIOGRAPHY;  Surgeon: Marea Selinda RAMAN, MD;  Location: [facility] INVASIVE CV LAB;  Service: Cardiovascular;  Laterality: Left;   LOWER EXTREMITY ANGIOGRAPHY Right 01/21/2019   Procedure: Lower Extremity Angiography;  Surgeon: Marea Selinda RAMAN, MD;  Location: [facility] INVASIVE CV LAB;  Service: Cardiovascular;  Laterality: Right;   LOWER EXTREMITY ANGIOGRAPHY Right 01/28/2019   Procedure: LOWER EXTREMITY ANGIOGRAPHY;  Surgeon: Marea Selinda RAMAN, MD;  Location: [facility] INVASIVE CV LAB;  Service: Cardiovascular;  Laterality: Right;   LOWER EXTREMITY ANGIOGRAPHY Right 01/29/2019  Procedure: Lower Extremity Angiography;  Surgeon: Marea Selinda RAMAN, MD;  Location: [facility] INVASIVE CV LAB;  Service: Cardiovascular;  Laterality: Right;   LOWER EXTREMITY ANGIOGRAPHY Right 04/04/2020   Procedure: LOWER EXTREMITY ANGIOGRAPHY;  Surgeon: Sheree Penne Bruckner, MD;  Location: [facility] INVASIVE CV LAB;  Service: Cardiovascular;  Laterality: Right;   LOWER EXTREMITY ANGIOGRAPHY N/A 12/01/2023   Procedure: Lower Extremity Angiography;  Surgeon: Sheree Penne Bruckner, MD;  Location: [facility] INVASIVE CV LAB;  Service:  Cardiovascular;  Laterality: N/A;   LOWER EXTREMITY INTERVENTION Right 07/02/2018   Procedure: LOWER EXTREMITY INTERVENTION;  Surgeon: Marea Selinda RAMAN, MD;  Location: [facility] INVASIVE CV LAB;  Service: Cardiovascular;  Laterality: Right;   LOWER EXTREMITY INTERVENTION N/A 12/01/2023   Procedure: LOWER EXTREMITY INTERVENTION;  Surgeon: Sheree Penne Bruckner, MD;  Location: [facility] INVASIVE CV LAB;  Service: Cardiovascular;  Laterality: N/A;   PERIPHERAL VASCULAR BALLOON ANGIOPLASTY Left 04/04/2020   Procedure: PERIPHERAL VASCULAR BALLOON ANGIOPLASTY;  Surgeon: Sheree Penne Bruckner, MD;  Location: [facility] INVASIVE CV LAB;  Service: Cardiovascular;  Laterality: Left;  external iliac   POLYPECTOMY N/A 02/15/2016   Procedure: POLYPECTOMY;  Surgeon: Rogelia Copping, MD;  Location: [facility];  Service: Endoscopy;  Laterality: N/A;   POLYPECTOMY  08/08/2023   Procedure: POLYPECTOMY INTESTINAL;  Surgeon: Copping Rogelia, MD;  Location: [facility] ENDOSCOPY;  Service: Endoscopy;;    Family History  Problem Relation Age of Onset   COPD Mother    Hypertension Father    Heart attack Brother     Social History   Tobacco Use   Smoking status: Every Day    Current packs/day: 0.00    Average packs/day: 0.5 packs/day for 40.4 years (20.2 ttl pk-yrs)    Types: Cigarettes    Start date: 07/21/1978    Last attempt to quit: 12/2018    Years since quitting: 5.1    Passive exposure: Never   Smokeless tobacco: Never   Tobacco comments:    Started using cocaine recently but states he will try to quit   Substance Use Topics   Alcohol use: Not Currently    Alcohol/week: 12.0 standard drinks of alcohol    Types: 12 Cans of beer per week    Comment: 4 beers / week now per pt 05/05/20     Current Outpatient Medications:    acetaminophen  (TYLENOL ) 650 MG CR tablet, Take 1,300 mg by mouth every 8 (eight) hours as needed for pain., Disp: , Rfl:    albuterol  (VENTOLIN  HFA) 108 (90 Base) MCG/ACT inhaler, Inhale 2 puffs into  the lungs every 6 (six) hours as needed for wheezing or shortness of breath., Disp: 18 g, Rfl: 0   aspirin  EC 81 MG tablet, Take 1 tablet (81 mg total) by mouth daily., Disp: 90 tablet, Rfl: 3   atorvastatin  (LIPITOR) 40 MG tablet, Take 1 tablet (40 mg total) by mouth daily., Disp: 90 tablet, Rfl: 1   clopidogrel  (PLAVIX ) 75 MG tablet, Take 1 tablet (75 mg total) by mouth daily., Disp: 90 tablet, Rfl: 1   Fluticasone -Umeclidin-Vilant (TRELEGY ELLIPTA ) 100-62.5-25 MCG/ACT AEPB, Inhale 1 puff into the lungs daily., Disp: 180 each, Rfl: 1   gabapentin  (NEURONTIN ) 100 MG capsule, Take 1 capsule (100 mg total) by mouth 3 (three) times daily., Disp: 270 capsule, Rfl: 1   nebivolol  (BYSTOLIC ) 2.5 MG tablet, Take 1 tablet (2.5 mg total) by mouth daily., Disp: 30 tablet, Rfl: 1   tadalafil  (CIALIS ) 20 MG tablet, Take 0.5-1 tablets (10-20 mg total) by mouth every other  day as needed for erectile dysfunction., Disp: 10 tablet, Rfl: 2   Thiamine  HCl (VITAMIN B-1) 250 MG tablet, Take 250 mg by mouth every other day., Disp: , Rfl:    traZODone  (DESYREL ) 100 MG tablet, TAKE 1 TABLET BY MOUTH AT BEDTIME, Disp: 90 tablet, Rfl: 0   valsartan  (DIOVAN ) 320 MG tablet, Take 1 tablet (320 mg total) by mouth daily. BP and kidney disease, Disp: 90 tablet, Rfl: 1   vitamin B-12 (CYANOCOBALAMIN ) 500 MCG tablet, Take 500 mcg by mouth every other day., Disp: , Rfl:   Allergies  Allergen Reactions   Levaquin [Levofloxacin]     Avoid quinolones due to dilated aorta    Quinolones     Avoid quinolones due to dilated aorta     I personally reviewed active problem list, medication list, allergies with the patient/caregiver today.   ROS  Ten systems reviewed and is negative except as mentioned in HPI    Objective Physical Exam  CONSTITUTIONAL: Patient appears well-developed and well-nourished.  No distress. HEENT: Head atraumatic, normocephalic, neck supple. CARDIOVASCULAR: Normal rate, regular rhythm and normal heart  sounds.  No murmur heard. No BLE edema. PULMONARY: Effort normal and breath sounds normal. No respiratory distress. ABDOMINAL: There is no tenderness or distention. MUSCULOSKELETAL: below right knee amputation PSYCHIATRIC: Patient has a normal mood and affect. behavior is normal. Judgment and thought content normal.  Vitals:   02/27/24 1321 02/27/24 1323 02/27/24 1335  BP:  (!) 146/94 (!) 142/96  Pulse:  86   Resp: 16 16   SpO2:  98%   Weight:  141 lb (64 kg)   Height: 5' 9 (1.753 m) 5' 9 (1.753 m)     Body mass index is 20.82 kg/m.  Recent Results (from the past 2160 hours)  I-STAT, chem 8     Status: Abnormal   Collection Time: 12/01/23  5:55 AM  Result Value Ref Range   Sodium 141 135 - 145 mmol/L   Potassium 3.6 3.5 - 5.1 mmol/L   Chloride 107 98 - 111 mmol/L   BUN 21 8 - 23 mg/dL   Creatinine, Ser 8.19 (H) 0.61 - 1.24 mg/dL   Glucose, Bld 68 (L) 70 - 99 mg/dL    Comment: Glucose reference range applies only to samples taken after fasting for at least 8 hours.   Calcium , Ion 1.23 1.15 - 1.40 mmol/L   TCO2 21 (L) 22 - 32 mmol/L   Hemoglobin 13.3 13.0 - 17.0 g/dL   HCT 60.9 60.9 - 47.9 %  VAS US  ABI WITH/WO TBI     Status: None   Collection Time: 12/31/23 12:58 PM  Result Value Ref Range   Right ABI BKA    Left ABI 1.00     Diabetic Foot Exam:     PHQ2/9:    12/24/2023   12:55 PM 10/17/2023   11:45 AM 09/15/2023    2:00 PM 07/15/2023    9:04 AM 04/11/2023   11:20 AM  Depression screen PHQ 2/9  Decreased Interest 0 0 0 0 0  Down, Depressed, Hopeless 0 0 0 0 0  PHQ - 2 Score 0 0 0 0 0  Altered sleeping  0 0 0 0  Tired, decreased energy  0 0 0 0  Change in appetite  0 0 0 0  Feeling bad or failure about yourself   0 0 0 0  Trouble concentrating  0 0 0 0  Moving slowly or fidgety/restless  0 0 0 0  Suicidal thoughts  0 0 0 0  PHQ-9 Score  0 0 0 0  Difficult doing work/chores  Not difficult at all Not difficult at all Not difficult at all     phq 9 is  negative  Fall Risk:    12/24/2023   12:55 PM 10/17/2023   11:44 AM 10/15/2023   10:33 AM 07/15/2023    9:04 AM 04/11/2023   11:20 AM  Fall Risk   Falls in the past year? 0 0 0 0 0  Number falls in past yr: 0 0 0 0   Injury with Fall? 0 0  0   Risk for fall due to : No Fall Risks No Fall Risks No Fall Risks No Fall Risks No Fall Risks  Follow up Falls prevention discussed;Falls evaluation completed;Education provided Falls prevention discussed;Falls evaluation completed Falls prevention discussed;Education provided;Falls evaluation completed Falls prevention discussed;Education provided;Falls evaluation completed Falls prevention discussed     Assessment & Plan Acquired absence of right leg below knee with poorly fitting prosthesis/ History of Above knee amputation  Prosthesis poorly fitting due to stump shrinkage since initial fitting. - Fax prescription for prosthetic adjustment to [facility].  Peripheral arterial disease of lower extremities Managed with atorvastatin  and aspirin . No current issues with left leg post-procedure; ultrasound showed good results.  Phantom limb pain Phantom limb pain managed with gabapentin  100 mg TID. Pain does not disturb sleep. Considered increasing nighttime dose but deferred to avoid disrupting prescription cycle. - Continue gabapentin  100 mg TID. - Reassess gabapentin  dosage at next appointment.  Hypertension with stage 3 chronic kidney disease Elevated blood pressure today, possibly due to recent smoking. Currently on valsartan  320 mg. Considering beta blocker addition for hypertension and cardiac protection due to vascular disease. Chronic kidney disease stage 3A noted. - Prescribe Bystolic  2.5 mg (beta blocker) 30-day supply to assess tolerance. - Recheck blood pressure - Send prescription to [facility].  Cocaine use disorder, in early remission We will continue to monitor

## 2024-03-20 ENCOUNTER — Emergency Department

## 2024-03-20 ENCOUNTER — Emergency Department
Admission: EM | Admit: 2024-03-20 | Discharge: 2024-03-20 | Disposition: A | Discharge status: Home / Self Care | Attending: Emergency Medicine | Admitting: Emergency Medicine

## 2024-03-20 DIAGNOSIS — M47812 Spondylosis without myelopathy or radiculopathy, cervical region: Secondary | ICD-10-CM | POA: Diagnosis not present

## 2024-03-20 DIAGNOSIS — N189 Chronic kidney disease, unspecified: Secondary | ICD-10-CM | POA: Diagnosis not present

## 2024-03-20 DIAGNOSIS — S0990XA Unspecified injury of head, initial encounter: Secondary | ICD-10-CM | POA: Diagnosis present

## 2024-03-20 DIAGNOSIS — I129 Hypertensive chronic kidney disease with stage 1 through stage 4 chronic kidney disease, or unspecified chronic kidney disease: Secondary | ICD-10-CM | POA: Diagnosis not present

## 2024-03-20 DIAGNOSIS — E785 Hyperlipidemia, unspecified: Secondary | ICD-10-CM | POA: Insufficient documentation

## 2024-03-20 DIAGNOSIS — Y92410 Unspecified street and highway as the place of occurrence of the external cause: Secondary | ICD-10-CM | POA: Diagnosis not present

## 2024-03-20 DIAGNOSIS — M542 Cervicalgia: Secondary | ICD-10-CM | POA: Diagnosis present

## 2024-03-20 DIAGNOSIS — J439 Emphysema, unspecified: Secondary | ICD-10-CM | POA: Diagnosis not present

## 2024-03-20 DIAGNOSIS — S161XXA Strain of muscle, fascia and tendon at neck level, initial encounter: Secondary | ICD-10-CM

## 2024-03-20 MED ORDER — ACETAMINOPHEN 500 MG PO TABS
1000.0000 mg | ORAL_TABLET | Freq: Once | ORAL | Status: AC
  Administered 2024-03-20: 1000 mg via ORAL
  Filled 2024-03-20: qty 2
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
## 2024-03-23 ENCOUNTER — Ambulatory Visit (INDEPENDENT_AMBULATORY_CARE_PROVIDER_SITE_OTHER): Payer: Self-pay | Admitting: Family Medicine

## 2024-03-23 ENCOUNTER — Encounter: Payer: Self-pay | Admitting: Family Medicine

## 2024-03-23 DIAGNOSIS — M542 Cervicalgia: Secondary | ICD-10-CM

## 2024-03-23 DIAGNOSIS — M62838 Other muscle spasm: Secondary | ICD-10-CM

## 2024-03-23 MED ORDER — LIDOCAINE 5 % EX PTCH: 1.0000 | MEDICATED_PATCH | CUTANEOUS | 0 refills | Status: DC

## 2024-03-23 MED ORDER — BACLOFEN 10 MG PO TABS: 10.0000 mg | ORAL_TABLET | Freq: Three times a day (TID) | ORAL | 0 refills | Status: DC
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
## 2024-03-24 ENCOUNTER — Telehealth: Payer: Self-pay | Admitting: Pharmacy Technician

## 2024-03-24 ENCOUNTER — Other Ambulatory Visit (HOSPITAL_COMMUNITY): Payer: Self-pay

## 2024-03-24 NOTE — Telephone Encounter (Signed)
 Pharmacy Patient Advocate Encounter  Received notification from [facility] that Prior Authorization for Lidocaine  5% patches has been DENIED.  Full denial letter will be uploaded to the media tab. See denial reason below.   PA #/Case ID/Reference #: [id number]

## 2024-03-24 NOTE — Telephone Encounter (Signed)
 This is not our patient.  KP

## 2024-03-24 NOTE — Telephone Encounter (Signed)
 Pharmacy Patient Advocate Encounter   Received notification from Onbase that prior authorization for Lidocaine  5% patches is required/requested.   Insurance verification completed.   The patient is insured through [employer].   Per test claim: PA required; PA submitted to above mentioned insurance via Latent Key/confirmation #/EOC [facility] Status is pending

## 2024-03-28 ENCOUNTER — Other Ambulatory Visit: Payer: Self-pay | Admitting: Family Medicine

## 2024-03-28 DIAGNOSIS — G4709 Other insomnia: Secondary | ICD-10-CM

## 2024-03-29 NOTE — Telephone Encounter (Signed)
 Pt following up on 04/15/24

## 2024-03-30 ENCOUNTER — Other Ambulatory Visit: Payer: Self-pay | Admitting: Family Medicine

## 2024-03-30 ENCOUNTER — Ambulatory Visit: Admitting: Family Medicine

## 2024-03-30 DIAGNOSIS — N529 Male erectile dysfunction, unspecified: Secondary | ICD-10-CM

## 2024-04-15 ENCOUNTER — Ambulatory Visit (INDEPENDENT_AMBULATORY_CARE_PROVIDER_SITE_OTHER): Admitting: Family Medicine

## 2024-04-15 ENCOUNTER — Encounter: Payer: Self-pay | Admitting: Family Medicine

## 2024-04-15 VITALS — BP 136/76 | HR 89 | Resp 16 | Ht 69.0 in | Wt 141.8 lb

## 2024-04-15 DIAGNOSIS — J432 Centrilobular emphysema: Secondary | ICD-10-CM | POA: Diagnosis not present

## 2024-04-15 DIAGNOSIS — Z23 Encounter for immunization: Secondary | ICD-10-CM | POA: Diagnosis not present

## 2024-04-15 DIAGNOSIS — I700 Atherosclerosis of aorta: Secondary | ICD-10-CM

## 2024-04-15 DIAGNOSIS — N1831 Chronic kidney disease, stage 3a: Secondary | ICD-10-CM | POA: Diagnosis not present

## 2024-04-15 DIAGNOSIS — F1021 Alcohol dependence, in remission: Secondary | ICD-10-CM

## 2024-04-15 DIAGNOSIS — I129 Hypertensive chronic kidney disease with stage 1 through stage 4 chronic kidney disease, or unspecified chronic kidney disease: Secondary | ICD-10-CM | POA: Diagnosis not present

## 2024-04-15 DIAGNOSIS — G546 Phantom limb syndrome with pain: Secondary | ICD-10-CM

## 2024-04-15 DIAGNOSIS — K551 Chronic vascular disorders of intestine: Secondary | ICD-10-CM

## 2024-04-15 DIAGNOSIS — Z89511 Acquired absence of right leg below knee: Secondary | ICD-10-CM

## 2024-04-15 DIAGNOSIS — G4709 Other insomnia: Secondary | ICD-10-CM

## 2024-04-15 DIAGNOSIS — N183 Chronic kidney disease, stage 3 unspecified: Secondary | ICD-10-CM

## 2024-04-15 DIAGNOSIS — F1411 Cocaine abuse, in remission: Secondary | ICD-10-CM

## 2024-04-15 DIAGNOSIS — Z122 Encounter for screening for malignant neoplasm of respiratory organs: Secondary | ICD-10-CM

## 2024-04-15 DIAGNOSIS — I739 Peripheral vascular disease, unspecified: Secondary | ICD-10-CM

## 2024-04-15 MED ORDER — ALBUTEROL SULFATE HFA 108 (90 BASE) MCG/ACT IN AERS: 2.0000 | INHALATION_SPRAY | Freq: Four times a day (QID) | RESPIRATORY_TRACT | 0 refills | Status: DC | PRN

## 2024-04-15 MED ORDER — GABAPENTIN 100 MG PO CAPS: 100.0000 mg | ORAL_CAPSULE | Freq: Four times a day (QID) | ORAL | 1 refills | Status: AC

## 2024-04-15 MED ORDER — CLOPIDOGREL BISULFATE 75 MG PO TABS: 75.0000 mg | ORAL_TABLET | Freq: Every day | ORAL | 1 refills | Status: AC

## 2024-04-15 MED ORDER — NEBIVOLOL HCL 5 MG PO TABS: 5.0000 mg | ORAL_TABLET | Freq: Every day | ORAL | 1 refills | Status: AC

## 2024-04-15 MED ORDER — ATORVASTATIN CALCIUM 40 MG PO TABS: 40.0000 mg | ORAL_TABLET | Freq: Every day | ORAL | 1 refills | Status: AC

## 2024-04-15 MED ORDER — VALSARTAN 320 MG PO TABS: 320.0000 mg | ORAL_TABLET | Freq: Every day | ORAL | 1 refills | Status: AC

## 2024-04-15 NOTE — Progress Notes (Signed)
 Name: Ryan [name]   MRN: [id number]    DOB: June 10, 1956   Date:04/15/2024       Progress Note  Subjective  Chief Complaint  Chief Complaint  Patient presents with   Medical Management of Chronic Issues   Discussed the use of AI scribe software for clinical note transcription with the patient, who gave verbal consent to proceed.  History of Present Illness Ryan [name] is a 68 year old male with peripheral artery disease who presents for a routine follow-up visit.  He has a history of peripheral artery disease with a right below-knee amputation. Recently, he underwent a procedure on his arterial stent. He is on Plavix , a beta blocker, atorvastatin , and a baby aspirin . His prosthesis is being replaced because his stump has shrunk, and he experiences worsening phantom pain in his right leg, for which he takes gabapentin .  He has chronic kidney disease stage 3A, with a recent eGFR of 51, improved from 31 in May 2023. He is on valsartan  320 mg and Bystolic  2.5 mg and has not been checking his blood pressure at home recently.  He has COPD and experiences wheezing at night and sometimes during the day. He uses Trelegy and finds albuterol  helpful for acute symptoms. He has a history of smoking and is currently trying to quit.  He has a history of alcohol use, drinking wine two to three times a week, and reports a past issue with cocaine use, which he has been abstinent from for about a year. He takes B12 and B1 over the counter. He also takes trazodone  for sleep, sometimes taking half a dose when also taking gabapentin .    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/01/2023   History of colonic polyps 08/08/2023   Polyp of ascending colon 08/08/2023   Cocaine use disorder, mild, in early remission (HCC) 08/27/2022   History of alcoholism (HCC) 01/24/2022   History of amputation below knee, right (HCC) 01/24/2022   Moderate protein  malnutrition 01/24/2022   Benign hypertension with chronic kidney disease, stage III (HCC) 01/24/2022   Aneurysm of ascending aorta without rupture 01/24/2022   Stage 3a chronic kidney disease (HCC) 01/24/2022   Phantom pain after amputation of lower extremity (HCC)    Below-knee amputation of right lower extremity (HCC) 05/17/2020   PAD (peripheral artery disease) 07/21/2018   B12 deficiency 03/12/2018   Vitamin B1 deficiency 03/12/2018   Varicose veins of leg with pain, right 03/12/2018   Enlarged thoracic aorta 02/24/2018   Paresthesia 02/24/2018   Emphysema lung (HCC) 02/20/2018   Atherosclerosis of aorta 02/20/2018   Hypertension, benign 08/31/2015   Tobacco use 08/31/2015    Past Surgical History:  Procedure Laterality Date   ABDOMINAL AORTOGRAM W/LOWER EXTREMITY N/A 09/13/2019   Procedure: ABDOMINAL AORTOGRAM W/LOWER EXTREMITY;  Surgeon: Sheree Penne Bruckner, MD;  Location: [facility] INVASIVE CV LAB;  Service: Cardiovascular;  Laterality: N/A;   ABDOMINAL AORTOGRAM W/LOWER EXTREMITY N/A 12/01/2023   Procedure: ABDOMINAL AORTOGRAM W/LOWER EXTREMITY;  Surgeon: Sheree Penne Bruckner, MD;  Location: [facility] INVASIVE CV LAB;  Service: Cardiovascular;  Laterality: N/A;   AMPUTATION Right 05/09/2020   Procedure: RIGHT BELOW KNEE AMPUTATION;  Surgeon: Sheree Penne Bruckner, MD;  Location: [facility] OR;  Service: Vascular;  Laterality: Right;  w/ a block   COLONOSCOPY WITH PROPOFOL  N/A 02/15/2016   Procedure: COLONOSCOPY WITH PROPOFOL ;  Surgeon: Rogelia Copping, MD;  Location: [facility] SURGERY CNTR;  Service: Endoscopy;  Laterality: N/A;  COLONOSCOPY WITH PROPOFOL  N/A 08/08/2023   Procedure: COLONOSCOPY WITH PROPOFOL ;  Surgeon: Jinny Carmine, MD;  Location: [facility] ENDOSCOPY;  Service: Endoscopy;  Laterality: N/A;   FEMORAL-POPLITEAL BYPASS GRAFT Right 09/14/2019   Procedure: BYPASS GRAFT FEMORAL-POPLITEAL ARTERY;  Surgeon: Sheree Penne Bruckner, MD;  Location: [facility] OR;  Service: Vascular;  Laterality:  Right;   HERNIA REPAIR  1999   left inguinal   INSERTION OF ILIAC STENT Right 09/14/2019   Procedure: Insertion Of Common and External Iliac Stent;  Surgeon: Sheree Penne Bruckner, MD;  Location: [facility] OR;  Service: Vascular;  Laterality: Right;   LOWER EXTREMITY ANGIOGRAPHY Right 05/07/2018   Procedure: LOWER EXTREMITY ANGIOGRAPHY;  Surgeon: Marea Selinda RAMAN, MD;  Location: [facility] INVASIVE CV LAB;  Service: Cardiovascular;  Laterality: Right;   LOWER EXTREMITY ANGIOGRAPHY Right 06/25/2018   Procedure: LOWER EXTREMITY ANGIOGRAPHY;  Surgeon: Marea Selinda RAMAN, MD;  Location: [facility] INVASIVE CV LAB;  Service: Cardiovascular;  Laterality: Right;   LOWER EXTREMITY ANGIOGRAPHY Right 07/01/2018   Procedure: LOWER EXTREMITY ANGIOGRAPHY;  Surgeon: Marea Selinda RAMAN, MD;  Location: [facility] INVASIVE CV LAB;  Service: Cardiovascular;  Laterality: Right;   LOWER EXTREMITY ANGIOGRAPHY Right 08/12/2018   Procedure: LOWER EXTREMITY ANGIOGRAPHY;  Surgeon: Marea Selinda RAMAN, MD;  Location: [facility] INVASIVE CV LAB;  Service: Cardiovascular;  Laterality: Right;   LOWER EXTREMITY ANGIOGRAPHY Right 09/03/2018   Procedure: LOWER EXTREMITY ANGIOGRAPHY;  Surgeon: Marea Selinda RAMAN, MD;  Location: [facility] INVASIVE CV LAB;  Service: Cardiovascular;  Laterality: Right;   LOWER EXTREMITY ANGIOGRAPHY Right 09/04/2018   Procedure: Lower Extremity Angiography;  Surgeon: Marea Selinda RAMAN, MD;  Location: [facility] INVASIVE CV LAB;  Service: Cardiovascular;  Laterality: Right;   LOWER EXTREMITY ANGIOGRAPHY Right 10/01/2018   Procedure: LOWER EXTREMITY ANGIOGRAPHY;  Surgeon: Marea Selinda RAMAN, MD;  Location: [facility] INVASIVE CV LAB;  Service: Cardiovascular;  Laterality: Right;   LOWER EXTREMITY ANGIOGRAPHY Right 10/01/2018   Procedure: Lower Extremity Angiography;  Surgeon: Marea Selinda RAMAN, MD;  Location: [facility] INVASIVE CV LAB;  Service: Cardiovascular;  Laterality: Right;   LOWER EXTREMITY ANGIOGRAPHY Right 10/15/2018   Procedure: LOWER EXTREMITY ANGIOGRAPHY;  Surgeon: Marea Selinda RAMAN, MD;   Location: [facility] INVASIVE CV LAB;  Service: Cardiovascular;  Laterality: Right;   LOWER EXTREMITY ANGIOGRAPHY Left 10/16/2018   Procedure: Lower Extremity Angiography;  Surgeon: Marea Selinda RAMAN, MD;  Location: [facility] INVASIVE CV LAB;  Service: Cardiovascular;  Laterality: Left;   LOWER EXTREMITY ANGIOGRAPHY Left 01/20/2019   Procedure: LOWER EXTREMITY ANGIOGRAPHY;  Surgeon: Marea Selinda RAMAN, MD;  Location: [facility] INVASIVE CV LAB;  Service: Cardiovascular;  Laterality: Left;   LOWER EXTREMITY ANGIOGRAPHY Right 01/21/2019   Procedure: Lower Extremity Angiography;  Surgeon: Marea Selinda RAMAN, MD;  Location: [facility] INVASIVE CV LAB;  Service: Cardiovascular;  Laterality: Right;   LOWER EXTREMITY ANGIOGRAPHY Right 01/28/2019   Procedure: LOWER EXTREMITY ANGIOGRAPHY;  Surgeon: Marea Selinda RAMAN, MD;  Location: [facility] INVASIVE CV LAB;  Service: Cardiovascular;  Laterality: Right;   LOWER EXTREMITY ANGIOGRAPHY Right 01/29/2019   Procedure: Lower Extremity Angiography;  Surgeon: Marea Selinda RAMAN, MD;  Location: [facility] INVASIVE CV LAB;  Service: Cardiovascular;  Laterality: Right;   LOWER EXTREMITY ANGIOGRAPHY Right 04/04/2020   Procedure: LOWER EXTREMITY ANGIOGRAPHY;  Surgeon: Sheree Penne Bruckner, MD;  Location: [facility] INVASIVE CV LAB;  Service: Cardiovascular;  Laterality: Right;   LOWER EXTREMITY ANGIOGRAPHY N/A 12/01/2023   Procedure: Lower Extremity Angiography;  Surgeon: Sheree Penne Bruckner, MD;  Location: [facility] INVASIVE CV LAB;  Service: Cardiovascular;  Laterality: N/A;  LOWER EXTREMITY INTERVENTION Right 07/02/2018   Procedure: LOWER EXTREMITY INTERVENTION;  Surgeon: Marea Selinda RAMAN, MD;  Location: [facility] INVASIVE CV LAB;  Service: Cardiovascular;  Laterality: Right;   LOWER EXTREMITY INTERVENTION N/A 12/01/2023   Procedure: LOWER EXTREMITY INTERVENTION;  Surgeon: Sheree Penne Bruckner, MD;  Location: [facility] INVASIVE CV LAB;  Service: Cardiovascular;  Laterality: N/A;   PERIPHERAL VASCULAR BALLOON ANGIOPLASTY Left 04/04/2020   Procedure:  PERIPHERAL VASCULAR BALLOON ANGIOPLASTY;  Surgeon: Sheree Penne Bruckner, MD;  Location: [facility] INVASIVE CV LAB;  Service: Cardiovascular;  Laterality: Left;  external iliac   POLYPECTOMY N/A 02/15/2016   Procedure: POLYPECTOMY;  Surgeon: Rogelia Copping, MD;  Location: [facility] SURGERY CNTR;  Service: Endoscopy;  Laterality: N/A;   POLYPECTOMY  08/08/2023   Procedure: POLYPECTOMY INTESTINAL;  Surgeon: Copping Rogelia, MD;  Location: [facility] ENDOSCOPY;  Service: Endoscopy;;    Family History  Problem Relation Age of Onset   COPD Mother    Hypertension Father    Heart attack Brother     Social History   Tobacco Use   Smoking status: Every Day    Current packs/day: 0.00    Average packs/day: 0.5 packs/day for 40.4 years (20.2 ttl pk-yrs)    Types: Cigarettes    Start date: 07/21/1978    Last attempt to quit: 12/2018    Years since quitting: 5.3    Passive exposure: Never   Smokeless tobacco: Never   Tobacco comments:    Started using cocaine recently but states he will try to quit   Substance Use Topics   Alcohol use: Not Currently    Alcohol/week: 12.0 standard drinks of alcohol    Types: 12 Cans of beer per week    Comment: 4 beers / week now per pt 05/05/20     Current Outpatient Medications:    acetaminophen  (TYLENOL ) 650 MG CR tablet, Take 1,300 mg by mouth every 8 (eight) hours as needed for pain., Disp: , Rfl:    albuterol  (VENTOLIN  HFA) 108 (90 Base) MCG/ACT inhaler, Inhale 2 puffs into the lungs every 6 (six) hours as needed for wheezing or shortness of breath., Disp: 18 g, Rfl: 0   aspirin  EC 81 MG tablet, Take 1 tablet (81 mg total) by mouth daily., Disp: 90 tablet, Rfl: 3   atorvastatin  (LIPITOR) 40 MG tablet, Take 1 tablet (40 mg total) by mouth daily., Disp: 90 tablet, Rfl: 1   baclofen  (LIORESAL ) 10 MG tablet, Take 1 tablet (10 mg total) by mouth 3 (three) times daily., Disp: 30 each, Rfl: 0   clopidogrel  (PLAVIX ) 75 MG tablet, Take 1 tablet (75 mg total) by mouth daily.,  Disp: 90 tablet, Rfl: 1   gabapentin  (NEURONTIN ) 100 MG capsule, Take 1 capsule (100 mg total) by mouth 3 (three) times daily., Disp: 270 capsule, Rfl: 1   lidocaine  (LIDODERM ) 5 %, Place 1 patch onto the skin daily. Remove & Discard patch within 12 hours or as directed by MD, Disp: 30 patch, Rfl: 0   nebivolol  (BYSTOLIC ) 2.5 MG tablet, Take 1 tablet (2.5 mg total) by mouth daily., Disp: 30 tablet, Rfl: 1   tadalafil  (CIALIS ) 20 MG tablet, Take 0.5-1 tablets (10-20 mg total) by mouth every other day as needed for erectile dysfunction., Disp: 10 tablet, Rfl: 2   Thiamine  HCl (VITAMIN B-1) 250 MG tablet, Take 250 mg by mouth every other day., Disp: , Rfl:    traZODone  (DESYREL ) 100 MG tablet, TAKE 1 TABLET BY MOUTH AT BEDTIME, Disp: 90 tablet, Rfl: 0  TRELEGY ELLIPTA  100-62.5-25 MCG/ACT AEPB, INHALE 1 PUFF ONCE DAILY, Disp: 180 each, Rfl: 0   valsartan  (DIOVAN ) 320 MG tablet, Take 1 tablet (320 mg total) by mouth daily. BP and kidney disease, Disp: 90 tablet, Rfl: 1   vitamin B-12 (CYANOCOBALAMIN ) 500 MCG tablet, Take 500 mcg by mouth every other day., Disp: , Rfl:   Allergies  Allergen Reactions   Levaquin [Levofloxacin]     Avoid quinolones due to dilated aorta    Quinolones     Avoid quinolones due to dilated aorta     I personally reviewed active problem list, medication list, allergies with the patient/caregiver today.   ROS  Ten systems reviewed and is negative except as mentioned in HPI    Objective Physical Exam CONSTITUTIONAL: Patient appears well-developed and well-nourished. No distress. HEENT: Head atraumatic, normocephalic, neck supple. CARDIOVASCULAR: Normal rate, regular rhythm and normal heart sounds. No murmur heard. No BLE edema. PULMONARY: Effort normal. Rhonchi present on auscultation. No respiratory distress. ABDOMINAL: There is no tenderness or distention. MUSCULOSKELETAL: Normal gait. Without gross motor or sensory deficit. PSYCHIATRIC: Patient has a normal  mood and affect. Behavior is normal. Judgment and thought content normal.  Vitals:   04/15/24 1024  BP: 136/76  Pulse: 89  Resp: 16  SpO2: 97%  Weight: 141 lb 12.8 oz (64.3 kg)  Height: 5' 9 (1.753 m)    Body mass index is 20.94 kg/m.   PHQ2/9:    04/15/2024   10:22 AM 03/23/2024    1:57 PM 12/24/2023   12:55 PM 10/17/2023   11:45 AM 09/15/2023    2:00 PM  Depression screen PHQ 2/9  Decreased Interest 0 0 0 0 0  Down, Depressed, Hopeless 0 0 0 0 0  PHQ - 2 Score 0 0 0 0 0  Altered sleeping    0 0  Tired, decreased energy    0 0  Change in appetite    0 0  Feeling bad or failure about yourself     0 0  Trouble concentrating    0 0  Moving slowly or fidgety/restless    0 0  Suicidal thoughts    0 0  PHQ-9 Score    0 0  Difficult doing work/chores    Not difficult at all Not difficult at all    phq 9 is negative  Fall Risk:    04/15/2024   10:22 AM 03/23/2024    1:57 PM 12/24/2023   12:55 PM 10/17/2023   11:44 AM 10/15/2023   10:33 AM  Fall Risk   Falls in the past year? 0 0 0 0 0  Number falls in past yr: 0 0 0 0 0  Injury with Fall? 0 0 0 0   Risk for fall due to : No Fall Risks No Fall Risks No Fall Risks No Fall Risks No Fall Risks  Follow up Falls evaluation completed Falls evaluation completed Falls prevention discussed;Falls evaluation completed;Education provided Falls prevention discussed;Falls evaluation completed Falls prevention discussed;Education provided;Falls evaluation completed     Assessment & Plan Peripheral arterial disease/Atherosclerosis aorta  with right below knee amputation and phantom limb pain Phantom limb pain worsened, particularly in toes. Gabapentin  effective. - Increase gabapentin  to 400 mg daily, divided as 100 mg in the morning, 100 mg at midday, and 200 mg at bedtime. - Continue follow-up with vascular surgeon as scheduled.  Chronic kidney disease stage 3A eGFR improved to 51 from 31 in May 2023. Blood pressure control crucial.  NSAIDs avoidance necessary. - Increase Bystolic  from 2.5 mg to 5 mg daily. - Avoid NSAIDs such as Aleve and Motrin. - Continue valsartan  320 mg daily.  Hypertension with CKI stage 3A Blood pressure 136/76 mmHg. Target 120/70 mmHg. On valsartan  and Bystolic . - Increase Bystolic  from 2.5 mg to 5 mg daily. - Continue valsartan  320 mg daily.  Centrilobular emphysema  with chronic cough and wheezing COPD with chronic cough and wheezing. Trelegy for maintenance. Albuterol  needed for acute relief. - Prescribe albuterol  inhaler for use as needed. - Continue Trelegy as prescribed.  Dyslipidemia Managed with atorvastatin . - Continue atorvastatin  as prescribed.  Superior mesenteric artery stenosis  Alcohol use disorder in remission  Consumes wine two to three times a week. No heavy drinking reported.  Cocaine use disorder, in remission In remission for approximately one year. Continued abstinence encouraged.  Current tobacco use Recent attempts to quit. Encouragement to quit due to impact on COPD and health.

## 2024-04-30 ENCOUNTER — Ambulatory Visit
Admission: RE | Admit: 2024-04-30 | Discharge: 2024-04-30 | Disposition: A | Discharge status: Home / Self Care | Attending: Family Medicine | Admitting: Family Medicine

## 2024-04-30 ENCOUNTER — Ambulatory Visit: Admitting: Family Medicine

## 2024-04-30 ENCOUNTER — Ambulatory Visit
Admission: RE | Admit: 2024-04-30 | Discharge: 2024-04-30 | Disposition: A | Discharge status: Home / Self Care | Source: Ambulatory Visit | Attending: Family Medicine | Admitting: Family Medicine

## 2024-04-30 ENCOUNTER — Encounter: Payer: Self-pay | Admitting: Family Medicine

## 2024-04-30 VITALS — BP 150/94 | HR 78 | Temp 97.6°F | Resp 16 | Ht 69.0 in | Wt 142.0 lb

## 2024-04-30 DIAGNOSIS — N183 Chronic kidney disease, stage 3 unspecified: Secondary | ICD-10-CM

## 2024-04-30 DIAGNOSIS — I129 Hypertensive chronic kidney disease with stage 1 through stage 4 chronic kidney disease, or unspecified chronic kidney disease: Secondary | ICD-10-CM | POA: Diagnosis not present

## 2024-04-30 DIAGNOSIS — J441 Chronic obstructive pulmonary disease with (acute) exacerbation: Secondary | ICD-10-CM | POA: Insufficient documentation

## 2024-04-30 DIAGNOSIS — R0989 Other specified symptoms and signs involving the circulatory and respiratory systems: Secondary | ICD-10-CM

## 2024-04-30 MED ORDER — BENZONATATE 100 MG PO CAPS: 100.0000 mg | ORAL_CAPSULE | Freq: Three times a day (TID) | ORAL | 0 refills | Status: AC | PRN

## 2024-04-30 MED ORDER — PREDNISONE 10 MG PO TABS: 10.0000 mg | ORAL_TABLET | Freq: Every day | ORAL | 0 refills | Status: AC

## 2024-04-30 MED ORDER — AZITHROMYCIN 250 MG PO TABS: ORAL_TABLET | ORAL | 0 refills | Status: AC

## 2024-04-30 NOTE — Progress Notes (Signed)
 Name: Ryan [name]   MRN: [id number]    DOB: [birth year]   Date:04/30/2024       Progress Note  Subjective  Chief Complaint  Chief Complaint  Patient presents with   Cough    On going for almost a week   Nasal Congestion   Headache    Discussed the use of AI scribe software for clinical note transcription with the patient, who gave verbal consent to proceed.  History of Present Illness Ryan [name] is a 68 year old male with COPD who presents with a cold and persistent cough.  He began experiencing symptoms of a cold on Saturday evening, including a persistent and irritating cough. Initially, he self-treated with Alka-Seltzer Cold Plus, later adding Alka-Seltzer Cold and Cough, but symptoms persist.  He has a history of COPD and uses Trelegy 162/25 and albuterol  inhalers. Since the onset of the cold, there is an increase in coughing and slight shortness of breath, though he feels somewhat better than on Saturday. He has difficulty expectorating mucus, stating 'the mucus won't come out.'  No fever or chills are present currently, although he initially experienced some cold symptoms. His appetite was initially poor but has since improved.  He mentions that his blood pressure was 'out of whack' today, which concerned him and his daughter, prompting her to suggest the appointment.    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/01/2023   History of colonic polyps 08/08/2023   Polyp of ascending colon 08/08/2023   Cocaine use disorder, mild, in early remission (HCC) 08/27/2022   Alcoholism in remission (HCC) 01/24/2022   History of amputation below knee, right (HCC) 01/24/2022   Moderate protein malnutrition 01/24/2022   Benign hypertension with chronic kidney disease, stage III (HCC) 01/24/2022   Aneurysm of ascending aorta without rupture 01/24/2022   Stage 3a chronic kidney disease (HCC) 01/24/2022   Phantom pain after  amputation of lower extremity (HCC)    Below-knee amputation of right lower extremity (HCC) 05/17/2020   PAD (peripheral artery disease) 07/21/2018   B12 deficiency 03/12/2018   Vitamin B1 deficiency 03/12/2018   Varicose veins of leg with pain, right 03/12/2018   Enlarged thoracic aorta 02/24/2018   Paresthesia 02/24/2018   Centrilobular emphysema (HCC) 02/20/2018   Atherosclerosis of aorta 02/20/2018   Hypertension, benign 08/31/2015   Tobacco use 08/31/2015    Social History   Tobacco Use   Smoking status: Every Day    Current packs/day: 0.00    Average packs/day: 0.5 packs/day for 40.4 years (20.2 ttl pk-yrs)    Types: Cigarettes    Start date: 07/21/1978    Last attempt to quit: 12/2018    Years since quitting: 5.3    Passive exposure: Never   Smokeless tobacco: Never   Tobacco comments:    Started using cocaine recently but states he will try to quit   Substance Use Topics   Alcohol use: Not Currently    Alcohol/week: 12.0 standard drinks of alcohol    Types: 12 Cans of beer per week    Comment: 4 beers / week now per pt 05/05/20     Current Outpatient Medications:    acetaminophen  (TYLENOL ) 650 MG CR tablet, Take 1,300 mg by mouth every 8 (eight) hours as needed for pain., Disp: , Rfl:    albuterol  (VENTOLIN  HFA) 108 (90 Base) MCG/ACT inhaler, Inhale 2 puffs into the lungs every 6 (six) hours as needed for wheezing  or shortness of breath., Disp: 18 g, Rfl: 0   aspirin  EC 81 MG tablet, Take 1 tablet (81 mg total) by mouth daily., Disp: 90 tablet, Rfl: 3   atorvastatin  (LIPITOR) 40 MG tablet, Take 1 tablet (40 mg total) by mouth daily., Disp: 90 tablet, Rfl: 1   clopidogrel  (PLAVIX ) 75 MG tablet, Take 1 tablet (75 mg total) by mouth daily., Disp: 90 tablet, Rfl: 1   gabapentin  (NEURONTIN ) 100 MG capsule, Take 1 capsule (100 mg total) by mouth 4 (four) times daily., Disp: 360 capsule, Rfl: 1   nebivolol  (BYSTOLIC ) 5 MG tablet, Take 1 tablet (5 mg total) by mouth daily.,  Disp: 90 tablet, Rfl: 1   tadalafil  (CIALIS ) 20 MG tablet, Take 0.5-1 tablets (10-20 mg total) by mouth every other day as needed for erectile dysfunction., Disp: 10 tablet, Rfl: 2   Thiamine  HCl (VITAMIN B-1) 250 MG tablet, Take 250 mg by mouth every other day., Disp: , Rfl:    traZODone  (DESYREL ) 100 MG tablet, TAKE 1 TABLET BY MOUTH AT BEDTIME, Disp: 90 tablet, Rfl: 0   TRELEGY ELLIPTA  100-62.5-25 MCG/ACT AEPB, INHALE 1 PUFF ONCE DAILY, Disp: 180 each, Rfl: 0   valsartan  (DIOVAN ) 320 MG tablet, Take 1 tablet (320 mg total) by mouth daily. BP and kidney disease, Disp: 90 tablet, Rfl: 1   vitamin B-12 (CYANOCOBALAMIN ) 500 MCG tablet, Take 500 mcg by mouth every other day., Disp: , Rfl:    azithromycin  (ZITHROMAX ) 250 MG tablet, Take 2 tablets on day 1, then 1 tablet daily on days 2 through 5, Disp: 6 tablet, Rfl: 0   benzonatate  (TESSALON ) 100 MG capsule, Take 1 capsule (100 mg total) by mouth 3 (three) times daily as needed for cough., Disp: 40 capsule, Rfl: 0   predniSONE  (DELTASONE ) 10 MG tablet, Take 1 tablet (10 mg total) by mouth daily with breakfast., Disp: 10 tablet, Rfl: 0  Allergies  Allergen Reactions   Levaquin [Levofloxacin]     Avoid quinolones due to dilated aorta    Quinolones     Avoid quinolones due to dilated aorta     ROS  Ten systems reviewed and is negative except as mentioned in HPI    Objective  Vitals:   04/30/24 1122 04/30/24 1155  BP: (!) 152/92 (!) 150/94  Pulse: 78   Resp: 16   Temp: 97.6 F (36.4 C)   TempSrc: Oral   SpO2: 100%   Weight: 142 lb (64.4 kg)   Height: 5' 9 (1.753 m)     Body mass index is 20.97 kg/m.    Physical Exam CONSTITUTIONAL: Patient appears well-developed and well-nourished. No distress. HEENT: Head atraumatic, normocephalic, neck supple. Ears and throat normal. CARDIOVASCULAR: Normal rate, regular rhythm and normal heart sounds. No murmur heard. No BLE edema. PULMONARY: Effort normal. Bronchi heard in right posterior  lower lung base. No respiratory distress. MUSCULOSKELETAL: prosthesis , walks well  PSYCHIATRIC: Patient has a normal mood and affect. Behavior is normal. Judgment and thought content normal.   Assessment & Plan COPD with acute exacerbation Acute exacerbation likely due to upper respiratory infection. Differential includes bronchitis and pneumonia. - Prescribed prednisone  for inflammation. - Prescribed azithromycin  for bacterial infection. - Ordered chest x-ray to differentiate bronchitis from pneumonia. - Prescribed Tessalon  Perles for cough. - Recommended Coricidin HBP for congestion.  Hypertension associated with CKI stage 3 - remind him to only take coricidin hbp for cold symptoms - monitor bp at home - take medications

## 2024-05-04 ENCOUNTER — Ambulatory Visit: Payer: Self-pay | Admitting: Family Medicine

## 2024-05-07 ENCOUNTER — Other Ambulatory Visit: Payer: Self-pay | Admitting: Family Medicine

## 2024-05-07 DIAGNOSIS — N529 Male erectile dysfunction, unspecified: Secondary | ICD-10-CM
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
## 2024-05-27 ENCOUNTER — Ambulatory Visit
Admission: RE | Admit: 2024-05-27 | Discharge: 2024-05-27 | Disposition: A | Discharge status: Home / Self Care | Source: Ambulatory Visit | Attending: Family Medicine | Admitting: Family Medicine

## 2024-05-27 DIAGNOSIS — F17210 Nicotine dependence, cigarettes, uncomplicated: Secondary | ICD-10-CM | POA: Diagnosis not present

## 2024-05-27 DIAGNOSIS — I700 Atherosclerosis of aorta: Secondary | ICD-10-CM | POA: Insufficient documentation

## 2024-05-27 DIAGNOSIS — J432 Centrilobular emphysema: Secondary | ICD-10-CM | POA: Insufficient documentation

## 2024-05-27 DIAGNOSIS — I7121 Aneurysm of the ascending aorta, without rupture: Secondary | ICD-10-CM | POA: Insufficient documentation

## 2024-05-27 DIAGNOSIS — Z122 Encounter for screening for malignant neoplasm of respiratory organs: Secondary | ICD-10-CM | POA: Insufficient documentation

## 2024-05-30 ENCOUNTER — Other Ambulatory Visit: Payer: Self-pay | Admitting: Family Medicine
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
## 2024-06-25 ENCOUNTER — Other Ambulatory Visit: Payer: Self-pay | Admitting: Family Medicine

## 2024-06-25 DIAGNOSIS — G4709 Other insomnia: Secondary | ICD-10-CM
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
## 2024-07-07 ENCOUNTER — Ambulatory Visit (HOSPITAL_BASED_OUTPATIENT_CLINIC_OR_DEPARTMENT_OTHER)
Admission: RE | Admit: 2024-07-07 | Discharge: 2024-07-07 | Disposition: A | Discharge status: Home / Self Care | Source: Ambulatory Visit | Attending: Surgery | Admitting: Surgery

## 2024-07-07 ENCOUNTER — Ambulatory Visit (INDEPENDENT_AMBULATORY_CARE_PROVIDER_SITE_OTHER): Admitting: Physician Assistant

## 2024-07-07 ENCOUNTER — Ambulatory Visit (HOSPITAL_COMMUNITY)
Admission: RE | Admit: 2024-07-07 | Discharge: 2024-07-07 | Disposition: A | Discharge status: Home / Self Care | Source: Ambulatory Visit | Attending: Surgery | Admitting: Surgery

## 2024-07-07 VITALS — BP 155/96 | HR 60 | Temp 97.7°F | Wt 144.2 lb

## 2024-07-07 DIAGNOSIS — I739 Peripheral vascular disease, unspecified: Secondary | ICD-10-CM

## 2024-07-07 DIAGNOSIS — T82858A Stenosis of vascular prosthetic devices, implants and grafts, initial encounter: Secondary | ICD-10-CM

## 2024-07-07 DIAGNOSIS — I70212 Atherosclerosis of native arteries of extremities with intermittent claudication, left leg: Secondary | ICD-10-CM

## 2024-07-07 DIAGNOSIS — F17200 Nicotine dependence, unspecified, uncomplicated: Secondary | ICD-10-CM

## 2024-07-07 LAB — VAS US ABI WITH/WO TBI: Left ABI: 1.09

## 2024-07-07 NOTE — Progress Notes (Signed)
 " HISTORY AND PHYSICAL     CC:  follow up. Requesting Provider:  Sowles, Krichna, MD  HPI: This is a 69 y.o. male who is here today for follow up for PAD.  Pt has hx of multiple right lower extremity procedures that have been endovascular. This includes stenting of his entire SFA and popliteal. Most recently he underwent lysis with penumbra of the below-knee pop. This was done in August and [place]. Subsequently had a similar procedure by interventional radiology at [facility] in the past.  On 09/14/2019, he underwent extensive right EIA, CFA and profunda femoral endarterectomy with vein patch angioplasty, right CFA to ATA with composite ringed PTFE and reverse GSV distally, right CIA EIA stenting by Dr. Sheree.  On 04/04/2020 underwent stenting of the left EIA by Dr. Sheree.  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 Sheree.   On 12/01/2023 he underwent stent to the left SFA and drug coated balloon angioplasty of left EIA in stent stenosis by Dr. Sheree.   Pt was last seen 12/31/2023 and at that time, his claudication sx had resolved.  He was walking with his prosthesis.  He was smoking about 4-5 cigarettes per day.    The pt returns today for follow up.  He states he is doing well.  He denies any claudication, rest pain or non healing wounds on the left foot.  He states that he is having increased phantom pains in the right leg today but most of the time it is well controlled.  He states he gets some groin pain in the left groin when he coughs.  He has hx of hernia on the right in the distant past.    He continues to smoke.  He states he quit smoking when Dr. Sheree worked on the right leg but started back.    The pt is on a statin for cholesterol management.    The pt is on an aspirin .    Other AC:  Plavix  The pt is on BB, ARB for hypertension.  The pt is not on diabetic medication. Tobacco hx:  current   Past Medical History:  Diagnosis Date   CKD (chronic kidney disease)    History of blood transfusion     Hyperlipidemia    Hypertension    Neuromuscular disorder (HCC)    numbness feet   Peripheral vascular disease    Shortness of breath dyspnea    occasional     Past Surgical History:  Procedure Laterality Date   ABDOMINAL AORTOGRAM W/LOWER EXTREMITY N/A 09/13/2019   Procedure: ABDOMINAL AORTOGRAM W/LOWER EXTREMITY;  Surgeon: Sheree Penne Bruckner, MD;  Location: [facility] INVASIVE CV LAB;  Service: Cardiovascular;  Laterality: N/A;   ABDOMINAL AORTOGRAM W/LOWER EXTREMITY N/A 12/01/2023   Procedure: ABDOMINAL AORTOGRAM W/LOWER EXTREMITY;  Surgeon: Sheree Penne Bruckner, MD;  Location: [facility] INVASIVE CV LAB;  Service: Cardiovascular;  Laterality: N/A;   AMPUTATION Right 05/09/2020   Procedure: RIGHT BELOW KNEE AMPUTATION;  Surgeon: Sheree Penne Bruckner, MD;  Location: [facility] OR;  Service: Vascular;  Laterality: Right;  w/ a block   COLONOSCOPY WITH PROPOFOL  N/A 02/15/2016   Procedure: COLONOSCOPY WITH PROPOFOL ;  Surgeon: Rogelia Copping, MD;  Location: [facility] SURGERY CNTR;  Service: Endoscopy;  Laterality: N/A;   COLONOSCOPY WITH PROPOFOL  N/A 08/08/2023   Procedure: COLONOSCOPY WITH PROPOFOL ;  Surgeon: Copping Rogelia, MD;  Location: [facility] ENDOSCOPY;  Service: Endoscopy;  Laterality: N/A;   FEMORAL-POPLITEAL BYPASS GRAFT Right 09/14/2019   Procedure: BYPASS GRAFT FEMORAL-POPLITEAL ARTERY;  Surgeon: Sheree,  Penne Bruckner, MD;  Location: [facility] OR;  Service: Vascular;  Laterality: Right;   HERNIA REPAIR  1999   left inguinal   INSERTION OF ILIAC STENT Right 09/14/2019   Procedure: Insertion Of Common and External Iliac Stent;  Surgeon: Sheree Penne Bruckner, MD;  Location: [facility] OR;  Service: Vascular;  Laterality: Right;   LOWER EXTREMITY ANGIOGRAPHY Right 05/07/2018   Procedure: LOWER EXTREMITY ANGIOGRAPHY;  Surgeon: Marea Selinda RAMAN, MD;  Location: [facility] INVASIVE CV LAB;  Service: Cardiovascular;  Laterality: Right;   LOWER EXTREMITY ANGIOGRAPHY Right 06/25/2018   Procedure: LOWER EXTREMITY ANGIOGRAPHY;   Surgeon: Marea Selinda RAMAN, MD;  Location: [facility] INVASIVE CV LAB;  Service: Cardiovascular;  Laterality: Right;   LOWER EXTREMITY ANGIOGRAPHY Right 07/01/2018   Procedure: LOWER EXTREMITY ANGIOGRAPHY;  Surgeon: Marea Selinda RAMAN, MD;  Location: [facility] INVASIVE CV LAB;  Service: Cardiovascular;  Laterality: Right;   LOWER EXTREMITY ANGIOGRAPHY Right 08/12/2018   Procedure: LOWER EXTREMITY ANGIOGRAPHY;  Surgeon: Marea Selinda RAMAN, MD;  Location: [facility] INVASIVE CV LAB;  Service: Cardiovascular;  Laterality: Right;   LOWER EXTREMITY ANGIOGRAPHY Right 09/03/2018   Procedure: LOWER EXTREMITY ANGIOGRAPHY;  Surgeon: Marea Selinda RAMAN, MD;  Location: [facility] INVASIVE CV LAB;  Service: Cardiovascular;  Laterality: Right;   LOWER EXTREMITY ANGIOGRAPHY Right 09/04/2018   Procedure: Lower Extremity Angiography;  Surgeon: Marea Selinda RAMAN, MD;  Location: [facility] INVASIVE CV LAB;  Service: Cardiovascular;  Laterality: Right;   LOWER EXTREMITY ANGIOGRAPHY Right 10/01/2018   Procedure: LOWER EXTREMITY ANGIOGRAPHY;  Surgeon: Marea Selinda RAMAN, MD;  Location: [facility] INVASIVE CV LAB;  Service: Cardiovascular;  Laterality: Right;   LOWER EXTREMITY ANGIOGRAPHY Right 10/01/2018   Procedure: Lower Extremity Angiography;  Surgeon: Marea Selinda RAMAN, MD;  Location: [facility] INVASIVE CV LAB;  Service: Cardiovascular;  Laterality: Right;   LOWER EXTREMITY ANGIOGRAPHY Right 10/15/2018   Procedure: LOWER EXTREMITY ANGIOGRAPHY;  Surgeon: Marea Selinda RAMAN, MD;  Location: [facility] INVASIVE CV LAB;  Service: Cardiovascular;  Laterality: Right;   LOWER EXTREMITY ANGIOGRAPHY Left 10/16/2018   Procedure: Lower Extremity Angiography;  Surgeon: Marea Selinda RAMAN, MD;  Location: [facility] INVASIVE CV LAB;  Service: Cardiovascular;  Laterality: Left;   LOWER EXTREMITY ANGIOGRAPHY Left 01/20/2019   Procedure: LOWER EXTREMITY ANGIOGRAPHY;  Surgeon: Marea Selinda RAMAN, MD;  Location: [facility] INVASIVE CV LAB;  Service: Cardiovascular;  Laterality: Left;   LOWER EXTREMITY ANGIOGRAPHY Right 01/21/2019   Procedure: Lower Extremity  Angiography;  Surgeon: Marea Selinda RAMAN, MD;  Location: [facility] INVASIVE CV LAB;  Service: Cardiovascular;  Laterality: Right;   LOWER EXTREMITY ANGIOGRAPHY Right 01/28/2019   Procedure: LOWER EXTREMITY ANGIOGRAPHY;  Surgeon: Marea Selinda RAMAN, MD;  Location: [facility] INVASIVE CV LAB;  Service: Cardiovascular;  Laterality: Right;   LOWER EXTREMITY ANGIOGRAPHY Right 01/29/2019   Procedure: Lower Extremity Angiography;  Surgeon: Marea Selinda RAMAN, MD;  Location: [facility] INVASIVE CV LAB;  Service: Cardiovascular;  Laterality: Right;   LOWER EXTREMITY ANGIOGRAPHY Right 04/04/2020   Procedure: LOWER EXTREMITY ANGIOGRAPHY;  Surgeon: Sheree Penne Bruckner, MD;  Location: [facility] INVASIVE CV LAB;  Service: Cardiovascular;  Laterality: Right;   LOWER EXTREMITY ANGIOGRAPHY N/A 12/01/2023   Procedure: Lower Extremity Angiography;  Surgeon: Sheree Penne Bruckner, MD;  Location: [facility] INVASIVE CV LAB;  Service: Cardiovascular;  Laterality: N/A;   LOWER EXTREMITY INTERVENTION Right 07/02/2018   Procedure: LOWER EXTREMITY INTERVENTION;  Surgeon: Marea Selinda RAMAN, MD;  Location: [facility] INVASIVE CV LAB;  Service: Cardiovascular;  Laterality: Right;   LOWER EXTREMITY INTERVENTION N/A 12/01/2023   Procedure: LOWER EXTREMITY INTERVENTION;  Surgeon: Sheree Penne Bruckner, MD;  Location: [facility] INVASIVE CV LAB;  Service: Cardiovascular;  Laterality: N/A;   PERIPHERAL VASCULAR BALLOON ANGIOPLASTY Left 04/04/2020   Procedure: PERIPHERAL VASCULAR BALLOON ANGIOPLASTY;  Surgeon: Sheree Penne Bruckner, MD;  Location: [facility] INVASIVE CV LAB;  Service: Cardiovascular;  Laterality: Left;  external iliac   POLYPECTOMY N/A 02/15/2016   Procedure: POLYPECTOMY;  Surgeon: Rogelia Copping, MD;  Location: [facility] SURGERY CNTR;  Service: Endoscopy;  Laterality: N/A;   POLYPECTOMY  08/08/2023   Procedure: POLYPECTOMY INTESTINAL;  Surgeon: Copping Rogelia, MD;  Location: [facility] ENDOSCOPY;  Service: Endoscopy;;    Allergies[1]  Current Outpatient Medications  Medication Sig Dispense  Refill   acetaminophen  (TYLENOL ) 650 MG CR tablet Take 1,300 mg by mouth every 8 (eight) hours as needed for pain.     albuterol  (VENTOLIN  HFA) 108 (90 Base) MCG/ACT inhaler INHALE 2 PUFFS BY MOUTH EVERY 6 HOURS AS NEEDED FOR WHEEZING FOR SHORTNESS OF BREATH 18 g 5   aspirin  EC 81 MG tablet Take 1 tablet (81 mg total) by mouth daily. 90 tablet 3   atorvastatin  (LIPITOR) 40 MG tablet Take 1 tablet (40 mg total) by mouth daily. 90 tablet 1   benzonatate  (TESSALON ) 100 MG capsule Take 1 capsule (100 mg total) by mouth 3 (three) times daily as needed for cough. 40 capsule 0   clopidogrel  (PLAVIX ) 75 MG tablet Take 1 tablet (75 mg total) by mouth daily. 90 tablet 1   gabapentin  (NEURONTIN ) 100 MG capsule Take 1 capsule (100 mg total) by mouth 4 (four) times daily. 360 capsule 1   nebivolol  (BYSTOLIC ) 5 MG tablet Take 1 tablet (5 mg total) by mouth daily. 90 tablet 1   predniSONE  (DELTASONE ) 10 MG tablet Take 1 tablet (10 mg total) by mouth daily with breakfast. 10 tablet 0   tadalafil  (CIALIS ) 20 MG tablet TAKE 1/2 TO 1 (ONE-HALF TO ONE) TABLET BY MOUTH EVERY OTHER DAY AS NEEDED FOR ERECTILE DYSFUNCTION 10 tablet 0   Thiamine  HCl (VITAMIN B-1) 250 MG tablet Take 250 mg by mouth every other day.     traZODone  (DESYREL ) 100 MG tablet TAKE 1 TABLET BY MOUTH AT BEDTIME 90 tablet 1   TRELEGY ELLIPTA  100-62.5-25 MCG/ACT AEPB INHALE 1 PUFF ONCE DAILY 180 each 0   valsartan  (DIOVAN ) 320 MG tablet Take 1 tablet (320 mg total) by mouth daily. BP and kidney disease 90 tablet 1   vitamin B-12 (CYANOCOBALAMIN ) 500 MCG tablet Take 500 mcg by mouth every other day.     No current facility-administered medications for this visit.    Family History  Problem Relation Age of Onset   COPD Mother    Hypertension Father    Heart attack Brother     Social History   Socioeconomic History   Marital status: Single    Spouse name: [name]   Number of children: 5   Years of education: Not on file   Highest education  level: High school graduate  Occupational History   Occupation: Forklift    Comment: [employer]  Tobacco Use   Smoking status: Every Day    Current packs/day: 0.00    Average packs/day: 0.5 packs/day for 40.4 years (20.2 ttl pk-yrs)    Types: Cigarettes    Start date: 07/21/1978    Last attempt to quit: 12/2018    Years since quitting: 5.5    Passive exposure: Never   Smokeless tobacco: Never   Tobacco comments:    Started using cocaine recently but  states he will try to quit   Vaping Use   Vaping status: Never Used  Substance and Sexual Activity   Alcohol use: Not Currently    Alcohol/week: 12.0 standard drinks of alcohol    Types: 12 Cans of beer per week    Comment: 4 beers / week now per pt 05/05/20   Drug use: Not Currently    Frequency: 2.0 times per week    Types: Cocaine    Comment: sept 2024   Sexual activity: Yes    Partners: Female    Birth control/protection: None  Other Topics Concern   Not on file  Social History Narrative   Not on file   Social Drivers of Health   Tobacco Use: High Risk (04/30/2024)   Patient History    Smoking Tobacco Use: Every Day    Smokeless Tobacco Use: Never    Passive Exposure: Never  Financial Resource Strain: Low Risk (10/17/2023)   Overall Financial Resource Strain (CARDIA)    Difficulty of Paying Living Expenses: Not hard at all  Food Insecurity: No Food Insecurity (10/17/2023)   Hunger Vital Sign    Worried About Running Out of Food in the Last Year: Never true    Ran Out of Food in the Last Year: Never true  Transportation Needs: No Transportation Needs (10/17/2023)   PRAPARE - Administrator, Civil Service (Medical): No    Lack of Transportation (Non-Medical): No  Physical Activity: Sufficiently Active (10/17/2023)   Exercise Vital Sign    Days of Exercise per Week: 3 days    Minutes of Exercise per Session: 60 min  Stress: No Stress Concern Present (10/17/2023)   [employer] of Occupational Health -  Occupational Stress Questionnaire    Feeling of Stress : Not at all  Social Connections: Moderately Isolated (10/17/2023)   Social Connection and Isolation Panel    Frequency of Communication with Friends and Family: Three times a week    Frequency of Social Gatherings with Friends and Family: Twice a week    Attends Religious Services: Never    Database Administrator or Organizations: No    Attends Banker Meetings: Never    Marital Status: Married  Catering Manager Violence: Not At Risk (10/17/2023)   Humiliation, Afraid, Rape, and Kick questionnaire    Fear of Current or Ex-Partner: No    Emotionally Abused: No    Physically Abused: No    Sexually Abused: No  Depression (PHQ2-9): Low Risk (04/30/2024)   Depression (PHQ2-9)    PHQ-2 Score: 0  Alcohol Screen: Low Risk (10/17/2023)   Alcohol Screen    Last Alcohol Screening Score (AUDIT): 2  Housing: Low Risk (10/17/2023)   Housing Stability Vital Sign    Unable to Pay for Housing in the Last Year: No    Number of Times Moved in the Last Year: 0    Homeless in the Last Year: No  Utilities: Not At Risk (10/17/2023)   AHC Utilities    Threatened with loss of utilities: No  Health Literacy: Not on file     REVIEW OF SYSTEMS:   [X]  denotes positive finding, [ ]  denotes negative finding Cardiac  Comments:  Chest pain or chest pressure:    Shortness of breath upon exertion:    Short of breath when lying flat:    Irregular heart rhythm:        Vascular    Pain in calf, thigh, or hip brought on by  ambulation:    Pain in feet at night that wakes you up from your sleep:     Blood clot in your veins:    Leg swelling:         Pulmonary    Oxygen at home:    Productive cough:     Wheezing:         Neurologic    Sudden weakness in arms or legs:     Sudden numbness in arms or legs:     Sudden onset of difficulty speaking or slurred speech:    Temporary loss of vision in one eye:     Problems with dizziness:          Gastrointestinal    Blood in stool:     Vomited blood:         Genitourinary    Burning when urinating:     Blood in urine:        Psychiatric    Major depression:         Hematologic    Bleeding problems:    Problems with blood clotting too easily:        Skin    Rashes or ulcers:        Constitutional    Fever or chills:      PHYSICAL EXAMINATION:  Today's Vitals   07/07/24 1021  BP: (!) 155/96  Pulse: 60  Temp: 97.7 F (36.5 C)  TempSrc: Temporal  Weight: 144 lb 3.2 oz (65.4 kg)  PainSc: 0-No pain   Body mass index is 21.29 kg/m.   General:  WDWN in NAD; vital signs documented above Gait: Not observed HENT: WNL, normocephalic Pulmonary: normal non-labored breathing , without wheezing Cardiac: regular HR, without carotid bruits Abdomen: soft, NT; aortic pulse is not palpable Skin: without rashes Vascular Exam/Pulses:  Right Left  Radial 2+ (normal) 2+ (normal)  Femoral 2+ (normal) 2+ (normal)  DP BKA 2+ (normal)   Extremities: without ischemic changes, without Gangrene , without cellulitis; without open wounds; left BKA looks good.  Musculoskeletal: no muscle wasting or atrophy  Neurologic: A&O X 3 Psychiatric:  The pt has Normal affect.   Non-Invasive Vascular Imaging:   ABI's/TBI's on 07/07/2024: Right:  BKA Left:  1.09/0.62 - Great toe pressure: 91  Arterial duplex on 07/07/2024: +-----------+--------+-----+--------+---------+--------+  LEFT      PSV cm/sRatioStenosisWaveform Comments  +-----------+--------+-----+--------+---------+--------+  CFA Distal 201                  biphasic           +-----------+--------+-----+--------+---------+--------+  DFA       40                   triphasic          +-----------+--------+-----+--------+---------+--------+  SFA Prox   105                  biphasic           +-----------+--------+-----+--------+---------+--------+  SFA Distal 77                   triphasic           +-----------+--------+-----+--------+---------+--------+  POP Prox   65                   triphasic          +-----------+--------+-----+--------+---------+--------+  POP Distal 68  triphasic          +-----------+--------+-----+--------+---------+--------+  ATA Distal 66                   triphasic          +-----------+--------+-----+--------+---------+--------+  PTA Distal 51                   triphasic          +-----------+--------+-----+--------+---------+--------+  PERO Distal63                   triphasic          +-----------+--------+-----+--------+---------+--------+     Left Stent(s):  +---------------+--------+--------+---------+--------+  SFA           PSV cm/sStenosisWaveform Comments  +---------------+--------+--------+---------+--------+  Prox to Stent  110             triphasic          +---------------+--------+--------+---------+--------+  Proximal Stent 90              biphasic           +---------------+--------+--------+---------+--------+  Mid Stent      48              biphasic           +---------------+--------+--------+---------+--------+  Distal Stent   46              triphasic          +---------------+--------+--------+---------+--------+  Distal to Stent77              triphasic          +---------------+--------+--------+---------+--------+   Summary:  Left: Patent stent with no evidence of stenosis in the superficial femoral artery   IVC/Iliac Findings:  +----------+------+--------+--------+    IVC    PatentThrombusComments  +----------+------+--------+--------+  IVC Distalpatent                  +----------+------+--------+--------+   Right Stent(s):  +---------------+--------+--------+--------+--------+  EIA           PSV cm/sStenosisWaveformComments  +---------------+--------+--------+--------+--------+  Prox to Stent  55               biphasic          +---------------+--------+--------+--------+--------+  Proximal Stent 89              biphasic          +---------------+--------+--------+--------+--------+  Mid Stent      89              biphasic          +---------------+--------+--------+--------+--------+  Distal Stent   128             biphasic          +---------------+--------+--------+--------+--------+  Distal to Stent128             biphasic          +---------------+--------+--------+--------+--------+   +---------------+--------+--------+----------+--------+  CIA           PSV cm/sStenosisWaveform  Comments  +---------------+--------+--------+----------+--------+  Prox to Stent  68              biphasic            +---------------+--------+--------+----------+--------+  Proximal Stent 138             biphasic            +---------------+--------+--------+----------+--------+  Mid Stent      48  monophasic          +---------------+--------+--------+----------+--------+  Distal Stent   76              monophasic          +---------------+--------+--------+----------+--------+  Distal to Stent55              biphasic            +---------------+--------+--------+----------+--------+   Left Stent(s):  +---------------+--------+--------+--------+--------+  EIA           PSV cm/sStenosisWaveformComments  +---------------+--------+--------+--------+--------+  Prox to Stent  61              biphasic          +---------------+--------+--------+--------+--------+  Proximal Stent 122             biphasic          +---------------+--------+--------+--------+--------+  Mid Stent      114             biphasic          +---------------+--------+--------+--------+--------+  Distal Stent   205             biphasic          +---------------+--------+--------+--------+--------+  Distal to Duzwu604              biphasic          +---------------+--------+--------+--------+--------+   Summary:  Abdominal Aorta: The largest aortic measurement is 1.8 cm.  Stenosis: +--------------------+-------------+---------------+  Location            Stenosis     Stent            +--------------------+-------------+---------------+  Right Common Iliac               1-49% stenosis   +--------------------+-------------+---------------+  Left Common Iliac   <50% stenosis                 +--------------------+-------------+---------------+  Right External Iliac             1-49% stenosis   +--------------------+-------------+---------------+  Left External Iliac              50-99% stenosis  +--------------------+-------------+---------------+   Patent right CIA and EIA stents with < 50% stenosis.  Patent left EIA stent with outflow stenosis >50%.   Previous ABI's/TBI's on 12/31/2023: Right:  BKA  Left:  1.0/0.59 - Great toe pressure:  107  Previous arterial duplex on 12/31/2023: +----------+--------+-----+--------+--------+------------+  LEFT     PSV cm/sRatioStenosisWaveformComments      +----------+--------+-----+--------+--------+------------+  CFA Distal166                  biphasic              +----------+--------+-----+--------+--------+------------+  DFA      106                  biphasic              +----------+--------+-----+--------+--------+------------+  SFA Prox  104                  biphasic              +----------+--------+-----+--------+--------+------------+  SFA Mid   144                  biphasicstent inflow  +----------+--------+-----+--------+--------+------------+  SFA Distal93                   biphasic              +----------+--------+-----+--------+--------+------------+  Left Stent(s):  +---------------+--------+---------------+--------+-----------------------+  EIA           PSV cm/sStenosis        WaveformComments                +---------------+--------+---------------+--------+-----------------------+  Prox to Stent  146                                                    +---------------+--------+---------------+--------+-----------------------+  Proximal Stent 170                                                    +---------------+--------+---------------+--------+-----------------------+  Mid Stent      247     50-99% stenosis         disease not appreciated  +---------------+--------+---------------+--------+-----------------------+  Distal Stent   312     50-99% stenosis         disease not appreciated  +---------------+--------+---------------+--------+-----------------------+  Distal to Stent367     50-99% stenosis         disease not appreciated  +---------------+--------+---------------+--------+-----------------------+   +---------------+--------+--------+--------+--------+  SFA           PSV cm/sStenosisWaveformComments  +---------------+--------+--------+--------+--------+  Prox to Stent  144             biphasic          +---------------+--------+--------+--------+--------+  Proximal Stent 89              biphasic          +---------------+--------+--------+--------+--------+  Mid Stent      61              biphasic          +---------------+--------+--------+--------+--------+  Distal Stent   53              biphasic          +---------------+--------+--------+--------+--------+  Distal to Stent93              biphasic          +---------------+--------+--------+--------+--------+     ASSESSMENT/PLAN:: 69 y.o. male here for follow up for PAD with hx of multiple right lower extremity procedures that have been endovascular. This includes stenting of his entire SFA and popliteal. Most recently he underwent lysis with penumbra of the below-knee pop. This was done in [place] and [place]. Subsequently  had a similar procedure by interventional radiology at [facility] in the past.  On 09/14/2019, he underwent extensive right EIA, CFA and profunda femoral endarterectomy with vein patch angioplasty, right CFA to ATA with composite ringed PTFE and reverse GSV distally, right CIA EIA stenting by Dr. Sheree.  On 04/04/2020 underwent stenting of the left EIA by Dr. Sheree.  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 Sheree.   On 12/01/2023 he underwent stent to the left SFA and drug coated balloon angioplasty of left EIA in stent stenosis by Dr. Sheree.    -pt doing well with palpable left DP pulse and without claudication, rest pain or non healing wounds.  His stents are patent.  -Discussed with pt the importance of smoking cessation and that it puts them at increased risk for limb loss, heart attack, stroke, cancers, lung problems.   -  continue asa/statin/plavix  -discussed importance of increased walking daily -discussed above findings with Dr. Sheree and pt will f/u in one year with LLE arterial duplex, ABI and aortoiliac duplex.  He knows to call sooner if he has any issues. -Discussed with pt the importance of smoking cessation and that it puts them at increased risk for limb loss, heart attack, stroke, cancers, lung problems.     Lucie Apt, [facility] [phone]  Clinic MD:   Sheree     [1]  Allergies Allergen Reactions   Levaquin [Levofloxacin]     Avoid quinolones due to dilated aorta    Quinolones     Avoid quinolones due to dilated aorta    "

## 2024-07-14 ENCOUNTER — Encounter: Payer: Self-pay | Admitting: Family Medicine

## 2024-07-14 ENCOUNTER — Ambulatory Visit
Admission: RE | Admit: 2024-07-14 | Discharge: 2024-07-14 | Disposition: A | Discharge status: Home / Self Care | Source: Ambulatory Visit | Attending: Family Medicine | Admitting: Family Medicine

## 2024-07-14 ENCOUNTER — Ambulatory Visit: Admitting: Family Medicine

## 2024-07-14 VITALS — BP 142/88 | HR 68 | Resp 16 | Ht 69.0 in | Wt 143.0 lb

## 2024-07-14 DIAGNOSIS — I129 Hypertensive chronic kidney disease with stage 1 through stage 4 chronic kidney disease, or unspecified chronic kidney disease: Secondary | ICD-10-CM

## 2024-07-14 DIAGNOSIS — N183 Chronic kidney disease, stage 3 unspecified: Secondary | ICD-10-CM

## 2024-07-14 DIAGNOSIS — R1032 Left lower quadrant pain: Secondary | ICD-10-CM | POA: Insufficient documentation

## 2024-07-14 NOTE — Progress Notes (Signed)
" ° °  Acute Office Visit  Subjective:     Patient ID: Ryan [name], male    DOB: 08-03-1955, 69 y.o.   MRN: [id number]  Chief Complaint  Patient presents with   Groin Pain    Upper left leg, 1 week    HPI Patient is in today for complaints of left sided groin pain that he reports started one week ago. He is a new patient to me. He voices left groin pain is exacerbated by coughing. He voices over the last one week the pain has worsened describing the left groin to be sore even without coughing. He voices prior to onset of symptoms he lifted a case of water .  In the area of concern there is palpable lump in the left inguinal region, concerning for hernia. He does endorse tenderness with palpation.    Blood pressure elevated at 142/88. He admits he has not taken his blood pressure medication today. Patient asymptomatic. Denies chest pain, headaches, or dizziness. Discussed importance of medication adherence.   Review of Systems  Cardiovascular:  Negative for chest pain and leg swelling.  Gastrointestinal:  Negative for abdominal pain, constipation, diarrhea, nausea and vomiting.  Genitourinary:  Negative for dysuria, frequency, hematuria and urgency.  Musculoskeletal:  Negative for falls.  Neurological:  Negative for dizziness and headaches.        Objective:    BP (!) 142/88   Pulse 68   Resp 16   Ht 5' 9 (1.753 m)   Wt 143 lb (64.9 kg)   SpO2 100%   BMI 21.12 kg/m  BP Readings from Last 3 Encounters:  07/14/24 (!) 142/88  07/07/24 (!) 155/96  04/30/24 (!) 150/94   Wt Readings from Last 3 Encounters:  07/14/24 143 lb (64.9 kg)  07/07/24 144 lb 3.2 oz (65.4 kg)  04/30/24 142 lb (64.4 kg)        Physical Exam Constitutional:      Appearance: Normal appearance.  Cardiovascular:     Rate and Rhythm: Normal rate and regular rhythm.  Pulmonary:     Effort: Pulmonary effort is normal.     Breath sounds: Normal breath sounds.  Skin:    General: Skin is warm and  dry.  Neurological:     General: No focal deficit present.     Mental Status: He is alert.  Psychiatric:        Mood and Affect: Mood normal.        Behavior: Behavior normal.         Assessment & Plan:   Assessment & Plan Left inguinal pain Left groin pain x 1 week with palpable lump in the inguinal region. He does endorse tenderness with palpation. See HPI for details.   -Ordered STAT ultrasound of the LLE to evaluate palpable left groin lump/ mass.  -Advised patient to avoid heavy lifting or other activities requiring him to strain -Return precautions reviewed including increasing size, discoloration or worsening pain  Orders:   US  LT LOWER EXTREM LTD SOFT TISSUE NON VASCULAR; Future  Benign hypertension with chronic kidney disease, stage III (HCC) HTN uncontrolled ; BP is 142/88 at today's visit   Known CKD3a, last GFR 51 in 09/2023  -Discussed importance of medication adherence -Advised to follow up with PCP for HTN management/ chronic condition management         Return for follow up as scheduled with PCP.  LAYMON LOISE CORE, FNP  "

## 2024-07-14 NOTE — Assessment & Plan Note (Addendum)
 HTN uncontrolled ; BP is 142/88 at today's visit   Known CKD3a, last GFR 51 in 09/2023  -Discussed importance of medication adherence -Advised to follow up with PCP for HTN management/ chronic condition management

## 2024-07-15 ENCOUNTER — Ambulatory Visit: Payer: Self-pay | Admitting: Family Medicine

## 2024-07-21 ENCOUNTER — Ambulatory Visit: Admitting: Family Medicine

## 2024-10-21 ENCOUNTER — Ambulatory Visit: Admitting: Family Medicine

## 2024-11-04 ENCOUNTER — Ambulatory Visit
